# Patient Record
Sex: Female | Born: 1945
Health system: Southern US, Community
[De-identification: ages and names within clinical notes are randomized; demographics above are authoritative.]

## PROBLEM LIST (undated history)

## (undated) DIAGNOSIS — M5030 Other cervical disc degeneration, unspecified cervical region: Secondary | ICD-10-CM

## (undated) DIAGNOSIS — F1010 Alcohol abuse, uncomplicated: Secondary | ICD-10-CM

## (undated) DIAGNOSIS — I700 Atherosclerosis of aorta: Secondary | ICD-10-CM

## (undated) DIAGNOSIS — F411 Generalized anxiety disorder: Secondary | ICD-10-CM

## (undated) DIAGNOSIS — I10 Essential (primary) hypertension: Secondary | ICD-10-CM

## (undated) DIAGNOSIS — N289 Disorder of kidney and ureter, unspecified: Secondary | ICD-10-CM

## (undated) DIAGNOSIS — T7840XA Allergy, unspecified, initial encounter: Secondary | ICD-10-CM

## (undated) DIAGNOSIS — M1990 Unspecified osteoarthritis, unspecified site: Secondary | ICD-10-CM

## (undated) DIAGNOSIS — K289 Gastrojejunal ulcer, unspecified as acute or chronic, without hemorrhage or perforation: Secondary | ICD-10-CM

## (undated) DIAGNOSIS — S37009A Unspecified injury of unspecified kidney, initial encounter: Secondary | ICD-10-CM

## (undated) DIAGNOSIS — F1910 Other psychoactive substance abuse, uncomplicated: Secondary | ICD-10-CM

## (undated) DIAGNOSIS — D509 Iron deficiency anemia, unspecified: Secondary | ICD-10-CM

## (undated) DIAGNOSIS — E039 Hypothyroidism, unspecified: Secondary | ICD-10-CM

## (undated) DIAGNOSIS — K922 Gastrointestinal hemorrhage, unspecified: Secondary | ICD-10-CM

## (undated) DIAGNOSIS — H269 Unspecified cataract: Secondary | ICD-10-CM

## (undated) HISTORY — PX: GASTRIC BYPASS: SHX52

## (undated) HISTORY — DX: Disorder of kidney and ureter, unspecified: N28.9

## (undated) HISTORY — DX: Essential (primary) hypertension: I10

## (undated) HISTORY — DX: Unspecified osteoarthritis, unspecified site: M19.90

## (undated) HISTORY — DX: Alcohol abuse, uncomplicated: F10.10

## (undated) HISTORY — PX: DIAGNOSTIC LAPAROSCOPY: SUR761

## (undated) HISTORY — DX: Generalized anxiety disorder: F41.1

## (undated) HISTORY — DX: Unspecified injury of unspecified kidney, initial encounter: S37.009A

## (undated) HISTORY — DX: Other psychoactive substance abuse, uncomplicated: F19.10

## (undated) HISTORY — DX: Unspecified cataract: H26.9

## (undated) HISTORY — PX: ABDOMINAL HYSTERECTOMY: SHX81

## (undated) HISTORY — PX: CATARACT EXTRACTION: SUR2

## (undated) HISTORY — DX: Allergy, unspecified, initial encounter: T78.40XA

## (undated) HISTORY — PX: SPINE SURGERY: SHX786

## (undated) HISTORY — DX: Gastrojejunal ulcer, unspecified as acute or chronic, without hemorrhage or perforation: K28.9

## (undated) HISTORY — PX: FRACTURE SURGERY: SHX138

## (undated) HISTORY — PX: COSMETIC SURGERY: SHX468

## (undated) HISTORY — PX: CHOLECYSTECTOMY: SHX55

## (undated) HISTORY — DX: Gastrointestinal hemorrhage, unspecified: K92.2

## (undated) HISTORY — DX: Other cervical disc degeneration, unspecified cervical region: M50.30

---

## 2001-03-21 ENCOUNTER — Encounter: Admission: RE | Admit: 2001-03-21 | Discharge: 2001-06-19 | Payer: Self-pay | Admitting: Surgical Oncology

## 2001-04-18 ENCOUNTER — Ambulatory Visit (HOSPITAL_COMMUNITY): Admission: RE | Admit: 2001-04-18 | Discharge: 2001-04-18 | Payer: Self-pay | Admitting: Internal Medicine

## 2001-04-18 ENCOUNTER — Encounter: Admission: RE | Admit: 2001-04-18 | Discharge: 2001-04-18 | Payer: Self-pay | Admitting: Internal Medicine

## 2001-04-18 ENCOUNTER — Encounter: Payer: Self-pay | Admitting: Internal Medicine

## 2001-11-27 HISTORY — PX: GASTRIC BYPASS: SHX52

## 2012-10-27 ENCOUNTER — Ambulatory Visit (INDEPENDENT_AMBULATORY_CARE_PROVIDER_SITE_OTHER): Payer: Commercial Managed Care - PPO | Admitting: Family Medicine

## 2012-10-27 VITALS — BP 136/83 | HR 87 | Temp 97.6°F | Resp 16 | Ht 65.5 in | Wt 216.0 lb

## 2012-10-27 DIAGNOSIS — R040 Epistaxis: Secondary | ICD-10-CM

## 2012-10-27 NOTE — Progress Notes (Signed)
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 Her first nosebleed began 48 hours prior to arrival and was brief and temporary. She's had 4 subsequent nosebleeds with a severe hemorrhage this morning from the right nostril. She feels like she's had some blood on the left side as well.  She's had no nasal trauma, does not smoke, does not take any anticoagulant medicines. Last medicine she took it might have any effect on this was ibuprofen and she stopped that 4 days ago.  Objective: Calm alert adult woman in no acute distress. Exam of the left nostril reveals hyperemia on the septum but no definite source of bleeding.Marland Kitchen He was cauterized with silver nitrate. Exam of the nose on the right nostril reveals a definite source of bleeding on the septum with a 2-3 mm area of intended clot at the area of the arterial. This was cauterized with silver nitrate and the area remained dry.  Patient observed for 30 minutes, re-examined.    Assessment: epistaxis, controlled now with AgNO3  Plan:  RTC prn

## 2013-01-14 ENCOUNTER — Ambulatory Visit (INDEPENDENT_AMBULATORY_CARE_PROVIDER_SITE_OTHER): Payer: Commercial Managed Care - PPO | Admitting: Emergency Medicine

## 2013-01-14 ENCOUNTER — Ambulatory Visit: Payer: Commercial Managed Care - PPO

## 2013-01-14 VITALS — BP 165/91 | HR 77 | Temp 98.0°F | Resp 18

## 2013-01-14 DIAGNOSIS — M25539 Pain in unspecified wrist: Secondary | ICD-10-CM

## 2013-01-14 DIAGNOSIS — S52599A Other fractures of lower end of unspecified radius, initial encounter for closed fracture: Secondary | ICD-10-CM

## 2013-01-14 DIAGNOSIS — S52501A Unspecified fracture of the lower end of right radius, initial encounter for closed fracture: Secondary | ICD-10-CM

## 2013-01-14 IMAGING — CR DG WRIST COMPLETE 3+V*R*
2 series · 2 of 2 positions shown · non-contrast
Comparison: None.

CLINICAL DATA: Fell today with right wrist pain

RIGHT WRIST - COMPLETE 3+ VIEW

[PA]
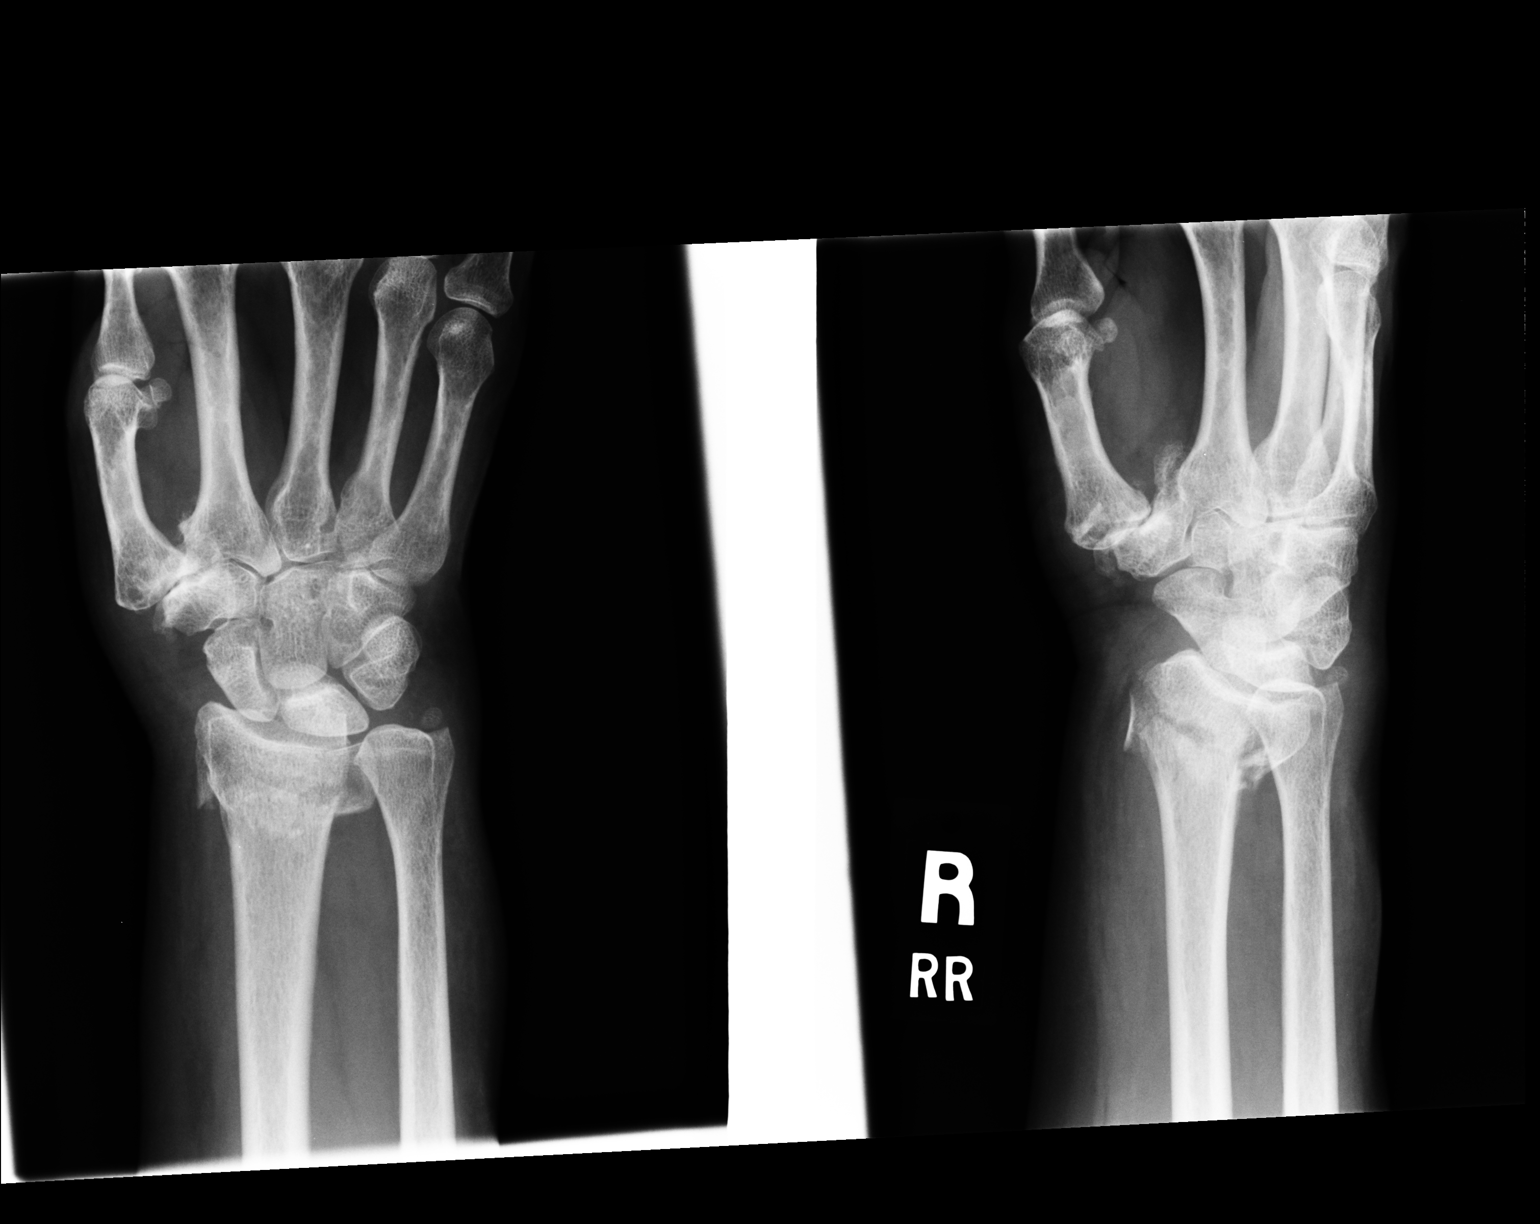

[lateral]
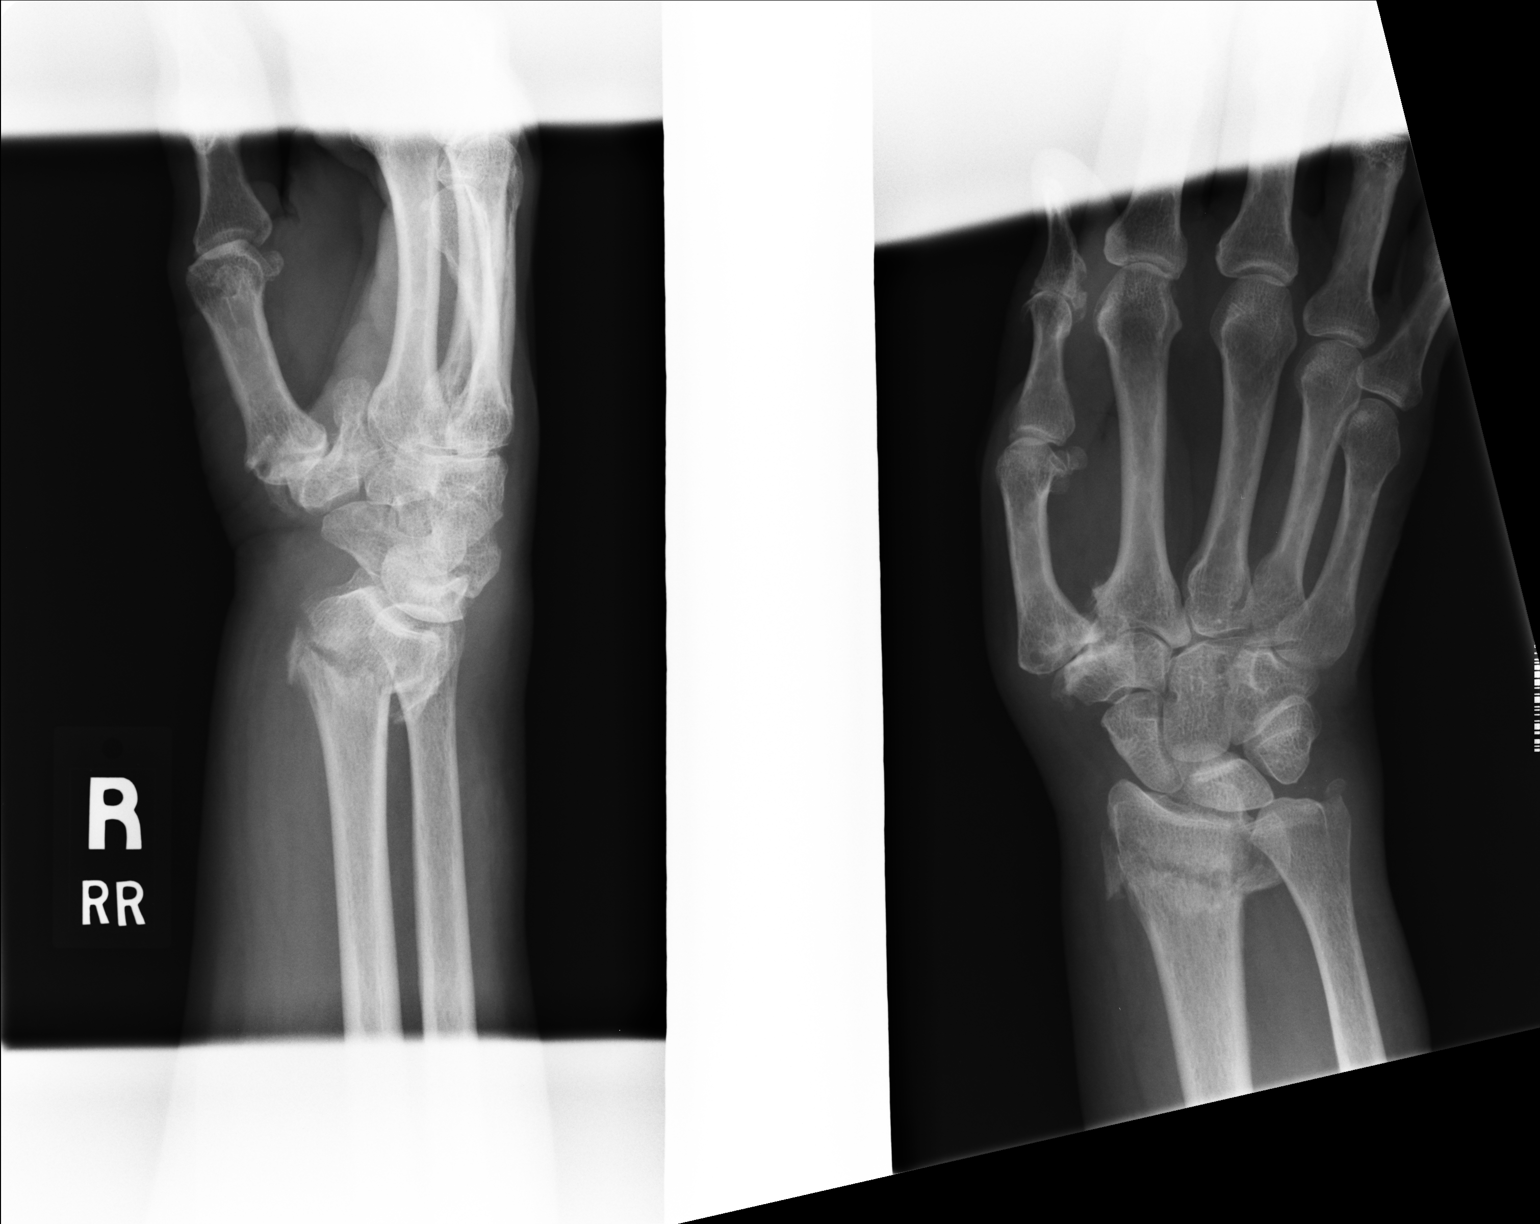

[2 of 2 positions shown; findings below may reference images not displayed]

FINDINGS: There is a comminuted impacted transverse fracture of the
distal right radius with dorsal angulation of the distal fracture
fragment.   This fracture does not appear to extend intra-
articular.  The radiocarpal joint spaces unremarkable.  An old
injury of the ulnar styloid is noted.  The carpal bones are in
normal position.  There is degenerative change at the articulation
of the base of the first metacarpal with the trapezium.
IMPRESSION: Impacted transverse fracture of the distal right radius with dorsal
angulation of the distal fracture fragment.

## 2013-01-14 MED ORDER — HYDROCODONE-ACETAMINOPHEN 5-500 MG PO TABS: 1.0000 | ORAL_TABLET | ORAL | Status: DC | PRN

## 2013-01-14 MED ORDER — NON FORMULARY
7.5000 mg | Freq: Once | Status: AC
  Administered 2013-01-14: 7.5 mg via ORAL

## 2013-01-14 NOTE — Progress Notes (Signed)
[facility] [address] [place] [address] [phone]  Date:  01/14/2013   Name:  ADRIANNAH [name]   DOB:  [birth year]   MRN:  [id number]  PCP:  No primary provider on file.    Chief Complaint: Wrist Injury   History of Present Illness:  [name] is a 67 y.o. very pleasant female patient who presents with the following:  Putting on socks and fell and landed on outstretched right hand.  Has pain, swelling and deformity of wrist.  Not able to use hand  There is no problem list on file for this patient.   Past Medical History  Diagnosis Date  . Hypertension   . Arthritis   . Degenerative disc disease, cervical     Past Surgical History  Procedure Laterality Date  . Gastric bypass      History  Substance Use Topics  . Smoking status: Never Smoker   . Smokeless tobacco: Not on file  . Alcohol Use: 4.2 oz/week    7 Glasses of wine per week    Family History  Problem Relation Age of Onset  . Heart disease Mother   . Stroke Mother   . COPD Father   . Cancer Father     Allergies  Allergen Reactions  . Cocoa Anaphylaxis  . Sulfa Antibiotics Rash    Medication list has been reviewed and updated.  Current Outpatient Prescriptions on File Prior to Visit  Medication Sig Dispense Refill  . benazepril (LOTENSIN) 10 MG tablet Take 10 mg by mouth daily.      . Cholecalciferol (VITAMIN D PO) Take by mouth.      . estrogens, conjugated, (PREMARIN) 1.25 MG tablet Take 1.25 mg by mouth daily.      Marland Kitchen levothyroxine (SYNTHROID, LEVOTHROID) 112 MCG tablet Take 112 mcg by mouth daily.      Marland Kitchen triamterene-hydrochlorothiazide (DYAZIDE) 37.5-25 MG per capsule Take 1 capsule by mouth every morning.      . Pregabalin (LYRICA PO) Take 10 mg by mouth. Pt taking 1  Tablet Tuesday 1 tablet saturday       No current facility-administered medications on file prior to visit.    Review of Systems:  As per HPI, otherwise negative. Marland Kitchen  Physical  Examination: Filed Vitals:   01/14/13 1505  BP: 165/91  Pulse: 77  Temp: 98 F (36.7 C)  Resp: 18   There were no vitals filed for this visit. There is no weight on file to calculate BMI. Ideal Body Weight:     GEN: WDWN, NAD, Non-toxic, Alert & Oriented x 3 HEENT: Atraumatic, Normocephalic.  Ears and Nose: No external deformity. EXTR: No clubbing/cyanosis/edema NEURO: Normal gait.  PSYCH: Normally interactive. Conversant. Not depressed or anxious appearing.  Calm demeanor.  RIGHT WRIST:  Tender guards right wrist.  Some dorsal deformity. NATI  Assessment and Plan: Impacted comminuted fracture distal right radius Sugar tong splint lortab elixir Sling Ortho consult now  Carmelina Dane, MD  [facility] reading (PRIMARY) by  Dr. Dareen Piano.  Comminuted impacted distal radius fracture.

## 2013-01-14 NOTE — Patient Instructions (Addendum)
Dr Ophelia Charter at [facility] ortho will see you now see directions [address]

## 2013-01-15 ENCOUNTER — Encounter (HOSPITAL_COMMUNITY): Payer: Self-pay | Admitting: *Deleted

## 2013-01-15 ENCOUNTER — Ambulatory Visit (HOSPITAL_COMMUNITY)
Admission: AD | Admit: 2013-01-15 | Discharge: 2013-01-15 | Disposition: A | Discharge status: Home / Self Care | Payer: Commercial Managed Care - PPO | Source: Ambulatory Visit | Attending: Orthopaedic Surgery | Admitting: Orthopaedic Surgery

## 2013-01-15 ENCOUNTER — Ambulatory Visit (HOSPITAL_COMMUNITY): Payer: Commercial Managed Care - PPO | Admitting: Anesthesiology

## 2013-01-15 ENCOUNTER — Encounter (HOSPITAL_COMMUNITY): Payer: Self-pay | Admitting: Anesthesiology

## 2013-01-15 ENCOUNTER — Ambulatory Visit (HOSPITAL_COMMUNITY): Payer: Commercial Managed Care - PPO

## 2013-01-15 ENCOUNTER — Encounter (HOSPITAL_COMMUNITY)
Admission: AD | Disposition: A | Discharge status: Home / Self Care | Payer: Self-pay | Source: Ambulatory Visit | Attending: Orthopaedic Surgery

## 2013-01-15 ENCOUNTER — Other Ambulatory Visit (HOSPITAL_COMMUNITY): Payer: Self-pay | Admitting: Orthopaedic Surgery

## 2013-01-15 DIAGNOSIS — I10 Essential (primary) hypertension: Secondary | ICD-10-CM | POA: Insufficient documentation

## 2013-01-15 DIAGNOSIS — W010XXA Fall on same level from slipping, tripping and stumbling without subsequent striking against object, initial encounter: Secondary | ICD-10-CM | POA: Insufficient documentation

## 2013-01-15 DIAGNOSIS — E039 Hypothyroidism, unspecified: Secondary | ICD-10-CM | POA: Insufficient documentation

## 2013-01-15 DIAGNOSIS — Y92009 Unspecified place in unspecified non-institutional (private) residence as the place of occurrence of the external cause: Secondary | ICD-10-CM | POA: Insufficient documentation

## 2013-01-15 DIAGNOSIS — S52599A Other fractures of lower end of unspecified radius, initial encounter for closed fracture: Secondary | ICD-10-CM | POA: Insufficient documentation

## 2013-01-15 DIAGNOSIS — S52501A Unspecified fracture of the lower end of right radius, initial encounter for closed fracture: Secondary | ICD-10-CM

## 2013-01-15 HISTORY — PX: OPEN REDUCTION INTERNAL FIXATION (ORIF) DISTAL RADIAL FRACTURE: SHX5989

## 2013-01-15 HISTORY — DX: Hypothyroidism, unspecified: E03.9

## 2013-01-15 LAB — URINALYSIS, ROUTINE W REFLEX MICROSCOPIC
Bilirubin Urine: NEGATIVE
Glucose, UA: NEGATIVE mg/dL
Ketones, ur: NEGATIVE mg/dL
Leukocytes, UA: NEGATIVE
Nitrite: NEGATIVE
Protein, ur: NEGATIVE mg/dL
Specific Gravity, Urine: 1.023 (ref 1.005–1.030)
Urobilinogen, UA: 0.2 mg/dL (ref 0.0–1.0)
pH: 5 (ref 5.0–8.0)

## 2013-01-15 LAB — CBC
HCT: 40.2 % (ref 36.0–46.0)
Hemoglobin: 13.3 g/dL (ref 12.0–15.0)
MCH: 35.1 pg — ABNORMAL HIGH (ref 26.0–34.0)
MCHC: 33.1 g/dL (ref 30.0–36.0)
MCV: 106.1 fL — ABNORMAL HIGH (ref 78.0–100.0)
Platelets: 269 10*3/uL (ref 150–400)
RBC: 3.79 MIL/uL — ABNORMAL LOW (ref 3.87–5.11)
RDW: 13.1 % (ref 11.5–15.5)
WBC: 9.5 10*3/uL (ref 4.0–10.5)

## 2013-01-15 LAB — COMPREHENSIVE METABOLIC PANEL
ALT: 14 U/L (ref 0–35)
AST: 17 U/L (ref 0–37)
Albumin: 3.2 g/dL — ABNORMAL LOW (ref 3.5–5.2)
Alkaline Phosphatase: 86 U/L (ref 39–117)
BUN: 17 mg/dL (ref 6–23)
CO2: 28 mEq/L (ref 19–32)
Calcium: 8.7 mg/dL (ref 8.4–10.5)
Chloride: 98 mEq/L (ref 96–112)
Creatinine, Ser: 0.94 mg/dL (ref 0.50–1.10)
GFR calc Af Amer: 72 mL/min — ABNORMAL LOW (ref >90)
GFR calc non Af Amer: 62 mL/min — ABNORMAL LOW (ref >90)
Glucose, Bld: 108 mg/dL — ABNORMAL HIGH (ref 70–99)
Potassium: 4.7 mEq/L (ref 3.5–5.1)
Sodium: 135 mEq/L (ref 135–145)
Total Bilirubin: 0.5 mg/dL (ref 0.3–1.2)
Total Protein: 6.5 g/dL (ref 6.0–8.3)

## 2013-01-15 LAB — URINE MICROSCOPIC-ADD ON

## 2013-01-15 LAB — SURGICAL PCR SCREEN
MRSA, PCR: NEGATIVE
Staphylococcus aureus: NEGATIVE

## 2013-01-15 IMAGING — CR DG CHEST 2V
2 series · 2 of 2 positions shown · non-contrast
Comparison: None.

CLINICAL DATA: Preop for ORIF of distal right radial fracture

CHEST - 2 VIEW

[view not recorded (1 of 2)]
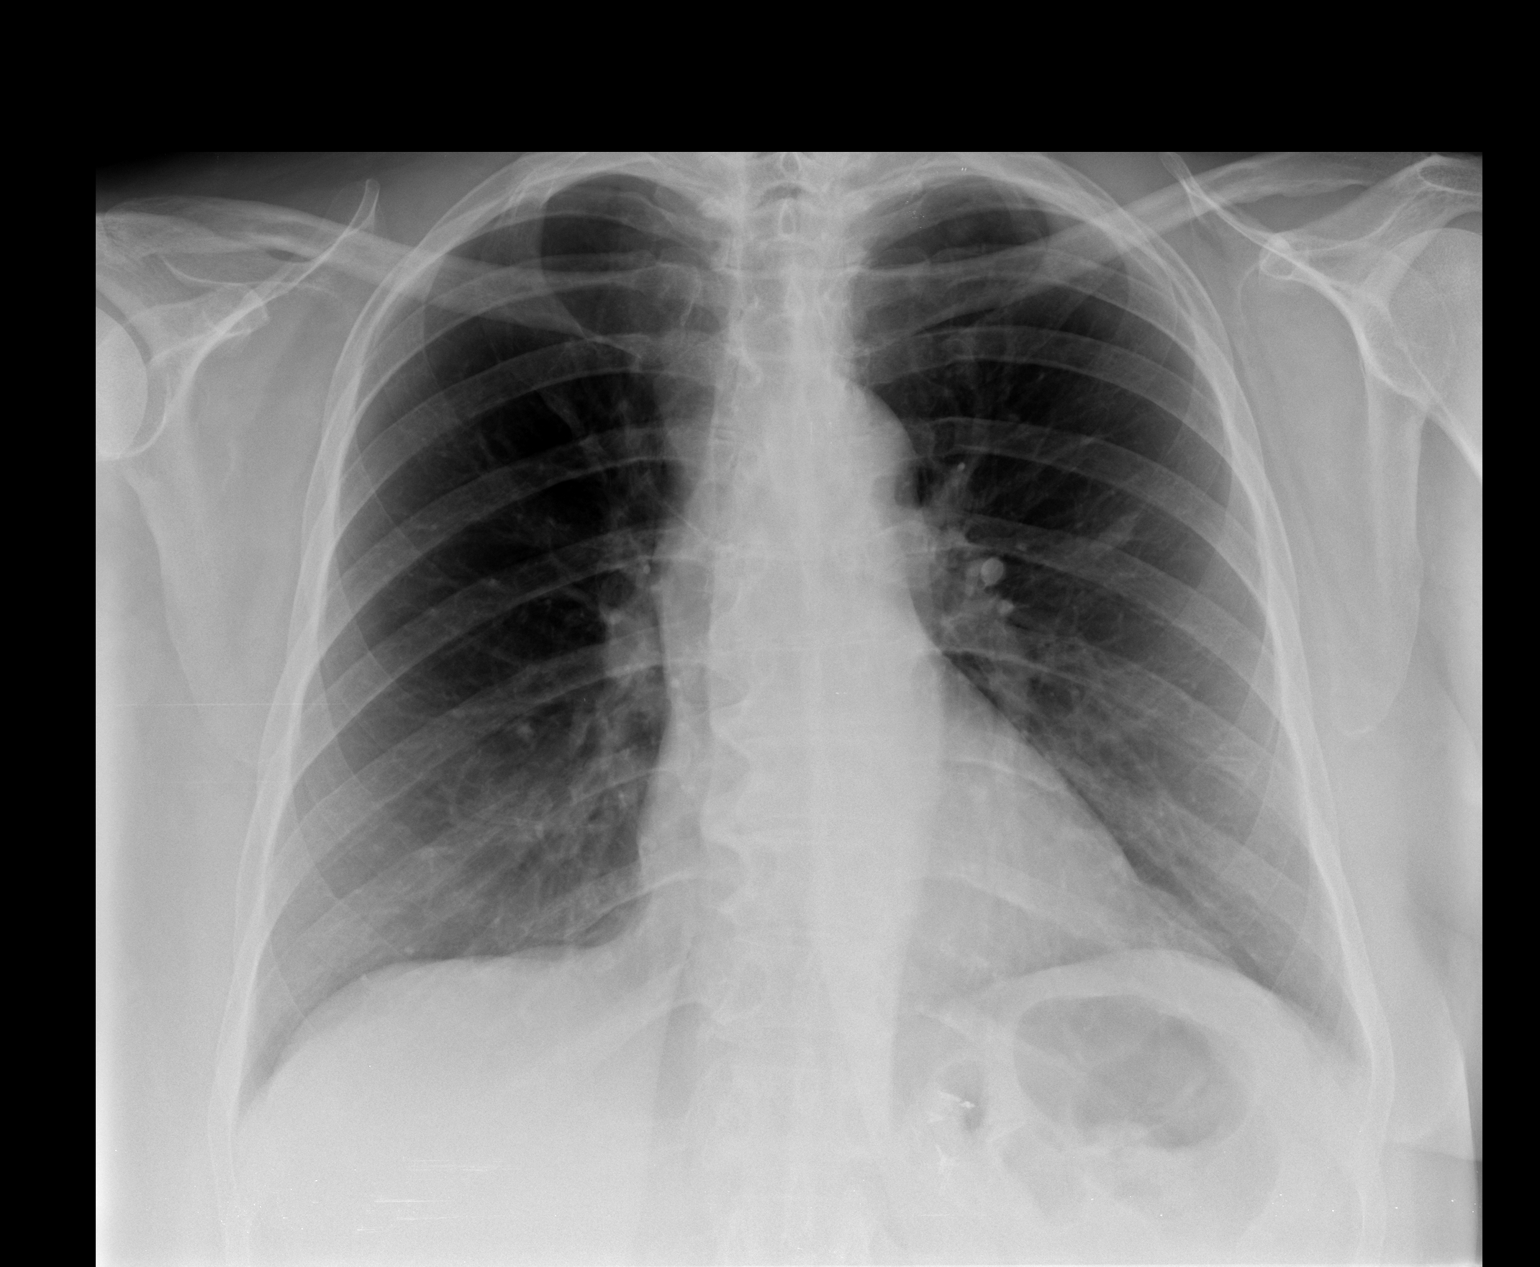

[view not recorded (2 of 2)]
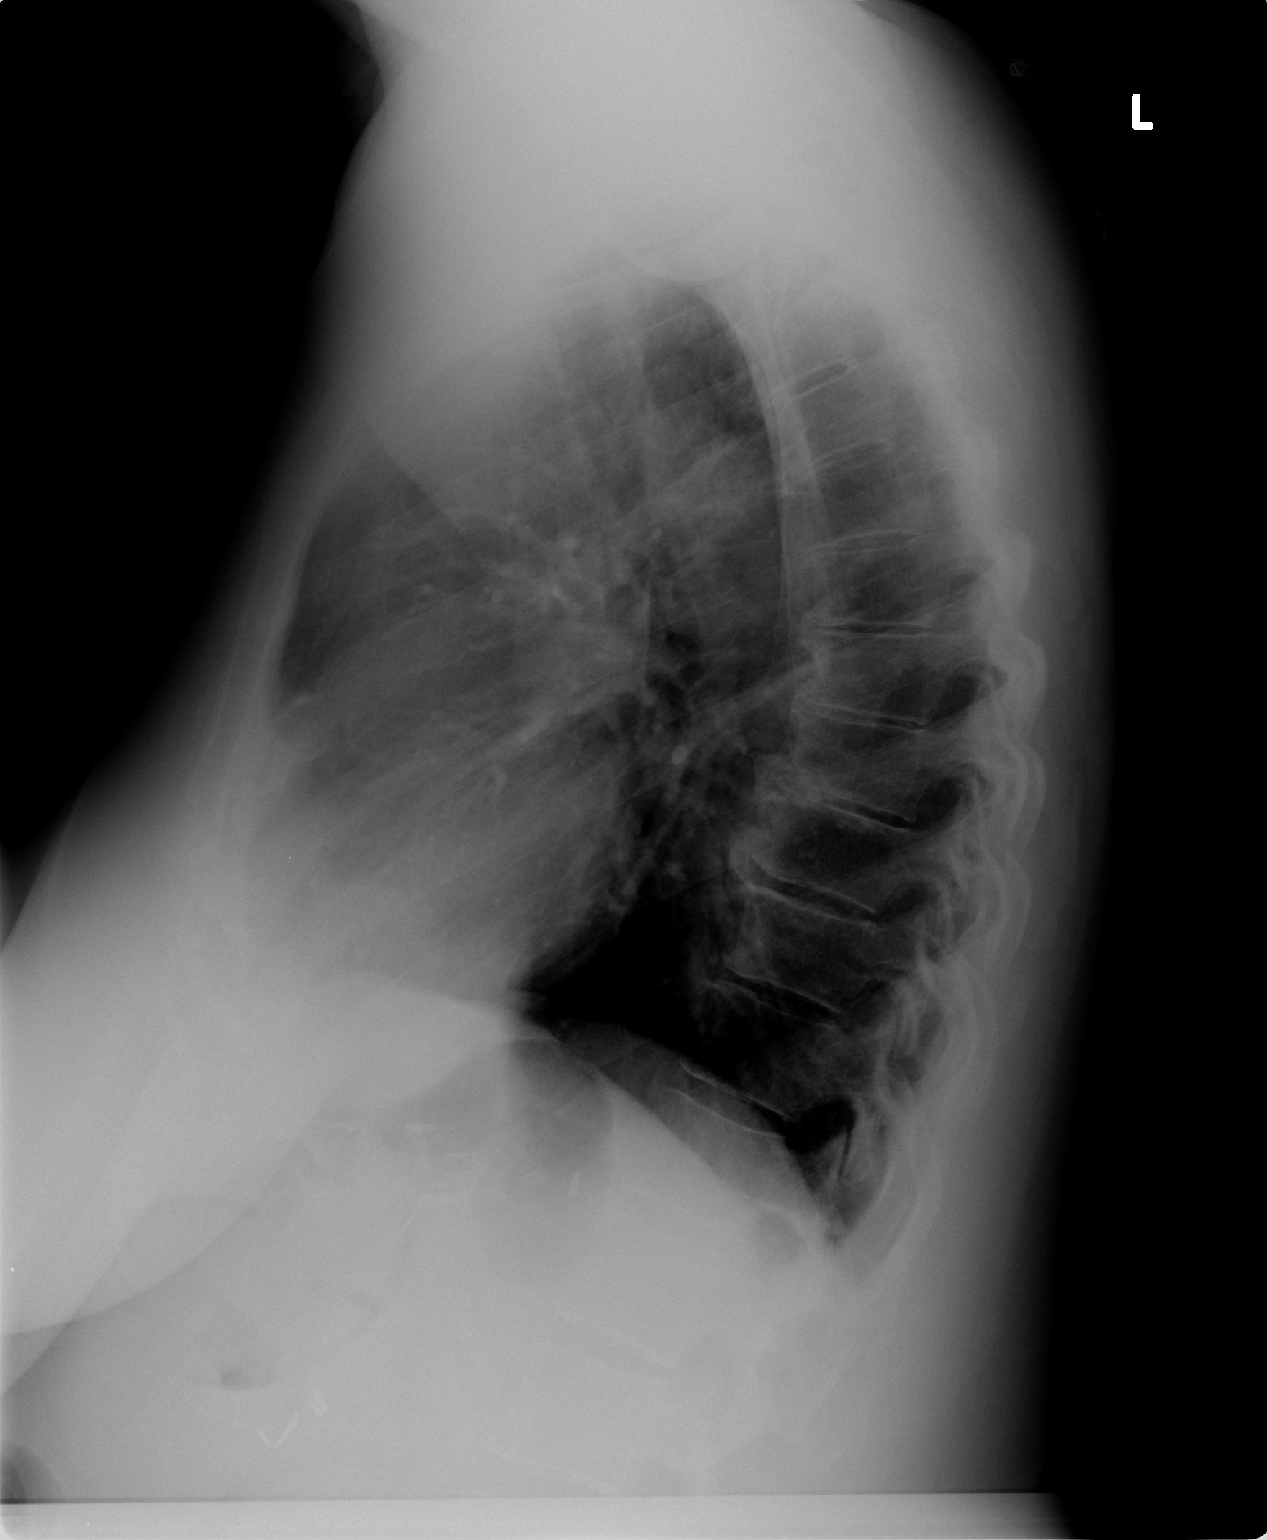

[2 of 2 positions shown; findings below may reference images not displayed]

FINDINGS: No active infiltrate or effusion is seen. There is a
nodular opacity overlying the left mid lung.  This may represent a
bony density overlying the anterior left third rib.  However a lung
lesion cannot be excluded and CT of the chest without IV contrast
media is recommended.  Mediastinal contours appear normal.  The
heart is within upper limits of normal.  There are degenerative
changes throughout the thoracic spine.
IMPRESSION: 1.  No active infiltrate or effusion.
2.  Nodular opacity overlies the left mid lung possibly bony in
origin, but recommend CT chest to assess further.

## 2013-01-15 SURGERY — OPEN REDUCTION INTERNAL FIXATION (ORIF) DISTAL RADIUS FRACTURE
Anesthesia: General | Site: Arm Lower | Laterality: Right | Wound class: Clean

## 2013-01-15 MED ORDER — FENTANYL CITRATE 0.05 MG/ML IJ SOLN
INTRAMUSCULAR | Status: DC | PRN
  Administered 2013-01-15: 100 ug via INTRAVENOUS
  Administered 2013-01-15: 50 ug via INTRAVENOUS

## 2013-01-15 MED ORDER — BUPIVACAINE HCL (PF) 0.25 % IJ SOLN
INTRAMUSCULAR | Status: AC
  Filled 2013-01-15: qty 30

## 2013-01-15 MED ORDER — LIDOCAINE HCL (CARDIAC) 20 MG/ML IV SOLN
INTRAVENOUS | Status: DC | PRN
  Administered 2013-01-15: 100 mg via INTRAVENOUS

## 2013-01-15 MED ORDER — CEFAZOLIN SODIUM-DEXTROSE 2-3 GM-% IV SOLR
INTRAVENOUS | Status: AC
  Administered 2013-01-15: 2 g via INTRAVENOUS
  Filled 2013-01-15: qty 50

## 2013-01-15 MED ORDER — OXYCODONE HCL 5 MG PO TABS
5.0000 mg | ORAL_TABLET | Freq: Once | ORAL | Status: AC | PRN
  Administered 2013-01-15: 5 mg via ORAL

## 2013-01-15 MED ORDER — BUPIVACAINE-EPINEPHRINE PF 0.5-1:200000 % IJ SOLN
INTRAMUSCULAR | Status: DC | PRN
  Administered 2013-01-15: 30 mL

## 2013-01-15 MED ORDER — FENTANYL CITRATE 0.05 MG/ML IJ SOLN
25.0000 ug | INTRAMUSCULAR | Status: DC | PRN
Start: 2013-01-15 — End: 2013-01-16
  Administered 2013-01-15 (×2): 50 ug via INTRAVENOUS

## 2013-01-15 MED ORDER — ONDANSETRON HCL 4 MG/2ML IJ SOLN: 4.0000 mg | Freq: Four times a day (QID) | INTRAMUSCULAR | Status: DC | PRN

## 2013-01-15 MED ORDER — CEFAZOLIN SODIUM-DEXTROSE 2-3 GM-% IV SOLR: 2.0000 g | INTRAVENOUS | Status: DC

## 2013-01-15 MED ORDER — DEXAMETHASONE SODIUM PHOSPHATE 4 MG/ML IJ SOLN
INTRAMUSCULAR | Status: DC | PRN
  Administered 2013-01-15: 4 mg via INTRAVENOUS

## 2013-01-15 MED ORDER — LACTATED RINGERS IV SOLN
INTRAVENOUS | Status: DC | PRN
  Administered 2013-01-15 (×2): via INTRAVENOUS

## 2013-01-15 MED ORDER — BUPIVACAINE HCL (PF) 0.25 % IJ SOLN
INTRAMUSCULAR | Status: DC | PRN
  Administered 2013-01-15: 7 mL

## 2013-01-15 MED ORDER — MUPIROCIN 2 % EX OINT: TOPICAL_OINTMENT | Freq: Two times a day (BID) | CUTANEOUS | Status: DC

## 2013-01-15 MED ORDER — 0.9 % SODIUM CHLORIDE (POUR BTL) OPTIME
TOPICAL | Status: DC | PRN
  Administered 2013-01-15: 1000 mL

## 2013-01-15 MED ORDER — DEXMEDETOMIDINE HCL IN NACL 200 MCG/50ML IV SOLN
0.2000 ug/kg/h | INTRAVENOUS | Status: DC
  Filled 2013-01-15: qty 50

## 2013-01-15 MED ORDER — FENTANYL CITRATE 0.05 MG/ML IJ SOLN
INTRAMUSCULAR | Status: AC
  Filled 2013-01-15: qty 2

## 2013-01-15 MED ORDER — OXYCODONE HCL 5 MG/5ML PO SOLN: 5.0000 mg | Freq: Once | ORAL | Status: AC | PRN

## 2013-01-15 MED ORDER — MIDAZOLAM HCL 2 MG/2ML IJ SOLN
INTRAMUSCULAR | Status: AC
  Administered 2013-01-15: 2 mg
  Filled 2013-01-15: qty 2

## 2013-01-15 MED ORDER — MIDAZOLAM HCL 5 MG/5ML IJ SOLN
INTRAMUSCULAR | Status: DC | PRN
  Administered 2013-01-15: 2 mg via INTRAVENOUS

## 2013-01-15 MED ORDER — ARTIFICIAL TEARS OP OINT
TOPICAL_OINTMENT | OPHTHALMIC | Status: DC | PRN
  Administered 2013-01-15: 1 via OPHTHALMIC

## 2013-01-15 MED ORDER — OXYCODONE HCL 5 MG PO TABS
ORAL_TABLET | ORAL | Status: AC
  Filled 2013-01-15: qty 1

## 2013-01-15 MED ORDER — PROPOFOL 10 MG/ML IV BOLUS
INTRAVENOUS | Status: DC | PRN
  Administered 2013-01-15: 200 mg via INTRAVENOUS

## 2013-01-15 MED ORDER — LACTATED RINGERS IV SOLN
INTRAVENOUS | Status: DC
  Administered 2013-01-15: 17:00:00 via INTRAVENOUS

## 2013-01-15 MED ORDER — ONDANSETRON HCL 4 MG/2ML IJ SOLN
INTRAMUSCULAR | Status: DC | PRN
  Administered 2013-01-15: 4 mg via INTRAVENOUS

## 2013-01-15 MED ORDER — MUPIROCIN 2 % EX OINT
TOPICAL_OINTMENT | CUTANEOUS | Status: AC
  Filled 2013-01-15: qty 22

## 2013-01-15 MED ORDER — FENTANYL CITRATE 0.05 MG/ML IJ SOLN
INTRAMUSCULAR | Status: AC
  Administered 2013-01-15: 100 ug
  Filled 2013-01-15: qty 2

## 2013-01-15 SURGICAL SUPPLY — 59 items
BANDAGE ELASTIC 4 VELCRO ST LF (GAUZE/BANDAGES/DRESSINGS) ×2 IMPLANT
BIT DRILL 2 FAST STEP (BIT) ×2 IMPLANT
BIT DRILL 2.5X4 QC (BIT) ×2 IMPLANT
BNDG ESMARK 4X9 LF (GAUZE/BANDAGES/DRESSINGS) ×2 IMPLANT
CLOTH BEACON ORANGE TIMEOUT ST (SAFETY) IMPLANT
CORDS BIPOLAR (ELECTRODE) ×2 IMPLANT
COVER SURGICAL LIGHT HANDLE (MISCELLANEOUS) ×2 IMPLANT
DRAPE OEC MINIVIEW 54X84 (DRAPES) ×2 IMPLANT
DRSG PAD ABDOMINAL 8X10 ST (GAUZE/BANDAGES/DRESSINGS) IMPLANT
DURAPREP 26ML APPLICATOR (WOUND CARE) ×2 IMPLANT
ELECT REM PT RETURN 9FT ADLT (ELECTROSURGICAL) ×2
ELECTRODE REM PT RTRN 9FT ADLT (ELECTROSURGICAL) ×1 IMPLANT
GAUZE XEROFORM 1X8 LF (GAUZE/BANDAGES/DRESSINGS) IMPLANT
GLOVE BIOGEL PI IND STRL 7.5 (GLOVE) ×1 IMPLANT
GLOVE BIOGEL PI IND STRL 8 (GLOVE) ×1 IMPLANT
GLOVE BIOGEL PI INDICATOR 7.5 (GLOVE) ×1
GLOVE BIOGEL PI INDICATOR 8 (GLOVE) ×1
GLOVE ECLIPSE 7.0 STRL STRAW (GLOVE) ×2 IMPLANT
GLOVE ORTHO TXT STRL SZ7.5 (GLOVE) ×2 IMPLANT
GOWN PREVENTION PLUS LG XLONG (DISPOSABLE) IMPLANT
GOWN PREVENTION PLUS XLARGE (GOWN DISPOSABLE) ×4 IMPLANT
GOWN STRL NON-REIN LRG LVL3 (GOWN DISPOSABLE) IMPLANT
K-WIRE 1.4X100 (WIRE)
K-WIRE 1.6 (WIRE) ×2
K-WIRE FX5X1.6XNS BN SS (WIRE) ×2
KIT BASIN OR (CUSTOM PROCEDURE TRAY) ×2 IMPLANT
KIT ROOM TURNOVER OR (KITS) ×2 IMPLANT
KWIRE 1.4X100 (WIRE) IMPLANT
KWIRE FX5X1.6XNS BN SS (WIRE) ×2 IMPLANT
MANIFOLD NEPTUNE II (INSTRUMENTS) ×2 IMPLANT
NEEDLE 22X1 1/2 (OR ONLY) (NEEDLE) IMPLANT
NS IRRIG 1000ML POUR BTL (IV SOLUTION) ×2 IMPLANT
PACK ORTHO EXTREMITY (CUSTOM PROCEDURE TRAY) ×2 IMPLANT
PAD ARMBOARD 7.5X6 YLW CONV (MISCELLANEOUS) ×4 IMPLANT
PAD CAST 4YDX4 CTTN HI CHSV (CAST SUPPLIES) ×1 IMPLANT
PADDING CAST COTTON 4X4 STRL (CAST SUPPLIES) ×1
PEG SUBCHONDRAL SMOOTH 2.0X18 (Peg) ×2 IMPLANT
PEG SUBCHONDRAL SMOOTH 2.0X20 (Peg) ×8 IMPLANT
PEG SUBCHONDRAL SMOOTH 2.0X22 (Peg) ×2 IMPLANT
PLATE SHORT 24.4X51.3 RT (Plate) ×2 IMPLANT
SCREW PEG LOCK 2.5X12 (Screw) ×2 IMPLANT
SPLINT FIBERGLASS 4X30 (CAST SUPPLIES) ×2 IMPLANT
SPONGE GAUZE 4X4 12PLY (GAUZE/BANDAGES/DRESSINGS) ×4 IMPLANT
SPONGE LAP 4X18 X RAY DECT (DISPOSABLE) IMPLANT
STAPLER VISISTAT 35W (STAPLE) ×2 IMPLANT
STRIP CLOSURE SKIN 1/2X4 (GAUZE/BANDAGES/DRESSINGS) IMPLANT
SUCTION FRAZIER TIP 10 FR DISP (SUCTIONS) ×2 IMPLANT
SUT PROLENE 3 0 PS 1 (SUTURE) IMPLANT
SUT VIC AB 2-0 CT1 27 (SUTURE) ×1
SUT VIC AB 2-0 CT1 TAPERPNT 27 (SUTURE) ×1 IMPLANT
SUT VIC AB 3-0 X1 27 (SUTURE) IMPLANT
SUT VICRYL 4-0 PS2 18IN ABS (SUTURE) IMPLANT
SYR CONTROL 10ML LL (SYRINGE) IMPLANT
TOWEL OR 17X24 6PK STRL BLUE (TOWEL DISPOSABLE) IMPLANT
TOWEL OR 17X26 10 PK STRL BLUE (TOWEL DISPOSABLE) ×4 IMPLANT
TUBE CONNECTING 12X1/4 (SUCTIONS) ×2 IMPLANT
UNDERPAD 30X30 INCONTINENT (UNDERPADS AND DIAPERS) ×2 IMPLANT
WATER STERILE IRR 1000ML POUR (IV SOLUTION) IMPLANT
YANKAUER SUCT BULB TIP NO VENT (SUCTIONS) IMPLANT

## 2013-01-15 NOTE — Anesthesia Procedure Notes (Signed)
Anesthesia Regional Block:  Supraclavicular block  Pre-Anesthetic Checklist: ,, timeout performed, Correct Patient, Correct Site, Correct Laterality, Correct Procedure, Correct Position, site marked, Risks and benefits discussed,  Surgical consent,  Pre-op evaluation,  At surgeon's request and post-op pain management  Laterality: Right  Prep: chloraprep       Needles:  Injection technique: Single-shot  Needle Type: Echogenic Stimulator Needle     Needle Length: 5cm 5 cm Needle Gauge: 22 and 22 G    Additional Needles:  Procedures: ultrasound guided (picture in chart) and nerve stimulator Supraclavicular block  Nerve Stimulator or Paresthesia:  Response: biceps flexion, 0.45 mA,   Additional Responses:   Narrative:  Start time: 01/15/2013 5:19 PM End time: 01/15/2013 5:38 PM Injection made incrementally with aspirations every 5 mL.  Performed by: Personally  Anesthesiologist: Dr Chaney Malling  Additional Notes: Functioning IV was confirmed and monitors were applied.  A 50mm 22ga Arrow echogenic stimulator needle was used. Sterile prep and drape,hand hygiene and sterile gloves were used.  Negative aspiration and negative test dose prior to incremental administration of local anesthetic. The patient tolerated the procedure well.  Ultrasound guidance: relevent anatomy identified, needle position confirmed, local anesthetic spread visualized around nerve(s), vascular puncture avoided.  Image printed for medical record.   Supraclavicular block

## 2013-01-15 NOTE — Transfer of Care (Signed)
Immediate Anesthesia Transfer of Care Note  Patient: [name]  Procedure(s) Performed: Procedure(s): OPEN REDUCTION INTERNAL FIXATION (ORIF) Right DISTAL RADIUS FRACTURE (Right)  Patient Location: PACU  Anesthesia Type:General, Regional and GA combined with regional for post-op pain  Level of Consciousness: awake, alert , oriented and patient cooperative  Airway & Oxygen Therapy: Patient Spontanous Breathing and Patient connected to nasal cannula oxygen  Post-op Assessment: Report given to PACU RN, Post -op Vital signs reviewed and stable and Patient moving all extremities X 4  Post vital signs: Reviewed and stable  Complications: No apparent anesthesia complications

## 2013-01-15 NOTE — Progress Notes (Signed)
Pt feels sensation at finger tips, able to move fingers and thumb, states it occasionally feels tingling but she states she has a cervical neck problem and feels it is from that the right arm feels heavy

## 2013-01-15 NOTE — Interval H&P Note (Signed)
History and Physical Interval Note:  01/15/2013 5:39 PM  [name]  has presented today for surgery, with the diagnosis of Right distal radius fracture  The various methods of treatment have been discussed with the patient and family. After consideration of risks, benefits and other options for treatment, the patient has consented to  Procedure(s): OPEN REDUCTION INTERNAL FIXATION (ORIF) Right DISTAL RADIUS FRACTURE (Right) as a surgical intervention .  The patient's history has been reviewed, patient examined, no change in status, stable for surgery.  I have reviewed the patient's chart and labs.  Questions were answered to the patient's satisfaction.     Gregery Walberg C

## 2013-01-15 NOTE — H&P (Signed)
[name] is an 67 y.o. female.   Chief Complaint: fall with right displaced intra-articular distal radius fracture  HPI:  Putting on socks standing and slipped falling on outstretched right hand with above closed injury  Past Medical History  Diagnosis Date  . Hypertension   . Arthritis   . Degenerative disc disease, cervical   . Hypothyroidism     Past Surgical History  Procedure Laterality Date  . Gastric bypass    . Diagnostic laparoscopy      Family History  Problem Relation Age of Onset  . Heart disease Mother   . Stroke Mother   . COPD Father   . Cancer Father    Social History:  reports that she has never smoked. She does not have any smokeless tobacco history on file. She reports that she drinks about 4.2 ounces of alcohol per week. She reports that she does not use illicit drugs.  Allergies:  Allergies  Allergen Reactions  . Cocoa Anaphylaxis  . Red Dye Hives    Rash and Nausea diarrhea  . Sulfa Antibiotics Rash    Medications Prior to Admission  Medication Sig Dispense Refill  . benazepril (LOTENSIN) 10 MG tablet Take 10 mg by mouth daily.      . Cholecalciferol (VITAMIN D PO) Take by mouth.      . estrogens, conjugated, (PREMARIN) 1.25 MG tablet Take 1.25 mg by mouth daily.      Marland Kitchen HYDROcodone-acetaminophen (VICODIN) 5-500 MG per tablet Take 1-2 tablets by mouth every 4 (four) hours as needed for pain.  30 tablet  0  . levothyroxine (SYNTHROID, LEVOTHROID) 112 MCG tablet Take 112 mcg by mouth daily.      . Pregabalin (LYRICA PO) Take 10 mg by mouth. Pt taking 1  Tablet Tuesday 1 tablet saturday      . triamterene-hydrochlorothiazide (DYAZIDE) 37.5-25 MG per capsule Take 1 capsule by mouth every morning.        Results for orders placed during the hospital encounter of 01/15/13 (from the past 48 hour(s))  CBC     Status: Abnormal   Collection Time    01/15/13  3:16 PM      Result Value Range   WBC 9.5  4.0 - 10.5 K/uL   RBC 3.79 (*) 3.87 - 5.11  MIL/uL   Hemoglobin 13.3  12.0 - 15.0 g/dL   HCT 16.1  09.6 - 04.5 %   MCV 106.1 (*) 78.0 - 100.0 fL   MCH 35.1 (*) 26.0 - 34.0 pg   MCHC 33.1  30.0 - 36.0 g/dL   RDW 40.9  81.1 - 91.4 %   Platelets 269  150 - 400 K/uL   Dg Wrist Complete Right  01/14/2013  *RADIOLOGY REPORT*  Clinical Data: Larey Seat today with right wrist pain  RIGHT WRIST - COMPLETE 3+ VIEW  Comparison: None.  Findings: There is a comminuted impacted transverse fracture of the distal right radius with dorsal angulation of the distal fracture fragment.   This fracture does not appear to extend intra- articular.  The radiocarpal joint spaces unremarkable.  An old injury of the ulnar styloid is noted.  The carpal bones are in normal position.  There is degenerative change at the articulation of the base of the first metacarpal with the trapezium.  IMPRESSION: Impacted transverse fracture of the distal right radius with dorsal angulation of the distal fracture fragment.   Original Report Authenticated By: Dwyane Dee, M.D.     Review of Systems  Constitutional: Negative.   HENT: Negative.   Eyes:       Glasses  Respiratory: Negative.   Cardiovascular:       HTN  Gastrointestinal: Negative.   Neurological: Negative.   Psychiatric/Behavioral: Negative.     Blood pressure 148/71, pulse 80, temperature 97.4 F (36.3 C), temperature source Oral, resp. rate 18, height 5\' 6"  (1.676 m), weight 97.977 kg (216 lb), SpO2 99.00%. Physical Exam  Constitutional: She is oriented to person, place, and time. She appears well-developed and well-nourished.  HENT:  Head: Normocephalic and atraumatic.  Eyes: Pupils are equal, round, and reactive to light.  Neck: Normal range of motion.  Cardiovascular: Normal rate.   Respiratory: Effort normal.  Musculoskeletal:  Right distal arm and wrist swelling , compartments soft in forearm, median nerve function intact.   Neurological: She is alert and oriented to person, place, and time. No cranial  nerve deficit.  Skin: Skin is warm and dry.  Psychiatric: She has a normal mood and affect. Her behavior is normal. Judgment and thought content normal.     Assessment/Plan Right distal radius fracture with intra-articular extension and displacement with shortening and angulation.   Plan ORIF right distal radius, risks discussed, procedure discussed. Husband at bedside, she requests we proceed.  YATES,MARK C 01/15/2013, 3:38 PM

## 2013-01-15 NOTE — Preoperative (Signed)
Beta Blockers   Reason not to administer Beta Blockers:Not Applicable 

## 2013-01-15 NOTE — Anesthesia Preprocedure Evaluation (Signed)
Anesthesia Evaluation  Patient identified by MRN, date of birth, ID band Patient awake    Reviewed: Allergy & Precautions, H&P , NPO status , Patient's Chart, lab work & pertinent test results  Airway Mallampati: II  Neck ROM: full    Dental   Pulmonary          Cardiovascular hypertension,     Neuro/Psych    GI/Hepatic   Endo/Other  Hypothyroidism obese  Renal/GU      Musculoskeletal  (+) Arthritis -,   Abdominal   Peds  Hematology   Anesthesia Other Findings   Reproductive/Obstetrics                           Anesthesia Physical Anesthesia Plan  ASA: II  Anesthesia Plan: General and Regional   Post-op Pain Management: MAC Combined w/ Regional for Post-op pain   Induction: Intravenous  Airway Management Planned: LMA  Additional Equipment:   Intra-op Plan:   Post-operative Plan:   Informed Consent: I have reviewed the patients History and Physical, chart, labs and discussed the procedure including the risks, benefits and alternatives for the proposed anesthesia with the patient or authorized representative who has indicated his/her understanding and acceptance.     Plan Discussed with: CRNA and Surgeon  Anesthesia Plan Comments:         Anesthesia Quick Evaluation

## 2013-01-15 NOTE — Anesthesia Postprocedure Evaluation (Signed)
  Anesthesia Post-op Note  Patient: [name]  Procedure(s) Performed: Procedure(s): OPEN REDUCTION INTERNAL FIXATION (ORIF) Right DISTAL RADIUS FRACTURE (Right)  Patient Location: PACU  Anesthesia Type:General and GA combined with regional for post-op pain  Level of Consciousness: awake, alert  and oriented  Airway and Oxygen Therapy: Patient Spontanous Breathing and Patient connected to nasal cannula oxygen  Post-op Pain: mild  Post-op Assessment: Post-op Vital signs reviewed, Patient's Cardiovascular Status Stable, Respiratory Function Stable, Patent Airway and Pain level controlled  Post-op Vital Signs: stable  Complications: No apparent anesthesia complications

## 2013-01-15 NOTE — Brief Op Note (Signed)
01/15/2013  8:14 PM  PATIENT:  [name]  67 y.o. female  PRE-OPERATIVE DIAGNOSIS:  Right distal radius fracture  POST-OPERATIVE DIAGNOSIS:  Right distal radius fracture  PROCEDURE:  Procedure(s): OPEN REDUCTION INTERNAL FIXATION (ORIF) Right DISTAL RADIUS FRACTURE (Right)  SURGEON:  Surgeon(s) and Role:    * Eldred Manges, MD - Primary  PHYSICIAN ASSISTANT:   ASSISTANTS: none   ANESTHESIA:   local, regional and general  EBL:  Total I/O In: 1100 [I.V.:1100] Out: -   BLOOD ADMINISTERED:none  DRAINS: none   LOCAL MEDICATIONS USED:  MARCAINE     SPECIMEN:  No Specimen  DISPOSITION OF SPECIMEN:  N/A  COUNTS:  YES  TOURNIQUET:   Total Tourniquet Time Documented: Upper Arm (Right) - 159 minutes Total: Upper Arm (Right) - 159 minutes   DICTATION: .Other Dictation: Dictation Number [id number]  PLAN OF CARE: Discharge to home after PACU  PATIENT DISPOSITION:  PACU - hemodynamically stable.   Delay start of Pharmacological VTE agent (>24hrs) due to surgical blood loss or risk of bleeding: not applicable

## 2013-01-16 MED FILL — Mupirocin Oint 2%: CUTANEOUS | Qty: 22 | Status: AC

## 2013-01-16 NOTE — Op Note (Signed)
NAMEJORGE, [name]             ACCOUNT NO.:  [id number]  MEDICAL RECORD NO.:  [id number]  LOCATION:  [facility]                         FACILITY:  [facility]  PHYSICIAN:  Daizha Anand C. Ophelia Charter, M.D.    DATE OF BIRTH:  [birth year]  DATE OF PROCEDURE:  01/15/2013 DATE OF DISCHARGE:  01/15/2013                              OPERATIVE REPORT   PREOPERATIVE DIAGNOSIS:  Displaced intra-articular right distal radius fracture.  POSTOPERATIVE DIAGNOSIS:  Displaced intra-articular right distal radius fracture.  PROCEDURE:  ORIF right distal radius with fixation of 2 distal fragments.  IMPLANT:  Biomet volar DVR plate, standard right.  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  Standard prepping and draping was performed with the proximal arm tourniquet.  DuraPrep was used.  Preoperative Ancef was given prophylactically 2 g. Stockinette extremity sheets and drapes were applied.  Sterile skin marker was made.  Arm was wrapped in Esmarch, tourniquet inflated. Incision was made over the FCR tendon.  Posterior tendon was split and the pronator was pulled from the radial aspect for the ulna exposing the fracture site, evacuated the hematoma.  Reduction was performed.  Ulnar styloid was intact and once reduction was obtained, checked under fluoroscopy.  Diamond pin was placed from distal to proximal through the ulnar styloid across the fracture site, catching the opposite cortex which held the fracture.  Standard plate was inserted, K-wires placed, it was adjusted.  Once it was closed enough, the slotted hole was drilled with the plate slightly too far and distal.  The hole was drilled in the slot more proximal and the plate was gradually slid back until it was adjusted in appropriate position.  Distal K-wire hole was drilled which showed that it did not penetrate the joint.  Combination of 18 and 20 mm smooth pegs were used to the distal row starting on the ulnar aspect and then doing the proximal row styloid was done last. With the wrist, the plate was just barely able to fit on the volar aspect and the threaded styloid screw was a 14.  Two proximal purple cortical screws, 12 mm were placed.  Final AP and lateral x-ray were taken and then 1 tangentially down across the joint from the radial side proved that there was no penetration through the joint.  Position was satisfactory.  The patient tolerated the procedure well and was transferred to the recovery room in stable condition.  Instrument count and needle count was correct.  Standard closure with 2-0 Vicryl in subcutaneous tissue reapproximation, running 3-0 Vicryl suture. Tincture of benzoin, Steri-Strips, Marcaine infiltration, and then postop splint with twiners, 4x4s after Xeroform, Webril and fiberglass splint with Ace wrap.    [name] C. Ophelia Charter, M.D.    [facility]MEDQ  D:  01/15/2013  T:  01/16/2013  Job:  [id number]

## 2013-01-17 ENCOUNTER — Encounter (HOSPITAL_COMMUNITY): Payer: Self-pay | Admitting: Orthopaedic Surgery

## 2013-01-22 NOTE — Progress Notes (Signed)
Reviewed and agree.

## 2013-07-02 ENCOUNTER — Other Ambulatory Visit: Payer: Self-pay

## 2013-10-02 ENCOUNTER — Other Ambulatory Visit: Payer: Self-pay

## 2013-12-23 ENCOUNTER — Ambulatory Visit (INDEPENDENT_AMBULATORY_CARE_PROVIDER_SITE_OTHER): Payer: Commercial Managed Care - PPO | Admitting: Physician Assistant

## 2013-12-23 VITALS — BP 142/82 | HR 82 | Temp 98.1°F | Resp 18 | Ht 66.0 in | Wt 215.6 lb

## 2013-12-23 DIAGNOSIS — R3915 Urgency of urination: Secondary | ICD-10-CM

## 2013-12-23 DIAGNOSIS — N390 Urinary tract infection, site not specified: Secondary | ICD-10-CM

## 2013-12-23 LAB — POCT UA - MICROSCOPIC ONLY
Crystals, Ur, HPF, POC: NEGATIVE
Mucus, UA: POSITIVE
Yeast, UA: NEGATIVE

## 2013-12-23 LAB — POCT URINALYSIS DIPSTICK
Bilirubin, UA: NEGATIVE
Glucose, UA: NEGATIVE
Nitrite, UA: POSITIVE
Spec Grav, UA: 1.025
Urobilinogen, UA: 0.2
pH, UA: 5.5

## 2013-12-23 MED ORDER — CIPROFLOXACIN HCL 250 MG PO TABS: 250.0000 mg | ORAL_TABLET | Freq: Two times a day (BID) | ORAL | Status: DC

## 2013-12-23 NOTE — Patient Instructions (Signed)
Begin taking the antibiotics as directed.  Be sure to finish the full course.  Continue drinking plenty of fluids - water is best!  OTC Azo can help relieve some of the irritative symptoms (beware it does turn your urine a dark orange color!) - may make work more comfortable tonight  Please let us know if any symptoms are worsening or not improving  I will be in touch when your final culture results are back   Urinary Tract Infection Urinary tract infections (UTIs) can develop anywhere along your urinary tract. Your urinary tract is your body's drainage system for removing wastes and extra water. Your urinary tract includes two kidneys, two ureters, a bladder, and a urethra. Your kidneys are a pair of bean-shaped organs. Each kidney is about the size of your fist. They are located below your ribs, one on each side of your spine. CAUSES Infections are caused by microbes, which are microscopic organisms, including fungi, viruses, and bacteria. These organisms are so small that they can only be seen through a microscope. Bacteria are the microbes that most commonly cause UTIs. SYMPTOMS  Symptoms of UTIs may vary by age and gender of the patient and by the location of the infection. Symptoms in young women typically include a frequent and intense urge to urinate and a painful, burning feeling in the bladder or urethra during urination. Older women and men are more likely to be tired, shaky, and weak and have muscle aches and abdominal pain. A fever may mean the infection is in your kidneys. Other symptoms of a kidney infection include pain in your back or sides below the ribs, nausea, and vomiting. DIAGNOSIS To diagnose a UTI, your caregiver will ask you about your symptoms. Your caregiver also will ask to provide a urine sample. The urine sample will be tested for bacteria and white blood cells. White blood cells are made by your body to help fight infection. TREATMENT  Typically, UTIs can be  treated with medication. Because most UTIs are caused by a bacterial infection, they usually can be treated with the use of antibiotics. The choice of antibiotic and length of treatment depend on your symptoms and the type of bacteria causing your infection. HOME CARE INSTRUCTIONS  If you were prescribed antibiotics, take them exactly as your caregiver instructs you. Finish the medication even if you feel better after you have only taken some of the medication.  Drink enough water and fluids to keep your urine clear or pale yellow.  Avoid caffeine, tea, and carbonated beverages. They tend to irritate your bladder.  Empty your bladder often. Avoid holding urine for long periods of time.  Empty your bladder before and after sexual intercourse.  After a bowel movement, women should cleanse from front to back. Use each tissue only once. SEEK MEDICAL CARE IF:   You have back pain.  You develop a fever.  Your symptoms do not begin to resolve within 3 days. SEEK IMMEDIATE MEDICAL CARE IF:   You have severe back pain or lower abdominal pain.  You develop chills.  You have nausea or vomiting.  You have continued burning or discomfort with urination. MAKE SURE YOU:   Understand these instructions.  Will watch your condition.  Will get help right away if you are not doing well or get worse. Document Released: 08/23/2005 Document Revised: 05/14/2012 Document Reviewed: 12/22/2011 [facility] CenterExitCare Patient Information 2014 [place], [place].

## 2013-12-23 NOTE — Progress Notes (Signed)
Subjective:    Patient ID: [name], female    DOB: Aug 16, 1946, 68 y.o.   MRN: [id number]  HPI   [name] is a very pleasant 68 yr old female here with concern for UTI.  Symptoms began last night.  Symptoms include dysuria, frequency, urgency, slight hematuria.  Some suprapubic discomfort.  No flank pain, nausea, vomiting.  Possible fever in the night as pt woke up sweating, but no documented fevers today.  Has had UTIs in the past, but it's been several years  Pt works nights in the ICU, has been out for a week with URI, hoping to return to work tonight   Review of Systems  Constitutional: Negative for fever and chills.  Respiratory: Positive for cough.   Cardiovascular: Negative.   Gastrointestinal: Positive for abdominal pain (suprapubic tenderness). Negative for nausea and vomiting.  Genitourinary: Positive for dysuria, urgency, frequency and hematuria. Negative for flank pain and vaginal discharge.  Musculoskeletal: Negative.   Skin: Negative.        Objective:   Physical Exam  Vitals reviewed. Constitutional: She is oriented to person, place, and time. She appears well-developed and well-nourished. No distress.  HENT:  Head: Normocephalic and atraumatic.  Eyes: Conjunctivae are normal. No scleral icterus.  Cardiovascular: Normal rate, regular rhythm and normal heart sounds.   Pulmonary/Chest: Effort normal and breath sounds normal. She has no wheezes. She has no rales.  Abdominal: Soft. There is tenderness in the suprapubic area. There is no rigidity, no rebound, no guarding and no CVA tenderness.  Neurological: She is alert and oriented to person, place, and time.  Skin: Skin is warm and dry.  Psychiatric: She has a normal mood and affect. Her behavior is normal.   Results for orders placed in visit on 12/23/13  POCT URINALYSIS DIPSTICK      Result Value Range   Color, UA amber     Clarity, UA cloudy     Glucose, UA neg     Bilirubin, UA neg     Ketones, UA trace     Spec Grav, UA 1.025     Blood, UA large     pH, UA 5.5     Protein, UA trace     Urobilinogen, UA 0.2     Nitrite, UA positive     Leukocytes, UA moderate (2+)    POCT UA - MICROSCOPIC ONLY      Result Value Range   WBC, Ur, HPF, POC TNTC     RBC, urine, microscopic TNTC     Bacteria, U Microscopic 1+     Mucus, UA positive     Epithelial cells, urine per micros 5-8     Crystals, Ur, HPF, POC neg     Casts, Ur, LPF, POC hyaline     Yeast, UA neg          Assessment & Plan:  UTI (urinary tract infection) - Plan: Urine culture, ciprofloxacin (CIPRO) 250 MG tablet  Urgency of urination - Plan: POCT urinalysis dipstick, POCT UA - Microscopic Only   [name] is a very pleasant 68 yr old female here with UTI.  Will send cx and start empiric treatment with Cipro.  Push fluids.  OTC azo if needed for symptoms tonight while working.  Will adjust therapy if necessary based on cx results.  Pt to call or RTC if worsening or not improving.  Loleta DickerE. Elizabeth Caiya Bettes MHS, PA-C [facility] 1/27/201512:13  PM

## 2013-12-25 LAB — URINE CULTURE: Colony Count: >=100000

## 2015-02-11 ENCOUNTER — Ambulatory Visit (INDEPENDENT_AMBULATORY_CARE_PROVIDER_SITE_OTHER): Payer: Commercial Managed Care - PPO | Admitting: Family Medicine

## 2015-02-11 VITALS — BP 160/98 | HR 63 | Temp 97.8°F | Resp 16 | Ht 65.5 in | Wt 228.0 lb

## 2015-02-11 DIAGNOSIS — I10 Essential (primary) hypertension: Secondary | ICD-10-CM

## 2015-02-11 DIAGNOSIS — E039 Hypothyroidism, unspecified: Secondary | ICD-10-CM | POA: Diagnosis not present

## 2015-02-11 DIAGNOSIS — R2243 Localized swelling, mass and lump, lower limb, bilateral: Secondary | ICD-10-CM | POA: Diagnosis not present

## 2015-02-11 LAB — POCT URINALYSIS DIPSTICK
Bilirubin, UA: NEGATIVE
Glucose, UA: NEGATIVE
Ketones, UA: NEGATIVE
Leukocytes, UA: NEGATIVE
Nitrite, UA: NEGATIVE
Protein, UA: NEGATIVE
Spec Grav, UA: 1.02
Urobilinogen, UA: 1
pH, UA: 6

## 2015-02-11 LAB — COMPREHENSIVE METABOLIC PANEL
ALT: 37 U/L — ABNORMAL HIGH (ref 0–35)
AST: 48 U/L — ABNORMAL HIGH (ref 0–37)
Albumin: 4 g/dL (ref 3.5–5.2)
Alkaline Phosphatase: 86 U/L (ref 39–117)
BUN: 10 mg/dL (ref 6–23)
CO2: 23 mEq/L (ref 19–32)
Calcium: 9.2 mg/dL (ref 8.4–10.5)
Chloride: 95 mEq/L — ABNORMAL LOW (ref 96–112)
Creat: 0.78 mg/dL (ref 0.50–1.10)
Glucose, Bld: 97 mg/dL (ref 70–99)
Potassium: 4.5 mEq/L (ref 3.5–5.3)
Sodium: 131 mEq/L — ABNORMAL LOW (ref 135–145)
Total Bilirubin: 0.7 mg/dL (ref 0.2–1.2)
Total Protein: 6.8 g/dL (ref 6.0–8.3)

## 2015-02-11 MED ORDER — BENAZEPRIL-HYDROCHLOROTHIAZIDE 20-25 MG PO TABS: 1.0000 | ORAL_TABLET | Freq: Every day | ORAL | Status: DC

## 2015-02-11 MED ORDER — FUROSEMIDE 40 MG PO TABS: 40.0000 mg | ORAL_TABLET | Freq: Every day | ORAL | Status: DC

## 2015-02-11 MED ORDER — POTASSIUM CHLORIDE CRYS ER 20 MEQ PO TBCR: 20.0000 meq | EXTENDED_RELEASE_TABLET | Freq: Every day | ORAL | Status: DC

## 2015-02-11 NOTE — Patient Instructions (Signed)
Lymphedema Lymphedema is a swelling caused by the abnormal collection of lymph under the skin. The lymph is fluid from the tissues in your body that travels in the lymphatic system. This system is part of the immune system that includes lymph nodes and vessels. The lymph vessels collect and carry the excess fluid, fats, proteins, and wastes from the tissues of the body to the bloodstream. This system also works to clean and remove bacteria and waste products from the body.  Lymphedema occurs when the lymphatic system is blocked. When the lymph vessels or lymph nodes are blocked or damaged, lymph does not drain properly. This causes abnormal build up of lymph. This leads to swelling in the arms or legs. Lymphedema cannot be cured by medicines. But the swelling can be reduced by physical methods. CAUSES  There are two types of lymphedema. Primary lymphedema is caused by the absence or abnormality of the lymph vessel at birth. It is also known as inherited lymphedema, which occurs rarely. Secondary or acquired lymphedema occurs when the lymph vessel is damaged or blocked. The causes of lymph vessel blockage are:   Skin infection like cellulites.  Infection by parasites (filariasis).  Injury.  Cancer.  Radiation therapy.  Formation of scar tissue.  Surgery. SYMPTOMS  The symptoms of lymphedema are:  Abnormal swelling of the arm or leg.  Heavy or tight feeling in your arm or leg.  Tight-fitting shoes or rings.  Redness of skin over the affected area.  Limited movement of the affected limb.  Some patients complain about sensitivity to touch and discomfort in the limb(s) affected. You may not have these symptoms immediately following injury. They usually appear within a few days or even years after injury. Inform your caregiver, if you have any of these symptoms. Early treatment can avoid further problems.  DIAGNOSIS  First, your caregiver will inquire about any surgery you have had or  medicines you are taking. He will then examine you. Your caregiver may order special imaging tests, such as:  Lymphoscintigraphy (a test in which a low dose of radioactive substance is injected to trace the flow of lymph through the lymph vessels).  MRI (imaging tests using magnetic fields).  Computed tomography (test using special cross-sectional X-rays).  Duplex ultrasound (test using high-frequency sound waves to show the vessels and the blood flow on a screen).  Lymphangiography (special X-ray taken after injecting a contrast dye into the lymph vessel). It is now rarely done. TREATMENT  Lymphedema can be treated in different ways. Your caregiver will decide the type of treatment depending on the cause. Treatment may include:  Exercise: Special exercises will help fluid move out easily from the affected part. This should be done as per your caregiver's advice.  Manual lymph drainage: Gentle massage of the affected limb makes the fluid to move out more freely.  Compression: Compression stockings or external pump apply pressure over the affected limb. This helps the fluid to move out from the arm or leg. Bandaging can also help to move the fluid out from the affected part. Your caregiver will decide the method that suits you the best.  Medicines: Your caregiver may prescribe antibiotics, if you have infection.  Surgery: Your caregiver may advise surgery for severe lymphedema. It is reserved for special cases when the patient has difficulty moving. Your surgeon may remove excess tissue from the arm or leg. This will help to ease your movement. Physical therapy may have to be continued after surgery. HOME CARE INSTRUCTIONS    The area is very fragile and is predisposed to injury and infection.  Eat a healthy diet.  Exercise regularly as per advice.  Keep the affected area clean and dry.  Use gloves while cooking or gardening.  Protect your skin from cuts.  Use electric razor to  shave the affected area.  Keep affected limb elevated.  Do not wear tight clothes, shoes, or jewelry as it may cause the tissue to be strangled.  Do not use heat pads over the affected area.  Do not sit with cross legs.  Do not walk barefoot.  Do not carry weight on the affected arm.  Avoid having blood pressure checked on the affected limb. SEEK MEDICAL CARE IF:  You continue to have swelling in your limb. SEEK IMMEDIATE MEDICAL CARE IF:   You have high fever.  You have skin rash.  You have chills or sweats.  You have pain or redness.  You have a cut that does not heal. MAKE SURE YOU:   Understand these instructions.  Will watch your condition.  Will get help right away if you are not doing well or get worse. Document Released: 09/10/2007 Document Revised: 10/30/2012 Document Reviewed: 08/16/2009 Exit[employer] Patient Information 2015 [employer]. This information is not intended to replace advice given to you by your health care provider. Make sure you discuss any questions you have with your health care provider.  Venous Stasis or Chronic Venous Insufficiency Chronic venous insufficiency, also called venous stasis, is a condition that affects the veins in the legs. The condition prevents blood from being pumped through these veins effectively. Blood may no longer be pumped effectively from the legs back to the heart. This condition can range from mild to severe. With proper treatment, you should be able to continue with an active life. CAUSES  Chronic venous insufficiency occurs when the vein walls become stretched, weakened, or damaged or when valves within the vein are damaged. Some common causes of this include: High blood pressure inside the veins (venous hypertension). Increased blood pressure in the leg veins from long periods of sitting or standing. A blood clot that blocks blood flow in a vein (deep vein thrombosis). Inflammation of a superficial vein  (phlebitis) that causes a blood clot to form. RISK FACTORS Various things can make you more likely to develop chronic venous insufficiency, including: Family history of this condition. Obesity. Pregnancy. Sedentary lifestyle. Smoking. Jobs requiring long periods of standing or sitting in one place. Being a certain age. Women in their 40s and 50s and men in their 70s are more likely to develop this condition. SIGNS AND SYMPTOMS  Symptoms may include:  Varicose veins. Skin breakdown or ulcers. Reddened or discolored skin on the leg. Brown, smooth, tight, and painful skin just above the ankle, usually on the inside surface (lipodermatosclerosis). Swelling. DIAGNOSIS  To diagnose this condition, your health care provider will take a medical history and do a physical exam. The following tests may be ordered to confirm the diagnosis: Duplex ultrasound--A procedure that produces a picture of a blood vessel and nearby organs and also provides information on blood flow through the blood vessel. Plethysmography--A procedure that tests blood flow. A venogram, or venography--A procedure used to look at the veins using X-ray and dye. TREATMENT The goals of treatment are to help you return to an active life and to minimize pain or disability. Treatment will depend on the severity of the condition. Medical procedures may be needed for severe cases. Treatment options may   include:  Use of compression stockings. These can help with symptoms and lower the chances of the problem getting worse, but they do not cure the problem. Sclerotherapy--A procedure involving an injection of a material that "dissolves" the damaged veins. Other veins in the network of blood vessels take over the function of the damaged veins. Surgery to remove the vein or cut off blood flow through the vein (vein stripping or laser ablation surgery). Surgery to repair a valve. HOME CARE INSTRUCTIONS  Wear compression stockings as  directed by your health care provider. Only take over-the-counter or prescription medicines for pain, discomfort, or fever as directed by your health care provider. Follow up with your health care provider as directed. SEEK MEDICAL CARE IF:  You have redness, swelling, or increasing pain in the affected area. You see a red streak or line that extends up or down from the affected area. You have a breakdown or loss of skin in the affected area, even if the breakdown is small. You have an injury to the affected area. SEEK IMMEDIATE MEDICAL CARE IF:  You have an injury and open wound in the affected area. Your pain is severe and does not improve with medicine. You have sudden numbness or weakness in the foot or ankle below the affected area, or you have trouble moving your foot or ankle. You have a fever or persistent symptoms for more than 2-3 days. You have a fever and your symptoms suddenly get worse. MAKE SURE YOU:  Understand these instructions. Will watch your condition. Will get help right away if you are not doing well or get worse. Document Released: 03/19/2007 Document Revised: 09/03/2013 Document Reviewed: 07/21/2013 Exit[employer] Patient Information 2015 [employer]. This information is not intended to replace advice given to you by your health care provider. Make sure you discuss any questions you have with your health care provider.  

## 2015-02-11 NOTE — Progress Notes (Signed)
MRN: [id number] DOB: 11-13-1946  Subjective:   [name] BarriosLynda H [name] is a 69 y.o. female with a pmh of HTN presenting for chief complaint of Edema and Shortness of Breath  Reports 2 week history of bilateral lower leg swelling, weight gain 216-230lbs, abdominal bloating. Also admits shob with increased activity although she has been able to perform ADL's without shob. She has been propping legs up, low sodium diet, taking triamterene-HCT, benazepril. BP has been 130/80 at home. Patient denies chest pain, shob, cough, wheezing, lower leg pain or erythema, n/v, abdominal pain, dizziness, lightheadedness, headache. Reports that she has not been drinking too much water, 1 cup of tea, coffee. Drinks 3 glasses of wine daily. Never smoker. Has previously had swelling like this episode in her arms following surgery, was resolved using diuretics. Denies any other aggravating or relieving factors, no other questions or concerns.  [name] has a current medication list which includes the following prescription(s): benazepril, cholecalciferol, estrogens (conjugated), ibuprofen, levothyroxine, multivitamin, and triamterene-hydrochlorothiazide. She is allergic to cocoa; red dye; lyrica; and sulfa antibiotics.  [name]  has a past medical history of Hypertension; Arthritis; Degenerative disc disease, cervical; Hypothyroidism; Allergy; and Cataract. Also  has past surgical history that includes Gastric bypass; Diagnostic laparoscopy; Open reduction internal fixation (orif) distal radial fracture (Right, 01/15/2013); Cholecystectomy; Cosmetic surgery; and Fracture surgery.  ROS As in subjective.  Objective:   Vitals: BP 160/98 mmHg  Pulse 63  Temp(Src) 97.8 F (36.6 C) (Oral)  Resp 16  Ht 5' 5.5" (1.664 m)  Wt 228 lb (103.42 kg)  BMI 37.35 kg/m2  SpO2 98%  Physical Exam  Constitutional: She is oriented to person, place, and time and well-developed, well-nourished, and in no distress.  Cardiovascular: Normal  rate, regular rhythm and intact distal pulses.  Exam reveals no gallop and no friction rub.   No murmur heard. Pulmonary/Chest: No respiratory distress. She has no wheezes. She has no rales. She exhibits no tenderness.  Abdominal: Soft. Bowel sounds are normal. She exhibits no mass. There is no tenderness.  Musculoskeletal: Normal range of motion. She exhibits edema (2+ pitting edema up to calf of right leg, 1+ pitting edema up to calf of left leg). She exhibits no tenderness.  Negative Homans sign.  Neurological: She is alert and oriented to person, place, and time.  Sensation intact.  Skin: Skin is warm and dry. No rash noted. No erythema. No pallor.  Multiple superficial varicosities over medial thighs bilaterally.   Results for orders placed or performed in visit on 02/11/15 (from the past 24 hour(s))  POCT urinalysis dipstick     Status: None   Collection Time: 02/11/15  1:26 PM  Result Value Ref Range   Color, UA dark orange    Clarity, UA clear    Glucose, UA neg    Bilirubin, UA neg    Ketones, UA neg    Spec Grav, UA 1.020    Blood, UA moderate    pH, UA 6.0    Protein, UA neg    Urobilinogen, UA 1.0    Nitrite, UA neg    Leukocytes, UA Negative    Assessment and Plan :   1. Essential hypertension 2. Localized swelling of both lower legs - Likely due to venous insufficiency but will attempt to r/o cardiac process. Unable to complete EKG and CXR today due to patient's time constraints and having to leave clinic. She agreed to have labs drawn. Will follow up on Saturday 02/13/2015. 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  3. Hypothyroidism, unspecified hypothyroidism type - Per  the patient, she would like to have her thyroid level checked to make sure it is stable  [name], PA-C [facility] [phone] 02/11/2015 12:40 PM

## 2015-02-12 LAB — BRAIN NATRIURETIC PEPTIDE: Brain Natriuretic Peptide: 96.1 pg/mL (ref 0.0–100.0)

## 2015-02-15 ENCOUNTER — Ambulatory Visit (INDEPENDENT_AMBULATORY_CARE_PROVIDER_SITE_OTHER): Payer: Commercial Managed Care - PPO

## 2015-02-15 ENCOUNTER — Ambulatory Visit (INDEPENDENT_AMBULATORY_CARE_PROVIDER_SITE_OTHER): Payer: Commercial Managed Care - PPO | Admitting: Family Medicine

## 2015-02-15 VITALS — BP 144/78 | HR 75 | Temp 98.0°F | Resp 18 | Ht 65.5 in | Wt 223.6 lb

## 2015-02-15 DIAGNOSIS — E039 Hypothyroidism, unspecified: Secondary | ICD-10-CM

## 2015-02-15 DIAGNOSIS — I890 Lymphedema, not elsewhere classified: Secondary | ICD-10-CM

## 2015-02-15 DIAGNOSIS — I10 Essential (primary) hypertension: Secondary | ICD-10-CM | POA: Diagnosis not present

## 2015-02-15 DIAGNOSIS — E871 Hypo-osmolality and hyponatremia: Secondary | ICD-10-CM

## 2015-02-15 LAB — COMPLETE METABOLIC PANEL WITH GFR
ALT: 30 U/L (ref 0–35)
AST: 45 U/L — ABNORMAL HIGH (ref 0–37)
Albumin: 4 g/dL (ref 3.5–5.2)
Alkaline Phosphatase: 99 U/L (ref 39–117)
BUN: 22 mg/dL (ref 6–23)
CO2: 32 mEq/L (ref 19–32)
Calcium: 9.5 mg/dL (ref 8.4–10.5)
Chloride: 91 mEq/L — ABNORMAL LOW (ref 96–112)
Creat: 1.16 mg/dL — ABNORMAL HIGH (ref 0.50–1.10)
GFR, Est African American: 56 mL/min — ABNORMAL LOW
GFR, Est Non African American: 48 mL/min — ABNORMAL LOW
Glucose, Bld: 110 mg/dL — ABNORMAL HIGH (ref 70–99)
Potassium: 4.2 mEq/L (ref 3.5–5.3)
Sodium: 134 mEq/L — ABNORMAL LOW (ref 135–145)
Total Bilirubin: 0.7 mg/dL (ref 0.2–1.2)
Total Protein: 7.2 g/dL (ref 6.0–8.3)

## 2015-02-15 LAB — TSH: TSH: 7.828 u[IU]/mL — ABNORMAL HIGH (ref 0.350–4.500)

## 2015-02-15 LAB — T4, FREE: Free T4: 0.71 ng/dL — ABNORMAL LOW (ref 0.80–1.80)

## 2015-02-15 LAB — T3, FREE: T3, Free: 2.2 pg/mL — ABNORMAL LOW (ref 2.3–4.2)

## 2015-02-15 IMAGING — CR DG CHEST 2V
2 series · 2 of 2 positions shown · non-contrast
Comparison: [DATE]

CLINICAL DATA: Lymphedema -bilateral leg swelling.

EXAM:
CHEST  2 VIEW

[PA]
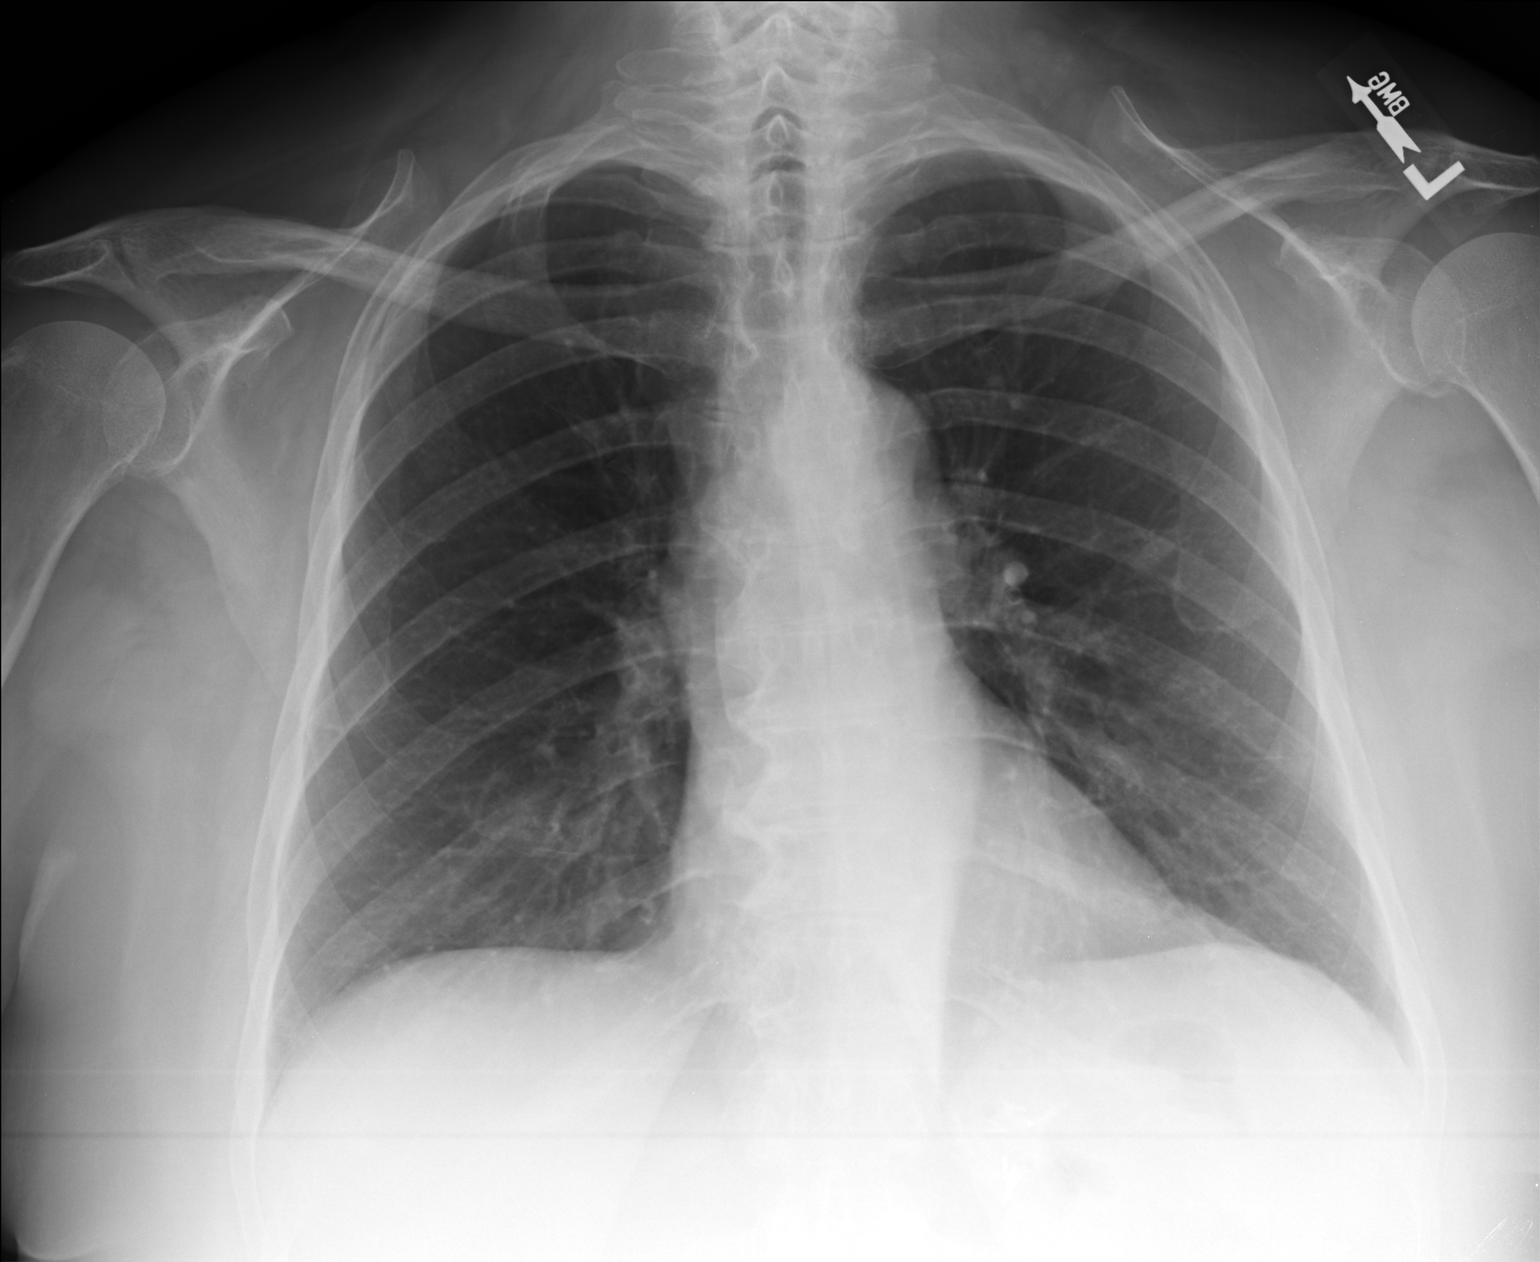

[lateral]
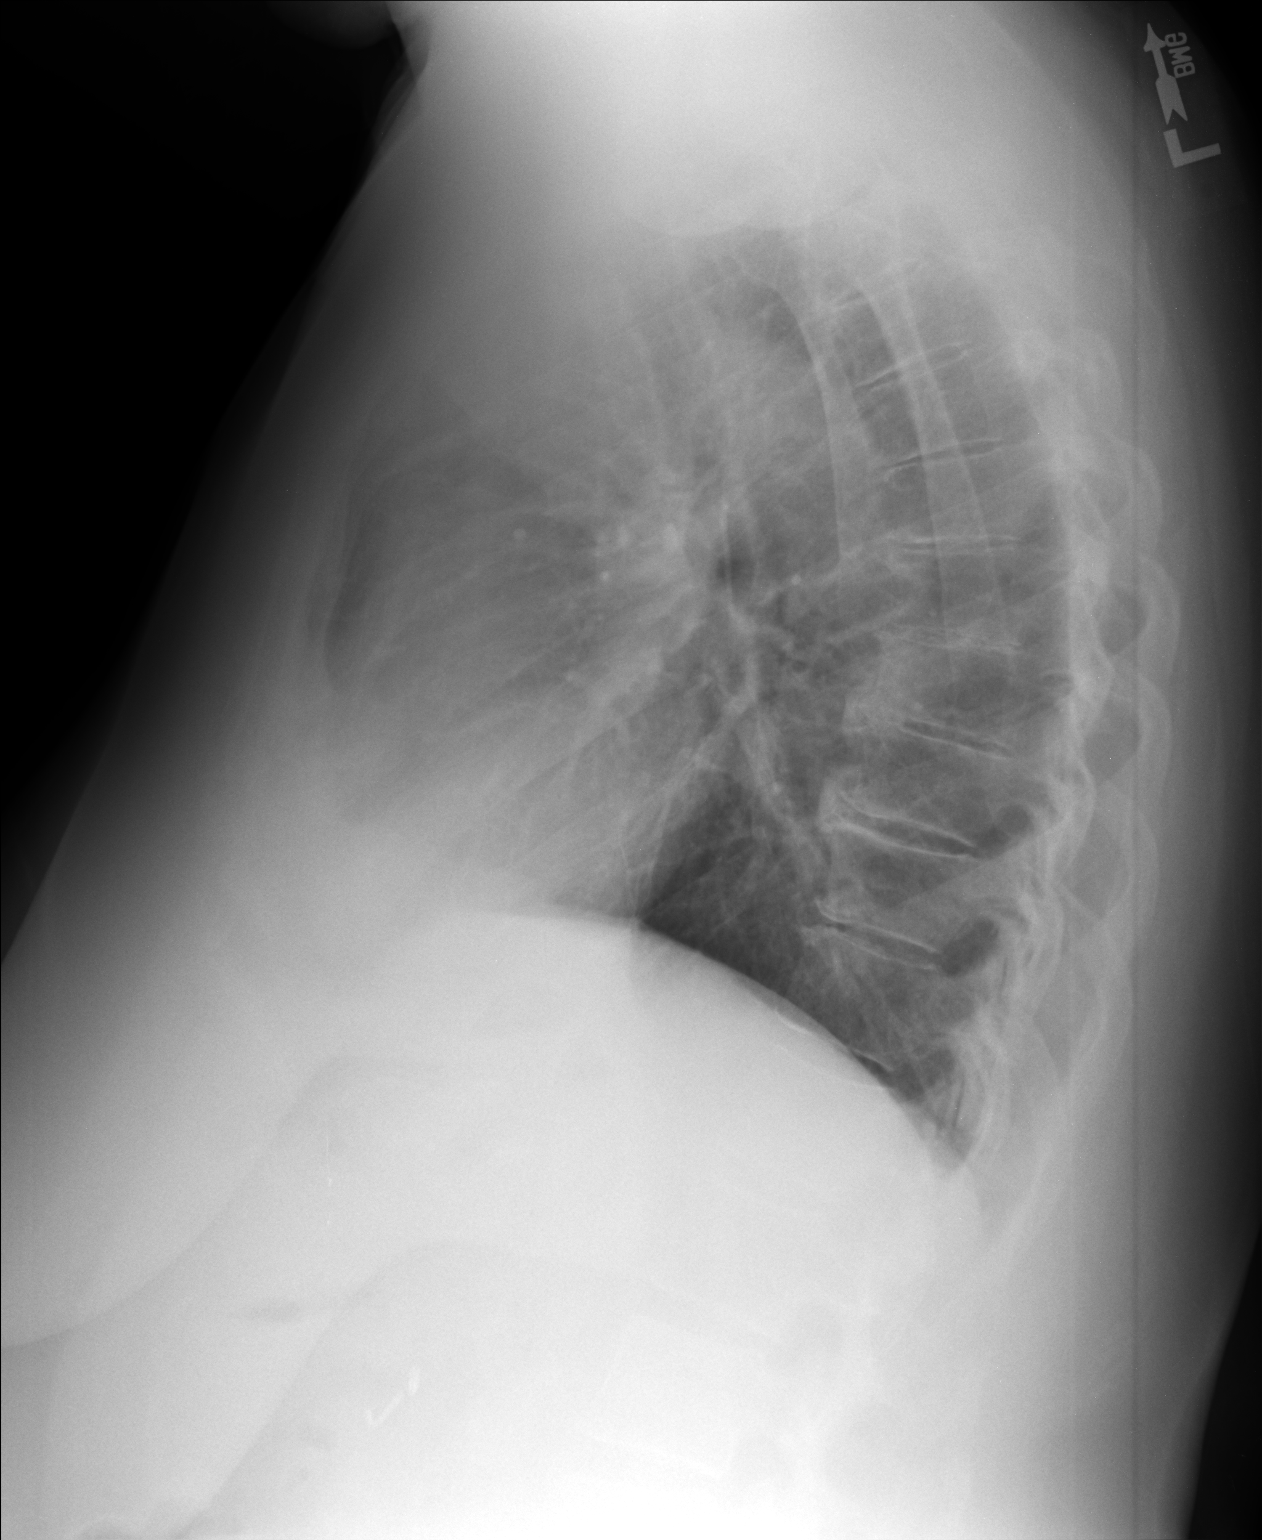

[2 of 2 positions shown; findings below may reference images not displayed]

FINDINGS: No cardiomegaly or failure. There is mild aortic tortuosity which is
stable. No pneumonia, effusion, or pneumothorax.

An ovoid density overlapping the left anterior third rib is
unchanged over 2 years and considered benign -likely a bone island.
Bilateral cervical ribs are noted.
IMPRESSION: 1. No acute cardiopulmonary disease.
2. Stable ovoid density over the left chest, likely bony and benign.

## 2015-02-15 MED ORDER — LEVOTHYROXINE SODIUM 112 MCG PO TABS: 112.0000 ug | ORAL_TABLET | Freq: Every day | ORAL | Status: DC

## 2015-02-15 NOTE — Progress Notes (Signed)
Chief Complaint:  Chief Complaint  Patient presents with  . Follow-up    Edema - lower legs - need EKG; CXR    HPI: [name] is a 69 y.o. female who is here for an EKG, chest x-ray and follow-up on her lymphedema bilaterally. She was seen here several days ago and had to leave before it a full workup was completed by Wallis BambergMario Mani. She was put on 40 mg of Lasix. Hydrochlorothiazide was also added to regimen. She was also given potassium 20 mEq to take but she has not started on that. She has diuresed about 5 pounds. She is worried that her kidneys might be affected since she has only one or maybe 1-1/2 functional kidney. 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 However her urine did not show any protein leakage and her creatinine level was normal. She does have there are callosities in her legs. Her normal weight is about 216. She is a ICU nurse at [employer], works the evening shifts. She is on her feet all the time. She was unable to put compression socks on on her last visit but has been using one of them since then and it helps.   BP Readings from Last 3 Encounters:  02/15/15 144/78  02/11/15 160/98  12/23/13 142/82   Wt Readings from Last 3 Encounters:  02/15/15 223 lb 9.6 oz (101.424 kg)  02/11/15 228 lb (103.42 kg)  12/23/13 215 lb 9.6 oz (97.796 kg)     Prior OV from  [name] BarriosLynda H Rowzee is a [age] y.o. female with a pmh of HTN presenting for chief complaint of Edema and Shortness of Breath  Reports 2 week history of bilateral lower leg swelling, weight gain 216-230lbs, abdominal bloating. Also admits shob with increased activity although she has been able to perform ADL's without shob. She has been propping legs up, low sodium diet, taking triamterene-HCT, benazepril. BP has been 130/80 at home. Patient denies chest pain, shob, cough, wheezing, lower leg pain or erythema, n/v, abdominal pain, dizziness,  lightheadedness, headache. Reports that she has not been drinking too much water, 1 cup of tea, coffee. Drinks 3 glasses of wine daily. Never smoker. Has previously had swelling like this episode in her arms following surgery, was resolved using diuretics. Denies any other aggravating or relieving factors, no other questions or concerns.  [name] BrayLynda has a current medication list which includes the following prescription(s): benazepril, cholecalciferol, estrogens (conjugated), ibuprofen, levothyroxine, multivitamin, and triamterene-hydrochlorothiazide. She is allergic to cocoa; red dye; lyrica; and sulfa antibiotics.  A/P  - Likely due to venous insufficiency but will attempt to r/o cardiac process. Unable to complete EKG and CXR today due to patient's time constraints and having to leave clinic. She agreed to have labs drawn. Will follow up on Saturday 02/13/2015. - Start Lasix 40mg , compression stockings for lower leg swelling - Switch to bena-HCT 20-25mg  - Add 20meq K+ due to two diuretics - Counseled patient on worsening signs and symptoms of cardiac process, patient works as [employer] nurse, plans on working tonight so she agreed to seek appropriate medical attention if worsening symptoms develop.    Past Medical History  Diagnosis Date  . Hypertension   . Arthritis   . Degenerative disc disease, cervical   . Hypothyroidism   . Allergy   . Cataract   . 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 Gastric bypass    . Diagnostic laparoscopy    . Open reduction internal fixation (orif) distal radial fracture Right 01/15/2013    Procedure: OPEN REDUCTION INTERNAL FIXATION (ORIF) Right DISTAL RADIUS FRACTURE;  Surgeon: Eldred Manges, MD;  Location: [facility] OR;  Service: Orthopedics;  Laterality: Right;  . Cholecystectomy    . Cosmetic surgery    . Fracture surgery     History   Social History  . Marital  Status: Married    Spouse Name: N/A  . Number of Children: N/A  . Years of Education: N/A   Social History Main Topics  . Smoking status: Never Smoker   . Smokeless tobacco: Not on file  . Alcohol Use: 4.2 oz/week    7 Glasses of wine per week  . Drug Use: No  . Sexual Activity: Yes    Birth Control/ Protection: Post-menopausal   Other Topics Concern  . None   Social History Narrative   Family History  Problem Relation Age of Onset  . Heart disease Mother   . Stroke Mother   . Cancer Mother   . COPD Father   . Cancer Father   . Diabetes Father   . Hypertension Father   . Stroke Maternal Grandmother   . Mental illness Maternal Grandmother   . Cancer Paternal Grandmother    Allergies  Allergen Reactions  . Cocoa Anaphylaxis  . Red Dye Hives    Rash and Nausea diarrhea  . Lyrica [Pregabalin]   . Sulfa Antibiotics Rash   Prior to Admission medications   Medication Sig Start Date End Date Taking? Authorizing Provider  benazepril-hydrochlorthiazide (LOTENSIN HCT) 20-25 MG per tablet Take 1 tablet by mouth daily. 02/11/15  Yes [name], PA-C  Cholecalciferol (VITAMIN D PO) Take by mouth.   Yes Historical Provider, MD  estrogens, conjugated, (PREMARIN) 1.25 MG tablet Take 1.25 mg by mouth daily.   Yes Historical Provider, MD  furosemide (LASIX) 40 MG tablet Take 1 tablet (40 mg total) by mouth daily. 02/11/15  Yes [name], PA-C  ibuprofen (ADVIL,MOTRIN) 600 MG tablet Take 600 mg by mouth every 6 (six) hours as needed.   Yes Historical Provider, MD  levothyroxine (SYNTHROID, LEVOTHROID) 112 MCG tablet Take 112 mcg by mouth daily.   Yes Historical Provider, MD  Multiple Vitamin (MULTIVITAMIN) tablet Take 1 tablet by mouth daily.   Yes Historical Provider, MD  potassium chloride SA (K-DUR,KLOR-CON) 20 MEQ tablet Take 1 tablet (20 mEq total) by mouth daily. 02/11/15  Yes [name], PA-C     ROS: The patient denies fevers, chills, night sweats, unintentional weight loss,  chest pain, palpitations, wheezing, dyspnea on exertion, nausea, vomiting, abdominal pain, dysuria, hematuria, melena, numbness, weakness, or tingling.   All other systems have been reviewed and were otherwise negative with the exception of those mentioned in the HPI and as above.    PHYSICAL EXAM: Filed Vitals:   02/15/15 0850  BP: 144/78  Pulse: 75  Temp: 98 F (36.7 C)  Resp: 18   Filed Vitals:   02/15/15 0850  Height: 5' 5.5" (1.664 m)  Weight: 223 lb 9.6 oz (101.424 kg)   Body mass index is 36.63 kg/(m^2).  General: Alert, no acute distress HEENT:  Normocephalic, atraumatic, oropharynx patent. EOMI, PERRLA Cardiovascular:  Regular rate and rhythm, no rubs murmurs or gallops.  No Carotid bruits, radial pulse intact. + pedal edema.  Respiratory: Clear to auscultation bilaterally.  No  wheezes, rales, or rhonchi.  No cyanosis, no use of accessory musculature GI: No organomegaly, abdomen is soft and non-tender, positive bowel sounds.  No masses. Skin: No rashes. Neurologic: Facial musculature symmetric. Psychiatric: Patient is appropriate throughout our interaction. Lymphatic: No cervical lymphadenopathy Musculoskeletal: Gait intact.   LABS: Results for orders placed or performed in visit on 02/11/15  Comprehensive metabolic panel  Result Value Ref Range   Sodium 131 (L) 135 - 145 mEq/L   Potassium 4.5 3.5 - 5.3 mEq/L   Chloride 95 (L) 96 - 112 mEq/L   CO2 23 19 - 32 mEq/L   Glucose, Bld 97 70 - 99 mg/dL   BUN 10 6 - 23 mg/dL   Creat 8.11 9.14 - 7.82 mg/dL   Total Bilirubin 0.7 0.2 - 1.2 mg/dL   Alkaline Phosphatase 86 39 - 117 U/L   AST 48 (H) 0 - 37 U/L   ALT 37 (H) 0 - 35 U/L   Total Protein 6.8 6.0 - 8.3 g/dL   Albumin 4.0 3.5 - 5.2 g/dL   Calcium 9.2 8.4 - 95.6 mg/dL  Brain natriuretic peptide  Result Value Ref Range   Brain Natriuretic Peptide 96.1 0.0 - 100.0 pg/mL  POCT urinalysis dipstick  Result Value Ref Range   Color, UA dark orange    Clarity, UA  clear    Glucose, UA neg    Bilirubin, UA neg    Ketones, UA neg    Spec Grav, UA 1.020    Blood, UA moderate    pH, UA 6.0    Protein, UA neg    Urobilinogen, UA 1.0    Nitrite, UA neg    Leukocytes, UA Negative      EKG/XRAY:   Primary read interpreted by Dr. Conley Rolls at [facility] No acute cardiopulmonary process There was a left mid lobe nodule that was seen in prior xray , please comment if still present and/or changed in size EKG was sinus bradycardia, without any specific ST changes  ASSESSMENT/PLAN: Encounter Diagnoses  Name Primary?  . Lymphedema Yes  . Hyponatremia   . Essential hypertension   . Hypothyroidism, unspecified hypothyroidism type    Pleasant 69 year old female with a past medical history of hypertension, hypothyroidism, varicose veins is here for a recheck on her bilateral pedal edema, lymphedema. She has been on Lasix 40 grams daily for the last 5 days, and also hydrochlorothiazide 25 mg to help with diuresis and her swelling. She has lost 5 pounds. We will continue her on this regiment and recheck her labs. I will tell her when to stop it. She is to taking a little bit of salt secondary to hyponatremia. Thyroid labs were initially drawn on her last visit but was never collected, we will retry her thyroid tests. Thyroid medication was refilled. We will schedule a venous and also peripheral arterial study to see if this is a vascular issue. She may need to go to [facility] or one of its affiliates to get this done since she is an employee with them. If there are no vascular issues then we will refer her to nephrology/cardiology. She does not have evidence of heart failure at this time based on a normal BNP and also chest x-ray Labs pending: TSH free T3, free T4. Repeat CMP with GFR  Gross sideeffects, risk and benefits, and alternatives of medications d/w patient. Patient is aware that all medications have potential sideeffects and we are unable to predict  every sideeffect or drug-drug interaction  that may occur.  Telecia Larocque PHUONG, DO 02/15/2015 10:21 AM

## 2015-02-15 NOTE — Patient Instructions (Signed)
Hyponatremia  Hyponatremia is when the amount of salt (sodium) in your blood is too low. When sodium levels are low, your cells will absorb extra water and swell. The swelling happens throughout the body, but it mostly affects the brain. Severe brain swelling (cerebral edema), seizures, or coma can happen.  CAUSES   Heart, kidney, or liver problems.  Thyroid problems.  Adrenal gland problems.  Severe vomiting and diarrhea.  Certain medicines or illegal drugs.  Dehydration.  Drinking too much water.  Low-sodium diet. SYMPTOMS   Nausea and vomiting.  Confusion.  Lethargy.  Agitation.  Headache.  Twitching or shaking (seizures).  Unconsciousness.  Appetite loss.  Muscle weakness and cramping. DIAGNOSIS  Hyponatremia is identified by a simple blood test. Your caregiver will perform a history and physical exam to try to find the cause and type of hyponatremia. Other tests may be needed to measure the amount of sodium in your blood and urine. TREATMENT  Treatment will depend on the cause.   Fluids may be given through the vein (IV).  Medicines may be used to correct the sodium imbalance. If medicines are causing the problem, they will need to be adjusted.  Water or fluid intake may be restricted to restore proper balance. The speed of correcting the sodium problem is very important. If the problem is corrected too fast, nerve damage (sometimes unchangeable) can happen. HOME CARE INSTRUCTIONS   Only take medicines as directed by your caregiver. Many medicines can make hyponatremia worse. Discuss all your medicines with your caregiver.  Carefully follow any recommended diet, including any fluid restrictions.  You may be asked to repeat lab tests. Follow these directions.  Avoid alcohol and recreational drugs. SEEK MEDICAL CARE IF:   You develop worsening nausea, fatigue, headache, confusion, or weakness.  Your original hyponatremia symptoms return.  You have  problems following the recommended diet. SEEK IMMEDIATE MEDICAL CARE IF:   You have a seizure.  You faint.  You have ongoing diarrhea or vomiting. MAKE SURE YOU:   Understand these instructions.  Will watch your condition.  Will get help right away if you are not doing well or get worse. Document Released: 11/03/2002 Document Revised: 02/05/2012 Document Reviewed: 04/30/2011 [facility] Patient Information 2015 [employer] [place]. This information is not intended to replace advice given to you by your health care provider. Make sure you discuss any questions you have with your health care provider. Lymphedema Lymphedema is a swelling caused by the abnormal collection of lymph under the skin. The lymph is fluid from the tissues in your body that travels in the lymphatic system. This system is part of the immune system that includes lymph nodes and vessels. The lymph vessels collect and carry the excess fluid, fats, proteins, and wastes from the tissues of the body to the bloodstream. This system also works to clean and remove bacteria and waste products from the body.  Lymphedema occurs when the lymphatic system is blocked. When the lymph vessels or lymph nodes are blocked or damaged, lymph does not drain properly. This causes abnormal build up of lymph. This leads to swelling in the arms or legs. Lymphedema cannot be cured by medicines. But the swelling can be reduced by physical methods. CAUSES  There are two types of lymphedema. Primary lymphedema is caused by the absence or abnormality of the lymph vessel at birth. It is also known as inherited lymphedema, which occurs rarely. Secondary or acquired lymphedema occurs when the lymph vessel is damaged or blocked. The causes  of lymph vessel blockage are:   Skin infection like cellulites.  Infection by parasites (filariasis).  Injury.  Cancer.  Radiation therapy.  Formation of scar tissue.  Surgery. SYMPTOMS  The symptoms of lymphedema  are:  Abnormal swelling of the arm or leg.  Heavy or tight feeling in your arm or leg.  Tight-fitting shoes or rings.  Redness of skin over the affected area.  Limited movement of the affected limb.  Some patients complain about sensitivity to touch and discomfort in the limb(s) affected. You may not have these symptoms immediately following injury. They usually appear within a few days or even years after injury. Inform your caregiver, if you have any of these symptoms. Early treatment can avoid further problems.  DIAGNOSIS  First, your caregiver will inquire about any surgery you have had or medicines you are taking. He will then examine you. Your caregiver may order special imaging tests, such as:  Lymphoscintigraphy (a test in which a low dose of radioactive substance is injected to trace the flow of lymph through the lymph vessels).  MRI (imaging tests using magnetic fields).  Computed tomography (test using special cross-sectional X-rays).  Duplex ultrasound (test using high-frequency sound waves to show the vessels and the blood flow on a screen).  Lymphangiography (special X-ray taken after injecting a contrast dye into the lymph vessel). It is now rarely done. TREATMENT  Lymphedema can be treated in different ways. Your caregiver will decide the type of treatment depending on the cause. Treatment may include:  Exercise: Special exercises will help fluid move out easily from the affected part. This should be done as per your caregiver's advice.  Manual lymph drainage: Gentle massage of the affected limb makes the fluid to move out more freely.  Compression: Compression stockings or external pump apply pressure over the affected limb. This helps the fluid to move out from the arm or leg. Bandaging can also help to move the fluid out from the affected part. Your caregiver will decide the method that suits you the best.  Medicines: Your caregiver may prescribe antibiotics, if  you have infection.  Surgery: Your caregiver may advise surgery for severe lymphedema. It is reserved for special cases when the patient has difficulty moving. Your surgeon may remove excess tissue from the arm or leg. This will help to ease your movement. Physical therapy may have to be continued after surgery. HOME CARE INSTRUCTIONS  The area is very fragile and is predisposed to injury and infection.  Eat a healthy diet.  Exercise regularly as per advice.  Keep the affected area clean and dry.  Use gloves while cooking or gardening.  Protect your skin from cuts.  Use electric razor to shave the affected area.  Keep affected limb elevated.  Do not wear tight clothes, shoes, or jewelry as it may cause the tissue to be strangled.  Do not use heat pads over the affected area.  Do not sit with cross legs.  Do not walk barefoot.  Do not carry weight on the affected arm.  Avoid having blood pressure checked on the affected limb. SEEK MEDICAL CARE IF:  You continue to have swelling in your limb. SEEK IMMEDIATE MEDICAL CARE IF:   You have high fever.  You have skin rash.  You have chills or sweats.  You have pain or redness.  You have a cut that does not heal. MAKE SURE YOU:   Understand these instructions.  Will watch your condition.  Will get help  right away if you are not doing well or get worse. Document Released: 09/10/2007 Document Revised: 10/30/2012 Document Reviewed: 08/16/2009 [facility] Patient Information 2015 [place], [place]. This information is not intended to replace advice given to you by your health care provider. Make sure you discuss any questions you have with your health care provider.

## 2015-02-16 ENCOUNTER — Telehealth: Payer: Self-pay | Admitting: Family Medicine

## 2015-02-16 DIAGNOSIS — E039 Hypothyroidism, unspecified: Secondary | ICD-10-CM

## 2015-02-16 DIAGNOSIS — R7989 Other specified abnormal findings of blood chemistry: Secondary | ICD-10-CM

## 2015-02-16 DIAGNOSIS — R799 Abnormal finding of blood chemistry, unspecified: Secondary | ICD-10-CM

## 2015-02-16 NOTE — Telephone Encounter (Signed)
LM message about labs, please see note in mychart

## 2015-02-17 ENCOUNTER — Telehealth: Payer: Self-pay

## 2015-02-17 DIAGNOSIS — M7989 Other specified soft tissue disorders: Secondary | ICD-10-CM

## 2015-02-17 NOTE — Telephone Encounter (Signed)
Patient returned call from Dr. Conley RollsLe yesterday. Will call back today around 4pm. Cb# [phone].

## 2015-02-22 NOTE — Telephone Encounter (Signed)
Pt called again today.  Pt is requesting more information on when she is suppose to have her doppler completed and go give an update regarding her treatment.  She is requesting that Dr. Conley RollsLe calls her back and to give her more information.

## 2015-02-22 NOTE — Telephone Encounter (Signed)
Spoke with patient. She will return for blood work.I have changed her venous and [facility], rather than in [place]. She is a [employer] employee , so we will need to cancel the one with [facility]. She iwll return for blood work and see is her cratinine is improved, if all good then consider lasix 20 mg daily. Her weight is down to 220 lbs

## 2015-02-24 ENCOUNTER — Other Ambulatory Visit: Payer: Self-pay | Admitting: Family Medicine

## 2015-02-24 ENCOUNTER — Other Ambulatory Visit (INDEPENDENT_AMBULATORY_CARE_PROVIDER_SITE_OTHER): Payer: Commercial Managed Care - PPO

## 2015-02-24 ENCOUNTER — Telehealth: Payer: Self-pay | Admitting: Family Medicine

## 2015-02-24 DIAGNOSIS — E039 Hypothyroidism, unspecified: Secondary | ICD-10-CM

## 2015-02-24 DIAGNOSIS — R7989 Other specified abnormal findings of blood chemistry: Secondary | ICD-10-CM

## 2015-02-24 DIAGNOSIS — R799 Abnormal finding of blood chemistry, unspecified: Secondary | ICD-10-CM | POA: Diagnosis not present

## 2015-02-24 DIAGNOSIS — E871 Hypo-osmolality and hyponatremia: Secondary | ICD-10-CM

## 2015-02-24 LAB — COMPLETE METABOLIC PANEL WITH GFR
ALT: 30 U/L (ref 0–35)
AST: 45 U/L — ABNORMAL HIGH (ref 0–37)
Albumin: 3.7 g/dL (ref 3.5–5.2)
Alkaline Phosphatase: 89 U/L (ref 39–117)
BUN: 13 mg/dL (ref 6–23)
CO2: 29 mEq/L (ref 19–32)
Calcium: 9.1 mg/dL (ref 8.4–10.5)
Chloride: 90 mEq/L — ABNORMAL LOW (ref 96–112)
Creat: 0.64 mg/dL (ref 0.50–1.10)
GFR, Est African American: >89 mL/min
GFR, Est Non African American: >89 mL/min
Glucose, Bld: 104 mg/dL — ABNORMAL HIGH (ref 70–99)
Potassium: 4.1 mEq/L (ref 3.5–5.3)
Sodium: 131 mEq/L — ABNORMAL LOW (ref 135–145)
Total Bilirubin: 0.5 mg/dL (ref 0.2–1.2)
Total Protein: 6.6 g/dL (ref 6.0–8.3)

## 2015-02-24 LAB — TSH: TSH: 3.207 u[IU]/mL (ref 0.350–4.500)

## 2015-02-25 LAB — T3, FREE: T3, Free: 2.3 pg/mL (ref 2.3–4.2)

## 2015-02-25 LAB — T4, FREE: Free T4: 1.12 ng/dL (ref 0.80–1.80)

## 2015-02-27 NOTE — Telephone Encounter (Signed)
mychart message

## 2015-03-02 NOTE — Progress Notes (Signed)
Reviewed documentation and agree w/ assessment and plan. Zayvon Alicea, MD MPH 

## 2015-03-03 ENCOUNTER — Telehealth: Payer: Self-pay

## 2015-03-03 NOTE — Telephone Encounter (Signed)
Dr Conley RollsLe; Please specify on doppler.

## 2015-03-03 NOTE — Telephone Encounter (Signed)
She is a patient of Dr. Conley RollsLe. She knows she has to get a Doppler study done. She would like this done where she works-[employer]. Please advise and  leave a message on her machine [phone]

## 2015-03-05 NOTE — Telephone Encounter (Signed)
She needs a venous Doppler study's looking for venous insufficiency and then also an arterial Doppler to test for peripheral arterial disease on both legs.

## 2015-03-08 ENCOUNTER — Other Ambulatory Visit: Payer: Self-pay | Admitting: Radiology

## 2015-03-08 DIAGNOSIS — I779 Disorder of arteries and arterioles, unspecified: Secondary | ICD-10-CM

## 2015-03-08 DIAGNOSIS — I872 Venous insufficiency (chronic) (peripheral): Secondary | ICD-10-CM

## 2015-03-08 NOTE — Telephone Encounter (Signed)
Will refer. 

## 2015-04-12 ENCOUNTER — Other Ambulatory Visit: Payer: Self-pay | Admitting: Family Medicine

## 2015-04-12 DIAGNOSIS — I739 Peripheral vascular disease, unspecified: Secondary | ICD-10-CM

## 2015-04-14 ENCOUNTER — Ambulatory Visit (HOSPITAL_COMMUNITY): Payer: Commercial Managed Care - PPO | Attending: Family Medicine

## 2015-04-14 ENCOUNTER — Other Ambulatory Visit: Payer: Self-pay | Admitting: Family Medicine

## 2015-04-14 DIAGNOSIS — R0602 Shortness of breath: Secondary | ICD-10-CM | POA: Diagnosis not present

## 2015-04-14 DIAGNOSIS — M25579 Pain in unspecified ankle and joints of unspecified foot: Secondary | ICD-10-CM

## 2015-04-14 DIAGNOSIS — M79604 Pain in right leg: Secondary | ICD-10-CM | POA: Diagnosis present

## 2015-04-14 DIAGNOSIS — M25473 Effusion, unspecified ankle: Secondary | ICD-10-CM

## 2015-04-14 DIAGNOSIS — M79605 Pain in left leg: Secondary | ICD-10-CM | POA: Insufficient documentation

## 2015-04-14 DIAGNOSIS — M25461 Effusion, right knee: Secondary | ICD-10-CM | POA: Insufficient documentation

## 2015-04-14 DIAGNOSIS — I10 Essential (primary) hypertension: Secondary | ICD-10-CM | POA: Insufficient documentation

## 2015-04-19 ENCOUNTER — Other Ambulatory Visit: Payer: Self-pay | Admitting: Family Medicine

## 2015-04-19 DIAGNOSIS — I739 Peripheral vascular disease, unspecified: Secondary | ICD-10-CM

## 2015-04-21 ENCOUNTER — Encounter (HOSPITAL_COMMUNITY): Payer: Commercial Managed Care - PPO

## 2015-04-23 ENCOUNTER — Encounter (HOSPITAL_COMMUNITY): Payer: Commercial Managed Care - PPO

## 2015-04-25 ENCOUNTER — Ambulatory Visit (INDEPENDENT_AMBULATORY_CARE_PROVIDER_SITE_OTHER): Payer: Commercial Managed Care - PPO | Admitting: Family Medicine

## 2015-04-25 ENCOUNTER — Encounter (HOSPITAL_COMMUNITY)
Admission: EM | Disposition: A | Discharge status: Home / Self Care | Payer: Self-pay | Source: Home / Self Care | Attending: Internal Medicine

## 2015-04-25 ENCOUNTER — Inpatient Hospital Stay (HOSPITAL_COMMUNITY)
Admission: EM | Admit: 2015-04-25 | Discharge: 2015-04-27 | DRG: 378 | Disposition: A | Discharge status: Home / Self Care | Payer: Commercial Managed Care - PPO | Attending: Internal Medicine | Admitting: Internal Medicine

## 2015-04-25 ENCOUNTER — Encounter (HOSPITAL_COMMUNITY): Payer: Self-pay | Admitting: *Deleted

## 2015-04-25 VITALS — BP 132/72 | HR 81 | Temp 98.3°F | Resp 18 | Ht 65.5 in | Wt 228.0 lb

## 2015-04-25 DIAGNOSIS — I10 Essential (primary) hypertension: Secondary | ICD-10-CM | POA: Diagnosis present

## 2015-04-25 DIAGNOSIS — K921 Melena: Secondary | ICD-10-CM

## 2015-04-25 DIAGNOSIS — F1010 Alcohol abuse, uncomplicated: Secondary | ICD-10-CM | POA: Diagnosis present

## 2015-04-25 DIAGNOSIS — D62 Acute posthemorrhagic anemia: Secondary | ICD-10-CM

## 2015-04-25 DIAGNOSIS — K922 Gastrointestinal hemorrhage, unspecified: Secondary | ICD-10-CM

## 2015-04-25 DIAGNOSIS — Z9884 Bariatric surgery status: Secondary | ICD-10-CM | POA: Diagnosis not present

## 2015-04-25 DIAGNOSIS — R110 Nausea: Secondary | ICD-10-CM | POA: Diagnosis not present

## 2015-04-25 DIAGNOSIS — E871 Hypo-osmolality and hyponatremia: Secondary | ICD-10-CM | POA: Diagnosis present

## 2015-04-25 DIAGNOSIS — E039 Hypothyroidism, unspecified: Secondary | ICD-10-CM | POA: Diagnosis present

## 2015-04-25 DIAGNOSIS — Z79899 Other long term (current) drug therapy: Secondary | ICD-10-CM | POA: Diagnosis not present

## 2015-04-25 DIAGNOSIS — K280 Acute gastrojejunal ulcer with hemorrhage: Principal | ICD-10-CM | POA: Diagnosis present

## 2015-04-25 DIAGNOSIS — K920 Hematemesis: Secondary | ICD-10-CM | POA: Diagnosis not present

## 2015-04-25 DIAGNOSIS — F419 Anxiety disorder, unspecified: Secondary | ICD-10-CM | POA: Diagnosis present

## 2015-04-25 DIAGNOSIS — K2971 Gastritis, unspecified, with bleeding: Secondary | ICD-10-CM

## 2015-04-25 DIAGNOSIS — K283 Acute gastrojejunal ulcer without hemorrhage or perforation: Secondary | ICD-10-CM | POA: Insufficient documentation

## 2015-04-25 DIAGNOSIS — K264 Chronic or unspecified duodenal ulcer with hemorrhage: Secondary | ICD-10-CM | POA: Diagnosis not present

## 2015-04-25 DIAGNOSIS — Z791 Long term (current) use of non-steroidal anti-inflammatories (NSAID): Secondary | ICD-10-CM

## 2015-04-25 HISTORY — DX: Alcohol abuse, uncomplicated: F10.10

## 2015-04-25 HISTORY — DX: Gastrointestinal hemorrhage, unspecified: K92.2

## 2015-04-25 HISTORY — DX: Essential (primary) hypertension: I10

## 2015-04-25 HISTORY — PX: ESOPHAGOGASTRODUODENOSCOPY: SHX5428

## 2015-04-25 HISTORY — PX: OTHER SURGICAL HISTORY: SHX169

## 2015-04-25 LAB — POCT URINALYSIS DIPSTICK
Glucose, UA: NEGATIVE
Leukocytes, UA: NEGATIVE
Nitrite, UA: NEGATIVE
Protein, UA: NEGATIVE
Spec Grav, UA: 1.01
Urobilinogen, UA: 0.2
pH, UA: 5.5

## 2015-04-25 LAB — POCT UA - MICROSCOPIC ONLY
Casts, Ur, LPF, POC: NEGATIVE
Crystals, Ur, HPF, POC: NEGATIVE
Mucus, UA: NEGATIVE
Yeast, UA: NEGATIVE

## 2015-04-25 LAB — POCT CBC
Granulocyte percent: 57.5 %G (ref 37–80)
HCT, POC: 32.4 % — AB (ref 37.7–47.9)
Hemoglobin: 10.6 g/dL — AB (ref 12.2–16.2)
Lymph, poc: 2.4 (ref 0.6–3.4)
MCH, POC: 35.4 pg — AB (ref 27–31.2)
MCHC: 32.9 g/dL (ref 31.8–35.4)
MCV: 107.5 fL — AB (ref 80–97)
MID (cbc): 0.6 (ref 0–0.9)
MPV: 6.4 fL (ref 0–99.8)
POC Granulocyte: 4.1 (ref 2–6.9)
POC LYMPH PERCENT: 34.1 %L (ref 10–50)
POC MID %: 8.4 %M (ref 0–12)
Platelet Count, POC: 251 10*3/uL (ref 142–424)
RBC: 3.01 M/uL — AB (ref 4.04–5.48)
RDW, POC: 13.8 %
WBC: 7.1 10*3/uL (ref 4.6–10.2)

## 2015-04-25 LAB — COMPREHENSIVE METABOLIC PANEL
ALT: 24 U/L (ref 0–35)
ALT: 24 U/L (ref 14–54)
AST: 26 U/L (ref 0–37)
AST: 27 U/L (ref 15–41)
Albumin: 3.6 g/dL (ref 3.5–5.2)
Albumin: 3.2 g/dL — ABNORMAL LOW (ref 3.5–5.0)
Alkaline Phosphatase: 74 U/L (ref 39–117)
Alkaline Phosphatase: 73 U/L (ref 38–126)
Anion gap: 10 (ref 5–15)
BUN: 22 mg/dL (ref 6–23)
BUN: 22 mg/dL — ABNORMAL HIGH (ref 6–20)
CO2: 30 mEq/L (ref 19–32)
CO2: 30 mmol/L (ref 22–32)
Calcium: 8.8 mg/dL (ref 8.4–10.5)
Calcium: 8.5 mg/dL — ABNORMAL LOW (ref 8.9–10.3)
Chloride: 91 mEq/L — ABNORMAL LOW (ref 96–112)
Chloride: 89 mmol/L — ABNORMAL LOW (ref 101–111)
Creat: 1.04 mg/dL (ref 0.50–1.10)
Creatinine, Ser: 1.19 mg/dL — ABNORMAL HIGH (ref 0.44–1.00)
GFR calc Af Amer: 53 mL/min — ABNORMAL LOW (ref >60)
GFR calc non Af Amer: 46 mL/min — ABNORMAL LOW (ref >60)
Glucose, Bld: 112 mg/dL — ABNORMAL HIGH (ref 70–99)
Glucose, Bld: 104 mg/dL — ABNORMAL HIGH (ref 65–99)
Potassium: 3.9 mEq/L (ref 3.5–5.3)
Potassium: 3.7 mmol/L (ref 3.5–5.1)
Sodium: 130 mEq/L — ABNORMAL LOW (ref 135–145)
Sodium: 129 mmol/L — ABNORMAL LOW (ref 135–145)
Total Bilirubin: 0.8 mg/dL (ref 0.2–1.2)
Total Bilirubin: 0.8 mg/dL (ref 0.3–1.2)
Total Protein: 5.9 g/dL — ABNORMAL LOW (ref 6.0–8.3)
Total Protein: 5.6 g/dL — ABNORMAL LOW (ref 6.5–8.1)

## 2015-04-25 LAB — HEMOGLOBIN AND HEMATOCRIT, BLOOD
HCT: 26.5 % — ABNORMAL LOW (ref 36.0–46.0)
HCT: 25.9 % — ABNORMAL LOW (ref 36.0–46.0)
Hemoglobin: 9 g/dL — ABNORMAL LOW (ref 12.0–15.0)
Hemoglobin: 8.8 g/dL — ABNORMAL LOW (ref 12.0–15.0)

## 2015-04-25 LAB — CBC
HCT: 28.7 % — ABNORMAL LOW (ref 36.0–46.0)
Hemoglobin: 9.8 g/dL — ABNORMAL LOW (ref 12.0–15.0)
MCH: 35.4 pg — ABNORMAL HIGH (ref 26.0–34.0)
MCHC: 34.1 g/dL (ref 30.0–36.0)
MCV: 103.6 fL — ABNORMAL HIGH (ref 78.0–100.0)
Platelets: 208 10*3/uL (ref 150–400)
RBC: 2.77 MIL/uL — ABNORMAL LOW (ref 3.87–5.11)
RDW: 12.6 % (ref 11.5–15.5)
WBC: 5.7 10*3/uL (ref 4.0–10.5)

## 2015-04-25 LAB — TYPE AND SCREEN
ABO/RH(D): O POS
Antibody Screen: NEGATIVE

## 2015-04-25 LAB — IFOBT (OCCULT BLOOD): IFOBT: POSITIVE

## 2015-04-25 LAB — PROTIME-INR
INR: 1.08 (ref 0.00–1.49)
Prothrombin Time: 14.2 seconds (ref 11.6–15.2)

## 2015-04-25 LAB — ABO/RH: ABO/RH(D): O POS

## 2015-04-25 SURGERY — EGD (ESOPHAGOGASTRODUODENOSCOPY)
Anesthesia: Moderate Sedation

## 2015-04-25 MED ORDER — ONE-DAILY MULTI VITAMINS PO TABS
1.0000 | ORAL_TABLET | Freq: Every day | ORAL | Status: DC
Start: 2015-04-25 — End: 2015-04-27
  Administered 2015-04-25 – 2015-04-27 (×3): 1 via ORAL
  Filled 2015-04-25 (×3): qty 1

## 2015-04-25 MED ORDER — ACETAMINOPHEN 650 MG RE SUPP: 650.0000 mg | Freq: Four times a day (QID) | RECTAL | Status: DC | PRN

## 2015-04-25 MED ORDER — THIAMINE HCL 100 MG/ML IJ SOLN
100.0000 mg | Freq: Every day | INTRAMUSCULAR | Status: DC
  Filled 2015-04-25 (×3): qty 1

## 2015-04-25 MED ORDER — LEVOTHYROXINE SODIUM 112 MCG PO TABS
112.0000 ug | ORAL_TABLET | Freq: Every day | ORAL | Status: DC
  Administered 2015-04-26 – 2015-04-27 (×2): 112 ug via ORAL
  Filled 2015-04-25 (×3): qty 1

## 2015-04-25 MED ORDER — MIDAZOLAM HCL 10 MG/2ML IJ SOLN
INTRAMUSCULAR | Status: DC | PRN
Start: 2015-04-25 — End: 2015-04-25
  Administered 2015-04-25: 1 mg via INTRAVENOUS
  Administered 2015-04-25 (×3): 2 mg via INTRAVENOUS

## 2015-04-25 MED ORDER — FOLIC ACID 1 MG PO TABS
1.0000 mg | ORAL_TABLET | Freq: Every day | ORAL | Status: DC
  Administered 2015-04-25 – 2015-04-27 (×3): 1 mg via ORAL
  Filled 2015-04-25 (×3): qty 1

## 2015-04-25 MED ORDER — ACETAMINOPHEN 325 MG PO TABS: 650.0000 mg | ORAL_TABLET | Freq: Four times a day (QID) | ORAL | Status: DC | PRN

## 2015-04-25 MED ORDER — ONDANSETRON HCL 4 MG PO TABS: 4.0000 mg | ORAL_TABLET | Freq: Four times a day (QID) | ORAL | Status: DC | PRN

## 2015-04-25 MED ORDER — VITAMIN B-1 100 MG PO TABS
100.0000 mg | ORAL_TABLET | Freq: Every day | ORAL | Status: DC
  Administered 2015-04-25 – 2015-04-27 (×3): 100 mg via ORAL
  Filled 2015-04-25 (×3): qty 1

## 2015-04-25 MED ORDER — DIPHENHYDRAMINE HCL 50 MG/ML IJ SOLN
INTRAMUSCULAR | Status: DC | PRN
  Administered 2015-04-25: 25 mg via INTRAVENOUS

## 2015-04-25 MED ORDER — ONDANSETRON HCL 4 MG/2ML IJ SOLN: 4.0000 mg | Freq: Four times a day (QID) | INTRAMUSCULAR | Status: DC | PRN

## 2015-04-25 MED ORDER — SODIUM CHLORIDE 0.9 % IV SOLN: INTRAVENOUS | Status: DC

## 2015-04-25 MED ORDER — SODIUM CHLORIDE 0.9 % IJ SOLN
PREFILLED_SYRINGE | INTRAMUSCULAR | Status: DC | PRN
  Administered 2015-04-25: 2 mL

## 2015-04-25 MED ORDER — FENTANYL CITRATE (PF) 100 MCG/2ML IJ SOLN
INTRAMUSCULAR | Status: DC | PRN
  Administered 2015-04-25 (×3): 25 ug via INTRAVENOUS

## 2015-04-25 MED ORDER — MIDAZOLAM HCL 5 MG/ML IJ SOLN
INTRAMUSCULAR | Status: AC
  Filled 2015-04-25: qty 3

## 2015-04-25 MED ORDER — PANTOPRAZOLE SODIUM 40 MG IV SOLR
8.0000 mg/h | INTRAVENOUS | Status: DC
  Administered 2015-04-25 – 2015-04-27 (×5): 8 mg/h via INTRAVENOUS
  Filled 2015-04-25 (×10): qty 80

## 2015-04-25 MED ORDER — SODIUM CHLORIDE 0.9 % IV SOLN
INTRAVENOUS | Status: DC
  Administered 2015-04-25 – 2015-04-27 (×3): via INTRAVENOUS

## 2015-04-25 MED ORDER — FENTANYL CITRATE (PF) 100 MCG/2ML IJ SOLN
INTRAMUSCULAR | Status: AC
  Filled 2015-04-25: qty 4

## 2015-04-25 MED ORDER — EPINEPHRINE HCL 0.1 MG/ML IJ SOSY
PREFILLED_SYRINGE | INTRAMUSCULAR | Status: AC
  Filled 2015-04-25: qty 10

## 2015-04-25 MED ORDER — LORAZEPAM 1 MG PO TABS
1.0000 mg | ORAL_TABLET | Freq: Four times a day (QID) | ORAL | Status: DC | PRN
  Administered 2015-04-26: 1 mg via ORAL
  Filled 2015-04-25: qty 1

## 2015-04-25 MED ORDER — MELATONIN 5 MG PO CAPS
5.0000 mg | ORAL_CAPSULE | Freq: Every day | ORAL | Status: DC
  Filled 2015-04-25: qty 1

## 2015-04-25 MED ORDER — LORAZEPAM 2 MG/ML IJ SOLN: 1.0000 mg | Freq: Four times a day (QID) | INTRAMUSCULAR | Status: DC | PRN

## 2015-04-25 MED ORDER — SODIUM CHLORIDE 0.9 % IV SOLN
80.0000 mg | Freq: Once | INTRAVENOUS | Status: AC
  Administered 2015-04-25: 80 mg via INTRAVENOUS
  Filled 2015-04-25: qty 80

## 2015-04-25 MED ORDER — DIPHENHYDRAMINE HCL 50 MG/ML IJ SOLN
INTRAMUSCULAR | Status: AC
  Filled 2015-04-25: qty 1

## 2015-04-25 MED ORDER — PANTOPRAZOLE SODIUM 40 MG IV SOLR: 40.0000 mg | Freq: Two times a day (BID) | INTRAVENOUS | Status: DC

## 2015-04-25 MED ORDER — POTASSIUM CHLORIDE CRYS ER 20 MEQ PO TBCR
20.0000 meq | EXTENDED_RELEASE_TABLET | Freq: Every day | ORAL | Status: DC
  Administered 2015-04-25 – 2015-04-27 (×3): 20 meq via ORAL
  Filled 2015-04-25 (×3): qty 1

## 2015-04-25 NOTE — H&P (Signed)
Patient Demographics  [name], is a 69 y.o. female  MRN: [id number]   DOB - 1946/07/26  Admit Date - 04/25/2015  Outpatient Primary MD for the patient is No PCP Per Patient   With History of -  Past Medical History  Diagnosis Date  . Hypertension   . Arthritis   . Degenerative disc disease, cervical   . Hypothyroidism   . Allergy   . Cataract   . 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 Substance abuse     Alcohol abuse 2016      Past Surgical History  Procedure Laterality Date  . Gastric bypass    . Diagnostic laparoscopy    . Open reduction internal fixation (orif) distal radial fracture Right 01/15/2013    Procedure: OPEN REDUCTION INTERNAL FIXATION (ORIF) Right DISTAL RADIUS FRACTURE;  Surgeon: Eldred Manges, MD;  Location: [facility] OR;  Service: Orthopedics;  Laterality: Right;  . Cholecystectomy    . Cosmetic surgery    . Fracture surgery      in for   Chief Complaint  Patient presents with  . GI Bleeding     HPI  [name]  is a 69 y.o. female, with past medical history of alcohol abuse, hypertension, hypothyroidism, morbid obesity in the past status post gastric bypass surgery at [facility] at 2003, patient presents with complaints of coffee-ground emesis and melanoma, patient endorses using Depo-Provera and 600 mg oral 3 times daily, heavy alcohol consumption one bottle of wine on a daily basis, patient reports yesterday p.m. she had 2 episodes of coffee-ground emesis, and yesterday 8 PM she started to have melanoma, 8 episodes overnight, patient reports she has ever drinking habits giving high stress at works as she works as an Insurance underwriter at [employer], she denies any history of gastric ulcers in the past, or GI bleed, patient hemoglobin was 9.8, most recent value we have before 2 years at 13.3, she reports she has been using ranitidine and comes for heartburn as needed, patient was seen by  gastroenterology, who recommended urgent endoscopy for further evaluation,    Review of Systems    In addition to the HPI above,  No Fever-chills, No Headache, No changes with Vision or hearing, No problems swallowing food or Liquids, No Chest pain, Cough or Shortness of Breath, No Abdominal pain, Patient reports coffee-ground emesis, melanoma No dysuria, No new skin rashes or bruises, No new joints pains-aches,  No new weakness, tingling, numbness in any extremity, No recent weight gain or loss, No polyuria, polydypsia or polyphagia, Reports significant Mental Stressors. Mainly work, endorses heavy alcohol use  A full 10 point Review of Systems was done, except as stated above, all other Review of Systems were negative.   Social History History  Substance Use Topics  . Smoking status: Never Smoker   . Smokeless tobacco: Not on file  . Alcohol Use: 16.8 oz/week    28 Glasses of wine per week     Comment: drinks daily and has a bottle of wine per day     Family History Family History  Problem Relation Age of Onset  . Heart disease Mother   . Stroke Mother   . Cancer Mother   . COPD Father   . Cancer Father   . Diabetes Father   . Hypertension Father   . Stroke Maternal Grandmother   . Mental illness Maternal Grandmother   .  Cancer Paternal Grandmother      Prior to Admission medications   Medication Sig Start Date End Date Taking? Authorizing Provider  Cholecalciferol (VITAMIN D PO) Take 1 tablet by mouth daily as needed (supplement).    Yes Historical Provider, MD  dextrose 5 % SOLN 50 mL with magnesium sulfate 50 % SOLN 1 g Inject 1 g into the vein daily as needed (pain).   Yes Historical Provider, MD  diphenhydrAMINE (SOMINEX) 25 MG tablet Take 25 mg by mouth at bedtime as needed for sleep.   Yes Historical Provider, MD  estrogens, conjugated, (PREMARIN) 1.25 MG tablet Take 1.25 mg by mouth daily.   Yes Historical Provider, MD  furosemide (LASIX) 40 MG tablet  Take 1 tablet (40 mg total) by mouth daily. Patient taking differently: Take 40 mg by mouth every other day.  02/11/15  Yes Wallis BambergMario Mani, PA-C  ibuprofen (ADVIL,MOTRIN) 600 MG tablet Take 600 mg by mouth every 6 (six) hours as needed for moderate pain.    Yes Historical Provider, MD  levothyroxine (SYNTHROID, LEVOTHROID) 112 MCG tablet Take 1 tablet (112 mcg total) by mouth daily. 02/15/15  Yes Thao P Le, DO  Melatonin 5 MG CAPS Take 5 mg by mouth at bedtime.   Yes Historical Provider, MD  Multiple Vitamin (MULTIVITAMIN) tablet Take 1 tablet by mouth daily.   Yes Historical Provider, MD  ondansetron (ZOFRAN) 4 MG tablet Take 4 mg by mouth every 8 (eight) hours as needed for nausea or vomiting.   Yes Historical Provider, MD  pantoprazole (PROTONIX) 40 MG tablet Take 40 mg by mouth daily.   Yes Historical Provider, MD  potassium chloride SA (K-DUR,KLOR-CON) 20 MEQ tablet Take 1 tablet (20 mEq total) by mouth daily. 02/11/15  Yes Wallis BambergMario Mani, PA-C  ranitidine (ZANTAC) 150 MG tablet Take 150 mg by mouth 2 (two) times daily.   Yes Historical Provider, MD    Allergies  Allergen Reactions  . Cocoa Anaphylaxis  . Red Dye Hives    Rash and Nausea diarrhea  . Lyrica [Pregabalin] Other (See Comments)    Abnormal bleeding  . Sulfa Antibiotics Rash    Physical Exam  Vitals  Blood pressure 108/52, pulse 73, temperature 97.9 F (36.6 C), temperature source Oral, resp. rate 13, SpO2 99 %.   1. General well-nourished female lying in bed in NAD,    2. Normal affect and insight, Not Suicidal or Homicidal, Awake Alert, Oriented X 3.  3. No F.N deficits, ALL C.Nerves Intact, Strength 5/5 all 4 extremities, Sensation intact all 4 extremities, Plantars down going.  4. Ears and Eyes appear Normal, Conjunctivae clear, PERRLA. Moist Oral Mucosa.  5. Supple Neck, No JVD, No cervical lymphadenopathy appriciated, No Carotid Bruits.  6. Symmetrical Chest wall movement, Good air movement bilaterally, CTAB.  7.  RRR, No Gallops, Rubs or Murmurs, No Parasternal Heave.  8. Positive Bowel Sounds, Abdomen Soft, No tenderness, No organomegaly appriciated,No rebound -guarding or rigidity.  9.  No Cyanosis, Normal Skin Turgor, No Skin Rash or Bruise.  10. Good muscle tone,  joints appear normal , no effusions, Normal ROM. +1 edema bilaterally  11. No Palpable Lymph Nodes in Neck or Axillae    Data Review  CBC  Recent Labs Lab 04/25/15 0937 04/25/15 1258  WBC 7.1 5.7  HGB 10.6* 9.8*  HCT 32.4* 28.7*  PLT  --  208  MCV 107.5* 103.6*  MCH 35.4* 35.4*  MCHC 32.9 34.1  RDW  --  12.6   ------------------------------------------------------------------------------------------------------------------  Chemistries   Recent Labs Lab 04/25/15 0937 04/25/15 1258  NA 130* 129*  K 3.9 3.7  CL 91* 89*  CO2 30 30  GLUCOSE 112* 104*  BUN 22 22*  CREATININE 1.04 1.19*  CALCIUM 8.8 8.5*  AST 26 27  ALT 24 24  ALKPHOS 74 73  BILITOT 0.8 0.8   ------------------------------------------------------------------------------------------------------------------ estimated creatinine clearance is 54.5 mL/min (by C-G formula based on Cr of 1.19). ------------------------------------------------------------------------------------------------------------------ No results for input(s): TSH, T4TOTAL, T3FREE, THYROIDAB in the last 72 hours.  Invalid input(s): FREET3   Coagulation profile  Recent Labs Lab 04/25/15 1258  [name] 1.08   ------------------------------------------------------------------------------------------------------------------- No results for input(s): DDIMER in the last 72 hours. -------------------------------------------------------------------------------------------------------------------  Cardiac Enzymes No results for input(s): [name], TROPONINI, MYOGLOBIN in the last 168 hours.  Invalid input(s):  CK ------------------------------------------------------------------------------------------------------------------ Invalid input(s): POCBNP   ---------------------------------------------------------------------------------------------------------------  Urinalysis    Component Value Date/Time   COLORURINE YELLOW 01/15/2013 1512   APPEARANCEUR CLOUDY* 01/15/2013 1512   LABSPEC 1.023 01/15/2013 1512   PHURINE 5.0 01/15/2013 1512   GLUCOSEU NEGATIVE 01/15/2013 1512   HGBUR MODERATE* 01/15/2013 1512   BILIRUBINUR small 04/25/2015 0937   BILIRUBINUR NEGATIVE 01/15/2013 1512   KETONESUR NEGATIVE 01/15/2013 1512   PROTEINUR neg 04/25/2015 0937   PROTEINUR NEGATIVE 01/15/2013 1512   UROBILINOGEN 0.2 04/25/2015 0937   UROBILINOGEN 0.2 01/15/2013 1512   NITRITE neg 04/25/2015 0937   NITRITE NEGATIVE 01/15/2013 1512   LEUKOCYTESUR Negative 04/25/2015 0937    ----------------------------------------------------------------------------------------------------------------  Imaging results:   No results found.      Assessment & Plan  Active Problems:   GI bleed   HTN (hypertension)   Alcohol abuse   Anemia associated with acute blood loss   Hyponatremia    Anemia of acute blood loss secondary to GI bleed - Presents with hematemesis and melanotic, differentials include varices with known history of alcohol abuse, severe gastritis or anastomosis ulcer with known history of IV NSAIDs and alcohol use. - Keep nothing by mouth, keep active type and screen, started on Protonix drip, GI consult appreciated, plan is for emergent EGD today, monitor H&H every 6 hours.  Hypertension - Hold home medication as blood pressure is on the lower side  Hypothyroidism - Continue with Synthroid  Hyponatremia - Chronic at baseline, continue with IV normal saline  Alcohol abuse - Started on CIWA protocol  DVT Prophylaxis SCDs, no chemical anticoagulation given GI bleed  AM Labs  Ordered, also please review Full Orders  Family Communication: Admission, patients condition and plan of care including tests being ordered have been discussed with the patient who indicate understanding and agree with the plan and Code Status.  Code Status full  Likely DC to  pending further workup  Condition GUARDED    Time spent in minutes : 55 minutes    Biannca Scantlin M.D on 04/25/2015 at 3:11 PM  Between 7am to 7pm - Pager - [phone]  After 7pm go to [email or web address] - password [email or web address]  And look for the night coverage person covering me after hours  [facility] Office  [phone]

## 2015-04-25 NOTE — ED Notes (Signed)
Pt started vomiting blood yesterday afternoon and passing maroon stools x8 last nite and then 3 this am.  PT denies blood thinners.  Pt does take Ibuprofen 600mg  three times a day for back pain. PT states stomach is uncomfortable, but not hurting.  Pt is an ICU nurse.

## 2015-04-25 NOTE — Op Note (Signed)
[facility] [address] [place], [id number]   ENDOSCOPY PROCEDURE REPORT  PATIENT: [name], [name]  MR#: [id number] BIRTHDATE: [birth year] , 68  yrs. old GENDER: female ENDOSCOPIST: Beverley FiedlerJay M Kerrington Sova, MD REFERRED BY:  Dr. Silvana NewnessJames, [place] emergency department PROCEDURE DATE:  04/25/2015 PROCEDURE:  EGD w/ control of bleeding and EGD w/ directed submucosal injection(s), any substance ASA CLASS:     Class III INDICATIONS:  hematemesis, hematochezia, and acute post hemorrhagic anemia. MEDICATIONS: Benadryl 25 mg IV, Fentanyl 75 mcg IV, Versed 7 mg IV, and Epinephrine 2 cc TOPICAL ANESTHETIC: Cetacaine Spray  DESCRIPTION OF PROCEDURE: After the risks benefits and alternatives of the procedure were thoroughly explained, informed consent was obtained.  The Pentax adult upper endoscope was introduced through the mouth and advanced to the second portion of the duodenum , Without limitations.  The instrument was slowly withdrawn as the mucosa was fully examined.   ESOPHAGUS: The mucosa of the esophagus appeared normal.   No esophageal varices.  STOMACH: A Roux -en-Y anastomosis was found.  6 cm gastric pouch, gastric mucosal normal in appearance.  Anastomotic ulceration on the jejunal side of the anastomosis. The ulcer was deep had hematin at its base and on the distal edge a visible vessel was seen. Epinephrine injection was performed around the visible vessel with good effect. 1 hemostatic clip was applied just above the visible vessel. Attempt to place a second hemostatic clip was unsuccessful. Therefore the gold probe was used to cauterize the visible vessel using firm pressure. There was good treatment effect and no further bleeding was seen.  Staples were visible near the anastomosis.  JEJUNUM: The examined efferent jejunal limb revealed normal mucosa. Retroflexion was not performed.  The scope was then withdrawn from the patient and the procedure  completed.  COMPLICATIONS: There were no immediate complications.    ENDOSCOPIC IMPRESSION: 1.   The mucosa of the esophagus appeared normal, no esophageal varices 2.   Roux -en-Y anastomosis was found with deep anastomotic ulcer with visible vessel; treated as above 3.   The exam showed no abnormalities in the examined efferent jejunal limb  RECOMMENDATIONS: 1.  Continue PPI infusion for 72 hours 2.  Closely monitor hemoglobin 3.  No NSAIDs   eSigned:  Beverley FiedlerJay M Shekinah Pitones, MD 04/25/2015 6:55 PM    PATIENT NAME:  [name], [name] MR#: [id number]

## 2015-04-25 NOTE — Progress Notes (Signed)

## 2015-04-25 NOTE — Consult Note (Signed)
Consultation for Dr. Elnoria Howard [facility])  Referring Provider: Dr. Fayrene Fearing [facility]) Primary Care Physician:  none Primary Gastroenterologist: Dr. Elnoria Howard  Reason for Consultation:  Hematemesis and hematochezia  HPI: [name] is a 69 y.o. female with past medical history of morbid obesity status post gastric bypass in approximately 2003 at [facility], alcohol abuse, colon polyps, hypertension and hypothyroidism who presents to the ER after having 2 episodes of bright red bloody emesis followed by 6-7 episodes of maroon stool. This started yesterday. She works as an Insurance underwriter at [employer] and issue started around 2 PM yesterday after she worked the shift Friday night.  She denies abdominal pain currently. She reports she's been under a great deal of stress over the last year. She's been dealing with this by increasing her alcohol intake. She admits to drinking one bottle of wine daily over the past year perhaps more on the weekends. Prior to this she drank 1-2 drinks daily for many years. No history of alcohol withdrawal although she is concerned that this could happen after 2 or 3 days without alcohol. She denies a family history of liver disease, personal history of liver disease. No family history of alcoholism, GI tract malignancy. She does use ibuprofen 3 times daily for many years. She uses occasional ranitidine for heartburn and tries to use TUMS for calcium but she often forgets. She reports after working she has upper abdominal cramping or gnawing type discomfort which she felt was stress because it always goes away when she gets home.  She does report some mild lower extremity edema but this is been an issue for her over time. Denies ascites, jaundice, confusion. No epistaxis or gingival bleeding. Overall weight has been stable.  She recalls having a colonoscopy performed by Dr. Elnoria Howard approximately 3 years ago. She was told have a follow-up colonoscopy in 3 years. She remembers  having a benign but large polyp removed.   Past Medical History  Diagnosis Date  . Hypertension   . Arthritis   . Degenerative disc disease, cervical   . Hypothyroidism   . Allergy   . Cataract   . 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 Substance abuse     Alcohol abuse 2016    Past Surgical History  Procedure Laterality Date  . Gastric bypass    . Diagnostic laparoscopy    . Open reduction internal fixation (orif) distal radial fracture Right 01/15/2013    Procedure: OPEN REDUCTION INTERNAL FIXATION (ORIF) Right DISTAL RADIUS FRACTURE;  Surgeon: Eldred Manges, MD;  Location: [facility] OR;  Service: Orthopedics;  Laterality: Right;  . Cholecystectomy    . Cosmetic surgery    . Fracture surgery      Prior to Admission medications   Medication Sig Start Date End Date Taking? Authorizing Provider  Cholecalciferol (VITAMIN D PO) Take 1 tablet by mouth daily as needed (supplement).    Yes Historical Provider, MD  dextrose 5 % SOLN 50 mL with magnesium sulfate 50 % SOLN 1 g Inject 1 g into the vein daily as needed (pain).   Yes Historical Provider, MD  diphenhydrAMINE (SOMINEX) 25 MG tablet Take 25 mg by mouth at bedtime as needed for sleep.   Yes Historical Provider, MD  estrogens, conjugated, (PREMARIN) 1.25 MG tablet Take 1.25 mg by mouth daily.   Yes Historical Provider, MD  furosemide (LASIX) 40 MG tablet Take 1 tablet (40 mg  total) by mouth daily. Patient taking differently: Take 40 mg by mouth every other day.  02/11/15  Yes [name], PA-C  ibuprofen (ADVIL,MOTRIN) 600 MG tablet Take 600 mg by mouth every 6 (six) hours as needed for moderate pain.    Yes Historical Provider, MD  levothyroxine (SYNTHROID, LEVOTHROID) 112 MCG tablet Take 1 tablet (112 mcg total) by mouth daily. 02/15/15  Yes Thao P Le, DO  Melatonin 5 MG CAPS Take 5 mg by mouth at bedtime.   Yes Historical Provider, MD  Multiple Vitamin (MULTIVITAMIN) tablet Take 1 tablet  by mouth daily.   Yes Historical Provider, MD  ondansetron (ZOFRAN) 4 MG tablet Take 4 mg by mouth every 8 (eight) hours as needed for nausea or vomiting.   Yes Historical Provider, MD  pantoprazole (PROTONIX) 40 MG tablet Take 40 mg by mouth daily.   Yes Historical Provider, MD  potassium chloride SA (K-DUR,KLOR-CON) 20 MEQ tablet Take 1 tablet (20 mEq total) by mouth daily. 02/11/15  Yes [name], PA-C  ranitidine (ZANTAC) 150 MG tablet Take 150 mg by mouth 2 (two) times daily.   Yes Historical Provider, MD  benazepril-hydrochlorthiazide (LOTENSIN HCT) 20-25 MG per tablet Take 1 tablet by mouth daily. Patient not taking: Reported on 04/25/2015 02/11/15   [name], PA-C    Current Facility-Administered Medications  Medication Dose Route Frequency Provider Last Rate Last Dose  . pantoprazole (PROTONIX) 80 mg in sodium chloride 0.9 % 250 mL (0.32 mg/mL) infusion  8 mg/hr Intravenous Continuous Rolland Porter, MD 25 mL/hr at 04/25/15 1331 8 mg/hr at 04/25/15 1331  . [START ON 04/29/2015] pantoprazole (PROTONIX) injection 40 mg  40 mg Intravenous Q12H Rolland Porter, MD       Current Outpatient Prescriptions  Medication Sig Dispense Refill  . Cholecalciferol (VITAMIN D PO) Take 1 tablet by mouth daily as needed (supplement).     Marland Kitchen dextrose 5 % SOLN 50 mL with magnesium sulfate 50 % SOLN 1 g Inject 1 g into the vein daily as needed (pain).    Marland Kitchen diphenhydrAMINE (SOMINEX) 25 MG tablet Take 25 mg by mouth at bedtime as needed for sleep.    Marland Kitchen estrogens, conjugated, (PREMARIN) 1.25 MG tablet Take 1.25 mg by mouth daily.    . furosemide (LASIX) 40 MG tablet Take 1 tablet (40 mg total) by mouth daily. (Patient taking differently: Take 40 mg by mouth every other day. ) 30 tablet 3  . ibuprofen (ADVIL,MOTRIN) 600 MG tablet Take 600 mg by mouth every 6 (six) hours as needed for moderate pain.     Marland Kitchen levothyroxine (SYNTHROID, LEVOTHROID) 112 MCG tablet Take 1 tablet (112 mcg total) by mouth daily. 90 tablet 1  .  Melatonin 5 MG CAPS Take 5 mg by mouth at bedtime.    . Multiple Vitamin (MULTIVITAMIN) tablet Take 1 tablet by mouth daily.    . ondansetron (ZOFRAN) 4 MG tablet Take 4 mg by mouth every 8 (eight) hours as needed for nausea or vomiting.    . pantoprazole (PROTONIX) 40 MG tablet Take 40 mg by mouth daily.    . potassium chloride SA (K-DUR,KLOR-CON) 20 MEQ tablet Take 1 tablet (20 mEq total) by mouth daily. 30 tablet 3  . ranitidine (ZANTAC) 150 MG tablet Take 150 mg by mouth 2 (two) times daily.    . benazepril-hydrochlorthiazide (LOTENSIN HCT) 20-25 MG per tablet Take 1 tablet by mouth daily. (Patient not taking: Reported on 04/25/2015) 90 tablet 3    Allergies as of  04/25/2015 - Review Complete 04/25/2015  Allergen Reaction Noted  . Cocoa Anaphylaxis 10/27/2012  . Red dye Hives 01/15/2013  . Lyrica [pregabalin] Other (See Comments) 02/11/2015  . Sulfa antibiotics Rash 10/27/2012    Family History  Problem Relation Age of Onset  . Heart disease Mother   . Stroke Mother   . Cancer Mother   . COPD Father   . Cancer Father   . Diabetes Father   . Hypertension Father   . Stroke Maternal Grandmother   . Mental illness Maternal Grandmother   . Cancer Paternal Grandmother     History   Social History  . Marital Status: Married    Spouse Name: N/A  . Number of Children: N/A  . Years of Education: N/A   Occupational History  . RN     ICU RN [employer]   Social History Main Topics  . Smoking status: Never Smoker   . Smokeless tobacco: Not on file  . Alcohol Use: 16.8 oz/week    28 Glasses of wine per week     Comment: drinks daily and has a bottle of wine per day  . Drug Use: No  . Sexual Activity: Yes    Birth Control/ Protection: Post-menopausal   Other Topics Concern  . 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      Alcohol: drinks bottle of wine daily in 2016    Review of Systems: As per  history of present illness, otherwise negative  Physical Exam: Vital signs in last 24 hours: Temp:  [97.9 F (36.6 C)] 97.9 F (36.6 C) (05/29 1136) Pulse Rate:  [74-88] 76 (05/29 1233) Resp:  [15-18] 15 (05/29 1233) BP: (111-120)/(54-64) 113/55 mmHg (05/29 1233) SpO2:  [97 %-100 %] 97 % (05/29 1233)   General:   Alert,  Well-developed, well-nourished, pleasant and cooperative in NAD Head:  Normocephalic and atraumatic. Eyes:  Sclera clear, no icterus.   Conjunctiva pink. Ears:  Normal auditory acuity. Nose:  No deformity, discharge,  or lesions. Mouth:  No deformity or lesions.   Neck:  Supple; no masses or thyromegaly. Lungs:  Clear throughout to auscultation.   No wheezes, crackles, or rhonchi.  Heart:  Regular rate and rhythm; no murmurs, clicks, rubs,  or gallops. Abdomen:  Soft,nontender, BS active Rectal:  Deferred  Msk:  Symmetrical without gross deformities. . Pulses:  Normal pulses noted. Extremities:  Without clubbing or edema. Neurologic:  Alert and  oriented x4;  grossly normal neurologically, no asterixis Skin:  Intact, scattered angiomata anterior chest, no palmar erythema Psych:  Alert and cooperative. Normal mood and affect.  Intake/Output from previous day:   Intake/Output this shift:    Lab Results:  Recent Labs  04/25/15 0937 04/25/15 1258  WBC 7.1 5.7  HGB 10.6* 9.8*  HCT 32.4* 28.7*  PLT  --  208   BMET  Recent Labs  04/25/15 0937  NA 130*  K 3.9  CL 91*  CO2 30  GLUCOSE 112*  BUN 22  CREATININE 1.04  CALCIUM 8.8   LFT  Recent Labs  04/25/15 0937  PROT 5.9*  ALBUMIN 3.6  AST 26  ALT 24  ALKPHOS 74  BILITOT 0.8   PT/[name]  Recent Labs  04/25/15 1258  [name] 14.2  [name] 1.08     IMPRESSION:  69 y.o. female with past medical history of morbid obesity status post gastric  bypass in approximately 2003 at [facility], alcohol abuse, colon polyps, hypertension and hypothyroidism who presents to the ER after having 2 episodes of  bright red bloody emesis followed by 6-7 episodes of maroon stool.  PLAN: Upper GI bleeding, differential includes variceal hemorrhage or gastric/anastomotic ulceration in the setting of NSAID and alcohol use. She has acute posthemorrhagic anemia but vital signs have remained stable while here. No further hematemesis or hematochezia since coming to the ER. PPI drip has been started. Platelets are normal and [name] also normal arguing against severe synthetic liver dysfunction and portal hypertension. --Urgent EGD recommended.  The nature of the procedure, as well as the risks, benefits, and alternatives were carefully and thoroughly reviewed with the patient. Ample time for discussion and questions allowed. The patient understood, was satisfied, and agreed to proceed.  --Monitor hemoglobin closely --Alcohol withdrawal protocol     Corneluis Allston M  04/25/2015, 2:31 PM

## 2015-04-25 NOTE — Progress Notes (Signed)
Subjective:    Patient ID: [name], female    DOB: [birth year], 69 y.o.   MRN: [id number]  04/25/2015  Emesis and dark stools   HPI This 69 y.o. female presents for evaluation of hematemesis and black stools.  Working at night as Chief Executive Officer.  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  Onset yesterday at 2:00pm.  Drinks too much; drinking 1 bottle of wine per day.  Onset six months ago.  Had a really bad night at work; ate fried chicken and potatoe salad.  Vomited up.  Has been vomiting up all meals for two months.  Went to bed; slept 2 hours.  Gotup and started projectile vomiting with bright red.  Went back to bed.  No more trouble.  Did not eat anything.  No water.  At 8:00pm had eight dark marron stools with clots.  First stool was very large; passed lots of clots.  Took friend's protonix.  Took two doses.  Was sipping on Gatorade; also took Zantac.  Got nauseated; took Zofran.  Had something to drink yesterday morning.    ICU at [facility].  Does not want to go to [facility] ED.  Just implemented EPIC two months ago; very stressful to manage critical patients with EPIC.  Denies SI or HI.  Denies depressive symptoms; still finds joy and pleasure in things. Husband is very supportive and willing to quit drinking.   Review of Systems  Constitutional: Negative for fever, chills, diaphoresis and fatigue.  Respiratory: Negative for cough and shortness of breath.   Cardiovascular: Positive for leg swelling. Negative for chest pain and palpitations.  Gastrointestinal: Positive for nausea, vomiting, abdominal pain, diarrhea and blood in stool. Negative for constipation, abdominal distention, anal bleeding and rectal pain.  Skin: Negative for rash.  Neurological: Negative for dizziness, facial asymmetry, light-headedness and headaches.  Psychiatric/Behavioral: Negative for dysphoric mood. The patient is not nervous/anxious.     Past Medical History  Diagnosis Date  . Hypertension   . Arthritis   .  Degenerative disc disease, cervical   . Hypothyroidism   . Allergy   . Cataract   . 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 Substance abuse     Alcohol abuse 2016   Past Surgical History  Procedure Laterality Date  . Gastric bypass    . Diagnostic laparoscopy    . Open reduction internal fixation (orif) distal radial fracture Right 01/15/2013    Procedure: OPEN REDUCTION INTERNAL FIXATION (ORIF) Right DISTAL RADIUS FRACTURE;  Surgeon: Eldred Manges, MD;  Location: [facility] OR;  Service: Orthopedics;  Laterality: Right;  . Cholecystectomy    . Cosmetic surgery    . Fracture surgery     Allergies  Allergen Reactions  . Cocoa Anaphylaxis  . Red Dye Hives    Rash and Nausea diarrhea  . Lyrica [Pregabalin]   . Sulfa Antibiotics Rash   Current Outpatient Prescriptions  Medication Sig Dispense Refill  . benazepril-hydrochlorthiazide (LOTENSIN HCT) 20-25 MG per tablet Take 1 tablet by mouth daily. 90 tablet 3  . estrogens, conjugated, (PREMARIN) 1.25 MG tablet Take 1.25 mg by mouth daily.    . furosemide (LASIX) 40 MG tablet Take 1 tablet (40 mg total) by mouth daily. 30 tablet 3  . ibuprofen (ADVIL,MOTRIN) 600 MG tablet Take 600 mg by mouth every 6 (six) hours as needed.    Marland Kitchen levothyroxine (SYNTHROID, LEVOTHROID) 112 MCG tablet Take 1 tablet (112 mcg  total) by mouth daily. 90 tablet 1  . Multiple Vitamin (MULTIVITAMIN) tablet Take 1 tablet by mouth daily.    . ondansetron (ZOFRAN) 4 MG tablet Take 4 mg by mouth every 8 (eight) hours as needed for nausea or vomiting.    . Pantoprazole Sodium (PROTONIX PO) Take by mouth.    . ranitidine (ZANTAC) 150 MG tablet Take 150 mg by mouth 2 (two) times daily.    . Cholecalciferol (VITAMIN D PO) Take by mouth.    . potassium chloride SA (K-DUR,KLOR-CON) 20 MEQ tablet Take 1 tablet (20 mEq total) by mouth daily. (Patient not taking: Reported on 04/25/2015) 30 tablet 3   No current facility-administered  medications for this visit.       Objective:    BP 132/72 mmHg  Pulse 81  Temp(Src) 98.3 F (36.8 C) (Oral)  Resp 18  Ht 5' 5.5" (1.664 m)  Wt 228 lb (103.42 kg)  BMI 37.35 kg/m2  SpO2 98% Physical Exam  Constitutional: She is oriented to person, place, and time. She appears well-developed and well-nourished. No distress.  HENT:  Head: Normocephalic and atraumatic.  Right Ear: External ear normal.  Left Ear: External ear normal.  Nose: Nose normal.  Mouth/Throat: Oropharynx is clear and moist.  Eyes: Conjunctivae and EOM are normal. Pupils are equal, round, and reactive to light.  Neck: Normal range of motion. Neck supple. Carotid bruit is not present. No thyromegaly present.  Cardiovascular: Normal rate, regular rhythm, normal heart sounds and intact distal pulses.  Exam reveals no gallop and no friction rub.   No murmur heard. Pulmonary/Chest: Effort normal and breath sounds normal. She has no wheezes. She has no rales.  Abdominal: Soft. Bowel sounds are normal. She exhibits no distension and no mass. There is tenderness. There is no rebound and no guarding.  Genitourinary: Rectal exam shows external hemorrhoid.  +residual dried blood in perianal region.  Lymphadenopathy:    She has no cervical adenopathy.  Neurological: She is alert and oriented to person, place, and time. No cranial nerve deficit.  Skin: Skin is warm and dry. No rash noted. She is not diaphoretic. No erythema. No pallor.  Psychiatric: She has a normal mood and affect. Her behavior is normal.   Results for orders placed or performed in visit on 04/25/15  POCT CBC  Result Value Ref Range   WBC 7.1 4.6 - 10.2 K/uL   Lymph, poc 2.4 0.6 - 3.4   POC LYMPH PERCENT 34.1 10 - 50 %L   MID (cbc) 0.6 0 - 0.9   POC MID % 8.4 0 - 12 %M   POC Granulocyte 4.1 2 - 6.9   Granulocyte percent 57.5 37 - 80 %G   RBC 3.01 (A) 4.04 - 5.48 M/uL   Hemoglobin 10.6 (A) 12.2 - 16.2 g/dL   HCT, POC 16.1 (A) 09.6 - 47.9 %    MCV 107.5 (A) 80 - 97 fL   MCH, POC 35.4 (A) 27 - 31.2 pg   MCHC 32.9 31.8 - 35.4 g/dL   RDW, POC 04.5 %   Platelet Count, POC 251 142 - 424 K/uL   MPV 6.4 0 - 99.8 fL  IFOBT POC (occult bld, rslt in office)  Result Value Ref Range   IFOBT Positive   POCT urinalysis dipstick  Result Value Ref Range   Color, UA yellow    Clarity, UA hazy    Glucose, UA neg    Bilirubin, UA small    Ketones, UA  trace    Spec Grav, UA 1.010    Blood, UA small    pH, UA 5.5    Protein, UA neg    Urobilinogen, UA 0.2    Nitrite, UA neg    Leukocytes, UA Negative   POCT UA - Microscopic Only  Result Value Ref Range   WBC, Ur, HPF, POC 0-2    RBC, urine, microscopic 3-5    Bacteria, U Microscopic trace    Mucus, UA neg    Epithelial cells, urine per micros 2-3    Crystals, Ur, HPF, POC neg    Casts, Ur, LPF, POC neg    Yeast, UA neg    Orthostatic VS for the past 24 hrs:  BP- Lying Pulse- Lying BP- Sitting Pulse- Sitting BP- Standing at 0 minutes Pulse- Standing at 0 minutes  04/25/15 0926 125/78 mmHg 79 110/71 mmHg 80 106/71 mmHg 81        Assessment & Plan:   1. Hematemesis with nausea   2. Bloody stools   3. Alcohol abuse   4. Acute blood loss anemia    -New. -To ED immediately for evaluation and admission. -Asking for help with alcohol cessation.   -Hemodynamically stable at this time; thus, agreeable to husband transporting to ED.   Meds ordered this encounter  Medications  . ranitidine (ZANTAC) 150 MG tablet    Sig: Take 150 mg by mouth 2 (two) times daily.  . Pantoprazole Sodium (PROTONIX PO)    Sig: Take by mouth.  . ondansetron (ZOFRAN) 4 MG tablet    Sig: Take 4 mg by mouth every 8 (eight) hours as needed for nausea or vomiting.    No Follow-up on file.     Comer Devins Paulita FujitaMartin Akiyah Eppolito, M.D. [facility]  [place] [address] [place], [place]  [id number] [phone] phone [phone] fax

## 2015-04-25 NOTE — ED Provider Notes (Signed)
CSN: [id number]     Arrival date & time 04/25/15  1130 History   First MD Initiated Contact with Patient 04/25/15 1147     Chief Complaint  Patient presents with  . GI Bleeding      HPI  She presents for evaluation of lower GI bleeding. She works as an Insurance underwriter. States "I know what Shawna Orleans looks like". States that she vomited twice last night and it was bloody. Normal stools this morning. She takes Motrin 600 mg 3 times a day for chronic pain. States that her life has been increasingly stressful over the last several months that she has gone from drinking a glass of wine to a bottle of wine per day. Does not typically have withdrawal symptoms. However, freely admits that she has been more than several months that she's had less than a bottle of wine in an evening. No previous history of GI bleeding.  Past Medical History  Diagnosis Date  . Hypertension   . Arthritis   . Degenerative disc disease, cervical   . Hypothyroidism   . Allergy   . Cataract   . 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 Substance abuse     Alcohol abuse 2016   Past Surgical History  Procedure Laterality Date  . Gastric bypass    . Diagnostic laparoscopy    . Open reduction internal fixation (orif) distal radial fracture Right 01/15/2013    Procedure: OPEN REDUCTION INTERNAL FIXATION (ORIF) Right DISTAL RADIUS FRACTURE;  Surgeon: Eldred Manges, MD;  Location: [facility] OR;  Service: Orthopedics;  Laterality: Right;  . Cholecystectomy    . Cosmetic surgery    . Fracture surgery     Family History  Problem Relation Age of Onset  . Heart disease Mother   . Stroke Mother   . Cancer Mother   . COPD Father   . Cancer Father   . Diabetes Father   . Hypertension Father   . Stroke Maternal Grandmother   . Mental illness Maternal Grandmother   . Cancer Paternal Grandmother    History  Substance Use Topics  . Smoking status: Never Smoker   . Smokeless tobacco: Not  on file  . Alcohol Use: 16.8 oz/week    28 Glasses of wine per week     Comment: drinks daily and has a bottle of wine per day   OB History    No data available     Review of Systems  Constitutional: Negative for fever, chills, diaphoresis, appetite change and fatigue.  HENT: Negative for mouth sores, sore throat and trouble swallowing.   Eyes: Negative for visual disturbance.  Respiratory: Negative for cough, chest tightness, shortness of breath and wheezing.   Cardiovascular: Negative for chest pain.  Gastrointestinal: Positive for blood in stool. Negative for nausea, vomiting, abdominal pain, diarrhea and abdominal distention.  Endocrine: Negative for polydipsia, polyphagia and polyuria.  Genitourinary: Negative for dysuria, frequency and hematuria.  Musculoskeletal: Negative for gait problem.  Skin: Negative for color change, pallor and rash.  Neurological: Negative for dizziness, syncope, light-headedness and headaches.  Hematological: Does not bruise/bleed easily.  Psychiatric/Behavioral: Negative for behavioral problems and confusion.      Allergies  Cocoa; Red dye; Lyrica; and Sulfa antibiotics  Home Medications   Prior to Admission medications   Medication Sig Start Date End Date Taking? Authorizing Provider  Cholecalciferol (VITAMIN D PO) Take 1 tablet by mouth daily as needed (supplement).  Yes Historical Provider, MD  dextrose 5 % SOLN 50 mL with magnesium sulfate 50 % SOLN 1 g Inject 1 g into the vein daily as needed (pain).   Yes Historical Provider, MD  diphenhydrAMINE (SOMINEX) 25 MG tablet Take 25 mg by mouth at bedtime as needed for sleep.   Yes Historical Provider, MD  estrogens, conjugated, (PREMARIN) 1.25 MG tablet Take 1.25 mg by mouth daily.   Yes Historical Provider, MD  furosemide (LASIX) 40 MG tablet Take 1 tablet (40 mg total) by mouth daily. Patient taking differently: Take 40 mg by mouth every other day.  02/11/15  Yes Wallis BambergMario Mani, PA-C  ibuprofen  (ADVIL,MOTRIN) 600 MG tablet Take 600 mg by mouth every 6 (six) hours as needed for moderate pain.    Yes Historical Provider, MD  levothyroxine (SYNTHROID, LEVOTHROID) 112 MCG tablet Take 1 tablet (112 mcg total) by mouth daily. 02/15/15  Yes Thao P Le, DO  Melatonin 5 MG CAPS Take 5 mg by mouth at bedtime.   Yes Historical Provider, MD  Multiple Vitamin (MULTIVITAMIN) tablet Take 1 tablet by mouth daily.   Yes Historical Provider, MD  ondansetron (ZOFRAN) 4 MG tablet Take 4 mg by mouth every 8 (eight) hours as needed for nausea or vomiting.   Yes Historical Provider, MD  pantoprazole (PROTONIX) 40 MG tablet Take 40 mg by mouth daily.   Yes Historical Provider, MD  potassium chloride SA (K-DUR,KLOR-CON) 20 MEQ tablet Take 1 tablet (20 mEq total) by mouth daily. 02/11/15  Yes Wallis BambergMario Mani, PA-C  ranitidine (ZANTAC) 150 MG tablet Take 150 mg by mouth 2 (two) times daily.   Yes Historical Provider, MD   BP 113/55 mmHg  Pulse 76  Temp(Src) 97.9 F (36.6 C) (Oral)  Resp 15  SpO2 97% Physical Exam  Constitutional: She is oriented to person, place, and time. She appears well-developed and well-nourished. No distress.  HENT:  Head: Normocephalic.  Eyes: Conjunctivae are normal. Pupils are equal, round, and reactive to light. No scleral icterus.  Conjunctivae do not appear pale.  Neck: Normal range of motion. Neck supple. No thyromegaly present.  Cardiovascular: Normal rate and regular rhythm.  Exam reveals no gallop and no friction rub.   No murmur heard. Pulmonary/Chest: Effort normal and breath sounds normal. No respiratory distress. She has no wheezes. She has no rales.  Abdominal: Soft. Bowel sounds are normal. She exhibits no distension. There is no tenderness. There is no rebound.  Musculoskeletal: Normal range of motion.  Neurological: She is alert and oriented to person, place, and time.  Skin: Skin is warm and dry. No rash noted.  Psychiatric: She has a normal mood and affect. Her behavior  is normal.    ED Course  Procedures (including critical care time) Labs Review Labs Reviewed  CBC - Abnormal; Notable for the following:    RBC 2.77 (*)    Hemoglobin 9.8 (*)    HCT 28.7 (*)    MCV 103.6 (*)    MCH 35.4 (*)    All other components within normal limits  PROTIME-[name]  COMPREHENSIVE METABOLIC PANEL  TYPE AND SCREEN    Imaging Review No results found.   EKG Interpretation None      MDM   Final diagnoses:  UGIB (upper gastrointestinal bleed)    She remained hemodynamically stable.  Hemoglobin 9.8. Care discussed with GI, as well as hospitalist. Type and screen obtained. Patient will be admitted for upper endoscopy.   Rolland PorterMark Alixandra Alfieri, MD 04/28/15 [phone]

## 2015-04-25 NOTE — ED Notes (Signed)
GI at the bedside.

## 2015-04-25 NOTE — ED Notes (Signed)
Procedure finished and pt has a room assignment

## 2015-04-25 NOTE — Progress Notes (Addendum)
Patient trasfered from ED to [phone] via stretcher; alert and oriented x 4; no complaints of pain; IV saline locked in LFA with NS@75cc /hr and Protonix drip@25cc /hr; skin intact, with ecchymosis on BLE and BUE and sun marks on her upper back and right shoulder. Orient patient to room and unit; gave patient care guide; instructed how to use the call bell and  fall risk precautions. Will continue to monitor the patient.

## 2015-04-25 NOTE — ED Notes (Signed)
Endo at bedside preparing room and pt.

## 2015-04-25 NOTE — Patient Instructions (Signed)
1. Please present immediately to the Emergency Department for further evaluation.

## 2015-04-25 NOTE — ED Notes (Signed)
Dr. James at the bedside.  

## 2015-04-25 NOTE — Progress Notes (Signed)
Patient had one more bloody stool. Hemoglobin already ordered. NP Claiborne Billingsallahan notified. Will continue to monitor

## 2015-04-26 LAB — CBC
HCT: 24.9 % — ABNORMAL LOW (ref 36.0–46.0)
Hemoglobin: 8.4 g/dL — ABNORMAL LOW (ref 12.0–15.0)
MCH: 35.7 pg — ABNORMAL HIGH (ref 26.0–34.0)
MCHC: 33.7 g/dL (ref 30.0–36.0)
MCV: 106 fL — ABNORMAL HIGH (ref 78.0–100.0)
Platelets: 172 10*3/uL (ref 150–400)
RBC: 2.35 MIL/uL — ABNORMAL LOW (ref 3.87–5.11)
RDW: 13 % (ref 11.5–15.5)
WBC: 4.4 10*3/uL (ref 4.0–10.5)

## 2015-04-26 LAB — BASIC METABOLIC PANEL
Anion gap: 7 (ref 5–15)
BUN: 17 mg/dL (ref 6–20)
CO2: 27 mmol/L (ref 22–32)
Calcium: 8 mg/dL — ABNORMAL LOW (ref 8.9–10.3)
Chloride: 99 mmol/L — ABNORMAL LOW (ref 101–111)
Creatinine, Ser: 1.06 mg/dL — ABNORMAL HIGH (ref 0.44–1.00)
GFR calc Af Amer: >60 mL/min (ref >60)
GFR calc non Af Amer: 53 mL/min — ABNORMAL LOW (ref >60)
Glucose, Bld: 100 mg/dL — ABNORMAL HIGH (ref 65–99)
Potassium: 3.3 mmol/L — ABNORMAL LOW (ref 3.5–5.1)
Sodium: 133 mmol/L — ABNORMAL LOW (ref 135–145)

## 2015-04-26 LAB — HEMOGLOBIN AND HEMATOCRIT, BLOOD
HCT: 27.5 % — ABNORMAL LOW (ref 36.0–46.0)
Hemoglobin: 9.2 g/dL — ABNORMAL LOW (ref 12.0–15.0)

## 2015-04-26 MED ORDER — LORAZEPAM 1 MG PO TABS
1.0000 mg | ORAL_TABLET | Freq: Two times a day (BID) | ORAL | Status: DC | PRN
  Administered 2015-04-27 (×2): 1 mg via ORAL
  Filled 2015-04-26 (×2): qty 1

## 2015-04-26 NOTE — Progress Notes (Signed)
    Progress Note   Subjective  no abdominal pain. No nausea. Just had a BM some maroon clots. Amount of blood decreasing   Objective   Vital signs in last 24 hours: Temp:  [97.8 F (36.6 C)-98 F (36.7 C)] 97.8 F (36.6 C) (05/30 0543) Pulse Rate:  [70-88] 70 (05/30 0543) Resp:  [11-23] 18 (05/30 0543) BP: (84-128)/(38-71) 119/57 mmHg (05/30 0543) SpO2:  [96 %-100 %] 98 % (05/30 0543) Weight:  [232 lb 5.8 oz (105.4 kg)] 232 lb 5.8 oz (105.4 kg) (05/29 1729) Last BM Date: 04/25/15 General:    Pleasant white female in NAD Abdomen:  Soft, nontender and nondistended. Normal bowel sounds. Extremities:  Without edema. Neurologic:  Alert and oriented,  grossly normal neurologically. Psych:  Cooperative. Normal mood and affect.  Lab Results:  Recent Labs  04/25/15 0937 04/25/15 1258  04/25/15 2105 04/26/15 0331 04/26/15 1032  WBC 7.1 5.7  --   --  4.4  --   HGB 10.6* 9.8*  < > 8.8* 8.4* 9.2*  HCT 32.4* 28.7*  < > 25.9* 24.9* 27.5*  PLT  --  208  --   --  172  --   < > = values in this interval not displayed. BMET  Recent Labs  04/25/15 0937 04/25/15 1258 04/26/15 0331  NA 130* 129* 133*  K 3.9 3.7 3.3*  CL 91* 89* 99*  CO2 30 30 27   GLUCOSE 112* 104* 100*  BUN 22 22* 17  CREATININE 1.04 1.19* 1.06*  CALCIUM 8.8 8.5* 8.0*   LFT  Recent Labs  04/25/15 1258  PROT 5.6*  ALBUMIN 3.2*  AST 27  ALT 24  ALKPHOS 73  BILITOT 0.8   PT/[name]  Recent Labs  04/25/15 1258  [name] 14.2  [name] 1.08      Assessment / Plan:   521. 69 year old female with upper GI bleed. EGD yesterday revealed a deep anastomotic ulcer (gastric bypass) with visible vessel, s/p  epi injection, hemoclip placement and cautery yesterday. She had two bloody BMs last night. This am she had a BM with some maroon clots. Patient believes the amount of blood in stools is decreasing. Her hgb is stable, actually up overnight from 6.4 to 9.2 (without transfusion). Continue clears for now. Continue PPI  drip for total of 72 hours.   2. ETOH abuse      LOS: 1 day   Willette Clusteraula Hue Frick  04/26/2015, 11:25 AM

## 2015-04-26 NOTE — Progress Notes (Addendum)
[facility] PROGRESS NOTE  Assessment/Plan: Anemia associated with acute blood loss GI bleed/Acute anastomotic ulcer of gastrojejunal region: - EGD on 5.30.2016 revealed a deep anastomotic ulcer (gastric bypass) with visible vessel, s/p epi injection, hemoclip placement and cautery. - had some black stools, probably residual. - Hbg has stabilize, cont PPI.  Essential  HTN (hypertension) - at goal.  Alcohol abuse - Started on CIWA protocol no events. - Change ativan to schedule.     Hyponatremia: - cont IV fluids, likely pre-renal.    Code Status: full Family Communication: none  Disposition Plan: home in am   Consultants:  GI  Procedures:  EGD  Antibiotics:  None  HPI/Subjective: relates no further, abdominal pain  Objective: Filed Vitals:   04/25/15 1622 04/25/15 1729 04/26/15 0000 04/26/15 0543  BP: 127/57 107/49 107/47 119/57  Pulse:  72 74 70  Temp:  98 F (36.7 C) 97.8 F (36.6 C) 97.8 F (36.6 C)  TempSrc:  Oral Oral Oral  Resp: Height:   (1.651 m)    Weight:  105.4 kg (232 lb 5.8 oz)    SpO2: 100% 96% 97% 98%    Intake/Output Summary (Last 24 hours) at 04/26/15 1209 Last data filed at 04/26/15 0949  Gross per 24 hour  Intake 1569.58 ml  Output   1600 ml  Net -30.42 ml   Filed Weights   04/25/15 1729  Weight: 105.4 kg (232 lb 5.8 oz)    Exam:  General: Alert, awake, oriented x3, in no acute distress.  HEENT: No bruits, no goiter.  Heart: Regular rate and rhythm. Lungs: Good air movement, clear Abdomen: Soft, epigastric tenderness, nondistended, positive bowel sounds.  Neuro: Grossly intact, nonfocal.   Data Reviewed: Basic Metabolic Panel:  Recent Labs Lab 04/25/15 0937 04/25/15 1258 04/26/15 0331  NA 130* 129* 133*  K 3.9 3.7 3.3*  CL 91* 89* 99*  CO2 GLUCOSE 112* 104* 100*  BUN 22 22* 17  CREATININE 1.04 1.19* 1.06*  CALCIUM 8.8 8.5* 8.0*   Liver Function Tests:  Recent  Labs Lab 04/25/15 0937 04/25/15 1258  AST 26 27  ALT 24 24  ALKPHOS 74 73  BILITOT 0.8 0.8  PROT 5.9* 5.6*  ALBUMIN 3.6 3.2*   No results for input(s): LIPASE, AMYLASE in the last 168 hours. No results for input(s): AMMONIA in the last 168 hours. CBC:  Recent Labs Lab 04/25/15 0937 04/25/15 1258 04/25/15 1801 04/25/15 2105 04/26/15 0331 04/26/15 1032  WBC 7.1 5.7  --   --  4.4  --   HGB 10.6* 9.8* 9.0* 8.8* 8.4* 9.2*  HCT 32.4* 28.7* 26.5* 25.9* 24.9* 27.5*  MCV 107.5* 103.6*  --   --  106.0*  --   PLT  --  208  --   --  172  --    Cardiac Enzymes: No results for input(s): [name], [name], CKMBINDEX, TROPONINI in the last 168 hours. BNP (last 3 results) No results for input(s): BNP in the last 8760 hours.  ProBNP (last 3 results) No results for input(s): PROBNP in the last 8760 hours.  CBG: No results for input(s): GLUCAP in the last 168 hours.  No results found for this or any previous visit (from the past 240 hour(s)).   Studies: No results found.  Scheduled Meds: . folic acid  1 mg Oral Daily  . levothyroxine  112 mcg Oral QAC breakfast  . multivitamin  1 tablet Oral Daily  . [  START ON 04/29/2015] pantoprazole (PROTONIX) IV  40 mg Intravenous Q12H  . potassium chloride SA  20 mEq Oral Daily  . thiamine  100 mg Oral Daily   Or  . thiamine  100 mg Intravenous Daily   Continuous Infusions: . sodium chloride 75 mL/hr at 04/26/15 1113  . sodium chloride    . pantoprozole (PROTONIX) infusion 8 mg/hr (04/26/15 1113)    Time Spent: 25 min   Marinda ElkFELIZ ORTIZ, ABRAHAM  [facility] Pager [phone].  If 7PM-7AM, please contact night-coverage at [email or web address], password [email or web address] 04/26/2015, 12:09 PM  LOS: 1 day

## 2015-04-26 NOTE — Progress Notes (Signed)
PT Cancellation Note  Patient Details Name: Johnnette BarriosLynda H Angerer MRN: [id number] DOB: [birth year]   Cancelled Treatment:    Reason Eval/Treat Not Completed: PT screened, no needs identified, will sign off. Pt mobilizing independently without difficulty.   Geoff Dacanay 04/26/2015, 12:12 PM  [facility] Cowher PT [phone]

## 2015-04-27 ENCOUNTER — Encounter (HOSPITAL_COMMUNITY): Payer: Self-pay | Admitting: Internal Medicine

## 2015-04-27 DIAGNOSIS — K264 Chronic or unspecified duodenal ulcer with hemorrhage: Secondary | ICD-10-CM

## 2015-04-27 LAB — CBC
HCT: 28.7 % — ABNORMAL LOW (ref 36.0–46.0)
Hemoglobin: 9.5 g/dL — ABNORMAL LOW (ref 12.0–15.0)
MCH: 36.4 pg — ABNORMAL HIGH (ref 26.0–34.0)
MCHC: 33.1 g/dL (ref 30.0–36.0)
MCV: 110 fL — ABNORMAL HIGH (ref 78.0–100.0)
Platelets: 219 10*3/uL (ref 150–400)
RBC: 2.61 MIL/uL — ABNORMAL LOW (ref 3.87–5.11)
RDW: 13.4 % (ref 11.5–15.5)
WBC: 5.8 10*3/uL (ref 4.0–10.5)

## 2015-04-27 MED ORDER — PANTOPRAZOLE SODIUM 40 MG PO TBEC: 40.0000 mg | DELAYED_RELEASE_TABLET | Freq: Two times a day (BID) | ORAL | Status: DC

## 2015-04-27 MED ORDER — DIPHENHYDRAMINE HCL 25 MG PO CAPS
25.0000 mg | ORAL_CAPSULE | Freq: Three times a day (TID) | ORAL | Status: DC | PRN
  Administered 2015-04-27: 25 mg via ORAL
  Filled 2015-04-27: qty 1

## 2015-04-27 MED ORDER — PANTOPRAZOLE SODIUM 40 MG PO TBEC
40.0000 mg | DELAYED_RELEASE_TABLET | Freq: Two times a day (BID) | ORAL | Status: DC
  Administered 2015-04-27: 40 mg via ORAL
  Filled 2015-04-27: qty 1

## 2015-04-27 MED ORDER — LORAZEPAM 1 MG PO TABS: 1.0000 mg | ORAL_TABLET | Freq: Two times a day (BID) | ORAL | Status: DC | PRN

## 2015-04-27 NOTE — Discharge Summary (Signed)
Physician Discharge Summary  [name]:[id number] DOB: 1946/10/12 DOA: 04/25/2015  PCP: No PCP Per Patient  Admit date: 04/25/2015 Discharge date: 04/27/2015  Time spent: 25 minutes  Recommendations for Outpatient Follow-up:  1. Follow-up with GI in 2-4 weeks continue Protonix by mouth twice a day.    Discharge Diagnoses:  Active Problems:   GI bleed   HTN (hypertension)   Alcohol abuse   Anemia associated with acute blood loss   Hyponatremia   Acute anastomotic ulcer of gastrojejunal region   Discharge Condition: stable  Diet recommendation: Regular  Filed Weights   04/25/15 1729  Weight: 105.4 kg (232 lb 5.8 oz)    History of present illness:  69 y.o. female, with past medical history of alcohol abuse, hypertension, hypothyroidism, morbid obesity in the past status post gastric bypass surgery at [facility] at 2003, patient presents with complaints of coffee-ground emesis and melanoma, patient endorses using Depo-Provera and 600 mg oral 3 times daily, heavy alcohol consumption one bottle of wine on a daily basis, patient reports yesterday p.m. she had 2 episodes of coffee-ground emesis, and yesterday 8 PM she started to have melanoma, 8 episodes overnight, patient reports she has ever drinking habits giving high stress at works as she works as an Insurance underwriter at [employer], she denies any history of gastric ulcers in the past, or GI bleed, patient hemoglobin was 9.8, most recent value we have before 2 years at 13.3, she reports she has been using ranitidine and comes for heartburn as needed, patient was seen by gastroenterology, who recommended urgent endoscopy for further evaluation,  Hospital Course:  Anemia associated with acute blood loss GI bleed/Acute anastomotic ulcer of gastrojejunal region: - Started on IV protonix, GI was consulted they recommended an EGD. - EGD on 5.30.2016 revealed a deep anastomotic ulcer (gastric bypass) with visible vessel, s/p epi  injection, hemoclip placement and cautery. - Hemoglobin stabilized, she'll continue protonic twice a day and follow-up with GI as an outpatient.  Essential HTN (hypertension) - at goal.  Alcohol abuse - No signs of withdrawals. She was started on thiamine and folate on admission empirically.  Anxiety: She was started on Ativan twice a day as needed.  Hyponatremia: - cont IV fluids, likely pre-renal.  Procedures:  EGD 5.30.2016  Consultations:  GI  Discharge Exam: Filed Vitals:   04/27/15 0622  BP: 100/48  Pulse: 72  Temp: 97.9 F (36.6 C)  Resp: 16    General: A&O x3 Cardiovascular: RRR Respiratory: good air movement CTA B/L  Discharge Instructions   Discharge Instructions    Diet - low sodium heart healthy    Complete by:  As directed      Increase activity slowly    Complete by:  As directed           Current Discharge Medication List    START taking these medications   Details  LORazepam (ATIVAN) 1 MG tablet Take 1 tablet (1 mg total) by mouth every 12 (twelve) hours as needed for anxiety or sleep. Qty: 30 tablet, Refills: 0      CONTINUE these medications which have CHANGED   Details  pantoprazole (PROTONIX) 40 MG tablet Take 1 tablet (40 mg total) by mouth 2 (two) times daily. Qty: 60 tablet, Refills: 0      CONTINUE these medications which have NOT CHANGED   Details  Cholecalciferol (VITAMIN D PO) Take 1 tablet by mouth daily as needed (supplement).     dextrose 5 %  SOLN 50 mL with magnesium sulfate 50 % SOLN 1 g Inject 1 g into the vein daily as needed (pain).    diphenhydrAMINE (SOMINEX) 25 MG tablet Take 25 mg by mouth at bedtime as needed for sleep.    estrogens, conjugated, (PREMARIN) 1.25 MG tablet Take 1.25 mg by mouth daily.    furosemide (LASIX) 40 MG tablet Take 1 tablet (40 mg total) by mouth daily. Qty: 30 tablet, Refills: 3    levothyroxine (SYNTHROID, LEVOTHROID) 112 MCG tablet Take 1 tablet (112 mcg total) by mouth  daily. Qty: 90 tablet, Refills: 1   Associated Diagnoses: Hypothyroidism, unspecified hypothyroidism type    Melatonin 5 MG CAPS Take 5 mg by mouth at bedtime.    Multiple Vitamin (MULTIVITAMIN) tablet Take 1 tablet by mouth daily.    ondansetron (ZOFRAN) 4 MG tablet Take 4 mg by mouth every 8 (eight) hours as needed for nausea or vomiting.    potassium chloride SA (K-DUR,KLOR-CON) 20 MEQ tablet Take 1 tablet (20 mEq total) by mouth daily. Qty: 30 tablet, Refills: 3      STOP taking these medications     ibuprofen (ADVIL,MOTRIN) 600 MG tablet      ranitidine (ZANTAC) 150 MG tablet        Allergies  Allergen Reactions  . Cocoa Anaphylaxis  . Red Dye Hives    Rash and Nausea diarrhea  . Lyrica [Pregabalin] Other (See Comments)    Abnormal bleeding  . Sulfa Antibiotics Rash      The results of significant diagnostics from this hospitalization (including imaging, microbiology, ancillary and laboratory) are listed below for reference.    Significant Diagnostic Studies: No results found.  Microbiology: No results found for this or any previous visit (from the past 240 hour(s)).   Labs: Basic Metabolic Panel:  Recent Labs Lab 04/25/15 0937 04/25/15 1258 04/26/15 0331  NA 130* 129* 133*  K 3.9 3.7 3.3*  CL 91* 89* 99*  CO2 30 30 27   GLUCOSE 112* 104* 100*  BUN 22 22* 17  CREATININE 1.04 1.19* 1.06*  CALCIUM 8.8 8.5* 8.0*   Liver Function Tests:  Recent Labs Lab 04/25/15 0937 04/25/15 1258  AST 26 27  ALT 24 24  ALKPHOS 74 73  BILITOT 0.8 0.8  PROT 5.9* 5.6*  ALBUMIN 3.6 3.2*   No results for input(s): LIPASE, AMYLASE in the last 168 hours. No results for input(s): AMMONIA in the last 168 hours. CBC:  Recent Labs Lab 04/25/15 0937 04/25/15 1258 04/25/15 1801 04/25/15 2105 04/26/15 0331 04/26/15 1032  WBC 7.1 5.7  --   --  4.4  --   HGB 10.6* 9.8* 9.0* 8.8* 8.4* 9.2*  HCT 32.4* 28.7* 26.5* 25.9* 24.9* 27.5*  MCV 107.5* 103.6*  --   --   106.0*  --   PLT  --  208  --   --  172  --    Cardiac Enzymes: No results for input(s): [name], [name], CKMBINDEX, TROPONINI in the last 168 hours. BNP: BNP (last 3 results) No results for input(s): BNP in the last 8760 hours.  ProBNP (last 3 results) No results for input(s): PROBNP in the last 8760 hours.  CBG: No results for input(s): GLUCAP in the last 168 hours.   Signed:  Marinda ElkFELIZ ORTIZ, Stein Windhorst  [facility] 04/27/2015, 8:53 AM

## 2015-04-27 NOTE — Progress Notes (Signed)
Johnnette BarriosLynda H Elbaum to be D/C'd Home per MD order.  Discussed with the patient and all questions fully answered.  VSS, Skin clean, dry and intact without evidence of skin break down, no evidence of skin tears noted. IV catheter discontinued intact. Site without signs and symptoms of complications. Dressing and pressure applied.  An After Visit Summary was printed and given to the patient. Patient received prescription.  D/c education completed with patient/family including follow up instructions, medication list, d/c activities limitations if indicated, with other d/c instructions as indicated by MD - patient able to verbalize understanding, all questions fully answered.   Patient instructed to return to ED, call 911, or call MD for any changes in condition.   Patient escorted via WC, and D/C home via private auto.  Beckey DowningFlores, Doyne Micke F 04/27/2015 6:35 PM

## 2015-04-27 NOTE — Progress Notes (Signed)
Subjective: No complaints.  No further bloody bowel movements for 12 hours.  Objective: Vital signs in last 24 hours: Temp:  [97.9 F (36.6 C)-98.7 F (37.1 C)] 97.9 F (36.6 C) (05/31 0622) Pulse Rate:  [72-84] 72 (05/31 0622) Resp:  [16-18] 16 (05/31 0622) BP: (100-139)/(48-63) 100/48 mmHg (05/31 0622) SpO2:  [97 %-99 %] 98 % (05/31 0622) Last BM Date: 04/26/15  Intake/Output from previous day: 05/30 0701 - 05/31 0700 In: 1800 [P.O.:600; I.V.:1200] Out: 1350 [Urine:1350] Intake/Output this shift:    General appearance: alert and no distress GI: soft, non-tender; bowel sounds normal; no masses,  no organomegaly  Lab Results:  Recent Labs  04/25/15 0937 04/25/15 1258  04/25/15 2105 04/26/15 0331 04/26/15 1032  WBC 7.1 5.7  --   --  4.4  --   HGB 10.6* 9.8*  < > 8.8* 8.4* 9.2*  HCT 32.4* 28.7*  < > 25.9* 24.9* 27.5*  PLT  --  208  --   --  172  --   < > = values in this interval not displayed. BMET  Recent Labs  04/25/15 0937 04/25/15 1258 04/26/15 0331  NA 130* 129* 133*  K 3.9 3.7 3.3*  CL 91* 89* 99*  CO2 30 30 27   GLUCOSE 112* 104* 100*  BUN 22 22* 17  CREATININE 1.04 1.19* 1.06*  CALCIUM 8.8 8.5* 8.0*   LFT  Recent Labs  04/25/15 1258  PROT 5.6*  ALBUMIN 3.2*  AST 27  ALT 24  ALKPHOS 73  BILITOT 0.8   PT/[name]  Recent Labs  04/25/15 1258  [name] 14.2  [name] 1.08   Hepatitis Panel No results for input(s): HEPBSAG, HCVAB, HEPAIGM, HEPBIGM in the last 72 hours. C-Diff No results for input(s): CDIFFTOX in the last 72 hours. Fecal Lactopherrin No results for input(s): FECLLACTOFRN in the last 72 hours.  Studies/Results: No results found.  Medications:  Scheduled: . folic acid  1 mg Oral Daily  . levothyroxine  112 mcg Oral QAC breakfast  . multivitamin  1 tablet Oral Daily  . [START ON 04/29/2015] pantoprazole (PROTONIX) IV  40 mg Intravenous Q12H  . potassium chloride SA  20 mEq Oral Daily  . thiamine  100 mg Oral Daily   Or  .  thiamine  100 mg Intravenous Daily   Continuous: . sodium chloride 75 mL/hr at 04/27/15 0020  . pantoprozole (PROTONIX) infusion 8 mg/hr (04/26/15 2240)    Assessment/Plan: 1) Anastomotic ulcer. 2) Anemia.   She is stable clinically.  No bleeding for 12 hours and the last blood count yesterday was elevated in the 9 range.  Plan: 1) Check CBC this AM. 2) Advance to a heart healthy diet. 3) If she tolerates PO without any issues and her HGB is stable, she can be discharged home. 4) Follow up in two weeks.  I will make the arrangements. 5) She is requesting 1-2 weeks off from work as a result of this hospitalization and stress at work, which I think is fine.   LOS: 2 days   [name] 04/27/2015, 7:52 AM

## 2015-04-28 ENCOUNTER — Encounter (HOSPITAL_COMMUNITY): Payer: Commercial Managed Care - PPO

## 2015-05-06 ENCOUNTER — Ambulatory Visit (HOSPITAL_COMMUNITY): Payer: Commercial Managed Care - PPO | Attending: Family Medicine

## 2015-05-06 DIAGNOSIS — I739 Peripheral vascular disease, unspecified: Secondary | ICD-10-CM | POA: Insufficient documentation

## 2015-05-06 DIAGNOSIS — I10 Essential (primary) hypertension: Secondary | ICD-10-CM | POA: Insufficient documentation

## 2015-05-30 ENCOUNTER — Ambulatory Visit (INDEPENDENT_AMBULATORY_CARE_PROVIDER_SITE_OTHER): Payer: Commercial Managed Care - PPO | Admitting: Emergency Medicine

## 2015-05-30 VITALS — BP 122/74 | HR 86 | Temp 98.6°F | Resp 17 | Ht 65.0 in | Wt 229.0 lb

## 2015-05-30 DIAGNOSIS — R110 Nausea: Secondary | ICD-10-CM

## 2015-05-30 DIAGNOSIS — F1010 Alcohol abuse, uncomplicated: Secondary | ICD-10-CM

## 2015-05-30 DIAGNOSIS — K289 Gastrojejunal ulcer, unspecified as acute or chronic, without hemorrhage or perforation: Secondary | ICD-10-CM

## 2015-05-30 DIAGNOSIS — M7989 Other specified soft tissue disorders: Secondary | ICD-10-CM | POA: Diagnosis not present

## 2015-05-30 DIAGNOSIS — E871 Hypo-osmolality and hyponatremia: Secondary | ICD-10-CM | POA: Diagnosis not present

## 2015-05-30 DIAGNOSIS — K920 Hematemesis: Secondary | ICD-10-CM

## 2015-05-30 LAB — POCT CBC
Granulocyte percent: 53.3 %G (ref 37–80)
HCT, POC: 33 % — AB (ref 37.7–47.9)
Hemoglobin: 10.6 g/dL — AB (ref 12.2–16.2)
Lymph, poc: 2.1 (ref 0.6–3.4)
MCH, POC: 32.8 pg — AB (ref 27–31.2)
MCHC: 32.2 g/dL (ref 31.8–35.4)
MCV: 101.9 fL — AB (ref 80–97)
MID (cbc): 0.6 (ref 0–0.9)
MPV: 7.5 fL (ref 0–99.8)
POC Granulocyte: 3.1 (ref 2–6.9)
POC LYMPH PERCENT: 36.6 %L (ref 10–50)
POC MID %: 10.1 %M (ref 0–12)
Platelet Count, POC: 313 10*3/uL (ref 142–424)
RBC: 3.24 M/uL — AB (ref 4.04–5.48)
RDW, POC: 14.9 %
WBC: 5.8 10*3/uL (ref 4.6–10.2)

## 2015-05-30 LAB — COMPLETE METABOLIC PANEL WITH GFR
ALT: 20 U/L (ref 0–35)
AST: 19 U/L (ref 0–37)
Albumin: 3.7 g/dL (ref 3.5–5.2)
Alkaline Phosphatase: 75 U/L (ref 39–117)
BUN: 14 mg/dL (ref 6–23)
CO2: 29 mEq/L (ref 19–32)
Calcium: 8.8 mg/dL (ref 8.4–10.5)
Chloride: 101 mEq/L (ref 96–112)
Creat: 0.91 mg/dL (ref 0.50–1.10)
GFR, Est African American: 75 mL/min
GFR, Est Non African American: 65 mL/min
Glucose, Bld: 105 mg/dL — ABNORMAL HIGH (ref 70–99)
Potassium: 4.4 mEq/L (ref 3.5–5.3)
Sodium: 141 mEq/L (ref 135–145)
Total Bilirubin: 0.4 mg/dL (ref 0.2–1.2)
Total Protein: 6.2 g/dL (ref 6.0–8.3)

## 2015-05-30 MED ORDER — LORAZEPAM 1 MG PO TABS
1.0000 mg | ORAL_TABLET | Freq: Two times a day (BID) | ORAL | Status: DC | PRN
Start: 2015-05-30 — End: 2015-10-04

## 2015-05-30 MED ORDER — PANTOPRAZOLE SODIUM 40 MG PO TBEC: 40.0000 mg | DELAYED_RELEASE_TABLET | Freq: Two times a day (BID) | ORAL | Status: DC

## 2015-05-30 NOTE — Progress Notes (Addendum)
Subjective:  This chart was scribed for Lesle Chris, MD by Andrew Au, ED Scribe. This patient was seen in room  11 and the patient's care was started at 8:35 AM .  Patient ID: [name], female    DOB: 1945-12-13, 69 y.o.   MRN: [id number]  HPI   Chief Complaint  Patient presents with  . Follow-up    GI bleed    HPI Comments: [name] is a [name] with who presents to the [facility] complaining of  Follow up. Pt presented here 1 month ago for hematemesis and black stools and was admitted to the hospital shortly after being diagnosed with an ulcer which was treated with an epi injection, hemoclip, and cautery. Since then, she states she's had no signs of GI bleeding. She reports swelling in lower extremities. She feels much better but still has bad days. 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 Pt has been drinking to cope with stress. She states she stopped drinking last week but did have a glass a wine with dinner within the last week. She does not want to attend [facility] and will try to stop drinking on her own. She does not have a PCP.   Past Medical History  Diagnosis Date  . Hypertension   . Arthritis   . Degenerative disc disease, cervical   . Hypothyroidism   . Allergy   . Cataract   . 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 Substance abuse     Alcohol abuse 2016   Past Surgical History  Procedure Laterality Date  . Gastric bypass    . Diagnostic laparoscopy    . Open reduction internal fixation (orif) distal radial fracture Right 01/15/2013    Procedure: OPEN REDUCTION INTERNAL FIXATION (ORIF) Right DISTAL RADIUS FRACTURE;  Surgeon: Eldred Manges, MD;  Location: [facility] OR;  Service: Orthopedics;  Laterality: Right;  . Cholecystectomy    . Cosmetic surgery    . Fracture surgery    . Esophagogastroduodenoscopy N/A 04/25/2015    Procedure:  ESOPHAGOGASTRODUODENOSCOPY (EGD);  Surgeon: Beverley Fiedler, MD;  Location: [facility] ENDOSCOPY;  Service: Endoscopy;  Laterality: N/A;   Prior to Admission medications   Medication Sig Start Date End Date Taking? Authorizing Provider  Cholecalciferol (VITAMIN D PO) Take 1 tablet by mouth daily as needed (supplement).    Yes Historical Provider, MD  diphenhydrAMINE (SOMINEX) 25 MG tablet Take 25 mg by mouth at bedtime as needed for sleep.   Yes Historical Provider, MD  estrogens, conjugated, (PREMARIN) 1.25 MG tablet Take 1.25 mg by mouth daily.   Yes Historical Provider, MD  furosemide (LASIX) 40 MG tablet Take 1 tablet (40 mg total) by mouth daily. Patient taking differently: Take 40 mg by mouth every other day.  02/11/15  Yes [name], PA-C  levothyroxine (SYNTHROID, LEVOTHROID) 112 MCG tablet Take 1 tablet (112 mcg total) by mouth daily. 02/15/15  Yes Thao P Le, DO  LORazepam (ATIVAN) 1 MG tablet Take 1 tablet (1 mg total) by mouth every 12 (twelve) hours as needed for anxiety or sleep. 04/27/15  Yes Marinda Elk, MD  Melatonin 5 MG CAPS Take 5 mg by mouth at bedtime.   Yes Historical Provider, MD  Multiple Vitamin (MULTIVITAMIN) tablet Take 1 tablet by mouth daily.   Yes Historical Provider, MD  ondansetron (  ZOFRAN) 4 MG tablet Take 4 mg by mouth every 8 (eight) hours as needed for nausea or vomiting.   Yes Historical Provider, MD  pantoprazole (PROTONIX) 40 MG tablet Take 1 tablet (40 mg total) by mouth 2 (two) times daily. 04/27/15  Yes Marinda ElkAbraham Feliz Ortiz, MD  potassium chloride SA (K-DUR,KLOR-CON) 20 MEQ tablet Take 1 tablet (20 mEq total) by mouth daily. 02/11/15  Yes Wallis BambergMario Mani, PA-C  dextrose 5 % SOLN 50 mL with magnesium sulfate 50 % SOLN 1 g Inject 1 g into the vein daily as needed (pain).    Historical Provider, MD   Review of Systems  Cardiovascular: Positive for leg swelling.   Objective:   Physical Exam  CONSTITUTIONAL: Well developed/well nourished. Alert and cooperative HEAD:  Normocephalic/atraumatic EYES: EOMI/PERRL ENMT: Mucous membranes moist NECK: supple no meningeal signs SPINE/BACK:entire spine nontender CV: S1/S2 noted, no murmurs/rubs/gallops noted LUNGS: Lungs are clear to auscultation bilaterally, no apparent distress ABDOMEN: soft, nontender, no rebound or guarding, bowel sounds noted throughout abdomen.  GU:no cva tenderness NEURO: Pt is awake/alert/appropriate, moves all extremitiesx4.  No facial droop.   EXTREMITIES: pulses normal/equal, full ROM. 2+ swelling of lower third of both legs extending down to feet.  SKIN: warm, color normal.  PSYCH: no abnormalities of mood noted, alert and oriented to situation  Filed Vitals:   05/30/15 0815  BP: 122/74  Pulse: 86  Temp: 98.6 F (37 C)  TempSrc: Oral  Resp: 17  Height: 5\' 5"  (1.651 m)  Weight: 229 lb (103.874 kg)  SpO2: 98%   Results for orders placed or performed in visit on 05/30/15  POCT CBC  Result Value Ref Range   WBC 5.8 4.6 - 10.2 K/uL   Lymph, poc 2.1 0.6 - 3.4   POC LYMPH PERCENT 36.6 10 - 50 %L   MID (cbc) 0.6 0 - 0.9   POC MID % 10.1 0 - 12 %M   POC Granulocyte 3.1 2 - 6.9   Granulocyte percent 53.3 37 - 80 %G   RBC 3.24 (A) 4.04 - 5.48 M/uL   Hemoglobin 10.6 (A) 12.2 - 16.2 g/dL   HCT, POC 16.133.0 (A) 09.637.7 - 47.9 %   MCV 101.9 (A) 80 - 97 fL   MCH, POC 32.8 (A) 27 - 31.2 pg   MCHC 32.2 31.8 - 35.4 g/dL   RDW, POC 04.514.9 %   Platelet Count, POC 313 142 - 424 K/uL   MPV 7.5 0 - 99.8 fL   Assessment & Plan:  1. Anastomotic ulcer S/P gastric bypass She will continue Protonix - POCT CBC - COMPLETE METABOLIC PANEL WITH GFR  2. Hematemesis with nausea Hemoglobin is up 1 g she has been stable without recurrent hematemesis. - POCT CBC - COMPLETE METABOLIC PANEL WITH GFR  3. Alcohol abuse I feel this is a huge issue. She not currently willing to go to AA or get professional help. She feels she can handle this herself. She would not let me refer her to the [facility] or to a  counselor.  4. Swelling of lower extremity I suspect this is secondary to a combination of low albumen and low hemoglobin. She will continue to wear TEDs hose  5. Hyponatremia Repeat Cmet done today   I personally performed the services described in this documentation, which was scribed in my presence. The recorded information has been reviewed and is accurate.  Lesle ChrisSteven Halea Lieb, MD  [facility], [facility]  05/30/2015 10:10 AM

## 2015-10-04 ENCOUNTER — Ambulatory Visit (INDEPENDENT_AMBULATORY_CARE_PROVIDER_SITE_OTHER): Payer: Medicare Other | Admitting: Emergency Medicine

## 2015-10-04 VITALS — BP 118/80 | HR 83 | Temp 98.2°F | Resp 18 | Ht 65.5 in | Wt 231.0 lb

## 2015-10-04 DIAGNOSIS — R309 Painful micturition, unspecified: Secondary | ICD-10-CM | POA: Diagnosis not present

## 2015-10-04 DIAGNOSIS — R300 Dysuria: Secondary | ICD-10-CM | POA: Diagnosis not present

## 2015-10-04 LAB — POCT URINALYSIS DIP (MANUAL ENTRY)
Bilirubin, UA: NEGATIVE
Glucose, UA: NEGATIVE
Ketones, POC UA: NEGATIVE
Leukocytes, UA: NEGATIVE
Nitrite, UA: NEGATIVE
Protein Ur, POC: NEGATIVE
Spec Grav, UA: 1.015
Urobilinogen, UA: 0.2
pH, UA: 6

## 2015-10-04 LAB — POC MICROSCOPIC URINALYSIS (UMFC): Mucus: ABSENT

## 2015-10-04 MED ORDER — LORAZEPAM 1 MG PO TABS: 1.0000 mg | ORAL_TABLET | Freq: Two times a day (BID) | ORAL | Status: DC | PRN

## 2015-10-04 MED ORDER — BENAZEPRIL-HYDROCHLOROTHIAZIDE 20-25 MG PO TABS: 1.0000 | ORAL_TABLET | Freq: Every day | ORAL | Status: DC

## 2015-10-04 NOTE — Patient Instructions (Signed)
Hypertension Hypertension, commonly called high blood pressure, is when the force of blood pumping through your arteries is too strong. Your arteries are the blood vessels that carry blood from your heart throughout your body. A blood pressure reading consists of a higher number over a lower number, such as 110/72. The higher number (systolic) is the pressure inside your arteries when your heart pumps. The lower number (diastolic) is the pressure inside your arteries when your heart relaxes. Ideally you want your blood pressure below 120/80. Hypertension forces your heart to work harder to pump blood. Your arteries may become narrow or stiff. Having untreated or uncontrolled hypertension can cause heart attack, stroke, kidney disease, and other problems. RISK FACTORS Some risk factors for high blood pressure are controllable. Others are not.  Risk factors you cannot control include:   Race. You may be at higher risk if you are African American.  Age. Risk increases with age.  Gender. Men are at higher risk than women before age 45 years. After age 65, women are at higher risk than men. Risk factors you can control include:  Not getting enough exercise or physical activity.  Being overweight.  Getting too much fat, sugar, calories, or salt in your diet.  Drinking too much alcohol. SIGNS AND SYMPTOMS Hypertension does not usually cause signs or symptoms. Extremely high blood pressure (hypertensive crisis) may cause headache, anxiety, shortness of breath, and nosebleed. DIAGNOSIS To check if you have hypertension, your health care provider will measure your blood pressure while you are seated, with your arm held at the level of your heart. It should be measured at least twice using the same arm. Certain conditions can cause a difference in blood pressure between your right and left arms. A blood pressure reading that is higher than normal on one occasion does not mean that you need treatment. If  it is not clear whether you have high blood pressure, you may be asked to return on a different day to have your blood pressure checked again. Or, you may be asked to monitor your blood pressure at home for 1 or more weeks. TREATMENT Treating high blood pressure includes making lifestyle changes and possibly taking medicine. Living a healthy lifestyle can help lower high blood pressure. You may need to change some of your habits. Lifestyle changes may include:  Following the DASH diet. This diet is high in fruits, vegetables, and whole grains. It is low in salt, red meat, and added sugars.  Keep your sodium intake below 2,300 mg per day.  Getting at least 30-45 minutes of aerobic exercise at least 4 times per week.  Losing weight if necessary.  Not smoking.  Limiting alcoholic beverages.  Learning ways to reduce stress. Your health care provider may prescribe medicine if lifestyle changes are not enough to get your blood pressure under control, and if one of the following is true:  You are 18-59 years of age and your systolic blood pressure is above 140.  You are 60 years of age or older, and your systolic blood pressure is above 150.  Your diastolic blood pressure is above 90.  You have diabetes, and your systolic blood pressure is over 140 or your diastolic blood pressure is over 90.  You have kidney disease and your blood pressure is above 140/90.  You have heart disease and your blood pressure is above 140/90. Your personal target blood pressure may vary depending on your medical conditions, your age, and other factors. HOME CARE INSTRUCTIONS    Have your blood pressure rechecked as directed by your health care provider.   Take medicines only as directed by your health care provider. Follow the directions carefully. Blood pressure medicines must be taken as prescribed. The medicine does not work as well when you skip doses. Skipping doses also puts you at risk for  problems.  Do not smoke.   Monitor your blood pressure at home as directed by your health care provider. SEEK MEDICAL CARE IF:   You think you are having a reaction to medicines taken.  You have recurrent headaches or feel dizzy.  You have swelling in your ankles.  You have trouble with your vision. SEEK IMMEDIATE MEDICAL CARE IF:  You develop a severe headache or confusion.  You have unusual weakness, numbness, or feel faint.  You have severe chest or abdominal pain.  You vomit repeatedly.  You have trouble breathing. MAKE SURE YOU:   Understand these instructions.  Will watch your condition.  Will get help right away if you are not doing well or get worse.   This information is not intended to replace advice given to you by your health care provider. Make sure you discuss any questions you have with your health care provider.   Document Released: 11/13/2005 Document Revised: 03/30/2015 Document Reviewed: 09/05/2013 Elsevier Interactive Patient Education 2016 [employer]  

## 2015-10-04 NOTE — Progress Notes (Signed)
Subjective:  Patient ID: [name], female    DOB: 08/11/1946  Age: 69 y.o. MRN: [id number]  CC: Urinary Tract Infection; Dysuria; Medication Refill; and Flu Vaccine   HPI [name] presents  with dysuria. She has no urgency or frequency. She has a history of prior urinary tract infections. She has no back pain. No nausea vomiting. No abdominal pain. She has no vaginal discharge or bleeding. She has no dyspareunia. She's getting ready to go on a trip to [facility] and wants a clean bill of health before she goes on her trip. She denies any improvement with over-the-counter medication.  History [name] has a past medical history of Hypertension; Arthritis; Degenerative disc disease, cervical; Hypothyroidism; Allergy; Cataract; Kidney damage; and Substance abuse.   She has past surgical history that includes Gastric bypass; Diagnostic laparoscopy; Open reduction internal fixation (orif) distal radial fracture (Right, 01/15/2013); Cholecystectomy; Cosmetic surgery; Fracture surgery; and Esophagogastroduodenoscopy (N/A, 04/25/2015).   Her  family history includes COPD in her father; Cancer in her father, mother, and paternal grandmother; Diabetes in her father; Heart disease in her mother; Hypertension in her father; Mental illness in her maternal grandmother; Stroke in her maternal grandmother and mother.  She   reports that she has never smoked. She does not have any smokeless tobacco history on file. She reports that she drinks about 16.8 oz of alcohol per week. She reports that she does not use illicit drugs.  Outpatient Prescriptions Prior to Visit  Medication Sig Dispense Refill  . Cholecalciferol (VITAMIN D PO) Take 1 tablet by mouth daily as needed (supplement).     . diphenhydrAMINE (SOMINEX) 25 MG tablet Take 25 mg by mouth at bedtime as needed for sleep.    Marland Kitchen estrogens, conjugated, (PREMARIN) 1.25 MG tablet Take 1.25 mg by mouth daily.    . furosemide (LASIX) 40 MG  tablet Take 1 tablet (40 mg total) by mouth daily. (Patient taking differently: Take 40 mg by mouth every other day. ) 30 tablet 3  . levothyroxine (SYNTHROID, LEVOTHROID) 112 MCG tablet Take 1 tablet (112 mcg total) by mouth daily. 90 tablet 1  . Melatonin 5 MG CAPS Take 5 mg by mouth at bedtime.    . Multiple Vitamin (MULTIVITAMIN) tablet Take 1 tablet by mouth daily.    . ondansetron (ZOFRAN) 4 MG tablet Take 4 mg by mouth every 8 (eight) hours as needed for nausea or vomiting.    . pantoprazole (PROTONIX) 40 MG tablet Take 1 tablet (40 mg total) by mouth 2 (two) times daily. 60 tablet 5  . LORazepam (ATIVAN) 1 MG tablet Take 1 tablet (1 mg total) by mouth every 12 (twelve) hours as needed for anxiety or sleep. 30 tablet 0  . dextrose 5 % SOLN 50 mL with magnesium sulfate 50 % SOLN 1 g Inject 1 g into the vein daily as needed (pain).    . potassium chloride SA (K-DUR,KLOR-CON) 20 MEQ tablet Take 1 tablet (20 mEq total) by mouth daily. (Patient not taking: Reported on 10/04/2015) 30 tablet 3   No facility-administered medications prior to visit.    Social History   Social History  . Marital Status: Married    Spouse Name: N/A  . Number of Children: N/A  . Years of Education: N/A   Occupational History  . RN     ICU RN [employer]   Social History Main Topics  . Smoking status: Never Smoker   . Smokeless tobacco: None  .  Alcohol Use: 16.8 oz/week    28 Glasses of wine per week     Comment: drinks daily and has a bottle of wine per day  . Drug Use: No  . Sexual Activity: Yes    Birth Control/ Protection: Post-menopausal   Other Topics Concern  . 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      Alcohol: drinks bottle of wine daily in 2016     Review of Systems  Constitutional: Negative for fever, chills and appetite change.  HENT: Negative for congestion, ear pain, postnasal drip, sinus pressure and sore  throat.   Eyes: Negative for pain and redness.  Respiratory: Negative for cough, shortness of breath and wheezing.   Cardiovascular: Negative for leg swelling.  Gastrointestinal: Negative for nausea, vomiting, abdominal pain, diarrhea, constipation and blood in stool.  Endocrine: Negative for polyuria.  Genitourinary: Positive for urgency, frequency and dyspareunia. Negative for dysuria and flank pain.  Musculoskeletal: Negative for gait problem.  Skin: Negative for rash.  Neurological: Negative for weakness and headaches.  Psychiatric/Behavioral: Negative for confusion and decreased concentration. The patient is not nervous/anxious.     Objective:  BP 118/80 mmHg  Pulse 83  Temp(Src) 98.2 F (36.8 C) (Oral)  Resp 18  Ht 5' 5.5" (1.664 m)  Wt 231 lb (104.781 kg)  BMI 37.84 kg/m2  SpO2 98%  Physical Exam  Constitutional: She is oriented to person, place, and time. She appears well-developed and well-nourished.  HENT:  Head: Normocephalic and atraumatic.  Eyes: Conjunctivae are normal. Pupils are equal, round, and reactive to light.  Pulmonary/Chest: Effort normal.  Musculoskeletal: She exhibits no edema.  Neurological: She is alert and oriented to person, place, and time.  Skin: Skin is dry.  Psychiatric: She has a normal mood and affect. Her behavior is normal. Thought content normal.      Assessment & Plan:   [name] was seen today for urinary tract infection, dysuria, medication refill and flu vaccine.  Diagnoses and all orders for this visit:  Pain with urination -     POCT Microscopic Urinalysis ([facility]) -     POCT urinalysis dipstick -     Urine culture  Other orders -     benazepril-hydrochlorthiazide (LOTENSIN HCT) 20-25 MG tablet; Take 1 tablet by mouth daily. -     LORazepam (ATIVAN) 1 MG tablet; Take 1 tablet (1 mg total) by mouth every 12 (twelve) hours as needed for anxiety or sleep.   I am having [name] maintain her estrogens (conjugated),  Cholecalciferol (VITAMIN D PO), multivitamin, furosemide, potassium chloride SA, levothyroxine, ondansetron, dextrose 5 % SOLN 50 mL with magnesium sulfate 50 % SOLN 1 g, diphenhydrAMINE, Melatonin, pantoprazole, magnesium (amino acid chelate), ferrous sulfate, ibuprofen, acetaminophen, benazepril-hydrochlorthiazide, and LORazepam.  Meds ordered this encounter  Medications  . Specialty Vitamins Products (MAGNESIUM, AMINO ACID CHELATE,) 133 MG tablet    Sig: Take 1 tablet by mouth 2 (two) times daily.  . ferrous sulfate 325 (65 FE) MG tablet    Sig: Take 325 mg by mouth daily with breakfast.  . ibuprofen (ADVIL) 200 MG tablet    Sig: Take 200 mg by mouth every 6 (six) hours as needed.  Marland Kitchen acetaminophen (TYLENOL) 325 MG tablet    Sig: Take 650 mg by mouth every 6 (six) hours as needed.  Marland Kitchen DISCONTD: benazepril-hydrochlorthiazide (LOTENSIN HCT) 20-25 MG tablet    Sig: Take  1 tablet by mouth daily.  . benazepril-hydrochlorthiazide (LOTENSIN HCT) 20-25 MG tablet    Sig: Take 1 tablet by mouth daily.    Dispense:  90 tablet    Refill:  3  . LORazepam (ATIVAN) 1 MG tablet    Sig: Take 1 tablet (1 mg total) by mouth every 12 (twelve) hours as needed for anxiety or sleep.    Dispense:  60 tablet    Refill:  0    Appropriate red flag conditions were discussed with the patient as well as actions that should be taken.  Patient expressed his understanding.  Follow-up: Return if symptoms worsen or fail to improve.  Carmelina DaneAnderson, Lore Polka S, MD   Results for orders placed or performed in visit on 10/04/15  POCT Microscopic Urinalysis ([facility])  Result Value Ref Range   WBC,UR,HPF,POC None None WBC/hpf   RBC,UR,HPF,POC Few (A) None RBC/hpf   Bacteria Few (A) None, Too numerous to count   Mucus Absent Absent   Epithelial Cells, UR Per Microscopy Few (A) None, Too numerous to count cells/hpf  POCT urinalysis dipstick  Result Value Ref Range   Color, UA yellow yellow   Clarity, UA clear clear    Glucose, UA negative negative   Bilirubin, UA negative negative   Ketones, POC UA negative negative   Spec Grav, UA 1.015    Blood, UA small (A) negative   pH, UA 6.0    Protein Ur, POC negative negative   Urobilinogen, UA 0.2    Nitrite, UA Negative Negative   Leukocytes, UA Negative Negative

## 2015-10-06 LAB — URINE CULTURE: Colony Count: 10000

## 2015-10-22 ENCOUNTER — Encounter: Payer: Self-pay | Admitting: *Deleted

## 2016-02-04 ENCOUNTER — Other Ambulatory Visit: Payer: Self-pay | Admitting: Urgent Care

## 2016-02-13 ENCOUNTER — Ambulatory Visit (INDEPENDENT_AMBULATORY_CARE_PROVIDER_SITE_OTHER): Payer: Medicare Other | Admitting: Physician Assistant

## 2016-02-13 VITALS — BP 130/70 | HR 83 | Temp 97.8°F | Resp 14 | Ht 66.0 in | Wt 244.0 lb

## 2016-02-13 DIAGNOSIS — E038 Other specified hypothyroidism: Secondary | ICD-10-CM

## 2016-02-13 DIAGNOSIS — E039 Hypothyroidism, unspecified: Secondary | ICD-10-CM | POA: Diagnosis not present

## 2016-02-13 DIAGNOSIS — F439 Reaction to severe stress, unspecified: Secondary | ICD-10-CM

## 2016-02-13 DIAGNOSIS — I10 Essential (primary) hypertension: Secondary | ICD-10-CM

## 2016-02-13 DIAGNOSIS — E034 Atrophy of thyroid (acquired): Secondary | ICD-10-CM | POA: Diagnosis not present

## 2016-02-13 DIAGNOSIS — R600 Localized edema: Secondary | ICD-10-CM | POA: Diagnosis not present

## 2016-02-13 DIAGNOSIS — M62838 Other muscle spasm: Secondary | ICD-10-CM | POA: Diagnosis not present

## 2016-02-13 DIAGNOSIS — D62 Acute posthemorrhagic anemia: Secondary | ICD-10-CM

## 2016-02-13 DIAGNOSIS — K922 Gastrointestinal hemorrhage, unspecified: Secondary | ICD-10-CM | POA: Diagnosis not present

## 2016-02-13 DIAGNOSIS — Z638 Other specified problems related to primary support group: Secondary | ICD-10-CM | POA: Diagnosis not present

## 2016-02-13 LAB — CBC WITH DIFFERENTIAL/PLATELET
Basophils Absolute: 0.1 10*3/uL (ref 0.0–0.1)
Basophils Relative: 2 % — ABNORMAL HIGH (ref 0–1)
Eosinophils Absolute: 0.1 10*3/uL (ref 0.0–0.7)
Eosinophils Relative: 2 % (ref 0–5)
HCT: 43.2 % (ref 36.0–46.0)
Hemoglobin: 14.5 g/dL (ref 12.0–15.0)
Lymphocytes Relative: 32 % (ref 12–46)
Lymphs Abs: 2.3 10*3/uL (ref 0.7–4.0)
MCH: 36.2 pg — ABNORMAL HIGH (ref 26.0–34.0)
MCHC: 33.6 g/dL (ref 30.0–36.0)
MCV: 107.7 fL — ABNORMAL HIGH (ref 78.0–100.0)
MPV: 9.8 fL (ref 8.6–12.4)
Monocytes Absolute: 0.6 10*3/uL (ref 0.1–1.0)
Monocytes Relative: 9 % (ref 3–12)
Neutro Abs: 4 10*3/uL (ref 1.7–7.7)
Neutrophils Relative %: 55 % (ref 43–77)
Platelets: 296 10*3/uL (ref 150–400)
RBC: 4.01 MIL/uL (ref 3.87–5.11)
RDW: 13.9 % (ref 11.5–15.5)
WBC: 7.2 10*3/uL (ref 4.0–10.5)

## 2016-02-13 LAB — COMPLETE METABOLIC PANEL WITH GFR
ALT: 43 U/L — ABNORMAL HIGH (ref 6–29)
AST: 51 U/L — ABNORMAL HIGH (ref 10–35)
Albumin: 4 g/dL (ref 3.6–5.1)
Alkaline Phosphatase: 102 U/L (ref 33–130)
BUN: 17 mg/dL (ref 7–25)
CO2: 28 mmol/L (ref 20–31)
Calcium: 9.3 mg/dL (ref 8.6–10.4)
Chloride: 102 mmol/L (ref 98–110)
Creat: 0.98 mg/dL (ref 0.50–0.99)
GFR, Est African American: 68 mL/min (ref >=60)
GFR, Est Non African American: 59 mL/min — ABNORMAL LOW (ref >=60)
Glucose, Bld: 111 mg/dL — ABNORMAL HIGH (ref 65–99)
Potassium: 4.7 mmol/L (ref 3.5–5.3)
Sodium: 139 mmol/L (ref 135–146)
Total Bilirubin: 0.6 mg/dL (ref 0.2–1.2)
Total Protein: 6.7 g/dL (ref 6.1–8.1)

## 2016-02-13 LAB — TSH: TSH: 4.66 mIU/L — ABNORMAL HIGH

## 2016-02-13 MED ORDER — FUROSEMIDE 40 MG PO TABS: 40.0000 mg | ORAL_TABLET | Freq: Every day | ORAL | Status: DC

## 2016-02-13 MED ORDER — BENAZEPRIL-HYDROCHLOROTHIAZIDE 20-25 MG PO TABS: 1.0000 | ORAL_TABLET | Freq: Every day | ORAL | Status: DC

## 2016-02-13 MED ORDER — LORAZEPAM 1 MG PO TABS: 1.0000 mg | ORAL_TABLET | Freq: Two times a day (BID) | ORAL | Status: DC | PRN

## 2016-02-13 MED ORDER — DICLOFENAC SODIUM 75 MG PO TBEC: 75.0000 mg | DELAYED_RELEASE_TABLET | Freq: Two times a day (BID) | ORAL | Status: DC | PRN

## 2016-02-13 MED ORDER — PANTOPRAZOLE SODIUM 40 MG PO TBEC: 40.0000 mg | DELAYED_RELEASE_TABLET | Freq: Two times a day (BID) | ORAL | Status: DC

## 2016-02-13 MED ORDER — CYCLOBENZAPRINE HCL 5 MG PO TABS: 5.0000 mg | ORAL_TABLET | Freq: Three times a day (TID) | ORAL | Status: DC | PRN

## 2016-02-13 NOTE — Patient Instructions (Signed)
     IF you received an x-ray today, you will receive an invoice from [facility]. Please contact [facility] at [phone] with questions or concerns regarding your invoice.   IF you received labwork today, you will receive an invoice from [facility]. Please contact [facility] at [phone] with questions or concerns regarding your invoice.   Our billing staff will not be able to assist you with questions regarding bills from these companies.  You will be contacted with the lab results as soon as they are available. The fastest way to get your results is to activate your My Chart account. Instructions are located on the last page of this paperwork. If you have not heard from us regarding the results in 2 weeks, please contact this office.      

## 2016-02-13 NOTE — Progress Notes (Signed)
[name]  MRN: [id number] DOB: 1946-01-28  Subjective:  Pt presents to clinic for medication refills.  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 and within the last 6 weeks her mom's health has significantly declined - she does not sleep well at night and will take an ativan to help her - most of it is multiple wakenings in the night to take care of her mom - she feels like mentally she is dealing ok with her situation - she has thought many times of nursing facilities but the cost makes it not possible - she and her husband had a trip planned at the end of the month and she is looking forward to the rest and relaxation she will get to have - her feet are swelling more - she takes lasix bid - she does not exercise and she has gained weight - she has compression socks at home but she does not wear them - she is unable to take the KCl pills because they are so big - she uses coconut water and diet for her potassium supplementation - she has not been to vascular to determine if varicosities are the cause of her swelling  - she has been having more back pain recently since her mom has gotten more sickly - she has been using motrin for her pain and magnesium - she would like a muscle relaxer - she has used Flexeril in the past  Daughter and husband are supportive and local -   Patient Active Problem List   Diagnosis Date Noted  . Hypothyroidism - needs lab work 02/13/2016  . 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 UGIB (upper gastrointestinal bleed) - in 2016 - takes her protonix daily - needs a refill 04/25/2015  . GI bleed 04/25/2015  . HTN (hypertension) - controlled on medications 04/25/2015  . Alcohol abuse 04/25/2015  . Anemia associated with acute blood loss -  04/25/2015  . Hyponatremia 04/25/2015  . Acute anastomotic ulcer of gastrojejunal region   . Distal  radius fracture, right 01/15/2013    Current Outpatient Prescriptions on File Prior to Visit  Medication Sig Dispense Refill  . acetaminophen (TYLENOL) 325 MG tablet Take 650 mg by mouth every 6 (six) hours as needed.    . Cholecalciferol (VITAMIN D PO) Take 1 tablet by mouth daily as needed (supplement).     . diphenhydrAMINE (SOMINEX) 25 MG tablet Take 25 mg by mouth at bedtime as needed for sleep.    Marland Kitchen estrogens, conjugated, (PREMARIN) 1.25 MG tablet Take 1.25 mg by mouth daily.    . ferrous sulfate 325 (65 FE) MG tablet Take 325 mg by mouth daily with breakfast.    . levothyroxine (SYNTHROID, LEVOTHROID) 112 MCG tablet Take 1 tablet (112 mcg total) by mouth daily. 90 tablet 1  . Melatonin 5 MG CAPS Take 5 mg by mouth at bedtime.    . Multiple Vitamin (MULTIVITAMIN) tablet Take 1 tablet by mouth daily.    . ondansetron (ZOFRAN) 4 MG tablet Take 4 mg by mouth every 8 (eight) hours as needed for nausea or vomiting.    . potassium chloride SA (K-DUR,KLOR-CON) 20 MEQ tablet Take 1 tablet (20 mEq total) by mouth daily. 30 tablet 3  . Specialty Vitamins Products (MAGNESIUM, AMINO ACID CHELATE,) 133 MG tablet Take 1 tablet  by mouth 2 (two) times daily.     No current facility-administered medications on file prior to visit.    Allergies  Allergen Reactions  . Cocoa Anaphylaxis  . Red Dye Hives    Rash and Nausea diarrhea  . Lyrica [Pregabalin] Other (See Comments)    Abnormal bleeding  . Sulfa Antibiotics Rash    Review of Systems  Constitutional: Negative for fever and chills.  Cardiovascular: Positive for leg swelling. Negative for chest pain.  Musculoskeletal: Positive for back pain. Negative for gait problem.  Psychiatric/Behavioral: Positive for sleep disturbance. Negative for dysphoric mood.   Objective:  BP 130/70 mmHg  Pulse 83  Temp(Src) 97.8 F (36.6 C) (Oral)  Resp 14  Ht  (1.676 m)  Wt 244 lb (110.678 kg)  BMI 39.40 kg/m2  SpO2 96%  Physical Exam    Constitutional: She is oriented to person, place, and time and well-developed, well-nourished, and in no distress.  HENT:  Head: Normocephalic and atraumatic.  Right Ear: Hearing and external ear normal.  Left Ear: Hearing and external ear normal.  Mouth/Throat: Abnormal dentition. Dental caries present.  Eyes: Conjunctivae are normal.  Neck: Normal range of motion.  Cardiovascular: Normal rate, regular rhythm and normal heart sounds.   No murmur heard. Pulmonary/Chest: Effort normal and breath sounds normal.  Musculoskeletal:       Right lower leg: She exhibits edema (1+ pitting edema to mid shin, dusky discoloration of feet).       Left lower leg: She exhibits edema (1+ pitting edema to mid shin, dusky discoloration of feet).  Neurological: She is alert and oriented to person, place, and time. Gait normal.  Skin: Skin is warm and dry.  Psychiatric: Mood, memory, affect and judgment normal.  Vitals reviewed.   Assessment and Plan :  Essential hypertension - Plan: COMPLETE METABOLIC PANEL WITH GFR, benazepril-hydrochlorthiazide (LOTENSIN HCT) 20-25 MG tablet - good controlled - continue at current medication dose  UGIB (upper gastrointestinal bleed) - Plan: pantoprazole (PROTONIX) 40 MG tablet - continue medications -   Hypothyroidism due to acquired atrophy of thyroid - Plan: TSH - check labs and order correct dose of synthroid on their return  Stress at home - Plan: LORazepam (ATIVAN) 1 MG tablet - pt has a good support system for her in regards to the care of her mother - it recently has gotten more challenging with the decline in her mom's health - she is excited about her upcoming vacation - she will recheck if she feels like she needs something further for her mood -   Muscle spasm - Plan: cyclobenzaprine (FLEXERIL) 5 MG tablet, diclofenac (VOLTAREN) 75 MG EC tablet - gave her muscle relaxers - I would prefer the patient take no motrin - she is unable to take celebrex and mobic -  she was given voltaren EC and she was advised to use extreme cause when using these medications with her h/o GI bleed - hopefully the muscle relaxers will help her pain to where she does not need NSAIDs  Bilateral edema of lower extremity - Plan: furosemide (LASIX) 40 MG tablet - encouraged patient to use compression socks - weight loss will be helpful - I suspect she has PVD and would benefit from vascular referral but she will f/u with this in the future  Anemia associated with acute blood loss - Plan: CBC with Differential/Platelet - check labs to make sure she has returned to normal  Benny Lennert PA-C  [facility] 02/13/2016 10:06 AM

## 2016-02-18 MED ORDER — LEVOTHYROXINE SODIUM 125 MCG PO TABS: 125.0000 ug | ORAL_TABLET | Freq: Every day | ORAL | Status: DC

## 2016-02-18 NOTE — Addendum Note (Signed)
Addended by: Morrell RiddleWEBER, Jyaire Koudelka L on: 02/18/2016 01:05 PM   Modules accepted: Orders

## 2016-06-14 ENCOUNTER — Ambulatory Visit (INDEPENDENT_AMBULATORY_CARE_PROVIDER_SITE_OTHER): Payer: Medicare Other | Admitting: Family Medicine

## 2016-06-14 ENCOUNTER — Encounter: Payer: Self-pay | Admitting: Family Medicine

## 2016-06-14 VITALS — BP 114/72 | HR 85 | Ht 66.0 in | Wt 250.0 lb

## 2016-06-14 DIAGNOSIS — E039 Hypothyroidism, unspecified: Secondary | ICD-10-CM | POA: Diagnosis not present

## 2016-06-14 DIAGNOSIS — M609 Myositis, unspecified: Secondary | ICD-10-CM

## 2016-06-14 DIAGNOSIS — F411 Generalized anxiety disorder: Secondary | ICD-10-CM | POA: Diagnosis not present

## 2016-06-14 DIAGNOSIS — IMO0001 Reserved for inherently not codable concepts without codable children: Secondary | ICD-10-CM

## 2016-06-14 DIAGNOSIS — F1010 Alcohol abuse, uncomplicated: Secondary | ICD-10-CM

## 2016-06-14 DIAGNOSIS — I10 Essential (primary) hypertension: Secondary | ICD-10-CM | POA: Diagnosis not present

## 2016-06-14 DIAGNOSIS — M791 Myalgia: Secondary | ICD-10-CM

## 2016-06-14 DIAGNOSIS — F10982 Alcohol use, unspecified with alcohol-induced sleep disorder: Secondary | ICD-10-CM

## 2016-06-14 DIAGNOSIS — M62830 Muscle spasm of back: Secondary | ICD-10-CM | POA: Insufficient documentation

## 2016-06-14 DIAGNOSIS — D509 Iron deficiency anemia, unspecified: Secondary | ICD-10-CM

## 2016-06-14 DIAGNOSIS — R600 Localized edema: Secondary | ICD-10-CM

## 2016-06-14 DIAGNOSIS — S37001D Unspecified injury of right kidney, subsequent encounter: Secondary | ICD-10-CM

## 2016-06-14 DIAGNOSIS — R609 Edema, unspecified: Secondary | ICD-10-CM

## 2016-06-14 HISTORY — DX: Generalized anxiety disorder: F41.1

## 2016-06-14 MED ORDER — LEVOTHYROXINE SODIUM 125 MCG PO TABS: 125.0000 ug | ORAL_TABLET | Freq: Every day | ORAL | Status: DC

## 2016-06-14 NOTE — Progress Notes (Signed)
[name], D.O. [facility]   Subjective:    Chief Complaint  Patient presents with  . Establish Care   New pt, here to establish care.   HPI: [name] BarriosLynda H [name] is a pleasant 70 y.o. female who presents [facility] today to become established  Never really had a full-time primary care doc always went to the urgent care just as neededBecause her prior primary care physician when into concierge medicine at [employer] [facility]. Andi DevonKimberly Shelton.   Pt is a retired [employer], she retired last fall.    Concerns about lower extremity edema which she has had persistently for 10 years or so. It becomes especially prominent after she eats salty meals. She was put on Lasix 2-3 years ago for lower extremity edema.   Patient drinks one or 2 glasses of water a day and 1-2 or more bottles of wine per day and says that her lower extremity edema has gotten much worse over the past 6-4253mo.  ( since she has retired and is sitting around more, more than likely).   She does mostly sitting around during the day, no exercise. Her hobbies include reading and watching TV. - bilateral lower extremity Dopplers in 2016 - normal.  She has right lower extremity arthritis of her knee which comes and goes. Would not like this addressed today.  Recently her mom died in April 2017 at age [id number].  patient spent a lot of time taking care of her as well.  She lives at home with her husband of 35 years who is very supportive.  He drinks one glass of wine a night.  He worries about pt.       Past Medical History:  Diagnosis Date  . Allergy   . Arthritis   . Cataract   . Degenerative disc disease, cervical   . HTN (hypertension) 04/25/2015  . Hypertension   . Hypothyroidism   . 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 Substance abuse    Alcohol abuse 2016      Past Surgical History:  Procedure Laterality Date  .  CHOLECYSTECTOMY    . COSMETIC SURGERY    . DIAGNOSTIC LAPAROSCOPY    . ESOPHAGOGASTRODUODENOSCOPY N/A 04/25/2015   Procedure: ESOPHAGOGASTRODUODENOSCOPY (EGD);  Surgeon: Beverley FiedlerJay M Pyrtle, MD;  [facility];  Service: Endoscopy;  Laterality: N/A;  . FRACTURE SURGERY    . GASTRIC BYPASS    . OPEN REDUCTION INTERNAL FIXATION (ORIF) DISTAL RADIAL FRACTURE Right 01/15/2013   Procedure: OPEN REDUCTION INTERNAL FIXATION (ORIF) Right DISTAL RADIUS FRACTURE;  Surgeon: Eldred MangesMark C Yates, MD;  Location: [facility] OR;  Service: Orthopedics;  Laterality: Right;      Family History  Problem Relation Age of Onset  . Heart disease Mother   . Stroke Mother   . Cancer Mother     skin and breast  . Hypertension Mother   . COPD Father   . Cancer Father   . Diabetes Father   . Hypertension Father   . Stroke Maternal Grandmother   . Mental illness Maternal Grandmother   . Cancer Paternal Grandmother       History  Drug Use No  ,    History  Alcohol Use  . 16.8 oz/week  . 28 Glasses of wine per week    Comment: drinks daily and has a bottle of wine per day  ,  History  Smoking Status  . Never Smoker  Smokeless Tobacco  . Never Used  ,     History  Sexual Activity  . Sexual activity: Yes  . Birth control/ protection: Post-menopausal      Patient's Medications  New Prescriptions   No medications on file  Previous Medications   ACETAMINOPHEN (TYLENOL) 325 MG TABLET    Take 650 mg by mouth every 6 (six) hours as needed.   BENAZEPRIL-HYDROCHLORTHIAZIDE (LOTENSIN HCT) 20-25 MG TABLET    Take 1 tablet by mouth daily.   CHOLECALCIFEROL (VITAMIN D PO)    Take 1 tablet by mouth daily as needed (supplement).    CYCLOBENZAPRINE (FLEXERIL) 5 MG TABLET    Take 1 tablet (5 mg total) by mouth 3 (three) times daily as needed for muscle spasms.   DICLOFENAC (VOLTAREN) 75 MG EC TABLET    Take 1 tablet (75 mg total) by mouth 2 (two) times daily as needed.   DIPHENHYDRAMINE (SOMINEX) 25 MG TABLET     Take 25 mg by mouth at bedtime as needed for sleep.   FERROUS SULFATE 325 (65 FE) MG TABLET    Take 325 mg by mouth daily with breakfast.   FUROSEMIDE (LASIX) 40 MG TABLET    Take 1 tablet (40 mg total) by mouth daily.   LORAZEPAM (ATIVAN) 1 MG TABLET    Take 1 tablet (1 mg total) by mouth every 12 (twelve) hours as needed for anxiety or sleep.   MAGNESIUM 30 MG TABLET    Take 30 mg by mouth 2 (two) times daily.   MELATONIN 5 MG CAPS    Take 5 mg by mouth at bedtime.   MULTIPLE VITAMIN (MULTIVITAMIN) TABLET    Take 1 tablet by mouth daily.   ONDANSETRON (ZOFRAN) 4 MG TABLET    Take 4 mg by mouth every 8 (eight) hours as needed for nausea or vomiting.   PANTOPRAZOLE (PROTONIX) 40 MG TABLET    Take 1 tablet (40 mg total) by mouth 2 (two) times daily.  Modified Medications   Modified Medication Previous Medication   LEVOTHYROXINE (SYNTHROID, LEVOTHROID) 125 MCG TABLET levothyroxine (SYNTHROID, LEVOTHROID) 125 MCG tablet      Take 1 tablet (125 mcg total) by mouth daily.    Take 1 tablet (125 mcg total) by mouth daily.  Discontinued Medications   ESTROGENS, CONJUGATED, (PREMARIN) 1.25 MG TABLET    Take 1.25 mg by mouth daily.   POTASSIUM CHLORIDE SA (K-DUR,KLOR-CON) 20 MEQ TABLET    Take 1 tablet (20 mEq total) by mouth daily.   SPECIALTY VITAMINS PRODUCTS (MAGNESIUM, AMINO ACID CHELATE,) 133 MG TABLET    Take 1 tablet by mouth 2 (two) times daily.     Cocoa; Red dye; Lyrica [pregabalin]; and Sulfa antibiotics   Fall Risk  02/13/2016 02/13/2016  Falls in the past year? No No     Depression screen [facility] 2/9 02/13/2016 02/13/2016 10/04/2015  Decreased Interest 0 0 0  Down, Depressed, Hopeless 0 0 0  PHQ - 2 Score 0 0 0      Review of Systems:   ( Completed via Adult Medical History Intake form today ) General:   Denies fever, chills, appetite changes, unexplained weight loss.  Optho/Auditory:   Denies visual changes, blurred vision/LOV, ringing in ears/ diff hearing Respiratory:   Denies  SOB, DOE, cough, wheezing.  Cardiovascular:   Denies chest pain, palpitations, new onset peripheral edema  Gastrointestinal:   Denies nausea, vomiting, diarrhea.  Genitourinary:  Denies dysuria, increased frequency, flank pain.  Endocrine:     Denies hot or cold intolerance, polyuria, polydipsia. Musculoskeletal:  Denies unexplained myalgias, joint swelling, arthralgias, gait problems.  Skin:  Denies rash, suspicious lesions or new/ changes in moles Neurological:    Denies dizziness, syncope, unexplained weakness, lightheadedness, numbness  Psychiatric/Behavioral:   Denies mood changes, suicidal or homicidal ideations, hallucinations    Objective:   Blood pressure 114/72, pulse 85, height 5\' 6"  (1.676 m), weight 250 lb (113.399 kg), SpO2 96 %. Body mass index is 40.35 kg/m.  General: Well Developed, well nourished, and in no acute distress.  Neuro: Alert and oriented x3, extra-ocular muscles intact, sensation grossly intact.  HEENT: Normocephalic, atraumatic, pupils equal round reactive to light, neck supple, no gross masses, no carotid bruits, no JVD apprec Skin: no gross suspicious lesions or rashes  Cardiac: distant but ess regular rate and rhythm, no murmurs rubs or gallops.  Respiratory: distant but essentially clear to auscultation bilaterally. Not using accessory muscles, speaking in full sentences.  Abdominal: Soft, not grossly distended Musculoskeletal: Ambulates w/o diff, FROM * 4 ext.  Vasc: 2+ pitting edema on R, 1+ on L, mild venous stasis dermatitis, less 2 sec cap RF, warm and pink  Psych:  No HI/SI, judgement and insight good.    Impression and Recommendations:    1. Essential hypertension Well controlled, continue meds, obtain labs  2. GAD (generalized anxiety disorder) Patient uses Ativan when necessary anxiety. Advised her I would like to changes in the future to a daily medicine and avoid use of benzos in a patient her age.  3. Alcohol abuse Advised  abstinence  4. Peripheral edema- b/l L Ext Lower extremity edema is multifactorial.  Could be from excess alcohol intake with possible hypo-albuminemia and alcoholic hepatitis, due to immobility, and likely right side worse than left due to knee osteoarthritis.  5. Morbid obesity, unspecified obesity type (HCC) We'll address this more specifically in the future as patient states she's not happy with her weight.  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 Hypothyroidism, unspecified hypothyroidism type - levothyroxine (SYNTHROID, LEVOTHROID) 125 MCG tablet; Take 1 tablet (125 mcg total) by mouth daily.  Dispense: 30 tablet; Refill: 0 - We'll obtain lab work  7. h/o Iron deficiency anemia Obtain labs in near future  8. Myalgia and myositis Obtain labs, increase activity levels, decrease alcohol intake and hydrate with water.  -One half your weight in ounces of water per day is recommended.  9. Kidney damage, right, subsequent encounter Check labs in near future  10. Insomnia due to alcohol excess and stress Continue melatonin.  Increase activity levels during the day. Hopefully in the future we can switch patient to an SSRI or the like for her mood disorder which will help with sleep most likely.  -- Told pt If you would like this addressed in the future, the knee, please make a follow-up and we can evaluate that for you.   The patient was counseled, risk factors were discussed, anticipatory guidance given.  Gross side effects, risk and benefits, and alternatives of medications discussed with patient.  Patient is aware that all medications have potential side effects and we are unable to predict every side effect or drug-drug interaction that may occur.  Expresses verbal understanding and consents to current therapy plan and treatment regimen.  Please see AVS handed out to patient at the end of our visit for further patient instructions/ counseling done pertaining  to  today's office visit.  Note: This document was prepared using Dragon voice recognition software and may include unintentional dictation errors.

## 2016-06-14 NOTE — Assessment & Plan Note (Signed)
bp stable, cont meds, will obtain labs

## 2016-06-14 NOTE — Patient Instructions (Addendum)
Lower extremity edema is multifactorial. From excess alcohol intake with possible hepatitis, due to immobility, and likely right side worse than left due to knee osteoarthritis.  If you would like this addressed in the future, the knee, please make a follow-up and we can evaluate that for you.  Try to cut back on alcohol usage as much as possible.  We will obtain blood work in the near future and then have you back to discuss that.     Peripheral Edema You have swelling in your legs (peripheral edema). This swelling is due to excess accumulation of salt and water in your body. Edema may be a sign of heart, kidney or liver disease, or a side effect of a medication. It may also be due to problems in the leg veins. Elevating your legs and using special support stockings may be very helpful, if the cause of the swelling is due to poor venous circulation. Avoid long periods of standing, whatever the cause. Treatment of edema depends on identifying the cause. Chips, pretzels, pickles and other salty foods should be avoided. Restricting salt in your diet is almost always needed. Water pills (diuretics) are often used to remove the excess salt and water from your body via urine. These medicines prevent the kidney from reabsorbing sodium. This increases urine flow. Diuretic treatment may also result in lowering of potassium levels in your body. Potassium supplements may be needed if you have to use diuretics daily. Daily weights can help you keep track of your progress in clearing your edema. You should call your caregiver for follow up care as recommended. SEEK IMMEDIATE MEDICAL CARE IF:   You have increased swelling, pain, redness, or heat in your legs.  You develop shortness of breath, especially when lying down.  You develop chest or abdominal pain, weakness, or fainting.  You have a fever.   This information is not intended to replace advice given to you by your health care provider. Make sure  you discuss any questions you have with your health care provider.   Document Released: 12/21/2004 Document Revised: 02/05/2012 Document Reviewed: 05/26/2015 Elsevier Interactive Patient Education 2016 [employer].       Alcohol Withdrawal Alcohol withdrawal is a group of symptoms that can develop when a person who drinks heavily and regularly stops drinking or drinks less. CAUSES Heavy and regular drinking can cause chemicals that send signals from the brain to the body (neurotransmitters) to deactivate. Alcohol withdrawal develops when deactivated neurotransmitters reactivate because a person stops drinking or drinks less. RISK FACTORS The more a person drinks and the longer he or she drinks, the greater the risk of alcohol withdrawal. Severe withdrawal is more likely to develop in someone who:  Had severe alcohol withdrawal in the past.  Had a seizure during a previous episode of alcohol withdrawal.  Is elderly.  Is pregnant.  Has been abusing drugs.  Has other medical problems, including:  Infection.  Heart, lung, or liver disease.  Seizures.  Mental health problems. SYMPTOMS Symptoms of this condition can be mild to moderate, or they can be severe. Mild to moderate symptoms may include:  Fatigue.  Nightmares.  Trouble sleeping.  Depression.  Anxiety.  Inability to think clearly.  Mood swings.  Irritability.  Loss of appetite.  Nausea or vomiting.  Clammy skin.  Extreme sweating.  Rapid heartbeat.  Shakiness.  Uncontrollable shaking (tremor). Severe symptoms may include:  Fever.  Seizures.  Severeconfusion.  Feeling or seeing things that are not there (hallucinations).  Symptoms usually begin within eight hours after a person stops drinking or drinks less. They can last for weeks. DIAGNOSIS Alcohol withdrawal is diagnosed with a medical history and physical exam. Sometimes, urine and blood tests are also done. TREATMENT Treatment  may involve:  Monitoring blood pressure, pulse, and breathing.  Getting fluids through an IV tube.  Medicine to reduce anxiety.  Medicine to prevent or control seizures.  Multivitamins and B vitamins.  Having a health care provider check on you daily. If symptoms are moderate to severe or if there is a risk of severe withdrawal, treatment may be done at a hospital or treatment center. HOME CARE INSTRUCTIONS  Take medicines and vitamin supplements only as directed by your health care provider.  Do not drink alcohol.  Have someone stay with you or be available if you need help.  Drink enough fluid to keep your urine clear or pale yellow.  Consider joining a 12-step program or another alcohol support group. SEEK MEDICAL CARE IF:  Your symptoms get worse or do not go away.  You cannot keep food or water in your stomach.  You are struggling with not drinking alcohol.  You cannot stop drinking alcohol. SEEK IMMEDIATE MEDICAL CARE IF:   You have an irregular heartbeat.  You have chest pain.  You have trouble breathing.  You have symptoms of severe withdrawal, such as:  A fever.  Seizures.  Severe confusion.  Hallucinations.   This information is not intended to replace advice given to you by your health care provider. Make sure you discuss any questions you have with your health care provider.   Document Released: 08/23/2005 Document Revised: 12/04/2014 Document Reviewed: 09/01/2014 Elsevier Interactive Patient Education 2016 [employer]        Alcohol Abuse and Nutrition Alcohol abuse is any pattern of alcohol consumption that harms your health, relationships, or work. Alcohol abuse can affect how your body breaks down and absorbs nutrients from food by causing your liver to work abnormally. Additionally, many people who abuse alcohol do not eat enough carbohydrates, protein, fat, vitamins, and minerals. This can cause poor nutrition (malnutrition) and a  lack of nutrients (nutrient deficiencies), which can lead to further complications. Nutrients that are commonly lacking (deficient) among people who abuse alcohol include:  Vitamins.  Vitamin A. This is stored in your liver. It is important for your vision, metabolism, and ability to fight off infections (immunity).  B vitamins. These include vitamins such as folate, thiamin, and niacin. These are important in new cell growth and maintenance.  Vitamin C. This plays an important role in iron absorption, wound healing, and immunity.  Vitamin D. This is produced by your liver, but you can also get vitamin D from food. Vitamin D is necessary for your body to absorb and use calcium.  Minerals.  Calcium. This is important for your bones and your heart and blood vessel (cardiovascular) function.  Iron. This is important for blood, muscle, and nervous system functioning.  Magnesium. This plays an important role in muscle and nerve function, and it helps to control blood sugar and blood pressure.  Zinc. This is important for the normal function of your nervous system and digestive system (gastrointestinal tract). Nutrition is an essential component of therapy for alcohol abuse. Your health care provider or dietitian will work with you to design a plan that can help restore nutrients to your body and prevent potential complications.  WHAT IS MY PLAN? Your dietitian may develop a specific diet  plan that is based on your condition and any other complications you may have. A diet plan will commonly include:  A balanced diet.  Grains: 6-8 oz per day.  Vegetables: 2-3 cups per day.  Fruits: 1-2 cups per day.  Meat and other protein: 5-6 oz per day.  Dairy: 2-3 cups per day.  Vitamin and mineral supplements.  WHAT DO I NEED TO KNOW ABOUT ALCOHOL AND NUTRITION?  Consume foods that are high in antioxidants, such as grapes, berries, nuts, green tea, and dark green and orange vegetables. This  can help to counteract some of the stress that is placed on your liver by consuming alcohol.  Avoid food and drinks that are high in fat and sugar. Foods such as sugared soft drinks, salty snack foods, and candy contain empty calories. This means that they lack important nutrients such as protein, fiber, and vitamins.  Eat frequent meals and snacks. Try to eat 5-6 small meals each day.  Eat a variety of fresh fruits and vegetables each day. This will help you get plenty of water, fiber, and vitamins in your diet.  Drink plenty of water and other clear fluids. Try to drink at least 48-64 oz (1.5-2 L) of water per day.  If you are a vegetarian, eat a variety of protein-rich foods. Pair whole grains with plant-based proteins at meals and snacks to obtain the greatest nutrient benefit from your food. For example, eat rice with beans, put peanut butter on whole-grain toast, or eat oatmeal with sunflower seeds.  Soak beans and whole grains overnight before cooking. This can help your body to absorb the nutrients more easily.  Include foods fortified with vitamins and minerals in your diet. Commonly fortified foods include milk, orange juice, cereal, and bread.  If you are malnourished, your dietitian may recommend a high-protein, high-calorie diet. This may include:  2,000-3,000 calories (kilocalories) per day.  70-100 grams of protein per day.  Your health care provider may recommend a complete nutritional supplement beverage. This can help to restore calories, protein, and vitamins to your body. Depending on your condition, you may be advised to consume this instead of or in addition to meals.  Limit your intake of caffeine. Replace drinks like coffee and black tea with decaffeinated coffee and herbal tea.  Eat a variety of foods that are high in omega fatty acids. These include fish, nuts and seeds, and soybeans. These foods may help your liver to recover and may also stabilize your  mood.  Certain medicines may cause changes in your appetite, taste, and weight. Work with your health care provider and dietitian to make any adjustments to your medicines and diet plan.  Include other healthy lifestyle choices in your daily routine.  Be physically active.  Get enough sleep.  Spend time doing activities that you enjoy.  If you are unable to take in enough food and calories by mouth, your health care provider may recommend a feeding tube. This is a tube that passes through your nose and throat, directly into your stomach. Nutritional supplement beverages can be given to you through the feeding tube to help you get the nutrients you need.  Take vitamin or mineral supplements as recommended by your health care provider. WHAT FOODS CAN I EAT? Grains Enriched pasta. Enriched rice. Fortified whole-grain bread. Fortified whole-grain cereal. Barley. Brown rice. Quinoa. Millet. Vegetables All fresh, frozen, and canned vegetables. Spinach. Kale. Artichoke. Carrots. Winter squash and pumpkin. Sweet potatoes. Broccoli. Cabbage. Cucumbers. Tomatoes. Sweet peppers.  Green beans. Peas. Corn. Fruits All fresh and frozen fruits. Berries. Grapes. Mango. Papaya. Guava. Cherries. Apples. Bananas. Peaches. Plums. Pineapple. Watermelon. Cantaloupe. Oranges. Avocado. Meats and Other Protein Sources Beef liver. Lean beef. Pork. Fresh and canned chicken. Fresh fish. Oysters. Sardines. Canned tuna. Shrimp. Eggs with yolks. Nuts and seeds. Peanut butter. Beans and lentils. Soybeans. Tofu. Dairy Whole, low-fat, and nonfat milk. Whole, low-fat, and nonfat yogurt. Cottage cheese. Sour cream. Hard and soft cheeses. Beverages Water. Herbal tea. Decaffeinated coffee. Decaffeinated green tea. 100% fruit juice. 100% vegetable juice. Instant breakfast shakes. Condiments Ketchup. Mayonnaise. Mustard. Salad dressing. Barbecue sauce. Sweets and Desserts Sugar-free ice cream. Sugar-free pudding. Sugar-free  gelatin. Fats and Oils Butter. Vegetable oil, flaxseed oil, olive oil, and walnut oil. Other Complete nutrition shakes. Protein bars. Sugar-free gum. The items listed above may not be a complete list of recommended foods or beverages. Contact your dietitian for more options. WHAT FOODS ARE NOT RECOMMENDED? Grains Sugar-sweetened breakfast cereals. Flavored instant oatmeal. Fried breads. Vegetables Breaded or deep-fried vegetables. Fruits Dried fruit with added sugar. Candied fruit. Canned fruit in syrup. Meats and Other Protein Sources Breaded or deep-fried meats. Dairy Flavored milks. Fried cheese curds or fried cheese sticks. Beverages Alcohol. Sugar-sweetened soft drinks. Sugar-sweetened tea. Caffeinated coffee and tea. Condiments Sugar. Honey. Agave nectar. Molasses. Sweets and Desserts Chocolate. Cake. Cookies. Candy. Other Potato chips. Pretzels. Salted nuts. Candied nuts. The items listed above may not be a complete list of foods and beverages to avoid. Contact your dietitian for more information.   This information is not intended to replace advice given to you by your health care provider. Make sure you discuss any questions you have with your health care provider.   Document Released: 09/07/2005 Document Revised: 12/04/2014 Document Reviewed: 06/16/2014 Elsevier Interactive Patient Education [employer]

## 2016-06-14 NOTE — Assessment & Plan Note (Signed)
Pt toakes flexeril prn- works well,

## 2016-06-18 ENCOUNTER — Encounter: Payer: Self-pay | Admitting: Family Medicine

## 2016-06-18 DIAGNOSIS — R600 Localized edema: Secondary | ICD-10-CM | POA: Insufficient documentation

## 2016-06-18 DIAGNOSIS — T85898A Other specified complication of other internal prosthetic devices, implants and grafts, initial encounter: Secondary | ICD-10-CM

## 2016-06-18 DIAGNOSIS — G4700 Insomnia, unspecified: Secondary | ICD-10-CM | POA: Insufficient documentation

## 2016-06-18 DIAGNOSIS — Z90710 Acquired absence of both cervix and uterus: Secondary | ICD-10-CM | POA: Insufficient documentation

## 2016-06-18 DIAGNOSIS — Z9884 Bariatric surgery status: Secondary | ICD-10-CM | POA: Insufficient documentation

## 2016-06-18 DIAGNOSIS — R609 Edema, unspecified: Secondary | ICD-10-CM | POA: Insufficient documentation

## 2016-06-18 DIAGNOSIS — F10982 Alcohol use, unspecified with alcohol-induced sleep disorder: Secondary | ICD-10-CM | POA: Insufficient documentation

## 2016-06-18 DIAGNOSIS — N289 Disorder of kidney and ureter, unspecified: Secondary | ICD-10-CM | POA: Insufficient documentation

## 2016-06-18 DIAGNOSIS — K289 Gastrojejunal ulcer, unspecified as acute or chronic, without hemorrhage or perforation: Secondary | ICD-10-CM

## 2016-06-18 DIAGNOSIS — S37009A Unspecified injury of unspecified kidney, initial encounter: Secondary | ICD-10-CM | POA: Insufficient documentation

## 2016-06-18 HISTORY — DX: Gastrojejunal ulcer, unspecified as acute or chronic, without hemorrhage or perforation: K28.9

## 2016-06-18 HISTORY — DX: Other specified complication of other internal prosthetic devices, implants and grafts, initial encounter: T85.898A

## 2016-06-18 NOTE — Assessment & Plan Note (Signed)
Patient seen by Dr. Elnoria Howard of GI.   She had a GE junction ulcer in 2016.

## 2016-06-20 ENCOUNTER — Other Ambulatory Visit (INDEPENDENT_AMBULATORY_CARE_PROVIDER_SITE_OTHER): Payer: Medicare Other

## 2016-06-20 ENCOUNTER — Other Ambulatory Visit: Payer: Self-pay

## 2016-06-20 DIAGNOSIS — Z8639 Personal history of other endocrine, nutritional and metabolic disease: Secondary | ICD-10-CM

## 2016-06-20 DIAGNOSIS — IMO0001 Reserved for inherently not codable concepts without codable children: Secondary | ICD-10-CM

## 2016-06-20 DIAGNOSIS — E038 Other specified hypothyroidism: Secondary | ICD-10-CM

## 2016-06-20 DIAGNOSIS — I10 Essential (primary) hypertension: Secondary | ICD-10-CM

## 2016-06-20 LAB — POCT GLYCOSYLATED HEMOGLOBIN (HGB A1C): Hemoglobin A1C: 5.5

## 2016-06-21 LAB — CBC WITH DIFFERENTIAL/PLATELET
Basophils Absolute: 146 cells/uL (ref 0–200)
Basophils Relative: 2 %
Eosinophils Absolute: 219 cells/uL (ref 15–500)
Eosinophils Relative: 3 %
HCT: 41.4 % (ref 35.0–45.0)
Hemoglobin: 14 g/dL (ref 11.7–15.5)
Lymphocytes Relative: 40 %
Lymphs Abs: 2920 cells/uL (ref 850–3900)
MCH: 35.8 pg — ABNORMAL HIGH (ref 27.0–33.0)
MCHC: 33.8 g/dL (ref 32.0–36.0)
MCV: 105.9 fL — ABNORMAL HIGH (ref 80.0–100.0)
MPV: 9.7 fL (ref 7.5–12.5)
Monocytes Absolute: 584 cells/uL (ref 200–950)
Monocytes Relative: 8 %
Neutro Abs: 3431 cells/uL (ref 1500–7800)
Neutrophils Relative %: 47 %
Platelets: 296 10*3/uL (ref 140–400)
RBC: 3.91 MIL/uL (ref 3.80–5.10)
RDW: 13.8 % (ref 11.0–15.0)
WBC: 7.3 10*3/uL (ref 3.8–10.8)

## 2016-06-21 LAB — COMPREHENSIVE METABOLIC PANEL
ALT: 36 U/L — ABNORMAL HIGH (ref 6–29)
AST: 43 U/L — ABNORMAL HIGH (ref 10–35)
Albumin: 3.6 g/dL (ref 3.6–5.1)
Alkaline Phosphatase: 88 U/L (ref 33–130)
BUN: 11 mg/dL (ref 7–25)
CO2: 29 mmol/L (ref 20–31)
Calcium: 9 mg/dL (ref 8.6–10.4)
Chloride: 97 mmol/L — ABNORMAL LOW (ref 98–110)
Creat: 0.89 mg/dL (ref 0.50–0.99)
Glucose, Bld: 114 mg/dL — ABNORMAL HIGH (ref 65–99)
Potassium: 4.7 mmol/L (ref 3.5–5.3)
Sodium: 137 mmol/L (ref 135–146)
Total Bilirubin: 0.7 mg/dL (ref 0.2–1.2)
Total Protein: 6.2 g/dL (ref 6.1–8.1)

## 2016-06-21 LAB — TSH: TSH: 8.36 mIU/L — ABNORMAL HIGH

## 2016-06-21 LAB — FERRITIN: Ferritin: 179 ng/mL (ref 20–288)

## 2016-06-21 LAB — VITAMIN B12: Vitamin B-12: 238 pg/mL (ref 200–1100)

## 2016-06-21 LAB — VITAMIN D 25 HYDROXY (VIT D DEFICIENCY, FRACTURES): Vit D, 25-Hydroxy: 21 ng/mL — ABNORMAL LOW (ref 30–100)

## 2016-06-27 ENCOUNTER — Encounter: Payer: Self-pay | Admitting: Family Medicine

## 2016-06-27 ENCOUNTER — Ambulatory Visit (INDEPENDENT_AMBULATORY_CARE_PROVIDER_SITE_OTHER): Payer: Medicare Other | Admitting: Family Medicine

## 2016-06-27 VITALS — BP 146/85 | HR 77 | Wt 257.0 lb

## 2016-06-27 DIAGNOSIS — Z9889 Other specified postprocedural states: Secondary | ICD-10-CM | POA: Diagnosis not present

## 2016-06-27 DIAGNOSIS — E038 Other specified hypothyroidism: Secondary | ICD-10-CM | POA: Diagnosis not present

## 2016-06-27 DIAGNOSIS — F10982 Alcohol use, unspecified with alcohol-induced sleep disorder: Secondary | ICD-10-CM

## 2016-06-27 DIAGNOSIS — Z9884 Bariatric surgery status: Secondary | ICD-10-CM

## 2016-06-27 DIAGNOSIS — R609 Edema, unspecified: Secondary | ICD-10-CM

## 2016-06-27 DIAGNOSIS — F1010 Alcohol abuse, uncomplicated: Secondary | ICD-10-CM | POA: Diagnosis not present

## 2016-06-27 DIAGNOSIS — I10 Essential (primary) hypertension: Secondary | ICD-10-CM | POA: Diagnosis not present

## 2016-06-27 DIAGNOSIS — D509 Iron deficiency anemia, unspecified: Secondary | ICD-10-CM

## 2016-06-27 NOTE — Progress Notes (Signed)
Assessment and plan:  1. Essential hypertension   2. Other specified hypothyroidism   3. Alcohol abuse   4. H/O gastric bypass   5. Morbid obesity, unspecified obesity type ([place])   6. Peripheral edema- b/l L Ext   7. h/o Iron deficiency anemia   8. Insomnia due to alcohol excess and stress     Blood pressure a little higher than prior but still within normal limits for age and does not warrant medication change at this time.   Patient will monitor at home.   Avoid salt and excess alcohol.  Exercise and weight loss encouraged.   TSH is elevated-we will make a change to her Synthroid - increase dose. Recheck TSH in 6-8 weeks.   For some reason her lipid profile was not obtained and we will get that in 6-8 weeks when we recheck her TSH.   Vitamin D is too low; patient prefers daily doses of 5000 IUs. Recheck in 6 months or so   ALT and AST elevated likely due to her excess alcohol intake.   Morbidly obese with 13 pound weight gain since March.  Advised against Benadryl plus benzodiazepines nightly for sleep. Discussed risks of benzodiazepines plus heavy alcohol intake especially in elderly.   Orders Placed This Encounter  Procedures  . TSH  . Lipid panel     Patient's Medications  New Prescriptions   CHOLECALCIFEROL (VITAMIN D3) 5000 UNITS TABS    5,000 IU OTC vitamin D3 daily.   LEVOTHYROXINE (SYNTHROID, LEVOTHROID) 150 MCG TABLET    Take 1 tablet (150 mcg total) by mouth daily.  Previous Medications   ACETAMINOPHEN (TYLENOL) 325 MG TABLET    Take 650 mg by mouth every 6 (six) hours as needed.   BENAZEPRIL-HYDROCHLORTHIAZIDE (LOTENSIN HCT) 20-25 MG TABLET    Take 1 tablet by mouth daily.   CYCLOBENZAPRINE (FLEXERIL) 5 MG TABLET    Take 1 tablet (5 mg total) by mouth 3 (three) times daily as needed for muscle spasms.   DICLOFENAC (VOLTAREN) 75 MG EC TABLET    Take 1 tablet (75 mg total) by mouth 2 (two)  times daily as needed.   DIPHENHYDRAMINE (SOMINEX) 25 MG TABLET    Take 25 mg by mouth at bedtime as needed for sleep.   FERROUS SULFATE 325 (65 FE) MG TABLET    Take 325 mg by mouth daily with breakfast.   FUROSEMIDE (LASIX) 40 MG TABLET    Take 1 tablet (40 mg total) by mouth daily.   LORAZEPAM (ATIVAN) 1 MG TABLET    Take 1 tablet (1 mg total) by mouth every 12 (twelve) hours as needed for anxiety or sleep.   MAGNESIUM 30 MG TABLET    Take 30 mg by mouth 2 (two) times daily.   MELATONIN 5 MG CAPS    Take 5 mg by mouth at bedtime.   MULTIPLE VITAMIN (MULTIVITAMIN) TABLET    Take 1 tablet by mouth daily.   ONDANSETRON (ZOFRAN) 4 MG TABLET    Take 4 mg by mouth every 8 (eight) hours as needed for nausea or vomiting.   PANTOPRAZOLE (PROTONIX) 40 MG TABLET    Take 1 tablet (40 mg total) by mouth 2 (two) times daily.  Modified Medications   No medications on file  Discontinued Medications   CHOLECALCIFEROL (VITAMIN D PO)    Take 1 tablet by mouth daily as needed (supplement).    LEVOTHYROXINE (SYNTHROID, LEVOTHROID) 125 MCG TABLET  Take 1 tablet (125 mcg total) by mouth daily.    Return in about 7 weeks (around 08/15/2016) for tsh lab .  Anticipatory guidance and routine counseling done re: condition, txmnt options and need for follow up. All questions of patient's were answered.   Gross side effects, risk and benefits, and alternatives of medications discussed with patient.  Patient is aware that all medications have potential side effects and we are unable to predict every sideeffect or drug-drug interaction that may occur.  Expresses verbal understanding and consents to current therapy plan and treatment regiment.  Please see AVS handed out to patient at the end of our visit for additional patient instructions/ counseling done pertaining to today's office visit.  Note: This document was prepared using Dragon voice recognition software and may include unintentional dictation  errors.   ---------------------------------------------------------------------------------------------------------------------- OV #2- Subjective:     CC: [name] is a 70 y.o. female who presents to [place] at [facility] today for for follow-up of her recent labs.   Patient is continuing to drink her wine to excess with very little water intake.  Poor nutrition.  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  Her past time and hobbies include reading and watching TV.     Morbidly obese: Patient has gained 13 pounds since 07-Feb-2023.   Her mother died in March 10, 2023.   Emotionally patient states that she is feeling well with her passing as she was 89 and patient states it was her time to go.   Wt Readings from Last 3 Encounters:  06/27/16 257 lb (116.6 kg)  06/14/16 250 lb (113.4 kg)  02/13/16 244 lb (110.7 kg)   BP Readings from Last 3 Encounters:  06/27/16 (!) 146/85  06/14/16 114/72  02/13/16 130/70   Pulse Readings from Last 3 Encounters:  06/27/16 77  06/14/16 85  02/13/16 83    Full medical history updated and reviewed in the office today  Patient Active Problem List   Diagnosis Date Noted  . HTN (hypertension) 04/25/2015    Priority: High  . Anastomotic ulcer S/P gastric bypass 06/18/2016  . Morbid obesity ([place]) 06/18/2016  . R Congenital Kidney ABN: Ess only has one fxn kidney 06/18/2016  . History of hysterectomy 06/18/2016  . H/O gastric bypass 06/18/2016  . Peripheral edema- b/l L Ext 06/18/2016  . Insomnia due to alcohol excess and stress 06/18/2016  . GAD (generalized anxiety disorder) 06/14/2016  . h/o Iron deficiency anemia 06/14/2016  . Myalgia and myositis 06/14/2016  . Hypothyroidism 02/13/2016  . Stress at home 02/13/2016  . UGIB (upper gastrointestinal bleed) 04/25/2015  . Alcohol abuse  04/25/2015  . Acute ulcer at gastrojejunal region   . Distal radius fracture, right 01/15/2013    Past Medical History:  Diagnosis Date  . Alcohol abuse 04/25/2015  . Allergy   . Anastomotic ulcer S/P gastric bypass 06/18/2016  . Arthritis   . Cataract   . Degenerative disc disease, cervical   . GAD (generalized anxiety disorder) 06/14/2016  . HTN (hypertension) 04/25/2015  . Hypertension   . Hypothyroidism   . 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 Substance abuse    Alcohol abuse 02/07/2015  . UGIB (upper gastrointestinal bleed) 04/25/2015    Past Surgical History:  Procedure Laterality  Date  . CHOLECYSTECTOMY    . COSMETIC SURGERY    . DIAGNOSTIC LAPAROSCOPY    . ESOPHAGOGASTRODUODENOSCOPY N/A 04/25/2015   Procedure: ESOPHAGOGASTRODUODENOSCOPY (EGD);  Surgeon: Jerene Bears, MD;  Location: [facility] ENDOSCOPY;  Service: Endoscopy;  Laterality: N/A;  . FRACTURE SURGERY    . GASTRIC BYPASS    . OPEN REDUCTION INTERNAL FIXATION (ORIF) DISTAL RADIAL FRACTURE Right 01/15/2013   Procedure: OPEN REDUCTION INTERNAL FIXATION (ORIF) Right DISTAL RADIUS FRACTURE;  Surgeon: Marybelle Killings, MD;  Location: [facility];  Service: Orthopedics;  Laterality: Right;    Social History  Substance Use Topics  . Smoking status: Never Smoker  . Smokeless tobacco: Never Used  . Alcohol use 16.8 oz/week    28 Glasses of wine per week     Comment: drinks daily and has a bottle of wine per day    family history includes COPD in her father; Cancer in her father, mother, and paternal grandmother; Diabetes in her father; Heart disease in her mother; Hypertension in her father and mother; Mental illness in her maternal grandmother; Stroke in her maternal grandmother and mother.   Medications: Current Outpatient Prescriptions  Medication Sig Dispense Refill  . acetaminophen (TYLENOL) 325 MG tablet Take 650 mg by mouth every 6 (six) hours as needed.    .  benazepril-hydrochlorthiazide (LOTENSIN HCT) 20-25 MG tablet Take 1 tablet by mouth daily. 90 tablet 3  . cyclobenzaprine (FLEXERIL) 5 MG tablet Take 1 tablet (5 mg total) by mouth 3 (three) times daily as needed for muscle spasms. 60 tablet 0  . diclofenac (VOLTAREN) 75 MG EC tablet Take 1 tablet (75 mg total) by mouth 2 (two) times daily as needed. 30 tablet 0  . diphenhydrAMINE (SOMINEX) 25 MG tablet Take 25 mg by mouth at bedtime as needed for sleep.    . ferrous sulfate 325 (65 FE) MG tablet Take 325 mg by mouth daily with breakfast.    . furosemide (LASIX) 40 MG tablet Take 1 tablet (40 mg total) by mouth daily. 90 tablet 3  . LORazepam (ATIVAN) 1 MG tablet Take 1 tablet (1 mg total) by mouth every 12 (twelve) hours as needed for anxiety or sleep. 60 tablet 0  . magnesium 30 MG tablet Take 30 mg by mouth 2 (two) times daily.    . Melatonin 5 MG CAPS Take 5 mg by mouth at bedtime.    . Multiple Vitamin (MULTIVITAMIN) tablet Take 1 tablet by mouth daily.    . ondansetron (ZOFRAN) 4 MG tablet Take 4 mg by mouth every 8 (eight) hours as needed for nausea or vomiting.    . pantoprazole (PROTONIX) 40 MG tablet Take 1 tablet (40 mg total) by mouth 2 (two) times daily. 180 tablet 3  . Cholecalciferol (VITAMIN D3) 5000 units TABS 5,000 IU OTC vitamin D3 daily. 90 tablet 12  . levothyroxine (SYNTHROID, LEVOTHROID) 150 MCG tablet Take 1 tablet (150 mcg total) by mouth daily. 90 tablet 1   No current facility-administered medications for this visit.     Allergies:  Allergies  Allergen Reactions  . Cocoa Anaphylaxis  . Red Dye Hives    Rash and Nausea diarrhea  . Lyrica [Pregabalin] Other (See Comments)    Abnormal bleeding  . Sulfa Antibiotics Rash     ROS:  Const:    no fevers, chills Eyes:    conjunctiva clear, no vision changes or blurred vision ENT:  no hearing difficulties, no dysphagia, no dysphonia, no nose bleeds CV:  no chest pain, arrhythmias, no orthopnea, no PND Pulm:   no  SOB at rest or exertion, no Wheeze, no DIB, no hemoptysis GI:    no N/V/D/C, no abd pain GU:   no blood in urine or inc freq or urgency Heme/Onc:    no unexplained bleeding, no night sweats, no more fatigue than usual Neuro:   No dizziness, no LOC, No unexplained weakness or numbness Endo:   no unexplained wt loss or gain M-Sk:   no localized myalgias or arthralgias Psych:    No SI/HI, no memory prob or unexplained confusion    Objective:  Blood pressure (!) 146/85, pulse 77, weight 257 lb (116.6 kg). Body mass index is 41.48 kg/m.  Gen: Well NAD, A and O *3 HEENT: /AT, EOMI,  MMM, OP- clr Lungs: Normal work of breathing. CTA B/L, no Wh, rhonchi Heart: RRR, S1, S2 WNL's, no MRG Abd: Soft. No gross distention Exts: warm, pink,  Brisk capillary refill, warm and well perfused, bilateral pitting edema-stable.    Recent Results (from the past 2160 hour(s))  CBC w/Diff     Status: Abnormal   Collection Time: 06/20/16  9:26 AM  Result Value Ref Range   WBC 7.3 3.8 - 10.8 K/uL   RBC 3.91 3.80 - 5.10 MIL/uL   Hemoglobin 14.0 11.7 - 15.5 g/dL   HCT 41.4 35.0 - 45.0 %   MCV 105.9 (H) 80.0 - 100.0 fL   MCH 35.8 (H) 27.0 - 33.0 pg   MCHC 33.8 32.0 - 36.0 g/dL   RDW 13.8 11.0 - 15.0 %   Platelets 296 140 - 400 K/uL   MPV 9.7 7.5 - 12.5 fL   Neutro Abs 3,431 1,500 - 7,800 cells/uL   Lymphs Abs 2,920 850 - 3,900 cells/uL   Monocytes Absolute 584 200 - 950 cells/uL   Eosinophils Absolute 219 15 - 500 cells/uL   Basophils Absolute 146 0 - 200 cells/uL   Neutrophils Relative % 47 %   Lymphocytes Relative 40 %   Monocytes Relative 8 %   Eosinophils Relative 3 %   Basophils Relative 2 %   Smear Review Criteria for review not met     Comment: ** Please note change in unit of measure and reference range(s). **  Comp Met (CMET)     Status: Abnormal   Collection Time: 06/20/16  9:26 AM  Result Value Ref Range   Sodium 137 135 - 146 mmol/L   Potassium 4.7 3.5 - 5.3 mmol/L   Chloride 97  (L) 98 - 110 mmol/L   CO2 29 20 - 31 mmol/L   Glucose, Bld 114 (H) 65 - 99 mg/dL   BUN 11 7 - 25 mg/dL   Creat 0.89 0.50 - 0.99 mg/dL    Comment:   For patients > or = 70 years of age: The upper reference limit for Creatinine is approximately 13% higher for people identified as African-American.      Total Bilirubin 0.7 0.2 - 1.2 mg/dL   Alkaline Phosphatase 88 33 - 130 U/L   AST 43 (H) 10 - 35 U/L   ALT 36 (H) 6 - 29 U/L   Total Protein 6.2 6.1 - 8.1 g/dL   Albumin 3.6 3.6 - 5.1 g/dL   Calcium 9.0 8.6 - 10.4 mg/dL  TSH     Status: Abnormal   Collection Time: 06/20/16  9:26 AM  Result Value Ref Range   TSH 8.36 (H) mIU/L    Comment:  Reference Range   > or = 20 Years  0.40-4.50   Pregnancy Range First trimester  0.26-2.66 Second trimester 0.55-2.73 Third trimester  0.43-2.91     VITAMIN D 25 Hydroxy (Vit-D Deficiency, Fractures)     Status: Abnormal   Collection Time: 06/20/16  9:26 AM  Result Value Ref Range   Vit D, 25-Hydroxy 21 (L) 30 - 100 ng/mL    Comment: Vitamin D Status           25-OH Vitamin D        Deficiency                <20 ng/mL        Insufficiency         20 - 29 ng/mL        Optimal             > or = 30 ng/mL   For 25-OH Vitamin D testing on patients on D2-supplementation and patients for whom quantitation of D2 and D3 fractions is required, the QuestAssureD 25-OH VIT D, (D2,D3), LC/MS/MS is recommended: order code [phone] (patients > 2 yrs).   B12     Status: None   Collection Time: 06/20/16  9:26 AM  Result Value Ref Range   Vitamin B-12 238 200 - 1,100 pg/mL  Ferritin     Status: None   Collection Time: 06/20/16  9:26 AM  Result Value Ref Range   Ferritin 179 20 - 288 ng/mL  POCT glycosylated hemoglobin (Hb A1C)     Status: Normal   Collection Time: 06/20/16  9:59 AM  Result Value Ref Range   Hemoglobin A1C 5.5

## 2016-06-27 NOTE — Patient Instructions (Signed)
    Mediterranean Diet  Why follow it? Research shows. . Those who follow the Mediterranean diet have a reduced risk of heart disease  . The diet is associated with a reduced incidence of Parkinson's and Alzheimer's diseases . People following the diet may have longer life expectancies and lower rates of chronic diseases  . The Dietary Guidelines for Americans recommends the Mediterranean diet as an eating plan to promote health and prevent disease  What Is the Mediterranean Diet?  . Healthy eating plan based on typical foods and recipes of Mediterranean-style cooking . The diet is primarily a plant based diet; these foods should make up a majority of meals   Starches - Plant based foods should make up a majority of meals - They are an important sources of vitamins, minerals, energy, antioxidants, and fiber - Choose whole grains, foods high in fiber and minimally processed items  - Typical grain sources include wheat, oats, barley, corn, brown rice, bulgar, farro, millet, polenta, couscous  - Various types of beans include chickpeas, lentils, fava beans, black beans, white beans   Fruits  Veggies - Large quantities of antioxidant rich fruits & veggies; 6 or more servings  - Vegetables can be eaten raw or lightly drizzled with oil and cooked  - Vegetables common to the traditional Mediterranean Diet include: artichokes, arugula, beets, broccoli, brussel sprouts, cabbage, carrots, celery, collard greens, cucumbers, eggplant, kale, leeks, lemons, lettuce, mushrooms, okra, onions, peas, peppers, potatoes, pumpkin, radishes, rutabaga, shallots, spinach, sweet potatoes, turnips, zucchini - Fruits common to the Mediterranean Diet include: apples, apricots, avocados, cherries, clementines, dates, figs, grapefruits, grapes, melons, nectarines, oranges, peaches, pears, pomegranates, strawberries, tangerines  Fats - Replace butter and margarine with healthy oils, such as olive oil, canola oil, and tahini   - Limit nuts to no more than a handful a day  - Nuts include walnuts, almonds, pecans, pistachios, pine nuts  - Limit or avoid candied, honey roasted or heavily salted nuts - Olives are central to the Mediterranean diet - can be eaten whole or used in a variety of dishes   Meats Protein - Limiting red meat: no more than a few times a month - When eating red meat: choose lean cuts and keep the portion to the size of deck of cards - Eggs: approx. 0 to 4 times a week  - Fish and lean poultry: at least 2 a week  - Healthy protein sources include, chicken, turkey, lean beef, lamb - Increase intake of seafood such as tuna, salmon, trout, mackerel, shrimp, scallops - Avoid or limit high fat processed meats such as sausage and bacon  Dairy - Include moderate amounts of low fat dairy products  - Focus on healthy dairy such as fat free yogurt, skim milk, low or reduced fat cheese - Limit dairy products higher in fat such as whole or 2% milk, cheese, ice cream  Alcohol - Moderate amounts of red wine is ok  - No more than 5 oz daily for women (all ages) and men older than age 65  - No more than 10 oz of wine daily for men younger than 65  Other - Limit sweets and other desserts  - Use herbs and spices instead of salt to flavor foods  - Herbs and spices common to the traditional Mediterranean Diet include: basil, bay leaves, chives, cloves, cumin, fennel, garlic, lavender, marjoram, mint, oregano, parsley, pepper, rosemary, sage, savory, sumac, tarragon, thyme   It's not just a diet, it's a   lifestyle:  . The Mediterranean diet includes lifestyle factors typical of those in the region  . Foods, drinks and meals are best eaten with others and savored . Daily physical activity is important for overall good health . This could be strenuous exercise like running and aerobics . This could also be more leisurely activities such as walking, housework, yard-work, or taking the stairs . Moderation is the key;  a balanced and healthy diet accommodates most foods and drinks . Consider portion sizes and frequency of consumption of certain foods   Meal Ideas & Options:  . Breakfast:  o Whole wheat toast or whole wheat English muffins with peanut butter & hard boiled egg o Steel cut oats topped with apples & cinnamon and skim milk  o Fresh fruit: banana, strawberries, melon, berries, peaches  o Smoothies: strawberries, bananas, greek yogurt, peanut butter o Low fat greek yogurt with blueberries and granola  o Egg white omelet with spinach and mushrooms o Breakfast couscous: whole wheat couscous, apricots, skim milk, cranberries  . Sandwiches:  o Hummus and grilled vegetables (peppers, zucchini, squash) on whole wheat bread   o Grilled chicken on whole wheat pita with lettuce, tomatoes, cucumbers or tzatziki  o Tuna salad on whole wheat bread: tuna salad made with greek yogurt, olives, red peppers, capers, green onions o Garlic rosemary lamb pita: lamb sauted with garlic, rosemary, salt & pepper; add lettuce, cucumber, greek yogurt to pita - flavor with lemon juice and black pepper  . Seafood:  o Mediterranean grilled salmon, seasoned with garlic, basil, parsley, lemon juice and black pepper o Shrimp, lemon, and spinach whole-grain pasta salad made with low fat greek yogurt  o Seared scallops with lemon orzo  o Seared tuna steaks seasoned salt, pepper, coriander topped with tomato mixture of olives, tomatoes, olive oil, minced garlic, parsley, green onions and cappers  . Meats:  o Herbed greek chicken salad with kalamata olives, cucumber, feta  o Red bell peppers stuffed with spinach, bulgur, lean ground beef (or lentils) & topped with feta   o Kebabs: skewers of chicken, tomatoes, onions, zucchini, squash  o Turkey burgers: made with red onions, mint, dill, lemon juice, feta cheese topped with roasted red peppers . Vegetarian o Cucumber salad: cucumbers, artichoke hearts, celery, red onion, feta  cheese, tossed in olive oil & lemon juice  o Hummus and whole grain pita points with a greek salad (lettuce, tomato, feta, olives, cucumbers, red onion) o Lentil soup with celery, carrots made with vegetable broth, garlic, salt and pepper  o Tabouli salad: parsley, bulgur, mint, scallions, cucumbers, tomato, radishes, lemon juice, olive oil, salt and pepper.      Exercising to Lose Weight Exercising can help you to lose weight. In order to lose weight through exercise, you need to do vigorous-intensity exercise. You can tell that you are exercising with vigorous intensity if you are breathing very hard and fast and cannot hold a conversation while exercising. Moderate-intensity exercise helps to maintain your current weight. You can tell that you are exercising at a moderate level if you have a higher heart rate and faster breathing, but you are still able to hold a conversation. HOW OFTEN SHOULD I EXERCISE? Choose an activity that you enjoy and set realistic goals. Your health care provider can help you to make an activity plan that works for you. Exercise regularly as directed by your health care provider. This may include:  Doing resistance training twice each week, such as:  Push-ups.    Sit-ups.  Lifting weights.  Using resistance bands.  Doing a given intensity of exercise for a given amount of time. Choose from these options:  150 minutes of moderate-intensity exercise every week.  75 minutes of vigorous-intensity exercise every week.  A mix of moderate-intensity and vigorous-intensity exercise every week. Children, pregnant women, people who are out of shape, people who are overweight, and older adults may need to consult a health care provider for individual recommendations. If you have any sort of medical condition, be sure to consult your health care provider before starting a new exercise program. WHAT ARE SOME ACTIVITIES THAT CAN HELP ME TO LOSE WEIGHT?   Walking at a  rate of at least 4.5 miles an hour.  Jogging or running at a rate of 5 miles per hour.  Biking at a rate of at least 10 miles per hour.  Lap swimming.  Roller-skating or in-line skating.  Cross-country skiing.  Vigorous competitive sports, such as football, basketball, and soccer.  Jumping rope.  Aerobic dancing. HOW CAN I BE MORE ACTIVE IN MY DAY-TO-DAY ACTIVITIES?  Use the stairs instead of the elevator.  Take a walk during your lunch break.  If you drive, park your car farther away from work or school.  If you take public transportation, get off one stop early and walk the rest of the way.  Make all of your phone calls while standing up and walking around.  Get up, stretch, and walk around every 30 minutes throughout the day. WHAT GUIDELINES SHOULD I FOLLOW WHILE EXERCISING?  Do not exercise so much that you hurt yourself, feel dizzy, or get very short of breath.  Consult your health care provider prior to starting a new exercise program.  Wear comfortable clothes and shoes with good support.  Drink plenty of water while you exercise to prevent dehydration or heat stroke. Body water is lost during exercise and must be replaced.  Work out until you breathe faster and your heart beats faster.   This information is not intended to replace advice given to you by your health care provider. Make sure you discuss any questions you have with your health care provider.   Document Released: 12/16/2010 Document Revised: 12/04/2014 Document Reviewed: 04/16/2014 Elsevier Interactive Patient Education 2016 [employer]  

## 2016-06-28 ENCOUNTER — Telehealth: Payer: Self-pay

## 2016-06-28 MED ORDER — VITAMIN D3 125 MCG (5000 UT) PO TABS: ORAL_TABLET | ORAL | 12 refills | Status: DC

## 2016-06-28 MED ORDER — LEVOTHYROXINE SODIUM 150 MCG PO TABS: 150.0000 ug | ORAL_TABLET | Freq: Every day | ORAL | 1 refills | Status: DC

## 2016-06-28 NOTE — Telephone Encounter (Signed)
Synthroid 125 g is on patient's chart/ currently on her med list.   This is what she has been on for a long time now and recently I bumped up her Synthroid to 150 g.  [name] looked at her chart and order was in there but it was not signed off on. So I just did so a few minutes ago. The medicine order is at the pharmacy now

## 2016-06-28 NOTE — Telephone Encounter (Signed)
[name] called and stated she was suppose to get an increase in her levothyroxine due to abnormal TSH. There is no levothyroxine on her mediation list. She states that Dr Sharee Holster was going to increase levothyroxine from 112 mcg to 150 mcg. She needs this to be sent to [facility].

## 2016-06-28 NOTE — Telephone Encounter (Signed)
Pt informed.  T. Sequoia Mincey, CMA 

## 2016-08-12 DIAGNOSIS — Z0000 Encounter for general adult medical examination without abnormal findings: Secondary | ICD-10-CM | POA: Diagnosis not present

## 2016-08-12 DIAGNOSIS — R300 Dysuria: Secondary | ICD-10-CM | POA: Diagnosis not present

## 2016-08-13 DIAGNOSIS — R300 Dysuria: Secondary | ICD-10-CM | POA: Diagnosis not present

## 2016-08-15 ENCOUNTER — Other Ambulatory Visit: Payer: Self-pay

## 2016-08-15 ENCOUNTER — Other Ambulatory Visit (INDEPENDENT_AMBULATORY_CARE_PROVIDER_SITE_OTHER): Payer: Medicare Other

## 2016-08-15 DIAGNOSIS — Z13220 Encounter for screening for lipoid disorders: Secondary | ICD-10-CM | POA: Diagnosis not present

## 2016-08-15 DIAGNOSIS — F1010 Alcohol abuse, uncomplicated: Secondary | ICD-10-CM

## 2016-08-15 DIAGNOSIS — I10 Essential (primary) hypertension: Secondary | ICD-10-CM

## 2016-08-15 DIAGNOSIS — E038 Other specified hypothyroidism: Secondary | ICD-10-CM | POA: Diagnosis not present

## 2016-08-15 DIAGNOSIS — Z9884 Bariatric surgery status: Secondary | ICD-10-CM

## 2016-08-15 NOTE — Progress Notes (Signed)
tsh

## 2016-08-16 LAB — LIPID PANEL
Cholesterol: 191 mg/dL (ref 125–200)
HDL: 54 mg/dL (ref >=46)
LDL Cholesterol: 95 mg/dL (ref <130)
Total CHOL/HDL Ratio: 3.5 Ratio (ref <=5.0)
Triglycerides: 210 mg/dL — ABNORMAL HIGH (ref <150)
VLDL: 42 mg/dL — ABNORMAL HIGH (ref <30)

## 2016-08-16 LAB — TSH: TSH: 2.92 mIU/L

## 2016-09-06 ENCOUNTER — Telehealth: Payer: Self-pay

## 2016-09-06 NOTE — Telephone Encounter (Signed)
Attempted to reach patient by phone regarding notification received that she had not read her MyChart message sent regarding follow up.  Tiajuana Amass. [name], CMA

## 2016-09-29 ENCOUNTER — Ambulatory Visit (INDEPENDENT_AMBULATORY_CARE_PROVIDER_SITE_OTHER): Payer: Medicare Other | Admitting: Family Medicine

## 2016-09-29 ENCOUNTER — Encounter: Payer: Self-pay | Admitting: Family Medicine

## 2016-09-29 VITALS — BP 151/84 | HR 77 | Ht 66.0 in | Wt 257.5 lb

## 2016-09-29 DIAGNOSIS — I10 Essential (primary) hypertension: Secondary | ICD-10-CM

## 2016-09-29 DIAGNOSIS — F4323 Adjustment disorder with mixed anxiety and depressed mood: Secondary | ICD-10-CM | POA: Insufficient documentation

## 2016-09-29 DIAGNOSIS — F1010 Alcohol abuse, uncomplicated: Secondary | ICD-10-CM

## 2016-09-29 DIAGNOSIS — E781 Pure hyperglyceridemia: Secondary | ICD-10-CM | POA: Diagnosis not present

## 2016-09-29 DIAGNOSIS — E559 Vitamin D deficiency, unspecified: Secondary | ICD-10-CM | POA: Insufficient documentation

## 2016-09-29 DIAGNOSIS — E782 Mixed hyperlipidemia: Secondary | ICD-10-CM | POA: Insufficient documentation

## 2016-09-29 DIAGNOSIS — E785 Hyperlipidemia, unspecified: Secondary | ICD-10-CM | POA: Insufficient documentation

## 2016-09-29 MED ORDER — CARVEDILOL 3.125 MG PO TABS: 3.1250 mg | ORAL_TABLET | Freq: Two times a day (BID) | ORAL | 1 refills | Status: DC

## 2016-09-29 MED ORDER — FLUOXETINE HCL 10 MG PO TABS: ORAL_TABLET | ORAL | 0 refills | Status: DC

## 2016-09-29 NOTE — Progress Notes (Signed)
Assessment and plan:  1. Hypertriglyceridemia   2. Mixed hyperlipidemia   3. Essential hypertension   4. Morbid obesity (HCC)   5. Alcohol abuse   6. Adjustment disorder with mixed anxiety and depressed mood     HLD (hyperlipidemia) 17% 10 yr risk.  I rec Chol meds but pt would like to try 4-29mo diet/ lifestyle changes/ exercise.  If in future, 10-yr risk is not < 7%- agrees to go on meds.   Adjustment disorder with mixed anxiety and depressed mood Prozac started  counseling done and I rec CBT with a professional counselor  Hypertriglyceridemia Must cut back on wine.  Causing inc in TG's.    Counseling done  HTN (hypertension) On lotensin and and still not at goal.    Start coreg.   Risks/ benefits meds d/c pt and pt will read drug information to further educate self about drug prior to starting it.  Contact us prior with any Q's/ concerns.    New Prescriptions   CARVEDILOL (COREG) 3.125 MG TABLET    Take 1 tablet (3.125 mg total) by mouth 2 (two) times daily with a meal.   FLUOXETINE (PROZAC) 10 MG TABLET    Use one half tablet daily for 6days,  then increase to 1 tablet daily    Modified Medications   No medications on file    Discontinued Medications   CYCLOBENZAPRINE (FLEXERIL) 5 MG TABLET    Take 1 tablet (5 mg total) by mouth 3 (three) times daily as needed for muscle spasms.   LORAZEPAM (ATIVAN) 1 MG TABLET    Take 1 tablet (1 mg total) by mouth every 12 (twelve) hours as needed for anxiety or sleep.   ONDANSETRON (ZOFRAN) 4 MG TABLET    Take 4 mg by mouth every 8 (eight) hours as needed for nausea or vomiting.    Return in about 4 weeks (around 10/27/2016) for started coreg and prozac.  Anticipatory guidance and routine counseling done re: condition, txmnt options and need for follow up. All questions of patient's were answered.   Gross side effects, risk and benefits, and alternatives of  medications discussed with patient.  Patient is aware that all medications have potential side effects and we are unable to predict every sideeffect or drug-drug interaction that may occur.  Expresses verbal understanding and consents to current therapy plan and treatment regiment.  Please see AVS handed out to patient at the end of our visit for additional patient instructions/ counseling done pertaining to today's office visit.  Note: This document was prepared using Dragon voice recognition software and may include unintentional dictation errors.   ----------------------------------------------------------------------------------------------------------------------  Subjective:   CC:   [name] is a 70 y.o. female who presents to [facility] today for review and discussion of recent bloodwork that was done.  Recent blood work from 08/15/16 that we ordered was reviewed with patient today.  Patient was counseled on abnormalities and we discussed dietary and lifestyle changes that could help those values (also medications when appropriate).  Extensive health counseling performed and all patient's concerns/ questions were addressed.   Bp- at home- not checking.  Asx, but not at goal.   Unable to walk due to swelling in legs and inc wt recently made things W; also stopped walking around- retired- has all played role in pt's increasing weight, swelling in her legs, increasing blood pressure etc.  Takes 20mg  lasix 3-4 d/ week  for this - been doing that for over a yr now.    Obese: has gained wt if anything. Poor diet and   ETOH abuse and depressed a little - so drinks. No Si/HI.   I asked her to cut back and she is not sure she can but refuses to go into program, even outpt.  We discussed her going on prozac- she would like to try it   Wt Readings from Last 3 Encounters:  09/29/16 257 lb 8 oz (116.8 kg)  06/27/16 257 lb (116.6 kg)  06/14/16 250 lb (113.4  kg)   BP Readings from Last 3 Encounters:  09/29/16 (!) 151/84  06/27/16 (!) 146/85  06/14/16 114/72   Pulse Readings from Last 3 Encounters:  09/29/16 77  06/27/16 77  06/14/16 85   BMI Readings from Last 3 Encounters:  09/29/16 41.56 kg/m  06/27/16 41.48 kg/m  06/14/16 40.35 kg/m     Patient Care Team    Relationship Specialty Notifications Start End  Thomasene Loteborah Francenia Chimenti, DO PCP - General Family Medicine  06/14/16     Full medical history updated and reviewed in the office today  Patient Active Problem List   Diagnosis Date Noted  . Hypertriglyceridemia 09/29/2016    Priority: High  . HLD (hyperlipidemia) 09/29/2016    Priority: High  . Adjustment disorder with mixed anxiety and depressed mood 09/29/2016    Priority: High  . Anastomotic ulcer S/P gastric bypass 06/18/2016    Priority: High  . Morbid obesity (HCC) 06/18/2016    Priority: High  . R Congenital Kidney ABN: Ess only has one fxn kidney 06/18/2016    Priority: High  . H/O gastric bypass 06/18/2016    Priority: High  . HTN (hypertension) 04/25/2015    Priority: High  . Alcohol abuse 04/25/2015    Priority: High  . Peripheral edema- b/l L Ext w chronic skin changes 06/18/2016    Priority: Medium  . GAD (generalized anxiety disorder) 06/14/2016    Priority: Medium  . UGIB (upper gastrointestinal bleed) 04/25/2015    Priority: Medium  . Acute ulcer at gastrojejunal region     Priority: Medium  . Vitamin D deficiency 09/29/2016    Priority: Low  . h/o Iron deficiency anemia 06/14/2016    Priority: Low  . Hypothyroidism 02/13/2016    Priority: Low  . History of hysterectomy 06/18/2016  . Insomnia due to alcohol excess and stress 06/18/2016  . Myalgia and myositis 06/14/2016  . Stress at home 02/13/2016  . Distal radius fracture, right 01/15/2013    Past Medical History:  Diagnosis Date  . Alcohol abuse 04/25/2015  . Allergy   . Anastomotic ulcer S/P gastric bypass 06/18/2016  . Arthritis   .  Cataract   . Degenerative disc disease, cervical   . GAD (generalized anxiety disorder) 06/14/2016  . HTN (hypertension) 04/25/2015  . Hypertension   . Hypothyroidism   . 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 Substance abuse    Alcohol abuse 2016  . UGIB (upper gastrointestinal bleed) 04/25/2015    Past Surgical History:  Procedure Laterality Date  . CHOLECYSTECTOMY    . COSMETIC SURGERY    . DIAGNOSTIC LAPAROSCOPY    . ESOPHAGOGASTRODUODENOSCOPY N/A 04/25/2015   Procedure: ESOPHAGOGASTRODUODENOSCOPY (EGD);  Surgeon: Beverley FiedlerJay M Pyrtle, MD;  [facility];  Service: Endoscopy;  Laterality: N/A;  . FRACTURE SURGERY    . GASTRIC BYPASS    . OPEN REDUCTION  INTERNAL FIXATION (ORIF) DISTAL RADIAL FRACTURE Right 01/15/2013   Procedure: OPEN REDUCTION INTERNAL FIXATION (ORIF) Right DISTAL RADIUS FRACTURE;  Surgeon: Eldred Manges, MD;  Location: [facility] OR;  Service: Orthopedics;  Laterality: Right;    Social History  Substance Use Topics  . Smoking status: Never Smoker  . Smokeless tobacco: Never Used  . Alcohol use 16.8 oz/week    28 Glasses of wine per week     Comment: drinks daily and has a bottle of wine per day    Family Hx: Family History  Problem Relation Age of Onset  . Heart disease Mother   . Stroke Mother   . Cancer Mother     skin and breast  . Hypertension Mother   . COPD Father   . Cancer Father   . Diabetes Father   . Hypertension Father   . Stroke Maternal Grandmother   . Mental illness Maternal Grandmother   . Cancer Paternal Grandmother      Medications: Current Outpatient Prescriptions  Medication Sig Dispense Refill  . acetaminophen (TYLENOL) 325 MG tablet Take 650 mg by mouth every 6 (six) hours as needed.    . benazepril-hydrochlorthiazide (LOTENSIN HCT) 20-25 MG tablet Take 1 tablet by mouth daily. 90 tablet 3  . Cholecalciferol (VITAMIN D3) 5000 units TABS 5,000 IU OTC vitamin D3 daily. 90 tablet 12    . diclofenac (VOLTAREN) 75 MG EC tablet Take 1 tablet (75 mg total) by mouth 2 (two) times daily as needed. 30 tablet 0  . diphenhydrAMINE (SOMINEX) 25 MG tablet Take 25 mg by mouth at bedtime as needed for sleep.    . ferrous sulfate 325 (65 FE) MG tablet Take 325 mg by mouth daily with breakfast.    . furosemide (LASIX) 40 MG tablet Take 1 tablet (40 mg total) by mouth daily. (Patient taking differently: Take 20 mg by mouth as needed. ) 90 tablet 3  . levothyroxine (SYNTHROID, LEVOTHROID) 150 MCG tablet Take 1 tablet (150 mcg total) by mouth daily. 90 tablet 1  . magnesium 30 MG tablet Take 30 mg by mouth 2 (two) times daily.    . Melatonin 5 MG CAPS Take 5 mg by mouth at bedtime.    . Multiple Vitamin (MULTIVITAMIN) tablet Take 1 tablet by mouth daily.    . pantoprazole (PROTONIX) 40 MG tablet Take 1 tablet (40 mg total) by mouth 2 (two) times daily. (Patient taking differently: Take 40 mg by mouth daily. ) 180 tablet 3  . carvedilol (COREG) 3.125 MG tablet Take 1 tablet (3.125 mg total) by mouth 2 (two) times daily with a meal. 60 tablet 1  . FLUoxetine (PROZAC) 10 MG tablet Use one half tablet daily for 6days,  then increase to 1 tablet daily 90 tablet 0   No current facility-administered medications for this visit.     Allergies:  Allergies  Allergen Reactions  . Cocoa Anaphylaxis  . Red Dye Hives    Rash and Nausea diarrhea  . Lyrica [Pregabalin] Other (See Comments)    Abnormal bleeding  . Sulfa Antibiotics Rash     ROS: Review of Systems  Constitutional: Negative.  Negative for chills, diaphoresis, fever, malaise/fatigue and weight loss.  HENT: Negative.  Negative for congestion, sore throat and tinnitus.   Eyes: Negative.  Negative for blurred vision, double vision and photophobia.  Respiratory: Negative.  Negative for cough and wheezing.   Cardiovascular: Positive for leg swelling. Negative for chest pain and palpitations.  Gastrointestinal: Negative.  Negative  for  blood in stool, diarrhea, nausea and vomiting.  Genitourinary: Negative.  Negative for dysuria, frequency and urgency.  Musculoskeletal: Negative.  Negative for joint pain and myalgias.  Skin: Negative.  Negative for itching and rash.  Neurological: Negative.  Negative for dizziness, focal weakness, weakness and headaches.  Endo/Heme/Allergies: Negative.  Negative for environmental allergies and polydipsia. Does not bruise/bleed easily.  Psychiatric/Behavioral: Negative.  Negative for depression and memory loss. The patient is not nervous/anxious and does not have insomnia.    Objective:  Blood pressure (!) 151/84, pulse 77, height 5\' 6"  (1.676 m), weight 257 lb 8 oz (116.8 kg). Body mass index is 41.56 kg/m. Gen:   Well NAD, A and O *3 HEENT:    [place]/AT, EOMI,  MMM, no JVD, no bruit Lungs:   Normal work of breathing. Distant but ECTA B/L, no Wh, rhonchi Heart:   Distant but RRR, S1, S2 WNL's, no MRG Exts:    warm and well perfused. Chronic venous stasis dermatitis b/l lext Psych:    No HI/SI, judgement and insight good, Euthymic mood. Full Affect.   Recent Results (from the past 2160 hour(s))  Lipid Profile     Status: Abnormal   Collection Time: 08/15/16  9:12 AM  Result Value Ref Range   Cholesterol 191 125 - 200 mg/dL   Triglycerides 811 (H) <150 mg/dL   HDL 54 >=91 mg/dL   Total CHOL/HDL Ratio 3.5 <=5.0 Ratio   VLDL 42 (H) <30 mg/dL   LDL Cholesterol 95 <478 mg/dL    Comment:   Total Cholesterol/HDL Ratio:CHD Risk                        Coronary Heart Disease Risk Table                                        Men       Women          1/2 Average Risk              3.4        3.3              Average Risk              5.0        4.4           2X Average Risk              9.6        7.1           3X Average Risk             23.4       11.0 Use the calculated Patient Ratio above and the CHD Risk table  to determine the patient's CHD Risk.   TSH     Status: None   Collection  Time: 08/15/16  9:12 AM  Result Value Ref Range   TSH 2.92 mIU/L    Comment:   Reference Range   > or = 20 Years  0.40-4.50   Pregnancy Range First trimester  0.26-2.66 Second trimester 0.55-2.73 Third trimester  0.43-2.91     COMPLETE METABOLIC PANEL WITH GFR     Status: Abnormal   Collection Time: 09/29/16 10:19 AM  Result Value Ref Range   Sodium 136 135 - 146 mmol/L  Potassium 5.3 3.5 - 5.3 mmol/L   Chloride 100 98 - 110 mmol/L   CO2 28 20 - 31 mmol/L   Glucose, Bld 101 (H) 65 - 99 mg/dL   BUN 10 7 - 25 mg/dL   Creat 0.980.79 1.190.50 - 1.470.99 mg/dL    Comment:   For patients > or = 70 years of age: The upper reference limit for Creatinine is approximately 13% higher for people identified as African-American.      Total Bilirubin 0.6 0.2 - 1.2 mg/dL   Alkaline Phosphatase 99 33 - 130 U/L   AST 62 (H) 10 - 35 U/L   ALT 48 (H) 6 - 29 U/L   Total Protein 6.3 6.1 - 8.1 g/dL   Albumin 3.8 3.6 - 5.1 g/dL   Calcium 9.1 8.6 - 82.910.4 mg/dL   GFR, Est African American 88 >=60 mL/min   GFR, Est Non African American 77 >=60 mL/min

## 2016-09-29 NOTE — Assessment & Plan Note (Signed)
Must cut back on wine.  Causing inc in TG's.    Counseling done

## 2016-09-29 NOTE — Assessment & Plan Note (Addendum)
>>  ASSESSMENT AND PLAN FOR MIXED HYPERLIPIDEMIA WRITTEN ON 09/29/2016  9:57 AM BY Nevelyn Mellott, DO  17% 10 yr risk.  I rec Chol meds but pt would like to try 4-103mo diet/ lifestyle changes/ exercise.  If in future, 10-yr risk is not < 7%- agrees to go on meds.   >>ASSESSMENT AND PLAN FOR HYPERTRIGLYCERIDEMIA WRITTEN ON 09/29/2016  4:46 PM BY Nalaysia Manganiello, DO  Must cut back on wine.  Causing inc in TG's.    Counseling done

## 2016-09-29 NOTE — Patient Instructions (Addendum)
Walk 2 times daily of at least 5 minutes, after 1 week increase to 10 minutes twice daily  You MUST quit ETOH.       Alcohol Use Disorder Alcohol use disorder is a mental disorder. It is not a one-time incident of heavy drinking. Alcohol use disorder is the excessive and uncontrollable use of alcohol over time that leads to problems with functioning in one or more areas of daily living. People with this disorder risk harming themselves and others when they drink to excess. Alcohol use disorder also can cause other mental disorders, such as mood and anxiety disorders, and serious physical problems. People with alcohol use disorder often misuse other drugs.  Alcohol use disorder is common and widespread. Some people with this disorder drink alcohol to cope with or escape from negative life events. Others drink to relieve chronic pain or symptoms of mental illness. People with a family history of alcohol use disorder are at higher risk of losing control and using alcohol to excess.  Drinking too much alcohol can cause injury, accidents, and health problems. One drink can be too much when you are:  Working.  Pregnant or breastfeeding.  Taking medicines. Ask your doctor.  Driving or planning to drive. SYMPTOMS  Signs and symptoms of alcohol use disorder may include the following:   Consumption ofalcohol inlarger amounts or over a longer period of time than intended.  Multiple unsuccessful attempts to cutdown or control alcohol use.   A great deal of time spent obtaining alcohol, using alcohol, or recovering from the effects of alcohol (hangover).  A strong desire or urge to use alcohol (cravings).   Continued use of alcohol despite problems at work, school, or home because of alcohol use.   Continued use of alcohol despite problems in relationships because of alcohol use.  Continued use of alcohol in situations when it is physically hazardous, such as driving a car.  Continued  use of alcohol despite awareness of a physical or psychological problem that is likely related to alcohol use. Physical problems related to alcohol use can involve the brain, heart, liver, stomach, and intestines. Psychological problems related to alcohol use include intoxication, depression, anxiety, psychosis, delirium, and dementia.   The need for increased amounts of alcohol to achieve the same desired effect, or a decreased effect from the consumption of the same amount of alcohol (tolerance).  Withdrawal symptoms upon reducing or stopping alcohol use, or alcohol use to reduce or avoid withdrawal symptoms. Withdrawal symptoms include:  Racing heart.  Hand tremor.  Difficulty sleeping.  Nausea.  Vomiting.  Hallucinations.  Restlessness.  Seizures. DIAGNOSIS Alcohol use disorder is diagnosed through an assessment by your health care provider. Your health care provider may start by asking three or four questions to screen for excessive or problematic alcohol use. To confirm a diagnosis of alcohol use disorder, at least two symptoms must be present within a 36-month period. The severity of alcohol use disorder depends on the number of symptoms:  Mild--two or three.  Moderate--four or five.  Severe--six or more. Your health care provider may perform a physical exam or use results from lab tests to see if you have physical problems resulting from alcohol use. Your health care provider may refer you to a mental health professional for evaluation. TREATMENT  Some people with alcohol use disorder are able to reduce their alcohol use to low-risk levels. Some people with alcohol use disorder need to quit drinking alcohol. When necessary, mental health professionals with specialized  training in substance use treatment can help. Your health care provider can help you decide how severe your alcohol use disorder is and what type of treatment you need. The following forms of treatment are  available:   Detoxification. Detoxification involves the use of prescription medicines to prevent alcohol withdrawal symptoms in the first week after quitting. This is important for people with a history of symptoms of withdrawal and for heavy drinkers who are likely to have withdrawal symptoms. Alcohol withdrawal can be dangerous and, in severe cases, cause death. Detoxification is usually provided in a hospital or in-patient substance use treatment facility.  Counseling or talk therapy. Talk therapy is provided by substance use treatment counselors. It addresses the reasons people use alcohol and ways to keep them from drinking again. The goals of talk therapy are to help people with alcohol use disorder find healthy activities and ways to cope with life stress, to identify and avoid triggers for alcohol use, and to handle cravings, which can cause relapse.  Medicines.Different medicines can help treat alcohol use disorder through the following actions:  Decrease alcohol cravings.  Decrease the positive reward response felt from alcohol use.  Produce an uncomfortable physical reaction when alcohol is used (aversion therapy).  Support groups. Support groups are run by people who have quit drinking. They provide emotional support, advice, and guidance. These forms of treatment are often combined. Some people with alcohol use disorder benefit from intensive combination treatment provided by specialized substance use treatment centers. Both inpatient and outpatient treatment programs are available.   This information is not intended to replace advice given to you by your health care provider. Make sure you discuss any questions you have with your health care provider.   Document Released: 12/21/2004 Document Revised: 12/04/2014 Document Reviewed: 02/20/2013 Elsevier Interactive Patient Education [employer]       For those diagnosed with high triglycerides, it's important to take  action to lower your levels and improve your heart health.  Triglyceride is just a fancy word for fat - the fat in our bodies is stored in the form of triglycerides. Triglycerides are found in foods and manufactured in our bodies.  Normal triglyceride levels are defined as less than 150 mg/dL; 119 to 147 is considered borderline high; 200 to 499 is high; and 500 or higher is officially called very high. To me, anything over 150 is a red flag indicating my patient needs to take immediate steps to get the situation under control.   What is the significance of high triglycerides? High triglyceride levels make blood thicker and stickier, which means that it is more likely to form clots. Studies have shown that triglyceride levels are associated with increased risks of cardiovascular disease and stroke - in both men and women - alone or in combination with other risk factors (high triglycerides combined with high LDL cholesterol can be a particularly deadly combination). For example, in one ground-breaking study, high triglycerides alone increased the risk of cardiovascular disease by 14 percent in men, and by 37 percent in women. But when the test subjects also had low HDL cholesterol (that's the good cholesterol) and other risk factors, high triglycerides increased the risk of disease by 32 percent in men and 76 percent in women.   Fortunately, triglycerides can sometimes be controlled with several diet and lifestyle changes.    What Factors Can Increase Triglycerides? As with cholesterol, eating too much of the wrong kinds of fats will raise your blood triglycerides.  Therefore, it's important to restrict the amounts of saturated fats and trans fats you allow into your diet.  Triglyceride levels can also shoot up after eating foods that are high in carbohydrates or after drinking alcohol.  That's why triglyceride blood tests require an overnight fast.  If you have elevated triglycerides, it's especially  important to avoid sugary and refined carbohydrates, including sugar, honey, and other sweeteners, soda and other sugary drinks, candy, baked goods, and anything made with white (refined or enriched) flour, including white bread, rolls, cereals, buns, pastries, regular pasta, and white rice.  You'll also want to limit dried fruit and fruit juice since they're dense in simple sugar.  All of these low-quality carbs cause a sudden rise in insulin, which may lead to a spike in triglycerides.  Triglycerides can also become elevated as a reaction to having diabetes, hypothyroidism, or kidney disease. As with most other heart-related factors, being overweight and inactive also contribute to abnormal triglycerides. And unfortunately, some people have a genetic predisposition that causes them to manufacture way too much triglycerides on their own, no matter how carefully they eat.   How Can You Lower Your Triglyceride Levels? If you are diagnosed with high triglycerides, it's important to take action. There are several things you can do to help lower your triglyceride levels and improve your heart health:  Lose weight if you are overweight.  There is a clear correlation between obesity and high triglycerides - the heavier people are, the higher their triglyceride levels are likely to be. The good news is that losing weight can significantly lower triglycerides. In a large study of individuals with type 2 diabetes, those assigned to the "lifestyle intervention group" - which involved counseling, a low-calorie meal plan, and customized exercise program - lost 8.6% of their body weight and lowered their triglyceride levels by more than 16%. If you're overweight, find a weight loss plan that works for you and commit to shedding the pounds and getting healthier.  Reduce the amount of saturated fat and trans fat in your diet.  Start by avoiding or dramatically limiting butter, cream cheese, lard, sour cream, doughnuts,  cakes, cookies, candy bars, regular ice cream, fried foods, pizza, cheese sauce, cream-based sauces and salad dressings, high-fat meats (including fatty hamburgers, bologna, pepperoni, sausage, bacon, salami, pastrami, spareribs, and hot dogs), high-fat cuts of beef and pork, and whole-milk dairy products.   Other ways to cut back: Choose lean meats only (including skinless chicken and [place], lean beef, lean pork), fish, and reduced-fat or fat-free dairy products.   Experiment with adding whole soy foods to your diet. Although soy itself may not reduce risk of heart disease, it replaces hazardous animal fats with healthier proteins. Choose high-quality soy foods, such as tofu, tempeh, soy milk, and edamame (whole soybeans).  Always remove skin from poultry.  Prepare foods by baking, roasting, broiling, boiling, poaching, steaming, grilling, or stir-frying in vegetable oil.  Most stick margarines contain trans fats, and trans fats are also found in some packaged baked goods, potato chips, snack foods, fried foods, and fast food that use or create hydrogenated oils.    (All food labels must now list the amount of trans fats, right after the amount of saturated fats - good news for consumers. As a result, many food companies have now reformulated their products to be trans fat free.many, but not all! So it's still just as important to read labels and make sure the packaged foods you buy don't contain trans  fats.)     If you use margarine, purchase soft-tub margarine spreads that contain 0 grams trans fats and don't list any partially hydrogenated oils in the ingredients list. By substituting olive oil or vegetable oil for trans fats in just 2 percent of your daily calories, you can reduce your risk of heart disease by 53 percent.   There is no safe amount of trans fats, so try to keep them as far from your plate as possible.  Avoid foods that are concentrated in sugar (even dried fruit and fruit juice).  Sugary foods can elevate triglyceride levels in the blood, so keep them to a bare minimum.  Swap out refined carbohydrates for whole grains.  Refined carbohydrates - like white rice, regular pasta, and anything made with white or "enriched" flour (including white bread, rolls, cereals, buns, and crackers) - raise blood sugar and insulin levels more than fiber-rich whole grains. Higher insulin levels, in turn, can lead to a higher rise in triglycerides after a meal. So, make the switch to whole wheat bread, whole grain pasta, brown or wild rice, and whole grain versions of cereals, crackers, and other bread products. However, it's important to know that individuals with high triglycerides should moderate even their intake of high-quality starches (since all starches raise blood sugar) - I recommend 1 to 2 servings per meal.  Cut way back on alcohol.  If you have high triglycerides, alcohol should be considered a rare treat - if you indulge at all, since even small amounts of alcohol can dramatically increase triglyceride levels.  Incorporate omega-3 fats.  Heart-healthy fish oils are especially rich in omega-3 fatty acids. In multiple studies over the past two decades, people who ate diets high in omega-3s had 30 to 40 percent reductions in heart disease. Although we don't yet know why fish oil works so well, there are several possibilities. Omega-3s seem to reduce inflammation, reduce high blood pressure, decrease triglycerides, raise HDL cholesterol, and make blood thinner and less sticky so it is less likely to clot. It's as close to a food prescription for heart health as it gets. If you have high triglycerides, I recommend eating at least three servings of one of the omega-3-rich fish every week (fatty fish is the most concentrated food form of omega three fats). If you cannot manage to eat that much fish, speak with your physician about taking fish oil capsules, which offer similar benefits.The best  foods for omega-3 fatty acids include wild salmon (fresh, canned), herring, mackerel (not king), sardines, anchovies, rainbow trout, and Pacific oysters. Non-fish sources of omega-3 fats include omega-3-fortified eggs, ground flaxseed, chia seeds, walnuts, butternuts (white walnuts), seaweed, walnut oil, canola oil, and soybeans.  Quit smoking.  Smoking causes inflammation, not just in your lungs, but throughout your body. Inflammation can contribute to atherosclerosis, blood clots, and risk of heart attack. Smoking makes all heart health indicators worse. If you have high cholesterol, high triglycerides, or high blood pressure, smoking magnifies the danger.  Become more physically active.  Even moderate exercise can help improve cholesterol, triglycerides, and blood pressure. Aerobic exercise seems to be able to stop the sharp rise of triglycerides after eating, perhaps because of a decrease in the amount of triglyceride released by the liver, or because active muscle clears triglycerides out of the blood stream more quickly than inactive muscle. If you haven't exercised regularly (or at all) for years, I recommend starting slowly, by walking at an easy pace for 15 minutes a day.  Then, as you feel more comfortable, increase the amount. Your ultimate goal should be at least 30 minutes of moderate physical activity, at least five days a week.      Guidelines for a Low Cholesterol, Low Saturated Fat Diet   Fats - Limit total intake of fats and oils. - Avoid butter, stick margarine, shortening, lard, palm and coconut oils. - Limit mayonnaise, salad dressings, gravies and sauces, unless they are homemade with low-fat ingredients. - Limit chocolate. - Choose low-fat and nonfat products, such as low-fat mayonnaise, low-fat or non-hydrogenated peanut butter, low-fat or fat-free salad dressings and nonfat gravy. - Use vegetable oil, such as canola or olive oil. - Look for margarine that does not  contain trans fatty acids. - Use nuts in moderate amounts. - Read ingredient labels carefully to determine both amount and type of fat present in foods. Limit saturated and trans fats! - Avoid high-fat processed and convenience foods.  Meats and Meat Alternatives - Choose fish, chicken, [place] and lean meats. - Use dried beans, peas, lentils and tofu. - Limit egg yolks to three to four per week. - If you eat red meat, limit to no more than three servings per week and choose loin or round cuts. - Avoid fatty meats, such as bacon, sausage, franks, luncheon meats and ribs. - Avoid all organ meats, including liver.  Dairy - Choose nonfat or low-fat milk, yogurt and cottage cheese. - Most cheeses are high in fat. Choose cheeses made from non-fat milk, such as mozzarella and ricotta cheese. - Choose light or fat-free cream cheese and sour cream. - Avoid cream and sauces made with cream.  Fruits and Vegetables - Eat a wide variety of fruits and vegetables. - Use lemon juice, vinegar or "mist" olive oil on vegetables. - Avoid adding sauces, fat or oil to vegetables.  Breads, Cereals and Grains - Choose whole-grain breads, cereals, pastas and rice. - Avoid high-fat snack foods, such as granola, cookies, pies, pastries, doughnuts and croissants.  Cooking Tips - Avoid deep fried foods. - Trim visible fat off meats and remove skin from poultry before cooking. - Bake, broil, boil, poach or roast poultry, fish and lean meats. - Drain and discard fat that drains out of meat as you cook it. - Add little or no fat to foods. - Use vegetable oil sprays to grease pans for cooking or baking. - Steam vegetables. - Use herbs or no-oil marinades to flavor foods.

## 2016-09-29 NOTE — Assessment & Plan Note (Signed)
Prozac started  counseling done and I rec CBT with a professional counselor

## 2016-09-29 NOTE — Assessment & Plan Note (Signed)
>>  ASSESSMENT AND PLAN FOR ADJUSTMENT DISORDER WITH MIXED ANXIETY AND DEPRESSED MOOD WRITTEN ON 09/29/2016  4:42 PM BY OPALSKI, DEBORAH, DO  Prozac started  counseling done and I rec CBT with a professional counselor

## 2016-09-30 LAB — COMPLETE METABOLIC PANEL WITH GFR
ALT: 48 U/L — ABNORMAL HIGH (ref 6–29)
AST: 62 U/L — ABNORMAL HIGH (ref 10–35)
Albumin: 3.8 g/dL (ref 3.6–5.1)
Alkaline Phosphatase: 99 U/L (ref 33–130)
BUN: 10 mg/dL (ref 7–25)
CO2: 28 mmol/L (ref 20–31)
Calcium: 9.1 mg/dL (ref 8.6–10.4)
Chloride: 100 mmol/L (ref 98–110)
Creat: 0.79 mg/dL (ref 0.50–0.99)
GFR, Est African American: 88 mL/min (ref >=60)
GFR, Est Non African American: 77 mL/min (ref >=60)
Glucose, Bld: 101 mg/dL — ABNORMAL HIGH (ref 65–99)
Potassium: 5.3 mmol/L (ref 3.5–5.3)
Sodium: 136 mmol/L (ref 135–146)
Total Bilirubin: 0.6 mg/dL (ref 0.2–1.2)
Total Protein: 6.3 g/dL (ref 6.1–8.1)

## 2016-09-30 NOTE — Assessment & Plan Note (Signed)
On lotensin and and still not at goal.    Start coreg.   Risks/ benefits meds d/c pt and pt will read drug information to further educate self about drug prior to starting it.  Contact us prior with any Q's/ concerns.

## 2016-10-25 ENCOUNTER — Other Ambulatory Visit (INDEPENDENT_AMBULATORY_CARE_PROVIDER_SITE_OTHER): Payer: Medicare Other

## 2016-10-25 ENCOUNTER — Other Ambulatory Visit: Payer: Self-pay

## 2016-10-25 DIAGNOSIS — R748 Abnormal levels of other serum enzymes: Secondary | ICD-10-CM

## 2016-10-25 NOTE — Progress Notes (Signed)
lft

## 2016-10-26 LAB — HEPATIC FUNCTION PANEL
ALT: 23 U/L (ref 6–29)
AST: 28 U/L (ref 10–35)
Albumin: 3.5 g/dL — ABNORMAL LOW (ref 3.6–5.1)
Alkaline Phosphatase: 90 U/L (ref 33–130)
Bilirubin, Direct: 0.1 mg/dL (ref <=0.2)
Indirect Bilirubin: 0.4 mg/dL (ref 0.2–1.2)
Total Bilirubin: 0.5 mg/dL (ref 0.2–1.2)
Total Protein: 5.9 g/dL — ABNORMAL LOW (ref 6.1–8.1)

## 2016-11-02 ENCOUNTER — Encounter: Payer: Self-pay | Admitting: Family Medicine

## 2016-11-02 ENCOUNTER — Ambulatory Visit (INDEPENDENT_AMBULATORY_CARE_PROVIDER_SITE_OTHER): Payer: Medicare Other | Admitting: Family Medicine

## 2016-11-02 ENCOUNTER — Ambulatory Visit: Payer: Medicare Other

## 2016-11-02 VITALS — BP 125/77 | HR 81 | Wt 246.5 lb

## 2016-11-02 DIAGNOSIS — M25562 Pain in left knee: Secondary | ICD-10-CM | POA: Diagnosis not present

## 2016-11-02 DIAGNOSIS — M179 Osteoarthritis of knee, unspecified: Secondary | ICD-10-CM | POA: Diagnosis not present

## 2016-11-02 DIAGNOSIS — M1712 Unilateral primary osteoarthritis, left knee: Secondary | ICD-10-CM

## 2016-11-02 DIAGNOSIS — M170 Bilateral primary osteoarthritis of knee: Secondary | ICD-10-CM | POA: Insufficient documentation

## 2016-11-02 IMAGING — DX DG KNEE COMPLETE 4+V*L*
3 series · 3 of 3 positions shown · non-contrast
Comparison: None.

CLINICAL DATA: Left knee pain.  Primary osteoarthritis

EXAM:
LEFT KNEE - COMPLETE 4+ VIEW

[knee standing ap]
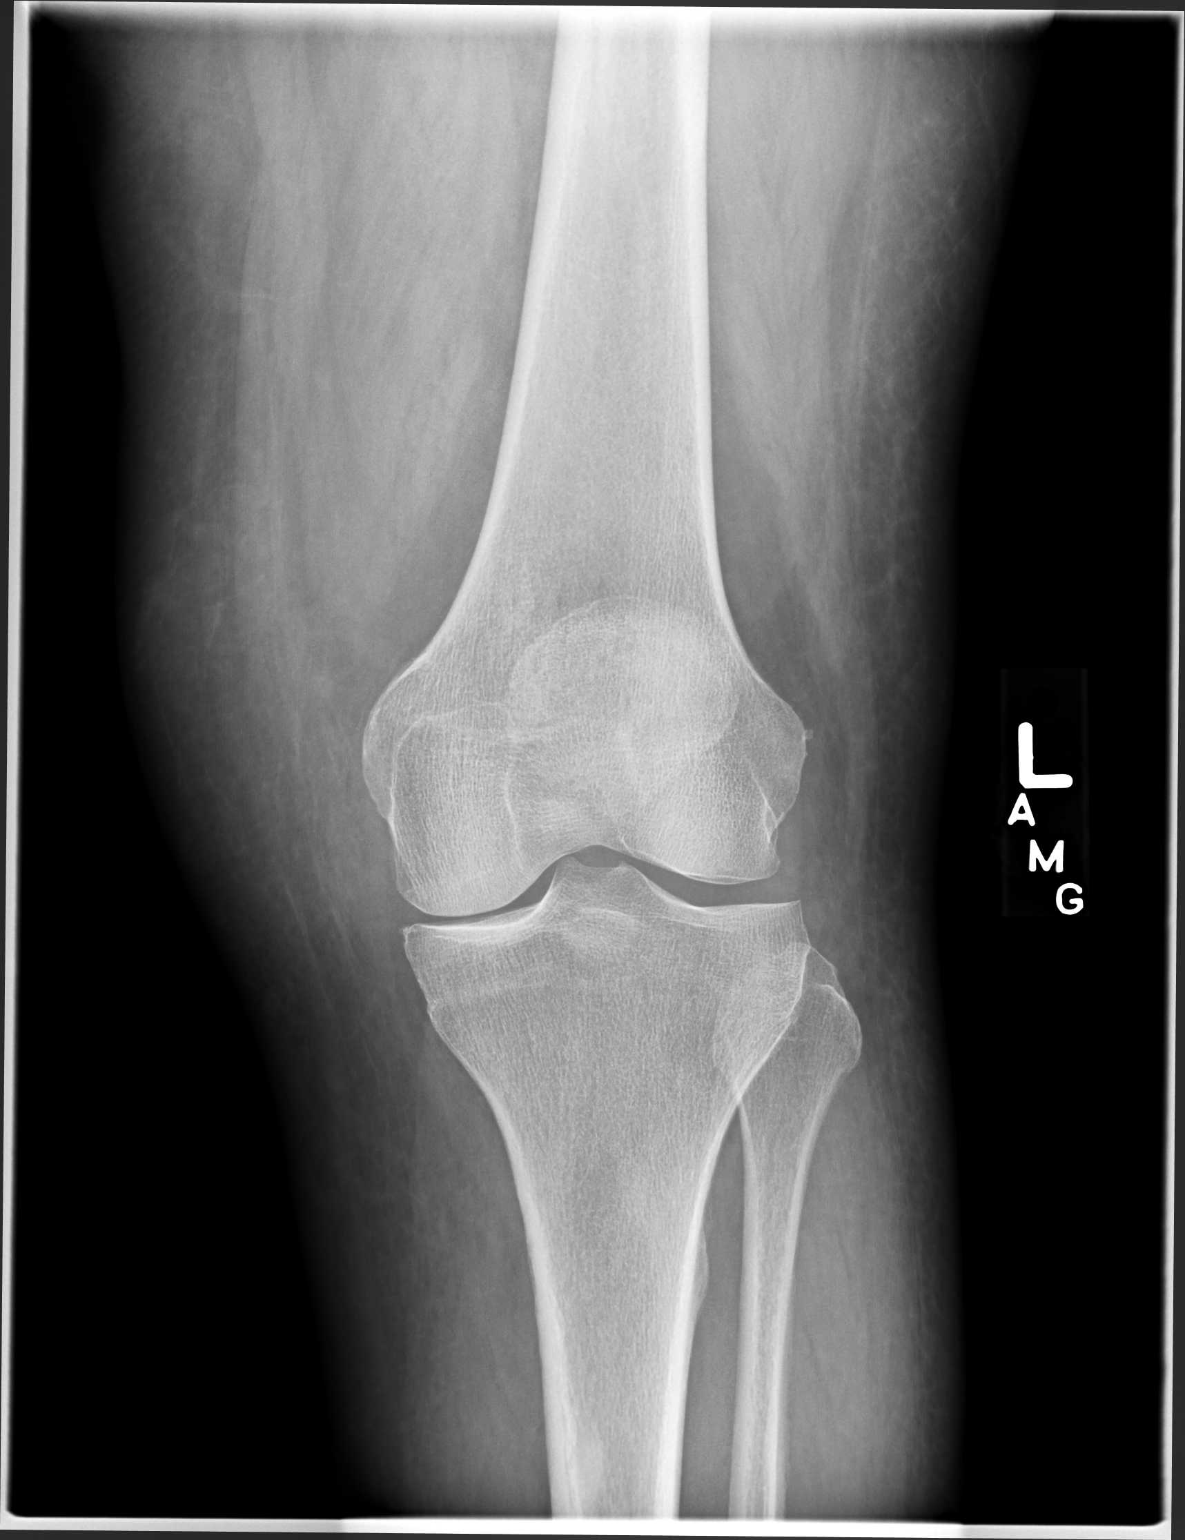

[knee standing lat]
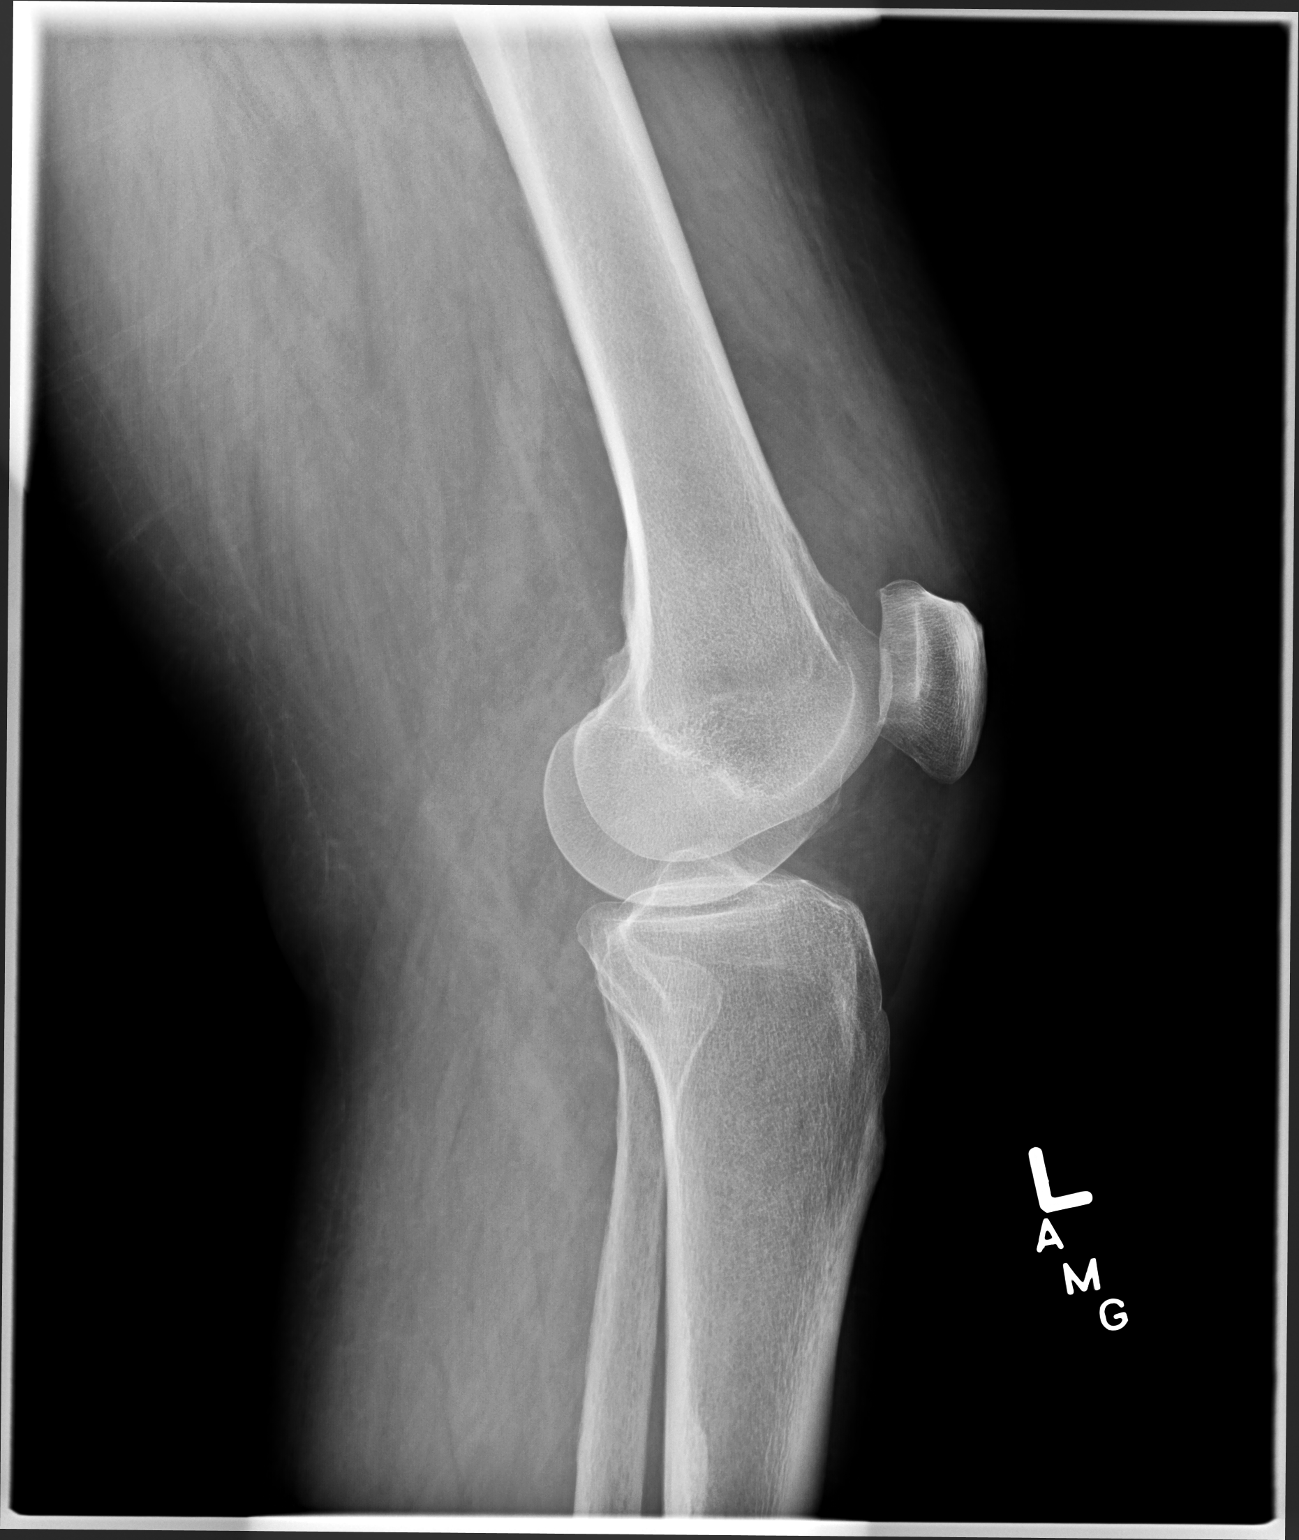

[sunrise]
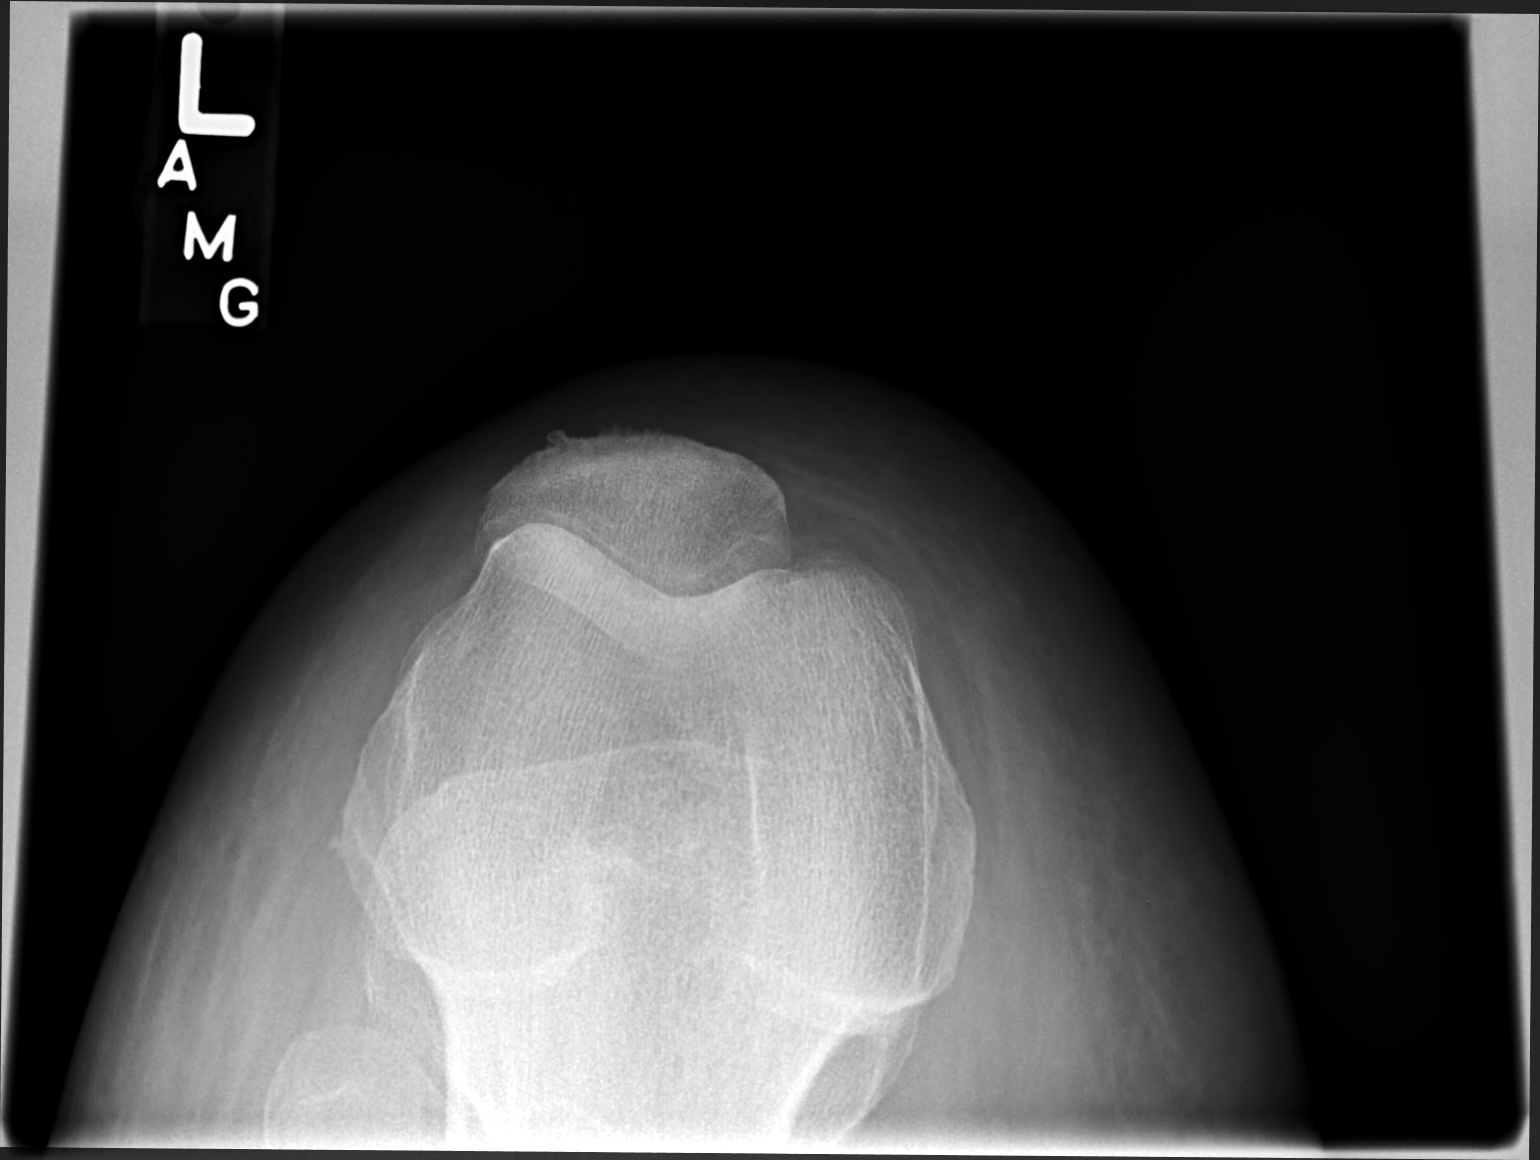

[3 of 3 positions shown; findings below may reference images not displayed]

FINDINGS: Mild joint space narrowing in the medial compartment. Early spurring
in the medial and patellofemoral compartments. No acute bony
abnormality. Specifically, no fracture, subluxation, or dislocation.
Soft tissues are intact. No joint effusion
IMPRESSION: Early osteoarthritis.  No acute bony abnormality.

## 2016-11-02 MED ORDER — TRAMADOL HCL 50 MG PO TABS: 50.0000 mg | ORAL_TABLET | Freq: Four times a day (QID) | ORAL | 0 refills | Status: DC | PRN

## 2016-11-02 NOTE — Patient Instructions (Signed)

## 2016-11-02 NOTE — Progress Notes (Unsigned)
Knee Pain

## 2016-11-02 NOTE — Progress Notes (Signed)
Impression and Recommendations:    The patient was counselled, risk factors were discussed, anticipatory guidance given.   1. Primary osteoarthritis of left knee   2. Left knee pain, unspecified chronicity    KNEE PAIN prior to procedure: 8/10 pain level - xrays: L Knee-  Showed mild medial jt space narrowing and mild osteoarthriticchanges articular surface  - informed consent obtained.   Procedure:   -Injection of L knee -Consent obtained after discussion of risks benefits and alternatives of procedure; patient wished to proceed. -Noted no overlying erythema, warmth, induration, or other signs of local infection. -Skin prepped in a sterile fashion. -Topical analgesic spray: Ethyl chloride/ freez ease After needle inserted less than 1/3inch into numbed skin, pt endorsed pain and was not able to sit still.  I withdrew due to pt's intolerance to procedure.  Pt asked me to try again and endorsed that ortho docs in the past had to inject several times, with mod-to-severe pain.   I declined and set her up to US guided procedure with sprts med.  EBL: Less than 0.1 mL Meds: 3cc 2% lidocaine, 1 mL Depo-Medrol 40 mg per mL--> but never injected Injection site dressed with sterile bandage. - Ice 15-20 minutes 3-4 times per day for the next 48 hours and any time after repetitive activity thereafter - Advised to call if fevers/chills, erythema, induration, drainage, or bleeding.   Follow-up as discussed  - patient declined P.T. referral. Discussed the risks /benefits of this with patient and importance of strengthening muscles around the knee area regardless of pathology. - Pain meds when necessary; risk benefits discussed with patient. - ICE for 15-20 minutes, 3-4 times per day. - pt understands may need MRI if knee does not improve in future  - Pt will make follow-up appointment for complete physical exam with fasting blood work in the near future at [facility].   Orders Placed This Encounter  Procedures  . DG Knee Complete 4 Views Left    Standing Status:   Future    Number of Occurrences:   1    Standing Expiration Date:   12/03/2016    Order Specific Question:   Reason for Exam (SYMPTOM  OR DIAGNOSIS REQUIRED)    Answer:   pain , swelling    Order Specific Question:   Preferred imaging location?    Answer:   [facility]    New Prescriptions   TRAMADOL (ULTRAM) 50 MG TABLET    Take 1-2 tablets (50-100 mg total) by mouth every 6 (six) hours as needed.   Modified Medications   Modified Medication Previous Medication   BENAZEPRIL-HYDROCHLORTHIAZIDE (LOTENSIN HCT) 20-25 MG TABLET benazepril-hydrochlorthiazide (LOTENSIN HCT) 20-25 MG tablet      Take 1 tablet by mouth daily.    Take 1 tablet by mouth daily.    Return if symptoms worsen or fail to improve, for Follow-up of current medical issues.  Please see AVS handed out to patient at the end of our visit for further patient instructions/ counseling done pertaining to today's office visit.  Note: This document was prepared using Dragon voice recognition software and may include unintentional dictation errors.    Subjective:  CC;  Knee pain.       Bilateral knee pain, left greater than right, fall approximately 3 weeks ago, swelling in knee and calf of left leg   [name] is a 70 y.o. female who sustained a bilateral knee injury, L >  er R more recently.  For many year(s) she has had knee pain now.  Had injections in past by Ortho Doc in [place]- ? Name; none for many months. pt endorses they had a hard time getting them in the right spot and it "hurt like hell".   No MOI.   Symptoms have been gradually worsening and chronic/ waxing/ waning many yrs now.    Worse since retiring ( not being as active)  and putting on wt.       Patient Care Team    Relationship Specialty Notifications Start End  Thomasene Loteborah Yenny Kosa, DO PCP - General Family Medicine  06/14/16   Monica Bectonhomas  J Thekkekandam, MD Consulting Physician Family Medicine  11/08/16    Comment: OA- knee injections; PT    Active Clinical Problem List Patient Active Problem List   Diagnosis Date Noted  . Hypertriglyceridemia 09/29/2016    Priority: High  . HLD (hyperlipidemia) 09/29/2016    Priority: High  . Adjustment disorder with mixed anxiety and depressed mood 09/29/2016    Priority: High  . Anastomotic ulcer S/P gastric bypass 06/18/2016    Priority: High  . Morbid obesity (HCC) 06/18/2016    Priority: High  . R Congenital Kidney ABN: Ess only has one fxn kidney 06/18/2016    Priority: High  . H/O gastric bypass 06/18/2016    Priority: High  . HTN (hypertension) 04/25/2015    Priority: High  . Alcohol abuse 04/25/2015    Priority: High  . Peripheral edema- b/l L Ext w chronic skin changes 06/18/2016    Priority: Medium  . GAD (generalized anxiety disorder) 06/14/2016    Priority: Medium  . UGIB (upper gastrointestinal bleed) 04/25/2015    Priority: Medium  . Acute ulcer at gastrojejunal region     Priority: Medium  . Vitamin D deficiency 09/29/2016    Priority: Low  . h/o Iron deficiency anemia 06/14/2016    Priority: Low  . Hypothyroidism 02/13/2016    Priority: Low  . Bilateral lower extremity edema 11/07/2016  . Primary osteoarthritis of both knees 11/02/2016  . History of hysterectomy 06/18/2016  . Insomnia due to alcohol excess and stress 06/18/2016  . Myalgia and myositis 06/14/2016  . Stress at home 02/13/2016  . Distal radius fracture, right 01/15/2013   Past medical history:   Reviewed.  See flowsheet/record as well for more information.  Surgical history:    Reviewed.    See flowsheet/record as well for more information.  Family history:   Reviewed.  See flowsheet/record as well for more information.  Social history:    Reviewed. See flowsheet/record as well for more information.  Allergies, and medications:   Have been entered into the medical record, reviewed,   and changes made as directed by patient.   Review of Systems: No headache, visual changes, nausea, vomiting, diarrhea, constipation, dizziness, abdominal pain, skin rash, fevers, chills, night sweats, weight loss, swollen lymph nodes, body aches, joint swelling, muscle aches, chest pain, shortness of breath, mood changes, visual or auditory hallucinations.   Objective:   Blood pressure 125/77, pulse 81, weight 246 lb 8 oz (111.8 kg). Body mass index is 39.79 kg/m.  General: Well Developed, well nourished, and in no acute distress.  Neuro: Alert and oriented x3, extra-ocular muscles intact, sensation grossly intact.  HEENT: Normocephalic, atraumatic, pupils equal round reactive to light, neck supple Skin: no gross suspicious lesions or rashes  Cardiac: Regular rate and rhythm, no murmurs rubs or gallops.  Respiratory: Essentially clear  to auscultation bilaterally. Not using accessory muscles, speaking in full sentences.  Abdominal: Soft, not grossly distended Musculoskeletal: Ambulates w/o diff, FROM * 4 ext.  L Knee:  peripatellar swelling L>er R o/w essential normal exam; + joint line TTP medially and laterally.  no gross instability to posterior or anterior drawer test; negative varus stress test; minimal discomfort on valgus stress test. FROM with very mild crepitus, mild soft tissue swelling posterior knee, no ecchymosis, normal contralateral knee exam. Neurovascularly intact distally. No pain/abn upon quick palpation of the ipsilateral hip and ankle joints. Vasc: less 2 sec cap RF, warm and pink  Psych: No HI/SI, judgement and insight good, Euthymic mood. Full Affect.    Review of Systems  Musculoskeletal: Positive for joint pain.       Bilateral knee pain, left greater than right, fall approximately 3 weeks ago, swelling in knee and calf of left leg

## 2016-11-03 ENCOUNTER — Telehealth: Payer: Self-pay

## 2016-11-03 NOTE — Telephone Encounter (Signed)
Pt called and left a message stating Tramadol is not working. Called pt no answer and unable to leave a voice message.

## 2016-11-06 ENCOUNTER — Telehealth: Payer: Self-pay

## 2016-11-06 NOTE — Telephone Encounter (Signed)
Pt called back stating that she is taking tramadol with advil and Tylenol between doses of tramadol and is tolerating the pain.  Tiajuana Amass. Nelson, CMA

## 2016-11-06 NOTE — Telephone Encounter (Signed)
LVM for pt to call with update.  [name]. Nelson, CMA

## 2016-11-06 NOTE — Telephone Encounter (Signed)
-----   Message from [name], DO sent at 11/06/2016 12:58 PM EST ----- Regarding: please call pt Can you please call [name] and see how her knee is doing? Thanks for lettign me know.   I see she has appt with [name] tomorrow.

## 2016-11-07 ENCOUNTER — Ambulatory Visit (INDEPENDENT_AMBULATORY_CARE_PROVIDER_SITE_OTHER): Payer: Medicare Other | Admitting: Sports Medicine

## 2016-11-07 ENCOUNTER — Encounter: Payer: Self-pay | Admitting: Sports Medicine

## 2016-11-07 DIAGNOSIS — R600 Localized edema: Secondary | ICD-10-CM | POA: Diagnosis not present

## 2016-11-07 DIAGNOSIS — M170 Bilateral primary osteoarthritis of knee: Secondary | ICD-10-CM | POA: Diagnosis not present

## 2016-11-07 NOTE — Progress Notes (Signed)
Subjective:    I'm seeing this patient as a consultation for:  Dr. [name].  CC: Bilateral knee pain  HPI:  This is a pleasant 70 year old female with known knee osteoarthritis, she's failed ibuprofen and tramadol.  She hasn't had an injection for a long time. Pain is moderate, persistent, localized to the joint lines without radiation, no mechanical symptoms, minimal grind, severe swelling.  She also endorses significant swelling of her legs distal to the knees.  Past medical history:  Negative.  See flowsheet/record as well for more information.  Surgical history: Negative.  See flowsheet/record as well for more information.  Family history: Negative.  See flowsheet/record as well for more information.  Social history: Negative.  See flowsheet/record as well for more information.  Allergies, and medications have been entered into the medical record, reviewed, and no changes needed.   Review of Systems: No headache, visual changes, nausea, vomiting, diarrhea, constipation, dizziness, abdominal pain, skin rash, fevers, chills, night sweats, weight loss, swollen lymph nodes, body aches, joint swelling, muscle aches, chest pain, shortness of breath, mood changes, visual or auditory hallucinations.   Objective:   General: Well Developed, well nourished, and in no acute distress.  Neuro/Psych: Alert and oriented x3, extra-ocular muscles intact, able to move all 4 extremities, sensation grossly intact. Skin: Warm and dry, no rashes noted.  Respiratory: Not using accessory muscles, speaking in full sentences, trachea midline.  Cardiovascular: Pulses palpable, no extremity edema. Abdomen: Does not appear distended. Bilateral knees: Visible swelling, left worse than right with a palpable fluid wave Tender to palpation at the medial joint lines in the patellar facets ROM normal in flexion and extension and lower leg rotation. Ligaments with solid consistent endpoints including ACL,  PCL, LCL, MCL. Negative Mcmurray's and provocative meniscal tests. Non painful patellar compression. Patellar and quadriceps tendons unremarkable. Hamstring and quadriceps strength is normal. 2+ bilateral symmetric lower extremity pitting edema with a negative Homans sign bilaterally.  Procedure: Real-time Ultrasound Guided aspiration/injection of left knee  Device: GE Logiq E  Verbal informed consent obtained.  Time-out conducted.  Noted no overlying erythema, induration, or other signs of local infection.  Skin prepped in a sterile fashion.  Local anesthesia: Topical Ethyl chloride.  With sterile technique and under real time ultrasound guidance:  Using 18-gauge needle aspirated 20 mL straw-colored fluid, syringe switched and 1 mL kenalog 40, 2 mL lidocaine, 2 mL Marcaine injected easily. Completed without difficulty  Pain immediately resolved suggesting accurate placement of the medication.  Advised to call if fevers/chills, erythema, induration, drainage, or persistent bleeding.  Images permanently stored and available for review in the ultrasound unit.  Impression: Technically successful ultrasound guided injection.  Procedure: Real-time Ultrasound Guided aspiration/injection of right knee  Device: GE Logiq E  Verbal informed consent obtained.  Time-out conducted.  Noted no overlying erythema, induration, or other signs of local infection.  Skin prepped in a sterile fashion.  Local anesthesia: Topical Ethyl chloride.  With sterile technique and under real time ultrasound guidance:  Using 18-gauge needle aspirated 24 mL straw-colored fluid, syringe switched and 1 mL kenalog 40, 2 mL lidocaine, 2 mL Marcaine injected easily. Completed without difficulty  Pain immediately resolved suggesting accurate placement of the medication.  Advised to call if fevers/chills, erythema, induration, drainage, or persistent bleeding.  Images permanently stored and available for review in the  ultrasound unit.  Impression: Technically successful ultrasound guided injection.  Impression and Recommendations:   This case required medical decision making of moderate  complexity.  Primary osteoarthritis of both knees Left worse than right. Adding formal physical therapy. She does have renal dysfunction and a history of peptic ulcer disease so I would recommend that we avoid NSAIDs and simply use tramadol and acetaminophen for pain. Bilateral aspiration and injection as above.  Return to see me in one month. Certainly of this fails, viscosupplementation becomes an option.  Bilateral lower extremity edema I think this is most likely secondary to dependent edema from the knee osteoarthritis. Renal function was normal and liver function test was only slightly elevated (she does need a hepatic ultrasound with persistent mild transaminitis, that likely represents hepatic steatosis) It would be reasonable to get an echocardiogram to rule out any element of heart failure. Ultimately I have recommended that she wear her lower extremity compression hose every day and not just when she travels. I can take a look at this again at her follow-up visit.

## 2016-11-07 NOTE — Assessment & Plan Note (Addendum)
I think this is most likely secondary to dependent edema from the knee osteoarthritis. Renal function was normal and liver function test was only slightly elevated (she does need a hepatic ultrasound with persistent mild transaminitis, that likely represents hepatic steatosis) It would be reasonable to get an echocardiogram to rule out any element of heart failure. Ultimately I have recommended that she wear her lower extremity compression hose every day and not just when she travels. I can take a look at this again at her follow-up visit.

## 2016-11-07 NOTE — Assessment & Plan Note (Signed)
Left worse than right. Adding formal physical therapy. She does have renal dysfunction and a history of peptic ulcer disease so I would recommend that we avoid NSAIDs and simply use tramadol and acetaminophen for pain. Bilateral aspiration and injection as above.  Return to see me in one month. Certainly of this fails, viscosupplementation becomes an option.

## 2016-11-08 NOTE — Telephone Encounter (Signed)
Patient advised, per Dr Sharee Holsterpalski, to take with 600 mg of ibuprofen.

## 2016-11-28 ENCOUNTER — Other Ambulatory Visit: Payer: Self-pay | Admitting: Physician Assistant

## 2016-11-28 DIAGNOSIS — I10 Essential (primary) hypertension: Secondary | ICD-10-CM

## 2016-11-29 ENCOUNTER — Other Ambulatory Visit: Payer: Self-pay

## 2016-11-29 DIAGNOSIS — I10 Essential (primary) hypertension: Secondary | ICD-10-CM

## 2016-11-29 NOTE — Telephone Encounter (Signed)
We have not prescribed this medication for the patient.  Please approve if appropriate.  Tiajuana Amass. Deandra Gadson, CMA

## 2016-11-30 MED ORDER — BENAZEPRIL-HYDROCHLOROTHIAZIDE 20-25 MG PO TABS: 1.0000 | ORAL_TABLET | Freq: Every day | ORAL | 0 refills | Status: DC

## 2016-12-04 ENCOUNTER — Ambulatory Visit: Payer: Medicare Other | Attending: Sports Medicine

## 2016-12-04 DIAGNOSIS — M170 Bilateral primary osteoarthritis of knee: Secondary | ICD-10-CM | POA: Insufficient documentation

## 2016-12-04 DIAGNOSIS — M25562 Pain in left knee: Secondary | ICD-10-CM | POA: Insufficient documentation

## 2016-12-04 DIAGNOSIS — M25561 Pain in right knee: Secondary | ICD-10-CM | POA: Diagnosis present

## 2016-12-04 DIAGNOSIS — G8929 Other chronic pain: Secondary | ICD-10-CM | POA: Insufficient documentation

## 2016-12-04 DIAGNOSIS — M6281 Muscle weakness (generalized): Secondary | ICD-10-CM | POA: Insufficient documentation

## 2016-12-04 DIAGNOSIS — R600 Localized edema: Secondary | ICD-10-CM | POA: Diagnosis not present

## 2016-12-04 NOTE — Therapy (Signed)
[place], [place], [address] Phone: [phone]   Fax:  [phone]  Physical Therapy Evaluation  Patient Details  Name: [name] MRN: [id number] Date of Birth: August 12, 1946 Referring Provider: T. Dianah Field, MD  Encounter Date: 12/04/2016      PT End of Session - 12/04/16 1439    Visit Number 1   Number of Visits 6  if needed   Date for PT Re-Evaluation 01/08/17   Authorization Type Medicare   PT Start Time 0130   PT Stop Time 0217   PT Time Calculation (min) 47 min   Activity Tolerance Patient tolerated treatment well   Behavior During Therapy [facility] for tasks assessed/performed      Past Medical History:  Diagnosis Date  . Alcohol abuse 04/25/2015  . Allergy   . Anastomotic ulcer S/P gastric bypass 06/18/2016  . Arthritis   . Cataract   . Degenerative disc disease, cervical   . GAD (generalized anxiety disorder) 06/14/2016  . HTN (hypertension) 04/25/2015  . Hypertension   . Hypothyroidism   . 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 Substance abuse    Alcohol abuse 2016  . UGIB (upper gastrointestinal bleed) 04/25/2015    Past Surgical History:  Procedure Laterality Date  . CHOLECYSTECTOMY    . COSMETIC SURGERY    . DIAGNOSTIC LAPAROSCOPY    . ESOPHAGOGASTRODUODENOSCOPY N/A 04/25/2015   Procedure: ESOPHAGOGASTRODUODENOSCOPY (EGD);  Surgeon: Jerene Bears, MD;  Location: [facility] ENDOSCOPY;  Service: Endoscopy;  Laterality: N/A;  . FRACTURE SURGERY    . GASTRIC BYPASS    . OPEN REDUCTION INTERNAL FIXATION (ORIF) DISTAL RADIAL FRACTURE Right 01/15/2013   Procedure: OPEN REDUCTION INTERNAL FIXATION (ORIF) Right DISTAL RADIUS FRACTURE;  Surgeon: Marybelle Killings, MD;  Location: [place];  Service: Orthopedics;  Laterality: Right;    There were no vitals filed for this visit.       Subjective Assessment - 12/04/16 1337    Subjective She reports  bilateral OA in knees and was referred to PT.   She has not used cane since after aspiration and cortisone injection  each knee.  She does not exercises since stopping walking lst fall or summer. Knee pain for past year pain worse but has had long history of knee pain.    Pertinent History RT forearm fracture   Limitations Walking;House hold activities  Sqatting and stooping.    How long can you sit comfortably? As needed   How long can you stand comfortably? 2 hours   How long can you walk comfortably? 2 blocks    Patient Stated Goals She wants to have knees feel better. Improve strength.    Currently in Pain? No/denies  last pain  yesterday after walking 3 blocks and was sore   Pain Location Knee   Pain Orientation Right;Left  medial and joint line RT /LT   Pain Descriptors / Indicators Sore   Pain Type Chronic pain   Pain Onset More than a month ago   Pain Frequency Intermittent   Aggravating Factors  walking ,squatting , stairs   Pain Relieving Factors Medication 2x/day when pain increased   Multiple Pain Sites No            [facility] PT Assessment - 12/04/16 0001      Assessment   Medical Diagnosis Bilateal Knee pain with edema   Referring Provider T. Dianah Field, MD   Onset Date/Surgical  Date --  years ago   Next MD Visit 12/05/16   Prior Therapy No     Precautions   Precaution Comments squatting     Restrictions   Weight Bearing Restrictions No     Balance Screen   Has the patient fallen in the past 6 months Yes   How many times? 1  LOB when she turned too fast   Has the patient had a decrease in activity level because of a fear of falling?  No   Is the patient reluctant to leave their home because of a fear of falling?  No     Prior Function   Level of Independence Independent  limits carrying     Cognition   Overall Cognitive Status Within Functional Limits for tasks assessed     ROM / Strength   AROM / PROM / Strength AROM;Strength     AROM   AROM  Assessment Site Knee   Right/Left Knee Right;Left   Right Knee Extension -5   Right Knee Flexion 112   Left Knee Extension -5   Left Knee Flexion 112     Strength   Strength Assessment Site Knee   Right/Left Knee Right;Left   Right Knee Flexion 5/5   Right Knee Extension 5/5   Left Knee Flexion 5/5   Left Knee Extension 5/5     Flexibility   Soft Tissue Assessment /Muscle Length yes   Hamstrings 60 degrees bilateal     Palpation   Palpation comment Tender medial knee LT > RT     Ambulation/Gait   Gait velocity 2.5 feet per second     Hip strength 4+/5 to 5/5 bilaterally. Mild edema.  [facility] Adult PT Treatment/Exercise - Dec 10, 2016 0001      Exercises   Exercises Knee/Hip     Knee/Hip Exercises: Supine   Bridges Both;10 reps                PT Education - 2016-12-10 1438    Education provided Yes   Education Details POC  <HEP   Person(s) Educated Patient   Methods Explanation;Verbal cues;Handout   Comprehension Returned demonstration;Verbalized understanding          PT Short Term Goals - 12/10/2016 1450      PT SHORT TERM GOAL #1   Title She will be independent with inital HEP   Time 3   Period Weeks   Status New           PT Long Term Goals - 2016/12/10 1447      PT LONG TERM GOAL #1   Title She will be independent in all HEP issued   Time 6   Status New     PT LONG TERM GOAL #2   Title She will report improved ability to walk steps and to do short squat for home tasks.    Time 6    Period Weeks   Status New     PT LONG TERM GOAL #3   Title SHE WILL REPORT OVERALL IMPROVEMENT IN PAIN 75% WITH ACTIVITY IN AND OUT OF HOME   Time 6   Period Weeks   Status New               Plan - 12-10-2016 1440    Clinical Impression Statement Ms  Fait presents for low complexity eval with chronic bilateral knee pain. She reports no pain today but is tender bilateral knees medially.  She feels she needs HEP to max function.   She was slightly more sore after 3 min on nustep L3  RT knee. She was started on inital HEP and wanted 1x/week  so started with 4 weeks if needed   Rehab Potential Good   PT Frequency 1x / week   PT Duration 6 weeks  if needed   PT Treatment/Interventions Cryotherapy;Patient/family education;Manual techniques;Therapeutic exercise;Iontophoresis 85m/ml Dexamethasone;Taping   PT Next Visit Plan Reveiw and add to HEP, STW medial thigh and quads   PT Home Exercise Plan QS/SLR, SAQ,    Consulted and Agree with Plan of Care Patient      Patient will benefit from skilled therapeutic intervention in order to improve the following deficits and impairments:  Obesity, Pain, Decreased range of motion, Decreased activity tolerance  Visit Diagnosis: Bilateral lower extremity edema  Osteoarthritis of both knees, unspecified osteoarthritis type  Muscle weakness (generalized)  Chronic pain of both knees  Localized edema      G-Codes - 001/14/20181531    Functional Assessment Tool Used clinical judgement   Functional Limitation Mobility: Walking and moving around   Mobility: Walking and Moving Around Current Status (([id number] At least 40 percent but less than 60 percent impaired, limited or restricted   Mobility: Walking and Moving Around Goal Status [phone] At least 1 percent but less than 20 percent impaired, limited or restricted       Problem List Patient Active Problem List   Diagnosis Date Noted  . Bilateral lower extremity edema 11/07/2016  .  Primary osteoarthritis of both knees 11/02/2016  . Hypertriglyceridemia 09/29/2016  . HLD (hyperlipidemia) 09/29/2016  . Vitamin D deficiency 09/29/2016  . Adjustment disorder with mixed anxiety and depressed mood 09/29/2016  .  Anastomotic ulcer S/P gastric bypass 06/18/2016  . Morbid obesity ([place]) 06/18/2016  . R Congenital Kidney ABN: Ess only has one fxn kidney 06/18/2016  . History of hysterectomy 06/18/2016  . H/O gastric bypass 06/18/2016  . Peripheral edema- b/l L Ext w chronic skin changes 06/18/2016  . Insomnia due to alcohol excess and stress 06/18/2016  . GAD (generalized anxiety disorder) 06/14/2016  . h/o Iron deficiency anemia 06/14/2016  . Myalgia and myositis 06/14/2016  . Hypothyroidism 02/13/2016  . Stress at home 02/13/2016  . UGIB (upper gastrointestinal bleed) 04/25/2015  . HTN (hypertension) 04/25/2015  . Alcohol abuse 04/25/2015  . Acute ulcer at gastrojejunal region   . Distal radius fracture, right 01/15/2013    Darrel Hoover 12/04/2016, 3:33 PM  [facility] [address] [place], [place], [address] Phone: [phone]   Fax:  [phone]  Name: [name] MRN: [id number] Date of Birth: 1946-10-15

## 2016-12-04 NOTE — Patient Instructions (Signed)
Short Arc [employer] CompanyQuad    Place a large can or rolled towel under leg. Straighten knee and leg. Hold ____ seconds. Repeat with other leg. Repeat ____ times. Do ____ sessions per day.  http://gt2.exer.us/366   Copyright  VHI. All rights reserved.  Quad Set    With other leg bent, foot flat, slowly tighten muscles on thigh of straight leg while counting out loud to ____. Repeat with other leg. Repeat ____ times. Do ____ sessions per day.  http://gt2.exer.us/276   Copyright  VHI. All rights reserved.  Bridge    Lie back, legs bent. Inhale, pressing hips up. Keeping ribs in, lengthen lower back. Exhale, rolling down along spine from top. Repeat ____ times. Do ____ sessions per day.  http://pm.exer.us/55   Copyright  VHI. All rights reserved.  Side-Lying Leg Lift    Lie on side, both legs straight and in alignment with body. Lift top leg toward ceiling, leading with side of foot. Do NOT rotate leg outward or bring leg forward. Hold ___ seconds. Relax and lower leg. Repeat ___ times, each leg. * Variation: Bend underneath knee for stability.  Copyright  VHI. All rights reserved.ALL EXERY 2X/DAY 10-20 REPS HOLD 1-3 SEC.  STOP IF PAINFUL

## 2016-12-05 ENCOUNTER — Encounter: Payer: Self-pay | Admitting: Sports Medicine

## 2016-12-05 ENCOUNTER — Ambulatory Visit (INDEPENDENT_AMBULATORY_CARE_PROVIDER_SITE_OTHER): Payer: Medicare Other | Admitting: Sports Medicine

## 2016-12-05 DIAGNOSIS — M170 Bilateral primary osteoarthritis of knee: Secondary | ICD-10-CM

## 2016-12-05 NOTE — Patient Instructions (Signed)
Try to minimize the use of ibuprofen and use Tylenol arthritis strength 650 mg 3 times per day.

## 2016-12-05 NOTE — Assessment & Plan Note (Signed)
Doing extremely well after bilateral aspiration and injection. Is also just now starting physical therapy. History of renal dysfunction and peptic ulcer disease so I have again advised her to decrease her NSAID use, I would certainly recommend that she use arthritis strength Tylenol instead. She hasn't needed any tramadol. The leg swelling has resolved and was likely just dependent edema from her knee arthritis. I'm giving her the information on Visco supplementation and she understands to call me to get it approved if she would like to proceed. Return as needed.

## 2016-12-05 NOTE — Progress Notes (Signed)
  Subjective:    CC: Follow-up  HPI: This is a pleasant 71 year old female, we aspirated and injected both knees at the last visit and she did extremely well, only has a bit of pain now but is happy with results. She is also starting physical therapy and agrees to talk to her PCP about weight loss medication. She is post gastric bypass. She also reports that her lower extremity edema has resolved.  Past medical history:  Negative.  See flowsheet/record as well for more information.  Surgical history: Negative.  See flowsheet/record as well for more information.  Family history: Negative.  See flowsheet/record as well for more information.  Social history: Negative.  See flowsheet/record as well for more information.  Allergies, and medications have been entered into the medical record, reviewed, and no changes needed.   Review of Systems: No fevers, chills, night sweats, weight loss, chest pain, or shortness of breath.   Objective:    General: Well Developed, well nourished, and in no acute distress.  Neuro: Alert and oriented x3, extra-ocular muscles intact, sensation grossly intact.  HEENT: Normocephalic, atraumatic, pupils equal round reactive to light, neck supple, no masses, no lymphadenopathy, thyroid nonpalpable.  Skin: Warm and dry, no rashes. Cardiac: Regular rate and rhythm, no murmurs rubs or gallops, no lower extremity edema.  Respiratory: Clear to auscultation bilaterally. Not using accessory muscles, speaking in full sentences.  Impression and Recommendations:    Primary osteoarthritis of both knees Doing extremely well after bilateral aspiration and injection. Is also just now starting physical therapy. History of renal dysfunction and peptic ulcer disease so I have again advised her to decrease her NSAID use, I would certainly recommend that she use arthritis strength Tylenol instead. She hasn't needed any tramadol. The leg swelling has resolved and was likely just  dependent edema from her knee arthritis. I'm giving her the information on Visco supplementation and she understands to call me to get it approved if she would like to proceed. Return as needed.

## 2016-12-11 ENCOUNTER — Ambulatory Visit: Payer: Medicare Other

## 2016-12-12 ENCOUNTER — Ambulatory Visit: Payer: Medicare Other

## 2016-12-12 DIAGNOSIS — R600 Localized edema: Secondary | ICD-10-CM | POA: Diagnosis not present

## 2016-12-12 DIAGNOSIS — M6281 Muscle weakness (generalized): Secondary | ICD-10-CM

## 2016-12-12 DIAGNOSIS — M170 Bilateral primary osteoarthritis of knee: Secondary | ICD-10-CM | POA: Diagnosis not present

## 2016-12-12 DIAGNOSIS — M25562 Pain in left knee: Secondary | ICD-10-CM

## 2016-12-12 DIAGNOSIS — M25561 Pain in right knee: Secondary | ICD-10-CM | POA: Diagnosis not present

## 2016-12-12 DIAGNOSIS — G8929 Other chronic pain: Secondary | ICD-10-CM | POA: Diagnosis not present

## 2016-12-12 NOTE — Therapy (Signed)
[facility] [address] [place], [place], [address] Phone: [phone]   Fax:  [phone]  Physical Therapy Treatment  Patient Details  Name: [name] MRN: [id number] Date of Birth: 01-16-1946 Referring Provider: T. Benjamin Stain, MD  Encounter Date: 12/12/2016      PT End of Session - 12/12/16 1344    Visit Number 2   Number of Visits 6   Date for PT Re-Evaluation 01/08/17   Authorization Type Medicare   PT Start Time 0138  late 12 min   PT Stop Time 0218   PT Time Calculation (min) 40 min   Activity Tolerance Patient tolerated treatment well   Behavior During Therapy [facility] for tasks assessed/performed      Past Medical History:  Diagnosis Date  . Alcohol abuse 04/25/2015  . Allergy   . Anastomotic ulcer S/P gastric bypass 06/18/2016  . Arthritis   . Cataract   . Degenerative disc disease, cervical   . GAD (generalized anxiety disorder) 06/14/2016  . HTN (hypertension) 04/25/2015  . Hypertension   . Hypothyroidism   . 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 Substance abuse    Alcohol abuse 2016  . UGIB (upper gastrointestinal bleed) 04/25/2015    Past Surgical History:  Procedure Laterality Date  . CHOLECYSTECTOMY    . COSMETIC SURGERY    . DIAGNOSTIC LAPAROSCOPY    . ESOPHAGOGASTRODUODENOSCOPY N/A 04/25/2015   Procedure: ESOPHAGOGASTRODUODENOSCOPY (EGD);  Surgeon: Beverley Fiedler, MD;  Location: [facility] ENDOSCOPY;  Service: Endoscopy;  Laterality: N/A;  . FRACTURE SURGERY    . GASTRIC BYPASS    . OPEN REDUCTION INTERNAL FIXATION (ORIF) DISTAL RADIAL FRACTURE Right 01/15/2013   Procedure: OPEN REDUCTION INTERNAL FIXATION (ORIF) Right DISTAL RADIUS FRACTURE;  Surgeon: Eldred Manges, MD;  Location: [facility] OR;  Service: Orthopedics;  Laterality: Right;    There were no vitals filed for this visit.      Subjective Assessment - 12/12/16 1341    Subjective RT knee  buckled  4 days ago but no problems since up to 20 reps now.  Walked 1/3 mile  and knees sore after that.    Currently in Pain? Yes   Pain Score 4    Pain Location Knee   Pain Orientation Right;Left   Pain Descriptors / Indicators Sore   Pain Type Chronic pain   Pain Onset More than a month ago   Pain Frequency Intermittent   Aggravating Factors  walk / stairs/ exercises /   Pain Relieving Factors Meds                         [facility] Adult PT Treatment/Exercise - 12/12/16 0001      Self-Care   Self-Care Other Self-Care Comments   Other Self-Care Comments  Instructed pt in need to decrease load to knees to decr load and stress to knees and that use of canr may help to decrease pain and provide safety with knee instability     Knee/Hip Exercises: Seated   Other Seated Knee/Hip Exercises seated hamstring stretch RT and LT 3 x 30 sec     Knee/Hip Exercises: Supine   Quad Sets Right;Left;10 reps  10 reps   Short Arc Quad Sets Right;Left;15 reps   Short Arc [employer] Limitations added 2 pounds   [employer] Limitations used large bolster to decr hamstring tension  Knee/Hip Exercises: Sidelying   Hip ABduction Right;Left;15 reps                PT Education - 12/12/16 1441    Education provided Yes   Education Details sidelye clam and hip abduction and DF streetch   Person(s) Educated Patient   Methods Explanation;Tactile cues;Verbal cues;Handout   Comprehension Returned demonstration;Verbalized understanding          PT Short Term Goals - 12/12/16 1349      PT SHORT TERM GOAL #1   Title She will be independent with inital HEP   Status Achieved           PT Long Term Goals - 12/04/16 1447      PT LONG TERM GOAL #1   Title She will be independent in all HEP issued   Time 6   Status New     PT LONG TERM GOAL #2   Title She will report improved ability to walk steps and to do short squat for home tasks.    Time 6    Period Weeks   Status New     PT LONG TERM GOAL #3   Title SHE WILL REPORT OVERALL IMPROVEMENT IN PAIN 75% WITH ACTIVITY IN AND OUT OF HOME   Time 6   Period Weeks   Status New               Plan - 12/12/16 1346    Clinical Impression Statement Knee sore today but still better after injection and aspiration. She was quite active [place]. no increased pian post session.    PT Treatment/Interventions Cryotherapy;Patient/family education;Manual techniques;Therapeutic exercise;Iontophoresis 4mg /ml Dexamethasone;Taping   PT Next Visit Plan Reveiw and add to HEP, STW medial thigh and quads   PT Home Exercise Plan QS/SLR, SAQ, , side lye clam and abduciton hip , DF stretch   Consulted and Agree with Plan of Care Patient      Patient will benefit from skilled therapeutic intervention in order to improve the following deficits and impairments:  Obesity, Pain, Decreased range of motion, Decreased activity tolerance  Visit Diagnosis: Bilateral lower extremity edema  Osteoarthritis of both knees, unspecified osteoarthritis type  Muscle weakness (generalized)  Chronic pain of both knees  Localized edema     Problem List Patient Active Problem List   Diagnosis Date Noted  . Bilateral lower extremity edema 11/07/2016  . Primary osteoarthritis of both knees 11/02/2016  . Hypertriglyceridemia 09/29/2016  . HLD (hyperlipidemia) 09/29/2016  . Vitamin D deficiency 09/29/2016  . Adjustment disorder with mixed anxiety and depressed mood 09/29/2016  . Anastomotic ulcer S/P gastric bypass 06/18/2016  . Morbid obesity (HCC) 06/18/2016  . R Congenital Kidney ABN: Ess only has one fxn kidney 06/18/2016  . History of hysterectomy 06/18/2016  . H/O gastric bypass 06/18/2016  . Peripheral edema- b/l L Ext w chronic skin changes 06/18/2016  . Insomnia due to alcohol excess and stress 06/18/2016  . GAD (generalized anxiety disorder) 06/14/2016  . h/o  Iron deficiency anemia 06/14/2016  . Myalgia and myositis 06/14/2016  . Hypothyroidism 02/13/2016  . Stress at home 02/13/2016  . UGIB (upper gastrointestinal bleed) 04/25/2015  . HTN (hypertension) 04/25/2015  . Alcohol abuse 04/25/2015  . Acute ulcer at gastrojejunal region   . Distal radius fracture, right 01/15/2013    Caprice RedChasse, Laguana Desautel M  PT 12/12/2016, 2:50 PM  [facility] [address] [place], [place], [id number]  Phone: [phone]   Fax:  [phone]  Name: THARON KITCH MRN: [id number] Date of Birth: 06-14-1946

## 2016-12-12 NOTE — Patient Instructions (Signed)
From cabinet issued DF stretching 3x/day 30 sec x 2-3 reps and side lye hip abduction and clam 2-3 x /day RT and LT 10-20 reps

## 2016-12-18 ENCOUNTER — Ambulatory Visit: Payer: Medicare Other

## 2016-12-20 ENCOUNTER — Other Ambulatory Visit: Payer: Self-pay | Admitting: Family Medicine

## 2016-12-25 ENCOUNTER — Ambulatory Visit: Payer: Medicare Other

## 2016-12-25 DIAGNOSIS — M6281 Muscle weakness (generalized): Secondary | ICD-10-CM | POA: Diagnosis not present

## 2016-12-25 DIAGNOSIS — M25562 Pain in left knee: Secondary | ICD-10-CM

## 2016-12-25 DIAGNOSIS — G8929 Other chronic pain: Secondary | ICD-10-CM | POA: Diagnosis not present

## 2016-12-25 DIAGNOSIS — M25561 Pain in right knee: Secondary | ICD-10-CM | POA: Diagnosis not present

## 2016-12-25 DIAGNOSIS — M170 Bilateral primary osteoarthritis of knee: Secondary | ICD-10-CM | POA: Diagnosis not present

## 2016-12-25 DIAGNOSIS — R600 Localized edema: Secondary | ICD-10-CM

## 2016-12-25 NOTE — Therapy (Addendum)
[name], [place], [address] Phone: [phone]   Fax:  [phone]  Physical Therapy Treatment/ Discharge  Patient Details  Name: [name] MRN: [id number] Date of Birth: 1946-03-11 Referring Provider: T. Dianah Field, MD  Encounter Date: 12/25/2016      PT End of Session - 12/25/16 1339    Visit Number 3   Number of Visits 6   Date for PT Re-Evaluation 01/08/17   Authorization Type Medicare   PT Start Time 0135   PT Stop Time 0215   PT Time Calculation (min) 40 min   Activity Tolerance Patient tolerated treatment well   Behavior During Therapy [facility] for tasks assessed/performed      Past Medical History:  Diagnosis Date  . Alcohol abuse 04/25/2015  . Allergy   . Anastomotic ulcer S/P gastric bypass 06/18/2016  . Arthritis   . Cataract   . Degenerative disc disease, cervical   . GAD (generalized anxiety disorder) 06/14/2016  . HTN (hypertension) 04/25/2015  . Hypertension   . Hypothyroidism   . 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 Substance abuse    Alcohol abuse 2016  . UGIB (upper gastrointestinal bleed) 04/25/2015    Past Surgical History:  Procedure Laterality Date  . CHOLECYSTECTOMY    . COSMETIC SURGERY    . DIAGNOSTIC LAPAROSCOPY    . ESOPHAGOGASTRODUODENOSCOPY N/A 04/25/2015   Procedure: ESOPHAGOGASTRODUODENOSCOPY (EGD);  Surgeon: Jerene Bears, MD;  Location: [facility] ENDOSCOPY;  Service: Endoscopy;  Laterality: N/A;  . FRACTURE SURGERY    . GASTRIC BYPASS    . OPEN REDUCTION INTERNAL FIXATION (ORIF) DISTAL RADIAL FRACTURE Right 01/15/2013   Procedure: OPEN REDUCTION INTERNAL FIXATION (ORIF) Right DISTAL RADIUS FRACTURE;  Surgeon: Marybelle Killings, MD;  Location: [place];  Service: Orthopedics;  Laterality: Right;    There were no vitals filed for this visit.      Subjective Assessment - 12/25/16 1335    Subjective Doing well .  Knees less sore  and only notice if going up stairs  one at  time. Doesn't push it. Need to maximize what I have.   Currently in Pain? Yes   Pain Score --  mild - moderate   Pain Location Knee   Pain Orientation Right   Pain Descriptors / Indicators Sore   Pain Type Chronic pain   Pain Onset More than a month ago   Pain Frequency Intermittent   Aggravating Factors  walking staris   Pain Relieving Factors meds.                          [place] Adult PT Treatment/Exercise - 12/25/16 0001      Knee/Hip Exercises: Aerobic   Recumbent Bike 5 min      Knee/Hip Exercises: Supine   Short Arc Quad Sets Right;Left;15 reps   Short Arc [employer] Limitations 3 pounds   [employer] Limitations used large bolster to decr hamstring tension   Other Supine Knee/Hip Exercises clams x 20 red band around knees,   RT LT 3 pounds x10 the no weight 2x5 reps  bent knee 90 /90     Manual Therapy   Manual Therapy Soft tissue mobilization   Soft tissue mobilization With tool Rt and Lt thighs medial to lateral.  with knee flexion stretch. Instructed in use of roller she stated she has at home  PT Short Term Goals - 12/12/16 1349      PT SHORT TERM GOAL #1   Title She will be independent with inital HEP   Status Achieved           PT Long Term Goals - 12/04/16 1447      PT LONG TERM GOAL #1   Title She will be independent in all HEP issued   Time 6   Status New     PT LONG TERM GOAL #2   Title She will report improved ability to walk steps and to do short squat for home tasks.    Time 6   Period Weeks   Status New     PT LONG TERM GOAL #3   Title SHE WILL REPORT OVERALL IMPROVEMENT IN PAIN 75% WITH ACTIVITY IN AND OUT OF HOME   Time 6   Period Weeks   Status New               Plan - 12/25/16 1340    Clinical Impression Statement No worse after session today and pt reported she may just continue at home and would decide in  next week if she will discharge or  follow up 1 more time   PT Treatment/Interventions Cryotherapy;Patient/family education;Manual techniques;Therapeutic exercise;Iontophoresis '4mg'$ /ml Dexamethasone;Taping   PT Next Visit Plan Reveiw and add to HEP, STW medial thigh and quads   PT Home Exercise Plan QS/SLR, SAQ, , side lye clam and abduciton hip , DF stretch   Consulted and Agree with Plan of Care Patient      Patient will benefit from skilled therapeutic intervention in order to improve the following deficits and impairments:  Obesity, Pain, Decreased range of motion, Decreased activity tolerance  Visit Diagnosis: Bilateral lower extremity edema  Osteoarthritis of both knees, unspecified osteoarthritis type  Muscle weakness (generalized)  Chronic pain of both knees  Localized edema     Problem List Patient Active Problem List   Diagnosis Date Noted  . Bilateral lower extremity edema 11/07/2016  . Primary osteoarthritis of both knees 11/02/2016  . Hypertriglyceridemia 09/29/2016  . HLD (hyperlipidemia) 09/29/2016  . Vitamin D deficiency 09/29/2016  . Adjustment disorder with mixed anxiety and depressed mood 09/29/2016  . Anastomotic ulcer S/P gastric bypass 06/18/2016  . Morbid obesity ([place]) 06/18/2016  . R Congenital Kidney ABN: Ess only has one fxn kidney 06/18/2016  . History of hysterectomy 06/18/2016  . H/O gastric bypass 06/18/2016  . Peripheral edema- b/l L Ext w chronic skin changes 06/18/2016  . Insomnia due to alcohol excess and stress 06/18/2016  . GAD (generalized anxiety disorder) 06/14/2016  . h/o Iron deficiency anemia 06/14/2016  . Myalgia and myositis 06/14/2016  . Hypothyroidism 02/13/2016  . Stress at home 02/13/2016  . UGIB (upper gastrointestinal bleed) 04/25/2015  . HTN (hypertension) 04/25/2015  . Alcohol abuse 04/25/2015  . Acute ulcer at gastrojejunal region   . Distal radius fracture, right 01/15/2013    Darrel Hoover  PT 12/25/2016,  2:58 PM  [facility] [address] [place], [place], [address] Phone: [phone]   Fax:  [phone]  Name: [name] MRN: [id number] Date of Birth: [birth year]  PHYSICAL THERAPY DISCHARGE SUMMARY  Visits from Start of Care: 3  Current functional level related to goals / functional outcomes: See above. She canceled after this visit stating she did not need to continue   Remaining deficits: See above   Education / Equipment: HEP Plan: Patient agrees  to discharge.  Patient goals were partially met. Patient is being discharged due to being pleased with the current functional level.  ?????    Lillette Boxer Juliane Guest  PT   02/06/17    12:13 PM

## 2017-01-01 ENCOUNTER — Ambulatory Visit: Payer: Medicare Other

## 2017-01-03 ENCOUNTER — Other Ambulatory Visit: Payer: Self-pay | Admitting: Family Medicine

## 2017-01-04 ENCOUNTER — Telehealth: Payer: Self-pay | Admitting: Family Medicine

## 2017-01-04 NOTE — Telephone Encounter (Signed)
Called pt left message that a f/u appt. Needed for medication refill per nurse/ Marchelle Folksonya Nelson. --glh

## 2017-01-08 ENCOUNTER — Ambulatory Visit: Payer: Medicare Other

## 2017-01-10 ENCOUNTER — Ambulatory Visit (INDEPENDENT_AMBULATORY_CARE_PROVIDER_SITE_OTHER): Payer: Medicare Other | Admitting: Adult Health

## 2017-01-10 ENCOUNTER — Other Ambulatory Visit: Payer: Self-pay | Admitting: Family Medicine

## 2017-01-10 ENCOUNTER — Encounter: Payer: Self-pay | Admitting: Adult Health

## 2017-01-10 VITALS — BP 112/71 | HR 72 | Ht 66.0 in | Wt 255.9 lb

## 2017-01-10 DIAGNOSIS — Z23 Encounter for immunization: Secondary | ICD-10-CM

## 2017-01-10 DIAGNOSIS — F4323 Adjustment disorder with mixed anxiety and depressed mood: Secondary | ICD-10-CM

## 2017-01-10 DIAGNOSIS — I10 Essential (primary) hypertension: Secondary | ICD-10-CM

## 2017-01-10 DIAGNOSIS — F1010 Alcohol abuse, uncomplicated: Secondary | ICD-10-CM

## 2017-01-10 DIAGNOSIS — F411 Generalized anxiety disorder: Secondary | ICD-10-CM | POA: Diagnosis not present

## 2017-01-10 NOTE — Progress Notes (Signed)
Subjective:    Patient ID: [name], female    DOB: May 22, 1946, 71 y.o.   MRN: [id number]  HPI:  [name] presents for follow-up on HTN and depression.  She has been taking Carvedilol 3.125 daily and Prozac 10mg  daily, she reports higher dose decreased her libido.  She denies CP/Dyspnea/dizziness/HA/N/V/hypotension. She continues to drink 1 bottle of Chardonnay daily.  She denies thoughts of harming herself/others and says "I have hope, I am happy.  My husband and I really very happy together".  She reports reduction of daily walking due to knee pain, weight gain, and cold weather.   Patient Care Team    Relationship Specialty Notifications Start End  Thomasene Lot, DO PCP - General Family Medicine  06/14/16   Monica Becton, MD Consulting Physician Family Medicine  11/08/16    Comment: OA- knee injections; PT    Patient Active Problem List   Diagnosis Date Noted  . Bilateral lower extremity edema 11/07/2016  . Primary osteoarthritis of both knees 11/02/2016  . Hypertriglyceridemia 09/29/2016  . HLD (hyperlipidemia) 09/29/2016  . Vitamin D deficiency 09/29/2016  . Adjustment disorder with mixed anxiety and depressed mood 09/29/2016  . Anastomotic ulcer S/P gastric bypass 06/18/2016  . Morbid obesity (HCC) 06/18/2016  . R Congenital Kidney ABN: Ess only has one fxn kidney 06/18/2016  . History of hysterectomy 06/18/2016  . H/O gastric bypass 06/18/2016  . Peripheral edema- b/l L Ext w chronic skin changes 06/18/2016  . Insomnia due to alcohol excess and stress 06/18/2016  . GAD (generalized anxiety disorder) 06/14/2016  . h/o Iron deficiency anemia 06/14/2016  . Myalgia and myositis 06/14/2016  . Hypothyroidism 02/13/2016  . Stress at home 02/13/2016  . UGIB (upper gastrointestinal bleed) 04/25/2015  . HTN (hypertension) 04/25/2015  . Alcohol abuse 04/25/2015  . Acute ulcer at gastrojejunal region   . Distal radius fracture, right 01/15/2013     Past  Medical History:  Diagnosis Date  . Alcohol abuse 04/25/2015  . Allergy   . Anastomotic ulcer S/P gastric bypass 06/18/2016  . Arthritis   . Cataract   . Degenerative disc disease, cervical   . GAD (generalized anxiety disorder) 06/14/2016  . HTN (hypertension) 04/25/2015  . Hypertension   . Hypothyroidism   . 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 Substance abuse    Alcohol abuse 2016  . UGIB (upper gastrointestinal bleed) 04/25/2015     Past Surgical History:  Procedure Laterality Date  . CHOLECYSTECTOMY    . COSMETIC SURGERY    . DIAGNOSTIC LAPAROSCOPY    . ESOPHAGOGASTRODUODENOSCOPY N/A 04/25/2015   Procedure: ESOPHAGOGASTRODUODENOSCOPY (EGD);  Surgeon: Beverley Fiedler, MD;  Location: [facility] ENDOSCOPY;  Service: Endoscopy;  Laterality: N/A;  . FRACTURE SURGERY    . GASTRIC BYPASS    . OPEN REDUCTION INTERNAL FIXATION (ORIF) DISTAL RADIAL FRACTURE Right 01/15/2013   Procedure: OPEN REDUCTION INTERNAL FIXATION (ORIF) Right DISTAL RADIUS FRACTURE;  Surgeon: Eldred Manges, MD;  Location: [facility] OR;  Service: Orthopedics;  Laterality: Right;     Family History  Problem Relation Age of Onset  . Heart disease Mother   . Stroke Mother   . Cancer Mother     skin and breast  . Hypertension Mother   . COPD Father   . Cancer Father   . Diabetes Father   . Hypertension Father   . Stroke Maternal Grandmother   . Mental illness Maternal Grandmother   .  Cancer Paternal Grandmother      History  Drug Use No     History  Alcohol Use  . 16.8 oz/week  . 28 Glasses of wine per week    Comment: drinks daily and has a bottle of wine per day     History  Smoking Status  . Never Smoker  Smokeless Tobacco  . Never Used     Outpatient Encounter Prescriptions as of 01/10/2017  Medication Sig  . acetaminophen (TYLENOL) 325 MG tablet Take 650 mg by mouth every 6 (six) hours as needed.  . benazepril-hydrochlorthiazide (LOTENSIN HCT) 20-25 MG  tablet Take 1 tablet by mouth daily.  . carvedilol (COREG) 3.125 MG tablet TAKE 1 TABLET (3.125 MG TOTAL) BY MOUTH 2 TIMES DAILY WITH A MEAL.  Marland Kitchen. Cholecalciferol (VITAMIN D3) 5000 units TABS 5,000 IU OTC vitamin D3 daily.  . diphenhydrAMINE (SOMINEX) 25 MG tablet Take 25 mg by mouth at bedtime as needed for sleep.  . ferrous sulfate 325 (65 FE) MG tablet Take 325 mg by mouth daily with breakfast.  . FLUoxetine (PROZAC) 10 MG tablet Take 10 mg by mouth daily.  . furosemide (LASIX) 40 MG tablet Take 20 mg by mouth as needed.  Marland Kitchen. ibuprofen (ADVIL,MOTRIN) 600 MG tablet Take 600 mg by mouth 2 (two) times daily.  Marland Kitchen. levothyroxine (SYNTHROID, LEVOTHROID) 150 MCG tablet Take 1 tablet (150 mcg total) by mouth daily.  . magnesium 30 MG tablet Take 30 mg by mouth 2 (two) times a week.   . Melatonin 5 MG CAPS Take 10 mg by mouth at bedtime.   . Multiple Vitamin (MULTIVITAMIN) tablet Take 1 tablet by mouth daily.  . pantoprazole (PROTONIX) 40 MG tablet Take 1 tablet (40 mg total) by mouth 2 (two) times daily. (Patient taking differently: Take 40 mg by mouth daily. )  . [DISCONTINUED] FLUoxetine (PROZAC) 10 MG tablet Take 1 tablet (10 mg total) by mouth daily. Patient must have office visit prior to any further refills  . [DISCONTINUED] furosemide (LASIX) 40 MG tablet Take 1 tablet (40 mg total) by mouth daily. (Patient taking differently: Take 20 mg by mouth as needed. )  . [DISCONTINUED] traMADol (ULTRAM) 50 MG tablet Take 1-2 tablets (50-100 mg total) by mouth every 6 (six) hours as needed.   No facility-administered encounter medications on file as of 01/10/2017.     Allergies: Cocoa; Red dye; Lyrica [pregabalin]; and Sulfa antibiotics  Body mass index is 41.3 kg/m.  Blood pressure 112/71, pulse 72, height 5\' 6"  (1.676 m), weight 255 lb 14.4 oz (116.1 kg).     Review of Systems  Constitutional: Positive for activity change. Negative for appetite change, chills, diaphoresis, fatigue, fever and  unexpected weight change.  HENT: Negative for congestion.   Eyes: Negative for visual disturbance.  Respiratory: Negative for cough and shortness of breath.   Cardiovascular: Positive for leg swelling. Negative for chest pain and palpitations.       Bil ankle/foot edema-chronic nature.  Gastrointestinal: Negative for abdominal distention, abdominal pain, blood in stool, constipation, diarrhea, nausea and vomiting.  Endocrine: Negative for cold intolerance, heat intolerance, polydipsia, polyphagia and polyuria.  Genitourinary: Negative for difficulty urinating and flank pain.  Musculoskeletal: Positive for arthralgias, gait problem, joint swelling and myalgias. Negative for back pain.  Skin: Negative for color change, pallor, rash and wound.  Neurological: Negative for dizziness, tremors, weakness and numbness.  Psychiatric/Behavioral: Positive for sleep disturbance. Negative for agitation, behavioral problems, confusion, self-injury and suicidal ideas. The patient is  not nervous/anxious.        Objective:   Physical Exam  Constitutional: She is oriented to person, place, and time. She appears well-developed and well-nourished. No distress.  HENT:  Head: Normocephalic and atraumatic.  Mouth/Throat: Abnormal dentition. Dental caries present.  Eyes: Conjunctivae and EOM are normal. Pupils are equal, round, and reactive to light.  Cardiovascular: Normal rate, regular rhythm, normal heart sounds and intact distal pulses.   Pulmonary/Chest: Effort normal and breath sounds normal. No respiratory distress. She has no wheezes. She has no rales. She exhibits no tenderness.  Musculoskeletal: She exhibits edema and tenderness.       Right knee: She exhibits swelling and bony tenderness.       Left knee: She exhibits swelling and bony tenderness.       Right ankle: She exhibits swelling and ecchymosis. She exhibits normal pulse. Achilles tendon exhibits no pain.       Left ankle: She exhibits  swelling and ecchymosis. She exhibits normal pulse. Achilles tendon exhibits no pain.  Neurological: She is alert and oriented to person, place, and time. She has normal reflexes.  Skin: Skin is warm and dry. No rash noted. She is not diaphoretic. There is erythema. No pallor.  Psychiatric: She has a normal mood and affect. Her behavior is normal. Judgment and thought content normal.  Laughed and smiled throughout encounter.  Nursing note and vitals reviewed.         Assessment & Plan:   1. Essential hypertension   2. Alcohol abuse   3. GAD (generalized anxiety disorder)   4. Adjustment disorder with mixed anxiety and depressed mood     HTN (hypertension) Continue medications as directed. Reduce alcohol and sodium intake. Increase regular exercise.  Alcohol abuse Discussed ETOH consumption > 20 mins. Reports drinking 1 bottle of chardonnay daily. Retired last year after 40 years - [employer]. Denies drinking causes problems in marriage or other personal relationships. She denies engaging in risky behavior while drinking, I.e. Driving. She declined offer for outpatient treatment program. Provided information on HARM Reduction program to decrease ETOH use. Discussed CDC safe drinking guidelines.  GAD (generalized anxiety disorder) Continue Prozac 10mg  daily, she reports higher dose decreased her libido.  Adjustment disorder with mixed anxiety and depressed mood Continue Prozac 10mg  daily, she reports higher dose decreased her libido.    FOLLOW-UP:  Return in about 3 months (around 04/09/2017) for Regular Follow Up, HTN, Obesity, Depression.

## 2017-01-10 NOTE — Assessment & Plan Note (Signed)
Continue medications as directed. Reduce alcohol and sodium intake. Increase regular exercise.

## 2017-01-10 NOTE — Patient Instructions (Signed)
Alcohol Use Disorder Alcohol use disorder is when your drinking disrupts your daily life. When you have this condition, you drink too much alcohol and you cannot control your drinking. Alcohol use disorder can cause serious problems with your physical health. It can affect your brain, heart, liver, pancreas, immune system, stomach, and intestines. Alcohol use disorder can increase your risk for certain cancers and cause problems with your mental health, such as depression, anxiety, psychosis, delirium, and dementia. People with this disorder risk hurting themselves and others. What are the causes? This condition is caused by drinking too much alcohol over time. It is not caused by drinking too much alcohol only one or two times. Some people with this condition drink alcohol to cope with or escape from negative life events. Others drink to relieve pain or symptoms of mental illness. What increases the risk? You are more likely to develop this condition if:  You have a family history of alcohol use disorder.  Your culture encourages drinking to the point of intoxication, or makes alcohol easy to get.  You had a mood or conduct disorder in childhood.  You have been a victim of abuse.  You are an adolescent and:  You have poor grades or difficulties in school.  Your caregivers do not talk to you about saying no to alcohol, or supervise your activities.  You are impulsive or you have trouble with self-control. What are the signs or symptoms? Symptoms of this condition include:  Drinkingmore than you want to.  Drinking for longer than you want to.  Trying several times to drink less or to control your drinking.  Spending a lot of time getting alcohol, drinking, or recovering from drinking.  Craving alcohol.  Having problems at work, at school, or at home due to drinking.  Having problems in relationships due to drinking.  Drinking when it is dangerous to drink, such as before  driving a car.  Continuing to drink even though you know you might have a physical or mental problem related to drinking.  Needing more and more alcohol to get the same effect you want from the alcohol (building up tolerance).  Having symptoms of withdrawal when you stop drinking. Symptoms of withdrawal include:  Fatigue.  Nightmares.  Trouble sleeping.  Depression.  Anxiety.  Fever.  Seizures.  Severe confusion.  Feeling or seeing things that are not there (hallucinations).  Tremors.  Rapid heart rate.  Rapid breathing.  High blood pressure.  Drinking to avoid symptoms of withdrawal. How is this diagnosed? This condition is diagnosed with an assessment. Your health care provider may start the assessment by asking three or four questions about your drinking. Your health care provider may perform a physical exam or do lab tests to see if you have physical problems resulting from alcohol use. She or he may refer you to a mental health professional for evaluation. How is this treated? Some people with alcohol use disorder are able to reduce their alcohol use to low-risk levels. Others need to completely quit drinking alcohol. When necessary, mental health professionals with specialized training in substance use treatment can help. Your health care provider can help you decide how severe your alcohol use disorder is and what type of treatment you need. The following forms of treatment are available:  Detoxification. Detoxification involves quitting drinking and using prescription medicines within the first week to help lessen withdrawal symptoms. This treatment is important for people who have had withdrawal symptoms before and for heavy drinkers  who are likely to have withdrawal symptoms. Alcohol withdrawal can be dangerous, and in severe cases, it can cause death. Detoxification may be provided in a home, community, or primary care setting, or in a hospital or substance use  treatment facility.  Counseling. This treatment is also called talk therapy. It is provided by substance use treatment counselors. A counselor can address the reasons you use alcohol and suggest ways to keep you from drinking again or to prevent problem drinking. The goals of talk therapy are to:  Find healthy activities and ways for you to cope with stress.  Identify and avoid the things that trigger your alcohol use.  Help you learn how to handle cravings.  Medicines.Medicines can help treat alcohol use disorder by:  Decreasing alcohol cravings.  Decreasing the positive feeling you have when you drink alcohol.  Causing an uncomfortable physical reaction when you drink alcohol (aversion therapy).  Support groups. Support groups are led by people who have quit drinking. They provide emotional support, advice, and guidance. These forms of treatment are often combined. Some people with this condition benefit from a combination of treatments provided by specialized substance use treatment centers. Follow these instructions at home:  Take over-the-counter and prescription medicines only as told by your health care provider.  Check with your health care provider before starting any new medicines.  Ask friends and family members not to offer you alcohol.  Avoid situations where alcohol is served, including gatherings where others are drinking alcohol.  Create a plan for what to do when you are tempted to use alcohol.  Find hobbies or activities that you enjoy that do not include alcohol.  Keep all follow-up visits as told by your health care provider. This is important. How is this prevented?  If you drink, limit alcohol intake to no more than 1 drink a day for nonpregnant women and 2 drinks a day for men. One drink equals 12 oz of beer, 5 oz of wine, or 1 oz of hard liquor.  If you have a mental health condition, get treatment and support.  Do not give alcohol to  adolescents.  If you are an adolescent:  Do not drink alcohol.  Do not be afraid to say no if someone offers you alcohol. Speak up about why you do not want to drink. You can be a positive role model for your friends and set a good example for those around you by not drinking alcohol.  If your friends drink, spend time with others who do not drink alcohol. Make new friends who do not use alcohol.  Find healthy ways to manage stress and emotions, such as meditation or deep breathing, exercise, spending time in nature, listening to music, or talking with a trusted friend or family member. Contact a health care provider if:  You are not able to take your medicines as told.  Your symptoms get worse.  You return to drinking alcohol (relapse) and your symptoms get worse. Get help right away if:  You have thoughts about hurting yourself or others. If you ever feel like you may hurt yourself or others, or have thoughts about taking your own life, get help right away. You can go to your nearest emergency department or call:  Your local emergency services (911 in the [place]  A suicide crisis helpline, such as the [facility] at 2405613510. This is open 24 hours a day. Summary  Alcohol use disorder is when your drinking disrupts your daily  life. When you have this condition, you drink too much alcohol and you cannot control your drinking.  Treatment may include detoxification, counseling, medicine, and support groups.  Ask friends and family members not to offer you alcohol. Avoid situations where alcohol is served.  Get help right away if you have thoughts about hurting yourself or others. This information is not intended to replace advice given to you by your health care provider. Make sure you discuss any questions you have with your health care provider. Document Released: 12/21/2004 Document Revised: 08/10/2016 Document Reviewed: 08/10/2016 Elsevier  Interactive Patient Education  2017 [employer]   Heart-Healthy Eating Plan Introduction Heart-healthy meal planning includes:  Limiting unhealthy fats.  Increasing healthy fats.  Making other small dietary changes. You may need to talk with your doctor or a diet specialist (dietitian) to create an eating plan that is right for you. What types of fat should I choose?  Choose healthy fats. These include olive oil and canola oil, flaxseeds, walnuts, almonds, and seeds.  Eat more omega-3 fats. These include salmon, mackerel, sardines, tuna, flaxseed oil, and ground flaxseeds. Try to eat fish at least twice each week.  Limit saturated fats.  Saturated fats are often found in animal products, such as meats, butter, and cream.  Plant sources of saturated fats include palm oil, palm kernel oil, and coconut oil.  Avoid foods with partially hydrogenated oils in them. These include stick margarine, some tub margarines, cookies, crackers, and other baked goods. These contain trans fats. What general guidelines do I need to follow?  Check food labels carefully. Identify foods with trans fats or high amounts of saturated fat.  Fill one half of your plate with vegetables and green salads. Eat 4-5 servings of vegetables per day. A serving of vegetables is:  1 cup of raw leafy vegetables.   cup of raw or cooked cut-up vegetables.   cup of vegetable juice.  Fill one fourth of your plate with whole grains. Look for the word "whole" as the first word in the ingredient list.  Fill one fourth of your plate with lean protein foods.  Eat 4-5 servings of fruit per day. A serving of fruit is:  One medium whole fruit.   cup of dried fruit.   cup of fresh, frozen, or canned fruit.   cup of 100% fruit juice.  Eat more foods that contain soluble fiber. These include apples, broccoli, carrots, beans, peas, and barley. Try to get 20-30 g of fiber per day.  Eat more home-cooked food.  Eat less restaurant, buffet, and fast food.  Limit or avoid alcohol.  Limit foods high in starch and sugar.  Avoid fried foods.  Avoid frying your food. Try baking, boiling, grilling, or broiling it instead. You can also reduce fat by:  Removing the skin from poultry.  Removing all visible fats from meats.  Skimming the fat off of stews, soups, and gravies before serving them.  Steaming vegetables in water or broth.  Lose weight if you are overweight.  Eat 4-5 servings of nuts, legumes, and seeds per week:  One serving of dried beans or legumes equals  cup after being cooked.  One serving of nuts equals 1 ounces.  One serving of seeds equals  ounce or one tablespoon.  You may need to keep track of how much salt or sodium you eat. This is especially true if you have high blood pressure. Talk with your doctor or dietitian to get more information. What foods  can I eat? Grains  Breads, including [place], white, pita, wheat, raisin, rye, oatmeal, and [place]. Tortillas that are neither fried nor made with lard or trans fat. Low-fat rolls, including hotdog and hamburger buns and English muffins. Biscuits. Muffins. Waffles. Pancakes. Light popcorn. Whole-grain cereals. Flatbread. Melba toast. Pretzels. Breadsticks. Rusks. Low-fat snacks. Low-fat crackers, including oyster, saltine, matzo, graham, animal, and rye. Rice and pasta, including brown rice and pastas that are made with whole wheat. Vegetables  All vegetables. Fruits  All fruits, but limit coconut. Meats and Other Protein Sources  Lean, well-trimmed beef, veal, pork, and lamb. Chicken and [place] without skin. All fish and shellfish. Wild duck, rabbit, pheasant, and venison. Egg whites or low-cholesterol egg substitutes. Dried beans, peas, lentils, and tofu. Seeds and most nuts. Dairy  Low-fat or nonfat cheeses, including ricotta, string, and mozzarella. Skim or 1% milk that is liquid, powdered, or evaporated. Buttermilk  that is made with low-fat milk. Nonfat or low-fat yogurt. Beverages  Mineral water. Diet carbonated beverages. Sweets and Desserts  Sherbets and fruit ices. Honey, jam, marmalade, jelly, and syrups. Meringues and gelatins. Pure sugar candy, such as hard candy, jelly beans, gumdrops, mints, marshmallows, and small amounts of dark chocolate. [employer]. Eat all sweets and desserts in moderation. Fats and Oils  Nonhydrogenated (trans-free) margarines. Vegetable oils, including soybean, sesame, sunflower, olive, peanut, safflower, corn, canola, and cottonseed. Salad dressings or mayonnaise made with a vegetable oil. Limit added fats and oils that you use for cooking, baking, salads, and as spreads. Other  Cocoa powder. Coffee and tea. All seasonings and condiments. The items listed above may not be a complete list of recommended foods or beverages. Contact your dietitian for more options.  What foods are not recommended? Grains  Breads that are made with saturated or trans fats, oils, or whole milk. Croissants. Butter rolls. Cheese breads. Sweet rolls. Donuts. Buttered popcorn. Chow mein noodles. High-fat crackers, such as cheese or butter crackers. Meats and Other Protein Sources  Fatty meats, such as hotdogs, short ribs, sausage, spareribs, bacon, rib eye roast or steak, and mutton. High-fat deli meats, such as salami and bologna. Caviar. Domestic duck and goose. Organ meats, such as kidney, liver, sweetbreads, and heart. Dairy  Cream, sour cream, cream cheese, and creamed cottage cheese. Whole-milk cheeses, including blue (bleu), [address], [place], [place], [address], [place], [address], cheddar, [place], and [place]. Whole or 2% milk that is liquid, evaporated, or condensed. Whole buttermilk. Cream sauce or high-fat cheese sauce. Yogurt that is made from whole milk. Beverages  Regular sodas and juice drinks with added sugar. Sweets and Desserts  Frosting. Pudding. Cookies. Cakes other than  angel food cake. Candy that has milk chocolate or white chocolate, hydrogenated fat, butter, coconut, or unknown ingredients. Buttered syrups. Full-fat ice cream or ice cream drinks. Fats and Oils  Gravy that has suet, meat fat, or shortening. Cocoa butter, hydrogenated oils, palm oil, coconut oil, palm kernel oil. These can often be found in baked products, candy, fried foods, nondairy creamers, and whipped toppings. Solid fats and shortenings, including bacon fat, salt pork, lard, and butter. Nondairy cream substitutes, such as coffee creamers and sour cream substitutes. Salad dressings that are made of unknown oils, cheese, or sour cream. The items listed above may not be a complete list of foods and beverages to avoid. Contact your dietitian for more information.  This information is not intended to replace advice given to you by your health care provider. Make sure you discuss any questions  you have with your health care provider. Document Released: 05/14/2012 Document Revised: 04/20/2016 Document Reviewed: 05/07/2014  2017 Elsevier  Discussed HARM REDUCTION for assistance with reducing alcohol consumption. Continue medications as directed. Please follow-up in 3 months or sooner if needed.

## 2017-01-10 NOTE — Addendum Note (Signed)
Addended by: Stan HeadNELSON, TONYA S on: 01/10/2017 01:15 PM   Modules accepted: Orders

## 2017-01-10 NOTE — Assessment & Plan Note (Signed)
Discussed ETOH consumption > 20 mins. Reports drinking 1 bottle of chardonnay daily. Retired last year after 40 years - [employer]. Denies drinking causes problems in marriage or other personal relationships. She denies engaging in risky behavior while drinking, I.e. Driving. She declined offer for outpatient treatment program. Provided information on HARM Reduction program to decrease ETOH use. Discussed CDC safe drinking guidelines.

## 2017-01-10 NOTE — Assessment & Plan Note (Signed)
Continue Prozac 10mg  daily, she reports higher dose decreased her libido.

## 2017-01-10 NOTE — Assessment & Plan Note (Addendum)
>>  ASSESSMENT AND PLAN FOR GAD (GENERALIZED ANXIETY DISORDER) WRITTEN ON 01/10/2017  1:04 PM BY BESS, Ladd Cen D, NP  Continue Prozac 10mg  daily, she reports higher dose decreased her libido.  >>ASSESSMENT AND PLAN FOR ADJUSTMENT DISORDER WITH MIXED ANXIETY AND DEPRESSED MOOD WRITTEN ON 01/10/2017  1:05 PM BY BESS, Mouhamad Teed D, NP  Continue Prozac 10mg  daily, she reports higher dose decreased her libido.

## 2017-02-01 ENCOUNTER — Other Ambulatory Visit: Payer: Self-pay | Admitting: Physician Assistant

## 2017-02-01 DIAGNOSIS — R600 Localized edema: Secondary | ICD-10-CM

## 2017-02-02 ENCOUNTER — Other Ambulatory Visit: Payer: Self-pay | Admitting: Adult Health

## 2017-02-02 DIAGNOSIS — R609 Edema, unspecified: Secondary | ICD-10-CM

## 2017-02-02 MED ORDER — FUROSEMIDE 40 MG PO TABS: 20.0000 mg | ORAL_TABLET | ORAL | 3 refills | Status: DC | PRN

## 2017-02-02 NOTE — Telephone Encounter (Signed)
Morning Tonya, I sent in Rx  30 day fill with 3 refills. Thanks! Orpha BurKaty

## 2017-02-02 NOTE — Telephone Encounter (Signed)
Laurance FlattenKaty Bess approved 02/02/17.  Tiajuana Amass. Nelson, CMA

## 2017-02-06 ENCOUNTER — Other Ambulatory Visit: Payer: Self-pay | Admitting: Family Medicine

## 2017-02-07 NOTE — Telephone Encounter (Signed)
We have not prescribed this medication for the patient in the past.  Please approval refill if appropriate.  Tiajuana Amass. Elazar Argabright, CMA

## 2017-03-09 ENCOUNTER — Other Ambulatory Visit: Payer: Self-pay | Admitting: Adult Health

## 2017-03-12 NOTE — Progress Notes (Unsigned)
Received refill request for fluoxetine.  Refill request denied.  Pt was informed 01/2017 that she must have OV prior to any further refills.  Pt has not been seen and does not have an appointment scheduled.  Will forward this note to Genella Rife to call pt to call pt to have her schedule an OV.  [name], CMA

## 2017-03-14 ENCOUNTER — Other Ambulatory Visit: Payer: Self-pay

## 2017-03-14 MED ORDER — FLUOXETINE HCL 10 MG PO TABS: 10.0000 mg | ORAL_TABLET | Freq: Every day | ORAL | 0 refills | Status: DC

## 2017-04-02 ENCOUNTER — Other Ambulatory Visit: Payer: Self-pay | Admitting: Family Medicine

## 2017-04-02 NOTE — Telephone Encounter (Signed)
Pt has appt 04/17/17

## 2017-04-09 ENCOUNTER — Other Ambulatory Visit: Payer: Self-pay | Admitting: Adult Health

## 2017-04-09 NOTE — Telephone Encounter (Signed)
Pt has appt 04/17/17.  [name]. Nelson, CMA

## 2017-04-17 ENCOUNTER — Ambulatory Visit (INDEPENDENT_AMBULATORY_CARE_PROVIDER_SITE_OTHER): Payer: Medicare Other | Admitting: Family Medicine

## 2017-04-17 ENCOUNTER — Encounter: Payer: Self-pay | Admitting: Family Medicine

## 2017-04-17 VITALS — BP 118/78 | HR 70 | Temp 98.3°F | Ht 66.0 in | Wt 253.0 lb

## 2017-04-17 DIAGNOSIS — I10 Essential (primary) hypertension: Secondary | ICD-10-CM | POA: Diagnosis not present

## 2017-04-17 DIAGNOSIS — F1010 Alcohol abuse, uncomplicated: Secondary | ICD-10-CM

## 2017-04-17 DIAGNOSIS — F411 Generalized anxiety disorder: Secondary | ICD-10-CM

## 2017-04-17 DIAGNOSIS — F4323 Adjustment disorder with mixed anxiety and depressed mood: Secondary | ICD-10-CM | POA: Diagnosis not present

## 2017-04-17 MED ORDER — BENAZEPRIL-HYDROCHLOROTHIAZIDE 20-25 MG PO TABS: 1.0000 | ORAL_TABLET | Freq: Every day | ORAL | 1 refills | Status: DC

## 2017-04-17 MED ORDER — FLUOXETINE HCL 10 MG PO CAPS: ORAL_CAPSULE | ORAL | 1 refills | Status: DC

## 2017-04-17 MED ORDER — CARVEDILOL 3.125 MG PO TABS: ORAL_TABLET | ORAL | 1 refills | Status: DC

## 2017-04-17 NOTE — Assessment & Plan Note (Signed)
Continue Prozac at current dose  RFs prn  F/up 4-6 mo for this

## 2017-04-17 NOTE — Assessment & Plan Note (Signed)
>>  ASSESSMENT AND PLAN FOR ADJUSTMENT DISORDER WITH MIXED ANXIETY AND DEPRESSED MOOD WRITTEN ON 04/17/2017 12:31 PM BY OPALSKI, DEBORAH, DO  Continue Prozac at current dose  RFs prn  F/up 4-6 mo for this

## 2017-04-17 NOTE — Assessment & Plan Note (Signed)
Asked to cut back by 50% -  Discussed with patient detrimental effects of excess alcohol to her health.

## 2017-04-17 NOTE — Patient Instructions (Addendum)
Please make a follow-up in the future for a complete physical exam so we can make sure all your health maintenance screenings/tests are up to date.  This is in addition to your chronic care that we see you for.  If you could track down when your last mammogram/ PAP and colonoscopy was in get [place] those medical records that would be great.  Also, information for when the last time you had a pneumonia vaccine       Nine ways to increase your "good" HDL cholesterol  High-density lipoprotein (HDL) is often referred to as the "good" cholesterol. Having high HDL levels helps carry cholesterol from your arteries to your liver, where it can be used or excreted.  Having high levels of HDL also has antioxidant and anti-inflammatory effects, and is linked to a reduced risk of heart disease (1, 2).  Most health experts recommend minimum blood levels of 40 mg/dl in men and 50 mg/dl in women.  While genetics definitely play a role, there are several other factors that affect HDL levels.  Here are nine healthy ways to raise your "good" HDL cholesterol.  1. Consume olive oil  two pieces of salmon on a plate olive oil being poured into a small dish Extra virgin olive oil may be more healthful than processed olive oils. Olive oil is one of the healthiest fats around.  A large analysis of 42 studies with more than 800,000 participants found that olive oil was the only source of monounsaturated fat that seemed to reduce heart disease risk (3).  Research has shown that one of olive oil's heart-healthy effects is an increase in HDL cholesterol. This effect is thought to be caused by antioxidants it contains called polyphenols (4, 5, 6, 7).  Extra virgin olive oil has more polyphenols than more processed olive oils, although the amount can still vary among different types and brands.  One study gave 200 healthy young men about 2 tablespoons (25 ml) of different olive oils per day for three weeks.  The  researchers found that participants' HDL levels increased significantly more after they consumed the olive oil with the highest polyphenol content (6).  In another study, when 71 older adults consumed about 4 tablespoons (50 ml) of high-polyphenol extra virgin olive oil every day for six weeks, their HDL cholesterol increased by 6.5 mg/dl, on average (7).  In addition to raising HDL levels, olive oil has been found to boost HDL's anti-inflammatory and antioxidant function in studies of older people and individuals with high cholesterol levels ( 7, 8, 9).  Whenever possible, select high-quality, certified extra virgin olive oils, which tend to be highest in polyphenols.  Bottom line: Extra virgin olive oil with a high polyphenol content has been shown to increase HDL levels in healthy people, the elderly and individuals with high cholesterol.  2. Follow a low-carb or ketogenic diet  Low-carb and ketogenic diets provide a number of health benefits, including weight loss and reduced blood sugar levels.  They have also been shown to increase HDL cholesterol in people who tend to have lower levels.  This includes those who are obese, insulin resistant or diabetic (10, 11, 12, 13, 14, 15, 16, 17).  In one study, people with type 2 diabetes were split into two groups.  One followed a diet consuming less than 50 grams of carbs per day. The other followed a high-carb diet.  Although both groups lost weight, the low-carb group's HDL cholesterol increased almost twice as much as  the high-carb group's did (14).  In another study, obese people who followed a low-carb diet experienced an increase in HDL cholesterol of 5 mg/dl overall.  Meanwhile, in the same study, the participants who ate a low-fat, high-carb diet showed a decrease in HDL cholesterol (15).  This response may partially be due to the higher levels of fat people typically consume on low-carb diets.  One study in overweight women found  that diets high in meat and cheese increased HDL levels by 5-8%, compared to a higher-carb diet (18).  What's more, in addition to raising HDL cholesterol, very-low-carb diets have been shown to decrease triglycerides and improve several other risk factors for heart disease (13, 14, 16, 17).  Bottom line: Low-carb and ketogenic diets typically increase HDL cholesterol levels in people with diabetes, metabolic syndrome and obesity.  3. Exercise regularly  Being physically active is important for heart health.  Studies have shown that many different types of exercise are effective at raising HDL cholesterol, including strength training, high-intensity exercise and aerobic exercise (19, 20, 21, 22, 23, 24).  However, the biggest increases in HDL are typically seen with high-intensity exercise.  One small study followed women who were living with polycystic ovary syndrome (PCOS), which is linked to a higher risk of insulin resistance. The study required them to perform high-intensity exercise three times a week.  The exercise led to an increase in HDL cholesterol of 8 mg/dL after 10 weeks. The women also showed improvements in other health markers, including decreased insulin resistance and improved arterial function (23).  In a 12-week study, overweight men who performed high-intensity exercise experienced a 10% increase in HDL cholesterol.  In contrast, the low-intensity exercise group showed only a 2% increase and the endurance training group experienced no change (24).  However, even lower-intensity exercise seems to increase HDL's anti-inflammatory and antioxidant capacities, whether or not HDL levels change (20, 21, 25).  Overall, high-intensity exercise such as high-intensity interval training (HIIT) and high-intensity circuit training (HICT) may boost HDL cholesterol levels the most.  Bottom line: Exercising several times per week can help raise HDL cholesterol and enhance its  anti-inflammatory and antioxidant effects. High-intensity forms of exercise may be especially effective.  4. Add coconut oil to your diet  Studies have shown that coconut oil may reduce appetite, increase metabolic rate and help protect brain health, among other benefits.  Some people may be concerned about coconut oil's effects on heart health due to its high saturated fat content.  However, it appears that coconut oil is actually quite heart healthy.  Coconut oil tends to raise HDL cholesterol more than many other types of fat.  In addition, it may improve the ratio of low-density-lipoprotein (LDL) cholesterol, the "bad" cholesterol, to HDL cholesterol. Improving this ratio reduces heart disease risk (26, 27, 28, 29).  One study examined the health effects of coconut oil on 40 women with excess belly fat. The researchers found that participants who took coconut oil daily experienced increased HDL cholesterol and a lower LDL-to-HDL ratio.  In contrast, the group who took soybean oil daily had a decrease in HDL cholesterol and an increase in the LDL-to-HDL ratio (29).  Most studies have found these health benefits occur at a dosage of about 2 tablespoons (30 ml) of coconut oil per day. It's best to incorporate this into cooking rather than eating spoonfuls of coconut oil on their own.  Bottom line: Consuming 2 tablespoons (30 ml) of coconut oil per day may help  increase HDL cholesterol levels.  5. Stop smoking  cigarette butt Quitting smoking can reduce the risk of heart disease and lung cancer. Smoking increases the risk of many health problems, including heart disease and lung cancer (30).  One of its negative effects is a suppression of HDL cholesterol.  Some studies have found that quitting smoking can increase HDL levels. Indeed, one study found no significant differences in HDL levels between former smokers and people who had never smoked (31, 32, 33, 34, 35).  In a one-year  study of more than 1,500 people, those who quit smoking had twice the increase in HDL as those who resumed smoking within the year. The number of large HDL particles also increased, which further reduced heart disease risk (32).  One study followed smokers who switched from traditional cigarettes to electronic cigarettes for one year. They found that the switch was associated with an increase in HDL cholesterol of 5 mg/dl, on average (33).  When it comes to the effect of nicotine replacement patches on HDL levels, research results have been mixed.  One study found that nicotine replacement therapy led to higher HDL cholesterol. However, other research suggests that people who use nicotine patches likely won't see increases in HDL levels until after replacement therapy is completed (34, 36).  Even in studies where HDL cholesterol levels didn't increase after people quit smoking, HDL function improved, resulting in less inflammation and other beneficial effects on heart health (37).  Bottom line: Quitting smoking can increase HDL levels, improve HDL function and help protect heart health.  6. Lose weight  When overweight and obese people lose weight, their HDL cholesterol levels usually increase.  What's more, this benefit seems to occur whether weight loss is achieved by calorie counting, carb restriction, intermittent fasting, weight loss surgery or a combination of diet and exercise (16, 38, 39, 40, 41, 42).  One study examined HDL levels in more than 3,000 overweight and obese [place] adults who followed a lifestyle modification program for one year.  The researchers found that losing at least 6.6 lbs (3 kg) led to an increase in HDL cholesterol of 4 mg/dl, on average (41).  In another study, when obese people with type 2 diabetes consumed calorie-restricted diets that provided 20-30% of calories from protein, they experienced significant increases in HDL cholesterol levels (42).  The key  to achieving and maintaining healthy HDL cholesterol levels is choosing the type of diet that makes it easiest for you to lose weight and keep it off.  Bottom Line: Several methods of weight loss have been shown to increase HDL cholesterol levels in people who are overweight or obese.  7. Choose purple produce  Consuming purple-colored fruits and vegetables is a delicious way to potentially increase HDL cholesterol.  Purple produce contains antioxidants known as anthocyanins.  Studies using anthocyanin extracts have shown that they help fight inflammation, protect your cells from damaging free radicals and may also raise HDL cholesterol levels (43, 44, 45, 46).  In a 24-week study of 58 people with diabetes, those who took an anthocyanin supplement twice a day experienced a 19% increase in HDL cholesterol, on average, along with other improvements in heart health markers (45).  In another study, when people with cholesterol issues took anthocyanin extract for 12 weeks, their HDL cholesterol levels increased by 13.7% (46).  Although these studies used extracts instead of foods, there are several fruits and vegetables that are very high in anthocyanins. These include eggplant, purple corn, red  cabbage, blueberries, blackberries and black raspberries.  Bottom line: Consuming fruits and vegetables rich in anthocyanins may help increase HDL cholesterol levels.  8. Eat fatty fish often  The omega-3 fats in fatty fish provide major benefits to heart health, including a reduction in inflammation and better functioning of the cells that line your arteries (47, 48).  There's some research showing that eating fatty fish or taking fish oil may also help raise low levels of HDL cholesterol (49, 50, 51, 52, 53).  In a study of 33 heart disease patients, participants that consumed fatty fish four times per week experienced an increase in HDL cholesterol levels. The particle size of their HDL also  increased (52).  In another study, overweight men who consumed herring five days a week for six weeks had a 5% increase in HDL cholesterol, compared with their levels after eating lean pork and chicken five days a week (53).  However, there are a few studies that found no increase in HDL cholesterol in response to increased fish or omega-3 supplement intake (54, 55).  In addition to herring, other types of fatty fish that may help raise HDL cholesterol include salmon, sardines, mackerel and anchovies.  Bottom line: Eating fatty fish several times per week may help increase HDL cholesterol levels and provide other benefits to heart health.  9. Avoid artificial trans fats  Artificial trans fats have many negative health effects due to their inflammatory properties (56, 57).  There are two types of trans fats. One kind occurs naturally in animal products, including full-fat dairy.  In contrast, the artificial trans fats found in margarines and processed foods are created by adding hydrogen to unsaturated vegetable and seed oils. These fats are also known as industrial trans fats or partially hydrogenated fats.  Research has shown that, in addition to increasing inflammation and contributing to several health problems, these artificial trans fats may lower HDL cholesterol levels.  In one study, researchers compared how people's HDL levels responded when they consumed different margarines.  The study found that participants' HDL cholesterol levels were 10% lower after consuming margarine containing partially hydrogenated soybean oil, compared to their levels after consuming palm oil (58).  Another controlled study followed 40 adults who had diets high in different types of trans fats.  They found that HDL cholesterol levels in women were significantly lower after they consumed the diet high in industrial trans fats, compared to the diet containing naturally occurring trans fats (59).  To  protect heart health and keep HDL cholesterol in the healthy range, it's best to avoid artificial trans fats altogether.  Bottom line: Artificial trans fats have been shown to lower HDL levels and increase inflammation, compared to other fats.  Take home message  Although your HDL cholesterol levels are partly determined by your genetics, there are many things you can do to naturally increase your own levels.  Fortunately, the practices that raise HDL cholesterol often provide other health benefits as well.

## 2017-04-17 NOTE — Progress Notes (Signed)
Impression and Recommendations:    1. Essential hypertension   2. GAD (generalized anxiety disorder)   3. Adjustment disorder with mixed anxiety and depressed mood   4. Alcohol abuse   5. Morbid obesity (HCC)     HTN (hypertension) Blood pressure improved since starting Coreg in addition to her other medications.  Reminded patient of lifestyle modifications are important in addition to medications.  Weight loss, salt restriction, daily exercise  Elevate feet whenever you are not walking around on them.  Recommend she use TED hose which she declines.  Alcohol abuse Asked to cut back by 50% -  Discussed with patient detrimental effects of excess alcohol to her health.  Adjustment disorder with mixed anxiety and depressed mood Continue Prozac at current dose  RFs prn  F/up 4-6 mo for this    The patient was counseled, risk factors were discussed, anticipatory guidance given.  Meds ordered this encounter  Medications  . benazepril-hydrochlorthiazide (LOTENSIN HCT) 20-25 MG tablet    Sig: Take 1 tablet by mouth daily.    Dispense:  90 tablet    Refill:  1  . carvedilol (COREG) 3.125 MG tablet    Sig: TAKE 1 TABLET BY MOUTH 2 TIMES DAILY WITH A MEAL.    Dispense:  180 tablet    Refill:  1  . FLUoxetine (PROZAC) 10 MG capsule    Sig: TAKE 1 CAPSULE (10 MG TOTAL) BY MOUTH DAILY.    Dispense:  90 capsule    Refill:  1    Gross side effects, risk and benefits, and alternatives of medications and treatment plan in general discussed with patient.  Patient is aware that all medications have potential side effects and we are unable to predict every side effect or drug-drug interaction that may occur.   Patient will call with any questions prior to using medication if they have concerns.  Expresses verbal understanding and consents to current therapy and treatment regimen.  No barriers to understanding were identified.  Red flag symptoms and signs discussed in detail.   Patient expressed understanding regarding what to do in case of emergency\urgent symptoms  Please see AVS handed out to patient at the end of our visit for further patient instructions/ counseling done pertaining to today's office visit.   Return in about 3 months (around 07/18/2017) for compete physical, come fasting-.     Note: This document was prepared using Dragon voice recognition software and may include unintentional dictation errors.  Thomasene Lot 12:32 PM --------------------------------------------------------------------------------------------------------------------------------------------------------------------------------------------------------------------------------------------    Subjective:    CC: No chief complaint on file.   HPI: [name] is a 71 y.o. female who presents to [facility] today for issues as discussed below.   Hypertension:     Patient tolerating the Coreg well.  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  She has no symptoms at all and has been feeling well.    Swelling in her bilateral lower extremities have improved especially since fluid was taken off her bilateral knees under ultrasound guidance by Dr. Karie Schwalbe  Alcohol overuse:    She has not been working on diet or exercise much and is still drinking the same amount she was prior.  Mood:   Prozac is working working extremely well for her.  She feels much more positive about everything and has just improved outlook on life.  She is much more positive, pleasant and  friendly in the office today as well.    She is doing well on 10 mg, and when she went up to 20 mg this wiped out her sex drive and had no improvement in her mood.  She would like to stay on 10 mg.   Wt Readings from Last 3 Encounters:  04/17/17 253 lb (114.8 kg)  01/10/17 255 lb 14.4 oz (116.1 kg)  12/05/16 242 lb (109.8 kg)   BP Readings from Last 3 Encounters:    04/17/17 118/78  01/10/17 112/71  12/05/16 129/73   Pulse Readings from Last 3 Encounters:  04/17/17 70  01/10/17 72  12/05/16 83   BMI Readings from Last 3 Encounters:  04/17/17 40.84 kg/m  01/10/17 41.30 kg/m  12/05/16 39.06 kg/m     Patient Care Team    Relationship Specialty Notifications Start End  Thomasene Lot, DO PCP - General Family Medicine  06/14/16   Monica Becton, MD Consulting Physician Family Medicine  11/08/16    Comment: OA- knee injections; PT     Patient Active Problem List   Diagnosis Date Noted  . Hypertriglyceridemia 09/29/2016    Priority: High  . HLD (hyperlipidemia) 09/29/2016    Priority: High  . Adjustment disorder with mixed anxiety and depressed mood 09/29/2016    Priority: High  . Morbid obesity (HCC) 06/18/2016    Priority: High  . R Congenital Kidney ABN: Ess only has one fxn kidney 06/18/2016    Priority: High  . HTN (hypertension) 04/25/2015    Priority: High  . Alcohol abuse 04/25/2015    Priority: High  . H/O gastric bypass 06/18/2016    Priority: Medium  . Peripheral edema- b/l L Ext w chronic skin changes 06/18/2016    Priority: Medium  . GAD (generalized anxiety disorder) 06/14/2016    Priority: Medium  . UGIB (upper gastrointestinal bleed) 04/25/2015    Priority: Medium  . Acute ulcer at gastrojejunal region- post gastric bypass     Priority: Medium  . Vitamin D deficiency 09/29/2016    Priority: Low  . h/o Iron deficiency anemia 06/14/2016    Priority: Low  . Hypothyroidism 02/13/2016    Priority: Low  . Bilateral lower extremity edema 11/07/2016  . Primary osteoarthritis of both knees 11/02/2016  . History of hysterectomy 06/18/2016  . Insomnia due to alcohol excess and stress 06/18/2016  . Myalgia and myositis 06/14/2016  . Stress at home 02/13/2016  . Distal radius fracture, right 01/15/2013    Past Medical history, Surgical history, Family history, Social history, Allergies and Medications  have been entered into the medical record, reviewed and changed as needed.    Current Meds  Medication Sig  . acetaminophen (TYLENOL) 325 MG tablet Take 650 mg by mouth every 6 (six) hours as needed.  . benazepril-hydrochlorthiazide (LOTENSIN HCT) 20-25 MG tablet Take 1 tablet by mouth daily.  . carvedilol (COREG) 3.125 MG tablet TAKE 1 TABLET BY MOUTH 2 TIMES DAILY WITH A MEAL.  Marland Kitchen Cholecalciferol (VITAMIN D3) 5000 units TABS 5,000 IU OTC vitamin D3 daily.  . diphenhydrAMINE (SOMINEX) 25 MG tablet Take 25 mg by mouth at bedtime as needed for sleep.  . ferrous sulfate 325 (65 FE) MG tablet Take 325 mg by mouth daily with breakfast.  . FLUoxetine (PROZAC) 10 MG capsule TAKE 1 CAPSULE (10 MG TOTAL) BY MOUTH DAILY.  . furosemide (LASIX) 40 MG tablet Take 0.5 tablets (20 mg total) by mouth as needed.  Marland Kitchen ibuprofen (ADVIL,MOTRIN) 600 MG tablet Take  600 mg by mouth 2 (two) times daily.  Marland Kitchen. levothyroxine (SYNTHROID, LEVOTHROID) 150 MCG tablet TAKE 1 TABLET BY MOUTH DAILY.  . magnesium 30 MG tablet Take 30 mg by mouth 2 (two) times a week.   . Melatonin 5 MG CAPS Take 10 mg by mouth at bedtime.   . Multiple Vitamin (MULTIVITAMIN) tablet Take 1 tablet by mouth daily.  . pantoprazole (PROTONIX) 40 MG tablet Take 1 tablet (40 mg total) by mouth 2 (two) times daily. (Patient taking differently: Take 40 mg by mouth daily. )  . [DISCONTINUED] benazepril-hydrochlorthiazide (LOTENSIN HCT) 20-25 MG tablet Take 1 tablet by mouth daily.  . [DISCONTINUED] carvedilol (COREG) 3.125 MG tablet TAKE 1 TABLET BY MOUTH 2 TIMES DAILY WITH A MEAL.  . [DISCONTINUED] FLUoxetine (PROZAC) 10 MG capsule TAKE 1 CAPSULE (10 MG TOTAL) BY MOUTH DAILY.  . [DISCONTINUED] FLUoxetine (PROZAC) 10 MG tablet Take 10 mg by mouth daily.    Allergies:  Allergies  Allergen Reactions  . Cocoa Anaphylaxis  . Red Dye Hives    Rash and Nausea diarrhea  . Lyrica [Pregabalin] Other (See Comments)    Abnormal bleeding  . Sulfa Antibiotics  Rash     Review of Systems: General:   Denies fever, chills, unexplained weight loss.  Optho/Auditory:   Denies visual changes, blurred vision/LOV Respiratory:   Denies wheeze, DOE more than baseline levels.  Cardiovascular:   Denies chest pain, palpitations, new onset peripheral edema  Gastrointestinal:   Denies nausea, vomiting, diarrhea, abd pain.  Genitourinary: Denies dysuria, freq/ urgency, flank pain or discharge from genitals.  Endocrine:     Denies hot or cold intolerance, polyuria, polydipsia. Musculoskeletal:   Denies unexplained myalgias, joint swelling, unexplained arthralgias, gait problems.  Skin:  Denies new onset rash, suspicious lesions Neurological:     Denies dizziness, unexplained weakness, numbness  Psychiatric/Behavioral:   Denies mood changes, suicidal or homicidal ideations, hallucinations    Objective:   Blood pressure 118/78, pulse 70, temperature 98.3 F (36.8 C), height 5\' 6"  (1.676 m), weight 253 lb (114.8 kg), SpO2 96 %. Body mass index is 40.84 kg/m. General:  Well Developed, well nourished, appropriate for stated age.  Neuro:  Alert and oriented,  extra-ocular muscles intact  HEENT:  Normocephalic, atraumatic, neck supple, no carotid bruits appreciated  Skin:  no gross rash, warm, pink. Cardiac:  RRR, S1 S2 Respiratory:  ECTA B/L and A/P, Not using accessory muscles, speaking in full sentences- unlabored. Vascular:  Ext warm, no cyanosis apprec.; cap RF less 2 sec. Psych:  No HI/SI, judgement and insight good, Euthymic mood. Full Affect.

## 2017-04-17 NOTE — Assessment & Plan Note (Signed)
Blood pressure improved since starting Coreg in addition to her other medications.  Reminded patient of lifestyle modifications are important in addition to medications.  Weight loss, salt restriction, daily exercise  Elevate feet whenever you are not walking around on them.  Recommend she use TED hose which she declines.

## 2017-08-24 ENCOUNTER — Other Ambulatory Visit: Payer: Self-pay | Admitting: Adult Health

## 2017-09-25 ENCOUNTER — Ambulatory Visit (INDEPENDENT_AMBULATORY_CARE_PROVIDER_SITE_OTHER): Payer: Medicare Other | Admitting: Family Medicine

## 2017-09-25 ENCOUNTER — Encounter: Payer: Self-pay | Admitting: Family Medicine

## 2017-09-25 ENCOUNTER — Other Ambulatory Visit: Payer: Self-pay

## 2017-09-25 ENCOUNTER — Other Ambulatory Visit: Payer: Medicare Other

## 2017-09-25 VITALS — BP 140/82 | HR 76 | Ht 66.0 in | Wt 256.0 lb

## 2017-09-25 DIAGNOSIS — E2839 Other primary ovarian failure: Secondary | ICD-10-CM

## 2017-09-25 DIAGNOSIS — R609 Edema, unspecified: Secondary | ICD-10-CM

## 2017-09-25 DIAGNOSIS — I10 Essential (primary) hypertension: Secondary | ICD-10-CM

## 2017-09-25 DIAGNOSIS — E039 Hypothyroidism, unspecified: Secondary | ICD-10-CM

## 2017-09-25 DIAGNOSIS — Z9884 Bariatric surgery status: Secondary | ICD-10-CM

## 2017-09-25 DIAGNOSIS — Z1239 Encounter for other screening for malignant neoplasm of breast: Secondary | ICD-10-CM

## 2017-09-25 DIAGNOSIS — R0600 Dyspnea, unspecified: Secondary | ICD-10-CM

## 2017-09-25 DIAGNOSIS — D508 Other iron deficiency anemias: Secondary | ICD-10-CM

## 2017-09-25 DIAGNOSIS — E782 Mixed hyperlipidemia: Secondary | ICD-10-CM

## 2017-09-25 DIAGNOSIS — Z23 Encounter for immunization: Secondary | ICD-10-CM

## 2017-09-25 DIAGNOSIS — E781 Pure hyperglyceridemia: Secondary | ICD-10-CM

## 2017-09-25 DIAGNOSIS — F4323 Adjustment disorder with mixed anxiety and depressed mood: Secondary | ICD-10-CM

## 2017-09-25 DIAGNOSIS — F1010 Alcohol abuse, uncomplicated: Secondary | ICD-10-CM

## 2017-09-25 DIAGNOSIS — R0609 Other forms of dyspnea: Secondary | ICD-10-CM

## 2017-09-25 MED ORDER — CARVEDILOL 3.125 MG PO TABS: ORAL_TABLET | ORAL | 0 refills | Status: DC

## 2017-09-25 MED ORDER — LEVOTHYROXINE SODIUM 150 MCG PO TABS: 150.0000 ug | ORAL_TABLET | Freq: Every day | ORAL | 0 refills | Status: DC

## 2017-09-25 NOTE — Patient Instructions (Signed)

## 2017-09-25 NOTE — Progress Notes (Signed)
Impression and Recommendations:    1. Dyspnea on exertion   2. Essential hypertension   3. Mixed hyperlipidemia   4. Hypertriglyceridemia   5. Morbid obesity (HCC)   6. H/O gastric bypass   7. Adjustment disorder with mixed anxiety and depressed mood   8. Hypothyroidism, unspecified type   9. Alcohol abuse   10. Peripheral edema- b/l L Ext w chronic skin changes   11. Flu vaccine need   12. Screening for breast cancer   13. Estrogen deficiency     Peripheral edema- b/l L Ext w chronic skin changes We discussed and patient wishes to hold off on ABIs or any evaluation of the vascular flow to her lower extremities at this point now.  She states she will discuss with cardiology as well.   The patient was counseled, risk factors were discussed, anticipatory guidance given.  Meds ordered this encounter  Medications  . DISCONTD: carvedilol (COREG) 3.125 MG tablet    Sig: TAKE 1 TABLET BY MOUTH 2 TIMES DAILY WITH A MEAL.    Dispense:  180 tablet    Refill:  0  . DISCONTD: levothyroxine (SYNTHROID, LEVOTHROID) 150 MCG tablet    Sig: Take 1 tablet (150 mcg total) by mouth daily.    Dispense:  90 tablet    Refill:  0    Patient needs appointment for refills  . DISCONTD: levothyroxine (SYNTHROID, LEVOTHROID) 150 MCG tablet    Sig: Take 1 tablet (150 mcg total) by mouth daily.    Dispense:  90 tablet    Refill:  0    Patient needs appointment for refills  . DISCONTD: carvedilol (COREG) 3.125 MG tablet    Sig: TAKE 1 TABLET BY MOUTH 2 TIMES DAILY WITH A MEAL.    Dispense:  180 tablet    Refill:  0    Gross side effects, risk and benefits, and alternatives of medications and treatment plan in general discussed with patient.  Patient is aware that all medications have potential side effects and we are unable to predict every side effect or drug-drug interaction that may occur.   Patient will call with any questions prior to using medication if they have concerns.  Expresses  verbal understanding and consents to current therapy and treatment regimen.  No barriers to understanding were identified.  Red flag symptoms and signs discussed in detail.  Patient expressed understanding regarding what to do in case of emergency\urgent symptoms  Please see AVS handed out to patient at the end of our visit for further patient instructions/ counseling done pertaining to today's office visit.   Return for 8wks or so- reck Exertional Dypsnea, also reck lower ext edema.     Note: This document was prepared using Dragon voice recognition software and may include unintentional dictation errors.  Pt was in the office today for 40+ minutes, with over 50% time spent in face to face counseling of various medical concerns and in coordination of care  Subjective:    CC:  Chief Complaint  Patient presents with  . Follow-up    HPI: [name] is a 71 y.o. female [facility] for issues as discussed below.  Pt had fasting bldwrk today earlier.  Last seen 5\22\18 and was told to follow-up in 3 months for complete physical but patient was lost to follow-up.   -- She has multiple concerns today including filling out forms for clearance for oral  surgery, renewal for disability placard and renewal of all of her medications.  Needs coreg and thyroid meds Refilled.   Acute Concerns:  Pt has complaints of more SOB with exertion.  Husband has noticed it getting worse lately.   Been going on for a year now but past 2 mo much worse than prior and progressively worse.  No CP/ palpitations or Heartburn etc, no exertional sweating, N/V etc, no worsening of her b/l lower ext with her chronic venous insuff.  Patient tells me this as she has concerns since she knows she is going for oral surgery and will need are okay to have it. - never had any cardiac work-up in past  - Pt has been doing less activity and less activity- now using cane at all  times outside of house. She feels unsteady.  No regular exercise. But walks 7-79min 1/wk down her driveway/ cul-de-sac. Using cane with that. No muscle weakness / denies feeling weaker overall.      Hypertension:     Patient tolerating the Coreg well.  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  She has no symptoms at all and has been feeling well.    Swelling in her bilateral lower extremities have improved especially since fluid was taken off her bilateral knees under ultrasound guidance by Dr.    Alcohol overuse:    She has been working on diet a little - some less salt but otherwise no exercise much and is still drinking but less than prior-->  Was 2-3 bottles wine daily maybe now less than 2 bottles per day.      Legs- less swollen than usual and no claudication sx   Mood:  Pt ran out of prozac, doesn't feel she needs it now-  been off it for about 6 mo now or so.   She feels much more positive about everything and has just improved outlook on life.   Sex drive still healthy and feels improved since off the medicine.       Wt Readings from Last 3 Encounters:  12/31/17 266 lb (120.7 kg)  12/27/17 265 lb (120.2 kg)  12/18/17 265 lb (120.2 kg)   BP Readings from Last 3 Encounters:  12/31/17 (!) 150/80  12/27/17 136/79  12/18/17 108/65   Pulse Readings from Last 3 Encounters:  12/31/17 70  12/27/17 85  12/18/17 76   BMI Readings from Last 3 Encounters:  12/31/17 44.26 kg/m  12/27/17 44.10 kg/m  12/18/17 44.10 kg/m     Patient Care Team    Relationship Specialty Notifications Start End  Thomasene Lot, DO PCP - General Family Medicine  06/14/16   Monica Becton, MD Consulting Physician Sports Medicine  11/08/16    Comment: OA- knee injections; PT  Jerolyn Shin, MD Consulting Physician Neurology  09/25/17    Comment: neuropsych- seen him for pinched nerves in neck in past  Marykay Lex, MD Consulting Physician Cardiology   12/03/17      Patient Active Problem List   Diagnosis Date Noted  . Acute pain of right knee 12/18/2017  . Cellulitis of right leg 12/18/2017  . Peripheral neuropathy due to edema b/l feet 12/03/2017  . Decreased pedal pulses 10/27/2017  . Diastolic dysfunction with chronic heart failure (HCC) 10/25/2017  . Aortic systolic murmur on examination 10/02/2017  . Preoperative cardiovascular examination 10/02/2017  . Exertional dyspnea 09/25/2017  . Bilateral lower extremity edema 11/07/2016  . Primary osteoarthritis of  both knees 11/02/2016  . Hypertriglyceridemia 09/29/2016  . HLD (hyperlipidemia) 09/29/2016  . Vitamin D deficiency 09/29/2016  . Adjustment disorder with mixed anxiety and depressed mood 09/29/2016  . Morbid obesity (HCC) 06/18/2016  . R Congenital Kidney ABN: Ess only has one fxn kidney 06/18/2016  . History of hysterectomy 06/18/2016  . H/O gastric bypass 06/18/2016  . Peripheral edema- b/l L Ext w chronic skin changes 06/18/2016  . Insomnia due to alcohol excess and stress 06/18/2016  . GAD (generalized anxiety disorder) 06/14/2016  . h/o Iron deficiency anemia 06/14/2016  . Myalgia and myositis 06/14/2016  . Hypothyroidism 02/13/2016  . Stress at home 02/13/2016  . UGIB (upper gastrointestinal bleed) 04/25/2015  . Hypertension 04/25/2015  . Alcohol abuse 04/25/2015  . Acute ulcer at gastrojejunal region- post gastric bypass   . Distal radius fracture, right 01/15/2013    Past Medical history, Surgical history, Family history, Social history, Allergies and Medications have been entered into the medical record, reviewed and changed as needed.    Current Meds  Medication Sig  . acetaminophen (TYLENOL) 325 MG tablet Take 650 mg by mouth every 6 (six) hours as needed.  . benazepril-hydrochlorthiazide (LOTENSIN HCT) 20-25 MG tablet Take 1 tablet by mouth daily.  . Cholecalciferol (VITAMIN D3) 5000 units TABS 5,000 IU OTC vitamin D3 daily.  . diphenhydrAMINE  (BENADRYL) 25 MG tablet Take 25 mg by mouth daily. Patient takes 1-2 nightly  . ferrous sulfate 325 (65 FE) MG tablet Take 325 mg by mouth daily with breakfast.  . magnesium 30 MG tablet Take 30 mg by mouth 2 (two) times a week. Patient takes prn and takes 1 gram  . Melatonin 5 MG CAPS Take 10 mg by mouth at bedtime.   . Multiple Vitamin (MULTIVITAMIN) tablet Take 1 tablet by mouth daily.  . [DISCONTINUED] carvedilol (COREG) 3.125 MG tablet TAKE 1 TABLET BY MOUTH 2 TIMES DAILY WITH A MEAL.  . [DISCONTINUED] carvedilol (COREG) 3.125 MG tablet TAKE 1 TABLET BY MOUTH 2 TIMES DAILY WITH A MEAL.  . [DISCONTINUED] carvedilol (COREG) 3.125 MG tablet TAKE 1 TABLET BY MOUTH 2 TIMES DAILY WITH A MEAL.  . [DISCONTINUED] diphenhydrAMINE (SOMINEX) 25 MG tablet Take 25 mg by mouth at bedtime as needed for sleep.  . [DISCONTINUED] FLUoxetine (PROZAC) 10 MG capsule TAKE 1 CAPSULE (10 MG TOTAL) BY MOUTH DAILY.  . [DISCONTINUED] furosemide (LASIX) 40 MG tablet Take 0.5 tablets (20 mg total) by mouth as needed. (Patient taking differently: Take 20 mg by mouth as needed. Patient takes 4 times a week)  . [DISCONTINUED] ibuprofen (ADVIL,MOTRIN) 600 MG tablet Take 600 mg by mouth 2 (two) times daily.  . [DISCONTINUED] levothyroxine (SYNTHROID, LEVOTHROID) 150 MCG tablet TAKE 1 TABLET BY MOUTH DAILY.  . [DISCONTINUED] levothyroxine (SYNTHROID, LEVOTHROID) 150 MCG tablet Take 1 tablet (150 mcg total) by mouth daily.  . [DISCONTINUED] levothyroxine (SYNTHROID, LEVOTHROID) 150 MCG tablet Take 1 tablet (150 mcg total) by mouth daily.  . [DISCONTINUED] pantoprazole (PROTONIX) 40 MG tablet Take 1 tablet (40 mg total) by mouth 2 (two) times daily. (Patient taking differently: Take 40 mg by mouth daily. )    Allergies:  Allergies  Allergen Reactions  . Cocoa Anaphylaxis  . Red Dye Hives    Rash and Nausea diarrhea  . Lyrica [Pregabalin] Other (See Comments)    Abnormal bleeding  . Sulfa Antibiotics Rash     Review of  Systems: General:   Denies fever, chills, unexplained weight loss.  Optho/Auditory:   Denies  visual changes, blurred vision/LOV Respiratory:   Denies wheeze, DOE more than baseline levels.  Cardiovascular:   Denies chest pain, palpitations, new onset peripheral edema  Gastrointestinal:   Denies nausea, vomiting, diarrhea, abd pain.  Genitourinary: Denies dysuria, freq/ urgency, flank pain or discharge from genitals.  Endocrine:     Denies hot or cold intolerance, polyuria, polydipsia. Musculoskeletal:   Denies unexplained myalgias, joint swelling, unexplained arthralgias, gait problems.  Skin:  Denies new onset rash, suspicious lesions Neurological:     Denies dizziness, unexplained weakness, numbness  Psychiatric/Behavioral:   Denies mood changes, suicidal or homicidal ideations, hallucinations    Objective:   Blood pressure 140/82, pulse 76, height 5\' 6"  (1.676 m), weight 256 lb (116.1 kg). Body mass index is 41.32 kg/m. General:  Well Developed, well nourished, appropriate for stated age.  Neuro:  Alert and oriented,  extra-ocular muscles intact  HEENT:  Normocephalic, atraumatic, neck supple, no carotid bruits appreciated  Skin:  no gross rash, warm, pink. Cardiac:  RRR, S1 S2 Respiratory:  ECTA B/L and A/P, Not using accessory muscles, speaking in full sentences- unlabored. Vascular:  Ext warm, no cyanosis apprec.; cap RF less 2 sec. Psych:  No HI/SI, judgement and insight good, Euthymic mood. Full Affect.

## 2017-09-25 NOTE — Assessment & Plan Note (Signed)
We discussed and patient wishes to hold off on ABIs or any evaluation of the vascular flow to her lower extremities at this point now.  She states she will discuss with cardiology as well.

## 2017-09-26 ENCOUNTER — Ambulatory Visit (INDEPENDENT_AMBULATORY_CARE_PROVIDER_SITE_OTHER): Payer: Medicare Other | Admitting: Cardiology

## 2017-09-26 ENCOUNTER — Encounter: Payer: Self-pay | Admitting: Cardiology

## 2017-09-26 ENCOUNTER — Other Ambulatory Visit: Payer: Self-pay | Admitting: Family Medicine

## 2017-09-26 VITALS — BP 154/86 | HR 79 | Ht 66.0 in | Wt 253.2 lb

## 2017-09-26 DIAGNOSIS — I358 Other nonrheumatic aortic valve disorders: Secondary | ICD-10-CM | POA: Diagnosis not present

## 2017-09-26 DIAGNOSIS — E782 Mixed hyperlipidemia: Secondary | ICD-10-CM

## 2017-09-26 DIAGNOSIS — Z1231 Encounter for screening mammogram for malignant neoplasm of breast: Secondary | ICD-10-CM

## 2017-09-26 DIAGNOSIS — Z01810 Encounter for preprocedural cardiovascular examination: Secondary | ICD-10-CM | POA: Diagnosis not present

## 2017-09-26 DIAGNOSIS — I10 Essential (primary) hypertension: Secondary | ICD-10-CM

## 2017-09-26 DIAGNOSIS — R0600 Dyspnea, unspecified: Secondary | ICD-10-CM

## 2017-09-26 DIAGNOSIS — R0609 Other forms of dyspnea: Secondary | ICD-10-CM | POA: Diagnosis not present

## 2017-09-26 DIAGNOSIS — R600 Localized edema: Secondary | ICD-10-CM

## 2017-09-26 LAB — COMPREHENSIVE METABOLIC PANEL
ALT: 70 IU/L — ABNORMAL HIGH (ref 0–32)
AST: 72 IU/L — ABNORMAL HIGH (ref 0–40)
Albumin/Globulin Ratio: 1.5 (ref 1.2–2.2)
Albumin: 3.7 g/dL (ref 3.5–4.8)
Alkaline Phosphatase: 118 IU/L — ABNORMAL HIGH (ref 39–117)
BUN/Creatinine Ratio: 15 (ref 12–28)
BUN: 13 mg/dL (ref 8–27)
Bilirubin Total: 0.6 mg/dL (ref 0.0–1.2)
CO2: 26 mmol/L (ref 20–29)
Calcium: 9.4 mg/dL (ref 8.7–10.3)
Chloride: 98 mmol/L (ref 96–106)
Creatinine, Ser: 0.87 mg/dL (ref 0.57–1.00)
GFR calc Af Amer: 78 mL/min/{1.73_m2} (ref >59)
GFR calc non Af Amer: 68 mL/min/{1.73_m2} (ref >59)
Globulin, Total: 2.5 g/dL (ref 1.5–4.5)
Glucose: 113 mg/dL — ABNORMAL HIGH (ref 65–99)
Potassium: 4.7 mmol/L (ref 3.5–5.2)
Sodium: 137 mmol/L (ref 134–144)
Total Protein: 6.2 g/dL (ref 6.0–8.5)

## 2017-09-26 LAB — LIPID PANEL
Chol/HDL Ratio: 3.3 ratio (ref 0.0–4.4)
Cholesterol, Total: 183 mg/dL (ref 100–199)
HDL: 56 mg/dL (ref >39)
LDL Calculated: 101 mg/dL — ABNORMAL HIGH (ref 0–99)
Triglycerides: 131 mg/dL (ref 0–149)
VLDL Cholesterol Cal: 26 mg/dL (ref 5–40)

## 2017-09-26 LAB — CBC WITH DIFFERENTIAL/PLATELET
Basophils Absolute: 0.1 10*3/uL (ref 0.0–0.2)
Basos: 2 %
EOS (ABSOLUTE): 0.2 10*3/uL (ref 0.0–0.4)
Eos: 4 %
Hematocrit: 37.2 % (ref 34.0–46.6)
Hemoglobin: 12.3 g/dL (ref 11.1–15.9)
Immature Grans (Abs): 0 10*3/uL (ref 0.0–0.1)
Immature Granulocytes: 0 %
Lymphocytes Absolute: 1.9 10*3/uL (ref 0.7–3.1)
Lymphs: 31 %
MCH: 35.7 pg — ABNORMAL HIGH (ref 26.6–33.0)
MCHC: 33.1 g/dL (ref 31.5–35.7)
MCV: 108 fL — ABNORMAL HIGH (ref 79–97)
Monocytes Absolute: 0.7 10*3/uL (ref 0.1–0.9)
Monocytes: 11 %
Neutrophils Absolute: 3.3 10*3/uL (ref 1.4–7.0)
Neutrophils: 52 %
Platelets: 306 10*3/uL (ref 150–379)
RBC: 3.45 x10E6/uL — ABNORMAL LOW (ref 3.77–5.28)
RDW: 14 % (ref 12.3–15.4)
WBC: 6.3 10*3/uL (ref 3.4–10.8)

## 2017-09-26 LAB — HEMOGLOBIN A1C
Est. average glucose Bld gHb Est-mCnc: 114 mg/dL
Hgb A1c MFr Bld: 5.6 % (ref 4.8–5.6)

## 2017-09-26 LAB — TSH: TSH: 1.07 u[IU]/mL (ref 0.450–4.500)

## 2017-09-26 LAB — VITAMIN B12: Vitamin B-12: 244 pg/mL (ref 232–1245)

## 2017-09-26 LAB — VITAMIN D 25 HYDROXY (VIT D DEFICIENCY, FRACTURES): Vit D, 25-Hydroxy: 24.3 ng/mL — ABNORMAL LOW (ref 30.0–100.0)

## 2017-09-26 NOTE — Patient Instructions (Signed)
SCHEDULE AT [address] Your physician has requested that you have an echocardiogram. Echocardiography is a painless test that uses sound waves to create images of your heart. It provides your doctor with information about the size and shape of your heart and how well your heart's chambers and valves are working. This procedure takes approximately one hour. There are no restrictions for this procedure.   SCHEDULE AT [address] Your physician has requested that you have a lexiscan myoview. For further information please visit [email or web address]. Please follow instruction sheet, as given.   Your physician recommends that you schedule a follow-up appointment in 3-4 WEEKS WITH DR HARDING AFTER TEST -PRE OP CLEARANCE.

## 2017-09-26 NOTE — Progress Notes (Signed)
PCP: Thomasene Lot, DO  Clinic Note: Chief Complaint  Patient presents with  . New Patient (Initial Visit)    Shortness of breath with exertion; preop    HPI:  [name] is a 71 y.o. female who is being seen today for the evaluation of progressively worsening exertional dyspnea, and edema with upcoming surgery at the request of Thomasene Lot, DO. She has hypertension and hypothyroidism.  She has lower extremity edema for which she takes Lasix.  Family history is notable for significant hypertension with her father and nephrolithiasis in her children.  Her mother however lived into her 81s and had a stroke in her 41s.  SHYLO ZAMOR was just seen by her PCP to discuss concerns of clearance for her upcoming oral surgery.  She was noticing exertional dyspnea and apparently has been getting worse lately.  Recent Hospitalizations: None  Studies Personally Reviewed - (if available, images/films reviewed: From Epic Chart or Care Everywhere)  Bilateral ABIs June 2016: R ABI 1.1, L ABI 1.0; R TBI 0.52, L TBI 0.45 (both abnormal)  Lower extremity venous Dopplers May 2016: No evidence of deep or superficial venous thrombosis or incompetence.  Possible Baker's cyst bilaterally  Interval History: [name] presents here today noting that she has had progressively worsening shortness of breath now it is happening with simple things such as carrying her groceries into her house.  She gets short of breath walking up stairs or doing any type of exertional activity.  She denies any resting dyspnea, PND or orthopnea.  She does have lower extremity edema.  If she were to exert herself beyond the level of when she starts getting short of breath, she does note that it is hard to breathe and she does not exclude there being a possibility of having some chest discomfort.  She says that the symptoms have been getting worse over the last couple months, but have been progressive for the last 6  months.  Remainder of cardiac review of symptoms:  No palpitations, lightheadedness, dizziness, weakness or syncope/near syncope. No TIA/amaurosis fugax symptoms. No claudication.  She is dealing with significant tooth pain and supposed to be having some dental surgery planned.  She thinks she could delay if necessary.  ROS: A comprehensive was performed. Review of Systems  Constitutional: Negative for malaise/fatigue.  HENT: Negative for congestion and nosebleeds.        Mouth/dental pain.  Has pending oral surgery --> she needs to have teeth pulled.  Respiratory: Positive for shortness of breath. Negative for cough and wheezing.   Cardiovascular:       Per HPI  Gastrointestinal: Negative for blood in stool, constipation, heartburn and melena.  Genitourinary: Negative for dysuria, hematuria and urgency.  Musculoskeletal: Positive for joint pain (Normal arthritis pain).  Neurological: Negative for dizziness and weakness.  Psychiatric/Behavioral: Negative for memory loss. The patient is not nervous/anxious and does not have insomnia.   All other systems reviewed and are negative.  PAD Screen 09/26/2017  Previous PAD dx? No  Previous surgical procedure? No  Pain with walking? No  Feet/toe relief with dangling? No  Painful, non-healing ulcers? No  Extremities discolored? No    I have reviewed and (if needed) personally updated the patient's problem list, medications, allergies, past medical and surgical history, social and family history.   Past Medical History:  Diagnosis Date  . Alcohol abuse 04/25/2015  . Allergy   . Anastomotic ulcer S/P gastric bypass 06/18/2016  . Arthritis   .  Cataract   . Degenerative disc disease, cervical   . GAD (generalized anxiety disorder) 06/14/2016  . HTN (hypertension) 04/25/2015  . Hypertension   . Hypothyroidism   . 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 Substance abuse (HCC)    Alcohol  abuse 2016  . UGIB (upper gastrointestinal bleed) 04/25/2015    Past Surgical History:  Procedure Laterality Date  . CHOLECYSTECTOMY    . COSMETIC SURGERY    . DIAGNOSTIC LAPAROSCOPY    . FRACTURE SURGERY    . GASTRIC BYPASS      Current Meds  Medication Sig  . acetaminophen (TYLENOL) 325 MG tablet Take 650 mg by mouth every 6 (six) hours as needed.  . benazepril-hydrochlorthiazide (LOTENSIN HCT) 20-25 MG tablet Take 1 tablet by mouth daily.  . carvedilol (COREG) 3.125 MG tablet TAKE 1 TABLET BY MOUTH 2 TIMES DAILY WITH A MEAL.  Marland Kitchen Cholecalciferol (VITAMIN D3) 5000 units TABS 5,000 IU OTC vitamin D3 daily.  . cyanocobalamin (,VITAMIN B-12,) 1000 MCG/ML injection Inject 1,000 mcg into the muscle every 30 (thirty) days. Patient injects at home  . diphenhydrAMINE (BENADRYL) 25 MG tablet Take 25 mg by mouth daily. Patient takes 1-2 nightly  . ferrous sulfate 325 (65 FE) MG tablet Take 325 mg by mouth daily with breakfast.  . furosemide (LASIX) 40 MG tablet Take 0.5 tablets (20 mg total) by mouth as needed. (Patient taking differently: Take 20 mg by mouth as needed. Patient takes 4 times a week)  . ibuprofen (ADVIL,MOTRIN) 600 MG tablet Take 600 mg by mouth 2 (two) times daily.  Marland Kitchen levothyroxine (SYNTHROID, LEVOTHROID) 150 MCG tablet Take 1 tablet (150 mcg total) by mouth daily.  . magnesium 30 MG tablet Take 30 mg by mouth 2 (two) times a week. Patient takes prn and takes 1 gram  . Melatonin 5 MG CAPS Take 10 mg by mouth at bedtime.   . Multiple Vitamin (MULTIVITAMIN) tablet Take 1 tablet by mouth daily.  . pantoprazole (PROTONIX) 40 MG tablet Take 1 tablet (40 mg total) by mouth 2 (two) times daily. (Patient taking differently: Take 40 mg by mouth daily. )  . [DISCONTINUED] diphenhydrAMINE (SOMINEX) 25 MG tablet Take 25 mg by mouth at bedtime as needed for sleep.    Allergies  Allergen Reactions  . Cocoa Anaphylaxis  . Red Dye Hives    Rash and Nausea diarrhea  . Lyrica [Pregabalin]  Other (See Comments)    Abnormal bleeding  . Sulfa Antibiotics Rash    Social History   Socioeconomic History  . Marital status: Married    Spouse name: None  . Number of children: None  . Years of education: None  . Highest education level: None  Social Needs  . Financial resource strain: None  . Food insecurity - worry: None  . Food insecurity - inability: None  . Transportation needs - medical: None  . Transportation needs - non-medical: None  Occupational History  . Occupation: Charity fundraiser    Comment: ICU Sales executive  Tobacco Use  . Smoking status: Never Smoker  . Smokeless tobacco: Never Used  Substance and Sexual Activity  . Alcohol use: Yes    Alcohol/week: 16.8 oz    Types: 28 Glasses of wine per week    Comment: drinks daily and has a bottle of wine per day  . Drug use: No  . Sexual activity: Yes    Birth control/protection: Post-menopausal  Other Topics Concern  .  None  Social History Narrative   Marital status: married      Employment: ICU Sales executive - retired      Alcohol: drinks bottle of wine daily in 2016   Takes care of her 64 y/o mom at home    family history includes COPD in her father; Cancer in her father, mother, and paternal grandmother; Diabetes in her father; Heart disease in her mother; Hypertension in her father and mother; Mental illness in her maternal grandmother; Stroke in her maternal grandmother; Stroke (age of onset: 22) in her mother.  Wt Readings from Last 3 Encounters:  09/26/17 253 lb 3.2 oz (114.9 kg)  09/25/17 256 lb (116.1 kg)  04/17/17 253 lb (114.8 kg)    PHYSICAL EXAM BP (!) 154/86   Pulse 79   Ht 5\' 6"  (1.676 m)   Wt 253 lb 3.2 oz (114.9 kg)   SpO2 98%   BMI 40.87 kg/m  Physical Exam  Constitutional: She is oriented to person, place, and time. She appears well-developed and well-nourished. No distress.  Healthy-appearing.  Well-groomed  HENT:  Head: Normocephalic and atraumatic.  Mouth/Throat: No oropharyngeal  exudate.  Poor dentition  Eyes: Conjunctivae and EOM are normal. Pupils are equal, round, and reactive to light. No scleral icterus.  Neck: Normal range of motion. Neck supple. No hepatojugular reflux and no JVD present. Carotid bruit is not present.  Cardiovascular: Normal rate, regular rhythm, intact distal pulses and normal pulses.  No extrasystoles are present. PMI is not displaced. Exam reveals no gallop.  Murmur heard.  Medium-pitched harsh crescendo-decrescendo early systolic murmur is present with a grade of 1/6 at the upper right sternal border. Pulmonary/Chest: Effort normal and breath sounds normal. No respiratory distress. She has no wheezes. She has no rales.  Abdominal: Soft. Bowel sounds are normal. She exhibits no distension. There is no tenderness. There is no rebound.  Musculoskeletal: Normal range of motion. She exhibits edema (1-2+ bilateral LE). She exhibits no deformity.  Lymphadenopathy:    She has no cervical adenopathy.  Neurological: She is alert and oriented to person, place, and time. No cranial nerve deficit.  Skin: Skin is warm and dry. No rash noted. No erythema.  Mild bilateral lower extremity venous stasis varicose veins/spider veins.  Psychiatric: She has a normal mood and affect. Her behavior is normal. Judgment and thought content normal.  Nursing note and vitals reviewed.    Adult ECG Report  Rate: 79 ;  Rhythm: normal sinus rhythm and Normal axis, intervals and durations;   Narrative Interpretation: Normal EKG   Other studies Reviewed: Additional studies/ records that were reviewed today include:  Recent Labs:   Lab Results  Component Value Date   CREATININE 0.87 09/25/2017   BUN 13 09/25/2017   NA 137 09/25/2017   K 4.7 09/25/2017   CL 98 09/25/2017   CO2 26 09/25/2017   Lab Results  Component Value Date   CHOL 183 09/25/2017   HDL 56 09/25/2017   LDLCALC 101 (H) 09/25/2017   TRIG 131 09/25/2017   CHOLHDL 3.3 09/25/2017    ASSESSMENT  / PLAN: Problem List Items Addressed This Visit    Aortic systolic murmur on examination    The murmur does not sound all that significant, however with her upcoming surgery that appears to be a dental surgery, we need to exclude presence of any significant valvular lesions.  Also with her dyspnea it would be nice to know her systolic and diastolic function. Plan: 2D  echocardiogram.      Relevant Orders   EKG 12-Lead (Completed)   ECHOCARDIOGRAM COMPLETE   MYOCARDIAL PERFUSION IMAGING   Bilateral lower extremity edema    She still has dependent lower extremity edema for which she takes Lasix.  The fact that she has venous stasis findings such as the small varicose veins in spider veins makes me suspicious that she may have microvascular venous stasis.  Dopplers 2 years ago did not show significant venous insufficiency, however her physical exam findings were suggested otherwise. Continue to recommend foot elevation and support stockings.  As needed Lasix seems to be helping, but would prefer to avoid Lasix plus HCTZ.  Checking 2D echocardiogram      Relevant Orders   EKG 12-Lead (Completed)   MYOCARDIAL PERFUSION IMAGING   Exertional dyspnea - Primary    Progressively worsening exertional dyspnea associated with some chest tightness.  She also has significant long-standing edema.  The fact that her symptoms have gotten worse over the last few months is quite concerning for potential cardiac etiology. She does have a soft aortic murmur. Plan: Check 2D echocardiogram and Myoview stress test.      Relevant Orders   EKG 12-Lead (Completed)   ECHOCARDIOGRAM COMPLETE   MYOCARDIAL PERFUSION IMAGING   HLD (hyperlipidemia) (Chronic)    Currently for her risk factors she is pretty much at goal.  Depending what the results of her stress tests are, I think we may need to be more aggressive.. She is not currently on any treatment.      HTN (hypertension) (Chronic)    Notably hypertensive  today.  She is little bit anxious.  Blood pressure is a little lower than that yesterday.  She is on ACE inhibitor-HCTZ along with carvedilol 3.25 mg twice daily. -We will see what the results of her stress test looked like to see how aggressive we need to be and if he potentially would increase the dose of carvedilol.      Relevant Orders   EKG 12-Lead (Completed)   Preoperative cardiovascular examination    With her actively having exertional dyspnea we are evaluating with an echocardiogram and stress test.  Pending these results, we can determine safety for surgery.  It is difficult to tell whether her exertional dyspnea is equivalent, and therefore would need to be excluded prior to giving an accurate preoperative risk assessment.      Relevant Orders   EKG 12-Lead (Completed)   ECHOCARDIOGRAM COMPLETE   MYOCARDIAL PERFUSION IMAGING      Current medicines are reviewed at length with the patient today. (+/- concerns) n/a The following changes have been made: n/a  Patient Instructions  SCHEDULE AT [address] Your physician has requested that you have an echocardiogram. Echocardiography is a painless test that uses sound waves to create images of your heart. It provides your doctor with information about the size and shape of your heart and how well your heart's chambers and valves are working. This procedure takes approximately one hour. There are no restrictions for this procedure.   SCHEDULE AT [address] Your physician has requested that you have a lexiscan myoview. For further information please visit [email or web address] Please follow instruction sheet, as given.   Your physician recommends that you schedule a follow-up appointment in 3-4 WEEKS WITH DR HARDING AFTER TEST -PRE OP CLEARANCE.   Studies Ordered:   Orders Placed This Encounter  Procedures  . MYOCARDIAL PERFUSION IMAGING  . EKG  12-Lead  . ECHOCARDIOGRAM COMPLETE       Bryan Lemmaavid Harding, M.D., M.S. Interventional Cardiologist   Pager # [phone] Phone # [phone] [address] [place], [place] [id number]

## 2017-10-01 ENCOUNTER — Telehealth: Payer: Self-pay | Admitting: Family Medicine

## 2017-10-01 ENCOUNTER — Ambulatory Visit
Admission: RE | Admit: 2017-10-01 | Discharge: 2017-10-01 | Disposition: A | Discharge status: Home / Self Care | Payer: Medicare Other | Source: Ambulatory Visit | Attending: Family Medicine | Admitting: Family Medicine

## 2017-10-01 DIAGNOSIS — Z780 Asymptomatic menopausal state: Secondary | ICD-10-CM | POA: Diagnosis not present

## 2017-10-01 DIAGNOSIS — Z13820 Encounter for screening for osteoporosis: Secondary | ICD-10-CM | POA: Diagnosis not present

## 2017-10-01 DIAGNOSIS — E2839 Other primary ovarian failure: Secondary | ICD-10-CM

## 2017-10-01 NOTE — Telephone Encounter (Signed)
Patient states Rx should have been sent to [facility] instead  (NOT)- [facility]--pls forward Rx to correct pharmacy. --glh

## 2017-10-02 ENCOUNTER — Telehealth (HOSPITAL_COMMUNITY): Payer: Self-pay | Admitting: *Deleted

## 2017-10-02 ENCOUNTER — Encounter: Payer: Self-pay | Admitting: Cardiology

## 2017-10-02 DIAGNOSIS — Z01810 Encounter for preprocedural cardiovascular examination: Secondary | ICD-10-CM | POA: Insufficient documentation

## 2017-10-02 DIAGNOSIS — I358 Other nonrheumatic aortic valve disorders: Secondary | ICD-10-CM | POA: Insufficient documentation

## 2017-10-02 DIAGNOSIS — R011 Cardiac murmur, unspecified: Secondary | ICD-10-CM | POA: Insufficient documentation

## 2017-10-02 NOTE — Telephone Encounter (Signed)
Called patient, per the chart medications refilled at last office visit on 09/25/2017 were sent to [facility].  Left message for patient to call the office.  MPulliam, CMA/RT(R)

## 2017-10-02 NOTE — Assessment & Plan Note (Signed)
Currently for her risk factors she is pretty much at goal.  Depending what the results of her stress tests are, I think we may need to be more aggressive.. She is not currently on any treatment.

## 2017-10-02 NOTE — Telephone Encounter (Signed)
Left message on voicemail per DPR in reference to upcoming appointment scheduled on 10/08/17 with detailed instructions given per Myocardial Perfusion Study Information Sheet for the test. LM to arrive 15 minutes early, and that it is imperative to arrive on time for appointment to keep from having the test rescheduled. If you need to cancel or reschedule your appointment, please call the office within 24 hours of your appointment. Failure to do so may result in a cancellation of your appointment, and a $50 no show fee. Phone number given for call back for any questions. Tegan Burnside Jacqueline    

## 2017-10-02 NOTE — Assessment & Plan Note (Signed)
With her actively having exertional dyspnea we are evaluating with an echocardiogram and stress test.  Pending these results, we can determine safety for surgery.  It is difficult to tell whether her exertional dyspnea is equivalent, and therefore would need to be excluded prior to giving an accurate preoperative risk assessment.

## 2017-10-02 NOTE — Assessment & Plan Note (Signed)
She still has dependent lower extremity edema for which she takes Lasix.  The fact that she has venous stasis findings such as the small varicose veins in spider veins makes me suspicious that she may have microvascular venous stasis.  Dopplers 2 years ago did not show significant venous insufficiency, however her physical exam findings were suggested otherwise. Continue to recommend foot elevation and support stockings.  As needed Lasix seems to be helping, but would prefer to avoid Lasix plus HCTZ.  Checking 2D echocardiogram

## 2017-10-02 NOTE — Assessment & Plan Note (Signed)
The murmur does not sound all that significant, however with her upcoming surgery that appears to be a dental surgery, we need to exclude presence of any significant valvular lesions.  Also with her dyspnea it would be nice to know her systolic and diastolic function. Plan: 2D echocardiogram.

## 2017-10-02 NOTE — Assessment & Plan Note (Signed)
Progressively worsening exertional dyspnea associated with some chest tightness.  She also has significant long-standing edema.  The fact that her symptoms have gotten worse over the last few months is quite concerning for potential cardiac etiology. She does have a soft aortic murmur. Plan: Check 2D echocardiogram and Myoview stress test.

## 2017-10-02 NOTE — Assessment & Plan Note (Signed)
Notably hypertensive today.  She is little bit anxious.  Blood pressure is a little lower than that yesterday.  She is on ACE inhibitor-HCTZ along with carvedilol 3.25 mg twice daily. -We will see what the results of her stress test looked like to see how aggressive we need to be and if he potentially would increase the dose of carvedilol.

## 2017-10-03 ENCOUNTER — Telehealth: Payer: Self-pay | Admitting: Family Medicine

## 2017-10-03 NOTE — Telephone Encounter (Signed)
Spoke to the patient and notified her that medication refills were sent to [facility], she verified that the medication was at [facility]. MPulliam, CMA/RT(R)

## 2017-10-03 NOTE — Telephone Encounter (Signed)
Patient left VM stating she missed your call and that you can call her today at your earliest convenience

## 2017-10-03 NOTE — Telephone Encounter (Signed)
Spoke to the patient and notified her that medication refills were sent to [facility], she verified that the medication was at [facility]. MPulliam, CMA/RT(R)  

## 2017-10-04 ENCOUNTER — Ambulatory Visit (HOSPITAL_COMMUNITY): Payer: Medicare Other | Attending: Cardiology

## 2017-10-04 ENCOUNTER — Other Ambulatory Visit: Payer: Self-pay

## 2017-10-04 DIAGNOSIS — Z01810 Encounter for preprocedural cardiovascular examination: Secondary | ICD-10-CM | POA: Diagnosis not present

## 2017-10-04 DIAGNOSIS — R0609 Other forms of dyspnea: Secondary | ICD-10-CM

## 2017-10-04 DIAGNOSIS — E785 Hyperlipidemia, unspecified: Secondary | ICD-10-CM | POA: Insufficient documentation

## 2017-10-04 DIAGNOSIS — R011 Cardiac murmur, unspecified: Secondary | ICD-10-CM | POA: Diagnosis not present

## 2017-10-04 DIAGNOSIS — I358 Other nonrheumatic aortic valve disorders: Secondary | ICD-10-CM | POA: Insufficient documentation

## 2017-10-04 DIAGNOSIS — I519 Heart disease, unspecified: Secondary | ICD-10-CM | POA: Insufficient documentation

## 2017-10-04 DIAGNOSIS — I10 Essential (primary) hypertension: Secondary | ICD-10-CM | POA: Insufficient documentation

## 2017-10-04 DIAGNOSIS — I272 Pulmonary hypertension, unspecified: Secondary | ICD-10-CM | POA: Diagnosis not present

## 2017-10-04 DIAGNOSIS — I517 Cardiomegaly: Secondary | ICD-10-CM | POA: Insufficient documentation

## 2017-10-04 DIAGNOSIS — R0600 Dyspnea, unspecified: Secondary | ICD-10-CM

## 2017-10-04 HISTORY — PX: TRANSTHORACIC ECHOCARDIOGRAM: SHX275

## 2017-10-08 ENCOUNTER — Ambulatory Visit (HOSPITAL_COMMUNITY): Payer: Medicare Other

## 2017-10-09 ENCOUNTER — Ambulatory Visit (HOSPITAL_COMMUNITY): Payer: Medicare Other | Attending: Cardiology

## 2017-10-09 DIAGNOSIS — R0609 Other forms of dyspnea: Secondary | ICD-10-CM | POA: Diagnosis not present

## 2017-10-09 DIAGNOSIS — I358 Other nonrheumatic aortic valve disorders: Secondary | ICD-10-CM | POA: Diagnosis not present

## 2017-10-09 DIAGNOSIS — R600 Localized edema: Secondary | ICD-10-CM | POA: Insufficient documentation

## 2017-10-09 DIAGNOSIS — Z01810 Encounter for preprocedural cardiovascular examination: Secondary | ICD-10-CM | POA: Diagnosis not present

## 2017-10-09 DIAGNOSIS — R0600 Dyspnea, unspecified: Secondary | ICD-10-CM

## 2017-10-09 IMAGING — NM NM MISC PROCEDURE
9 series · 54 of 54 positions shown · non-contrast
Comparison: none

[Series 1: wbr_s-proj_st stress · 6.51mm/px · 6 of 512 frames shown (1 of 2)]
[frame 43/512]
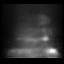
[frame 128/512]
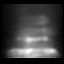
[frame 214/512]
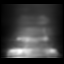
[frame 299/512]
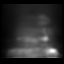
[frame 384/512]
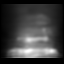
[frame 470/512]
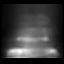

[Series 1: stress · 6.51mm/px · 6 of 64 frames shown (1 of 2)]
[frame 6/64]
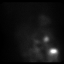
[frame 16/64]
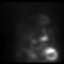
[frame 27/64]
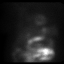
[frame 38/64]
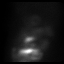
[frame 48/64]
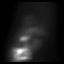
[frame 59/64]
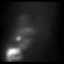

[Series 1: stress · 6.51mm/px · 6 of 512 frames shown (2 of 2)]
[frame 43/512]
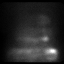
[frame 128/512]
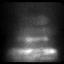
[frame 214/512]
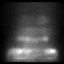
[frame 299/512]
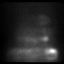
[frame 384/512]
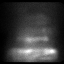
[frame 470/512]
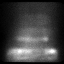

[Series 1: wbr_s-proj_st stress_(id)_sa · 6.5mm · 6.51mm/px · 6 of 64 frames shown (1 of 2)]
[frame 6/64]
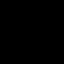
[frame 16/64]
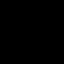
[frame 27/64]
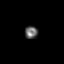
[frame 38/64]
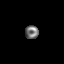
[frame 48/64]
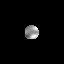
[frame 59/64]
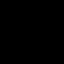

[Series 1: wbr_s-proj_st stress · 6.51mm/px · 6 of 64 frames shown (2 of 2)]
[frame 6/64]
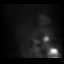
[frame 16/64]
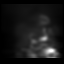
[frame 27/64]
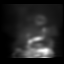
[frame 38/64]
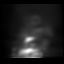
[frame 48/64]
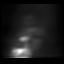
[frame 59/64]
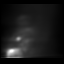

[Series 1: wbr_r-proj_st rest_(id)_sa · 6.5mm · 6.51mm/px · 6 of 64 frames shown]
[frame 6/64]
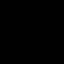
[frame 16/64]
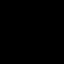
[frame 27/64]
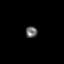
[frame 38/64]
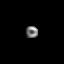
[frame 48/64]
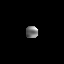
[frame 59/64]
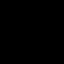

[Series 1: wbr_s-proj_st stress_(id)_sa · 6.5mm · 6.51mm/px · 6 of 512 frames shown (2 of 2)]
[frame 43/512]
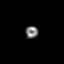
[frame 128/512]
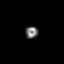
[frame 214/512]
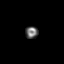
[frame 299/512]
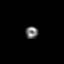
[frame 384/512]
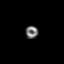
[frame 470/512]
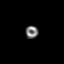

[Series 2: rest · 6.51mm/px · 6 of 64 frames shown]
[frame 6/64]
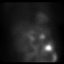
[frame 16/64]
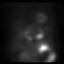
[frame 27/64]
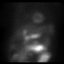
[frame 38/64]
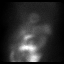
[frame 48/64]
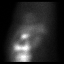
[frame 59/64]
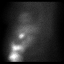

[Series 2: wbr_r-proj_st rest · 6.51mm/px · 6 of 64 frames shown]
[frame 6/64]
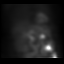
[frame 16/64]
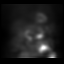
[frame 27/64]
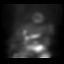
[frame 38/64]
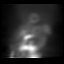
[frame 48/64]
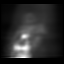
[frame 59/64]
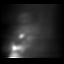

[54 of 54 positions shown; findings below may reference images not displayed]

Canned report from images found in remote index.

Refer to host system for actual result text.

## 2017-10-09 MED ORDER — TECHNETIUM TC 99M TETROFOSMIN IV KIT
1240.0000 | PACK | Freq: Once | INTRAVENOUS | Status: AC | PRN
Start: 2017-10-09 — End: 2017-10-09
  Administered 2017-10-09: 1240 via INTRAVENOUS
  Filled 2017-10-09: qty 1240

## 2017-10-09 MED ORDER — REGADENOSON 0.4 MG/5ML IV SOLN
0.4000 mg | Freq: Once | INTRAVENOUS | Status: AC
  Administered 2017-10-09: 0.4 mg via INTRAVENOUS

## 2017-10-10 ENCOUNTER — Ambulatory Visit (HOSPITAL_COMMUNITY): Payer: Medicare Other | Attending: Cardiology

## 2017-10-10 HISTORY — PX: NM MYOVIEW LTD: HXRAD82

## 2017-10-10 LAB — MYOCARDIAL PERFUSION IMAGING
LV dias vol: 86 mL (ref 46–106)
LV sys vol: 24 mL
Peak HR: 82 {beats}/min
RATE: 0.33
Rest HR: 70 {beats}/min
SDS: 5
SRS: 6
SSS: 11
TID: 0.86

## 2017-10-10 MED ORDER — TECHNETIUM TC 99M TETROFOSMIN IV KIT
32.3000 | PACK | Freq: Once | INTRAVENOUS | Status: AC | PRN
  Administered 2017-10-10: 32.3 via INTRAVENOUS
  Filled 2017-10-10: qty 33

## 2017-10-14 NOTE — Progress Notes (Signed)
Stress Test looked good!! No sign of significant Heart Artery Disease.  Pump function is normal. --As is often the case in women, there is evidence of an artifact that is usually consistent with "breast shadow".  Good news!!.  Marykay LexHARDING,Delesia Martinek W, MD

## 2017-10-24 ENCOUNTER — Ambulatory Visit
Admission: RE | Admit: 2017-10-24 | Discharge: 2017-10-24 | Disposition: A | Discharge status: Home / Self Care | Payer: Medicare Other | Source: Ambulatory Visit | Attending: Family Medicine | Admitting: Family Medicine

## 2017-10-24 DIAGNOSIS — Z1231 Encounter for screening mammogram for malignant neoplasm of breast: Secondary | ICD-10-CM

## 2017-10-25 ENCOUNTER — Encounter: Payer: Self-pay | Admitting: Cardiology

## 2017-10-25 ENCOUNTER — Ambulatory Visit (INDEPENDENT_AMBULATORY_CARE_PROVIDER_SITE_OTHER): Payer: Medicare Other | Admitting: Cardiology

## 2017-10-25 VITALS — BP 126/72 | HR 80 | Ht 65.5 in | Wt 254.6 lb

## 2017-10-25 DIAGNOSIS — R0989 Other specified symptoms and signs involving the circulatory and respiratory systems: Secondary | ICD-10-CM

## 2017-10-25 DIAGNOSIS — Z01810 Encounter for preprocedural cardiovascular examination: Secondary | ICD-10-CM

## 2017-10-25 DIAGNOSIS — R0600 Dyspnea, unspecified: Secondary | ICD-10-CM

## 2017-10-25 DIAGNOSIS — I358 Other nonrheumatic aortic valve disorders: Secondary | ICD-10-CM

## 2017-10-25 DIAGNOSIS — I5032 Chronic diastolic (congestive) heart failure: Secondary | ICD-10-CM | POA: Insufficient documentation

## 2017-10-25 DIAGNOSIS — R600 Localized edema: Secondary | ICD-10-CM

## 2017-10-25 DIAGNOSIS — E782 Mixed hyperlipidemia: Secondary | ICD-10-CM

## 2017-10-25 DIAGNOSIS — I10 Essential (primary) hypertension: Secondary | ICD-10-CM | POA: Diagnosis not present

## 2017-10-25 DIAGNOSIS — R0609 Other forms of dyspnea: Secondary | ICD-10-CM | POA: Diagnosis not present

## 2017-10-25 HISTORY — DX: Chronic diastolic (congestive) heart failure: I50.32

## 2017-10-25 MED ORDER — FUROSEMIDE 40 MG PO TABS: 20.0000 mg | ORAL_TABLET | Freq: Every day | ORAL | 3 refills | Status: DC

## 2017-10-25 MED ORDER — CARVEDILOL 6.25 MG PO TABS: 6.2500 mg | ORAL_TABLET | Freq: Two times a day (BID) | ORAL | 3 refills | Status: DC

## 2017-10-25 NOTE — Progress Notes (Signed)
PCP: Thomasene Lotpalski, Deborah, DO  Clinic Note: Chief Complaint  Patient presents with  . Follow-up    Exertional dyspnea -Myoview and echo    HPI:  [name] is a 71 y.o. female who is being seen today for follow-up evaluation of progressively worsening exertional dyspnea, and edema with upcoming surgery at the request of Thomasene LotOpalski, Deborah, DO. She has hypertension and hypothyroidism.  She has lower extremity edema for which she takes Lasix.  Family history is notable for significant hypertension with her father and nephrolithiasis in her children.  Her mother however lived into her 45100s and had a stroke in her 3790s. She was referred by her PCP to discuss concerns of clearance for her upcoming oral surgery.  She was noticing exertional dyspnea and apparently has been getting worse lately.  [name] BarriosLynda H [name] was initially seen on October 31 for preop evaluation for progressively worsening dyspnea.  2D echocardiogram and Myoview ordered  Recent Hospitalizations: None  Studies Personally Reviewed - (if available, images/films reviewed: From Epic Chart or Care Everywhere)  2D echo October 04, 2017: Mild LVH.  EF 60-65%.  Grade 2/moderate diastolic dysfunction.  Mild pulmonary hypertension.  No valve lesions  Myoview stress test October 10, 2017: Normal LV size and function EF 72%.  Medium sized mild severity defect in the apical septal apical lateral and apical wall suggestive of breast attenuation.  LOW RISK.  No ischemia or infarction.    Interval History: [name] presents here today noting that she still has exertional dyspnea and easy.  She is not having any chest tightness or pressure.  She denies any real PND orthopnea, does have mild edema and has her chronic almost purple discoloration on her feet with aching. She does note though that having started on Lasix, her dyspnea has notably improved.  Still not where she would like to be, but also to be some of this to deconditioning and  obesity. No chest tightness pressure at rest or exertion. No rapid irregular heartbeats or palpitations.  No syncope/near syncope or TIA she is on Rosa fugax.  She does have some leg aching with walking, but does not make it consistent with claudication.  She is still dealing with significant tooth pain and supposed to be having some dental surgery planned.  She thinks she could delay if necessary.  ROS: A comprehensive was performed. Review of Systems  Constitutional: Negative for malaise/fatigue.  HENT: Negative for congestion and nosebleeds.        Mouth/dental pain.  Has pending oral surgery --> she needs to have teeth pulled.  Respiratory: Positive for shortness of breath. Negative for wheezing.   Cardiovascular:       Per HPI  Gastrointestinal: Negative for blood in stool, constipation, heartburn and melena.  Genitourinary: Negative for dysuria, hematuria and urgency.  Musculoskeletal: Positive for joint pain (Normal arthritis pain).  Neurological: Negative for dizziness and weakness.  All other systems reviewed and are negative.  I have reviewed and (if needed) personally updated the patient's problem list, medications, allergies, past medical and surgical history, social and family history.   Past Medical History:  Diagnosis Date  . Alcohol abuse 04/25/2015  . Allergy   . Anastomotic ulcer S/P gastric bypass 06/18/2016  . Arthritis   . Cataract   . Degenerative disc disease, cervical   . GAD (generalized anxiety disorder) 06/14/2016  . HTN (hypertension) 04/25/2015  . Hypertension   . Hypothyroidism   . 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 Substance abuse (HCC)    Alcohol abuse 2016  . UGIB (upper gastrointestinal bleed) 04/25/2015    Past Surgical History:  Procedure Laterality Date  . CHOLECYSTECTOMY    . COSMETIC SURGERY    . DIAGNOSTIC LAPAROSCOPY    . ESOPHAGOGASTRODUODENOSCOPY N/A 04/25/2015   Procedure:  ESOPHAGOGASTRODUODENOSCOPY (EGD);  Surgeon: Beverley FiedlerJay M Pyrtle, MD;  [facility];  Service: Endoscopy;  Laterality: N/A;  . FRACTURE SURGERY    . GASTRIC BYPASS    . NM MYOVIEW LTD  10/10/2017   Normal LV size and function EF 72%.  Medium sized mild severity defect in the apical septal apical lateral and apical wall suggestive of breast attenuation.  LOW RISK.  No ischemia or infarction.    . OPEN REDUCTION INTERNAL FIXATION (ORIF) DISTAL RADIAL FRACTURE Right 01/15/2013   Procedure: OPEN REDUCTION INTERNAL FIXATION (ORIF) Right DISTAL RADIUS FRACTURE;  Surgeon: Eldred MangesMark C Yates, MD;  Location: [facility] OR;  Service: Orthopedics;  Laterality: Right;  . TRANSTHORACIC ECHOCARDIOGRAM  10/04/2017   Mild LVH.  EF 60-65%.  Grade 2/moderate diastolic dysfunction.  Mild pulmonary hypertension.  No valve lesions    Bilateral ABIs June 2016: R ABI 1.1, L ABI 1.0; R TBI 0.52, L TBI 0.45 (both abnormal)  Lower extremity venous Dopplers May 2016: No evidence of deep or superficial venous thrombosis or incompetence.  Possible Baker's cyst bilaterally  Current Meds  Medication Sig  . acetaminophen (TYLENOL) 325 MG tablet Take 650 mg by mouth every 6 (six) hours as needed.  . benazepril-hydrochlorthiazide (LOTENSIN HCT) 20-25 MG tablet Take 1 tablet by mouth daily.  . Cholecalciferol (VITAMIN D3) 5000 units TABS 5,000 IU OTC vitamin D3 daily.  . cyanocobalamin (,VITAMIN B-12,) 1000 MCG/ML injection Inject 1,000 mcg into the muscle every 30 (thirty) days. Patient injects at home  . diphenhydrAMINE (BENADRYL) 25 MG tablet Take 25 mg by mouth daily. Patient takes 1-2 nightly  . ferrous sulfate 325 (65 FE) MG tablet Take 325 mg by mouth daily with breakfast.  . furosemide (LASIX) 40 MG tablet Take 0.5 tablets (20 mg total) by mouth daily.  Marland Kitchen. ibuprofen (ADVIL,MOTRIN) 200 MG tablet Take 800 mg by mouth 2 (two) times daily as needed.  Marland Kitchen. levothyroxine (SYNTHROID, LEVOTHROID) 150 MCG tablet Take 1 tablet (150 mcg total)  by mouth daily.  . magnesium 30 MG tablet Take 30 mg by mouth 2 (two) times a week. Patient takes prn and takes 1 gram  . Melatonin 5 MG CAPS Take 10 mg by mouth at bedtime.   . Multiple Vitamin (MULTIVITAMIN) tablet Take 1 tablet by mouth daily.  . pantoprazole (PROTONIX) 40 MG tablet Take 40 mg by mouth daily.  . [DISCONTINUED] carvedilol (COREG) 3.125 MG tablet TAKE 1 TABLET BY MOUTH 2 TIMES DAILY WITH A MEAL.  . [DISCONTINUED] furosemide (LASIX) 40 MG tablet Take 0.5 tablets (20 mg total) by mouth as needed. (Patient taking differently: Take 20 mg by mouth as needed. Patient takes 4 times a week)    Allergies  Allergen Reactions  . Cocoa Anaphylaxis  . Red Dye Hives    Rash and Nausea diarrhea  . Lyrica [Pregabalin] Other (See Comments)    Abnormal bleeding  . Sulfa Antibiotics Rash    Social History   Socioeconomic History  . Marital status: Married    Spouse name: None  . Number of children: None  . Years of education: None  . Highest education level: None  Social Needs  .  Financial resource strain: None  . Food insecurity - worry: None  . Food insecurity - inability: None  . Transportation needs - medical: None  . Transportation needs - non-medical: None  Occupational History  . Occupation: Charity fundraiser    Comment: ICU Sales executive  Tobacco Use  . Smoking status: Never Smoker  . Smokeless tobacco: Never Used  Substance and Sexual Activity  . Alcohol use: Yes    Alcohol/week: 16.8 oz    Types: 28 Glasses of wine per week    Comment: drinks daily and has a bottle of wine per day  . Drug use: No  . Sexual activity: Yes    Birth control/protection: Post-menopausal  Other Topics Concern  . None  Social History Narrative   Marital status: married      Employment: ICU Sales executive - retired      Alcohol: drinks bottle of wine daily in 2016   Takes care of her 40 y/o mom at home    family history includes Breast cancer in her sister; Breast cancer (age of onset: 32) in  her mother; COPD in her father; Cancer in her father and mother; Cancer (age of onset: 71) in her cousin; Diabetes in her father; Heart disease in her mother; Hypertension in her father and mother; Mental illness in her maternal grandmother; Stroke in her maternal grandmother; Stroke (age of onset: 13) in her mother.  Wt Readings from Last 3 Encounters:  10/25/17 254 lb 9.6 oz (115.5 kg)  09/26/17 253 lb 3.2 oz (114.9 kg)  09/25/17 256 lb (116.1 kg)    PHYSICAL EXAM BP 126/72   Pulse 80   Ht 5' 5.5" (1.664 m)   Wt 254 lb 9.6 oz (115.5 kg)   BMI 41.72 kg/m  Physical Exam  Constitutional: She is oriented to person, place, and time. She appears well-developed and well-nourished. No distress.  Healthy-appearing.  Well-groomed  HENT:  Head: Normocephalic and atraumatic.  Mouth/Throat: No oropharyngeal exudate.  Poor dentition  Eyes: Conjunctivae and EOM are normal. Pupils are equal, round, and reactive to light.  Neck: No hepatojugular reflux present. Carotid bruit is not present.  Cardiovascular: Normal rate, regular rhythm, intact distal pulses and normal pulses.  No extrasystoles are present. PMI is not displaced. Exam reveals no gallop.  Murmur heard.  Medium-pitched harsh crescendo-decrescendo early systolic murmur is present with a grade of 1/6 at the upper right sternal border. Pulmonary/Chest: Effort normal and breath sounds normal. No respiratory distress. She has no wheezes. She has no rales.  Abdominal: Soft. Bowel sounds are normal. She exhibits no distension. There is no tenderness. There is no rebound.  Musculoskeletal: Normal range of motion. She exhibits edema (1-2+ bilateral LE). She exhibits no deformity.  Neurological: She is alert and oriented to person, place, and time. No cranial nerve deficit.  Skin: Skin is warm and dry. No rash noted. No erythema.  Mild bilateral lower extremity venous stasis varicose veins/spider veins.   Both feet are "purple" with blanching.    Psychiatric: She has a normal mood and affect. Her behavior is normal. Judgment and thought content normal.  Nursing note and vitals reviewed.    Adult ECG Report Not checked.  Other studies Reviewed: Additional studies/ records that were reviewed today include:  Recent Labs:   Lab Results  Component Value Date   CREATININE 0.87 09/25/2017   BUN 13 09/25/2017   NA 137 09/25/2017   K 4.7 09/25/2017   CL 98 09/25/2017  CO2 26 09/25/2017   Lab Results  Component Value Date   CHOL 183 09/25/2017   HDL 56 09/25/2017   LDLCALC 101 (H) 09/25/2017   TRIG 131 09/25/2017   CHOLHDL 3.3 09/25/2017    ASSESSMENT / PLAN: Problem List Items Addressed This Visit    Aortic systolic murmur on examination    No obvious reason for murmur besides potentially hyperdynamic myocardium and potentially aortic sclerosis.  No obvious valve lesions.      Bilateral lower extremity edema    Cannot tell if this is related to diastolic dysfunction or simply related to venous stasis. Plan: Check lower extremity venous duplex for insufficiency.  Also with a purple discoloration, we will also check ABIs.  It has been 2 years since they were checked.      Relevant Orders   VAS [place] ABI WITH/WO TBI   VAS [place] LOWER EXTREMITY ARTERIAL DUPLEX   VAS [place] LOWER EXTREMITY VENOUS REFLUX   Decreased pedal pulses    Weak pedal pulses on exam.  Physical exam stigmata suggest PID versus PVD. Plan: Lower extremity arterial Dopplers with ABIs.      Relevant Orders   VAS [place] ABI WITH/WO TBI   VAS [place] LOWER EXTREMITY ARTERIAL DUPLEX   VAS [place] LOWER EXTREMITY VENOUS REFLUX   Diastolic dysfunction with chronic heart failure (HCC)    For now with the negative Myoview, the most likely etiology for her dyspnea is due to diastolic dysfunction and deconditioning.  Seems to have improved with adding a diuretic.  For now we will continue daily diuretic dose along with carvedilol which I will increase to 6.25 twice daily.       Relevant Medications   carvedilol (COREG) 6.25 MG tablet   furosemide (LASIX) 40 MG tablet   Exertional dyspnea   HLD (hyperlipidemia) (Chronic)    For now she seems relatively well controlled at goal with nonischemic Myoview.  However low threshold to consider statin.      Relevant Medications   carvedilol (COREG) 6.25 MG tablet   furosemide (LASIX) 40 MG tablet   HTN (hypertension) (Chronic)    Less hypertensive today.  Better controlled but I think we can tolerate increasing carvedilol in addition to existing dose of ACE inhibitor-HCTZ.      Relevant Medications   carvedilol (COREG) 6.25 MG tablet   furosemide (LASIX) 40 MG tablet   Preoperative cardiovascular examination - Primary    Dental surgery is relatively low risk surgery and she has had a negative Myoview stress test.  She should be fine for her surgery.  Suspect that she should be relatively low risk especially with beta-blocker on board. Would avoid excess fluid volume         Current medicines are reviewed at length with the patient today. (+/- concerns) n/a The following changes have been made: n/a  Patient Instructions   MEDICATION INSTRUCTIONS   --- INCREASE CARVEDILOL TO 6.25 MG  TWICE A DAY   ----  FOR NEXT 2 DAYS TAKE  40 MG FUROSEMIDE , THEN TAKE 20 MG  ON A DAILY BASIS     CARDIAC CLEARANCE FOR DENTAL  EXTRACTIONS     TEST TO BE SCHEDULE AT [address] Your physician has requested that you have an ankle brachial index (ABI). During this test an ultrasound and blood pressure cuff are used to evaluate the arteries that supply the arms and legs with blood. Allow thirty minutes for this exam. There are no  restrictions or special instructions.  Your physician has requested that you have a lower  extremity arterial duplex. This test is an ultrasound of the arteries in the legs or arms. It looks at arterial blood flow in the legs. Allow one hour for Lower Arterial scans. There  are no restrictions or special instructions  Your physician has requested that you have a lower extremity venous duplex -- VENOUS INSUFF.. This test is an ultrasound of the veins in the legs. It looks at venous blood flow that carries blood from the heart to the legs. Allow one hour for a Lower Venous exam.  There are no restrictions or special instructions.    Your physician recommends that you schedule a follow-up appointment in FEB 2019 WITH DR Lester Crickenberger.  Studies Ordered:   No orders of the defined types were placed in this encounter.     Bryan Lemma, M.D., M.S. Interventional Cardiologist   Pager # [phone] Phone # [phone] [address] [place], [place] [address]

## 2017-10-25 NOTE — Patient Instructions (Signed)
MEDICATION INSTRUCTIONS   --- INCREASE CARVEDILOL TO 6.25 MG  TWICE A DAY   ----  FOR NEXT 2 DAYS TAKE  40 MG FUROSEMIDE , THEN TAKE 20 MG  ON A DAILY BASIS     CARDIAC CLEARANCE FOR DENTAL  EXTRACTIONS     TEST TO BE SCHEDULE AT [address] Your physician has requested that you have an ankle brachial index (ABI). During this test an ultrasound and blood pressure cuff are used to evaluate the arteries that supply the arms and legs with blood. Allow thirty minutes for this exam. There are no restrictions or special instructions.  Your physician has requested that you have a lower  extremity arterial duplex. This test is an ultrasound of the arteries in the legs or arms. It looks at arterial blood flow in the legs. Allow one hour for Lower Arterial scans. There are no restrictions or special instructions  Your physician has requested that you have a lower extremity venous duplex -- VENOUS INSUFF.. This test is an ultrasound of the veins in the legs. It looks at venous blood flow that carries blood from the heart to the legs. Allow one hour for a Lower Venous exam.  There are no restrictions or special instructions.    Your physician recommends that you schedule a follow-up appointment in FEB 2019 WITH DR HARDING.

## 2017-10-26 ENCOUNTER — Other Ambulatory Visit: Payer: Self-pay | Admitting: Cardiology

## 2017-10-26 DIAGNOSIS — R609 Edema, unspecified: Secondary | ICD-10-CM

## 2017-10-27 ENCOUNTER — Encounter: Payer: Self-pay | Admitting: Cardiology

## 2017-10-27 DIAGNOSIS — R0989 Other specified symptoms and signs involving the circulatory and respiratory systems: Secondary | ICD-10-CM | POA: Insufficient documentation

## 2017-10-27 NOTE — Assessment & Plan Note (Signed)
No obvious reason for murmur besides potentially hyperdynamic myocardium and potentially aortic sclerosis.  No obvious valve lesions.

## 2017-10-27 NOTE — Assessment & Plan Note (Signed)
For now she seems relatively well controlled at goal with nonischemic Myoview.  However low threshold to consider statin.

## 2017-10-27 NOTE — Assessment & Plan Note (Signed)
Weak pedal pulses on exam.  Physical exam stigmata suggest PID versus PVD. Plan: Lower extremity arterial Dopplers with ABIs.

## 2017-10-27 NOTE — Assessment & Plan Note (Addendum)
Cannot tell if this is related to diastolic dysfunction or simply related to venous stasis. Plan: Check lower extremity venous duplex for insufficiency.  Also with a purple discoloration, we will also check ABIs.  It has been 2 years since they were checked.

## 2017-10-27 NOTE — Assessment & Plan Note (Signed)
For now with the negative Myoview, the most likely etiology for her dyspnea is due to diastolic dysfunction and deconditioning.  Seems to have improved with adding a diuretic.  For now we will continue daily diuretic dose along with carvedilol which I will increase to 6.25 twice daily.

## 2017-10-27 NOTE — Assessment & Plan Note (Signed)
Dental surgery is relatively low risk surgery and she has had a negative Myoview stress test.  She should be fine for her surgery.  Suspect that she should be relatively low risk especially with beta-blocker on board. Would avoid excess fluid volume

## 2017-10-27 NOTE — Assessment & Plan Note (Signed)
Less hypertensive today.  Better controlled but I think we can tolerate increasing carvedilol in addition to existing dose of ACE inhibitor-HCTZ.

## 2017-11-08 ENCOUNTER — Other Ambulatory Visit: Payer: Self-pay | Admitting: Cardiology

## 2017-11-08 DIAGNOSIS — R0989 Other specified symptoms and signs involving the circulatory and respiratory systems: Secondary | ICD-10-CM

## 2017-11-08 DIAGNOSIS — R600 Localized edema: Secondary | ICD-10-CM

## 2017-11-09 ENCOUNTER — Ambulatory Visit (HOSPITAL_COMMUNITY)
Admission: RE | Admit: 2017-11-09 | Discharge: 2017-11-09 | Disposition: A | Discharge status: Home / Self Care | Payer: Medicare Other | Source: Ambulatory Visit | Attending: Cardiovascular Disease | Admitting: Cardiovascular Disease

## 2017-11-09 DIAGNOSIS — R609 Edema, unspecified: Secondary | ICD-10-CM | POA: Diagnosis not present

## 2017-11-15 ENCOUNTER — Ambulatory Visit (HOSPITAL_COMMUNITY)
Admission: RE | Admit: 2017-11-15 | Discharge: 2017-11-15 | Disposition: A | Discharge status: Home / Self Care | Payer: Medicare Other | Source: Ambulatory Visit | Attending: Cardiology | Admitting: Cardiology

## 2017-11-15 ENCOUNTER — Telehealth: Payer: Self-pay | Admitting: *Deleted

## 2017-11-15 DIAGNOSIS — R600 Localized edema: Secondary | ICD-10-CM | POA: Diagnosis not present

## 2017-11-15 DIAGNOSIS — R0989 Other specified symptoms and signs involving the circulatory and respiratory systems: Secondary | ICD-10-CM | POA: Diagnosis not present

## 2017-11-15 NOTE — Telephone Encounter (Signed)
Spoke to patient. Result given . Verbalized understanding  

## 2017-11-15 NOTE — Telephone Encounter (Signed)
-----   Message from Marykay Lexavid W Harding, MD sent at 11/10/2017 10:57 PM EST ----- Lower extremity vein no evidence of deep vein thrombosis/clot or deep vein insufficiency noted.  -This did not evaluate superficial veins however.  Bryan Lemmaavid Harding, MD

## 2017-11-15 NOTE — Telephone Encounter (Signed)
LEFT MESSAGE  TO CALL BACK FOR LAB  RESULTS

## 2017-11-19 ENCOUNTER — Encounter: Payer: Self-pay | Admitting: Family Medicine

## 2017-11-19 ENCOUNTER — Other Ambulatory Visit: Payer: Self-pay

## 2017-11-19 ENCOUNTER — Ambulatory Visit (INDEPENDENT_AMBULATORY_CARE_PROVIDER_SITE_OTHER): Payer: Medicare Other | Admitting: Family Medicine

## 2017-11-19 VITALS — BP 146/82 | HR 75 | Temp 98.5°F | Resp 16 | Ht 65.0 in | Wt 260.0 lb

## 2017-11-19 DIAGNOSIS — J0100 Acute maxillary sinusitis, unspecified: Secondary | ICD-10-CM

## 2017-11-19 DIAGNOSIS — R220 Localized swelling, mass and lump, head: Secondary | ICD-10-CM | POA: Diagnosis not present

## 2017-11-19 DIAGNOSIS — K089 Disorder of teeth and supporting structures, unspecified: Secondary | ICD-10-CM

## 2017-11-19 MED ORDER — AMOXICILLIN-POT CLAVULANATE 875-125 MG PO TABS: 1.0000 | ORAL_TABLET | Freq: Two times a day (BID) | ORAL | 0 refills | Status: DC

## 2017-11-19 NOTE — Patient Instructions (Addendum)
Drink plenty of fluids to stay well-hydrated.  The better hydrated you are the thinner your secretions and the last things block up.  Take Augmentin (amoxicillin/clavulanate) 875 mg 1 twice daily for the infection  Use fluticasone nose spray 2 sprays each nostril twice daily for 4 days, then once daily to try and keep the sinuses opened up.  Take Tylenol or ibuprofen as needed for pain and/or fever.  Return if worse or see your primary care or dentist if problems get worse.      IF you received an x-ray today, you will receive an invoice from [facility]. Please contact [facility] at [phone] with questions or concerns regarding your invoice.   IF you received labwork today, you will receive an invoice from [facility]. Please contact [facility] at [phone] with questions or concerns regarding your invoice.   Our billing staff will not be able to assist you with questions regarding bills from these companies.  You will be contacted with the lab results as soon as they are available. The fastest way to get your results is to activate your My Chart account. Instructions are located on the last page of this paperwork. If you have not heard from us regarding the results in 2 weeks, please contact this office.
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
## 2017-11-21 ENCOUNTER — Ambulatory Visit: Payer: Medicare Other | Admitting: Family Medicine

## 2017-11-22 ENCOUNTER — Telehealth: Payer: Self-pay | Admitting: *Deleted

## 2017-11-22 NOTE — Telephone Encounter (Signed)
-----   Message from Marykay Lexavid W Harding, MD sent at 11/22/2017  2:57 PM EST ----- Leg Doppler results: Good news - no evidence of significant leg artery blockages Right ABIs appear essentially unchanged compared to prior study on 05/06/2015.  Left ABIs appear essentially unchanged compared to prior study on 05/06/2015.  Final Interpretation: Right: Resting right ankle-brachial index is within normal range. No evidence of significant right lower extremity arterial disease. The right toe-brachial index is normal.  Left: Resting left ankle-brachial index is within normal range. No evidence of significant left lower extremity arterial disease. The left toe-brachial index is normal.

## 2017-11-22 NOTE — Telephone Encounter (Signed)
MESSAGE LEFT ON ANSWER MACHINE TO CALL BACK  PATIENT HAS NOT REVIEWED MYCHART

## 2017-11-26 NOTE — Telephone Encounter (Signed)
Message left 11/23/17 with results, per DPR , by another  Clinical staff .

## 2017-11-28 ENCOUNTER — Telehealth: Payer: Self-pay | Admitting: Cardiology

## 2017-11-28 NOTE — Telephone Encounter (Signed)
Patient has been made aware of results and verbalized her understanding:  Notes recorded by Marykay LexHarding, David W, MD on 11/10/2017 at 10:57 PM EST Lower extremity vein no evidence of deep vein thrombosis/clot or deep vein insufficiency noted.  -This did not evaluate superficial veins however.   ABI:  Final Interpretation: Right: Resting right ankle-brachial index is within normal range. No evidence of significant right lower extremity arterial disease. The right toe-brachial index is normal.  Left: Resting left ankle-brachial index is within normal range. No evidence of significant left lower extremity arterial disease. The left toe-brachial index is normal.

## 2017-11-28 NOTE — Telephone Encounter (Signed)
F/u Message  Pt returning call about results. Please call back to discuss

## 2017-12-03 ENCOUNTER — Ambulatory Visit (INDEPENDENT_AMBULATORY_CARE_PROVIDER_SITE_OTHER): Payer: Medicare Other | Admitting: Family Medicine

## 2017-12-03 ENCOUNTER — Encounter: Payer: Self-pay | Admitting: Family Medicine

## 2017-12-03 VITALS — BP 127/81 | HR 66 | Ht 65.0 in | Wt 257.5 lb

## 2017-12-03 DIAGNOSIS — F1010 Alcohol abuse, uncomplicated: Secondary | ICD-10-CM | POA: Diagnosis not present

## 2017-12-03 DIAGNOSIS — E039 Hypothyroidism, unspecified: Secondary | ICD-10-CM | POA: Diagnosis not present

## 2017-12-03 DIAGNOSIS — R600 Localized edema: Secondary | ICD-10-CM

## 2017-12-03 DIAGNOSIS — E782 Mixed hyperlipidemia: Secondary | ICD-10-CM

## 2017-12-03 DIAGNOSIS — F411 Generalized anxiety disorder: Secondary | ICD-10-CM | POA: Diagnosis not present

## 2017-12-03 DIAGNOSIS — G629 Polyneuropathy, unspecified: Secondary | ICD-10-CM

## 2017-12-03 DIAGNOSIS — R609 Edema, unspecified: Secondary | ICD-10-CM

## 2017-12-03 DIAGNOSIS — Z9884 Bariatric surgery status: Secondary | ICD-10-CM

## 2017-12-03 DIAGNOSIS — I10 Essential (primary) hypertension: Secondary | ICD-10-CM | POA: Diagnosis not present

## 2017-12-03 DIAGNOSIS — M792 Neuralgia and neuritis, unspecified: Secondary | ICD-10-CM | POA: Diagnosis not present

## 2017-12-03 MED ORDER — CYANOCOBALAMIN 1000 MCG/ML IJ SOLN: 1000.0000 ug | INTRAMUSCULAR | 1 refills | Status: DC

## 2017-12-03 MED ORDER — GABAPENTIN 300 MG PO CAPS: ORAL_CAPSULE | ORAL | 0 refills | Status: DC

## 2017-12-03 NOTE — Patient Instructions (Addendum)
Consider Muscle floss bands instd of TED compression stockings which are too diff to get on and off.    For the Neurontin, this is a medicine that can potentially cause side effects and we will need to go up on the dose slowly over time.  - Start off with taking one Neurontin cap every evening for 3 days, if well tolerated then-  - take 1 cap twice daily for 3 days, if still well tolerated then-   - take 1 cap 3 times daily for 3 days, if tolerated then-   - take 1 in the morning, 1 in the afternoon and 2 caps at night for 3 days, if well tolerated-  (such as:  0,0,1 * 3 d 0,1,1 * 3 days,  1,1,1, * 3 days 1,1,2 * 3 days 1,2,2 * 3 days)   - take 1 in the morning,  2 in the afternoon, and 2 at night for 3 days and then slowly work your way up to 2 caps 3 times a day, then   - take 2, 2, 3 caps * 3 days etc.    We will go up on the dose and give you a new prescription once you are running low ( down to about 1 week of pills).    Go up to 3 capsules 3 times a day as tolerated before next OV       Guidelines for a Low Cholesterol, Low Saturated Fat Diet   Fats - Limit total intake of fats and oils. - Avoid butter, stick margarine, shortening, lard, palm and coconut oils. - Limit mayonnaise, salad dressings, gravies and sauces, unless they are homemade with low-fat ingredients. - Limit chocolate. - Choose low-fat and nonfat products, such as low-fat mayonnaise, low-fat or non-hydrogenated peanut butter, low-fat or fat-free salad dressings and nonfat gravy. - Use vegetable oil, such as canola or olive oil. - Look for margarine that does not contain trans fatty acids. - Use nuts in moderate amounts. - Read ingredient labels carefully to determine both amount and type of fat present in foods. Limit saturated and trans fats! - Avoid high-fat processed and convenience foods.  Meats and Meat Alternatives - Choose fish, chicken, [place] and lean meats. - Use dried beans, peas,  lentils and tofu. - Limit egg yolks to three to four per week. - If you eat red meat, limit to no more than three servings per week and choose loin or round cuts. - Avoid fatty meats, such as bacon, sausage, franks, luncheon meats and ribs. - Avoid all organ meats, including liver.  Dairy - Choose nonfat or low-fat milk, yogurt and cottage cheese. - Most cheeses are high in fat. Choose cheeses made from non-fat milk, such as mozzarella and ricotta cheese. - Choose light or fat-free cream cheese and sour cream. - Avoid cream and sauces made with cream.  Fruits and Vegetables - Eat a wide variety of fruits and vegetables. - Use lemon juice, vinegar or "mist" olive oil on vegetables. - Avoid adding sauces, fat or oil to vegetables.  Breads, Cereals and Grains - Choose whole-grain breads, cereals, pastas and rice. - Avoid high-fat snack foods, such as granola, cookies, pies, pastries, doughnuts and croissants.  Cooking Tips - Avoid deep fried foods. - Trim visible fat off meats and remove skin from poultry before cooking. - Bake, broil, boil, poach or roast poultry, fish and lean meats. - Drain and discard fat that drains out of meat as you cook it. - Add  little or no fat to foods. - Use vegetable oil sprays to grease pans for cooking or baking. - Steam vegetables. - Use herbs or no-oil marinades to flavor foods.

## 2017-12-03 NOTE — Progress Notes (Signed)
Impression and Recommendations:    1. Peripheral neuropathy due to edema b/l feet   2. Essential hypertension   3. Mixed hyperlipidemia   4. Morbid obesity (HCC)   5. H/O gastric bypass   6. Alcohol abuse   7. GAD (generalized anxiety disorder)   8. Peripheral edema- b/l L Ext w chronic skin changes   9. Hypothyroidism, unspecified type     HTN  - As an alternative to TED compression stockings, muscle floss bands were recommended to the patient, as she does not enjoy wearing her compression hose.  HLD - LDL was still slightly elevated last check in October. Patient would like to lose five pounds and see if she can treat her cholesterol with diet changes. Otherwise, we may consider adding a statin cholesterol medication in the future, as per recommendation.  Exercise and Weight Management - Advised the patient to begin exercise regimen, as well as consume half of their body weight in ounces of water.  - Recommended that the patient try to walk 5 minutes, twice a day, with the end goal of 150 minutes of cardio per week according to the AHA.   Mood - Development and pursuit of a hobby recommended.  EtOH Abuse - Down 1.5 bottles wine, white or red, per day down from 2 bottles daily prior. - Further reduction of alcohol consumption recommended.  Dyspnea on Exertion & Lower Extremity Swelling - Patient was last seen in October for dyspnea on exertion and lower exremity swelling. Sent to cardiology for further evaluation. She had lexi scan, stress test, echocardiogram, vascular studies to both of her lower extremities, arterial and venous, and everything was normal. No evidence of DVT in lower extremity.  - Walking recommended to help alleviate LE swelling.  - Continues on Lotensin, and added Carvedilol as prescribed by Dr. Herbie BaltimoreHarding. - She will return to see Cardiologist Dr. Herbie BaltimoreHarding in February.  Peripheral Neuropathy due to Edema (bilateral feet) - Walking recommended to  help alleviate the pins & needles feeling. Decrease swelling by using flossing bands. 300 caps Neurontin prescribed to help assist.  Vitamin B12 Patient prefers individual B12 injections as opposed to multi-dose, which she injects at home. She gives herself the 1000 1 cc every 30 days. - Refill for 6 months  - She will return in a week for follow-up, to evaluate progress     Meds ordered this encounter  Medications  . cyanocobalamin (,VITAMIN B-12,) 1000 MCG/ML injection    Sig: Inject 1 mL (1,000 mcg total) into the muscle every 30 (thirty) days. Please dispense 1 mL vials    Dispense:  6 mL    Refill:  1  . gabapentin (NEURONTIN) 300 MG capsule    Sig: One tab PO qHS for 3d, then BID for 3d, then TID.    Dispense:  270 capsule    Refill:  0    Gross side effects, risk and benefits, and alternatives of medications and treatment plan in general discussed with patient.  Patient is aware that all medications have potential side effects and we are unable to predict every side effect or drug-drug interaction that may occur.   Patient will call with any questions prior to using medication if they have concerns.  Expresses verbal understanding and consents to current therapy and treatment regimen.  No barriers to understanding were identified.  Red flag symptoms and signs discussed in detail.  Patient expressed understanding regarding what to do in case of emergency\urgent symptoms  Please see AVS handed out to patient at the end of our visit for further patient instructions/ counseling done pertaining to today's office visit.   Return in about 4 weeks (around 12/31/2017) for Follow-up new med Neurontin.    Note: This note was prepared with assistance of Dragon voice recognition software. Occasional wrong-word or sound-a-like substitutions may have occurred due to the inherent limitations of voice recognition software.   This document serves as a record of services personally performed by  Thomasene Lot, DO. It was created on her behalf by Peggye Fothergill, a trained medical scribe. The creation of this record is based on the scribe's personal observations and the provider's statements to them.   I have reviewed the above medical documentation for accuracy and completeness and I concur.  Thomasene Lot 12/03/17 11:58 AM   --------------------------------------------------------------------------------------------------------------------------------------------------------------------------------------------------------------------------------------------    Subjective:     HPI: [name] is a 72 y.o. female who presents to [facility] today for issues as discussed below.  She has been in a fabulous mood lately thanks to the ability to spend more time with her husband.  Dyspnea on Exertion & Lower Extremity Swelling She was sent for evaluation in October, and everything came back ok. She is still taking Lotensin, and added Carvedilol as prescribed by Dr. Herbie Baltimore. She feels she is no longer experiencing as much shortness of breath, and can, for example, go up stairs more easily.   She was recommended compression hose but has not been wearing them.  HTN She has not been checking her blood pressure at home. She has no complaints about her heart rate decreasing, and denies lightheadedness.  HLD Patient would like to lose five pounds and see if she can treat her cholesterol with diet changes.  Exercise and Weight Management For exercise, she remarks that she has a very large house. She has been walking down the lane to her mailbox and gone on some vacations recently where she has done "a lot of walking."  Mood Overall, she reports that her mood has improved, especially over the holidays. She has fun cooking, and plans on beginning quilting/sewing as a hobby.  EtOH Abuse On average, she says she is down to 1.5 bottles wine, white  or red, per day down from 2 bottles daily prior.  Peripheral Neuropathy due to Edema (bilateral feet) She notes that she as a constant pins and needles feeling in her feet bilaterally, onset about a year and a half ago. She notes no functional problems, and that when she is seated, she makes sure her legs are elevated.  Patient notes that she needs to reschedule her dental exam. She will be having most of her teeth pulled and getting plates, partials, or dentures. Cardiologist has given her the clear to have procedures done.  She continues to see Dr. Karie Schwalbe for her ortho problems.   Wt Readings from Last 3 Encounters:  12/03/17 257 lb 8 oz (116.8 kg)  11/19/17 260 lb (117.9 kg)  10/25/17 254 lb 9.6 oz (115.5 kg)   BP Readings from Last 3 Encounters:  12/03/17 127/81  11/19/17 (!) 146/82  10/25/17 126/72   Pulse Readings from Last 3 Encounters:  12/03/17 66  11/19/17 75  10/25/17 80   BMI Readings from Last 3 Encounters:  12/03/17 42.85 kg/m  11/19/17 43.27 kg/m  10/25/17 41.72 kg/m     Patient Care Team    Relationship Specialty Notifications Start End  Thomasene Lot, DO PCP -  General Family Medicine  06/14/16   Monica Becton, MD Consulting Physician Sports Medicine  11/08/16    Comment: OA- knee injections; PT  Jerolyn Shin, MD Consulting Physician Neurology  09/25/17    Comment: neuropsych- seen him for pinched nerves in neck in past  Marykay Lex, MD Consulting Physician Cardiology  12/03/17      Patient Active Problem List   Diagnosis Date Noted  . Hypertriglyceridemia 09/29/2016    Priority: High  . HLD (hyperlipidemia) 09/29/2016    Priority: High  . Adjustment disorder with mixed anxiety and depressed mood 09/29/2016    Priority: High  . Morbid obesity (HCC) 06/18/2016    Priority: High  . R Congenital Kidney ABN: Ess only has one fxn kidney 06/18/2016    Priority: High  . HTN (hypertension) 04/25/2015    Priority: High  . Alcohol abuse  04/25/2015    Priority: High  . H/O gastric bypass 06/18/2016    Priority: Medium  . Peripheral edema- b/l L Ext w chronic skin changes 06/18/2016    Priority: Medium  . GAD (generalized anxiety disorder) 06/14/2016    Priority: Medium  . UGIB (upper gastrointestinal bleed) 04/25/2015    Priority: Medium  . Acute ulcer at gastrojejunal region- post gastric bypass     Priority: Medium  . Vitamin D deficiency 09/29/2016    Priority: Low  . h/o Iron deficiency anemia 06/14/2016    Priority: Low  . Hypothyroidism 02/13/2016    Priority: Low  . Peripheral neuropathy due to edema b/l feet 12/03/2017  . Decreased pedal pulses 10/27/2017  . Diastolic dysfunction with chronic heart failure (HCC) 10/25/2017  . Aortic systolic murmur on examination 10/02/2017  . Preoperative cardiovascular examination 10/02/2017  . Exertional dyspnea 09/25/2017  . Bilateral lower extremity edema 11/07/2016  . Primary osteoarthritis of both knees 11/02/2016  . History of hysterectomy 06/18/2016  . Insomnia due to alcohol excess and stress 06/18/2016  . Myalgia and myositis 06/14/2016  . Stress at home 02/13/2016  . Distal radius fracture, right 01/15/2013    Past Medical history, Surgical history, Family history, Social history, Allergies and Medications have been entered into the medical record, reviewed and changed as needed.    Current Meds  Medication Sig  . acetaminophen (TYLENOL) 325 MG tablet Take 650 mg by mouth every 6 (six) hours as needed.  . benazepril-hydrochlorthiazide (LOTENSIN HCT) 20-25 MG tablet Take 1 tablet by mouth daily.  . carvedilol (COREG) 6.25 MG tablet Take 1 tablet (6.25 mg total) by mouth 2 (two) times daily with a meal.  . Cholecalciferol (VITAMIN D3) 5000 units TABS 5,000 IU OTC vitamin D3 daily.  . diphenhydrAMINE (BENADRYL) 25 MG tablet Take 25 mg by mouth daily. Patient takes 1-2 nightly  . ferrous sulfate 325 (65 FE) MG tablet Take 325 mg by mouth daily with  breakfast.  . furosemide (LASIX) 40 MG tablet Take 0.5 tablets (20 mg total) by mouth daily.  Marland Kitchen ibuprofen (ADVIL,MOTRIN) 200 MG tablet Take 800 mg by mouth 2 (two) times daily as needed.  Marland Kitchen levothyroxine (SYNTHROID, LEVOTHROID) 150 MCG tablet Take 1 tablet (150 mcg total) by mouth daily.  . magnesium 30 MG tablet Take 30 mg by mouth 2 (two) times a week. Patient takes prn and takes 1 gram  . Melatonin 5 MG CAPS Take 10 mg by mouth at bedtime.   . Multiple Vitamin (MULTIVITAMIN) tablet Take 1 tablet by mouth daily.  . pantoprazole (PROTONIX) 40 MG tablet Take 40 mg  by mouth daily.  . [DISCONTINUED] amoxicillin-clavulanate (AUGMENTIN) 875-125 MG tablet Take 1 tablet by mouth 2 (two) times daily.    Allergies:  Allergies  Allergen Reactions  . Cocoa Anaphylaxis  . Red Dye Hives    Rash and Nausea diarrhea  . Lyrica [Pregabalin] Other (See Comments)    Abnormal bleeding  . Sulfa Antibiotics Rash    Review of Systems:  A fourteen system review of systems was performed and found to be positive as per HPI.   Objective:   Blood pressure 127/81, pulse 66, height 5\' 5"  (1.651 m), weight 257 lb 8 oz (116.8 kg), SpO2 96 %. Body mass index is 42.85 kg/m. General:  Well Developed, well nourished, appropriate for stated age.  Neuro:  Alert and oriented,  extra-ocular muscles intact  HEENT:  Normocephalic, atraumatic, neck supple, no carotid bruits appreciated  Skin:  no gross rash, warm, pink. Cardiac:  1/6 upper left sternal border ejection murmur.  RRR, S1 S2 Respiratory:  ECTA B/L and A/P, Not using accessory muscles, speaking in full sentences- unlabored. Vascular:  Ext warm, no cyanosis apprec.; cap RF less 2 sec. + pitting edema b/l lower ext Psych:  No HI/SI, judgement and insight good, Euthymic mood. Full Affect.

## 2017-12-18 ENCOUNTER — Ambulatory Visit: Payer: Medicare Other

## 2017-12-18 ENCOUNTER — Encounter: Payer: Self-pay | Admitting: Adult Health

## 2017-12-18 ENCOUNTER — Ambulatory Visit (INDEPENDENT_AMBULATORY_CARE_PROVIDER_SITE_OTHER): Payer: Medicare Other | Admitting: Adult Health

## 2017-12-18 VITALS — BP 108/65 | HR 76 | Ht 65.0 in | Wt 265.0 lb

## 2017-12-18 DIAGNOSIS — M25561 Pain in right knee: Secondary | ICD-10-CM | POA: Insufficient documentation

## 2017-12-18 DIAGNOSIS — I10 Essential (primary) hypertension: Secondary | ICD-10-CM

## 2017-12-18 DIAGNOSIS — L03115 Cellulitis of right lower limb: Secondary | ICD-10-CM | POA: Insufficient documentation

## 2017-12-18 IMAGING — DX DG KNEE COMPLETE 4+V*R*
4 series · 4 of 4 positions shown · non-contrast
Comparison: None.

CLINICAL DATA: Fall.  Acute right knee pain.  Initial encounter.

EXAM:
RIGHT KNEE - COMPLETE 4+ VIEW

[knee standing ap (1 of 3)]
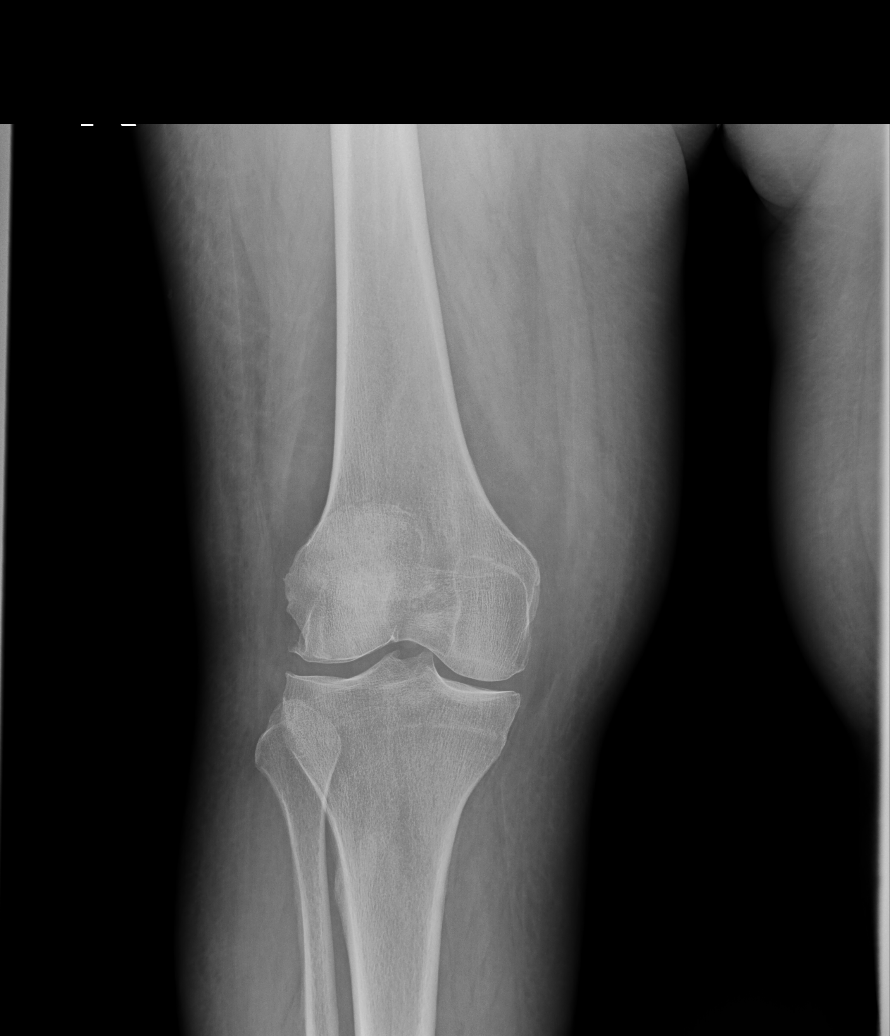

[knee standing lat]
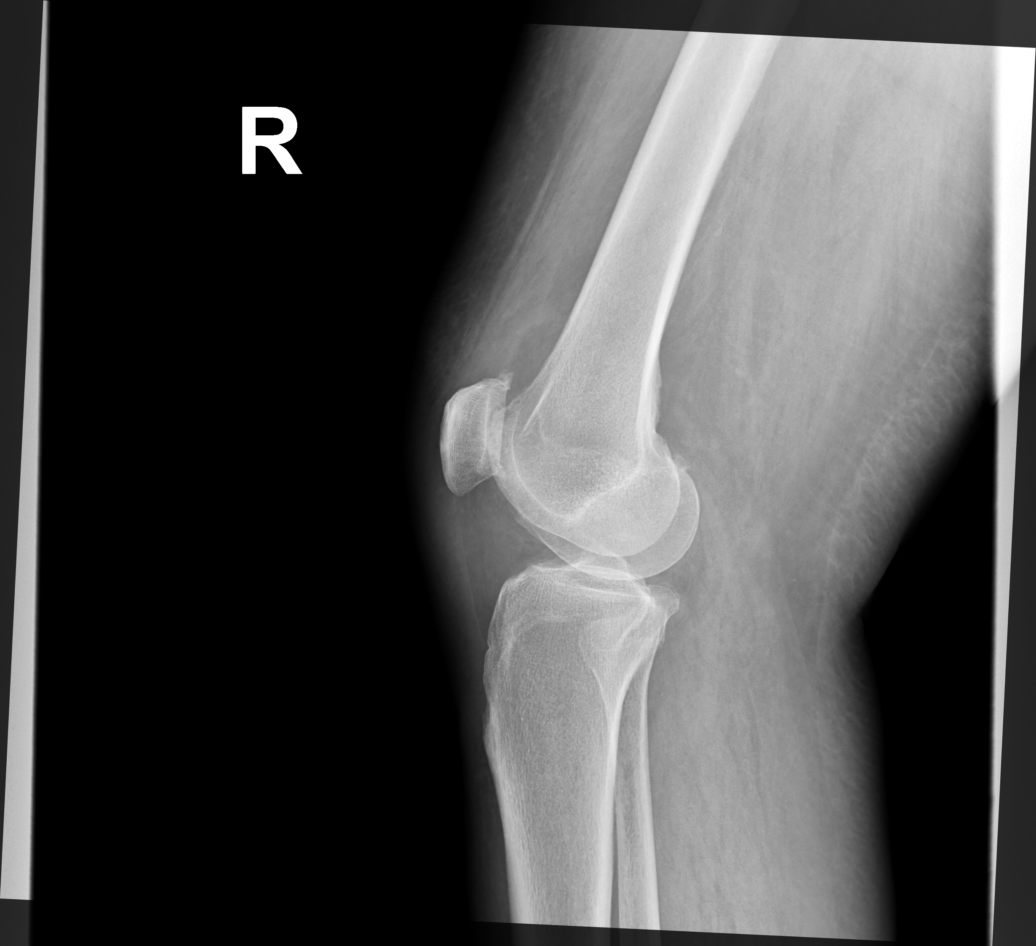

[knee standing ap (2 of 3)]
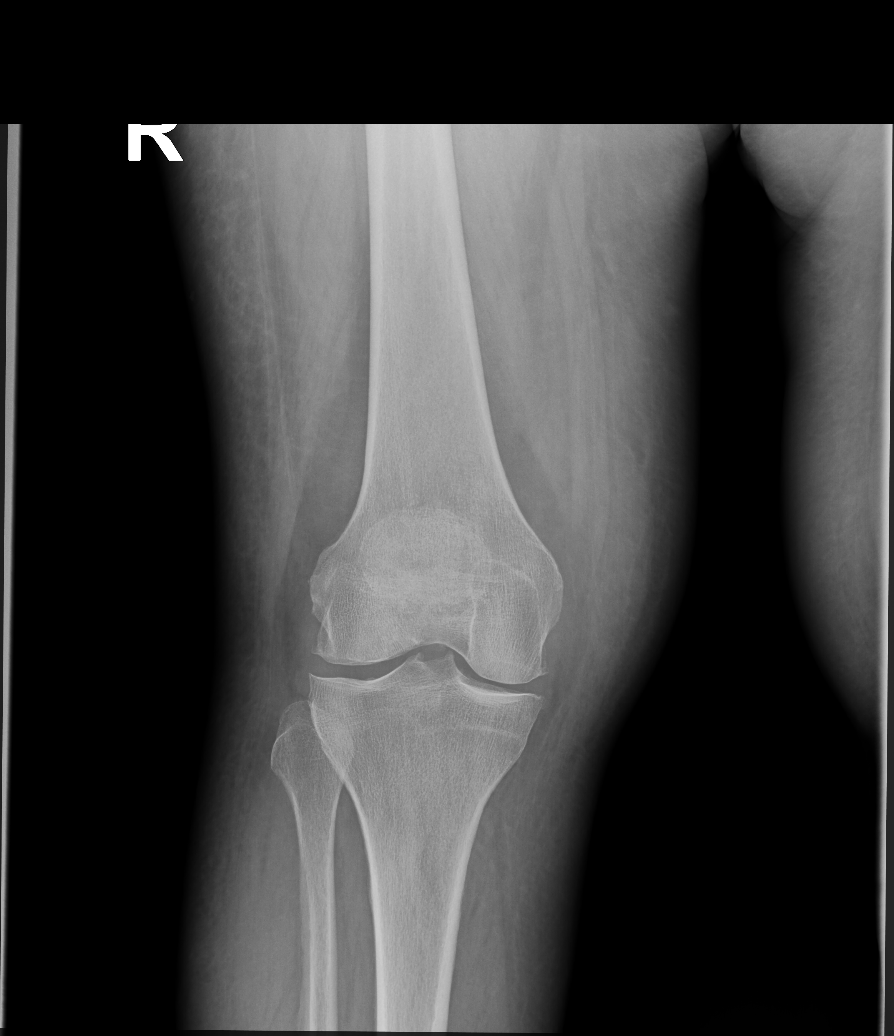

[knee standing ap (3 of 3)]
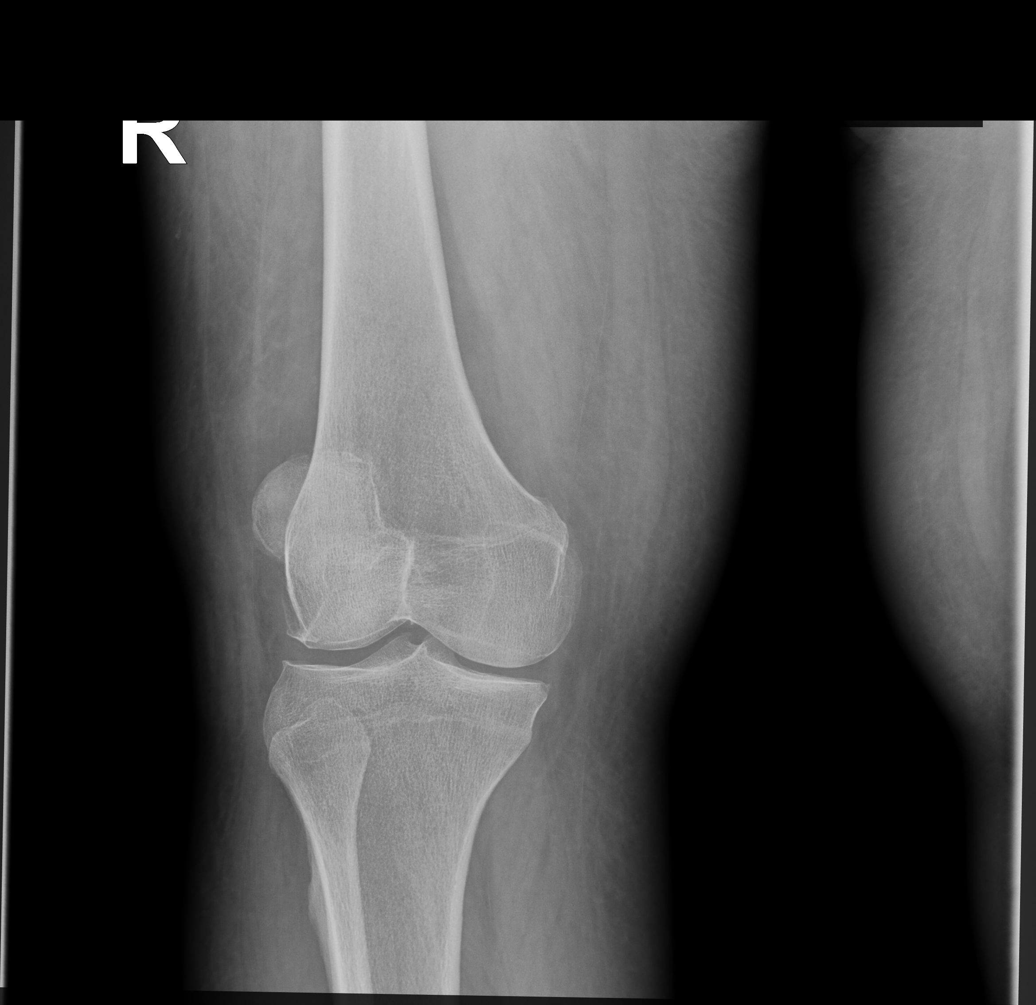

[4 of 4 positions shown; findings below may reference images not displayed]

FINDINGS: No evidence of fracture, dislocation, or joint effusion. Mild
tricompartmental degenerative spurring is seen without joint space
narrowing. Soft tissues are unremarkable.
IMPRESSION: No acute findings.

Mild tricompartmental osteoarthritis.

## 2017-12-18 MED ORDER — CYCLOBENZAPRINE HCL 10 MG PO TABS: 10.0000 mg | ORAL_TABLET | Freq: Three times a day (TID) | ORAL | 0 refills | Status: DC | PRN

## 2017-12-18 MED ORDER — DOXYCYCLINE HYCLATE 100 MG PO TABS
100.0000 mg | ORAL_TABLET | Freq: Two times a day (BID) | ORAL | 0 refills | Status: DC
Start: 2017-12-18 — End: 2018-03-04

## 2017-12-18 MED ORDER — MELOXICAM 7.5 MG PO TABS: 7.5000 mg | ORAL_TABLET | Freq: Two times a day (BID) | ORAL | 0 refills | Status: DC | PRN

## 2017-12-18 NOTE — Patient Instructions (Addendum)
Contusion A contusion is a deep bruise. Contusions are the result of a blunt injury to tissues and muscle fibers under the skin. The injury causes bleeding under the skin. The skin overlying the contusion may turn blue, purple, or yellow. Minor injuries will give you a painless contusion, but more severe contusions may stay painful and swollen for a few weeks. What are the causes? This condition is usually caused by a blow, trauma, or direct force to an area of the body. What are the signs or symptoms? Symptoms of this condition include:  Swelling of the injured area.  Pain and tenderness in the injured area.  Discoloration. The area may have redness and then turn blue, purple, or yellow.  How is this diagnosed? This condition is diagnosed based on a physical exam and medical history. An X-ray, CT scan, or MRI may be needed to determine if there are any associated injuries, such as broken bones (fractures). How is this treated? Specific treatment for this condition depends on what area of the body was injured. In general, the best treatment for a contusion is resting, icing, applying pressure to (compression), and elevating the injured area. This is often called the RICE strategy. Over-the-counter anti-inflammatory medicines may also be recommended for pain control. Follow these instructions at home:  Rest the injured area.  If directed, apply ice to the injured area: ? Put ice in a plastic bag. ? Place a towel between your skin and the bag. ? Leave the ice on for 20 minutes, 2-3 times per day.  If directed, apply light compression to the injured area using an elastic bandage. Make sure the bandage is not wrapped too tightly. Remove and reapply the bandage as directed by your health care provider.  If possible, raise (elevate) the injured area above the level of your heart while you are sitting or lying down.  Take over-the-counter and prescription medicines only as told by your health  care provider. Contact a health care provider if:  Your symptoms do not improve after several days of treatment.  Your symptoms get worse.  You have difficulty moving the injured area. Get help right away if:  You have severe pain.  You have numbness in a hand or foot.  Your hand or foot turns pale or cold. This information is not intended to replace advice given to you by your health care provider. Make sure you discuss any questions you have with your health care provider. Document Released: 08/23/2005 Document Revised: 03/23/2016 Document Reviewed: 03/31/2015 Elsevier Interactive Patient Education  2018 [employer]   Cellulitis, Adult Cellulitis is a skin infection. The infected area is usually red and tender. This condition occurs most often in the arms and lower legs. The infection can travel to the muscles, blood, and underlying tissue and become serious. It is very important to get treated for this condition. What are the causes? Cellulitis is caused by bacteria. The bacteria enter through a break in the skin, such as a cut, burn, insect bite, open sore, or crack. What increases the risk? This condition is more likely to occur in people who:  Have a weak defense system (immune system).  Have open wounds on the skin such as cuts, burns, bites, and scrapes. Bacteria can enter the body through these open wounds.  Are older.  Have diabetes.  Have a type of long-lasting (chronic) liver disease (cirrhosis) or kidney disease.  Use IV drugs.  What are the signs or symptoms? Symptoms of this condition  include:  Redness, streaking, or spotting on the skin.  Swollen area of the skin.  Tenderness or pain when an area of the skin is touched.  Warm skin.  Fever.  Chills.  Blisters.  How is this diagnosed? This condition is diagnosed based on a medical history and physical exam. You may also have tests, including:  Blood tests.  Lab tests.  Imaging  tests.  How is this treated? Treatment for this condition may include:  Medicines, such as antibiotic medicines or antihistamines.  Supportive care, such as rest and application of cold or warm cloths (cold or warm compresses) to the skin.  Hospital care, if the condition is severe.  The infection usually gets better within 1-2 days of treatment. Follow these instructions at home:  Take over-the-counter and prescription medicines only as told by your health care provider.  If you were prescribed an antibiotic medicine, take it as told by your health care provider. Do not stop taking the antibiotic even if you start to feel better.  Drink enough fluid to keep your urine clear or pale yellow.  Do not touch or rub the infected area.  Raise (elevate) the infected area above the level of your heart while you are sitting or lying down.  Apply warm or cold compresses to the affected area as told by your health care provider.  Keep all follow-up visits as told by your health care provider. This is important. These visits let your health care provider make sure a more serious infection is not developing. Contact a health care provider if:  You have a fever.  Your symptoms do not improve within 1-2 days of starting treatment.  Your bone or joint underneath the infected area becomes painful after the skin has healed.  Your infection returns in the same area or another area.  You notice a swollen bump in the infected area.  You develop new symptoms.  You have a general ill feeling (malaise) with muscle aches and pains. Get help right away if:  Your symptoms get worse.  You feel very sleepy.  You develop vomiting or diarrhea that persists.  You notice red streaks coming from the infected area.  Your red area gets larger or turns dark in color. This information is not intended to replace advice given to you by your health care provider. Make sure you discuss any questions you  have with your health care provider. Document Released: 08/23/2005 Document Revised: 03/23/2016 Document Reviewed: 09/22/2015 Elsevier Interactive Patient Education  [employer].  Please take medications as directed. Reduce wine consumption and avoid Acetaminophen use. Rest, Ice, Elevate right lower extremity. Follow-up in 2 weeks. FEEL BETTER!

## 2017-12-18 NOTE — Assessment & Plan Note (Signed)
Doxcycline 100mg  BID x 10 days She reports taking Doxy in the past without SE/reaction

## 2017-12-18 NOTE — Addendum Note (Signed)
Addended by: Leda MinPULLIAM, MELISSA D on: 12/18/2017 03:53 PM   Modules accepted: Orders

## 2017-12-18 NOTE — Assessment & Plan Note (Addendum)
Xray: FINDINGS: No evidence of fracture, dislocation, or joint effusion. Mild tricompartmental degenerative spurring is seen without joint space narrowing. Soft tissues are unremarkable.  IMPRESSION: No acute findings.  Mild tricompartmental osteoarthritis.  Meloxicam 7.5mg  BID PRN pain INSTRUCTED TO STOP ALL ACETAMINOPHEN USE! Rest, ice, elevate F/u in 2 weeks, if not better will send for MRI

## 2017-12-18 NOTE — Progress Notes (Addendum)
Subjective:    Patient ID: [name], female    DOB: 12/21/1945, 72 y.o.   MRN: [id number]  HPI: Ms Branscum suffered fall last Wednesday 12/12/17.  She toppled over trying to pick up a rock. She hit back of R head, R shoulder, R knee and "twisted her back". She denies striking her head or LOC.  She was unable to stand and had to crawl back to the house, which took almost 2 hrs. Current complaints: Thoracic back spasms/pain that are constant, rated 4/10 and described as "shotting pain". R knee pain that is constant, rated 6/10, and described as "throbbing/aching pain. Both areas of discomfort worsened by movement/prolonged walking/standing. She has been taking 1gm Acetaminophen and 800mg  Ibuprofen Q6H with good pain control. She continues to consume heavy amount of wine- 1 and half bottle wine/day. She is not on blood thinners.  Patient Care Team    Relationship Specialty Notifications Start End  Thomasene Lot, DO PCP - General Family Medicine  06/14/16   Monica Becton, MD Consulting Physician Sports Medicine  11/08/16    Comment: OA- knee injections; PT  Jerolyn Shin, MD Consulting Physician Neurology  09/25/17    Comment: neuropsych- seen him for pinched nerves in neck in past  Marykay Lex, MD Consulting Physician Cardiology  12/03/17     Patient Active Problem List   Diagnosis Date Noted  . Acute pain of right knee 12/18/2017  . Cellulitis of right leg 12/18/2017  . Peripheral neuropathy due to edema b/l feet 12/03/2017  . Decreased pedal pulses 10/27/2017  . Diastolic dysfunction with chronic heart failure (HCC) 10/25/2017  . Aortic systolic murmur on examination 10/02/2017  . Preoperative cardiovascular examination 10/02/2017  . Exertional dyspnea 09/25/2017  . Bilateral lower extremity edema 11/07/2016  . Primary osteoarthritis of both knees 11/02/2016  . Hypertriglyceridemia 09/29/2016  . HLD (hyperlipidemia) 09/29/2016  . Vitamin D deficiency  09/29/2016  . Adjustment disorder with mixed anxiety and depressed mood 09/29/2016  . Morbid obesity (HCC) 06/18/2016  . R Congenital Kidney ABN: Ess only has one fxn kidney 06/18/2016  . History of hysterectomy 06/18/2016  . H/O gastric bypass 06/18/2016  . Peripheral edema- b/l L Ext w chronic skin changes 06/18/2016  . Insomnia due to alcohol excess and stress 06/18/2016  . GAD (generalized anxiety disorder) 06/14/2016  . h/o Iron deficiency anemia 06/14/2016  . Myalgia and myositis 06/14/2016  . Hypothyroidism 02/13/2016  . Stress at home 02/13/2016  . UGIB (upper gastrointestinal bleed) 04/25/2015  . Hypertension 04/25/2015  . Alcohol abuse 04/25/2015  . Acute ulcer at gastrojejunal region- post gastric bypass   . Distal radius fracture, right 01/15/2013     Past Medical History:  Diagnosis Date  . Alcohol abuse 04/25/2015  . Allergy   . Anastomotic ulcer S/P gastric bypass 06/18/2016  . Arthritis   . Cataract   . Degenerative disc disease, cervical   . GAD (generalized anxiety disorder) 06/14/2016  . HTN (hypertension) 04/25/2015  . Hypertension   . Hypothyroidism   . 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 Substance abuse (HCC)    Alcohol abuse 2016  . UGIB (upper gastrointestinal bleed) 04/25/2015     Past Surgical History:  Procedure Laterality Date  . CHOLECYSTECTOMY    . COSMETIC SURGERY    . DIAGNOSTIC LAPAROSCOPY    . ESOPHAGOGASTRODUODENOSCOPY N/A 04/25/2015   Procedure: ESOPHAGOGASTRODUODENOSCOPY (EGD);  Surgeon: Beverley Fiedler, MD;  Location:  [facility];  Service: Endoscopy;  Laterality: N/A;  . FRACTURE SURGERY    . GASTRIC BYPASS    . NM MYOVIEW LTD  10/10/2017   Normal LV size and function EF 72%.  Medium sized mild severity defect in the apical septal apical lateral and apical wall suggestive of breast attenuation.  LOW RISK.  No ischemia or infarction.    . OPEN REDUCTION INTERNAL FIXATION (ORIF) DISTAL  RADIAL FRACTURE Right 01/15/2013   Procedure: OPEN REDUCTION INTERNAL FIXATION (ORIF) Right DISTAL RADIUS FRACTURE;  Surgeon: Eldred MangesMark C Yates, MD;  Location: [facility];  Service: Orthopedics;  Laterality: Right;  . TRANSTHORACIC ECHOCARDIOGRAM  10/04/2017   Mild LVH.  EF 60-65%.  Grade 2/moderate diastolic dysfunction.  Mild pulmonary hypertension.  No valve lesions     Family History  Problem Relation Age of Onset  . Heart disease Mother   . Stroke Mother 1690  . Cancer Mother        skin and breast  . Hypertension Mother   . Breast cancer Mother 5580  . COPD Father   . Cancer Father   . Diabetes Father   . Hypertension Father   . Stroke Maternal Grandmother   . Mental illness Maternal Grandmother   . Breast cancer Sister   . Cancer Cousin 5040     Social History   Substance and Sexual Activity  Drug Use No     Social History   Substance and Sexual Activity  Alcohol Use Yes  . Alcohol/week: 16.8 oz  . Types: 28 Glasses of wine per week   Comment: drinks daily and has a bottle of wine per day     Social History   Tobacco Use  Smoking Status Never Smoker  Smokeless Tobacco Never Used     Outpatient Encounter Medications as of 12/18/2017  Medication Sig  . acetaminophen (TYLENOL) 325 MG tablet Take 650 mg by mouth every 6 (six) hours as needed.  . benazepril-hydrochlorthiazide (LOTENSIN HCT) 20-25 MG tablet Take 1 tablet by mouth daily.  . carvedilol (COREG) 6.25 MG tablet Take 1 tablet (6.25 mg total) by mouth 2 (two) times daily with a meal.  . Cholecalciferol (VITAMIN D3) 5000 units TABS 5,000 IU OTC vitamin D3 daily.  . cyanocobalamin (,VITAMIN B-12,) 1000 MCG/ML injection Inject 1 mL (1,000 mcg total) into the muscle every 30 (thirty) days. Please dispense 1 mL vials  . diphenhydrAMINE (BENADRYL) 25 MG tablet Take 25 mg by mouth daily. Patient takes 1-2 nightly  . ferrous sulfate 325 (65 FE) MG tablet Take 325 mg by mouth daily with breakfast.  . furosemide (LASIX) 40  MG tablet Take 0.5 tablets (20 mg total) by mouth daily.  Marland Kitchen. gabapentin (NEURONTIN) 300 MG capsule One tab PO qHS for 3d, then BID for 3d, then TID.  Marland Kitchen. ibuprofen (ADVIL,MOTRIN) 200 MG tablet Take 800 mg by mouth 2 (two) times daily as needed.  Marland Kitchen. levothyroxine (SYNTHROID, LEVOTHROID) 150 MCG tablet Take 1 tablet (150 mcg total) by mouth daily.  . magnesium 30 MG tablet Take 30 mg by mouth 2 (two) times a week. Patient takes prn and takes 1 gram  . Melatonin 5 MG CAPS Take 10 mg by mouth at bedtime.   . Multiple Vitamin (MULTIVITAMIN) tablet Take 1 tablet by mouth daily.  . pantoprazole (PROTONIX) 40 MG tablet Take 40 mg by mouth daily.  . cyclobenzaprine (FLEXERIL) 10 MG tablet Take 1 tablet (10 mg total) by mouth 3 (three) times daily as needed for muscle  spasms.  Marland Kitchen doxycycline (VIBRA-TABS) 100 MG tablet Take 1 tablet (100 mg total) by mouth 2 (two) times daily.  . meloxicam (MOBIC) 7.5 MG tablet Take 1 tablet (7.5 mg total) by mouth 2 (two) times daily as needed for pain.   No facility-administered encounter medications on file as of 12/18/2017.     Allergies: Cocoa; Red dye; Lyrica [pregabalin]; and Sulfa antibiotics  Body mass index is 44.1 kg/m.  Blood pressure 108/65, pulse 76, height 5\' 5"  (1.651 m), weight 265 lb (120.2 kg), SpO2 98 %.    Review of Systems  Constitutional: Positive for activity change and fatigue. Negative for appetite change, chills, diaphoresis, fever and unexpected weight change.  Respiratory: Negative for cough, chest tightness, shortness of breath, wheezing and stridor.   Cardiovascular: Negative for chest pain, palpitations and leg swelling.  Gastrointestinal: Negative for abdominal pain.  Musculoskeletal: Positive for arthralgias, back pain, gait problem, joint swelling and myalgias. Negative for neck pain and neck stiffness.  Skin: Positive for color change. Negative for pallor, rash and wound.  Neurological: Negative for dizziness and headaches.        Objective:   Physical Exam  Constitutional: She is oriented to person, place, and time. She appears well-developed and well-nourished. No distress.  HENT:  Head: Normocephalic and atraumatic.  Right Ear: External ear normal.  Left Ear: External ear normal.  One, small contusion noted on R occipital area  Cardiovascular: Normal rate, regular rhythm and intact distal pulses.  Murmur heard. Pulmonary/Chest: Effort normal and breath sounds normal.  Musculoskeletal: She exhibits edema and tenderness.       Right shoulder: Normal.       Left shoulder: Normal.       Right elbow: Normal.      Right wrist: Normal.       Right knee: She exhibits decreased range of motion, swelling and ecchymosis. Tenderness found. Medial joint line and lateral joint line tenderness noted.       Left knee: Normal.       Right ankle: She exhibits swelling and ecchymosis.       Left ankle: Normal.       Thoracic back: She exhibits tenderness and spasm. She exhibits normal range of motion.  Ecchymosis on R FA She can full extend R knee, however limited flexion 2+ edema of R Knee RLE- reddened/warm/tight skin with some weaping noted just above ankle Pedal pulses palpable but weak  Neurological: She is alert and oriented to person, place, and time.  Skin: Skin is warm and dry. No rash noted. She is not diaphoretic. There is erythema. No pallor.  Psychiatric: She has a normal mood and affect. Her behavior is normal. Judgment and thought content normal.          Assessment & Plan:   1. Acute pain of right knee   2. Essential hypertension   3. Cellulitis of right leg     Acute pain of right knee Xray: FINDINGS: No evidence of fracture, dislocation, or joint effusion. Mild tricompartmental degenerative spurring is seen without joint space narrowing. Soft tissues are unremarkable.  IMPRESSION: No acute findings.  Mild tricompartmental osteoarthritis.  Meloxicam 7.5mg  BID PRN pain INSTRUCTED TO  STOP ALL ACETAMINOPHEN USE! Rest, ice, elevate F/u in 2 weeks, if not better will send for MRI  Hypertension BP at goal 108/65, HR 76  Cellulitis of right leg Doxcycline 100mg  BID x 10 days She reports taking Doxy in the past without SE/reaction  Pt was in  the office today for 25+ minutes, with over 50% time spent in face to face counseling of patient's various medical conditions and in coordination of care  FOLLOW-UP:  Return in about 2 weeks (around 01/01/2018).

## 2017-12-18 NOTE — Assessment & Plan Note (Signed)
BP at goal 108/65, HR 76

## 2017-12-20 ENCOUNTER — Telehealth: Payer: Self-pay | Admitting: Family Medicine

## 2017-12-20 NOTE — Telephone Encounter (Signed)
Patient called states medical assistant left message for her to call office--glh

## 2017-12-20 NOTE — Telephone Encounter (Signed)
See result notes. 

## 2017-12-21 ENCOUNTER — Telehealth: Payer: Self-pay | Admitting: Family Medicine

## 2017-12-21 ENCOUNTER — Other Ambulatory Visit: Payer: Self-pay

## 2017-12-21 DIAGNOSIS — M25561 Pain in right knee: Secondary | ICD-10-CM

## 2017-12-21 DIAGNOSIS — M170 Bilateral primary osteoarthritis of knee: Secondary | ICD-10-CM

## 2017-12-21 MED ORDER — TRAMADOL HCL 50 MG PO TABS: 50.0000 mg | ORAL_TABLET | Freq: Three times a day (TID) | ORAL | 0 refills | Status: DC | PRN

## 2017-12-21 NOTE — Telephone Encounter (Signed)
Per Dr. Sharee Holsterpalski Tramadol written for the patient - hard copy sign and patient notified. MPulliam, CMA/RT(R)

## 2017-12-21 NOTE — Telephone Encounter (Signed)
Patient saw Orpha BurKaty earlier in the week (is a Dr. Val Eagle patient). Katy prescribed her Mobic but she called in this morning stating the Mobic isn't doing much to help and wants to speak with someone clinical about what else she could take

## 2017-12-27 ENCOUNTER — Encounter: Payer: Self-pay | Admitting: Family Medicine

## 2017-12-27 ENCOUNTER — Telehealth: Payer: Self-pay | Admitting: Family Medicine

## 2017-12-27 ENCOUNTER — Ambulatory Visit (HOSPITAL_COMMUNITY)
Admission: RE | Admit: 2017-12-27 | Discharge: 2017-12-27 | Disposition: A | Discharge status: Home / Self Care | Payer: Medicare Other | Source: Ambulatory Visit | Attending: Vascular Surgery | Admitting: Vascular Surgery

## 2017-12-27 ENCOUNTER — Other Ambulatory Visit: Payer: Self-pay

## 2017-12-27 ENCOUNTER — Ambulatory Visit (INDEPENDENT_AMBULATORY_CARE_PROVIDER_SITE_OTHER): Payer: Medicare Other | Admitting: Family Medicine

## 2017-12-27 VITALS — BP 136/79 | HR 85 | Ht 65.0 in | Wt 265.0 lb

## 2017-12-27 DIAGNOSIS — L03119 Cellulitis of unspecified part of limb: Secondary | ICD-10-CM

## 2017-12-27 DIAGNOSIS — S8991XD Unspecified injury of right lower leg, subsequent encounter: Secondary | ICD-10-CM

## 2017-12-27 DIAGNOSIS — X58XXXD Exposure to other specified factors, subsequent encounter: Secondary | ICD-10-CM | POA: Diagnosis not present

## 2017-12-27 DIAGNOSIS — L02419 Cutaneous abscess of limb, unspecified: Secondary | ICD-10-CM | POA: Diagnosis not present

## 2017-12-27 DIAGNOSIS — S79921D Unspecified injury of right thigh, subsequent encounter: Secondary | ICD-10-CM

## 2017-12-27 MED ORDER — CLINDAMYCIN HCL 300 MG PO CAPS: 300.0000 mg | ORAL_CAPSULE | Freq: Every day | ORAL | 0 refills | Status: DC

## 2017-12-27 NOTE — Patient Instructions (Signed)
Keep your leg elevated above the level of your heart is much as possible.  No alcohol at all.  Use warm compresses to the area.  Please finish your doxycycline tonight and tomorrow dose and start clindamycin tonight.  Instead of 450 mg of Clinda 4 times daily we will do 300 mg 5 times daily.  You will need a higher dose since you had a nonresponse to the prior antibiotics.  Despite this more aggressive treatment if you develop fever chills, nausea vomiting and feel ill, you will need to go to the emergency room for IV antibiotics as well as an MRI or other imaging to rule out osteomyelitis and worsening infection.  Please come follow-up and see me on Monday-3 days from now

## 2017-12-27 NOTE — Telephone Encounter (Signed)
Patient called worried that bruise & fever @ Rt groin site(where she strain muscle due to fall last week) might turn into a DVT request provider/ nurse call her back w/ advice on weather to continue Ice & heat therapy. Please call at [phone].

## 2017-12-27 NOTE — Progress Notes (Signed)
Pt here for an acute care OV today   Impression and Recommendations:    1. Cellulitis and abscess of leg     1. Cellulitis and abscess of leg  -Finish doxycycline tonight and tomorrow and start clindamycin tonight. 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   -Apply a warm compress to the area.  -Elevate your leg above your heart. Keep off your feet as much as possible. -Push fluid and get plenty of rest.  -Do not drink alcohol as this can hinder your immune system and reduce the effectiveness of your abx.  -If you develop systemic fever and chills with temp >100, go to the ER immediately for IV abx and MRI to r/o osteomyelitis   -Follow up in 3 days.  Meds ordered this encounter  Medications  . clindamycin (CLEOCIN) 300 MG capsule    Sig: Take 1 capsule (300 mg total) by mouth 5 (five) times daily.    Dispense:  50 capsule    Refill:  0      Education and routine counseling performed. Handouts provided  Gross side effects, risk and benefits, and alternatives of medications and treatment plan in general discussed with patient.  Patient is aware that all medications have potential side effects and we are unable to predict every side effect or drug-drug interaction that may occur.   Patient will call with any questions prior to using medication if they have concerns.  Expresses verbal understanding and consents to current therapy and treatment regimen.  No barriers to understanding were identified.  Red flag symptoms and signs discussed in detail.  Patient expressed understanding regarding what to do in case of emergency\urgent symptoms   Please see AVS handed out to patient at the end of our visit for further patient instructions/ counseling done pertaining to today's office visit.   Return for For follow-up this Monday.     Note: This document was prepared occasionally using Dragon voice recognition software and may include unintentional  dictation errors in addition to a scribe.  This document serves as a record of services personally performed by Thomasene Lot, DO. It was created on her behalf by Thelma Barge, a trained medical scribe. The creation of this record is based on the scribe's personal observations and the provider's statements to them.   I have reviewed the above medical documentation for accuracy and completeness and I concur.  Thomasene Lot 12/31/17 4:09 PM  --------------------------------------------------------------------------------------------------------------------------------------------------------------------------------------------------------------------------------------------    Subjective:    CC:  Chief Complaint  Patient presents with  . Follow-up    right leg - redness and swelling upper thigh - patient had recent fall     HPI: [name] is a 72 y.o. female who presents to [facility] today for issues as discussed below.  Cellulitis She was seen in the office by William Hamburger, NP on 12-18-17 after a fall on her right knee. She was diagnosed with cellulitis to her right leg and was prescribed doxycycline for 10 days. Afterwards, her swelling and bruising has improved, but since yesterday, it has been more swollen, more hot, and more tender, as well as spreading up her leg. She states her leg was initially "squishy", but now has been more tight. She has not been alcohol-free after being on abx. She went for an [place] this morning that ruled out DVT.    Wt Readings from Last 3 Encounters:  12/31/17 266 lb (  120.7 kg)  12/27/17 265 lb (120.2 kg)  12/18/17 265 lb (120.2 kg)   BP Readings from Last 3 Encounters:  12/31/17 (!) 150/80  12/27/17 136/79  12/18/17 108/65   BMI Readings from Last 3 Encounters:  12/31/17 44.26 kg/m  12/27/17 44.10 kg/m  12/18/17 44.10 kg/m     Patient Care Team    Relationship Specialty Notifications Start End    Thomasene Lot, DO PCP - General Family Medicine  06/14/16   Monica Becton, MD Consulting Physician Sports Medicine  11/08/16    Comment: OA- knee injections; PT  Jerolyn Shin, MD Consulting Physician Neurology  09/25/17    Comment: neuropsych- seen him for pinched nerves in neck in past  Marykay Lex, MD Consulting Physician Cardiology  12/03/17      Patient Active Problem List   Diagnosis Date Noted  . Hypertriglyceridemia 09/29/2016    Priority: High  . HLD (hyperlipidemia) 09/29/2016    Priority: High  . Adjustment disorder with mixed anxiety and depressed mood 09/29/2016    Priority: High  . Morbid obesity (HCC) 06/18/2016    Priority: High  . R Congenital Kidney ABN: Ess only has one fxn kidney 06/18/2016    Priority: High  . Hypertension 04/25/2015    Priority: High  . Alcohol abuse 04/25/2015    Priority: High  . H/O gastric bypass 06/18/2016    Priority: Medium  . Peripheral edema- b/l L Ext w chronic skin changes 06/18/2016    Priority: Medium  . GAD (generalized anxiety disorder) 06/14/2016    Priority: Medium  . UGIB (upper gastrointestinal bleed) 04/25/2015    Priority: Medium  . Acute ulcer at gastrojejunal region- post gastric bypass     Priority: Medium  . Vitamin D deficiency 09/29/2016    Priority: Low  . h/o Iron deficiency anemia 06/14/2016    Priority: Low  . Hypothyroidism 02/13/2016    Priority: Low  . Acute pain of right knee 12/18/2017  . Cellulitis of right leg 12/18/2017  . Peripheral neuropathy due to edema b/l feet 12/03/2017  . Decreased pedal pulses 10/27/2017  . Diastolic dysfunction with chronic heart failure (HCC) 10/25/2017  . Aortic systolic murmur on examination 10/02/2017  . Preoperative cardiovascular examination 10/02/2017  . Exertional dyspnea 09/25/2017  . Bilateral lower extremity edema 11/07/2016  . Primary osteoarthritis of both knees 11/02/2016  . History of hysterectomy 06/18/2016  . Insomnia due to  alcohol excess and stress 06/18/2016  . Myalgia and myositis 06/14/2016  . Stress at home 02/13/2016  . Distal radius fracture, right 01/15/2013    Past Medical history, Surgical history, Family history, Social history, Allergies and Medications have been entered into the medical record, reviewed and changed as needed.    Current Meds  Medication Sig  . acetaminophen (TYLENOL) 325 MG tablet Take 650 mg by mouth every 6 (six) hours as needed.  . benazepril-hydrochlorthiazide (LOTENSIN HCT) 20-25 MG tablet Take 1 tablet by mouth daily.  . carvedilol (COREG) 6.25 MG tablet Take 1 tablet (6.25 mg total) by mouth 2 (two) times daily with a meal.  . Cholecalciferol (VITAMIN D3) 5000 units TABS 5,000 IU OTC vitamin D3 daily.  . cyanocobalamin (,VITAMIN B-12,) 1000 MCG/ML injection Inject 1 mL (1,000 mcg total) into the muscle every 30 (thirty) days. Please dispense 1 mL vials  . cyclobenzaprine (FLEXERIL) 10 MG tablet Take 1 tablet (10 mg total) by mouth 3 (three) times daily as needed for muscle spasms.  . diphenhydrAMINE (BENADRYL) 25 MG  tablet Take 25 mg by mouth daily. Patient takes 1-2 nightly  . doxycycline (VIBRA-TABS) 100 MG tablet Take 1 tablet (100 mg total) by mouth 2 (two) times daily. (Patient not taking: Reported on 12/31/2017)  . ferrous sulfate 325 (65 FE) MG tablet Take 325 mg by mouth daily with breakfast.  . furosemide (LASIX) 40 MG tablet Take 0.5 tablets (20 mg total) by mouth daily.  Marland Kitchen. gabapentin (NEURONTIN) 300 MG capsule One tab PO qHS for 3d, then BID for 3d, then TID.  Marland Kitchen. ibuprofen (ADVIL,MOTRIN) 200 MG tablet Take 800 mg by mouth 2 (two) times daily as needed.  Marland Kitchen. levothyroxine (SYNTHROID, LEVOTHROID) 150 MCG tablet Take 1 tablet (150 mcg total) by mouth daily.  . magnesium 30 MG tablet Take 30 mg by mouth 2 (two) times a week. Patient takes prn and takes 1 gram  . Melatonin 5 MG CAPS Take 10 mg by mouth at bedtime.   . Multiple Vitamin (MULTIVITAMIN) tablet Take 1 tablet by  mouth daily.  . pantoprazole (PROTONIX) 40 MG tablet Take 40 mg by mouth daily.  . traMADol (ULTRAM) 50 MG tablet Take 1 tablet (50 mg total) by mouth every 8 (eight) hours as needed.    Allergies:  Allergies  Allergen Reactions  . Cocoa Anaphylaxis  . Red Dye Hives    Rash and Nausea diarrhea  . Lyrica [Pregabalin] Other (See Comments)    Abnormal bleeding  . Sulfa Antibiotics Rash     Review of Systems: General:   Denies fever, chills, unexplained weight loss.  Optho/Auditory:   Denies visual changes, blurred vision/LOV Respiratory:   Denies wheeze, DOE more than baseline levels.  Cardiovascular:   Denies chest pain, palpitations, new onset peripheral edema  Gastrointestinal:   Denies nausea, vomiting, diarrhea, abd pain.  Genitourinary: Denies dysuria, freq/ urgency, flank pain or discharge from genitals.  Endocrine:     Denies hot or cold intolerance, polyuria, polydipsia. Musculoskeletal:   Denies unexplained myalgias, joint swelling, unexplained arthralgias, gait problems.  Skin:  Denies new onset rash, suspicious lesions Neurological:     Denies dizziness, unexplained weakness, numbness  Psychiatric/Behavioral:   Denies mood changes, suicidal or homicidal ideations, hallucinations    Objective:   Blood pressure 136/79, pulse 85, height 5\' 5"  (1.651 m), weight 265 lb (120.2 kg), SpO2 98 %. Body mass index is 44.1 kg/m. General:  Well Developed, well nourished, appropriate for stated age.  Neuro:  Alert and oriented,  extra-ocular muscles intact  HEENT:  Normocephalic, atraumatic, neck supple Skin:  erythema and skin taut from proximal aspect of R inner thigh to distal R leg. Increased erythema, TTP. No pus or fluctuance appreciated. Increased warmth and erythema.  Cardiac:  RRR, S1 S2 Respiratory:  ECTA B/L and A/P, Not using accessory muscles, speaking in full sentences- unlabored. Vascular:  Ext warm, no cyanosis apprec.; cap RF less 2 sec. Psych:  No HI/SI,  judgement and insight good, Euthymic mood. Full Affect.

## 2017-12-27 NOTE — Telephone Encounter (Signed)
Spoke with Dr. Sharee Holsterpalski who stated to have the pt get an ultrasound to r/o DVT and then she is to be seen in the office.  Pt scheduled with [facility] for 2pm today and then OV with Dr. Sharee Holsterpalski at 3:30pm.  Pt informed.  Pt expressed understanding and is agreeable.  [name]. Patsey Pitstick, CMA

## 2017-12-27 NOTE — Progress Notes (Signed)
ult

## 2017-12-31 ENCOUNTER — Encounter: Payer: Self-pay | Admitting: Family Medicine

## 2017-12-31 ENCOUNTER — Ambulatory Visit (INDEPENDENT_AMBULATORY_CARE_PROVIDER_SITE_OTHER): Payer: Medicare Other | Admitting: Family Medicine

## 2017-12-31 VITALS — BP 150/80 | HR 70 | Ht 65.0 in | Wt 266.0 lb

## 2017-12-31 DIAGNOSIS — M25561 Pain in right knee: Secondary | ICD-10-CM | POA: Diagnosis not present

## 2017-12-31 DIAGNOSIS — L03119 Cellulitis of unspecified part of limb: Secondary | ICD-10-CM | POA: Diagnosis not present

## 2017-12-31 DIAGNOSIS — F1010 Alcohol abuse, uncomplicated: Secondary | ICD-10-CM | POA: Diagnosis not present

## 2017-12-31 DIAGNOSIS — L02419 Cutaneous abscess of limb, unspecified: Secondary | ICD-10-CM | POA: Diagnosis not present

## 2017-12-31 NOTE — Progress Notes (Signed)
Impression and Recommendations:    1. Cellulitis and abscess of leg   2. Acute pain of right knee   3. h/o Alcohol abuse     1. Cellulitis of Right Lower Extremity - Patient measured her own temperature in office today and found it to be 98.7.  - Reminded the patient that if she's running fevers, she needs to go in immediately to get IV antibiotics and an MRI.  - Given her common bladder leaking, the patient was advised to begin wearing Depends.  - Patient may continue on gabapentin at one tablet, twice daily - once in the morning, and once at night (after dinner).  She may also continue taking tramadol - only if she needs it.  - Patient was advised and reminded that her appetite loss is due to her current high dose of clindamycin.  - Congratulated the patient on her sobriety and encouraged her to continue avoiding alcohol.  - Reminded the patient that she should continue to baby herself and take special care of herself until her redness and leg cellulitis get back to normal.  - Patient encouraged to strive toward more physical activity.  She may begin with 5-10 minutes of activity per day, eventually aiming for 150 minutes of cardiovascular activity per week according to guidelines established by the AHA.  - If her leg starts to swell more or exacerbate after activity, she will lie down and elevate it, place heat on it to get the antibiotics to move down her leg.  - She may ice her ankle for joint pain if it hurts.  Reminded the patient that all joint pain may be iced as needed.  2. Follow-Up - After her cellulitis is resolved, we may need to pursue further treatment with her chronic knee pain.  - Follow up in 2 months.  She can always call if she needs to come in sooner.   No orders of the defined types were placed in this encounter.   No orders of the defined types were placed in this encounter.   Gross side effects, risk and benefits, and alternatives of  medications and treatment plan in general discussed with patient.  Patient is aware that all medications have potential side effects and we are unable to predict every side effect or drug-drug interaction that may occur.   Patient will call with any questions prior to using medication if they have concerns.  Expresses verbal understanding and consents to current therapy and treatment regimen.  No barriers to understanding were identified.  Red flag symptoms and signs discussed in detail.  Patient expressed understanding regarding what to do in case of emergency\urgent symptoms  Please see AVS handed out to patient at the end of our visit for further patient instructions/ counseling done pertaining to today's office visit.   Return for 47mo f/up chronic issues.    Note: This note was prepared with assistance of Dragon voice recognition software. Occasional wrong-word or sound-a-like substitutions may have occurred due to the inherent limitations of voice recognition software.   This document serves as a record of services personally performed by Thomasene Lot, DO. It was created on her behalf by Peggye Fothergill, a trained medical scribe. The creation of this record is based on the scribe's personal observations and the provider's statements to them.   I have reviewed the above medical documentation for accuracy and completeness and I concur.  Thomasene Lot 01/30/18 2:47 PM   ------------------------------------------------------------------------------------------------------------------------------------------------------------------------------------    Subjective:  HPI: [name] is a 72 y.o. female who presents to [facility] at [facility] today for issues as discussed below.  Cellulitis of Right Lower Extremity  She's been feeling very, very tired, especially after being out and about (walking around to the bathroom, to the kitchen, etc.).  Her  cellulitis feels a lot better.  She notes that it has been "a lot less hot and a lot less red."  She note that it's sore, and if she's on her feet for a while it can hurt.  But she has been "the poster child for following directions."  She is tolerating her medicines alright, but notes that nothing she eats lately tends to taste good these days.  She is still taking the high dose of clindamycin.  She has not been taking the Lasix - stopped last Thursday but plans to restart it this week.  She stopped because she has been trying to limit how many times she needs to get up.  She leaks urine when she gets up, and is afraid of peeing herself before she gets to the bathroom.  She was running 99.2 last night, but felt like it "didn't feel systemic."  Patient measured her temperature in office today and found it to be 98.7.  Patient has stopped drinking alcohol.  Her first thought in not drinking was "maybe I'll lose weight" - because she calculated her bottle of wine at about 550 calories per day.  She really wants to stay sober, and her husband is putting his away too.  He has not been drinking around her.  Her husband said he wants to do this together.  No fever or chills, no nausea or vomiting, no new symptoms.  She has cut back to almost nothing on the gabapentin because her mouth was getting so dry she could barely talk.  Reports that she can live with the nerve tingling, but the pain is what she needs to manage.  She currently has plans to take one tablet in the morning, and one at night.  She knows to continue to update [place] about her medications.  She was taking the tramadol every eight hours, but today she hasn't.  She is not checking her blood pressure at home.   Wt Readings from Last 3 Encounters:  12/31/17 266 lb (120.7 kg)  12/27/17 265 lb (120.2 kg)  12/18/17 265 lb (120.2 kg)   BP Readings from Last 3 Encounters:  12/31/17 (!) 150/80  12/27/17 136/79  12/18/17 108/65   Pulse  Readings from Last 3 Encounters:  12/31/17 70  12/27/17 85  12/18/17 76   BMI Readings from Last 3 Encounters:  12/31/17 44.26 kg/m  12/27/17 44.10 kg/m  12/18/17 44.10 kg/m     Patient Care Team    Relationship Specialty Notifications Start End  Thomasene Lot, DO PCP - General Family Medicine  06/14/16   Monica Becton, MD Consulting Physician Sports Medicine  11/08/16    Comment: OA- knee injections; PT  Jerolyn Shin, MD Consulting Physician Neurology  09/25/17    Comment: neuropsych- seen him for pinched nerves in neck in past  Marykay Lex, MD Consulting Physician Cardiology  12/03/17      Patient Active Problem List   Diagnosis Date Noted  . Hypertriglyceridemia 09/29/2016    Priority: High  . HLD (hyperlipidemia) 09/29/2016    Priority: High  . Adjustment disorder with mixed anxiety and depressed mood 09/29/2016    Priority: High  .  Morbid obesity (HCC) 06/18/2016    Priority: High  . R Congenital Kidney ABN: Ess only has one fxn kidney 06/18/2016    Priority: High  . Hypertension 04/25/2015    Priority: High  . Alcohol abuse 04/25/2015    Priority: High  . H/O gastric bypass 06/18/2016    Priority: Medium  . Peripheral edema- b/l L Ext w chronic skin changes 06/18/2016    Priority: Medium  . GAD (generalized anxiety disorder) 06/14/2016    Priority: Medium  . UGIB (upper gastrointestinal bleed) 04/25/2015    Priority: Medium  . Acute ulcer at gastrojejunal region- post gastric bypass     Priority: Medium  . Vitamin D deficiency 09/29/2016    Priority: Low  . h/o Iron deficiency anemia 06/14/2016    Priority: Low  . Hypothyroidism 02/13/2016    Priority: Low  . Acute pain of right knee 12/18/2017  . Cellulitis of right leg 12/18/2017  . Peripheral neuropathy due to edema b/l feet 12/03/2017  . Decreased pedal pulses 10/27/2017  . Diastolic dysfunction with chronic heart failure (HCC) 10/25/2017  . Aortic systolic murmur on  examination 10/02/2017  . Preoperative cardiovascular examination 10/02/2017  . Exertional dyspnea 09/25/2017  . Bilateral lower extremity edema 11/07/2016  . Primary osteoarthritis of both knees 11/02/2016  . History of hysterectomy 06/18/2016  . Insomnia due to alcohol excess and stress 06/18/2016  . Myalgia and myositis 06/14/2016  . Stress at home 02/13/2016  . Distal radius fracture, right 01/15/2013    Past Medical history, Surgical history, Family history, Social history, Allergies and Medications have been entered into the medical record, reviewed and changed as needed.    Current Meds  Medication Sig  . acetaminophen (TYLENOL) 325 MG tablet Take 650 mg by mouth every 6 (six) hours as needed.  . benazepril-hydrochlorthiazide (LOTENSIN HCT) 20-25 MG tablet Take 1 tablet by mouth daily.  . carvedilol (COREG) 6.25 MG tablet Take 1 tablet (6.25 mg total) by mouth 2 (two) times daily with a meal.  . Cholecalciferol (VITAMIN D3) 5000 units TABS 5,000 IU OTC vitamin D3 daily.  . clindamycin (CLEOCIN) 300 MG capsule Take 1 capsule (300 mg total) by mouth 5 (five) times daily.  . cyanocobalamin (,VITAMIN B-12,) 1000 MCG/ML injection Inject 1 mL (1,000 mcg total) into the muscle every 30 (thirty) days. Please dispense 1 mL vials  . cyclobenzaprine (FLEXERIL) 10 MG tablet Take 1 tablet (10 mg total) by mouth 3 (three) times daily as needed for muscle spasms.  . diphenhydrAMINE (BENADRYL) 25 MG tablet Take 25 mg by mouth daily. Patient takes 1-2 nightly  . ferrous sulfate 325 (65 FE) MG tablet Take 325 mg by mouth daily with breakfast.  . gabapentin (NEURONTIN) 300 MG capsule One tab PO qHS for 3d, then BID for 3d, then TID.  Marland Kitchen ibuprofen (ADVIL,MOTRIN) 200 MG tablet Take 800 mg by mouth 2 (two) times daily as needed.  . magnesium 30 MG tablet Take 30 mg by mouth 2 (two) times a week. Patient takes prn and takes 1 gram  . Melatonin 5 MG CAPS Take 10 mg by mouth at bedtime.   . Multiple  Vitamin (MULTIVITAMIN) tablet Take 1 tablet by mouth daily.  . pantoprazole (PROTONIX) 40 MG tablet Take 40 mg by mouth daily.  . traMADol (ULTRAM) 50 MG tablet Take 1 tablet (50 mg total) by mouth every 8 (eight) hours as needed.  . [DISCONTINUED] levothyroxine (SYNTHROID, LEVOTHROID) 150 MCG tablet Take 1 tablet (150 mcg total) by  mouth daily.    Allergies:  Allergies  Allergen Reactions  . Cocoa Anaphylaxis  . Red Dye Hives    Rash and Nausea diarrhea  . Lyrica [Pregabalin] Other (See Comments)    Abnormal bleeding  . Sulfa Antibiotics Rash     Review of Systems:  A fourteen system review of systems was performed and found to be positive as per HPI.   Objective:   Blood pressure (!) 150/80, pulse 70, height 5\' 5"  (1.651 m), weight 266 lb (120.7 kg). Body mass index is 44.26 kg/m. General:  Well Developed, well nourished, appropriate for stated age.  Neuro:  Alert and oriented,  extra-ocular muscles intact  HEENT:  Normocephalic, atraumatic, neck supple, no carotid bruits appreciated  Skin:  no gross rash, warm, pink. Cardiac:  RRR, S1 S2 Respiratory:  ECTA B/L and A/P, Not using accessory muscles, speaking in full sentences- unlabored. Vascular:  Ext warm, no cyanosis apprec.; cap RF less 2 sec. Psych:  No HI/SI, judgement and insight good, Euthymic mood. Full Affect. Cellulitis of Right LE: Improved erythema and swelling of the right leg.

## 2018-01-02 ENCOUNTER — Ambulatory Visit: Payer: Medicare Other | Admitting: Family Medicine

## 2018-01-03 ENCOUNTER — Other Ambulatory Visit: Payer: Self-pay | Admitting: Family Medicine

## 2018-01-03 DIAGNOSIS — E039 Hypothyroidism, unspecified: Secondary | ICD-10-CM

## 2018-01-16 ENCOUNTER — Other Ambulatory Visit: Payer: Self-pay | Admitting: Physician Assistant

## 2018-01-16 DIAGNOSIS — I10 Essential (primary) hypertension: Secondary | ICD-10-CM

## 2018-01-24 ENCOUNTER — Ambulatory Visit: Payer: Medicare Other | Admitting: Cardiology

## 2018-02-13 ENCOUNTER — Other Ambulatory Visit: Payer: Self-pay | Admitting: Family Medicine

## 2018-02-13 DIAGNOSIS — G629 Polyneuropathy, unspecified: Secondary | ICD-10-CM

## 2018-02-13 DIAGNOSIS — M792 Neuralgia and neuritis, unspecified: Principal | ICD-10-CM

## 2018-03-04 ENCOUNTER — Encounter: Payer: Self-pay | Admitting: Family Medicine

## 2018-03-04 ENCOUNTER — Ambulatory Visit (INDEPENDENT_AMBULATORY_CARE_PROVIDER_SITE_OTHER): Payer: Medicare Other | Admitting: Family Medicine

## 2018-03-04 DIAGNOSIS — I10 Essential (primary) hypertension: Secondary | ICD-10-CM | POA: Diagnosis not present

## 2018-03-04 DIAGNOSIS — E538 Deficiency of other specified B group vitamins: Secondary | ICD-10-CM | POA: Insufficient documentation

## 2018-03-04 DIAGNOSIS — E781 Pure hyperglyceridemia: Secondary | ICD-10-CM

## 2018-03-04 DIAGNOSIS — M1710 Unilateral primary osteoarthritis, unspecified knee: Secondary | ICD-10-CM

## 2018-03-04 DIAGNOSIS — Z9884 Bariatric surgery status: Secondary | ICD-10-CM

## 2018-03-04 DIAGNOSIS — F1010 Alcohol abuse, uncomplicated: Secondary | ICD-10-CM

## 2018-03-04 DIAGNOSIS — M179 Osteoarthritis of knee, unspecified: Secondary | ICD-10-CM

## 2018-03-04 DIAGNOSIS — E782 Mixed hyperlipidemia: Secondary | ICD-10-CM

## 2018-03-04 DIAGNOSIS — R600 Localized edema: Secondary | ICD-10-CM

## 2018-03-04 DIAGNOSIS — E559 Vitamin D deficiency, unspecified: Secondary | ICD-10-CM

## 2018-03-04 MED ORDER — MELOXICAM 7.5 MG PO TABS: ORAL_TABLET | ORAL | 0 refills | Status: DC

## 2018-03-04 NOTE — Progress Notes (Signed)
Impression and Recommendations:    1. Morbid obesity (HCC)   2. Alcohol abuse-with elevated ALT and AST   3. Essential hypertension   4. Hypertriglyceridemia   5. Mixed hyperlipidemia   6. H/O gastric bypass   7. B12 deficiency (status post gastric bypass)    8. Vitamin D deficiency   9. Bilateral lower extremity edema   10. Osteoarthritis of knee, unspecified laterality, unspecified osteoarthritis type     1. Morbid obesity- -She has lost 11 lbs since last OV 2 months ago. -continue losing weight.  -continue diet and exercise.  2. Alcohol abuse-with elevated ALT/AST -recheck in 6 months. -reduce alcoholic consumption to 2 glasses per day.   3. Essential HTN- -BP well controlled in office today. Pt asymptomatic and tolerating meds well without complication. Continue meds as listed below. -Continue checking BP at home and keep a log. Bring this into next visit.   4. Hypertriglyceridemia -Reduce intake of saturated, trans fats. -reduce alcohol intake.  -Recheck FLP in near future.  5. Mixed Hyperlipidemia-  -ASCVD 10 year risk is 12.1%. -AHA dietary and exercise guidelines discussed. -recheck fasting lipid profile.  6. H/o gastric bypass/Vit B12 deficiency -pt takes B12 injections q monthly.  -Recheck b12 labs in near future.   7. Vitamin D deficiency -continue supplements.  -recheck in near future.    8. Bilateral lower extremity edema -Keep your skin clean and dry. Use bandages and wrap the area.  -elevate your knee above your heart when sitting.  9. OA Knee- -Start Mobic. -Ice and elevate your knee.  -Discuss with sports medicine specialist who can evaluate further. She has seen Dr. Benjamin Stain before.   -Drink adequate amounts of water daily, equal to half of your body weight in oz per day.   Orders Placed This Encounter  Procedures  . Comprehensive metabolic panel  . CBC with Differential/Platelet  . Lipid panel  . T4, free  . TSH  .  VITAMIN D 25 Hydroxy (Vit-D Deficiency, Fractures)  . Vitamin B12    Meds ordered this encounter  Medications  . meloxicam (MOBIC) 7.5 MG tablet    Sig: 1-2 tablet orally daily as needed arthritis pain    Dispense:  60 tablet    Refill:  0    Gross side effects, risk and benefits, and alternatives of medications and treatment plan in general discussed with patient.  Patient is aware that all medications have potential side effects and we are unable to predict every side effect or drug-drug interaction that may occur.   Patient will call with any questions prior to using medication if they have concerns.  Expresses verbal understanding and consents to current therapy and treatment regimen.  No barriers to understanding were identified.  Red flag symptoms and signs discussed in detail.  Patient expressed understanding regarding what to do in case of emergency\urgent symptoms  Please see AVS handed out to patient at the end of our visit for further patient instructions/ counseling done pertaining to today's office visit.   Return for 3-4 mo f/up chronic conditions.    Note: This note was prepared with assistance of Dragon voice recognition software. Occasional wrong-word or sound-a-like substitutions may have occurred due to the inherent limitations of voice recognition software.  This document serves as a record of services personally performed by Thomasene Lot, DO. It was created on her behalf by Thelma Barge, a trained medical scribe. The creation of this record is based on the scribe's personal  observations and the provider's statements to them.   I have reviewed the above medical documentation for accuracy and completeness and I concur.  Thomasene Lot 03/05/18 6:20  PM   --------------------------------------------------------------------------------------------------------------------------------------------------------------------------------------------------------------------------------------------    Subjective:     HPI: [name] is a 72 y.o. female who presents to [facility] at [facility] today for issues as discussed below.  She has a h/o hepatitis and gastric bypass.  Skin Pt has been seen for several acute visits for abscesses and contact dermatitis. She states last week her L leg has started swelling again. She denies warmth. She states she had a blister, it is just serous and not purulent.  She is walking daily around the neighborhood and in department stores a little bit. She typically walks 20-25 minutes.   Diet/exercise She has not changed her drinking habits at all, she "has good days and bad days". She states she had a binge drink before her recent dentist appointment. She has been eating more seafood about 3x/week. She has lost 11 lbs since last OV, 2 months ago.   Vitamin B12 She has been taking her vitamin b12 injections q monthly.   Knee She also has joint pains and aches in her knees. She has seen Dr. Karie Schwalbe at sports medicine before.     Wt Readings from Last 3 Encounters:  03/04/18 254 lb 14.4 oz (115.6 kg)  12/31/17 266 lb (120.7 kg)  12/27/17 265 lb (120.2 kg)   BP Readings from Last 3 Encounters:  03/04/18 120/71  12/31/17 (!) 150/80  12/27/17 136/79   Pulse Readings from Last 3 Encounters:  03/04/18 79  12/31/17 70  12/27/17 85   BMI Readings from Last 3 Encounters:  03/04/18 42.42 kg/m  12/31/17 44.26 kg/m  12/27/17 44.10 kg/m     Patient Care Team    Relationship Specialty Notifications Start End  Thomasene Lot, DO PCP - General Family Medicine  06/14/16   Monica Becton, MD Consulting Physician Sports Medicine  11/08/16    Comment: OA- knee injections; PT   Jerolyn Shin, MD Consulting Physician Neurology  09/25/17    Comment: neuropsych- seen him for pinched nerves in neck in past  Marykay Lex, MD Consulting Physician Cardiology  12/03/17      Patient Active Problem List   Diagnosis Date Noted  . Hypertriglyceridemia 09/29/2016    Priority: High  . HLD (hyperlipidemia) 09/29/2016    Priority: High  . Adjustment disorder with mixed anxiety and depressed mood 09/29/2016    Priority: High  . Morbid obesity (HCC) 06/18/2016    Priority: High  . R Congenital Kidney ABN: Ess only has one fxn kidney 06/18/2016    Priority: High  . Hypertension 04/25/2015    Priority: High  . Alcohol abuse-with elevated ALT and AST 04/25/2015    Priority: High  . H/O gastric bypass 06/18/2016    Priority: Medium  . Peripheral edema- b/l L Ext w chronic skin changes 06/18/2016    Priority: Medium  . GAD (generalized anxiety disorder) 06/14/2016    Priority: Medium  . UGIB (upper gastrointestinal bleed) 04/25/2015    Priority: Medium  . Acute ulcer at gastrojejunal region- post gastric bypass     Priority: Medium  . Vitamin D deficiency 09/29/2016    Priority: Low  . h/o Iron deficiency anemia 06/14/2016    Priority: Low  . Hypothyroidism 02/13/2016    Priority: Low  . B12 deficiency 03/04/2018  . Acute pain of right  knee 12/18/2017  . Cellulitis of right leg 12/18/2017  . Peripheral neuropathy due to edema b/l feet 12/03/2017  . Decreased pedal pulses 10/27/2017  . Diastolic dysfunction with chronic heart failure (HCC) 10/25/2017  . Aortic systolic murmur on examination 10/02/2017  . Preoperative cardiovascular examination 10/02/2017  . Exertional dyspnea 09/25/2017  . Bilateral lower extremity edema 11/07/2016  . Primary osteoarthritis of both knees 11/02/2016  . History of hysterectomy 06/18/2016  . Insomnia due to alcohol excess and stress 06/18/2016  . Myalgia and myositis 06/14/2016  . Stress at home 02/13/2016  . Distal radius  fracture, right 01/15/2013    Past Medical history, Surgical history, Family history, Social history, Allergies and Medications have been entered into the medical record, reviewed and changed as needed.    Current Meds  Medication Sig  . acetaminophen (TYLENOL) 325 MG tablet Take 650 mg by mouth every 6 (six) hours as needed.  . benazepril-hydrochlorthiazide (LOTENSIN HCT) 20-25 MG tablet Take 1 tablet by mouth daily.  . carvedilol (COREG) 6.25 MG tablet Take 1 tablet (6.25 mg total) by mouth 2 (two) times daily with a meal.  . Cholecalciferol (VITAMIN D3) 5000 units TABS 5,000 IU OTC vitamin D3 daily.  . cyanocobalamin (,VITAMIN B-12,) 1000 MCG/ML injection Inject 1 mL (1,000 mcg total) into the muscle every 30 (thirty) days. Please dispense 1 mL vials  . diphenhydrAMINE (BENADRYL) 25 MG tablet Take 25 mg by mouth daily. Patient takes 1-2 nightly  . ferrous sulfate 325 (65 FE) MG tablet Take 325 mg by mouth daily with breakfast.  . furosemide (LASIX) 40 MG tablet Take 0.5 tablets (20 mg total) by mouth daily.  Marland Kitchen. gabapentin (NEURONTIN) 300 MG capsule TAKE 1 CAPSULE BY MOUTH AT BEDTIME FOR 3 DAYS, 1 CAPSULE 2 TIMES A DAY FOR 3 DAYS, THEN 1 CAPSULE 3 TIMES A DAY.  Marland Kitchen. ibuprofen (ADVIL,MOTRIN) 200 MG tablet Take 800 mg by mouth 2 (two) times daily as needed.  Marland Kitchen. levothyroxine (SYNTHROID, LEVOTHROID) 150 MCG tablet TAKE 1 TABLET BY MOUTH DAILY.  . magnesium 30 MG tablet Take 30 mg by mouth 2 (two) times a week. Patient takes prn and takes 1 gram  . Melatonin 5 MG CAPS Take 10 mg by mouth at bedtime.   . Multiple Vitamin (MULTIVITAMIN) tablet Take 1 tablet by mouth daily.  . pantoprazole (PROTONIX) 40 MG tablet Take 40 mg by mouth daily.    Allergies:  Allergies  Allergen Reactions  . Cocoa Anaphylaxis  . Red Dye Hives    Rash and Nausea diarrhea  . Lyrica [Pregabalin] Other (See Comments)    Abnormal bleeding  . Sulfa Antibiotics Rash     Review of Systems:  A fourteen system review of  systems was performed and found to be positive as per HPI.   Objective:   Blood pressure 120/71, pulse 79, height 5\' 5"  (1.651 m), weight 254 lb 14.4 oz (115.6 kg), SpO2 96 %. Body mass index is 42.42 kg/m. General:  Well Developed, well nourished, appropriate for stated age.  Neuro:  Alert and oriented,  extra-ocular muscles intact  HEENT:  Normocephalic, atraumatic, neck supple, no carotid bruits appreciated  Skin:  no gross rash, warm, pink. Chronic venous statis changes bilaterally, slightly improved from prior. Cardiac:  RRR, S1 S2 Respiratory:  ECTA B/L and A/P, Not using accessory muscles, speaking in full sentences- unlabored. Vascular:  Ext warm, no cyanosis apprec.; cap RF less 2 sec. Psych:  No HI/SI, judgement and insight good, Euthymic mood. Full  Affect.

## 2018-03-04 NOTE — Patient Instructions (Addendum)
Please come in in the near future for fasting blood work to recheck your cholesterol, liver enzymes, B12, vitamin D etc. -Is important you continue to try to cut back on the alcohol intake to no more than 2 glasses of wine daily. -If your cholesterol is still elevated, again I will recommend going on a statin if you meet the criteria to do so, and if your liver enzymes are improved from prior. -Please continue to work on diet and lifestyle changes as its resulting in an 11 pound weight loss since early February when I last saw you.  Good job and keep up the good work. -Continue to drink adequate amounts of water daily as well as ambulate to a goal of 20-30 minutes every day.  This will again help with the swelling in her lower extremities.

## 2018-03-06 ENCOUNTER — Other Ambulatory Visit: Payer: Medicare Other

## 2018-03-11 ENCOUNTER — Encounter: Payer: Self-pay | Admitting: Family Medicine

## 2018-03-11 ENCOUNTER — Ambulatory Visit (INDEPENDENT_AMBULATORY_CARE_PROVIDER_SITE_OTHER): Payer: Medicare Other | Admitting: Family Medicine

## 2018-03-11 ENCOUNTER — Encounter: Payer: Self-pay | Admitting: Sports Medicine

## 2018-03-11 ENCOUNTER — Ambulatory Visit (INDEPENDENT_AMBULATORY_CARE_PROVIDER_SITE_OTHER): Payer: Medicare Other | Admitting: Sports Medicine

## 2018-03-11 ENCOUNTER — Other Ambulatory Visit: Payer: Medicare Other

## 2018-03-11 DIAGNOSIS — R600 Localized edema: Secondary | ICD-10-CM

## 2018-03-11 DIAGNOSIS — Z9884 Bariatric surgery status: Secondary | ICD-10-CM

## 2018-03-11 DIAGNOSIS — E782 Mixed hyperlipidemia: Secondary | ICD-10-CM

## 2018-03-11 DIAGNOSIS — F1010 Alcohol abuse, uncomplicated: Secondary | ICD-10-CM | POA: Diagnosis not present

## 2018-03-11 DIAGNOSIS — E781 Pure hyperglyceridemia: Secondary | ICD-10-CM

## 2018-03-11 DIAGNOSIS — E538 Deficiency of other specified B group vitamins: Secondary | ICD-10-CM

## 2018-03-11 DIAGNOSIS — E559 Vitamin D deficiency, unspecified: Secondary | ICD-10-CM

## 2018-03-11 DIAGNOSIS — M170 Bilateral primary osteoarthritis of knee: Secondary | ICD-10-CM | POA: Diagnosis not present

## 2018-03-11 DIAGNOSIS — I872 Venous insufficiency (chronic) (peripheral): Secondary | ICD-10-CM | POA: Insufficient documentation

## 2018-03-11 DIAGNOSIS — I10 Essential (primary) hypertension: Secondary | ICD-10-CM

## 2018-03-11 MED ORDER — CLINDAMYCIN HCL 300 MG PO CAPS: 300.0000 mg | ORAL_CAPSULE | Freq: Four times a day (QID) | ORAL | 0 refills | Status: DC

## 2018-03-11 MED ORDER — MUPIROCIN 2 % EX OINT: TOPICAL_OINTMENT | CUTANEOUS | 0 refills | Status: DC

## 2018-03-11 MED ORDER — TRAMADOL HCL 50 MG PO TABS: 50.0000 mg | ORAL_TABLET | Freq: Three times a day (TID) | ORAL | 0 refills | Status: DC | PRN

## 2018-03-11 NOTE — Assessment & Plan Note (Signed)
Previous injection procedure was 1.5 years ago, recurrence of pain, aspiration and injection of the left knee, injection of the right knee. She does have some venous stasis ulcers that she is going to discuss with her PCP. [place] tramadol for breakthrough pain on her trip.

## 2018-03-11 NOTE — Progress Notes (Signed)
Pt here for an acute care OV today   Impression and Recommendations:    1. Morbid obesity (HCC)   2. Alcohol abuse-with elevated ALT and AST   3. Bilateral lower extremity edema   4. Chronic venous stasis dermatitis of both lower extremities     Left Lower Extremity - Advised patient that she will have lessened wound healing, but her left leg does not currently appear red, pustulent, or infected.  - Advised patient to perform care using wet to dry dressing to absorb the seeping liquid.    - Keep wound covered with antimicrobial pads while travelling in new environments and while in [place].  Keep it elevated as much as possible.  - Antibiotic ointment prescribed today - see med list.  - Reviewed the use of antibiotics in depth with the patient today.  Advised patient not to use antibiotics prophylactically because if she does, this could dampen her response to antibiotics in the future and potentially inhibit her from treatment of worse infection.  - Try to drink more water and less alcohol.  Patient knows that when she wasn't drinking as much, her healing improved.  - Advised patient that when she drinks alcohol, this lessens her immune system, and adds more solutes to her body which causes her legs to swell more.  Advised that by drinking so much alcohol, she is setting herself up for worsening fluids in her legs, swelling, and priming herself for infection.  Advised patient that she needs to stop drinking.  - If she goes to the [facility], patient will have those results CC'ed to [place].   Meds ordered this encounter  Medications  . mupirocin ointment (BACTROBAN) 2 %    Sig: Apply to affected wounds twice daily.    Dispense:  60 g    Refill:  0  . clindamycin (CLEOCIN) 300 MG capsule    Sig: Take 1 capsule (300 mg total) by mouth 4 (four) times daily.    Dispense:  40 capsule    Refill:  0     Education and routine counseling performed. Handouts provided  Gross  side effects, risk and benefits, and alternatives of medications and treatment plan in general discussed with patient.  Patient is aware that all medications have potential side effects and we are unable to predict every side effect or drug-drug interaction that may occur.   Patient will call with any questions prior to using medication if they have concerns.  Expresses verbal understanding and consents to current therapy and treatment regimen.  No barriers to understanding were identified.  Red flag symptoms and signs discussed in detail.  Patient expressed understanding regarding what to do in case of emergency\urgent symptoms   Please see AVS handed out to patient at the end of our visit for further patient instructions/ counseling done pertaining to today's office visit.   Return if symptoms worsen or fail to improve.     Note: This document was prepared occasionally using Dragon voice recognition software and may include unintentional dictation errors in addition to a scribe.  This document serves as a record of services personally performed by Thomasene Lot, DO. It was created on her behalf by Peggye Fothergill, a trained medical scribe. The creation of this record is based on the scribe's personal observations and the provider's statements to them.   I have reviewed the above medical documentation for accuracy and completeness and I concur.  Thomasene Lot 03/12/18 10:17 PM   ----------------------------------------------------------------------------------------------------------------------------------------------------------  Subjective:    CC:  Chief Complaint  Patient presents with  . Follow-up    HPI: [name] is a 72 y.o. female who [facility] today for issues as discussed below.  The [place] [facility] [place] and is worried about infection.  Plans to wear her high quality compression hose while  out of town.  Left Lower Extremity Notes that left leg got more swollen Thursday night "and the skin has split on the back."  Notes that a small weeping wound on the front of her left leg has expanded and is healing slowly.  Has been leaving her wound areas open to air, putting antibiotic ointment on them, and pattng them dry.  Notes that she tries to elevate her legs, but that she can "feel the texture of the pillows" through the pressure in her legs.  Overall feels it's getting better.  Is having her knee aspirated this afternoon  She has begun drinking again.  Denies fever, chills, nausea.    Patient has plans to visit the Wound Center in the future.   No problems updated.   Wt Readings from Last 3 Encounters:  03/11/18 256 lb (116.1 kg)  03/11/18 255 lb (115.7 kg)  03/04/18 254 lb 14.4 oz (115.6 kg)   BP Readings from Last 3 Encounters:  03/11/18 (!) 146/71  03/11/18 (!) 146/80  03/04/18 120/71   BMI Readings from Last 3 Encounters:  03/11/18 42.60 kg/m  03/11/18 42.43 kg/m  03/04/18 42.42 kg/m     Patient Care Team    Relationship Specialty Notifications Start End  Thomasene Lotpalski, Ryun Velez, DO PCP - General Family Medicine  06/14/16   Monica Bectonhekkekandam, Thomas J, MD Consulting Physician Sports Medicine  11/08/16    Comment: OA- knee injections; PT  Jerolyn ShinMieden, Gregory, MD Consulting Physician Neurology  09/25/17    Comment: neuropsych- seen him for pinched nerves in neck in past  Marykay LexHarding, David W, MD Consulting Physician Cardiology  12/03/17      Patient Active Problem List   Diagnosis Date Noted  . Hypertriglyceridemia 09/29/2016    Priority: High  . HLD (hyperlipidemia) 09/29/2016    Priority: High  . Adjustment disorder with mixed anxiety and depressed mood 09/29/2016    Priority: High  . Morbid obesity (HCC) 06/18/2016    Priority: High  . R Congenital Kidney ABN: Ess only has one fxn kidney 06/18/2016    Priority: High  . Hypertension 04/25/2015    Priority:  High  . Alcohol abuse-with elevated ALT and AST 04/25/2015    Priority: High  . H/O gastric bypass 06/18/2016    Priority: Medium  . Peripheral edema- b/l L Ext w chronic skin changes 06/18/2016    Priority: Medium  . GAD (generalized anxiety disorder) 06/14/2016    Priority: Medium  . UGIB (upper gastrointestinal bleed) 04/25/2015    Priority: Medium  . Acute ulcer at gastrojejunal region- post gastric bypass     Priority: Medium  . Vitamin D deficiency 09/29/2016    Priority: Low  . h/o Iron deficiency anemia 06/14/2016    Priority: Low  . Hypothyroidism 02/13/2016    Priority: Low  . Chronic venous stasis dermatitis of both lower extremities 03/11/2018  . B12 deficiency 03/04/2018  . Acute pain of right knee 12/18/2017  . Cellulitis of right leg 12/18/2017  . Peripheral neuropathy due to edema b/l feet 12/03/2017  . Decreased pedal pulses 10/27/2017  . Diastolic dysfunction  with chronic heart failure (HCC) 10/25/2017  . Aortic systolic murmur on examination 10/02/2017  . Preoperative cardiovascular examination 10/02/2017  . Exertional dyspnea 09/25/2017  . Bilateral lower extremity edema 11/07/2016  . Primary osteoarthritis of both knees 11/02/2016  . History of hysterectomy 06/18/2016  . Insomnia due to alcohol excess and stress 06/18/2016  . Myalgia and myositis 06/14/2016  . Stress at home 02/13/2016  . Distal radius fracture, right 01/15/2013    Past Medical history, Surgical history, Family history, Social history, Allergies and Medications have been entered into the medical record, reviewed and changed as needed.    Current Meds  Medication Sig  . acetaminophen (TYLENOL) 325 MG tablet Take 650 mg by mouth every 6 (six) hours as needed.  . benazepril-hydrochlorthiazide (LOTENSIN HCT) 20-25 MG tablet Take 1 tablet by mouth daily.  . carvedilol (COREG) 6.25 MG tablet Take 1 tablet (6.25 mg total) by mouth 2 (two) times daily with a meal.  . Cholecalciferol (VITAMIN  D3) 5000 units TABS 5,000 IU OTC vitamin D3 daily.  . cyanocobalamin (,VITAMIN B-12,) 1000 MCG/ML injection Inject 1 mL (1,000 mcg total) into the muscle every 30 (thirty) days. Please dispense 1 mL vials  . diphenhydrAMINE (BENADRYL) 25 MG tablet Take 25 mg by mouth daily. Patient takes 1-2 nightly  . ferrous sulfate 325 (65 FE) MG tablet Take 325 mg by mouth daily with breakfast.  . furosemide (LASIX) 40 MG tablet Take 0.5 tablets (20 mg total) by mouth daily.  Marland Kitchen gabapentin (NEURONTIN) 300 MG capsule TAKE 1 CAPSULE BY MOUTH AT BEDTIME FOR 3 DAYS, 1 CAPSULE 2 TIMES A DAY FOR 3 DAYS, THEN 1 CAPSULE 3 TIMES A DAY.  Marland Kitchen ibuprofen (ADVIL,MOTRIN) 200 MG tablet Take 800 mg by mouth 2 (two) times daily as needed.  Marland Kitchen levothyroxine (SYNTHROID, LEVOTHROID) 150 MCG tablet TAKE 1 TABLET BY MOUTH DAILY.  . magnesium 30 MG tablet Take 30 mg by mouth 2 (two) times a week. Patient takes prn and takes 1 gram  . Melatonin 5 MG CAPS Take 10 mg by mouth at bedtime.   . meloxicam (MOBIC) 7.5 MG tablet 1-2 tablet orally daily as needed arthritis pain  . Multiple Vitamin (MULTIVITAMIN) tablet Take 1 tablet by mouth daily.  . pantoprazole (PROTONIX) 40 MG tablet Take 40 mg by mouth daily.    Allergies:  Allergies  Allergen Reactions  . Cocoa Anaphylaxis  . Red Dye Hives    Rash and Nausea diarrhea  . Lyrica [Pregabalin] Other (See Comments)    Abnormal bleeding  . Sulfa Antibiotics Rash     Review of Systems: General:   Denies fever, chills, unexplained weight loss.  Optho/Auditory:   Denies visual changes, blurred vision/LOV Respiratory:   Denies wheeze, DOE more than baseline levels.  Cardiovascular:   Denies chest pain, palpitations, new onset peripheral edema  Gastrointestinal:   Denies nausea, vomiting, diarrhea, abd pain.  Genitourinary: Denies dysuria, freq/ urgency, flank pain or discharge from genitals.  Endocrine:     Denies hot or cold intolerance, polyuria, polydipsia. Musculoskeletal:    Denies unexplained myalgias, joint swelling, unexplained arthralgias, gait problems.  Skin:  Denies new onset rash, suspicious lesions Neurological:     Denies dizziness, unexplained weakness, numbness  Psychiatric/Behavioral:   Denies mood changes, suicidal or homicidal ideations, hallucinations    Objective:   Blood pressure (!) 146/80, pulse 76, height 5\' 5"  (1.651 m), weight 255 lb (115.7 kg), SpO2 98 %. Body mass index is 42.43 kg/m. General:  Well Developed,  well nourished, appropriate for stated age.  Neuro:  Alert and oriented,  extra-ocular muscles intact  HEENT:  Normocephalic, atraumatic, neck supple Skin:  no gross rash, warm, pink. Cardiac:  RRR, S1 S2 Respiratory:  ECTA B/L and A/P, Not using accessory muscles, speaking in full sentences- unlabored. Vascular:  Ext warm, no cyanosis apprec.; cap RF less 2 sec. Psych:  No HI/SI, judgement and insight good, Euthymic mood. Full Affect. Left Lower Extremity:  Bulla present, weeping skin and chronic venous stasis changes.  Weeping is clear without pus.  No erythema or evidence acutely of infection.

## 2018-03-11 NOTE — Progress Notes (Signed)
Subjective:    CC: Bilateral knee pain  HPI: This is a very pleasant 72 year old female, she used to be a Engineer, civil (consulting), we treated her for knee osteoarthritis about a year and a half ago with an aspiration and injection of both knees, she did extremely well, over the past few months she has had a gradual worsening of pain, medial joint line, swelling, moderate, persistent without radiation, no trauma, no mechanical symptoms.  She does have a trip coming up to [facility] on Wednesday.  I reviewed the past medical history, family history, social history, surgical history, and allergies today and no changes were needed.  Please see the problem list section below in epic for further details.  Past Medical History: Past Medical History:  Diagnosis Date  . Alcohol abuse 04/25/2015  . Allergy   . Anastomotic ulcer S/P gastric bypass 06/18/2016  . Arthritis   . Cataract   . Degenerative disc disease, cervical   . GAD (generalized anxiety disorder) 06/14/2016  . HTN (hypertension) 04/25/2015  . Hypertension   . Hypothyroidism   . 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 Substance abuse (HCC)    Alcohol abuse 2016  . UGIB (upper gastrointestinal bleed) 04/25/2015   Past Surgical History: Past Surgical History:  Procedure Laterality Date  . CHOLECYSTECTOMY    . COSMETIC SURGERY    . DIAGNOSTIC LAPAROSCOPY    . ESOPHAGOGASTRODUODENOSCOPY N/A 04/25/2015   Procedure: ESOPHAGOGASTRODUODENOSCOPY (EGD);  Surgeon: Beverley Fiedler, MD;  Location: [facility] END[facility]COPY;  Service: Endoscopy;  Laterality: N/A;  . FRACTURE SURGERY    . GASTRIC BYPASS    . NM MYOVIEW LTD  10/10/2017   Normal LV size and function EF 72%.  Medium sized mild severity defect in the apical septal apical lateral and apical wall suggestive of breast attenuation.  LOW RISK.  No ischemia or infarction.    . OPEN REDUCTION INTERNAL FIXATION (ORIF) DISTAL RADIAL FRACTURE Right 01/15/2013   Procedure: OPEN  REDUCTION INTERNAL FIXATION (ORIF) Right DISTAL RADIUS FRACTURE;  Surgeon: Eldred Manges, MD;  Location: [facility] OR;  Service: Orthopedics;  Laterality: Right;  . TRANSTHORACIC ECHOCARDIOGRAM  10/04/2017   Mild LVH.  EF 60-65%.  Grade 2/moderate diastolic dysfunction.  Mild pulmonary hypertension.  No valve lesions   Social History: Social History   Socioeconomic History  . Marital status: Married    Spouse name: Not on file  . Number of children: Not on file  . Years of education: Not on file  . Highest education level: Not on file  Occupational History  . Occupation: Charity fundraiser    Comment: ICU Sales executive  Social Needs  . Financial resource strain: Not on file  . Food insecurity:    Worry: Not on file    Inability: Not on file  . Transportation needs:    Medical: Not on file    Non-medical: Not on file  Tobacco Use  . Smoking status: Never Smoker  . Smokeless tobacco: Never Used  Substance and Sexual Activity  . Alcohol use: Yes    Alcohol/week: 16.8 oz    Types: 28 Glasses of wine per week    Comment: drinks daily and has a bottle of wine per day  . Drug use: No  . Sexual activity: Yes    Birth control/protection: Post-menopausal  Lifestyle  . Physical activity:    Days per week: Not on file    Minutes per session: Not on file  .  Stress: Not on file  Relationships  . Social connections:    Talks on phone: Not on file    Gets together: Not on file    Attends religious service: Not on file    Active member of club or organization: Not on file    Attends meetings of clubs or organizations: Not on file    Relationship status: Not on file  Other Topics Concern  . 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 Heart disease Mother   . Stroke Mother [id number]  .  Cancer Mother        skin and breast  . Hypertension Mother   . Breast cancer Mother [id number]  . COPD Father   . Cancer Father   . Diabetes Father   . Hypertension Father   . Stroke Maternal Grandmother   . Mental illness Maternal Grandmother   . Breast cancer Sister   . Cancer Cousin 40   Allergies: Allergies  Allergen Reactions  . Cocoa Anaphylaxis  . Red Dye Hives    Rash and Nausea diarrhea  . Lyrica [Pregabalin] Other (See Comments)    Abnormal bleeding  . Sulfa Antibiotics Rash   Medications: See med rec.  Review of Systems: No fevers, chills, night sweats, weight loss, chest pain, or shortness of breath.   Objective:    General: Well Developed, well nourished, and in no acute distress.  Neuro: Alert and oriented x3, extra-ocular muscles intact, sensation grossly intact.  HEENT: Normocephalic, atraumatic, pupils equal round reactive to light, neck supple, no masses, no lymphadenopathy, thyroid nonpalpable.  Skin: Warm and dry, no rashes. Cardiac: Regular rate and rhythm, no murmurs rubs or gallops, no lower extremity edema.  Respiratory: Clear to auscultation bilaterally. Not using accessory muscles, speaking in full sentences. Bilateral knees: Moderate swelling of both knees, left worse than right with medial joint line pain bilaterally ROM normal in flexion and extension and lower leg rotation. Ligaments with solid consistent endpoints including ACL, PCL, LCL, MCL. Negative Mcmurray's and provocative meniscal tests. Non painful patellar compression. Patellar and quadriceps tendons unremarkable. Hamstring and quadriceps strength is normal.  Procedure: Real-time Ultrasound Guided aspiration/injection of left knee Device: GE Logiq E  Verbal informed consent obtained.  Time-out conducted.  Noted no overlying erythema, induration, or other signs of local infection.  Skin prepped in a sterile fashion.  Local anesthesia: Topical Ethyl chloride.  With sterile technique  and under real time ultrasound guidance: Using an 18-gauge needle I aspirated 30 mL of clear, straw-colored fluid, syringe switched and 1 cc: 40, 2 cc lidocaine, 2 cc bupivacaine injected easily Completed without difficulty  Pain immediately resolved suggesting accurate placement of the medication.  Advised to call if fevers/chills, erythema, induration, drainage, or persistent bleeding.  Images permanently stored and available for review in the ultrasound unit.  Impression: Technically successful ultrasound guided injection.  Procedure: Real-time Ultrasound Guided Injection of right knee Device: GE Logiq E  Verbal informed consent obtained.  Time-out conducted.  Noted no overlying erythema, induration, or other signs of local infection.  Skin prepped in a sterile fashion.  Local anesthesia: Topical Ethyl chloride.  With sterile technique and under real time ultrasound guidance:  1 cc Kenalog 40, 2 cc lidocaine, 2 cc bupivacaine injected easily Completed without difficulty  Pain immediately resolved suggesting accurate placement of the medication.  Advised to call if fevers/chills, erythema, induration, drainage, or persistent bleeding.  Images permanently stored and available for review in the ultrasound unit.  Impression: Technically successful ultrasound guided injection.  Impression and Recommendations:    Primary osteoarthritis of both knees Previous injection procedure was 1.5 years ago, recurrence of pain, aspiration and injection of the left knee, injection of the right knee. She does have some venous stasis ulcers that she is going to discuss with her PCP. She is going to [facility], adding tramadol for breakthrough pain on her trip. ___________________________________________ Ihor Austin. Benjamin Stain, M.D., ABFM., CAQSM. Primary Care and Sports Medicine [facility]  Adjunct Instructor of Family Medicine  [employer]

## 2018-03-12 LAB — COMPREHENSIVE METABOLIC PANEL
ALT: 15 IU/L (ref 0–32)
AST: 23 IU/L (ref 0–40)
Albumin/Globulin Ratio: 1.6 (ref 1.2–2.2)
Albumin: 4.3 g/dL (ref 3.5–4.8)
Alkaline Phosphatase: 110 IU/L (ref 39–117)
BUN/Creatinine Ratio: 19 (ref 12–28)
BUN: 17 mg/dL (ref 8–27)
Bilirubin Total: 0.6 mg/dL (ref 0.0–1.2)
CO2: 27 mmol/L (ref 20–29)
Calcium: 9.3 mg/dL (ref 8.7–10.3)
Chloride: 89 mmol/L — ABNORMAL LOW (ref 96–106)
Creatinine, Ser: 0.89 mg/dL (ref 0.57–1.00)
GFR calc Af Amer: 75 mL/min/{1.73_m2} (ref >59)
GFR calc non Af Amer: 65 mL/min/{1.73_m2} (ref >59)
Globulin, Total: 2.7 g/dL (ref 1.5–4.5)
Glucose: 104 mg/dL — ABNORMAL HIGH (ref 65–99)
Potassium: 5.2 mmol/L (ref 3.5–5.2)
Sodium: 132 mmol/L — ABNORMAL LOW (ref 134–144)
Total Protein: 7 g/dL (ref 6.0–8.5)

## 2018-03-12 LAB — CBC WITH DIFFERENTIAL/PLATELET
Basophils Absolute: 0.1 10*3/uL (ref 0.0–0.2)
Basos: 2 %
EOS (ABSOLUTE): 0.3 10*3/uL (ref 0.0–0.4)
Eos: 4 %
Hematocrit: 39.4 % (ref 34.0–46.6)
Hemoglobin: 13.1 g/dL (ref 11.1–15.9)
Immature Grans (Abs): 0 10*3/uL (ref 0.0–0.1)
Immature Granulocytes: 0 %
Lymphocytes Absolute: 2.7 10*3/uL (ref 0.7–3.1)
Lymphs: 39 %
MCH: 35.4 pg — ABNORMAL HIGH (ref 26.6–33.0)
MCHC: 33.2 g/dL (ref 31.5–35.7)
MCV: 107 fL — ABNORMAL HIGH (ref 79–97)
Monocytes Absolute: 0.7 10*3/uL (ref 0.1–0.9)
Monocytes: 11 %
Neutrophils Absolute: 3 10*3/uL (ref 1.4–7.0)
Neutrophils: 44 %
Platelets: 356 10*3/uL (ref 150–379)
RBC: 3.7 x10E6/uL — ABNORMAL LOW (ref 3.77–5.28)
RDW: 13.5 % (ref 12.3–15.4)
WBC: 6.9 10*3/uL (ref 3.4–10.8)

## 2018-03-12 LAB — VITAMIN B12: Vitamin B-12: 193 pg/mL — ABNORMAL LOW (ref 232–1245)

## 2018-03-12 LAB — VITAMIN D 25 HYDROXY (VIT D DEFICIENCY, FRACTURES): Vit D, 25-Hydroxy: 21 ng/mL — ABNORMAL LOW (ref 30.0–100.0)

## 2018-03-12 LAB — LIPID PANEL
Chol/HDL Ratio: 3.3 ratio (ref 0.0–4.4)
Cholesterol, Total: 209 mg/dL — ABNORMAL HIGH (ref 100–199)
HDL: 64 mg/dL (ref >39)
LDL Calculated: 117 mg/dL — ABNORMAL HIGH (ref 0–99)
Triglycerides: 140 mg/dL (ref 0–149)
VLDL Cholesterol Cal: 28 mg/dL (ref 5–40)

## 2018-03-12 LAB — TSH: TSH: 3.74 u[IU]/mL (ref 0.450–4.500)

## 2018-03-12 LAB — T4, FREE: Free T4: 1.27 ng/dL (ref 0.82–1.77)

## 2018-03-13 ENCOUNTER — Other Ambulatory Visit: Payer: Self-pay

## 2018-03-13 MED ORDER — ATORVASTATIN CALCIUM 80 MG PO TABS: 80.0000 mg | ORAL_TABLET | Freq: Every day | ORAL | 0 refills | Status: DC

## 2018-04-01 ENCOUNTER — Telehealth: Payer: Self-pay

## 2018-04-01 NOTE — Telephone Encounter (Signed)
Please let patient know that her B12 levels were much better back in October we will recheck them when she was just on oral supplementation.  This was due to diet and due to the fact she was not drinking as much.  Please let her know that it is imperative that she stop the alcohol intake and eat foods rich in B12.     Beyond that, how many B12 injections has patient gotten since March and April and what was the schedule?  Meaning: Was it every other day for couple weeks, then every 2 weeks etc.   - Please let me know as I can't find those details in pt's chart anywhere.  I will need to know if we did a schedule of loading dose or if she has just been taking it once monthly.    ---> How did she take it in the past to achieve satisfactory levels?   -Also continue vitamin D supplement as taking

## 2018-04-01 NOTE — Telephone Encounter (Signed)
Received Epic notification that pt has not read MyChart message regarding results.     Recent lab results   From [name], CMA To [name] Sent 03/13/2018 8:27 AM  Dear [name],   Dr. Sharee [name] asked that I inform you that your labs showed that your cholesterol panel has worsened. Dr. Sharee [name] recommends that you start a statin due to an elevated risk greater than 17% for an acute sudden cardiovascular event such as a stroke or heart attack. We have sent a prescription to your pharmacy for Lipitor  one tablet at night. We will need to check your liver function test 6 weeks after starting this medication. Please call our office to schedule this lab appointment.   Also, your vitamin D and B12 levels are both too low. Are you taking supplements as written on your medication list? Please let us know if you are taking these supplements and how much you are taking so that we may document how much and how often you take them so Dr. Sharee [name] can determine the next best steps in your treatment plan.   Otherwise, all of your other labs are normal.   Please follow up as previously discussed with Dr. Sharee [name].   Wishing you well,  [name], CMA   [address]   MyChart User Last Read On  [name] Not Read      Pt informed of results.  Pt expressed understanding. Pt states that she has taken the B12 injections in March and April and is currently taking 3 of the 5000IU tablets daily of Vitamin D.  Please advise if you would like to make any changes.  [name], CMA

## 2018-04-02 NOTE — Telephone Encounter (Signed)
LVM for pt to call to discuss.  T. Nelson, CMA  

## 2018-04-03 ENCOUNTER — Telehealth: Payer: Self-pay | Admitting: Family Medicine

## 2018-04-03 NOTE — Telephone Encounter (Signed)
Patient called states rcvd a message from [place] to call office.  --forwarding message to medical assistant. -glh

## 2018-04-04 NOTE — Telephone Encounter (Signed)
Ok thnx.  Please let her know that her B12 is still too low.  She will need to do a B12 injection every 2 weeks for the next couple of months then before her fifth injection we will need to draw a B12 level on her.    Please schedule this so that the B12 lab is in there and can be done.  Please stress importance to patient and that if she does not come back to have the levels checked, we will not continue to prescribe her the B12.    Please do not refill her medication until we do check the level in exactly 2 and half months

## 2018-04-04 NOTE — Telephone Encounter (Signed)
Pt informed.  Pt expressed understanding and is agreeable.  Pt states that she will call back to schedule lab appt for B12 level to be done before 05/30/18.  Tiajuana Amass, CMA

## 2018-04-04 NOTE — Telephone Encounter (Signed)
Pt states that she is unable to take po B12 due to bypass of her duodenum and part of her jejunum.  She only took one injection of B12 in March and one in April.

## 2018-04-04 NOTE — Telephone Encounter (Signed)
See additional documentation on phone message.  [name], CMA

## 2018-04-08 ENCOUNTER — Other Ambulatory Visit: Payer: Self-pay | Admitting: Family Medicine

## 2018-04-08 DIAGNOSIS — E039 Hypothyroidism, unspecified: Secondary | ICD-10-CM

## 2018-04-26 ENCOUNTER — Other Ambulatory Visit: Payer: Self-pay | Admitting: Family Medicine

## 2018-04-26 DIAGNOSIS — I10 Essential (primary) hypertension: Secondary | ICD-10-CM

## 2018-04-29 ENCOUNTER — Other Ambulatory Visit: Payer: Self-pay | Admitting: Family Medicine

## 2018-04-29 DIAGNOSIS — M792 Neuralgia and neuritis, unspecified: Principal | ICD-10-CM

## 2018-04-29 DIAGNOSIS — G629 Polyneuropathy, unspecified: Secondary | ICD-10-CM

## 2018-04-29 MED ORDER — PANTOPRAZOLE SODIUM 40 MG PO TBEC: 40.0000 mg | DELAYED_RELEASE_TABLET | Freq: Every day | ORAL | 1 refills | Status: DC

## 2018-04-29 NOTE — Telephone Encounter (Signed)
I assume you are talking about the Neurontin?  I apologize but I already approved as the refill request came in a different message which I already approved so you will have to change it.  I recommend if patient does not have good control of her pain on her current sig of the medicine then she should take 3 capsules 3 times daily to keep pain under good control and not allow it to get out of hand-so she can stay ahead of it and does not have to sometimes take 5 and sometimes take 10 etc.  Please advise her that and send a new prescription for 300 mg Neurontin, take 3 caps p.o. 3 times daily.  Dispense 270 with 1 refill.  Please have patient follow-up with me and let me know how this new dose is going for her

## 2018-04-29 NOTE — Telephone Encounter (Addendum)
Patient is requesting 5-10 caps per day.  She states that she usually takes 5 times/day but sometimes she takes more due to pain.  Patient is also requesting a refill on Protonix that was previous prescribed in 2017 by [facility] after patient's ulcers hemorrhaged.  Please advise. MPulliam, CMA/RT(R)

## 2018-04-30 ENCOUNTER — Other Ambulatory Visit: Payer: Self-pay

## 2018-04-30 DIAGNOSIS — G629 Polyneuropathy, unspecified: Secondary | ICD-10-CM

## 2018-04-30 DIAGNOSIS — M792 Neuralgia and neuritis, unspecified: Principal | ICD-10-CM

## 2018-04-30 MED ORDER — GABAPENTIN 300 MG PO CAPS: 900.0000 mg | ORAL_CAPSULE | Freq: Three times a day (TID) | ORAL | 1 refills | Status: DC

## 2018-05-08 ENCOUNTER — Other Ambulatory Visit (INDEPENDENT_AMBULATORY_CARE_PROVIDER_SITE_OTHER): Payer: Medicare Other

## 2018-05-08 DIAGNOSIS — Z79899 Other long term (current) drug therapy: Secondary | ICD-10-CM

## 2018-05-09 LAB — SPECIMEN STATUS

## 2018-05-10 LAB — ALT: ALT: 18 IU/L (ref 0–32)

## 2018-05-21 ENCOUNTER — Ambulatory Visit (INDEPENDENT_AMBULATORY_CARE_PROVIDER_SITE_OTHER): Payer: Medicare Other | Admitting: Sports Medicine

## 2018-05-21 ENCOUNTER — Encounter: Payer: Self-pay | Admitting: Sports Medicine

## 2018-05-21 DIAGNOSIS — M170 Bilateral primary osteoarthritis of knee: Secondary | ICD-10-CM

## 2018-05-21 MED ORDER — TRAMADOL HCL 50 MG PO TABS: 50.0000 mg | ORAL_TABLET | Freq: Three times a day (TID) | ORAL | 0 refills | Status: DC | PRN

## 2018-05-21 NOTE — Progress Notes (Signed)
Subjective:    CC: Bilateral knee pain  HPI: This is a pleasant 72 year old female, we did an aspiration and injection about 2 months ago.  She had an excellent initial but short-lived 43-month response, now having a recurrence of pain and swelling in both knees, left worse than right, moderate, persistent.  No radiation or mechanical symptoms, no constitutional symptoms.  I reviewed the past medical history, family history, social history, surgical history, and allergies today and no changes were needed.  Please see the problem list section below in epic for further details.  Past Medical History: Past Medical History:  Diagnosis Date  . Alcohol abuse 04/25/2015  . Allergy   . Anastomotic ulcer S/P gastric bypass 06/18/2016  . Arthritis   . Cataract   . Degenerative disc disease, cervical   . GAD (generalized anxiety disorder) 06/14/2016  . HTN (hypertension) 04/25/2015  . Hypertension   . Hypothyroidism   . 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 Substance abuse (HCC)    Alcohol abuse 2016  . UGIB (upper gastrointestinal bleed) 04/25/2015   Past Surgical History: Past Surgical History:  Procedure Laterality Date  . CHOLECYSTECTOMY    . COSMETIC SURGERY    . DIAGNOSTIC LAPAROSCOPY    . ESOPHAGOGASTRODUODENOSCOPY N/A 04/25/2015   Procedure: ESOPHAGOGASTRODUODENOSCOPY (EGD);  Surgeon: Beverley Fiedler, MD;  Location: [facility] ENDOSCOPY;  Service: Endoscopy;  Laterality: N/A;  . FRACTURE SURGERY    . GASTRIC BYPASS    . NM MYOVIEW LTD  10/10/2017   Normal LV size and function EF 72%.  Medium sized mild severity defect in the apical septal apical lateral and apical wall suggestive of breast attenuation.  LOW RISK.  No ischemia or infarction.    . OPEN REDUCTION INTERNAL FIXATION (ORIF) DISTAL RADIAL FRACTURE Right 01/15/2013   Procedure: OPEN REDUCTION INTERNAL FIXATION (ORIF) Right DISTAL RADIUS FRACTURE;  Surgeon: Eldred Manges, MD;  Location: [facility]  OR;  Service: Orthopedics;  Laterality: Right;  . TRANSTHORACIC ECHOCARDIOGRAM  10/04/2017   Mild LVH.  EF 60-65%.  Grade 2/moderate diastolic dysfunction.  Mild pulmonary hypertension.  No valve lesions   Social History: Social History   Socioeconomic History  . Marital status: Married    Spouse name: Not on file  . Number of children: Not on file  . Years of education: Not on file  . Highest education level: Not on file  Occupational History  . Occupation: Charity fundraiser    Comment: ICU Sales executive  Social Needs  . Financial resource strain: Not on file  . Food insecurity:    Worry: Not on file    Inability: Not on file  . Transportation needs:    Medical: Not on file    Non-medical: Not on file  Tobacco Use  . Smoking status: Never Smoker  . Smokeless tobacco: Never Used  Substance and Sexual Activity  . Alcohol use: Yes    Alcohol/week: 16.8 oz    Types: 28 Glasses of wine per week    Comment: drinks daily and has a bottle of wine per day  . Drug use: No  . Sexual activity: Yes    Birth control/protection: Post-menopausal  Lifestyle  . Physical activity:    Days per week: Not on file    Minutes per session: Not on file  . Stress: Not on file  Relationships  . Social connections:    Talks on phone: Not on file    Gets  together: Not on file    Attends religious service: Not on file    Active member of club or organization: Not on file    Attends meetings of clubs or organizations: Not on file    Relationship status: Not on file  Other Topics Concern  . 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 Heart disease Mother   . Stroke Mother 34  . Cancer Mother        skin and breast  . Hypertension Mother   . Breast cancer Mother 29  . COPD Father     . Cancer Father   . Diabetes Father   . Hypertension Father   . Stroke Maternal Grandmother   . Mental illness Maternal Grandmother   . Breast cancer Sister   . Cancer Cousin 40   Allergies: Allergies  Allergen Reactions  . Cocoa Anaphylaxis  . Red Dye Hives    Rash and Nausea diarrhea  . Lyrica [Pregabalin] Other (See Comments)    Abnormal bleeding  . Sulfa Antibiotics Rash   Medications: See med rec.  Review of Systems: No fevers, chills, night sweats, weight loss, chest pain, or shortness of breath.   Objective:    General: Well Developed, well nourished, and in no acute distress.  Neuro: Alert and oriented x3, extra-ocular muscles intact, sensation grossly intact.  HEENT: Normocephalic, atraumatic, pupils equal round reactive to light, neck supple, no masses, no lymphadenopathy, thyroid nonpalpable.  Skin: Warm and dry, no rashes. Cardiac: Regular rate and rhythm, no murmurs rubs or gallops, no lower extremity edema.  Respiratory: Clear to auscultation bilaterally. Not using accessory muscles, speaking in full sentences. Bilateral knees: Tender to palpation at the medial joint line bilaterally with a palpable fluid wave left worse than right. ROM normal in flexion and extension and lower leg rotation. Ligaments with solid consistent endpoints including ACL, PCL, LCL, MCL. Negative Mcmurray's and provocative meniscal tests. Non painful patellar compression. Patellar and quadriceps tendons unremarkable. Hamstring and quadriceps strength is normal.  Procedure: Real-time Ultrasound Guided aspiration/injection of right knee Device: GE Logiq E  Verbal informed consent obtained.  Time-out conducted.  Noted no overlying erythema, induration, or other signs of local infection.  Skin prepped in a sterile fashion.  Local anesthesia: Topical Ethyl chloride.  With sterile technique and under real time ultrasound guidance: Using an 18-gauge needle I aspirated approximately 10 mL  of clear, straw-colored fluid, syringe switched and 30 mg/2 mL of OrthoVisc (sodium hyaluronate) in a prefilled syringe was injected easily into the knee. Completed without difficulty  Pain immediately resolved suggesting accurate placement of the medication.  Advised to call if fevers/chills, erythema, induration, drainage, or persistent bleeding.  Images permanently stored and available for review in the ultrasound unit.  Impression: Technically successful ultrasound guided injection.  Procedure: Real-time Ultrasound Guided aspiration/injection of left knee Device: GE Logiq E  Verbal informed consent obtained.  Time-out conducted.  Noted no overlying erythema, induration, or other signs of local infection.  Skin prepped in a sterile fashion.  Local anesthesia: Topical Ethyl chloride.  With sterile technique and under real time ultrasound guidance: Using an 18-gauge needle I aspirated approximately 30 mL of clear, straw-colored  fluid, syringe switched and 30 mg/2 mL of OrthoVisc (sodium hyaluronate) in a prefilled syringe was injected easily into the knee. Completed without difficulty  Pain immediately resolved suggesting accurate placement of the medication.  Advised to call if fevers/chills, erythema, induration, drainage, or persistent bleeding.  Images permanently stored and available for review in the ultrasound unit.  Impression: Technically successful ultrasound guided injection.  Impression and Recommendations:    Primary osteoarthritis of both knees Excellent initial but short-lived response to steroid injections at the last visit in April. Proceeding to start Visco supplementation, Orthovisc No. 1 of 4 into both knees with an aspiration. Return to see me for Orthovisc No. 2 of 4 into both knees in 1 week. ___________________________________________ Ihor Austinhomas J. Benjamin Stainhekkekandam, M.D., ABFM., CAQSM. Primary Care and Sports Medicine [facility]  Adjunct  Instructor of Family Medicine  [employer]

## 2018-05-21 NOTE — Assessment & Plan Note (Signed)
Excellent initial but short-lived response to steroid injections at the last visit in April. Proceeding to start Visco supplementation, Orthovisc No. 1 of 4 into both knees with an aspiration. Return to see me for Orthovisc No. 2 of 4 into both knees in 1 week.

## 2018-05-27 ENCOUNTER — Other Ambulatory Visit (INDEPENDENT_AMBULATORY_CARE_PROVIDER_SITE_OTHER): Payer: Medicare Other

## 2018-05-27 ENCOUNTER — Other Ambulatory Visit: Payer: Self-pay

## 2018-05-27 DIAGNOSIS — Z79899 Other long term (current) drug therapy: Principal | ICD-10-CM

## 2018-05-27 DIAGNOSIS — E538 Deficiency of other specified B group vitamins: Secondary | ICD-10-CM | POA: Diagnosis not present

## 2018-05-27 DIAGNOSIS — Z5181 Encounter for therapeutic drug level monitoring: Secondary | ICD-10-CM | POA: Diagnosis not present

## 2018-05-28 ENCOUNTER — Ambulatory Visit (INDEPENDENT_AMBULATORY_CARE_PROVIDER_SITE_OTHER): Payer: Medicare Other | Admitting: Sports Medicine

## 2018-05-28 DIAGNOSIS — M170 Bilateral primary osteoarthritis of knee: Secondary | ICD-10-CM

## 2018-05-28 LAB — ALT: ALT: 26 IU/L (ref 0–32)

## 2018-05-28 NOTE — Assessment & Plan Note (Signed)
Orthovisc injection #2 of 4 to both knees, return in 1 week for #3 of 4.

## 2018-05-28 NOTE — Progress Notes (Signed)
   Procedure: Real-time Ultrasound Guided Injection of left knee Device: GE Logiq E  Verbal informed consent obtained.  Time-out conducted.  Noted no overlying erythema, induration, or other signs of local infection.  Skin prepped in a sterile fashion.  Local anesthesia: Topical Ethyl chloride.  With sterile technique and under real time ultrasound guidance: 22-gauge needle advanced into the suprapatellar recess, aspirated 45 cc of straw-colored fluid, syringe switched and 30 mg/2 mL of OrthoVisc (sodium hyaluronate) in a prefilled syringe was injected easily into the knee through a 22-gauge needle. Completed without difficulty  Pain immediately resolved suggesting accurate placement of the medication.  Advised to call if fevers/chills, erythema, induration, drainage, or persistent bleeding.  Images permanently stored and available for review in the ultrasound unit.  Impression: Technically successful ultrasound guided injection.  Procedure: Real-time Ultrasound Guided Injection of right knee Device: GE Logiq E  Verbal informed consent obtained.  Time-out conducted.  Noted no overlying erythema, induration, or other signs of local infection.  Skin prepped in a sterile fashion.  Local anesthesia: Topical Ethyl chloride.  With sterile technique and under real time ultrasound guidance: Using an 18-gauge needle advanced into the suprapatellar recess, aspirated about 10 cc of straw-colored fluid, syringe switched and 30 mg/2 mL of OrthoVisc (sodium hyaluronate) in a prefilled syringe was injected easily into the knee. Completed without difficulty  Pain immediately resolved suggesting accurate placement of the medication.  Advised to call if fevers/chills, erythema, induration, drainage, or persistent bleeding.  Images permanently stored and available for review in the ultrasound unit.  Impression: Technically successful ultrasound guided injection.

## 2018-05-29 ENCOUNTER — Other Ambulatory Visit: Payer: Self-pay

## 2018-05-29 DIAGNOSIS — E538 Deficiency of other specified B group vitamins: Secondary | ICD-10-CM

## 2018-05-29 NOTE — Progress Notes (Signed)
Opened in error. T. Nelson, CMA 

## 2018-05-30 LAB — SPECIMEN STATUS REPORT

## 2018-05-30 LAB — VITAMIN B12: Vitamin B-12: 726 pg/mL (ref 232–1245)

## 2018-06-03 ENCOUNTER — Other Ambulatory Visit: Payer: Self-pay | Admitting: Family Medicine

## 2018-06-03 DIAGNOSIS — M1710 Unilateral primary osteoarthritis, unspecified knee: Secondary | ICD-10-CM

## 2018-06-03 DIAGNOSIS — M179 Osteoarthritis of knee, unspecified: Secondary | ICD-10-CM

## 2018-06-04 ENCOUNTER — Ambulatory Visit (INDEPENDENT_AMBULATORY_CARE_PROVIDER_SITE_OTHER): Payer: Medicare Other | Admitting: Sports Medicine

## 2018-06-04 ENCOUNTER — Encounter: Payer: Self-pay | Admitting: Sports Medicine

## 2018-06-04 DIAGNOSIS — M170 Bilateral primary osteoarthritis of knee: Secondary | ICD-10-CM | POA: Diagnosis not present

## 2018-06-04 NOTE — Assessment & Plan Note (Signed)
Orthovisc injection #3 of 4 into both knees, return in 1 week for #4 of 4. 

## 2018-06-04 NOTE — Progress Notes (Signed)

## 2018-06-11 ENCOUNTER — Ambulatory Visit (INDEPENDENT_AMBULATORY_CARE_PROVIDER_SITE_OTHER): Payer: Medicare Other | Admitting: Sports Medicine

## 2018-06-11 DIAGNOSIS — M170 Bilateral primary osteoarthritis of knee: Secondary | ICD-10-CM | POA: Diagnosis not present

## 2018-06-11 MED ORDER — TRAMADOL HCL 50 MG PO TABS: 50.0000 mg | ORAL_TABLET | Freq: Two times a day (BID) | ORAL | 3 refills | Status: DC

## 2018-06-11 NOTE — Progress Notes (Signed)

## 2018-06-11 NOTE — Assessment & Plan Note (Signed)
Orthovisc No. 4 of 4 into both knees, return as needed. 

## 2018-07-04 ENCOUNTER — Ambulatory Visit (INDEPENDENT_AMBULATORY_CARE_PROVIDER_SITE_OTHER): Payer: Medicare Other | Admitting: Family Medicine

## 2018-07-04 ENCOUNTER — Encounter: Payer: Self-pay | Admitting: Family Medicine

## 2018-07-04 VITALS — BP 130/84 | HR 65 | Ht 65.0 in | Wt 256.0 lb

## 2018-07-04 DIAGNOSIS — I10 Essential (primary) hypertension: Secondary | ICD-10-CM

## 2018-07-04 DIAGNOSIS — I872 Venous insufficiency (chronic) (peripheral): Secondary | ICD-10-CM

## 2018-07-04 DIAGNOSIS — E538 Deficiency of other specified B group vitamins: Secondary | ICD-10-CM | POA: Diagnosis not present

## 2018-07-04 DIAGNOSIS — E782 Mixed hyperlipidemia: Secondary | ICD-10-CM

## 2018-07-04 DIAGNOSIS — M179 Osteoarthritis of knee, unspecified: Secondary | ICD-10-CM

## 2018-07-04 DIAGNOSIS — M1710 Unilateral primary osteoarthritis, unspecified knee: Secondary | ICD-10-CM

## 2018-07-04 DIAGNOSIS — F1010 Alcohol abuse, uncomplicated: Secondary | ICD-10-CM

## 2018-07-04 MED ORDER — MELOXICAM 7.5 MG PO TABS: 15.0000 mg | ORAL_TABLET | Freq: Every day | ORAL | 1 refills | Status: DC

## 2018-07-04 MED ORDER — ATORVASTATIN CALCIUM 80 MG PO TABS: 80.0000 mg | ORAL_TABLET | Freq: Every day | ORAL | 3 refills | Status: DC

## 2018-07-04 NOTE — Progress Notes (Signed)
Impression and Recommendations:    1. Mixed hyperlipidemia   2. Essential hypertension   3. B12 deficiency   4. Morbid obesity ([place])   5. Chronic venous stasis dermatitis of both lower extremities   6. Alcohol abuse-with elevated ALT and AST   7. Osteoarthritis of knee, unspecified laterality, unspecified osteoarthritis type     1. H/o Alcohol abuse; with elevated ALT/AST - Liver enzymes normalized, WNL last check (as of June/July 2019). - Will continue to monitor. - Encouraged patient to continue to reduce alcoholic consumption, ideally down to 2 glasses per day.  2. Essential HTN - BP well controlled in office today.  - Pt asymptomatic and tolerating meds well without complication. Continue meds as listed below. - Encouraged patient to continue checking BP at home and keep a log.  Bring this into next visit.   3. Hypertriglyceridemia - Last Lipid Panel 03/11/2018. - Continue to reduce alcohol intake.  - Recheck FLP in near future.  4. Mixed Hyperlipidemia - Continue to take medications as prescribed nightly before bed.  See med list below.  Refill for Lipitor provided today.  - Prudent lifestyle habits and dietary changes such as low saturated & trans fat and low carb/ ketogenic diets discussed with patient.  Encouraged regular exercise and weight loss when appropriate.   The 10-year ASCVD risk score Mikey Bussing DC Brooke Bonito., et al., 2013) is: 14.2%   Values used to calculate the score:     Age: 72 years     Sex: Female     Is Non-Hispanic African American: No     Diabetic: No     Tobacco smoker: No     Systolic Blood Pressure: 599 mmHg     Is BP treated: Yes     HDL Cholesterol: 64 mg/dL     Total Cholesterol: 209 mg/dL  5. H/o gastric bypass/Vit B12 deficiency - Much improved one month ago, 05/27/2018, 726. - Continue B12 injections monthly.  - Will re-check B12 levels prior to third injection in future.  6. Vitamin D deficiency - Continue supplementation as  prescribed.  - Re-check in near future.  7. Bilateral lower extremity edema - Continue to keep your skin clean and dry. Use bandages and wrap the area.  - Elevate your knee above your heart when sitting.  - Encouraged patient to hydrate adequately and educated patient at length about the importance of adequate hydration when managing most of her medical conditions.  8. LE Neuropathy - Patient tolerating meds well.  See med list today. - Encouraged patient to make sure to take her dose of Neurontin at night, especially if she continues to experience symptoms of restless legs.  9. OA Knees - Continue Mobic as prescribed by Dr. Dianah Field; refill provided today. - Continue to follow up with Dr. Dianah Field. - Ice and elevate your knees whenever possible.  - Reviewed potential physical ramifications and risks of chronic NSAID use with patient today.  Educated and counseled her on importance of using NSAID's sparingly as possible.  Patient understands risks and desires to continue using NSAID's in treatment plan.  10. Sleep Hygiene - Advised patient to avoid using benadryl as a sleep aid.  - Patient agrees to cut her benadryl use down to one tablet.   - Advised patient that she can up her melatonin dosage to 10 if needed.  - Patient has tried elavil in the past; per pt, her body adjusts to this and it loses effect.  Educated patient about  several other options for sleep aid.  11. BMI Counseling - Morbid obesity - Encouraged patient to continue losing weight.  - Encouraged patient to continue diet and exercise.  Explained to patient what BMI refers to, and what it means medically.    Told patient to think about it as a "medical risk stratification measurement" and how increasing BMI is associated with increasing risk/ or worsening state of various diseases such as hypertension, hyperlipidemia, diabetes, premature OA, depression etc.  American Heart Association guidelines for healthy  diet, basically Mediterranean diet, and exercise guidelines of 30 minutes 5 days per week or more discussed in detail.  Health counseling performed.  All questions answered.  12. Lifestyle & Preventative Health Maintenance - Advised patient to continue working toward exercising to improve overall mental, physical, and emotional health.    - Encouraged patient to engage in daily physical activity, especially a formal exercise routine.  Recommended that the patient eventually strive for at least 150 minutes of moderate cardiovascular activity per week according to guidelines established by the [facility].   - Healthy dietary habits encouraged, including low-carb, and high amounts of lean protein in diet.   - Patient should also consume adequate amounts of water - half of body weight in oz of water per day.   Education and routine counseling performed. Handouts provided.  54. Follow-Up - Return for regularly scheduled chronic follow-up. - Patient knows she may also return for acute concerns PRN.    Meds ordered this encounter  Medications  . meloxicam (MOBIC) 7.5 MG tablet    Sig: Take 2 tablets (15 mg total) by mouth daily. AS NEEDED FOR ARTHRITIS PAIN    Dispense:  90 tablet    Refill:  1  . atorvastatin (LIPITOR) 80 MG tablet    Sig: Take 1 tablet (80 mg total) by mouth at bedtime.    Dispense:  90 tablet    Refill:  3    Medications Discontinued During This Encounter  Medication Reason  . clindamycin (CLEOCIN) 300 MG capsule Completed Course  . meloxicam (MOBIC) 7.5 MG tablet Reorder  . atorvastatin (LIPITOR) 80 MG tablet Reorder     The patient was counseled, risk factors were discussed, anticipatory guidance given.  Gross side effects, risk and benefits, and alternatives of medications discussed with patient.  Patient is aware that all medications have potential side effects and we are unable to predict every side effect or drug-drug interaction that may occur.  Expresses  verbal understanding and consents to current therapy plan and treatment regimen.  Return for 24mo BP, HLD, etc.  Please see AVS handed out to patient at the end of our visit for further patient instructions/ counseling done pertaining to today's office visit.    Note:  This document was prepared using Dragon voice recognition software and may include unintentional dictation errors.    This document serves as a record of services personally performed by DMellody Dance DO. It was created on her behalf by KToni Amend a trained medical scribe. The creation of this record is based on the scribe's personal observations and the provider's statements to them.   I have reviewed the above medical documentation for accuracy and completeness and I concur.  DMellody Dance09/22/19 5:07 PM    Subjective:    HPI: [name] RIESENis a 72y.o. female [facility]    Notes if she's having a bad day  for walking, she makes sure to keep her cane with her.  H/o Gastric Bypass Notes that she is keeping up with her supplements.  She is trying to keep up with her diet, but notes that it's hard for her to lose weight if she isn't "being hard on herself."  B12 Patient continues on B12 injections monthly, every 30 days.  Primary Osteoarthritis of the Knees She has finished injections of synthetic synovial fluid in her knees with Dr. Dianah Field.  Notes that it's really helped; "I can get in and out of the car more easily."  She is on a regime of taking 15 meloxicam every day for anti-inflammatory.  She was told she can take tylenol and tramadol daily; most days she takes only one tramadol.  Patient feels that her knees are doing very well, and that she's able to walk around more and get around better.  Alcohol Use Close to the same, but making small changes. Notes she's not having wine with dinner, unless it's "an extra  good dinner."  Then, after dinner, instead of an after dinner drink, they've started having a cookie and a glass of milk.  Mood Notes that her mood is doing very good.  States she has a schedule where she watches too much television, and confirms her husband is also worried about her television intake.  They are getting ready for their granddaughter's wedding.  She is making the corsages and is under a little pressure to get things done.  However, she is largely enjoying being involved with the wedding planning process, including plans to help with catering and wedding gowns, and describes this at length during the appointment.  Feels hopeful, has been busy, looking forward to the future.  Notes that she's feeling fairly energetic.  Sleep She takes melatonin and benadryl to help her sleep. Patient agrees to cut her benadryl down to one.  Notes that her legs start to hurt if she lays down too long.  She gets up and walks around, and this causes the pain to alleviate.  In the past, she tried elavil; notes that it works at first, but then her body "completely adjusts to it."  Patient typically dozes from 2:30 AM to 6:00 AM, she dozes, but then sleeps until about 10:00 AM.  Patient largely feels rested during the day; denies daytime sleepiness or somnolence.  Neuropathy Patient denies issues, S-E, or drowsiness on her Neurontin dose.  HTN:  -  Her blood pressure has been controlled at home.  Notes she's not checking it regularly.  She has a friend check it every so often; runs about the same as it runs here at the clinic.  - Patient reports good compliance with blood pressure medications  - Denies medication S-E   - Smoking Status noted   - She denies new onset of: chest pain, exercise intolerance, shortness of breath, dizziness, visual changes, headache, lower extremity swelling or claudication.    Last 3 blood pressure readings in our office are as follows: BP Readings from Last  3 Encounters:  07/04/18 130/84  06/11/18 136/76  06/04/18 129/74    Pulse Readings from Last 3 Encounters:  07/04/18 65  06/04/18 74  05/21/18 76    Filed Weights   07/04/18 1356  Weight: 256 lb (116.1 kg)    1. 72 y.o. female here for cholesterol follow-up.   - Patient reports good compliance with medications or treatment plan  - Denies medication S-E   - Smoking Status noted   -  She denies new onset of: chest pain, exercise intolerance, shortness of breath, dizziness, visual changes, headache, lower extremity swelling or claudication.   Denies myalgias.  The cholesterol last visit was:  Lab Results  Component Value Date   CHOL 209 (H) 03/11/2018   HDL 64 03/11/2018   LDLCALC 117 (H) 03/11/2018   TRIG 140 03/11/2018   CHOLHDL 3.3 03/11/2018    Hepatic Function Latest Ref Rng & Units 05/27/2018 05/08/2018 03/11/2018  Total Protein 6.0 - 8.5 g/dL - - 7.0  Albumin 3.5 - 4.8 g/dL - - 4.3  AST 0 - 40 IU/L - - 23  ALT 0 - 32 IU/L '26 18 15  '$ Alk Phosphatase 39 - 117 IU/L - - 110  Total Bilirubin 0.0 - 1.2 mg/dL - - 0.6  Bilirubin, Direct <=0.2 mg/dL - - -     Patient Care Team    Relationship Specialty Notifications Start End  Mellody Dance, DO PCP - General Family Medicine  06/14/16   Silverio Decamp, MD Consulting Physician Sports Medicine  11/08/16    Comment: OA- knee injections; PT  Lauraine Rinne, MD Consulting Physician Neurology  09/25/17    Comment: neuropsych- seen him for pinched nerves in neck in past  Leonie Man, MD Consulting Physician Cardiology  12/03/17      Lab Results  Component Value Date   CREATININE 0.89 03/11/2018   BUN 17 03/11/2018   NA 132 (L) 03/11/2018   K 5.2 03/11/2018   CL 89 (L) 03/11/2018   CO2 27 03/11/2018    Lab Results  Component Value Date   CHOL 209 (H) 03/11/2018   CHOL 183 09/25/2017   CHOL 191 08/15/2016    Lab Results  Component Value Date   HDL 64 03/11/2018   HDL 56 09/25/2017   HDL 54  08/15/2016    Lab Results  Component Value Date   LDLCALC 117 (H) 03/11/2018   LDLCALC 101 (H) 09/25/2017   [place] 95 08/15/2016    Lab Results  Component Value Date   TRIG 140 03/11/2018   TRIG 131 09/25/2017   TRIG 210 (H) 08/15/2016    Lab Results  Component Value Date   CHOLHDL 3.3 03/11/2018   CHOLHDL 3.3 09/25/2017   CHOLHDL 3.5 08/15/2016    No results found for: LDLDIRECT ===================================================================   Patient Active Problem List   Diagnosis Date Noted  . Hypertriglyceridemia 09/29/2016    Priority: High  . HLD (hyperlipidemia) 09/29/2016    Priority: High  . Adjustment disorder with mixed anxiety and depressed mood 09/29/2016    Priority: High  . Morbid obesity ([place]) 06/18/2016    Priority: High  . R Congenital Kidney ABN: Ess only has one fxn kidney 06/18/2016    Priority: High  . Hypertension 04/25/2015    Priority: High  . Alcohol abuse-with elevated ALT and AST 04/25/2015    Priority: High  . H/O gastric bypass 06/18/2016    Priority: Medium  . Peripheral edema- b/l L Ext w chronic skin changes 06/18/2016    Priority: Medium  . GAD (generalized anxiety disorder) 06/14/2016    Priority: Medium  . UGIB (upper gastrointestinal bleed) 04/25/2015    Priority: Medium  . Acute ulcer at gastrojejunal region- post gastric bypass     Priority: Medium  . Vitamin D deficiency 09/29/2016    Priority: Low  . h/o Iron deficiency anemia 06/14/2016    Priority: Low  . Hypothyroidism 02/13/2016    Priority: Low  . Osteoarthritis  of knee 07/04/2018  . Chronic venous stasis dermatitis of both lower extremities 03/11/2018  . B12 deficiency 03/04/2018  . Cellulitis of right leg 12/18/2017  . Peripheral neuropathy due to edema b/l feet 12/03/2017  . Decreased pedal pulses 10/27/2017  . Diastolic dysfunction with chronic heart failure ([place]) 10/25/2017  . Aortic systolic murmur on examination 10/02/2017  . Preoperative  cardiovascular examination 10/02/2017  . Exertional dyspnea 09/25/2017  . Bilateral lower extremity edema 11/07/2016  . Primary osteoarthritis of both knees 11/02/2016  . History of hysterectomy 06/18/2016  . Insomnia due to alcohol excess and stress 06/18/2016  . Myalgia and myositis 06/14/2016  . Stress at home 02/13/2016  . Distal radius fracture, right 01/15/2013     Past Medical History:  Diagnosis Date  . Alcohol abuse 04/25/2015  . Allergy   . Anastomotic ulcer S/P gastric bypass 06/18/2016  . Arthritis   . Cataract   . Degenerative disc disease, cervical   . GAD (generalized anxiety disorder) 06/14/2016  . HTN (hypertension) 04/25/2015  . Hypertension   . Hypothyroidism   . 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 Substance abuse ([place])    Alcohol abuse 2016  . UGIB (upper gastrointestinal bleed) 04/25/2015     Past Surgical History:  Procedure Laterality Date  . CHOLECYSTECTOMY    . COSMETIC SURGERY    . DIAGNOSTIC LAPAROSCOPY    . ESOPHAGOGASTRODUODENOSCOPY N/A 04/25/2015   Procedure: ESOPHAGOGASTRODUODENOSCOPY (EGD);  Surgeon: Jerene Bears, MD;  Location[facility] ENDOSCOPY;  Service: Endoscopy;  Laterality: N/A;  . FRACTURE SURGERY    . GASTRIC BYPASS    . NM MYOVIEW LTD  10/10/2017   Normal LV size and function EF 72%.  Medium sized mild severity defect in the apical septal apical lateral and apical wall suggestive of breast attenuation.  LOW RISK.  No ischemia or infarction.    . OPEN REDUCTION INTERNAL FIXATION (ORIF) DISTAL RADIAL FRACTURE Right 01/15/2013   Procedure: OPEN REDUCTION INTERNAL FIXATION (ORIF) Right DISTAL RADIUS FRACTURE;  Surgeon: Marybelle Killings, MD;  Location: [place];  Service: Orthopedics;  Laterality: Right;  . TRANSTHORACIC ECHOCARDIOGRAM  10/04/2017   Mild LVH.  EF 60-65%.  Grade 2/moderate diastolic dysfunction.  Mild pulmonary hypertension.  No valve lesions     Family History  Problem Relation Age  of Onset  . Heart disease Mother   . Stroke Mother 29  . Cancer Mother        skin and breast  . Hypertension Mother   . Breast cancer Mother 63  . COPD Father   . Cancer Father   . Diabetes Father   . Hypertension Father   . Stroke Maternal Grandmother   . Mental illness Maternal Grandmother   . Breast cancer Sister   . Cancer Cousin 50     Social History   Substance and Sexual Activity  Drug Use No  ,  Social History   Substance and Sexual Activity  Alcohol Use Yes  . Alcohol/week: 28.0 standard drinks  . Types: 28 Glasses of wine per week   Comment: drinks daily and has a bottle of wine per day  ,  Social History   Tobacco Use  Smoking Status Never Smoker  Smokeless Tobacco Never Used  ,    Current Outpatient Medications on File Prior to Visit  Medication Sig Dispense Refill  . acetaminophen (TYLENOL) 325 MG tablet Take 650 mg by mouth every 6 (six) hours  as needed.    . carvedilol (COREG) 6.25 MG tablet Take 1 tablet (6.25 mg total) by mouth 2 (two) times daily with a meal. 180 tablet 3  . Cholecalciferol (VITAMIN D3) 5000 units TABS 5,000 IU OTC vitamin D3 daily. (Patient taking differently: Take 15,000 Units by mouth daily. 5,000 IU OTC vitamin D3 daily.) 90 tablet 12  . cyanocobalamin (,VITAMIN B-12,) 1000 MCG/ML injection Inject 1 mL (1,000 mcg total) into the muscle every 30 (thirty) days. Please dispense 1 mL vials 6 mL 1  . diphenhydrAMINE (BENADRYL) 25 MG tablet Take 25 mg by mouth daily. Patient takes 1-2 nightly    . ferrous sulfate 325 (65 FE) MG tablet Take 325 mg by mouth daily with breakfast.    . furosemide (LASIX) 40 MG tablet Take 0.5 tablets (20 mg total) by mouth daily. 90 tablet 3  . ibuprofen (ADVIL,MOTRIN) 200 MG tablet Take 800 mg by mouth 2 (two) times daily as needed.    . magnesium 30 MG tablet Take 30 mg by mouth 2 (two) times a week. Patient takes prn and takes 1 gram    . Melatonin 5 MG CAPS Take 10 mg by mouth at bedtime.     .  Multiple Vitamin (MULTIVITAMIN) tablet Take 1 tablet by mouth daily.    . mupirocin ointment (BACTROBAN) 2 % Apply to affected wounds twice daily. 60 g 0  . pantoprazole (PROTONIX) 40 MG tablet Take 1 tablet (40 mg total) by mouth daily. 90 tablet 1  . traMADol (ULTRAM) 50 MG tablet Take 1 tablet (50 mg total) by mouth 2 (two) times daily. Maximum 6 tabs per day. 60 tablet 3   No current facility-administered medications on file prior to visit.      Allergies  Allergen Reactions  . Cocoa Anaphylaxis  . Red Dye Hives    Rash and Nausea diarrhea  . Lyrica [Pregabalin] Other (See Comments)    Abnormal bleeding  . Sulfa Antibiotics Rash     Review of Systems:   General:  Denies fever, chills Optho/Auditory:   Denies visual changes, blurred vision Respiratory:   Denies SOB, cough, wheeze, DIB  Cardiovascular:   Denies chest pain, palpitations, painful respirations Gastrointestinal:   Denies nausea, vomiting, diarrhea.  Endocrine:     Denies new hot or cold intolerance Musculoskeletal:  Denies joint swelling, gait issues, or new unexplained myalgias/ arthralgias Skin:  Denies rash, suspicious lesions  Neurological:    Denies dizziness, unexplained weakness, numbness  Psychiatric/Behavioral:   Denies mood changes  Objective:    Blood pressure 130/84, pulse 65, height '5\' 5"'$  (1.651 m), weight 256 lb (116.1 kg), SpO2 98 %.  Body mass index is 42.6 kg/m.  General: Well Developed, well nourished, and in no acute distress.  HEENT: Normocephalic, atraumatic, pupils equal round reactive to light, neck supple, No carotid bruits, no JVD Skin: Warm and dry, cap RF less 2 sec Cardiac: Regular rate and rhythm, S1, S2 WNL's, no murmurs rubs or gallops Respiratory: ECTA B/L, Not using accessory muscles, speaking in full sentences. NeuroM-Sk: Ambulates w/o assistance, moves ext * 4 w/o difficulty, sensation grossly intact.  Ext:  Chronic venostasis changes, unchanged from prior.  Edema b/l  lower ext, unchanged from prior. Psych: No HI/SI, judgement and insight good, Euthymic mood. Full Affect.

## 2018-07-04 NOTE — Patient Instructions (Signed)

## 2018-07-09 ENCOUNTER — Encounter (HOSPITAL_BASED_OUTPATIENT_CLINIC_OR_DEPARTMENT_OTHER): Payer: Medicare Other | Attending: Internal Medicine

## 2018-07-09 DIAGNOSIS — Z9884 Bariatric surgery status: Secondary | ICD-10-CM | POA: Diagnosis not present

## 2018-07-09 DIAGNOSIS — I872 Venous insufficiency (chronic) (peripheral): Secondary | ICD-10-CM | POA: Diagnosis not present

## 2018-07-09 DIAGNOSIS — Z6841 Body Mass Index (BMI) 40.0 and over, adult: Secondary | ICD-10-CM | POA: Insufficient documentation

## 2018-07-09 DIAGNOSIS — R609 Edema, unspecified: Secondary | ICD-10-CM | POA: Diagnosis not present

## 2018-07-09 DIAGNOSIS — I509 Heart failure, unspecified: Secondary | ICD-10-CM | POA: Diagnosis not present

## 2018-07-09 DIAGNOSIS — L97812 Non-pressure chronic ulcer of other part of right lower leg with fat layer exposed: Secondary | ICD-10-CM | POA: Insufficient documentation

## 2018-07-09 DIAGNOSIS — L97822 Non-pressure chronic ulcer of other part of left lower leg with fat layer exposed: Secondary | ICD-10-CM | POA: Diagnosis not present

## 2018-07-09 DIAGNOSIS — I110 Hypertensive heart disease with heart failure: Secondary | ICD-10-CM | POA: Insufficient documentation

## 2018-07-11 ENCOUNTER — Other Ambulatory Visit: Payer: Self-pay | Admitting: Family Medicine

## 2018-07-11 DIAGNOSIS — E039 Hypothyroidism, unspecified: Secondary | ICD-10-CM

## 2018-07-12 DIAGNOSIS — L97822 Non-pressure chronic ulcer of other part of left lower leg with fat layer exposed: Secondary | ICD-10-CM | POA: Diagnosis not present

## 2018-07-12 DIAGNOSIS — Z6841 Body Mass Index (BMI) 40.0 and over, adult: Secondary | ICD-10-CM | POA: Diagnosis not present

## 2018-07-12 DIAGNOSIS — R609 Edema, unspecified: Secondary | ICD-10-CM | POA: Diagnosis not present

## 2018-07-12 DIAGNOSIS — I872 Venous insufficiency (chronic) (peripheral): Secondary | ICD-10-CM | POA: Diagnosis not present

## 2018-07-12 DIAGNOSIS — L97812 Non-pressure chronic ulcer of other part of right lower leg with fat layer exposed: Secondary | ICD-10-CM | POA: Diagnosis not present

## 2018-07-17 DIAGNOSIS — I872 Venous insufficiency (chronic) (peripheral): Secondary | ICD-10-CM | POA: Diagnosis not present

## 2018-07-17 DIAGNOSIS — Z6841 Body Mass Index (BMI) 40.0 and over, adult: Secondary | ICD-10-CM | POA: Diagnosis not present

## 2018-07-17 DIAGNOSIS — L97819 Non-pressure chronic ulcer of other part of right lower leg with unspecified severity: Secondary | ICD-10-CM | POA: Diagnosis not present

## 2018-07-17 DIAGNOSIS — L97812 Non-pressure chronic ulcer of other part of right lower leg with fat layer exposed: Secondary | ICD-10-CM | POA: Diagnosis not present

## 2018-07-17 DIAGNOSIS — L97829 Non-pressure chronic ulcer of other part of left lower leg with unspecified severity: Secondary | ICD-10-CM | POA: Diagnosis not present

## 2018-07-17 DIAGNOSIS — R609 Edema, unspecified: Secondary | ICD-10-CM | POA: Diagnosis not present

## 2018-07-17 DIAGNOSIS — L97822 Non-pressure chronic ulcer of other part of left lower leg with fat layer exposed: Secondary | ICD-10-CM | POA: Diagnosis not present

## 2018-07-24 DIAGNOSIS — L97812 Non-pressure chronic ulcer of other part of right lower leg with fat layer exposed: Secondary | ICD-10-CM | POA: Diagnosis not present

## 2018-07-24 DIAGNOSIS — I872 Venous insufficiency (chronic) (peripheral): Secondary | ICD-10-CM | POA: Diagnosis not present

## 2018-07-24 DIAGNOSIS — L97822 Non-pressure chronic ulcer of other part of left lower leg with fat layer exposed: Secondary | ICD-10-CM | POA: Diagnosis not present

## 2018-07-24 DIAGNOSIS — R609 Edema, unspecified: Secondary | ICD-10-CM | POA: Diagnosis not present

## 2018-07-24 DIAGNOSIS — Z6841 Body Mass Index (BMI) 40.0 and over, adult: Secondary | ICD-10-CM | POA: Diagnosis not present

## 2018-07-24 DIAGNOSIS — L97829 Non-pressure chronic ulcer of other part of left lower leg with unspecified severity: Secondary | ICD-10-CM | POA: Diagnosis not present

## 2018-07-30 ENCOUNTER — Other Ambulatory Visit: Payer: Self-pay

## 2018-07-30 ENCOUNTER — Other Ambulatory Visit: Payer: Medicare Other

## 2018-07-30 DIAGNOSIS — E538 Deficiency of other specified B group vitamins: Secondary | ICD-10-CM

## 2018-07-30 NOTE — Progress Notes (Signed)
b12

## 2018-07-31 LAB — VITAMIN B12: Vitamin B-12: 396 pg/mL (ref 232–1245)

## 2018-08-05 ENCOUNTER — Other Ambulatory Visit: Payer: Self-pay | Admitting: Family Medicine

## 2018-08-05 DIAGNOSIS — G629 Polyneuropathy, unspecified: Secondary | ICD-10-CM

## 2018-08-05 DIAGNOSIS — M792 Neuralgia and neuritis, unspecified: Principal | ICD-10-CM

## 2018-08-05 DIAGNOSIS — I10 Essential (primary) hypertension: Secondary | ICD-10-CM

## 2018-09-09 ENCOUNTER — Encounter: Payer: Self-pay | Admitting: Family Medicine

## 2018-09-09 ENCOUNTER — Ambulatory Visit (INDEPENDENT_AMBULATORY_CARE_PROVIDER_SITE_OTHER): Payer: Medicare Other | Admitting: Family Medicine

## 2018-09-09 VITALS — BP 106/73 | HR 63 | Ht 65.0 in | Wt 249.0 lb

## 2018-09-09 DIAGNOSIS — I10 Essential (primary) hypertension: Secondary | ICD-10-CM | POA: Diagnosis not present

## 2018-09-09 DIAGNOSIS — I5032 Chronic diastolic (congestive) heart failure: Secondary | ICD-10-CM

## 2018-09-09 DIAGNOSIS — F1010 Alcohol abuse, uncomplicated: Secondary | ICD-10-CM | POA: Diagnosis not present

## 2018-09-09 DIAGNOSIS — E538 Deficiency of other specified B group vitamins: Secondary | ICD-10-CM

## 2018-09-09 DIAGNOSIS — E559 Vitamin D deficiency, unspecified: Secondary | ICD-10-CM

## 2018-09-09 DIAGNOSIS — E039 Hypothyroidism, unspecified: Secondary | ICD-10-CM

## 2018-09-09 DIAGNOSIS — R531 Weakness: Secondary | ICD-10-CM | POA: Diagnosis not present

## 2018-09-09 DIAGNOSIS — S37001D Unspecified injury of right kidney, subsequent encounter: Secondary | ICD-10-CM

## 2018-09-09 DIAGNOSIS — F10982 Alcohol use, unspecified with alcohol-induced sleep disorder: Secondary | ICD-10-CM

## 2018-09-09 NOTE — Progress Notes (Signed)
Pt here for an acute care OV today   Impression and Recommendations:    1. Weakness generalized   2. Alcohol abuse-with elevated ALT and AST   3. Essential hypertension   4. Morbid obesity (HCC)   5. Injury of right kidney, subsequent encounter   6. Hypothyroidism, unspecified type   7. Vitamin D deficiency   8. B12 deficiency   9. Diastolic dysfunction with chronic heart failure (HCC)   10. Insomnia due to alcohol excess and stress     Acute -Recommended at home help to begin strengthening exercises and increasing activity; pt refused -Explained to pt that difficulty moving can be due to electrolyte imbalance, anemia, alcohol withdrawal, sedentary lifestyle etc -Also we discussed how now that she does not drink as much wine, she is probably not drinking anywhere near as much as usual and actual liquids. -Recommended pt to increase their water intake around 75 ounces of water per day; working her way up to one half of her weight in ounces of water per day -Encouraged pt to begin moving regularly and making an effort to become more active -Explained that with advanced age, sedentary lifestyle can quickly cause deficits in mobility/and deconditioning as we have mentioned many times in the past -Explained the need for laboratory testing to see if changes have occurred in the blood or electrolyte levels -Explained the possibility of a mini-stroke - but less likely due to recent d/c of ETOH, and then onset of sx after -Explained how sedentary lifestyle may not be the cause but is definitely playing a part in her lack of strength  HTN -Instructed pt to monitor her bp at home and contact us if bp readings are low -Explained how decreasing alcohol consumption can cause bp to drop -Discussed how excessive alcohol use can causes bp to increase    Education and routine counseling performed. Handouts provided   Expresses verbal understanding and consents to current therapy and  treatment regimen.  No barriers to understanding were identified.  Red flag symptoms and signs discussed in detail.  Patient expressed understanding regarding what to do in case of emergency\urgent symptoms   Orders Placed This Encounter  Procedures  . Magnesium  . Phosphorus  . T4, free  . TSH  . VITAMIN D 25 Hydroxy (Vit-D Deficiency, Fractures)  . Comprehensive metabolic panel  . Hemoglobin A1c  . Lipase  . Amylase  . CBC with Differential/Platelet  . Vitamin B12  . Folate  . Iron and TIBC  . Ferritin  . Vitamin B1  . Specimen status report    Please see AVS handed out to patient at the end of our visit for further patient instructions/ counseling done pertaining to today's office visit.   Return if symptoms worsen or fail to improve.     Note:  This document was prepared occasionally using Dragon voice recognition software and may include unintentional dictation errors in addition to a scribe.  This document serves as a record of services personally performed by Thomasene Lot, MD. It was created on her behalf by Alphonse Guild, a trained medical scribe. The creation of this record is based on the scribe's personal observations and the provider's statements to them.   I have reviewed the above medical documentation for accuracy and completeness and I concur.  Thomasene Lot 09/10/18 4:32 PM      --------------------------------------------------------------------------------------------------------------------------------------------------------------------------------------------------------------------------------------------    Subjective:    CC:  Chief Complaint  Patient presents with  . Fatigue  HPI: [name] is a 72 y.o. female who presents to [facility] today for issues as discussed below. -Pt is an Charity fundraiser  Weakness Onset: -States 10 days ago she tried to stand up and "nothing worked"  Sx -States she waited  for Jillyn Hidden to get home to help her get up  -States he also noticed she was having a lot of difficulty moving around -Says she has had problems standing and with overall weakness since but it hasn't been as bad -States that day she even had issues holding her head up and Jillyn Hidden said she was slurring her speech -Says she was noticing muscle jerks as well in her hands  -Has also been experiencing pain along the insides and outsides of her legs  -states she has noticed a little swelling -Says "the tops of her legs are much better but the sides are very tender" -Said she experienced the symptoms of weakness in both her upper and lower extremities on both sides -States she has been sleeping a lot more than usual as well especially in the evening or afternoon  -Denies one sided weakness, facial drooping  -Feels her strength and dexterity have improved over the past few days -Last night she finally went up the stairs for the first time since the event -Says her husband noticed she wasn't drinking enough water so she began drinking more water again a few days ago -States she has been getting around very slowly with a cane or furniture -Pt required assistance to stand from the chair and from the examination table  -Pt states she originally though it was a neurological issue but because it was bilateral and improved over time, she believed it wasn't a stroke  Alcohol abuse -states since that happened she has only been having 2 drinks per day because she wants to make sure the alcohol isn't causing the issues -States she didn't have anything to drink at her daughters wedding either -Says prior to the event she had been drinking her normal amounts but not when she was planning her daughters wedding -Says she cut back significantly since the day she experienced the weakness  Medical History -Pt is taking neurontin 3 tablets, TID; having difficulty swallowing them so she's been chewing them up -Pt states  she is still taking benadryl daily against Dr. Sharee Holster recommendation -says she has decided to only take tramadol intermittently and has decreased the B12  BP -States she has not been taking bp at home -States her old cuff has stopped working  -Pt states it is usually around 136/82-92  No problems updated.   Wt Readings from Last 3 Encounters:  09/09/18 249 lb (112.9 kg)  07/04/18 256 lb (116.1 kg)  06/04/18 258 lb (117 kg)   BP Readings from Last 3 Encounters:  09/09/18 106/73  07/04/18 130/84  06/11/18 136/76   BMI Readings from Last 3 Encounters:  09/09/18 41.44 kg/m  07/04/18 42.60 kg/m  06/04/18 42.93 kg/m     Patient Care Team    Relationship Specialty Notifications Start End  Thomasene Lot, DO PCP - General Family Medicine  06/14/16   Monica Becton, MD Consulting Physician Sports Medicine  11/08/16    Comment: OA- knee injections; PT  Jerolyn Shin, MD Consulting Physician Neurology  09/25/17    Comment: neuropsych- seen him for pinched nerves in neck in past  Marykay Lex, MD Consulting Physician Cardiology  12/03/17      Patient Active Problem  List   Diagnosis Date Noted  . Hypertriglyceridemia 09/29/2016    Priority: High  . HLD (hyperlipidemia) 09/29/2016    Priority: High  . Adjustment disorder with mixed anxiety and depressed mood 09/29/2016    Priority: High  . Morbid obesity (HCC) 06/18/2016    Priority: High  . R Congenital Kidney ABN: Ess only has one fxn kidney 06/18/2016    Priority: High  . Hypertension 04/25/2015    Priority: High  . Alcohol abuse-with elevated ALT and AST 04/25/2015    Priority: High  . H/O gastric bypass 06/18/2016    Priority: Medium  . Peripheral edema- b/l L Ext w chronic skin changes 06/18/2016    Priority: Medium  . GAD (generalized anxiety disorder) 06/14/2016    Priority: Medium  . UGIB (upper gastrointestinal bleed) 04/25/2015    Priority: Medium  . Acute ulcer at gastrojejunal region-  post gastric bypass     Priority: Medium  . Vitamin D deficiency 09/29/2016    Priority: Low  . h/o Iron deficiency anemia 06/14/2016    Priority: Low  . Hypothyroidism 02/13/2016    Priority: Low  . Weakness generalized 09/09/2018  . Osteoarthritis of knee 07/04/2018  . Chronic venous stasis dermatitis of both lower extremities 03/11/2018  . B12 deficiency 03/04/2018  . Cellulitis of right leg 12/18/2017  . Peripheral neuropathy due to edema b/l feet 12/03/2017  . Decreased pedal pulses 10/27/2017  . Diastolic dysfunction with chronic heart failure (HCC) 10/25/2017  . Aortic systolic murmur on examination 10/02/2017  . Preoperative cardiovascular examination 10/02/2017  . Exertional dyspnea 09/25/2017  . Bilateral lower extremity edema 11/07/2016  . Primary osteoarthritis of both knees 11/02/2016  . History of hysterectomy 06/18/2016  . Insomnia due to alcohol excess and stress 06/18/2016  . Myalgia and myositis 06/14/2016  . Stress at home 02/13/2016  . Distal radius fracture, right 01/15/2013      Past Medical History:  Diagnosis Date  . Alcohol abuse 04/25/2015  . Allergy   . Anastomotic ulcer S/P gastric bypass 06/18/2016  . Arthritis   . Cataract   . Degenerative disc disease, cervical   . GAD (generalized anxiety disorder) 06/14/2016  . HTN (hypertension) 04/25/2015  . Hypertension   . Hypothyroidism   . 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 Substance abuse (HCC)    Alcohol abuse 2016  . UGIB (upper gastrointestinal bleed) 04/25/2015     Past Surgical History:  Procedure Laterality Date  . CHOLECYSTECTOMY    . COSMETIC SURGERY    . DIAGNOSTIC LAPAROSCOPY    . ESOPHAGOGASTRODUODENOSCOPY N/A 04/25/2015   Procedure: ESOPHAGOGASTRODUODENOSCOPY (EGD);  Surgeon: Beverley Fiedler, MD;  Location: [facility] ENDOSCOPY;  Service: Endoscopy;  Laterality: N/A;  . FRACTURE SURGERY    . GASTRIC BYPASS    . NM MYOVIEW LTD  10/10/2017    Normal LV size and function EF 72%.  Medium sized mild severity defect in the apical septal apical lateral and apical wall suggestive of breast attenuation.  LOW RISK.  No ischemia or infarction.    . OPEN REDUCTION INTERNAL FIXATION (ORIF) DISTAL RADIAL FRACTURE Right 01/15/2013   Procedure: OPEN REDUCTION INTERNAL FIXATION (ORIF) Right DISTAL RADIUS FRACTURE;  Surgeon: Eldred Manges, MD;  Location: [facility] OR;  Service: Orthopedics;  Laterality: Right;  . TRANSTHORACIC ECHOCARDIOGRAM  10/04/2017   Mild LVH.  EF 60-65%.  Grade 2/moderate diastolic dysfunction.  Mild pulmonary hypertension.  No valve lesions  Family History  Problem Relation Age of Onset  . Heart disease Mother   . Stroke Mother 25  . Cancer Mother        skin and breast  . Hypertension Mother   . Breast cancer Mother 9  . COPD Father   . Cancer Father   . Diabetes Father   . Hypertension Father   . Stroke Maternal Grandmother   . Mental illness Maternal Grandmother   . Breast cancer Sister   . Cancer Cousin 68     Social History   Socioeconomic History  . Marital status: Married    Spouse name: Not on file  . Number of children: Not on file  . Years of education: Not on file  . Highest education level: Not on file  Occupational History  . Occupation: Charity fundraiser    Comment: ICU Sales executive  Social Needs  . Financial resource strain: Not on file  . Food insecurity:    Worry: Not on file    Inability: Not on file  . Transportation needs:    Medical: Not on file    Non-medical: Not on file  Tobacco Use  . Smoking status: Never Smoker  . Smokeless tobacco: Never Used  Substance and Sexual Activity  . Alcohol use: Yes    Alcohol/week: 28.0 standard drinks    Types: 28 Glasses of wine per week    Comment: drinks daily and has a bottle of wine per day  . Drug use: No  . Sexual activity: Yes    Birth control/protection: Post-menopausal  Lifestyle  . Physical activity:    Days per week: Not on file    Minutes  per session: Not on file  . Stress: Not on file  Relationships  . Social connections:    Talks on phone: Not on file    Gets together: Not on file    Attends religious service: Not on file    Active member of club or organization: Not on file    Attends meetings of clubs or organizations: Not on file    Relationship status: Not on file  . Intimate partner violence:    Fear of current or ex partner: Not on file    Emotionally abused: Not on file    Physically abused: Not on file    Forced sexual activity: Not on file  Other Topics Concern  . 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 acetaminophen (TYLENOL) 500 MG tablet Take 1,000 mg by mouth daily.   Marland Kitchen atorvastatin (LIPITOR) 80 MG tablet Take 1 tablet (80 mg total) by mouth at bedtime.  . benazepril-hydrochlorthiazide (LOTENSIN HCT) 20-25 MG tablet TAKE 1 TABLET BY MOUTH DAILY.  . carvedilol (COREG) 6.25 MG tablet Take 1 tablet (6.25 mg total) by mouth 2 (two) times daily with a meal.  . Cholecalciferol (VITAMIN D3) 5000 units TABS 5,000 IU OTC vitamin D3 daily. (Patient taking differently: Take 15,000 Units by mouth daily. 5,000 IU OTC vitamin D3 daily.)  . cyanocobalamin (,VITAMIN B-12,) 1000 MCG/ML injection Inject 1 mL (1,000 mcg total) into the muscle every 30 (thirty) days. Please dispense 1 mL vials  . diphenhydrAMINE (BENADRYL) 25 MG tablet Take 25  mg by mouth at bedtime as needed.   . ferrous sulfate 325 (65 FE) MG tablet Take 325 mg by mouth daily with breakfast.  . furosemide (LASIX) 40 MG tablet Take 0.5 tablets (20 mg total) by mouth daily.  Marland Kitchen gabapentin (NEURONTIN) 300 MG capsule Take 3 capsules (900 mg total) by mouth 3 (three) times daily.  Marland Kitchen levothyroxine (SYNTHROID, LEVOTHROID) 150 MCG tablet TAKE 1 TABLET BY MOUTH DAILY.    . magnesium 30 MG tablet Take 30 mg by mouth 2 (two) times a week. Patient takes prn and takes 1 gram  . Melatonin 5 MG CAPS Take 10 mg by mouth at bedtime.   . meloxicam (MOBIC) 15 MG tablet Take 15 mg by mouth daily.  . Multiple Vitamin (MULTIVITAMIN) tablet Take 1 tablet by mouth daily.  . mupirocin ointment (BACTROBAN) 2 % Apply to affected wounds twice daily.  . pantoprazole (PROTONIX) 40 MG tablet Take 1 tablet (40 mg total) by mouth daily.  . traMADol (ULTRAM) 50 MG tablet Take 1 tablet (50 mg total) by mouth 2 (two) times daily. Maximum 6 tabs per day.    Allergies:  Allergies  Allergen Reactions  . Cocoa Anaphylaxis  . Red Dye Hives    Rash and Nausea diarrhea  . Lyrica [Pregabalin] Other (See Comments)    Abnormal bleeding  . Sulfa Antibiotics Rash     Review of Systems: General:   Denies fever, chills, unexplained weight loss.  Optho/Auditory:   Denies visual changes, blurred vision/LOV Respiratory:   Denies wheeze, DOE more than baseline levels.   Cardiovascular:   Denies chest pain, palpitations, new onset peripheral edema  Gastrointestinal:   Denies nausea, vomiting, diarrhea, abd pain.  Genitourinary: Denies dysuria, freq/ urgency, flank pain or discharge from genitals.  Endocrine:     Denies hot or cold intolerance, polyuria, polydipsia. Musculoskeletal:   Denies unexplained myalgias, joint swelling, unexplained arthralgias, gait problems.  Skin:  Denies new onset rash, suspicious lesions Neurological:     Denies dizziness, unexplained weakness, numbness  Psychiatric/Behavioral:   Denies mood changes, suicidal or homicidal ideations, hallucinations    Objective:   Blood pressure 106/73, pulse 63, height 5\' 5"  (1.651 m), weight 249 lb (112.9 kg), SpO2 100 %. Body mass index is 41.44 kg/m. General:  Well Developed, well nourished, appropriate for stated age.  Neuro:  Alert and oriented,  extra-ocular muscles intact  HEENT:  Normocephalic, atraumatic, neck  supple Skin:  no gross rash, warm, pink. Cardiac:  RRR, S1 S2 Respiratory:  ECTA B/L and A/P, Not using accessory muscles, speaking in full sentences- unlabored. Vascular:  Ext warm, no cyanosis apprec.; cap RF less 2 sec. Psych:  No HI/SI, judgement and insight good, Euthymic mood. Full Affect.

## 2018-09-09 NOTE — Patient Instructions (Addendum)
Please do orthostatics and let me know results.  -Ms. Linda please increase your water intake per day.  This has drastically gone down since your wine intake has drastically gone down.   -Please let me know if you would like home health aide to come in and work with you on strengthening / mobility etc.  -It is extremely important as we have talked about many times in the past that you just do not sit around during the day and you are up exercising and moving.  For right now, it is important you use a walker and/or cane for stability at all times.  -Please let us know what your blood pressures are running at home and send [place] a message in a couple of weeks.  Checked daily at different times a day.   If they are running low, on a consistent basis, please call us and let us know as we can advise you what medication changes to make.  -Also if you have any changes in your symptoms, or they worsen, please let us know as a neurology evaluation might be prudent at that time

## 2018-09-10 LAB — LIPASE: Lipase: 24 U/L (ref 14–85)

## 2018-09-10 LAB — FERRITIN: Ferritin: 671 ng/mL — ABNORMAL HIGH (ref 15–150)

## 2018-09-10 LAB — IRON AND TIBC
Iron Saturation: 39 % (ref 15–55)
Iron: 93 ug/dL (ref 27–139)
Total Iron Binding Capacity: 240 ug/dL — ABNORMAL LOW (ref 250–450)
UIBC: 147 ug/dL (ref 118–369)

## 2018-09-10 LAB — VITAMIN D 25 HYDROXY (VIT D DEFICIENCY, FRACTURES): Vit D, 25-Hydroxy: 35.8 ng/mL (ref 30.0–100.0)

## 2018-09-10 LAB — COMPREHENSIVE METABOLIC PANEL
ALT: 73 IU/L — ABNORMAL HIGH (ref 0–32)
AST: 33 IU/L (ref 0–40)
Albumin/Globulin Ratio: 1.8 (ref 1.2–2.2)
Albumin: 3.9 g/dL (ref 3.5–4.8)
Alkaline Phosphatase: 121 IU/L — ABNORMAL HIGH (ref 39–117)
BUN/Creatinine Ratio: 30 — ABNORMAL HIGH (ref 12–28)
BUN: 60 mg/dL — ABNORMAL HIGH (ref 8–27)
Bilirubin Total: 0.5 mg/dL (ref 0.0–1.2)
CO2: 25 mmol/L (ref 20–29)
Calcium: 9.2 mg/dL (ref 8.7–10.3)
Chloride: 97 mmol/L (ref 96–106)
Creatinine, Ser: 2.02 mg/dL — ABNORMAL HIGH (ref 0.57–1.00)
GFR calc Af Amer: 28 mL/min/{1.73_m2} — ABNORMAL LOW (ref >59)
GFR calc non Af Amer: 24 mL/min/{1.73_m2} — ABNORMAL LOW (ref >59)
Globulin, Total: 2.2 g/dL (ref 1.5–4.5)
Glucose: 120 mg/dL — ABNORMAL HIGH (ref 65–99)
Potassium: 5.1 mmol/L (ref 3.5–5.2)
Sodium: 138 mmol/L (ref 134–144)
Total Protein: 6.1 g/dL (ref 6.0–8.5)

## 2018-09-10 LAB — CBC WITH DIFFERENTIAL/PLATELET
Basophils Absolute: 0.1 10*3/uL (ref 0.0–0.2)
Basos: 2 %
EOS (ABSOLUTE): 0.3 10*3/uL (ref 0.0–0.4)
Eos: 4 %
Hematocrit: 35.8 % (ref 34.0–46.6)
Hemoglobin: 12.3 g/dL (ref 11.1–15.9)
Immature Grans (Abs): 0 10*3/uL (ref 0.0–0.1)
Immature Granulocytes: 1 %
Lymphocytes Absolute: 2.4 10*3/uL (ref 0.7–3.1)
Lymphs: 37 %
MCH: 36.5 pg — ABNORMAL HIGH (ref 26.6–33.0)
MCHC: 34.4 g/dL (ref 31.5–35.7)
MCV: 106 fL — ABNORMAL HIGH (ref 79–97)
Monocytes Absolute: 0.7 10*3/uL (ref 0.1–0.9)
Monocytes: 11 %
Neutrophils Absolute: 3.1 10*3/uL (ref 1.4–7.0)
Neutrophils: 45 %
Platelets: 229 10*3/uL (ref 150–450)
RBC: 3.37 x10E6/uL — ABNORMAL LOW (ref 3.77–5.28)
RDW: 12.2 % — ABNORMAL LOW (ref 12.3–15.4)
WBC: 6.6 10*3/uL (ref 3.4–10.8)

## 2018-09-10 LAB — HEMOGLOBIN A1C
Est. average glucose Bld gHb Est-mCnc: 111 mg/dL
Hgb A1c MFr Bld: 5.5 % (ref 4.8–5.6)

## 2018-09-10 LAB — AMYLASE: Amylase: 65 U/L (ref 31–124)

## 2018-09-10 LAB — T4, FREE: Free T4: 1.04 ng/dL (ref 0.82–1.77)

## 2018-09-10 LAB — MAGNESIUM: Magnesium: 3.4 mg/dL — ABNORMAL HIGH (ref 1.6–2.3)

## 2018-09-10 LAB — FOLATE: Folate: 8.2 ng/mL (ref >3.0)

## 2018-09-10 LAB — TSH: TSH: 1.33 u[IU]/mL (ref 0.450–4.500)

## 2018-09-10 LAB — PHOSPHORUS: Phosphorus: 4.1 mg/dL (ref 2.5–4.5)

## 2018-09-10 LAB — SPECIMEN STATUS REPORT

## 2018-09-10 LAB — VITAMIN B12: Vitamin B-12: >2000 pg/mL — ABNORMAL HIGH (ref 232–1245)

## 2018-09-10 LAB — VITAMIN B1

## 2018-09-16 ENCOUNTER — Other Ambulatory Visit: Payer: Self-pay

## 2018-09-16 DIAGNOSIS — G8929 Other chronic pain: Secondary | ICD-10-CM

## 2018-09-16 DIAGNOSIS — M549 Dorsalgia, unspecified: Secondary | ICD-10-CM

## 2018-09-16 DIAGNOSIS — R748 Abnormal levels of other serum enzymes: Secondary | ICD-10-CM

## 2018-09-16 DIAGNOSIS — N179 Acute kidney failure, unspecified: Secondary | ICD-10-CM

## 2018-09-16 DIAGNOSIS — F1010 Alcohol abuse, uncomplicated: Secondary | ICD-10-CM

## 2018-09-16 DIAGNOSIS — E538 Deficiency of other specified B group vitamins: Secondary | ICD-10-CM

## 2018-09-16 MED ORDER — CYCLOBENZAPRINE HCL 10 MG PO TABS: 10.0000 mg | ORAL_TABLET | Freq: Three times a day (TID) | ORAL | 0 refills | Status: DC | PRN

## 2018-09-16 NOTE — Progress Notes (Signed)
Orders placed for ultrasound, future labs, and flexeril RX per. Dr. Sharee Holster. MPulliam, CMA/RT(R)

## 2018-09-17 ENCOUNTER — Telehealth: Payer: Self-pay | Admitting: Family Medicine

## 2018-09-17 NOTE — Telephone Encounter (Signed)
Arena @ [facility] [facility] [phone] request  Provider/ nurse call to clarify [facility] order.  --forwarding message to medical assistant.  --Fausto Skillern

## 2018-09-18 NOTE — Telephone Encounter (Signed)
Called left message to call back. MPulliam, CMA/RT(R)  

## 2018-09-20 ENCOUNTER — Ambulatory Visit
Admission: RE | Admit: 2018-09-20 | Discharge: 2018-09-20 | Disposition: A | Discharge status: Home / Self Care | Payer: Medicare Other | Source: Ambulatory Visit | Attending: Family Medicine | Admitting: Family Medicine

## 2018-09-20 ENCOUNTER — Other Ambulatory Visit: Payer: Medicare Other

## 2018-09-20 DIAGNOSIS — E538 Deficiency of other specified B group vitamins: Secondary | ICD-10-CM

## 2018-09-20 DIAGNOSIS — N179 Acute kidney failure, unspecified: Secondary | ICD-10-CM

## 2018-09-20 DIAGNOSIS — F1010 Alcohol abuse, uncomplicated: Secondary | ICD-10-CM

## 2018-09-20 DIAGNOSIS — R748 Abnormal levels of other serum enzymes: Secondary | ICD-10-CM

## 2018-09-20 NOTE — Telephone Encounter (Signed)
Called and left message to call the office. MPulliam, CMA/RT(R)  

## 2018-09-23 DIAGNOSIS — H2513 Age-related nuclear cataract, bilateral: Secondary | ICD-10-CM | POA: Diagnosis not present

## 2018-09-23 DIAGNOSIS — H40033 Anatomical narrow angle, bilateral: Secondary | ICD-10-CM | POA: Diagnosis not present

## 2018-09-25 ENCOUNTER — Ambulatory Visit (INDEPENDENT_AMBULATORY_CARE_PROVIDER_SITE_OTHER): Payer: Medicare Other | Admitting: Sports Medicine

## 2018-09-25 ENCOUNTER — Encounter: Payer: Self-pay | Admitting: Sports Medicine

## 2018-09-25 ENCOUNTER — Other Ambulatory Visit: Payer: Medicare Other

## 2018-09-25 DIAGNOSIS — M170 Bilateral primary osteoarthritis of knee: Secondary | ICD-10-CM

## 2018-09-25 DIAGNOSIS — F1010 Alcohol abuse, uncomplicated: Secondary | ICD-10-CM

## 2018-09-25 DIAGNOSIS — R748 Abnormal levels of other serum enzymes: Secondary | ICD-10-CM

## 2018-09-25 DIAGNOSIS — E538 Deficiency of other specified B group vitamins: Secondary | ICD-10-CM

## 2018-09-25 DIAGNOSIS — N179 Acute kidney failure, unspecified: Secondary | ICD-10-CM

## 2018-09-25 MED ORDER — LIRAGLUTIDE -WEIGHT MANAGEMENT 18 MG/3ML ~~LOC~~ SOPN: 3.0000 mg | PEN_INJECTOR | Freq: Every day | SUBCUTANEOUS | 0 refills | Status: DC

## 2018-09-25 NOTE — Assessment & Plan Note (Signed)
We finished a full series of Orthovisc back in July of this year, she is completely pain-free. She has however noting increasing swelling and effusion. Arthrocentesis with crystal analysis today, we will also start her on Saxenda.

## 2018-09-25 NOTE — Progress Notes (Signed)
Subjective:    CC: Right knee swelling  HPI: This is a pleasant 72 year old female, known bilateral knee osteoarthritis, we finished a series of Orthovisc back in July of this year, she has done extremely well, and is completely pain-free with regards to her knees.  Unfortunately she has noted a recurrence of swelling in her right knee making it difficult to bend.  Swelling is moderate, persistent, localized without radiation, no trauma, no mechanical symptoms.  Obesity: Has had bariatric surgery, would like to try Saxenda, she does have hyperglycemia as well.  I reviewed the past medical history, family history, social history, surgical history, and allergies today and no changes were needed.  Please see the problem list section below in epic for further details.  Past Medical History: Past Medical History:  Diagnosis Date  . Alcohol abuse 04/25/2015  . Allergy   . Anastomotic ulcer S/P gastric bypass 06/18/2016  . Arthritis   . Cataract   . Degenerative disc disease, cervical   . GAD (generalized anxiety disorder) 06/14/2016  . HTN (hypertension) 04/25/2015  . Hypertension   . Hypothyroidism   . 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 Substance abuse (HCC)    Alcohol abuse 2016  . UGIB (upper gastrointestinal bleed) 04/25/2015   Past Surgical History: Past Surgical History:  Procedure Laterality Date  . CHOLECYSTECTOMY    . COSMETIC SURGERY    . DIAGNOSTIC LAPAROSCOPY    . ESOPHAGOGASTRODUODENOSCOPY N/A 04/25/2015   Procedure: ESOPHAGOGASTRODUODENOSCOPY (EGD);  Surgeon: Beverley Fiedler, MD;  Location: [facility] ENDOSCOPY;  Service: Endoscopy;  Laterality: N/A;  . FRACTURE SURGERY    . GASTRIC BYPASS    . NM MYOVIEW LTD  10/10/2017   Normal LV size and function EF 72%.  Medium sized mild severity defect in the apical septal apical lateral and apical wall suggestive of breast attenuation.  LOW RISK.  No ischemia or infarction.    . OPEN  REDUCTION INTERNAL FIXATION (ORIF) DISTAL RADIAL FRACTURE Right 01/15/2013   Procedure: OPEN REDUCTION INTERNAL FIXATION (ORIF) Right DISTAL RADIUS FRACTURE;  Surgeon: Eldred Manges, MD;  Location: [facility] OR;  Service: Orthopedics;  Laterality: Right;  . TRANSTHORACIC ECHOCARDIOGRAM  10/04/2017   Mild LVH.  EF 60-65%.  Grade 2/moderate diastolic dysfunction.  Mild pulmonary hypertension.  No valve lesions   Social History: Social History   Socioeconomic History  . Marital status: Married    Spouse name: Not on file  . Number of children: Not on file  . Years of education: Not on file  . Highest education level: Not on file  Occupational History  . Occupation: Charity fundraiser    Comment: ICU Sales executive  Social Needs  . Financial resource strain: Not on file  . Food insecurity:    Worry: Not on file    Inability: Not on file  . Transportation needs:    Medical: Not on file    Non-medical: Not on file  Tobacco Use  . Smoking status: Never Smoker  . Smokeless tobacco: Never Used  Substance and Sexual Activity  . Alcohol use: Yes    Alcohol/week: 28.0 standard drinks    Types: 28 Glasses of wine per week    Comment: drinks daily and has a bottle of wine per day  . Drug use: No  . Sexual activity: Yes    Birth control/protection: Post-menopausal  Lifestyle  . Physical activity:    Days per week: Not on file  Minutes per session: Not on file  . Stress: Not on file  Relationships  . Social connections:    Talks on phone: Not on file    Gets together: Not on file    Attends religious service: Not on file    Active member of club or organization: Not on file    Attends meetings of clubs or organizations: Not on file    Relationship status: Not on file  Other Topics Concern  . 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 Heart disease Mother   . Stroke Mother 51  . Cancer Mother        skin and breast  . Hypertension Mother   . Breast cancer Mother 76  . COPD Father   . Cancer Father   . Diabetes Father   . Hypertension Father   . Stroke Maternal Grandmother   . Mental illness Maternal Grandmother   . Breast cancer Sister   . Cancer Cousin 40   Allergies: Allergies  Allergen Reactions  . Cocoa Anaphylaxis  . Red Dye Hives    Rash and Nausea diarrhea  . Lyrica [Pregabalin] Other (See Comments)    Abnormal bleeding  . Sulfa Antibiotics Rash   Medications: See med rec.  Review of Systems: No fevers, chills, night sweats, weight loss, chest pain, or shortness of breath.   Objective:    General: Well Developed, well nourished, and in no acute distress.  Neuro: Alert and oriented x3, extra-ocular muscles intact, sensation grossly intact.  HEENT: Normocephalic, atraumatic, pupils equal round reactive to light, neck supple, no masses, no lymphadenopathy, thyroid nonpalpable.  Skin: Warm and dry, no rashes. Cardiac: Regular rate and rhythm, no murmurs rubs or gallops, no lower extremity edema.  Respiratory: Clear to auscultation bilaterally. Not using accessory muscles, speaking in full sentences. Right knee: Visibly swollen, palpable fluid wave and effusion.   ROM normal in flexion and extension and lower leg rotation. Ligaments with solid consistent endpoints including ACL, PCL, LCL, MCL. Negative Mcmurray's and provocative meniscal tests. Non painful patellar compression. Patellar and quadriceps tendons unremarkable. Hamstring and quadriceps strength is normal.  Procedure: Real-time Ultrasound Guided aspiration of right knee Device: GE Logiq E  Verbal informed consent obtained.  Time-out conducted.  Noted no overlying erythema, induration, or other signs of local infection.  Skin prepped in a sterile fashion.  Local anesthesia:  Topical Ethyl chloride.  With sterile technique and under real time ultrasound guidance: Using an 18-gauge needle I aspirated 15 cc of slightly cloudy, straw-colored fluid. Completed without difficulty  Pain immediately resolved suggesting accurate placement of the medication.  Advised to call if fevers/chills, erythema, induration, drainage, or persistent bleeding.  Images permanently stored and available for review in the ultrasound unit.  Impression: Technically successful ultrasound guided injection.  Impression and Recommendations:    Primary osteoarthritis of both knees We finished a full series of Orthovisc back in July of this year, she is completely pain-free. She has however noting increasing swelling and effusion. Arthrocentesis with crystal analysis today, we will also start her on Saxenda.  Morbid obesity (HCC) Has tried multiple modalities including formal diet programs, exercise, calorie counting. Starting Saxenda. If  this does not get approved by her insurance company we can try Trulicity or Bydureon. She does have significant hyperglycemia.  ___________________________________________ Ihor Austin. Benjamin Stain, M.D., ABFM., CAQSM. Primary Care and Sports Medicine [facility]  Adjunct Professor of Family Medicine  [employer]

## 2018-09-25 NOTE — Assessment & Plan Note (Signed)
Has tried multiple modalities including formal diet programs, exercise, calorie counting. Starting Saxenda. If this does not get approved by her insurance company we can try Trulicity or Bydureon. She does have significant hyperglycemia.

## 2018-09-26 LAB — COMPREHENSIVE METABOLIC PANEL
ALT: 23 IU/L (ref 0–32)
AST: 30 IU/L (ref 0–40)
Albumin/Globulin Ratio: 1.7 (ref 1.2–2.2)
Albumin: 3.5 g/dL (ref 3.5–4.8)
Alkaline Phosphatase: 90 IU/L (ref 39–117)
BUN/Creatinine Ratio: 13 (ref 12–28)
BUN: 11 mg/dL (ref 8–27)
Bilirubin Total: 0.6 mg/dL (ref 0.0–1.2)
CO2: 27 mmol/L (ref 20–29)
Calcium: 8.9 mg/dL (ref 8.7–10.3)
Chloride: 100 mmol/L (ref 96–106)
Creatinine, Ser: 0.86 mg/dL (ref 0.57–1.00)
GFR calc Af Amer: 79 mL/min/{1.73_m2} (ref >59)
GFR calc non Af Amer: 68 mL/min/{1.73_m2} (ref >59)
Globulin, Total: 2.1 g/dL (ref 1.5–4.5)
Glucose: 100 mg/dL — ABNORMAL HIGH (ref 65–99)
Potassium: 4.1 mmol/L (ref 3.5–5.2)
Sodium: 139 mmol/L (ref 134–144)
Total Protein: 5.6 g/dL — ABNORMAL LOW (ref 6.0–8.5)

## 2018-09-26 LAB — ANEMIA PANEL
Ferritin: 295 ng/mL — ABNORMAL HIGH (ref 15–150)
Folate, Hemolysate: 349.3 ng/mL
Folate, RBC: 1012 ng/mL (ref >498)
Hematocrit: 34.5 % (ref 34.0–46.6)
Iron Saturation: 31 % (ref 15–55)
Iron: 71 ug/dL (ref 27–139)
Retic Ct Pct: 1.7 % (ref 0.6–2.6)
Total Iron Binding Capacity: 230 ug/dL — ABNORMAL LOW (ref 250–450)
UIBC: 159 ug/dL (ref 118–369)
Vitamin B-12: 530 pg/mL (ref 232–1245)

## 2018-09-26 LAB — SYNOVIAL FLUID, CRYSTAL

## 2018-09-26 LAB — MAGNESIUM: Magnesium: 2 mg/dL (ref 1.6–2.3)

## 2018-10-03 ENCOUNTER — Other Ambulatory Visit: Payer: Self-pay | Admitting: Family Medicine

## 2018-10-03 DIAGNOSIS — M1710 Unilateral primary osteoarthritis, unspecified knee: Secondary | ICD-10-CM

## 2018-10-03 DIAGNOSIS — M179 Osteoarthritis of knee, unspecified: Secondary | ICD-10-CM

## 2018-10-08 ENCOUNTER — Other Ambulatory Visit: Payer: Self-pay | Admitting: Family Medicine

## 2018-10-08 ENCOUNTER — Telehealth: Payer: Self-pay | Admitting: Family Medicine

## 2018-10-08 DIAGNOSIS — E039 Hypothyroidism, unspecified: Secondary | ICD-10-CM

## 2018-10-08 NOTE — Telephone Encounter (Signed)
Patient was returning call to [facility] who was unavailable. Patient is requesting a call back from her when available, please call at home number.

## 2018-10-10 NOTE — Telephone Encounter (Signed)
Called and left message for patient to call the office. MPulliam, CMA/RT(R)  

## 2018-10-22 ENCOUNTER — Other Ambulatory Visit: Payer: Self-pay | Admitting: *Deleted

## 2018-10-22 MED ORDER — MELOXICAM 15 MG PO TABS: 15.0000 mg | ORAL_TABLET | Freq: Every day | ORAL | 1 refills | Status: DC

## 2018-10-28 ENCOUNTER — Other Ambulatory Visit: Payer: Self-pay | Admitting: Family Medicine

## 2018-10-28 DIAGNOSIS — M549 Dorsalgia, unspecified: Principal | ICD-10-CM

## 2018-10-28 DIAGNOSIS — G8929 Other chronic pain: Secondary | ICD-10-CM

## 2018-10-30 ENCOUNTER — Other Ambulatory Visit: Payer: Self-pay | Admitting: Family Medicine

## 2018-10-31 ENCOUNTER — Other Ambulatory Visit: Payer: Self-pay | Admitting: Cardiology

## 2018-11-04 ENCOUNTER — Encounter: Payer: Self-pay | Admitting: Family Medicine

## 2018-11-04 ENCOUNTER — Ambulatory Visit (INDEPENDENT_AMBULATORY_CARE_PROVIDER_SITE_OTHER): Payer: Medicare Other | Admitting: Family Medicine

## 2018-11-04 VITALS — BP 142/82 | HR 72 | Temp 98.2°F | Ht 65.0 in | Wt 250.0 lb

## 2018-11-04 DIAGNOSIS — F1010 Alcohol abuse, uncomplicated: Secondary | ICD-10-CM

## 2018-11-04 DIAGNOSIS — I872 Venous insufficiency (chronic) (peripheral): Secondary | ICD-10-CM | POA: Diagnosis not present

## 2018-11-04 DIAGNOSIS — G629 Polyneuropathy, unspecified: Secondary | ICD-10-CM

## 2018-11-04 DIAGNOSIS — M1710 Unilateral primary osteoarthritis, unspecified knee: Secondary | ICD-10-CM

## 2018-11-04 DIAGNOSIS — I10 Essential (primary) hypertension: Secondary | ICD-10-CM

## 2018-11-04 DIAGNOSIS — I5032 Chronic diastolic (congestive) heart failure: Secondary | ICD-10-CM | POA: Diagnosis not present

## 2018-11-04 DIAGNOSIS — M792 Neuralgia and neuritis, unspecified: Secondary | ICD-10-CM

## 2018-11-04 DIAGNOSIS — M179 Osteoarthritis of knee, unspecified: Secondary | ICD-10-CM

## 2018-11-04 DIAGNOSIS — M62830 Muscle spasm of back: Secondary | ICD-10-CM

## 2018-11-04 NOTE — Patient Instructions (Signed)
How to Increase Your Level of Physical Activity Getting regular physical activity is important for your overall health and well-being. Most people do not get enough exercise. There are easy ways to increase your level of physical activity, even if you have not been very active in the past or you are just starting out. Why is physical activity important? Physical activity has many short-term and long-term health benefits. Regular exercise can:  Help you lose weight or maintain a healthy weight.  Strengthen your muscles and bones.  Boost your mood and improve self-esteem.  Reduce your risk of certain long-term (chronic) diseases, like heart disease, cancer, and diabetes.  Help you stay capable of walking and moving around (mobile) as you age.  Prevent accidents, such as falls, as you age.  Increase life expectancy.  What are the benefits of being physically active on a regular basis? In addition to improving your physical health, being physically active on most days of the week can help you in ways that you may not expect. Benefits of regular physical activity may include:  Feeling good about your body.  Being able to move around more easily and for longer periods of time without getting tired (increased stamina).  Finding new sources of fun and enjoyment.  Meeting new people who share a common interest.  Being able to fight off illness better (enhanced immunity).  Being able to sleep better.  What can happen if I am not physically active on a regular basis? Not getting enough physical activity can lead to an unhealthy lifestyle and future health problems. This can increase your chances of:  Becoming overweight or obese.  Becoming sick.  Developing chronic illnesses, like heart disease or diabetes.  Having mental health problems, like depression or anxiety.  Having sleep problems.  Having trouble walking or getting yourself around (reduced mobility).  Injuring yourself  in a fall as you get older.  What steps can I take to be more physically active?  Check with your health care provider about how to get started. Ask your health care provider what activities are safe for you.  Start out slowly. Walking or doing some simple chair exercises is a good place to start, especially if you have not been active before or for a long time.  Try to find activities that you enjoy. You are more likely to commit to an exercise routine if it does not feel like a chore.  If you have bone or joint problems, choose low-impact exercises, like walking or swimming.  Include physical activity in your everyday routine.  Invite friends or family members to exercise with you. This also will help you commit to your workout plan.  Set goals that you can work toward.  Aim for at least 150 minutes of moderate-intensity exercise each week. Examples of moderate-intensity exercise include walking or riding a bike. Where to find more information:  Centers for Disease Control and Prevention: www.cdc.gov/physicalactivity/index.html  President's Council on Fitness, Sports & Nutrition www.fitness.gov/resource-center  ChooseMyPlate: www.choosemyplate.gov/physical-activity Contact a health care provider if:  You have headaches, muscle aches, or joint pain.  You feel dizzy or light-headed while exercising.  You faint.  You have chest pain while exercising. Summary  Exercise benefits your mind and body at any age, even if you are just starting out.  If you have a chronic illness or have not been active for a while, check with your health care provider before increasing your physical activity.  Choose activities that are safe and enjoyable   for you.Ask your health care provider what activities are safe for you.  Start slowly. Tell your health care provider if you have problems as you start to increase your activity level. This information is not intended to replace advice given to  you by your health care provider. Make sure you discuss any questions you have with your health care provider. Document Released: 11/02/2016 Document Revised: 11/02/2016 Document Reviewed: 11/02/2016 Elsevier Interactive Patient Education  2018 [employer]  

## 2018-11-04 NOTE — Progress Notes (Signed)
Impression and Recommendations:    1. Diastolic dysfunction with chronic heart failure (HCC)   2. Chronic venous stasis dermatitis of both lower extremities   3. Morbid obesity (HCC)   4. Essential hypertension   5. Alcohol abuse-with elevated ALT and AST   6. Muscle spasm of back   7. Peripheral neuropathy due to edema b/l feet   8. Osteoarthritis of knee, unspecified laterality, unspecified osteoarthritis type     - Need for Shingles vaccine. - Need for CPE, Medicare Wellness visit.  - Reviewed critical need for up-to-date health screenings.  1. Alcohol Abuse/elevated ALT/AST Liver Enzymes - Liver enzymes improved since last check. - AST 30 on 09/25/2018, down from 33 on 09/09/2018. - ALT 23 on 09/25/2018, down from 73 on 09/09/2018.  - Strongly encouraged patient to minimize her alcohol intake.  - Patient understands the importance of decreasing her alcohol intake, including the benefits it will have on her overall health.  2. Hypertension - Blood pressure elevated on intake today. - Continue treatment plan as prescribed.  See med list below. - Patient tolerating meds well without complication.  Denies S-E  - Lifestyle changes such as dash diet and engaging in a regular exercise program discussed with patient.  Educational handouts provided.  - Ambulatory BP monitoring STRONGLY encouraged. Keep log and bring in next OV.  3. Osteoarthritis of the Knee - Advised patient to continue to follow up with specialist PRN.  - Otherwise, will continue to monitor.  4. Diastolic dysfunction with chronic heart failure (HCC)  - Reviewed red flag symptoms of CHF in depth with patient today.  - Discussed that alcohol and salt will contribute to fluid retention. - Encouraged patient several times, at length, to reduce salt and alcohol consumption.  5. Chronic venous stasis dermatitis of both lower extremities  - Advised patient to continue to utilize muscle floss bands/wraps  on the lower extremities to minimize LE edema.  - Encouraged adequate hydration and increased physical activity to reduce LE edema.  - Will continue to monitor.  6. Peripheral neuropathy due to edema b/l feet - Pt continues her Neurontin as prescribed. - Will continue to monitor.  7. Back Spasm - Discussed importance of patient moving around more and strengthening her body.  - Reviewed that "less medicine is better," for the organs and the heart.  - STRONGLY encouraged patient to begin an exercise and physical activity routine.  - Otherwise, patient may continue to use her flexeril Rx sparingly PRN.  8. BMI Counseling - 41.6, Morbidly Obese Explained to patient what BMI refers to, and what it means medically.    Told patient to think about it as a "medical risk stratification measurement" and how increasing BMI is associated with increasing risk/ or worsening state of various diseases such as hypertension, hyperlipidemia, diabetes, premature OA, depression etc.  American Heart Association guidelines for healthy diet, basically Mediterranean diet, and exercise guidelines of 30 minutes 5 days per week or more discussed in detail.  Health counseling performed.  All questions answered.  9. Lifestyle & Preventative Health Maintenance - Advised patient to continue working toward exercising to improve overall mental, physical, and emotional health.    - Reviewed the "spokes of the wheel" of mood and health management.  Stressed the importance of ongoing prudent habits, including regular exercise, appropriate sleep hygiene, healthful dietary habits, and prayer/meditation to relax.  - Encouraged patient to engage in daily physical activity, especially a formal exercise routine.  Recommended  that the patient eventually strive for at least 150 minutes of moderate cardiovascular activity per week according to guidelines established by the [facility].   - Healthy dietary habits encouraged, including  low-carb, and high amounts of lean protein in diet.   - Patient should also consume adequate amounts of water.  Expresses verbal understanding and consents to current therapy and treatment regimen.  No barriers to understanding were identified.  Red flag symptoms and signs discussed in detail.  Patient expressed understanding regarding what to do in case of emergency\urgent symptoms  Please see AVS handed out to patient at the end of our visit for further patient instructions/ counseling done pertaining to today's office visit.   Return for Medicare wellness visit -  in the new year.     Note:  This note was prepared with assistance of Dragon voice recognition software. Occasional wrong-word or sound-a-like substitutions may have occurred due to the inherent limitations of voice recognition software.   This document serves as a record of services personally performed by Thomasene Loteborah Marquett Bertoli, DO. It was created on her behalf by Peggye FothergillKatherine Galloway, a trained medical scribe. The creation of this record is based on the scribe's personal observations and the provider's statements to them.   I have reviewed the above medical documentation for accuracy and completeness and I concur.  Thomasene Loteborah Mekaila Tarnow, DO   ------------------------------------------------------------------------------------------------------    Subjective:     HPI: [name] is a 72 y.o. female who [facility] today for issues as discussed below.  Patient was at the dentist for 4 hours this morning.  Says she's been drinking 2-3 quarts of water per day.  "I was feeling like I was getting one thing after another, but I feel like I've gotten on top of it again."  Recent Cold & Fluid Retention Overall, she's been feeling better.  Notes she put 11 lbs on in a week due to fluid retention.  "I felt a little short of breath with all of that."  She diuresed after this weight gain, and  then came down with a cold virus.  Joint concerns - Osteoarthritis Notes her joints are "very creaky."  She was seeing Dr. Karie Schwalbe for a while and notes now she only sees him PRN.  "They get better if I walk."  Backache/Back Spasm Patient uses flexeril when she can feel her back "starting to twist around."  Daily Activities She's trying to make sure she goes out every day.  Has been trying to move around more.  [place] "but there's still a lot of walking around."  Notes she has a lot of Christmas shopping to do, and is going on a week to week-and-a-half long trip to [place].  She and her [place] are going to [place] in [place].  Weight States that her weight had been up, but she's trying to get it back down.  Lower Extremity Swelling Patient notes she uses wraps on her legs to assist with the swelling.  "[The legs are] better in the morning, and I wrap them up right away."  1. HTN HPI:  -  Her blood pressure may or may not be controlled at home.  Pt has not been checking it at home.  Notes her cuff "corroded over time."  Patient notes she's surprised her BP isn't up a lot higher, because she's hurting from her dentist appointment this morning.  She went in at 9:00 AM and was there  until about 1:30 PM.  Notes her pain is currently a 4 or 5.  Takes tylenol at home to alleviate pain, and takes one ultram/tramadol per day "if that."  Was doing better with drinking, "and then we went to [place]."  And her husband won big in [place] and in [place].  Says she plans to cut her drinking back down to "one in the evening with him."  - Patient reports good compliance with blood pressure medications  - Denies medication S-E   - Smoking Status noted   - She denies new onset of: chest pain, exercise intolerance, shortness of breath, dizziness, visual changes, headache, lower extremity swelling or claudication.   Overall feeling better with chest pain, SOB, etc.  Says that she sometimes  had to stop and catch her breath.  Last 3 blood pressure readings in our office are as follows: BP Readings from Last 3 Encounters:  11/04/18 (!) 142/82  09/25/18 130/75  09/09/18 106/73    Filed Weights   11/04/18 1523  Weight: 250 lb (113.4 kg)      Wt Readings from Last 3 Encounters:  11/04/18 250 lb (113.4 kg)  09/09/18 249 lb (112.9 kg)  07/04/18 256 lb (116.1 kg)   BP Readings from Last 3 Encounters:  11/04/18 (!) 142/82  09/25/18 130/75  09/09/18 106/73   Pulse Readings from Last 3 Encounters:  11/04/18 72  09/25/18 76  09/09/18 63   BMI Readings from Last 3 Encounters:  11/04/18 41.60 kg/m  09/09/18 41.44 kg/m  07/04/18 42.60 kg/m     Patient Care Team    Relationship Specialty Notifications Start End  Thomasene Lot, DO PCP - General Family Medicine  06/14/16   Monica Becton, MD Consulting Physician Sports Medicine  11/08/16    Comment: OA- knee injections; PT  Jerolyn Shin, MD Consulting Physician Neurology  09/25/17    Comment: neuropsych- seen him for pinched nerves in neck in past  Marykay Lex, MD Consulting Physician Cardiology  12/03/17      Patient Active Problem List   Diagnosis Date Noted  . Hypertriglyceridemia 09/29/2016    Priority: High  . HLD (hyperlipidemia) 09/29/2016    Priority: High  . Adjustment disorder with mixed anxiety and depressed mood 09/29/2016    Priority: High  . Morbid obesity (HCC) 06/18/2016    Priority: High  . R Congenital Kidney ABN: Ess only has one fxn kidney 06/18/2016    Priority: High  . Hypertension 04/25/2015    Priority: High  . Alcohol abuse-with elevated ALT and AST 04/25/2015    Priority: High  . H/O gastric bypass 06/18/2016    Priority: Medium  . Peripheral edema- b/l L Ext w chronic skin changes 06/18/2016    Priority: Medium  . GAD (generalized anxiety disorder) 06/14/2016    Priority: Medium  . UGIB (upper gastrointestinal bleed) 04/25/2015    Priority: Medium  .  Acute ulcer at gastrojejunal region- post gastric bypass     Priority: Medium  . Vitamin D deficiency 09/29/2016    Priority: Low  . h/o Iron deficiency anemia 06/14/2016    Priority: Low  . Hypothyroidism 02/13/2016    Priority: Low  . Weakness generalized 09/09/2018  . Osteoarthritis of knee 07/04/2018  . Chronic venous stasis dermatitis of both lower extremities 03/11/2018  . B12 deficiency 03/04/2018  . Cellulitis of right leg 12/18/2017  . Peripheral neuropathy due to edema b/l feet 12/03/2017  . Decreased pedal pulses 10/27/2017  . Diastolic dysfunction  with chronic heart failure (HCC) 10/25/2017  . Aortic systolic murmur on examination 10/02/2017  . Preoperative cardiovascular examination 10/02/2017  . Exertional dyspnea 09/25/2017  . Bilateral lower extremity edema 11/07/2016  . Primary osteoarthritis of both knees 11/02/2016  . History of hysterectomy 06/18/2016  . Insomnia due to alcohol excess and stress 06/18/2016  . Muscle spasm of back 06/14/2016  . Stress at home 02/13/2016  . Distal radius fracture, right 01/15/2013    Past Medical history, Surgical history, Family history, Social history, Allergies and Medications have been entered into the medical record, reviewed and changed as needed.    Current Meds  Medication Sig  . acetaminophen (TYLENOL) 500 MG tablet Take 1,000 mg by mouth daily.   Marland Kitchen atorvastatin (LIPITOR) 80 MG tablet Take 1 tablet (80 mg total) by mouth at bedtime.  . carvedilol (COREG) 6.25 MG tablet TAKE 1 TABLET BY MOUTH 2 TIMES A DAY WITH A MEAL  . Cholecalciferol (VITAMIN D3) 5000 units TABS 5,000 IU OTC vitamin D3 daily. (Patient taking differently: Take 15,000 Units by mouth daily. 5,000 IU OTC vitamin D3 daily.)  . cyanocobalamin (,VITAMIN B-12,) 1000 MCG/ML injection INJECT 1 ML INTO THE MUSCLE EVERY 30 DAYS.  Marland Kitchen cyclobenzaprine (FLEXERIL) 10 MG tablet TAKE 1 TABLET BY MOUTH 3 TIMES DAILY AS NEEDED FOR MUSCLE SPASMS.  Marland Kitchen diphenhydrAMINE  (BENADRYL) 25 MG tablet Take 25 mg by mouth at bedtime as needed.   . ferrous sulfate 325 (65 FE) MG tablet Take 325 mg by mouth daily with breakfast.  . furosemide (LASIX) 40 MG tablet Take 0.5 tablets (20 mg total) by mouth daily.  Marland Kitchen gabapentin (NEURONTIN) 300 MG capsule Take 3 capsules (900 mg total) by mouth 3 (three) times daily.  Marland Kitchen levothyroxine (SYNTHROID, LEVOTHROID) 150 MCG tablet TAKE 1 TABLET BY MOUTH DAILY.  . magnesium 30 MG tablet Take 30 mg by mouth 2 (two) times a week. Patient takes prn and takes 1 gram  . Melatonin 5 MG CAPS Take 10 mg by mouth at bedtime.   . meloxicam (MOBIC) 15 MG tablet Take 1 tablet (15 mg total) by mouth daily.  . Multiple Vitamin (MULTIVITAMIN) tablet Take 1 tablet by mouth daily.  . mupirocin ointment (BACTROBAN) 2 % Apply to affected wounds twice daily.  . pantoprazole (PROTONIX) 40 MG tablet TAKE 1 TABLET BY MOUTH DAILY.  . traMADol (ULTRAM) 50 MG tablet Take 1 tablet (50 mg total) by mouth 2 (two) times daily. Maximum 6 tabs per day.  . [DISCONTINUED] benazepril-hydrochlorthiazide (LOTENSIN HCT) 20-25 MG tablet TAKE 1 TABLET BY MOUTH DAILY.    Allergies:  Allergies  Allergen Reactions  . Cocoa Anaphylaxis  . Red Dye Hives    Rash and Nausea diarrhea  . Lyrica [Pregabalin] Other (See Comments)    Abnormal bleeding  . Sulfa Antibiotics Rash     Review of Systems:  A fourteen system review of systems was performed and found to be positive as per HPI.   Objective:   Blood pressure (!) 142/82, pulse 72, temperature 98.2 F (36.8 C), height 5\' 5"  (1.651 m), weight 250 lb (113.4 kg), SpO2 98 %. Body mass index is 41.6 kg/m. General:  Well Developed, well nourished, appropriate for stated age.  Neuro:  Alert and oriented,  extra-ocular muscles intact  HEENT:  Normocephalic, atraumatic, neck supple, no carotid bruits appreciated  Skin:  no gross rash, warm, pink. Cardiac:  RRR, S1 S2 Respiratory:  ECTA B/L and A/P, Not using accessory  muscles, speaking in full  sentences- unlabored. Vascular:  Ext warm, no cyanosis apprec.; cap RF less 2 sec. Psych:  No HI/SI, judgement and insight good, Euthymic mood. Full Affect.

## 2018-11-05 ENCOUNTER — Other Ambulatory Visit: Payer: Self-pay | Admitting: Family Medicine

## 2018-11-05 DIAGNOSIS — I10 Essential (primary) hypertension: Secondary | ICD-10-CM

## 2018-11-11 ENCOUNTER — Other Ambulatory Visit: Payer: Self-pay | Admitting: Family Medicine

## 2018-11-11 DIAGNOSIS — M792 Neuralgia and neuritis, unspecified: Principal | ICD-10-CM

## 2018-11-11 DIAGNOSIS — G629 Polyneuropathy, unspecified: Secondary | ICD-10-CM

## 2018-12-04 ENCOUNTER — Emergency Department (HOSPITAL_COMMUNITY)
Admission: EM | Admit: 2018-12-04 | Discharge: 2018-12-05 | Disposition: A | Discharge status: Home / Self Care | Payer: Medicare PPO | Attending: Emergency Medicine | Admitting: Emergency Medicine

## 2018-12-04 ENCOUNTER — Encounter (HOSPITAL_COMMUNITY): Payer: Self-pay

## 2018-12-04 ENCOUNTER — Emergency Department (HOSPITAL_COMMUNITY): Payer: Medicare PPO

## 2018-12-04 DIAGNOSIS — Y939 Activity, unspecified: Secondary | ICD-10-CM | POA: Insufficient documentation

## 2018-12-04 DIAGNOSIS — W19XXXA Unspecified fall, initial encounter: Secondary | ICD-10-CM | POA: Insufficient documentation

## 2018-12-04 DIAGNOSIS — Y929 Unspecified place or not applicable: Secondary | ICD-10-CM | POA: Insufficient documentation

## 2018-12-04 DIAGNOSIS — S022XXB Fracture of nasal bones, initial encounter for open fracture: Secondary | ICD-10-CM

## 2018-12-04 DIAGNOSIS — S0081XA Abrasion of other part of head, initial encounter: Secondary | ICD-10-CM

## 2018-12-04 DIAGNOSIS — I10 Essential (primary) hypertension: Secondary | ICD-10-CM | POA: Insufficient documentation

## 2018-12-04 DIAGNOSIS — S0990XA Unspecified injury of head, initial encounter: Secondary | ICD-10-CM | POA: Diagnosis present

## 2018-12-04 DIAGNOSIS — Z79899 Other long term (current) drug therapy: Secondary | ICD-10-CM | POA: Insufficient documentation

## 2018-12-04 DIAGNOSIS — Y999 Unspecified external cause status: Secondary | ICD-10-CM | POA: Insufficient documentation

## 2018-12-04 DIAGNOSIS — E039 Hypothyroidism, unspecified: Secondary | ICD-10-CM | POA: Insufficient documentation

## 2018-12-04 DIAGNOSIS — S022XXA Fracture of nasal bones, initial encounter for closed fracture: Secondary | ICD-10-CM | POA: Diagnosis not present

## 2018-12-04 IMAGING — CT CT CERVICAL SPINE W/O CM
2 of 11 series · 7 of 33 positions shown, 8 images · non-contrast
Comparison: None.

CLINICAL DATA: Mechanical fall earlier today hitting face on the
sidewalk. Abrasion to the nose.

EXAM:
CT HEAD WITHOUT CONTRAST
CT MAXILLOFACIAL WITHOUT CONTRAST
CT CERVICAL SPINE WITHOUT CONTRAST
TECHNIQUE: Multidetector CT imaging of the head, cervical spine, and
maxillofacial structures were performed using the standard protocol
without intravenous contrast. Multiplanar CT image reconstructions
of the cervical spine and maxillofacial structures were also
generated.

[Series 8: facialbone 2.0 st · axial · 0.37mm/px · z∈[-248,-86]mm · 3 of 82 slices shown, 4 images]
[im 1/82  soft-tissue]
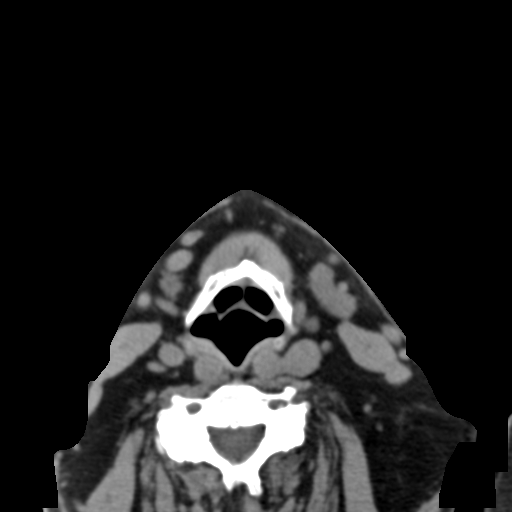
[im 1/82  bone]
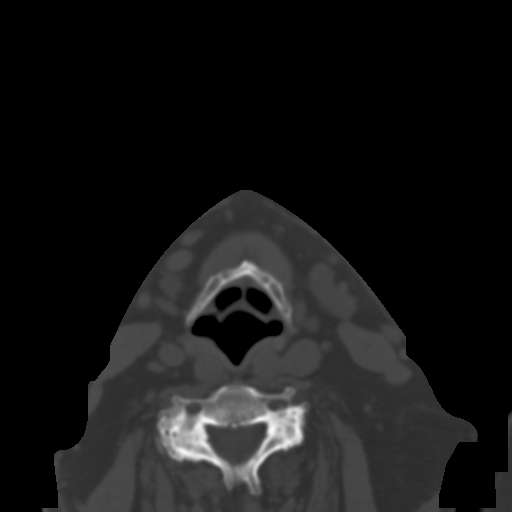
[im 41/82  bone]
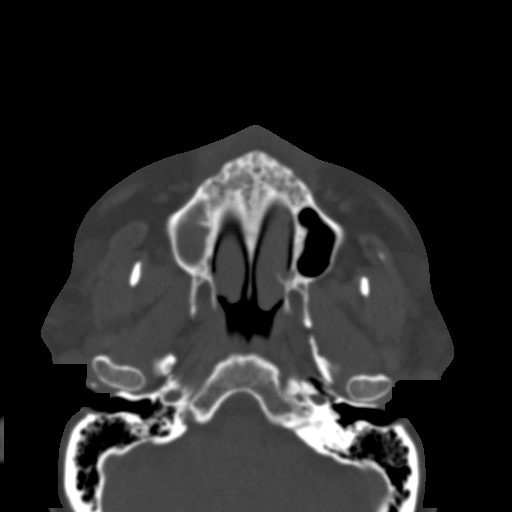
[im 82/82  bone]
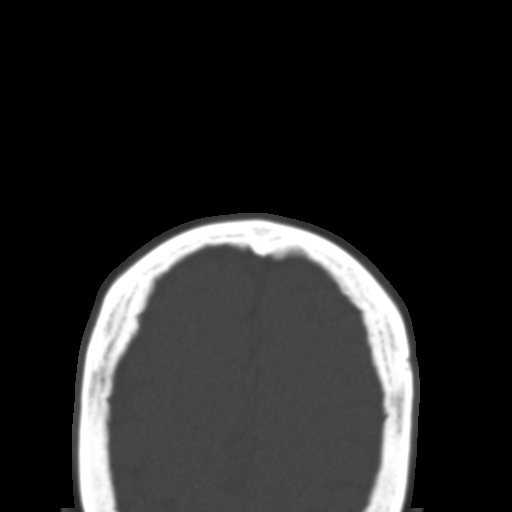

[Series 13: facialbone 2.0 sag st · sagittal · 0.32mm/px · 4 of 98 slices shown]
[im 20/98  bone]
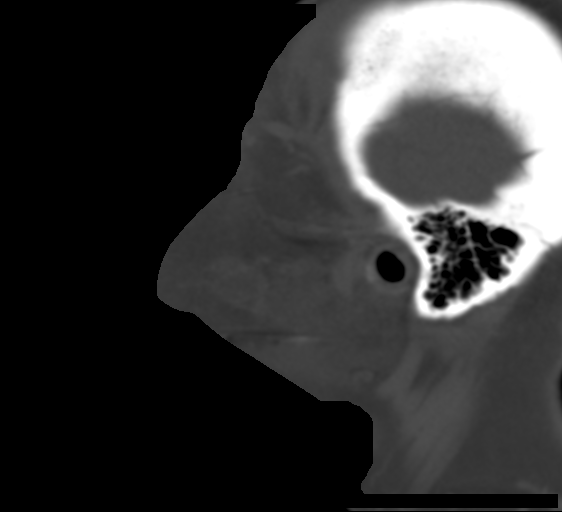
[im 39/98  bone]
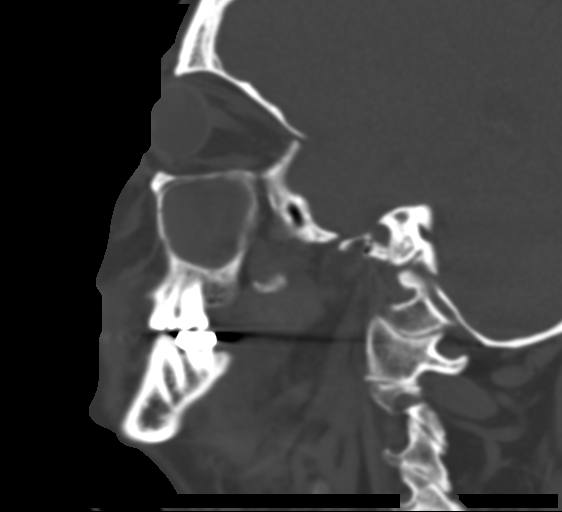
[im 59/98  bone]
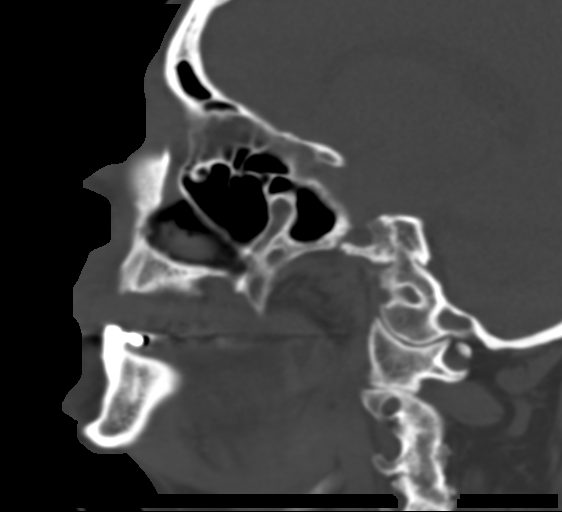
[im 78/98  bone]
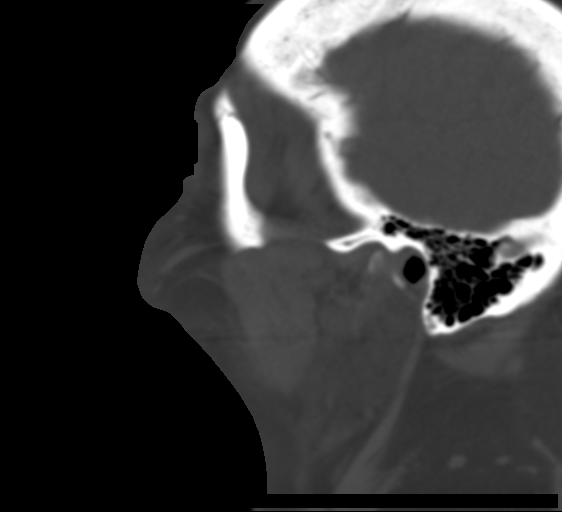

[7 of 33 positions shown; findings below may reference images not displayed]

FINDINGS: CT HEAD FINDINGS

Brain: No acute intracranial hemorrhage, edema or midline shift. No
large vascular territory infarct. No intra-axial mass nor
extra-axial fluid. Minimal small vessel ischemic disease of
periventricular white matter is identified. Midline fourth ventricle
and basal cisterns without effacement.

Vascular: Mild atherosclerosis of the carotid siphons. No hyperdense
vessel sign.

Skull: Intact bony calvarium.

Other: Mild perinasal soft tissue swelling.

CT MAXILLOFACIAL FINDINGS

Osseous: Acute, slightly angulated and minimally displaced right
nasal bone fracture with anterior nasal septal. Associated soft
tissue swelling is seen. The remainder of the maxillofacial
structures are intact.

Orbits: Intact

Sinuses: Complete opacification of the right maxillary sinus with
bulging of the sinus walls suggesting a mucocele. No air-fluid
levels are identified.

Soft tissues: Mild posttraumatic perinasal soft tissue swelling.

CT CERVICAL SPINE FINDINGS

Alignment: Maintained cervical lordosis.

Skull base and vertebrae: Intact

Soft tissues and spinal canal: No prevertebral fluid or swelling. No
visible canal hematoma.

Disc levels: Moderate to marked disc flattening C5 through C7.
Uncovertebral joint osteoarthritis with uncinate spurring, the left
at C4-5 and bilaterally at C5-6 and C6-7. Slight left-sided C4-5
foraminal encroachment from facet arthropathy and uncinate spurring
and likewise on the right at C5-6 and C6-7. Multilevel degenerative
facet arthropathy.

Upper chest: No acute abnormality

Other: None
IMPRESSION: 1. No acute intracranial abnormality. Likely chronic minimal small
vessel ischemic disease periventricular white matter.
2. Acute, slightly angulated and minimally displaced right nasal
bone fracture with associated soft tissue swelling. Acute anterior
nasal septal fracture without significant displacement.
3. Complete opacification of the right maxillary sinus with bulging
the sinus walls compatible with a mucocele of the right maxillary
sinus.
4. Cervical spondylosis without acute cervical spine fracture.

## 2018-12-04 NOTE — ED Triage Notes (Signed)
Pt reports mechanical fall earlier today, she hit her face on the sidewalk, abrasion noted to nose, and small abrasion to toe on right foot. Pt denies LOC or blood thinners. Pt having neck pain.

## 2018-12-05 MED ORDER — TETANUS-DIPHTH-ACELL PERTUSSIS 5-2.5-18.5 LF-MCG/0.5 IM SUSP
0.5000 mL | Freq: Once | INTRAMUSCULAR | Status: AC
  Administered 2018-12-05: 0.5 mL via INTRAMUSCULAR
  Filled 2018-12-05: qty 0.5

## 2018-12-05 MED ORDER — HYDROCODONE-ACETAMINOPHEN 5-325 MG PO TABS: 1.0000 | ORAL_TABLET | Freq: Four times a day (QID) | ORAL | 0 refills | Status: DC | PRN

## 2018-12-05 MED ORDER — AMOXICILLIN-POT CLAVULANATE 875-125 MG PO TABS: 1.0000 | ORAL_TABLET | Freq: Two times a day (BID) | ORAL | 0 refills | Status: DC

## 2018-12-05 NOTE — Discharge Instructions (Signed)
Contact a health care provider if: Your pain increases or becomes severe. You continue to have nosebleeds. The shape of your nose does not return to normal within 5 days. You have pus draining out of your nose. Get help right away if: You have bleeding from your nose that does not stop after you pinch your nostrils closed for 20 minutes and keep ice on your nose. You have clear fluid draining out of your nose. You notice swelling near the septum inside the nose. This swelling is a septal hematoma that must be drained to help prevent infection. You have difficulty moving your eyes. You have repeated vomiting. 

## 2018-12-05 NOTE — ED Provider Notes (Signed)
[facility] EMERGENCY DEPARTMENT Provider Note   CSN: [id number] Arrival date & time: 12/04/18  1941     History   Chief Complaint Chief Complaint  Patient presents with  . Fall    HPI [name] is a 73 y.o. female who presents the emergency department chief complaint of mechanical fall.  [place].  She has been trying to increase her exercise.  She dropped something and states that she thought she had appropriate center of gravity but when she leaned over to grab the object she lost her balance and fell forward directly onto her face.  She suffered multiple abrasions.  She did not knock herself out.  She denies any neck pain or upper extremity paresthesia or weakness.  Complains of pain in her nasal region she had epistaxis for about 2 hours prior to arrival.  She is not on any blood thinning medications.  HPI  Past Medical History:  Diagnosis Date  . Alcohol abuse 04/25/2015  . Allergy   . Anastomotic ulcer S/P gastric bypass 06/18/2016  . Arthritis   . Cataract   . Degenerative disc disease, cervical   . GAD (generalized anxiety disorder) 06/14/2016  . HTN (hypertension) 04/25/2015  . Hypertension   . Hypothyroidism   . 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 Substance abuse (HCC)    Alcohol abuse 2016  . UGIB (upper gastrointestinal bleed) 04/25/2015    Patient Active Problem List   Diagnosis Date Noted  . Weakness generalized 09/09/2018  . Osteoarthritis of knee 07/04/2018  . Chronic venous stasis dermatitis of both lower extremities 03/11/2018  . B12 deficiency 03/04/2018  . Cellulitis of right leg 12/18/2017  . Peripheral neuropathy due to edema b/l feet 12/03/2017  . Decreased pedal pulses 10/27/2017  . Diastolic dysfunction with chronic heart failure (HCC) 10/25/2017  . Aortic systolic murmur on examination 10/02/2017  .  Preoperative cardiovascular examination 10/02/2017  . Exertional dyspnea 09/25/2017  . Bilateral lower extremity edema 11/07/2016  . Primary osteoarthritis of both knees 11/02/2016  . Hypertriglyceridemia 09/29/2016  . HLD (hyperlipidemia) 09/29/2016  . Vitamin D deficiency 09/29/2016  . Adjustment disorder with mixed anxiety and depressed mood 09/29/2016  . Morbid obesity (HCC) 06/18/2016  . R Congenital Kidney ABN: Ess only has one fxn kidney 06/18/2016  . History of hysterectomy 06/18/2016  . H/O gastric bypass 06/18/2016  . Peripheral edema- b/l L Ext w chronic skin changes 06/18/2016  . Insomnia due to alcohol excess and stress 06/18/2016  . GAD (generalized anxiety disorder) 06/14/2016  . h/o Iron deficiency anemia 06/14/2016  . Muscle spasm of back 06/14/2016  . Hypothyroidism 02/13/2016  . Stress at home 02/13/2016  . UGIB (upper gastrointestinal bleed) 04/25/2015  . Hypertension 04/25/2015  . Alcohol abuse-with elevated ALT and AST 04/25/2015  . Acute ulcer at gastrojejunal region- post gastric bypass   . Distal radius fracture, right 01/15/2013    Past Surgical History:  Procedure Laterality Date  . CHOLECYSTECTOMY    . COSMETIC SURGERY    . DIAGNOSTIC LAPAROSCOPY    . ESOPHAGOGASTRODUODENOSCOPY N/A 04/25/2015   Procedure: ESOPHAGOGASTRODUODENOSCOPY (EGD);  Surgeon: Beverley Fiedler, MD;  Location: [facility] ENDOSCOPY;  Service: Endoscopy;  Laterality: N/A;  . FRACTURE SURGERY    . GASTRIC BYPASS    . NM MYOVIEW LTD  10/10/2017   Normal LV size and function EF  72%.  Medium sized mild severity defect in the apical septal apical lateral and apical wall suggestive of breast attenuation.  LOW RISK.  No ischemia or infarction.    . OPEN REDUCTION INTERNAL FIXATION (ORIF) DISTAL RADIAL FRACTURE Right 01/15/2013   Procedure: OPEN REDUCTION INTERNAL FIXATION (ORIF) Right DISTAL RADIUS FRACTURE;  Surgeon: Eldred Manges, MD;  Location: [facility] OR;  Service: Orthopedics;  Laterality: Right;  .  TRANSTHORACIC ECHOCARDIOGRAM  10/04/2017   Mild LVH.  EF 60-65%.  Grade 2/moderate diastolic dysfunction.  Mild pulmonary hypertension.  No valve lesions     OB History   No obstetric history on file.      Home Medications    Prior to Admission medications   Medication Sig Start Date End Date Taking? Authorizing Provider  acetaminophen (TYLENOL) 500 MG tablet Take 1,000 mg by mouth daily.     [provider]  atorvastatin (LIPITOR) 80 MG tablet Take 1 tablet (80 mg total) by mouth at bedtime. 07/04/18   Opalski, Gavin Pound, DO  benazepril-hydrochlorthiazide (LOTENSIN HCT) 20-25 MG tablet TAKE 1 TABLET BY MOUTH DAILY. 11/06/18   Opalski, Deborah, DO  carvedilol (COREG) 6.25 MG tablet TAKE 1 TABLET BY MOUTH 2 TIMES A DAY WITH A MEAL 10/31/18   Lewayne Bunting, MD  Cholecalciferol (VITAMIN D3) 5000 units TABS 5,000 IU OTC vitamin D3 daily. Patient taking differently: Take 15,000 Units by mouth daily. 5,000 IU OTC vitamin D3 daily. 06/28/16   Opalski, Deborah, DO  cyanocobalamin (,VITAMIN B-12,) 1000 MCG/ML injection INJECT 1 ML INTO THE MUSCLE EVERY 30 DAYS. 10/04/18   Opalski, Deborah, DO  cyclobenzaprine (FLEXERIL) 10 MG tablet TAKE 1 TABLET BY MOUTH 3 TIMES DAILY AS NEEDED FOR MUSCLE SPASMS. 10/29/18   Opalski, Gavin Pound, DO  diphenhydrAMINE (BENADRYL) 25 MG tablet Take 25 mg by mouth at bedtime as needed.     [provider]  ferrous sulfate 325 (65 FE) MG tablet Take 325 mg by mouth daily with breakfast.    [provider]  furosemide (LASIX) 40 MG tablet Take 0.5 tablets (20 mg total) by mouth daily. 10/25/17   Marykay Lex, MD  gabapentin (NEURONTIN) 300 MG capsule TAKE 3 CAPSULES (900 MG TOTAL) BY MOUTH 3 (THREE) TIMES DAILY. 11/12/18   Opalski, Deborah, DO  levothyroxine (SYNTHROID, LEVOTHROID) 150 MCG tablet TAKE 1 TABLET BY MOUTH DAILY. 10/08/18   Opalski, Gavin Pound, DO  Liraglutide -Weight Management (SAXENDA) 18 MG/3ML SOPN Inject 3 mg into the skin daily. 0.6  mg inj subcut daily for 1 week, then incr by 0.6 mg weekly until reaching 3 mg injected subcut daily Patient not taking: Reported on 11/04/2018 09/25/18   Monica Becton, MD  magnesium 30 MG tablet Take 30 mg by mouth 2 (two) times a week. Patient takes prn and takes 1 gram    [provider]  Melatonin 5 MG CAPS Take 10 mg by mouth at bedtime.     [provider]  meloxicam (MOBIC) 15 MG tablet Take 1 tablet (15 mg total) by mouth daily. 10/22/18   Monica Becton, MD  Multiple Vitamin (MULTIVITAMIN) tablet Take 1 tablet by mouth daily.    [provider]  mupirocin ointment (BACTROBAN) 2 % Apply to affected wounds twice daily. 03/11/18   Opalski, Deborah, DO  pantoprazole (PROTONIX) 40 MG tablet TAKE 1 TABLET BY MOUTH DAILY. 10/30/18   Opalski, Gavin Pound, DO  traMADol (ULTRAM) 50 MG tablet Take 1 tablet (50 mg total) by mouth 2 (two) times daily.  Maximum 6 tabs per day. 06/11/18   Monica Becton, MD    Family History Family History  Problem Relation Age of Onset  . Heart disease Mother   . Stroke Mother 70  . Cancer Mother        skin and breast  . Hypertension Mother   . Breast cancer Mother 51  . COPD Father   . Cancer Father   . Diabetes Father   . Hypertension Father   . Stroke Maternal Grandmother   . Mental illness Maternal Grandmother   . Breast cancer Sister   . Cancer Cousin 56    Social History Social History   Tobacco Use  . Smoking status: Never Smoker  . Smokeless tobacco: Never Used  Substance Use Topics  . Alcohol use: Yes    Alcohol/week: 28.0 standard drinks    Types: 28 Glasses of wine per week    Comment: drinks daily and has a bottle of wine per day  . Drug use: No     Allergies   Cocoa; Red dye; Lyrica [pregabalin]; and Sulfa antibiotics   Review of Systems Review of Systems   Physical Exam Updated Vital Signs BP (!) 177/91 (BP Location: Right Arm)   Pulse 78   Temp (!) 97.4 F (36.3 C) (Oral)    Resp 16   SpO2 100%   Physical Exam Vitals signs and nursing note reviewed.  Constitutional:      General: She is not in acute distress.    Appearance: She is well-developed. She is not diaphoretic.  HENT:     Head: Normocephalic. Raccoon eyes, abrasion, right periorbital erythema and left periorbital erythema present. No Battle's sign.     Jaw: There is normal jaw occlusion. No trismus, pain on movement or malocclusion.     Comments: Patient with bilateral black eyes, abrasions over the region between the eyebrows, nasal bridge and down the length of stay of the nose. Dried blood present in the right naris.  Left naris is clear without blood. Evidence of septal hematoma     Right Ear: Hearing and tympanic membrane normal. No hemotympanum.     Left Ear: Hearing and tympanic membrane normal. No hemotympanum.     Nose: Nasal deformity and signs of injury present.     Right Nostril: No epistaxis or septal hematoma.     Left Nostril: No epistaxis or septal hematoma.     Mouth/Throat:     Mouth: Mucous membranes are moist.     Pharynx: Oropharynx is clear. Uvula midline.  Eyes:     General: No scleral icterus.    Extraocular Movements: Extraocular movements intact.     Conjunctiva/sclera: Conjunctivae normal.     Comments: Extraocular movements intact, no pain with eye movement  Neck:     Musculoskeletal: Normal range of motion.  Cardiovascular:     Rate and Rhythm: Normal rate and regular rhythm.     Heart sounds: Normal heart sounds. No murmur. No friction rub. No gallop.   Pulmonary:     Effort: Pulmonary effort is normal. No respiratory distress.     Breath sounds: Normal breath sounds.  Abdominal:     General: Bowel sounds are normal. There is no distension.     Palpations: Abdomen is soft. There is no mass.     Tenderness: There is no abdominal tenderness. There is no guarding.  Skin:    General: Skin is warm and dry.  Neurological:     Mental Status: She  is alert and  oriented to person, place, and time.  Psychiatric:        Behavior: Behavior normal.      ED Treatments / Results  Labs (all labs ordered are listed, but only abnormal results are displayed) Labs Reviewed - No data to display  EKG None  Radiology Ct Head Wo Contrast  Result Date: 12/04/2018 CLINICAL DATA:  Mechanical fall earlier today hitting face on the sidewalk. Abrasion to the nose. EXAM: CT HEAD WITHOUT CONTRAST CT MAXILLOFACIAL WITHOUT CONTRAST CT CERVICAL SPINE WITHOUT CONTRAST TECHNIQUE: Multidetector CT imaging of the head, cervical spine, and maxillofacial structures were performed using the standard protocol without intravenous contrast. Multiplanar CT image reconstructions of the cervical spine and maxillofacial structures were also generated. COMPARISON:  None. FINDINGS: CT HEAD FINDINGS Brain: No acute intracranial hemorrhage, edema or midline shift. No large vascular territory infarct. No intra-axial mass nor extra-axial fluid. Minimal small vessel ischemic disease of periventricular white matter is identified. Midline fourth ventricle and basal cisterns without effacement. Vascular: Mild atherosclerosis of the carotid siphons. No hyperdense vessel sign. Skull: Intact bony calvarium. Other: Mild perinasal soft tissue swelling. CT MAXILLOFACIAL FINDINGS Osseous: Acute, slightly angulated and minimally displaced right nasal bone fracture with anterior nasal septal. Associated soft tissue swelling is seen. The remainder of the maxillofacial structures are intact. Orbits: Intact Sinuses: Complete opacification of the right maxillary sinus with bulging of the sinus walls suggesting a mucocele. No air-fluid levels are identified. Soft tissues: Mild posttraumatic perinasal soft tissue swelling. CT CERVICAL SPINE FINDINGS Alignment: Maintained cervical lordosis. Skull base and vertebrae: Intact Soft tissues and spinal canal: No prevertebral fluid or swelling. No visible canal hematoma. Disc  levels: Moderate to marked disc flattening C5 through C7. Uncovertebral joint osteoarthritis with uncinate spurring, the left at C4-5 and bilaterally at C5-6 and C6-7. Slight left-sided C4-5 foraminal encroachment from facet arthropathy and uncinate spurring and likewise on the right at C5-6 and C6-7. Multilevel degenerative facet arthropathy. Upper chest: No acute abnormality Other: None IMPRESSION: 1. No acute intracranial abnormality. Likely chronic minimal small vessel ischemic disease periventricular white matter. 2. Acute, slightly angulated and minimally displaced right nasal bone fracture with associated soft tissue swelling. Acute anterior nasal septal fracture without significant displacement. 3. Complete opacification of the right maxillary sinus with bulging the sinus walls compatible with a mucocele of the right maxillary sinus. 4. Cervical spondylosis without acute cervical spine fracture. Electronically Signed   By: Tollie Eth M.D.   On: 12/04/2018 21:21   Ct Cervical Spine Wo Contrast  Result Date: 12/04/2018 CLINICAL DATA:  Mechanical fall earlier today hitting face on the sidewalk. Abrasion to the nose. EXAM: CT HEAD WITHOUT CONTRAST CT MAXILLOFACIAL WITHOUT CONTRAST CT CERVICAL SPINE WITHOUT CONTRAST TECHNIQUE: Multidetector CT imaging of the head, cervical spine, and maxillofacial structures were performed using the standard protocol without intravenous contrast. Multiplanar CT image reconstructions of the cervical spine and maxillofacial structures were also generated. COMPARISON:  None. FINDINGS: CT HEAD FINDINGS Brain: No acute intracranial hemorrhage, edema or midline shift. No large vascular territory infarct. No intra-axial mass nor extra-axial fluid. Minimal small vessel ischemic disease of periventricular white matter is identified. Midline fourth ventricle and basal cisterns without effacement. Vascular: Mild atherosclerosis of the carotid siphons. No hyperdense vessel sign. Skull:  Intact bony calvarium. Other: Mild perinasal soft tissue swelling. CT MAXILLOFACIAL FINDINGS Osseous: Acute, slightly angulated and minimally displaced right nasal bone fracture with anterior nasal septal. Associated soft tissue swelling is seen. The remainder of  the maxillofacial structures are intact. Orbits: Intact Sinuses: Complete opacification of the right maxillary sinus with bulging of the sinus walls suggesting a mucocele. No air-fluid levels are identified. Soft tissues: Mild posttraumatic perinasal soft tissue swelling. CT CERVICAL SPINE FINDINGS Alignment: Maintained cervical lordosis. Skull base and vertebrae: Intact Soft tissues and spinal canal: No prevertebral fluid or swelling. No visible canal hematoma. Disc levels: Moderate to marked disc flattening C5 through C7. Uncovertebral joint osteoarthritis with uncinate spurring, the left at C4-5 and bilaterally at C5-6 and C6-7. Slight left-sided C4-5 foraminal encroachment from facet arthropathy and uncinate spurring and likewise on the right at C5-6 and C6-7. Multilevel degenerative facet arthropathy. Upper chest: No acute abnormality Other: None IMPRESSION: 1. No acute intracranial abnormality. Likely chronic minimal small vessel ischemic disease periventricular white matter. 2. Acute, slightly angulated and minimally displaced right nasal bone fracture with associated soft tissue swelling. Acute anterior nasal septal fracture without significant displacement. 3. Complete opacification of the right maxillary sinus with bulging the sinus walls compatible with a mucocele of the right maxillary sinus. 4. Cervical spondylosis without acute cervical spine fracture. Electronically Signed   By: Tollie Ethavid  Kwon M.D.   On: 12/04/2018 21:21   Ct Maxillofacial Wo Contrast  Result Date: 12/04/2018 CLINICAL DATA:  Mechanical fall earlier today hitting face on the sidewalk. Abrasion to the nose. EXAM: CT HEAD WITHOUT CONTRAST CT MAXILLOFACIAL WITHOUT CONTRAST CT  CERVICAL SPINE WITHOUT CONTRAST TECHNIQUE: Multidetector CT imaging of the head, cervical spine, and maxillofacial structures were performed using the standard protocol without intravenous contrast. Multiplanar CT image reconstructions of the cervical spine and maxillofacial structures were also generated. COMPARISON:  None. FINDINGS: CT HEAD FINDINGS Brain: No acute intracranial hemorrhage, edema or midline shift. No large vascular territory infarct. No intra-axial mass nor extra-axial fluid. Minimal small vessel ischemic disease of periventricular white matter is identified. Midline fourth ventricle and basal cisterns without effacement. Vascular: Mild atherosclerosis of the carotid siphons. No hyperdense vessel sign. Skull: Intact bony calvarium. Other: Mild perinasal soft tissue swelling. CT MAXILLOFACIAL FINDINGS Osseous: Acute, slightly angulated and minimally displaced right nasal bone fracture with anterior nasal septal. Associated soft tissue swelling is seen. The remainder of the maxillofacial structures are intact. Orbits: Intact Sinuses: Complete opacification of the right maxillary sinus with bulging of the sinus walls suggesting a mucocele. No air-fluid levels are identified. Soft tissues: Mild posttraumatic perinasal soft tissue swelling. CT CERVICAL SPINE FINDINGS Alignment: Maintained cervical lordosis. Skull base and vertebrae: Intact Soft tissues and spinal canal: No prevertebral fluid or swelling. No visible canal hematoma. Disc levels: Moderate to marked disc flattening C5 through C7. Uncovertebral joint osteoarthritis with uncinate spurring, the left at C4-5 and bilaterally at C5-6 and C6-7. Slight left-sided C4-5 foraminal encroachment from facet arthropathy and uncinate spurring and likewise on the right at C5-6 and C6-7. Multilevel degenerative facet arthropathy. Upper chest: No acute abnormality Other: None IMPRESSION: 1. No acute intracranial abnormality. Likely chronic minimal small  vessel ischemic disease periventricular white matter. 2. Acute, slightly angulated and minimally displaced right nasal bone fracture with associated soft tissue swelling. Acute anterior nasal septal fracture without significant displacement. 3. Complete opacification of the right maxillary sinus with bulging the sinus walls compatible with a mucocele of the right maxillary sinus. 4. Cervical spondylosis without acute cervical spine fracture. Electronically Signed   By: Tollie Ethavid  Kwon M.D.   On: 12/04/2018 21:21    Procedures Procedures (including critical care time)  Medications Ordered in ED Medications  Tdap (BOOSTRIX) injection  0.5 mL (has no administration in time range)     Initial Impression / Assessment and Plan / ED Course  I have reviewed the triage vital signs and the nursing notes.  Pertinent labs & imaging results that were available during my care of the patient were reviewed by me and considered in my medical decision making (see chart for details).     73 year old female with mechanical fall, I was only reviewed the patient's head CT, C-spine and maxillofacial CT and agree with the radiologic interpretation.  Maxillofacial CT does show depressed nasal fracture and septal fracture.  There is no evidence of septal hematoma on physical examination.  Though the patient does have bilateral raccoon's eyes she does not show any signs of entrapment.  There is also no evidence of basilar skull fracture including battle signs or hemotympanum.  Patient has an appropriate neurologic examination is and is alert and oriented.  Her wounds have been thoroughly cleaned prior to arrival and her tetanus vaccination updated.  Patient was given pain medication after review of her PDMP. Discussed outpatient follow-up with Dr. Lazarus Salines who is on-call for ENT.  Discussed home supportive care and pain management.  She appears appropriate for discharge at this time.  Seen and shared visit with Dr.  Preston Fleeting.  PDMP reviewed during this encounter.   Final Clinical Impressions(s) / ED Diagnoses   Final diagnoses:  Closed fracture of nasal bone, initial encounter  Open fracture of nasal septum, initial encounter  Facial abrasion, initial encounter    ED Discharge Orders    None       Arthor Captain, PA-C 12/05/18 0219    Dione Booze, MD 12/05/18 [phone]

## 2018-12-09 IMAGING — US US ABDOMEN LIMITED
1 series · 14 of 25 positions shown · non-contrast
Comparison: None.

CLINICAL DATA: Elevated LFTs

EXAM:
ULTRASOUND ABDOMEN LIMITED RIGHT UPPER QUADRANT

[Series 1: us abdomen limited · 0.26mm/px · 14 of 50 slices shown]
[im 1/50]
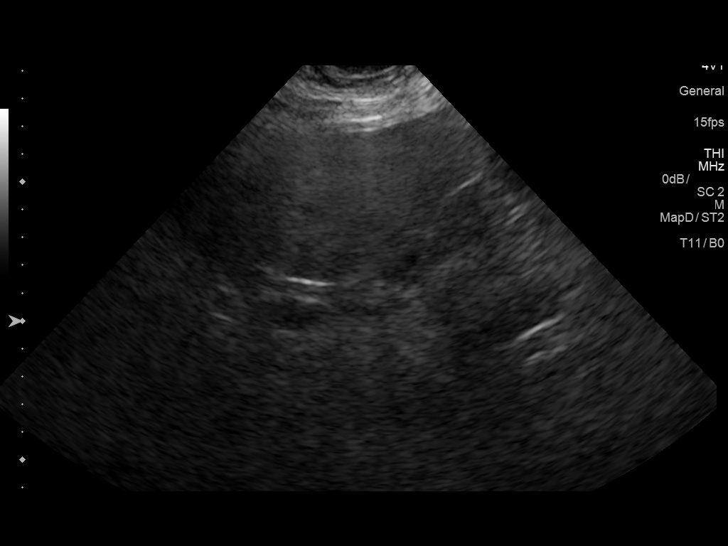
[im 5/50]
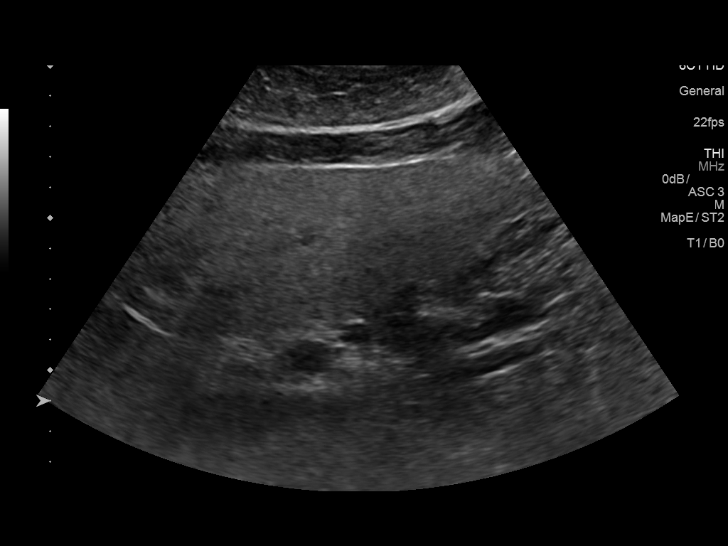
[im 9/50]
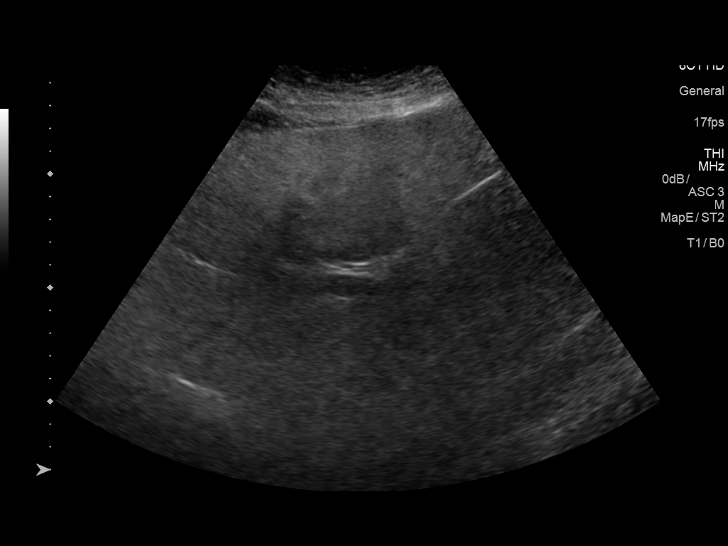
[im 13/50]
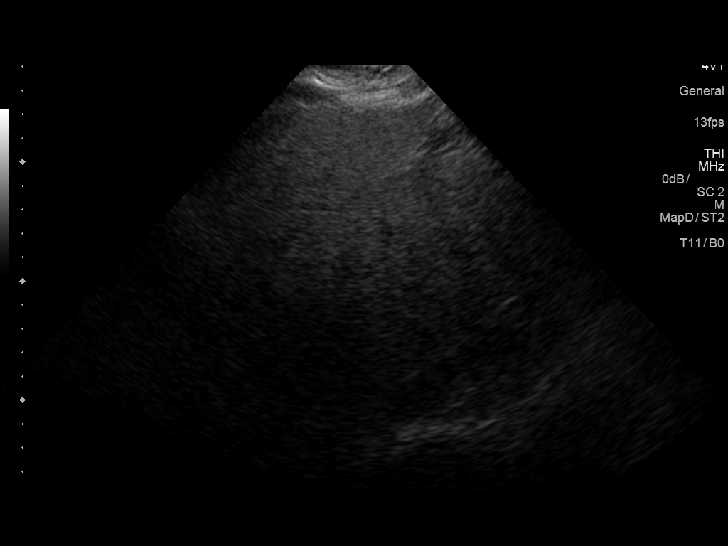
[im 17/50]
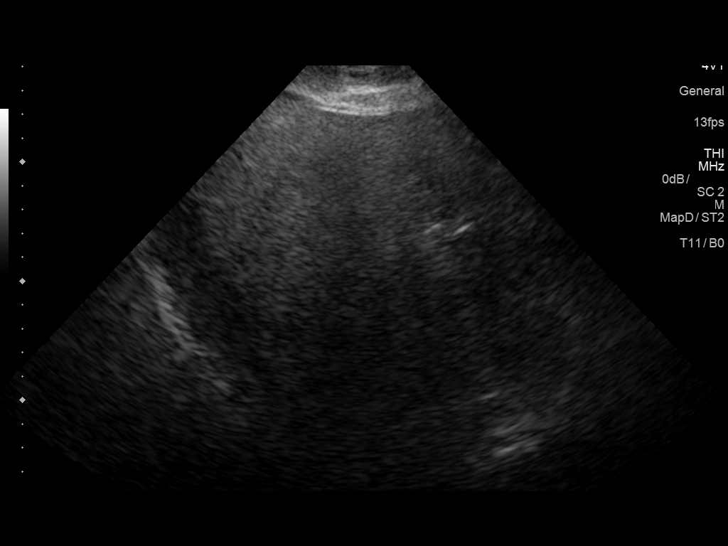
[im 19/50]
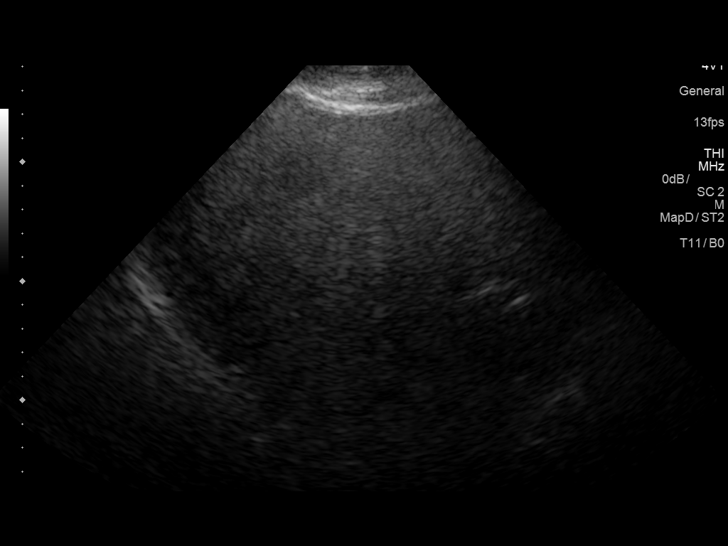
[im 23/50]
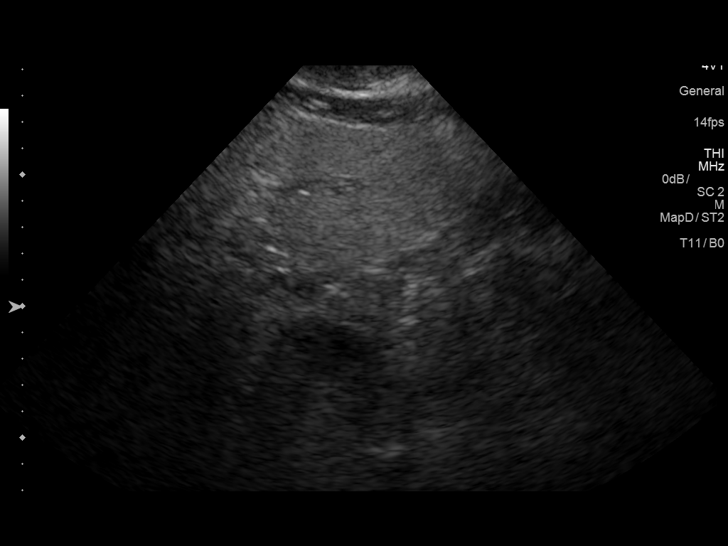
[im 27/50]
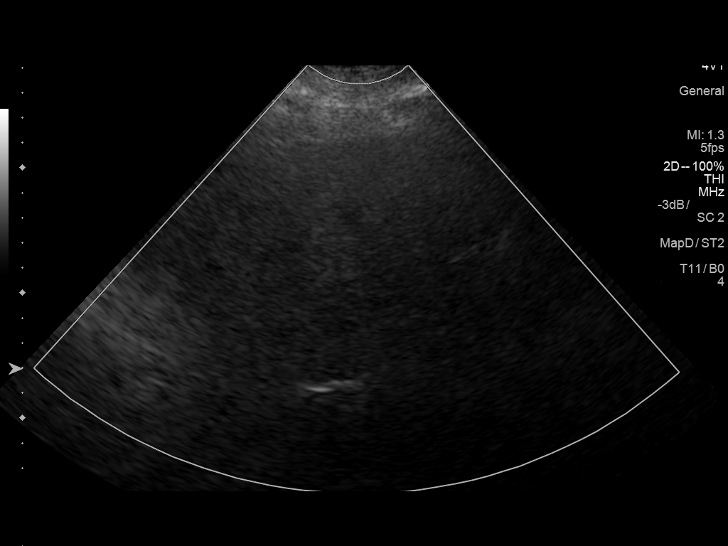
[im 31/50]
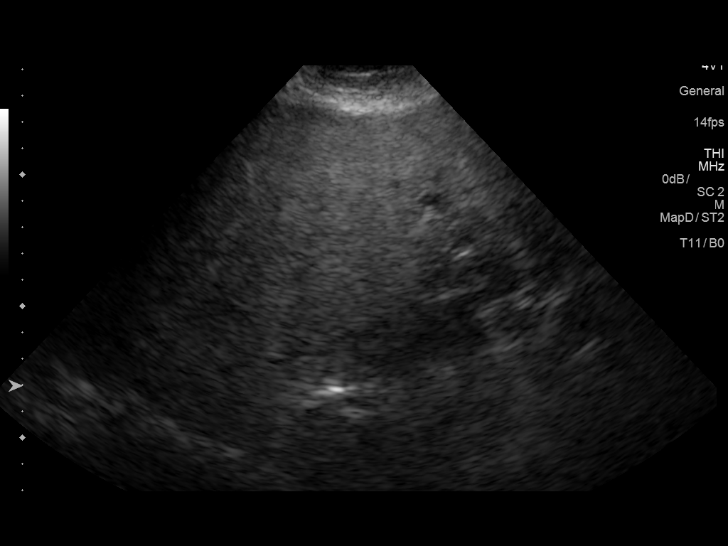
[im 33/50]
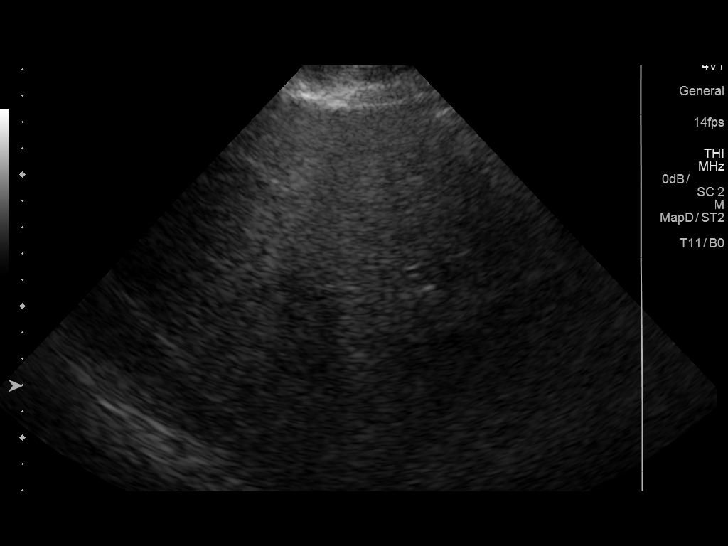
[im 37/50]
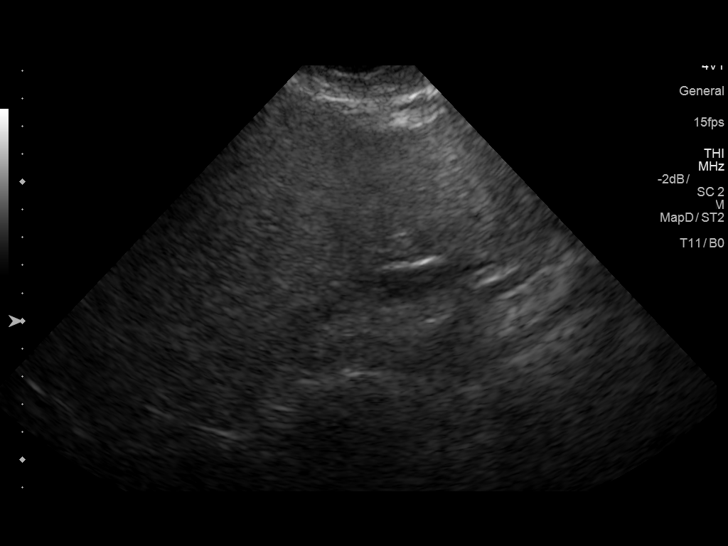
[im 41/50]
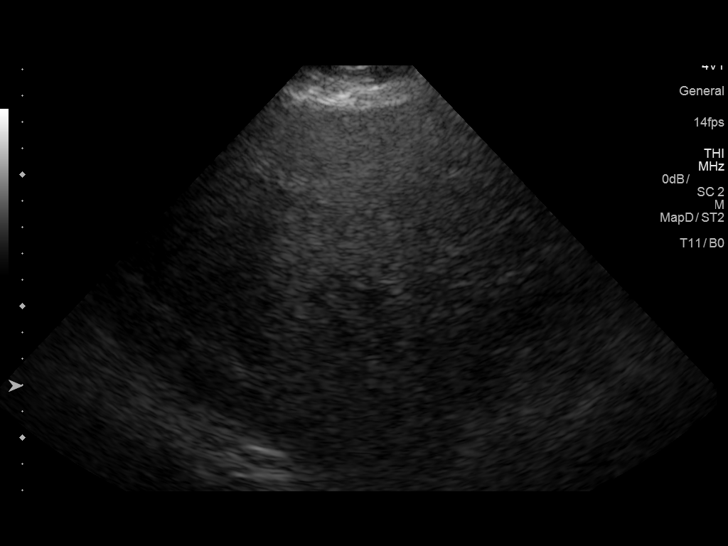
[im 45/50]
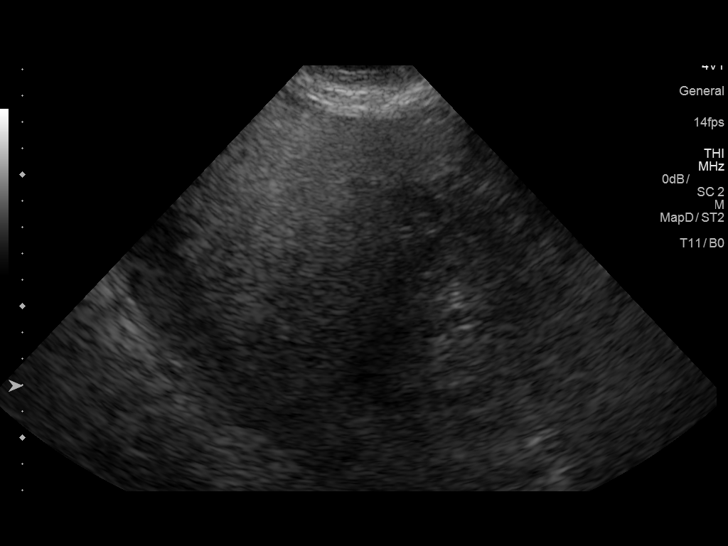
[im 50/50]
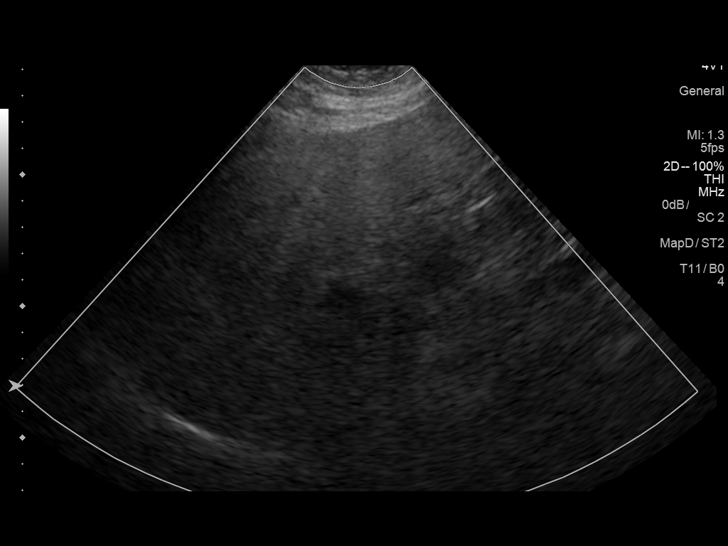

[14 of 25 positions shown; findings below may reference images not displayed]

FINDINGS: Gallbladder:

Surgically absent.

Common bile duct:

Diameter: 6.7 mm.

Liver:

Heterogeneous echogenicity. No focal mass. Portal vein is patent on
color Doppler imaging with normal direction of blood flow towards
the liver.
IMPRESSION: Limited study due to patient body habitus. Diffuse heterogeneous
echogenicity in the liver is nonspecific. No focal mass noted.
Previous cholecystectomy.

## 2018-12-13 ENCOUNTER — Ambulatory Visit: Payer: Medicare Other | Admitting: Cardiology

## 2018-12-20 ENCOUNTER — Other Ambulatory Visit: Payer: Self-pay | Admitting: Cardiology

## 2018-12-25 ENCOUNTER — Other Ambulatory Visit: Payer: Self-pay | Admitting: Family Medicine

## 2018-12-25 DIAGNOSIS — Z1231 Encounter for screening mammogram for malignant neoplasm of breast: Secondary | ICD-10-CM

## 2018-12-30 ENCOUNTER — Other Ambulatory Visit: Payer: Self-pay | Admitting: Sports Medicine

## 2018-12-30 DIAGNOSIS — M170 Bilateral primary osteoarthritis of knee: Secondary | ICD-10-CM

## 2019-01-10 ENCOUNTER — Ambulatory Visit: Payer: Medicare PPO | Admitting: Cardiology

## 2019-01-10 ENCOUNTER — Encounter: Payer: Self-pay | Admitting: Cardiology

## 2019-01-10 VITALS — BP 150/77 | HR 77 | Ht 66.0 in | Wt 250.8 lb

## 2019-01-10 DIAGNOSIS — I358 Other nonrheumatic aortic valve disorders: Secondary | ICD-10-CM | POA: Diagnosis not present

## 2019-01-10 DIAGNOSIS — I878 Other specified disorders of veins: Secondary | ICD-10-CM

## 2019-01-10 DIAGNOSIS — I5032 Chronic diastolic (congestive) heart failure: Secondary | ICD-10-CM | POA: Diagnosis not present

## 2019-01-10 DIAGNOSIS — Z01810 Encounter for preprocedural cardiovascular examination: Secondary | ICD-10-CM | POA: Diagnosis not present

## 2019-01-10 DIAGNOSIS — I10 Essential (primary) hypertension: Secondary | ICD-10-CM | POA: Diagnosis not present

## 2019-01-10 DIAGNOSIS — R0600 Dyspnea, unspecified: Secondary | ICD-10-CM

## 2019-01-10 DIAGNOSIS — R0609 Other forms of dyspnea: Secondary | ICD-10-CM

## 2019-01-10 HISTORY — DX: Other specified disorders of veins: I87.8

## 2019-01-10 MED ORDER — CARVEDILOL 6.25 MG PO TABS: 6.2500 mg | ORAL_TABLET | Freq: Two times a day (BID) | ORAL | 3 refills | Status: DC

## 2019-01-10 NOTE — Patient Instructions (Signed)
Medication Instructions:  MEDICATION REFILLED- CARVEDIOLOL  If you need a refill on your cardiac medications before your next appointment, please call your pharmacy.   Lab work: NOT NEEDED If you have labs (blood work) drawn today and your tests are completely normal, you will receive your results only by: Marland Kitchen MyChart Message (if you have MyChart) OR . A paper copy in the mail If you have any lab test that is abnormal or we need to change your treatment, we will call you to review the results.  Testing/Procedures: NOT NEEDED  Follow-Up: At [facility], you and your health needs are our priority.  As part of our continuing mission to provide you with exceptional heart care, we have created designated Provider Care Teams.  These Care Teams include your primary Cardiologist (physician) and Advanced Practice Providers (APPs -  Physician Assistants and Nurse Practitioners) who all work together to provide you with the care you need, when you need it. You will need a follow up appointment in 12 months FEB 2021.  Please call our office 2 months in advance to schedule this appointment.  You may see Bryan Lemma, MD or one of the following Advanced Practice Providers on your designated Care Team:   Theodore Demark, PA-C . Joni Reining, DNP, ANP  Any Other Special Instructions Will Be Listed Below (If Applicable).  YOU ARE CLEARED FROM A CARDIAC STANDPOINT TO HAVE NASAL SURGERY FOR THE NEXT 6 MONTHS LOK RISK  , BUT AVOID EXCESSIVE VOLUME INTAKE.

## 2019-01-10 NOTE — Progress Notes (Signed)
PCP: Thomasene Lot, DO  Clinic Note: Chief Complaint  Patient presents with  . Follow-up    No major/new complaints  . Pre-op Exam    Facial surgery  . Shortness of Breath    On exertion only.  No change  . Hypertension    Did not take home medications yet today    HPI: [name] is a 73 y.o. female with a PMH below who presents today for delayed annual follow-up and preop cardiovascular evaluation --> she fell and hit her face and has facial/nasal fractures that need repair (she plans to delay this surgery until she returns from an upcoming trip).  She was initially evaluated for exertional dyspnea and edema.  She has hypertension and hypothyroidism with chronic lower extremity edema for which she takes Lasix. Family history notable for hypertension in her father and nephrolithiasis in her children.  [name] was last seen on 10/25/2018 for pre-op CV Evaluation for oral surgery.  She still noted exertional dyspnea but no chest pain or pressure.  No PND orthopnea despite having mild edema. -She was able to have her dental surgery. She had a relatively normal echocardiogram with grade 2 diastolic dysfunction as well as a normal nonischemic Myoview.  (See PSH)  Recent Hospitalizations: n/a  Studies Personally Reviewed - (if available, images/films reviewed: From Epic Chart or Care Everywhere)  No recent studies.  Has had lower extremity Dopplers ordered -bilateral ABIs were normal.  Venous Dopplers (January 2019) showed large fluid-filled structure extending from the mid thigh to the proximal calf on the right.  No evidence of DVT.  Left side had no occlusion.  Interval History: [name] returns here today doing relatively well overall from a cardiac standpoint.  She really does not do much in the way of any walking.  She is been quite sedentary of late.  Most of this is related to poor balance and unsteady gait.  She had a recent fall where she fell flat on her face  broke some of her facial and nasal bones.  She has been having a lot of issues with vertigo as well.  She hopes to build to go on a trip to [place] before having her surgery though.  She does not walk much, and is quite deconditioned.  She does have exertional dyspnea but this is not new, and is not associated with any exertional chest tightness or pressure. No resting dyspnea.  No PND, orthopnea or edema.  She denies any rapid irregular heartbeats palpitations.  No syncope/near syncope or amaurosis fugax symptoms.  She does not walk enough to notice claudication..  She rarely takes an additional dose of her furosemide usually just taking 1/2 tablet.  Part of it is because she does not like to take a full tablet and have to rush to the bathroom.  Probably part of her balance issues and vertigo may be related to the fact that she self acknowledges to be a quite heavy alcohol drinker.  ROS: A comprehensive was performed. Review of Systems  Constitutional: Negative for malaise/fatigue (Not really malaise or fatigue, just sedentary) and weight loss.  HENT: Negative for congestion and nosebleeds.   Respiratory: Positive for shortness of breath (See HPI). Negative for cough and wheezing.   Gastrointestinal: Negative for blood in stool, heartburn and melena.  Genitourinary: Negative for hematuria.  Musculoskeletal: Positive for falls (See HPI) and joint pain (Mild arthritis pains).       Unsteady gait  Neurological: Positive for dizziness  and weakness (Legs seem weak.  Partially because of deconditioning.). Negative for focal weakness.       Poor balance  Psychiatric/Behavioral: Negative for depression and memory loss. The patient is not nervous/anxious and does not have insomnia.   All other systems reviewed and are negative.    I have reviewed and (if needed) personally updated the patient's problem list, medications, allergies, past medical and surgical history, social and family history.    Past Medical History:  Diagnosis Date  . Alcohol abuse 04/25/2015  . Allergy   . Anastomotic ulcer S/P gastric bypass 06/18/2016  . Arthritis   . Cataract   . Degenerative disc disease, cervical   . GAD (generalized anxiety disorder) 06/14/2016  . HTN (hypertension) 04/25/2015  . Hypertension   . Hypothyroidism   . 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 Substance abuse (HCC)    Alcohol abuse 2016  . UGIB (upper gastrointestinal bleed) 04/25/2015    Past Surgical History:  Procedure Laterality Date  . CHOLECYSTECTOMY    . COSMETIC SURGERY    . DIAGNOSTIC LAPAROSCOPY    . ESOPHAGOGASTRODUODENOSCOPY N/A 04/25/2015   Procedure: ESOPHAGOGASTRODUODENOSCOPY (EGD);  Surgeon: Beverley Fiedler, MD;  Location: [facility] ENDOSCOPY;  Service: Endoscopy;  Laterality: N/A;  . FRACTURE SURGERY    . GASTRIC BYPASS    . NM MYOVIEW LTD  10/10/2017   Normal LV size and function EF 72%.  Medium sized mild severity defect in the apical septal apical lateral and apical wall suggestive of breast attenuation.  LOW RISK.  No ischemia or infarction.    . OPEN REDUCTION INTERNAL FIXATION (ORIF) DISTAL RADIAL FRACTURE Right 01/15/2013   Procedure: OPEN REDUCTION INTERNAL FIXATION (ORIF) Right DISTAL RADIUS FRACTURE;  Surgeon: Eldred Manges, MD;  Location: [facility] OR;  Service: Orthopedics;  Laterality: Right;  . TRANSTHORACIC ECHOCARDIOGRAM  10/04/2017   Mild LVH.  EF 60-65%.  Grade 2/moderate diastolic dysfunction.  Mild pulmonary hypertension.  No valve lesions    2D echo October 04, 2017: Mild LVH.  EF 60-65%.  Grade 2/moderate diastolic dysfunction.  Mild pulmonary hypertension.  No valve lesions  Myoview stress test October 10, 2017: Normal LV size and function EF 72%.  Medium sized mild severity defect in the apical septal apical lateral and apical wall suggestive of breast attenuation.  LOW RISK.  No ischemia or infarction.   Current Meds  Medication Sig  .  acetaminophen (TYLENOL) 500 MG tablet Take 1,000 mg by mouth daily.   Marland Kitchen atorvastatin (LIPITOR) 80 MG tablet Take 1 tablet (80 mg total) by mouth at bedtime.  . benazepril-hydrochlorthiazide (LOTENSIN HCT) 20-25 MG tablet TAKE 1 TABLET BY MOUTH DAILY.  . carvedilol (COREG) 6.25 MG tablet Take 1 tablet (6.25 mg total) by mouth 2 (two) times daily with a meal.  . Cholecalciferol (VITAMIN D3) 5000 units TABS 5,000 IU OTC vitamin D3 daily. (Patient taking differently: Take 15,000 Units by mouth daily. 5,000 IU OTC vitamin D3 daily.)  . cyanocobalamin (,VITAMIN B-12,) 1000 MCG/ML injection INJECT 1 ML INTO THE MUSCLE EVERY 30 DAYS.  Marland Kitchen cyclobenzaprine (FLEXERIL) 10 MG tablet TAKE 1 TABLET BY MOUTH 3 TIMES DAILY AS NEEDED FOR MUSCLE SPASMS.  Marland Kitchen diphenhydrAMINE (BENADRYL) 25 MG tablet Take 25 mg by mouth at bedtime as needed.   . ferrous sulfate 325 (65 FE) MG tablet Take 325 mg by mouth daily with breakfast.  . furosemide (LASIX) 40 MG tablet Take 0.5 tablets (20 mg  total) by mouth daily.  Marland Kitchen gabapentin (NEURONTIN) 300 MG capsule TAKE 3 CAPSULES (900 MG TOTAL) BY MOUTH 3 (THREE) TIMES DAILY.  Marland Kitchen levothyroxine (SYNTHROID, LEVOTHROID) 150 MCG tablet TAKE 1 TABLET BY MOUTH DAILY.  Marland Kitchen Liraglutide -Weight Management (SAXENDA) 18 MG/3ML SOPN Inject 3 mg into the skin daily. 0.6 mg inj subcut daily for 1 week, then incr by 0.6 mg weekly until reaching 3 mg injected subcut daily  . magnesium 30 MG tablet Take 30 mg by mouth 2 (two) times a week. Patient takes prn and takes 1 gram  . Melatonin 5 MG CAPS Take 10 mg by mouth at bedtime.   . meloxicam (MOBIC) 15 MG tablet Take 1 tablet (15 mg total) by mouth daily.  . Multiple Vitamin (MULTIVITAMIN) tablet Take 1 tablet by mouth daily.  . mupirocin ointment (BACTROBAN) 2 % Apply to affected wounds twice daily.  . pantoprazole (PROTONIX) 40 MG tablet TAKE 1 TABLET BY MOUTH DAILY.  . traMADol (ULTRAM) 50 MG tablet TAKE 1 TABLET BY MOUTH 2 TIMES DAILY. MAXIMUM 6 TABS PER  DAY.  . [DISCONTINUED] carvedilol (COREG) 6.25 MG tablet Take 1 tablet (6.25 mg total) by mouth 2 (two) times daily with a meal. KEEP OV.    Allergies  Allergen Reactions  . Cocoa Anaphylaxis  . Red Dye Hives    Rash and Nausea diarrhea  . Lyrica [Pregabalin] Other (See Comments)    Abnormal bleeding  . Sulfa Antibiotics Rash    Social History   Tobacco Use  . Smoking status: Never Smoker  . Smokeless tobacco: Never Used  Substance Use Topics  . Alcohol use: Yes    Alcohol/week: 28.0 standard drinks    Types: 28 Glasses of wine per week    Comment: drinks daily and has a bottle of wine per day  . Drug use: No   Social History   Social History Narrative   Marital status: married      Employment: ICU Sales executive - retired      Alcohol: drinks bottle of wine daily in 2016   Takes care of her 35 y/o mom at home    family history includes Breast cancer in her sister; Breast cancer (age of onset: 56) in her mother; COPD in her father; Cancer in her father and mother; Cancer (age of onset: 57) in her cousin; Diabetes in her father; Heart disease in her mother; Hypertension in her father and mother; Mental illness in her maternal grandmother; Stroke in her maternal grandmother; Stroke (age of onset: 74) in her mother.  Wt Readings from Last 3 Encounters:  01/10/19 250 lb 12.8 oz (113.8 kg)  11/04/18 250 lb (113.4 kg)  09/09/18 249 lb (112.9 kg)    PHYSICAL EXAM BP (!) 150/77   Pulse 77   Ht 5\' 6"  (1.676 m)   Wt 250 lb 12.8 oz (113.8 kg)   BMI 40.48 kg/m   --Did not take her blood pressure medication this morning nor did she take her Lasix because she was going out. Physical Exam  Constitutional: She is oriented to person, place, and time. She appears well-developed. No distress.  Morbidly obese.  Somewhat chronically ill-appearing.  Nontoxic  HENT:  Head: Normocephalic and atraumatic.  Neck: Normal range of motion. Neck supple. No hepatojugular reflux and no JVD  present. Carotid bruit is not present.  Cardiovascular: Normal rate, regular rhythm, S1 normal and S2 normal.  Occasional extrasystoles are present. PMI is not displaced (Unable to palpate due to  body habitus). Exam reveals distant heart sounds and decreased pulses (Mildly decreased pedal pulses, probably because of mild edema.). Exam reveals no gallop and no friction rub.  Murmur (Soft 1/6 SEM at RUSB.  Crescendo decrescendo) heard. Pulmonary/Chest: Effort normal and breath sounds normal. No respiratory distress. She has no wheezes. She has no rales.  Abdominal: Soft. Bowel sounds are normal. She exhibits no distension. There is no abdominal tenderness.  Obesity.  No HSM  Musculoskeletal:        General: Edema (Bilateral LE 1-2+ edema) present.  Lymphadenopathy:    She has no cervical adenopathy.  Neurological: She is alert and oriented to person, place, and time.  Skin: Skin is warm and dry.  Deep purple venous stasis discoloration of bilateral lower extremities.  Blanching noted.  Not currently wearing her compression stockings.  Psychiatric: She has a normal mood and affect. Her behavior is normal. Judgment and thought content normal.  Vitals reviewed.    Adult ECG Report  Rate: 77 ;  Rhythm: normal sinus rhythm and Borderline interventricular conduction delay.  T wave inversion only in lead III.  Otherwise normal ST-T wave segments.  Normal intervals durations.  Normal axis;   Narrative Interpretation: Stable EKG   Other studies Reviewed: Additional studies/ records that were reviewed today include:  Recent Labs:   Lab Results  Component Value Date   CHOL 209 (H) 03/11/2018   HDL 64 03/11/2018   LDLCALC 117 (H) 03/11/2018   TRIG 140 03/11/2018   CHOLHDL 3.3 03/11/2018   Lab Results  Component Value Date   CREATININE 0.86 09/25/2018   BUN 11 09/25/2018   NA 139 09/25/2018   K 4.1 09/25/2018   CL 100 09/25/2018   CO2 27 09/25/2018   ASSESSMENT / PLAN: Problem List Items  Addressed This Visit    Aortic systolic murmur on examination    Aortic sclerosis with probably some mild subvalvular turbulence from hypertrophy.  Not aortic stenosis.  Should not require further evaluation preoperatively.      Diastolic dysfunction with chronic heart failure (HCC) - Primary (Chronic)    Likely hypertensive heart disease.  Relatively euvolemic on exam.  The edema is chronic and venous stasis related.  She usually wears support stockings. Has not use additional Lasix.  Recommendations perioperatively will be simply to avoid excess volume.  Continue carvedilol at current dose (has had blood pressure as low as 109/76 at PCP office).  Along with combination ACE inhibitor-HCTZ.      Relevant Medications   carvedilol (COREG) 6.25 MG tablet   Other Relevant Orders   EKG 12-Lead   Exertional dyspnea    At baseline.  Had similar symptoms a year ago evaluated with a Myoview that was nonischemic.  Echocardiogram showed only grade 2 diastolic dysfunction  Probably a lot of this is related to obesity and deconditioning.      Relevant Orders   EKG 12-Lead   Hypertension (Chronic)    Blood pressure seems, labile and very responsive to her medications.  Usually is much better than it is today.  With her having diastolic dysfunction (grade 2), blood pressure not low reduction is very important.  She is on carvedilol which will refill along with benazepril.  The HCTZ helps with diuresis along with her Lasix.       Relevant Medications   carvedilol (COREG) 6.25 MG tablet   Morbid obesity (HCC) (Chronic)    Working on diet modification.  Unfortunately she is so sedentary that she is  not able to lose weight that way.  I think she would potentially benefit from water aerobics      Preoperative cardiovascular examination    Facial surgery, although it seems to be high risk overall is relatively low risk from cardiac standpoint. No change in symptoms.  No active anginal symptoms.   Stable diastolic dysfunction related mild heart failure symptoms.  No requirement for additional cardiovascular evaluation at this time.  She is already on stable regimen with beta-blocker which reduces cardiac risk. Would avoid volume excess.  She will be considered LOW RISK PATIENT (FROM A CARDIOVASCULAR STANDPOINT) FOR LOW RISK SURGERY.  No further evaluation needed.      Relevant Orders   EKG 12-Lead   Venous stasis of both lower extremities    Recommend continued support stockings and PRN Lasix.  Foot elevation.      Relevant Medications   carvedilol (COREG) 6.25 MG tablet      I spent a total of 25 minutes with the patient and chart review. >  50% of the time was spent in direct patient consultation.   Current medicines are reviewed at length with the patient today.  (+/- concerns) n/a The following changes have been made:  n/a  Patient Instructions  Medication Instructions:  MEDICATION REFILLED- CARVEDIOLOL  If you need a refill on your cardiac medications before your next appointment, please call your pharmacy.   Lab work: NOT NEEDED If you have labs (blood work) drawn today and your tests are completely normal, you will receive your results only by: Marland Kitchen. MyChart Message (if you have MyChart) OR . A paper copy in the mail If you have any lab test that is abnormal or we need to change your treatment, we will call you to review the results.  Testing/Procedures: NOT NEEDED  Follow-Up: At [facility], you and your health needs are our priority.  As part of our continuing mission to provide you with exceptional heart care, we have created designated Provider Care Teams.  These Care Teams include your primary Cardiologist (physician) and Advanced Practice Providers (APPs -  Physician Assistants and Nurse Practitioners) who all work together to provide you with the care you need, when you need it. You will need a follow up appointment in 12 months FEB 2021.  Please call  our office 2 months in advance to schedule this appointment.  You may see Bryan Lemmaavid Harding, MD or one of the following Advanced Practice Providers on your designated Care Team:   Theodore DemarkRhonda Barrett, PA-C . Joni ReiningKathryn Lawrence, DNP, ANP  Any Other Special Instructions Will Be Listed Below (If Applicable).  YOU ARE CLEARED FROM A CARDIAC STANDPOINT TO HAVE NASAL SURGERY FOR THE NEXT 6 MONTHS LOK RISK  , BUT AVOID EXCESSIVE VOLUME INTAKE.    Studies Ordered:   Orders Placed This Encounter  Procedures  . EKG 12-Lead      Bryan Lemmaavid Harding, M.D., M.S. Interventional Cardiologist   Pager # [phone] Phone # [phone] [address] [place], [place] [address]   Thank you for choosing [facility] at [facility]!!

## 2019-01-12 ENCOUNTER — Encounter: Payer: Self-pay | Admitting: Cardiology

## 2019-01-12 NOTE — Assessment & Plan Note (Signed)
Likely hypertensive heart disease.  Relatively euvolemic on exam.  The edema is chronic and venous stasis related.  She usually wears support stockings. Has not use additional Lasix.  Recommendations perioperatively will be simply to avoid excess volume.  Continue carvedilol at current dose (has had blood pressure as low as 109/76 at PCP office).  Along with combination ACE inhibitor-HCTZ.

## 2019-01-12 NOTE — Assessment & Plan Note (Signed)
Blood pressure seems, labile and very responsive to her medications.  Usually is much better than it is today.  With her having diastolic dysfunction (grade 2), blood pressure not low reduction is very important.  She is on carvedilol which will refill along with benazepril.  The HCTZ helps with diuresis along with her Lasix.

## 2019-01-12 NOTE — Assessment & Plan Note (Signed)
At baseline.  Had similar symptoms a year ago evaluated with a Myoview that was nonischemic.  Echocardiogram showed only grade 2 diastolic dysfunction  Probably a lot of this is related to obesity and deconditioning.

## 2019-01-12 NOTE — Assessment & Plan Note (Signed)
Aortic sclerosis with probably some mild subvalvular turbulence from hypertrophy.  Not aortic stenosis.  Should not require further evaluation preoperatively.

## 2019-01-12 NOTE — Assessment & Plan Note (Signed)
Working on diet modification.  Unfortunately she is so sedentary that she is not able to lose weight that way.  I think she would potentially benefit from water aerobics

## 2019-01-12 NOTE — Assessment & Plan Note (Signed)
>>  ASSESSMENT AND PLAN FOR VENOUS STASIS OF BOTH LOWER EXTREMITIES WRITTEN ON 01/12/2019 11:41 PM BY HARDING, DAVID W, MD  Recommend continued support stockings and PRN Lasix.  Foot elevation.

## 2019-01-12 NOTE — Assessment & Plan Note (Signed)
Facial surgery, although it seems to be high risk overall is relatively low risk from cardiac standpoint. No change in symptoms.  No active anginal symptoms.  Stable diastolic dysfunction related mild heart failure symptoms.  No requirement for additional cardiovascular evaluation at this time.  She is already on stable regimen with beta-blocker which reduces cardiac risk. Would avoid volume excess.  She will be considered LOW RISK PATIENT (FROM A CARDIOVASCULAR STANDPOINT) FOR LOW RISK SURGERY.  No further evaluation needed.

## 2019-01-12 NOTE — Assessment & Plan Note (Signed)
Recommend continued support stockings and PRN Lasix.  Foot elevation.

## 2019-01-13 ENCOUNTER — Encounter: Payer: Self-pay | Admitting: Family Medicine

## 2019-01-13 ENCOUNTER — Ambulatory Visit (INDEPENDENT_AMBULATORY_CARE_PROVIDER_SITE_OTHER): Payer: Medicare PPO | Admitting: Family Medicine

## 2019-01-13 VITALS — BP 138/80 | HR 66 | Ht 65.0 in | Wt 240.5 lb

## 2019-01-13 DIAGNOSIS — Z0000 Encounter for general adult medical examination without abnormal findings: Secondary | ICD-10-CM | POA: Diagnosis not present

## 2019-01-13 DIAGNOSIS — Z23 Encounter for immunization: Secondary | ICD-10-CM

## 2019-01-13 DIAGNOSIS — Z1211 Encounter for screening for malignant neoplasm of colon: Secondary | ICD-10-CM

## 2019-01-13 MED ORDER — PNEUMOCOCCAL 13-VAL CONJ VACC IM SUSP: 0.5000 mL | INTRAMUSCULAR | 0 refills | Status: AC

## 2019-01-13 MED ORDER — ZOSTER VAC RECOMB ADJUVANTED 50 MCG/0.5ML IM SUSR: 0.5000 mL | Freq: Once | INTRAMUSCULAR | 0 refills | Status: AC

## 2019-01-13 NOTE — Patient Instructions (Addendum)
[name]:  Needs colonoscopy done- pt rpefers Dr Hilarie Fredrickson who did her EGD in 2016. - needs shingrix and PNA vaccinces.        Your goal blood pressure should be 130/80 or less on a regular basis, or medications should be started/ modified.    Normal blood pressure is less than 120/80.    Hypertension Hypertension, commonly called high blood pressure, is when the force of blood pumping through the arteries is too strong. The arteries are the blood vessels that carry blood from the heart throughout the body. Hypertension forces the heart to work harder to pump blood and may cause arteries to become narrow or stiff. Having untreated or uncontrolled hypertension can cause heart attacks, strokes, kidney disease, and other problems. A blood pressure reading consists of a higher number over a lower number. Ideally, your blood pressure should be below 120/80. The first ("top") number is called the systolic pressure. It is a measure of the pressure in your arteries as your heart beats. The second ("bottom") number is called the diastolic pressure. It is a measure of the pressure in your arteries as the heart relaxes. What are the causes? The cause of this condition is not known. What increases the risk? Some risk factors for high blood pressure are under your control. Others are not. Factors you can change  Smoking.  Having type 2 diabetes mellitus, high cholesterol, or both.  Not getting enough exercise or physical activity.  Being overweight.  Having too much fat, sugar, calories, or salt (sodium) in your diet.  Drinking too much alcohol. Factors that are difficult or impossible to change  Having chronic kidney disease.  Having a family history of high blood pressure.  Age. Risk increases with age.  Race. You may be at higher risk if you are African-American.  Gender. Men are at higher risk than women before age 68. After age 44, women are at higher risk than men.  Having  obstructive sleep apnea.  Stress. What are the signs or symptoms? Extremely high blood pressure (hypertensive crisis) may cause:  Headache.  Anxiety.  Shortness of breath.  Nosebleed.  Nausea and vomiting.  Severe chest pain.  Jerky movements you cannot control (seizures).  How is this diagnosed? This condition is diagnosed by measuring your blood pressure while you are seated, with your arm resting on a surface. The cuff of the blood pressure monitor will be placed directly against the skin of your upper arm at the level of your heart. It should be measured at least twice using the same arm. Certain conditions can cause a difference in blood pressure between your right and left arms. Certain factors can cause blood pressure readings to be lower or higher than normal (elevated) for a short period of time:  When your blood pressure is higher when you are in a health care provider's office than when you are at home, this is called white coat hypertension. Most people with this condition do not need medicines.  When your blood pressure is higher at home than when you are in a health care provider's office, this is called masked hypertension. Most people with this condition may need medicines to control blood pressure.  If you have a high blood pressure reading during one visit or you have normal blood pressure with other risk factors:  You may be asked to return on a different day to have your blood pressure checked again.  You may be asked to monitor your blood pressure at  home for 1 week or longer.  If you are diagnosed with hypertension, you may have other blood or imaging tests to help your health care provider understand your overall risk for other conditions. How is this treated? This condition is treated by making healthy lifestyle changes, such as eating healthy foods, exercising more, and reducing your alcohol intake. Your health care provider may prescribe medicine if  lifestyle changes are not enough to get your blood pressure under control, and if:  Your systolic blood pressure is above 130.  Your diastolic blood pressure is above 80.  Your personal target blood pressure may vary depending on your medical conditions, your age, and other factors. Follow these instructions at home: Eating and drinking  Eat a diet that is high in fiber and potassium, and low in sodium, added sugar, and fat. An example eating plan is called the DASH (Dietary Approaches to Stop Hypertension) diet. To eat this way: ? Eat plenty of fresh fruits and vegetables. Try to fill half of your plate at each meal with fruits and vegetables. ? Eat whole grains, such as whole wheat pasta, brown rice, or whole grain bread. Fill about one quarter of your plate with whole grains. ? Eat or drink low-fat dairy products, such as skim milk or low-fat yogurt. ? Avoid fatty cuts of meat, processed or cured meats, and poultry with skin. Fill about one quarter of your plate with lean proteins, such as fish, chicken without skin, beans, eggs, and tofu. ? Avoid premade and processed foods. These tend to be higher in sodium, added sugar, and fat.  Reduce your daily sodium intake. Most people with hypertension should eat less than 1,500 mg of sodium a day.  Limit alcohol intake to no more than 1 drink a day for nonpregnant women and 2 drinks a day for men. One drink equals 12 oz of beer, 5 oz of wine, or 1 oz of hard liquor. Lifestyle  Work with your health care provider to maintain a healthy body weight or to lose weight. Ask what an ideal weight is for you.  Get at least 30 minutes of exercise that causes your heart to beat faster (aerobic exercise) most days of the week. Activities may include walking, swimming, or biking.  Include exercise to strengthen your muscles (resistance exercise), such as pilates or lifting weights, as part of your weekly exercise routine. Try to do these types of exercises  for 30 minutes at least 3 days a week.  Do not use any products that contain nicotine or tobacco, such as cigarettes and e-cigarettes. If you need help quitting, ask your health care provider.  Monitor your blood pressure at home as told by your health care provider.  Keep all follow-up visits as told by your health care provider. This is important. Medicines  Take over-the-counter and prescription medicines only as told by your health care provider. Follow directions carefully. Blood pressure medicines must be taken as prescribed.  Do not skip doses of blood pressure medicine. Doing this puts you at risk for problems and can make the medicine less effective.  Ask your health care provider about side effects or reactions to medicines that you should watch for. Contact a health care provider if:  You think you are having a reaction to a medicine you are taking.  You have headaches that keep coming back (recurring).  You feel dizzy.  You have swelling in your ankles.  You have trouble with your vision. Get help right away  if:  You develop a severe headache or confusion.  You have unusual weakness or numbness.  You feel faint.  You have severe pain in your chest or abdomen.  You vomit repeatedly.  You have trouble breathing. Summary  Hypertension is when the force of blood pumping through your arteries is too strong. If this condition is not controlled, it may put you at risk for serious complications.  Your personal target blood pressure may vary depending on your medical conditions, your age, and other factors. For most people, a normal blood pressure is less than 120/80.  Hypertension is treated with lifestyle changes, medicines, or a combination of both. Lifestyle changes include weight loss, eating a healthy, low-sodium diet, exercising more, and limiting alcohol. This information is not intended to replace advice given to you by your health care provider. Make sure you  discuss any questions you have with your health care provider. Document Released: 11/13/2005 Document Revised: 10/11/2016 Document Reviewed: 10/11/2016 Elsevier Interactive Patient Education  2018 [employer].    How to Take Your Blood Pressure   Blood pressure is a measurement of how strongly your blood is pressing against the walls of your arteries. Arteries are blood vessels that carry blood from your heart throughout your body. Your health care provider takes your blood pressure at each office visit. You can also take your own blood pressure at home with a blood pressure machine. You may need to take your own blood pressure:  To confirm a diagnosis of high blood pressure (hypertension).  To monitor your blood pressure over time.  To make sure your blood pressure medicine is working.  Supplies needed: To take your blood pressure, you will need a blood pressure machine. You can buy a blood pressure machine, or blood pressure monitor, at most drugstores or online. There are several types of home blood pressure monitors. When choosing one, consider the following:  Choose a monitor that has an arm cuff.  Choose a monitor that wraps snugly around your upper arm. You should be able to fit only one finger between your arm and the cuff.  Do not choose a monitor that measures your blood pressure from your wrist or finger.  Your health care provider can suggest a reliable monitor that will meet your needs. How to prepare To get the most accurate reading, avoid the following for 30 minutes before you check your blood pressure:  Drinking caffeine.  Drinking alcohol.  Eating.  Smoking.  Exercising.  Five minutes before you check your blood pressure:  Empty your bladder.  Sit quietly without talking in a dining chair, rather than in a soft couch or armchair.  How to take your blood pressure To check your blood pressure, follow the instructions in the manual that came with your  blood pressure monitor. If you have a digital blood pressure monitor, the instructions may be as follows: 1. Sit up straight. 2. Place your feet on the floor. Do not cross your ankles or legs. 3. Rest your left arm at the level of your heart on a table or desk or on the arm of a chair. 4. Pull up your shirt sleeve. 5. Wrap the blood pressure cuff around the upper part of your left arm, 1 inch (2.5 cm) above your elbow. It is best to wrap the cuff around bare skin. 6. Fit the cuff snugly around your arm. You should be able to place only one finger between the cuff and your arm. 7. Position the cord  inside the groove of your elbow. 8. Press the power button. 9. Sit quietly while the cuff inflates and deflates. 10. Read the digital reading on the monitor screen and write it down (record it). 11. Wait 2-3 minutes, then repeat the steps, starting at step 1.  What does my blood pressure reading mean? A blood pressure reading consists of a higher number over a lower number. Ideally, your blood pressure should be below 120/80. The first ("top") number is called the systolic pressure. It is a measure of the pressure in your arteries as your heart beats. The second ("bottom") number is called the diastolic pressure. It is a measure of the pressure in your arteries as the heart relaxes. Blood pressure is classified into four stages. The following are the stages for adults who do not have a short-term serious illness or a chronic condition. Systolic pressure and diastolic pressure are measured in a unit called mm Hg. Normal  Systolic pressure: below 409.  Diastolic pressure: below 80. Elevated  Systolic pressure: 811-914.  Diastolic pressure: below 80. Hypertension stage 1  Systolic pressure: 782-956.  Diastolic pressure: 21-30. Hypertension stage 2  Systolic pressure: 865 or above.  Diastolic pressure: 90 or above. You can have prehypertension or hypertension even if only the systolic or  only the diastolic number in your reading is higher than normal. Follow these instructions at home:  Check your blood pressure as often as recommended by your health care provider.  Take your monitor to the next appointment with your health care provider to make sure: ? That you are using it correctly. ? That it provides accurate readings.  Be sure you understand what your goal blood pressure numbers are.  Tell your health care provider if you are having any side effects from blood pressure medicine. Contact a health care provider if:  Your blood pressure is consistently high. Get help right away if:  Your systolic blood pressure is higher than 180.  Your diastolic blood pressure is higher than 110. This information is not intended to replace advice given to you by your health care provider. Make sure you discuss any questions you have with your health care provider. Document Released: 04/21/2016 Document Revised: 07/04/2016 Document Reviewed: 04/21/2016 Elsevier Interactive Patient Education  2018 [place] for Adults, Female  A healthy lifestyle and preventive care can promote health and wellness. Preventive health guidelines for women include the following key practices.   A routine yearly physical is a good way to check with your health care provider about your health and preventive screening. It is a chance to share any concerns and updates on your health and to receive a thorough exam.   Visit your dentist for a routine exam and preventive care every 6 months. Brush your teeth twice a day and floss once a day. Good oral hygiene prevents tooth decay and gum disease.   The frequency of eye exams is based on your age, health, family medical history, use of contact lenses, and other factors. Follow your health care provider's recommendations for frequency of eye exams.   Eat a healthy diet. Foods like vegetables, fruits, whole grains,  low-fat dairy products, and lean protein foods contain the nutrients you need without too many calories. Decrease your intake of foods high in solid fats, added sugars, and salt. Eat the right amount of calories for you.Get information about a proper diet from your health care provider,  if necessary.   Regular physical exercise is one of the most important things you can do for your health. Most adults should get at least 150 minutes of moderate-intensity exercise (any activity that increases your heart rate and causes you to sweat) each week. In addition, most adults need muscle-strengthening exercises on 2 or more days a week.   Maintain a healthy weight. The body mass index (BMI) is a screening tool to identify possible weight problems. It provides an estimate of body fat based on height and weight. Your health care provider can find your BMI, and can help you achieve or maintain a healthy weight.For adults 20 years and older:   - A BMI below 18.5 is considered underweight.   - A BMI of 18.5 to 24.9 is normal.   - A BMI of 25 to 29.9 is considered overweight.   - A BMI of 30 and above is considered obese.   Maintain normal blood lipids and cholesterol levels by exercising and minimizing your intake of trans and saturated fats.  Eat a balanced diet with plenty of fruit and vegetables. Blood tests for lipids and cholesterol should begin at age 64 and be repeated every 5 years minimum.  If your lipid or cholesterol levels are high, you are over 40, or you are at high risk for heart disease, you may need your cholesterol levels checked more frequently.Ongoing high lipid and cholesterol levels should be treated with medicines if diet and exercise are not working.   If you smoke, find out from your health care provider how to quit. If you do not use tobacco, do not start.   Lung cancer screening is recommended for adults aged 68-80 years who are at high risk for developing lung cancer because of a  history of smoking. A yearly low-dose CT scan of the lungs is recommended for people who have at least a 30-pack-year history of smoking and are a current smoker or have quit within the past 15 years. A pack year of smoking is smoking an average of 1 pack of cigarettes a day for 1 year (for example: 1 pack a day for 30 years or 2 packs a day for 15 years). Yearly screening should continue until the smoker has stopped smoking for at least 15 years. Yearly screening should be stopped for people who develop a health problem that would prevent them from having lung cancer treatment.   If you are pregnant, do not drink alcohol. If you are breastfeeding, be very cautious about drinking alcohol. If you are not pregnant and choose to drink alcohol, do not have more than 1 drink per day. One drink is considered to be 12 ounces (355 mL) of beer, 5 ounces (148 mL) of wine, or 1.5 ounces (44 mL) of liquor.   Avoid use of street drugs. Do not share needles with anyone. Ask for help if you need support or instructions about stopping the use of drugs.   High blood pressure causes heart disease and increases the risk of stroke. Your blood pressure should be checked at least yearly.  Ongoing high blood pressure should be treated with medicines if weight loss and exercise do not work.   If you are 80-67 years old, ask your health care provider if you should take aspirin to prevent strokes.   Diabetes screening involves taking a blood sample to check your fasting blood sugar level. This should be done once every 3 years, after age 71, if you are within normal  weight and without risk factors for diabetes. Testing should be considered at a younger age or be carried out more frequently if you are overweight and have at least 1 risk factor for diabetes.   Breast cancer screening is essential preventive care for women. You should practice "breast self-awareness."  This means understanding the normal appearance and feel of  your breasts and may include breast self-examination.  Any changes detected, no matter how small, should be reported to a health care provider.  Women in their 51s and 30s should have a clinical breast exam (CBE) by a health care provider as part of a regular health exam every 1 to 3 years.  After age 72, women should have a CBE every year.  Starting at age 39, women should consider having a mammogram (breast X-ray test) every year.  Women who have a family history of breast cancer should talk to their health care provider about genetic screening.  Women at a high risk of breast cancer should talk to their health care providers about having an MRI and a mammogram every year.   -Breast cancer gene (BRCA)-related cancer risk assessment is recommended for women who have family members with BRCA-related cancers. BRCA-related cancers include breast, ovarian, tubal, and peritoneal cancers. Having family members with these cancers may be associated with an increased risk for harmful changes (mutations) in the breast cancer genes BRCA1 and BRCA2. Results of the assessment will determine the need for genetic counseling and BRCA1 and BRCA2 testing.   The Pap test is a screening test for cervical cancer. A Pap test can show cell changes on the cervix that might become cervical cancer if left untreated. A Pap test is a procedure in which cells are obtained and examined from the lower end of the uterus (cervix).   - Women should have a Pap test starting at age 28.   - Between ages 26 and 9, Pap tests should be repeated every 2 years.   - Beginning at age 24, you should have a Pap test every 3 years as long as the past 3 Pap tests have been normal.   - Some women have medical problems that increase the chance of getting cervical cancer. Talk to your health care provider about these problems. It is especially important to talk to your health care provider if a new problem develops soon after your last Pap test. In  these cases, your health care provider may recommend more frequent screening and Pap tests.   - The above recommendations are the same for women who have or have not gotten the vaccine for human papillomavirus (HPV).   - If you had a hysterectomy for a problem that was not cancer or a condition that could lead to cancer, then you no longer need Pap tests. Even if you no longer need a Pap test, a regular exam is a good idea to make sure no other problems are starting.   - If you are between ages 67 and 67 years, and you have had normal Pap tests going back 10 years, you no longer need Pap tests. Even if you no longer need a Pap test, a regular exam is a good idea to make sure no other problems are starting.   - If you have had past treatment for cervical cancer or a condition that could lead to cancer, you need Pap tests and screening for cancer for at least 20 years after your treatment.   - If Pap tests have  been discontinued, risk factors (such as a new sexual partner) need to be reassessed to determine if screening should be resumed.   - The HPV test is an additional test that may be used for cervical cancer screening. The HPV test looks for the virus that can cause the cell changes on the cervix. The cells collected during the Pap test can be tested for HPV. The HPV test could be used to screen women aged 56 years and older, and should be used in women of any age who have unclear Pap test results. After the age of 72, women should have HPV testing at the same frequency as a Pap test.   Colorectal cancer can be detected and often prevented. Most routine colorectal cancer screening begins at the age of 27 years and continues through age 78 years. However, your health care provider may recommend screening at an earlier age if you have risk factors for colon cancer. On a yearly basis, your health care provider may provide home test kits to check for hidden blood in the stool.  Use of a small camera at  the end of a tube, to directly examine the colon (sigmoidoscopy or colonoscopy), can detect the earliest forms of colorectal cancer. Talk to your health care provider about this at age 64, when routine screening begins. Direct exam of the colon should be repeated every 5 -10 years through age 51 years, unless early forms of pre-cancerous polyps or small growths are found.   People who are at an increased risk for hepatitis B should be screened for this virus. You are considered at high risk for hepatitis B if:  -You were born in a country where hepatitis B occurs often. Talk with your health care provider about which countries are considered high risk.  - Your parents were born in a high-risk country and you have not received a shot to protect against hepatitis B (hepatitis B vaccine).  - You have HIV or AIDS.  - You use needles to inject street drugs.  - You live with, or have sex with, someone who has Hepatitis B.  - You get hemodialysis treatment.  - You take certain medicines for conditions like cancer, organ transplantation, and autoimmune conditions.   Hepatitis C blood testing is recommended for all people born from [birth year] through 1965 and any individual with known risks for hepatitis C.   Practice safe sex. Use condoms and avoid high-risk sexual practices to reduce the spread of sexually transmitted infections (STIs). STIs include gonorrhea, chlamydia, syphilis, trichomonas, herpes, HPV, and human immunodeficiency virus (HIV). Herpes, HIV, and HPV are viral illnesses that have no cure. They can result in disability, cancer, and death. Sexually active women aged 79 years and younger should be checked for chlamydia. Older women with new or multiple partners should also be tested for chlamydia. Testing for other STIs is recommended if you are sexually active and at increased risk.   Osteoporosis is a disease in which the bones lose minerals and strength with aging. This can result in serious  bone fractures or breaks. The risk of osteoporosis can be identified using a bone density scan. Women ages 35 years and over and women at risk for fractures or osteoporosis should discuss screening with their health care providers. Ask your health care provider whether you should take a calcium supplement or vitamin D to There are also several preventive steps women can take to avoid osteoporosis and resulting fractures or to keep osteoporosis from worsening. -->Recommendations  include:  Eat a balanced diet high in fruits, vegetables, calcium, and vitamins.  Get enough calcium. The recommended total intake of is 1,200 mg daily; for best absorption, if taking supplements, divide doses into 250-500 mg doses throughout the day. Of the two types of calcium, calcium carbonate is best absorbed when taken with food but calcium citrate can be taken on an empty stomach.  Get enough vitamin D. [facility] and the [place] recommend at least 1,000 IU per day for women age 59 and over who are at risk of vitamin D deficiency. Vitamin D deficiency can be caused by inadequate sun exposure (for example, those who live in [place]).  Avoid alcohol and smoking. Heavy alcohol intake (more than 7 drinks per week) increases the risk of falls and hip fracture and women smokers tend to lose bone more rapidly and have lower bone mass than nonsmokers. Stopping smoking is one of the most important changes women can make to improve their health and decrease risk for disease.  Be physically active every day. Weight-bearing exercise (for example, fast walking, hiking, jogging, and weight training) may strengthen bones or slow the rate of bone loss that comes with aging. Balancing and muscle-strengthening exercises can reduce the risk of falling and fracture.  Consider therapeutic medications. Currently, several types of effective drugs are available. Healthcare providers can recommend the type most  appropriate for each woman.  Eliminate environmental factors that may contribute to accidents. Falls cause nearly 90% of all osteoporotic fractures, so reducing this risk is an important bone-health strategy. Measures include ample lighting, removing obstructions to walking, using nonskid rugs on floors, and placing mats and/or grab bars in showers.  Be aware of medication side effects. Some common medicines make bones weaker. These include a type of steroid drug called glucocorticoids used for arthritis and asthma, some antiseizure drugs, certain sleeping pills, treatments for endometriosis, and some cancer drugs. An overactive thyroid gland or using too much thyroid hormone for an underactive thyroid can also be a problem. If you are taking these medicines, talk to your doctor about what you can do to help protect your bones.reduce the rate of osteoporosis.    Menopause can be associated with physical symptoms and risks. Hormone replacement therapy is available to decrease symptoms and risks. You should talk to your health care provider about whether hormone replacement therapy is right for you.   Use sunscreen. Apply sunscreen liberally and repeatedly throughout the day. You should seek shade when your shadow is shorter than you. Protect yourself by wearing long sleeves, pants, a wide-brimmed hat, and sunglasses year round, whenever you are outdoors.   Once a month, do a whole body skin exam, using a mirror to look at the skin on your back. Tell your health care provider of new moles, moles that have irregular borders, moles that are larger than a pencil eraser, or moles that have changed in shape or color.   -Stay current with required vaccines (immunizations).   Influenza vaccine. All adults should be immunized every year.  Tetanus, diphtheria, and acellular pertussis (Td, Tdap) vaccine. Pregnant women should receive 1 dose of Tdap vaccine during each pregnancy. The dose should be obtained  regardless of the length of time since the last dose. Immunization is preferred during the 27th 36th week of gestation. An adult who has not previously received Tdap or who does not know her vaccine status should receive 1 dose of Tdap. This initial dose should be followed by tetanus  and diphtheria toxoids (Td) booster doses every 10 years. Adults with an unknown or incomplete history of completing a 3-dose immunization series with Td-containing vaccines should begin or complete a primary immunization series including a Tdap dose. Adults should receive a Td booster every 10 years.  Varicella vaccine. An adult without evidence of immunity to varicella should receive 2 doses or a second dose if she has previously received 1 dose. Pregnant females who do not have evidence of immunity should receive the first dose after pregnancy. This first dose should be obtained before leaving the health care facility. The second dose should be obtained 4 8 weeks after the first dose.  Human papillomavirus (HPV) vaccine. Females aged 20 26 years who have not received the vaccine previously should obtain the 3-dose series. The vaccine is not recommended for use in pregnant females. However, pregnancy testing is not needed before receiving a dose. If a female is found to be pregnant after receiving a dose, no treatment is needed. In that case, the remaining doses should be delayed until after the pregnancy. Immunization is recommended for any person with an immunocompromised condition through the age of 2 years if she did not get any or all doses earlier. During the 3-dose series, the second dose should be obtained 4 8 weeks after the first dose. The third dose should be obtained 24 weeks after the first dose and 16 weeks after the second dose.  Zoster vaccine. One dose is recommended for adults aged 88 years or older unless certain conditions are present.  Measles, mumps, and rubella (MMR) vaccine. Adults born before [birth year]  generally are considered immune to measles and mumps. Adults born in [birth year] or later should have 1 or more doses of MMR vaccine unless there is a contraindication to the vaccine or there is laboratory evidence of immunity to each of the three diseases. A routine second dose of MMR vaccine should be obtained at least 28 days after the first dose for students attending postsecondary schools, health care workers, or international travelers. People who received inactivated measles vaccine or an unknown type of measles vaccine during 1963 1967 should receive 2 doses of MMR vaccine. People who received inactivated mumps vaccine or an unknown type of mumps vaccine before 1979 and are at high risk for mumps infection should consider immunization with 2 doses of MMR vaccine. For females of childbearing age, rubella immunity should be determined. If there is no evidence of immunity, females who are not pregnant should be vaccinated. If there is no evidence of immunity, females who are pregnant should delay immunization until after pregnancy. Unvaccinated health care workers born before [birth year] who lack laboratory evidence of measles, mumps, or rubella immunity or laboratory confirmation of disease should consider measles and mumps immunization with 2 doses of MMR vaccine or rubella immunization with 1 dose of MMR vaccine.  Pneumococcal 13-valent conjugate (PCV13) vaccine. When indicated, a person who is uncertain of her immunization history and has no record of immunization should receive the PCV13 vaccine. An adult aged 44 years or older who has certain medical conditions and has not been previously immunized should receive 1 dose of PCV13 vaccine. This PCV13 should be followed with a dose of pneumococcal polysaccharide (PPSV23) vaccine. The PPSV23 vaccine dose should be obtained at least 8 weeks after the dose of PCV13 vaccine. An adult aged 64 years or older who has certain medical conditions and previously received 1 or more  doses of PPSV23 vaccine should receive 1 dose  of PCV13. The PCV13 vaccine dose should be obtained 1 or more years after the last PPSV23 vaccine dose.  Pneumococcal polysaccharide (PPSV23) vaccine. When PCV13 is also indicated, PCV13 should be obtained first. All adults aged 33 years and older should be immunized. An adult younger than age 65 years who has certain medical conditions should be immunized. Any person who resides in a nursing home or long-term care facility should be immunized. An adult smoker should be immunized. People with an immunocompromised condition and certain other conditions should receive both PCV13 and PPSV23 vaccines. People with human immunodeficiency virus (HIV) infection should be immunized as soon as possible after diagnosis. Immunization during chemotherapy or radiation therapy should be avoided. Routine use of PPSV23 vaccine is not recommended for American Indians, [place]atives, or people younger than 65 years unless there are medical conditions that require PPSV23 vaccine. When indicated, people who have unknown immunization and have no record of immunization should receive PPSV23 vaccine. One-time revaccination 5 years after the first dose of PPSV23 is recommended for people aged 57 64 years who have chronic kidney failure, nephrotic syndrome, asplenia, or immunocompromised conditions. People who received 1 2 doses of PPSV23 before age 17 years should receive another dose of PPSV23 vaccine at age 7 years or later if at least 5 years have passed since the previous dose. Doses of PPSV23 are not needed for people immunized with PPSV23 at or after age 31 years.  Meningococcal vaccine. Adults with asplenia or persistent complement component deficiencies should receive 2 doses of quadrivalent meningococcal conjugate (MenACWY-D) vaccine. The doses should be obtained at least 2 months apart. Microbiologists working with certain meningococcal bacteria, [employer] recruits, people at risk  during an outbreak, and people who travel to or live in countries with a high rate of meningitis should be immunized. A first-year college student up through age 89 years who is living in a residence hall should receive a dose if she did not receive a dose on or after her 16th birthday. Adults who have certain high-risk conditions should receive one or more doses of vaccine.  Hepatitis A vaccine. Adults who wish to be protected from this disease, have certain high-risk conditions, work with hepatitis A-infected animals, work in hepatitis A research labs, or travel to or work in countries with a high rate of hepatitis A should be immunized. Adults who were previously unvaccinated and who anticipate close contact with an international adoptee during the first 60 days after arrival in the [place] from a country with a high rate of hepatitis A should be immunized.  Hepatitis B vaccine.  Adults who wish to be protected from this disease, have certain high-risk conditions, may be exposed to blood or other infectious body fluids, are household contacts or sex partners of hepatitis B positive people, are clients or workers in certain care facilities, or travel to or work in countries with a high rate of hepatitis B should be immunized.  Haemophilus influenzae type b (Hib) vaccine. A previously unvaccinated person with asplenia or sickle cell disease or having a scheduled splenectomy should receive 1 dose of Hib vaccine. Regardless of previous immunization, a recipient of a hematopoietic stem cell transplant should receive a 3-dose series 6 12 months after her successful transplant. Hib vaccine is not recommended for adults with HIV infection.  Preventive Services / Frequency Ages 74 to 39years  Blood pressure check.** / Every 1 to 2 years.  Lipid and cholesterol check.** / Every 5 years beginning at age  20.  Clinical breast exam.** / Every 3 years for women in their 30s and 30s.  BRCA-related cancer  risk assessment.** / For women who have family members with a BRCA-related cancer (breast, ovarian, tubal, or peritoneal cancers).  Pap test.** / Every 2 years from ages 44 through 36. Every 3 years starting at age 8 through age 82 or 48 with a history of 3 consecutive normal Pap tests.  HPV screening.** / Every 3 years from ages 57 through ages 53 to 41 with a history of 3 consecutive normal Pap tests.  Hepatitis C blood test.** / For any individual with known risks for hepatitis C.  Skin self-exam. / Monthly.  Influenza vaccine. / Every year.  Tetanus, diphtheria, and acellular pertussis (Tdap, Td) vaccine.** / Consult your health care provider. Pregnant women should receive 1 dose of Tdap vaccine during each pregnancy. 1 dose of Td every 10 years.  Varicella vaccine.** / Consult your health care provider. Pregnant females who do not have evidence of immunity should receive the first dose after pregnancy.  HPV vaccine. / 3 doses over 6 months, if 20 and younger. The vaccine is not recommended for use in pregnant females. However, pregnancy testing is not needed before receiving a dose.  Measles, mumps, rubella (MMR) vaccine.** / You need at least 1 dose of MMR if you were born in 1957 or later. You may also need a 2nd dose. For females of childbearing age, rubella immunity should be determined. If there is no evidence of immunity, females who are not pregnant should be vaccinated. If there is no evidence of immunity, females who are pregnant should delay immunization until after pregnancy.  Pneumococcal 13-valent conjugate (PCV13) vaccine.** / Consult your health care provider.  Pneumococcal polysaccharide (PPSV23) vaccine.** / 1 to 2 doses if you smoke cigarettes or if you have certain conditions.  Meningococcal vaccine.** / 1 dose if you are age 4 to 83 years and a [employer] researcher living in a residence hall, or have one of several medical conditions, you need to get  vaccinated against meningococcal disease. You may also need additional booster doses.  Hepatitis A vaccine.** / Consult your health care provider.  Hepatitis B vaccine.** / Consult your health care provider.  Haemophilus influenzae type b (Hib) vaccine.** / Consult your health care provider.  Ages 36 to 64years  Blood pressure check.** / Every 1 to 2 years.  Lipid and cholesterol check.** / Every 5 years beginning at age 39 years.  Lung cancer screening. / Every year if you are aged 21 80 years and have a 30-pack-year history of smoking and currently smoke or have quit within the past 15 years. Yearly screening is stopped once you have quit smoking for at least 15 years or develop a health problem that would prevent you from having lung cancer treatment.  Clinical breast exam.** / Every year after age 41 years.  BRCA-related cancer risk assessment.** / For women who have family members with a BRCA-related cancer (breast, ovarian, tubal, or peritoneal cancers).  Mammogram.** / Every year beginning at age 31 years and continuing for as long as you are in good health. Consult with your health care provider.  Pap test.** / Every 3 years starting at age 21 years through age 5 or 24 years with a history of 3 consecutive normal Pap tests.  HPV screening.** / Every 3 years from ages 84 years through ages 97 to 63 years with a history of 3 consecutive normal Pap tests.  Fecal occult blood test (FOBT) of stool. / Every year beginning at age 33 years and continuing until age 58 years. You may not need to do this test if you get a colonoscopy every 10 years.  Flexible sigmoidoscopy or colonoscopy.** / Every 5 years for a flexible sigmoidoscopy or every 10 years for a colonoscopy beginning at age 74 years and continuing until age 36 years.  Hepatitis C blood test.** / For all people born from [birth year] through 1965 and any individual with known risks for hepatitis C.  Skin self-exam. /  Monthly.  Influenza vaccine. / Every year.  Tetanus, diphtheria, and acellular pertussis (Tdap/Td) vaccine.** / Consult your health care provider. Pregnant women should receive 1 dose of Tdap vaccine during each pregnancy. 1 dose of Td every 10 years.  Varicella vaccine.** / Consult your health care provider. Pregnant females who do not have evidence of immunity should receive the first dose after pregnancy.  Zoster vaccine.** / 1 dose for adults aged 70 years or older.  Measles, mumps, rubella (MMR) vaccine.** / You need at least 1 dose of MMR if you were born in 1957 or later. You may also need a 2nd dose. For females of childbearing age, rubella immunity should be determined. If there is no evidence of immunity, females who are not pregnant should be vaccinated. If there is no evidence of immunity, females who are pregnant should delay immunization until after pregnancy.  Pneumococcal 13-valent conjugate (PCV13) vaccine.** / Consult your health care provider.  Pneumococcal polysaccharide (PPSV23) vaccine.** / 1 to 2 doses if you smoke cigarettes or if you have certain conditions.  Meningococcal vaccine.** / Consult your health care provider.  Hepatitis A vaccine.** / Consult your health care provider.  Hepatitis B vaccine.** / Consult your health care provider.  Haemophilus influenzae type b (Hib) vaccine.** / Consult your health care provider.  Ages 70 years and over  Blood pressure check.** / Every 1 to 2 years.  Lipid and cholesterol check.** / Every 5 years beginning at age 73 years.  Lung cancer screening. / Every year if you are aged 71 80 years and have a 30-pack-year history of smoking and currently smoke or have quit within the past 15 years. Yearly screening is stopped once you have quit smoking for at least 15 years or develop a health problem that would prevent you from having lung cancer treatment.  Clinical breast exam.** / Every year after age 23  years.  BRCA-related cancer risk assessment.** / For women who have family members with a BRCA-related cancer (breast, ovarian, tubal, or peritoneal cancers).  Mammogram.** / Every year beginning at age 23 years and continuing for as long as you are in good health. Consult with your health care provider.  Pap test.** / Every 3 years starting at age 70 years through age 59 or 54 years with 3 consecutive normal Pap tests. Testing can be stopped between 65 and 70 years with 3 consecutive normal Pap tests and no abnormal Pap or HPV tests in the past 10 years.  HPV screening.** / Every 3 years from ages 54 years through ages 78 or 45 years with a history of 3 consecutive normal Pap tests. Testing can be stopped between 65 and 70 years with 3 consecutive normal Pap tests and no abnormal Pap or HPV tests in the past 10 years.  Fecal occult blood test (FOBT) of stool. / Every year beginning at age 30 years and continuing until age 22 years. You may not  need to do this test if you get a colonoscopy every 10 years.  Flexible sigmoidoscopy or colonoscopy.** / Every 5 years for a flexible sigmoidoscopy or every 10 years for a colonoscopy beginning at age 71 years and continuing until age 54 years.  Hepatitis C blood test.** / For all people born from [birth year] through 1965 and any individual with known risks for hepatitis C.  Osteoporosis screening.** / A one-time screening for women ages 67 years and over and women at risk for fractures or osteoporosis.  Skin self-exam. / Monthly.  Influenza vaccine. / Every year.  Tetanus, diphtheria, and acellular pertussis (Tdap/Td) vaccine.** / 1 dose of Td every 10 years.  Varicella vaccine.** / Consult your health care provider.  Zoster vaccine.** / 1 dose for adults aged 22 years or older.  Pneumococcal 13-valent conjugate (PCV13) vaccine.** / Consult your health care provider.  Pneumococcal polysaccharide (PPSV23) vaccine.** / 1 dose for all adults aged 66  years and older.  Meningococcal vaccine.** / Consult your health care provider.  Hepatitis A vaccine.** / Consult your health care provider.  Hepatitis B vaccine.** / Consult your health care provider.  Haemophilus influenzae type b (Hib) vaccine.** / Consult your health care provider. ** Family history and personal history of risk and conditions may change your health care provider's recommendations. Document Released: 01/09/2002 Document Revised: 09/03/2013  [facility] Patient Information 2014 [place], [place].   EXERCISE AND DIET:  We recommended that you start or continue a regular exercise program for good health. Regular exercise means any activity that makes your heart beat faster and makes you sweat.  We recommend exercising at least 30 minutes per day at least 3 days a week, preferably 5.  We also recommend a diet low in fat and sugar / carbohydrates.  Inactivity, poor dietary choices and obesity can cause diabetes, heart attack, stroke, and kidney damage, among others.     ALCOHOL AND SMOKING:  Women should limit their alcohol intake to no more than 7 drinks/beers/glasses of wine (combined, not each!) per week. Moderation of alcohol intake to this level decreases your risk of breast cancer and liver damage.  ( And of course, no recreational drugs are part of a healthy lifestyle.)  Also, you should not be smoking at all or even being exposed to second hand smoke. Most people know smoking can cause cancer, and various heart and lung diseases, but did you know it also contributes to weakening of your bones?  Aging of your skin?  Yellowing of your teeth and nails?   CALCIUM AND VITAMIN D:  Adequate intake of calcium and Vitamin D are recommended.  The recommendations for exact amounts of these supplements seem to change often, but generally speaking 600 mg of calcium (either carbonate or citrate) and 800 units of Vitamin D per day seems prudent. Certain women may benefit from higher intake  of Vitamin D.  If you are among these women, your doctor will have told you during your visit.     PAP SMEARS:  Pap smears, to check for cervical cancer or precancers,  have traditionally been done yearly, although recent scientific advances have shown that most women can have pap smears less often.  However, every woman still should have a physical exam from her gynecologist or primary care physician every year. It will include a breast check, inspection of the vulva and vagina to check for abnormal growths or skin changes, a visual exam of the cervix, and then an exam to evaluate the  size and shape of the uterus and ovaries.  And after 73 years of age, a rectal exam is indicated to check for rectal cancers. We will also provide age appropriate advice regarding health maintenance, like when you should have certain vaccines, screening for sexually transmitted diseases, bone density testing, colonoscopy, mammograms, etc.    MAMMOGRAMS:  All women over 33 years old should have a yearly mammogram. Many facilities now offer a "3D" mammogram, which may cost around $50 extra out of pocket. If possible,  we recommend you accept the option to have the 3D mammogram performed.  It both reduces the number of women who will be called back for extra views which then turn out to be normal, and it is better than the routine mammogram at detecting truly abnormal areas.     COLONOSCOPY:  Colonoscopy to screen for colon cancer is recommended for all women at age 75.  We know, you hate the idea of the prep.  We agree, BUT, having colon cancer and not knowing it is worse!!  Colon cancer so often starts as a polyp that can be seen and removed at colonscopy, which can quite literally save your life!  And if your first colonoscopy is normal and you have no family history of colon cancer, most women don't have to have it again for 10 years.  Once every ten years, you can do something that may end up saving your life, right?  We  will be happy to help you get it scheduled when you are ready.  Be sure to check your insurance coverage so you understand how much it will cost.  It may be covered as a preventative service at no cost, but you should check your particular policy.

## 2019-01-13 NOTE — Progress Notes (Signed)
Subjective:   [name] is a 73 y.o. female who presents for Medicare Annual (Subsequent) preventive examination.   Health Maintenance Summary Reviewed and updated, unless pt declines services. Health Maintenance  Topic Date Due  . INFLUENZA VACCINE  10/31/2019 (Originally 06/27/2018)  . Hepatitis C Screening  01/14/2020 (Originally 07-23-1946)  . PNA vac Low Risk Adult (1 of 2 - PCV13) 01/14/2020 (Originally 10/20/2011)  . MAMMOGRAM  10/25/2019  . COLONOSCOPY  04/11/2025  . TETANUS/TDAP  12/05/2028  . DEXA SCAN  Completed    Colonoscopy:  Patient needs colonoscopy. Tobacco History Reviewed:  Y; never smoker.  Alcohol:  History of alcohol abuse. Exercise Habits:  Not meeting AHA guidelines. STD concerns:  None. Drug Use:  None. Birth control method:  Post-menopausal. Menses regular:  Post-menopausal. Lumps or breast concerns:  Last mammogram obtained in 2018. Bone/ DEXA scan:  Last DEXA obtained 10/01/2017  [place], [place] and [place], and then going to [place].  Notes she's still not on a daytime schedule, commenting that she's not awake in the daytime and doesn't sleep at night.  Recent Fall Had a fall recently while bending over to get the newspaper.  Notes she uses a cane and not a walker.  Patient has two grabby sticks.  Says "I think I just lost my balance."  Denies dizziness.  Notes she's very careful at home since she's alone.  Mood PHQ = 3.5 today.  Patient's cat recently died and notes she's feeling alone in the daytime.    Hearing Health Patient does not have hearing aids.  States she has "horrendous tinnitus."  She has not been following up with ENT for this.  Feels she will ask ENT about the ears when she goes to have her nose/septum fixed from her recent fall.  Knees Notes she is not in pain.  States "I need to get up off my butt and move more, and I know that."  Denies chest pain or SOB.   Review of  Systems: General:   Denies fever, chills, unexplained weight loss.  Optho/Auditory:   Denies visual changes, blurred vision/LOV Respiratory:   Denies SOB, DOE more than baseline levels.  Cardiovascular:   Denies chest pain, palpitations, new onset peripheral edema  Gastrointestinal:   Denies nausea, vomiting, diarrhea.  Genitourinary: Denies dysuria, freq/ urgency, flank pain or discharge from genitals.  Endocrine:     Denies hot or cold intolerance, polyuria, polydipsia. Musculoskeletal:   Denies unexplained myalgias, joint swelling, unexplained arthralgias, gait problems.  Skin:  Denies rash, suspicious lesions Neurological:     Denies dizziness, unexplained weakness, numbness  Psychiatric/Behavioral:   Denies mood changes, suicidal or homicidal ideations, hallucinations     Objective:     Vitals: BP (!) 147/82   Pulse 66   Ht 5\' 5"  (1.651 m)   Wt 240 lb 8 oz (109.1 kg)   SpO2 98%   BMI 40.02 kg/m   Body mass index is 40.02 kg/m.  Advanced Directives 12/04/2016 06/14/2016 04/25/2015 01/15/2013  Does Patient Have a Medical Advance Directive? No No No Patient does not have advance directive  Would patient like information on creating a medical advance directive? (No Data) No - patient declined information No - patient declined information -  Pre-existing out of facility DNR order (yellow form or pink MOST form) - - - No    Tobacco Social History   Tobacco Use  Smoking Status Never Smoker  Smokeless Tobacco Never Used  Counseling given: Not Answered   Past Medical History:  Diagnosis Date  . Alcohol abuse 04/25/2015  . Allergy   . Anastomotic ulcer S/P gastric bypass 06/18/2016  . Arthritis   . Cataract   . Degenerative disc disease, cervical   . GAD (generalized anxiety disorder) 06/14/2016  . HTN (hypertension) 04/25/2015  . Hypertension   . Hypothyroidism   . 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 Substance abuse  (HCC)    Alcohol abuse 2016  . UGIB (upper gastrointestinal bleed) 04/25/2015   Past Surgical History:  Procedure Laterality Date  . CHOLECYSTECTOMY    . COSMETIC SURGERY    . DIAGNOSTIC LAPAROSCOPY    . ESOPHAGOGASTRODUODENOSCOPY N/A 04/25/2015   Procedure: ESOPHAGOGASTRODUODENOSCOPY (EGD);  Surgeon: Beverley Fiedler, MD;  Location: [facility] ENDOSCOPY;  Service: Endoscopy;  Laterality: N/A;  . FRACTURE SURGERY    . GASTRIC BYPASS    . NM MYOVIEW LTD  10/10/2017   Normal LV size and function EF 72%.  Medium sized mild severity defect in the apical septal apical lateral and apical wall suggestive of breast attenuation.  LOW RISK.  No ischemia or infarction.    . OPEN REDUCTION INTERNAL FIXATION (ORIF) DISTAL RADIAL FRACTURE Right 01/15/2013   Procedure: OPEN REDUCTION INTERNAL FIXATION (ORIF) Right DISTAL RADIUS FRACTURE;  Surgeon: Eldred Manges, MD;  Location: [facility] OR;  Service: Orthopedics;  Laterality: Right;  . TRANSTHORACIC ECHOCARDIOGRAM  10/04/2017   Mild LVH.  EF 60-65%.  Grade 2/moderate diastolic dysfunction.  Mild pulmonary hypertension.  No valve lesions   Family History  Problem Relation Age of Onset  . Heart disease Mother   . Stroke Mother 5  . Cancer Mother        skin and breast  . Hypertension Mother   . Breast cancer Mother 71  . COPD Father   . Cancer Father   . Diabetes Father   . Hypertension Father   . Stroke Maternal Grandmother   . Mental illness Maternal Grandmother   . Breast cancer Sister   . Cancer Cousin 46   Social History   Socioeconomic History  . Marital status: Married    Spouse name: Not on file  . Number of children: Not on file  . Years of education: Not on file  . Highest education level: Not on file  Occupational History  . Occupation: Charity fundraiser    Comment: ICU Sales executive  Social Needs  . Financial resource strain: Not on file  . Food insecurity:    Worry: Not on file    Inability: Not on file  . Transportation needs:    Medical: Not on file     Non-medical: Not on file  Tobacco Use  . Smoking status: Never Smoker  . Smokeless tobacco: Never Used  Substance and Sexual Activity  . Alcohol use: Yes    Alcohol/week: 28.0 standard drinks    Types: 28 Glasses of wine per week    Comment: drinks daily and has a bottle of wine per day  . Drug use: No  . Sexual activity: Yes    Birth control/protection: Post-menopausal  Lifestyle  . Physical activity:    Days per week: Not on file    Minutes per session: Not on file  . Stress: Not on file  Relationships  . Social connections:    Talks on phone: Not on file    Gets together: Not on file  Attends religious service: Not on file    Active member of club or organization: Not on file    Attends meetings of clubs or organizations: Not on file    Relationship status: Not on file  Other Topics Concern  . 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 acetaminophen (TYLENOL) 500 MG tablet Take 1,000 mg by mouth daily.   Marland Kitchen atorvastatin (LIPITOR) 80 MG tablet Take 1 tablet (80 mg total) by mouth at bedtime.  . benazepril-hydrochlorthiazide (LOTENSIN HCT) 20-25 MG tablet TAKE 1 TABLET BY MOUTH DAILY.  . carvedilol (COREG) 6.25 MG tablet Take 1 tablet (6.25 mg total) by mouth 2 (two) times daily with a meal.  . Cholecalciferol (VITAMIN D3) 5000 units TABS 5,000 IU OTC vitamin D3 daily. (Patient taking differently: Take 15,000 Units by mouth daily. 5,000 IU OTC vitamin D3 daily.)  . cyanocobalamin (,VITAMIN B-12,) 1000 MCG/ML injection INJECT 1 ML INTO THE MUSCLE EVERY 30 DAYS.  Marland Kitchen cyclobenzaprine (FLEXERIL) 10 MG tablet TAKE 1 TABLET BY MOUTH 3 TIMES DAILY AS NEEDED FOR MUSCLE SPASMS.  Marland Kitchen diphenhydrAMINE (BENADRYL) 25 MG tablet Take 25 mg by mouth at bedtime as  needed.   . ferrous sulfate 325 (65 FE) MG tablet Take 325 mg by mouth daily with breakfast.  . furosemide (LASIX) 40 MG tablet Take 0.5 tablets (20 mg total) by mouth daily.  Marland Kitchen gabapentin (NEURONTIN) 300 MG capsule TAKE 3 CAPSULES (900 MG TOTAL) BY MOUTH 3 (THREE) TIMES DAILY.  Marland Kitchen levothyroxine (SYNTHROID, LEVOTHROID) 150 MCG tablet TAKE 1 TABLET BY MOUTH DAILY.  . magnesium 30 MG tablet Take 30 mg by mouth 2 (two) times a week. Patient takes prn and takes 1 gram  . Melatonin 5 MG CAPS Take 10 mg by mouth at bedtime.   . meloxicam (MOBIC) 15 MG tablet Take 1 tablet (15 mg total) by mouth daily.  . Multiple Vitamin (MULTIVITAMIN) tablet Take 1 tablet by mouth daily.  . mupirocin ointment (BACTROBAN) 2 % Apply to affected wounds twice daily.  . pantoprazole (PROTONIX) 40 MG tablet TAKE 1 TABLET BY MOUTH DAILY.  . traMADol (ULTRAM) 50 MG tablet TAKE 1 TABLET BY MOUTH 2 TIMES DAILY. MAXIMUM 6 TABS PER DAY.  . [DISCONTINUED] Liraglutide -Weight Management (SAXENDA) 18 MG/3ML SOPN Inject 3 mg into the skin daily. 0.6 mg inj subcut daily for 1 week, then incr by 0.6 mg weekly until reaching 3 mg injected subcut daily   No facility-administered encounter medications on file as of 01/13/2019.     Activities of Daily Living In your present state of health, do you have difficulty performing the following activities?  1- Driving - no 2- Managing money - no 3- Feeding yourself - no 4- Getting from the bed to the chair - no 5- Climbing a flight of stairs - yes 6- Preparing food and eating - no 7- Bathing or showering - no 8- Getting dressed - no 9- Getting to the toilet - no 10- Using the toilet - no 11- Moving around from place to place - no  Patient states that she feels safe at hom   Patient Care Team: Thomasene Lot, DO as PCP - General (Family Medicine) Herbie Baltimore,  Piedad Climesavid W, MD as PCP - Cardiology (Cardiology) Monica Bectonhekkekandam, Thomas J, MD as Consulting Physician (Sports Medicine) Jerolyn ShinMieden,  Gregory, MD as Consulting Physician (Neurology) Marykay LexHarding, David W, MD as Consulting Physician (Cardiology)    Assessment:   This is a routine wellness examination for Lizet.  Exercise Activities and Dietary recommendations      Exercise limited by: orthopedic condition(s)     Goals   None     Fall Risk Fall Risk  07/04/2018 03/04/2018 11/19/2017 11/19/2017 04/17/2017  Falls in the past year? No No No No Yes  Number falls in past yr: - - - - 2 or more  Injury with Fall? - - - - No  Follow up - - - - Education provided   Is the patient's home free of loose throw rugs in walkways, pet beds, electrical cords, etc?   yes      Grab bars in the bathroom? yes      Handrails on the stairs?   yes      Adequate lighting?   yes  Timed Get Up and Go performed: 21 seconds  Depression Screen PHQ 2/9 Scores 01/13/2019 11/04/2018 09/09/2018 07/04/2018  PHQ - 2 Score 1 0 1 0  PHQ- 9 Score 4 5 11 2     [facility] 01/13/2019 11/04/2018 09/09/2018 07/04/2018 03/04/2018  Decreased Interest 0 0 0 0 0  Down, Depressed, Hopeless 1 0 1 0 0  PHQ - 2 Score 1 0 1 0 0  Altered sleeping 2 3 3 2 2   Tired, decreased energy 1 1 3  0 1  Change in appetite 0 1 1 0 1  Feeling bad or failure about yourself  0 0 0 0 0  Trouble concentrating 0 0 1 0 0  Moving slowly or fidgety/restless 0 0 2 0 0  Suicidal thoughts 0 0 0 0 0  PHQ-9 Score 4 5 11 2 4   Difficult doing work/chores Not difficult at all Not difficult at all Somewhat difficult - Not difficult at all  Some recent data might be hidden    Cognitive Function    6CIT Screen 01/13/2019  What Year? 0 points  What month? 0 points  What time? 0 points  Count back from 20 0 points  Months in reverse 0 points  Repeat phrase 0 points  Total Score 0    Immunization History  Administered Date(s) Administered  . Influenza, High Dose Seasonal PF 09/25/2017  . Influenza,inj,Quad PF,6+ Mos 01/10/2017  . Td 11/27/2013  . Tdap 12/05/2018     Qualifies for Shingles Vaccine?yes - vaccine was sent to the pharmacy  Screening Tests Health Maintenance  Topic Date Due  . INFLUENZA VACCINE  10/31/2019 (Originally 06/27/2018)  . Hepatitis C Screening  01/14/2020 (Originally 08/10/1946)  . PNA vac Low Risk Adult (1 of 2 - PCV13) 01/14/2020 (Originally 10/20/2011)  . MAMMOGRAM  10/25/2019  . COLONOSCOPY  04/11/2025  . TETANUS/TDAP  12/05/2028  . DEXA SCAN  Completed    Cancer Screenings: Lung: Low Dose CT Chest recommended if Age 44-80 years, 30 pack-year currently smoking OR have quit w/in 15years. Patient does not qualify. Breast:  Up to date on Mammogram? No  Patient has appointment in 1 week Up to date of Bone Density/Dexa? Yes Colorectal: [id number] Dr. Elnoria HowardHung - requesting a copy of report for chart  Additional Screenings: : Hepatitis C Screening: declined     Plan:      Impression and Recommendations:    1. Encounter for  Medicare annual wellness exam   2. Need for pneumococcal vaccination   3. Need for shingles vaccine   4. Colon cancer screening     1) Anticipatory Guidance: Discussed importance of wearing a seatbelt while driving, not texting while driving; sunscreen when outside along with yearly skin surveillance; eating a well balanced and modest diet; physical activity at least 25 minutes per day or 150 min/ week of moderate to intense activity.  Recent Fall - Advised patient to take precautions, to use her four-point walker for more stability instead of using her cane.  Instead of bending over, advised patient to use her grabber stick.  - Discussed that if patient falls again, we will need to obtain further assessment.  Hearing Health - Advised patient to obtain hearing evaluation in near future. - Patient declines hearing evaluation at this time. - STRONGLY advised patient to bring up her hearing concerns to ENT. - Patient will let us know if she decides to follow-up regarding her hearing.  Orthopedic  Health - Knees - Advised patient to ask Dr. Benjamin Stain if she needs any kind of PT or rehabilitation.  2) Immunizations / Screenings / Labs: All immunizations and screenings that patient agrees to, are up-to-date per recommendations or will be updated today.  Patient understands the needs for q 61mo dental and yearly vision screens which pt will schedule independently. Obtain CBC, CMP, HgA1c, Lipid panel, TSH and vit D when fasting if not already done recently.   - Need for mammogram in near future.  Last mammogram obtained in 2018.  - Need for colonoscopy in near future.  Patient would like to see Dr. Rhea Belton.  - Need for shingles vaccine.  - Need for pneumonia vaccine.  3) Weight:   Discussed goal of losing even 5-10% of current body weight which would improve overall feelings of well being and improve objective health data significantly.  Improve nutrient density of diet through increasing intake of fruits and vegetables and decreasing saturated/trans fats, white flour products and refined sugar products.   4) Mood: - Patient's PHQ is 3.5 today. - Per patient, her cat recently died and notes she's feeling alone and sad during the daytime due to this.  5) BMI Counseling - BMI of 40.02 Explained to patient what BMI refers to, and what it means medically.  Told patient to think about it as a "medical risk stratification measurement" and how increasing BMI is associated with increasing risk/ or worsening state of various diseases such as hypertension, hyperlipidemia, diabetes, premature OA, depression etc.  American Heart Association guidelines for healthy diet, basically Mediterranean diet, and exercise guidelines of 30 minutes 5 days per week or more discussed in detail.  Health counseling performed.  All questions answered.  6) Lifestyle & Preventative Health Maintenance - Advised patient to continue working toward exercising at least three days per week to improve overall mental,  physical, and emotional health.  Advised patient to join a gym or gym class weekly.  - Encouraged patient to engage in daily physical activity, especially a formal exercise routine.  Recommended that the patient eventually strive for at least 150 minutes of moderate cardiovascular activity per week according to guidelines established by the [facility].   - Healthy dietary habits encouraged, including low-carb, and high amounts of lean protein in diet.   - Patient should also consume adequate amounts of water.   Meds ordered this encounter  Medications  . Zoster Vaccine Adjuvanted [facility]) injection    Sig: Inject 0.5 mLs  into the muscle once for 1 dose.    Dispense:  0.5 mL    Refill:  0  . pneumococcal 13-valent conjugate vaccine (PREVNAR 13) SUSP injection    Sig: Inject 0.5 mLs into the muscle tomorrow at 10 am for 1 dose.    Dispense:  0.5 mL    Refill:  0    Orders Placed This Encounter  Procedures  . Ambulatory referral to Gastroenterology    Gross side effects, risk and benefits, and alternatives of medications discussed with patient.  Patient is aware that all medications have potential side effects and we are unable to predict every side effect or drug-drug interaction that may occur.  Expresses verbal understanding and consents to current therapy plan and treatment regimen.  F-up preventative CPE in 1 year. F/up sooner for chronic care management as discussed and/or prn.  Please see orders placed and AVS handed out to patient at the end of our visit for further patient instructions/ counseling done pertaining to today's office visit.  This document serves as a record of services personally performed by Thomasene Lot, DO. It was created on her behalf by Peggye Fothergill, a trained medical scribe. The creation of this record is based on the scribe's personal observations and the provider's statements to them.   I have reviewed the above medical documentation for accuracy and  completeness and I concur.  Thomasene Lot, DO 01/14/2019 12:09 PM    I have personally reviewed and noted the following in the patient's chart:   . Medical and social history . Use of alcohol, tobacco or illicit drugs  . Current medications and supplements . Functional ability and status . Nutritional status . Physical activity . Advanced directives . List of other physicians . Hospitalizations, surgeries, and ER visits in previous 12 months . Vitals . Screenings to include cognitive, depression, and falls . Referrals and appointments  In addition, I have reviewed and discussed with patient certain preventive protocols, quality metrics, and best practice recommendations. A written personalized care plan for preventive services as well as general preventive health recommendations were provided to patient.

## 2019-01-22 ENCOUNTER — Ambulatory Visit
Admission: RE | Admit: 2019-01-22 | Discharge: 2019-01-22 | Disposition: A | Discharge status: Home / Self Care | Payer: Medicare PPO | Source: Ambulatory Visit | Attending: Family Medicine | Admitting: Family Medicine

## 2019-01-22 DIAGNOSIS — Z1231 Encounter for screening mammogram for malignant neoplasm of breast: Secondary | ICD-10-CM

## 2019-01-22 IMAGING — MG DIGITAL SCREENING BILATERAL MAMMOGRAM WITH TOMO AND CAD
8 series · 8 of 24 positions shown · non-contrast
Comparison: Previous exam(s).

ACR Breast Density Category a: The breast tissue is almost entirely
fatty.

CLINICAL DATA: Screening.

EXAM:
DIGITAL SCREENING BILATERAL MAMMOGRAM WITH TOMO AND CAD

[L CC synth-2D]
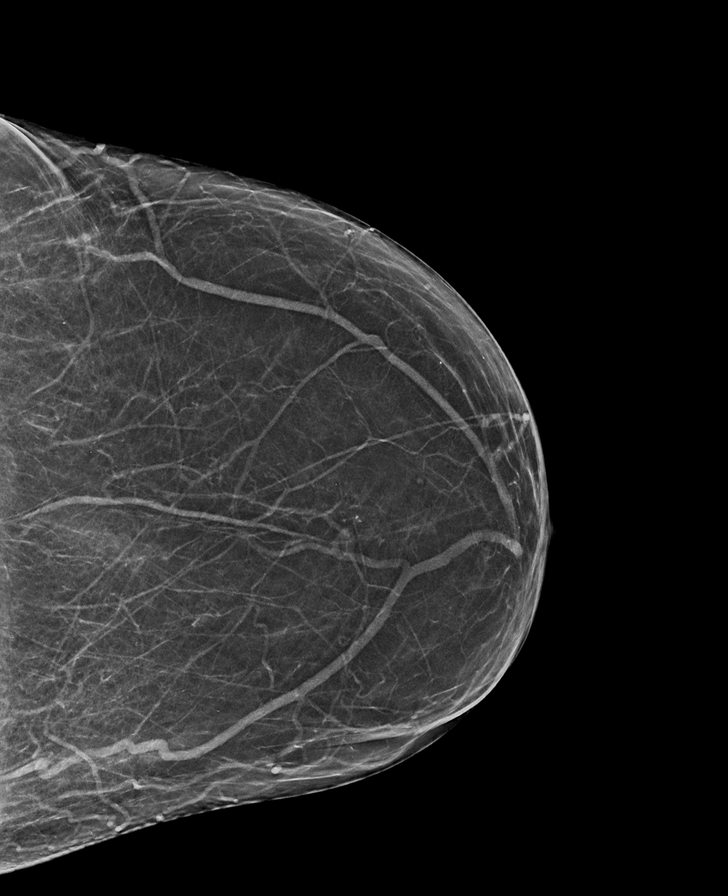

[R MLO synth-2D]
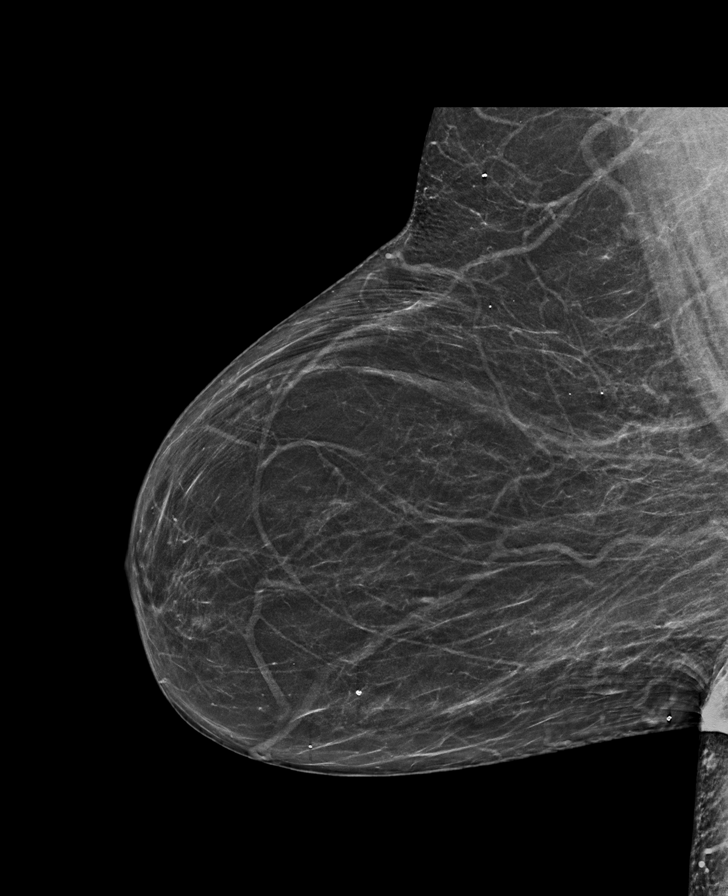

[L MLO synth-2D]
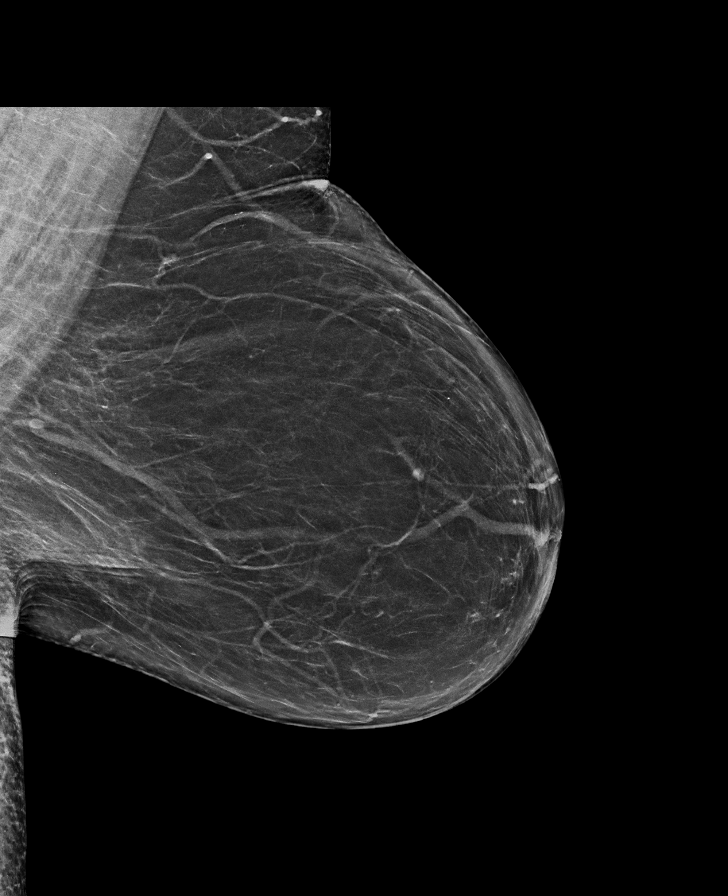

[R CC synth-2D]
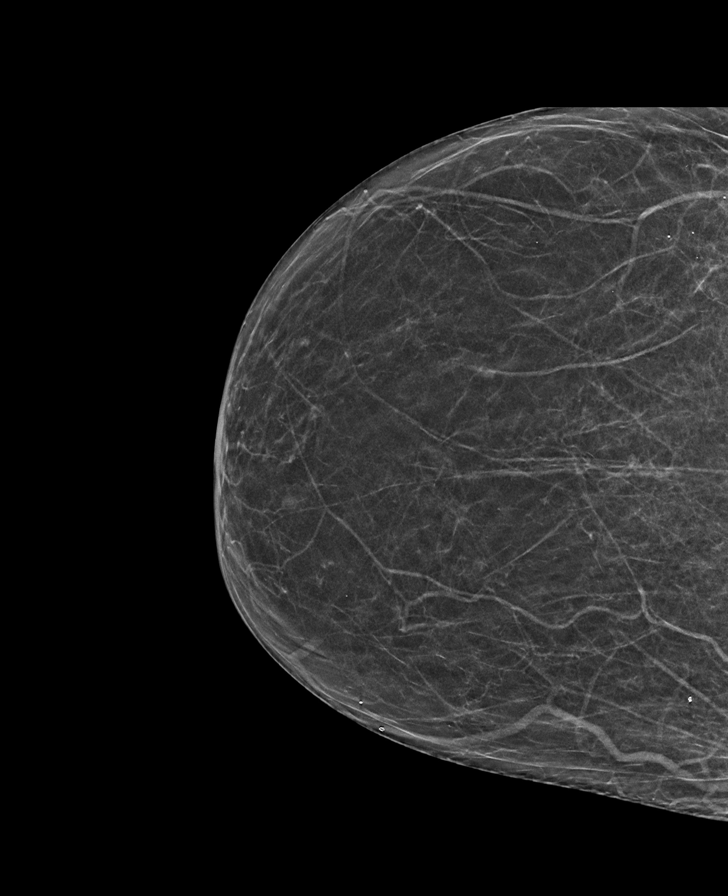

[L MLO tomo · tomo slice 36/71.0]
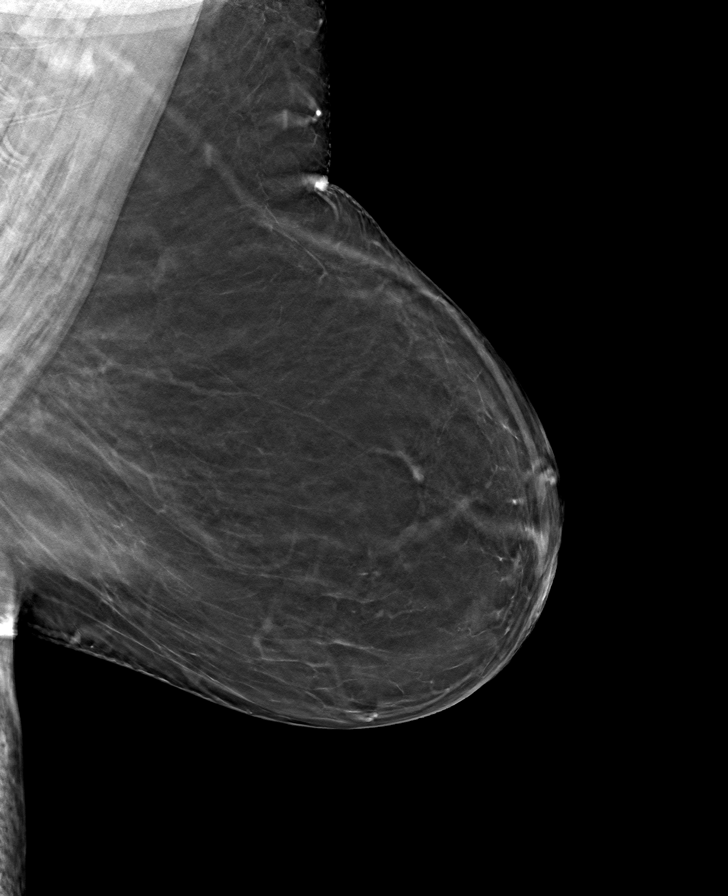

[L CC tomo · tomo slice 29/58.0]
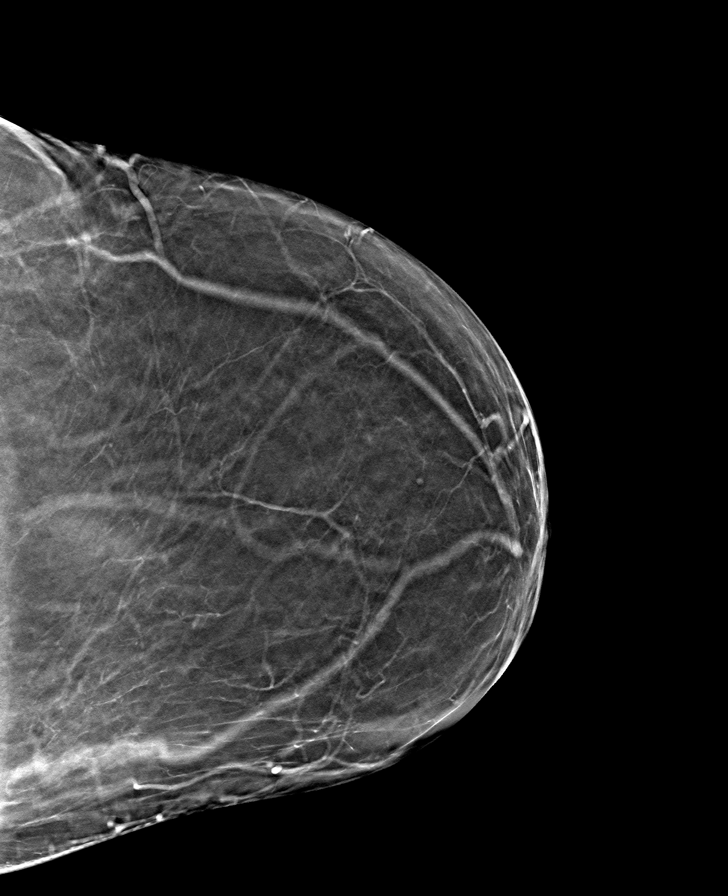

[R CC tomo · tomo slice 27/52.0]
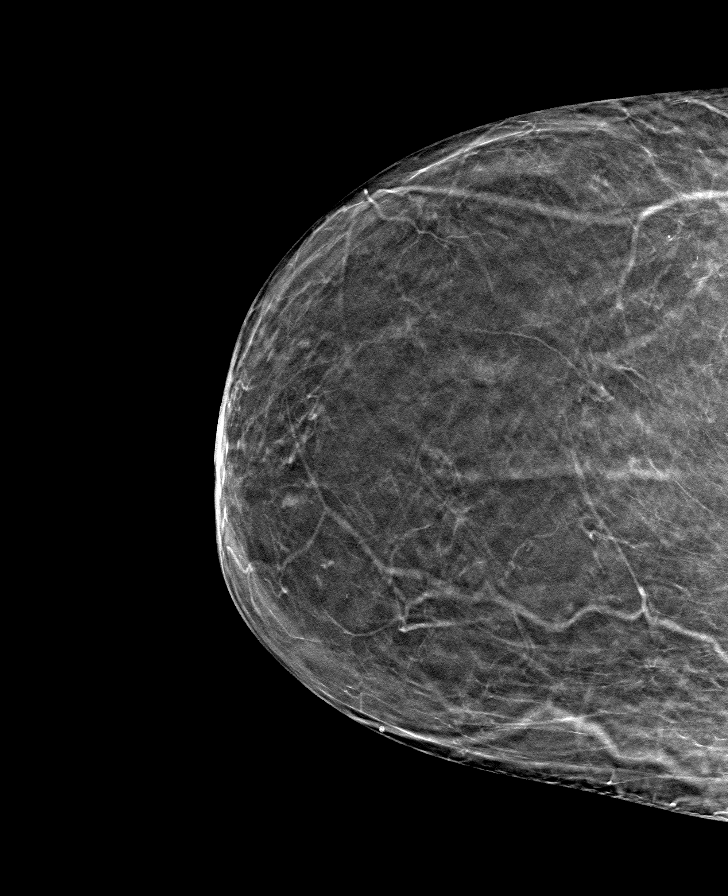

[R MLO tomo · tomo slice 35/68.0]
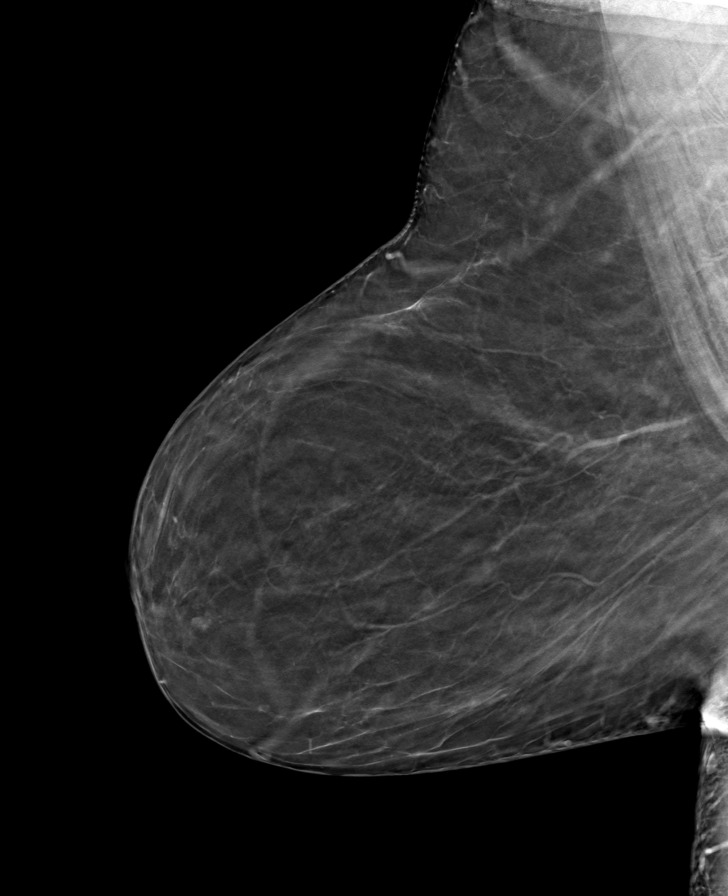

[8 of 24 positions shown; findings below may reference images not displayed]

FINDINGS: There are no findings suspicious for malignancy. Images were
processed with CAD.
IMPRESSION: No mammographic evidence of malignancy. A result letter of this
screening mammogram will be mailed directly to the patient.

RECOMMENDATION:
Screening mammogram in one year. (Code:[TA])

BI-RADS CATEGORY  1: Negative.

## 2019-02-04 ENCOUNTER — Other Ambulatory Visit: Payer: Self-pay | Admitting: Family Medicine

## 2019-02-04 DIAGNOSIS — I10 Essential (primary) hypertension: Secondary | ICD-10-CM

## 2019-02-12 ENCOUNTER — Other Ambulatory Visit: Payer: Self-pay | Admitting: Family Medicine

## 2019-02-12 DIAGNOSIS — G629 Polyneuropathy, unspecified: Secondary | ICD-10-CM

## 2019-02-12 DIAGNOSIS — M792 Neuralgia and neuritis, unspecified: Principal | ICD-10-CM

## 2019-02-26 ENCOUNTER — Encounter: Payer: Self-pay | Admitting: Family Medicine

## 2019-03-03 ENCOUNTER — Other Ambulatory Visit: Payer: Self-pay | Admitting: Family Medicine

## 2019-03-03 DIAGNOSIS — M549 Dorsalgia, unspecified: Principal | ICD-10-CM

## 2019-03-03 DIAGNOSIS — G8929 Other chronic pain: Secondary | ICD-10-CM

## 2019-04-17 ENCOUNTER — Other Ambulatory Visit: Payer: Self-pay | Admitting: Family Medicine

## 2019-04-17 DIAGNOSIS — E039 Hypothyroidism, unspecified: Secondary | ICD-10-CM

## 2019-04-23 ENCOUNTER — Other Ambulatory Visit: Payer: Self-pay | Admitting: Sports Medicine

## 2019-04-24 ENCOUNTER — Other Ambulatory Visit: Payer: Self-pay | Admitting: Sports Medicine

## 2019-04-25 ENCOUNTER — Encounter: Payer: Self-pay | Admitting: Sports Medicine

## 2019-04-25 ENCOUNTER — Ambulatory Visit (INDEPENDENT_AMBULATORY_CARE_PROVIDER_SITE_OTHER): Payer: Medicare PPO | Admitting: Sports Medicine

## 2019-04-25 DIAGNOSIS — M170 Bilateral primary osteoarthritis of knee: Secondary | ICD-10-CM

## 2019-04-25 MED ORDER — MELOXICAM 15 MG PO TABS: 15.0000 mg | ORAL_TABLET | Freq: Every day | ORAL | 1 refills | Status: DC

## 2019-04-25 NOTE — Assessment & Plan Note (Signed)
Doing well with meloxicam, we finished a full series of Orthovisc back in July 2019, she continues to be pain-free. She did have an effusion back in October 2019, we performed an arthrocentesis that was negative for crystals. Refilling meloxicam today, I would like to get renal function at least twice a year if we are going to use meloxicam regularly.

## 2019-04-25 NOTE — Progress Notes (Signed)
Subjective:    CC: Knee osteoarthritis  HPI: [name] is a very pleasant 73 year old female, I been treating her for osteoarthritis, she is done extremely well with meloxicam, needs refill.  I reviewed the past medical history, family history, social history, surgical history, and allergies today and no changes were needed.  Please see the problem list section below in epic for further details.  Past Medical History: Past Medical History:  Diagnosis Date  . Alcohol abuse 04/25/2015  . Allergy   . Anastomotic ulcer S/P gastric bypass 06/18/2016  . Arthritis   . Cataract   . Degenerative disc disease, cervical   . GAD (generalized anxiety disorder) 06/14/2016  . HTN (hypertension) 04/25/2015  . Hypertension   . Hypothyroidism   . 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 Substance abuse (HCC)    Alcohol abuse 2016  . UGIB (upper gastrointestinal bleed) 04/25/2015   Past Surgical History: Past Surgical History:  Procedure Laterality Date  . CHOLECYSTECTOMY    . COSMETIC SURGERY    . DIAGNOSTIC LAPAROSCOPY    . ESOPHAGOGASTRODUODENOSCOPY N/A 04/25/2015   Procedure: ESOPHAGOGASTRODUODENOSCOPY (EGD);  Surgeon: Beverley Fiedler, MD;  Location: [facility] ENDOSCOPY;  Service: Endoscopy;  Laterality: N/A;  . FRACTURE SURGERY    . GASTRIC BYPASS    . NM MYOVIEW LTD  10/10/2017   Normal LV size and function EF 72%.  Medium sized mild severity defect in the apical septal apical lateral and apical wall suggestive of breast attenuation.  LOW RISK.  No ischemia or infarction.    . OPEN REDUCTION INTERNAL FIXATION (ORIF) DISTAL RADIAL FRACTURE Right 01/15/2013   Procedure: OPEN REDUCTION INTERNAL FIXATION (ORIF) Right DISTAL RADIUS FRACTURE;  Surgeon: Eldred Manges, MD;  Location: [facility] OR;  Service: Orthopedics;  Laterality: Right;  . TRANSTHORACIC ECHOCARDIOGRAM  10/04/2017   Mild LVH.  EF 60-65%.  Grade 2/moderate diastolic dysfunction.  Mild pulmonary hypertension.   No valve lesions   Social History: Social History   Socioeconomic History  . Marital status: Married    Spouse name: Not on file  . Number of children: Not on file  . Years of education: Not on file  . Highest education level: Not on file  Occupational History  . Occupation: Charity fundraiser    Comment: ICU Sales executive  Social Needs  . Financial resource strain: Not on file  . Food insecurity:    Worry: Not on file    Inability: Not on file  . Transportation needs:    Medical: Not on file    Non-medical: Not on file  Tobacco Use  . Smoking status: Never Smoker  . Smokeless tobacco: Never Used  Substance and Sexual Activity  . Alcohol use: Yes    Alcohol/week: 28.0 standard drinks    Types: 28 Glasses of wine per week    Comment: drinks daily and has a bottle of wine per day  . Drug use: No  . Sexual activity: Yes    Birth control/protection: Post-menopausal  Lifestyle  . Physical activity:    Days per week: Not on file    Minutes per session: Not on file  . Stress: Not on file  Relationships  . Social connections:    Talks on phone: Not on file    Gets together: Not on file    Attends religious service: Not on file    Active member of club or organization: Not on file    Attends meetings  of clubs or organizations: Not on file    Relationship status: Not on file  Other Topics Concern  . 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 Heart disease Mother   . Stroke Mother [id number]  . Cancer Mother        skin and breast  . Hypertension Mother   . Breast cancer Mother [id number]  . COPD Father   . Cancer Father   . Diabetes Father   . Hypertension Father   . Stroke Maternal Grandmother   . Mental illness Maternal Grandmother   . Breast cancer Sister   . Cancer  Cousin 40   Allergies: Allergies  Allergen Reactions  . Cocoa Anaphylaxis  . Red Dye Hives    Rash and Nausea diarrhea  . Lyrica [Pregabalin] Other (See Comments)    Abnormal bleeding  . Sulfa Antibiotics Rash   Medications: See med rec.  Review of Systems: No fevers, chills, night sweats, weight loss, chest pain, or shortness of breath.   Objective:    General: Well Developed, well nourished, and in no acute distress.  Neuro: Alert and oriented x3, extra-ocular muscles intact, sensation grossly intact.  HEENT: Normocephalic, atraumatic, pupils equal round reactive to light, neck supple, no masses, no lymphadenopathy, thyroid nonpalpable.  Skin: Warm and dry, no rashes. Cardiac: Regular rate and rhythm, no murmurs rubs or gallops, no lower extremity edema.  Respiratory: Clear to auscultation bilaterally. Not using accessory muscles, speaking in full sentences.  Impression and Recommendations:    Primary osteoarthritis of both knees Doing well with meloxicam, we finished a full series of Orthovisc back in July 2019, she continues to be pain-free. She did have an effusion back in October 2019, we performed an arthrocentesis that was negative for crystals. Refilling meloxicam today, I would like to get renal function at least twice a year if we are going to use meloxicam regularly.   ___________________________________________ Ihor Austinhomas J. Benjamin Stainhekkekandam, M.D., ABFM., CAQSM. Primary Care and Sports Medicine [facility]  Adjunct Professor of Family Medicine  [employer]

## 2019-04-29 ENCOUNTER — Telehealth: Payer: Self-pay | Admitting: Family Medicine

## 2019-04-29 NOTE — Telephone Encounter (Signed)
Called patient to set up Provider required OV for any addt'l Rx refill --- left message to cb to establish appt.  --glh

## 2019-04-29 NOTE — Telephone Encounter (Signed)
-----   Message from [place] Crane, CMA sent at 04/17/2019 11:35 AM EDT ----- Patient is due for follow up and fasting labs, please call the patient to make an appointment. 30 days of medication sent to the pharmacy Thanks. MPulliam, CMA/RT(R)

## 2019-05-01 ENCOUNTER — Other Ambulatory Visit (INDEPENDENT_AMBULATORY_CARE_PROVIDER_SITE_OTHER): Payer: Medicare PPO

## 2019-05-01 ENCOUNTER — Other Ambulatory Visit: Payer: Self-pay

## 2019-05-01 DIAGNOSIS — E538 Deficiency of other specified B group vitamins: Secondary | ICD-10-CM

## 2019-05-01 DIAGNOSIS — E781 Pure hyperglyceridemia: Secondary | ICD-10-CM

## 2019-05-01 DIAGNOSIS — E039 Hypothyroidism, unspecified: Secondary | ICD-10-CM

## 2019-05-01 DIAGNOSIS — E559 Vitamin D deficiency, unspecified: Secondary | ICD-10-CM

## 2019-05-01 DIAGNOSIS — I10 Essential (primary) hypertension: Secondary | ICD-10-CM

## 2019-05-01 DIAGNOSIS — D508 Other iron deficiency anemias: Secondary | ICD-10-CM

## 2019-05-02 LAB — CBC WITH DIFFERENTIAL/PLATELET
Basophils Absolute: 0.1 10*3/uL (ref 0.0–0.2)
Basos: 2 %
EOS (ABSOLUTE): 0.3 10*3/uL (ref 0.0–0.4)
Eos: 5 %
Hematocrit: 34.5 % (ref 34.0–46.6)
Hemoglobin: 11.8 g/dL (ref 11.1–15.9)
Immature Grans (Abs): 0 10*3/uL (ref 0.0–0.1)
Immature Granulocytes: 0 %
Lymphocytes Absolute: 2.6 10*3/uL (ref 0.7–3.1)
Lymphs: 40 %
MCH: 36.4 pg — ABNORMAL HIGH (ref 26.6–33.0)
MCHC: 34.2 g/dL (ref 31.5–35.7)
MCV: 107 fL — ABNORMAL HIGH (ref 79–97)
Monocytes Absolute: 0.6 10*3/uL (ref 0.1–0.9)
Monocytes: 9 %
Neutrophils Absolute: 2.9 10*3/uL (ref 1.4–7.0)
Neutrophils: 44 %
Platelets: 242 10*3/uL (ref 150–450)
RBC: 3.24 x10E6/uL — ABNORMAL LOW (ref 3.77–5.28)
RDW: 14 % (ref 11.7–15.4)
WBC: 6.4 10*3/uL (ref 3.4–10.8)

## 2019-05-02 LAB — COMPREHENSIVE METABOLIC PANEL
ALT: 22 IU/L (ref 0–32)
AST: 31 IU/L (ref 0–40)
Albumin/Globulin Ratio: 2 (ref 1.2–2.2)
Albumin: 4 g/dL (ref 3.7–4.7)
Alkaline Phosphatase: 108 IU/L (ref 39–117)
BUN/Creatinine Ratio: 12 (ref 12–28)
BUN: 9 mg/dL (ref 8–27)
Bilirubin Total: 0.6 mg/dL (ref 0.0–1.2)
CO2: 24 mmol/L (ref 20–29)
Calcium: 8.9 mg/dL (ref 8.7–10.3)
Chloride: 94 mmol/L — ABNORMAL LOW (ref 96–106)
Creatinine, Ser: 0.75 mg/dL (ref 0.57–1.00)
GFR calc Af Amer: 92 mL/min/{1.73_m2} (ref >59)
GFR calc non Af Amer: 80 mL/min/{1.73_m2} (ref >59)
Globulin, Total: 2 g/dL (ref 1.5–4.5)
Glucose: 83 mg/dL (ref 65–99)
Potassium: 4.7 mmol/L (ref 3.5–5.2)
Sodium: 131 mmol/L — ABNORMAL LOW (ref 134–144)
Total Protein: 6 g/dL (ref 6.0–8.5)

## 2019-05-02 LAB — LIPID PANEL
Chol/HDL Ratio: 1.8 ratio (ref 0.0–4.4)
Cholesterol, Total: 119 mg/dL (ref 100–199)
HDL: 66 mg/dL (ref >39)
LDL Calculated: 29 mg/dL (ref 0–99)
Triglycerides: 120 mg/dL (ref 0–149)
VLDL Cholesterol Cal: 24 mg/dL (ref 5–40)

## 2019-05-02 LAB — HEMOGLOBIN A1C
Est. average glucose Bld gHb Est-mCnc: 105 mg/dL
Hgb A1c MFr Bld: 5.3 % (ref 4.8–5.6)

## 2019-05-02 LAB — VITAMIN B12: Vitamin B-12: 277 pg/mL (ref 232–1245)

## 2019-05-02 LAB — VITAMIN D 25 HYDROXY (VIT D DEFICIENCY, FRACTURES): Vit D, 25-Hydroxy: 33.1 ng/mL (ref 30.0–100.0)

## 2019-05-02 LAB — T4, FREE: Free T4: 1.2 ng/dL (ref 0.82–1.77)

## 2019-05-02 LAB — TSH: TSH: 0.952 u[IU]/mL (ref 0.450–4.500)

## 2019-05-05 ENCOUNTER — Other Ambulatory Visit: Payer: Self-pay | Admitting: Family Medicine

## 2019-05-08 ENCOUNTER — Other Ambulatory Visit: Payer: Self-pay

## 2019-05-08 ENCOUNTER — Encounter: Payer: Self-pay | Admitting: Family Medicine

## 2019-05-08 ENCOUNTER — Ambulatory Visit (INDEPENDENT_AMBULATORY_CARE_PROVIDER_SITE_OTHER): Payer: Medicare PPO | Admitting: Family Medicine

## 2019-05-08 VITALS — Ht 65.0 in | Wt 255.0 lb

## 2019-05-08 DIAGNOSIS — I10 Essential (primary) hypertension: Secondary | ICD-10-CM | POA: Diagnosis not present

## 2019-05-08 DIAGNOSIS — Z9884 Bariatric surgery status: Secondary | ICD-10-CM

## 2019-05-08 DIAGNOSIS — E538 Deficiency of other specified B group vitamins: Secondary | ICD-10-CM

## 2019-05-08 DIAGNOSIS — M792 Neuralgia and neuritis, unspecified: Secondary | ICD-10-CM | POA: Diagnosis not present

## 2019-05-08 DIAGNOSIS — E039 Hypothyroidism, unspecified: Secondary | ICD-10-CM

## 2019-05-08 DIAGNOSIS — I878 Other specified disorders of veins: Secondary | ICD-10-CM

## 2019-05-08 DIAGNOSIS — E559 Vitamin D deficiency, unspecified: Secondary | ICD-10-CM

## 2019-05-08 DIAGNOSIS — M549 Dorsalgia, unspecified: Secondary | ICD-10-CM

## 2019-05-08 DIAGNOSIS — G629 Polyneuropathy, unspecified: Secondary | ICD-10-CM

## 2019-05-08 DIAGNOSIS — G8929 Other chronic pain: Secondary | ICD-10-CM

## 2019-05-08 MED ORDER — CYCLOBENZAPRINE HCL 10 MG PO TABS: ORAL_TABLET | ORAL | 0 refills | Status: DC

## 2019-05-08 MED ORDER — BENAZEPRIL-HYDROCHLOROTHIAZIDE 20-25 MG PO TABS: 1.0000 | ORAL_TABLET | Freq: Every day | ORAL | 1 refills | Status: DC

## 2019-05-08 MED ORDER — CYANOCOBALAMIN 1000 MCG/ML IJ SOLN: 1000.0000 ug | INTRAMUSCULAR | 1 refills | Status: DC

## 2019-05-08 MED ORDER — GABAPENTIN 300 MG PO CAPS: ORAL_CAPSULE | ORAL | 1 refills | Status: DC

## 2019-05-08 MED ORDER — LEVOTHYROXINE SODIUM 150 MCG PO TABS: 150.0000 ug | ORAL_TABLET | Freq: Every day | ORAL | 1 refills | Status: DC

## 2019-05-08 NOTE — Progress Notes (Signed)
Telehealth office visit note for [name], D.O- at Primary Care at [facility]   I connected with current patient today and verified that I am speaking with the correct person using two identifiers.   . Location of the patient: Home . Location of the provider: Office Only the patient (+/- their family members at pt's discretion) and myself were participating in the encounter    - This visit type was conducted due to national recommendations for restrictions regarding the COVID-19 Pandemic (e.g. social distancing) in an effort to limit this patient's exposure and mitigate transmission in our community.  This format is felt to be most appropriate for this patient at this time.   - The patient did not have access to video technology or had technical difficulties with video requiring transitioning to audio format only. - No physical exam could be performed with this format, beyond that communicated to [place] by the patient/ family members as noted.   - Additionally my office staff/ schedulers discussed with the patient that there may be a monetary charge related to this service, depending on their medical insurance.   The patient expressed understanding, and agreed to proceed.       History of Present Illness:  -  Labs we will be reviewing today.  - Still drinking same amnt of wine- about 1.5 bottle per day.   -not taking her Vit D supp as she should  HTN;   Not checking bp at home, no sx, taking meds regularly  Recent Results (from the past 2160 hour(s))  CBC with Differential/Platelet     Status: Abnormal   Collection Time: 05/01/19  8:43 AM  Result Value Ref Range   WBC 6.4 3.4 - 10.8 x10E3/uL   RBC 3.24 (L) 3.77 - 5.28 x10E6/uL   Hemoglobin 11.8 11.1 - 15.9 g/dL   Hematocrit 34.5 34.0 - 46.6 %   MCV 107 (H) 79 - 97 fL   MCH 36.4 (H) 26.6 - 33.0 pg   MCHC 34.2 31.5 - 35.7 g/dL   RDW 14.0 11.7 - 15.4 %   Platelets 242 150 - 450 x10E3/uL   Neutrophils 44 Not Estab. %    Lymphs 40 Not Estab. %   Monocytes 9 Not Estab. %   Eos 5 Not Estab. %   Basos 2 Not Estab. %   Neutrophils Absolute 2.9 1.4 - 7.0 x10E3/uL   Lymphocytes Absolute 2.6 0.7 - 3.1 x10E3/uL   Monocytes Absolute 0.6 0.1 - 0.9 x10E3/uL   EOS (ABSOLUTE) 0.3 0.0 - 0.4 x10E3/uL   Basophils Absolute 0.1 0.0 - 0.2 x10E3/uL   Immature Granulocytes 0 Not Estab. %   Immature Grans (Abs) 0.0 0.0 - 0.1 x10E3/uL  Comprehensive metabolic panel     Status: Abnormal   Collection Time: 05/01/19  8:43 AM  Result Value Ref Range   Glucose 83 65 - 99 mg/dL   BUN 9 8 - 27 mg/dL   Creatinine, Ser 0.75 0.57 - 1.00 mg/dL   GFR calc non Af Amer 80 >59 mL/min/1.73   GFR calc Af Amer 92 >59 mL/min/1.73   BUN/Creatinine Ratio 12 12 - 28   Sodium 131 (L) 134 - 144 mmol/L   Potassium 4.7 3.5 - 5.2 mmol/L   Chloride 94 (L) 96 - 106 mmol/L   CO2 24 20 - 29 mmol/L   Calcium 8.9 8.7 - 10.3 mg/dL   Total Protein 6.0 6.0 - 8.5 g/dL   Albumin 4.0 3.7 -  4.7 g/dL   Globulin, Total 2.0 1.5 - 4.5 g/dL   Albumin/Globulin Ratio 2.0 1.2 - 2.2   Bilirubin Total 0.6 0.0 - 1.2 mg/dL   Alkaline Phosphatase 108 39 - 117 IU/L   AST 31 0 - 40 IU/L   ALT 22 0 - 32 IU/L  Hemoglobin A1c     Status: None   Collection Time: 05/01/19  8:43 AM  Result Value Ref Range   Hgb A1c MFr Bld 5.3 4.8 - 5.6 %    Comment:          Prediabetes: 5.7 - 6.4          Diabetes: >6.4          Glycemic control for adults with diabetes: <7.0    Est. average glucose Bld gHb Est-mCnc 105 mg/dL  Lipid panel     Status: None   Collection Time: 05/01/19  8:43 AM  Result Value Ref Range   Cholesterol, Total 119 100 - 199 mg/dL   Triglycerides 960120 0 - 149 mg/dL   HDL 66 >45>39 mg/dL   VLDL Cholesterol Cal 24 5 - 40 mg/dL   LDL Calculated 29 0 - 99 mg/dL   Chol/HDL Ratio 1.8 0.0 - 4.4 ratio    Comment:                                   T. Chol/HDL Ratio                                             Men  Women                               1/2 Avg.Risk   3.4    3.3                                   Avg.Risk  5.0    4.4                                2X Avg.Risk  9.6    7.1                                3X Avg.Risk 23.4   11.0   VITAMIN D 25 Hydroxy (Vit-D Deficiency, Fractures)     Status: None   Collection Time: 05/01/19  8:43 AM  Result Value Ref Range   Vit D, 25-Hydroxy 33.1 30.0 - 100.0 ng/mL    Comment: Vitamin D deficiency has been defined by the Institute of Medicine and an Endocrine Society practice guideline as a level of serum 25-OH vitamin D less than 20 ng/mL (1,2). The Endocrine Society went on to further define vitamin D insufficiency as a level between 21 and 29 ng/mL (2). 1. IOM (Institute of Medicine). 2010. Dietary reference    intakes for calcium and D. [place]: The    [employer]. 2. Holick MF, Binkley [place], Bischoff-Ferrari HA, et al.    Evaluation, treatment, and prevention of vitamin D    deficiency: an Endocrine  Society clinical practice    guideline. JCEM. 2011 Jul; 96(7):1911-30.   TSH     Status: None   Collection Time: 05/01/19  8:43 AM  Result Value Ref Range   TSH 0.952 0.450 - 4.500 uIU/mL  T4, free     Status: None   Collection Time: 05/01/19  8:43 AM  Result Value Ref Range   Free T4 1.20 0.82 - 1.77 ng/dL  Vitamin [id number]     Status: None   Collection Time: 05/01/19  8:43 AM  Result Value Ref Range   Vitamin B-12 277 232 - 1,245 pg/mL     B/l feet have been more painful and her neuropathy has flared the past couple months.  btm of feet are worse- "knives and needles sensation",  top in numb  - Pain improves with movement she has a 3800 sq house   Impression and Recommendations:    1. Essential hypertension   2. Peripheral neuropathy due to edema b/l feet   3. Chronic back pain, unspecified back location, unspecified back pain laterality   4. Hypothyroidism, unspecified type   5. H/O gastric bypass   6. Vitamin D deficiency   7. B12 deficiency   8. Chronic venous stasis  of both lower extremities     -Needs to check BP every couple days at home.  They ordered a monitor and it is coming.  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  -Patient goal: Dec ETOH intake to ONE BOTTLE per day  -We will increase Neurontin dose from 900 3 times daily to 1200 every morning, 900 q. afternoon and 1200 every afternoon.  Patient understands the importance of cutting back on alcohol, exercising etc. to improve her symptoms as well.  -Be more consistent with your vitamin D supplements.  Levels are still in the low normal range.  Sun exposure 10 to 15 minutes daily with help greatly as well.  -Hypothyroidism: Continue current meds at current dose.  Feels well and labs within normal limits.  Continue to monitor  - get up and walk more- less walking   -B12 level still a little on the low side.  Patient states she has been very consistently taking it every 30 days.  We will go to every 3 weeks.  Patient history of gastric bypass and deficiency.   --> Along with her excessive alcohol intake, I want to make sure her B12 is normalized so she does not end up with cerebral issues   - As part of my medical decision making, I reviewed the following data within the electronic MEDICAL RECORD NUMBER History obtained from pt /family, CMA notes reviewed and incorporated if applicable, Labs reviewed, Radiograph/ tests reviewed if applicable and OV notes from prior OV's with me, as well as other specialists she/he has seen since seeing me last, were all reviewed and used in my medical decision making process today.   - Additionally, discussion had with patient regarding txmnt plan, and their biases/concerns about that plan were used in my medical decision making today.   - The patient agreed with the plan and demonstrated an understanding of the instructions.   No barriers to understanding were identified.   - Red flag symptoms and signs discussed in detail.  Patient expressed  understanding regarding what to do in case of emergency\ urgent symptoms.  The patient was advised to call back or seek an in-person evaluation if the symptoms worsen or if the condition fails to improve as anticipated.  Return for 3 mo- f/up lab- only for b12 level prior to her injection; inc neurontin - check bp's at home..    No orders of the defined types were placed in this encounter.   Meds ordered this encounter  Medications  . cyclobenzaprine (FLEXERIL) 10 MG tablet    Sig: TAKE 1 TABLET BY MOUTH 3 TIMES DAILY AS NEEDED FOR MUSCLE SPASMS.    Dispense:  30 tablet    Refill:  0  . benazepril-hydrochlorthiazide (LOTENSIN HCT) 20-25 MG tablet    Sig: Take 1 tablet by mouth daily.    Dispense:  90 tablet    Refill:  1  . levothyroxine (SYNTHROID) 150 MCG tablet    Sig: Take 1 tablet (150 mcg total) by mouth daily.    Dispense:  90 tablet    Refill:  1  . gabapentin (NEURONTIN) 300 MG capsule    Sig: Patient will take 4 tabs q am, 3 tabs q. afternoon, 4 tabs nightly    Dispense:  330 capsule    Refill:  1  . cyanocobalamin (,VITAMIN B-12,) 1000 MCG/ML injection    Sig: Inject 1 mL (1,000 mcg total) into the muscle every 21 ( twenty-one) days.    Dispense:  7 mL    Refill:  1    Medications Discontinued During This Encounter  Medication Reason  . cyanocobalamin (,VITAMIN B-12,) 1000 MCG/ML injection   . benazepril-hydrochlorthiazide (LOTENSIN HCT) 20-25 MG tablet Reorder  . gabapentin (NEURONTIN) 300 MG capsule   . cyclobenzaprine (FLEXERIL) 10 MG tablet Reorder  . levothyroxine (SYNTHROID) 150 MCG tablet Reorder      I provided 21+ minutes of non-face-to-face time during this encounter,with over 50% of the time in direct counseling on patients medical conditions/ medical concerns.  Additional time was spent with charting and coordination of care after the actual visit commenced.   Note:  This note was prepared with assistance of Dragon voice recognition software.  Occasional wrong-word or sound-a-like substitutions may have occurred due to the inherent limitations of voice recognition software.  Thomasene Lot, DO     Patient Care Team    Relationship Specialty Notifications Start End  Thomasene Lot, DO PCP - General Family Medicine  06/14/16   Marykay Lex, MD PCP - Cardiology Cardiology Admissions 12/03/18   Monica Becton, MD Consulting Physician Sports Medicine  11/08/16    Comment: OA- knee injections; PT  Jerolyn Shin, MD Consulting Physician Neurology  09/25/17    Comment: neuropsych- seen him for pinched nerves in neck in past  Marykay Lex, MD Consulting Physician Cardiology  12/03/17   Jeani Hawking, MD Consulting Physician Gastroenterology  02/26/19      -Vitals obtained; medications/ allergies reconciled;  personal medical, social, Sx etc.histories were updated by CMA, reviewed by me and are reflected in chart   Patient Active Problem List   Diagnosis Date Noted  . Hypertriglyceridemia 09/29/2016    Priority: High  . HLD (hyperlipidemia) 09/29/2016    Priority: High  . Adjustment disorder with mixed anxiety and depressed mood 09/29/2016    Priority: High  . Morbid obesity (HCC) 06/18/2016    Priority: High  . R Congenital Kidney ABN: Ess only has one fxn kidney 06/18/2016    Priority: High  . Hypertension 04/25/2015    Priority: High  . Alcohol abuse-with elevated ALT and AST 04/25/2015    Priority: High  . H/O gastric bypass 06/18/2016    Priority: Medium  . Peripheral edema- b/l L  Ext w chronic skin changes 06/18/2016    Priority: Medium  . GAD (generalized anxiety disorder) 06/14/2016    Priority: Medium  . UGIB (upper gastrointestinal bleed) 04/25/2015    Priority: Medium  . Acute ulcer at gastrojejunal region- post gastric bypass     Priority: Medium  . Vitamin D deficiency 09/29/2016    Priority: Low  . h/o Iron deficiency anemia 06/14/2016    Priority: Low  . Hypothyroidism 02/13/2016     Priority: Low  . Venous stasis of both lower extremities 01/10/2019  . Weakness generalized 09/09/2018  . Osteoarthritis of knee 07/04/2018  . Chronic venous stasis dermatitis of both lower extremities 03/11/2018  . B12 deficiency 03/04/2018  . Cellulitis of right leg 12/18/2017  . Peripheral neuropathy due to edema b/l feet 12/03/2017  . Decreased pedal pulses 10/27/2017  . Diastolic dysfunction with chronic heart failure (HCC) 10/25/2017  . Aortic systolic murmur on examination 10/02/2017  . Preoperative cardiovascular examination 10/02/2017  . Exertional dyspnea 09/25/2017  . Bilateral lower extremity edema 11/07/2016  . Primary osteoarthritis of both knees 11/02/2016  . History of hysterectomy 06/18/2016  . Insomnia due to alcohol excess and stress 06/18/2016  . Muscle spasm of back 06/14/2016  . Stress at home 02/13/2016  . Distal radius fracture, right 01/15/2013     Current Meds  Medication Sig  . acetaminophen (TYLENOL) 500 MG tablet Take 1,000 mg by mouth daily.   Marland Kitchen. atorvastatin (LIPITOR) 80 MG tablet Take 1 tablet (80 mg total) by mouth at bedtime.  . benazepril-hydrochlorthiazide (LOTENSIN HCT) 20-25 MG tablet Take 1 tablet by mouth daily.  . carvedilol (COREG) 6.25 MG tablet Take 1 tablet (6.25 mg total) by mouth 2 (two) times daily with a meal.  . Cholecalciferol (VITAMIN D3) 5000 units TABS 5,000 IU OTC vitamin D3 daily. (Patient taking differently: Take 15,000 Units by mouth daily. 5,000 IU OTC vitamin D3 daily.)  . cyanocobalamin (,VITAMIN B-12,) 1000 MCG/ML injection Inject 1 mL (1,000 mcg total) into the muscle every 21 ( twenty-one) days.  . cyclobenzaprine (FLEXERIL) 10 MG tablet TAKE 1 TABLET BY MOUTH 3 TIMES DAILY AS NEEDED FOR MUSCLE SPASMS.  Marland Kitchen. diphenhydrAMINE (BENADRYL) 25 MG tablet Take 25 mg by mouth at bedtime as needed.   . ferrous sulfate 325 (65 FE) MG tablet Take 325 mg by mouth daily with breakfast.  . furosemide (LASIX) 40 MG tablet Take 0.5 tablets  (20 mg total) by mouth daily.  Marland Kitchen. gabapentin (NEURONTIN) 300 MG capsule Patient will take 4 tabs q am, 3 tabs q. afternoon, 4 tabs nightly  . levothyroxine (SYNTHROID) 150 MCG tablet Take 1 tablet (150 mcg total) by mouth daily.  . magnesium 30 MG tablet Take 30 mg by mouth 2 (two) times a week. Patient takes prn and takes 1 gram  . Melatonin 5 MG CAPS Take 10 mg by mouth at bedtime.   . meloxicam (MOBIC) 15 MG tablet Take 1 tablet (15 mg total) by mouth daily.  . Multiple Vitamin (MULTIVITAMIN) tablet Take 1 tablet by mouth daily.  . mupirocin ointment (BACTROBAN) 2 % Apply to affected wounds twice daily.  . pantoprazole (PROTONIX) 40 MG tablet TAKE 1 TABLET BY MOUTH DAILY.  . traMADol (ULTRAM) 50 MG tablet TAKE 1 TABLET BY MOUTH 2 TIMES DAILY. MAXIMUM 6 TABS PER DAY.  . [DISCONTINUED] benazepril-hydrochlorthiazide (LOTENSIN HCT) 20-25 MG tablet TAKE 1 TABLET BY MOUTH DAILY.  . [DISCONTINUED] cyanocobalamin (,VITAMIN B-12,) 1000 MCG/ML injection INJECT 1 ML INTO THE MUSCLE EVERY  30 DAYS.  . [DISCONTINUED] cyclobenzaprine (FLEXERIL) 10 MG tablet TAKE 1 TABLET BY MOUTH 3 TIMES DAILY AS NEEDED FOR MUSCLE SPASMS.  . [DISCONTINUED] gabapentin (NEURONTIN) 300 MG capsule TAKE 3 CAPSULES BY MOUTH 3 TIMES DAILY.  . [DISCONTINUED] levothyroxine (SYNTHROID) 150 MCG tablet Take 1 tablet (150 mcg total) by mouth daily. Patient needs office visit for refills     Allergies:  Allergies  Allergen Reactions  . Cocoa Anaphylaxis  . Red Dye Hives    Rash and Nausea diarrhea  . Lyrica [Pregabalin] Other (See Comments)    Abnormal bleeding  . Sulfa Antibiotics Rash     ROS:  See above HPI for pertinent positives and negatives   Objective:   Height  (1.651 m), weight 255 lb (115.7 kg).  (if some vitals are omitted, this means that patient was UNABLE to obtain them even though they were asked to get them prior to OV today.  They were asked to call us at their earliest convenience with these once  obtained. )  General: A & O * 3; sounds in no acute distress; in usual state of health.  Skin: Pt confirms warm and dry extremities and pink fingertips HEENT: Pt confirms lips non-cyanotic Chest: Patient confirms normal chest excursion and movement Respiratory: speaking in full sentences, no conversational dyspnea; patient confirms no use of accessory muscles Psych: insight appears good, mood- appears full

## 2019-05-14 ENCOUNTER — Other Ambulatory Visit: Payer: Self-pay | Admitting: Family Medicine

## 2019-05-14 DIAGNOSIS — G629 Polyneuropathy, unspecified: Secondary | ICD-10-CM

## 2019-05-14 DIAGNOSIS — M792 Neuralgia and neuritis, unspecified: Secondary | ICD-10-CM

## 2019-07-23 ENCOUNTER — Other Ambulatory Visit: Payer: Self-pay | Admitting: Cardiology

## 2019-07-30 ENCOUNTER — Other Ambulatory Visit: Payer: Self-pay | Admitting: Family Medicine

## 2019-07-30 DIAGNOSIS — E782 Mixed hyperlipidemia: Secondary | ICD-10-CM

## 2019-08-05 ENCOUNTER — Telehealth: Payer: Self-pay

## 2019-08-05 ENCOUNTER — Other Ambulatory Visit: Payer: Self-pay | Admitting: Family Medicine

## 2019-08-05 DIAGNOSIS — G629 Polyneuropathy, unspecified: Secondary | ICD-10-CM

## 2019-08-05 NOTE — Telephone Encounter (Signed)
Please call pt to schedule f/u.  No further refills until pt is seen.  T. Lamara Brecht, CMA 

## 2019-08-19 ENCOUNTER — Other Ambulatory Visit: Payer: Self-pay | Admitting: Sports Medicine

## 2019-08-19 DIAGNOSIS — M170 Bilateral primary osteoarthritis of knee: Secondary | ICD-10-CM

## 2019-08-20 ENCOUNTER — Telehealth: Payer: Self-pay | Admitting: Family Medicine

## 2019-08-20 NOTE — Telephone Encounter (Signed)
Called pt to set up provider required [place] for Rx refills--- Left message for her to call office.  --- FYI to medical asst  --glh

## 2019-09-02 ENCOUNTER — Ambulatory Visit (INDEPENDENT_AMBULATORY_CARE_PROVIDER_SITE_OTHER): Payer: Medicare PPO | Admitting: Family Medicine

## 2019-09-02 ENCOUNTER — Other Ambulatory Visit: Payer: Self-pay

## 2019-09-02 ENCOUNTER — Encounter: Payer: Self-pay | Admitting: Family Medicine

## 2019-09-02 DIAGNOSIS — E538 Deficiency of other specified B group vitamins: Secondary | ICD-10-CM | POA: Diagnosis not present

## 2019-09-02 DIAGNOSIS — M792 Neuralgia and neuritis, unspecified: Secondary | ICD-10-CM

## 2019-09-02 DIAGNOSIS — F1010 Alcohol abuse, uncomplicated: Secondary | ICD-10-CM

## 2019-09-02 DIAGNOSIS — R5383 Other fatigue: Secondary | ICD-10-CM | POA: Diagnosis not present

## 2019-09-02 DIAGNOSIS — I10 Essential (primary) hypertension: Secondary | ICD-10-CM | POA: Diagnosis not present

## 2019-09-02 DIAGNOSIS — S37001D Unspecified injury of right kidney, subsequent encounter: Secondary | ICD-10-CM

## 2019-09-02 DIAGNOSIS — G629 Polyneuropathy, unspecified: Secondary | ICD-10-CM

## 2019-09-02 DIAGNOSIS — E559 Vitamin D deficiency, unspecified: Secondary | ICD-10-CM | POA: Diagnosis not present

## 2019-09-02 DIAGNOSIS — Z9884 Bariatric surgery status: Secondary | ICD-10-CM | POA: Diagnosis not present

## 2019-09-02 DIAGNOSIS — R748 Abnormal levels of other serum enzymes: Secondary | ICD-10-CM

## 2019-09-02 DIAGNOSIS — F4323 Adjustment disorder with mixed anxiety and depressed mood: Secondary | ICD-10-CM

## 2019-09-02 DIAGNOSIS — E039 Hypothyroidism, unspecified: Secondary | ICD-10-CM

## 2019-09-02 DIAGNOSIS — L659 Nonscarring hair loss, unspecified: Secondary | ICD-10-CM | POA: Diagnosis not present

## 2019-09-02 NOTE — Progress Notes (Signed)
Telehealth office visit note for [name], D.O- at [facility]   I connected with current patient today and verified that I am speaking with the correct person using two identifiers.   . Location of the patient: Home . Location of the provider: Office Only the patient (+/- their family members at pt's discretion) and myself were participating in the encounter - This visit type was conducted due to national recommendations for restrictions regarding the COVID-19 Pandemic (e.g. social distancing) in an effort to limit this patient's exposure and mitigate transmission in our community.  This format is felt to be most appropriate for this patient at this time.   - The patient did not have access to video technology or had technical difficulties with video requiring transitioning to audio format only. - No physical exam could be performed with this format, beyond that communicated to us by the patient/ family members as noted.   - Additionally my office staff/ schedulers discussed with the patient that there may be a monetary charge related to this service, depending on their medical insurance.   The patient expressed understanding, and agreed to proceed.       History of Present Illness:   States "I hardly get out at all; I'm limiting my exposures to everything.  - Lifestyle Denies new stress in life; states "it's very un-stressful, everything is actually very smooth."  Her husband retired in July.  States he got a really good early retirement package and "everything's been really nice."  Notes he stays very busy and "has taken over almost all the housework and wanting to learn to do more in the kitchen, but that's my turf."  Notes her husband has been going out and walking, but she has not.  - Fatigue, Hair Loss States she has been feeling sleepy, unfocused, fatigued; has been having these symptoms for weeks and also notes her hair is falling out.  Feels the  hair loss is much more than usual.  Coming in for labs tomorrow morning and wonders if her thyroid panel needs to be done again.  Notes "they're trying to increase my protein intake."  - Alcohol Use Says "not drinking more, drinking slightly less."  Confirms drinking "about a bottle" per night.  Says she usually only opens a second bottle with a "special meal."  Says "cooking is one of my big hobbies," and thinks she opens a second bottle three times per week.  - Peripheral Neuropathy due to Edema of B/L Feet Had to reduce her dose of Neurontin.  Notes "it gives me loose stools and it was just diarrhea I couldn't control or deal with.  States the neuropathy isn't great but "I can't deal with diarrhea."  Notes "I couldn't even leave the house."  She has tried Lyrica in the past.  Notes she had nosebleeds as a side-effect that were sending her to the emergency room.  - Statin Management & Concerns about Balance Says "a little bit concerned about Lipitor, because Lipitor contributes to imbalance, and my balance isn't all that hot."  Notes "the times I've forgotten to take it, I've seemed to be [balanced] a little better."  States that her balance is directly related to her tinnitus, and "when the tinnitus is really bad, the balance is really difficult."  - B12 Injections Notes "tomorrow would be the day I would give myself a shot."  Confirms continues injections every three weeks instead of once monthly.  -  Blood Pressure at home BP today was 144/98.  Notes didn't check her BP after this.  Notes "that was the first time I took it" in the past four months.  States "I got a really cheap cuff" and doesn't trust it.    GAD 7 : Generalized Anxiety Score 05/08/2019 04/17/2017  Nervous, Anxious, on Edge 0 1  Control/stop worrying 0 0  Worry too much - different things 0 1  Trouble relaxing 0 0  Restless 0 0  Easily annoyed or irritable 0 1  Afraid - awful might happen 0 0  Total GAD 7  Score 0 3  Anxiety Difficulty Not difficult at all Not difficult at all    Depression screen [facility] 2/9 05/08/2019 01/13/2019 11/04/2018 09/09/2018 07/04/2018  Decreased Interest 0 0 0 0 0  Down, Depressed, Hopeless 0 1 0 1 0  PHQ - 2 Score 0 1 0 1 0  Altered sleeping Tired, decreased energy 0 0  Change in appetite 0 0 1 1 0  Feeling bad or failure about yourself  0 0 0 0 0  Trouble concentrating 0 0 0 1 0  Moving slowly or fidgety/restless 0 0 0 2 0  Suicidal thoughts 0 0 0 0 0  PHQ-9 Score Difficult doing work/chores Not difficult at all Not difficult at all Not difficult at all Somewhat difficult -  Some recent data might be hidden      Impression and Recommendations:    1. Fatigue, unspecified type   2. Hair loss   3. Adjustment disorder with mixed anxiety and depressed mood   4. Morbid obesity (HCC)   5. Injury of right kidney, subsequent encounter   6. Essential hypertension   7. Alcohol abuse-with elevated ALT and AST   8. H/O gastric bypass   9. B12 deficiency   10. Vitamin D deficiency   11. Peripheral neuropathy due to edema b/l feet   12. Elevated liver enzymes   13. Hypothyroidism, unspecified type       Hair Loss, Fatigue - Need for Lab Work for Multiple Medical Conditions - Last labs drawn 05/01/2019. - At that time, patient was borderline anemic. - Need for lab re-check of several labs tomorrow.  See orders.  - Patient knows to continue B12 injections as scheduled.  - Will continue to monitor labs and review as recommended. - If all labs are WNL, advised that patient symptoms could be due to increased sedentary lifestyle.  - Will continue to monitor.   Alcohol Abuse w/ Elevated ALT & AST - Per pt continues drinking at least one bottle of wine per night. - STRONGLY encouraged patient to reduce alcohol consumption. - Educated patient that feelings of fatigue can be caused by alcohol overuse. - Labs drawn tomorrow.  See orders.  - Will continue to monitor.   Peripheral Neuropathy due to Edema of B/L Feet - Patient decreased dose of Neurontin due to S-E of diarrhea. - Per pt, cannot tolerate Lyrica due to nosebleeds.  - Continue management on Neurontin as established.  - Will continue to monitor.   Essential Hypertension - Has not been checking her BP at home. - Patient states "I wonder if I could get my blood pressure checked in office tomorrow."  - Continue management as prescribed.  - Strongly advised patient to obtain a blood pressure cuff for home blood pressure monitoring. - Recommended the OMRON series 3 or  comparable instrument.  - Ambulatory blood pressure STRONGLY monitoring encouraged at least 3 times weekly.  Keep log and bring in every office visit.  Reminded patient that if she ever feels poorly in any way, to check blood pressure and pulse.  - Counseled patient on pathophysiology of disease and discussed various treatment options, which always includes dietary and lifestyle modification as first line.   - Lifestyle changes such as dash and heart healthy diets and engaging in a regular exercise program discussed extensively with patient.   - We will continue to monitor.   Hyperlipidemia - Managed on Statin - Advised patient about S-E of statin usage. - Extensive education provided and all questions answered.  - Per pt, concerned that use of Lipitor may be affecting her balance. - Advised patient to assess possible triggers to her sx/concerns. - Advised patient to identify whether it is her tinnitus, neuropathy, or truly the statin causing imbalance.  - STRONGLY advised increased physical activity.  - Otherwise, continue statin as prescribed and call in to clinic with further concerns.  - Return in 3 months for follow-up.   BMI Counseling - Morbid Obesity Explained to patient what BMI refers to, and what it means medically.    Told patient to think about it as a "medical risk  stratification measurement" and how increasing BMI is associated with increasing risk/ or worsening state of various diseases such as hypertension, hyperlipidemia, diabetes, premature OA, depression etc.  American Heart Association guidelines for healthy diet, basically Mediterranean diet, and exercise guidelines of 30 minutes 5 days per week or more discussed in detail.  Health counseling performed.  All questions answered.  Lifestyle & Preventative Health Maintenance - Advised patient to continue working toward exercising to improve overall mental, physical, and emotional health.    - Reviewed the "spokes of the wheel" of mood and health management.  Stressed the importance of ongoing prudent habits, including regular exercise, appropriate sleep hygiene, healthful dietary habits, and prayer/meditation to relax.  - Encouraged patient to engage in daily physical activity as tolerated, especially a formal exercise routine.  Recommended that the patient eventually strive for at least 150 minutes of moderate cardiovascular activity per week according to guidelines established by the [facility].   - Healthy dietary habits encouraged, including low-carb, and high amounts of lean protein in diet.   - Patient should also consume adequate amounts of water.    - As part of my medical decision making, I reviewed the following data within the [place] History obtained from pt /family, CMA notes reviewed and incorporated if applicable, Labs reviewed, Radiograph/ tests reviewed if applicable and OV notes from prior OV's with me, as well as other specialists she/he has seen since seeing me last, were all reviewed and used in my medical decision making process today.    - Additionally, discussion had with patient regarding our treatment plan, and their biases/concerns about that plan were used in my medical decision making today.    - The patient agreed with the plan and demonstrated an  understanding of the instructions.   No barriers to understanding were identified.    - Red flag symptoms and signs discussed in detail.  Patient expressed understanding regarding what to do in case of emergency\ urgent symptoms.   - The patient was advised to call back or seek an in-person evaluation if the symptoms worsen or if the condition fails to improve as anticipated.   Return for 3-4 months or sooner if  sx worsen or continue.    Orders Placed This Encounter  Procedures  . Vitamin B1  . Vitamin B12  . Folate  . Iron and TIBC  . Ferritin  . Comprehensive metabolic panel  . TSH  . T4, free  . T3  . Magnesium  . Phosphorus  . VITAMIN D 25 Hydroxy (Vit-D Deficiency, Fractures)  . Vitamin B6    Medications Discontinued During This Encounter  Medication Reason  . magnesium 30 MG tablet Completed Course  . Multiple Vitamin (MULTIVITAMIN) tablet Completed Course     I provided 23 minutes of non face-to-face time during this encounter.  Additional time was spent with charting and coordination of care after the actual visit commenced.   Note:  This note was prepared with assistance of Dragon voice recognition software. Occasional wrong-word or sound-a-like substitutions may have occurred due to the inherent limitations of voice recognition software.   This document serves as a record of services personally performed by [name] Lot, DO. It was created on her behalf by Peggye Fothergill, a trained medical scribe. The creation of this record is based on the scribe's personal observations and the provider's statements to them.   I have reviewed the above medical documentation for accuracy and completeness and I concur.  [name] Lot, DO 09/02/2019 4:25 PM       Patient Care Team    Relationship Specialty Notifications Start End  [name] Lot, DO PCP - General Family Medicine  06/14/16   Marykay Lex, MD PCP - Cardiology Cardiology Admissions 12/03/18    Monica Becton, MD Consulting Physician Sports Medicine  11/08/16    Comment: OA- knee injections; PT  Jerolyn Shin, MD Consulting Physician Neurology  09/25/17    Comment: neuropsych- seen him for pinched nerves in neck in past  Marykay Lex, MD Consulting Physician Cardiology  12/03/17   Jeani Hawking, MD Consulting Physician Gastroenterology  02/26/19      -Vitals obtained; medications/ allergies reconciled;  personal medical, social, Sx etc.histories were updated by CMA, reviewed by me and are reflected in chart   Patient Active Problem List   Diagnosis Date Noted  . Hypertriglyceridemia 09/29/2016    Priority: High  . HLD (hyperlipidemia) 09/29/2016    Priority: High  . Adjustment disorder with mixed anxiety and depressed mood 09/29/2016    Priority: High  . Morbid obesity (HCC) 06/18/2016    Priority: High  . R Congenital Kidney ABN: Ess only has one fxn kidney 06/18/2016    Priority: High  . Hypertension 04/25/2015    Priority: High  . Alcohol abuse-with elevated ALT and AST 04/25/2015    Priority: High  . H/O gastric bypass 06/18/2016    Priority: Medium  . Peripheral edema- b/l L Ext w chronic skin changes 06/18/2016    Priority: Medium  . GAD (generalized anxiety disorder) 06/14/2016    Priority: Medium  . UGIB (upper gastrointestinal bleed) 04/25/2015    Priority: Medium  . Acute ulcer at gastrojejunal region- post gastric bypass     Priority: Medium  . Vitamin D deficiency 09/29/2016    Priority: Low  . h/o Iron deficiency anemia 06/14/2016    Priority: Low  . Hypothyroidism 02/13/2016    Priority: Low  . Venous stasis of both lower extremities 01/10/2019  . Weakness generalized 09/09/2018  . Osteoarthritis of knee 07/04/2018  . Chronic venous stasis dermatitis of both lower extremities 03/11/2018  . B12 deficiency 03/04/2018  . Cellulitis of right leg 12/18/2017  .  Peripheral neuropathy due to edema b/l feet 12/03/2017  . Decreased pedal  pulses 10/27/2017  . Diastolic dysfunction with chronic heart failure (HCC) 10/25/2017  . Aortic systolic murmur on examination 10/02/2017  . Preoperative cardiovascular examination 10/02/2017  . Exertional dyspnea 09/25/2017  . Bilateral lower extremity edema 11/07/2016  . Primary osteoarthritis of both knees 11/02/2016  . History of hysterectomy 06/18/2016  . Insomnia due to alcohol excess and stress 06/18/2016  . Muscle spasm of back 06/14/2016  . Stress at home 02/13/2016  . Distal radius fracture, right 01/15/2013     Current Meds  Medication Sig  . acetaminophen (TYLENOL) 500 MG tablet Take 1,000 mg by mouth daily.   Marland Kitchen atorvastatin (LIPITOR) 80 MG tablet TAKE 1 TABLET BY MOUTH AT BEDTIME.  . benazepril-hydrochlorthiazide (LOTENSIN HCT) 20-25 MG tablet Take 1 tablet by mouth daily.  . carvedilol (COREG) 6.25 MG tablet TAKE 1 TABLET BY MOUTH 2 TIMES DAILY WITH A MEAL.  Marland Kitchen Cholecalciferol (VITAMIN D3) 5000 units TABS 5,000 IU OTC vitamin D3 daily. (Patient taking differently: Take 15,000 Units by mouth daily. 5,000 IU OTC vitamin D3 daily.)  . cyanocobalamin (,VITAMIN B-12,) 1000 MCG/ML injection Inject 1 mL (1,000 mcg total) into the muscle every 21 ( twenty-one) days.  . cyclobenzaprine (FLEXERIL) 10 MG tablet TAKE 1 TABLET BY MOUTH 3 TIMES DAILY AS NEEDED FOR MUSCLE SPASMS.  Marland Kitchen diphenhydrAMINE (BENADRYL) 25 MG tablet Take 25 mg by mouth at bedtime as needed.   . ferrous sulfate 325 (65 FE) MG tablet Take 325 mg by mouth daily with breakfast.  . furosemide (LASIX) 40 MG tablet Take 0.5 tablets (20 mg total) by mouth daily.  Marland Kitchen gabapentin (NEURONTIN) 300 MG capsule TAKE 4 CAPSULES BY MOUTH IN THE MORNING, 3 CAPSULES IN THE AFTERNOON, AND 4 CAPUSLES AT NIGHT.  PATIENT NEEDS OFFICE VISIT PRIOR TO ANY FURTHER REFILLS  . levothyroxine (SYNTHROID) 150 MCG tablet Take 1 tablet (150 mcg total) by mouth daily.  . Melatonin 5 MG CAPS Take 10 mg by mouth at bedtime.   . meloxicam (MOBIC) 15 MG  tablet Take 1 tablet (15 mg total) by mouth daily.  . mupirocin ointment (BACTROBAN) 2 % Apply to affected wounds twice daily.  . pantoprazole (PROTONIX) 40 MG tablet TAKE 1 TABLET BY MOUTH DAILY.  . traMADol (ULTRAM) 50 MG tablet TAKE 1 TABLET BY MOUTH 2 TIMES DAILY. MAXIMUM 6 TABS PER DAY.  . [DISCONTINUED] magnesium 30 MG tablet Take 30 mg by mouth 2 (two) times a week. Patient takes prn and takes 1 gram     Allergies:  Allergies  Allergen Reactions  . Cocoa Anaphylaxis  . Red Dye Hives    Rash and Nausea diarrhea  . Lyrica [Pregabalin] Other (See Comments)    Abnormal bleeding  . Sulfa Antibiotics Rash     ROS:  See above HPI for pertinent positives and negatives   Objective:   There were no vitals taken for this visit.  (if some vitals are omitted, this means that patient was UNABLE to obtain them even though they were asked to get them prior to OV today.  They were asked to call us at their earliest convenience with these once obtained. )  General: A & O * 3; sounds in no acute distress; in usual state of health.  Skin: Pt confirms warm and dry extremities and pink fingertips HEENT: Pt confirms lips non-cyanotic Chest: Patient confirms normal chest excursion and movement Respiratory: speaking in full sentences, no conversational dyspnea; patient  confirms no use of accessory muscles Psych: insight appears good, mood- appears full

## 2019-09-03 ENCOUNTER — Other Ambulatory Visit (INDEPENDENT_AMBULATORY_CARE_PROVIDER_SITE_OTHER): Payer: Medicare PPO

## 2019-09-03 ENCOUNTER — Ambulatory Visit: Payer: Medicare PPO

## 2019-09-03 VITALS — BP 114/70 | HR 69 | Ht 65.0 in | Wt 236.0 lb

## 2019-09-03 DIAGNOSIS — M792 Neuralgia and neuritis, unspecified: Secondary | ICD-10-CM | POA: Diagnosis not present

## 2019-09-03 DIAGNOSIS — G629 Polyneuropathy, unspecified: Secondary | ICD-10-CM

## 2019-09-03 DIAGNOSIS — S37001D Unspecified injury of right kidney, subsequent encounter: Secondary | ICD-10-CM

## 2019-09-03 DIAGNOSIS — Z9884 Bariatric surgery status: Secondary | ICD-10-CM

## 2019-09-03 DIAGNOSIS — R5383 Other fatigue: Secondary | ICD-10-CM

## 2019-09-03 DIAGNOSIS — L659 Nonscarring hair loss, unspecified: Secondary | ICD-10-CM | POA: Diagnosis not present

## 2019-09-03 DIAGNOSIS — F1010 Alcohol abuse, uncomplicated: Secondary | ICD-10-CM | POA: Diagnosis not present

## 2019-09-03 DIAGNOSIS — E538 Deficiency of other specified B group vitamins: Secondary | ICD-10-CM | POA: Diagnosis not present

## 2019-09-03 DIAGNOSIS — Z23 Encounter for immunization: Secondary | ICD-10-CM | POA: Diagnosis not present

## 2019-09-03 DIAGNOSIS — I10 Essential (primary) hypertension: Secondary | ICD-10-CM

## 2019-09-03 DIAGNOSIS — E039 Hypothyroidism, unspecified: Secondary | ICD-10-CM

## 2019-09-03 DIAGNOSIS — R748 Abnormal levels of other serum enzymes: Secondary | ICD-10-CM

## 2019-09-03 DIAGNOSIS — E559 Vitamin D deficiency, unspecified: Secondary | ICD-10-CM

## 2019-09-08 ENCOUNTER — Other Ambulatory Visit: Payer: Self-pay | Admitting: Family Medicine

## 2019-09-08 DIAGNOSIS — G629 Polyneuropathy, unspecified: Secondary | ICD-10-CM

## 2019-09-08 LAB — COMPREHENSIVE METABOLIC PANEL
ALT: 48 IU/L — ABNORMAL HIGH (ref 0–32)
AST: 44 IU/L — ABNORMAL HIGH (ref 0–40)
Albumin/Globulin Ratio: 1.4 (ref 1.2–2.2)
Albumin: 3.9 g/dL (ref 3.7–4.7)
Alkaline Phosphatase: 157 IU/L — ABNORMAL HIGH (ref 39–117)
BUN/Creatinine Ratio: 14 (ref 12–28)
BUN: 20 mg/dL (ref 8–27)
Bilirubin Total: 0.7 mg/dL (ref 0.0–1.2)
CO2: 25 mmol/L (ref 20–29)
Calcium: 8.6 mg/dL — ABNORMAL LOW (ref 8.7–10.3)
Chloride: 91 mmol/L — ABNORMAL LOW (ref 96–106)
Creatinine, Ser: 1.43 mg/dL — ABNORMAL HIGH (ref 0.57–1.00)
GFR calc Af Amer: 42 mL/min/{1.73_m2} — ABNORMAL LOW (ref >59)
GFR calc non Af Amer: 37 mL/min/{1.73_m2} — ABNORMAL LOW (ref >59)
Globulin, Total: 2.8 g/dL (ref 1.5–4.5)
Glucose: 87 mg/dL (ref 65–99)
Potassium: 4.3 mmol/L (ref 3.5–5.2)
Sodium: 131 mmol/L — ABNORMAL LOW (ref 134–144)
Total Protein: 6.7 g/dL (ref 6.0–8.5)

## 2019-09-08 LAB — VITAMIN B6: Vitamin B6: 35.3 ug/L — ABNORMAL HIGH (ref 2.0–32.8)

## 2019-09-08 LAB — TSH: TSH: 0.334 u[IU]/mL — ABNORMAL LOW (ref 0.450–4.500)

## 2019-09-08 LAB — MAGNESIUM: Magnesium: 2.3 mg/dL (ref 1.6–2.3)

## 2019-09-08 LAB — FERRITIN: Ferritin: 1079 ng/mL — ABNORMAL HIGH (ref 15–150)

## 2019-09-08 LAB — VITAMIN B1: Thiamine: 112 nmol/L (ref 66.5–200.0)

## 2019-09-08 LAB — VITAMIN B12: Vitamin B-12: 621 pg/mL (ref 232–1245)

## 2019-09-08 LAB — IRON AND TIBC
Iron Saturation: 70 % — ABNORMAL HIGH (ref 15–55)
Iron: 144 ug/dL — ABNORMAL HIGH (ref 27–139)
Total Iron Binding Capacity: 205 ug/dL — ABNORMAL LOW (ref 250–450)
UIBC: 61 ug/dL — ABNORMAL LOW (ref 118–369)

## 2019-09-08 LAB — PHOSPHORUS: Phosphorus: 4.3 mg/dL (ref 3.0–4.3)

## 2019-09-08 LAB — FOLATE: Folate: 3.7 ng/mL (ref >3.0)

## 2019-09-08 LAB — VITAMIN D 25 HYDROXY (VIT D DEFICIENCY, FRACTURES): Vit D, 25-Hydroxy: 33.6 ng/mL (ref 30.0–100.0)

## 2019-09-08 LAB — T4, FREE: Free T4: 1.22 ng/dL (ref 0.82–1.77)

## 2019-09-08 LAB — T3: T3, Total: 68 ng/dL — ABNORMAL LOW (ref 71–180)

## 2019-09-09 ENCOUNTER — Telehealth: Payer: Self-pay | Admitting: Family Medicine

## 2019-09-09 NOTE — Telephone Encounter (Signed)
Pending response from Dr Jenetta Downer about adding lipid before placing orders.

## 2019-09-09 NOTE — Telephone Encounter (Signed)
Call transferred from [place] stating patient needs lab appt for CBC/ Lipids? & also OV  (2& 1/2 months later)-- appt made for 11/03/2019 but there are no Lab orders in pt's charts.   Notes from Lab review show:   Tell patient she needs to come in in 2 months to have blood work rechecked  ---> PLEASE Place future labs now for patient to come in in 2 months to have them repeated of: CBC, transferritin, ferritin, TIBC, UIBC, iron as well as iron saturation level. CMP, TSH, free T4 and T3 total  -----glh

## 2019-09-10 ENCOUNTER — Other Ambulatory Visit: Payer: Medicare PPO

## 2019-09-10 ENCOUNTER — Other Ambulatory Visit: Payer: Self-pay

## 2019-09-10 ENCOUNTER — Encounter: Payer: Self-pay | Admitting: Sports Medicine

## 2019-09-10 ENCOUNTER — Telehealth: Payer: Self-pay

## 2019-09-10 ENCOUNTER — Ambulatory Visit (INDEPENDENT_AMBULATORY_CARE_PROVIDER_SITE_OTHER): Payer: Medicare PPO | Admitting: Sports Medicine

## 2019-09-10 DIAGNOSIS — R5383 Other fatigue: Secondary | ICD-10-CM

## 2019-09-10 DIAGNOSIS — M170 Bilateral primary osteoarthritis of knee: Secondary | ICD-10-CM

## 2019-09-10 DIAGNOSIS — R7989 Other specified abnormal findings of blood chemistry: Secondary | ICD-10-CM

## 2019-09-10 DIAGNOSIS — I10 Essential (primary) hypertension: Secondary | ICD-10-CM

## 2019-09-10 DIAGNOSIS — R531 Weakness: Secondary | ICD-10-CM

## 2019-09-10 DIAGNOSIS — F1010 Alcohol abuse, uncomplicated: Secondary | ICD-10-CM

## 2019-09-10 DIAGNOSIS — E782 Mixed hyperlipidemia: Secondary | ICD-10-CM

## 2019-09-10 NOTE — Telephone Encounter (Addendum)
Per note: CBC, transferritin, ferritin, TIBC, UIBC, iron as well as iron saturation level. CMP, TSH, free T4 and T3 total need to be ordered the patient is requested a Lipid panel as well please advise?  She also wants to cut back /stop lipitor and is calling insurance to discuss crestor coverage

## 2019-09-10 NOTE — Assessment & Plan Note (Signed)
Aspiration injection, crystal analysis. We did a series of Orthovisc back in July 2019 that worked really well. I am going to get her approved again but we will hold off on starting the series until we know the total out-of-pocket.

## 2019-09-10 NOTE — Addendum Note (Signed)
Addended by: Drucilla Schmidt on: 09/10/2019 04:09 PM   Modules accepted: Orders

## 2019-09-10 NOTE — Progress Notes (Signed)
Subjective:    CC: Right knee pain  HPI: Earlier this week this pleasant 73 year old female developed severe and rapid onset swelling of her right knee, severe, persistent, localized without radiation.  I reviewed the past medical history, family history, social history, surgical history, and allergies today and no changes were needed.  Please see the problem list section below in epic for further details.  Past Medical History: Past Medical History:  Diagnosis Date  . Alcohol abuse 04/25/2015  . Allergy   . Anastomotic ulcer S/P gastric bypass 06/18/2016  . Arthritis   . Cataract   . Degenerative disc disease, cervical   . GAD (generalized anxiety disorder) 06/14/2016  . HTN (hypertension) 04/25/2015  . Hypertension   . Hypothyroidism   . 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 Substance abuse (HCC)    Alcohol abuse 2016  . UGIB (upper gastrointestinal bleed) 04/25/2015   Past Surgical History: Past Surgical History:  Procedure Laterality Date  . CHOLECYSTECTOMY    . COSMETIC SURGERY    . DIAGNOSTIC LAPAROSCOPY    . ESOPHAGOGASTRODUODENOSCOPY N/A 04/25/2015   Procedure: ESOPHAGOGASTRODUODENOSCOPY (EGD);  Surgeon: Beverley Fiedler, MD;  Location: [facility] ENDOSCOPY;  Service: Endoscopy;  Laterality: N/A;  . FRACTURE SURGERY    . GASTRIC BYPASS    . NM MYOVIEW LTD  10/10/2017   Normal LV size and function EF 72%.  Medium sized mild severity defect in the apical septal apical lateral and apical wall suggestive of breast attenuation.  LOW RISK.  No ischemia or infarction.    . OPEN REDUCTION INTERNAL FIXATION (ORIF) DISTAL RADIAL FRACTURE Right 01/15/2013   Procedure: OPEN REDUCTION INTERNAL FIXATION (ORIF) Right DISTAL RADIUS FRACTURE;  Surgeon: Eldred Manges, MD;  Location: [facility] OR;  Service: Orthopedics;  Laterality: Right;  . TRANSTHORACIC ECHOCARDIOGRAM  10/04/2017   Mild LVH.  EF 60-65%.  Grade 2/moderate diastolic dysfunction.  Mild  pulmonary hypertension.  No valve lesions   Social History: Social History   Socioeconomic History  . Marital status: Married    Spouse name: Not on file  . Number of children: Not on file  . Years of education: Not on file  . Highest education level: Not on file  Occupational History  . Occupation: Charity fundraiser    Comment: ICU Sales executive  Social Needs  . Financial resource strain: Not on file  . Food insecurity    Worry: Not on file    Inability: Not on file  . Transportation needs    Medical: Not on file    Non-medical: Not on file  Tobacco Use  . Smoking status: Never Smoker  . Smokeless tobacco: Never Used  Substance and Sexual Activity  . Alcohol use: Yes    Alcohol/week: 28.0 standard drinks    Types: 28 Glasses of wine per week    Comment: drinks daily and has a bottle of wine per day  . Drug use: No  . Sexual activity: Yes    Birth control/protection: Post-menopausal  Lifestyle  . Physical activity    Days per week: Not on file    Minutes per session: Not on file  . Stress: Not on file  Relationships  . Social Musician on phone: Not on file    Gets together: Not on file    Attends religious service: Not on file    Active member of club or organization: Not on file    Attends  meetings of clubs or organizations: Not on file    Relationship status: Not on file  Other Topics Concern  . 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 Heart disease Mother   . Stroke Mother 2  . Cancer Mother        skin and breast  . Hypertension Mother   . Breast cancer Mother 72  . COPD Father   . Cancer Father   . Diabetes Father   . Hypertension Father   . Stroke Maternal Grandmother   . Mental illness Maternal Grandmother   . Breast  cancer Sister   . Cancer Cousin 40   Allergies: Allergies  Allergen Reactions  . Cocoa Anaphylaxis  . Red Dye Hives    Rash and Nausea diarrhea  . Lyrica [Pregabalin] Other (See Comments)    Abnormal bleeding  . Sulfa Antibiotics Rash   Medications: See med rec.  Review of Systems: No fevers, chills, night sweats, weight loss, chest pain, or shortness of breath.   Objective:    General: Well Developed, well nourished, and in no acute distress.  Neuro: Alert and oriented x3, extra-ocular muscles intact, sensation grossly intact.  HEENT: Normocephalic, atraumatic, pupils equal round reactive to light, neck supple, no masses, no lymphadenopathy, thyroid nonpalpable.  Skin: Warm and dry, no rashes. Cardiac: Regular rate and rhythm, no murmurs rubs or gallops, no lower extremity edema.  Respiratory: Clear to auscultation bilaterally. Not using accessory muscles, speaking in full sentences. Right knee: Severely swollen, palpable fluid wave ROM normal in flexion and extension and lower leg rotation. Ligaments with solid consistent endpoints including ACL, PCL, LCL, MCL. Negative Mcmurray's and provocative meniscal tests. Non painful patellar compression. Patellar and quadriceps tendons unremarkable. Hamstring and quadriceps strength is normal.  Procedure: Real-time Ultrasound Guided  aspiration/injection of right knee Device: GE Logiq E  Verbal informed consent obtained.  Time-out conducted.  Noted no overlying erythema, induration, or other signs of local infection.  Skin prepped in a sterile fashion.  Local anesthesia: Topical Ethyl chloride.  With sterile technique and under real time ultrasound guidance:  Aspirated about 22 cc of slightly cloudy serosanguineous fluid, syringe switched and 1 cc Kenalog, 2 cc lidocaine, 2 cc bupivacaine injected easily Completed without difficulty  Pain immediately resolved suggesting accurate placement of the medication.  Advised to call if  fevers/chills, erythema, induration, drainage, or persistent bleeding.  Images permanently stored and available for review in the ultrasound unit.  Impression: Technically successful ultrasound guided injection.  Impression and Recommendations:    Primary osteoarthritis of both knees Aspiration injection, crystal analysis. We did a series of Orthovisc back in July 2019 that worked really well. I am going to get her approved again but we will hold off on starting the series until we know the total out-of-pocket.    ___________________________________________ Gwen Her. Dianah Field, M.D., ABFM., CAQSM. Primary Care and Sports Medicine [facility]  Adjunct Professor of [place] [employer] [facility] of [employer]

## 2019-09-11 LAB — SYNOVIAL FLUID, CRYSTAL

## 2019-10-09 ENCOUNTER — Ambulatory Visit (INDEPENDENT_AMBULATORY_CARE_PROVIDER_SITE_OTHER): Payer: Medicare PPO | Admitting: Sports Medicine

## 2019-10-09 ENCOUNTER — Encounter: Payer: Self-pay | Admitting: Sports Medicine

## 2019-10-09 ENCOUNTER — Other Ambulatory Visit: Payer: Self-pay

## 2019-10-09 DIAGNOSIS — M170 Bilateral primary osteoarthritis of knee: Secondary | ICD-10-CM | POA: Diagnosis not present

## 2019-10-09 NOTE — Progress Notes (Signed)
Subjective:    CC: Follow-up  HPI: Knee osteoarthritis: Left knee is doing well, right knee is still hurting, we did an aspiration and injection, crystal analysis was negative.  I reviewed the past medical history, family history, social history, surgical history, and allergies today and no changes were needed.  Please see the problem list section below in epic for further details.  Past Medical History: Past Medical History:  Diagnosis Date  . Alcohol abuse 04/25/2015  . Allergy   . Anastomotic ulcer S/P gastric bypass 06/18/2016  . Arthritis   . Cataract   . Degenerative disc disease, cervical   . GAD (generalized anxiety disorder) 06/14/2016  . HTN (hypertension) 04/25/2015  . Hypertension   . Hypothyroidism   . 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 Substance abuse (HCC)    Alcohol abuse 2016  . UGIB (upper gastrointestinal bleed) 04/25/2015   Past Surgical History: Past Surgical History:  Procedure Laterality Date  . CHOLECYSTECTOMY    . COSMETIC SURGERY    . DIAGNOSTIC LAPAROSCOPY    . ESOPHAGOGASTRODUODENOSCOPY N/A 04/25/2015   Procedure: ESOPHAGOGASTRODUODENOSCOPY (EGD);  Surgeon: Beverley Fiedler, MD;  Location: [facility] ENDOSCOPY;  Service: Endoscopy;  Laterality: N/A;  . FRACTURE SURGERY    . GASTRIC BYPASS    . NM MYOVIEW LTD  10/10/2017   Normal LV size and function EF 72%.  Medium sized mild severity defect in the apical septal apical lateral and apical wall suggestive of breast attenuation.  LOW RISK.  No ischemia or infarction.    . OPEN REDUCTION INTERNAL FIXATION (ORIF) DISTAL RADIAL FRACTURE Right 01/15/2013   Procedure: OPEN REDUCTION INTERNAL FIXATION (ORIF) Right DISTAL RADIUS FRACTURE;  Surgeon: Eldred Manges, MD;  Location: [facility] OR;  Service: Orthopedics;  Laterality: Right;  . TRANSTHORACIC ECHOCARDIOGRAM  10/04/2017   Mild LVH.  EF 60-65%.  Grade 2/moderate diastolic dysfunction.  Mild pulmonary hypertension.  No valve  lesions   Social History: Social History   Socioeconomic History  . Marital status: Married    Spouse name: Not on file  . Number of children: Not on file  . Years of education: Not on file  . Highest education level: Not on file  Occupational History  . Occupation: Charity fundraiser    Comment: ICU Sales executive  Social Needs  . Financial resource strain: Not on file  . Food insecurity    Worry: Not on file    Inability: Not on file  . Transportation needs    Medical: Not on file    Non-medical: Not on file  Tobacco Use  . Smoking status: Never Smoker  . Smokeless tobacco: Never Used  Substance and Sexual Activity  . Alcohol use: Yes    Alcohol/week: 28.0 standard drinks    Types: 28 Glasses of wine per week    Comment: drinks daily and has a bottle of wine per day  . Drug use: No  . Sexual activity: Yes    Birth control/protection: Post-menopausal  Lifestyle  . Physical activity    Days per week: Not on file    Minutes per session: Not on file  . Stress: Not on file  Relationships  . Social Musician on phone: Not on file    Gets together: Not on file    Attends religious service: Not on file    Active member of club or organization: Not on file    Attends meetings of  clubs or organizations: Not on file    Relationship status: Not on file  Other Topics Concern  . 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 Heart disease Mother   . Stroke Mother 51  . Cancer Mother        skin and breast  . Hypertension Mother   . Breast cancer Mother 49  . COPD Father   . Cancer Father   . Diabetes Father   . Hypertension Father   . Stroke Maternal Grandmother   . Mental illness Maternal Grandmother   . Breast cancer Sister   . Cancer Cousin 40    Allergies: Allergies  Allergen Reactions  . Cocoa Anaphylaxis  . Red Dye Hives    Rash and Nausea diarrhea  . Lyrica [Pregabalin] Other (See Comments)    Abnormal bleeding  . Sulfa Antibiotics Rash   Medications: See med rec.  Review of Systems: No fevers, chills, night sweats, weight loss, chest pain, or shortness of breath.   Objective:    General: Well Developed, well nourished, and in no acute distress.  Neuro: Alert and oriented x3, extra-ocular muscles intact, sensation grossly intact.  HEENT: Normocephalic, atraumatic, pupils equal round reactive to light, neck supple, no masses, no lymphadenopathy, thyroid nonpalpable.  Skin: Warm and dry, no rashes. Cardiac: Regular rate and rhythm, no murmurs rubs or gallops, no lower extremity edema.  Respiratory: Clear to auscultation bilaterally. Not using accessory muscles, speaking in full sentences. Right knee: Mildly swollen, tender at the joint line. ROM normal in flexion and extension and lower leg rotation. Ligaments with solid consistent endpoints including ACL, PCL, LCL, MCL. Negative Mcmurray's and provocative meniscal tests. Non painful patellar compression. Patellar and quadriceps tendons unremarkable. Hamstring and quadriceps strength is normal.  Procedure: Real-time Ultrasound Guided injection of the right knee Device: GE Logiq E  Verbal informed consent obtained.  Time-out conducted.  Noted no overlying erythema, induration, or other signs of local infection.  Skin prepped in a sterile fashion.  Local anesthesia: Topical Ethyl chloride.  With sterile technique and under real time ultrasound guidance: 1 cc Kenalog 40, 2 cc lidocaine, 2 cc bupivacaine injected easily Completed without difficulty  Pain immediately resolved suggesting accurate placement of the medication.  Advised to call if fevers/chills, erythema, induration, drainage, or persistent bleeding.  Images permanently stored and available for review in  the ultrasound unit.  Impression: Technically successful ultrasound guided injection.  Impression and Recommendations:    Primary osteoarthritis of both knees Crystal analysis was negative, injecting the right knee today, there seems to be confusion, patient thinks we injected the left knee, but not can argue with her, injecting the right knee. She is a candidate for total knee arthroplasty but does have a cruise coming up in April 2021 so now would not be the time. Orthovisc would be too expensive.   ___________________________________________ Gwen Her. Dianah Field, M.D., ABFM., CAQSM. Primary Care and Sports Medicine [facility]  Adjunct Professor of [employer] [place] [employer] [facility] of Medicine

## 2019-10-09 NOTE — Assessment & Plan Note (Signed)
Crystal analysis was negative, injecting the right knee today, there seems to be confusion, patient thinks we injected the left knee, but not can argue with her, injecting the right knee. She is a candidate for total knee arthroplasty but does have a cruise coming up in April 2021 so now would not be the time. Orthovisc would be too expensive.

## 2019-10-27 ENCOUNTER — Other Ambulatory Visit: Payer: Self-pay | Admitting: Family Medicine

## 2019-10-27 DIAGNOSIS — G8929 Other chronic pain: Secondary | ICD-10-CM

## 2019-10-27 DIAGNOSIS — M549 Dorsalgia, unspecified: Secondary | ICD-10-CM

## 2019-11-03 ENCOUNTER — Other Ambulatory Visit: Payer: Medicare PPO

## 2019-11-03 ENCOUNTER — Other Ambulatory Visit: Payer: Self-pay

## 2019-11-03 DIAGNOSIS — R531 Weakness: Secondary | ICD-10-CM

## 2019-11-03 DIAGNOSIS — R7989 Other specified abnormal findings of blood chemistry: Secondary | ICD-10-CM

## 2019-11-03 DIAGNOSIS — E782 Mixed hyperlipidemia: Secondary | ICD-10-CM | POA: Diagnosis not present

## 2019-11-03 DIAGNOSIS — F1010 Alcohol abuse, uncomplicated: Secondary | ICD-10-CM | POA: Diagnosis not present

## 2019-11-03 DIAGNOSIS — I10 Essential (primary) hypertension: Secondary | ICD-10-CM

## 2019-11-03 DIAGNOSIS — R5383 Other fatigue: Secondary | ICD-10-CM | POA: Diagnosis not present

## 2019-11-03 NOTE — Progress Notes (Signed)
Pt came in for FBW and stated that she had eaten a piece of toast.  Therefore, lipid panel changed to direct LDL.

## 2019-11-03 NOTE — Addendum Note (Signed)
Addended by: Mickel Crow on: 11/03/2019 10:43 AM   Modules accepted: Orders

## 2019-11-04 ENCOUNTER — Telehealth: Payer: Self-pay | Admitting: Family Medicine

## 2019-11-04 ENCOUNTER — Other Ambulatory Visit: Payer: Self-pay | Admitting: Family Medicine

## 2019-11-04 LAB — COMPREHENSIVE METABOLIC PANEL
ALT: 25 IU/L (ref 0–32)
AST: 23 IU/L (ref 0–40)
Albumin/Globulin Ratio: 1.8 (ref 1.2–2.2)
Albumin: 3.9 g/dL (ref 3.7–4.7)
Alkaline Phosphatase: 104 IU/L (ref 39–117)
BUN/Creatinine Ratio: 25 (ref 12–28)
BUN: 22 mg/dL (ref 8–27)
Bilirubin Total: 0.5 mg/dL (ref 0.0–1.2)
CO2: 25 mmol/L (ref 20–29)
Calcium: 9.2 mg/dL (ref 8.7–10.3)
Chloride: 99 mmol/L (ref 96–106)
Creatinine, Ser: 0.89 mg/dL (ref 0.57–1.00)
GFR calc Af Amer: 74 mL/min/{1.73_m2} (ref >59)
GFR calc non Af Amer: 65 mL/min/{1.73_m2} (ref >59)
Globulin, Total: 2.2 g/dL (ref 1.5–4.5)
Glucose: 78 mg/dL (ref 65–99)
Potassium: 4.7 mmol/L (ref 3.5–5.2)
Sodium: 138 mmol/L (ref 134–144)
Total Protein: 6.1 g/dL (ref 6.0–8.5)

## 2019-11-04 LAB — CBC WITH DIFFERENTIAL/PLATELET
Basophils Absolute: 0.1 10*3/uL (ref 0.0–0.2)
Basos: 2 %
EOS (ABSOLUTE): 0.2 10*3/uL (ref 0.0–0.4)
Eos: 3 %
Hematocrit: 38.2 % (ref 34.0–46.6)
Hemoglobin: 12.9 g/dL (ref 11.1–15.9)
Immature Grans (Abs): 0 10*3/uL (ref 0.0–0.1)
Immature Granulocytes: 0 %
Lymphocytes Absolute: 2 10*3/uL (ref 0.7–3.1)
Lymphs: 36 %
MCH: 36.2 pg — ABNORMAL HIGH (ref 26.6–33.0)
MCHC: 33.8 g/dL (ref 31.5–35.7)
MCV: 107 fL — ABNORMAL HIGH (ref 79–97)
Monocytes Absolute: 0.5 10*3/uL (ref 0.1–0.9)
Monocytes: 9 %
Neutrophils Absolute: 2.8 10*3/uL (ref 1.4–7.0)
Neutrophils: 50 %
Platelets: 264 10*3/uL (ref 150–450)
RBC: 3.56 x10E6/uL — ABNORMAL LOW (ref 3.77–5.28)
RDW: 12.7 % (ref 11.7–15.4)
WBC: 5.5 10*3/uL (ref 3.4–10.8)

## 2019-11-04 LAB — T3: T3, Total: 76 ng/dL (ref 71–180)

## 2019-11-04 LAB — TRANSFERRIN: Transferrin: 206 mg/dL (ref 192–364)

## 2019-11-04 LAB — T4, FREE: Free T4: 1.81 ng/dL — ABNORMAL HIGH (ref 0.82–1.77)

## 2019-11-04 LAB — IRON AND TIBC
Iron Saturation: 54 % (ref 15–55)
Iron: 131 ug/dL (ref 27–139)
Total Iron Binding Capacity: 241 ug/dL — ABNORMAL LOW (ref 250–450)
UIBC: 110 ug/dL — ABNORMAL LOW (ref 118–369)

## 2019-11-04 LAB — LDL CHOLESTEROL, DIRECT: LDL Direct: 39 mg/dL (ref 0–99)

## 2019-11-04 LAB — TSH: TSH: 0.169 u[IU]/mL — ABNORMAL LOW (ref 0.450–4.500)

## 2019-11-04 LAB — FERRITIN: Ferritin: 661 ng/mL — ABNORMAL HIGH (ref 15–150)

## 2019-11-04 NOTE — Telephone Encounter (Signed)
-----   Message from Mickel Crow, [place] sent at 11/04/2019 10:36 AM EST ----- Can you please move patient apt for lab follow up to sometime this week? Can be doxy or telehealth ----- Message ----- From: Mellody Dance, DO Sent: 11/04/2019  10:22 AM EST To: [place], CMA  Please inform patient that there are abnormal lab values that we need discuss in person and devise a game plan together of how to improve them.  This should be done in the very near future.    -I see she has an appointment in 1 month but, that is too far in advance and I am not sure why labs were scheduled so far ahead of her appointment.  Thus, she will need to be seen in the next week or so via doxy visit  Please let patient know that if they do not choose to follow-up with me in the near future, this may cause significant detriment to their health and wellbeing.  Thanks, Dr Jenetta Downer

## 2019-11-04 NOTE — Telephone Encounter (Signed)
Called patient and left VM asking for her to call our office to move up her 12/04/19 appt to something sooner to discuss labs obtained. I advised pt that this will be a doxy visit and that she would not need to come into the office to discuss these labs. Waiting for patient to call back.

## 2019-11-06 ENCOUNTER — Other Ambulatory Visit: Payer: Self-pay | Admitting: Family Medicine

## 2019-11-06 ENCOUNTER — Ambulatory Visit (INDEPENDENT_AMBULATORY_CARE_PROVIDER_SITE_OTHER): Payer: Medicare PPO | Admitting: Family Medicine

## 2019-11-06 ENCOUNTER — Other Ambulatory Visit: Payer: Self-pay

## 2019-11-06 ENCOUNTER — Encounter: Payer: Self-pay | Admitting: Family Medicine

## 2019-11-06 VITALS — BP 145/80 | HR 72 | Resp 8 | Ht 65.0 in | Wt 232.0 lb

## 2019-11-06 DIAGNOSIS — N179 Acute kidney failure, unspecified: Secondary | ICD-10-CM | POA: Diagnosis not present

## 2019-11-06 DIAGNOSIS — F1010 Alcohol abuse, uncomplicated: Secondary | ICD-10-CM | POA: Diagnosis not present

## 2019-11-06 DIAGNOSIS — T887XXA Unspecified adverse effect of drug or medicament, initial encounter: Secondary | ICD-10-CM | POA: Insufficient documentation

## 2019-11-06 DIAGNOSIS — S37001D Unspecified injury of right kidney, subsequent encounter: Secondary | ICD-10-CM

## 2019-11-06 DIAGNOSIS — D508 Other iron deficiency anemias: Secondary | ICD-10-CM

## 2019-11-06 DIAGNOSIS — R2689 Other abnormalities of gait and mobility: Secondary | ICD-10-CM | POA: Diagnosis not present

## 2019-11-06 DIAGNOSIS — K429 Umbilical hernia without obstruction or gangrene: Secondary | ICD-10-CM | POA: Insufficient documentation

## 2019-11-06 DIAGNOSIS — E039 Hypothyroidism, unspecified: Secondary | ICD-10-CM | POA: Diagnosis not present

## 2019-11-06 DIAGNOSIS — Z9884 Bariatric surgery status: Secondary | ICD-10-CM

## 2019-11-06 DIAGNOSIS — E559 Vitamin D deficiency, unspecified: Secondary | ICD-10-CM

## 2019-11-06 DIAGNOSIS — E782 Mixed hyperlipidemia: Secondary | ICD-10-CM

## 2019-11-06 DIAGNOSIS — E538 Deficiency of other specified B group vitamins: Secondary | ICD-10-CM | POA: Diagnosis not present

## 2019-11-06 DIAGNOSIS — Z7189 Other specified counseling: Secondary | ICD-10-CM

## 2019-11-06 MED ORDER — LEVOTHYROXINE SODIUM 125 MCG PO TABS: ORAL_TABLET | ORAL | 0 refills | Status: DC

## 2019-11-06 MED ORDER — SHINGRIX 50 MCG/0.5ML IM SUSR: 0.5000 mL | Freq: Once | INTRAMUSCULAR | 0 refills | Status: AC

## 2019-11-06 MED ORDER — LEVOTHYROXINE SODIUM 150 MCG PO TABS: ORAL_TABLET | ORAL | 1 refills | Status: DC

## 2019-11-06 MED ORDER — ATORVASTATIN CALCIUM 80 MG PO TABS: ORAL_TABLET | ORAL | 0 refills | Status: DC

## 2019-11-06 NOTE — Telephone Encounter (Signed)
Patient is scheduled for apt today. AS< CMA

## 2019-11-06 NOTE — Progress Notes (Signed)
Telehealth office visit note for Mellody Dance, D.O- at Primary Care at [facility]   I connected with current patient today and verified that I am speaking with the correct person using two identifiers.   . Location of the patient: Home . Location of the provider: Office Only the patient (+/- their family members at pt's discretion) and myself were participating in the encounter - This visit type was conducted due to national recommendations for restrictions regarding the COVID-19 Pandemic (e.g. social distancing) in an effort to limit this patient's exposure and mitigate transmission in our community.  This format is felt to be most appropriate for this patient at this time.   - The patient did not have access to video technology or had technical difficulties with video requiring transitioning to audio format only. - No physical exam could be performed with this format, beyond that communicated to [place] by the patient/ family members as noted.   - Additionally my office staff/ schedulers discussed with the patient that there may be a monetary charge related to this service, depending on their medical insurance.   The patient expressed understanding, and agreed to proceed.       History of Present Illness: I, Toni Amend, am serving as scribe for Dr. Mellody Dance.    Recently had a birthday.  Says they went to [facility] and had a complimentary 100 dollar dinner.  Notes her husband receives one of these once per year; "normally we go to [place] or [place], but not this year," due to covid.  She's been "pretty good" overall.    - Peripheral Neuropathy Notes her feet continue to bother her a lot, but when she increased her dose of Neurontin, her diarrhea increased too much and she couldn't leave the house.  She now "deals with" the foot pain.   Says "a lot of times it's better if I'm on my feet than off them."   Confirms the symptoms are tolerable, improve with activity.    -  Balance Issues Notes she isn't sure if her balance issues started or worsened since starting the Lipitor.  Says she just knows that "since I started taking [Lipitor] every other day, I haven't had [balance] problems."  HLD:  Notes taking Lipitor every other day ( not as written), and her balance seems to be better.  Pt believes this was related to the statin, not b/l peripheral neuropathy or her excessive ETOH consumption.   States the last time she fell was in January, when she "just tipped over and broke all those bones in my face."  Notes she doesn't go out of the house by herself.  She only goes down the driveway to the trash cans, and always takes her phone.  She feels her balance has improved with this change in Lipitor dosage, noting "little things, such as standing up and turning around."  Notes she is very careful and doesn't do "anything callously" while moving.   - Alcohol Intake Regarding her alcohol usage, says "I have good days and bad days, but in general, I have good days."  Says "I'm sure I'm a little excessive, but I'm doing better and drinking less overall."  Thinks she has cut her alcohol use by 25-30%.   - Iron Deficiency Anemia Patient believes she takes 65 mg of iron per day, "one pill."   -Hernia Patient notes she has an umbilical hernia, and says she thinks "it's getting to the point where I think  it needs to be repaired."  Denies abdominal pain or GI symptoms, so it is not an emergency, but states the area has been getting bigger and protruding more, becoming more bothersome to her.  No signs of incarceration.  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   - Knee Concerns Per patient, Dr. Dianah Field told her she needs to have a knee replacement, but patient states she "plans to skip that for now."  Notes she receives injections in her knee once a year, sometimes twice, and is "not going to  rush into anything" in terms of surgery.   - Dental Concerns Patient plans on obtaining dental implants sometime in the near future, unsure when. - Pt knows she will need her Cards doc to give her medical clearance for these sx   - Generalized Arthritis, Muscle Spasm of Back Takes Flexeril infrequently, "just when my back is about to kill me."  States "it's been a long time [since she refilled her Flexeril], a good 6 months or more before I last had it."  Says "I take Flexeril once a week and I take Meloxicam daily and Tramadol once daily."   Says she tries to limit her use of Tylenol to one per day, but sometimes takes another at night.   HPI:  Hyperlipidemia: 73 y.o. female here for cholesterol follow-up.   - Patient reports good compliance with treatment plan of: medication and/ or lifestyle management.    - Patient denies any acute concerns or problems with management plan.  - She denies new onset of: myalgias, arthralgias, increased fatigue more than normal, chest pains, exercise intolerance, shortness of breath, dizziness, visual changes, headache, lower extremity swelling or claudication.   Most recent cholesterol panel was:  Lab Results  Component Value Date   CHOL 119 05/01/2019   HDL 66 05/01/2019   LDLCALC 29 05/01/2019   LDLDIRECT 39 11/03/2019   TRIG 120 05/01/2019   CHOLHDL 1.8 05/01/2019   Hepatic Function Latest Ref Rng & Units 11/03/2019 09/03/2019 05/01/2019  Total Protein 6.0 - 8.5 g/dL 6.1 6.7 6.0  Albumin 3.7 - 4.7 g/dL 3.9 3.9 4.0  AST 0 - 40 IU/L 23 44(H) 31  ALT 0 - 32 IU/L 25 48(H) 22  Alk Phosphatase 39 - 117 IU/L 104 157(H) 108  Total Bilirubin 0.0 - 1.2 mg/dL 0.5 0.7 0.6  Bilirubin, Direct <=0.2 mg/dL - - -      GAD 7 : Generalized Anxiety Score 05/08/2019 04/17/2017  Nervous, Anxious, on Edge 0 1  Control/stop worrying 0 0  Worry too much - different things 0 1  Trouble relaxing 0 0  Restless 0 0  Easily annoyed or irritable 0 1  Afraid - awful  might happen 0 0  Total GAD 7 Score 0 3  Anxiety Difficulty Not difficult at all Not difficult at all    Depression screen [facility] 2/9 11/06/2019 05/08/2019 01/13/2019 11/04/2018 09/09/2018  Decreased Interest 0 0 0 0 0  Down, Depressed, Hopeless 0 0 1 0 1  PHQ - 2 Score 0 0 1 0 1  Altered sleeping _0 Tired, decreased energy 0 0 _1 Change in appetite 0 0 0 1 1  Feeling bad or failure about yourself  0 0 0 0 0  Trouble concentrating 0 0 0 0 1  Moving slowly or fidgety/restless 0 0 0 0 2  Suicidal thoughts 0 0  0 0 0  PHQ-9 Score _0 Difficult doing work/chores Not difficult at all Not difficult at all Not difficult at all Not difficult at all Somewhat difficult  Some recent data might be hidden      Impression and Recommendations:    1. h/o recent Acute renal insufficiency   2. Injury of right kidney, subsequent encounter   3. Morbid obesity ([place])   4. Alcohol abuse-with elevated ALT and AST   5. Balance problems- due to excess ETOH and peripheral neuropathy   6. Mixed hyperlipidemia   7. Side effect of medication- "balance issues" with statin per pt   8. Hypothyroidism, unspecified type   9. H/O gastric bypass   10. Other iron deficiency anemia   11. B12 deficiency   12. Vitamin D deficiency   13. Umbilical hernia without obstruction and without gangrene   14. Counseling on health promotion and disease prevention       - Extensively Reviewed recent lab work (11/03/2019) in depth with patient today- multiple ABN addressed, discussion had and pt educated on all.   All lab work within normal limits unless otherwise noted.  Extensive education provided and all questions answered.    New complaints of Umbilical Hernia w/out Obstruction, w/out Gangrene- but W - Ambulatory referral to general surgery placed today. - Per patient, reports worsening umbilical hernia.  Denies abdominal pain or GI symptoms or other signs of emergency.  Denies signs of incarceration.   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   - Reviewed red-flags today. - Will continue to monitor.   Neuropathy - Stable at this time, sx tolerable on current gabapentin dose. - Continue management as established. - Will continue to monitor.   Alcohol Abuse/ Overuse->  w/ Elevated ALT & AST;  likely contributing to unsteady gait - Liver enzymes WNL last check. - AST = 23, improved from 44 prior. - ALT = 25, improved from 48 prior.  - Discussed critical importance of cutting down on the alcohol usage, by at least 30-50% or more!  - Continue to avoid hepatotoxic substances- tylenol etc discussed.  - Will continue to monitor.   H/o recent Acute Renal Insuff - Serum creatine improved to 0.89 from 1.43 prior. - Reviewed that patient's acute renal failure appears to have resolved now. - Sodium improved to 138 from 131 prior--> likely due to cutting back on etoh.  - Patient knows to continue to hydrate adequately and avoid nephrotoxic substances/habits- ie. Meloxicam, NSAIDS etc  - Will continue to monitor.   Iron Deficiency Anemia - Discussed labs with pt today;  H/o gastric bypass w poor dietary habits now and excess ETOH intake - Reviewed that patient's ferritin is still high, but almost half of what it was two months ago. - Discussed that labs are trending in a more reassuring direction now. - Cont supp Fe  - Reviewed that many of patient's last labs appear to have been skewed by overuse of alcohol. - Encouraged patient to continue to reduce alcohol consumption, ideally avoiding it.  - Will continue to monitor.   Hypothyroidism  -  not at goal  - TSH = 0.169, low, down from 0.334 last check. - T4 = 1.81, elevated, up from 1.22 prior.  - New prescription sent in for synthroid today.  See med list. - Patient will take 150 mcg every day aside from Tuesday and Friday. - Take 125 mcg on Tuesday  and Friday.  -  Re-check TSH, Free T3, and Free T4 in 6 weeks. - Will continue to monitor.   Vitamin B12 Deficiency- likely due to gastric bypass and ETOH overuse; d/c pt can contribute to neuropathy- so important to keep levels up  - not at goal - Per patient, has not been taking B12 consistently. - Told patient to take B12 every 21 days, as written. - Patient agrees to treatment plan.  - Will continue to monitor and re-check after two to three cycles of consistent treatment.   Balance Problems - Balance Issues on Statin - Discussed that balance issues per se are not a typical S-E while managed on statins, but entirely possible. - d/c pt how it appears her balance is W with excess ETOH useage  - Advised patient that there are many potential causes for her balance issues. - Will continue to monitor.   Hyperlipidemia - LDL = 39 last check, at goal. - Need for full lipid panel re-check in future.  - Patient agrees to continue to evaluate incidence of balance concerns while managed on statin. - Patient will continue taking Lipitor every other day.   - New script given to pt to reflect how she is taking it.  - Dietary changes such as low saturated & trans fat diets for hyperlipidemia and low carb diets for hypertriglyceridemia discussed with patient.    - Encouraged patient to follow AHA guidelines for regular exercise and also engage in weight loss if BMI above 25.   - We will continue to monitor and re-check as recommended.   Recommendations - Re-check FLP, TSH, Free T3, and Free T4 in 6 weeks.  - Told patient to follow up with cardiology as advised and ask them for clearance regarding desire for dental surgery, knee surgery, and hernial repair etc.  Patient understands that she will need clearance from cardiology prn future surgeries.  - Novel Covid -19 counseling done; all questions were answered.   - Current CDC / federal and [place] guidelines reviewed with patient  - Reminded pt of  extreme importance of social distancing; wearing a mask when out in public; insensate handwashing and cleaning of surfaces, avoiding unnecessary trips for shopping and avoiding ALL but emergency appts etc. - Told patient to be prepared, not scared; and be smart for the sake of others - Patient will call with any additional concerns   - As part of my medical decision making, I reviewed the following data within the [place] History obtained from pt /family, CMA notes reviewed and incorporated if applicable, Labs reviewed, Radiograph/ tests reviewed if applicable and OV notes from prior OV's with me, as well as other specialists she/he has seen since seeing me last, were all reviewed and used in my medical decision making process today.    - Additionally, discussion had with patient regarding our treatment plan, and their biases/concerns about that plan were used in my medical decision making today.    - The patient agreed with the plan and demonstrated an understanding of the instructions.   No barriers to understanding were identified.      Return (pt will need repeat B12 in 73month or so), for FBW-lab only (FLP, TSH, T3, T4) 6 weeks, OV doxy f/up in [facility] near future.    Orders Placed This Encounter  Procedures  . Ambulatory referral to General Surgery    Meds ordered this encounter  Medications  . Zoster Vaccine Adjuvanted ([facility]  Sig: Inject 0.5 mLs into the muscle once for 1 dose.    Dispense:  0.5 mL    Refill:  0  . levothyroxine (SYNTHROID) 150 MCG tablet    Sig: everyday except Tues and Friday- take 133mg those days    Dispense:  90 tablet    Refill:  1  . levothyroxine (SYNTHROID) 125 MCG tablet    Sig: Only take 1 tab Tues and Fridays    Dispense:  90 tablet    Refill:  0  . atorvastatin (LIPITOR) 80 MG tablet    Sig: Every other night    Dispense:  90 tablet    Refill:  0    Medications Discontinued During  This Encounter  Medication Reason  . levothyroxine (SYNTHROID) 150 MCG tablet   . atorvastatin (LIPITOR) 80 MG tablet Changed script to reflect how pt is actually taking it     I provided 33+ minutes of non face-to-face time during this encounter.  Additional time was spent with charting and coordination of care after the actual visit commenced.   Note:  This note was prepared with assistance of Dragon voice recognition software. Occasional wrong-word or sound-a-like substitutions may have occurred due to the inherent limitations of voice recognition software.  This document serves as a record of services personally performed by DMellody Dance DO. It was created on her behalf by KToni Amend a trained medical scribe. The creation of this record is based on the scribe's personal observations and the provider's statements to them.   This case required medical decision making of at least moderate complexity. The above documentation has been reviewed to be accurate and was completed by DMarjory Sneddon D.O.     Patient Care Team    Relationship Specialty Notifications Start End  OMellody Dance DO PCP - General Family Medicine  06/14/16   HLeonie Man MD PCP - Cardiology Cardiology Admissions 12/03/18   TSilverio Decamp MD Consulting Physician Sports Medicine  11/08/16    Comment: OA- knee injections; PT  MLauraine Rinne MD Consulting Physician Neurology  09/25/17    Comment: neuropsych- seen him for pinched nerves in neck in past  HLeonie Man MD Consulting Physician Cardiology  12/03/17   HCarol Ada MD Consulting Physician Gastroenterology  02/26/19   LMarin Comment [place] Phys[place] Optometry  10/12/19   RRicard Dillon MD Consulting Physician Internal Medicine  10/12/19    Comment: geriatric medicine     -Vitals obtained; medications/ allergies reconciled;  personal medical, social, Sx etc.histories were updated by CMA, reviewed by me and are  reflected in chart   Patient Active Problem List   Diagnosis Date Noted  . Diastolic dysfunction with chronic heart failure ([place] 10/25/2017  . Hypertriglyceridemia 09/29/2016  . HLD (hyperlipidemia) 09/29/2016  . Adjustment disorder with mixed anxiety and depressed mood 09/29/2016  . Morbid obesity ([place] 06/18/2016  . R Congenital Kidney ABN: Ess only has one fxn kidney 06/18/2016  . Hypertension 04/25/2015  . Alcohol abuse-with elevated ALT and AST 04/25/2015  . Balance problems 11/06/2019  . Side effect of medication- balance issues with statin 11/06/2019  . Chronic venous stasis dermatitis of both lower extremities 03/11/2018  . B12 deficiency 03/04/2018  . Peripheral neuropathy due to edema b/l feet 12/03/2017  . H/O gastric bypass 06/18/2016  . Peripheral edema- b/l L Ext w chronic skin changes 06/18/2016  . GAD (generalized anxiety disorder) 06/14/2016  . UGIB (upper gastrointestinal bleed) 04/25/2015  . Acute ulcer  at gastrojejunal region- post gastric bypass   . Vitamin D deficiency 09/29/2016  . h/o Iron deficiency anemia 06/14/2016  . Hypothyroidism 02/13/2016  . Counseling on health promotion and disease prevention 11/09/2019  . Acute renal failure ([place]) 11/06/2019  . Umbilical hernia without obstruction and without gangrene 11/06/2019  . Venous stasis of both lower extremities 01/10/2019  . Weakness generalized 09/09/2018  . Cellulitis of right leg 12/18/2017  . Decreased pedal pulses 10/27/2017  . Aortic systolic murmur on examination 10/02/2017  . Preoperative cardiovascular examination 10/02/2017  . Exertional dyspnea 09/25/2017  . Bilateral lower extremity edema 11/07/2016  . Primary osteoarthritis of both knees 11/02/2016  . History of hysterectomy 06/18/2016  . Insomnia due to alcohol excess and stress 06/18/2016  . Muscle spasm of back 06/14/2016  . Stress at home 02/13/2016  . Distal radius fracture, right 01/15/2013     Current Meds  Medication Sig   . acetaminophen (TYLENOL) 500 MG tablet Take 1,000 mg by mouth daily.   Marland Kitchen atorvastatin (LIPITOR) 80 MG tablet Every other night  . benazepril-hydrochlorthiazide (LOTENSIN HCT) 20-25 MG tablet Take 1 tablet by mouth daily.  . carvedilol (COREG) 6.25 MG tablet TAKE 1 TABLET BY MOUTH 2 TIMES DAILY WITH A MEAL.  Marland Kitchen Cholecalciferol (VITAMIN D3) 5000 units TABS 5,000 IU OTC vitamin D3 daily. (Patient taking differently: Take 15,000 Units by mouth daily. 5,000 IU OTC vitamin D3 daily.)  . cyanocobalamin (,VITAMIN B-12,) 1000 MCG/ML injection Inject 1 mL (1,000 mcg total) into the muscle every 21 ( twenty-one) days.  . diphenhydrAMINE (BENADRYL) 25 MG tablet Take 25 mg by mouth at bedtime as needed.   . ferrous sulfate 325 (65 FE) MG tablet Take 325 mg by mouth daily with breakfast.  . furosemide (LASIX) 40 MG tablet Take 0.5 tablets (20 mg total) by mouth daily. (Patient taking differently: Take 20 mg by mouth daily. PRN)  . gabapentin (NEURONTIN) 300 MG capsule TAKE 4 CAPSULES BY MOUTH IN THE MORNING, 3 CAPSULES IN THE AFTERNOON, AND 4 CAPUSLES AT NIGHT. (Patient taking differently: TAKE 3 CAPSULES BY MOUTH IN THE MORNING, 3 CAPSULES IN THE AFTERNOON, AND 3 CAPUSLES AT NIGHT.)  . levothyroxine (SYNTHROID) 150 MCG tablet everyday except Tues and Friday- take 189mg those days  . Melatonin 5 MG CAPS Take 10 mg by mouth at bedtime.   . meloxicam (MOBIC) 15 MG tablet Take 1 tablet (15 mg total) by mouth daily.  . mupirocin ointment (BACTROBAN) 2 % Apply to affected wounds twice daily. (Patient taking differently: Apply to affected wounds twice daily. PRN)  . pantoprazole (PROTONIX) 40 MG tablet TAKE 1 TABLET BY MOUTH DAILY.  . traMADol (ULTRAM) 50 MG tablet TAKE 1 TABLET BY MOUTH 2 TIMES DAILY. MAXIMUM 6 TABS PER DAY.  . [DISCONTINUED] atorvastatin (LIPITOR) 80 MG tablet TAKE 1 TABLET BY MOUTH AT BEDTIME.  . [DISCONTINUED] levothyroxine (SYNTHROID) 150 MCG tablet Take 1 tablet (150 mcg total) by mouth daily.      Allergies:  Allergies  Allergen Reactions  . Cocoa Anaphylaxis  . Red Dye Hives    Rash and Nausea diarrhea  . Lyrica [Pregabalin] Other (See Comments)    Abnormal bleeding  . Sulfa Antibiotics Rash     ROS:  See above HPI for pertinent positives and negatives   Objective:   Blood pressure (!) 145/80, pulse 72, resp. rate (!) 8, height _0  (1.651 m), weight 232 lb (105.2 kg).  (if some vitals are omitted, this means that patient was UNABLE  to obtain them even though they were asked to get them prior to OV today.  They were asked to call us at their earliest convenience with these once obtained. )  General: A & O * 3; sounds in no acute distress; in usual state of health.  Skin: Pt confirms warm and dry extremities and pink fingertips HEENT: Pt confirms lips non-cyanotic Chest: Patient confirms normal chest excursion and movement Respiratory: speaking in full sentences, no conversational dyspnea; patient confirms no use of accessory muscles Psych: insight appears good, mood- appears full

## 2019-11-07 ENCOUNTER — Other Ambulatory Visit: Payer: Self-pay | Admitting: Cardiology

## 2019-11-09 DIAGNOSIS — Z7189 Other specified counseling: Secondary | ICD-10-CM | POA: Insufficient documentation

## 2019-12-04 ENCOUNTER — Ambulatory Visit: Payer: Medicare PPO | Admitting: Family Medicine

## 2019-12-15 ENCOUNTER — Other Ambulatory Visit: Payer: Self-pay | Admitting: Sports Medicine

## 2019-12-22 ENCOUNTER — Other Ambulatory Visit: Payer: Self-pay | Admitting: Family Medicine

## 2019-12-22 DIAGNOSIS — I10 Essential (primary) hypertension: Secondary | ICD-10-CM

## 2020-01-09 ENCOUNTER — Other Ambulatory Visit: Payer: Self-pay | Admitting: Family Medicine

## 2020-01-09 DIAGNOSIS — G629 Polyneuropathy, unspecified: Secondary | ICD-10-CM

## 2020-01-13 ENCOUNTER — Other Ambulatory Visit: Payer: Self-pay

## 2020-01-13 ENCOUNTER — Encounter: Payer: Self-pay | Admitting: Cardiology

## 2020-01-13 ENCOUNTER — Ambulatory Visit: Payer: Medicare PPO | Admitting: Cardiology

## 2020-01-13 VITALS — BP 128/77 | HR 89 | Ht 66.0 in | Wt 238.0 lb

## 2020-01-13 DIAGNOSIS — R0600 Dyspnea, unspecified: Secondary | ICD-10-CM

## 2020-01-13 DIAGNOSIS — I5033 Acute on chronic diastolic (congestive) heart failure: Secondary | ICD-10-CM

## 2020-01-13 DIAGNOSIS — R0609 Other forms of dyspnea: Secondary | ICD-10-CM

## 2020-01-13 DIAGNOSIS — I878 Other specified disorders of veins: Secondary | ICD-10-CM | POA: Diagnosis not present

## 2020-01-13 DIAGNOSIS — I5032 Chronic diastolic (congestive) heart failure: Secondary | ICD-10-CM | POA: Diagnosis not present

## 2020-01-13 DIAGNOSIS — I10 Essential (primary) hypertension: Secondary | ICD-10-CM

## 2020-01-13 NOTE — Progress Notes (Signed)
Primary Care Provider: Thomasene Lot, DO Cardiologist: Bryan Lemma, MD Electrophysiologist: None  Clinic Note: Chief Complaint  Patient presents with  . Follow-up    Annual  . Shortness of Breath    Has picked up over the last couple weeks, notably with exertion.  . Edema    Definitely worse from last visit; has not been taking Lasix routinely, is not wearing her support wraps    HPI:    [name] is a 74 y.o. female with a PMH below who presents today for annual follow-up.  [name] was last seen in February 2020 for delayed annual follow-up and preop evaluation for facial surgery.  Overall she is doing fairly well.  Pretty sedentary.  Poor balance and unsteady gait.  She had fallen and broken her nose.  Had issues with vertigo.  Very deconditioned.  Some exertional dyspnea but no change from baseline.  Edema controlled.  Was reducing her Lasix about a half a tablet and not taking frequently. --> Essentially cleared for surgery and no further evaluation recommended.  Recent Hospitalizations: None  Reviewed  CV studies:    The following studies were reviewed today: (if available, images/films reviewed: From Epic Chart or Care Everywhere) . None:   Interval History:   [name] returns here today noting that she is not feeling as well as she had been.  She has noted over the last couple weeks that she is had significantly creased shortness of breath with exertion and worsening swelling.  She feels worn out with doing less and less activity.  Her legs are swollen and painful and feet are almost bursting out of her shoes.  She is sleeping now on couple extra pillows and wakes up short of breath she does not.  She continues to be very sedentary and not very active as far as any particular exercise goes.  She denies any rapid regular heartbeats palpitations.  No chest pain or pressure.  Most of her symptoms are related to dyspnea and  swelling. She really has been more & more sedentary - does not go out walking alone since fall.    CV Review of Symptoms (Summary) Cardiovascular ROS: positive for - dyspnea on exertion, edema, shortness of breath and Dizziness more related to poor balance. negative for - chest pain, edema, palpitations, rapid heart rate or Syncope/near syncope or TIA/amaurosis fugax.  The patient does not have symptoms concerning for COVID-19 infection (fever, chills, cough, or new shortness of breath).  The patient is practicing social distancing & Masking. Husband does shopping.   She has had her first COVID-19 vaccine shot   REVIEWED OF SYSTEMS   A comprehensive ROS was performed. Review of Systems  Constitutional: Positive for malaise/fatigue and weight loss (Had lost about 30 pounds, has put some back on what seems to be fluid weight.).  HENT: Negative for congestion and nosebleeds.   Respiratory: Positive for cough (Increasing cough over the last week or so) and shortness of breath (Increasing over the last week or so).   Cardiovascular: Positive for leg swelling (Has picked up in the last few weeks.).  Gastrointestinal: Positive for heartburn. Negative for abdominal pain, blood in stool and melena.  Genitourinary: Negative for hematuria.  Musculoskeletal: Positive for joint pain. Negative for falls.  Neurological: Positive for dizziness (Vertigo, unsteady gait), tingling and headaches. Negative for focal weakness.       Very poor balance.  Psychiatric/Behavioral: Negative for memory loss. The patient is nervous/anxious. The  patient does not have insomnia (Does not sleep well but not related to insomnia.).    I have reviewed and (if needed) personally updated the patient's problem list, medications, allergies, past medical and surgical history, social and family history.   PAST MEDICAL HISTORY   Past Medical History:  Diagnosis Date  . Alcohol abuse 04/25/2015  . Allergy   . Anastomotic  ulcer S/P gastric bypass 06/18/2016  . Arthritis   . Cataract   . Degenerative disc disease, cervical   . GAD (generalized anxiety disorder) 06/14/2016  . HTN (hypertension) 04/25/2015  . Hypertension   . Hypothyroidism   . 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 Substance abuse (HCC)    Alcohol abuse 2016  . UGIB (upper gastrointestinal bleed) 04/25/2015    PAST SURGICAL HISTORY   Past Surgical History:  Procedure Laterality Date  . CHOLECYSTECTOMY    . COSMETIC SURGERY    . DIAGNOSTIC LAPAROSCOPY    . ESOPHAGOGASTRODUODENOSCOPY N/A 04/25/2015   Procedure: ESOPHAGOGASTRODUODENOSCOPY (EGD);  Surgeon: Beverley Fiedler, MD;  Location: [facility];  Service: Endoscopy;  Laterality: N/A;  . FRACTURE SURGERY    . GASTRIC BYPASS    . NM MYOVIEW LTD  10/10/2017   Normal LV size and function EF 72%.  Medium sized mild severity defect in the apical septal apical lateral and apical wall suggestive of breast attenuation.  LOW RISK.  No ischemia or infarction.    . OPEN REDUCTION INTERNAL FIXATION (ORIF) DISTAL RADIAL FRACTURE Right 01/15/2013   Procedure: OPEN REDUCTION INTERNAL FIXATION (ORIF) Right DISTAL RADIUS FRACTURE;  Surgeon: Eldred Manges, MD;  Location: [facility] OR;  Service: Orthopedics;  Laterality: Right;  . TRANSTHORACIC ECHOCARDIOGRAM  10/04/2017   Mild LVH.  EF 60-65%.  Grade 2/moderate diastolic dysfunction.  Mild pulmonary hypertension.  No valve lesions    MEDICATIONS/ALLERGIES   Current Meds  Medication Sig  . acetaminophen (TYLENOL) 500 MG tablet Take 1,000 mg by mouth daily.   Marland Kitchen atorvastatin (LIPITOR) 80 MG tablet Every other night  . benazepril-hydrochlorthiazide (LOTENSIN HCT) 20-25 MG tablet TAKE 1 TABLET BY MOUTH DAILY.  . carvedilol (COREG) 6.25 MG tablet TAKE 1 TABLET BY MOUTH 2 TIMES DAILY WITH A MEAL.  Marland Kitchen Cholecalciferol (VITAMIN D3) 5000 units TABS 5,000 IU OTC vitamin D3 daily. (Patient taking differently: Take 15,000 Units  by mouth daily. 5,000 IU OTC vitamin D3 daily.)  . cyanocobalamin (,VITAMIN B-12,) 1000 MCG/ML injection Inject 1 mL (1,000 mcg total) into the muscle every 21 ( twenty-one) days.  . cyclobenzaprine (FLEXERIL) 10 MG tablet TAKE 1 TABLET BY MOUTH 3 TIMES DAILY AS NEEDED FOR MUSCLE SPASMS.  Marland Kitchen diphenhydrAMINE (BENADRYL) 25 MG tablet Take 25 mg by mouth at bedtime as needed.   . ferrous sulfate 325 (65 FE) MG tablet Take 325 mg by mouth daily with breakfast.  . furosemide (LASIX) 40 MG tablet Take 0.5 tablets (20 mg total) by mouth daily. (Patient taking differently: Take 20 mg by mouth daily. PRN)  . gabapentin (NEURONTIN) 300 MG capsule TAKE 4 CAPSULES BY MOUTH IN THE MORNING, 3 CAPSULES IN THE AFTERNOON, AND 4 CAPUSLES AT NIGHT.  Marland Kitchen levothyroxine (SYNTHROID) 125 MCG tablet Only take 1 tab Tues and Fridays  . levothyroxine (SYNTHROID) 150 MCG tablet everyday except Tues and Friday- take those days  . Melatonin 5 MG CAPS Take 10 mg by mouth at bedtime.   . meloxicam (MOBIC) 15 MG tablet TAKE 1 TABLET BY  MOUTH DAILY.  . mupirocin ointment (BACTROBAN) 2 % Apply to affected wounds twice daily. (Patient taking differently: Apply to affected wounds twice daily. PRN)  . pantoprazole (PROTONIX) 40 MG tablet TAKE 1 TABLET BY MOUTH DAILY.  . traMADol (ULTRAM) 50 MG tablet TAKE 1 TABLET BY MOUTH 2 TIMES DAILY. MAXIMUM 6 TABS PER DAY.    Allergies  Allergen Reactions  . Cocoa Anaphylaxis  . Red Dye Hives    Rash and Nausea diarrhea  . Lyrica [Pregabalin] Other (See Comments)    Abnormal bleeding  . Sulfa Antibiotics Rash    SOCIAL HISTORY/FAMILY HISTORY   Social History   Tobacco Use  . Smoking status: Never Smoker  . Smokeless tobacco: Never Used  Substance Use Topics  . Alcohol use: Yes    Alcohol/week: 28.0 standard drinks    Types: 28 Glasses of wine per week    Comment: drinks daily and has a bottle of wine per day  . Drug use: No   Social History   Social History Narrative    Marital status: married      Employment: ICU Tree surgeon - retired      Alcohol: drinks bottle of wine daily in 2016   Takes care of her 63 y/o mom at home    Family History family history includes Breast cancer in her sister; Breast cancer (age of onset: 28) in her mother; COPD in her father; Cancer in her father and mother; Cancer (age of onset: 22) in her cousin; Diabetes in her father; Heart disease in her mother; Hypertension in her father and mother; Mental illness in her maternal grandmother; Stroke in her maternal grandmother; Stroke (age of onset: 71) in her mother.  OBJCTIVE -PE, EKG, labs   Wt Readings from Last 3 Encounters:  01/13/20 238 lb (108 kg)  11/06/19 232 lb (105.2 kg)  10/09/19 240 lb (108.9 kg)  Feb 2020 - 250 lb -> notable weight loss, but has now increased weight from what her dry weight has been.    Physical Exam: BP 128/77   Pulse 89   Ht 5\' 6"  (1.676 m)   Wt 238 lb (108 kg)   SpO2 95%   BMI 38.41 kg/m  Physical Exam  Constitutional: She is oriented to person, place, and time. She appears well-nourished. No distress (seems a little short of breath, nontoxic).  Borderline morbidly obese woman.  Well-groomed  HENT:  Head: Normocephalic and atraumatic.  Neck: Hepatojugular reflux (Increased from baseline JVD) and JVD (8-9 cmH2O.) present. Carotid bruit is not present.  Cardiovascular: Normal rate, regular rhythm, S1 normal and S2 normal.  No extrasystoles are present. PMI is not displaced (Unable to assess). Exam reveals distant heart sounds and decreased pulses (Difficult to palpate pedal pulses because of edema). Exam reveals no gallop and no friction rub.  Murmur heard. High-pitched harsh early systolic murmur is present with a grade of 1/6 at the upper right sternal border radiating to the neck. Pulmonary/Chest: Effort normal. She has rales (Mild bibasilar rales).  Abdominal: Soft. Bowel sounds are normal. She exhibits no distension. There is no  abdominal tenderness. There is no rebound.  Obese abdomen.  Difficult to assess HSM.  Does appear to have some increased firmness from fluid.  Unable to assess ascites.  Musculoskeletal:        General: Edema (2-3+ pitting edema bilateral) present. Normal range of motion.     Cervical back: Normal range of motion and neck supple.  Comments: Not currently wearing her support stockings.  Neurological: She is alert and oriented to person, place, and time.  Skin:  She is wearing sandals, feet appear to have a purplish, reddish hue consistent with venous stasis changes.  Psychiatric: She has a normal mood and affect. Her behavior is normal. Judgment and thought content normal.  Vitals reviewed.     Adult ECG Report  Rate: 89 ;  Rhythm: normal sinus rhythm and Left atrial enlargement.  Otherwise normal axis, intervals and durations.;   Narrative Interpretation: Stable EKG.  Recent Labs:    Lab Results  Component Value Date   CHOL 119 05/01/2019   HDL 66 05/01/2019   LDLCALC 29 05/01/2019   LDLDIRECT 39 11/03/2019   TRIG 120 05/01/2019   CHOLHDL 1.8 05/01/2019   Lab Results  Component Value Date   CREATININE 1.04 (H) 01/13/2020   BUN 13 01/13/2020   NA 142 01/13/2020   K 4.7 01/13/2020   CL 103 01/13/2020   CO2 24 01/13/2020   Lab Results  Component Value Date   TSH 0.169 (L) 11/03/2019    ASSESSMENT/PLAN    Problem List Items Addressed This Visit    Hypertension (Chronic)    Blood pressure is little higher today than usual.  Has been quite labile.  She is on benazepril and carvedilol.  For now we will continue current dose until were able to diurese her off.  At that point, we may need to potentially back off the HCTZ and use standing dose of Lasix to avoid recurrence of edema.      Morbid obesity (HCC) (Chronic)    Unfortunately she is so sedentary and limited by balance issues from peripheral neuropathy and weakness.  She is not able to really exercise, this leaves  dietary adjustment as the only option for return lose weight.  It would be great if she could do things like water aerobics, but of course COVID-19 restrictions have made this very difficult.      Venous stasis of both lower extremities (Chronic)    She clearly has evidence of venous stasis dermatitis with edema. We are addressing her diuretic for volume overload, but also recommend that she goes back to using support stockings or wraps with.      Relevant Orders   Brain natriuretic peptide (Completed)   Basic metabolic panel   Comprehensive metabolic panel (Completed)   Acute on chronic diastolic heart failure (HCC) - Primary    Hypertensive heart disease with diastolic dysfunction.  She clearly is volume overloaded now.  Not sure what her previous dry weight had been because she has been losing weight so it is hard to tell how much weight she is up-have it appears to be at least 6 to 8 pounds..  Plan will be to have her ramp back up her Lasix that she has not been taking and to restart using her Ace wraps for compression wraps.  She is on a stable dose of carvedilol and HCTZ-ACE inhibitor. (If we are going to go back to her using standing dose Lasix and we will remove the HCTZ portion of the antihypertensive agent).  Plan: Needs to do daily weights.   Take 40 mg Lasix starting today, then BID for tomorrow the next day, then reduce to 40 mg a day for 3 days or until her weight is back to what her previous dry weight had been.    Once her swelling is back to baseline, this will be her dry  weight.  She will go back to taking 20 mg of Lasix with additional 20 mg as needed for weight gain greater than 3 pounds  We will check BNP and CMP today as well as next Monday or Tuesday.       Exertional dyspnea    Certainly exacerbated by obesity and deconditioning, but now worse.  I think she does have a issue with volume overload with diastolic heart failure.  We can reassess once we have  diuresed her to determine if this is simply just acute on chronic diastolic heart failure.      Relevant Orders   EKG 12-Lead (Completed)   Brain natriuretic peptide (Completed)   Basic metabolic panel   Comprehensive metabolic panel (Completed)      COVID-19 Education: The signs and symptoms of COVID-19 were discussed with the patient and how to seek care for testing (follow up with PCP or arrange E-visit).   The importance of social distancing was discussed today.  I spent a total of with the patient. >  50% of the time was spent in direct patient consultation.  Additional time spent with chart review  / charting (studies, outside notes, etc): 6 Total Time: 28 min   Current medicines are reviewed at length with the patient today.  (+/- concerns) was not sure how often to take Lasix.   Patient Instructions / Medication Changes & Studies & Tests Ordered   Patient Instructions  Medication Instructions:  WEIGHT YOUR SELF DAILY  IN THE MORNING WHEN YOU WAKE UP    TODAY  - TAKE 40 MG  OF LASIX ( FUROSEMIDE)  STARTING TOMORROW 2/17 TAKE 40 MG TWICE A DAY FOR 2 DAYS THEN 40 MG  ONCE A DAY FOR 3 DAYS - YOUR WEIGHT  THIS DAY -WE WILL CONSIDER YOUR DRY WEIGHT.   THIS THE WEIGHT YOU SHOULD BASIS IF YOU HAVE GAIN WEIGHT OVER NIGHT BY 3 LBS   *If you need a refill on your cardiac medications before your next appointment, please call your pharmacy*  Lab Work: TODAY -- BNP ,CMP   NEXT Monday OR Tuesday  01/19/20 OR 01/20/20  PLEASE COME TO THE OFFICE FOR LABS -- BMP   If you have labs (blood work) drawn today and your tests are completely normal, you will receive your results only by: Marland Kitchen MyChart Message (if you have MyChart) OR . A paper copy in the mail If you have any lab test that is abnormal or we need to change your treatment, we will call you to review the results.  Testing/Procedures: NOT NEEDED  Follow-Up: At [facility], you and your health needs are our  priority.  As part of our continuing mission to provide you with exceptional heart care, we have created designated Provider Care Teams.  These Care Teams include your primary Cardiologist (physician) and Advanced Practice Providers (APPs -  Physician Assistants and Nurse Practitioners) who all work together to provide you with the care you need, when you need it.  Your next appointment:   2 week(s)  The format for your next appointment:   Virtual Visit   Provider:   Bryan Lemma, MD  Other Instructions   RESTART USING YOUR COMPRESSION WRAPS ON YOUR LES DURING THE DAY THEN REMOVE AT BEDTIME     Studies Ordered:   Orders Placed This Encounter  Procedures  . Brain natriuretic peptide  . Basic metabolic panel  . Comprehensive metabolic panel  . EKG 12-Lead  Glenetta Hew, M.D., M.S. Interventional Cardiologist   Pager # [phone] Phone # [phone] [address] [place], [place] [address]   Thank you for choosing Heartcare at [facility]!!

## 2020-01-13 NOTE — Patient Instructions (Signed)
Medication Instructions:  WEIGHT YOUR SELF DAILY  IN THE MORNING WHEN YOU WAKE UP    TODAY  - TAKE 40 MG  OF LASIX ( FUROSEMIDE)  STARTING TOMORROW 2/17 TAKE 40 MG TWICE A DAY FOR 2 DAYS THEN 40 MG  ONCE A DAY FOR 3 DAYS - YOUR WEIGHT  THIS DAY -WE WILL CONSIDER YOUR DRY WEIGHT.   THIS THE WEIGHT YOU SHOULD BASIS IF YOU HAVE GAIN WEIGHT OVER NIGHT BY 3 LBS   *If you need a refill on your cardiac medications before your next appointment, please call your pharmacy*  Lab Work: TODAY -- BNP ,CMP   NEXT Monday OR Tuesday  01/19/20 OR 01/20/20  PLEASE COME TO THE OFFICE FOR LABS -- BMP   If you have labs (blood work) drawn today and your tests are completely normal, you will receive your results only by: Marland Kitchen MyChart Message (if you have MyChart) OR . A paper copy in the mail If you have any lab test that is abnormal or we need to change your treatment, we will call you to review the results.  Testing/Procedures: NOT NEEDED  Follow-Up: At [facility], you and your health needs are our priority.  As part of our continuing mission to provide you with exceptional heart care, we have created designated Provider Care Teams.  These Care Teams include your primary Cardiologist (physician) and Advanced Practice Providers (APPs -  Physician Assistants and Nurse Practitioners) who all work together to provide you with the care you need, when you need it.  Your next appointment:   2 week(s)  The format for your next appointment:   Virtual Visit   Provider:   Bryan Lemma, MD  Other Instructions   RESTART USING YOUR COMPRESSION WRAPS ON YOUR LES DURING THE DAY THEN REMOVE AT BEDTIME

## 2020-01-14 LAB — COMPREHENSIVE METABOLIC PANEL
ALT: 36 IU/L — ABNORMAL HIGH (ref 0–32)
AST: 26 IU/L (ref 0–40)
Albumin/Globulin Ratio: 1.6 (ref 1.2–2.2)
Albumin: 3.5 g/dL — ABNORMAL LOW (ref 3.7–4.7)
Alkaline Phosphatase: 120 IU/L — ABNORMAL HIGH (ref 39–117)
BUN/Creatinine Ratio: 13 (ref 12–28)
BUN: 13 mg/dL (ref 8–27)
Bilirubin Total: 0.3 mg/dL (ref 0.0–1.2)
CO2: 24 mmol/L (ref 20–29)
Calcium: 8.6 mg/dL — ABNORMAL LOW (ref 8.7–10.3)
Chloride: 103 mmol/L (ref 96–106)
Creatinine, Ser: 1.04 mg/dL — ABNORMAL HIGH (ref 0.57–1.00)
GFR calc Af Amer: 62 mL/min/{1.73_m2} (ref >59)
GFR calc non Af Amer: 53 mL/min/{1.73_m2} — ABNORMAL LOW (ref >59)
Globulin, Total: 2.2 g/dL (ref 1.5–4.5)
Glucose: 93 mg/dL (ref 65–99)
Potassium: 4.7 mmol/L (ref 3.5–5.2)
Sodium: 142 mmol/L (ref 134–144)
Total Protein: 5.7 g/dL — ABNORMAL LOW (ref 6.0–8.5)

## 2020-01-14 LAB — BRAIN NATRIURETIC PEPTIDE: BNP: 91.1 pg/mL (ref 0.0–100.0)

## 2020-01-15 ENCOUNTER — Encounter: Payer: Self-pay | Admitting: Cardiology

## 2020-01-15 NOTE — Assessment & Plan Note (Signed)
She clearly has evidence of venous stasis dermatitis with edema. We are addressing her diuretic for volume overload, but also recommend that she goes back to using support stockings or wraps with.

## 2020-01-15 NOTE — Assessment & Plan Note (Signed)
Hypertensive heart disease with diastolic dysfunction.  She clearly is volume overloaded now.  Not sure what her previous dry weight had been because she has been losing weight so it is hard to tell how much weight she is up-have it appears to be at least 6 to 8 pounds..  Plan will be to have her ramp back up her Lasix that she has not been taking and to restart using her Ace wraps for compression wraps.  She is on a stable dose of carvedilol and HCTZ-ACE inhibitor. (If we are going to go back to her using standing dose Lasix and we will remove the HCTZ portion of the antihypertensive agent).  Plan: Needs to do daily weights.   Take 40 mg Lasix starting today, then BID for tomorrow the next day, then reduce to 40 mg a day for 3 days or until her weight is back to what her previous dry weight had been.    Once her swelling is back to baseline, this will be her dry weight.  She will go back to taking 20 mg of Lasix with additional 20 mg as needed for weight gain greater than 3 pounds  We will check BNP and CMP today as well as next Monday or Tuesday.

## 2020-01-15 NOTE — Assessment & Plan Note (Signed)
Blood pressure is little higher today than usual.  Has been quite labile.  She is on benazepril and carvedilol.  For now we will continue current dose until were able to diurese her off.  At that point, we may need to potentially back off the HCTZ and use standing dose of Lasix to avoid recurrence of edema.

## 2020-01-15 NOTE — Assessment & Plan Note (Signed)
>>  ASSESSMENT AND PLAN FOR VENOUS STASIS OF BOTH LOWER EXTREMITIES WRITTEN ON 01/15/2020  4:51 PM BY HARDING, DAVID W, MD  She clearly has evidence of venous stasis dermatitis with edema. We are addressing her diuretic for volume overload, but also recommend that she goes back to using support stockings or wraps with.

## 2020-01-15 NOTE — Assessment & Plan Note (Signed)
Certainly exacerbated by obesity and deconditioning, but now worse.  I think she does have a issue with volume overload with diastolic heart failure.  We can reassess once we have diuresed her to determine if this is simply just acute on chronic diastolic heart failure.

## 2020-01-15 NOTE — Assessment & Plan Note (Addendum)
Unfortunately she is so sedentary and limited by balance issues from peripheral neuropathy and weakness.  She is not able to really exercise, this leaves dietary adjustment as the only option for return lose weight.  It would be great if she could do things like water aerobics, but of course COVID-19 restrictions have made this very difficult.

## 2020-01-20 DIAGNOSIS — I878 Other specified disorders of veins: Secondary | ICD-10-CM | POA: Diagnosis not present

## 2020-01-20 DIAGNOSIS — I5032 Chronic diastolic (congestive) heart failure: Secondary | ICD-10-CM | POA: Diagnosis not present

## 2020-01-20 DIAGNOSIS — R0600 Dyspnea, unspecified: Secondary | ICD-10-CM | POA: Diagnosis not present

## 2020-01-21 LAB — BASIC METABOLIC PANEL
BUN/Creatinine Ratio: 17 (ref 12–28)
BUN: 20 mg/dL (ref 8–27)
CO2: 25 mmol/L (ref 20–29)
Calcium: 8.9 mg/dL (ref 8.7–10.3)
Chloride: 97 mmol/L (ref 96–106)
Creatinine, Ser: 1.15 mg/dL — ABNORMAL HIGH (ref 0.57–1.00)
GFR calc Af Amer: 55 mL/min/{1.73_m2} — ABNORMAL LOW (ref >59)
GFR calc non Af Amer: 47 mL/min/{1.73_m2} — ABNORMAL LOW (ref >59)
Glucose: 94 mg/dL (ref 65–99)
Potassium: 4.5 mmol/L (ref 3.5–5.2)
Sodium: 139 mmol/L (ref 134–144)

## 2020-01-29 ENCOUNTER — Encounter: Payer: Self-pay | Admitting: Cardiology

## 2020-01-29 ENCOUNTER — Telehealth (INDEPENDENT_AMBULATORY_CARE_PROVIDER_SITE_OTHER): Payer: Medicare PPO | Admitting: Cardiology

## 2020-01-29 ENCOUNTER — Telehealth: Payer: Self-pay | Admitting: *Deleted

## 2020-01-29 VITALS — BP 122/75 | HR 62 | Ht 65.0 in | Wt 236.0 lb

## 2020-01-29 DIAGNOSIS — R0600 Dyspnea, unspecified: Secondary | ICD-10-CM | POA: Diagnosis not present

## 2020-01-29 DIAGNOSIS — R600 Localized edema: Secondary | ICD-10-CM | POA: Diagnosis not present

## 2020-01-29 DIAGNOSIS — Z7189 Other specified counseling: Secondary | ICD-10-CM

## 2020-01-29 DIAGNOSIS — I509 Heart failure, unspecified: Secondary | ICD-10-CM | POA: Diagnosis not present

## 2020-01-29 DIAGNOSIS — R0609 Other forms of dyspnea: Secondary | ICD-10-CM

## 2020-01-29 DIAGNOSIS — I5033 Acute on chronic diastolic (congestive) heart failure: Secondary | ICD-10-CM

## 2020-01-29 DIAGNOSIS — R0602 Shortness of breath: Secondary | ICD-10-CM

## 2020-01-29 MED ORDER — FUROSEMIDE 40 MG PO TABS: ORAL_TABLET | ORAL | 3 refills | Status: DC

## 2020-01-29 NOTE — Progress Notes (Signed)
Virtual Visit via Telephone Note   This visit type was conducted due to national recommendations for restrictions regarding the COVID-19 Pandemic (e.g. social distancing) in an effort to limit this patient's exposure and mitigate transmission in our community.  Due to her co-morbid illnesses, this patient is at least at moderate risk for complications without adequate follow up.  This format is felt to be most appropriate for this patient at this time.  The patient did not have access to video technology/had technical difficulties with video requiring transitioning to audio format only (telephone).  All issues noted in this document were discussed and addressed.  No physical exam could be performed with this format.  Please refer to the patient's chart for her  consent to telehealth for [facility].   Patient has given verbal permission to conduct this visit via virtual appointment and to bill insurance 02/01/2020 11:16 PM     Evaluation Performed:  Follow-up visit  Date:  02/01/2020   ID:  [name], [name] 29-Sep-1946, MRN [id number]  Patient Location: Home Provider Location: Home  PCP:  Thomasene Lot, DO  Cardiologist:  Bryan Lemma, MD  Electrophysiologist:  None   Chief Complaint:   Chief Complaint  Patient presents with  . 2 Week Follow-up    no complaints , had first covid vaccine  ,2 second dose march 10 ,2021    History of Present Illness:    [name] is a morbidly obese 74 y.o. female with PMH notable for With possible HYPERTENSIVE HEART DISEASE/DIASTOLIC DYSFUNCTION as well as VENOUS STASIS who presents via audio/video conferencing for a telehealth visit today for 3-week follow-up of recent acute on chronic diastolic heart failure episode.  [name] was last seen January 13, 2020 she noted being clinically volume overloaded and more dyspneic than usual. -> Plan was for her to take 40 mg on that day then twice daily the next day with 40  mg daily for 3 days until back to baseline weight.  Once at baseline weight, she will reduce to 20 mg daily.  This would be in lieu of HCTZ.  HCTZ was discontinued.  Recommended Ace wraps for compression.  Hospitalizations:  . none   Recent - Interim CV studies:   The following studies were reviewed today: . N/A    Inerval History   [name] is being seen via telemedicine for follow-up of her recent edema/CHF symptoms.  She says that she is feeling a lot better.  Breathing is notably improved.  Her leg swelling is also improved and she is not having any redness or warmth to suggest cellulitis.  She still sleeps on 2 pillows more because of GERD and dyspnea.  But no PND symptoms.  Overall her energy level has improved but she still limited by arthritis pains.  She is happy that her weight is pretty much back to about 234-236 pounds.   Cardiovascular ROS: positive for - She still has some mild swelling, and exertional dyspnea but notably improved.  Swelling is at baseline if not better. negative for - chest pain, irregular heartbeat, palpitations, paroxysmal nocturnal dyspnea, rapid heart rate, shortness of breath or Syncope/near syncope, TIA/amaurosis fugax   ROS:  Please see the history of present illness.    The patient does not have symptoms concerning for COVID-19 infection (fever, chills, cough, or new shortness of breath).  Review of Systems  Constitutional: Negative for malaise/fatigue (Energy level notably improved.  More active, but this still limited  by OA pain.).  Respiratory: Positive for cough and shortness of breath.        Had some URI symptoms and made her more short of breath.  Seems to be resolved now.  Cardiovascular: Negative for claudication.  Gastrointestinal: Negative for blood in stool and melena.  Genitourinary: Negative for hematuria.  Musculoskeletal: Negative for falls and joint pain.  Neurological: Positive for dizziness (Rare.  More related  vertigo.  Happens more when she is more tired). Negative for weakness and headaches.  Psychiatric/Behavioral: The patient has insomnia (She notes that she does not sleep well.  Usually she goes to bed late and sleeps late.).     The patient is not practicing social distancing. She is due for second Covid shot next week.  Has had the first 2 weeks ago  Past Medical History:  Diagnosis Date  . Alcohol abuse 04/25/2015  . Allergy   . Anastomotic ulcer S/P gastric bypass 06/18/2016  . Arthritis   . Cataract   . Degenerative disc disease, cervical   . GAD (generalized anxiety disorder) 06/14/2016  . HTN (hypertension) 04/25/2015  . Hypertension   . Hypothyroidism   . 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 Substance abuse (HCC)    Alcohol abuse 2016  . UGIB (upper gastrointestinal bleed) 04/25/2015    Past Surgical History:  Procedure Laterality Date  . CHOLECYSTECTOMY    . COSMETIC SURGERY    . DIAGNOSTIC LAPAROSCOPY    . ESOPHAGOGASTRODUODENOSCOPY N/A 04/25/2015   Procedure: ESOPHAGOGASTRODUODENOSCOPY (EGD);  Surgeon: Beverley Fiedler, MD;  Location: [facility] ENDOSCOPY;  Service: Endoscopy;  Laterality: N/A;  . FRACTURE SURGERY    . GASTRIC BYPASS    . NM MYOVIEW LTD  10/10/2017   Normal LV size and function EF 72%.  Medium sized mild severity defect in the apical septal apical lateral and apical wall suggestive of breast attenuation.  LOW RISK.  No ischemia or infarction.    . OPEN REDUCTION INTERNAL FIXATION (ORIF) DISTAL RADIAL FRACTURE Right 01/15/2013   Procedure: OPEN REDUCTION INTERNAL FIXATION (ORIF) Right DISTAL RADIUS FRACTURE;  Surgeon: Eldred Manges, MD;  Location: [facility] OR;  Service: Orthopedics;  Laterality: Right;  . TRANSTHORACIC ECHOCARDIOGRAM  10/04/2017   Mild LVH.  EF 60-65%.  Grade 2/moderate diastolic dysfunction.  Mild pulmonary hypertension.  No valve lesions     Current Meds  Medication Sig  . acetaminophen (TYLENOL) 500 MG  tablet Take 1,000 mg by mouth. Take 1 to 2 tablets daily  . atorvastatin (LIPITOR) 80 MG tablet Every other night  . benazepril-hydrochlorthiazide (LOTENSIN HCT) 20-25 MG tablet TAKE 1 TABLET BY MOUTH DAILY.  . carvedilol (COREG) 6.25 MG tablet TAKE 1 TABLET BY MOUTH 2 TIMES DAILY WITH A MEAL.  Marland Kitchen Cholecalciferol (VITAMIN D3) 125 MCG (5000 UT) TABS Take by mouth. Take 3 to 4 tablets daily  . cyanocobalamin (,VITAMIN B-12,) 1000 MCG/ML injection Inject 1 mL (1,000 mcg total) into the muscle every 21 ( twenty-one) days.  . cyclobenzaprine (FLEXERIL) 10 MG tablet TAKE 1 TABLET BY MOUTH 3 TIMES DAILY AS NEEDED FOR MUSCLE SPASMS.  Marland Kitchen diphenhydrAMINE (BENADRYL) 25 MG tablet Take 25 mg by mouth at bedtime as needed.   . ferrous sulfate 325 (65 FE) MG tablet Take 325 mg by mouth daily with breakfast.  . furosemide (LASIX) 40 MG tablet Take 20 mg (0.5 tablet of 40 mg ) daily except 2 days a week take 40 mg and as  directed  . gabapentin (NEURONTIN) 300 MG capsule Take 300 mg by mouth. Take 5 capsules in the morning and 4 capsules at bedtime.  Marland Kitchen levothyroxine (SYNTHROID) 125 MCG tablet Only take 1 tab Tues and Fridays  . levothyroxine (SYNTHROID) 150 MCG tablet everyday except Tues and Friday- take those days  . Melatonin 5 MG CAPS Take 10 mg by mouth at bedtime.   . meloxicam (MOBIC) 15 MG tablet TAKE 1 TABLET BY MOUTH DAILY.  . pantoprazole (PROTONIX) 40 MG tablet TAKE 1 TABLET BY MOUTH DAILY.  . traMADol (ULTRAM) 50 MG tablet TAKE 1 TABLET BY MOUTH 2 TIMES DAILY. MAXIMUM 6 TABS PER DAY.  . [DISCONTINUED] Cholecalciferol (VITAMIN D3) 5000 units TABS 5,000 IU OTC vitamin D3 daily. (Patient taking differently: Take 5,000 Units by mouth. Takes 3 to 4 tablets a day. Over the counter  Medication)  . [DISCONTINUED] furosemide (LASIX) 40 MG tablet Take 0.5 tablets (20 mg total) by mouth daily. (Patient taking differently: Take 20 mg by mouth daily. PRN)     Allergies:   Cocoa, Red dye, Lyrica [pregabalin],  and Sulfa antibiotics   Social History   Tobacco Use  . Smoking status: Never Smoker  . Smokeless tobacco: Never Used  Substance Use Topics  . Alcohol use: Yes    Alcohol/week: 28.0 standard drinks    Types: 28 Glasses of wine per week    Comment: drinks daily and has a bottle of wine per day  . Drug use: No     Family Hx: The patient's family history includes Breast cancer in her sister; Breast cancer (age of onset: 56) in her mother; COPD in her father; Cancer in her father and mother; Cancer (age of onset: 51) in her cousin; Diabetes in her father; Heart disease in her mother; Hypertension in her father and mother; Mental illness in her maternal grandmother; Stroke in her maternal grandmother; Stroke (age of onset: 42) in her mother.   Labs/Other Tests and Data Reviewed:    EKG:  No ECG reviewed.  Recent Labs: 09/03/2019: Magnesium 2.3 11/03/2019: Hemoglobin 12.9; Platelets 264; TSH 0.169 01/13/2020: ALT 36; BNP 91.1 01/20/2020: BUN 20; Creatinine, Ser 1.15; Potassium 4.5; Sodium 139 ; BNP 91  Recent Lipid Panel Lab Results  Component Value Date/Time   CHOL 119 05/01/2019 08:43 AM   TRIG 120 05/01/2019 08:43 AM   HDL 66 05/01/2019 08:43 AM   CHOLHDL 1.8 05/01/2019 08:43 AM   CHOLHDL 3.5 08/15/2016 09:12 AM   LDLCALC 29 05/01/2019 08:43 AM   LDLDIRECT 39 11/03/2019 10:49 AM    Wt Readings from Last 3 Encounters:  01/29/20 236 lb (107 kg)  01/13/20 238 lb (108 kg)  11/06/19 232 lb (105.2 kg)     Objective:    Vital Signs:  BP 122/75   Pulse 62   Ht 5\' 5"  (1.651 m)   Wt 236 lb (107 kg)   BMI 39.27 kg/m   VITAL SIGNS:  reviewed GEN:  no acute distress A&O x 3.  normal Mood & Affect Non-labored respirations   ASSESSMENT & PLAN:    Problem List Items Addressed This Visit    Acute on chronic diastolic heart failure (HCC)    Was clearly volume overloaded on last visit.  Now she is doing much better after titrating up her Lasix dose.  I think we need to still  treat for slightly lower dry weight.  She remains on enalapril and HCTZ along with carvedilol. Also continuing furosemide as follows:  Keep taking 1/2 tab (20 mg) Lasix (furosemide) daily, but pick 2 days per week to take the full tab (can do 1/2 in Am & 1/2 in afternoon - or full tab in AM).  If swelling or Shortness of Breath increases - take 40 mg x 2 per day for ~2 days, then 40 mg daily until feeling back to normal before dropping back to baseline 20 m.      Relevant Medications   furosemide (LASIX) 40 MG tablet   Bilateral lower extremity edema    Edema seems to be stable now.  Probably has combination of some diastolic function but also venous stasis.  Discussed importance of leg elevation and potentially consider using leg wraps -Ace wraps.  He does not do well with stockings.      Exertional dyspnea    Definitely deconditioned, but symptoms seem to have improved since we increased her diuretic.  Clearly there is a diastolic dysfunction component, but also related to deconditioning and obesity.         COVID-19 Education: The signs and symptoms of COVID-19 were discussed with the patient and how to seek care for testing (follow up with PCP or arrange E-visit).   The importance of social distancing was discussed today.  Time:   Today, I have spent 21 minutes with the patient with telehealth technology discussing the above problems.  Additional 5 minutes in charting.   Medication Adjustments/Labs and Tests Ordered: Current medicines are reviewed at length with the patient today.  Concerns regarding medicines are outlined above.   Patient Instructions  Medication Instructions:   Keep taking 1/2 tab (20 mg) Lasix (furosemide) daily, but pick 2 days per week to take the full tab (can do 1/2 in Am & 1/2 in afternoon - or full tab in AM).  If swelling or Shortness of Breath increases - take 40 mg x 2 per day for ~2 days, then 40 mg daily until feeling back to normal before  dropping back to baseline 20 m.  *If you need a refill on your cardiac medications before your next appointment, please call your pharmacy*   Lab Work: Not needed   Testing/Procedures: Not needed   Follow-Up: At [facility], you and your health needs are our priority.  As part of our continuing mission to provide you with exceptional heart care, we have created designated Provider Care Teams.  These Care Teams include your primary Cardiologist (physician) and Advanced Practice Providers (APPs -  Physician Assistants and Nurse Practitioners) who all work together to provide you with the care you need, when you need it.  We recommend signing up for the patient portal called "MyChart".  Sign up information is provided on this After Visit Summary.  MyChart is used to connect with patients for Virtual Visits (Telemedicine).  Patients are able to view lab/test results, encounter notes, upcoming appointments, etc.  Non-urgent messages can be sent to your provider as well.   To learn more about what you can do with MyChart, go to [email or web address].    Your next appointment:   6 month(s)  The format for your next appointment:   In Person  Provider:   Glenetta Hew, MD   Other Instructions n/a     Signed, Glenetta Hew, MD  02/01/2020 11:16 PM    [place]

## 2020-01-29 NOTE — Patient Instructions (Addendum)
Medication Instructions:   Keep taking 1/2 tab (20 mg) Lasix (furosemide) daily, but pick 2 days per week to take the full tab (can do 1/2 in Am & 1/2 in afternoon - or full tab in AM).  If swelling or Shortness of Breath increases - take 40 mg x 2 per day for ~2 days, then 40 mg daily until feeling back to normal before dropping back to baseline 20 m.  *If you need a refill on your cardiac medications before your next appointment, please call your pharmacy*   Lab Work: Not needed   Testing/Procedures: Not needed   Follow-Up: At [facility], you and your health needs are our priority.  As part of our continuing mission to provide you with exceptional heart care, we have created designated Provider Care Teams.  These Care Teams include your primary Cardiologist (physician) and Advanced Practice Providers (APPs -  Physician Assistants and Nurse Practitioners) who all work together to provide you with the care you need, when you need it.  We recommend signing up for the patient portal called "MyChart".  Sign up information is provided on this After Visit Summary.  MyChart is used to connect with patients for Virtual Visits (Telemedicine).  Patients are able to view lab/test results, encounter notes, upcoming appointments, etc.  Non-urgent messages can be sent to your provider as well.   To learn more about what you can do with MyChart, go to [email or web address].    Your next appointment:   6 month(s)  The format for your next appointment:   In Person  Provider:   Bryan Lemma, MD   Other Instructions n/a

## 2020-01-29 NOTE — Telephone Encounter (Signed)
RN spoke to patient. Instruction were given  from today's virtual visit 01/29/20 .  AVS SUMMARY has been mailed.   Patient verbalized understanding

## 2020-02-01 ENCOUNTER — Encounter: Payer: Self-pay | Admitting: Cardiology

## 2020-02-01 NOTE — Assessment & Plan Note (Signed)
Was clearly volume overloaded on last visit.  Now she is doing much better after titrating up her Lasix dose.  I think we need to still treat for slightly lower dry weight.  She remains on enalapril and HCTZ along with carvedilol. Also continuing furosemide as follows:  Keep taking 1/2 tab (20 mg) Lasix (furosemide) daily, but pick 2 days per week to take the full tab (can do 1/2 in Am & 1/2 in afternoon - or full tab in AM).  If swelling or Shortness of Breath increases - take 40 mg x 2 per day for ~2 days, then 40 mg daily until feeling back to normal before dropping back to baseline 20 m.

## 2020-02-01 NOTE — Assessment & Plan Note (Signed)
Edema seems to be stable now.  Probably has combination of some diastolic function but also venous stasis.  Discussed importance of leg elevation and potentially consider using leg wraps -Ace wraps.  He does not do well with stockings.

## 2020-02-01 NOTE — Assessment & Plan Note (Signed)
Definitely deconditioned, but symptoms seem to have improved since we increased her diuretic.  Clearly there is a diastolic dysfunction component, but also related to deconditioning and obesity.

## 2020-02-16 ENCOUNTER — Other Ambulatory Visit: Payer: Medicare PPO

## 2020-02-16 ENCOUNTER — Other Ambulatory Visit: Payer: Self-pay

## 2020-02-16 DIAGNOSIS — E039 Hypothyroidism, unspecified: Secondary | ICD-10-CM

## 2020-02-16 DIAGNOSIS — E782 Mixed hyperlipidemia: Secondary | ICD-10-CM | POA: Diagnosis not present

## 2020-02-16 DIAGNOSIS — I10 Essential (primary) hypertension: Secondary | ICD-10-CM | POA: Diagnosis not present

## 2020-02-16 DIAGNOSIS — E781 Pure hyperglyceridemia: Secondary | ICD-10-CM | POA: Diagnosis not present

## 2020-02-16 DIAGNOSIS — F1010 Alcohol abuse, uncomplicated: Secondary | ICD-10-CM | POA: Diagnosis not present

## 2020-02-16 DIAGNOSIS — E538 Deficiency of other specified B group vitamins: Secondary | ICD-10-CM

## 2020-02-16 NOTE — Progress Notes (Signed)
Re-check FLP, TSH, Free T3, and Free T4 in 6 weeks.

## 2020-02-16 NOTE — Addendum Note (Signed)
Addended by: Sylvester Harder on: 02/16/2020 09:13 AM   Modules accepted: Orders

## 2020-02-17 LAB — T3: T3, Total: 72 ng/dL (ref 71–180)

## 2020-02-17 LAB — T4, FREE: Free T4: 1.11 ng/dL (ref 0.82–1.77)

## 2020-02-17 LAB — LIPID PANEL
Chol/HDL Ratio: 2 ratio (ref 0.0–4.4)
Cholesterol, Total: 131 mg/dL (ref 100–199)
HDL: 67 mg/dL (ref >39)
LDL Chol Calc (NIH): 36 mg/dL (ref 0–99)
Triglycerides: 174 mg/dL — ABNORMAL HIGH (ref 0–149)
VLDL Cholesterol Cal: 28 mg/dL (ref 5–40)

## 2020-02-17 LAB — TSH: TSH: 1.38 u[IU]/mL (ref 0.450–4.500)

## 2020-02-17 LAB — VITAMIN B12: Vitamin B-12: 535 pg/mL (ref 232–1245)

## 2020-02-24 NOTE — Progress Notes (Signed)
Patient is aware of lab results. AS, CMA 

## 2020-03-03 ENCOUNTER — Other Ambulatory Visit: Payer: Self-pay | Admitting: Family Medicine

## 2020-03-03 DIAGNOSIS — M549 Dorsalgia, unspecified: Secondary | ICD-10-CM

## 2020-03-03 DIAGNOSIS — G8929 Other chronic pain: Secondary | ICD-10-CM

## 2020-03-16 ENCOUNTER — Telehealth: Payer: Self-pay | Admitting: Family Medicine

## 2020-03-16 NOTE — Telephone Encounter (Signed)
Patient asking to switch from Lipitor to Crestor. Patient states you have discussed this change in med with her before.   Patient was last seen 11/06/2019   See below note from this date:   Hyperlipidemia - LDL = 39 last check, at goal. - Need for full lipid panel re-check in future.  - Patient agrees to continue to evaluate incidence of balance concerns while managed on statin. - Patient will continue taking Lipitor every other day.   - New script given to pt to reflect how she is taking it.  - Dietary changes such as low saturated & trans fat diets for hyperlipidemia and low carb diets for hypertriglyceridemia discussed with patient.    - Encouraged patient to follow AHA guidelines for regular exercise and also engage in weight loss if BMI above 25.   - We will continue to monitor and re-check as recommended.    [place] [facility]

## 2020-03-16 NOTE — Telephone Encounter (Signed)
Can you please contact patient to schedule an appointment as advised per Dr. Sharee Holster below. AS, CMA

## 2020-03-16 NOTE — Telephone Encounter (Signed)
Per Joselyn Glassman Dr. Sharee Holster has no openings before she leaves. [facility] next available is May 18/2021.  Can we do lab work and then switch meds for patient or does she need to wait for next available. AS, CMA

## 2020-03-16 NOTE — Telephone Encounter (Signed)
It didn't look like I forwarded message to u..so just making sure. See prior note

## 2020-03-16 NOTE — Telephone Encounter (Signed)
From last OV note in early Dec 2020, pt was told she needed to f/up in 33mo after med changes that were made and also needed reck labs w/in 6 wks from last OV.   ( see details below)    Thus, I rec she f/up as instructed.  At this time, she will need to obtain labs and make a f/up OV and then she can discuss changing meds at future appt once we have current lipid panel done.   TELL HER TO MAKE A F/UP OV FIRST, then MAKE LAB-ONLY VISIT 3 Days prior to the OV so results will be back at OV.   Last ov on DEC 10th, 2020- pt told to make appt for:  FBW-lab only (FLP, TSH, T3, T4) 6 weeks, OV doxy f/up in 71mo, PLUS-Medicare Wellness near future (additionally pt will need repeat B12 level in 4 months)

## 2020-03-17 NOTE — Telephone Encounter (Signed)
Because there were more issues than just her cholesterol, she needed to follow up in three months from last time. She needs to be followed for her chronic disease management beyond just her cholesterol

## 2020-03-17 NOTE — Telephone Encounter (Signed)
Patient is aware of the below and call was transferred to front desk for scheduling. AS, CMA

## 2020-03-29 ENCOUNTER — Other Ambulatory Visit: Payer: Self-pay | Admitting: Sports Medicine

## 2020-03-29 ENCOUNTER — Other Ambulatory Visit: Payer: Self-pay | Admitting: Family Medicine

## 2020-03-29 ENCOUNTER — Telehealth: Payer: Self-pay

## 2020-03-29 DIAGNOSIS — Z1231 Encounter for screening mammogram for malignant neoplasm of breast: Secondary | ICD-10-CM

## 2020-03-29 DIAGNOSIS — M170 Bilateral primary osteoarthritis of knee: Secondary | ICD-10-CM

## 2020-03-29 MED ORDER — TRAMADOL HCL 50 MG PO TABS: ORAL_TABLET | ORAL | 0 refills | Status: DC

## 2020-03-29 NOTE — Telephone Encounter (Signed)
[facility] called and wanted to confirm the Tramadol dosing. They questioned/or confused about the total daily max, the directions were 1 tablet twice daily. Per Dr Benjamin Stain, Don't know why that is confusing for them but yes start twice daily but may use up to 6.  Pharmacy advised.

## 2020-04-12 ENCOUNTER — Ambulatory Visit
Admission: RE | Admit: 2020-04-12 | Discharge: 2020-04-12 | Disposition: A | Discharge status: Home / Self Care | Payer: Medicare PPO | Source: Ambulatory Visit | Attending: Family Medicine | Admitting: Family Medicine

## 2020-04-12 ENCOUNTER — Other Ambulatory Visit: Payer: Self-pay

## 2020-04-12 DIAGNOSIS — Z1231 Encounter for screening mammogram for malignant neoplasm of breast: Secondary | ICD-10-CM | POA: Diagnosis not present

## 2020-04-12 IMAGING — MG DIGITAL SCREENING BILAT W/ TOMO W/ CAD
6 of 10 series · 6 of 30 positions shown · non-contrast
Comparison: Previous exam(s).

CLINICAL DATA: Screening.

EXAM:
DIGITAL SCREENING BILATERAL MAMMOGRAM WITH TOMO AND CAD

[R CC synth-2D]
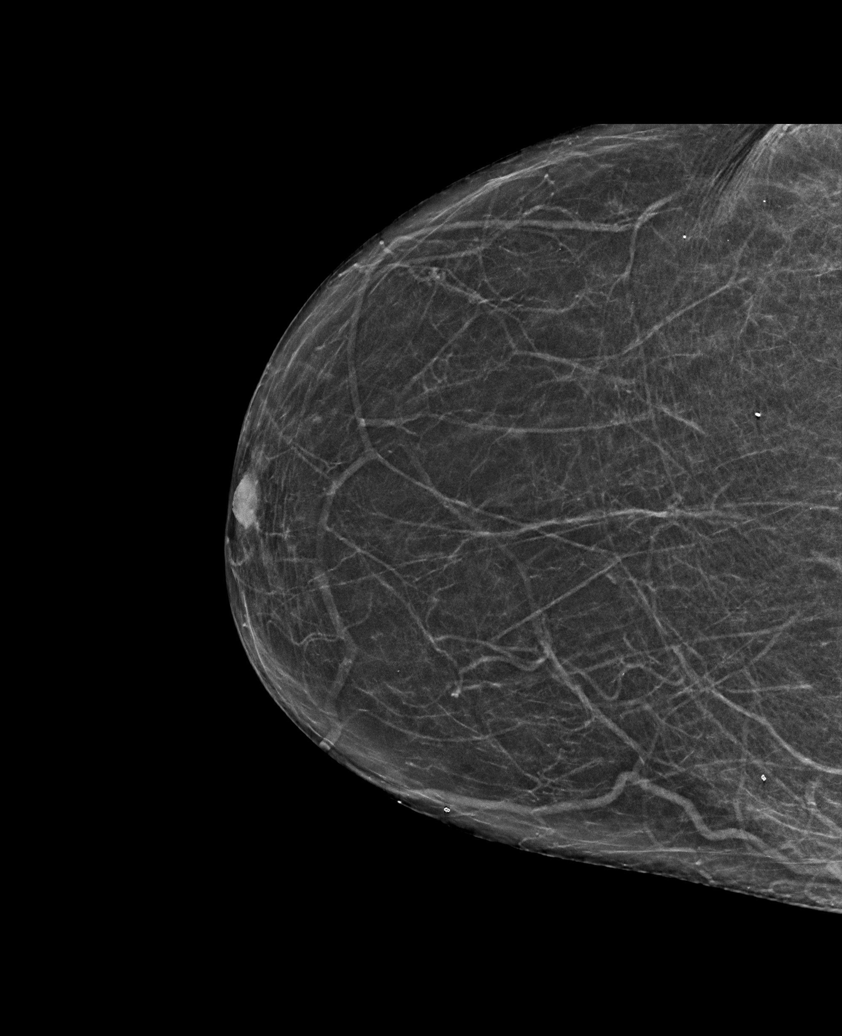

[L MLO synth-2D (1 of 2)]
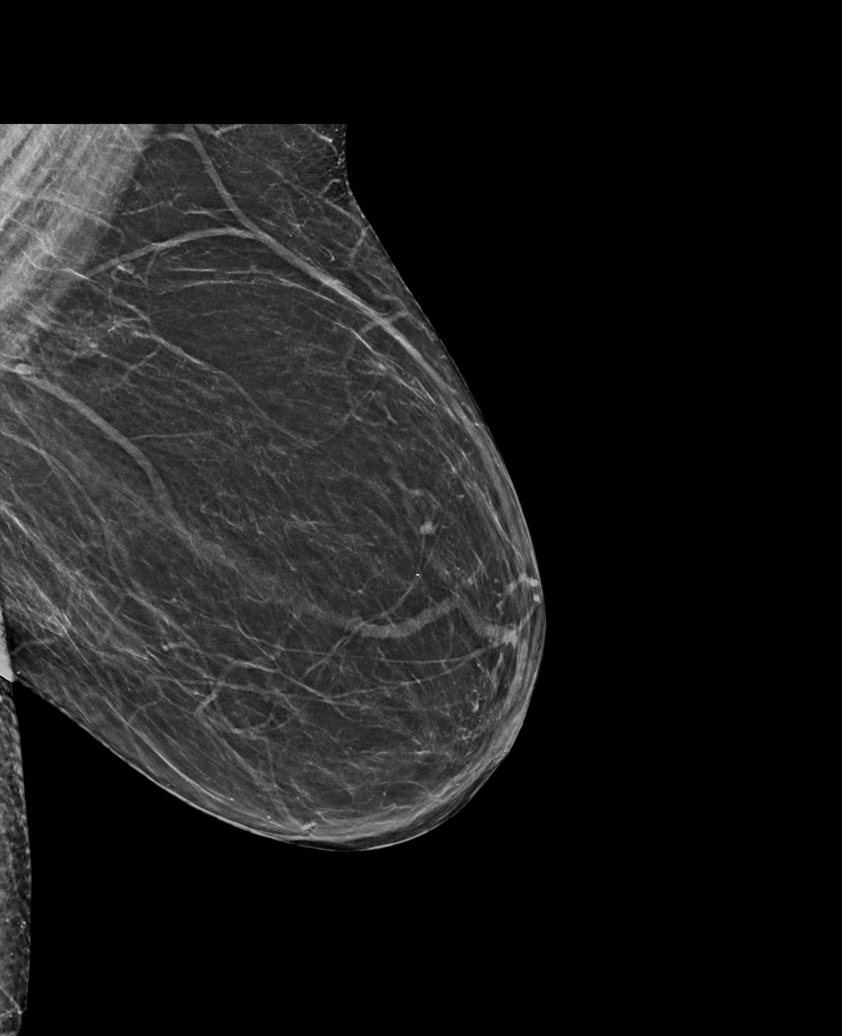

[R MLO synth-2D]
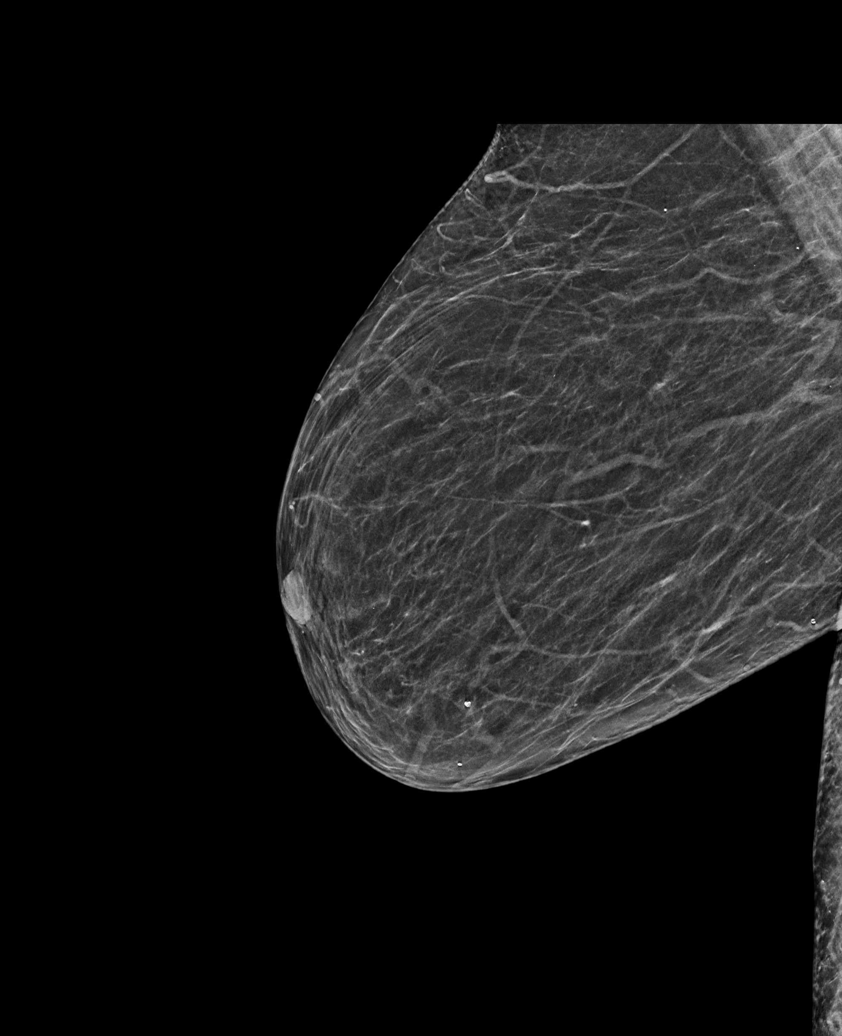

[L CC synth-2D]
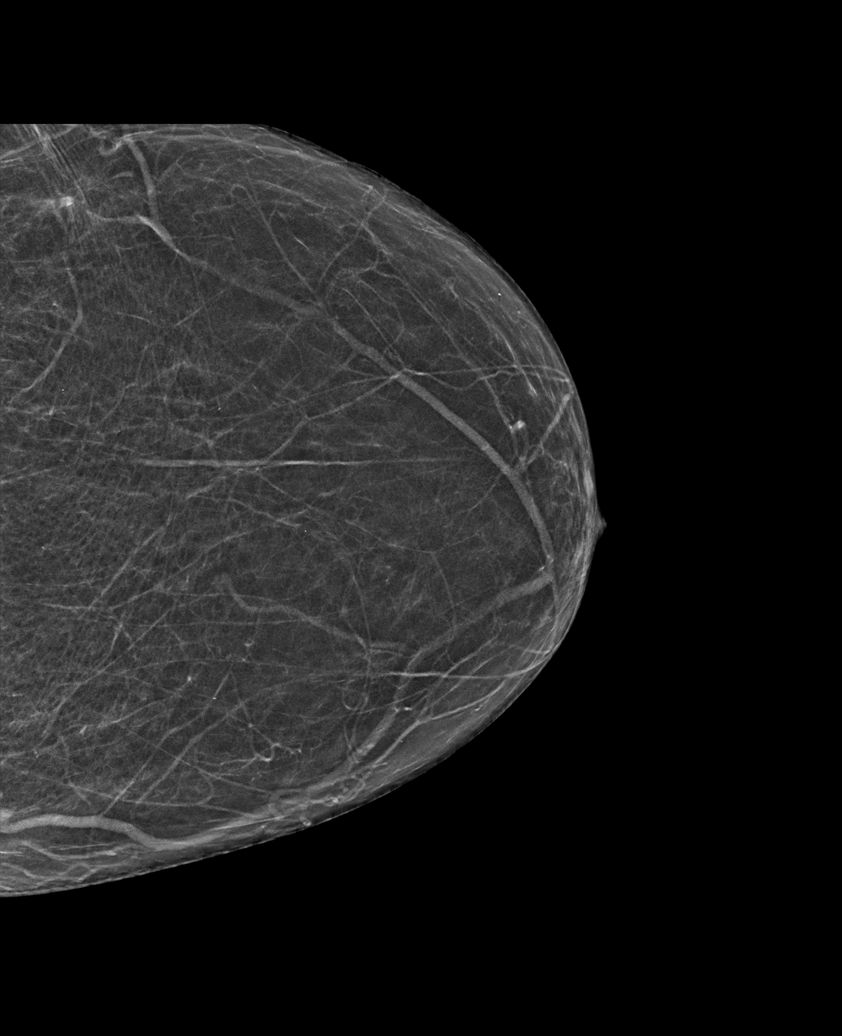

[L MLO synth-2D (2 of 2)]
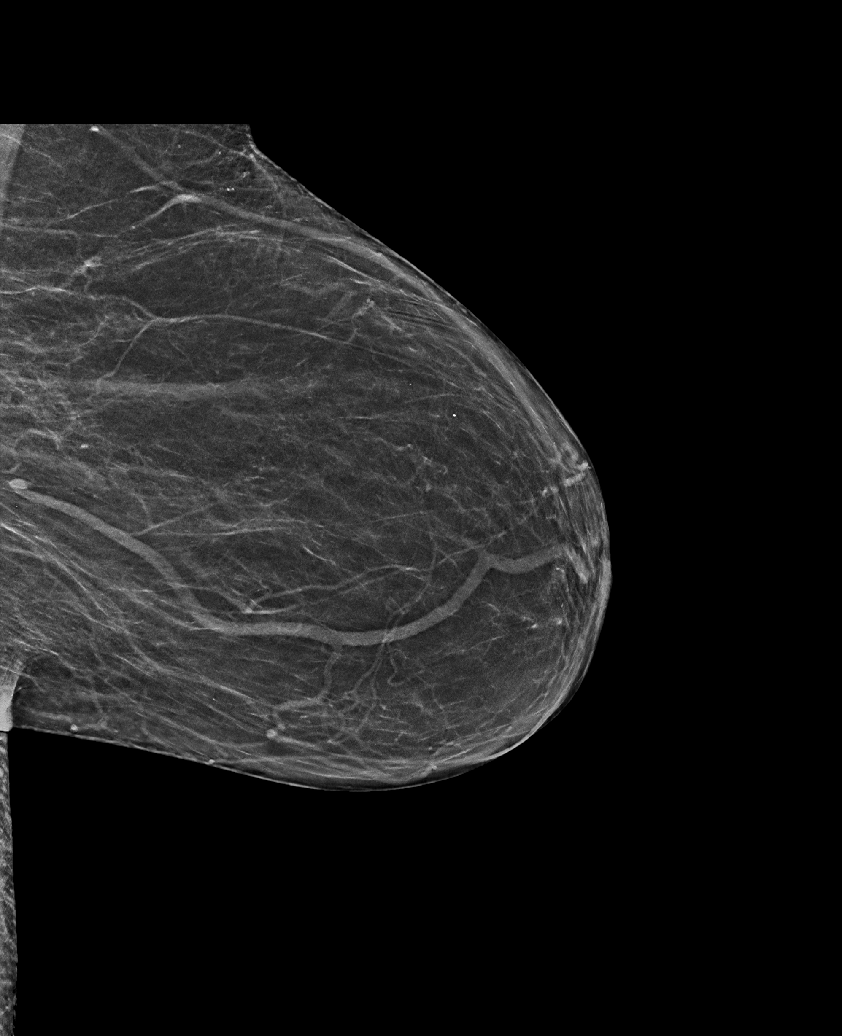

[R MLO tomo · tomo slice 29/58.0]
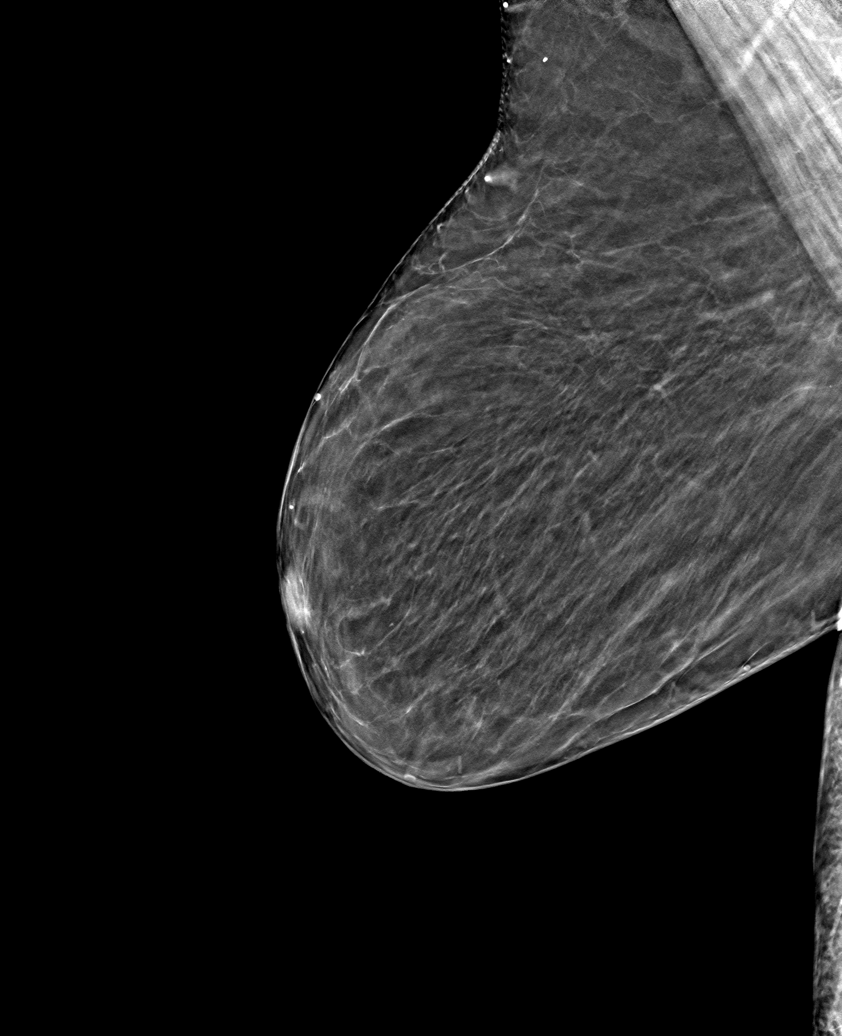

[6 of 30 positions shown; findings below may reference images not displayed]

ACR Breast Density Category b: There are scattered areas of
fibroglandular density.
FINDINGS: There are no findings suspicious for malignancy. Images were
processed with CAD.
IMPRESSION: No mammographic evidence of malignancy. A result letter of this
screening mammogram will be mailed directly to the patient.

RECOMMENDATION:
Screening mammogram in one year. (Code:[TQ])

BI-RADS CATEGORY  1: Negative.

## 2020-04-13 ENCOUNTER — Ambulatory Visit (INDEPENDENT_AMBULATORY_CARE_PROVIDER_SITE_OTHER): Payer: Medicare PPO | Admitting: Physician Assistant

## 2020-04-13 ENCOUNTER — Encounter: Payer: Self-pay | Admitting: Physician Assistant

## 2020-04-13 ENCOUNTER — Other Ambulatory Visit: Payer: Self-pay

## 2020-04-13 VITALS — BP 134/78 | HR 68 | Temp 98.0°F | Ht 66.0 in | Wt 228.4 lb

## 2020-04-13 DIAGNOSIS — E782 Mixed hyperlipidemia: Secondary | ICD-10-CM | POA: Diagnosis not present

## 2020-04-13 DIAGNOSIS — F1010 Alcohol abuse, uncomplicated: Secondary | ICD-10-CM

## 2020-04-13 DIAGNOSIS — G629 Polyneuropathy, unspecified: Secondary | ICD-10-CM | POA: Diagnosis not present

## 2020-04-13 DIAGNOSIS — I10 Essential (primary) hypertension: Secondary | ICD-10-CM

## 2020-04-13 DIAGNOSIS — R600 Localized edema: Secondary | ICD-10-CM

## 2020-04-13 DIAGNOSIS — E039 Hypothyroidism, unspecified: Secondary | ICD-10-CM

## 2020-04-13 DIAGNOSIS — M792 Neuralgia and neuritis, unspecified: Secondary | ICD-10-CM

## 2020-04-13 MED ORDER — ROSUVASTATIN CALCIUM 5 MG PO TABS: 5.0000 mg | ORAL_TABLET | Freq: Every day | ORAL | 1 refills | Status: DC

## 2020-04-13 NOTE — Patient Instructions (Signed)

## 2020-04-13 NOTE — Progress Notes (Signed)
Established Patient Office Visit  Subjective:  Patient ID: [name], female    DOB: May 17, 1946  Age: 74 y.o. MRN: [id number]  CC:  Chief Complaint  Patient presents with  . Hyperlipidemia  . Hypothyroidism  . Elevated Hepatic Enzymes  . Alcohol Intoxication  . Peripheral Neuropathy    HPI [name] presents for discussion on changing cholesterol medication due to side effects. Reports she was taking Lipitor 80 mg but discontinued it due to frequent falls she was experiencing. Reports she and Dr. Sharee Holster briefly discussed changing to Crestor at last OV if she continued to have recurrent falls. She follows a heart healthy diet and goes for walks with her husband.   HTN: Pt denies new onset chest pain, palpitations, dizziness or increased lower extremity swelling from baseline. Taking medication as directed without side effects. Checks BP at home occasionally and readings range in 128/130/70s. Pt tries to follow a low salt diet.  Hypothyroid: Asymptomatic. Denies weight changes, heat/cold intolerance, increased fatigue. Taking Synthroid 150 mg and 125 mg.   Neuropathy: Under control with Neurotin.   Alcohol abuse: Reports she is still working on decreasing her alcohol consumption. States a 5 L wine box lasts 1 week.      Past Medical History:  Diagnosis Date  . Alcohol abuse 04/25/2015  . Allergy   . Anastomotic ulcer S/P gastric bypass 06/18/2016  . Arthritis   . Cataract   . Degenerative disc disease, cervical   . GAD (generalized anxiety disorder) 06/14/2016  . HTN (hypertension) 04/25/2015  . Hypertension   . Hypothyroidism   . 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 Substance abuse (HCC)    Alcohol abuse 2016  . UGIB (upper gastrointestinal bleed) 04/25/2015    Past Surgical History:  Procedure Laterality Date  . CHOLECYSTECTOMY    . COSMETIC SURGERY    . DIAGNOSTIC LAPAROSCOPY    .  ESOPHAGOGASTRODUODENOSCOPY N/A 04/25/2015   Procedure: ESOPHAGOGASTRODUODENOSCOPY (EGD);  Surgeon: Beverley Fiedler, MD;  Location: [facility] ENDOSCOPY;  Service: Endoscopy;  Laterality: N/A;  . FRACTURE SURGERY    . GASTRIC BYPASS    . NM MYOVIEW LTD  10/10/2017   Normal LV size and function EF 72%.  Medium sized mild severity defect in the apical septal apical lateral and apical wall suggestive of breast attenuation.  LOW RISK.  No ischemia or infarction.    . OPEN REDUCTION INTERNAL FIXATION (ORIF) DISTAL RADIAL FRACTURE Right 01/15/2013   Procedure: OPEN REDUCTION INTERNAL FIXATION (ORIF) Right DISTAL RADIUS FRACTURE;  Surgeon: Eldred Manges, MD;  Location: [facility] OR;  Service: Orthopedics;  Laterality: Right;  . TRANSTHORACIC ECHOCARDIOGRAM  10/04/2017   Mild LVH.  EF 60-65%.  Grade 2/moderate diastolic dysfunction.  Mild pulmonary hypertension.  No valve lesions    Family History  Problem Relation Age of Onset  . Heart disease Mother   . Stroke Mother 66  . Cancer Mother        skin and breast  . Hypertension Mother   . Breast cancer Mother 1  . COPD Father   . Cancer Father   . Diabetes Father   . Hypertension Father   . Stroke Maternal Grandmother   . Mental illness Maternal Grandmother   . Breast cancer Sister   . Cancer Cousin 13    Social History   Socioeconomic History  . Marital status: Married    Spouse name: Not on file  .  Number of children: Not on file  . Years of education: Not on file  . Highest education level: Not on file  Occupational History  . Occupation: Therapist, sports    Comment: ICU Tree surgeon  Tobacco Use  . Smoking status: Never Smoker  . Smokeless tobacco: Never Used  Substance and Sexual Activity  . Alcohol use: Yes    Alcohol/week: 28.0 standard drinks    Types: 28 Glasses of wine per week    Comment: drinks daily and has a bottle of wine per day  . Drug use: No  . Sexual activity: Yes    Birth control/protection: Post-menopausal  Other Topics Concern  . 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 Difficulty of Paying Living Expenses:   Food Insecurity:   . Worried About Charity fundraiser in the Last Year:   . Arboriculturist in the Last Year:   Transportation Needs:   . Film/video editor (Medical):   Marland Kitchen Lack of Transportation (Non-Medical):   Physical Activity:   . Days of Exercise per Week:   . Minutes of Exercise per Session:   Stress:   . Feeling of Stress :   Social Connections:   . Frequency of Communication with Friends and Family:   . Frequency of Social Gatherings with Friends and Family:   . Attends Religious Services:   . Active Member of Clubs or Organizations:   . Attends Archivist Meetings:   Marland Kitchen Marital Status:   Intimate Partner Violence:   . Fear of Current or Ex-Partner:   . Emotionally Abused:   Marland Kitchen Physically Abused:   . Sexually Abused:     Outpatient Medications Prior to Visit  Medication Sig Dispense Refill  . acetaminophen (TYLENOL) 500 MG tablet Take 1,000 mg by mouth. Take 1 to 2 tablets daily    . benazepril-hydrochlorthiazide (LOTENSIN HCT) 20-25 MG tablet TAKE 1 TABLET BY MOUTH DAILY. 90 tablet 1  . carvedilol (COREG) 6.25 MG tablet TAKE 1 TABLET BY MOUTH 2 TIMES DAILY WITH A MEAL. 180 tablet 3  . Cholecalciferol (VITAMIN D3) 125 MCG (5000 UT) TABS Take by mouth. Take 3 to 4 tablets daily    . cyanocobalamin (,VITAMIN B-12,) 1000 MCG/ML injection Inject 1 mL (1,000 mcg total) into the muscle every 21 ( twenty-one) days. 7 mL 1  . cyclobenzaprine (FLEXERIL) 10 MG tablet TAKE 1 TABLET BY MOUTH 3 TIMES DAILY AS NEEDED FOR MUSCLE SPASMS. 30 tablet 0  . diphenhydrAMINE (BENADRYL) 25 MG tablet Take 25 mg by mouth at bedtime as needed.     . ferrous sulfate 325 (65 FE) MG  tablet Take 325 mg by mouth daily with breakfast.    . furosemide (LASIX) 40 MG tablet Take 20 mg (0.5 tablet of 40 mg ) daily except 2 days a week take 40 mg and as directed 90 tablet 3  . gabapentin (NEURONTIN) 300 MG capsule Take 300 mg by mouth. Take 5 capsules in the morning and 4 capsules at bedtime.    Marland Kitchen levothyroxine (SYNTHROID) 125 MCG tablet Only take 1 tab Tues and Fridays 90 tablet 0  . levothyroxine (SYNTHROID) 150 MCG tablet  everyday except Tues and Friday- take those days 90 tablet 1  . Melatonin 5 MG CAPS Take 10 mg by mouth at bedtime.     . meloxicam (MOBIC) 15 MG tablet TAKE 1 TABLET BY MOUTH DAILY. 90 tablet 1  . pantoprazole (PROTONIX) 40 MG tablet TAKE 1 TABLET BY MOUTH DAILY. 90 tablet 0  . traMADol (ULTRAM) 50 MG tablet TAKE 1 TABLET BY MOUTH 2 TIMES DAILY. MAXIMUM 6 TABS PER DAY. 60 tablet 0  . atorvastatin (LIPITOR) 80 MG tablet Every other night 90 tablet 0   No facility-administered medications prior to visit.    Allergies  Allergen Reactions  . Cocoa Anaphylaxis  . Red Dye Hives    Rash and Nausea diarrhea  . Lyrica [Pregabalin] Other (See Comments)    Abnormal bleeding  . Sulfa Antibiotics Rash    ROS Review of Systems  A fourteen system review of systems was performed and found to be positive as per HPI.   Objective:    Physical Exam  General:  Well Developed, well nourished, appropriate for stated age.  Neuro:  Alert and oriented,  extra-ocular muscles intact  HEENT:  Normocephalic, atraumatic, neck supple, no carotid bruits appreciated  Skin:  no gross rash, warm, pink. Cardiac:  RRR, S1 S2, murmur present Respiratory:  Not using accessory muscles, speaking in full sentences- unlabored. Mild rales in lower lobes noted. No wheezing or crackles. Vascular:  Spider and varicose veins noted on B/L LE.; peripheral edema noted.  Psych:  No HI/SI, judgement and insight good, Euthymic mood. Full Affect.   BP 134/78   Pulse 68   Temp 98 F  (36.7 C) (Oral)   Ht 5\' 6"  (1.676 m)   Wt 228 lb 6.4 oz (103.6 kg)   SpO2 98% Comment: on RA  BMI 36.86 kg/m  Wt Readings from Last 3 Encounters:  04/13/20 228 lb 6.4 oz (103.6 kg)  01/29/20 236 lb (107 kg)  01/13/20 238 lb (108 kg)     Health Maintenance Due  Topic Date Due  . Hepatitis C Screening  Never done  . PNA vac Low Risk Adult (1 of 2 - PCV13) Never done  . COVID-19 Vaccine (2 - Pfizer 2-dose series) 01/31/2020    There are no preventive care reminders to display for this patient.  Lab Results  Component Value Date   TSH 1.380 02/16/2020   Lab Results  Component Value Date   WBC 5.5 11/03/2019   HGB 12.9 11/03/2019   HCT 38.2 11/03/2019   MCV 107 (H) 11/03/2019   PLT 264 11/03/2019   Lab Results  Component Value Date   NA 139 01/20/2020   K 4.5 01/20/2020   CO2 25 01/20/2020   GLUCOSE 94 01/20/2020   BUN 20 01/20/2020   CREATININE 1.15 (H) 01/20/2020   BILITOT 0.3 01/13/2020   ALKPHOS 120 (H) 01/13/2020   AST 26 01/13/2020   ALT 36 (H) 01/13/2020   PROT 5.7 (L) 01/13/2020   ALBUMIN 3.5 (L) 01/13/2020   CALCIUM 8.9 01/20/2020   ANIONGAP 7 04/26/2015   Lab Results  Component Value Date   CHOL 131 02/16/2020   Lab Results  Component Value Date   HDL 67 02/16/2020   Lab Results  Component Value Date   LDLCALC 36 02/16/2020   Lab Results  Component Value Date   TRIG 174 (H) 02/16/2020   Lab Results  Component Value Date   CHOLHDL 2.0 02/16/2020   Lab Results  Component Value  Date   HGBA1C 5.3 05/01/2019      Assessment & Plan:   Problem List Items Addressed This Visit      Cardiovascular and Mediastinum   Hypertension (Chronic)   Relevant Medications   rosuvastatin (CRESTOR) 5 MG tablet     Endocrine   Hypothyroidism (Chronic)     Nervous and Auditory   Peripheral neuropathy due to edema b/l feet (Chronic)     Other   Alcohol abuse-with elevated ALT and AST (Chronic)   HLD (hyperlipidemia) - Primary (Chronic)    Relevant Medications   rosuvastatin (CRESTOR) 5 MG tablet      Meds ordered this encounter  Medications  . rosuvastatin (CRESTOR) 5 MG tablet    Sig: Take 1 tablet (5 mg total) by mouth daily.    Dispense:  90 tablet    Refill:  1    Order Specific Question:   Supervising Provider    Answer:   Nani Gasser D [[id number]]   HLD: - Last lipid panel wnl's with exception of elevated Triglycerides. - Discontinue Lipitor 80 mg due to recurrent falls. - Discussed with patient risks vs benefits of Crestor given she has an allergy to red dye. Pt reports she can eat certain foods like tomatoes in moderation without any issues and really wants to give Crestor a try. Discussed anaphylactic symptoms to monitor for and to seek immediate medical care. Advised to discontinue medication if starts experiencing any allergic reaction such as a rash (hives) or angioedema. Pt verbalized understanding and all questions were answered. - Encourage to continue heart healthy diet and stay as active as possible. - Will recheck lipid panel and hepatic function at next OV.  HTN: - BP today is 134/78, at goal. - Continue current medication regimen. - Encourage to continue ambulatory BP and pulse monitoring. - Encourage to follow low salt diet. - Will recheck CMP at next OV to monitor medication therapy.   Peripheral Neuropathy: - Stable, continue Neurontin  Alcohol abuse: - Encourage to continue working on decreasing alcohol use and will recheck liver enzymes at next OV. - Last AST and ALT (3 mons ago) have improved. - Will continue to monitor.   B/L Lower extremity edema: - Most likely related to chronic venous insufficiency and advised to continue using compression socks and elevation.     Follow-up: Return for HLD, HTN, Hypothyroid, Neuropathy and FBW (lipids, CMP) in 4 mons.    Mayer Masker, PA-C

## 2020-05-04 ENCOUNTER — Other Ambulatory Visit: Payer: Self-pay | Admitting: Family Medicine

## 2020-05-05 ENCOUNTER — Other Ambulatory Visit: Payer: Self-pay | Admitting: Family Medicine

## 2020-05-06 ENCOUNTER — Other Ambulatory Visit: Payer: Self-pay | Admitting: Physician Assistant

## 2020-05-06 MED ORDER — PANTOPRAZOLE SODIUM 40 MG PO TBEC: 40.0000 mg | DELAYED_RELEASE_TABLET | Freq: Every day | ORAL | 0 refills | Status: DC

## 2020-05-06 NOTE — Telephone Encounter (Signed)
Patient is requesting a refill of her gabapentin and pantoprazole. If approved please send to [facility]

## 2020-05-06 NOTE — Telephone Encounter (Signed)
Patient has been prescribed Gabapentin by [place] in the past. Last rx sent: gabapentin (NEURONTIN) 300 MG capsule [[id number]] DISCONTINUED  Order Details Dose, Route, Frequency: As Directed  Dispense Quantity: 990 capsule Refills: 0       Sig: TAKE 4 CAPSULES BY MOUTH IN THE MORNING, 3 CAPSULES IN THE AFTERNOON, AND 4 CAPUSLES AT NIGHT.  Patient taking differently: TAKE 5 CAPSULES BY MOUTH IN THE MORNING, AND 4 CAPUSLES AT NIGHT.      Start Date: 01/09/20 End Date: 01/29/20  Discontinued by: Tobin Chad, RN on 01/29/2020 10:22  Reason: Change in therapy      Written Date: 01/09/20 Expiration Date: 01/08/21    Diagnosis Association: Peripheral neuropathy due to edema b/l feet  Original Order:  gabapentin (NEURONTIN) 300 MG capsule [[id number]]  Providers    RX was canceled by Cardio nurse on 01/29/20 and she entered: gabapentin (NEURONTIN) 300 MG capsule [[id number]]   Order Details Dose: 300 mg Route: Oral Frequency: --  Dispense Quantity: -- Refills: --       Sig: Take 300 mg by mouth. Take 5 capsules in the morning and 4 capsules at bedtime.      Start Date: -- End Date: --  Written Date: -- Expiration Date: --  Ordering Date: 01/29/20    Providers  Authorizing Provider: [provider] NPI: [id number]  DEA #: [id number]  Ordering User:  Tobin Chad, RN      Pharmacy  [facility] - [place], [place] - [address]  [address] [place] [address]  Phone:  [phone] Fax:  [phone]     Spoke with patient who states she is taking 5 capsules in the morning and 4 capsules at bedtime. She cut back because it causes diarrhea if she takes more.   Please approve refill if deemed appropriate. AS, CMA

## 2020-05-06 NOTE — Addendum Note (Signed)
Addended by: Sylvester Harder on: 05/06/2020 09:48 AM   Modules accepted: Orders

## 2020-05-07 ENCOUNTER — Other Ambulatory Visit: Payer: Self-pay | Admitting: Physician Assistant

## 2020-05-07 ENCOUNTER — Other Ambulatory Visit: Payer: Self-pay

## 2020-05-07 DIAGNOSIS — E039 Hypothyroidism, unspecified: Secondary | ICD-10-CM

## 2020-05-07 MED ORDER — GABAPENTIN 300 MG PO CAPS
ORAL_CAPSULE | ORAL | 0 refills | Status: DC
Start: 2020-05-07 — End: 2020-06-10

## 2020-05-07 MED ORDER — LEVOTHYROXINE SODIUM 150 MCG PO TABS: ORAL_TABLET | ORAL | 0 refills | Status: DC

## 2020-05-07 MED ORDER — LEVOTHYROXINE SODIUM 125 MCG PO TABS: ORAL_TABLET | ORAL | 0 refills | Status: DC

## 2020-05-07 NOTE — Telephone Encounter (Signed)
Patient came by office for status on Gabapentin refill  & also states she  Has just a couple of weeks of Levothyroxine left to and doesn't want to run out of it.  --Forwarding refill request for :   levothyroxine (SYNTHROID) 125 MCG tablet [[id number]]   Order Details Dose, Route, Frequency: As Directed  Dispense Quantity: 90 tablet Refills: 0       Sig: Only take 1 tab Tues and Fridays   &   levothyroxine (SYNTHROID) 150 MCG tablet [[id number]]   Order Details Dose, Route, Frequency: As Directed  Dispense Quantity: 90 tablet Refills: 1       Sig: everyday except Tues and Friday- take those days       --Pt uses :   [facility] - [place], [place] - [address] Phone:  [phone]  Fax:  [phone]     --Fausto Skillern

## 2020-05-19 ENCOUNTER — Other Ambulatory Visit: Payer: Self-pay | Admitting: Cardiology

## 2020-05-19 MED ORDER — FUROSEMIDE 40 MG PO TABS: 40.0000 mg | ORAL_TABLET | Freq: Every day | ORAL | 1 refills | Status: DC

## 2020-05-19 NOTE — Telephone Encounter (Signed)
*  STAT* If patient is at the pharmacy, call can be transferred to refill team.   1. Which medications need to be refilled? (please list name of each medication and dose if known)  Furosemide  2. Which pharmacy/location (including street and city if local pharmacy) is medication to be sent to? [facility]  3. Do they need a 30 day or 90 day supply? 90 days and refills

## 2020-05-24 ENCOUNTER — Emergency Department (HOSPITAL_COMMUNITY): Payer: Medicare PPO

## 2020-05-24 ENCOUNTER — Inpatient Hospital Stay (HOSPITAL_COMMUNITY)
Admission: EM | Admit: 2020-05-24 | Discharge: 2020-05-29 | DRG: 472 | Disposition: A | Discharge status: Rehabilitation Facility | Payer: Medicare PPO | Attending: Internal Medicine | Admitting: Internal Medicine

## 2020-05-24 ENCOUNTER — Other Ambulatory Visit: Payer: Self-pay

## 2020-05-24 ENCOUNTER — Other Ambulatory Visit: Payer: Self-pay | Admitting: Sports Medicine

## 2020-05-24 ENCOUNTER — Encounter (HOSPITAL_COMMUNITY): Payer: Self-pay | Admitting: Emergency Medicine

## 2020-05-24 DIAGNOSIS — E46 Unspecified protein-calorie malnutrition: Secondary | ICD-10-CM | POA: Diagnosis not present

## 2020-05-24 DIAGNOSIS — E782 Mixed hyperlipidemia: Secondary | ICD-10-CM | POA: Diagnosis present

## 2020-05-24 DIAGNOSIS — M4803 Spinal stenosis, cervicothoracic region: Secondary | ICD-10-CM | POA: Diagnosis not present

## 2020-05-24 DIAGNOSIS — M4713 Other spondylosis with myelopathy, cervicothoracic region: Secondary | ICD-10-CM | POA: Diagnosis present

## 2020-05-24 DIAGNOSIS — E8809 Other disorders of plasma-protein metabolism, not elsewhere classified: Secondary | ICD-10-CM | POA: Diagnosis not present

## 2020-05-24 DIAGNOSIS — I110 Hypertensive heart disease with heart failure: Secondary | ICD-10-CM | POA: Diagnosis present

## 2020-05-24 DIAGNOSIS — Z6836 Body mass index (BMI) 36.0-36.9, adult: Secondary | ICD-10-CM

## 2020-05-24 DIAGNOSIS — R531 Weakness: Secondary | ICD-10-CM | POA: Diagnosis not present

## 2020-05-24 DIAGNOSIS — G8314 Monoplegia of lower limb affecting left nondominant side: Secondary | ICD-10-CM | POA: Diagnosis present

## 2020-05-24 DIAGNOSIS — Z79890 Hormone replacement therapy (postmenopausal): Secondary | ICD-10-CM | POA: Diagnosis not present

## 2020-05-24 DIAGNOSIS — Z789 Other specified health status: Secondary | ICD-10-CM

## 2020-05-24 DIAGNOSIS — K5903 Drug induced constipation: Secondary | ICD-10-CM | POA: Diagnosis not present

## 2020-05-24 DIAGNOSIS — E039 Hypothyroidism, unspecified: Secondary | ICD-10-CM | POA: Diagnosis present

## 2020-05-24 DIAGNOSIS — Z79899 Other long term (current) drug therapy: Secondary | ICD-10-CM | POA: Diagnosis not present

## 2020-05-24 DIAGNOSIS — I10 Essential (primary) hypertension: Secondary | ICD-10-CM | POA: Diagnosis present

## 2020-05-24 DIAGNOSIS — L89892 Pressure ulcer of other site, stage 2: Secondary | ICD-10-CM | POA: Diagnosis present

## 2020-05-24 DIAGNOSIS — Z9884 Bariatric surgery status: Secondary | ICD-10-CM

## 2020-05-24 DIAGNOSIS — G8929 Other chronic pain: Secondary | ICD-10-CM | POA: Diagnosis present

## 2020-05-24 DIAGNOSIS — M5003 Cervical disc disorder with myelopathy, cervicothoracic region: Principal | ICD-10-CM | POA: Diagnosis present

## 2020-05-24 DIAGNOSIS — R29898 Other symptoms and signs involving the musculoskeletal system: Secondary | ICD-10-CM | POA: Diagnosis present

## 2020-05-24 DIAGNOSIS — F1010 Alcohol abuse, uncomplicated: Secondary | ICD-10-CM | POA: Diagnosis present

## 2020-05-24 DIAGNOSIS — I82453 Acute embolism and thrombosis of peroneal vein, bilateral: Secondary | ICD-10-CM | POA: Diagnosis not present

## 2020-05-24 DIAGNOSIS — M5410 Radiculopathy, site unspecified: Secondary | ICD-10-CM | POA: Diagnosis present

## 2020-05-24 DIAGNOSIS — G8918 Other acute postprocedural pain: Secondary | ICD-10-CM | POA: Diagnosis not present

## 2020-05-24 DIAGNOSIS — R2689 Other abnormalities of gait and mobility: Secondary | ICD-10-CM | POA: Diagnosis not present

## 2020-05-24 DIAGNOSIS — G629 Polyneuropathy, unspecified: Secondary | ICD-10-CM | POA: Diagnosis not present

## 2020-05-24 DIAGNOSIS — D7589 Other specified diseases of blood and blood-forming organs: Secondary | ICD-10-CM | POA: Diagnosis present

## 2020-05-24 DIAGNOSIS — E669 Obesity, unspecified: Secondary | ICD-10-CM | POA: Diagnosis present

## 2020-05-24 DIAGNOSIS — Z7289 Other problems related to lifestyle: Secondary | ICD-10-CM | POA: Diagnosis not present

## 2020-05-24 DIAGNOSIS — M5126 Other intervertebral disc displacement, lumbar region: Secondary | ICD-10-CM | POA: Diagnosis not present

## 2020-05-24 DIAGNOSIS — M4722 Other spondylosis with radiculopathy, cervical region: Secondary | ICD-10-CM | POA: Diagnosis not present

## 2020-05-24 DIAGNOSIS — G959 Disease of spinal cord, unspecified: Secondary | ICD-10-CM | POA: Diagnosis not present

## 2020-05-24 DIAGNOSIS — G9520 Unspecified cord compression: Secondary | ICD-10-CM | POA: Diagnosis not present

## 2020-05-24 DIAGNOSIS — L8990 Pressure ulcer of unspecified site, unspecified stage: Secondary | ICD-10-CM | POA: Insufficient documentation

## 2020-05-24 DIAGNOSIS — K219 Gastro-esophageal reflux disease without esophagitis: Secondary | ICD-10-CM | POA: Diagnosis present

## 2020-05-24 DIAGNOSIS — F411 Generalized anxiety disorder: Secondary | ICD-10-CM | POA: Diagnosis present

## 2020-05-24 DIAGNOSIS — M4802 Spinal stenosis, cervical region: Secondary | ICD-10-CM | POA: Diagnosis present

## 2020-05-24 DIAGNOSIS — M5000 Cervical disc disorder with myelopathy, unspecified cervical region: Secondary | ICD-10-CM | POA: Diagnosis not present

## 2020-05-24 DIAGNOSIS — M5020 Other cervical disc displacement, unspecified cervical region: Secondary | ICD-10-CM | POA: Diagnosis not present

## 2020-05-24 DIAGNOSIS — Z20822 Contact with and (suspected) exposure to covid-19: Secondary | ICD-10-CM | POA: Diagnosis present

## 2020-05-24 DIAGNOSIS — M5023 Other cervical disc displacement, cervicothoracic region: Secondary | ICD-10-CM | POA: Diagnosis not present

## 2020-05-24 DIAGNOSIS — Z981 Arthrodesis status: Secondary | ICD-10-CM | POA: Diagnosis not present

## 2020-05-24 DIAGNOSIS — I272 Pulmonary hypertension, unspecified: Secondary | ICD-10-CM | POA: Diagnosis present

## 2020-05-24 DIAGNOSIS — M48061 Spinal stenosis, lumbar region without neurogenic claudication: Secondary | ICD-10-CM | POA: Diagnosis not present

## 2020-05-24 DIAGNOSIS — F1090 Alcohol use, unspecified, uncomplicated: Secondary | ICD-10-CM

## 2020-05-24 DIAGNOSIS — E785 Hyperlipidemia, unspecified: Secondary | ICD-10-CM | POA: Diagnosis present

## 2020-05-24 DIAGNOSIS — M6281 Muscle weakness (generalized): Secondary | ICD-10-CM | POA: Diagnosis not present

## 2020-05-24 DIAGNOSIS — Z419 Encounter for procedure for purposes other than remedying health state, unspecified: Secondary | ICD-10-CM

## 2020-05-24 DIAGNOSIS — I5032 Chronic diastolic (congestive) heart failure: Secondary | ICD-10-CM | POA: Diagnosis present

## 2020-05-24 DIAGNOSIS — M5013 Cervical disc disorder with radiculopathy, cervicothoracic region: Secondary | ICD-10-CM | POA: Diagnosis present

## 2020-05-24 DIAGNOSIS — M170 Bilateral primary osteoarthritis of knee: Secondary | ICD-10-CM

## 2020-05-24 DIAGNOSIS — F419 Anxiety disorder, unspecified: Secondary | ICD-10-CM | POA: Diagnosis not present

## 2020-05-24 DIAGNOSIS — R400 Somnolence: Secondary | ICD-10-CM | POA: Diagnosis present

## 2020-05-24 DIAGNOSIS — M47816 Spondylosis without myelopathy or radiculopathy, lumbar region: Secondary | ICD-10-CM | POA: Diagnosis not present

## 2020-05-24 LAB — DIFFERENTIAL
Abs Immature Granulocytes: 0.01 10*3/uL (ref 0.00–0.07)
Basophils Absolute: 0.1 10*3/uL (ref 0.0–0.1)
Basophils Relative: 2 %
Eosinophils Absolute: 0.2 10*3/uL (ref 0.0–0.5)
Eosinophils Relative: 4 %
Immature Granulocytes: 0 %
Lymphocytes Relative: 36 %
Lymphs Abs: 1.9 10*3/uL (ref 0.7–4.0)
Monocytes Absolute: 0.6 10*3/uL (ref 0.1–1.0)
Monocytes Relative: 11 %
Neutro Abs: 2.5 10*3/uL (ref 1.7–7.7)
Neutrophils Relative %: 47 %

## 2020-05-24 LAB — CBC
HCT: 40.9 % (ref 36.0–46.0)
Hemoglobin: 13.3 g/dL (ref 12.0–15.0)
MCH: 35.5 pg — ABNORMAL HIGH (ref 26.0–34.0)
MCHC: 32.5 g/dL (ref 30.0–36.0)
MCV: 109.1 fL — ABNORMAL HIGH (ref 80.0–100.0)
Platelets: 226 10*3/uL (ref 150–400)
RBC: 3.75 MIL/uL — ABNORMAL LOW (ref 3.87–5.11)
RDW: 12.8 % (ref 11.5–15.5)
WBC: 5.2 10*3/uL (ref 4.0–10.5)
nRBC: 0 % (ref 0.0–0.2)

## 2020-05-24 LAB — COMPREHENSIVE METABOLIC PANEL
ALT: 19 U/L (ref 0–44)
AST: 26 U/L (ref 15–41)
Albumin: 3.4 g/dL — ABNORMAL LOW (ref 3.5–5.0)
Alkaline Phosphatase: 80 U/L (ref 38–126)
Anion gap: 9 (ref 5–15)
BUN: 14 mg/dL (ref 8–23)
CO2: 26 mmol/L (ref 22–32)
Calcium: 9 mg/dL (ref 8.9–10.3)
Chloride: 98 mmol/L (ref 98–111)
Creatinine, Ser: 0.94 mg/dL (ref 0.44–1.00)
GFR calc Af Amer: >60 mL/min (ref >60)
GFR calc non Af Amer: >60 mL/min (ref >60)
Glucose, Bld: 103 mg/dL — ABNORMAL HIGH (ref 70–99)
Potassium: 4.2 mmol/L (ref 3.5–5.1)
Sodium: 133 mmol/L — ABNORMAL LOW (ref 135–145)
Total Bilirubin: 0.7 mg/dL (ref 0.3–1.2)
Total Protein: 6.3 g/dL — ABNORMAL LOW (ref 6.5–8.1)

## 2020-05-24 LAB — PROTIME-INR
INR: 1.1 (ref 0.8–1.2)
Prothrombin Time: 13.4 seconds (ref 11.4–15.2)

## 2020-05-24 LAB — APTT: aPTT: 31 seconds (ref 24–36)

## 2020-05-24 IMAGING — CT CT HEAD W/O CM
4 series · 15 of 47 positions shown, 17 images · non-contrast
Comparison: [DATE]

CLINICAL DATA: LEFT leg weakness since [REDACTED]

EXAM:
CT HEAD WITHOUT CONTRAST
TECHNIQUE: Contiguous axial images were obtained from the base of the skull
through the vertex without intravenous contrast.

[Series 3: head without · axial · non-contrast · 0.47mm/px · z∈[-43,+87]mm · 7 of 36 slices shown, 9 images]
[im 5/36  brain]
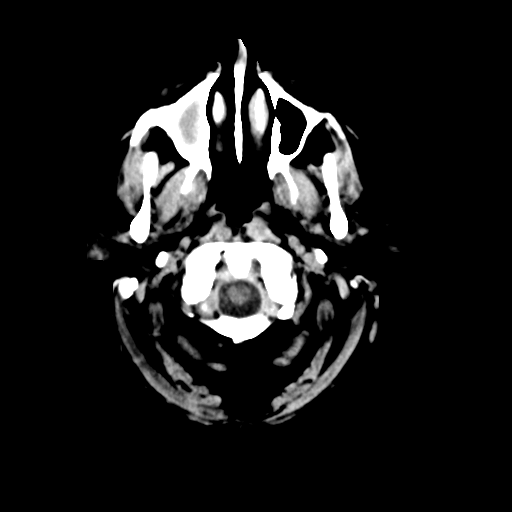
[im 5/36  bone]
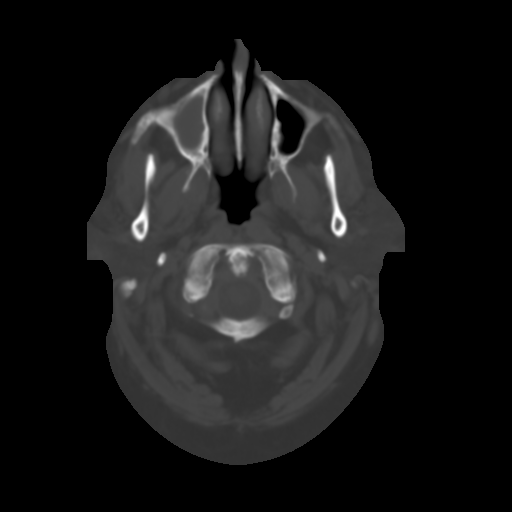
[im 9/36  brain]
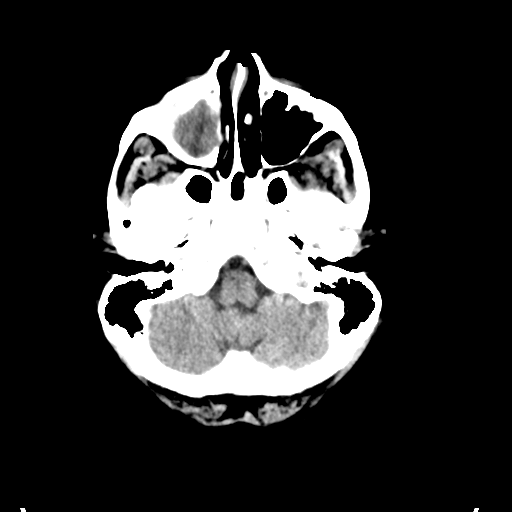
[im 14/36  brain]
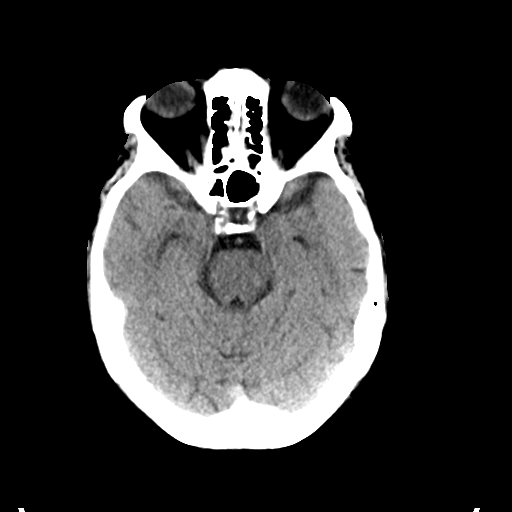
[im 18/36  brain]
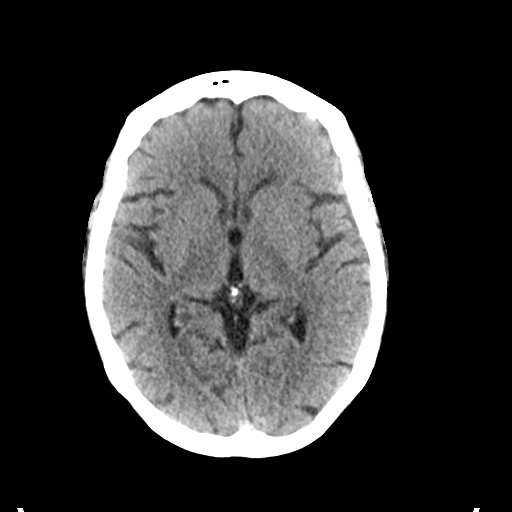
[im 22/36  brain]
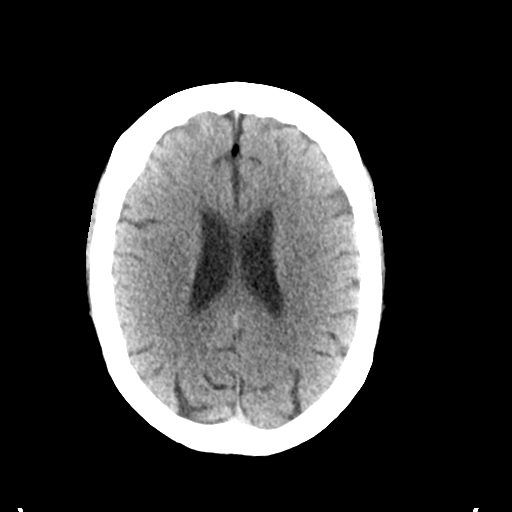
[im 22/36  bone]
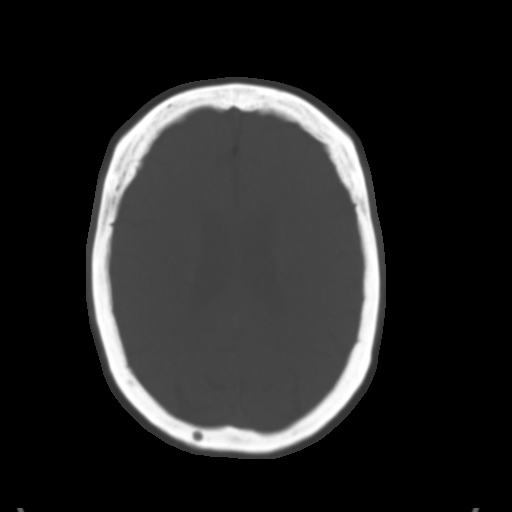
[im 27/36  brain]
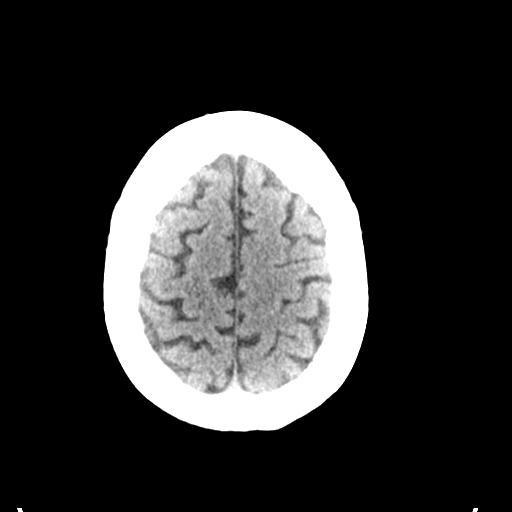
[im 31/36  brain]
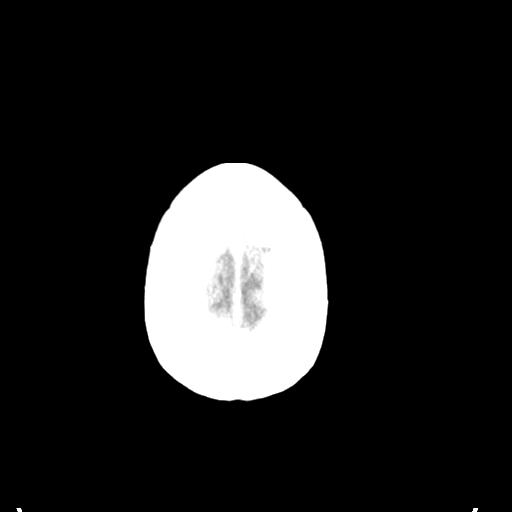

[Series 4: head bone · axial · 0.47mm/px · z∈[-47,-29]mm · 2 of 89 slices shown]
[im 9/89  bone]
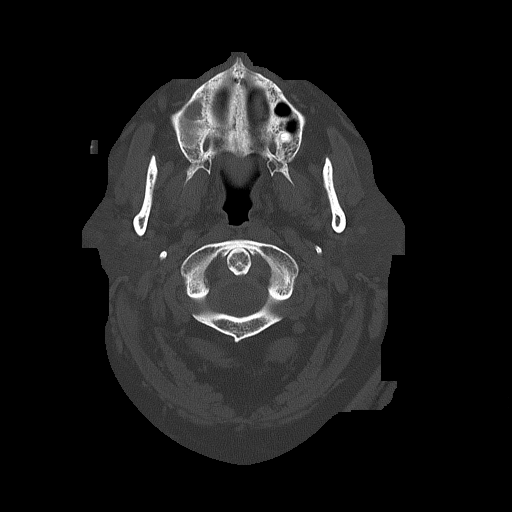
[im 18/89  bone]
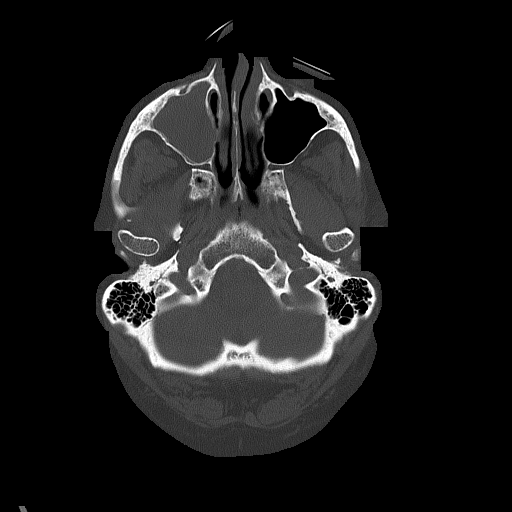

[Series 5: head without cor · coronal · non-contrast · 0.35mm/px · 3 of 78 slices shown]
[im 26/78  brain]
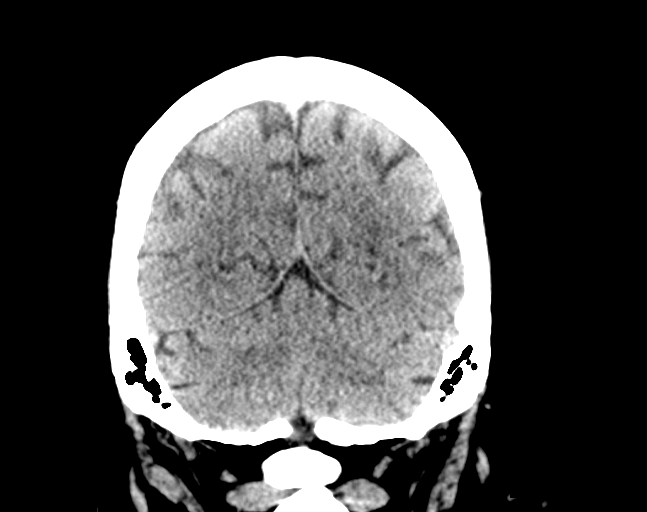
[im 35/78  brain]
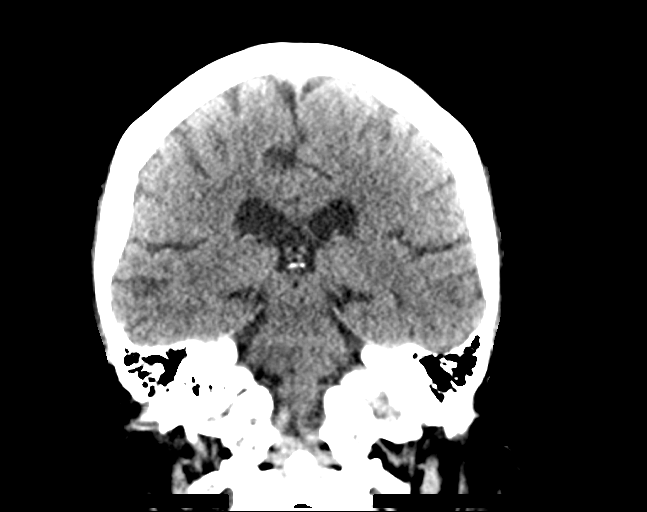
[im 43/78  brain]
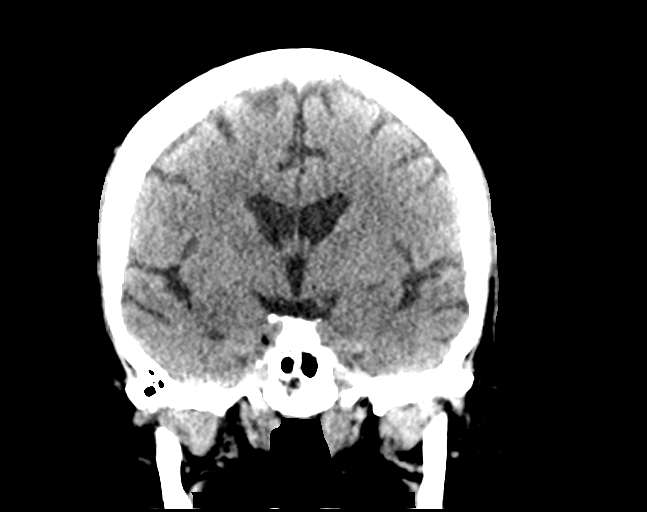

[Series 6: head without sag · sagittal · non-contrast · 0.35mm/px · 3 of 67 slices shown]
[im 23/67  brain]
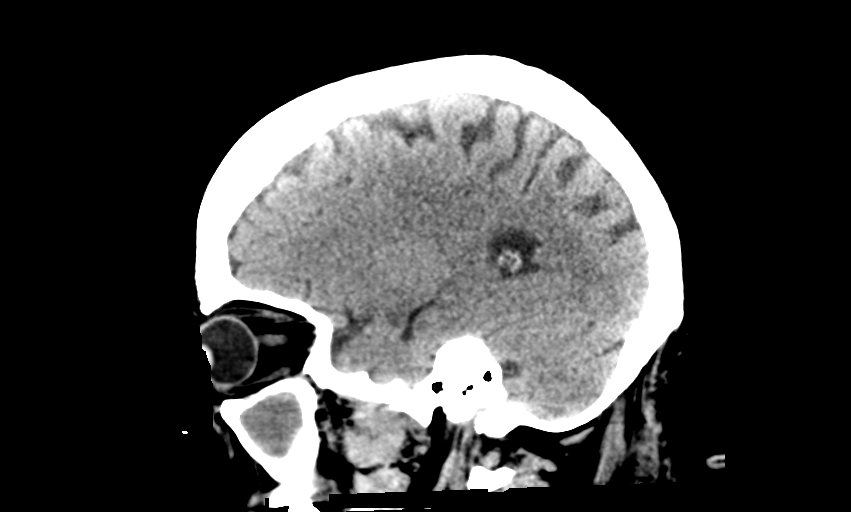
[im 34/67  brain]
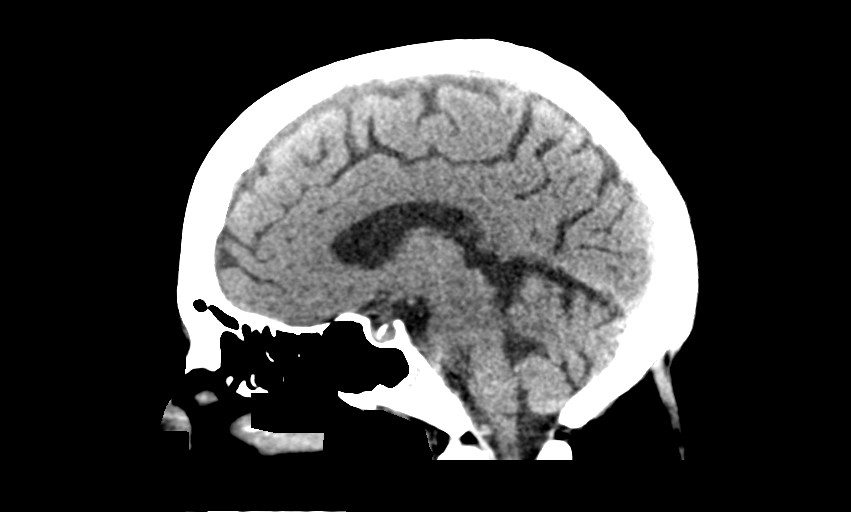
[im 45/67  brain]
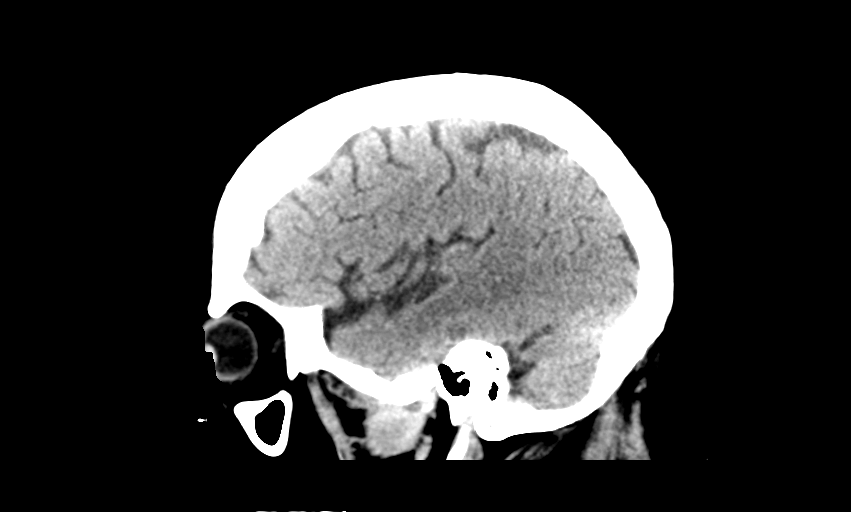

[15 of 47 positions shown; findings below may reference images not displayed]

FINDINGS: Brain: No evidence of acute infarction, hemorrhage, hydrocephalus,
extra-axial collection or mass lesion/mass effect.

Vascular: No hyperdense vessel or unexpected calcification.

Skull: Normal. Negative for fracture or focal lesion.

Sinuses/Orbits: Chronic opacification of the RIGHT maxillary sinus
with similar appearance to the previous imaging study. Orbits are
unremarkable.

Other: None.
IMPRESSION: 1. No acute intracranial abnormality.
2. Chronic RIGHT maxillary sinus disease.
3. Signs of trauma to the RIGHT nasal bone with similar appearance
in terms of deformity.

## 2020-05-24 MED ORDER — FOLIC ACID 1 MG PO TABS
1.0000 mg | ORAL_TABLET | Freq: Every day | ORAL | Status: DC
  Administered 2020-05-27: 1 mg via ORAL
  Filled 2020-05-24 (×3): qty 1

## 2020-05-24 MED ORDER — GABAPENTIN 400 MG PO CAPS
1200.0000 mg | ORAL_CAPSULE | Freq: Every day | ORAL | Status: DC
  Administered 2020-05-25 – 2020-05-28 (×4): 1200 mg via ORAL
  Filled 2020-05-24: qty 4
  Filled 2020-05-24 (×3): qty 3

## 2020-05-24 MED ORDER — GABAPENTIN 400 MG PO CAPS
1500.0000 mg | ORAL_CAPSULE | Freq: Every day | ORAL | Status: DC
  Administered 2020-05-25 – 2020-05-29 (×5): 1500 mg via ORAL
  Filled 2020-05-24: qty 5
  Filled 2020-05-24 (×3): qty 3
  Filled 2020-05-24: qty 1

## 2020-05-24 MED ORDER — ASPIRIN 81 MG PO CHEW
324.0000 mg | CHEWABLE_TABLET | Freq: Once | ORAL | Status: DC
  Filled 2020-05-24: qty 4

## 2020-05-24 MED ORDER — PANTOPRAZOLE SODIUM 40 MG PO TBEC
40.0000 mg | DELAYED_RELEASE_TABLET | Freq: Every day | ORAL | Status: DC
  Administered 2020-05-25 – 2020-05-26 (×2): 40 mg via ORAL
  Filled 2020-05-24 (×2): qty 1

## 2020-05-24 MED ORDER — ENOXAPARIN SODIUM 40 MG/0.4ML ~~LOC~~ SOLN
40.0000 mg | SUBCUTANEOUS | Status: DC
  Administered 2020-05-25 (×2): 40 mg via SUBCUTANEOUS
  Filled 2020-05-24 (×2): qty 0.4

## 2020-05-24 MED ORDER — THIAMINE HCL 100 MG/ML IJ SOLN: 100.0000 mg | Freq: Every day | INTRAMUSCULAR | Status: DC

## 2020-05-24 MED ORDER — LEVOTHYROXINE SODIUM 25 MCG PO TABS
125.0000 ug | ORAL_TABLET | ORAL | Status: DC
  Administered 2020-05-25 – 2020-05-28 (×2): 125 ug via ORAL
  Filled 2020-05-24 (×2): qty 1

## 2020-05-24 MED ORDER — LORAZEPAM 1 MG PO TABS
1.0000 mg | ORAL_TABLET | ORAL | Status: AC | PRN
  Administered 2020-05-26: 1 mg via ORAL
  Filled 2020-05-24: qty 1

## 2020-05-24 MED ORDER — FERROUS SULFATE 325 (65 FE) MG PO TABS
325.0000 mg | ORAL_TABLET | Freq: Every day | ORAL | Status: DC
  Administered 2020-05-25 – 2020-05-29 (×5): 325 mg via ORAL
  Filled 2020-05-24 (×5): qty 1

## 2020-05-24 MED ORDER — LORAZEPAM 2 MG/ML IJ SOLN: 1.0000 mg | INTRAMUSCULAR | Status: AC | PRN

## 2020-05-24 MED ORDER — LEVOTHYROXINE SODIUM 75 MCG PO TABS
150.0000 ug | ORAL_TABLET | ORAL | Status: DC
  Administered 2020-05-26 – 2020-05-29 (×3): 150 ug via ORAL
  Filled 2020-05-24 (×3): qty 2

## 2020-05-24 MED ORDER — SODIUM CHLORIDE 0.9% FLUSH
3.0000 mL | Freq: Once | INTRAVENOUS | Status: AC
Start: 2020-05-24 — End: 2020-05-25
  Administered 2020-05-25: 3 mL via INTRAVENOUS

## 2020-05-24 MED ORDER — ACETAMINOPHEN 325 MG PO TABS
650.0000 mg | ORAL_TABLET | Freq: Four times a day (QID) | ORAL | Status: DC | PRN
  Administered 2020-05-25 – 2020-05-26 (×3): 650 mg via ORAL
  Filled 2020-05-24 (×5): qty 2

## 2020-05-24 MED ORDER — ROSUVASTATIN CALCIUM 5 MG PO TABS
5.0000 mg | ORAL_TABLET | Freq: Every day | ORAL | Status: DC
  Administered 2020-05-25 – 2020-05-29 (×5): 5 mg via ORAL
  Filled 2020-05-24 (×6): qty 1

## 2020-05-24 MED ORDER — ACETAMINOPHEN 650 MG RE SUPP: 650.0000 mg | Freq: Four times a day (QID) | RECTAL | Status: DC | PRN

## 2020-05-24 MED ORDER — THIAMINE HCL 100 MG PO TABS
100.0000 mg | ORAL_TABLET | Freq: Every day | ORAL | Status: DC
  Administered 2020-05-26 – 2020-05-27 (×2): 100 mg via ORAL
  Filled 2020-05-24 (×5): qty 1

## 2020-05-24 NOTE — ED Provider Notes (Signed)
[facility] EMERGENCY DEPARTMENT Provider Note   CSN: [id number] Arrival date & time: 05/24/20  1542     History Chief Complaint  Patient presents with  . Weakness    [name] is a 74 y.o. female.  HPI   73yF with LLE weakness. Noticed when she woke up Friday morning. It has been persistent since then. Normally uses a cane for stability when she it out of her home. Since Friday she feels like her leg has been dragging and got out her mother's old walker to use even around the house. No change in speech. No confusion per husband. Denies any back pain. Chronic neck pain which has been stable.   Past Medical History:  Diagnosis Date  . Alcohol abuse 04/25/2015  . Allergy   . Anastomotic ulcer S/P gastric bypass 06/18/2016  . Arthritis   . Cataract   . Degenerative disc disease, cervical   . GAD (generalized anxiety disorder) 06/14/2016  . HTN (hypertension) 04/25/2015  . Hypertension   . Hypothyroidism   . 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 Substance abuse (HCC)    Alcohol abuse 2016  . UGIB (upper gastrointestinal bleed) 04/25/2015    Patient Active Problem List   Diagnosis Date Noted  . Counseling on health promotion and disease prevention 11/09/2019  . Balance problems 11/06/2019  . Side effect of medication- balance issues with statin 11/06/2019  . Acute renal failure (HCC) 11/06/2019  . Umbilical hernia without obstruction and without gangrene 11/06/2019  . Venous stasis of both lower extremities 01/10/2019  . Weakness generalized 09/09/2018  . Chronic venous stasis dermatitis of both lower extremities 03/11/2018  . B12 deficiency 03/04/2018  . Cellulitis of right leg 12/18/2017  . Peripheral neuropathy due to edema b/l feet 12/03/2017  . Decreased pedal pulses 10/27/2017  . Acute on chronic diastolic heart failure (HCC) 10/25/2017  . Aortic systolic murmur on examination 10/02/2017    . Preoperative cardiovascular examination 10/02/2017  . Exertional dyspnea 09/25/2017  . Bilateral lower extremity edema 11/07/2016  . Primary osteoarthritis of both knees 11/02/2016  . Hypertriglyceridemia 09/29/2016  . HLD (hyperlipidemia) 09/29/2016  . Vitamin D deficiency 09/29/2016  . Adjustment disorder with mixed anxiety and depressed mood 09/29/2016  . Morbid obesity (HCC) 06/18/2016  . R Congenital Kidney ABN: Ess only has one fxn kidney 06/18/2016  . History of hysterectomy 06/18/2016  . H/O gastric bypass 06/18/2016  . Peripheral edema- b/l L Ext w chronic skin changes 06/18/2016  . Insomnia due to alcohol excess and stress 06/18/2016  . GAD (generalized anxiety disorder) 06/14/2016  . h/o Iron deficiency anemia 06/14/2016  . Muscle spasm of back 06/14/2016  . Hypothyroidism 02/13/2016  . Stress at home 02/13/2016  . UGIB (upper gastrointestinal bleed) 04/25/2015  . Hypertension 04/25/2015  . Alcohol abuse-with elevated ALT and AST 04/25/2015  . Acute ulcer at gastrojejunal region- post gastric bypass   . Distal radius fracture, right 01/15/2013    Past Surgical History:  Procedure Laterality Date  . CHOLECYSTECTOMY    . COSMETIC SURGERY    . DIAGNOSTIC LAPAROSCOPY    . ESOPHAGOGASTRODUODENOSCOPY N/A 04/25/2015   Procedure: ESOPHAGOGASTRODUODENOSCOPY (EGD);  Surgeon: Beverley Fiedler, MD;  Location: [facility] ENDOSCOPY;  Service: Endoscopy;  Laterality: N/A;  . FRACTURE SURGERY    . GASTRIC BYPASS    . NM MYOVIEW LTD  10/10/2017   Normal LV size and function EF 72%.  Medium sized  mild severity defect in the apical septal apical lateral and apical wall suggestive of breast attenuation.  LOW RISK.  No ischemia or infarction.    . OPEN REDUCTION INTERNAL FIXATION (ORIF) DISTAL RADIAL FRACTURE Right 01/15/2013   Procedure: OPEN REDUCTION INTERNAL FIXATION (ORIF) Right DISTAL RADIUS FRACTURE;  Surgeon: Eldred Manges, MD;  Location: [facility] OR;  Service: Orthopedics;  Laterality: Right;  .  TRANSTHORACIC ECHOCARDIOGRAM  10/04/2017   Mild LVH.  EF 60-65%.  Grade 2/moderate diastolic dysfunction.  Mild pulmonary hypertension.  No valve lesions     OB History   No obstetric history on file.     Family History  Problem Relation Age of Onset  . Heart disease Mother   . Stroke Mother 83  . Cancer Mother        skin and breast  . Hypertension Mother   . Breast cancer Mother 40  . COPD Father   . Cancer Father   . Diabetes Father   . Hypertension Father   . Stroke Maternal Grandmother   . Mental illness Maternal Grandmother   . Breast cancer Sister   . Cancer Cousin 110    Social History   Tobacco Use  . Smoking status: Never Smoker  . Smokeless tobacco: Never Used  Substance Use Topics  . Alcohol use: Yes    Alcohol/week: 28.0 standard drinks    Types: 28 Glasses of wine per week    Comment: drinks daily and has a bottle of wine per day  . Drug use: No    Home Medications Prior to Admission medications   Medication Sig Start Date End Date Taking? Authorizing Provider  acetaminophen (TYLENOL) 500 MG tablet Take 500 mg by mouth every 6 (six) hours as needed for moderate pain. Take 1 to 2 tablets daily   Yes [provider]  benazepril-hydrochlorthiazide (LOTENSIN HCT) 20-25 MG tablet TAKE 1 TABLET BY MOUTH DAILY. 12/22/19  Yes Opalski, Deborah, DO  carvedilol (COREG) 6.25 MG tablet TAKE 1 TABLET BY MOUTH 2 TIMES DAILY WITH A MEAL. Patient taking differently: Take 6.25 mg by mouth 2 (two) times daily with a meal.  11/11/19  Yes Marykay Lex, MD  Cholecalciferol (VITAMIN D3) 125 MCG (5000 UT) TABS Take 15,000 Units by mouth See admin instructions. Takes 93716 units 5 days a week and 20000 units on 2 days.   Yes [provider]  Cyanocobalamin (VITAMIN B 12 PO) Take 2,500 mg by mouth once a week.   Yes [provider]  cyclobenzaprine (FLEXERIL) 10 MG tablet TAKE 1 TABLET BY MOUTH 3 TIMES DAILY AS NEEDED FOR MUSCLE SPASMS. Patient  taking differently: Take 10 mg by mouth daily as needed for muscle spasms. . 03/03/20  Yes Opalski, Deborah, DO  diphenhydrAMINE (BENADRYL) 25 MG tablet Take 25 mg by mouth at bedtime as needed for sleep.    Yes [provider]  ferrous sulfate 325 (65 FE) MG tablet Take 325 mg by mouth daily with breakfast.   Yes [provider]  cyanocobalamin (,VITAMIN B-12,) 1000 MCG/ML injection Inject 1 mL (1,000 mcg total) into the muscle every 21 ( twenty-one) days. Patient not taking: Reported on 05/24/2020 05/08/19   Thomasene Lot, DO  furosemide (LASIX) 40 MG tablet Take 1 tablet (40 mg total) by mouth daily. 05/19/20   Marykay Lex, MD  gabapentin (NEURONTIN) 300 MG capsule Take 300 mg by mouth. Take 5 capsules in the morning and 4 capsules at bedtime.    [provider]  gabapentin (NEURONTIN) 300 MG capsule Take 4 capsules by mouth every morning, 3 capsules in the afternoon, and 4 capsules at night 05/07/20   [name], [name], PA-C  levothyroxine (SYNTHROID) 125 MCG tablet Only take 1 tab Tues and Fridays 05/07/20   Mayer Masker, PA-C  levothyroxine (SYNTHROID) 150 MCG tablet everyday except Tues and Friday- take those days 05/07/20   Mayer Masker, PA-C  Melatonin 5 MG CAPS Take 10 mg by mouth at bedtime.     [provider]  meloxicam (MOBIC) 15 MG tablet TAKE 1 TABLET BY MOUTH DAILY. 12/15/19   Monica Becton, MD  pantoprazole (PROTONIX) 40 MG tablet Take 1 tablet (40 mg total) by mouth daily. 05/06/20   Mayer Masker, PA-C  rosuvastatin (CRESTOR) 5 MG tablet Take 1 tablet (5 mg total) by mouth daily. 04/13/20   [name], Kandis Cocking, PA-C  traMADol (ULTRAM) 50 MG tablet TAKE 1 TABLET BY MOUTH 2 TIMES DAILY. MAXIMUM 6 TABLETS PER DAY AS DIRECTED BY PRESCRIBER 05/24/20   Monica Becton, MD    Allergies    Cocoa, Red dye, Lyrica [pregabalin], Other, and Sulfa antibiotics  Review of Systems   Review of Systems All systems reviewed and negative,  other than as noted in HPI.  Physical Exam Updated Vital Signs BP (!) 163/90   Pulse 78   Temp 98.2 F (36.8 C) (Oral)   Resp (!) 24   Ht 5' 5.5" (1.664 m)   Wt 102.1 kg   SpO2 96%   BMI 36.87 kg/m   Physical Exam Vitals and nursing note reviewed.  Constitutional:      General: She is not in acute distress.    Appearance: She is well-developed.  HENT:     Head: Normocephalic and atraumatic.  Eyes:     General:        Right eye: No discharge.        Left eye: No discharge.     Conjunctiva/sclera: Conjunctivae normal.  Cardiovascular:     Rate and Rhythm: Normal rate and regular rhythm.     Heart sounds: Normal heart sounds. No murmur heard.  No friction rub. No gallop.   Pulmonary:     Effort: Pulmonary effort is normal. No respiratory distress.     Breath sounds: Normal breath sounds.  Abdominal:     General: There is no distension.     Palpations: Abdomen is soft.     Tenderness: There is no abdominal tenderness.  Musculoskeletal:        General: No tenderness.     Cervical back: Neck supple.     Right lower leg: Edema present.     Left lower leg: Edema present.  Skin:    General: Skin is warm and dry.  Neurological:     Mental Status: She is alert and oriented to person, place, and time.     Cranial Nerves: No cranial nerve deficit.     Comments: Strength 2/5 LLE. Seems weaker distally than proximally. Normal/symmetric patellar reflexes. Strength 5/5 UE.   Psychiatric:        Behavior: Behavior normal.        Thought Content: Thought content normal.     ED Results / Procedures / Treatments   Labs (all labs ordered are listed, but only abnormal results are displayed) Labs Reviewed  CBC - Abnormal; Notable for the following components:      Result Value   RBC 3.75 (*)    MCV 109.1 (*)  MCH 35.5 (*)    All other components within normal limits  COMPREHENSIVE METABOLIC PANEL - Abnormal; Notable for the following components:   Sodium 133 (*)     Glucose, Bld 103 (*)    Total Protein 6.3 (*)    Albumin 3.4 (*)    All other components within normal limits  URINALYSIS, ROUTINE W REFLEX MICROSCOPIC - Abnormal; Notable for the following components:   Hgb urine dipstick SMALL (*)    Bacteria, UA RARE (*)    All other components within normal limits  VITAMIN B12 - Abnormal; Notable for the following components:   Vitamin B-12 1,312 (*)    All other components within normal limits  SARS CORONAVIRUS 2 BY RT PCR (HOSPITAL ORDER, [facility])  PROTIME-[name]  [name]  DIFFERENTIAL  MAGNESIUM  PHOSPHORUS  FOLATE  CBG MONITORING, ED    EKG EKG Interpretation  Date/Time:  Monday May 24 2020 15:48:17 EDT Ventricular Rate:  72 PR Interval:  190 QRS Duration: 84 QT Interval:  406 QTC Calculation: 444 R Axis:   69 Text Interpretation: Normal sinus rhythm Cannot rule out Anterior infarct , age undetermined Abnormal ECG Confirmed by Virgel Manifold [phone]) on 05/24/2020 9:49:37 PM   Radiology CT HEAD WO CONTRAST  Result Date: 05/24/2020 CLINICAL DATA:  LEFT leg weakness since Friday EXAM: CT HEAD WITHOUT CONTRAST TECHNIQUE: Contiguous axial images were obtained from the base of the skull through the vertex without intravenous contrast. COMPARISON:  December 04, 2018 FINDINGS: Brain: No evidence of acute infarction, hemorrhage, hydrocephalus, extra-axial collection or mass lesion/mass effect. Vascular: No hyperdense vessel or unexpected calcification. Skull: Normal. Negative for fracture or focal lesion. Sinuses/Orbits: Chronic opacification of the RIGHT maxillary sinus with similar appearance to the previous imaging study. Orbits are unremarkable. Other: None. IMPRESSION: 1. No acute intracranial abnormality. 2. Chronic RIGHT maxillary sinus disease. 3. Signs of trauma to the RIGHT nasal bone with similar appearance in terms of deformity. Electronically Signed   By: Zetta Bills M.D.   On: 05/24/2020 17:45     Procedures Procedures (including critical care time)  Medications Ordered in ED Medications  sodium chloride flush (NS) 0.9 % injection 3 mL (has no administration in time range)    ED Course  I have reviewed the triage vital signs and the nursing notes.  Pertinent labs & imaging results that were available during my care of the patient were reviewed by me and considered in my medical decision making (see chart for details).    MDM Rules/Calculators/A&P                          73yF with LLE weakness. Woke up with symptoms Friday morning. Persistent since. No back pain. Consider CVA versus peripheral problem. Denies back pain. Discussed with neurology briefly. Recommending MR brain first prior to pursuing possible peripheral cause.   Final Clinical Impression(s) / ED Diagnoses Final diagnoses:  Left leg weakness    Rx / DC Orders ED Discharge Orders    None       Virgel Manifold, MD 05/25/20 1640

## 2020-05-24 NOTE — ED Triage Notes (Signed)
Patient arrives to ED with complaints of left leg weakness since Friday. Patient states that she is now not able to move her leg up and down and that the weakness has gotten worse everyday since. No vision or speech abnormalities. Patient states the sensory in the leg may be a little off but the leg is not numb. Foot warm to touch with palpable pulses.

## 2020-05-24 NOTE — H&P (Signed)
History and Physical    [name] HEN:[id number] DOB: 1946/09/02 DOA: 05/24/2020  PCP: Lorrene Reid, PA-C Patient coming from: Home  Chief Complaint: Left leg weakness  HPI: [name] is a 74 y.o. female with medical history significant of hypertension, hyperlipidemia, hypothyroidism, chronic diastolic CHF, venous stasis, alcohol use disorder presenting to the ED with complaints of left leg weakness.  Patient states she normally uses a walker when outside the house but does not require it while walking at home.  Since 6/25 morning when she woke up she noticed that her left leg was weak.  She is able to flex her thigh but the leg is weak and slightly numb from the knee down.  The weakness has gotten progressively worse and today she had to call EMS as she was unable to walk to her car.  Reports history of herniated cervical disks but denies upper extremity weakness or numbness.  Denies fevers or lower back pain.  Denies saddle anesthesia or loss of bowel/bladder control.  Reports having some chronic stress incontinence but no recent change from her baseline.  Denies history of prior stroke.  Denies experiencing any change in her vision, facial droop, or difficulty with speech.  She has been vaccinated for Covid.  Denies cough or shortness of breath.  ED Course: Blood pressure slightly elevated, remainder of vital signs stable.  Labs showing no leukocytosis.  Head CT negative for acute intracranial abnormality.  Review of Systems:  All systems reviewed and apart from history of presenting illness, are negative.  Past Medical History:  Diagnosis Date  . Alcohol abuse 04/25/2015  . Allergy   . Anastomotic ulcer S/P gastric bypass 06/18/2016  . Arthritis   . Cataract   . Degenerative disc disease, cervical   . GAD (generalized anxiety disorder) 06/14/2016  . HTN (hypertension) 04/25/2015  . Hypertension   . Hypothyroidism   . 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 Substance abuse ([place])    Alcohol abuse 2016  . UGIB (upper gastrointestinal bleed) 04/25/2015    Past Surgical History:  Procedure Laterality Date  . CHOLECYSTECTOMY    . COSMETIC SURGERY    . DIAGNOSTIC LAPAROSCOPY    . ESOPHAGOGASTRODUODENOSCOPY N/A 04/25/2015   Procedure: ESOPHAGOGASTRODUODENOSCOPY (EGD);  Surgeon: Jerene Bears, MD;  Location: [facility] ENDOSCOPY;  Service: Endoscopy;  Laterality: N/A;  . FRACTURE SURGERY    . GASTRIC BYPASS    . NM MYOVIEW LTD  10/10/2017   Normal LV size and function EF 72%.  Medium sized mild severity defect in the apical septal apical lateral and apical wall suggestive of breast attenuation.  LOW RISK.  No ischemia or infarction.    . OPEN REDUCTION INTERNAL FIXATION (ORIF) DISTAL RADIAL FRACTURE Right 01/15/2013   Procedure: OPEN REDUCTION INTERNAL FIXATION (ORIF) Right DISTAL RADIUS FRACTURE;  Surgeon: Marybelle Killings, MD;  Location: [place];  Service: Orthopedics;  Laterality: Right;  . TRANSTHORACIC ECHOCARDIOGRAM  10/04/2017   Mild LVH.  EF 60-65%.  Grade 2/moderate diastolic dysfunction.  Mild pulmonary hypertension.  No valve lesions     reports that she has never smoked. She has never used smokeless tobacco. She reports current alcohol use of about 28.0 standard drinks of alcohol per week. She reports that she does not use drugs.  Allergies  Allergen Reactions  . Cocoa Anaphylaxis  . Red Dye Hives    Rash and Nausea diarrhea  . Lyrica [Pregabalin] Other (See  Comments)    Abnormal bleeding  . Other Other (See Comments)    Berries : Rash  . Sulfa Antibiotics Rash    Family History  Problem Relation Age of Onset  . Heart disease Mother   . Stroke Mother 63  . Cancer Mother        skin and breast  . Hypertension Mother   . Breast cancer Mother 53  . COPD Father   . Cancer Father   . Diabetes Father   . Hypertension Father   . Stroke Maternal Grandmother   . Mental illness Maternal Grandmother    . Breast cancer Sister   . Cancer Cousin 40    Prior to Admission medications   Medication Sig Start Date End Date Taking? Authorizing Provider  acetaminophen (TYLENOL) 500 MG tablet Take 500 mg by mouth every 6 (six) hours as needed for moderate pain. Take 1 to 2 tablets daily   Yes [provider]  benazepril-hydrochlorthiazide (LOTENSIN HCT) 20-25 MG tablet TAKE 1 TABLET BY MOUTH DAILY. 12/22/19  Yes Opalski, Deborah, DO  carvedilol (COREG) 6.25 MG tablet TAKE 1 TABLET BY MOUTH 2 TIMES DAILY WITH A MEAL. Patient taking differently: Take 6.25 mg by mouth 2 (two) times daily with a meal.  11/11/19  Yes Marykay Lex, MD  Cholecalciferol (VITAMIN D3) 125 MCG (5000 UT) TABS Take 15,000 Units by mouth See admin instructions. Takes 26712 units 5 days a week and 20000 units on 2 days.   Yes [provider]  Cyanocobalamin (VITAMIN B 12 PO) Take 2,500 mg by mouth once a week.   Yes [provider]  cyclobenzaprine (FLEXERIL) 10 MG tablet TAKE 1 TABLET BY MOUTH 3 TIMES DAILY AS NEEDED FOR MUSCLE SPASMS. Patient taking differently: Take 10 mg by mouth daily as needed for muscle spasms. . 03/03/20  Yes Opalski, Deborah, DO  diphenhydrAMINE (BENADRYL) 25 MG tablet Take 25 mg by mouth at bedtime as needed for sleep.    Yes [provider]  ferrous sulfate 325 (65 FE) MG tablet Take 325 mg by mouth daily with breakfast.   Yes [provider]  furosemide (LASIX) 40 MG tablet Take 1 tablet (40 mg total) by mouth daily. Patient taking differently: Take 20-40 mg by mouth See admin instructions. Takes 40mg  twice a week and 20 mg on other days , depends on weight and water intake. 05/19/20  Yes 05/21/20, MD  gabapentin (NEURONTIN) 300 MG capsule Take 300 mg by mouth See admin instructions. Take 5 capsules in the morning and 4 capsules at bedtime.    Yes [provider]  HYDROcodone-acetaminophen (NORCO) 10-325 MG tablet Take 0.5 tablets by mouth every 6  (six) hours as needed for moderate pain.  03/08/20  Yes [provider]  levothyroxine (SYNTHROID) 125 MCG tablet Only take 1 tab Tues and Fridays Patient taking differently: Take 125 mcg by mouth See admin instructions. Take 1 tablet by mouth on Tuesday and Fridays 05/07/20  Yes [name], [name], PA-C  levothyroxine (SYNTHROID) 150 MCG tablet everyday except Tues and Friday- take Thursday those days Patient taking differently: Take 150 mcg by mouth See admin instructions. Takes everyday except Tues and Friday 05/07/20  Yes 07/07/20, PA-C  Melatonin 5 MG CAPS Take 10 mg by mouth at bedtime.    Yes [provider]  meloxicam (MOBIC) 15 MG tablet TAKE 1 TABLET BY MOUTH DAILY. Patient taking differently: Take 15 mg by mouth daily.  12/15/19  Yes 12/17/19  J, MD  pantoprazole (PROTONIX) 40 MG tablet Take 1 tablet (40 mg total) by mouth daily. 05/06/20  Yes [name], [name], PA-C  rosuvastatin (CRESTOR) 5 MG tablet Take 1 tablet (5 mg total) by mouth daily. 04/13/20  Yes [name], [name], PA-C  traMADol (ULTRAM) 50 MG tablet TAKE 1 TABLET BY MOUTH 2 TIMES DAILY. MAXIMUM 6 TABLETS PER DAY AS DIRECTED BY PRESCRIBER Patient taking differently: Take 50 mg by mouth daily as needed for moderate pain.  05/24/20  Yes Monica Bectonhekkekandam, Thomas J, MD  cyanocobalamin (,VITAMIN B-12,) 1000 MCG/ML injection Inject 1 mL (1,000 mcg total) into the muscle every 21 ( twenty-one) days. Patient not taking: Reported on 05/24/2020 05/08/19   Thomasene Lotpalski, Deborah, DO  gabapentin (NEURONTIN) 300 MG capsule Take 4 capsules by mouth every morning, 3 capsules in the afternoon, and 4 capsules at night Patient not taking: Reported on 05/24/2020 05/07/20   [name], [name], PA-C    Physical Exam: Vitals:   05/24/20 1544 05/24/20 1921 05/24/20 2201  BP: (!) 145/71 (!) 157/85 (!) 163/90  Pulse: 72 78   Resp: 18 18 (!) 24  Temp: 98.2 F (36.8 C)    TempSrc: Oral    SpO2: 97% 96%   Weight: 102.1 kg    Height: 5'  5.5" (1.664 m)      Physical Exam Constitutional:      General: She is not in acute distress. HENT:     Head: Normocephalic.     Mouth/Throat:     Mouth: Mucous membranes are moist.  Eyes:     Extraocular Movements: Extraocular movements intact.     Pupils: Pupils are equal, round, and reactive to light.  Cardiovascular:     Rate and Rhythm: Normal rate and regular rhythm.     Pulses: Normal pulses.  Pulmonary:     Effort: Pulmonary effort is normal. No respiratory distress.     Breath sounds: Normal breath sounds. No wheezing or rales.  Abdominal:     General: Abdomen is flat. Bowel sounds are normal. There is no distension.     Palpations: Abdomen is soft.     Tenderness: There is no abdominal tenderness. There is no guarding.  Musculoskeletal:     Cervical back: Normal range of motion and neck supple.     Right lower leg: Edema present.     Left lower leg: Edema present.     Comments: +1 pitting edema bilateral lower extremities  Skin:    General: Skin is warm and dry.     Comments: Chronic venous stasis dermatitis of bilateral lower extremities  Neurological:     Mental Status: She is alert and oriented to person, place, and time.     Cranial Nerves: No cranial nerve deficit.     Motor: Weakness present.     Comments: Left lower extremity: Patient is able to flex her thigh but unable to lift her left lower extremity against gravity.  Strength 1 out of 5 upon dorsiflexion and plantarflexion of the foot.  Sensation to light touch diminished from the knee down to the foot. Right lower extremity strength and sensation to light touch intact. Strength and sensation to light touch intact in bilateral upper extremities.     Labs on Admission: I have personally reviewed following labs and imaging studies  CBC: Recent Labs  Lab 05/24/20 1608  WBC 5.2  NEUTROABS 2.5  HGB 13.3  HCT 40.9  MCV 109.1*  PLT 226   Basic Metabolic Panel: Recent Labs  Lab  05/24/20 1608  NA  133*  K 4.2  CL 98  CO2 26  GLUCOSE 103*  BUN 14  CREATININE 0.94  CALCIUM 9.0   GFR: Estimated Creatinine Clearance: 63.8 mL/min (by C-G formula based on SCr of 0.94 mg/dL). Liver Function Tests: Recent Labs  Lab 05/24/20 1608  AST 26  ALT 19  ALKPHOS 80  BILITOT 0.7  PROT 6.3*  ALBUMIN 3.4*   No results for input(s): LIPASE, AMYLASE in the last 168 hours. No results for input(s): AMMONIA in the last 168 hours. Coagulation Profile: Recent Labs  Lab 05/24/20 1608  [name] 1.1   Cardiac Enzymes: No results for input(s): [name], [name], CKMBINDEX, TROPONINI in the last 168 hours. BNP (last 3 results) No results for input(s): PROBNP in the last 8760 hours. HbA1C: No results for input(s): HGBA1C in the last 72 hours. CBG: No results for input(s): GLUCAP in the last 168 hours. Lipid Profile: No results for input(s): CHOL, HDL, LDLCALC, TRIG, CHOLHDL, LDLDIRECT in the last 72 hours. Thyroid Function Tests: No results for input(s): TSH, T4TOTAL, FREET4, T3FREE, THYROIDAB in the last 72 hours. Anemia Panel: No results for input(s): VITAMINB12, FOLATE, FERRITIN, TIBC, IRON, RETICCTPCT in the last 72 hours. Urine analysis:    Component Value Date/Time   COLORURINE YELLOW 01/15/2013 1512   APPEARANCEUR CLOUDY (A) 01/15/2013 1512   LABSPEC 1.023 01/15/2013 1512   PHURINE 5.0 01/15/2013 1512   GLUCOSEU NEGATIVE 01/15/2013 1512   HGBUR MODERATE (A) 01/15/2013 1512   BILIRUBINUR negative 10/04/2015 1237   BILIRUBINUR small 04/25/2015 0937   KETONESUR negative 10/04/2015 1237   KETONESUR NEGATIVE 01/15/2013 1512   PROTEINUR negative 10/04/2015 1237   PROTEINUR neg 04/25/2015 0937   PROTEINUR NEGATIVE 01/15/2013 1512   UROBILINOGEN 0.2 10/04/2015 1237   UROBILINOGEN 0.2 01/15/2013 1512   NITRITE Negative 10/04/2015 1237   NITRITE neg 04/25/2015 0937   NITRITE NEGATIVE 01/15/2013 1512   LEUKOCYTESUR Negative 10/04/2015 1237    Radiological Exams on Admission: CT HEAD WO  CONTRAST  Result Date: 05/24/2020 CLINICAL DATA:  LEFT leg weakness since Friday EXAM: CT HEAD WITHOUT CONTRAST TECHNIQUE: Contiguous axial images were obtained from the base of the skull through the vertex without intravenous contrast. COMPARISON:  December 04, 2018 FINDINGS: Brain: No evidence of acute infarction, hemorrhage, hydrocephalus, extra-axial collection or mass lesion/mass effect. Vascular: No hyperdense vessel or unexpected calcification. Skull: Normal. Negative for fracture or focal lesion. Sinuses/Orbits: Chronic opacification of the RIGHT maxillary sinus with similar appearance to the previous imaging study. Orbits are unremarkable. Other: None. IMPRESSION: 1. No acute intracranial abnormality. 2. Chronic RIGHT maxillary sinus disease. 3. Signs of trauma to the RIGHT nasal bone with similar appearance in terms of deformity. Electronically Signed   By: Donzetta Kohut M.D.   On: 05/24/2020 17:45    EKG: Independently reviewed.  Sinus rhythm, no acute changes.  Assessment/Plan Principal Problem:   Left leg weakness Active Problems:   Hypertension   Hypothyroidism   HLD (hyperlipidemia)   Alcohol use   Left lower extremity weakness/numbness: Weakness and numbness appear to be from the knee down to the foot.  No fever or complaints of low back pain.  No saddle anesthesia or loss of bowel/bladder function.  Head CT negative for acute stroke.  Discussed with neurology-recommended obtaining brain MRI to rule out stroke given risk factors.  If MRI negative, patient will need EMG nerve conduction studies in 4 to 6 weeks. -Brain MRI without contrast.  PT and OT evaluation.  Alcohol  use disorder: Patient reports drinking a bottle of wine daily.  Currently not displaying any signs of withdrawal but is at high risk for withdrawal during this hospitalization. -CIWA protocol; Ativan as needed.  Thiamine and folate.  Hypertension: Systolic currently in the 150s to 160s. -Allow permissive  hypertension until brain MRI rules out acute stroke.  Macrocytosis without anemia: Hemoglobin 13.3, MCV 109.  Likely related to chronic alcohol abuse. -Check B12 and folate levels  Hyperlipidemia -Continue Crestor  Hypothyroidism -Continue Synthroid  GERD -Continue PPI  Chronic diastolic CHF: Lungs clear on exam and not hypoxic.  Has mild peripheral edema which is likely due to venous stasis. -Hold diuretic at this time/allow permissive hypertension until brain MRI rules out acute stroke.  DVT prophylaxis: Lovenox Code Status: Full code-discussed with the patient Family Communication: Husband at bedside. Disposition Plan: It is my clinical opinion that referral for OBSERVATION is reasonable and necessary in this patient based on the above information provided. The aforementioned taken together are felt to place the patient at high risk for further clinical deterioration. However it is anticipated that the patient may be medically stable for discharge from the hospital within 24 to 48 hours.    Consults called: Neurology-Dr. Wilford Corner  The medical decision making on this patient was of high complexity and the patient is at high risk for clinical deterioration, therefore this is a level 3 visit.  John Giovanni MD [facility]  If 7PM-7AM, please contact night-coverage [email or web address]  05/25/2020, 12:02 AM

## 2020-05-25 ENCOUNTER — Observation Stay (HOSPITAL_COMMUNITY): Payer: Medicare PPO

## 2020-05-25 DIAGNOSIS — M5410 Radiculopathy, site unspecified: Secondary | ICD-10-CM | POA: Diagnosis present

## 2020-05-25 DIAGNOSIS — R29898 Other symptoms and signs involving the musculoskeletal system: Secondary | ICD-10-CM | POA: Diagnosis not present

## 2020-05-25 DIAGNOSIS — L8990 Pressure ulcer of unspecified site, unspecified stage: Secondary | ICD-10-CM | POA: Insufficient documentation

## 2020-05-25 DIAGNOSIS — Z7289 Other problems related to lifestyle: Secondary | ICD-10-CM

## 2020-05-25 DIAGNOSIS — Z789 Other specified health status: Secondary | ICD-10-CM

## 2020-05-25 DIAGNOSIS — R531 Weakness: Secondary | ICD-10-CM | POA: Diagnosis not present

## 2020-05-25 LAB — URINALYSIS, ROUTINE W REFLEX MICROSCOPIC
Bilirubin Urine: NEGATIVE
Glucose, UA: NEGATIVE mg/dL
Ketones, ur: NEGATIVE mg/dL
Leukocytes,Ua: NEGATIVE
Nitrite: NEGATIVE
Protein, ur: NEGATIVE mg/dL
Specific Gravity, Urine: 1.005 (ref 1.005–1.030)
pH: 6 (ref 5.0–8.0)

## 2020-05-25 LAB — FOLATE: Folate: 22.6 ng/mL (ref >5.9)

## 2020-05-25 LAB — SARS CORONAVIRUS 2 BY RT PCR (HOSPITAL ORDER, PERFORMED IN ~~LOC~~ HOSPITAL LAB): SARS Coronavirus 2: NEGATIVE

## 2020-05-25 LAB — MAGNESIUM: Magnesium: 2.4 mg/dL (ref 1.7–2.4)

## 2020-05-25 LAB — PHOSPHORUS: Phosphorus: 3.8 mg/dL (ref 2.5–4.6)

## 2020-05-25 LAB — VITAMIN B12: Vitamin B-12: 1312 pg/mL — ABNORMAL HIGH (ref 180–914)

## 2020-05-25 IMAGING — MR MR HEAD W/O CM
10 of 11 series · 42 of 48 positions shown · non-contrast
Comparison: Prior head CT from [DATE].

CLINICAL DATA: Initial evaluation for acute left lower extremity
weakness.

EXAM:
MRI HEAD WITHOUT CONTRAST
TECHNIQUE: Multiplanar, multiecho pulse sequences of the brain and surrounding
structures were obtained without intravenous contrast.

[Series 5: DWI · axial · 3.0mm · 0.88mm/px · z∈[-82,+56]mm · 8 of 96 slices shown (1 of 4)]
[im 1/96]
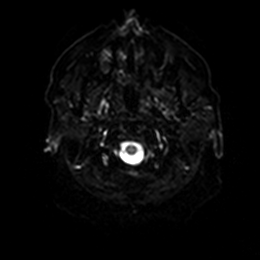
[im 14/96]
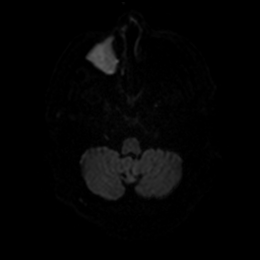
[im 28/96]
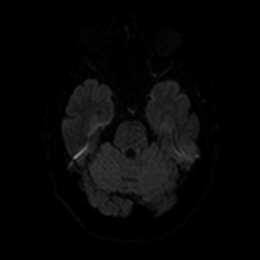
[im 41/96]
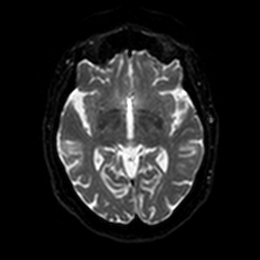
[im 55/96]
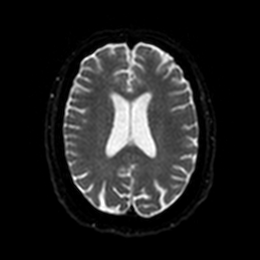
[im 68/96]
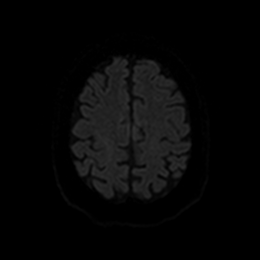
[im 82/96]
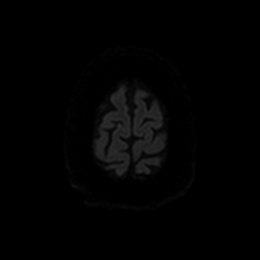
[im 96/96]
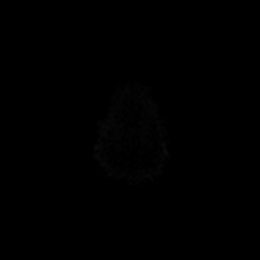

[Series 6: DWI · axial · 3.0mm · 0.88mm/px · z∈[-82,+56]mm · 4 of 48 slices shown (2 of 4)]
[im 1/48]
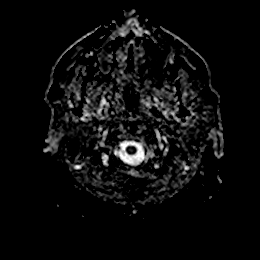
[im 16/48]
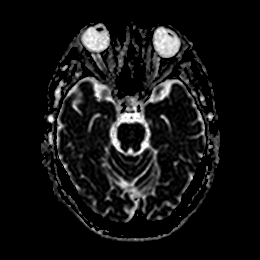
[im 32/48]
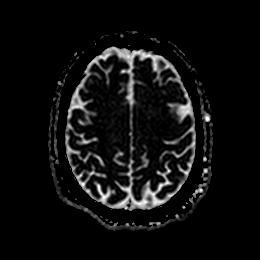
[im 48/48]
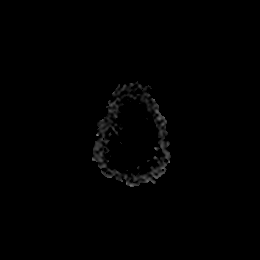

[Series 7: DWI · coronal · 4.0mm · 0.88mm/px · 6 of 64 slices shown (3 of 4)]
[im 1/64]
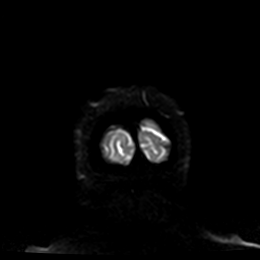
[im 13/64]
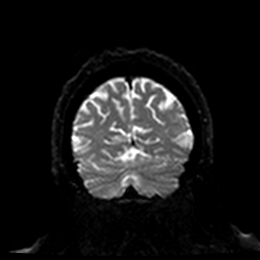
[im 26/64]
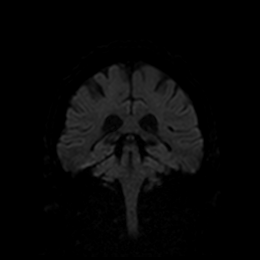
[im 38/64]
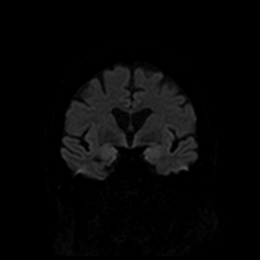
[im 51/64]
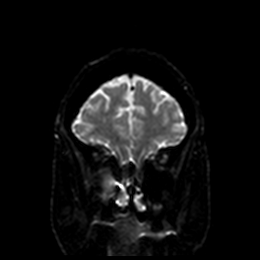
[im 64/64]
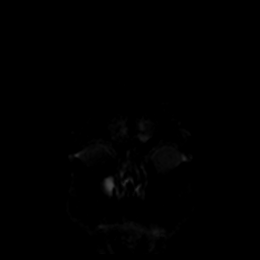

[Series 8: DWI · coronal · 4.0mm · 0.88mm/px · 3 of 32 slices shown (4 of 4)]
[im 1/32]
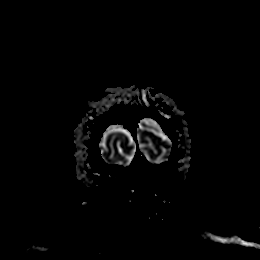
[im 16/32]
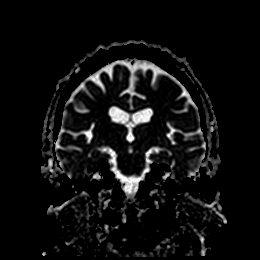
[im 32/32]
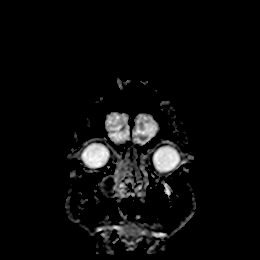

[Series 9: T1 · sagittal · 5.0mm · 0.75mm/px · 2 of 23 slices shown]
[im 1/23]
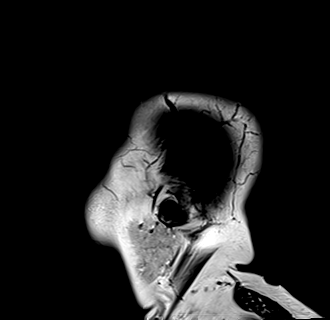
[im 23/23]
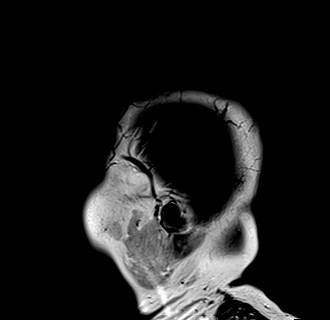

[Series 10: T2 · axial · 5.0mm · 0.72mm/px · z∈[-83,+58]mm · 2 of 25 slices shown (1 of 2)]
[im 1/25]
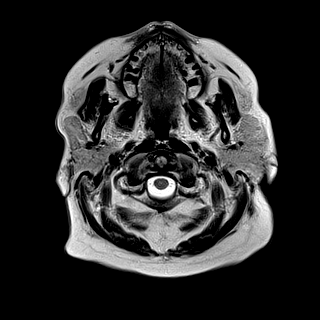
[im 25/25]
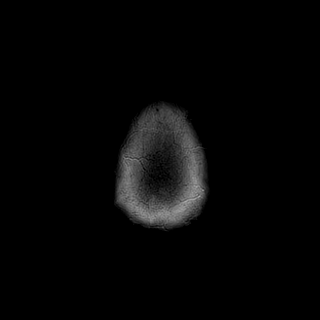

[Series 11: FLAIR · axial · 5.0mm · 0.45mm/px · z∈[-84,+57]mm · 2 of 25 slices shown]
[im 1/25]
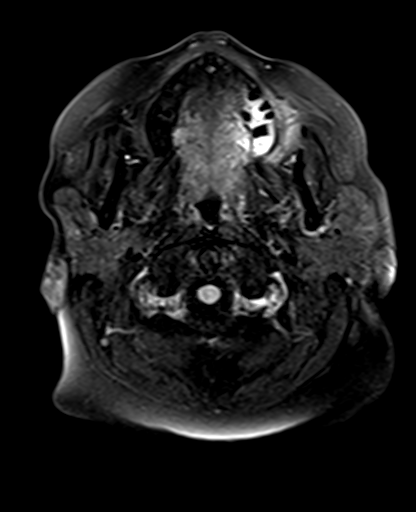
[im 25/25]
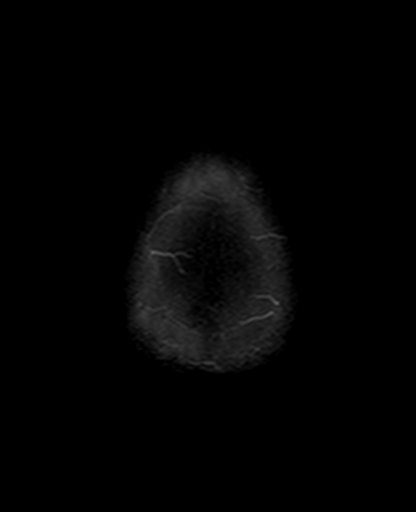

[Series 13: pha_images · axial · 3.0mm · 0.90mm/px · z∈[-100,+73]mm · 6 of 60 slices shown]
[im 1/60]
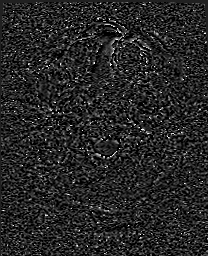
[im 12/60]
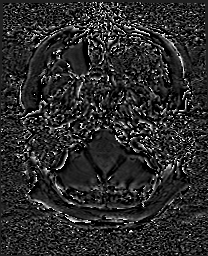
[im 24/60]
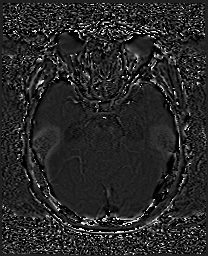
[im 36/60]
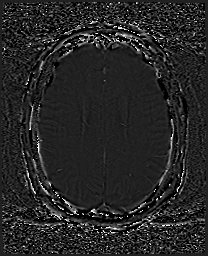
[im 48/60]
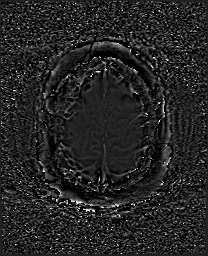
[im 60/60]
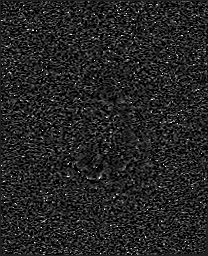

[Series 14: swi_images · axial · 3.0mm · 0.90mm/px · z∈[-100,+73]mm · 6 of 60 slices shown]
[im 1/60]
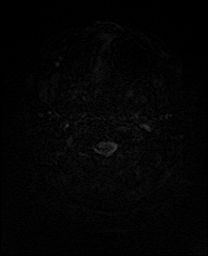
[im 12/60]
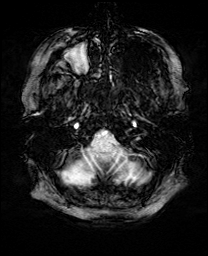
[im 24/60]
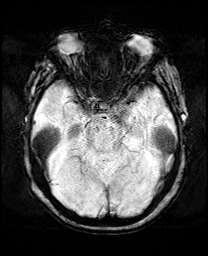
[im 36/60]
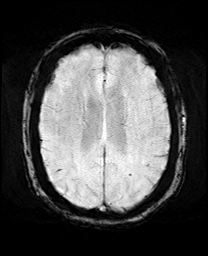
[im 48/60]
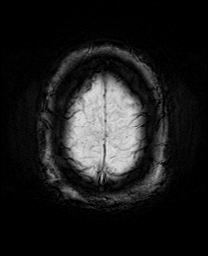
[im 60/60]
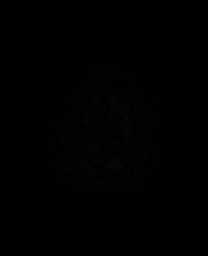

[Series 17: T2 · coronal · 5.0mm · 0.34mm/px · 3 of 29 slices shown (2 of 2)]
[im 1/29]
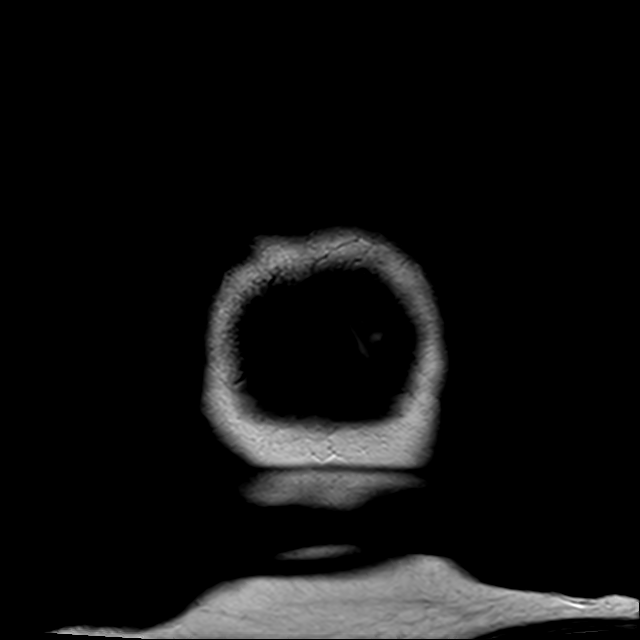
[im 15/29]
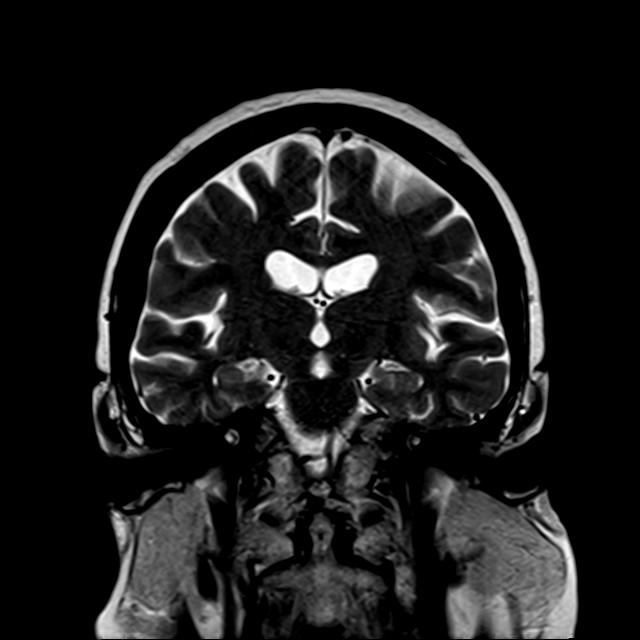
[im 29/29]
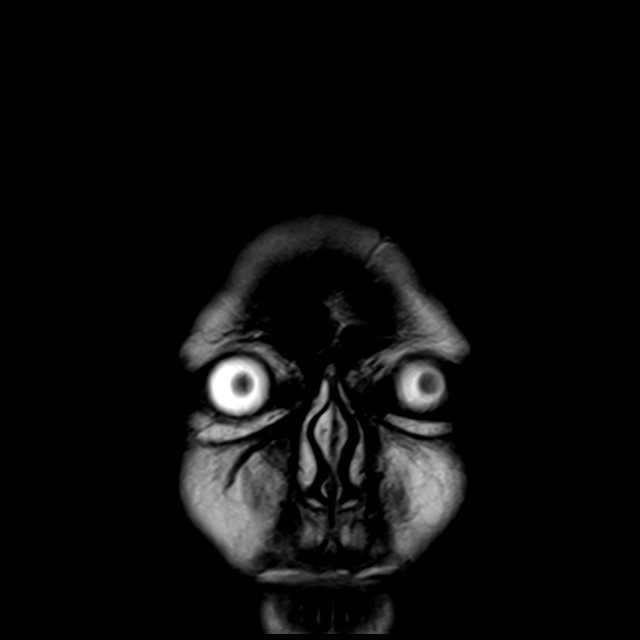

[42 of 48 positions shown; findings below may reference images not displayed]

FINDINGS: Brain: Generalized age-related cerebral atrophy. Minimal chronic
microvascular ischemic changes for age. No abnormal foci of
restricted diffusion to suggest acute or subacute ischemia.
Gray-white matter differentiation maintained. No encephalomalacia to
suggest chronic cortical infarction. No evidence for acute
intracranial hemorrhage. Single punctate focus of susceptibility
artifact noted at the left parietal region, consistent with a small
chronic microhemorrhage, of doubtful significance in isolation.

No mass lesion, midline shift or mass effect. No hydrocephalus or
extra-axial fluid collection. Pituitary gland suprasellar region
normal. Midline structures intact and normal.

Vascular: Major intracranial vascular flow voids are maintained.

Skull and upper cervical spine: Craniocervical junction within
normal limits. Bone marrow signal intensity normal.
Well-circumscribed subcentimeter T1 hyperintense lesion noted at the
right parietal calvarium, nonspecific, but most likely benign and of
doubtful significance. No scalp soft tissue abnormality.

Sinuses/Orbits: Globes and orbital soft tissues within normal
limits. Chronic right maxillary sinusitis noted. Paranasal sinuses
are otherwise largely clear. No mastoid effusion. Inner ear
structures grossly normal.

Other: None.
IMPRESSION: 1. Negative brain MRI for age, with no acute intracranial
abnormality identified.
2. Chronic right maxillary sinusitis.

## 2020-05-25 MED ORDER — TRAMADOL HCL 50 MG PO TABS
50.0000 mg | ORAL_TABLET | Freq: Two times a day (BID) | ORAL | Status: AC | PRN
  Administered 2020-05-25: 50 mg via ORAL
  Filled 2020-05-25: qty 1

## 2020-05-25 NOTE — Evaluation (Signed)
Occupational Therapy Evaluation Patient Details Name: [name] MRN: [id number] DOB: Jul 29, 1946 Today's Date: 05/25/2020    History of Present Illness 74 yo female admitted with L leg weakness and numb from knee down.  MRI negative PMH HTN HLD CHF venous statis alcohol use disorder   Clinical Impression   PT admitted with BIL LE weakness noted this session. Pt currently with functional limitiations due to the deficits listed below (see OT problem list). Pt with sudden on set of L shoulder pain that requires a "shoulder flexion/ abduction - hand on occipital area of skull isometric hold" to try to relieve the pain. Pt reports when I push against my hand and my hand against my head it use to give me more relief but its starting not to work. Pt with this pain only starting after static standing. Pt with L LE buckling require total blocking during static standing. Pt bracing against bed for support. Pt very limited standing and this patient was walking without device in the house just a day ago.  Pt will benefit from skilled OT to increase their independence and safety with adls and balance to allow discharge CIR.      Follow Up Recommendations  CIR    Equipment Recommendations  3 in 1 bedside commode;Wheelchair (measurements OT);Wheelchair cushion (measurements OT)    Recommendations for Other Services Rehab consult     Precautions / Restrictions Precautions Precautions: Fall Precaution Comments: reports 1 fall in last 6 months getting out of car at dentist office and reports "i was just spinning even though i wasn't"  pt reports more concern due to increased instability with basic transfers recently      Mobility Bed Mobility Overal bed mobility: Needs Assistance Bed Mobility: Supine to Sit;Sit to Supine     Supine to sit: +2 for physical assistance;Max assist Sit to supine: +2 for physical assistance;Max assist   General bed mobility comments: pt requires (A) to  elevate trunk from bed surface. pt able to progress R LE off bed but requries (A) from L LE> pt requires increased (A) to return to supine with max cues  Transfers Overall transfer level: Needs assistance Equipment used: 2 person hand held assist Transfers: Sit to/from Stand Sit to Stand: +2 physical assistance;Max assist         General transfer comment: pt requires blocking L LE and reports feeling R LE starting to give way during session. pt bracing against bed surface. pt reports pain in L shoulder only after standing. pt standing with pressure down through elbows onto therapist causing scapula depression to sustain static posture.     Balance Overall balance assessment: Needs assistance Sitting-balance support: Bilateral upper extremity supported;Feet supported Sitting balance-Leahy Scale: Fair     Standing balance support: Bilateral upper extremity supported;During functional activity Standing balance-Leahy Scale: Zero Standing balance comment: requires L LE blocking                           ADL either performed or assessed with clinical judgement   ADL Overall ADL's : Needs assistance/impaired Eating/Feeding: Modified independent;Bed level   Grooming: Wash/dry face;Bed level;Modified independent   Upper Body Bathing: Set up;Bed level   Lower Body Bathing: Maximal assistance;Sit to/from stand;+2 for physical assistance           Toilet Transfer: +2 for physical assistance;Maximal assistance Toilet Transfer Details (indicate cue type and reason): sit<> stand only simulated from EOB  General ADL Comments: pt with onset of pain in L ue      Vision Baseline Vision/History: Wears glasses Wears Glasses: At all times       Perception     Praxis      Pertinent Vitals/Pain Pain Assessment: Faces Faces Pain Scale: Hurts whole lot Pain Location: L shoulder area Pain Descriptors / Indicators: Discomfort;Grimacing;Guarding;Sharp Pain  Intervention(s): Monitored during session;Repositioned     Hand Dominance Right   Extremity/Trunk Assessment Upper Extremity Assessment Upper Extremity Assessment: LUE deficits/detail LUE Deficits / Details: pt reports a sudden sharp pain that she can only get relief from by shoulder flexion, abdution with hand behind head pushing intoward on her head and head on her hand. pt reports "this is starting to not give me the relief now" pt reports sometimes on R but always on L. This occurred only after static standing LUE Coordination: WNL   Lower Extremity Assessment Lower Extremity Assessment: Defer to PT evaluation;RLE deficits/detail;LLE deficits/detail RLE Deficits / Details: noted to have redness below knee and edema present. pt reports that redness is not new but the swelling is. pt reports sensation change is new LLE Deficits / Details: redness to skin below knee with edema present. pt reports decr sensation. pt reports that L feels different than R. pt states "your glove feels smooth on my right leg but it feels rough to touch on my left left"  LLE Sensation: decreased light touch   Cervical / Trunk Assessment Cervical / Trunk Assessment: Kyphotic;Other exceptions Cervical / Trunk Exceptions: pt with neck head forward flexion with rotated shoulders. when pt fully extended to stand with sudden L arm pain starting   Communication Communication Communication: No difficulties   Cognition Arousal/Alertness: Awake/alert Behavior During Therapy: WFL for tasks assessed/performed Overall Cognitive Status: Within Functional Limits for tasks assessed                                     General Comments       Exercises     Shoulder Instructions      Home Living Family/patient expects to be discharged to:: Private residence Living Arrangements: Spouse/significant other Available Help at Discharge: Family Type of Home: House Home Access: Stairs to enter Water quality scientist of Steps: 2 Entrance Stairs-Rails: Right Home Layout: Two level;1/2 bath on main level;Bed/bath upstairs (3800 sq ft home)     Bathroom Shower/Tub: Producer, television/film/video: Standard     Home Equipment: Environmental consultant - 2 wheels;Cane - single point   Additional Comments: reports that she has been sleeping on the main level due to increased difficulty with the stairs. the couch can turn into a bed       Prior Functioning/Environment Level of Independence: Needs assistance  Gait / Transfers Assistance Needed: walks with cane outsid the home ADL's / Homemaking Assistance Needed: reports spouse (A) with stairs to go upstairs for bathing. "i do not bath unless he is there to help me"             OT Problem List: Decreased strength;Decreased activity tolerance;Impaired balance (sitting and/or standing);Decreased coordination;Decreased safety awareness;Decreased knowledge of use of DME or AE;Decreased knowledge of precautions;Impaired sensation;Obesity;Impaired UE functional use;Pain;Other (comment)      OT Treatment/Interventions: Self-care/ADL training;Therapeutic exercise;Neuromuscular education;DME and/or AE instruction;Manual therapy;Modalities;Therapeutic activities;Patient/family education;Balance training    OT Goals(Current goals can be found in the care plan section) Acute Rehab OT  Goals Patient Stated Goal: to be able to sit up for meals OT Goal Formulation: With patient Time For Goal Achievement: 06/08/20 Potential to Achieve Goals: Good  OT Frequency: Min 2X/week   Barriers to D/C:            Co-evaluation PT/OT/SLP Co-Evaluation/Treatment: Yes Reason for Co-Treatment: For patient/therapist safety;To address functional/ADL transfers   OT goals addressed during session: ADL's and self-care;Proper use of Adaptive equipment and DME;Strengthening/ROM      AM-PAC OT "6 Clicks" Daily Activity     Outcome Measure Help from another person eating meals?:  None Help from another person taking care of personal grooming?: A Little Help from another person toileting, which includes using toliet, bedpan, or urinal?: A Lot Help from another person bathing (including washing, rinsing, drying)?: A Lot Help from another person to put on and taking off regular upper body clothing?: A Lot Help from another person to put on and taking off regular lower body clothing?: A Lot 6 Click Score: 15   End of Session Equipment Utilized During Treatment: Gait belt Nurse Communication: Mobility status;Precautions  Activity Tolerance: Patient limited by pain Patient left: in bed;Other (comment) (hall way patient)  OT Visit Diagnosis: Unsteadiness on feet (R26.81);Muscle weakness (generalized) (M62.81);Repeated falls (R29.6);Pain Pain - Right/Left: Left Pain - part of body: Shoulder                Time: 2831-5176 OT Time Calculation (min): 16 min Charges:  OT General Charges $OT Visit: 1 Visit OT Evaluation $OT Eval Moderate Complexity: 1 Mod   Brynn, OTR/L  Acute Rehabilitation Services Pager: [phone] Office: [phone] .   Mateo Flow 05/25/2020, 2:34 PM

## 2020-05-25 NOTE — Progress Notes (Signed)
05/25/20 1652  PT Visit Information  Last PT Received On 05/25/20  Assistance Needed +2  PT/OT/SLP Co-Evaluation/Treatment Yes  Reason for Co-Treatment For patient/therapist safety;To address functional/ADL transfers  PT goals addressed during session Mobility/safety with mobility  History of Present Illness 74 yo female admitted with L leg weakness and numb from knee down.  MRI of head negative. PMH HTN HLD CHF venous statis alcohol use disorder  Precautions  Precautions Fall  Precaution Comments reports 1 fall in last 6 months getting out of car at dentist office and reports "i was just spinning even though i wasn't"  pt reports more concern due to increased instability with basic transfers recently  Restrictions  Weight Bearing Restrictions No  Home Living  Family/patient expects to be discharged to: Private residence  Living Arrangements Spouse/significant other  Available Help at Discharge Family  Type of Home House  Home Access Stairs to enter  Entrance Stairs-Number of Steps 2  Entrance Stairs-Rails Right  Home Layout Two level;1/2 bath on main level;Bed/bath upstairs (3800 sq ft home)  Health and safety inspector - 2 wheels;Cane - single point  Additional Comments reports that she has been sleeping on the main level due to increased difficulty with the stairs. the couch can turn into a bed   Prior Function  Level of Independence Needs assistance  Gait / Transfers Assistance Needed walks with cane outside the home  ADL's / Homemaking Assistance Needed reports spouse (A) with stairs to go upstairs for bathing. "i do not bathe unless he is there to help me"   Communication  Communication No difficulties  Pain Assessment  Pain Assessment Faces  Faces Pain Scale 8  Pain Location L shoulder area  Pain Descriptors / Indicators Discomfort;Grimacing;Guarding;Sharp  Pain Intervention(s) Limited activity within patient's  tolerance;Monitored during session;Repositioned  Cognition  Arousal/Alertness Awake/alert  Behavior During Therapy WFL for tasks assessed/performed  Overall Cognitive Status Within Functional Limits for tasks assessed  Upper Extremity Assessment  Upper Extremity Assessment Defer to OT evaluation  LUE Deficits / Details pt reports a sudden sharp pain that she can only get relief from by shoulder flexion, abdution with hand behind head pushing intoward on her head and head on her hand. pt reports "this is starting to not give me the relief now" pt reports sometimes on R but always on L. This occurred only after static standing  LUE Coordination WNL  Lower Extremity Assessment  Lower Extremity Assessment LLE deficits/detail;RLE deficits/detail  RLE Deficits / Details noted to have redness below knee and edema present. pt reports that redness is not new but the swelling is. pt reports sensation change is new  LLE Deficits / Details redness to skin below knee with edema present. pt reports decr sensation. pt reports that L feels different than R. pt states "your glove feels smooth on my right leg but it feels rough to touch on my left left" Noted functional weakness as increased L knee buckling when standing.   LLE Sensation decreased light touch  Cervical / Trunk Assessment  Cervical / Trunk Assessment Kyphotic;Other exceptions  Cervical / Trunk Exceptions pt with neck head forward flexion with rotated shoulders. when pt fully extended to stand with sudden L arm pain starting  Bed Mobility  Overal bed mobility Needs Assistance  Bed Mobility Supine to Sit;Sit to Supine  Supine to sit +2 for physical assistance;Max assist  Sit to supine +2 for physical assistance;Max assist  General  bed mobility comments pt requires (A) to elevate trunk from bed surface. pt able to progress R LE off bed but requries (A) from L LE> pt requires increased (A) to return to supine with max cues  Transfers  Overall  transfer level Needs assistance  Equipment used 2 person hand held assist  Transfers Sit to/from Stand  Sit to Stand +2 physical assistance;Max assist  General transfer comment pt requires blocking L LE and reports feeling R LE starting to give way during session. pt bracing against bed surface. pt reports pain in L shoulder only after standing.  Balance  Overall balance assessment Needs assistance  Sitting-balance support Bilateral upper extremity supported;Feet supported  Sitting balance-Leahy Scale Fair  Standing balance support Bilateral upper extremity supported;During functional activity  Standing balance-Leahy Scale Zero  Standing balance comment requires L LE blocking  PT - End of Session  Equipment Utilized During Treatment Gait belt  Activity Tolerance Patient tolerated treatment well  Patient left in bed;with call bell/phone within reach (on stretcher in ED. )  Nurse Communication Mobility status  PT Assessment  PT Recommendation/Assessment Patient needs continued PT services  PT Visit Diagnosis Unsteadiness on feet (R26.81);Muscle weakness (generalized) (M62.81);Difficulty in walking, not elsewhere classified (R26.2)  PT Problem List Decreased strength;Decreased balance;Decreased mobility;Decreased activity tolerance;Decreased range of motion;Impaired sensation;Decreased coordination  PT Plan  PT Frequency (ACUTE ONLY) Min 3X/week  PT Treatment/Interventions (ACUTE ONLY) Gait training;DME instruction;Functional mobility training;Therapeutic activities;Therapeutic exercise;Balance training;Patient/family education;Neuromuscular re-education  AM-PAC PT "6 Clicks" Mobility Outcome Measure (Version 2)  Help needed turning from your back to your side while in a flat bed without using bedrails? 1  Help needed moving from lying on your back to sitting on the side of a flat bed without using bedrails? 1  Help needed moving to and from a bed to a chair (including a wheelchair)? 1   Help needed standing up from a chair using your arms (e.g., wheelchair or bedside chair)? 1  Help needed to walk in hospital room? 1  Help needed climbing 3-5 steps with a railing?  1  6 Click Score 6  Consider Recommendation of Discharge To: CIR/SNF/LTACH  PT Recommendation  Follow Up Recommendations CIR  PT equipment Wheelchair cushion (measurements PT);Wheelchair (measurements PT)  Individuals Consulted  Consulted and Agree with Results and Recommendations Patient  Acute Rehab PT Goals  Patient Stated Goal to be able to sit up for meals  PT Goal Formulation With patient  Time For Goal Achievement 06/08/20  Potential to Achieve Goals Good  PT Time Calculation  PT Start Time (ACUTE ONLY) 1241  PT Stop Time (ACUTE ONLY) 1257  PT Time Calculation (min) (ACUTE ONLY) 16 min  PT General Charges  $$ ACUTE PT VISIT 1 Visit  PT Evaluation  $PT Eval Moderate Complexity 1 Mod  Written Expression  Dominant Hand Right   Pt admitted secondary to problem above with deficits above. Pt seen with OT in ED as need for 2 skilled hands given deficits. Pt with BLE weakness (LLE>RLE) and decreased sensation in LLE. Pt requiring max A +2 for bed mobility and to stand at edge of stretcher. Pt with L knee buckling and requiring manual blocking. With cervical extension and upright posture, pt yelling out and complaining of sharp pain in L arm and pressing on back of head and neck. Feel pt would benefit from CIR level therapies as she was previously mod I with use of cane. Will continue to follow acutely to maximize functional mobility independence  and safety.   Farley Ly, PT, DPT  Acute Rehabilitation Services  Pager: [phone] Office: [phone]

## 2020-05-25 NOTE — Progress Notes (Signed)
PROGRESS NOTE    [name]  GNF:[id number] DOB: 12/12/1945 DOA: 05/24/2020 PCP: [name], [name], PA-C  Outpatient Specialists:   Brief Narrative:  Patient is a 74 year old Caucasian female, retired Engineer, civil (consulting)nurse, with past medical history significant for hypertension, hyperlipidemia, hypothyroidism, chronic diastolic CHF, venostasis, alcohol use disorder and reported GI bleed for which EGD was said to have revealed bleeding ulcer that was "clipped ".  Patient is worried if the clip is MRI compatible.  Patient's history is quite suggestible.  Patient presented with left lower extremity pain that sounded close to radiculopathy.  Patient also reported what appeared to be radiculopathic pain from the neck area, radiating to the left upper extremity.  MRI of the brain is nonrevealing.  We will have a low threshold to proceed with several: Lumbosacral MRI if no contraindications.  Input from physical therapy and Occupational Therapy is appreciated.  CIR has been recommended.  Assessment & Plan:   Principal Problem:   Left leg weakness Active Problems:   Hypertension   Hypothyroidism   HLD (hyperlipidemia)   Alcohol use   Pressure injury of skin  Left lower extremity weakness/numbness/neck pain radiating to the left upper extremity: -As per patient's history, weakness and numbness appear to be from the knee down to the foot.   -No fever or complaints of low back pain.  No saddle anesthesia or loss of bowel/bladder function.   -Head CT negative for acute stroke.   -MRI brain is nonrevealing.   -Neurology recommendation is appreciated: If MRI head is negative for stroke, patient will need EMG nerve conduction studies in 4 to 6 weeks. -Patient's history is suggestive below. -Patient also reports neck pain radiating to the left upper extremity. -Ideally, patient will benefit from MRI of the cervical, lumbosacral region.  Patient is worried that she had an EGD for bleeding gastric ulcer but 6 to  7 years ago and may have metallic clip!!  Alcohol use disorder:  -Patient reports drinking a bottle of wine daily.  Currently not displaying any signs of withdrawal but is at high risk for withdrawal during this hospitalization. -CIWA protocol; Ativan as needed.  Thiamine and folate.  Hypertension:  - controlled.    Macrocytosis without anemia:  Hemoglobin 13.3, MCV 109.  Likely related to chronic alcohol abuse. -B12 is 1312. Folate is 22.6.    Hyperlipidemia -Continue Crestor  Hypothyroidism -Continue Synthroid  GERD -Continue PPI  Chronic diastolic CHF:  -Compensated.    DVT prophylaxis: Subcutaneous Lovenox Code Status: Full code Family Communication:  Disposition Plan: CIR   Consultants:   Neurology  Procedures:   None  Antimicrobials:   None   Subjective: No new complaints  Objective: Vitals:   05/25/20 0745 05/25/20 0800 05/25/20 1500 05/25/20 1700  BP:  (!) 160/118 125/71 127/73  Pulse: 79  88 83  Resp:   16 16  Temp:   98.2 F (36.8 C) 98.2 F (36.8 C)  TempSrc:   Oral Oral  SpO2: 100%  97% 97%  Weight:      Height:       No intake or output data in the 24 hours ending 05/25/20 1945 Filed Weights   05/24/20 1544  Weight: 102.1 kg    Examination:  General exam: Appears calm and comfortable  Respiratory system: Clear to auscultation.  Cardiovascular system: S1 & S2 heard Gastrointestinal system: Abdomen is nondistended, soft and nontender. No organomegaly or masses felt. Normal bowel sounds heard. Central nervous system: Alert and oriented.  Patient moves all  extremities  Extremities: No leg edema  Data Reviewed: I have personally reviewed following labs and imaging studies  CBC: Recent Labs  Lab 05/24/20 1608  WBC 5.2  NEUTROABS 2.5  HGB 13.3  HCT 40.9  MCV 109.1*  PLT 226   Basic Metabolic Panel: Recent Labs  Lab 05/24/20 1608 05/25/20 0500  NA 133*  --   K 4.2  --   CL 98  --   CO2 26  --   GLUCOSE 103*   --   BUN 14  --   CREATININE 0.94  --   CALCIUM 9.0  --   MG  --  2.4  PHOS  --  3.8   GFR: Estimated Creatinine Clearance: 63.8 mL/min (by C-G formula based on SCr of 0.94 mg/dL). Liver Function Tests: Recent Labs  Lab 05/24/20 1608  AST 26  ALT 19  ALKPHOS 80  BILITOT 0.7  PROT 6.3*  ALBUMIN 3.4*   No results for input(s): LIPASE, AMYLASE in the last 168 hours. No results for input(s): AMMONIA in the last 168 hours. Coagulation Profile: Recent Labs  Lab 05/24/20 1608  [name] 1.1   Cardiac Enzymes: No results for input(s): [name], [name], CKMBINDEX, TROPONINI in the last 168 hours. BNP (last 3 results) No results for input(s): PROBNP in the last 8760 hours. HbA1C: No results for input(s): HGBA1C in the last 72 hours. CBG: No results for input(s): GLUCAP in the last 168 hours. Lipid Profile: No results for input(s): CHOL, HDL, LDLCALC, TRIG, CHOLHDL, LDLDIRECT in the last 72 hours. Thyroid Function Tests: No results for input(s): TSH, T4TOTAL, FREET4, T3FREE, THYROIDAB in the last 72 hours. Anemia Panel: Recent Labs    05/25/20 0500  VITAMINB12 1,312*  FOLATE 22.6   Urine analysis:    Component Value Date/Time   COLORURINE YELLOW 05/25/2020 0106   APPEARANCEUR CLEAR 05/25/2020 0106   LABSPEC 1.005 05/25/2020 0106   PHURINE 6.0 05/25/2020 0106   GLUCOSEU NEGATIVE 05/25/2020 0106   HGBUR SMALL (A) 05/25/2020 0106   BILIRUBINUR NEGATIVE 05/25/2020 0106   BILIRUBINUR negative 10/04/2015 1237   BILIRUBINUR small 04/25/2015 0937   KETONESUR NEGATIVE 05/25/2020 0106   PROTEINUR NEGATIVE 05/25/2020 0106   UROBILINOGEN 0.2 10/04/2015 1237   UROBILINOGEN 0.2 01/15/2013 1512   NITRITE NEGATIVE 05/25/2020 0106   LEUKOCYTESUR NEGATIVE 05/25/2020 0106   Sepsis Labs: @LABRCNTIP (procalcitonin:4,lacticidven:4)  ) Recent Results (from the past 240 hour(s))  SARS Coronavirus 2 by RT PCR (hospital order, performed in [facility] hospital lab) Nasopharyngeal     Status:  None   Collection Time: 05/25/20  1:00 AM   Specimen: Nasopharyngeal  Result Value Ref Range Status   SARS Coronavirus 2 NEGATIVE NEGATIVE Final    Comment: (NOTE) SARS-CoV-2 target nucleic acids are NOT DETECTED.  The SARS-CoV-2 RNA is generally detectable in upper and lower respiratory specimens during the acute phase of infection. The lowest concentration of SARS-CoV-2 viral copies this assay can detect is 250 copies / mL. A negative result does not preclude SARS-CoV-2 infection and should not be used as the sole basis for treatment or other patient management decisions.  A negative result may occur with improper specimen collection / handling, submission of specimen other than nasopharyngeal swab, presence of viral mutation(s) within the areas targeted by this assay, and inadequate number of viral copies (<250 copies / mL). A negative result must be combined with clinical observations, patient history, and epidemiological information.  Fact Sheet for Patients:   05/27/20  Fact Sheet for Healthcare  Providers: https://pope.com/  This test is not yet approved or  cleared by the [place] and has been authorized for detection and/or diagnosis of SARS-CoV-2 by FDA under an Emergency Use Authorization (EUA).  This EUA will remain in effect (meaning this test can be used) for the duration of the COVID-19 declaration under Section 564(b)(1) of the Act, 21 U.S.C. section 360bbb-3(b)(1), unless the authorization is terminated or revoked sooner.  Performed at [facility], [address] [place], [place] [address]          Radiology Studies: CT HEAD WO CONTRAST  Result Date: 05/24/2020 CLINICAL DATA:  LEFT leg weakness since Friday EXAM: CT HEAD WITHOUT CONTRAST TECHNIQUE: Contiguous axial images were obtained from the base of the skull through the vertex without intravenous contrast. COMPARISON:  December 04, 2018 FINDINGS: Brain: No evidence of acute infarction, hemorrhage, hydrocephalus, extra-axial collection or mass lesion/mass effect. Vascular: No hyperdense vessel or unexpected calcification. Skull: Normal. Negative for fracture or focal lesion. Sinuses/Orbits: Chronic opacification of the RIGHT maxillary sinus with similar appearance to the previous imaging study. Orbits are unremarkable. Other: None. IMPRESSION: 1. No acute intracranial abnormality. 2. Chronic RIGHT maxillary sinus disease. 3. Signs of trauma to the RIGHT nasal bone with similar appearance in terms of deformity. Electronically Signed   By: Donzetta Kohut M.D.   On: 05/24/2020 17:45   MR BRAIN WO CONTRAST  Result Date: 05/25/2020 CLINICAL DATA:  Initial evaluation for acute left lower extremity weakness. EXAM: MRI HEAD WITHOUT CONTRAST TECHNIQUE: Multiplanar, multiecho pulse sequences of the brain and surrounding structures were obtained without intravenous contrast. COMPARISON:  Prior head CT from 05/24/2020. FINDINGS: Brain: Generalized age-related cerebral atrophy. Minimal chronic microvascular ischemic changes for age. No abnormal foci of restricted diffusion to suggest acute or subacute ischemia. Gray-white matter differentiation maintained. No encephalomalacia to suggest chronic cortical infarction. No evidence for acute intracranial hemorrhage. Single punctate focus of susceptibility artifact noted at the left parietal region, consistent with a small chronic microhemorrhage, of doubtful significance in isolation. No mass lesion, midline shift or mass effect. No hydrocephalus or extra-axial fluid collection. Pituitary gland suprasellar region normal. Midline structures intact and normal. Vascular: Major intracranial vascular flow voids are maintained. Skull and upper cervical spine: Craniocervical junction within normal limits. Bone marrow signal intensity normal. Well-circumscribed subcentimeter T1 hyperintense lesion noted at the  right parietal calvarium, nonspecific, but most likely benign and of doubtful significance. No scalp soft tissue abnormality. Sinuses/Orbits: Globes and orbital soft tissues within normal limits. Chronic right maxillary sinusitis noted. Paranasal sinuses are otherwise largely clear. No mastoid effusion. Inner ear structures grossly normal. Other: None. IMPRESSION: 1. Negative brain MRI for age, with no acute intracranial abnormality identified. 2. Chronic right maxillary sinusitis. Electronically Signed   By: Rise Mu M.D.   On: 05/25/2020 03:29        Scheduled Meds: . aspirin  324 mg Oral Once  . enoxaparin (LOVENOX) injection  40 mg Subcutaneous Q24H  . ferrous sulfate  325 mg Oral Q breakfast  . folic acid  1 mg Oral Daily  . gabapentin  1,200 mg Oral QHS  . gabapentin  1,500 mg Oral Daily  . levothyroxine  125 mcg Oral Once per day on Tue Fri  . [START ON 05/26/2020] levothyroxine  150 mcg Oral Once per day on Sun Mon Wed Thu Sat  . pantoprazole  40 mg Oral Daily  . rosuvastatin  5 mg Oral Daily  . thiamine  100 mg  Oral Daily   Or  . thiamine  100 mg Intravenous Daily   Continuous Infusions:   LOS: 0 days    Time spent: [id number]    Berton Mount, MD  [facility] Pager #: [phone] 7PM-7AM contact night coverage as above

## 2020-05-26 ENCOUNTER — Observation Stay (HOSPITAL_COMMUNITY): Payer: Medicare PPO | Admitting: Anesthesiology

## 2020-05-26 ENCOUNTER — Encounter (HOSPITAL_COMMUNITY)
Admission: EM | Disposition: A | Discharge status: Rehabilitation Facility | Payer: Self-pay | Source: Home / Self Care | Attending: Internal Medicine

## 2020-05-26 ENCOUNTER — Observation Stay (HOSPITAL_COMMUNITY): Payer: Medicare PPO

## 2020-05-26 DIAGNOSIS — M4802 Spinal stenosis, cervical region: Secondary | ICD-10-CM | POA: Diagnosis not present

## 2020-05-26 DIAGNOSIS — M4722 Other spondylosis with radiculopathy, cervical region: Secondary | ICD-10-CM | POA: Diagnosis not present

## 2020-05-26 DIAGNOSIS — R29898 Other symptoms and signs involving the musculoskeletal system: Secondary | ICD-10-CM | POA: Diagnosis not present

## 2020-05-26 DIAGNOSIS — M5126 Other intervertebral disc displacement, lumbar region: Secondary | ICD-10-CM | POA: Diagnosis not present

## 2020-05-26 DIAGNOSIS — Z981 Arthrodesis status: Secondary | ICD-10-CM | POA: Diagnosis not present

## 2020-05-26 DIAGNOSIS — E782 Mixed hyperlipidemia: Secondary | ICD-10-CM

## 2020-05-26 DIAGNOSIS — M47816 Spondylosis without myelopathy or radiculopathy, lumbar region: Secondary | ICD-10-CM | POA: Diagnosis not present

## 2020-05-26 DIAGNOSIS — M48061 Spinal stenosis, lumbar region without neurogenic claudication: Secondary | ICD-10-CM | POA: Diagnosis not present

## 2020-05-26 HISTORY — PX: ANTERIOR CERVICAL DECOMP/DISCECTOMY FUSION: SHX1161

## 2020-05-26 LAB — CBC
HCT: 38.5 % (ref 36.0–46.0)
Hemoglobin: 12.8 g/dL (ref 12.0–15.0)
MCH: 35.9 pg — ABNORMAL HIGH (ref 26.0–34.0)
MCHC: 33.2 g/dL (ref 30.0–36.0)
MCV: 107.8 fL — ABNORMAL HIGH (ref 80.0–100.0)
Platelets: 215 10*3/uL (ref 150–400)
RBC: 3.57 MIL/uL — ABNORMAL LOW (ref 3.87–5.11)
RDW: 13.4 % (ref 11.5–15.5)
WBC: 5.6 10*3/uL (ref 4.0–10.5)
nRBC: 0 % (ref 0.0–0.2)

## 2020-05-26 LAB — PROTIME-INR
INR: 1 (ref 0.8–1.2)
Prothrombin Time: 13.2 seconds (ref 11.4–15.2)

## 2020-05-26 LAB — BASIC METABOLIC PANEL
Anion gap: 8 (ref 5–15)
BUN: 16 mg/dL (ref 8–23)
CO2: 25 mmol/L (ref 22–32)
Calcium: 8.8 mg/dL — ABNORMAL LOW (ref 8.9–10.3)
Chloride: 103 mmol/L (ref 98–111)
Creatinine, Ser: 0.82 mg/dL (ref 0.44–1.00)
GFR calc Af Amer: >60 mL/min (ref >60)
GFR calc non Af Amer: >60 mL/min (ref >60)
Glucose, Bld: 119 mg/dL — ABNORMAL HIGH (ref 70–99)
Potassium: 4 mmol/L (ref 3.5–5.1)
Sodium: 136 mmol/L (ref 135–145)

## 2020-05-26 IMAGING — MR MR CERVICAL SPINE W/O CM
5 series · 35 of 48 positions shown · non-contrast
Comparison: CT cervical spine [DATE]
COMPARISON: CT cervical spine [DATE]

Addendum:
CLINICAL DATA: Cervical radiculopathy no red flags

EXAM:
MRI CERVICAL SPINE WITHOUT CONTRAST
TECHNIQUE: Multiplanar, multisequence MR imaging of the cervical spine was
performed. No intravenous contrast was administered.

[Series 5: T2 · sagittal · 3.0mm · 0.69mm/px · 6 of 15 slices shown (1 of 2)]
[im 1/15]
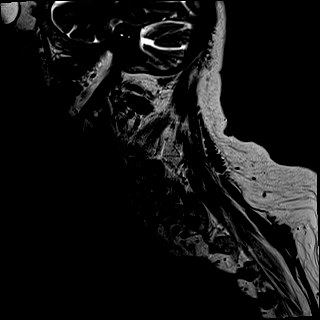
[im 3/15]
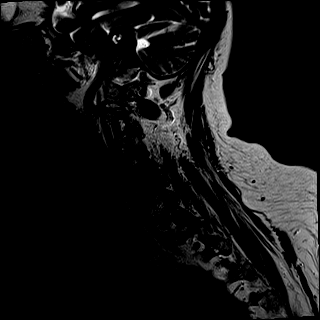
[im 6/15]
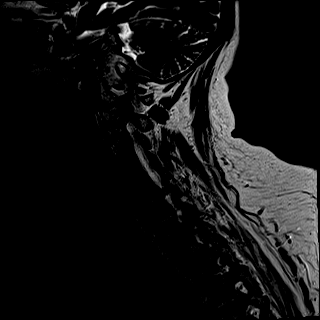
[im 9/15]
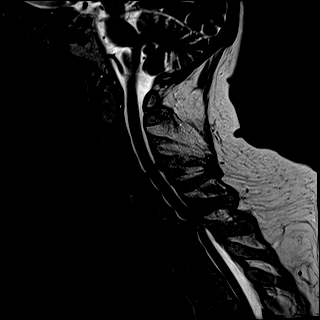
[im 12/15]
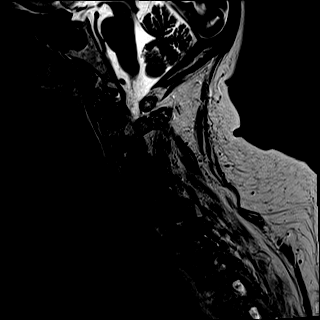
[im 15/15]
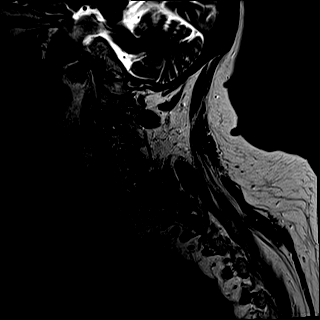

[Series 6: T1 · sagittal · 3.0mm · 0.69mm/px · 6 of 15 slices shown]
[im 1/15]
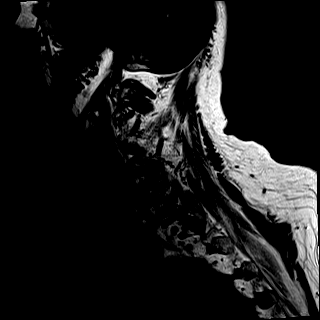
[im 3/15]
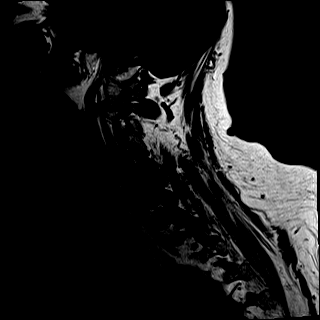
[im 6/15]
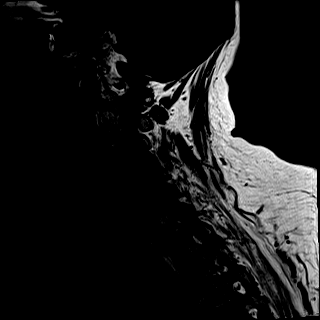
[im 9/15]
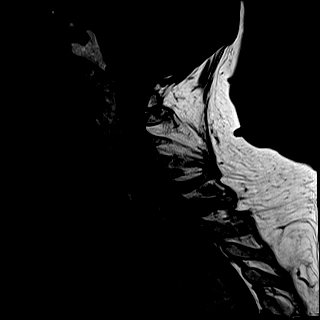
[im 12/15]
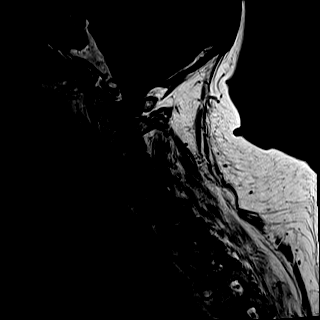
[im 15/15]
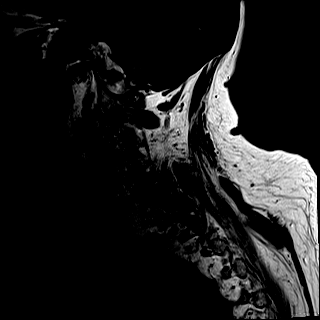

[Series 7: STIR · sagittal · 3.0mm · 0.86mm/px · 6 of 15 slices shown]
[im 1/15]
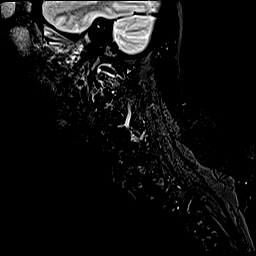
[im 3/15]
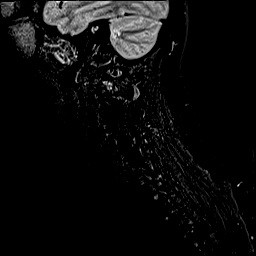
[im 6/15]
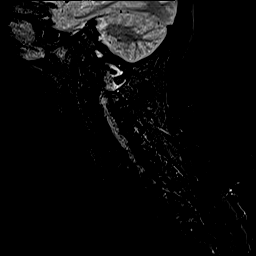
[im 9/15]
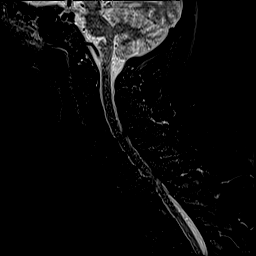
[im 12/15]
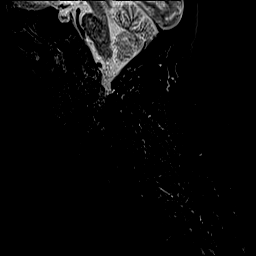
[im 15/15]
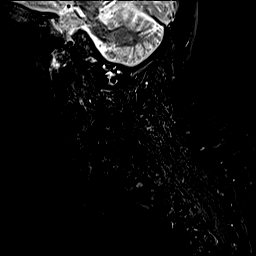

[Series 8: T2 · axial · 3.0mm · 0.78mm/px · z∈[-152,-42]mm · 9 of 40 slices shown (2 of 2)]
[im 1/40]
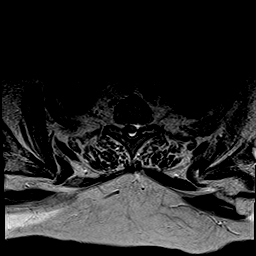
[im 6/40]
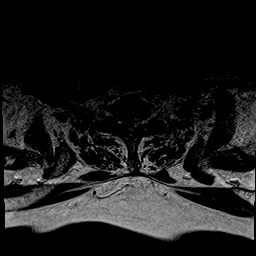
[im 12/40]
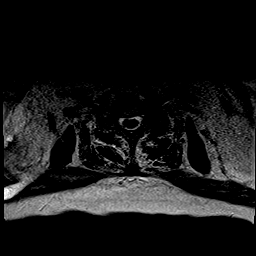
[im 17/40]
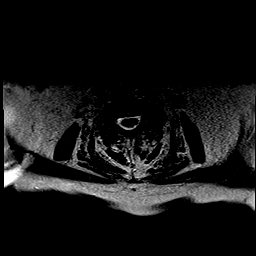
[im 20/40]
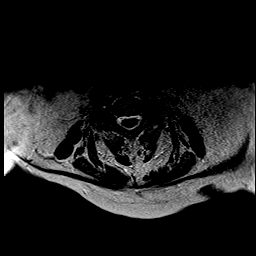
[im 23/40]
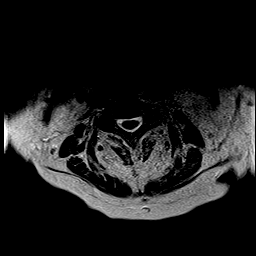
[im 28/40]
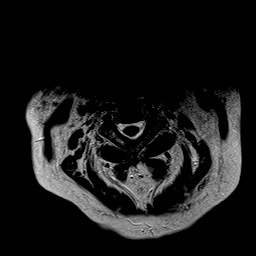
[im 34/40]
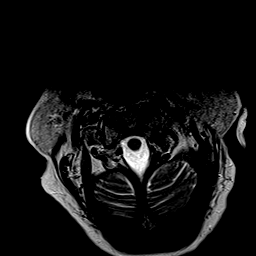
[im 40/40]
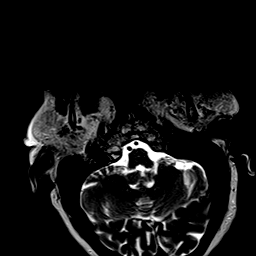

[Series 9: GRE · axial · 3.0mm · 0.39mm/px · z∈[-152,-42]mm · 8 of 40 slices shown]
[im 1/40]
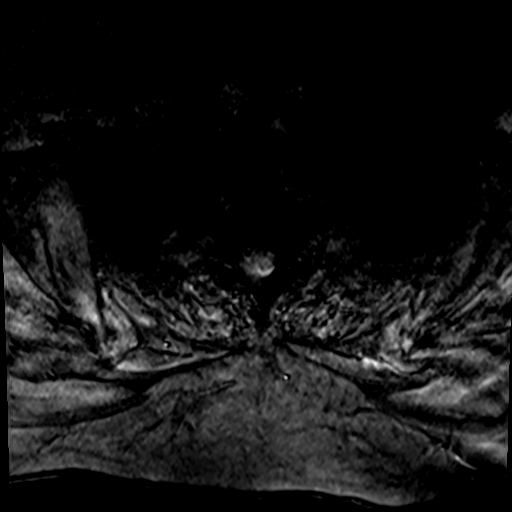
[im 6/40]
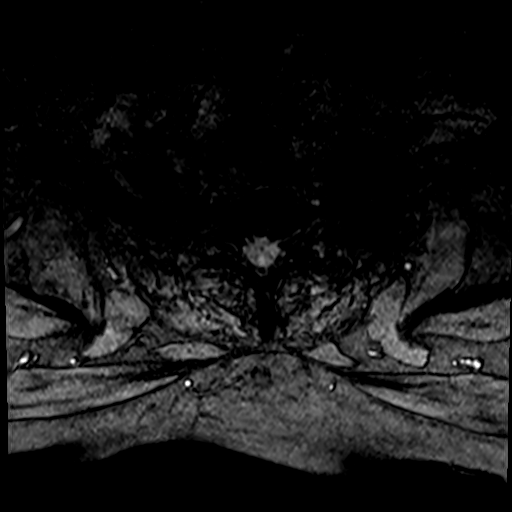
[im 12/40]
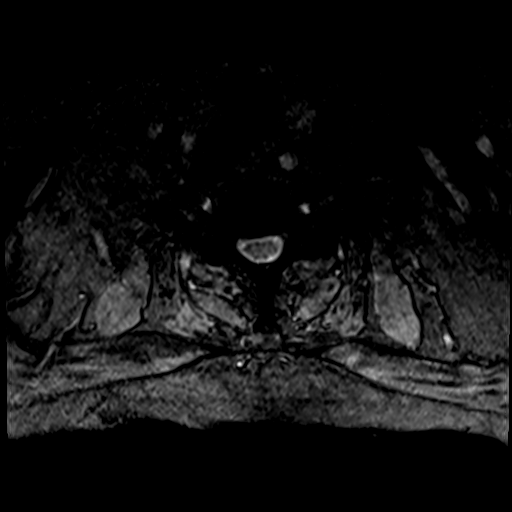
[im 17/40]
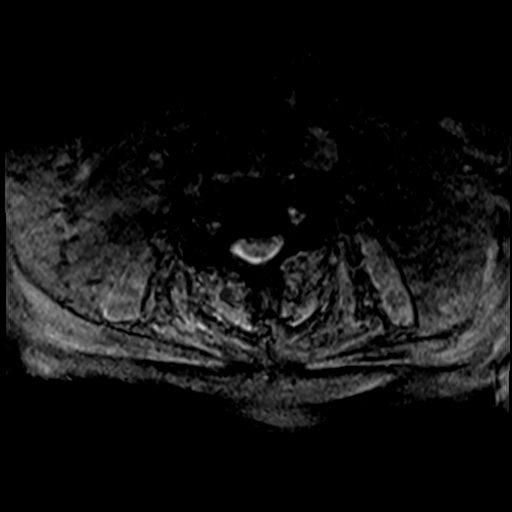
[im 23/40]
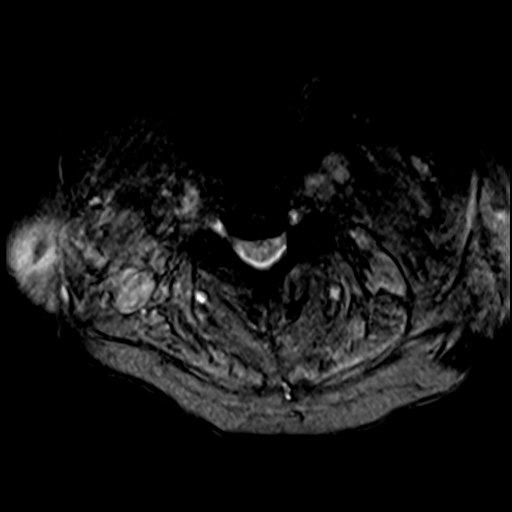
[im 28/40]
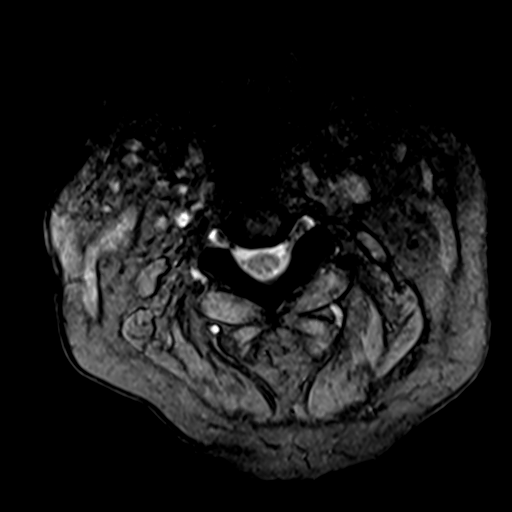
[im 34/40]
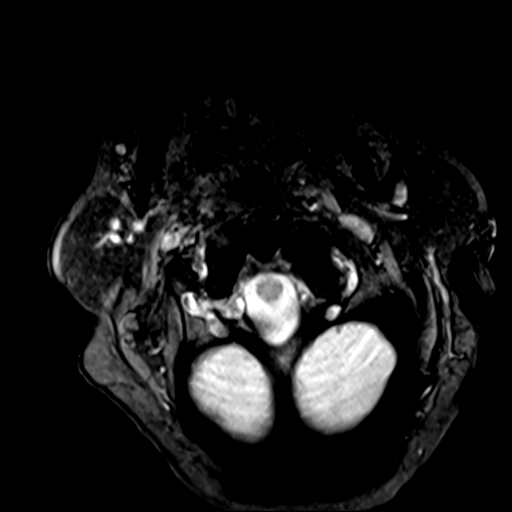
[im 40/40]
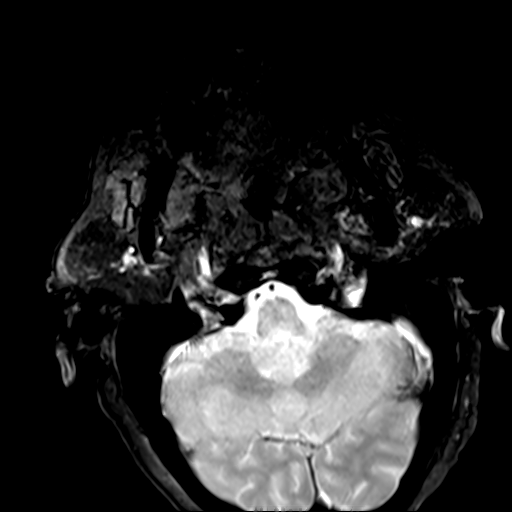

[35 of 48 positions shown; findings below may reference images not displayed]

FINDINGS: Alignment: Mild anterolisthesis C4-5.  2 mm anterolisthesis C7-T1.

Vertebrae: Negative for fracture or mass.

Cord: Cord compression at C7-T1 with cord hyperintensity which is
ill-defined. This may be acute. Remainder of the cord signal is
normal.

Posterior Fossa, vertebral arteries, paraspinal tissues: Negative

Disc levels:

C2-3: Negative

C3-4: Bilateral facet degeneration and mild disc degeneration. Mild
foraminal stenosis bilaterally due to spurring.

C4-5: Moderate facet degeneration bilaterally. Disc degeneration and
spurring. Moderate foraminal encroachment bilaterally due to
spurring.

C5-6: Bilateral facet degeneration. Disc degeneration and diffuse
uncinate spurring. Severe right foraminal encroachment and moderate
to severe left foraminal encroachment due to spurring. Mild spinal
stenosis.

C6-7: Disc degeneration with diffuse uncinate spurring. Bilateral
facet hypertrophy. Mild spinal stenosis and moderate foraminal
stenosis bilaterally.

C7-T1: Large extruded disc fragment to the left of midline. There is
cord compression and cord hyperintensity. There is bilateral facet
hypertrophy and mild foraminal stenosis bilaterally.
IMPRESSION: Multilevel degenerative change. Disc and facet degeneration and
spurring causing foraminal encroachment at multiple levels as above.

Large extruded disc fragment to the left of midline at C7-T1. There
is cord compression and ill-defined cord hyperintensity which may be
acute.

ADDENDUM:
These results were called by telephone at the time of interpretation
on [DATE] at [DATE] to provider JOANNY , who verbally
acknowledged these results.

*** End of Addendum ***
FINDINGS: Alignment: Mild anterolisthesis C4-5.  2 mm anterolisthesis C7-T1.

Vertebrae: Negative for fracture or mass.

Cord: Cord compression at C7-T1 with cord hyperintensity which is
ill-defined. This may be acute. Remainder of the cord signal is
normal.

Posterior Fossa, vertebral arteries, paraspinal tissues: Negative

Disc levels:

C2-3: Negative

C3-4: Bilateral facet degeneration and mild disc degeneration. Mild
foraminal stenosis bilaterally due to spurring.

C4-5: Moderate facet degeneration bilaterally. Disc degeneration and
spurring. Moderate foraminal encroachment bilaterally due to
spurring.

C5-6: Bilateral facet degeneration. Disc degeneration and diffuse
uncinate spurring. Severe right foraminal encroachment and moderate
to severe left foraminal encroachment due to spurring. Mild spinal
stenosis.

C6-7: Disc degeneration with diffuse uncinate spurring. Bilateral
facet hypertrophy. Mild spinal stenosis and moderate foraminal
stenosis bilaterally.

C7-T1: Large extruded disc fragment to the left of midline. There is
cord compression and cord hyperintensity. There is bilateral facet
hypertrophy and mild foraminal stenosis bilaterally.
IMPRESSION: Multilevel degenerative change. Disc and facet degeneration and
spurring causing foraminal encroachment at multiple levels as above.

Large extruded disc fragment to the left of midline at C7-T1. There
is cord compression and ill-defined cord hyperintensity which may be
acute.

## 2020-05-26 IMAGING — CR DG CERVICAL SPINE 1V
1 series · 1 of 1 positions shown · non-contrast
Comparison: MRI earlier today.

CLINICAL DATA: Surgery.

EXAM:
DG CERVICAL SPINE - 1 VIEW

[AP]
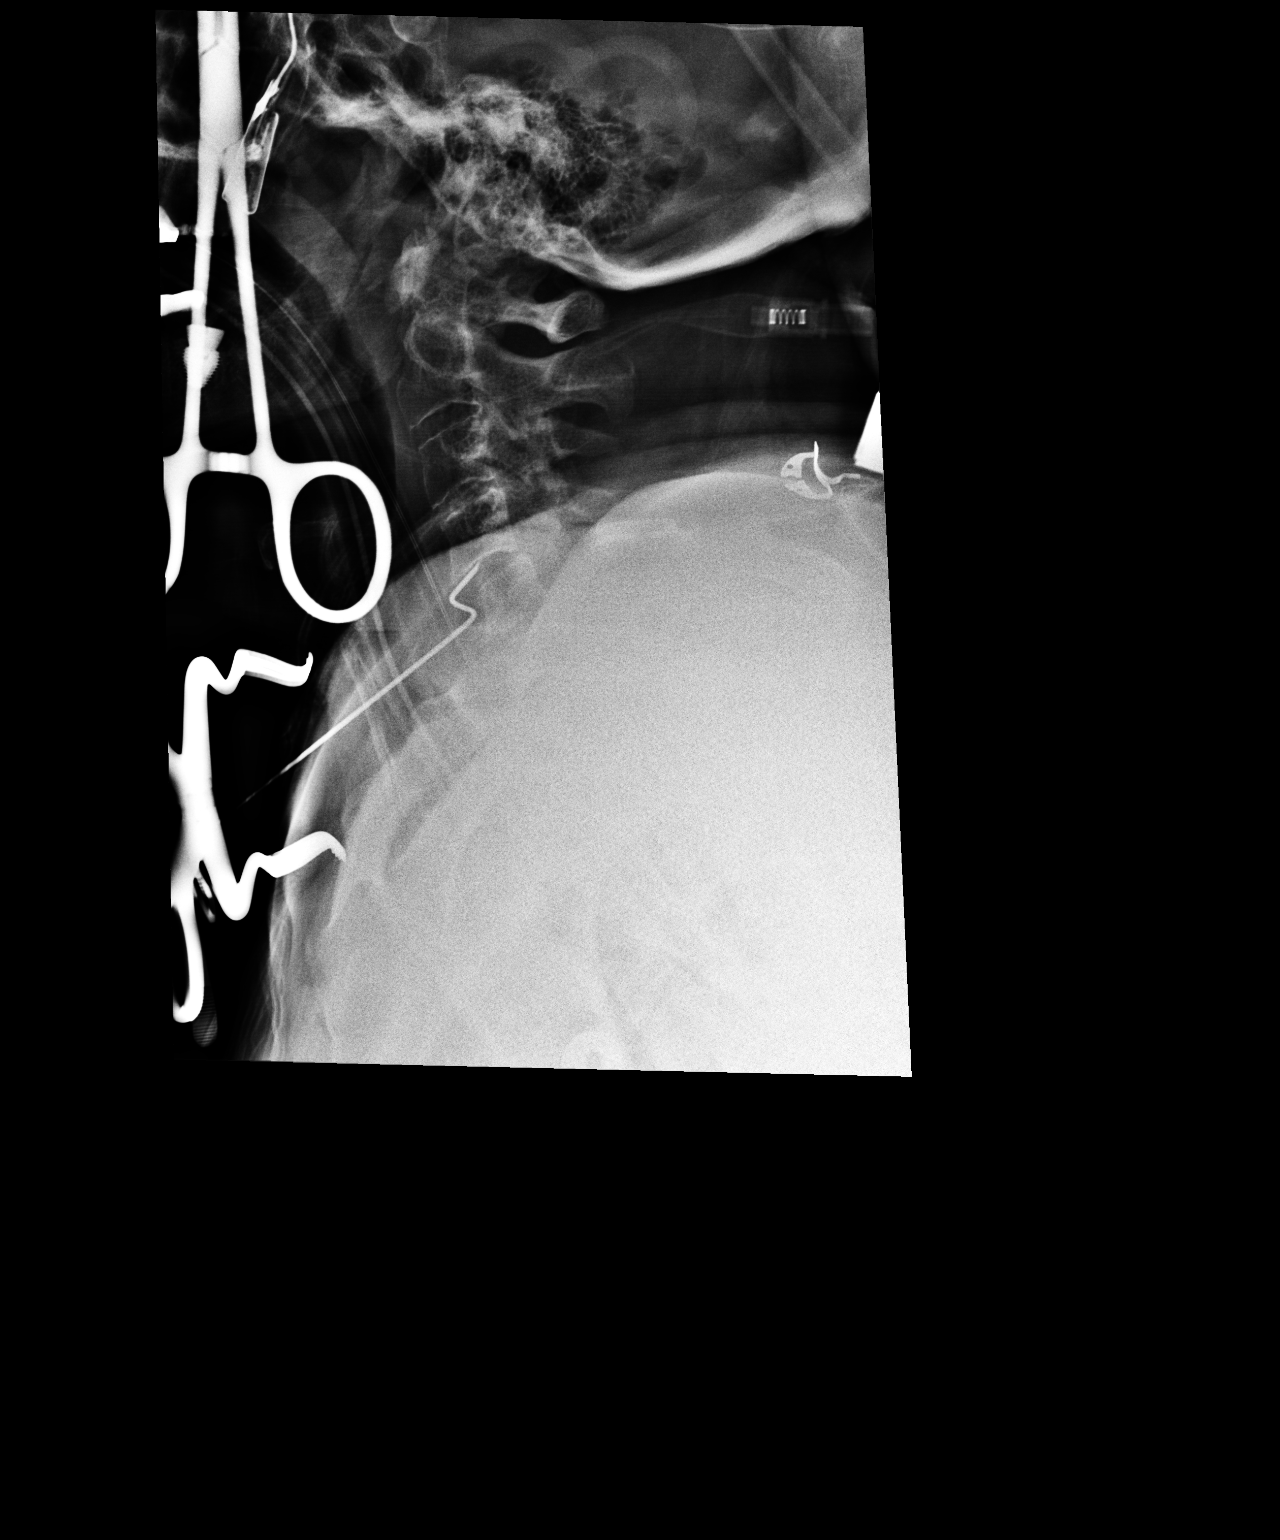

[1 of 1 positions shown; findings below may reference images not displayed]

FINDINGS: Single lateral view of the cervical spine demonstrates surgical
instrument localizing anteriorly at the level of C4-C5.
IMPRESSION: Surgical instrument localizing anteriorly at the level of C4-C5.

## 2020-05-26 IMAGING — MR MR LUMBAR SPINE W/O CM
5 series · 31 of 48 positions shown · non-contrast
Comparison: None.

CLINICAL DATA: Low back pain.  Progressive neurologic deficit.

EXAM:
MRI LUMBAR SPINE WITHOUT CONTRAST
TECHNIQUE: Multiplanar, multisequence MR imaging of the lumbar spine was
performed. No intravenous contrast was administered.

[Series 5: T2 · sagittal · 4.0mm · 0.73mm/px · 7 of 16 slices shown (1 of 2)]
[im 1/16]
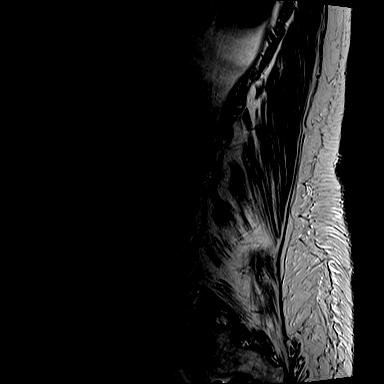
[im 3/16]
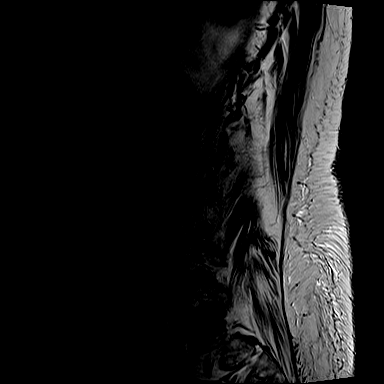
[im 6/16]
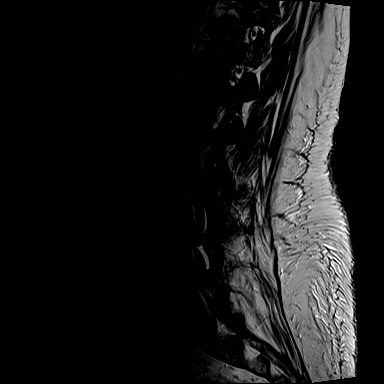
[im 8/16]
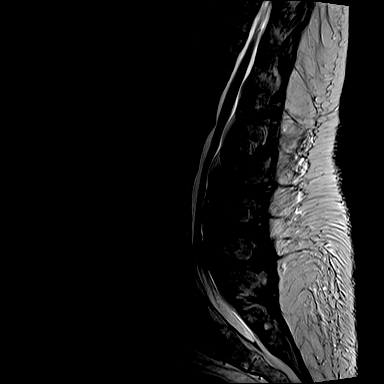
[im 11/16]
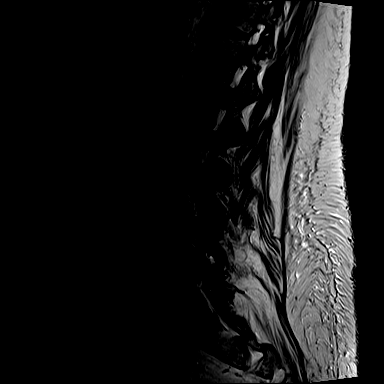
[im 13/16]
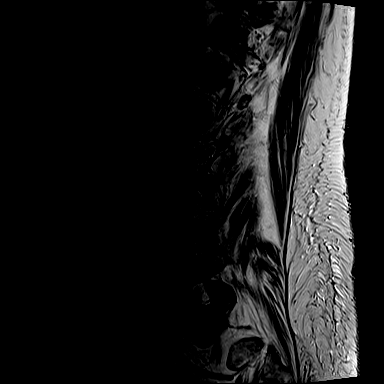
[im 16/16]
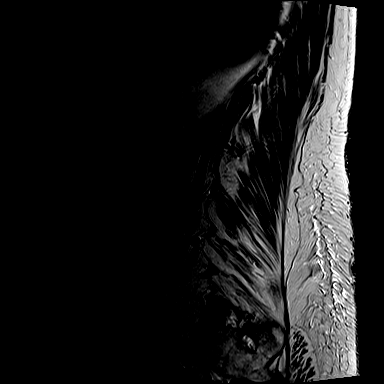

[Series 6: STIR · sagittal · 4.0mm · 0.55mm/px · 2 of 16 slices shown]
[im 1/16]
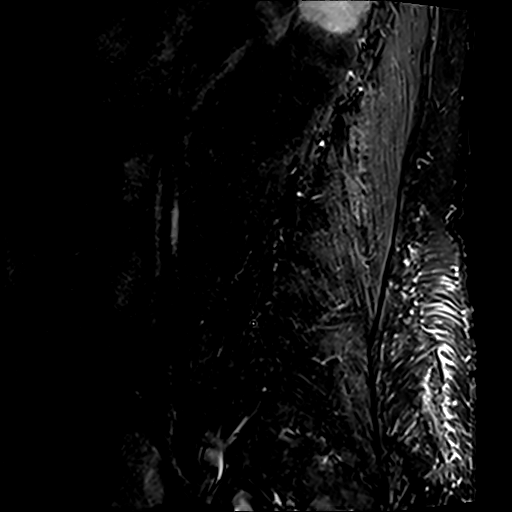
[im 3/16]
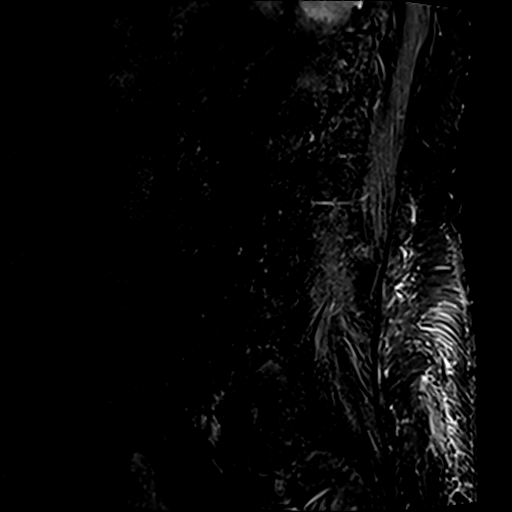

[Series 7: T1 · sagittal · 4.0mm · 0.88mm/px · 6 of 16 slices shown (1 of 2)]
[im 1/16]
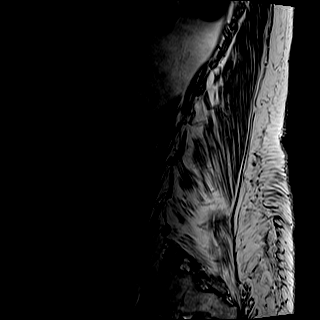
[im 4/16]
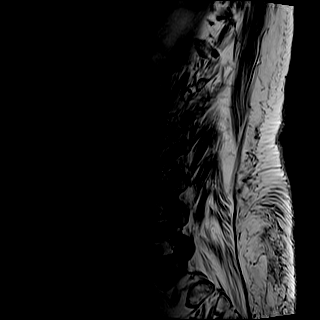
[im 7/16]
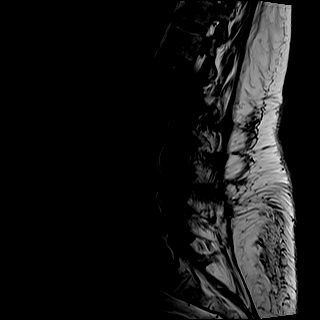
[im 10/16]
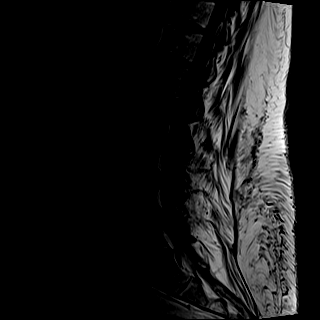
[im 13/16]
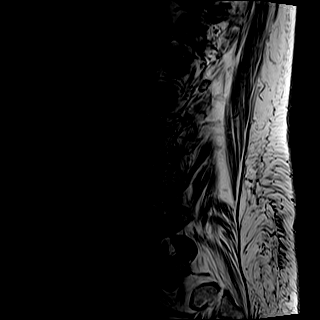
[im 16/16]
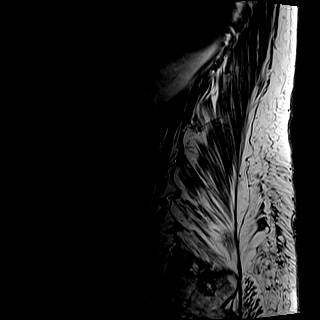

[Series 8: T2 · axial · 4.0mm · 0.86mm/px · z∈[-533,-323]mm · 8 of 36 slices shown (2 of 2)]
[im 1/36]
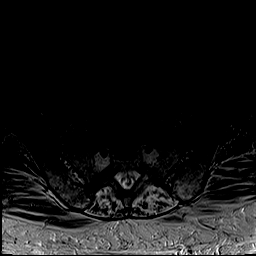
[im 6/36]
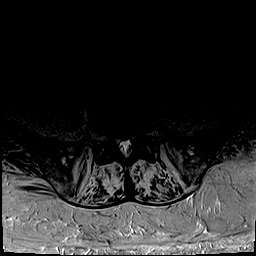
[im 11/36]
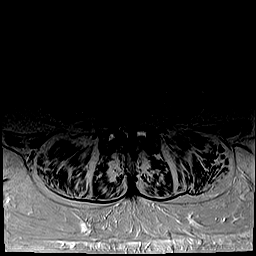
[im 17/36]
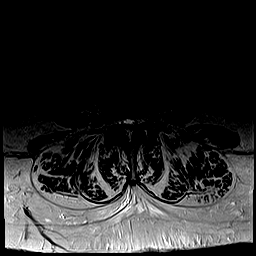
[im 19/36]
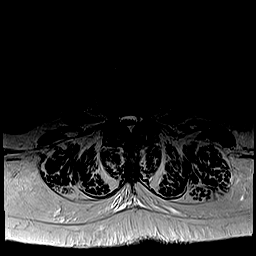
[im 25/36]
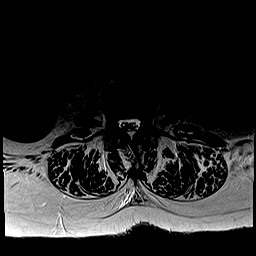
[im 30/36]
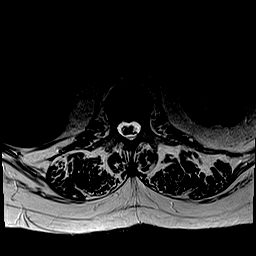
[im 36/36]
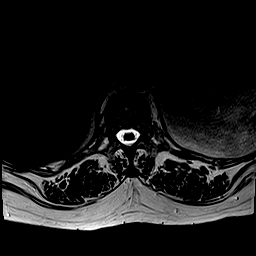

[Series 9: T1 · axial · 4.0mm · 0.43mm/px · z∈[-533,-323]mm · 8 of 36 slices shown (2 of 2)]
[im 1/36]
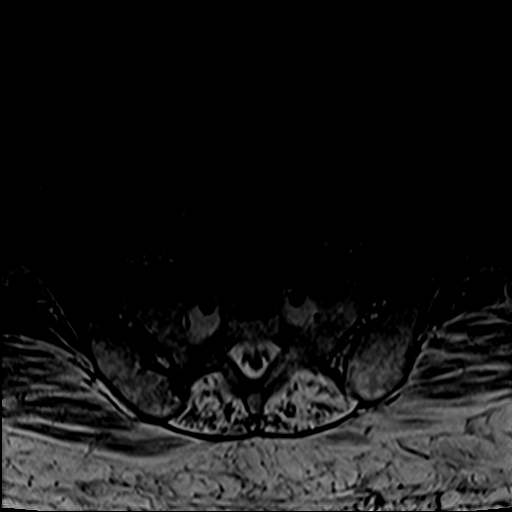
[im 6/36]
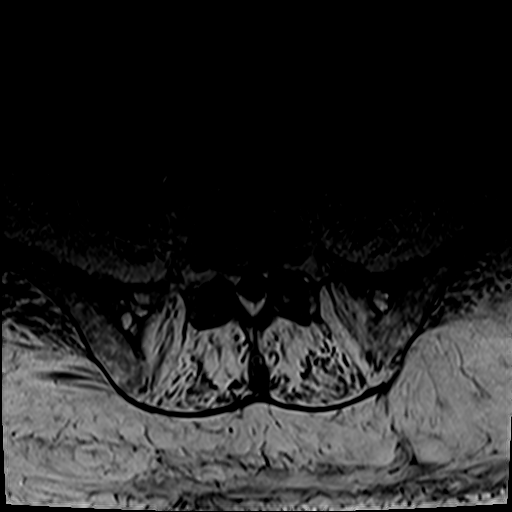
[im 11/36]
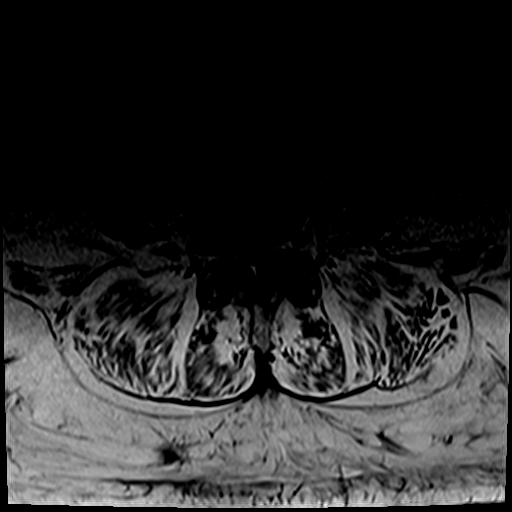
[im 17/36]
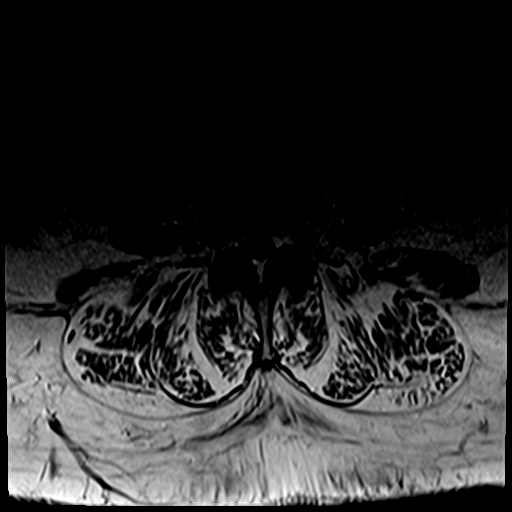
[im 19/36]
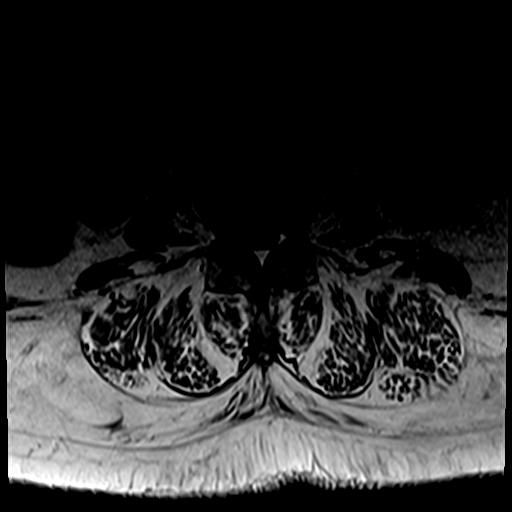
[im 25/36]
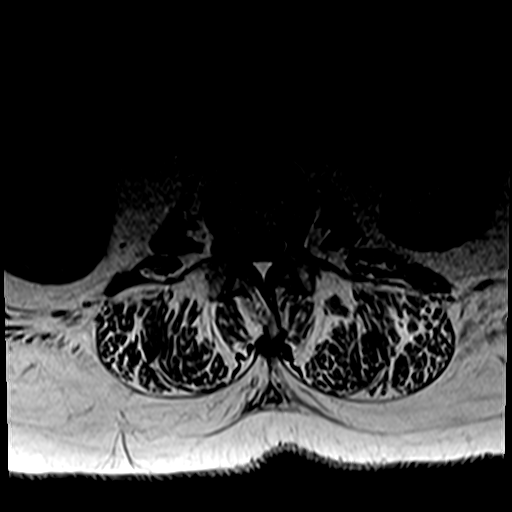
[im 30/36]
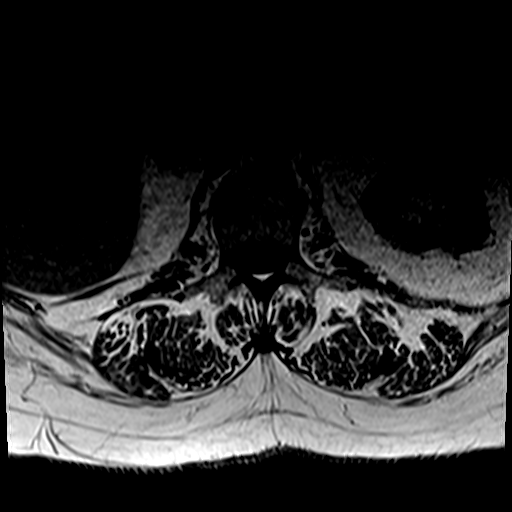
[im 36/36]
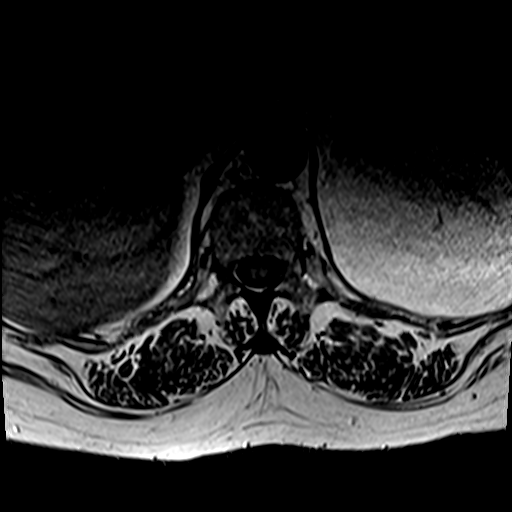

[31 of 48 positions shown; findings below may reference images not displayed]

FINDINGS: Segmentation:  Normal

Alignment:  Mild anterolisthesis L3-4 L4-5

Vertebrae:  Normal bone marrow.  Negative for fracture or mass.

Conus medullaris and cauda equina: Conus extends to the L2 level.
Conus and cauda equina appear normal.

Paraspinal and other soft tissues: Negative for paraspinous mass or
adenopathy. No soft tissue edema or fluid collection. Diffuse
muscular atrophy.

Disc levels:

L1-2: Broad-based central disc protrusion and mild facet
degeneration. Negative for stenosis

L2-3: Mild disc bulging and moderate facet hypertrophy bilaterally.
Mild subarticular stenosis bilaterally

L3-4: Disc degeneration with diffuse bulging of the disc. Moderate
to advanced facet hypertrophy bilaterally. Mild spinal stenosis and
mild subarticular stenosis bilaterally.

L4-5: Small right foraminal disc protrusion displacing the right L4
nerve root. Severe facet degeneration. Moderate spinal stenosis and
moderate subarticular stenosis bilaterally.

L5-S1: Small central disc protrusion and mild facet degeneration.
Negative for spinal or foraminal stenosis.
IMPRESSION: Multilevel degenerative change throughout the lumbar spine. Negative
for lumbar fracture

Mild spinal stenosis L3-4 with mild subarticular stenosis
bilaterally.

Moderate spinal stenosis at L4-5 with moderate subarticular stenosis
bilaterally.

## 2020-05-26 SURGERY — ANTERIOR CERVICAL DECOMPRESSION/DISCECTOMY FUSION 2 LEVEL/HARDWARE REMOVAL
Anesthesia: General | Site: Neck | Laterality: Left

## 2020-05-26 MED ORDER — BACITRACIN ZINC 500 UNIT/GM EX OINT
TOPICAL_OINTMENT | CUTANEOUS | Status: AC
  Filled 2020-05-26: qty 28.35

## 2020-05-26 MED ORDER — THROMBIN 5000 UNITS EX SOLR
OROMUCOSAL | Status: DC | PRN
  Administered 2020-05-26: 5 mL via TOPICAL

## 2020-05-26 MED ORDER — BUPIVACAINE-EPINEPHRINE 0.5% -1:200000 IJ SOLN
INTRAMUSCULAR | Status: DC | PRN
  Administered 2020-05-26: 10 mL

## 2020-05-26 MED ORDER — LACTATED RINGERS IV SOLN: INTRAVENOUS | Status: DC | PRN

## 2020-05-26 MED ORDER — FENTANYL CITRATE (PF) 250 MCG/5ML IJ SOLN
INTRAMUSCULAR | Status: DC | PRN
  Administered 2020-05-26: 100 ug via INTRAVENOUS
  Administered 2020-05-26 (×2): 50 ug via INTRAVENOUS

## 2020-05-26 MED ORDER — CEFAZOLIN SODIUM-DEXTROSE 2-4 GM/100ML-% IV SOLN
INTRAVENOUS | Status: AC
  Administered 2020-05-27: 2000 mg
  Filled 2020-05-26: qty 100

## 2020-05-26 MED ORDER — SODIUM CHLORIDE 0.9 % IV SOLN
INTRAVENOUS | Status: DC | PRN
  Administered 2020-05-26: 500 mL

## 2020-05-26 MED ORDER — THROMBIN 20000 UNITS EX SOLR
CUTANEOUS | Status: DC | PRN
  Administered 2020-05-26: 20 mL via TOPICAL

## 2020-05-26 MED ORDER — SUCCINYLCHOLINE CHLORIDE 200 MG/10ML IV SOSY
PREFILLED_SYRINGE | INTRAVENOUS | Status: DC | PRN
  Administered 2020-05-26: 120 mg via INTRAVENOUS

## 2020-05-26 MED ORDER — FENTANYL CITRATE (PF) 250 MCG/5ML IJ SOLN
INTRAMUSCULAR | Status: AC
  Filled 2020-05-26: qty 5

## 2020-05-26 MED ORDER — TRAMADOL HCL 50 MG PO TABS
50.0000 mg | ORAL_TABLET | Freq: Every day | ORAL | Status: DC | PRN
  Administered 2020-05-26 – 2020-05-29 (×3): 50 mg via ORAL
  Filled 2020-05-26 (×3): qty 1

## 2020-05-26 MED ORDER — BUPIVACAINE-EPINEPHRINE 0.5% -1:200000 IJ SOLN
INTRAMUSCULAR | Status: AC
  Filled 2020-05-26: qty 1

## 2020-05-26 MED ORDER — THROMBIN 5000 UNITS EX SOLR
CUTANEOUS | Status: AC
  Filled 2020-05-26: qty 5000

## 2020-05-26 MED ORDER — ALPRAZOLAM 0.5 MG PO TABS
0.5000 mg | ORAL_TABLET | ORAL | Status: DC | PRN
  Administered 2020-05-26: 0.5 mg via ORAL
  Filled 2020-05-26: qty 1

## 2020-05-26 MED ORDER — DEXAMETHASONE SODIUM PHOSPHATE 10 MG/ML IJ SOLN
10.0000 mg | Freq: Once | INTRAMUSCULAR | Status: AC
  Administered 2020-05-26: 10 mg via INTRAVENOUS
  Filled 2020-05-26: qty 1

## 2020-05-26 MED ORDER — LABETALOL HCL 5 MG/ML IV SOLN
10.0000 mg | Freq: Once | INTRAVENOUS | Status: AC
  Administered 2020-05-26: 10 mg via INTRAVENOUS
  Filled 2020-05-26: qty 4

## 2020-05-26 MED ORDER — CYCLOBENZAPRINE HCL 10 MG PO TABS
10.0000 mg | ORAL_TABLET | Freq: Every day | ORAL | Status: DC | PRN
  Administered 2020-05-26: 10 mg via ORAL
  Filled 2020-05-26: qty 1

## 2020-05-26 MED ORDER — ROCURONIUM BROMIDE 10 MG/ML (PF) SYRINGE
PREFILLED_SYRINGE | INTRAVENOUS | Status: DC | PRN
  Administered 2020-05-26: 20 mg via INTRAVENOUS
  Administered 2020-05-26: 30 mg via INTRAVENOUS
  Administered 2020-05-26: 20 mg via INTRAVENOUS
  Administered 2020-05-26: 10 mg via INTRAVENOUS

## 2020-05-26 MED ORDER — LIDOCAINE 2% (20 MG/ML) 5 ML SYRINGE
INTRAMUSCULAR | Status: DC | PRN
  Administered 2020-05-26: 60 mg via INTRAVENOUS

## 2020-05-26 MED ORDER — THROMBIN 20000 UNITS EX SOLR
CUTANEOUS | Status: AC
  Filled 2020-05-26: qty 20000

## 2020-05-26 MED ORDER — DEXAMETHASONE SODIUM PHOSPHATE 4 MG/ML IJ SOLN: 4.0000 mg | Freq: Four times a day (QID) | INTRAMUSCULAR | Status: DC

## 2020-05-26 MED ORDER — CEFAZOLIN SODIUM-DEXTROSE 2-3 GM-%(50ML) IV SOLR
INTRAVENOUS | Status: DC | PRN
  Administered 2020-05-26: 2 g via INTRAVENOUS

## 2020-05-26 MED ORDER — 0.9 % SODIUM CHLORIDE (POUR BTL) OPTIME
TOPICAL | Status: DC | PRN
  Administered 2020-05-26: 1000 mL

## 2020-05-26 MED ORDER — PROPOFOL 10 MG/ML IV BOLUS
INTRAVENOUS | Status: DC | PRN
  Administered 2020-05-26: 150 mg via INTRAVENOUS

## 2020-05-26 MED ORDER — PROPOFOL 10 MG/ML IV BOLUS
INTRAVENOUS | Status: AC
  Filled 2020-05-26: qty 20

## 2020-05-26 SURGICAL SUPPLY — 65 items
BAG DECANTER FOR FLEXI CONT (MISCELLANEOUS) ×3 IMPLANT
BAND RUBBER #18 3X1/16 STRL (MISCELLANEOUS) ×6 IMPLANT
BENZOIN TINCTURE PRP APPL 2/3 (GAUZE/BANDAGES/DRESSINGS) ×3 IMPLANT
BIT DRILL NEURO 2X3.1 SFT TUCH (MISCELLANEOUS) ×1 IMPLANT
BLADE SURG 15 STRL LF DISP TIS (BLADE) ×1 IMPLANT
BLADE SURG 15 STRL SS (BLADE) ×3
BLADE ULTRA TIP 2M (BLADE) ×3 IMPLANT
BUR BARREL STRAIGHT FLUTE 4.0 (BURR) ×3 IMPLANT
BUR MATCHSTICK NEURO 3.0 LAGG (BURR) ×3 IMPLANT
CANISTER SUCT 3000ML PPV (MISCELLANEOUS) ×3 IMPLANT
CARTRIDGE OIL MAESTRO DRILL (MISCELLANEOUS) ×1 IMPLANT
CLIP VESOCCLUDE MED 6/CT (CLIP) ×3 IMPLANT
CLOSURE STERI-STRIP 1/2X4 (GAUZE/BANDAGES/DRESSINGS) ×1
CLOSURE WOUND 1/2 X4 (GAUZE/BANDAGES/DRESSINGS) ×1
CLSR STERI-STRIP ANTIMIC 1/2X4 (GAUZE/BANDAGES/DRESSINGS) ×2 IMPLANT
COVER MAYO STAND STRL (DRAPES) ×3 IMPLANT
COVER WAND RF STERILE (DRAPES) ×3 IMPLANT
DECANTER SPIKE VIAL GLASS SM (MISCELLANEOUS) ×3 IMPLANT
DIFFUSER DRILL AIR PNEUMATIC (MISCELLANEOUS) ×3 IMPLANT
DRAPE LAPAROTOMY 100X72 PEDS (DRAPES) ×3 IMPLANT
DRAPE MICROSCOPE LEICA (MISCELLANEOUS) ×3 IMPLANT
DRAPE SURG 17X23 STRL (DRAPES) ×6 IMPLANT
DRILL NEURO 2X3.1 SOFT TOUCH (MISCELLANEOUS) ×3
ELECT BLADE 4.0 EZ CLEAN MEGAD (MISCELLANEOUS) ×3
ELECT REM PT RETURN 9FT ADLT (ELECTROSURGICAL) ×3
ELECTRODE BLDE 4.0 EZ CLN MEGD (MISCELLANEOUS) ×1 IMPLANT
ELECTRODE REM PT RTRN 9FT ADLT (ELECTROSURGICAL) ×1 IMPLANT
GAUZE 4X4 16PLY RFD (DISPOSABLE) IMPLANT
GAUZE SPONGE 4X4 12PLY STRL (GAUZE/BANDAGES/DRESSINGS) ×3 IMPLANT
GLOVE BIO SURGEON STRL SZ7 (GLOVE) ×9 IMPLANT
GLOVE BIO SURGEON STRL SZ8 (GLOVE) ×3 IMPLANT
GLOVE BIO SURGEON STRL SZ8.5 (GLOVE) ×3 IMPLANT
GLOVE BIOGEL PI IND STRL 7.5 (GLOVE) ×2 IMPLANT
GLOVE BIOGEL PI INDICATOR 7.5 (GLOVE) ×4
GLOVE EXAM NITRILE XL STR (GLOVE) IMPLANT
GOWN STRL REUS W/ TWL LRG LVL3 (GOWN DISPOSABLE) IMPLANT
GOWN STRL REUS W/ TWL XL LVL3 (GOWN DISPOSABLE) IMPLANT
GOWN STRL REUS W/TWL LRG LVL3 (GOWN DISPOSABLE)
GOWN STRL REUS W/TWL XL LVL3 (GOWN DISPOSABLE)
HEMOSTAT POWDER KIT SURGIFOAM (HEMOSTASIS) ×3 IMPLANT
KIT BASIN OR (CUSTOM PROCEDURE TRAY) ×3 IMPLANT
KIT TURNOVER KIT B (KITS) ×3 IMPLANT
MARKER SKIN DUAL TIP RULER LAB (MISCELLANEOUS) ×3 IMPLANT
NEEDLE HYPO 22GX1.5 SAFETY (NEEDLE) ×3 IMPLANT
NEEDLE HYPO 25X1 1.5 SAFETY (NEEDLE) ×3 IMPLANT
NEEDLE SPNL 18GX3.5 QUINCKE PK (NEEDLE) ×3 IMPLANT
NS IRRIG 1000ML POUR BTL (IV SOLUTION) ×3 IMPLANT
OIL CARTRIDGE MAESTRO DRILL (MISCELLANEOUS) ×3
PACK LAMINECTOMY NEURO (CUSTOM PROCEDURE TRAY) ×3 IMPLANT
PIN DISTRACTION 14MM (PIN) ×6 IMPLANT
PLATE ANT CERV XTEND 1 LV 18 (Plate) ×3 IMPLANT
PUTTY DBM 2CC CALC GRAN (Putty) ×3 IMPLANT
SCREW XTD VAR 4.2 SELF TAP 12 (Screw) ×12 IMPLANT
SEALANT ADHERUS EXTEND TIP (MISCELLANEOUS) ×3 IMPLANT
SPACER ACDF TPS LG PARALLEL 7 (Spacer) ×3 IMPLANT
SPONGE INTESTINAL PEANUT (DISPOSABLE) ×9 IMPLANT
SPONGE SURGIFOAM ABS GEL SZ50 (HEMOSTASIS) IMPLANT
STRIP CLOSURE SKIN 1/2X4 (GAUZE/BANDAGES/DRESSINGS) ×2 IMPLANT
SUT VIC AB 0 CT1 27 (SUTURE) ×3
SUT VIC AB 0 CT1 27XBRD ANTBC (SUTURE) ×1 IMPLANT
SUT VIC AB 3-0 SH 8-18 (SUTURE) ×3 IMPLANT
TAPE CLOTH SURG 4X10 WHT LF (GAUZE/BANDAGES/DRESSINGS) ×3 IMPLANT
TOWEL GREEN STERILE (TOWEL DISPOSABLE) ×3 IMPLANT
TOWEL GREEN STERILE FF (TOWEL DISPOSABLE) ×3 IMPLANT
WATER STERILE IRR 1000ML POUR (IV SOLUTION) ×3 IMPLANT

## 2020-05-26 NOTE — TOC CAGE-AID Note (Signed)
Transition of Care [facility]) - CAGE-AID Screening   Patient Details  Name: [name] MRN: [id number] Date of Birth: February 14, 1946  [facility] CM/SW Contact:    Emeterio Reeve, [place] Phone Number: 05/26/2020, 2:43 PM   Clinical Narrative:  CSW met with pt at bedside. CSW introduced self and explained her role at the hospital. Pt reports she drinks 1-2 bottles of wine a night after her husband goes to bed. Pt reports her husband is aware of her alcohol use. Pt reports that she has talked to her primary provider about treatment and that she is receiving medication to help her stop drinking. Pt reports she has not yet decreased her alcohol intake. Pt denied substance use. Pt was receptive to resources and educational resources.   CAGE-AID Screening:    Have You Ever Felt You Ought to Cut Down on Your Drinking or Drug Use?: Yes Have People Annoyed You By Critizing Your Drinking Or Drug Use?: Yes Have You Felt Bad Or Guilty About Your Drinking Or Drug Use?: Yes Have You Ever Had a Drink or Used Drugs First Thing In The Morning to STeady Your Nerves or to Get Rid of a Hangover?: No CAGE-AID Score: 3  Substance Abuse Education Offered: Yes  Substance abuse interventions: Patient Counseling, Educational Materials  Emeterio Reeve, Latanya Presser, [place] Social Worker [phone]

## 2020-05-26 NOTE — Care Management Obs Status (Signed)
MEDICARE OBSERVATION STATUS NOTIFICATION   Patient Details  Name: [name] MRN: [id number] Date of Birth: June 15, 1946   Medicare Observation Status Notification Given:  Yes    Kermit Balo, RN 05/26/2020, 4:52 PM

## 2020-05-26 NOTE — Care Management CC44 (Signed)
Condition Code 44 Documentation Completed  Patient Details  Name: Johann Gascoigne MRN: [id number] Date of Birth: 1946/07/26   Condition Code 44 given:  Yes Patient signature on Condition Code 44 notice:  Yes Documentation of 2 MD's agreement:  Yes Code 44 added to claim:  Yes    Kermit Balo, RN 05/26/2020, 4:52 PM

## 2020-05-26 NOTE — Anesthesia Procedure Notes (Signed)
Procedure Name: Intubation Date/Time: 05/26/2020 10:05 PM Performed by: Molli Hazard, CRNA Pre-anesthesia Checklist: Patient identified, Emergency Drugs available, Suction available and Patient being monitored Patient Re-evaluated:Patient Re-evaluated prior to induction Oxygen Delivery Method: Circle system utilized Preoxygenation: Pre-oxygenation with 100% oxygen Induction Type: IV induction and Rapid sequence Laryngoscope Size: Glidescope Grade View: Grade I Tube type: Oral Tube size: 7.5 mm Number of attempts: 1 Airway Equipment and Method: Stylet Placement Confirmation: ETT inserted through vocal cords under direct vision,  positive ETCO2 and breath sounds checked- equal and bilateral Secured at: 22 cm Tube secured with: Tape Dental Injury: Teeth and Oropharynx as per pre-operative assessment

## 2020-05-26 NOTE — Progress Notes (Signed)
PROGRESS NOTE    [name]  OHY:[id number] DOB: 22-May-1946 DOA: 05/24/2020 PCP: Mayer Masker, PA-C    Brief Narrative:  75 year old Caucasian female, retired Engineer, civil (consulting), with past medical history significant for hypertension, hyperlipidemia, hypothyroidism, chronic diastolic CHF, venostasis, alcohol use disorder and reported GI bleed for which EGD was said to have revealed bleeding ulcer that was "clipped ".  Patient is worried if the clip is MRI compatible.  Patient's history is quite suggestible.  Patient presented with left lower extremity pain that sounded close to radiculopathy.  Patient also reported what appeared to be radiculopathic pain from the neck area, radiating to the left upper extremity.  MRI of the brain is nonrevealing.  We will have a low threshold to proceed with several: Lumbosacral MRI if no contraindications.  Input from physical therapy and Occupational Therapy is appreciated.  CIR has been recommended.  Assessment & Plan:   Principal Problem:   Left leg weakness Active Problems:   Hypertension   Hypothyroidism   HLD (hyperlipidemia)   Alcohol use   Pressure injury of skin   Radiculopathy  Left lower extremity weakness/numbness/neck pain radiating to the left upper extremity: -As per patient's history, weakness and numbness appear to be from the knee down to the foot.  -No fever or complaints of low back pain. No saddle anesthesia or loss of bowel/bladder function.  -Head CT negative for acute stroke.  -MRI brain is nonrevealing.   -Neurology recommendation is appreciated: If MRI head is negative for stroke, patient will need EMG nerve conduction studies in 4 to 6 weeks. -Patient's history is suggestive below. -Patient also reports neck pain radiating to the left upper extremity. -Tolerated brain MRI without issues. Still having weakness of LLE and numbness. Also still complaining of neck pain with radiation down L arm. Cervical and lumbar MRI  ordered, pending  Alcohol use disorder:  -Patient reports drinking a bottle of wine daily. Currently not displaying any signs of withdrawal but is at high risk for withdrawal during this hospitalization. -Continue with CIWA protocol; Ativan as needed. Thiamine and folate.  Hypertension:  - controlled at this time.    Macrocytosis without anemia: Hemoglobin 13.3, MCV 109. Likely related to chronic alcohol abuse. -B12 is 1312. Folate is 22.6.    Hyperlipidemia -Continue Crestor  Hypothyroidism -Continue Synthroid  GERD -Continue PPI  Chronic diastolic CHF:  -Currently compensated.     DVT prophylaxis: Lovenox subq Code Status: Full Family Communication: Pt in room, family at bedside  Status is: Observation  The patient remains OBS appropriate and will d/c before 2 midnights.  Dispo: The patient is from: Home              Anticipated d/c is to: Home              Anticipated d/c date is: 1 day              Patient currently is not medically stable to d/c.       Consultants:     Procedures:     Antimicrobials: Anti-infectives (From admission, onward)   None       Subjective: Still complaining of LLE weakness, neck pain with radiation down L arm  Objective: Vitals:   05/26/20 0423 05/26/20 0809 05/26/20 1241 05/26/20 1549  BP: (!) 141/65 (!) 153/76 (!) 154/79 139/64  Pulse:  86 88 92  Resp:  18 20 20   Temp:  98.1 F (36.7 C) 98.2 F (36.8 C) 98.4 F (36.9 C)  TempSrc:  Oral Oral Oral  SpO2:  97% 98% 96%  Weight:      Height:        Intake/Output Summary (Last 24 hours) at 05/26/2020 1810 Last data filed at 05/26/2020 1100 Gross per 24 hour  Intake 560 ml  Output 400 ml  Net 160 ml   Filed Weights   05/24/20 1544  Weight: 102.1 kg    Examination:  General exam: Appears calm and comfortable  Respiratory system: Clear to auscultation. Respiratory effort normal. Cardiovascular system: S1 & S2 heard,  Regular Gastrointestinal system: Abdomen is nondistended, soft and nontender. No organomegaly or masses felt. Normal bowel sounds heard. Central nervous system: Alert and oriented. No focal neurological deficits. Extremities: Symmetric 5 x 5 power. Skin: No rashes, lesions Psychiatry: Judgement and insight appear normal. Mood & affect appropriate.   Data Reviewed: I have personally reviewed following labs and imaging studies  CBC: Recent Labs  Lab 05/24/20 1608  WBC 5.2  NEUTROABS 2.5  HGB 13.3  HCT 40.9  MCV 109.1*  PLT 226   Basic Metabolic Panel: Recent Labs  Lab 05/24/20 1608 05/25/20 0500  NA 133*  --   K 4.2  --   CL 98  --   CO2 26  --   GLUCOSE 103*  --   BUN 14  --   CREATININE 0.94  --   CALCIUM 9.0  --   MG  --  2.4  PHOS  --  3.8   GFR: Estimated Creatinine Clearance: 63.8 mL/min (by C-G formula based on SCr of 0.94 mg/dL). Liver Function Tests: Recent Labs  Lab 05/24/20 1608  AST 26  ALT 19  ALKPHOS 80  BILITOT 0.7  PROT 6.3*  ALBUMIN 3.4*   No results for input(s): LIPASE, AMYLASE in the last 168 hours. No results for input(s): AMMONIA in the last 168 hours. Coagulation Profile: Recent Labs  Lab 05/24/20 1608  [name] 1.1   Cardiac Enzymes: No results for input(s): [name], [name], CKMBINDEX, TROPONINI in the last 168 hours. BNP (last 3 results) No results for input(s): PROBNP in the last 8760 hours. HbA1C: No results for input(s): HGBA1C in the last 72 hours. CBG: No results for input(s): GLUCAP in the last 168 hours. Lipid Profile: No results for input(s): CHOL, HDL, LDLCALC, TRIG, CHOLHDL, LDLDIRECT in the last 72 hours. Thyroid Function Tests: No results for input(s): TSH, T4TOTAL, FREET4, T3FREE, THYROIDAB in the last 72 hours. Anemia Panel: Recent Labs    05/25/20 0500  VITAMINB12 1,312*  FOLATE 22.6   Sepsis Labs: No results for input(s): PROCALCITON, LATICACIDVEN in the last 168 hours.  Recent Results (from the past 240  hour(s))  SARS Coronavirus 2 by RT PCR (hospital order, performed in [facility] hospital lab) Nasopharyngeal     Status: None   Collection Time: 05/25/20  1:00 AM   Specimen: Nasopharyngeal  Result Value Ref Range Status   SARS Coronavirus 2 NEGATIVE NEGATIVE Final    Comment: (NOTE) SARS-CoV-2 target nucleic acids are NOT DETECTED.  The SARS-CoV-2 RNA is generally detectable in upper and lower respiratory specimens during the acute phase of infection. The lowest concentration of SARS-CoV-2 viral copies this assay can detect is 250 copies / mL. A negative result does not preclude SARS-CoV-2 infection and should not be used as the sole basis for treatment or other patient management decisions.  A negative result may occur with improper specimen collection / handling, submission of specimen other than nasopharyngeal swab, presence of viral  mutation(s) within the areas targeted by this assay, and inadequate number of viral copies (<250 copies / mL). A negative result must be combined with clinical observations, patient history, and epidemiological information.  Fact Sheet for Patients:   BoilerBrush.com.cy  Fact Sheet for Healthcare Providers: https://pope.com/  This test is not yet approved or  cleared by the [place] FDA and has been authorized for detection and/or diagnosis of SARS-CoV-2 by FDA under an Emergency Use Authorization (EUA).  This EUA will remain in effect (meaning this test can be used) for the duration of the COVID-19 declaration under Section 564(b)(1) of the Act, 21 U.S.C. section 360bbb-3(b)(1), unless the authorization is terminated or revoked sooner.  Performed at [facility], [address], [place], [place] [address]      Radiology Studies: MR BRAIN WO CONTRAST  Result Date: 05/25/2020 CLINICAL DATA:  Initial evaluation for acute left lower extremity weakness. EXAM: MRI HEAD WITHOUT CONTRAST  TECHNIQUE: Multiplanar, multiecho pulse sequences of the brain and surrounding structures were obtained without intravenous contrast. COMPARISON:  Prior head CT from 05/24/2020. FINDINGS: Brain: Generalized age-related cerebral atrophy. Minimal chronic microvascular ischemic changes for age. No abnormal foci of restricted diffusion to suggest acute or subacute ischemia. Gray-white matter differentiation maintained. No encephalomalacia to suggest chronic cortical infarction. No evidence for acute intracranial hemorrhage. Single punctate focus of susceptibility artifact noted at the left parietal region, consistent with a small chronic microhemorrhage, of doubtful significance in isolation. No mass lesion, midline shift or mass effect. No hydrocephalus or extra-axial fluid collection. Pituitary gland suprasellar region normal. Midline structures intact and normal. Vascular: Major intracranial vascular flow voids are maintained. Skull and upper cervical spine: Craniocervical junction within normal limits. Bone marrow signal intensity normal. Well-circumscribed subcentimeter T1 hyperintense lesion noted at the right parietal calvarium, nonspecific, but most likely benign and of doubtful significance. No scalp soft tissue abnormality. Sinuses/Orbits: Globes and orbital soft tissues within normal limits. Chronic right maxillary sinusitis noted. Paranasal sinuses are otherwise largely clear. No mastoid effusion. Inner ear structures grossly normal. Other: None. IMPRESSION: 1. Negative brain MRI for age, with no acute intracranial abnormality identified. 2. Chronic right maxillary sinusitis. Electronically Signed   By: Rise Mu M.D.   On: 05/25/2020 03:29    Scheduled Meds: . aspirin  324 mg Oral Once  . enoxaparin (LOVENOX) injection  40 mg Subcutaneous Q24H  . ferrous sulfate  325 mg Oral Q breakfast  . folic acid  1 mg Oral Daily  . gabapentin  1,200 mg Oral QHS  . gabapentin  1,500 mg Oral Daily  .  levothyroxine  125 mcg Oral Once per day on Tue Fri  . levothyroxine  150 mcg Oral Once per day on Sun Mon Wed Thu Sat  . pantoprazole  40 mg Oral Daily  . rosuvastatin  5 mg Oral Daily  . thiamine  100 mg Oral Daily   Or  . thiamine  100 mg Intravenous Daily   Continuous Infusions:   LOS: 1 day   Rickey Barbara, MD [facility] Pager On Amion  If 7PM-7AM, please contact night-coverage 05/26/2020, 6:10 PM

## 2020-05-26 NOTE — Consult Note (Signed)
Reason for Consult: Cervicalgia, cervical radiculopathy, monoplegia Referring Physician: Dr. Marikay Alar [name] is an 74 y.o. female.  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  She tells me that a week ago she began having increasing neck and bilateral arm pain.  She says that 6 days ago she began having an unsteady gait and difficulty walking.  5 days ago her left leg became very weak.  The patient was admitted to [facility] on 05/24/2020.  She was worked up with a brain MRI, lumbar MRI and cervical MRI.  This demonstrated a large herniated disc with spinal cord compression at C7-T1.  A neurosurgical consultation was requested.  I immediately saw the patient.  Presently the patient is alert and pleasant.  She tells me that her left leg has been weak for the last 5 days as above.  She tells me that she has not had Lovenox since yesterday which is confirmed by her nurse.  She has been refusing her prescribed chewable aspirin because she has a history of peptic ulcer disease.  She last ate approximately 1300 today.  She tells me she walks with a cane chronically.  Past Medical History:  Diagnosis Date  . Alcohol abuse 04/25/2015  . Allergy   . Anastomotic ulcer S/P gastric bypass 06/18/2016  . Arthritis   . Cataract   . Degenerative disc disease, cervical   . GAD (generalized anxiety disorder) 06/14/2016  . HTN (hypertension) 04/25/2015  . Hypertension   . Hypothyroidism   . 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 Substance abuse (HCC)    Alcohol abuse 2016  . UGIB (upper gastrointestinal bleed) 04/25/2015    Past Surgical History:  Procedure Laterality Date  . CHOLECYSTECTOMY    . COSMETIC SURGERY    . DIAGNOSTIC LAPAROSCOPY    . ESOPHAGOGASTRODUODENOSCOPY N/A 04/25/2015   Procedure: ESOPHAGOGASTRODUODENOSCOPY (EGD);  Surgeon: Beverley Fiedler, MD;  Location: [facility] ENDOSCOPY;  Service:  Endoscopy;  Laterality: N/A;  . FRACTURE SURGERY    . GASTRIC BYPASS    . NM MYOVIEW LTD  10/10/2017   Normal LV size and function EF 72%.  Medium sized mild severity defect in the apical septal apical lateral and apical wall suggestive of breast attenuation.  LOW RISK.  No ischemia or infarction.    . OPEN REDUCTION INTERNAL FIXATION (ORIF) DISTAL RADIAL FRACTURE Right 01/15/2013   Procedure: OPEN REDUCTION INTERNAL FIXATION (ORIF) Right DISTAL RADIUS FRACTURE;  Surgeon: Eldred Manges, MD;  Location: [facility] OR;  Service: Orthopedics;  Laterality: Right;  . TRANSTHORACIC ECHOCARDIOGRAM  10/04/2017   Mild LVH.  EF 60-65%.  Grade 2/moderate diastolic dysfunction.  Mild pulmonary hypertension.  No valve lesions    Family History  Problem Relation Age of Onset  . Heart disease Mother   . Stroke Mother 56  . Cancer Mother        skin and breast  . Hypertension Mother   . Breast cancer Mother 29  . COPD Father   . Cancer Father   . Diabetes Father   . Hypertension Father   . Stroke Maternal Grandmother   . Mental illness Maternal Grandmother   . Breast cancer Sister   . Cancer Cousin 67    Social History:  reports that she has never smoked. She has never used smokeless tobacco. She reports current alcohol use of about 28.0 standard drinks of alcohol per week.  She reports that she does not use drugs.  Allergies:  Allergies  Allergen Reactions  . Cocoa Anaphylaxis  . Red Dye Hives    Rash and Nausea diarrhea  . Lyrica [Pregabalin] Other (See Comments)    Abnormal bleeding  . Other Other (See Comments)    Berries : Rash  . Sulfa Antibiotics Rash    Medications:  I have reviewed the patient's current medications. Prior to Admission:  Medications Prior to Admission  Medication Sig Dispense Refill Last Dose  . acetaminophen (TYLENOL) 500 MG tablet Take 500 mg by mouth every 6 (six) hours as needed for moderate pain. Take 1 to 2 tablets daily   05/24/2020 at Unknown time  .  benazepril-hydrochlorthiazide (LOTENSIN HCT) 20-25 MG tablet TAKE 1 TABLET BY MOUTH DAILY. 90 tablet 1 05/22/2020  . carvedilol (COREG) 6.25 MG tablet TAKE 1 TABLET BY MOUTH 2 TIMES DAILY WITH A MEAL. (Patient taking differently: Take 6.25 mg by mouth 2 (two) times daily with a meal. ) 180 tablet 3 05/24/2020 at Unknown time  . Cholecalciferol (VITAMIN D3) 125 MCG (5000 UT) TABS Take 15,000 Units by mouth See admin instructions. Takes 16109 units 5 days a week and 20000 units on 2 days.   05/22/2020  . Cyanocobalamin (VITAMIN B 12 PO) Take 2,500 mg by mouth once a week.   05/21/2020  . cyclobenzaprine (FLEXERIL) 10 MG tablet TAKE 1 TABLET BY MOUTH 3 TIMES DAILY AS NEEDED FOR MUSCLE SPASMS. (Patient taking differently: Take 10 mg by mouth daily as needed for muscle spasms. Marland Kitchen) 30 tablet 0 05/24/2020 at Unknown time  . diphenhydrAMINE (BENADRYL) 25 MG tablet Take 25 mg by mouth at bedtime as needed for sleep.    05/24/2020 at Unknown time  . ferrous sulfate 325 (65 FE) MG tablet Take 325 mg by mouth daily with breakfast.   05/21/2020  . furosemide (LASIX) 40 MG tablet Take 1 tablet (40 mg total) by mouth daily. (Patient taking differently: Take 20-40 mg by mouth See admin instructions. Takes  twice a week and 20 mg on other days , depends on weight and water intake.) 90 tablet 1 05/21/2020  . gabapentin (NEURONTIN) 300 MG capsule Take 300 mg by mouth See admin instructions. Take 5 capsules in the morning and 4 capsules at bedtime.    05/24/2020 at Unknown time  . HYDROcodone-acetaminophen (NORCO) 10-325 MG tablet Take 0.5 tablets by mouth every 6 (six) hours as needed for moderate pain.    05/24/2020 at Unknown time  . levothyroxine (SYNTHROID) 125 MCG tablet Only take 1 tab Tues and Fridays (Patient taking differently: Take 125 mcg by mouth See admin instructions. Take 1 tablet by mouth on Tuesday and Fridays) 25 tablet 0 05/21/2020  . levothyroxine (SYNTHROID) 150 MCG tablet everyday except Tues and Friday- take  those days (Patient taking differently: Take 150 mcg by mouth See admin instructions. Takes everyday except Tues and Friday) 60 tablet 0 05/24/2020 at Unknown time  . Melatonin 5 MG CAPS Take 10 mg by mouth at bedtime.    05/23/2020 at Unknown time  . meloxicam (MOBIC) 15 MG tablet TAKE 1 TABLET BY MOUTH DAILY. (Patient taking differently: Take 15 mg by mouth daily. ) 90 tablet 1 05/24/2020 at Unknown time  . pantoprazole (PROTONIX) 40 MG tablet Take 1 tablet (40 mg total) by mouth daily. 90 tablet 0 05/24/2020 at Unknown time  . rosuvastatin (CRESTOR) 5 MG tablet Take 1 tablet (5 mg total) by mouth daily. 90 tablet 1  05/23/2020 at Unknown time  . traMADol (ULTRAM) 50 MG tablet TAKE 1 TABLET BY MOUTH 2 TIMES DAILY. MAXIMUM 6 TABLETS PER DAY AS DIRECTED BY PRESCRIBER (Patient taking differently: Take 50 mg by mouth daily as needed for moderate pain. ) 60 tablet 0 05/24/2020 at Unknown time  . cyanocobalamin (,VITAMIN B-12,) 1000 MCG/ML injection Inject 1 mL (1,000 mcg total) into the muscle every 21 ( twenty-one) days. (Patient not taking: Reported on 05/24/2020) 7 mL 1 Not Taking at Unknown time  . gabapentin (NEURONTIN) 300 MG capsule Take 4 capsules by mouth every morning, 3 capsules in the afternoon, and 4 capsules at night (Patient not taking: Reported on 05/24/2020) 990 capsule 0 Not Taking at Unknown time   Scheduled: . [START ON 05/27/2020] dexamethasone (DECADRON) injection  4 mg Intravenous Q6H  . ferrous sulfate  325 mg Oral Q breakfast  . folic acid  1 mg Oral Daily  . gabapentin  1,200 mg Oral QHS  . gabapentin  1,500 mg Oral Daily  . levothyroxine  125 mcg Oral Once per day on Tue Fri  . levothyroxine  150 mcg Oral Once per day on Sun Mon Wed Thu Sat  . pantoprazole  40 mg Oral Daily  . rosuvastatin  5 mg Oral Daily  . thiamine  100 mg Oral Daily   Or  . thiamine  100 mg Intravenous Daily   Continuous:  ZOX:[id number]:acetaminophen **OR** acetaminophen, ALPRAZolam, cyclobenzaprine, LORazepam  **OR** LORazepam, traMADol Anti-infectives (From admission, onward)   None       Results for orders placed or performed during the hospital encounter of 05/24/20 (from the past 48 hour(s))  SARS Coronavirus 2 by RT PCR (hospital order, performed in [facility] lab) Nasopharyngeal     Status: None   Collection Time: 05/25/20  1:00 AM   Specimen: Nasopharyngeal  Result Value Ref Range   SARS Coronavirus 2 NEGATIVE NEGATIVE    Comment: (NOTE) SARS-CoV-2 target nucleic acids are NOT DETECTED.  The SARS-CoV-2 RNA is generally detectable in upper and lower respiratory specimens during the acute phase of infection. The lowest concentration of SARS-CoV-2 viral copies this assay can detect is 250 copies / mL. A negative result does not preclude SARS-CoV-2 infection and should not be used as the sole basis for treatment or other patient management decisions.  A negative result may occur with improper specimen collection / handling, submission of specimen other than nasopharyngeal swab, presence of viral mutation(s) within the areas targeted by this assay, and inadequate number of viral copies (<250 copies / mL). A negative result must be combined with clinical observations, patient history, and epidemiological information.  Fact Sheet for Patients:   BoilerBrush.com.cyhttps://www.fda.gov/media/136312/download  Fact Sheet for Healthcare Providers: https://pope.com/https://www.fda.gov/media/136313/download  This test is not yet approved or  cleared by the [place] FDA and has been authorized for detection and/or diagnosis of SARS-CoV-2 by FDA under an Emergency Use Authorization (EUA).  This EUA will remain in effect (meaning this test can be used) for the duration of the COVID-19 declaration under Section 564(b)(1) of the Act, 21 U.S.C. section 360bbb-3(b)(1), unless the authorization is terminated or revoked sooner.  Performed at [facility], [address] [place], [place] [id number]    Urinalysis, Routine w reflex microscopic     Status: Abnormal   Collection Time: 05/25/20  1:06 AM  Result Value Ref Range   Color, Urine YELLOW YELLOW   APPearance CLEAR CLEAR   Specific Gravity, Urine 1.005 1.005 - 1.030  pH 6.0 5.0 - 8.0   Glucose, UA NEGATIVE NEGATIVE mg/dL   Hgb urine dipstick SMALL (A) NEGATIVE   Bilirubin Urine NEGATIVE NEGATIVE   Ketones, ur NEGATIVE NEGATIVE mg/dL   Protein, ur NEGATIVE NEGATIVE mg/dL   Nitrite NEGATIVE NEGATIVE   Leukocytes,Ua NEGATIVE NEGATIVE   RBC / HPF 0-5 0 - 5 RBC/hpf   Bacteria, UA RARE (A) NONE SEEN   Squamous Epithelial / LPF 0-5 0 - 5    Comment: Performed at [facility], [address] [place], [place] [address]  Magnesium     Status: None   Collection Time: 05/25/20  5:00 AM  Result Value Ref Range   Magnesium 2.4 1.7 - 2.4 mg/dL    Comment: Performed at [facility], [address] [place], [place] [address]  Phosphorus     Status: None   Collection Time: 05/25/20  5:00 AM  Result Value Ref Range   Phosphorus 3.8 2.5 - 4.6 mg/dL    Comment: Performed at [facility], [address] [place], [place] [address]  Vitamin B12     Status: Abnormal   Collection Time: 05/25/20  5:00 AM  Result Value Ref Range   Vitamin B-12 1,312 (H) 180 - 914 pg/mL    Comment: (NOTE) This assay is not validated for testing neonatal or myeloproliferative syndrome specimens for Vitamin B12 levels. Performed at [facility], [address] [place], [place] [address]   Folate, serum, performed at [facility] lab     Status: None   Collection Time: 05/25/20  5:00 AM  Result Value Ref Range   Folate 22.6 >5.9 ng/mL    Comment: Performed at [facility], [address] [place], [place] [address]    MR BRAIN WO CONTRAST  Result Date: 05/25/2020 CLINICAL DATA:  Initial evaluation for acute left lower extremity weakness. EXAM: MRI HEAD WITHOUT CONTRAST TECHNIQUE: Multiplanar, multiecho pulse sequences of the  brain and surrounding structures were obtained without intravenous contrast. COMPARISON:  Prior head CT from 05/24/2020. FINDINGS: Brain: Generalized age-related cerebral atrophy. Minimal chronic microvascular ischemic changes for age. No abnormal foci of restricted diffusion to suggest acute or subacute ischemia. Gray-white matter differentiation maintained. No encephalomalacia to suggest chronic cortical infarction. No evidence for acute intracranial hemorrhage. Single punctate focus of susceptibility artifact noted at the left parietal region, consistent with a small chronic microhemorrhage, of doubtful significance in isolation. No mass lesion, midline shift or mass effect. No hydrocephalus or extra-axial fluid collection. Pituitary gland suprasellar region normal. Midline structures intact and normal. Vascular: Major intracranial vascular flow voids are maintained. Skull and upper cervical spine: Craniocervical junction within normal limits. Bone marrow signal intensity normal. Well-circumscribed subcentimeter T1 hyperintense lesion noted at the right parietal calvarium, nonspecific, but most likely benign and of doubtful significance. No scalp soft tissue abnormality. Sinuses/Orbits: Globes and orbital soft tissues within normal limits. Chronic right maxillary sinusitis noted. Paranasal sinuses are otherwise largely clear. No mastoid effusion. Inner ear structures grossly normal. Other: None. IMPRESSION: 1. Negative brain MRI for age, with no acute intracranial abnormality identified. 2. Chronic right maxillary sinusitis. Electronically Signed   By: Rise Mu M.D.   On: 05/25/2020 03:29   MR CERVICAL SPINE WO CONTRAST  Addendum Date: 05/26/2020   ADDENDUM REPORT: 05/26/2020 19:25 ADDENDUM: These results were called by telephone at the time of interpretation on 05/26/2020 at 7:24 pm to provider [name] , who verbally acknowledged these results. Electronically Signed  By: Marlan Palau M.D.   On:  05/26/2020 19:25   Result Date: 05/26/2020 CLINICAL DATA:  Cervical radiculopathy no red flags EXAM: MRI CERVICAL SPINE WITHOUT CONTRAST TECHNIQUE: Multiplanar, multisequence MR imaging of the cervical spine was performed. No intravenous contrast was administered. COMPARISON:  CT cervical spine 12/04/2018 FINDINGS: Alignment: Mild anterolisthesis C4-5.  2 mm anterolisthesis C7-T1. Vertebrae: Negative for fracture or mass. Cord: Cord compression at C7-T1 with cord hyperintensity which is ill-defined. This may be acute. Remainder of the cord signal is normal. Posterior Fossa, vertebral arteries, paraspinal tissues: Negative Disc levels: C2-3: Negative C3-4: Bilateral facet degeneration and mild disc degeneration. Mild foraminal stenosis bilaterally due to spurring. C4-5: Moderate facet degeneration bilaterally. Disc degeneration and spurring. Moderate foraminal encroachment bilaterally due to spurring. C5-6: Bilateral facet degeneration. Disc degeneration and diffuse uncinate spurring. Severe right foraminal encroachment and moderate to severe left foraminal encroachment due to spurring. Mild spinal stenosis. C6-7: Disc degeneration with diffuse uncinate spurring. Bilateral facet hypertrophy. Mild spinal stenosis and moderate foraminal stenosis bilaterally. C7-T1: Large extruded disc fragment to the left of midline. There is cord compression and cord hyperintensity. There is bilateral facet hypertrophy and mild foraminal stenosis bilaterally. IMPRESSION: Multilevel degenerative change. Disc and facet degeneration and spurring causing foraminal encroachment at multiple levels as above. Large extruded disc fragment to the left of midline at C7-T1. There is cord compression and ill-defined cord hyperintensity which may be acute. Electronically Signed: By: Marlan Palau M.D. On: 05/26/2020 19:15   MR LUMBAR SPINE WO CONTRAST  Result Date: 05/26/2020 CLINICAL DATA:  Low back pain.  Progressive neurologic deficit.  EXAM: MRI LUMBAR SPINE WITHOUT CONTRAST TECHNIQUE: Multiplanar, multisequence MR imaging of the lumbar spine was performed. No intravenous contrast was administered. COMPARISON:  None. FINDINGS: Segmentation:  Normal Alignment:  Mild anterolisthesis L3-4 L4-5 Vertebrae:  Normal bone marrow.  Negative for fracture or mass. Conus medullaris and cauda equina: Conus extends to the L2 level. Conus and cauda equina appear normal. Paraspinal and other soft tissues: Negative for paraspinous mass or adenopathy. No soft tissue edema or fluid collection. Diffuse muscular atrophy. Disc levels: L1-2: Broad-based central disc protrusion and mild facet degeneration. Negative for stenosis L2-3: Mild disc bulging and moderate facet hypertrophy bilaterally. Mild subarticular stenosis bilaterally L3-4: Disc degeneration with diffuse bulging of the disc. Moderate to advanced facet hypertrophy bilaterally. Mild spinal stenosis and mild subarticular stenosis bilaterally. L4-5: Small right foraminal disc protrusion displacing the right L4 nerve root. Severe facet degeneration. Moderate spinal stenosis and moderate subarticular stenosis bilaterally. L5-S1: Small central disc protrusion and mild facet degeneration. Negative for spinal or foraminal stenosis. IMPRESSION: Multilevel degenerative change throughout the lumbar spine. Negative for lumbar fracture Mild spinal stenosis L3-4 with mild subarticular stenosis bilaterally. Moderate spinal stenosis at L4-5 with moderate subarticular stenosis bilaterally. Electronically Signed   By: Marlan Palau M.D.   On: 05/26/2020 18:58    ROS Blood pressure (!) 165/81, pulse 90, temperature (!) 97.5 F (36.4 C), temperature source Oral, resp. rate 20, height 5' 5.5" (1.664 m), weight 102.1 kg, SpO2 96 %. Estimated body mass index is 36.87 kg/m as calculated from the following:   Height as of this encounter: 5' 5.5" (1.664 m).   Weight as of this encounter: 102.1 kg.  Physical  Exam  General: An obese and pleasant 74 year old white female in no apparent distress.  HEENT: Normocephalic, atraumatic, extraocular muscles are intact  Neck: Obese with a limited cervical range of motion.  Spurling's testing is positive on  the left.  Thorax: Symmetric  Abdomen: Obese  Extremities: Unremarkable  Neurologic exam: The patient is alert and oriented x3.  Cranial nerves II through XII were examined bilaterally and grossly normal except for bilateral mild presbycusis.  The patient's motor strength is 5/5 in her bilateral bicep, tricep, right iliopsoas, quadricep, gastrocnemius and dorsiflexors.  She has weakness in her bilateral handgrip at approximately 4/5.  Her left iliopsoas, quadricep and dorsiflexor strength is approximately 1-2 over 5.  Her left gastrocnemius strength is 2/5.  Sensation is intact to light touch sensation all tested dermatomes bilaterally.  Cerebellar function is intact to rapid alternating movements of the upper extremities bilaterally.  She has nonsustained ankle clonus in her left ankle.  I have reviewed the patient's lumbar MRI performed at [facility] today.  It demonstrates some mild diffuse degenerative changes without significant neural compression.  I have also reviewed the patient's cervical MRI performed at [facility] today.  She has diffuse degenerative changes.  There is a large left-sided herniated disc at C7-T1 with compression of the spinal cord and spinal cord signal change.    Assessment/Plan: C7-T1 herniated disc, cervical radiculopathy, cervicalgia, left lower extremity monoplegia/paresis: I have discussed the situation with the patient.  We have discussed the various treatment options including doing nothing, continue medical management, and surgery.  I have recommended the surgical option of a C7-T1 anterior cervical discectomy, fusion and plating to decompress her spinal cord.  I have described the surgery to her.  We  have discussed the risks of surgery including risks of anesthesia, hemorrhage, infection, injury to the various neck structures, failure to relieve her symptoms, worsening symptoms, fusion failure, medical risk, etc.  I have discussed the expected postoperative course and told her I think she would likely need to go to rehab.  I have answered all her questions.  She has decided to proceed with surgery.  We will get this done as soon as possible.  I have already posted the case.  We are awaiting an OR room.  Cristi Loron 05/26/2020, 8:25 PM

## 2020-05-26 NOTE — Anesthesia Preprocedure Evaluation (Addendum)
Anesthesia Evaluation  Patient identified by MRN, date of birth, ID band Patient awake    Reviewed: Allergy & Precautions, NPO status , Patient's Chart, lab work & pertinent test results  History of Anesthesia Complications Negative for: history of anesthetic complications  Airway Mallampati: II  TM Distance: >3 FB Neck ROM: Limited    Dental  (+) Lower Dentures, Upper Dentures, Dental Advisory Given   Pulmonary neg pulmonary ROS,    Pulmonary exam normal        Cardiovascular hypertension, Pt. on home beta blockers and Pt. on medications Normal cardiovascular exam   '18 Myoperfusion - Nuclear stress EF: 72%. The left ventricular ejection fraction is hyperdynamic (>65%). Defect 1: There is a medium defect of mild severity present in the apical septal, apical lateral and apex location. This defect is most consistent with artifact/chest wall attenuation This is a low risk study. There is no evidence of ischemia or infarction. The study is normal.  '18 TTE - mild LVH. EF 60% to 65%. Grade 2 diastolic dysfunction. Trivial AI and MR. LA was mildly dilated. PA peak pressure: 40 mm Hg     Neuro/Psych PSYCHIATRIC DISORDERS Anxiety  Cervical disc herniation with cord compression resulting in mild bilateral upper extremity weakness and significant lower extremity weakness per neurosurgical note     GI/Hepatic PUD, (+)     substance abuse  alcohol use,  S/p gastric bypass    Endo/Other  Hypothyroidism  Obesity   Renal/GU  Congenital kidney defect, unspecified, with essentially one functioning kidney      Musculoskeletal  (+) Arthritis , narcotic dependent  Abdominal   Peds  Hematology negative hematology ROS (+)   Anesthesia Other Findings Covid test negative   Reproductive/Obstetrics                            Anesthesia Physical Anesthesia Plan  ASA: III and emergent  Anesthesia  Plan: General   Post-op Pain Management:    Induction: Intravenous  PONV Risk Score and Plan: 4 or greater and Treatment may vary due to age or medical condition, Ondansetron and Dexamethasone  Airway Management Planned: Oral ETT and Video Laryngoscope Planned  Additional Equipment: None  Intra-op Plan:   Post-operative Plan: Extubation in OR  Informed Consent: I have reviewed the patients History and Physical, chart, labs and discussed the procedure including the risks, benefits and alternatives for the proposed anesthesia with the patient or authorized representative who has indicated his/her understanding and acceptance.     Dental advisory given  Plan Discussed with: CRNA and Anesthesiologist  Anesthesia Plan Comments:        Anesthesia Quick Evaluation

## 2020-05-26 NOTE — Progress Notes (Signed)
Physical Therapy Treatment Note  Patient seen for mobility progression. Pt able to stand with min A +2 and SPT to recliner with RW and mod A +2. Pt lacks active L dorsiflexion and unable to lift L LE in standing. Pt continues to c/o pain at medial aspect of L scapula and L calf with tactile input. Pt noted to have edematous L LE. PT will continue to follow acutely and progress as tolerated.    05/26/20 1712  PT Visit Information  Last PT Received On 05/26/20  Assistance Needed +2  PT/OT/SLP Co-Evaluation/Treatment Yes  Reason for Co-Treatment Complexity of the patient's impairments (multi-system involvement);For patient/therapist safety;To address functional/ADL transfers  PT goals addressed during session Mobility/safety with mobility  History of Present Illness 74 yo female admitted with L leg weakness and numb from knee down.  MRI of head negative. PMH HTN HLD CHF venous statis alcohol use disorder  Precautions  Precautions Fall  Precaution Comments reports 1 fall in last 6 months getting out of car at dentist office and reports "i was just spinning even though i wasn't"  pt reports more concern due to increased instability with basic transfers recently  Restrictions  Weight Bearing Restrictions No  Pain Assessment  Pain Assessment Faces  Faces Pain Scale 6  Pain Location medial aspect of L scapula and L calf with tactile input  Pain Descriptors / Indicators Discomfort;Grimacing;Guarding;Sharp  Pain Intervention(s) Limited activity within patient's tolerance;Monitored during session;Repositioned  Cognition  Arousal/Alertness Awake/alert  Behavior During Therapy WFL for tasks assessed/performed  Overall Cognitive Status Within Functional Limits for tasks assessed  Bed Mobility  Overal bed mobility Needs Assistance  Bed Mobility Rolling;Sidelying to Sit  Sidelying to sit Min assist;+2 for physical assistance  General bed mobility comments assist to elevate trunk into sitting; use of  rail rolling toward R side; increased time and effort to bring L LE to EOB and to scoot hips forward due to pain  Transfers  Overall transfer level Needs assistance  Equipment used Rolling walker (2 wheeled)  Transfers Sit to/from [employer] Transfers  Sit to Stand +2 physical assistance;Min assist;+2 safety/equipment  Stand pivot transfers Mod assist;+2 physical assistance;+2 safety/equipment  General transfer comment assist to power up and to steady while transitioning from sitting to standing and then for balance/weight shifting and managing RW to pivot bed to recliner; increased time and effort for trunk extension and cues for sequencing and technique to pivot as pt lacks active L LE flexion  Balance  Overall balance assessment Needs assistance  Sitting-balance support Bilateral upper extremity supported;Feet supported;Single extremity supported  Sitting balance-Leahy Scale Fair  Standing balance support Bilateral upper extremity supported;During functional activity  Standing balance-Leahy Scale Poor  PT - End of Session  Equipment Utilized During Treatment Gait belt  Activity Tolerance Patient tolerated treatment well  Patient left with call bell/phone within reach;in chair;with family/visitor present;with chair alarm set  Nurse Communication Mobility status   PT - Assessment/Plan  PT Plan Current plan remains appropriate  PT Visit Diagnosis Unsteadiness on feet (R26.81);Muscle weakness (generalized) (M62.81);Difficulty in walking, not elsewhere classified (R26.2)  PT Frequency (ACUTE ONLY) Min 3X/week  Follow Up Recommendations CIR  PT equipment Wheelchair cushion (measurements PT);Wheelchair (measurements PT)  AM-PAC PT "6 Clicks" Mobility Outcome Measure (Version 2)  Help needed turning from your back to your side while in a flat bed without using bedrails? 2  Help needed moving from lying on your back to sitting on the side of a flat bed without  using bedrails? 2  Help  needed moving to and from a bed to a chair (including a wheelchair)? 2  Help needed standing up from a chair using your arms (e.g., wheelchair or bedside chair)? 2  Help needed to walk in hospital room? 2  Help needed climbing 3-5 steps with a railing?  1  6 Click Score 11  Consider Recommendation of Discharge To: CIR/SNF/LTACH  PT Goal Progression  Progress towards PT goals Progressing toward goals  PT Time Calculation  PT Start Time (ACUTE ONLY) 1400  PT Stop Time (ACUTE ONLY) 1439  PT Time Calculation (min) (ACUTE ONLY) 39 min  PT General Charges  $$ ACUTE PT VISIT 1 Visit  PT Treatments  $Gait Training 8-22 mins  $Therapeutic Activity 8-22 mins   Erline Levine, PTA Acute Rehabilitation Services Pager: [phone] Office: [phone]

## 2020-05-26 NOTE — Progress Notes (Signed)
Inpatient Rehabilitation Admissions Coordinator  Inpatient rehab consult received. I met with patient at bedside for rehab assessment. Patient currently does not have a definitve diagnosis amenable to a possible CIR admit. Patient also states preference for direct d/c home with [facility] and husband's support. I will follow as workup ongoing.  Danne Baxter, RN, MSN Rehab Admissions Coordinator [phone] 05/26/2020 11:23 AM

## 2020-05-26 NOTE — Progress Notes (Signed)
Floor coverage  MRI C-spine showing large extruded disc fragment to the left of midline at C7-T1 with associated cord compression.  -Decadron has been ordered -I spoke to Dr. Lovell Sheehan, neurosurgery will consult.  Appreciate recommendations. -Patient has been made n.p.o. -She is on aspirin which is being held -On Lovenox for DVT prophylaxis which is being held

## 2020-05-26 NOTE — Progress Notes (Signed)
Occupational Therapy Treatment Patient Details Name: [name] MRN: [id number] DOB: Mar 20, 1946 Today's Date: 05/26/2020    History of present illness 74 yo female admitted with L leg weakness and numb from knee down.  MRI of head negative. PMH HTN HLD CHF venous statis alcohol use disorder   OT comments  Pt seen in conjunction with PT to maximize participation and activity tolerance. Pt able to progress OOB to recliner this session with MOD A +2 however pt unable to fully lift LLE needing cues to complete heel toe pattern to pivot to recliner. Pt continues to report pain in LLE needing to elevate LLE to decrease pain. Continue to recommend CIR for DC. IF decides against CIR pt will need DME listed below. Will follow for OT needs.   Follow Up Recommendations  CIR    Equipment Recommendations  3 in 1 bedside commode;Wheelchair (measurements OT);Wheelchair cushion (measurements OT)    Recommendations for Other Services Rehab consult    Precautions / Restrictions Precautions Precautions: Fall Precaution Comments: reports 1 fall in last 6 months getting out of car at dentist office and reports "i was just spinning even though i wasn't"  pt reports more concern due to increased instability with basic transfers recently Restrictions Weight Bearing Restrictions: No       Mobility Bed Mobility Overal bed mobility: Needs Assistance Bed Mobility: Rolling;Sidelying to Sit   Sidelying to sit: Min assist;+2 for physical assistance Supine to sit: Min assist Sit to supine: (P) +2 for physical assistance;Max assist   General bed mobility comments: pt able to roll to pts R side with bed rail, increased time and effort to maneuver BLES to EOB. light MIN A +2 to elevate trunk  Transfers Overall transfer level: Needs assistance Equipment used: Rolling walker (2 wheeled) Transfers: Sit to/from [employer] Sit to Stand: Min assist;+2 physical assistance;+2  safety/equipment Stand pivot transfers: Mod assist;+2 physical assistance;+2 safety/equipment       General transfer comment: pt required MIN A +2 to power up into standing with increased time and effort. pt required increased time to elevate trunk with a labored effort. MOD A +2 to pivot to recliner with pt unable to lift LLE during transfer    Balance Overall balance assessment: Needs assistance Sitting-balance support: Bilateral upper extremity supported;Feet supported Sitting balance-Leahy Scale: Fair     Standing balance support: Bilateral upper extremity supported;During functional activity Standing balance-Leahy Scale: Poor Standing balance comment: reliant on BUE support                           ADL either performed or assessed with clinical judgement   ADL Overall ADL's : Needs assistance/impaired       Grooming Details (indicate cue type and reason): reports no issues with grooming takss                 Toilet Transfer: Moderate assistance;Stand-pivot;RW Toilet Transfer Details (indicate cue type and reason): pt required MOD A +2 to pivot to recliner unable to lift LLE during transfer         Functional mobility during ADLs: Moderate assistance;+2 for physical assistance (stand pivot only) General ADL Comments: LUE pain appears to be in rhomboid area     Vision       Perception     Praxis      Cognition Arousal/Alertness: Awake/alert Behavior During Therapy: WFL for tasks assessed/performed Overall Cognitive Status: Within Functional Limits for tasks assessed  Exercises Other Exercises Other Exercises: pt reports elevating LUE assists with pain mgmt   Shoulder Instructions       General Comments pts husband present during session    Pertinent Vitals/ Pain       Pain Assessment: Faces Faces Pain Scale: Hurts little more Pain Location: rhomboids; L calf Pain Descriptors  / Indicators: Discomfort;Grimacing;Guarding;Sharp Pain Intervention(s): Limited activity within patient's tolerance;Monitored during session;Repositioned  Home Living                                          Prior Functioning/Environment              Frequency  Min 2X/week        Progress Toward Goals  OT Goals(current goals can now be found in the care plan section)  Progress towards OT goals: Progressing toward goals  Acute Rehab OT Goals Patient Stated Goal: to be able to sit up for meals OT Goal Formulation: With patient Time For Goal Achievement: 06/08/20 Potential to Achieve Goals: Good  Plan Discharge plan remains appropriate;Frequency remains appropriate    Co-evaluation    PT/OT/SLP Co-Evaluation/Treatment: Yes Reason for Co-Treatment: For patient/therapist safety;To address functional/ADL transfers;Complexity of the patient's impairments (multi-system involvement) PT goals addressed during session: (P) Mobility/safety with mobility OT goals addressed during session: ADL's and self-care      AM-PAC OT "6 Clicks" Daily Activity     Outcome Measure   Help from another person eating meals?: None Help from another person taking care of personal grooming?: A Little Help from another person toileting, which includes using toliet, bedpan, or urinal?: A Lot Help from another person bathing (including washing, rinsing, drying)?: A Lot Help from another person to put on and taking off regular upper body clothing?: A Lot Help from another person to put on and taking off regular lower body clothing?: A Lot 6 Click Score: 15    End of Session Equipment Utilized During Treatment: Gait belt;Rolling walker  OT Visit Diagnosis: Unsteadiness on feet (R26.81);Muscle weakness (generalized) (M62.81);Repeated falls (R29.6);Pain Pain - Right/Left: Left Pain - part of body: Shoulder   Activity Tolerance Patient tolerated treatment well   Patient Left in  chair;with call bell/phone within reach;with chair alarm set   Nurse Communication Mobility status        Time: 1600-1640 OT Time Calculation (min): 40 min  Charges: OT General Charges $OT Visit: 1 Visit OT Treatments $Therapeutic Activity: 8-22 mins  Audery Amel., COTA/L Acute Rehabilitation Services [phone]    Angelina Pih 05/26/2020, 5:36 PM

## 2020-05-27 ENCOUNTER — Observation Stay (HOSPITAL_COMMUNITY): Payer: Medicare PPO

## 2020-05-27 DIAGNOSIS — M5003 Cervical disc disorder with myelopathy, cervicothoracic region: Secondary | ICD-10-CM | POA: Diagnosis present

## 2020-05-27 DIAGNOSIS — E46 Unspecified protein-calorie malnutrition: Secondary | ICD-10-CM | POA: Diagnosis not present

## 2020-05-27 DIAGNOSIS — K219 Gastro-esophageal reflux disease without esophagitis: Secondary | ICD-10-CM | POA: Diagnosis present

## 2020-05-27 DIAGNOSIS — Z7289 Other problems related to lifestyle: Secondary | ICD-10-CM | POA: Diagnosis not present

## 2020-05-27 DIAGNOSIS — F411 Generalized anxiety disorder: Secondary | ICD-10-CM | POA: Diagnosis present

## 2020-05-27 DIAGNOSIS — Z9884 Bariatric surgery status: Secondary | ICD-10-CM | POA: Diagnosis not present

## 2020-05-27 DIAGNOSIS — I5032 Chronic diastolic (congestive) heart failure: Secondary | ICD-10-CM | POA: Diagnosis present

## 2020-05-27 DIAGNOSIS — G8929 Other chronic pain: Secondary | ICD-10-CM | POA: Diagnosis present

## 2020-05-27 DIAGNOSIS — R400 Somnolence: Secondary | ICD-10-CM | POA: Diagnosis present

## 2020-05-27 DIAGNOSIS — G8918 Other acute postprocedural pain: Secondary | ICD-10-CM | POA: Diagnosis not present

## 2020-05-27 DIAGNOSIS — K5903 Drug induced constipation: Secondary | ICD-10-CM | POA: Diagnosis not present

## 2020-05-27 DIAGNOSIS — M5000 Cervical disc disorder with myelopathy, unspecified cervical region: Secondary | ICD-10-CM

## 2020-05-27 DIAGNOSIS — L89892 Pressure ulcer of other site, stage 2: Secondary | ICD-10-CM | POA: Diagnosis present

## 2020-05-27 DIAGNOSIS — Z6836 Body mass index (BMI) 36.0-36.9, adult: Secondary | ICD-10-CM | POA: Diagnosis not present

## 2020-05-27 DIAGNOSIS — G8314 Monoplegia of lower limb affecting left nondominant side: Secondary | ICD-10-CM | POA: Diagnosis present

## 2020-05-27 DIAGNOSIS — E782 Mixed hyperlipidemia: Secondary | ICD-10-CM | POA: Diagnosis not present

## 2020-05-27 DIAGNOSIS — M5013 Cervical disc disorder with radiculopathy, cervicothoracic region: Secondary | ICD-10-CM | POA: Diagnosis present

## 2020-05-27 DIAGNOSIS — E039 Hypothyroidism, unspecified: Secondary | ICD-10-CM | POA: Diagnosis present

## 2020-05-27 DIAGNOSIS — I10 Essential (primary) hypertension: Secondary | ICD-10-CM | POA: Diagnosis not present

## 2020-05-27 DIAGNOSIS — M4802 Spinal stenosis, cervical region: Secondary | ICD-10-CM | POA: Diagnosis present

## 2020-05-27 DIAGNOSIS — I110 Hypertensive heart disease with heart failure: Secondary | ICD-10-CM | POA: Diagnosis present

## 2020-05-27 DIAGNOSIS — I272 Pulmonary hypertension, unspecified: Secondary | ICD-10-CM | POA: Diagnosis present

## 2020-05-27 DIAGNOSIS — M4713 Other spondylosis with myelopathy, cervicothoracic region: Secondary | ICD-10-CM | POA: Diagnosis present

## 2020-05-27 DIAGNOSIS — F1010 Alcohol abuse, uncomplicated: Secondary | ICD-10-CM | POA: Diagnosis present

## 2020-05-27 DIAGNOSIS — Z79899 Other long term (current) drug therapy: Secondary | ICD-10-CM | POA: Diagnosis not present

## 2020-05-27 DIAGNOSIS — I82403 Acute embolism and thrombosis of unspecified deep veins of lower extremity, bilateral: Secondary | ICD-10-CM

## 2020-05-27 DIAGNOSIS — D7589 Other specified diseases of blood and blood-forming organs: Secondary | ICD-10-CM | POA: Diagnosis present

## 2020-05-27 DIAGNOSIS — E669 Obesity, unspecified: Secondary | ICD-10-CM | POA: Diagnosis present

## 2020-05-27 DIAGNOSIS — Z20822 Contact with and (suspected) exposure to covid-19: Secondary | ICD-10-CM | POA: Diagnosis present

## 2020-05-27 DIAGNOSIS — R2689 Other abnormalities of gait and mobility: Secondary | ICD-10-CM | POA: Diagnosis not present

## 2020-05-27 DIAGNOSIS — Z79890 Hormone replacement therapy (postmenopausal): Secondary | ICD-10-CM | POA: Diagnosis not present

## 2020-05-27 DIAGNOSIS — M5020 Other cervical disc displacement, unspecified cervical region: Secondary | ICD-10-CM

## 2020-05-27 DIAGNOSIS — E8809 Other disorders of plasma-protein metabolism, not elsewhere classified: Secondary | ICD-10-CM | POA: Diagnosis not present

## 2020-05-27 DIAGNOSIS — G9520 Unspecified cord compression: Secondary | ICD-10-CM | POA: Diagnosis not present

## 2020-05-27 DIAGNOSIS — Z981 Arthrodesis status: Secondary | ICD-10-CM | POA: Diagnosis not present

## 2020-05-27 DIAGNOSIS — E785 Hyperlipidemia, unspecified: Secondary | ICD-10-CM | POA: Diagnosis present

## 2020-05-27 DIAGNOSIS — R29898 Other symptoms and signs involving the musculoskeletal system: Secondary | ICD-10-CM | POA: Diagnosis not present

## 2020-05-27 HISTORY — DX: Cervical disc disorder with myelopathy, unspecified cervical region: M50.00

## 2020-05-27 HISTORY — DX: Acute embolism and thrombosis of unspecified deep veins of lower extremity, bilateral: I82.403

## 2020-05-27 IMAGING — CR DG CERVICAL SPINE 1V
1 series · 1 of 1 positions shown · non-contrast
Comparison: [DATE]

CLINICAL DATA: Cervical spine surgery

EXAM:
DG CERVICAL SPINE - 1 VIEW

[AP]
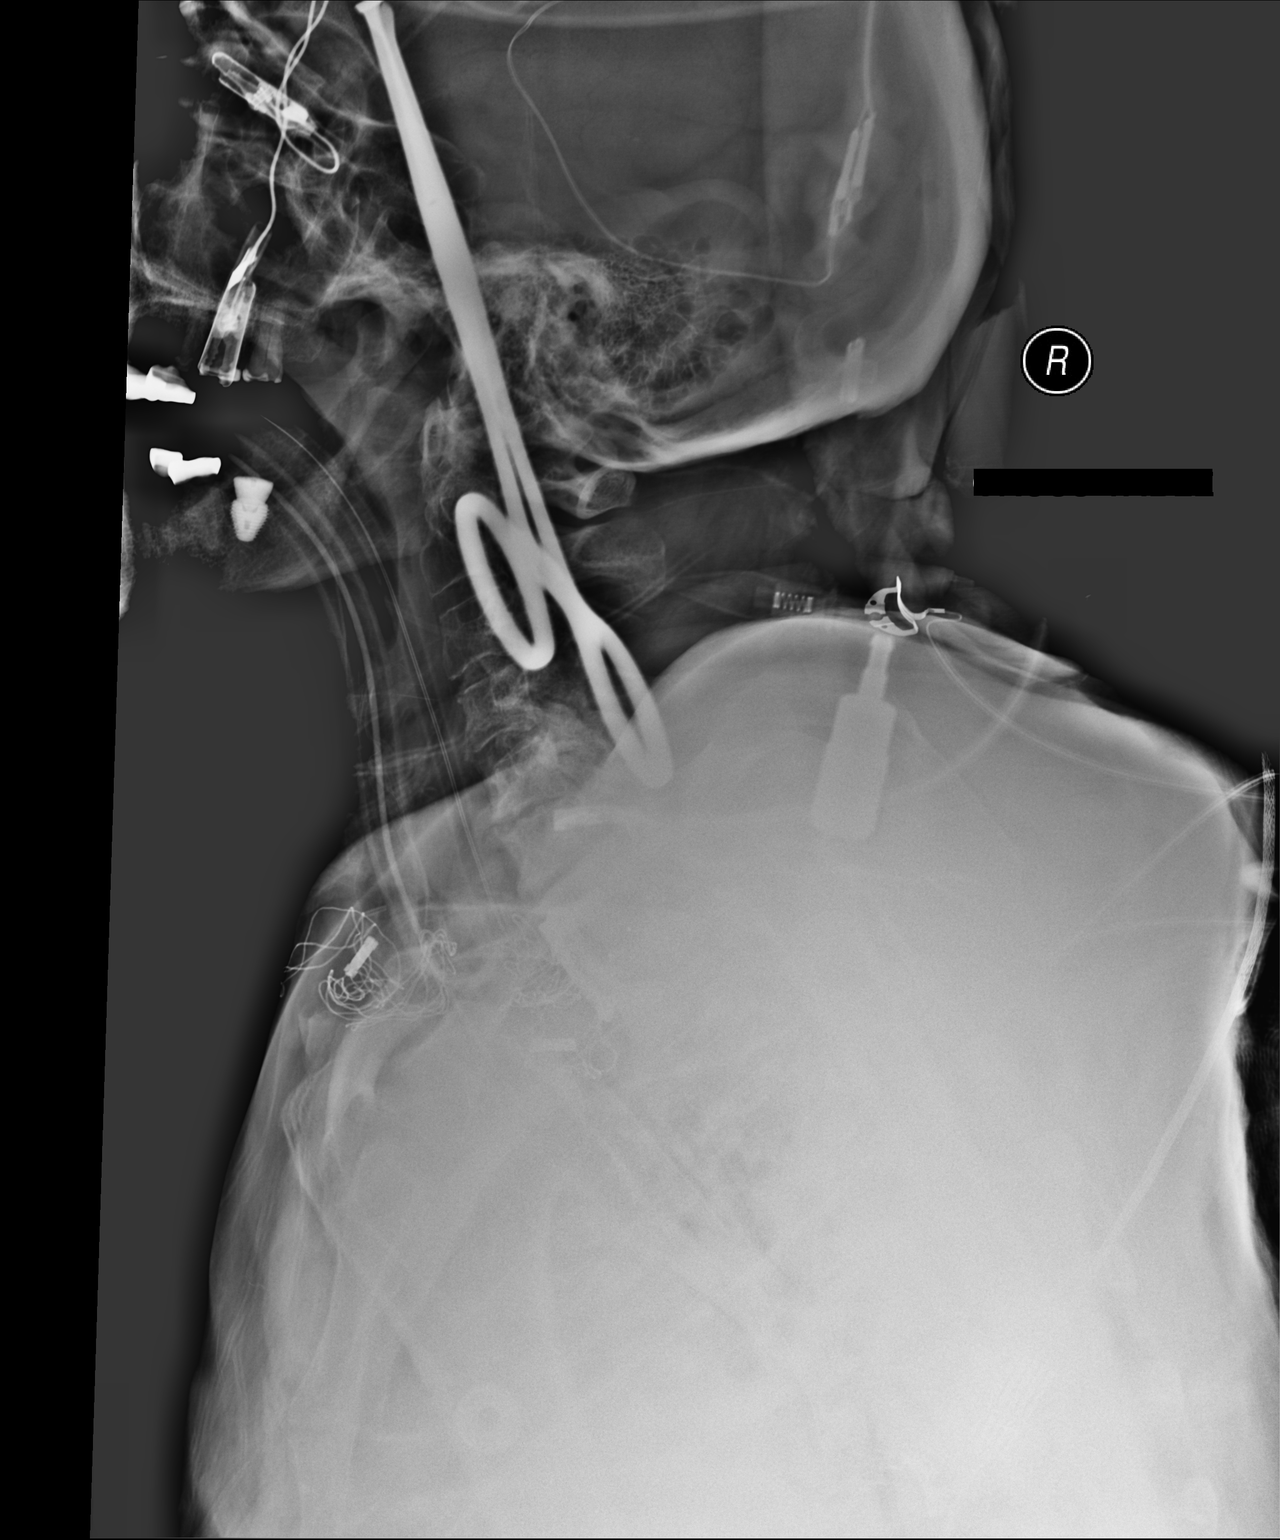

[1 of 1 positions shown; findings below may reference images not displayed]

FINDINGS: Patient is intubated. There are multiple metallic opacities that
project over the neck including what are suspected to be surgical
sponges anterior to the C7 level.
IMPRESSION: Multiple metallic opacities that project over the neck including
what are suspected to be surgical sponges anterior to the C7 level.

## 2020-05-27 MED ORDER — PHENOL 1.4 % MT LIQD: 1.0000 | OROMUCOSAL | Status: DC | PRN

## 2020-05-27 MED ORDER — PHENYLEPHRINE HCL-NACL 10-0.9 MG/250ML-% IV SOLN
INTRAVENOUS | Status: DC | PRN
Start: 2020-05-27 — End: 2020-05-27
  Administered 2020-05-27: 25 ug/min via INTRAVENOUS

## 2020-05-27 MED ORDER — DOCUSATE SODIUM 50 MG PO CAPS
100.0000 mg | ORAL_CAPSULE | Freq: Two times a day (BID) | ORAL | Status: DC
  Administered 2020-05-27 – 2020-05-29 (×5): 100 mg via ORAL
  Filled 2020-05-27 (×6): qty 2

## 2020-05-27 MED ORDER — BISACODYL 10 MG RE SUPP: 10.0000 mg | Freq: Every day | RECTAL | Status: DC | PRN

## 2020-05-27 MED ORDER — OXYCODONE HCL 5 MG PO TABS: 5.0000 mg | ORAL_TABLET | Freq: Once | ORAL | Status: DC | PRN

## 2020-05-27 MED ORDER — BENAZEPRIL HCL 20 MG PO TABS
20.0000 mg | ORAL_TABLET | Freq: Every day | ORAL | Status: DC
  Administered 2020-05-27 – 2020-05-29 (×3): 20 mg via ORAL
  Filled 2020-05-27 (×3): qty 1

## 2020-05-27 MED ORDER — FENTANYL CITRATE (PF) 100 MCG/2ML IJ SOLN
25.0000 ug | INTRAMUSCULAR | Status: DC | PRN
  Administered 2020-05-27: 25 ug via INTRAVENOUS

## 2020-05-27 MED ORDER — LACTATED RINGERS IV SOLN: INTRAVENOUS | Status: DC

## 2020-05-27 MED ORDER — PANTOPRAZOLE SODIUM 40 MG IV SOLR
40.0000 mg | Freq: Every day | INTRAVENOUS | Status: DC
  Administered 2020-05-27: 40 mg via INTRAVENOUS
  Filled 2020-05-27: qty 40

## 2020-05-27 MED ORDER — HYDROCODONE-ACETAMINOPHEN 10-325 MG PO TABS: 1.0000 | ORAL_TABLET | ORAL | Status: DC | PRN

## 2020-05-27 MED ORDER — ONDANSETRON HCL 4 MG/2ML IJ SOLN: 4.0000 mg | Freq: Once | INTRAMUSCULAR | Status: DC | PRN

## 2020-05-27 MED ORDER — OXYCODONE HCL 5 MG PO TABS
5.0000 mg | ORAL_TABLET | ORAL | Status: DC | PRN
  Administered 2020-05-27 – 2020-05-29 (×3): 5 mg via ORAL
  Filled 2020-05-27 (×3): qty 1

## 2020-05-27 MED ORDER — ACETAMINOPHEN 650 MG RE SUPP: 650.0000 mg | RECTAL | Status: DC | PRN

## 2020-05-27 MED ORDER — ACETAMINOPHEN 325 MG PO TABS
650.0000 mg | ORAL_TABLET | ORAL | Status: DC | PRN
  Administered 2020-05-28 – 2020-05-29 (×4): 650 mg via ORAL
  Filled 2020-05-27 (×4): qty 2

## 2020-05-27 MED ORDER — ALUM & MAG HYDROXIDE-SIMETH 200-200-20 MG/5ML PO SUSP: 30.0000 mL | Freq: Four times a day (QID) | ORAL | Status: DC | PRN

## 2020-05-27 MED ORDER — MORPHINE SULFATE (PF) 4 MG/ML IV SOLN
4.0000 mg | INTRAVENOUS | Status: DC | PRN
  Administered 2020-05-27 (×2): 4 mg via INTRAVENOUS
  Filled 2020-05-27 (×2): qty 1

## 2020-05-27 MED ORDER — CYCLOBENZAPRINE HCL 10 MG PO TABS
10.0000 mg | ORAL_TABLET | Freq: Three times a day (TID) | ORAL | Status: DC | PRN
  Administered 2020-05-27 – 2020-05-29 (×3): 10 mg via ORAL
  Filled 2020-05-27 (×3): qty 1

## 2020-05-27 MED ORDER — PANTOPRAZOLE SODIUM 40 MG IV SOLR
40.0000 mg | Freq: Every day | INTRAVENOUS | Status: DC
  Administered 2020-05-28: 40 mg via INTRAVENOUS
  Filled 2020-05-27: qty 40

## 2020-05-27 MED ORDER — ACETAMINOPHEN 500 MG PO TABS
1000.0000 mg | ORAL_TABLET | Freq: Four times a day (QID) | ORAL | Status: AC
  Administered 2020-05-27 – 2020-05-28 (×3): 1000 mg via ORAL
  Filled 2020-05-27 (×3): qty 2

## 2020-05-27 MED ORDER — BENAZEPRIL-HYDROCHLOROTHIAZIDE 20-25 MG PO TABS: 1.0000 | ORAL_TABLET | Freq: Every day | ORAL | Status: DC

## 2020-05-27 MED ORDER — OXYCODONE HCL 5 MG PO TABS
10.0000 mg | ORAL_TABLET | ORAL | Status: DC | PRN
  Administered 2020-05-27 – 2020-05-29 (×4): 10 mg via ORAL
  Filled 2020-05-27 (×4): qty 2

## 2020-05-27 MED ORDER — SUGAMMADEX SODIUM 200 MG/2ML IV SOLN
INTRAVENOUS | Status: DC | PRN
  Administered 2020-05-27: 250 mg via INTRAVENOUS

## 2020-05-27 MED ORDER — CEFAZOLIN SODIUM-DEXTROSE 2-4 GM/100ML-% IV SOLN
2.0000 g | Freq: Three times a day (TID) | INTRAVENOUS | Status: AC
  Administered 2020-05-27 (×2): 2 g via INTRAVENOUS
  Filled 2020-05-27 (×2): qty 100

## 2020-05-27 MED ORDER — ONDANSETRON HCL 4 MG/2ML IJ SOLN: 4.0000 mg | Freq: Four times a day (QID) | INTRAMUSCULAR | Status: DC | PRN

## 2020-05-27 MED ORDER — ZOLPIDEM TARTRATE 5 MG PO TABS: 5.0000 mg | ORAL_TABLET | Freq: Every evening | ORAL | Status: DC | PRN

## 2020-05-27 MED ORDER — ONDANSETRON HCL 4 MG PO TABS: 4.0000 mg | ORAL_TABLET | Freq: Four times a day (QID) | ORAL | Status: DC | PRN

## 2020-05-27 MED ORDER — HYDROCHLOROTHIAZIDE 25 MG PO TABS
25.0000 mg | ORAL_TABLET | Freq: Every day | ORAL | Status: DC
  Administered 2020-05-27 – 2020-05-29 (×3): 25 mg via ORAL
  Filled 2020-05-27 (×3): qty 1

## 2020-05-27 MED ORDER — ONDANSETRON HCL 4 MG/2ML IJ SOLN
INTRAMUSCULAR | Status: DC | PRN
  Administered 2020-05-27: 4 mg via INTRAVENOUS

## 2020-05-27 MED ORDER — LABETALOL HCL 5 MG/ML IV SOLN
INTRAVENOUS | Status: AC
  Filled 2020-05-27: qty 4

## 2020-05-27 MED ORDER — LABETALOL HCL 5 MG/ML IV SOLN
5.0000 mg | Freq: Once | INTRAVENOUS | Status: AC
  Administered 2020-05-27: 5 mg via INTRAVENOUS

## 2020-05-27 MED ORDER — SODIUM CHLORIDE 0.9 % IV SOLN
INTRAVENOUS | Status: DC | PRN
  Administered 2020-05-27 (×2): 250 mL via INTRAVENOUS

## 2020-05-27 MED ORDER — DEXAMETHASONE 4 MG PO TABS
4.0000 mg | ORAL_TABLET | Freq: Four times a day (QID) | ORAL | Status: AC
  Administered 2020-05-27: 4 mg via ORAL
  Filled 2020-05-27: qty 1

## 2020-05-27 MED ORDER — BACITRACIN ZINC 500 UNIT/GM EX OINT
TOPICAL_OINTMENT | CUTANEOUS | Status: DC | PRN
  Administered 2020-05-27: 1 via TOPICAL

## 2020-05-27 MED ORDER — MENTHOL 3 MG MT LOZG: 1.0000 | LOZENGE | OROMUCOSAL | Status: DC | PRN

## 2020-05-27 MED ORDER — OXYCODONE HCL 5 MG/5ML PO SOLN: 5.0000 mg | Freq: Once | ORAL | Status: DC | PRN

## 2020-05-27 MED ORDER — FENTANYL CITRATE (PF) 100 MCG/2ML IJ SOLN
INTRAMUSCULAR | Status: AC
  Filled 2020-05-27: qty 2

## 2020-05-27 MED ORDER — DEXAMETHASONE SODIUM PHOSPHATE 4 MG/ML IJ SOLN
4.0000 mg | Freq: Four times a day (QID) | INTRAMUSCULAR | Status: AC
  Administered 2020-05-27: 4 mg via INTRAVENOUS
  Filled 2020-05-27: qty 1

## 2020-05-27 MED ORDER — CARVEDILOL 6.25 MG PO TABS
6.2500 mg | ORAL_TABLET | Freq: Two times a day (BID) | ORAL | Status: DC
  Administered 2020-05-27 – 2020-05-29 (×5): 6.25 mg via ORAL
  Filled 2020-05-27 (×5): qty 1

## 2020-05-27 NOTE — Progress Notes (Signed)
Inpatient Rehabilitation Admissions Coordinator  Notified by Dr. Rhona Leavens of patient's herniated disc and surgery. I will follow up with her postoperative therapy and pursue insurance approval for possible CIR pending her willingness and insurance approval.  Ottie Glazier, RN, MSN Rehab Admissions Coordinator [phone] 05/27/2020 7:59 AM

## 2020-05-27 NOTE — Progress Notes (Signed)
Occupational Therapy Re-Evaluation Patient Details Name: [name] MRN: [id number] DOB: October 27, 1946 Today's Date: 05/27/2020    History of Present Illness 74 yo female admitted with L leg weakness and numb from knee down.  MRI of head negative. PMH HTN HLD CHF venous statis alcohol use disorder. Underwent C7-T1 anterior cervical discectomy/decompression 05/26/2020.    Clinical Impression   Pt underwent cervical fusion 6/30 and states she feels her L leg is stronger and no longer has the intense pain that she was feeling previously. PTA, pt modified independent with mobility and ADL. Required +2 Mod A with sit - stand and min A +2 with stand pivot transfers @ RW level due to L leg weakness. Requires Max A with LB ADL. Communicated with Hildred Priest NP who states it is OK for pt to remove brace when eating. Began education regarding cervical precautions. Given history of falls, LLE weakness, balance deficits and decreased level of independence with mobility and ADL, feel pt will benefit from rehab at CIR to maximize functional level of independence and facilitate safe DC home with spouse. Will continue to follow acutely.     Follow Up Recommendations  CIR;Supervision/Assistance - 24 hour    Equipment Recommendations  Other (comment) (TBA)    Recommendations for Other Services Rehab consult     Precautions / Restrictions Precautions Precautions: Fall;Cervical Precaution Booklet Issued: No      Mobility Bed Mobility               General bed mobility comments: sitting EOB with PT on arrival  Transfers Overall transfer level: Needs assistance   Transfers: Sit to/from Stand;Stand Pivot Transfers Sit to Stand: Mod assist;+2 physical assistance;From elevated surface Stand pivot transfers: Min assist;+2 physical assistance       General transfer comment: VC for correct hand placement and positioning in RW due to LLE weakness/hx of buckling    Balance     Sitting  balance-Leahy Scale: Fair       Standing balance-Leahy Scale: Poor                             ADL either performed or assessed with clinical judgement   ADL Overall ADL's : Needs assistance/impaired Eating/Feeding: Modified independent;Bed level   Grooming: Sitting;Minimal assistance Grooming Details (indicate cue type and reason): to reach hair Upper Body Bathing: Set up;Sitting   Lower Body Bathing: Moderate assistance;Sit to/from stand   Upper Body Dressing : Minimal assistance;Sitting Upper Body Dressing Details (indicate cue type and reason): A to donn/doff cervical collar Lower Body Dressing: Maximal assistance;Sit to/from stand   Toilet Transfer: Moderate assistance;+2 for physical assistance;Stand-pivot;BSC;RW Toilet Transfer Details (indicate cue type and reason): ablet o take several pivotal steps to [facility] Toileting- Clothing Manipulation and Hygiene: Total assistance;Sit to/from stand Toileting - Clothing Manipulation Details (indicate cue type and reason): unable to release RW for pericare     Functional mobility during ADLs: Rolling walker;Cueing for sequencing;Cueing for safety;+2 for physical assistance;Moderate assistance General ADL Comments: Pt normally bends forward tfor LB ADL; will need to use AE; unablet o complete figure four positioning     Vision Baseline Vision/History: Wears glasses       Perception     Praxis      Pertinent Vitals/Pain Pain Assessment: 0-10 Pain Score: 3  Pain Location: neck Pain Descriptors / Indicators: Discomfort;Sore Pain Intervention(s): Limited activity within patient's tolerance     Hand Dominance Right  Extremity/Trunk Assessment Upper Extremity Assessment Upper Extremity Assessment: Generalized weakness (but overall functional; no sensory issues/resolved per pt)   Lower Extremity Assessment Lower Extremity Assessment: Defer to PT evaluation   Cervical / Trunk Assessment Cervical / Trunk  Assessment: Kyphotic;Other exceptions (forward head)   Communication Communication Communication: No difficulties   Cognition Arousal/Alertness: Awake/alert Behavior During Therapy: WFL for tasks assessed/performed Overall Cognitive Status: Within Functional Limits for tasks assessed                                     General Comments  Began education on cervical precautions for ADL; pt enjoys cooking    Exercises Exercises: Other exercises Other Exercises Other Exercises: encouraged "alphabit exercises " with L ankle   Shoulder Instructions      Home Living Family/patient expects to be discharged to:: Private residence Living Arrangements: Spouse/significant other Available Help at Discharge: Family Type of Home: House Home Access: Stairs to enter Secretary/administrator of Steps: 2 Entrance Stairs-Rails: Right Home Layout: Two level;1/2 bath on main level;Bed/bath upstairs     Bathroom Shower/Tub: Producer, television/film/video: Standard Bathroom Accessibility: Yes How Accessible: Accessible via walker Home Equipment: Walker - 2 wheels;Cane - single point;Bedside commode          Prior Functioning/Environment Level of Independence: Needs assistance  Gait / Transfers Assistance Needed: walks with cane outside the home ADL's / Homemaking Assistance Needed: reports spouse (A) with stairs to go upstairs for bathing. "i do not bathe unless he is there to help me"    Comments: pt was driving and independent with self care        OT Problem List: Decreased strength;Decreased range of motion;Decreased activity tolerance;Impaired balance (sitting and/or standing);Decreased safety awareness;Decreased knowledge of use of DME or AE;Decreased knowledge of precautions;Impaired sensation;Obesity;Pain      OT Treatment/Interventions: Self-care/ADL training;Therapeutic exercise;Neuromuscular education;DME and/or AE instruction;Therapeutic  activities;Patient/family education;Balance training    OT Goals(Current goals can be found in the care plan section) Acute Rehab OT Goals Patient Stated Goal: to be independent again OT Goal Formulation: With patient Time For Goal Achievement: 06/10/20 Potential to Achieve Goals: Good  OT Frequency: Min 2X/week   Barriers to D/C:            Co-evaluation PT/OT/SLP Co-Evaluation/Treatment: Yes Reason for Co-Treatment: For patient/therapist safety;To address functional/ADL transfers   OT goals addressed during session: ADL's and self-care      AM-PAC OT "6 Clicks" Daily Activity     Outcome Measure Help from another person eating meals?: None Help from another person taking care of personal grooming?: A Little Help from another person toileting, which includes using toliet, bedpan, or urinal?: Total Help from another person bathing (including washing, rinsing, drying)?: A Lot Help from another person to put on and taking off regular upper body clothing?: A Little Help from another person to put on and taking off regular lower body clothing?: A Lot 6 Click Score: 15   End of Session Equipment Utilized During Treatment: Gait belt;Rolling walker Nurse Communication: Mobility status;Precautions  Activity Tolerance: Patient tolerated treatment well Patient left: in chair;with call bell/phone within reach  OT Visit Diagnosis: Unsteadiness on feet (R26.81);Other abnormalities of gait and mobility (R26.89);Muscle weakness (generalized) (M62.81);History of falling (Z91.81);Pain Pain - part of body:  (neck)                Time: 6546-5035 OT Time Calculation (  min): 30 min Charges:  OT General Charges $OT Visit: 1 Visit OT Evaluation $OT Re-eval: 1 Re-eval  Luisa Dago, OT/L   Acute OT Clinical Specialist Acute Rehabilitation Services Pager [phone] Office [phone]   [facility] 05/27/2020, 3:01 PM

## 2020-05-27 NOTE — Progress Notes (Signed)
Inpatient Rehabilitation Admissions Coordinator  I will begin insurance authorization for possible Cir admit once I have PT eval postoperatively. I will follow up tomorrow.  Ottie Glazier, RN, MSN Rehab Admissions Coordinator [phone] 05/27/2020 5:00 PM

## 2020-05-27 NOTE — Progress Notes (Signed)
Subjective: The patient is alert and pleasant.  She looks well.  She feels her leg is much stronger.  Her upper extremity radicular symptoms have resolved.  Objective: Vital signs in last 24 hours: Temp:  [97.5 F (36.4 C)-98.7 F (37.1 C)] 98.3 F (36.8 C) (07/01 0832) Pulse Rate:  [81-98] 94 (07/01 0832) Resp:  [14-20] 18 (07/01 0832) BP: (139-172)/(64-87) 170/85 (07/01 0832) SpO2:  [92 %-98 %] 96 % (07/01 0832) Estimated body mass index is 36.87 kg/m as calculated from the following:   Height as of this encounter: 5' 5.5" (1.664 m).   Weight as of this encounter: 102.1 kg.   Intake/Output from previous day: 06/30 0701 - 07/01 0700 In: 2197.5 [P.O.:560; I.V.:1637.5] Out: 1700 [Urine:1600; Blood:100] Intake/Output this shift: Total I/O In: -  Out: 900 [Urine:900]  Physical exam the patient is alert and oriented.  Her dressing is clean and dry.  There is no hematoma or shift.  Her left lower extremity strength has improved with approximately 3-4 over 5 gastrocnemius strength.  Lab Results: Recent Labs    05/24/20 1608 05/26/20 2103  WBC 5.2 5.6  HGB 13.3 12.8  HCT 40.9 38.5  PLT 226 215   BMET Recent Labs    05/24/20 1608 05/26/20 2103  NA 133* 136  K 4.2 4.0  CL 98 103  CO2 26 25  GLUCOSE 103* 119*  BUN 14 16  CREATININE 0.94 0.82  CALCIUM 9.0 8.8*    Studies/Results: DG Cervical Spine 1 View  Result Date: 05/27/2020 CLINICAL DATA:  Cervical spine surgery EXAM: DG CERVICAL SPINE - 1 VIEW COMPARISON:  05/26/2020 FINDINGS: Patient is intubated. There are multiple metallic opacities that project over the neck including what are suspected to be surgical sponges anterior to the C7 level. IMPRESSION: Multiple metallic opacities that project over the neck including what are suspected to be surgical sponges anterior to the C7 level. Electronically Signed   By: Deatra Robinson M.D.   On: 05/27/2020 01:15   DG Cervical Spine 1 View  Result Date: 05/26/2020 CLINICAL  DATA:  Surgery. EXAM: DG CERVICAL SPINE - 1 VIEW COMPARISON:  MRI earlier today. FINDINGS: Single lateral view of the cervical spine demonstrates surgical instrument localizing anteriorly at the level of C4-C5. IMPRESSION: Surgical instrument localizing anteriorly at the level of C4-C5. Electronically Signed   By: Narda Rutherford M.D.   On: 05/26/2020 23:06   MR CERVICAL SPINE WO CONTRAST  Addendum Date: 05/26/2020   ADDENDUM REPORT: 05/26/2020 19:25 ADDENDUM: These results were called by telephone at the time of interpretation on 05/26/2020 at 7:24 pm to provider Rathore , who verbally acknowledged these results. Electronically Signed   By: Marlan Palau M.D.   On: 05/26/2020 19:25   Result Date: 05/26/2020 CLINICAL DATA:  Cervical radiculopathy no red flags EXAM: MRI CERVICAL SPINE WITHOUT CONTRAST TECHNIQUE: Multiplanar, multisequence MR imaging of the cervical spine was performed. No intravenous contrast was administered. COMPARISON:  CT cervical spine 12/04/2018 FINDINGS: Alignment: Mild anterolisthesis C4-5.  2 mm anterolisthesis C7-T1. Vertebrae: Negative for fracture or mass. Cord: Cord compression at C7-T1 with cord hyperintensity which is ill-defined. This may be acute. Remainder of the cord signal is normal. Posterior Fossa, vertebral arteries, paraspinal tissues: Negative Disc levels: C2-3: Negative C3-4: Bilateral facet degeneration and mild disc degeneration. Mild foraminal stenosis bilaterally due to spurring. C4-5: Moderate facet degeneration bilaterally. Disc degeneration and spurring. Moderate foraminal encroachment bilaterally due to spurring. C5-6: Bilateral facet degeneration. Disc degeneration and diffuse uncinate spurring. Severe  right foraminal encroachment and moderate to severe left foraminal encroachment due to spurring. Mild spinal stenosis. C6-7: Disc degeneration with diffuse uncinate spurring. Bilateral facet hypertrophy. Mild spinal stenosis and moderate foraminal stenosis  bilaterally. C7-T1: Large extruded disc fragment to the left of midline. There is cord compression and cord hyperintensity. There is bilateral facet hypertrophy and mild foraminal stenosis bilaterally. IMPRESSION: Multilevel degenerative change. Disc and facet degeneration and spurring causing foraminal encroachment at multiple levels as above. Large extruded disc fragment to the left of midline at C7-T1. There is cord compression and ill-defined cord hyperintensity which may be acute. Electronically Signed: By: Marlan Palau M.D. On: 05/26/2020 19:15   MR LUMBAR SPINE WO CONTRAST  Result Date: 05/26/2020 CLINICAL DATA:  Low back pain.  Progressive neurologic deficit. EXAM: MRI LUMBAR SPINE WITHOUT CONTRAST TECHNIQUE: Multiplanar, multisequence MR imaging of the lumbar spine was performed. No intravenous contrast was administered. COMPARISON:  None. FINDINGS: Segmentation:  Normal Alignment:  Mild anterolisthesis L3-4 L4-5 Vertebrae:  Normal bone marrow.  Negative for fracture or mass. Conus medullaris and cauda equina: Conus extends to the L2 level. Conus and cauda equina appear normal. Paraspinal and other soft tissues: Negative for paraspinous mass or adenopathy. No soft tissue edema or fluid collection. Diffuse muscular atrophy. Disc levels: L1-2: Broad-based central disc protrusion and mild facet degeneration. Negative for stenosis L2-3: Mild disc bulging and moderate facet hypertrophy bilaterally. Mild subarticular stenosis bilaterally L3-4: Disc degeneration with diffuse bulging of the disc. Moderate to advanced facet hypertrophy bilaterally. Mild spinal stenosis and mild subarticular stenosis bilaterally. L4-5: Small right foraminal disc protrusion displacing the right L4 nerve root. Severe facet degeneration. Moderate spinal stenosis and moderate subarticular stenosis bilaterally. L5-S1: Small central disc protrusion and mild facet degeneration. Negative for spinal or foraminal stenosis. IMPRESSION:  Multilevel degenerative change throughout the lumbar spine. Negative for lumbar fracture Mild spinal stenosis L3-4 with mild subarticular stenosis bilaterally. Moderate spinal stenosis at L4-5 with moderate subarticular stenosis bilaterally. Electronically Signed   By: Marlan Palau M.D.   On: 05/26/2020 18:58    Assessment/Plan: Postop day #1: The patient is doing much better.  It looks like she will need rehab.  I have answered all her questions.  LOS: 1 day     Cristi Loron 05/27/2020, 9:13 AM

## 2020-05-27 NOTE — Progress Notes (Signed)
Subjective: The patient is somnolent but arousable.  She is in no apparent distress.  Objective: Vital signs in last 24 hours: Temp:  [97.5 F (36.4 C)-98.4 F (36.9 C)] 97.5 F (36.4 C) (06/30 2020) Pulse Rate:  [86-92] 90 (06/30 2020) Resp:  [18-20] 20 (06/30 1549) BP: (139-176)/(64-84) 165/81 (06/30 2020) SpO2:  [96 %-98 %] 96 % (06/30 2020) Estimated body mass index is 36.87 kg/m as calculated from the following:   Height as of this encounter: 5' 5.5" (1.664 m).   Weight as of this encounter: 102.1 kg.   Intake/Output from previous day: 06/30 0701 - 07/01 0700 In: 2160 [P.O.:560; I.V.:1600] Out: 900 [Urine:800; Blood:100] Intake/Output this shift: Total I/O In: 1600 [I.V.:1600] Out: 100 [Blood:100]  Physical exam the patient is somnolent but arousable.  Her dressing is clean and dry.  There is no hematoma or shift.  She is moving her bilateral upper extremities and right lower extremity well.  She remains weak in her left lower extremity. Lab Results: Recent Labs    05/24/20 1608 05/26/20 2103  WBC 5.2 5.6  HGB 13.3 12.8  HCT 40.9 38.5  PLT 226 215   BMET Recent Labs    05/24/20 1608 05/26/20 2103  NA 133* 136  K 4.2 4.0  CL 98 103  CO2 26 25  GLUCOSE 103* 119*  BUN 14 16  CREATININE 0.94 0.82  CALCIUM 9.0 8.8*    Studies/Results: DG Cervical Spine 1 View  Result Date: 05/26/2020 CLINICAL DATA:  Surgery. EXAM: DG CERVICAL SPINE - 1 VIEW COMPARISON:  MRI earlier today. FINDINGS: Single lateral view of the cervical spine demonstrates surgical instrument localizing anteriorly at the level of C4-C5. IMPRESSION: Surgical instrument localizing anteriorly at the level of C4-C5. Electronically Signed   By: Narda Rutherford M.D.   On: 05/26/2020 23:06   MR BRAIN WO CONTRAST  Result Date: 05/25/2020 CLINICAL DATA:  Initial evaluation for acute left lower extremity weakness. EXAM: MRI HEAD WITHOUT CONTRAST TECHNIQUE: Multiplanar, multiecho pulse sequences of the  brain and surrounding structures were obtained without intravenous contrast. COMPARISON:  Prior head CT from 05/24/2020. FINDINGS: Brain: Generalized age-related cerebral atrophy. Minimal chronic microvascular ischemic changes for age. No abnormal foci of restricted diffusion to suggest acute or subacute ischemia. Gray-white matter differentiation maintained. No encephalomalacia to suggest chronic cortical infarction. No evidence for acute intracranial hemorrhage. Single punctate focus of susceptibility artifact noted at the left parietal region, consistent with a small chronic microhemorrhage, of doubtful significance in isolation. No mass lesion, midline shift or mass effect. No hydrocephalus or extra-axial fluid collection. Pituitary gland suprasellar region normal. Midline structures intact and normal. Vascular: Major intracranial vascular flow voids are maintained. Skull and upper cervical spine: Craniocervical junction within normal limits. Bone marrow signal intensity normal. Well-circumscribed subcentimeter T1 hyperintense lesion noted at the right parietal calvarium, nonspecific, but most likely benign and of doubtful significance. No scalp soft tissue abnormality. Sinuses/Orbits: Globes and orbital soft tissues within normal limits. Chronic right maxillary sinusitis noted. Paranasal sinuses are otherwise largely clear. No mastoid effusion. Inner ear structures grossly normal. Other: None. IMPRESSION: 1. Negative brain MRI for age, with no acute intracranial abnormality identified. 2. Chronic right maxillary sinusitis. Electronically Signed   By: Rise Mu M.D.   On: 05/25/2020 03:29   MR CERVICAL SPINE WO CONTRAST  Addendum Date: 05/26/2020   ADDENDUM REPORT: 05/26/2020 19:25 ADDENDUM: These results were called by telephone at the time of interpretation on 05/26/2020 at 7:24 pm to provider Rathore ,  who verbally acknowledged these results. Electronically Signed   By: Marlan Palau M.D.   On:  05/26/2020 19:25   Result Date: 05/26/2020 CLINICAL DATA:  Cervical radiculopathy no red flags EXAM: MRI CERVICAL SPINE WITHOUT CONTRAST TECHNIQUE: Multiplanar, multisequence MR imaging of the cervical spine was performed. No intravenous contrast was administered. COMPARISON:  CT cervical spine 12/04/2018 FINDINGS: Alignment: Mild anterolisthesis C4-5.  2 mm anterolisthesis C7-T1. Vertebrae: Negative for fracture or mass. Cord: Cord compression at C7-T1 with cord hyperintensity which is ill-defined. This may be acute. Remainder of the cord signal is normal. Posterior Fossa, vertebral arteries, paraspinal tissues: Negative Disc levels: C2-3: Negative C3-4: Bilateral facet degeneration and mild disc degeneration. Mild foraminal stenosis bilaterally due to spurring. C4-5: Moderate facet degeneration bilaterally. Disc degeneration and spurring. Moderate foraminal encroachment bilaterally due to spurring. C5-6: Bilateral facet degeneration. Disc degeneration and diffuse uncinate spurring. Severe right foraminal encroachment and moderate to severe left foraminal encroachment due to spurring. Mild spinal stenosis. C6-7: Disc degeneration with diffuse uncinate spurring. Bilateral facet hypertrophy. Mild spinal stenosis and moderate foraminal stenosis bilaterally. C7-T1: Large extruded disc fragment to the left of midline. There is cord compression and cord hyperintensity. There is bilateral facet hypertrophy and mild foraminal stenosis bilaterally. IMPRESSION: Multilevel degenerative change. Disc and facet degeneration and spurring causing foraminal encroachment at multiple levels as above. Large extruded disc fragment to the left of midline at C7-T1. There is cord compression and ill-defined cord hyperintensity which may be acute. Electronically Signed: By: Marlan Palau M.D. On: 05/26/2020 19:15   MR LUMBAR SPINE WO CONTRAST  Result Date: 05/26/2020 CLINICAL DATA:  Low back pain.  Progressive neurologic deficit.  EXAM: MRI LUMBAR SPINE WITHOUT CONTRAST TECHNIQUE: Multiplanar, multisequence MR imaging of the lumbar spine was performed. No intravenous contrast was administered. COMPARISON:  None. FINDINGS: Segmentation:  Normal Alignment:  Mild anterolisthesis L3-4 L4-5 Vertebrae:  Normal bone marrow.  Negative for fracture or mass. Conus medullaris and cauda equina: Conus extends to the L2 level. Conus and cauda equina appear normal. Paraspinal and other soft tissues: Negative for paraspinous mass or adenopathy. No soft tissue edema or fluid collection. Diffuse muscular atrophy. Disc levels: L1-2: Broad-based central disc protrusion and mild facet degeneration. Negative for stenosis L2-3: Mild disc bulging and moderate facet hypertrophy bilaterally. Mild subarticular stenosis bilaterally L3-4: Disc degeneration with diffuse bulging of the disc. Moderate to advanced facet hypertrophy bilaterally. Mild spinal stenosis and mild subarticular stenosis bilaterally. L4-5: Small right foraminal disc protrusion displacing the right L4 nerve root. Severe facet degeneration. Moderate spinal stenosis and moderate subarticular stenosis bilaterally. L5-S1: Small central disc protrusion and mild facet degeneration. Negative for spinal or foraminal stenosis. IMPRESSION: Multilevel degenerative change throughout the lumbar spine. Negative for lumbar fracture Mild spinal stenosis L3-4 with mild subarticular stenosis bilaterally. Moderate spinal stenosis at L4-5 with moderate subarticular stenosis bilaterally. Electronically Signed   By: Marlan Palau M.D.   On: 05/26/2020 18:58    Assessment/Plan: The patient is doing well.  I attempted to call her husband but got the voicemail.  I left a message.  LOS: 1 day     Cristi Loron 05/27/2020, 12:59 AM

## 2020-05-27 NOTE — Op Note (Signed)
Brief history: The patient is a 74 year old white female who presented with a left lower extremity mono paresis.  She was worked up with a cervical MRI which demonstrated a large herniated disc at C7-T1.  I discussed the various treatment options with her.  She has weighed the risks, benefits and alternatives of surgery and decided proceed with a C7-T1 anterior cervical discectomy, fusion and plating.  Preoperative diagnosis: C7-T1 herniated disc, cervical myelopathy, cervical spinal stenosis, left lower extremity monoparesis  Postoperative diagnosis: The same  Procedure: C7-T1 anterior cervical discectomy/decompression; C7-T1 interbody arthrodesis with local morcellized autograft bone and Zimmer DBM; insertion of interbody prosthesis at C7-T1 (Zimmer peek plasma coated interbody prosthesis); anterior cervical plating from C7-T1 with globus titanium plate  Surgeon: Dr. Delma Officer  Asst.: Hildred Priest, NP  Anesthesia: Gen. endotracheal  Estimated blood loss: 100 cc  Drains: None  Complications: None  Description of procedure: The patient was brought to the operating room by the anesthesia team. General endotracheal anesthesia was induced. A roll was placed under the patient's shoulders to keep the neck in the neutral position. The patient's anterior cervical region was then prepared with Betadine scrub and Betadine solution. Sterile drapes were applied.  The area to be incised was then injected with Marcaine with epinephrine solution. I then used a scalpel to make a transverse incision in the patient's left anterior neck. I used the Metzenbaum scissors to divide the platysmal muscle and then to dissect medial to the sternocleidomastoid muscle, jugular vein, and carotid artery. I carefully dissected down towards the anterior cervical spine identifying the esophagus and retracting it medially. Then using Kitner swabs to clear soft tissue from the anterior cervical spine. We then inserted a bent  spinal needle into the upper exposed intervertebral disc space. We then obtained intraoperative radiographs confirm our location.  We counted down to the C7-T1 interspace  I then used electrocautery to detach the medial border of the longus colli muscle bilaterally from the C7-T1 intervertebral disc spaces. I then inserted the Caspar self-retaining retractor underneath the longus colli muscle bilaterally to provide exposure.  We then incised the intervertebral disc at C7 and T1. We then performed a partial intervertebral discectomy with a pituitary forceps and the Karlin curettes. I then inserted distraction screws into the vertebral bodies at C7-T1. We then distracted the interspace.  We brought the operative microscope into the field and under its magnification and illumination we completed the decompression.  We then used the high-speed drill to decorticate the vertebral endplates at C7-T1, to drill away the remainder of the intervertebral disc, to drill away some posterior spondylosis, and to thin out the posterior longitudinal ligament. I then incised ligament with the arachnoid knife. We then removed the ligament with a Kerrison punches undercutting the vertebral endplates and decompressing the thecal sac.  As expected we encountered a large herniated disc centrally into the left.  In fact the disc herniation was intradural.  We will remove the disc herniation we encountered a hole in the dura and some protruding arachnoid.  We then performed foraminotomies about the bilateral C8 nerve roots. This completed the decompression at this level.  We placed FloSeal and some dural sealant over the durotomy.  We now turned our to attention to the interbody fusion. We used the trial spacers to determine the appropriate size for the interbody prosthesis. We then pre-filled prosthesis with a combination of local morcellized autograft bone that we obtained during decompression as well as Zimmer DBM. We then inserted  the prosthesis into the distracted interspace at C7-T1. We then removed the distraction screws. There was a good snug fit of the prosthesis in the interspace.  Having completed the fusion we now turned attention to the anterior spinal instrumentation. We used the high-speed drill to drill away some anterior spondylosis at the disc spaces so that the plate lay down flat. We selected the appropriate length titanium anterior cervical plate. We laid it along the anterior aspect of the vertebral bodies from C7-T1. We then drilled 12 mm holes at C7 and T1. We then secured the plate to the vertebral bodies by placing two 12 mm self-tapping screws at C7 and T1. We then obtained intraoperative radiograph. The demonstrating limited visualization of the instrumentation.  The construct looked good in vivo.  We therefore secured the screws the plate the locking each cam. This completed the instrumentation.  We then obtained hemostasis using bipolar electrocautery. We irrigated the wound out with bacitracin solution. We then removed the retractor. We inspected the esophagus for any damage. There was none apparent. We then reapproximated patient's platysmal muscle with interrupted 3-0 Vicryl suture. We then reapproximated the subcutaneous tissue with interrupted 3-0 Vicryl suture. The skin was reapproximated with Steri-Strips and benzoin. The wound was then covered with bacitracin ointment. A sterile dressing was applied. The drapes were removed. Patient was subsequently extubated by the anesthesia team and transported to the post anesthesia care unit in stable condition. All sponge instrument and needle counts were reportedly correct at the end of this case.

## 2020-05-27 NOTE — Progress Notes (Signed)
PROGRESS NOTE    [name]  QVZ:[id number] DOB: 1946-02-08 DOA: 05/24/2020 PCP: Mayer Masker, PA-C    Brief Narrative:  74 year old Caucasian female, retired Engineer, civil (consulting), with past medical history significant for hypertension, hyperlipidemia, hypothyroidism, chronic diastolic CHF, venostasis, alcohol use disorder and reported GI bleed for which EGD was said to have revealed bleeding ulcer that was "clipped ".  Patient is worried if the clip is MRI compatible.  Patient's history is quite suggestible.  Patient presented with left lower extremity pain that sounded close to radiculopathy.  Patient also reported what appeared to be radiculopathic pain from the neck area, radiating to the left upper extremity.  MRI of the brain is nonrevealing.  We will have a low threshold to proceed with several: Lumbosacral MRI if no contraindications.  Input from physical therapy and Occupational Therapy is appreciated.  CIR has been recommended.  Assessment & Plan:   Principal Problem:   Left leg weakness Active Problems:   Hypertension   Hypothyroidism   HLD (hyperlipidemia)   Alcohol use   Pressure injury of skin   Radiculopathy   Cervical herniated disc  Left lower extremity weakness/numbness/neck pain radiating to the left upper extremity secondary to cord compression from C7-T1 disc fragment:  -Head CT negative for acute stroke.  -MRI brain is nonrevealing.   -Pt continued to complain of L sided weakness and neck pain, prompting cervical and lumbar MRI. Findings reviewed and concerning for acute cord compression from C7-T1 disc fracgment -Pt now s/p emergent cervical discectomy/decompression. Appreciate assistance by Neurosurgery -This AM, pt reports feeling stronger after decompression surgery. Therapy recommends CIR and pt agrees. Will follow  Alcohol use disorder:  -Patient reports drinking a bottle of wine daily. Currently not displaying any signs of withdrawal but is at high risk  for withdrawal during this hospitalization. -Continue with CIWA protocol; Ativan as needed. Thiamine and folate. -No evidence of acute withdrawals thus far  Hypertension:  - controlled at this time, stable  Macrocytosis without anemia: Hemoglobin 13.3, MCV 109. Likely related to chronic alcohol abuse. -B12 is 1312. Folate is 22.6.    Hyperlipidemia -Continue Crestor as tolerated  Hypothyroidism -Continue Synthroid  GERD -Continue PPI  Chronic diastolic CHF:  -Presently compensated.    DVT prophylaxis: Lovenox subq Code Status: Full Family Communication: Pt in room, family not at bedside  Status is: Inpatient  The patient will require care spanning > 2 midnights and should be moved to inpatient because: Inpatient level of care appropriate due to severity of illness  Dispo: The patient is from: Home              Anticipated d/c is to: CIR              Anticipated d/c date is: 1 day              Patient currently is not medically stable to d/c.  Consultants:   Neurosurgery  Procedures:   Cervical discectomy/decompression at C7-T1  Antimicrobials: Anti-infectives (From admission, onward)   Start     Dose/Rate Route Frequency Ordered Stop   05/27/20 0600  ceFAZolin (ANCEF) IVPB 2g/100 mL premix        2 g 200 mL/hr over 30 Minutes Intravenous Every 8 hours 05/27/20 0231 05/27/20 1401   05/26/20 2306  bacitracin 50,000 Units in sodium chloride 0.9 % 500 mL irrigation  Status:  Discontinued          As needed 05/26/20 2307 05/27/20 0057   05/26/20 2133  ceFAZolin (ANCEF) 2-4 GM/100ML-% IVPB       Note to Pharmacy: Isidore Moos   : cabinet override      05/26/20 2133 05/27/20 0557      Subjective: Post-operatively, patient reports much improved strength in LUE and LLE. Neck pain also improved  Objective: Vitals:   05/27/20 0257 05/27/20 0530 05/27/20 0832 05/27/20 1303  BP: (!) 172/81 (!) 167/84 (!) 170/85 96/60  Pulse: 81 90 94 92  Resp: 18 18 18  18   Temp: 98 F (36.7 C) 97.7 F (36.5 C) 98.3 F (36.8 C) 98.3 F (36.8 C)  TempSrc: Oral Oral Oral Oral  SpO2: 95% 96% 96% 94%  Weight:      Height:        Intake/Output Summary (Last 24 hours) at 05/27/2020 1519 Last data filed at 05/27/2020 07/28/2020 Gross per 24 hour  Intake 1637.5 ml  Output 2200 ml  Net -562.5 ml   Filed Weights   05/24/20 1544  Weight: 102.1 kg    Examination: General exam: Awake, laying in bed, in nad Respiratory system: Normal respiratory effort, no wheezing Cardiovascular system: regular rate, s1, s2 Gastrointestinal system: Soft, nondistended, positive BS Central nervous system: CN2-12 grossly intact, strength intact Extremities: Perfused, no clubbing Skin: Normal skin turgor, no notable skin lesions seen Psychiatry: Mood normal // no visual hallucinations   Data Reviewed: I have personally reviewed following labs and imaging studies  CBC: Recent Labs  Lab 05/24/20 1608 05/26/20 2103  WBC 5.2 5.6  NEUTROABS 2.5  --   HGB 13.3 12.8  HCT 40.9 38.5  MCV 109.1* 107.8*  PLT 226 215   Basic Metabolic Panel: Recent Labs  Lab 05/24/20 1608 05/25/20 0500 05/26/20 2103  NA 133*  --  136  K 4.2  --  4.0  CL 98  --  103  CO2 26  --  25  GLUCOSE 103*  --  119*  BUN 14  --  16  CREATININE 0.94  --  0.82  CALCIUM 9.0  --  8.8*  MG  --  2.4  --   PHOS  --  3.8  --    GFR: Estimated Creatinine Clearance: 73.1 mL/min (by C-G formula based on SCr of 0.82 mg/dL). Liver Function Tests: Recent Labs  Lab 05/24/20 1608  AST 26  ALT 19  ALKPHOS 80  BILITOT 0.7  PROT 6.3*  ALBUMIN 3.4*   No results for input(s): LIPASE, AMYLASE in the last 168 hours. No results for input(s): AMMONIA in the last 168 hours. Coagulation Profile: Recent Labs  Lab 05/24/20 1608 05/26/20 2103  [name] 1.1 1.0   Cardiac Enzymes: No results for input(s): [name], [name], CKMBINDEX, TROPONINI in the last 168 hours. BNP (last 3 results) No results for input(s):  PROBNP in the last 8760 hours. HbA1C: No results for input(s): HGBA1C in the last 72 hours. CBG: No results for input(s): GLUCAP in the last 168 hours. Lipid Profile: No results for input(s): CHOL, HDL, LDLCALC, TRIG, CHOLHDL, LDLDIRECT in the last 72 hours. Thyroid Function Tests: No results for input(s): TSH, T4TOTAL, FREET4, T3FREE, THYROIDAB in the last 72 hours. Anemia Panel: Recent Labs    05/25/20 0500  VITAMINB12 1,312*  FOLATE 22.6   Sepsis Labs: No results for input(s): PROCALCITON, LATICACIDVEN in the last 168 hours.  Recent Results (from the past 240 hour(s))  SARS Coronavirus 2 by RT PCR (hospital order, performed in [facility] hospital lab) Nasopharyngeal     Status: None  Collection Time: 05/25/20  1:00 AM   Specimen: Nasopharyngeal  Result Value Ref Range Status   SARS Coronavirus 2 NEGATIVE NEGATIVE Final    Comment: (NOTE) SARS-CoV-2 target nucleic acids are NOT DETECTED.  The SARS-CoV-2 RNA is generally detectable in upper and lower respiratory specimens during the acute phase of infection. The lowest concentration of SARS-CoV-2 viral copies this assay can detect is 250 copies / mL. A negative result does not preclude SARS-CoV-2 infection and should not be used as the sole basis for treatment or other patient management decisions.  A negative result may occur with improper specimen collection / handling, submission of specimen other than nasopharyngeal swab, presence of viral mutation(s) within the areas targeted by this assay, and inadequate number of viral copies (<250 copies / mL). A negative result must be combined with clinical observations, patient history, and epidemiological information.  Fact Sheet for Patients:   BoilerBrush.com.cyhttps://www.fda.gov/media/136312/download  Fact Sheet for Healthcare Providers: https://pope.com/https://www.fda.gov/media/136313/download  This test is not yet approved or  cleared by the [place] FDA and has been authorized for detection  and/or diagnosis of SARS-CoV-2 by FDA under an Emergency Use Authorization (EUA).  This EUA will remain in effect (meaning this test can be used) for the duration of the COVID-19 declaration under Section 564(b)(1) of the Act, 21 U.S.C. section 360bbb-3(b)(1), unless the authorization is terminated or revoked sooner.  Performed at [facility], [address] [place], [place] [address]      Radiology Studies: DG Cervical Spine 1 View  Result Date: 05/27/2020 CLINICAL DATA:  Cervical spine surgery EXAM: DG CERVICAL SPINE - 1 VIEW COMPARISON:  05/26/2020 FINDINGS: Patient is intubated. There are multiple metallic opacities that project over the neck including what are suspected to be surgical sponges anterior to the C7 level. IMPRESSION: Multiple metallic opacities that project over the neck including what are suspected to be surgical sponges anterior to the C7 level. Electronically Signed   By: Deatra RobinsonKevin  Herman M.D.   On: 05/27/2020 01:15   DG Cervical Spine 1 View  Result Date: 05/26/2020 CLINICAL DATA:  Surgery. EXAM: DG CERVICAL SPINE - 1 VIEW COMPARISON:  MRI earlier today. FINDINGS: Single lateral view of the cervical spine demonstrates surgical instrument localizing anteriorly at the level of C4-C5. IMPRESSION: Surgical instrument localizing anteriorly at the level of C4-C5. Electronically Signed   By: Narda RutherfordMelanie  Sanford M.D.   On: 05/26/2020 23:06   MR CERVICAL SPINE WO CONTRAST  Addendum Date: 05/26/2020   ADDENDUM REPORT: 05/26/2020 19:25 ADDENDUM: These results were called by telephone at the time of interpretation on 05/26/2020 at 7:24 pm to provider Rathore , who verbally acknowledged these results. Electronically Signed   By: Marlan Palauharles  Clark M.D.   On: 05/26/2020 19:25   Result Date: 05/26/2020 CLINICAL DATA:  Cervical radiculopathy no red flags EXAM: MRI CERVICAL SPINE WITHOUT CONTRAST TECHNIQUE: Multiplanar, multisequence MR imaging of the cervical spine was performed. No  intravenous contrast was administered. COMPARISON:  CT cervical spine 12/04/2018 FINDINGS: Alignment: Mild anterolisthesis C4-5.  2 mm anterolisthesis C7-T1. Vertebrae: Negative for fracture or mass. Cord: Cord compression at C7-T1 with cord hyperintensity which is ill-defined. This may be acute. Remainder of the cord signal is normal. Posterior Fossa, vertebral arteries, paraspinal tissues: Negative Disc levels: C2-3: Negative C3-4: Bilateral facet degeneration and mild disc degeneration. Mild foraminal stenosis bilaterally due to spurring. C4-5: Moderate facet degeneration bilaterally. Disc degeneration and spurring. Moderate foraminal encroachment bilaterally due to spurring. C5-6: Bilateral facet degeneration. Disc degeneration and  diffuse uncinate spurring. Severe right foraminal encroachment and moderate to severe left foraminal encroachment due to spurring. Mild spinal stenosis. C6-7: Disc degeneration with diffuse uncinate spurring. Bilateral facet hypertrophy. Mild spinal stenosis and moderate foraminal stenosis bilaterally. C7-T1: Large extruded disc fragment to the left of midline. There is cord compression and cord hyperintensity. There is bilateral facet hypertrophy and mild foraminal stenosis bilaterally. IMPRESSION: Multilevel degenerative change. Disc and facet degeneration and spurring causing foraminal encroachment at multiple levels as above. Large extruded disc fragment to the left of midline at C7-T1. There is cord compression and ill-defined cord hyperintensity which may be acute. Electronically Signed: By: Marlan Palau M.D. On: 05/26/2020 19:15   MR LUMBAR SPINE WO CONTRAST  Result Date: 05/26/2020 CLINICAL DATA:  Low back pain.  Progressive neurologic deficit. EXAM: MRI LUMBAR SPINE WITHOUT CONTRAST TECHNIQUE: Multiplanar, multisequence MR imaging of the lumbar spine was performed. No intravenous contrast was administered. COMPARISON:  None. FINDINGS: Segmentation:  Normal Alignment:   Mild anterolisthesis L3-4 L4-5 Vertebrae:  Normal bone marrow.  Negative for fracture or mass. Conus medullaris and cauda equina: Conus extends to the L2 level. Conus and cauda equina appear normal. Paraspinal and other soft tissues: Negative for paraspinous mass or adenopathy. No soft tissue edema or fluid collection. Diffuse muscular atrophy. Disc levels: L1-2: Broad-based central disc protrusion and mild facet degeneration. Negative for stenosis L2-3: Mild disc bulging and moderate facet hypertrophy bilaterally. Mild subarticular stenosis bilaterally L3-4: Disc degeneration with diffuse bulging of the disc. Moderate to advanced facet hypertrophy bilaterally. Mild spinal stenosis and mild subarticular stenosis bilaterally. L4-5: Small right foraminal disc protrusion displacing the right L4 nerve root. Severe facet degeneration. Moderate spinal stenosis and moderate subarticular stenosis bilaterally. L5-S1: Small central disc protrusion and mild facet degeneration. Negative for spinal or foraminal stenosis. IMPRESSION: Multilevel degenerative change throughout the lumbar spine. Negative for lumbar fracture Mild spinal stenosis L3-4 with mild subarticular stenosis bilaterally. Moderate spinal stenosis at L4-5 with moderate subarticular stenosis bilaterally. Electronically Signed   By: Marlan Palau M.D.   On: 05/26/2020 18:58    Scheduled Meds: . acetaminophen  1,000 mg Oral Q6H  . benazepril  20 mg Oral Daily   And  . hydrochlorothiazide  25 mg Oral Daily  . carvedilol  6.25 mg Oral BID WC  . docusate sodium  100 mg Oral BID  . ferrous sulfate  325 mg Oral Q breakfast  . folic acid  1 mg Oral Daily  . gabapentin  1,200 mg Oral QHS  . gabapentin  1,500 mg Oral Daily  . levothyroxine  125 mcg Oral Once per day on Tue Fri  . levothyroxine  150 mcg Oral Once per day on Sun Mon Wed Thu Sat  . pantoprazole (PROTONIX) IV  40 mg Intravenous QHS  . rosuvastatin  5 mg Oral Daily  . thiamine  100 mg Oral  Daily   Or  . thiamine  100 mg Intravenous Daily   Continuous Infusions: . sodium chloride 250 mL (05/27/20 0621)  . lactated ringers 75 mL/hr at 05/27/20 0615     LOS: 1 day   Rickey Barbara, MD [facility] Pager On Amion  If 7PM-7AM, please contact night-coverage 05/27/2020, 3:19 PM

## 2020-05-27 NOTE — Progress Notes (Signed)
05/27/20 1709  PT Visit Information  Last PT Received On 05/27/20  Assistance Needed +2 (for safe mobility)  PT/OT/SLP Co-Evaluation/Treatment Yes  Reason for Co-Treatment For patient/therapist safety;To address functional/ADL transfers;Complexity of the patient's impairments (multi-system involvement)  PT goals addressed during session Mobility/safety with mobility;Balance;Proper use of DME  History of Present Illness 74 yo female admitted with L leg weakness and numb from knee down.  MRI of head negative. PMH HTN HLD CHF venous statis alcohol use disorder. Underwent C7-T1 anterior cervical discectomy/decompression 05/26/2020.   Subjective Data  Patient Stated Goal to be independent again  Precautions  Precautions Fall;Cervical  Precaution Booklet Issued No  Precaution Comments Verbally reviewed cervical precautions.   Restrictions  Weight Bearing Restrictions No  Pain Assessment  Pain Assessment 0-10  Pain Score 3  Pain Location neck  Pain Descriptors / Indicators Discomfort;Sore  Pain Intervention(s) Monitored during session;Limited activity within patient's tolerance;Repositioned  Cognition  Arousal/Alertness Awake/alert  Behavior During Therapy WFL for tasks assessed/performed  Overall Cognitive Status Within Functional Limits for tasks assessed  Bed Mobility  Overal bed mobility Needs Assistance  Bed Mobility Rolling;Sidelying to Sit  Rolling Min assist  Sidelying to sit Mod assist  General bed mobility comments Min A for assist with rolling and mod A for trunk elevation.   Transfers  Overall transfer level Needs assistance  Equipment used Rolling walker (2 wheeled)  Transfers Sit to/from [employer] Transfers  Sit to Stand Mod assist;+2 physical assistance;From elevated surface  Stand pivot transfers Min assist;+2 physical assistance  General transfer comment VC for correct hand placement and positioning in RW due to LLE weakness/hx of buckling  Ambulation/Gait   Ambulation/Gait assistance Mod assist;Min assist;+2 physical assistance  Gait Distance (Feet) 2 Feet  Assistive device Rolling walker (2 wheeled)  Gait Pattern/deviations Step-to pattern;Decreased step length - right;Decreased step length - left;Decreased weight shift to left  General Gait Details Took steps from [facility] to chair using RW. Cues for appropriate use of RW. Noted instability of L knee. Required min to mod A for steadying.   Gait velocity Decreased  Balance  Overall balance assessment Needs assistance  Sitting-balance support No upper extremity supported  Sitting balance-Leahy Scale Fair  Standing balance support Bilateral upper extremity supported  Standing balance-Leahy Scale Poor  Standing balance comment reliant on BUE support and external support  PT - End of Session  Equipment Utilized During Treatment Gait belt  Activity Tolerance Patient tolerated treatment well  Patient left with call bell/phone within reach;in chair;Other (comment) (wit OT present )  Nurse Communication Mobility status   PT - Assessment/Plan  PT Plan Current plan remains appropriate  PT Visit Diagnosis Unsteadiness on feet (R26.81);Muscle weakness (generalized) (M62.81);Difficulty in walking, not elsewhere classified (R26.2)  PT Frequency (ACUTE ONLY) Min 3X/week  Follow Up Recommendations CIR  PT equipment Wheelchair cushion (measurements PT);Wheelchair (measurements PT)  AM-PAC PT "6 Clicks" Mobility Outcome Measure (Version 2)  Help needed turning from your back to your side while in a flat bed without using bedrails? 3  Help needed moving from lying on your back to sitting on the side of a flat bed without using bedrails? 2  Help needed moving to and from a bed to a chair (including a wheelchair)? 2  Help needed standing up from a chair using your arms (e.g., wheelchair or bedside chair)? 2  Help needed to walk in hospital room? 2  Help needed climbing 3-5 steps with a railing?  1  6 Click  Score 12  Consider Recommendation of Discharge To: CIR/SNF/LTACH  PT Goal Progression  Progress towards PT goals Progressing toward goals  Acute Rehab PT Goals  PT Goal Formulation With patient  Time For Goal Achievement 06/08/20  Potential to Achieve Goals Good  PT Time Calculation  PT Start Time (ACUTE ONLY) 1145  PT Stop Time (ACUTE ONLY) 1219  PT Time Calculation (min) (ACUTE ONLY) 34 min  PT General Charges  $$ ACUTE PT VISIT 1 Visit  PT Treatments  $Therapeutic Activity 8-22 mins   Pt progressing towards goals. Improved strength noted in LLE, however, remains at a 2+/3-/5 strength throughout. Noted instability in L knee throughout session. Pt requiring min to mod A +2 for mobility tasks using RW. Educated about cervical precautions. Feel current recommendations appropriate as pt very motivated to regain independence. Will continue to follow acutely.   Farley Ly, PT, DPT  Acute Rehabilitation Services  Pager: [phone] Office: [phone]

## 2020-05-27 NOTE — Progress Notes (Signed)
Upon arrival to PACU patient unable to move left leg/foot although she has full sensation.  Dr. Lovell Sheehan made aware, no new orders.  Dr. Lovell Sheehan request that [employer] remain at 30 to 45 degrees.  Will continue to monitor.

## 2020-05-27 NOTE — Anesthesia Postprocedure Evaluation (Signed)
Anesthesia Post Note  Patient: [name]  Procedure(s) Performed: Cervical seven - Thoracic one ANTERIOR CERVICAL DISCECTOMY AND PLATING (Left Neck)     Patient location during evaluation: PACU Anesthesia Type: General Level of consciousness: awake and alert Pain management: pain level controlled Vital Signs Assessment: post-procedure vital signs reviewed and stable Respiratory status: spontaneous breathing, nonlabored ventilation, respiratory function stable and patient connected to nasal cannula oxygen Cardiovascular status: blood pressure returned to baseline and stable Postop Assessment: no apparent nausea or vomiting Anesthetic complications: no   No complications documented.  Last Vitals:  Vitals:   05/27/20 0215 05/27/20 0257  BP: (!) 163/75 (!) 172/81  Pulse: 82 81  Resp: 14 18  Temp:  36.7 C  SpO2: 92% 95%                  Beryle Lathe

## 2020-05-27 NOTE — Transfer of Care (Signed)
Immediate Anesthesia Transfer of Care Note  Patient: [name]  Procedure(s) Performed: Cervical seven - Thoracic one ANTERIOR CERVICAL DISCECTOMY AND PLATING (Left Neck)  Patient Location: PACU  Anesthesia Type:General  Level of Consciousness: awake, oriented and sedated  Airway & Oxygen Therapy: Patient Spontanous Breathing  Post-op Assessment: Report given to RN and Post -op Vital signs reviewed and stable  Post vital signs: Reviewed and stable  Last Vitals:  Vitals Value Taken Time  BP 151/81 05/27/20 0104  Temp    Pulse 100 05/27/20 0108  Resp 17 05/27/20 0108  SpO2 93 % 05/27/20 0108  Vitals shown include unvalidated device data.  Last Pain:  Vitals:   05/26/20 2020  TempSrc: Oral  PainSc:       Patients Stated Pain Goal: 3 (05/25/20 2027)  Complications: No complications documented.

## 2020-05-28 ENCOUNTER — Encounter (HOSPITAL_COMMUNITY): Payer: Self-pay | Admitting: Neurosurgery

## 2020-05-28 MED ORDER — PANTOPRAZOLE SODIUM 40 MG PO TBEC
40.0000 mg | DELAYED_RELEASE_TABLET | Freq: Every day | ORAL | Status: DC
  Administered 2020-05-29: 40 mg via ORAL
  Filled 2020-05-28 (×2): qty 1

## 2020-05-28 MED ORDER — FUROSEMIDE 20 MG PO TABS
20.0000 mg | ORAL_TABLET | Freq: Every day | ORAL | Status: DC
  Administered 2020-05-28 – 2020-05-29 (×2): 20 mg via ORAL
  Filled 2020-05-28 (×2): qty 1

## 2020-05-28 NOTE — Progress Notes (Signed)
Physical Therapy Treatment Patient Details Name: [name] MRN: [id number] DOB: [birth year] Today's Date: 05/28/2020    History of Present Illness 74 yo female admitted with L leg weakness and numb from knee down.  MRI of head negative. PMH HTN HLD CHF venous statis alcohol use disorder. Underwent C7-T1 anterior cervical discectomy/decompression 05/26/2020.     PT Comments    Emphasis on education, transfer safety and progressing gait stability/stamina and gait quality.    Follow Up Recommendations  CIR     Equipment Recommendations  Other (comment) (TBD next venue)    Recommendations for Other Services       Precautions / Restrictions Precautions Precautions: Fall;Cervical Precaution Comments: Verbally reviewed cervical precautions.     Mobility  Bed Mobility Overal bed mobility: Needs Assistance             General bed mobility comments: up in the chair on arrival  Transfers Overall transfer level: Needs assistance Equipment used: Rolling walker (2 wheeled) Transfers: Sit to/from Stand Sit to Stand: Mod assist;+2 physical assistance;From elevated surface         General transfer comment: cues for hand placement, assist for coming forward and boost.  Ambulation/Gait Ambulation/Gait assistance: Mod assist;+2 safety/equipment Gait Distance (Feet): 10 Feet (22 feet 2nd trial) Assistive device: Rolling walker (2 wheeled) Gait Pattern/deviations: Step-through pattern Gait velocity: Decreased Gait velocity interpretation: <1.31 ft/sec, indicative of household ambulator General Gait Details: weak L LE with effortful advancement with weak heel/toe pattern   Stairs             Wheelchair Mobility    Modified Rankin (Stroke Patients Only)       Balance Overall balance assessment: Needs assistance   Sitting balance-Leahy Scale: Fair       Standing balance-Leahy Scale: Poor Standing balance comment: reliant on BUE support and external  support                            Cognition Arousal/Alertness: Awake/alert Behavior During Therapy: WFL for tasks assessed/performed Overall Cognitive Status: Within Functional Limits for tasks assessed                                        Exercises      General Comments General comments (skin integrity, edema, etc.): husband present      Pertinent Vitals/Pain Pain Assessment: Faces Faces Pain Scale: Hurts little more Pain Location: neck Pain Descriptors / Indicators: Discomfort;Sore Pain Intervention(s): Monitored during session    Home Living                      Prior Function            PT Goals (current goals can now be found in the care plan section) Acute Rehab PT Goals Patient Stated Goal: to be independent again PT Goal Formulation: With patient Time For Goal Achievement: 06/08/20 Potential to Achieve Goals: Good Progress towards PT goals: Progressing toward goals    Frequency    Min 3X/week      PT Plan Current plan remains appropriate    Co-evaluation              AM-PAC PT "6 Clicks" Mobility   Outcome Measure  Help needed turning from your back to your side while in a flat bed without using bedrails?: A Little  Help needed moving from lying on your back to sitting on the side of a flat bed without using bedrails?: A Lot Help needed moving to and from a bed to a chair (including a wheelchair)?: A Lot Help needed standing up from a chair using your arms (e.g., wheelchair or bedside chair)?: A Lot Help needed to walk in hospital room?: A Lot Help needed climbing 3-5 steps with a railing? : Total 6 Click Score: 12    End of Session   Activity Tolerance: Patient tolerated treatment well Patient left: with call bell/phone within reach;in chair;Other (comment) Nurse Communication: Mobility status PT Visit Diagnosis: Unsteadiness on feet (R26.81);Muscle weakness (generalized) (M62.81);Difficulty in  walking, not elsewhere classified (R26.2)     Time: 4196-2229 PT Time Calculation (min) (ACUTE ONLY): 21 min  Charges:  $Gait Training: 8-22 mins                     05/28/2020  Jacinto Halim., PT Acute Rehabilitation Services [phone]  (pager) [phone]  (office)   Eliseo Gum Aundra Espin 05/28/2020, 4:43 PM

## 2020-05-28 NOTE — Progress Notes (Signed)
Providing Compassionate, Quality Care - Together   Subjective: Patient reports her mobility is continuing to improve. Her pain is well-controlled.  Objective: Vital signs in last 24 hours: Temp:  [97.6 F (36.4 C)-98.6 F (37 C)] 97.6 F (36.4 C) (07/02 0846) Pulse Rate:  [80-101] 88 (07/02 0846) Resp:  [16-18] 16 (07/02 0846) BP: (96-142)/(58-78) 129/58 (07/02 0846) SpO2:  [94 %-100 %] 97 % (07/02 0846)  Intake/Output from previous day: 07/01 0701 - 07/02 0700 In: 1921.7 [P.O.:240; I.V.:1681.7] Out: 1850 [Urine:1850] Intake/Output this shift: No intake/output data recorded.  Alert and oriented x 4 PERRLA CN II-XII grossly intact MAE, RLE 5/5, LLE hip flexor 4-/5, dorsiflexion 4-/5, plantar flexion 4-/5 Patient has chronic neuropathy in bilateral feet Incision is covered with sterile gauze and Steri Strips; Dressing is dry and intact with some dried sanguinous drainage No hematoma or shift noted at surgical site   Lab Results: Recent Labs    05/26/20 2103  WBC 5.6  HGB 12.8  HCT 38.5  PLT 215   BMET Recent Labs    05/26/20 2103  NA 136  K 4.0  CL 103  CO2 25  GLUCOSE 119*  BUN 16  CREATININE 0.82  CALCIUM 8.8*    Studies/Results: DG Cervical Spine 1 View  Result Date: 05/27/2020 CLINICAL DATA:  Cervical spine surgery EXAM: DG CERVICAL SPINE - 1 VIEW COMPARISON:  05/26/2020 FINDINGS: Patient is intubated. There are multiple metallic opacities that project over the neck including what are suspected to be surgical sponges anterior to the C7 level. IMPRESSION: Multiple metallic opacities that project over the neck including what are suspected to be surgical sponges anterior to the C7 level. Electronically Signed   By: Deatra Robinson M.D.   On: 05/27/2020 01:15   DG Cervical Spine 1 View  Result Date: 05/26/2020 CLINICAL DATA:  Surgery. EXAM: DG CERVICAL SPINE - 1 VIEW COMPARISON:  MRI earlier today. FINDINGS: Single lateral view of the cervical spine  demonstrates surgical instrument localizing anteriorly at the level of C4-C5. IMPRESSION: Surgical instrument localizing anteriorly at the level of C4-C5. Electronically Signed   By: Narda Rutherford M.D.   On: 05/26/2020 23:06   MR CERVICAL SPINE WO CONTRAST  Addendum Date: 05/26/2020   ADDENDUM REPORT: 05/26/2020 19:25 ADDENDUM: These results were called by telephone at the time of interpretation on 05/26/2020 at 7:24 pm to provider Rathore , who verbally acknowledged these results. Electronically Signed   By: Marlan Palau M.D.   On: 05/26/2020 19:25   Result Date: 05/26/2020 CLINICAL DATA:  Cervical radiculopathy no red flags EXAM: MRI CERVICAL SPINE WITHOUT CONTRAST TECHNIQUE: Multiplanar, multisequence MR imaging of the cervical spine was performed. No intravenous contrast was administered. COMPARISON:  CT cervical spine 12/04/2018 FINDINGS: Alignment: Mild anterolisthesis C4-5.  2 mm anterolisthesis C7-T1. Vertebrae: Negative for fracture or mass. Cord: Cord compression at C7-T1 with cord hyperintensity which is ill-defined. This may be acute. Remainder of the cord signal is normal. Posterior Fossa, vertebral arteries, paraspinal tissues: Negative Disc levels: C2-3: Negative C3-4: Bilateral facet degeneration and mild disc degeneration. Mild foraminal stenosis bilaterally due to spurring. C4-5: Moderate facet degeneration bilaterally. Disc degeneration and spurring. Moderate foraminal encroachment bilaterally due to spurring. C5-6: Bilateral facet degeneration. Disc degeneration and diffuse uncinate spurring. Severe right foraminal encroachment and moderate to severe left foraminal encroachment due to spurring. Mild spinal stenosis. C6-7: Disc degeneration with diffuse uncinate spurring. Bilateral facet hypertrophy. Mild spinal stenosis and moderate foraminal stenosis bilaterally. C7-T1: Large extruded disc fragment  to the left of midline. There is cord compression and cord hyperintensity. There is  bilateral facet hypertrophy and mild foraminal stenosis bilaterally. IMPRESSION: Multilevel degenerative change. Disc and facet degeneration and spurring causing foraminal encroachment at multiple levels as above. Large extruded disc fragment to the left of midline at C7-T1. There is cord compression and ill-defined cord hyperintensity which may be acute. Electronically Signed: By: Marlan Palau M.D. On: 05/26/2020 19:15   MR LUMBAR SPINE WO CONTRAST  Result Date: 05/26/2020 CLINICAL DATA:  Low back pain.  Progressive neurologic deficit. EXAM: MRI LUMBAR SPINE WITHOUT CONTRAST TECHNIQUE: Multiplanar, multisequence MR imaging of the lumbar spine was performed. No intravenous contrast was administered. COMPARISON:  None. FINDINGS: Segmentation:  Normal Alignment:  Mild anterolisthesis L3-4 L4-5 Vertebrae:  Normal bone marrow.  Negative for fracture or mass. Conus medullaris and cauda equina: Conus extends to the L2 level. Conus and cauda equina appear normal. Paraspinal and other soft tissues: Negative for paraspinous mass or adenopathy. No soft tissue edema or fluid collection. Diffuse muscular atrophy. Disc levels: L1-2: Broad-based central disc protrusion and mild facet degeneration. Negative for stenosis L2-3: Mild disc bulging and moderate facet hypertrophy bilaterally. Mild subarticular stenosis bilaterally L3-4: Disc degeneration with diffuse bulging of the disc. Moderate to advanced facet hypertrophy bilaterally. Mild spinal stenosis and mild subarticular stenosis bilaterally. L4-5: Small right foraminal disc protrusion displacing the right L4 nerve root. Severe facet degeneration. Moderate spinal stenosis and moderate subarticular stenosis bilaterally. L5-S1: Small central disc protrusion and mild facet degeneration. Negative for spinal or foraminal stenosis. IMPRESSION: Multilevel degenerative change throughout the lumbar spine. Negative for lumbar fracture Mild spinal stenosis L3-4 with mild  subarticular stenosis bilaterally. Moderate spinal stenosis at L4-5 with moderate subarticular stenosis bilaterally. Electronically Signed   By: Marlan Palau M.D.   On: 05/26/2020 18:58    Assessment/Plan: Patient is over one day status post C7-T1 ACDF. Her strength and pain are much improved since surgery.   LOS: 2 days    -Discharge to CIR when medically ready -Wear cervical collar with activity; May remove for eating -Gauze dressing discontinued as tape was irritating patient's skin   Val Eagle, DNP, AGNP-C Nurse Practitioner  [facility] [address], [place], [place] [address] P: [phone]    F: [phone]  05/28/2020, 11:52 AM

## 2020-05-28 NOTE — Progress Notes (Signed)
PROGRESS NOTE    [name]  JQB:[id number] DOB: 04-Jun-1946 DOA: 05/24/2020 PCP: Mayer Masker, PA-C    Brief Narrative:  74 year old Caucasian female, retired Engineer, civil (consulting), with past medical history significant for hypertension, hyperlipidemia, hypothyroidism, chronic diastolic CHF, venostasis, alcohol use disorder and reported GI bleed for which EGD was said to have revealed bleeding ulcer that was "clipped ".  Patient is worried if the clip is MRI compatible.  Patient's history is quite suggestible.  Patient presented with left lower extremity pain that sounded close to radiculopathy.  Patient also reported what appeared to be radiculopathic pain from the neck area, radiating to the left upper extremity.  MRI of the brain is nonrevealing.  We will have a low threshold to proceed with several: Lumbosacral MRI if no contraindications.  Input from physical therapy and Occupational Therapy is appreciated.  CIR has been recommended.  Assessment & Plan:   Principal Problem:   Left leg weakness Active Problems:   Hypertension   Hypothyroidism   HLD (hyperlipidemia)   Alcohol use   Pressure injury of skin   Radiculopathy   Cervical herniated disc  Left lower extremity weakness/numbness/neck pain radiating to the left upper extremity secondary to cord compression from C7-T1 disc fragment:  -Head CT negative for acute stroke.  -MRI brain is nonrevealing.   -Pt continued to complain of L sided weakness and neck pain, prompting cervical and lumbar MRI. Findings reviewed and concerning for acute cord compression from C7-T1 disc fracgment -Pt now s/p emergent cervical discectomy/decompression. Appreciate assistance by Neurosurgery -This AM, pt reports feeling stronger after decompression surgery. Therapy recommends CIR and pt agrees. Will follow  Alcohol use disorder:  -Patient reports drinking a bottle of wine daily. Currently not displaying any signs of withdrawal but is at high risk  for withdrawal during this hospitalization. -Continue with CIWA protocol; Ativan as needed. Thiamine and folate. -No evidence of acute withdrawals at this time  Hypertension: - controlled at this time, stable  Macrocytosis without anemia: Hemoglobin 13.3, MCV 109. Likely related to chronic alcohol abuse. -B12 is 1312. Folate is 22.6.    Hyperlipidemia -Continue Crestor as pt tolerates  Hypothyroidism -Continue Synthroid  GERD -Continue PPI  Chronic diastolic CHF:  -Presently compensated.    DVT prophylaxis: Lovenox subq Code Status: Full Family Communication: Pt in room, family not at bedside  Status is: Inpatient  The patient will require care spanning > 2 midnights and should be moved to inpatient because: Unsafe d/c plan  Dispo: The patient is from: Home              Anticipated d/c is to: CIR              Anticipated d/c date is: 1 day              Patient currently is medically stable to d/c. Just waiting on CIR bed  Consultants:   Neurosurgery  Procedures:   Cervical discectomy/decompression at C7-T1  Antimicrobials: Anti-infectives (From admission, onward)   Start     Dose/Rate Route Frequency Ordered Stop   05/27/20 0600  ceFAZolin (ANCEF) IVPB 2g/100 mL premix        2 g 200 mL/hr over 30 Minutes Intravenous Every 8 hours 05/27/20 0231 05/27/20 1401   05/26/20 2306  bacitracin 50,000 Units in sodium chloride 0.9 % 500 mL irrigation  Status:  Discontinued          As needed 05/26/20 2307 05/27/20 0057   05/26/20 2133  ceFAZolin (  ANCEF) 2-4 GM/100ML-% IVPB       Note to Pharmacy: Isidore Moos   : cabinet override      05/26/20 2133 05/27/20 0557      Subjective: Eager to start therapy  Objective: Vitals:   05/28/20 0344 05/28/20 0846 05/28/20 1215 05/28/20 1617  BP: 134/73 (!) 129/58 (!) 116/55 (!) 120/58  Pulse: 80 88 83 83  Resp: 18 16 16 18   Temp: 98 F (36.7 C) 97.6 F (36.4 C) 97.7 F (36.5 C) 97.6 F (36.4 C)  TempSrc:  Oral Oral Oral Oral  SpO2: 97% 97% 95% 95%  Weight:      Height:        Intake/Output Summary (Last 24 hours) at 05/28/2020 1748 Last data filed at 05/28/2020 1608 Gross per 24 hour  Intake 1921.69 ml  Output 950 ml  Net 971.69 ml   Filed Weights   05/24/20 1544  Weight: 102.1 kg    Examination: General exam: Conversant, in no acute distress Respiratory system: normal chest rise, clear, no audible wheezing Cardiovascular system: regular rhythm, s1-s2 Gastrointestinal system: Nondistended, nontender, pos BS Central nervous system: No seizures, no tremors Extremities: No cyanosis, no joint deformities Skin: No rashes, no pallor Psychiatry: Affect normal // no auditory hallucinations   Data Reviewed: I have personally reviewed following labs and imaging studies  CBC: Recent Labs  Lab 05/24/20 1608 05/26/20 2103  WBC 5.2 5.6  NEUTROABS 2.5  --   HGB 13.3 12.8  HCT 40.9 38.5  MCV 109.1* 107.8*  PLT 226 215   Basic Metabolic Panel: Recent Labs  Lab 05/24/20 1608 05/25/20 0500 05/26/20 2103  NA 133*  --  136  K 4.2  --  4.0  CL 98  --  103  CO2 26  --  25  GLUCOSE 103*  --  119*  BUN 14  --  16  CREATININE 0.94  --  0.82  CALCIUM 9.0  --  8.8*  MG  --  2.4  --   PHOS  --  3.8  --    GFR: Estimated Creatinine Clearance: 73.1 mL/min (by C-G formula based on SCr of 0.82 mg/dL). Liver Function Tests: Recent Labs  Lab 05/24/20 1608  AST 26  ALT 19  ALKPHOS 80  BILITOT 0.7  PROT 6.3*  ALBUMIN 3.4*   No results for input(s): LIPASE, AMYLASE in the last 168 hours. No results for input(s): AMMONIA in the last 168 hours. Coagulation Profile: Recent Labs  Lab 05/24/20 1608 05/26/20 2103  [name] 1.1 1.0   Cardiac Enzymes: No results for input(s): [name], [name], CKMBINDEX, TROPONINI in the last 168 hours. BNP (last 3 results) No results for input(s): PROBNP in the last 8760 hours. HbA1C: No results for input(s): HGBA1C in the last 72 hours. CBG: No results  for input(s): GLUCAP in the last 168 hours. Lipid Profile: No results for input(s): CHOL, HDL, LDLCALC, TRIG, CHOLHDL, LDLDIRECT in the last 72 hours. Thyroid Function Tests: No results for input(s): TSH, T4TOTAL, FREET4, T3FREE, THYROIDAB in the last 72 hours. Anemia Panel: No results for input(s): VITAMINB12, FOLATE, FERRITIN, TIBC, IRON, RETICCTPCT in the last 72 hours. Sepsis Labs: No results for input(s): PROCALCITON, LATICACIDVEN in the last 168 hours.  Recent Results (from the past 240 hour(s))  SARS Coronavirus 2 by RT PCR (hospital order, performed in [facility] hospital lab) Nasopharyngeal     Status: None   Collection Time: 05/25/20  1:00 AM   Specimen: Nasopharyngeal  Result Value Ref  Range Status   SARS Coronavirus 2 NEGATIVE NEGATIVE Final    Comment: (NOTE) SARS-CoV-2 target nucleic acids are NOT DETECTED.  The SARS-CoV-2 RNA is generally detectable in upper and lower respiratory specimens during the acute phase of infection. The lowest concentration of SARS-CoV-2 viral copies this assay can detect is 250 copies / mL. A negative result does not preclude SARS-CoV-2 infection and should not be used as the sole basis for treatment or other patient management decisions.  A negative result may occur with improper specimen collection / handling, submission of specimen other than nasopharyngeal swab, presence of viral mutation(s) within the areas targeted by this assay, and inadequate number of viral copies (<250 copies / mL). A negative result must be combined with clinical observations, patient history, and epidemiological information.  Fact Sheet for Patients:   BoilerBrush.com.cy  Fact Sheet for Healthcare Providers: https://pope.com/  This test is not yet approved or  cleared by the [place] FDA and has been authorized for detection and/or diagnosis of SARS-CoV-2 by FDA under an Emergency Use Authorization (EUA).   This EUA will remain in effect (meaning this test can be used) for the duration of the COVID-19 declaration under Section 564(b)(1) of the Act, 21 U.S.C. section 360bbb-3(b)(1), unless the authorization is terminated or revoked sooner.  Performed at [facility], [address], [place], [place] [address]      Radiology Studies: DG Cervical Spine 1 View  Result Date: 05/27/2020 CLINICAL DATA:  Cervical spine surgery EXAM: DG CERVICAL SPINE - 1 VIEW COMPARISON:  05/26/2020 FINDINGS: Patient is intubated. There are multiple metallic opacities that project over the neck including what are suspected to be surgical sponges anterior to the C7 level. IMPRESSION: Multiple metallic opacities that project over the neck including what are suspected to be surgical sponges anterior to the C7 level. Electronically Signed   By: Deatra Robinson M.D.   On: 05/27/2020 01:15   DG Cervical Spine 1 View  Result Date: 05/26/2020 CLINICAL DATA:  Surgery. EXAM: DG CERVICAL SPINE - 1 VIEW COMPARISON:  MRI earlier today. FINDINGS: Single lateral view of the cervical spine demonstrates surgical instrument localizing anteriorly at the level of C4-C5. IMPRESSION: Surgical instrument localizing anteriorly at the level of C4-C5. Electronically Signed   By: Narda Rutherford M.D.   On: 05/26/2020 23:06   MR CERVICAL SPINE WO CONTRAST  Addendum Date: 05/26/2020   ADDENDUM REPORT: 05/26/2020 19:25 ADDENDUM: These results were called by telephone at the time of interpretation on 05/26/2020 at 7:24 pm to provider Rathore , who verbally acknowledged these results. Electronically Signed   By: Marlan Palau M.D.   On: 05/26/2020 19:25   Result Date: 05/26/2020 CLINICAL DATA:  Cervical radiculopathy no red flags EXAM: MRI CERVICAL SPINE WITHOUT CONTRAST TECHNIQUE: Multiplanar, multisequence MR imaging of the cervical spine was performed. No intravenous contrast was administered. COMPARISON:  CT cervical spine 12/04/2018  FINDINGS: Alignment: Mild anterolisthesis C4-5.  2 mm anterolisthesis C7-T1. Vertebrae: Negative for fracture or mass. Cord: Cord compression at C7-T1 with cord hyperintensity which is ill-defined. This may be acute. Remainder of the cord signal is normal. Posterior Fossa, vertebral arteries, paraspinal tissues: Negative Disc levels: C2-3: Negative C3-4: Bilateral facet degeneration and mild disc degeneration. Mild foraminal stenosis bilaterally due to spurring. C4-5: Moderate facet degeneration bilaterally. Disc degeneration and spurring. Moderate foraminal encroachment bilaterally due to spurring. C5-6: Bilateral facet degeneration. Disc degeneration and diffuse uncinate spurring. Severe right foraminal encroachment and moderate to severe left foraminal encroachment  due to spurring. Mild spinal stenosis. C6-7: Disc degeneration with diffuse uncinate spurring. Bilateral facet hypertrophy. Mild spinal stenosis and moderate foraminal stenosis bilaterally. C7-T1: Large extruded disc fragment to the left of midline. There is cord compression and cord hyperintensity. There is bilateral facet hypertrophy and mild foraminal stenosis bilaterally. IMPRESSION: Multilevel degenerative change. Disc and facet degeneration and spurring causing foraminal encroachment at multiple levels as above. Large extruded disc fragment to the left of midline at C7-T1. There is cord compression and ill-defined cord hyperintensity which may be acute. Electronically Signed: By: Marlan Palauharles  Clark M.D. On: 05/26/2020 19:15   MR LUMBAR SPINE WO CONTRAST  Result Date: 05/26/2020 CLINICAL DATA:  Low back pain.  Progressive neurologic deficit. EXAM: MRI LUMBAR SPINE WITHOUT CONTRAST TECHNIQUE: Multiplanar, multisequence MR imaging of the lumbar spine was performed. No intravenous contrast was administered. COMPARISON:  None. FINDINGS: Segmentation:  Normal Alignment:  Mild anterolisthesis L3-4 L4-5 Vertebrae:  Normal bone marrow.  Negative for  fracture or mass. Conus medullaris and cauda equina: Conus extends to the L2 level. Conus and cauda equina appear normal. Paraspinal and other soft tissues: Negative for paraspinous mass or adenopathy. No soft tissue edema or fluid collection. Diffuse muscular atrophy. Disc levels: L1-2: Broad-based central disc protrusion and mild facet degeneration. Negative for stenosis L2-3: Mild disc bulging and moderate facet hypertrophy bilaterally. Mild subarticular stenosis bilaterally L3-4: Disc degeneration with diffuse bulging of the disc. Moderate to advanced facet hypertrophy bilaterally. Mild spinal stenosis and mild subarticular stenosis bilaterally. L4-5: Small right foraminal disc protrusion displacing the right L4 nerve root. Severe facet degeneration. Moderate spinal stenosis and moderate subarticular stenosis bilaterally. L5-S1: Small central disc protrusion and mild facet degeneration. Negative for spinal or foraminal stenosis. IMPRESSION: Multilevel degenerative change throughout the lumbar spine. Negative for lumbar fracture Mild spinal stenosis L3-4 with mild subarticular stenosis bilaterally. Moderate spinal stenosis at L4-5 with moderate subarticular stenosis bilaterally. Electronically Signed   By: Marlan Palauharles  Clark M.D.   On: 05/26/2020 18:58    Scheduled Meds: . benazepril  20 mg Oral Daily   And  . hydrochlorothiazide  25 mg Oral Daily  . carvedilol  6.25 mg Oral BID WC  . docusate sodium  100 mg Oral BID  . ferrous sulfate  325 mg Oral Q breakfast  . folic acid  1 mg Oral Daily  . furosemide  20 mg Oral Daily  . gabapentin  1,200 mg Oral QHS  . gabapentin  1,500 mg Oral Daily  . levothyroxine  125 mcg Oral Once per day on Tue Fri  . levothyroxine  150 mcg Oral Once per day on Sun Mon Wed Thu Sat  . [START ON 05/29/2020] pantoprazole  40 mg Oral Daily  . rosuvastatin  5 mg Oral Daily  . thiamine  100 mg Oral Daily   Continuous Infusions: . sodium chloride 250 mL (05/27/20 0621)  .  lactated ringers Stopped (05/28/20 0704)     LOS: 2 days   Rickey BarbaraStephen Janasia Coverdale, MD [facility] Pager On Amion  If 7PM-7AM, please contact night-coverage 05/28/2020, 5:48 PM

## 2020-05-28 NOTE — PMR Pre-admission (Signed)
PMR Admission Coordinator Pre-Admission Assessment  Patient: [name] is an 74 y.o., female MRN: [id number] DOB: 1945/12/11 Height: 5' 5.5" (166.4 cm) Weight: 102.1 kg  Insurance Information HMO:     PPO: yes     PCP:      IPA:      80/20:      OTHER:  PRIMARY: [employer]      Policy#: [id number]      Subscriber: pt CM Name: Manning Charity      Phone#: [phone]     Fax#: [phone] Pre-Cert#: [id number] approved for 7 days with f/u with [facility] phone ext [phone] fax [phone]      Employer:  Benefits:  Phone #: [phone]     Name: 7/2 Eff. Date: 12/28/2018     Deduct: $500      Out of Pocket Max: [phone]      Life Max: none CIR: $325 co pay per day days 1 until 6      SNF: no copay days 1 until 20; $184 co pay per day days 21 until 100 Outpatient: $15 to $40 per visit     Co-Pay: visits limited per medical neccesity Home Health: 1005      Co-Pay: visits per medical neccesity DME: 80%     Co-Pay: 205 Providers: in network  SECONDARY: none      Policy#:      Phone#:   Development worker, community:       Phone#:   [employer], [employer]" for patients in Inpatient Rehabilitation Facilities with attached "Privacy Act [place] Records" was provided and verbally reviewed with: Patient  Emergency Contact Information Contact Information    Name Relation Home Work House W Spouse [phone]  [phone]      Current Medical History  Patient Admitting Diagnosis: cord compression form C7-T1 disc fragment  History of Present Illness:  [name] is a 74 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  Presented 05/24/2020 left leg weakness.  Denies any recent fall.  No bowel or bladder disturbances.  Admission chemistries unremarkable except sodium 133, urinalysis  negative nitrite, hemoglobin 12.8.  CT/MRI of the brain negative for acute changes.  MRI of the spine showed multilevel degenerative changes throughout the lumbar spine negative for fracture.  Mild spinal stenosis L3-4 with mild subarticular stenosis bilaterally.  Moderate spinal stenosis L4-5.  Cervical MRI demonstrated a large herniated disc at C7-T1.  Patient underwent C7-T1 anterior cervical discectomy with decompression, interbody arthrodesis with insertion of interbody prosthesis 05/27/2020 per Dr. Arnoldo Morale.  Decadron protocol as indicated and course has been completed.  She was placed in a cervical collar.    Patient's medical record from [facility] has been reviewed by the rehabilitation admission coordinator and physician.  Past Medical History  Past Medical History:  Diagnosis Date  . Alcohol abuse 04/25/2015  . Allergy   . Anastomotic ulcer S/P gastric bypass 06/18/2016  . Arthritis   . Cataract   . Degenerative disc disease, cervical   . GAD (generalized anxiety disorder) 06/14/2016  . HTN (hypertension) 04/25/2015  . Hypertension   . Hypothyroidism   . 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 Substance abuse ([place])    Alcohol abuse 2016  . UGIB (upper gastrointestinal bleed) 04/25/2015    Family History  family history includes Breast cancer in her sister; Breast cancer (age of onset: 91) in her mother; COPD in her father; Cancer in her father and mother; Cancer (age of onset: 69) in her cousin; Diabetes in her father; Heart disease in her mother; Hypertension in her father and mother; Mental illness in her maternal grandmother; Stroke in her maternal grandmother; Stroke (age of onset: 63) in her mother.  Prior Rehab/Hospitalizations Has the patient had prior rehab or hospitalizations prior to admission? Yes  Has the patient had major surgery during 100 days prior to admission? Yes   Current Medications  Current  Facility-Administered Medications:  .  0.9 %  sodium chloride infusion, , Intravenous, PRN, Shela Leff, MD, Last Rate: 10 mL/hr at 05/27/20 0621, 250 mL at 05/27/20 0621 .  acetaminophen (TYLENOL) tablet 650 mg, 650 mg, Oral, Q4H PRN, 650 mg at 05/28/20 1328 **OR** acetaminophen (TYLENOL) suppository 650 mg, 650 mg, Rectal, Q4H PRN, Newman Pies, MD .  ALPRAZolam Duanne Moron) tablet 0.5 mg, 0.5 mg, Oral, PRN, Donne Hazel, MD, 0.5 mg at 05/26/20 1721 .  alum & mag hydroxide-simeth (MAALOX/MYLANTA) 200-200-20 MG/5ML suspension 30 mL, 30 mL, Oral, Q6H PRN, Newman Pies, MD .  benazepril (LOTENSIN) tablet 20 mg, 20 mg, Oral, Daily, 20 mg at 05/28/20 1019 **AND** hydrochlorothiazide (HYDRODIURIL) tablet 25 mg, 25 mg, Oral, Daily, Shela Leff, MD, 25 mg at 05/28/20 1019 .  bisacodyl (DULCOLAX) suppository 10 mg, 10 mg, Rectal, Daily PRN, Newman Pies, MD .  carvedilol (COREG) tablet 6.25 mg, 6.25 mg, Oral, BID WC, Newman Pies, MD, 6.25 mg at 05/28/20 0850 .  cyclobenzaprine (FLEXERIL) tablet 10 mg, 10 mg, Oral, TID PRN, Newman Pies, MD, 10 mg at 05/28/20 0228 .  docusate sodium (COLACE) capsule 100 mg, 100 mg, Oral, BID, Newman Pies, MD, 100 mg at 05/28/20 1103 .  ferrous sulfate tablet 325 mg, 325 mg, Oral, Q breakfast, Shela Leff, MD, 325 mg at 05/28/20 0850 .  folic acid (FOLVITE) tablet 1 mg, 1 mg, Oral, Daily, Shela Leff, MD, 1 mg at 05/27/20 0859 .  furosemide (LASIX) tablet 20 mg, 20 mg, Oral, Daily, Donne Hazel, MD, 20 mg at 05/28/20 1328 .  gabapentin (NEURONTIN) capsule 1,200 mg, 1,200 mg, Oral, QHS, Shela Leff, MD, 1,200 mg at 05/27/20 2120 .  gabapentin (NEURONTIN) capsule 1,500 mg, 1,500 mg, Oral, Daily, Shela Leff, MD, 1,500 mg at 05/28/20 1019 .  lactated ringers infusion, , Intravenous, Continuous, Newman Pies, MD, Stopped at 05/28/20 [phone] .  levothyroxine (SYNTHROID) tablet 125 mcg, 125 mcg, Oral, Once per  day on Tue Fri, Rathore, Wandra Feinstein, MD, 125 mcg at 05/28/20 0650 .  levothyroxine (SYNTHROID) tablet 150 mcg, 150 mcg, Oral, Once per day on Sun Mon Wed Thu Sat, Marlowe Sax, Wandra Feinstein, MD, 150 mcg at 05/27/20 1884 .  menthol-cetylpyridinium (CEPACOL) lozenge 3 mg, 1 lozenge, Oral, PRN **OR** [DISCONTINUED] phenol (CHLORASEPTIC) mouth spray 1 spray, 1 spray, Mouth/Throat, PRN, Newman Pies, MD .  morphine 4 MG/ML injection 4 mg, 4 mg, Intravenous, Q2H PRN, Newman Pies, MD, 4 mg at 05/27/20 0531 .  ondansetron (ZOFRAN) tablet 4 mg, 4 mg, Oral, Q6H PRN **OR** ondansetron (ZOFRAN) injection 4 mg, 4 mg, Intravenous, Q6H PRN, Newman Pies, MD .  oxyCODONE (Oxy IR/ROXICODONE) immediate release tablet 10 mg, 10 mg, Oral, Q3H PRN, Newman Pies, MD, 10 mg at 05/28/20 1327 .  oxyCODONE (Oxy IR/ROXICODONE) immediate release tablet 5 mg, 5 mg, Oral, Q3H PRN, Newman Pies, MD, 5 mg at 05/27/20 1829 .  [  START ON 05/29/2020] pantoprazole (PROTONIX) EC tablet 40 mg, 40 mg, Oral, Daily, Joselyn Glassman A, RPH .  rosuvastatin (CRESTOR) tablet 5 mg, 5 mg, Oral, Daily, Shela Leff, MD, 5 mg at 05/28/20 1019 .  thiamine tablet 100 mg, 100 mg, Oral, Daily, 100 mg at 05/27/20 0857 **OR** [DISCONTINUED] thiamine (B-1) injection 100 mg, 100 mg, Intravenous, Daily, Shela Leff, MD .  traMADol Veatrice Bourbon) tablet 50 mg, 50 mg, Oral, Daily PRN, Donne Hazel, MD, 50 mg at 05/28/20 0902 .  zolpidem (AMBIEN) tablet 5 mg, 5 mg, Oral, QHS PRN, Newman Pies, MD  Patients Current Diet:  Diet Order            Diet Heart Room service appropriate? Yes; Fluid consistency: Thin  Diet effective now                 Precautions / Restrictions Precautions Precautions: Fall, Cervical Precaution Booklet Issued: No Precaution Comments: Verbally reviewed cervical precautions.  Restrictions Weight Bearing Restrictions: No   Has the patient had 2 or more falls or a fall with injury in the past year?  Yes  Prior Activity Level Limited Community (1-2x/wk): Mod I with cane in the community. No AD in home  Prior Functional Level Self Care: Did the patient need help bathing, dressing, using the toilet or eating? Independent  Indoor Mobility: Did the patient need assistance with walking from room to room (with or without device)? Independent  Stairs: Did the patient need assistance with internal or external stairs (with or without device)? Needed some help  Functional Cognition: Did the patient need help planning regular tasks such as shopping or remembering to take medications? [place] Devices/Equipment: Cane (specify quad or straight) Home Equipment: Walker - 2 wheels, Cane - single point, Bedside commode  Prior Device Use: Indicate devices/aids used by the patient prior to current illness, exacerbation or injury? cane  Current Functional Level Cognition  Overall Cognitive Status: Within Functional Limits for tasks assessed Orientation Level: Oriented X4    Extremity Assessment (includes Sensation/Coordination)  Upper Extremity Assessment: Generalized weakness (but overall functional; no sensory issues/resolved per pt) LUE Deficits / Details: pt reports a sudden sharp pain that she can only get relief from by shoulder flexion, abdution with hand behind head pushing intoward on her head and head on her hand. pt reports "this is starting to not give me the relief now" pt reports sometimes on R but always on L. This occurred only after static standing LUE Coordination: WNL  Lower Extremity Assessment: Defer to PT evaluation RLE Deficits / Details: noted to have redness below knee and edema present. pt reports that redness is not new but the swelling is. pt reports sensation change is new LLE Deficits / Details: redness to skin below knee with edema present. pt reports decr sensation. pt reports that L feels different than R. pt  states "your glove feels smooth on my right leg but it feels rough to touch on my left left" Noted functional weakness as increased L knee buckling when standing.  LLE Sensation: decreased light touch    ADLs  Overall ADL's : Needs assistance/impaired Eating/Feeding: Modified independent, Bed level Grooming: Sitting, Minimal assistance Grooming Details (indicate cue type and reason): to reach hair Upper Body Bathing: Set up, Sitting Lower Body Bathing: Moderate assistance, Sit to/from stand Upper Body Dressing : Minimal assistance, Sitting Upper Body Dressing Details (indicate cue type and reason): A to donn/doff cervical collar Lower  Body Dressing: Maximal assistance, Sit to/from stand Toilet Transfer: Moderate assistance, +2 for physical assistance, Stand-pivot, BSC, RW Toilet Transfer Details (indicate cue type and reason): ablet o take several pivotal steps to [facility] Toileting- Clothing Manipulation and Hygiene: Total assistance, Sit to/from stand Toileting - Clothing Manipulation Details (indicate cue type and reason): unable to release RW for pericare Functional mobility during ADLs: Rolling walker, Cueing for sequencing, Cueing for safety, +2 for physical assistance, Moderate assistance General ADL Comments: Pt normally bends forward tfor LB ADL; will need to use AE; unablet o complete figure four positioning    Mobility  Overal bed mobility: Needs Assistance Bed Mobility: Rolling, Sidelying to Sit Rolling: Min assist Sidelying to sit: Mod assist Supine to sit: Min assist Sit to supine: +2 for physical assistance, Max assist General bed mobility comments: Min A for assist with rolling and mod A for trunk elevation.     Transfers  Overall transfer level: Needs assistance Equipment used: Rolling walker (2 wheeled) Transfers: Sit to/from Stand, [employer] Transfers Sit to Stand: Mod assist, +2 physical assistance, From elevated surface Stand pivot transfers: Min assist, +2  physical assistance General transfer comment: VC for correct hand placement and positioning in RW due to LLE weakness/hx of buckling    Ambulation / Gait / Stairs / Wheelchair Mobility  Ambulation/Gait Ambulation/Gait assistance: Mod assist, Min assist, +2 physical assistance Gait Distance (Feet): 2 Feet Assistive device: Rolling walker (2 wheeled) Gait Pattern/deviations: Step-to pattern, Decreased step length - right, Decreased step length - left, Decreased weight shift to left General Gait Details: Took steps from [facility] to chair using RW. Cues for appropriate use of RW. Noted instability of L knee. Required min to mod A for steadying.  Gait velocity: Decreased    Posture / Balance Balance Overall balance assessment: Needs assistance Sitting-balance support: No upper extremity supported Sitting balance-Leahy Scale: Fair Standing balance support: Bilateral upper extremity supported Standing balance-Leahy Scale: Poor Standing balance comment: reliant on BUE support and external support    Special needs/care consideration Designate visitors are spouse, Dominica Severin and daughter, Barbera Setters   Previous Home Environment  Living Arrangements: Spouse/significant other  Lives With: Spouse Available Help at Discharge: Family, Available 24 hours/day Type of Home: House Home Layout: Two level, 1/2 bath on main level, Bed/bath upstairs Home Access: Stairs to enter Entrance Stairs-Rails: Right Entrance Stairs-Number of Steps: 2 Bathroom Shower/Tub: Multimedia programmer: Standard Bathroom Accessibility: Yes How Accessible: Accessible via walker [place]: No Additional Comments: reports that she has been sleeping on the main level due to increased difficulty with the stairs. the couch can turn into a bed   Discharge Living Setting Plans for Discharge Living Setting: Patient's home, Lives with (comment) (spouse) Type of Home at Discharge: House Discharge Home Layout: Two level, 1/2  bath on main level, Bed/bath upstairs (has been sleeping on couch on main level) Discharge Home Access: Stairs to enter Entrance Stairs-Rails: Right Entrance Stairs-Number of Steps: 2 Discharge Bathroom Shower/Tub: Walk-in shower Discharge Bathroom Toilet: Standard Discharge Bathroom Accessibility: Yes How Accessible: Accessible via walker Does the patient have any problems obtaining your medications?: No  Social/Family/Support Systems Patient Roles: Spouse Contact Information: spouse, Dominica Severin Anticipated Caregiver: spouse Anticipated Ambulance person Information: see above Ability/Limitations of Caregiver: none Caregiver Availability: 24/7 Discharge Plan Discussed with Primary Caregiver: Yes Is Caregiver In Agreement with Plan?: Yes Does Caregiver/Family have Issues with Lodging/Transportation while Pt is in Rehab?: No  Goals Patient/Family Goal for Rehab: Mod I to supervision with  PT and OT Expected length of stay: ELOS 7 to 10 days Pt/Family Agrees to Admission and willing to participate: Yes Program Orientation Provided & Reviewed with Pt/Caregiver Including Roles  & Responsibilities: Yes  Decrease burden of Care through IP rehab admission: n/a  Possible need for SNF placement upon discharge: not anticipated  Patient Condition: I have reviewed medical records from [facility] , spoken with CM, and patient. I met with patient at the bedside for inpatient rehabilitation assessment.  Patient will benefit from ongoing PT and OT, can actively participate in 3 hours of therapy a day 5 days of the week, and can make measurable gains during the admission.  Patient will also benefit from the coordinated team approach during an Inpatient Acute Rehabilitation admission.  The patient will receive intensive therapy as well as Rehabilitation physician, nursing, social worker, and care management interventions.  Due to bladder management, bowel management, safety, skin/wound care, disease  management, medication administration, pain management and patient education the patient requires 24 hour a day rehabilitation nursing.  The patient is currently mod assist overall with mobility and basic ADLs.  Discharge setting and therapy post discharge at home with home health is anticipated.  Patient has agreed to participate in the Acute Inpatient Rehabilitation Program and will admit tomorrow 05/29/2020 when bed is available.  Preadmission Screen Completed By:  Cleatrice Burke, 05/28/2020 3:26 PM ______________________________________________________________________   Discussed status with Dr. Letta Pate on 05/28/2020 at 1343 and received approval for admission 05/29/2020 when bed is available.  Admission Coordinator:  Cleatrice Burke, RN, time  8295 Date 05/28/2020   Assessment/Plan: Diagnosis:Cervical myelopathy 1. Does the need for close, 24 hr/day Medical supervision in concert with the patient's rehab needs make it unreasonable for this patient to be served in a less intensive setting? Yes 2. Co-Morbidities requiring supervision/potential complications: Bilateral hip osteoarthritis, d CHF, HTN, Morbid obesity  3. Due to bladder management, bowel management, safety, skin/wound care, disease management, medication administration, pain management and patient education, does the patient require 24 hr/day rehab nursing? Yes 4. Does the patient require coordinated care of a physician, rehab nurse, PT, OT, and SLP to address physical and functional deficits in the context of the above medical diagnosis(es)? Yes Addressing deficits in the following areas: balance, endurance, locomotion, strength, transferring, bowel/bladder control, bathing, dressing, feeding, grooming, toileting and psychosocial support 5. Can the patient actively participate in an intensive therapy program of at least 3 hrs of therapy 5 days a week? Yes 6. The potential for patient to make measurable gains while on  inpatient rehab is good 7. Anticipated functional outcomes upon discharge from inpatient rehab: supervision and min assist PT, supervision and min assist OT, n/a SLP 8. Estimated rehab length of stay to reach the above functional goals is: 7-10dd 9. Anticipated discharge destination: Home 10. Overall Rehab/Functional Prognosis: good   MD Signature: Charlett Blake M.D. [facility] FAAPM&R (Neuromuscular Med) Diplomate Am Board of Electrodiagnostic Med Fellow Am Board of Interventional Pain

## 2020-05-28 NOTE — Progress Notes (Signed)
Inpatient Rehabilitation Admissions Coordinator  I have insurance approval to admit to Cir and bed is available Saturday. I met with patient and her spouse at bedside and they are in agreement.  Saturday at 12 noon 3 west RN can call charge nurse at [phone] to give report and arrange admit. Dr. Kirsteins will give final rehab MD approval in the morning. I will make the arrangements to  admit Saturday when bed is available.  Barbara Boyette, RN, MSN Rehab Admissions Coordinator [phone] 05/28/2020 1:06 PM  

## 2020-05-29 ENCOUNTER — Encounter (HOSPITAL_COMMUNITY): Payer: Self-pay | Admitting: Physical Medicine & Rehabilitation

## 2020-05-29 ENCOUNTER — Inpatient Hospital Stay (HOSPITAL_COMMUNITY)
Admission: RE | Admit: 2020-05-29 | Discharge: 2020-06-10 | DRG: 092 | Disposition: A | Discharge status: Home Health | Payer: Medicare PPO | Source: Intra-hospital | Attending: Physical Medicine and Rehabilitation | Admitting: Physical Medicine and Rehabilitation

## 2020-05-29 ENCOUNTER — Other Ambulatory Visit: Payer: Self-pay

## 2020-05-29 DIAGNOSIS — E46 Unspecified protein-calorie malnutrition: Secondary | ICD-10-CM | POA: Diagnosis not present

## 2020-05-29 DIAGNOSIS — I10 Essential (primary) hypertension: Secondary | ICD-10-CM

## 2020-05-29 DIAGNOSIS — G8918 Other acute postprocedural pain: Secondary | ICD-10-CM | POA: Diagnosis not present

## 2020-05-29 DIAGNOSIS — Z5329 Procedure and treatment not carried out because of patient's decision for other reasons: Secondary | ICD-10-CM | POA: Diagnosis not present

## 2020-05-29 DIAGNOSIS — Z91018 Allergy to other foods: Secondary | ICD-10-CM

## 2020-05-29 DIAGNOSIS — R2689 Other abnormalities of gait and mobility: Principal | ICD-10-CM | POA: Diagnosis present

## 2020-05-29 DIAGNOSIS — Z7141 Alcohol abuse counseling and surveillance of alcoholic: Secondary | ICD-10-CM

## 2020-05-29 DIAGNOSIS — Z7289 Other problems related to lifestyle: Secondary | ICD-10-CM | POA: Diagnosis not present

## 2020-05-29 DIAGNOSIS — G9520 Unspecified cord compression: Secondary | ICD-10-CM | POA: Diagnosis not present

## 2020-05-29 DIAGNOSIS — D7589 Other specified diseases of blood and blood-forming organs: Secondary | ICD-10-CM

## 2020-05-29 DIAGNOSIS — Z882 Allergy status to sulfonamides status: Secondary | ICD-10-CM | POA: Diagnosis not present

## 2020-05-29 DIAGNOSIS — Z79899 Other long term (current) drug therapy: Secondary | ICD-10-CM | POA: Diagnosis not present

## 2020-05-29 DIAGNOSIS — M5000 Cervical disc disorder with myelopathy, unspecified cervical region: Secondary | ICD-10-CM

## 2020-05-29 DIAGNOSIS — I110 Hypertensive heart disease with heart failure: Secondary | ICD-10-CM | POA: Diagnosis present

## 2020-05-29 DIAGNOSIS — N319 Neuromuscular dysfunction of bladder, unspecified: Secondary | ICD-10-CM | POA: Diagnosis present

## 2020-05-29 DIAGNOSIS — E039 Hypothyroidism, unspecified: Secondary | ICD-10-CM | POA: Diagnosis present

## 2020-05-29 DIAGNOSIS — Z8249 Family history of ischemic heart disease and other diseases of the circulatory system: Secondary | ICD-10-CM

## 2020-05-29 DIAGNOSIS — Z79890 Hormone replacement therapy (postmenopausal): Secondary | ICD-10-CM

## 2020-05-29 DIAGNOSIS — M7989 Other specified soft tissue disorders: Secondary | ICD-10-CM | POA: Diagnosis not present

## 2020-05-29 DIAGNOSIS — Z9102 Food additives allergy status: Secondary | ICD-10-CM

## 2020-05-29 DIAGNOSIS — Z9884 Bariatric surgery status: Secondary | ICD-10-CM

## 2020-05-29 DIAGNOSIS — Z888 Allergy status to other drugs, medicaments and biological substances status: Secondary | ICD-10-CM | POA: Diagnosis not present

## 2020-05-29 DIAGNOSIS — Z981 Arthrodesis status: Secondary | ICD-10-CM

## 2020-05-29 DIAGNOSIS — F411 Generalized anxiety disorder: Secondary | ICD-10-CM | POA: Diagnosis present

## 2020-05-29 DIAGNOSIS — E669 Obesity, unspecified: Secondary | ICD-10-CM | POA: Diagnosis present

## 2020-05-29 DIAGNOSIS — I5032 Chronic diastolic (congestive) heart failure: Secondary | ICD-10-CM | POA: Diagnosis not present

## 2020-05-29 DIAGNOSIS — E782 Mixed hyperlipidemia: Secondary | ICD-10-CM

## 2020-05-29 DIAGNOSIS — F419 Anxiety disorder, unspecified: Secondary | ICD-10-CM | POA: Diagnosis not present

## 2020-05-29 DIAGNOSIS — Z79891 Long term (current) use of opiate analgesic: Secondary | ICD-10-CM

## 2020-05-29 DIAGNOSIS — G629 Polyneuropathy, unspecified: Secondary | ICD-10-CM | POA: Diagnosis not present

## 2020-05-29 DIAGNOSIS — E8809 Other disorders of plasma-protein metabolism, not elsewhere classified: Secondary | ICD-10-CM | POA: Diagnosis not present

## 2020-05-29 DIAGNOSIS — I82453 Acute embolism and thrombosis of peroneal vein, bilateral: Secondary | ICD-10-CM | POA: Diagnosis not present

## 2020-05-29 DIAGNOSIS — Z6836 Body mass index (BMI) 36.0-36.9, adult: Secondary | ICD-10-CM

## 2020-05-29 DIAGNOSIS — Z9049 Acquired absence of other specified parts of digestive tract: Secondary | ICD-10-CM

## 2020-05-29 DIAGNOSIS — I82409 Acute embolism and thrombosis of unspecified deep veins of unspecified lower extremity: Secondary | ICD-10-CM | POA: Diagnosis not present

## 2020-05-29 DIAGNOSIS — E785 Hyperlipidemia, unspecified: Secondary | ICD-10-CM | POA: Diagnosis present

## 2020-05-29 DIAGNOSIS — K5903 Drug induced constipation: Secondary | ICD-10-CM | POA: Diagnosis not present

## 2020-05-29 DIAGNOSIS — G8929 Other chronic pain: Secondary | ICD-10-CM

## 2020-05-29 DIAGNOSIS — M549 Dorsalgia, unspecified: Secondary | ICD-10-CM

## 2020-05-29 MED ORDER — ACETAMINOPHEN 650 MG RE SUPP: 650.0000 mg | RECTAL | Status: DC | PRN

## 2020-05-29 MED ORDER — THIAMINE HCL 100 MG PO TABS
100.0000 mg | ORAL_TABLET | Freq: Every day | ORAL | Status: DC
  Filled 2020-05-29 (×9): qty 1

## 2020-05-29 MED ORDER — FOLIC ACID 1 MG PO TABS
1.0000 mg | ORAL_TABLET | Freq: Every day | ORAL | Status: DC
  Filled 2020-05-29 (×9): qty 1

## 2020-05-29 MED ORDER — DSS 100 MG PO CAPS: 100.0000 mg | ORAL_CAPSULE | Freq: Two times a day (BID) | ORAL | 0 refills | Status: DC

## 2020-05-29 MED ORDER — BENAZEPRIL HCL 20 MG PO TABS
20.0000 mg | ORAL_TABLET | Freq: Every day | ORAL | Status: DC
  Administered 2020-05-30 – 2020-06-01 (×3): 20 mg via ORAL
  Filled 2020-05-29 (×3): qty 1

## 2020-05-29 MED ORDER — ONDANSETRON HCL 4 MG/2ML IJ SOLN: 4.0000 mg | Freq: Four times a day (QID) | INTRAMUSCULAR | Status: DC | PRN

## 2020-05-29 MED ORDER — LEVOTHYROXINE SODIUM 75 MCG PO TABS
150.0000 ug | ORAL_TABLET | ORAL | Status: DC
  Administered 2020-05-30 – 2020-06-10 (×9): 150 ug via ORAL
  Filled 2020-05-29 (×11): qty 2

## 2020-05-29 MED ORDER — HYDROCHLOROTHIAZIDE 25 MG PO TABS
25.0000 mg | ORAL_TABLET | Freq: Every day | ORAL | Status: DC
  Administered 2020-05-30 – 2020-06-01 (×3): 25 mg via ORAL
  Filled 2020-05-29 (×3): qty 1

## 2020-05-29 MED ORDER — CARVEDILOL 6.25 MG PO TABS
6.2500 mg | ORAL_TABLET | Freq: Two times a day (BID) | ORAL | Status: DC
  Administered 2020-05-29: 6.25 mg via ORAL
  Filled 2020-05-29: qty 1

## 2020-05-29 MED ORDER — GABAPENTIN 400 MG PO CAPS
1500.0000 mg | ORAL_CAPSULE | Freq: Every day | ORAL | Status: DC
  Administered 2020-05-30 – 2020-06-10 (×12): 1500 mg via ORAL
  Filled 2020-05-29 (×12): qty 3

## 2020-05-29 MED ORDER — OXYCODONE HCL 5 MG PO TABS: 5.0000 mg | ORAL_TABLET | ORAL | 0 refills | Status: DC | PRN

## 2020-05-29 MED ORDER — GABAPENTIN 400 MG PO CAPS
1200.0000 mg | ORAL_CAPSULE | Freq: Every day | ORAL | Status: DC
  Administered 2020-05-29 – 2020-06-09 (×12): 1200 mg via ORAL
  Filled 2020-05-29 (×4): qty 3
  Filled 2020-05-29: qty 12
  Filled 2020-05-29 (×5): qty 3
  Filled 2020-05-29: qty 12
  Filled 2020-05-29: qty 3

## 2020-05-29 MED ORDER — ONDANSETRON HCL 4 MG PO TABS
4.0000 mg | ORAL_TABLET | Freq: Four times a day (QID) | ORAL | Status: DC | PRN
  Administered 2020-06-03: 4 mg via ORAL
  Filled 2020-05-29: qty 1

## 2020-05-29 MED ORDER — PANTOPRAZOLE SODIUM 40 MG PO TBEC
40.0000 mg | DELAYED_RELEASE_TABLET | Freq: Every day | ORAL | Status: DC
  Administered 2020-05-30 – 2020-06-10 (×12): 40 mg via ORAL
  Filled 2020-05-29 (×12): qty 1

## 2020-05-29 MED ORDER — BISACODYL 10 MG RE SUPP: 10.0000 mg | Freq: Every day | RECTAL | Status: DC | PRN

## 2020-05-29 MED ORDER — FERROUS SULFATE 325 (65 FE) MG PO TABS
325.0000 mg | ORAL_TABLET | Freq: Every day | ORAL | Status: DC
  Administered 2020-05-30 – 2020-06-10 (×12): 325 mg via ORAL
  Filled 2020-05-29 (×12): qty 1

## 2020-05-29 MED ORDER — OXYCODONE HCL 10 MG PO TABS: 10.0000 mg | ORAL_TABLET | ORAL | 0 refills | Status: DC | PRN

## 2020-05-29 MED ORDER — ACETAMINOPHEN 325 MG PO TABS
650.0000 mg | ORAL_TABLET | ORAL | Status: DC | PRN
  Administered 2020-05-30 – 2020-06-09 (×14): 650 mg via ORAL
  Filled 2020-05-29 (×13): qty 2

## 2020-05-29 MED ORDER — TRAMADOL HCL 50 MG PO TABS
50.0000 mg | ORAL_TABLET | Freq: Every day | ORAL | Status: DC | PRN
  Administered 2020-05-30 – 2020-06-10 (×11): 50 mg via ORAL
  Filled 2020-05-29 (×11): qty 1

## 2020-05-29 MED ORDER — ALPRAZOLAM 0.5 MG PO TABS
0.5000 mg | ORAL_TABLET | ORAL | Status: DC | PRN
  Administered 2020-05-29 – 2020-06-08 (×5): 0.5 mg via ORAL
  Filled 2020-05-29 (×5): qty 1

## 2020-05-29 MED ORDER — LEVOTHYROXINE SODIUM 25 MCG PO TABS
125.0000 ug | ORAL_TABLET | ORAL | Status: DC
  Administered 2020-06-01 – 2020-06-08 (×3): 125 ug via ORAL
  Filled 2020-05-29 (×2): qty 1

## 2020-05-29 MED ORDER — CYCLOBENZAPRINE HCL 10 MG PO TABS
10.0000 mg | ORAL_TABLET | Freq: Three times a day (TID) | ORAL | Status: DC | PRN
  Administered 2020-05-29 – 2020-06-09 (×17): 10 mg via ORAL
  Filled 2020-05-29 (×17): qty 1

## 2020-05-29 MED ORDER — SORBITOL 70 % SOLN: 30.0000 mL | Freq: Every day | Status: DC | PRN

## 2020-05-29 MED ORDER — OXYCODONE HCL 5 MG PO TABS
10.0000 mg | ORAL_TABLET | ORAL | Status: DC | PRN
  Administered 2020-05-29 – 2020-06-09 (×26): 10 mg via ORAL
  Filled 2020-05-29 (×26): qty 2

## 2020-05-29 MED ORDER — DOCUSATE SODIUM 100 MG PO CAPS
100.0000 mg | ORAL_CAPSULE | Freq: Two times a day (BID) | ORAL | Status: DC
  Administered 2020-05-29 – 2020-06-04 (×9): 100 mg via ORAL
  Filled 2020-05-29 (×16): qty 1

## 2020-05-29 MED ORDER — OXYCODONE HCL 5 MG PO TABS
5.0000 mg | ORAL_TABLET | ORAL | Status: DC | PRN
  Administered 2020-06-08 – 2020-06-10 (×2): 5 mg via ORAL
  Filled 2020-05-29 (×2): qty 1

## 2020-05-29 MED ORDER — ZOLPIDEM TARTRATE 5 MG PO TABS: 5.0000 mg | ORAL_TABLET | Freq: Every evening | ORAL | 0 refills | Status: DC | PRN

## 2020-05-29 MED ORDER — ROSUVASTATIN CALCIUM 5 MG PO TABS
5.0000 mg | ORAL_TABLET | Freq: Every day | ORAL | Status: DC
  Administered 2020-05-30 – 2020-06-10 (×12): 5 mg via ORAL
  Filled 2020-05-29 (×12): qty 1

## 2020-05-29 NOTE — Progress Notes (Signed)
Subjective: Patient reports NAEs  Objective: Vital signs in last 24 hours: Temp:  [97.5 F (36.4 C)-98.3 F (36.8 C)] 98.3 F (36.8 C) (07/03 0814) Pulse Rate:  [76-83] 79 (07/03 0814) Resp:  [16-18] 16 (07/03 0814) BP: (99-125)/(48-64) 125/61 (07/03 0814) SpO2:  [94 %-96 %] 96 % (07/03 0814)  Intake/Output from previous day: 07/02 0701 - 07/03 0700 In: 0  Out: 425 [Urine:425] Intake/Output this shift: No intake/output data recorded.  A+Ox3, NAD Left leg 3/5 strength improved from postop Incision c/d/i  Lab Results: Recent Labs    05/26/20 2103  WBC 5.6  HGB 12.8  HCT 38.5  PLT 215   BMET Recent Labs    05/26/20 2103  NA 136  K 4.0  CL 103  CO2 25  GLUCOSE 119*  BUN 16  CREATININE 0.82  CALCIUM 8.8*    Studies/Results: No results found.  Assessment/Plan: S/p C7-T1 ACDF - likely d/c to rehab today   [name] 05/29/2020, 10:07 AM

## 2020-05-29 NOTE — H&P (Signed)
Physical Medicine and Rehabilitation Admission H&P        Chief Complaint  Patient presents with  . 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  Per chart review patient lives with spouse.  Two-level home half bath on main level bed and bath upstairs and 2 steps to entry.  She walks with a cane outside the home.  Husband does assist with some basic ADLs.  Presented 05/24/2020 left leg weakness.  Denies any recent fall.  No bowel or bladder disturbances.  Admission chemistries unremarkable except sodium 133, urinalysis negative nitrite, hemoglobin 12.8.  CT/MRI of the brain negative for acute changes.  MRI of the spine showed multilevel degenerative changes throughout the lumbar spine negative for fracture.  Mild spinal stenosis L3-4 with mild subarticular stenosis bilaterally.  Moderate spinal stenosis L4-5.  Cervical MRI demonstrated a large herniated disc at C7-T1.  Patient underwent C7-T1 anterior cervical discectomy with decompression, interbody arthrodesis with insertion of interbody prosthesis 05/27/2020 per Dr. Lovell Sheehan.  Decadron protocol as indicated and course has been completed.  She was placed in a cervical collar.  Therapy evaluations completed and patient was admitted for a comprehensive rehab program.   Review of Systems  Constitutional: Negative for chills and fever.  HENT: Negative for hearing loss.   Eyes: Negative for blurred vision and double vision.  Respiratory: Negative for cough and shortness of breath.   Cardiovascular: Negative for chest pain, palpitations and leg swelling.  Gastrointestinal: Positive for constipation. Negative for heartburn, nausea and vomiting.  Genitourinary: Negative for dysuria, flank pain and hematuria.  Musculoskeletal: Positive for joint pain and  myalgias.  Skin: Negative for rash.  Neurological: Positive for weakness.  Psychiatric/Behavioral:       Anxiety  All other systems reviewed and are negative.       Past Medical History:  Diagnosis Date  . Alcohol abuse 04/25/2015  . Allergy    . Anastomotic ulcer S/P gastric bypass 06/18/2016  . Arthritis    . Cataract    . Degenerative disc disease, cervical    . GAD (generalized anxiety disorder) 06/14/2016  . HTN (hypertension) 04/25/2015  . Hypertension    . Hypothyroidism    . 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 Substance abuse (HCC)      Alcohol abuse 2016  . UGIB (upper gastrointestinal bleed) 04/25/2015         Past Surgical History:  Procedure Laterality Date  . CHOLECYSTECTOMY      . COSMETIC SURGERY      . DIAGNOSTIC LAPAROSCOPY      . ESOPHAGOGASTRODUODENOSCOPY N/A 04/25/2015    Procedure: ESOPHAGOGASTRODUODENOSCOPY (EGD);  Surgeon: Beverley Fiedler, MD;  Location: [facility]COPY;  Service: Endoscopy;  Laterality: N/A;  . FRACTURE SURGERY      . GASTRIC BYPASS      . NM MYOVIEW LTD   10/10/2017    Normal LV size and function EF 72%.  Medium sized mild severity defect in the apical septal apical lateral and apical wall suggestive of breast attenuation.  LOW RISK.  No ischemia or infarction.    . OPEN REDUCTION INTERNAL FIXATION (ORIF) DISTAL RADIAL FRACTURE Right 01/15/2013    Procedure: OPEN REDUCTION INTERNAL FIXATION (ORIF) Right DISTAL RADIUS FRACTURE;  Surgeon: Eldred Manges,  MD;  Location: [facility] OR;  Service: Orthopedics;  Laterality: Right;  . TRANSTHORACIC ECHOCARDIOGRAM   10/04/2017    Mild LVH.  EF 60-65%.  Grade 2/moderate diastolic dysfunction.  Mild pulmonary hypertension.  No valve lesions         Family History  Problem Relation Age of Onset  . Heart disease Mother    . Stroke Mother 49  . Cancer Mother          skin and breast  . Hypertension Mother    . Breast cancer Mother 60  . COPD Father    .  Cancer Father    . Diabetes Father    . Hypertension Father    . Stroke Maternal Grandmother    . Mental illness Maternal Grandmother    . Breast cancer Sister    . Cancer Cousin 77    Social History:  reports that she has never smoked. She has never used smokeless tobacco. She reports current alcohol use of about 28.0 standard drinks of alcohol per week. She reports that she does not use drugs. Allergies:       Allergies  Allergen Reactions  . Cocoa Anaphylaxis  . Red Dye Hives      Rash and Nausea diarrhea  . Lyrica [Pregabalin] Other (See Comments)      Abnormal bleeding  . Other Other (See Comments)      Berries : Rash  . Sulfa Antibiotics Rash          Medications Prior to Admission  Medication Sig Dispense Refill  . acetaminophen (TYLENOL) 500 MG tablet Take 500 mg by mouth every 6 (six) hours as needed for moderate pain. Take 1 to 2 tablets daily      . benazepril-hydrochlorthiazide (LOTENSIN HCT) 20-25 MG tablet TAKE 1 TABLET BY MOUTH DAILY. 90 tablet 1  . carvedilol (COREG) 6.25 MG tablet TAKE 1 TABLET BY MOUTH 2 TIMES DAILY WITH A MEAL. (Patient taking differently: Take 6.25 mg by mouth 2 (two) times daily with a meal. ) 180 tablet 3  . Cholecalciferol (VITAMIN D3) 125 MCG (5000 UT) TABS Take 15,000 Units by mouth See admin instructions. Takes 16109 units 5 days a week and 20000 units on 2 days.      . Cyanocobalamin (VITAMIN B 12 PO) Take 2,500 mg by mouth once a week.      . cyclobenzaprine (FLEXERIL) 10 MG tablet TAKE 1 TABLET BY MOUTH 3 TIMES DAILY AS NEEDED FOR MUSCLE SPASMS. (Patient taking differently: Take 10 mg by mouth daily as needed for muscle spasms. Marland Kitchen) 30 tablet 0  . diphenhydrAMINE (BENADRYL) 25 MG tablet Take 25 mg by mouth at bedtime as needed for sleep.       . ferrous sulfate 325 (65 FE) MG tablet Take 325 mg by mouth daily with breakfast.      . furosemide (LASIX) 40 MG tablet Take 1 tablet (40 mg total) by mouth daily. (Patient taking differently: Take  20-40 mg by mouth See admin instructions. Takes  twice a week and 20 mg on other days , depends on weight and water intake.) 90 tablet 1  . gabapentin (NEURONTIN) 300 MG capsule Take 300 mg by mouth See admin instructions. Take 5 capsules in the morning and 4 capsules at bedtime.       Marland Kitchen HYDROcodone-acetaminophen (NORCO) 10-325 MG tablet Take 0.5 tablets by mouth every 6 (six) hours as needed for moderate pain.       Marland Kitchen levothyroxine (SYNTHROID) 125 MCG  tablet Only take 1 tab Tues and Fridays (Patient taking differently: Take 125 mcg by mouth See admin instructions. Take 1 tablet by mouth on Tuesday and Fridays) 25 tablet 0  . levothyroxine (SYNTHROID) 150 MCG tablet everyday except Tues and Friday- take those days (Patient taking differently: Take 150 mcg by mouth See admin instructions. Takes everyday except Tues and Friday) 60 tablet 0  . Melatonin 5 MG CAPS Take 10 mg by mouth at bedtime.       . meloxicam (MOBIC) 15 MG tablet TAKE 1 TABLET BY MOUTH DAILY. (Patient taking differently: Take 15 mg by mouth daily. ) 90 tablet 1  . pantoprazole (PROTONIX) 40 MG tablet Take 1 tablet (40 mg total) by mouth daily. 90 tablet 0  . rosuvastatin (CRESTOR) 5 MG tablet Take 1 tablet (5 mg total) by mouth daily. 90 tablet 1  . traMADol (ULTRAM) 50 MG tablet TAKE 1 TABLET BY MOUTH 2 TIMES DAILY. MAXIMUM 6 TABLETS PER DAY AS DIRECTED BY PRESCRIBER (Patient taking differently: Take 50 mg by mouth daily as needed for moderate pain. ) 60 tablet 0  . cyanocobalamin (,VITAMIN B-12,) 1000 MCG/ML injection Inject 1 mL (1,000 mcg total) into the muscle every 21 ( twenty-one) days. (Patient not taking: Reported on 05/24/2020) 7 mL 1  . gabapentin (NEURONTIN) 300 MG capsule Take 4 capsules by mouth every morning, 3 capsules in the afternoon, and 4 capsules at night (Patient not taking: Reported on 05/24/2020) 990 capsule 0      Drug Regimen Review Drug regimen was reviewed and remains appropriate with no  significant issues identified   Home: Home Living Family/patient expects to be discharged to:: Private residence Living Arrangements: Spouse/significant other Available Help at Discharge: Family Type of Home: House Home Access: Stairs to enter Secretary/administrator of Steps: 2 Entrance Stairs-Rails: Right Home Layout: Two level, 1/2 bath on main level, Bed/bath upstairs Bathroom Shower/Tub: Health visitor: Standard Bathroom Accessibility: Yes Home Equipment: Environmental consultant - 2 wheels, Cane - single point, Bedside commode Additional Comments: reports that she has been sleeping on the main level due to increased difficulty with the stairs. the couch can turn into a bed    Functional History: Prior Function Level of Independence: Needs assistance Gait / Transfers Assistance Needed: walks with cane outside the home ADL's / Homemaking Assistance Needed: reports spouse (A) with stairs to go upstairs for bathing. "i do not bathe unless he is there to help me"  Comments: pt was driving and independent with self care   Functional Status:  Mobility: Bed Mobility Overal bed mobility: Needs Assistance Bed Mobility: Rolling, Sidelying to Sit Rolling: Min assist Sidelying to sit: Mod assist Supine to sit: Min assist Sit to supine: +2 for physical assistance, Max assist General bed mobility comments: Min A for assist with rolling and mod A for trunk elevation.  Transfers Overall transfer level: Needs assistance Equipment used: Rolling walker (2 wheeled) Transfers: Sit to/from Stand, [employer] Transfers Sit to Stand: Mod assist, +2 physical assistance, From elevated surface Stand pivot transfers: Min assist, +2 physical assistance General transfer comment: VC for correct hand placement and positioning in RW due to LLE weakness/hx of buckling Ambulation/Gait Ambulation/Gait assistance: Mod assist, Min assist, +2 physical assistance Gait Distance (Feet): 2 Feet Assistive device:  Rolling walker (2 wheeled) Gait Pattern/deviations: Step-to pattern, Decreased step length - right, Decreased step length - left, Decreased weight shift to left General Gait Details: Took steps from [facility] to chair using RW. Cues  for appropriate use of RW. Noted instability of L knee. Required min to mod A for steadying.  Gait velocity: Decreased   ADL: ADL Overall ADL's : Needs assistance/impaired Eating/Feeding: Modified independent, Bed level Grooming: Sitting, Minimal assistance Grooming Details (indicate cue type and reason): to reach hair Upper Body Bathing: Set up, Sitting Lower Body Bathing: Moderate assistance, Sit to/from stand Upper Body Dressing : Minimal assistance, Sitting Upper Body Dressing Details (indicate cue type and reason): A to donn/doff cervical collar Lower Body Dressing: Maximal assistance, Sit to/from stand Toilet Transfer: Moderate assistance, +2 for physical assistance, Stand-pivot, BSC, RW Toilet Transfer Details (indicate cue type and reason): ablet o take several pivotal steps to [facility] Toileting- Clothing Manipulation and Hygiene: Total assistance, Sit to/from stand Toileting - Clothing Manipulation Details (indicate cue type and reason): unable to release RW for pericare Functional mobility during ADLs: Rolling walker, Cueing for sequencing, Cueing for safety, +2 for physical assistance, Moderate assistance General ADL Comments: Pt normally bends forward tfor LB ADL; will need to use AE; unablet o complete figure four positioning   Cognition: Cognition Overall Cognitive Status: Within Functional Limits for tasks assessed Orientation Level: Oriented X4 Cognition Arousal/Alertness: Awake/alert Behavior During Therapy: WFL for tasks assessed/performed Overall Cognitive Status: Within Functional Limits for tasks assessed   Physical Exam: Blood pressure 134/73, pulse 80, temperature 98 F (36.7 C), temperature source Oral, resp. rate 18, height 5' 5.5" (1.664  m), weight 102.1 kg, SpO2 97 %. Physical Exam Neurological:     Comments: Patient is alert in no acute distress.  Oriented x3 and follows commands.     General: No acute distress Mood and affect are appropriate Heart: Regular rate and rhythm no rubs murmurs or extra sounds Lungs: Clear to auscultation, breathing unlabored, no rales or wheezes Abdomen: Positive bowel sounds, soft nontender to palpation, nondistended Extremities: No clubbing, cyanosis, or edema Skin: No evidence of breakdown, no evidence of rash Neurologic: Cranial nerves II through XII intact, motor strength is 5/5 in bilateral deltoid, bicep, tricep, grip, 4 -/5 right and 3 -/5 left hip flexor, knee extensors, ankle dorsiflexor and plantar flexor Sensory exam normal sensation to light touch and proprioception in bilateral upper and lower extremities  Musculoskeletal: Reduced active range of motion left greater than right hip    Lab Results Last 48 Hours        Results for orders placed or performed during the hospital encounter of 05/24/20 (from the past 48 hour(s))  CBC     Status: Abnormal    Collection Time: 05/26/20  9:03 PM  Result Value Ref Range    WBC 5.6 4.0 - 10.5 K/uL    RBC 3.57 (L) 3.87 - 5.11 MIL/uL    Hemoglobin 12.8 12.0 - 15.0 g/dL    HCT 16.1 36 - 46 %    MCV 107.8 (H) 80.0 - 100.0 fL    MCH 35.9 (H) 26.0 - 34.0 pg    MCHC 33.2 30.0 - 36.0 g/dL    RDW 09.6 04.5 - 40.9 %    Platelets 215 150 - 400 K/uL    nRBC 0.0 0.0 - 0.2 %      Comment: Performed at [facility], [address], [place], [place] [address]  Protime-[name]     Status: None    Collection Time: 05/26/20  9:03 PM  Result Value Ref Range    Prothrombin Time 13.2 11.4 - 15.2 seconds    [name] 1.0 0.8 - 1.2  Comment: (NOTE) [name] goal varies based on device and disease states. Performed at [facility], [address] [place], [place] [address]    Basic metabolic panel     Status: Abnormal    Collection Time:  05/26/20  9:03 PM  Result Value Ref Range    Sodium 136 135 - 145 mmol/L    Potassium 4.0 3.5 - 5.1 mmol/L    Chloride 103 98 - 111 mmol/L    CO2 25 22 - 32 mmol/L    Glucose, Bld 119 (H) 70 - 99 mg/dL      Comment: Glucose reference range applies only to samples taken after fasting for at least 8 hours.    BUN 16 8 - 23 mg/dL    Creatinine, Ser 6.040.82 0.44 - 1.00 mg/dL    Calcium 8.8 (L) 8.9 - 10.3 mg/dL    GFR calc non Af Amer >60 >60 mL/min    GFR calc Af Amer >60 >60 mL/min    Anion gap 8 5 - 15      Comment: Performed at [facility], [address] [place], [place] [address]       Imaging Results (Last 48 hours)  DG Cervical Spine 1 View   Result Date: 05/27/2020 CLINICAL DATA:  Cervical spine surgery EXAM: DG CERVICAL SPINE - 1 VIEW COMPARISON:  05/26/2020 FINDINGS: Patient is intubated. There are multiple metallic opacities that project over the neck including what are suspected to be surgical sponges anterior to the C7 level. IMPRESSION: Multiple metallic opacities that project over the neck including what are suspected to be surgical sponges anterior to the C7 level. Electronically Signed   By: Deatra RobinsonKevin  Herman M.D.   On: 05/27/2020 01:15    DG Cervical Spine 1 View   Result Date: 05/26/2020 CLINICAL DATA:  Surgery. EXAM: DG CERVICAL SPINE - 1 VIEW COMPARISON:  MRI earlier today. FINDINGS: Single lateral view of the cervical spine demonstrates surgical instrument localizing anteriorly at the level of C4-C5. IMPRESSION: Surgical instrument localizing anteriorly at the level of C4-C5. Electronically Signed   By: Narda RutherfordMelanie  Sanford M.D.   On: 05/26/2020 23:06    MR CERVICAL SPINE WO CONTRAST   Addendum Date: 05/26/2020   ADDENDUM REPORT: 05/26/2020 19:25 ADDENDUM: These results were called by telephone at the time of interpretation on 05/26/2020 at 7:24 pm to provider Rathore , who verbally acknowledged these results. Electronically Signed   By: Marlan Palauharles  Clark M.D.   On: 05/26/2020  19:25    Result Date: 05/26/2020 CLINICAL DATA:  Cervical radiculopathy no red flags EXAM: MRI CERVICAL SPINE WITHOUT CONTRAST TECHNIQUE: Multiplanar, multisequence MR imaging of the cervical spine was performed. No intravenous contrast was administered. COMPARISON:  CT cervical spine 12/04/2018 FINDINGS: Alignment: Mild anterolisthesis C4-5.  2 mm anterolisthesis C7-T1. Vertebrae: Negative for fracture or mass. Cord: Cord compression at C7-T1 with cord hyperintensity which is ill-defined. This may be acute. Remainder of the cord signal is normal. Posterior Fossa, vertebral arteries, paraspinal tissues: Negative Disc levels: C2-3: Negative C3-4: Bilateral facet degeneration and mild disc degeneration. Mild foraminal stenosis bilaterally due to spurring. C4-5: Moderate facet degeneration bilaterally. Disc degeneration and spurring. Moderate foraminal encroachment bilaterally due to spurring. C5-6: Bilateral facet degeneration. Disc degeneration and diffuse uncinate spurring. Severe right foraminal encroachment and moderate to severe left foraminal encroachment due to spurring. Mild spinal stenosis. C6-7: Disc degeneration with diffuse uncinate spurring. Bilateral facet hypertrophy. Mild spinal stenosis and moderate foraminal stenosis bilaterally. C7-T1: Large extruded disc  fragment to the left of midline. There is cord compression and cord hyperintensity. There is bilateral facet hypertrophy and mild foraminal stenosis bilaterally. IMPRESSION: Multilevel degenerative change. Disc and facet degeneration and spurring causing foraminal encroachment at multiple levels as above. Large extruded disc fragment to the left of midline at C7-T1. There is cord compression and ill-defined cord hyperintensity which may be acute. Electronically Signed: By: Marlan Palau M.D. On: 05/26/2020 19:15    MR LUMBAR SPINE WO CONTRAST   Result Date: 05/26/2020 CLINICAL DATA:  Low back pain.  Progressive neurologic deficit. EXAM: MRI  LUMBAR SPINE WITHOUT CONTRAST TECHNIQUE: Multiplanar, multisequence MR imaging of the lumbar spine was performed. No intravenous contrast was administered. COMPARISON:  None. FINDINGS: Segmentation:  Normal Alignment:  Mild anterolisthesis L3-4 L4-5 Vertebrae:  Normal bone marrow.  Negative for fracture or mass. Conus medullaris and cauda equina: Conus extends to the L2 level. Conus and cauda equina appear normal. Paraspinal and other soft tissues: Negative for paraspinous mass or adenopathy. No soft tissue edema or fluid collection. Diffuse muscular atrophy. Disc levels: L1-2: Broad-based central disc protrusion and mild facet degeneration. Negative for stenosis L2-3: Mild disc bulging and moderate facet hypertrophy bilaterally. Mild subarticular stenosis bilaterally L3-4: Disc degeneration with diffuse bulging of the disc. Moderate to advanced facet hypertrophy bilaterally. Mild spinal stenosis and mild subarticular stenosis bilaterally. L4-5: Small right foraminal disc protrusion displacing the right L4 nerve root. Severe facet degeneration. Moderate spinal stenosis and moderate subarticular stenosis bilaterally. L5-S1: Small central disc protrusion and mild facet degeneration. Negative for spinal or foraminal stenosis. IMPRESSION: Multilevel degenerative change throughout the lumbar spine. Negative for lumbar fracture Mild spinal stenosis L3-4 with mild subarticular stenosis bilaterally. Moderate spinal stenosis at L4-5 with moderate subarticular stenosis bilaterally. Electronically Signed   By: Marlan Palau M.D.   On: 05/26/2020 18:58             Medical Problem List and Plan: 1.  Left leg weakness secondary to large herniated disc at C7-T1/cord compression.  Status post anterior cervical discectomy with decompression 05/27/2020.  Cervical collar as directed             -patient may not shower             -ELOS/Goals: 7 to 10 days, supervision to min assist goals 2.   Antithrombotics: -DVT/anticoagulation: SCDs.  Check vascular study             -antiplatelet therapy: N/A 3. Pain Management: Neurontin 1200 mg nightly and 1500 mg daily, Flexeril 10 mg 3 times daily as needed, oxycodone and tramadol as needed 4. Mood: Xanax as needed             -antipsychotic agents: N/A 5. Neuropsych: This patient is capable of making decisions on her own behalf. 6. Skin/Wound Care: Routine skin checks 7. Fluids/Electrolytes/Nutrition: Routine in and outs with follow-up chemistries 8.  Hypertension.  Lotensin 20 mg daily, HCTZ 25 mg daily, Coreg 6.25 mg twice daily.  Monitor with increased mobility 9.  Hypothyroidism.  Synthroid 10.  Hyperlipidemia.  Crestor 11.  Macrocytosis without anemia.  Follow-up CBC 12.  History of alcohol use.  Provide counseling 13.  Constipation.  Laxative assistance    Charlton Amor, PA-C 05/28/2020        "I have personally performed a face to face diagnostic evaluation of this patient.  Additionally, I have reviewed and concur with the physician assistant's documentation above." Erick Colace M.D. [facility] FAAPM&R (Neuromuscular  Med) Diplomate Am Board of Electrodiagnostic Med Fellow Am Board of Interventional Pain

## 2020-05-29 NOTE — Progress Notes (Signed)
Report received about 1250 from [place], [place]. Patient is A&O x4. Surgical incision to neck with surgery at C7-T1, redness under right breast, psoriasis to arms and legs, heart healthy diet, continent of B&B, 1 assist with walker. Patient arrived to unit just after 1500 via wheelchair by staff. Patient alert and able to transfer with 1 assist to wheelchair then to bathroom with writers assistance. Patient voided X1. Patient had neck brace on when up OOB. Patient lungs are CTA, active bowel sounds in all 4 quadrants, psoriasis noted to bilateral arms and legs, patient noted to have what appears to be a blackhead to the middle of her back, bilateral feet are cool, dry and flakey with pedal pulses felt. Patient stated her feet always look that way. Patient is very excited to be here and ready for therapy.

## 2020-05-29 NOTE — Discharge Summary (Signed)
Physician Discharge Summary  [name] ZOX:[id number] DOB: Feb 14, 1946 DOA: 05/24/2020  PCP: [name] MaskerAbonza, [name], PA-C  Admit date: 05/24/2020 Discharge date: 05/29/2020  Admitted From: Home Disposition:  CIR  Recommendations for Outpatient Follow-up:  1. Follow up with PCP in 1-2 weeks when discharged 2. Follow up with Neurosurgery as scheduled  Discharge Condition:Improved CODE STATUS:Full Diet recommendation: Heart healthy   Brief/Interim Summary: 74 year old Caucasian female, retired Engineer, civil (consulting)nurse, with past medical history significant for hypertension, hyperlipidemia, hypothyroidism, chronic diastolic CHF, venostasis, alcohol use disorder and reported GI bleed for which EGD was said to have revealedbleeding ulcer that was"clipped ".Patient is worried if the clip is MRI compatible. Patient's history is quite suggestible. Patient presented with left lower extremity pain that sounded close to radiculopathy. Patient also reported what appeared to be radiculopathic pain from the neck area, radiating to the left upper extremity. MRI of the brain is nonrevealing. We will have a low threshold to proceed with several: Lumbosacral MRI if no contraindications. Input from physical therapy and Occupational Therapy is appreciated. CIR has been recommended.  Discharge Diagnoses:  Principal Problem:   Left leg weakness Active Problems:   Hypertension   Hypothyroidism   HLD (hyperlipidemia)   Alcohol use   Pressure injury of skin   Radiculopathy   HNP (herniated nucleus pulposus) with myelopathy, cervical  Left lower extremity weakness/numbness/neck pain radiating to the left upper extremity secondary to cord compression from C7-T1 disc fragment: -Head CT negative for acute stroke. -MRI brain is nonrevealing. -Pt continued to complain of L sided weakness and neck pain, prompting cervical and lumbar MRI. Findings reviewed and concerning for acute cord compression from C7-T1 disc  fracgment -Pt now s/p emergent cervical discectomy/decompression. Appreciate assistance by Neurosurgery -Pt reports feeling stronger after decompression surgery. Therapy recommends CIR and pt agrees. Plan admit to CIR today  Alcohol use disorder: -Patient reports drinking a bottle of wine daily. Currently not displaying any signs of withdrawal but is at high risk for withdrawal during this hospitalization. -Was continued with CIWA protocol; Ativan as needed. No withdrawals noted this visit. Thiamine and folate.  Hypertension: -controlled at this time, stable  Macrocytosis without anemia: Hemoglobin 13.3, MCV 109. Likely related to chronic alcohol abuse. -B12 is 1312. Folate is 22.6.  Hyperlipidemia -Continue Crestor as pt tolerates  Hypothyroidism -Continue Synthroid  GERD -Continue PPI  Chronic diastolic CHF: -Presently compensated.  Discharge Instructions   Allergies as of 05/29/2020      Reactions   Cocoa Anaphylaxis   Red Dye Hives   Rash and Nausea diarrhea   Lyrica [pregabalin] Other (See Comments)   Abnormal bleeding   Other Other (See Comments)   Berries : Rash   Sulfa Antibiotics Rash      Medication List    STOP taking these medications   HYDROcodone-acetaminophen 10-325 MG tablet Commonly known as: NORCO     TAKE these medications   acetaminophen 500 MG tablet Commonly known as: TYLENOL Take 500 mg by mouth every 6 (six) hours as needed for moderate pain. Take 1 to 2 tablets daily   benazepril-hydrochlorthiazide 20-25 MG tablet Commonly known as: LOTENSIN HCT TAKE 1 TABLET BY MOUTH DAILY.   carvedilol 6.25 MG tablet Commonly known as: COREG TAKE 1 TABLET BY MOUTH 2 TIMES DAILY WITH A MEAL. What changed: See the new instructions.   cyclobenzaprine 10 MG tablet Commonly known as: FLEXERIL TAKE 1 TABLET BY MOUTH 3 TIMES DAILY AS NEEDED FOR MUSCLE SPASMS. What changed: See the new instructions.  diphenhydrAMINE 25 MG  tablet Commonly known as: BENADRYL Take 25 mg by mouth at bedtime as needed for sleep.   DSS 100 MG Caps Take 100 mg by mouth 2 (two) times daily.   ferrous sulfate 325 (65 FE) MG tablet Take 325 mg by mouth daily with breakfast.   furosemide 40 MG tablet Commonly known as: LASIX Take 1 tablet (40 mg total) by mouth daily. What changed:   how much to take  when to take this  additional instructions   gabapentin 300 MG capsule Commonly known as: NEURONTIN Take 300 mg by mouth See admin instructions. Take 5 capsules in the morning and 4 capsules at bedtime.   gabapentin 300 MG capsule Commonly known as: NEURONTIN Take 4 capsules by mouth every morning, 3 capsules in the afternoon, and 4 capsules at night   levothyroxine 125 MCG tablet Commonly known as: SYNTHROID Only take 1 tab Tues and Fridays What changed:   how much to take  how to take this  when to take this  additional instructions   levothyroxine 150 MCG tablet Commonly known as: SYNTHROID everyday except Tues and Friday- take those days What changed:   how much to take  how to take this  when to take this  additional instructions   Melatonin 5 MG Caps Take 10 mg by mouth at bedtime.   meloxicam 15 MG tablet Commonly known as: MOBIC TAKE 1 TABLET BY MOUTH DAILY.   Oxycodone HCl 10 MG Tabs Take 1 tablet (10 mg total) by mouth every 3 (three) hours as needed for severe pain ((score 7 to 10)).   oxyCODONE 5 MG immediate release tablet Commonly known as: Oxy IR/ROXICODONE Take 1 tablet (5 mg total) by mouth every 3 (three) hours as needed for moderate pain ((score 4 to 6)).   pantoprazole 40 MG tablet Commonly known as: PROTONIX Take 1 tablet (40 mg total) by mouth daily.   rosuvastatin 5 MG tablet Commonly known as: Crestor Take 1 tablet (5 mg total) by mouth daily.   traMADol 50 MG tablet Commonly known as: ULTRAM TAKE 1 TABLET BY MOUTH 2 TIMES DAILY. MAXIMUM 6 TABLETS PER DAY  AS DIRECTED BY PRESCRIBER What changed:   how much to take  how to take this  when to take this  reasons to take this  additional instructions   VITAMIN B 12 PO Take 2,500 mg by mouth once a week. What changed: Another medication with the same name was removed. Continue taking this medication, and follow the directions you see here.   Vitamin D3 125 MCG (5000 UT) Tabs Take 15,000 Units by mouth See admin instructions. Takes 39767 units 5 days a week and 20000 units on 2 days.   zolpidem 5 MG tablet Commonly known as: AMBIEN Take 1 tablet (5 mg total) by mouth at bedtime as needed for sleep.       Follow-up Information    Tressie Stalker, MD. Schedule an appointment as soon as possible for a visit in 2 week(s).   Specialty: Neurosurgery Contact information: [address] [place] [address] [phone]        [name] Masker, PA-C. Schedule an appointment as soon as possible for a visit in 2 week(s).   Specialty: Physician Assistant Contact information: [address] [place] [address] [phone]        Marykay Lex, MD .   Specialty: Cardiology Contact information: [address] [place]  [place] [address] [phone]              Allergies  Allergen Reactions  . Cocoa Anaphylaxis  . Red Dye Hives    Rash and Nausea diarrhea  . Lyrica [Pregabalin] Other (See Comments)    Abnormal bleeding  . Other Other (See Comments)    Berries : Rash  . Sulfa Antibiotics Rash    Consultations:  CIR  Neurosurgery  Procedures/Studies: DG Cervical Spine 1 View  Result Date: 05/27/2020 CLINICAL DATA:  Cervical spine surgery EXAM: DG CERVICAL SPINE - 1 VIEW COMPARISON:  05/26/2020 FINDINGS: Patient is intubated. There are multiple metallic opacities that project over the neck including what are suspected to be surgical sponges anterior to the C7 level. IMPRESSION: Multiple metallic opacities that project  over the neck including what are suspected to be surgical sponges anterior to the C7 level. Electronically Signed   By: Deatra Robinson M.D.   On: 05/27/2020 01:15   DG Cervical Spine 1 View  Result Date: 05/26/2020 CLINICAL DATA:  Surgery. EXAM: DG CERVICAL SPINE - 1 VIEW COMPARISON:  MRI earlier today. FINDINGS: Single lateral view of the cervical spine demonstrates surgical instrument localizing anteriorly at the level of C4-C5. IMPRESSION: Surgical instrument localizing anteriorly at the level of C4-C5. Electronically Signed   By: Narda Rutherford M.D.   On: 05/26/2020 23:06   CT HEAD WO CONTRAST  Result Date: 05/24/2020 CLINICAL DATA:  LEFT leg weakness since Friday EXAM: CT HEAD WITHOUT CONTRAST TECHNIQUE: Contiguous axial images were obtained from the base of the skull through the vertex without intravenous contrast. COMPARISON:  December 04, 2018 FINDINGS: Brain: No evidence of acute infarction, hemorrhage, hydrocephalus, extra-axial collection or mass lesion/mass effect. Vascular: No hyperdense vessel or unexpected calcification. Skull: Normal. Negative for fracture or focal lesion. Sinuses/Orbits: Chronic opacification of the RIGHT maxillary sinus with similar appearance to the previous imaging study. Orbits are unremarkable. Other: None. IMPRESSION: 1. No acute intracranial abnormality. 2. Chronic RIGHT maxillary sinus disease. 3. Signs of trauma to the RIGHT nasal bone with similar appearance in terms of deformity. Electronically Signed   By: Donzetta Kohut M.D.   On: 05/24/2020 17:45   MR BRAIN WO CONTRAST  Result Date: 05/25/2020 CLINICAL DATA:  Initial evaluation for acute left lower extremity weakness. EXAM: MRI HEAD WITHOUT CONTRAST TECHNIQUE: Multiplanar, multiecho pulse sequences of the brain and surrounding structures were obtained without intravenous contrast. COMPARISON:  Prior head CT from 05/24/2020. FINDINGS: Brain: Generalized age-related cerebral atrophy. Minimal chronic  microvascular ischemic changes for age. No abnormal foci of restricted diffusion to suggest acute or subacute ischemia. Gray-white matter differentiation maintained. No encephalomalacia to suggest chronic cortical infarction. No evidence for acute intracranial hemorrhage. Single punctate focus of susceptibility artifact noted at the left parietal region, consistent with a small chronic microhemorrhage, of doubtful significance in isolation. No mass lesion, midline shift or mass effect. No hydrocephalus or extra-axial fluid collection. Pituitary gland suprasellar region normal. Midline structures intact and normal. Vascular: Major intracranial vascular flow voids are maintained. Skull and upper cervical spine: Craniocervical junction within normal limits. Bone marrow signal intensity normal. Well-circumscribed subcentimeter T1 hyperintense lesion noted at the right parietal calvarium, nonspecific, but most likely benign and of doubtful significance. No scalp soft tissue abnormality. Sinuses/Orbits: Globes and orbital soft tissues within normal limits. Chronic right maxillary sinusitis noted. Paranasal sinuses are otherwise largely clear. No mastoid effusion. Inner ear structures grossly normal. Other: None. IMPRESSION: 1. Negative brain MRI for age, with no acute intracranial  abnormality identified. 2. Chronic right maxillary sinusitis. Electronically Signed   By: Rise Mu M.D.   On: 05/25/2020 03:29   MR CERVICAL SPINE WO CONTRAST  Addendum Date: 05/26/2020   ADDENDUM REPORT: 05/26/2020 19:25 ADDENDUM: These results were called by telephone at the time of interpretation on 05/26/2020 at 7:24 pm to provider R[place] , who verbally acknowledged these results. Electronically Signed   By: Marlan Palau M.D.   On: 05/26/2020 19:25   Result Date: 05/26/2020 CLINICAL DATA:  Cervical radiculopathy no red flags EXAM: MRI CERVICAL SPINE WITHOUT CONTRAST TECHNIQUE: Multiplanar, multisequence MR imaging of the  cervical spine was performed. No intravenous contrast was administered. COMPARISON:  CT cervical spine 12/04/2018 FINDINGS: Alignment: Mild anterolisthesis C4-5.  2 mm anterolisthesis C7-T1. Vertebrae: Negative for fracture or mass. Cord: Cord compression at C7-T1 with cord hyperintensity which is ill-defined. This may be acute. Remainder of the cord signal is normal. Posterior Fossa, vertebral arteries, paraspinal tissues: Negative Disc levels: C2-3: Negative C3-4: Bilateral facet degeneration and mild disc degeneration. Mild foraminal stenosis bilaterally due to spurring. C4-5: Moderate facet degeneration bilaterally. Disc degeneration and spurring. Moderate foraminal encroachment bilaterally due to spurring. C5-6: Bilateral facet degeneration. Disc degeneration and diffuse uncinate spurring. Severe right foraminal encroachment and moderate to severe left foraminal encroachment due to spurring. Mild spinal stenosis. C6-7: Disc degeneration with diffuse uncinate spurring. Bilateral facet hypertrophy. Mild spinal stenosis and moderate foraminal stenosis bilaterally. C7-T1: Large extruded disc fragment to the left of midline. There is cord compression and cord hyperintensity. There is bilateral facet hypertrophy and mild foraminal stenosis bilaterally. IMPRESSION: Multilevel degenerative change. Disc and facet degeneration and spurring causing foraminal encroachment at multiple levels as above. Large extruded disc fragment to the left of midline at C7-T1. There is cord compression and ill-defined cord hyperintensity which may be acute. Electronically Signed: By: Marlan Palau M.D. On: 05/26/2020 19:15   MR LUMBAR SPINE WO CONTRAST  Result Date: 05/26/2020 CLINICAL DATA:  Low back pain.  Progressive neurologic deficit. EXAM: MRI LUMBAR SPINE WITHOUT CONTRAST TECHNIQUE: Multiplanar, multisequence MR imaging of the lumbar spine was performed. No intravenous contrast was administered. COMPARISON:  None. FINDINGS:  Segmentation:  Normal Alignment:  Mild anterolisthesis L3-4 L4-5 Vertebrae:  Normal bone marrow.  Negative for fracture or mass. Conus medullaris and cauda equina: Conus extends to the L2 level. Conus and cauda equina appear normal. Paraspinal and other soft tissues: Negative for paraspinous mass or adenopathy. No soft tissue edema or fluid collection. Diffuse muscular atrophy. Disc levels: L1-2: Broad-based central disc protrusion and mild facet degeneration. Negative for stenosis L2-3: Mild disc bulging and moderate facet hypertrophy bilaterally. Mild subarticular stenosis bilaterally L3-4: Disc degeneration with diffuse bulging of the disc. Moderate to advanced facet hypertrophy bilaterally. Mild spinal stenosis and mild subarticular stenosis bilaterally. L4-5: Small right foraminal disc protrusion displacing the right L4 nerve root. Severe facet degeneration. Moderate spinal stenosis and moderate subarticular stenosis bilaterally. L5-S1: Small central disc protrusion and mild facet degeneration. Negative for spinal or foraminal stenosis. IMPRESSION: Multilevel degenerative change throughout the lumbar spine. Negative for lumbar fracture Mild spinal stenosis L3-4 with mild subarticular stenosis bilaterally. Moderate spinal stenosis at L4-5 with moderate subarticular stenosis bilaterally. Electronically Signed   By: Marlan Palau M.D.   On: 05/26/2020 18:58     Subjective: Eager to start therapy  Discharge Exam: Vitals:   05/29/20 0814 05/29/20 1204  BP: 125/61 135/77  Pulse: 79 84  Resp: 16 18  Temp: 98.3 F (36.8 C) 98.2  F (36.8 C)  SpO2: 96% 95%   Vitals:   05/28/20 2319 05/29/20 0416 05/29/20 0814 05/29/20 1204  BP: (!) 99/48 107/64 125/61 135/77  Pulse: 76 76 79 84  Resp: Temp: (!) 97.5 F (36.4 C) 98.1 F (36.7 C) 98.3 F (36.8 C) 98.2 F (36.8 C)  TempSrc: Oral Oral Oral Oral  SpO2: 95% 94% 96% 95%  Weight:      Height:        General: Pt is alert, awake, not  in acute distress Cardiovascular: RRR, S1/S2 +, no rubs, no gallops Respiratory: CTA bilaterally, no wheezing, no rhonchi Abdominal: Soft, NT, ND, bowel sounds + Extremities: no edema, no cyanosis   The results of significant diagnostics from this hospitalization (including imaging, microbiology, ancillary and laboratory) are listed below for reference.     Microbiology: Recent Results (from the past 240 hour(s))  SARS Coronavirus 2 by RT PCR (hospital order, performed in [facility] hospital lab) Nasopharyngeal     Status: None   Collection Time: 05/25/20  1:00 AM   Specimen: Nasopharyngeal  Result Value Ref Range Status   SARS Coronavirus 2 NEGATIVE NEGATIVE Final    Comment: (NOTE) SARS-CoV-2 target nucleic acids are NOT DETECTED.  The SARS-CoV-2 RNA is generally detectable in upper and lower respiratory specimens during the acute phase of infection. The lowest concentration of SARS-CoV-2 viral copies this assay can detect is 250 copies / mL. A negative result does not preclude SARS-CoV-2 infection and should not be used as the sole basis for treatment or other patient management decisions.  A negative result may occur with improper specimen collection / handling, submission of specimen other than nasopharyngeal swab, presence of viral mutation(s) within the areas targeted by this assay, and inadequate number of viral copies (<250 copies / mL). A negative result must be combined with clinical observations, patient history, and epidemiological information.  Fact Sheet for Patients:   BoilerBrush.com.cy  Fact Sheet for Healthcare Providers: https://pope.com/  This test is not yet approved or  cleared by the [place] FDA and has been authorized for detection and/or diagnosis of SARS-CoV-2 by FDA under an Emergency Use Authorization (EUA).  This EUA will remain in effect (meaning this test can be used) for the duration of  the COVID-19 declaration under Section 564(b)(1) of the Act, 21 U.S.C. section 360bbb-3(b)(1), unless the authorization is terminated or revoked sooner.  Performed at [facility], [address] [place], [place] [address]      Labs: BNP (last 3 results) Recent Labs    01/13/20 1517  BNP 91.1   Basic Metabolic Panel: Recent Labs  Lab 05/24/20 1608 05/25/20 0500 05/26/20 2103  NA 133*  --  136  K 4.2  --  4.0  CL 98  --  103  CO2 26  --  25  GLUCOSE 103*  --  119*  BUN 14  --  16  CREATININE 0.94  --  0.82  CALCIUM 9.0  --  8.8*  MG  --  2.4  --   PHOS  --  3.8  --    Liver Function Tests: Recent Labs  Lab 05/24/20 1608  AST 26  ALT 19  ALKPHOS 80  BILITOT 0.7  PROT 6.3*  ALBUMIN 3.4*   No results for input(s): LIPASE, AMYLASE in the last 168 hours. No results for input(s): AMMONIA in the last 168 hours. CBC: Recent Labs  Lab 05/24/20 1608 05/26/20 2103  WBC 5.2 5.6  NEUTROABS 2.5  --   HGB 13.3 12.8  HCT 40.9 38.5  MCV 109.1* 107.8*  PLT 226 215   Cardiac Enzymes: No results for input(s): [name], [name], CKMBINDEX, TROPONINI in the last 168 hours. BNP: Invalid input(s): POCBNP CBG: No results for input(s): GLUCAP in the last 168 hours. D-Dimer No results for input(s): DDIMER in the last 72 hours. Hgb A1c No results for input(s): HGBA1C in the last 72 hours. Lipid Profile No results for input(s): CHOL, HDL, LDLCALC, TRIG, CHOLHDL, LDLDIRECT in the last 72 hours. Thyroid function studies No results for input(s): TSH, T4TOTAL, T3FREE, THYROIDAB in the last 72 hours.  Invalid input(s): FREET3 Anemia work up No results for input(s): VITAMINB12, FOLATE, FERRITIN, TIBC, IRON, RETICCTPCT in the last 72 hours. Urinalysis    Component Value Date/Time   COLORURINE YELLOW 05/25/2020 0106   APPEARANCEUR CLEAR 05/25/2020 0106   LABSPEC 1.005 05/25/2020 0106   PHURINE 6.0 05/25/2020 0106   GLUCOSEU NEGATIVE 05/25/2020 0106   HGBUR SMALL (A)  05/25/2020 0106   BILIRUBINUR NEGATIVE 05/25/2020 0106   BILIRUBINUR negative 10/04/2015 1237   BILIRUBINUR small 04/25/2015 0937   KETONESUR NEGATIVE 05/25/2020 0106   PROTEINUR NEGATIVE 05/25/2020 0106   UROBILINOGEN 0.2 10/04/2015 1237   UROBILINOGEN 0.2 01/15/2013 1512   NITRITE NEGATIVE 05/25/2020 0106   LEUKOCYTESUR NEGATIVE 05/25/2020 0106   Sepsis Labs Invalid input(s): PROCALCITONIN,  WBC,  LACTICIDVEN Microbiology Recent Results (from the past 240 hour(s))  SARS Coronavirus 2 by RT PCR (hospital order, performed in [facility] hospital lab) Nasopharyngeal     Status: None   Collection Time: 05/25/20  1:00 AM   Specimen: Nasopharyngeal  Result Value Ref Range Status   SARS Coronavirus 2 NEGATIVE NEGATIVE Final    Comment: (NOTE) SARS-CoV-2 target nucleic acids are NOT DETECTED.  The SARS-CoV-2 RNA is generally detectable in upper and lower respiratory specimens during the acute phase of infection. The lowest concentration of SARS-CoV-2 viral copies this assay can detect is 250 copies / mL. A negative result does not preclude SARS-CoV-2 infection and should not be used as the sole basis for treatment or other patient management decisions.  A negative result may occur with improper specimen collection / handling, submission of specimen other than nasopharyngeal swab, presence of viral mutation(s) within the areas targeted by this assay, and inadequate number of viral copies (<250 copies / mL). A negative result must be combined with clinical observations, patient history, and epidemiological information.  Fact Sheet for Patients:   BoilerBrush.com.cy  Fact Sheet for Healthcare Providers: https://pope.com/  This test is not yet approved or  cleared by the [place] FDA and has been authorized for detection and/or diagnosis of SARS-CoV-2 by FDA under an Emergency Use Authorization (EUA).  This EUA will remain in  effect (meaning this test can be used) for the duration of the COVID-19 declaration under Section 564(b)(1) of the Act, 21 U.S.C. section 360bbb-3(b)(1), unless the authorization is terminated or revoked sooner.  Performed at [facility], [address] [place], [place] [address]    Time spent: 30 min  SIGNED:   Rickey Barbara, MD  [facility] 05/29/2020, 2:33 PM  If 7PM-7AM, please contact night-coverage

## 2020-05-30 ENCOUNTER — Inpatient Hospital Stay (HOSPITAL_COMMUNITY): Payer: Medicare PPO

## 2020-05-30 ENCOUNTER — Inpatient Hospital Stay (HOSPITAL_COMMUNITY): Payer: Medicare PPO | Admitting: Occupational Therapy

## 2020-05-30 DIAGNOSIS — M7989 Other specified soft tissue disorders: Secondary | ICD-10-CM

## 2020-05-30 MED ORDER — CARVEDILOL 3.125 MG PO TABS
3.1250 mg | ORAL_TABLET | Freq: Two times a day (BID) | ORAL | Status: DC
  Administered 2020-05-30 – 2020-06-05 (×13): 3.125 mg via ORAL
  Filled 2020-05-30 (×14): qty 1

## 2020-05-30 NOTE — Plan of Care (Signed)
LTGs established   Problem: RH Balance Goal: LTG: Patient will maintain dynamic sitting balance (OT) Description: LTG:  Patient will maintain dynamic sitting balance with assistance during activities of daily living (OT) Flowsheets (Taken 05/30/2020 1548) LTG: Pt will maintain dynamic sitting balance during ADLs with: Independent Goal: LTG Patient will maintain dynamic standing with ADLs (OT) Description: LTG:  Patient will maintain dynamic standing balance with assist during activities of daily living (OT)  Flowsheets (Taken 05/30/2020 1548) LTG: Pt will maintain dynamic standing balance during ADLs with: Supervision/Verbal cueing   Problem: Sit to Stand Goal: LTG:  Patient will perform sit to stand in prep for activites of daily living with assistance level (OT) Description: LTG:  Patient will perform sit to stand in prep for activites of daily living with assistance level (OT) Flowsheets (Taken 05/30/2020 1548) LTG: PT will perform sit to stand in prep for activites of daily living with assistance level: Independent with assistive device   Problem: RH Grooming Goal: LTG Patient will perform grooming w/assist,cues/equip (OT) Description: LTG: Patient will perform grooming with assist, with/without cues using equipment (OT) Flowsheets (Taken 05/30/2020 1548) LTG: Pt will perform grooming with assistance level of: Independent with assistive device    Problem: RH Bathing Goal: LTG Patient will bathe all body parts with assist levels (OT) Description: LTG: Patient will bathe all body parts with assist levels (OT) Flowsheets (Taken 05/30/2020 1548) LTG: Pt will perform bathing with assistance level/cueing: Supervision/Verbal cueing   Problem: RH Dressing Goal: LTG Patient will perform upper body dressing (OT) Description: LTG Patient will perform upper body dressing with assist, with/without cues (OT). Flowsheets (Taken 05/30/2020 1548) LTG: Pt will perform upper body dressing with assistance  level of: Set up assist Goal: LTG Patient will perform lower body dressing w/assist (OT) Description: LTG: Patient will perform lower body dressing with assist, with/without cues in positioning using equipment (OT) Flowsheets (Taken 05/30/2020 1548) LTG: Pt will perform lower body dressing with assistance level of: Independent with assistive device   Problem: RH Toileting Goal: LTG Patient will perform toileting task (3/3 steps) with assistance level (OT) Description: LTG: Patient will perform toileting task (3/3 steps) with assistance level (OT)  Flowsheets (Taken 05/30/2020 1548) LTG: Pt will perform toileting task (3/3 steps) with assistance level: Independent with assistive device   Problem: RH Toilet Transfers Goal: LTG Patient will perform toilet transfers w/assist (OT) Description: LTG: Patient will perform toilet transfers with assist, with/without cues using equipment (OT) Flowsheets (Taken 05/30/2020 1548) LTG: Pt will perform toilet transfers with assistance level of: Supervision/Verbal cueing   Problem: RH Tub/Shower Transfers Goal: LTG Patient will perform tub/shower transfers w/assist (OT) Description: LTG: Patient will perform tub/shower transfers with assist, with/without cues using equipment (OT) Flowsheets (Taken 05/30/2020 1548) LTG: Pt will perform tub/shower stall transfers with assistance level of: Supervision/Verbal cueing

## 2020-05-30 NOTE — Progress Notes (Signed)
[facility] PHYSICAL MEDICINE & REHABILITATION PROGRESS NOTE   Subjective/Complaints: Some tingling pain going down back of arm  Discussed BP, no dizziness when it was down to 90 systolic, was sitting at that time   ROS- neg CP , SOB, N/V/D   Objective:   No results found. No results for input(s): WBC, HGB, HCT, PLT in the last 72 hours. No results for input(s): NA, K, CL, CO2, GLUCOSE, BUN, CREATININE, CALCIUM in the last 72 hours. No intake or output data in the 24 hours ending 05/30/20 0730   Physical Exam: Vital Signs Blood pressure (!) 116/54, pulse 77, temperature 98.4 F (36.9 C), temperature source Oral, resp. rate 20, height 5\' 5"  (1.651 m), weight 105.4 kg, SpO2 94 %.  General: No acute distress Mood and affect are appropriate Heart: Regular rate and rhythm no rubs murmurs or extra sounds Lungs: Clear to auscultation, breathing unlabored, no rales or wheezes Abdomen: Positive bowel sounds, soft nontender to palpation, nondistended Extremities: No clubbing, cyanosis, or edema Skin: No evidence of breakdown, no evidence of rash Neurologic: Cranial nerves II through XII intact, motor strength is 4/5 in bilateral deltoid, bicep, tricep, grip, HI hip flexor, knee extensors, ankle dorsiflexor and plantar flexor  Musculoskeletal: Full range of motion in all 4 extremities. No joint swelling     Assessment/Plan: 1. Functional deficits secondary to C7-T1 cord compression with myelopathy , s/p decompression  which require 3+ hours per day of interdisciplinary therapy in a comprehensive inpatient rehab setting.  Physiatrist is providing close team supervision and 24 hour management of active medical problems listed below.  Physiatrist and rehab team continue to assess barriers to discharge/monitor patient progress toward functional and medical goals  Care Tool:  Bathing    Body parts bathed by patient: Right arm, Left arm, Chest, Abdomen, Front perineal area, Face   Body  parts bathed by helper: Buttocks, Right upper leg, Left upper leg, Right lower leg, Left lower leg     Bathing assist Assist Level: Moderate Assistance - Patient 50 - 74%     Upper Body Dressing/Undressing Upper body dressing   What is the patient wearing?: Pull over shirt    Upper body assist Assist Level: Minimal Assistance - Patient > 75%    Lower Body Dressing/Undressing Lower body dressing      What is the patient wearing?: Incontinence brief     Lower body assist Assist for lower body dressing: Moderate Assistance - Patient 50 - 74%     Toileting Toileting    Toileting assist Assist for toileting: Moderate Assistance - Patient 50 - 74%     Transfers Chair/bed transfer  Transfers assist  Chair/bed transfer activity did not occur: N/A        Locomotion Ambulation   Ambulation assist   Ambulation activity did not occur: N/A    Assistive device: Walker-rolling     Walk 10 feet activity   Assist  Walk 10 feet activity did not occur: N/A        Walk 50 feet activity   Assist Walk 50 feet with 2 turns activity did not occur: N/A         Walk 150 feet activity   Assist Walk 150 feet activity did not occur: N/A         Walk 10 feet on uneven surface  activity   Assist Walk 10 feet on uneven surfaces activity did not occur: N/A         Wheelchair  Assist Will patient use wheelchair at discharge?: Yes Type of Wheelchair: Manual Wheelchair activity did not occur: Safety/medical concerns  Wheelchair assist level: Moderate Assistance - Patient 50 - 74%      Wheelchair 50 feet with 2 turns activity    Assist            Wheelchair 150 feet activity     Assist          Blood pressure (!) 116/54, pulse 77, temperature 98.4 F (36.9 C), temperature source Oral, resp. rate 20, height 5\' 5"  (1.651 m), weight 105.4 kg, SpO2 94 %.  Medical Problem List and Plan: 1.Left leg weaknesssecondary to large  herniated disc at C7-T1/cord compression. Status post anterior cervical discectomy with decompression 05/27/2020. Cervical collar as directed -patient may not shower -ELOS/Goals: 7 to 10 days, supervision to min assist goals 2. Antithrombotics: -DVT/anticoagulation:SCDs. Check vascular study -antiplatelet therapy: N/A 3. Pain Management:Neurontin 1200 mg nightly and 1500 mg daily, Flexeril 10 mg 3 times daily as needed, oxycodone and tramadol as needed Some C8 radicular pain as expected  4. Mood:Xanax as needed -antipsychotic agents: N/A 5. Neuropsych: This patientiscapable of making decisions on herown behalf. 6. Skin/Wound Care:Routine skin checks 7. Fluids/Electrolytes/Nutrition:Routine in and outs with follow-up chemistries 8. Hypertension. Lotensin 20 mg daily, HCTZ 25 mg daily, Coreg 6.25 mg twice daily. Monitor with increased mobility Vitals:   05/29/20 1958 05/30/20 0500  BP: (!) 90/53 (!) 116/54  Pulse: 87 77  Resp: 18 20  Temp: 98.2 F (36.8 C) 98.4 F (36.9 C)  SpO2: 94% 94%  Currently just on Coreg with soft BP will reduce to 3.125mg  BID 9. Hypothyroidism. Synthroid 10. Hyperlipidemia. Crestor 11. Macrocytosis without anemia. Follow-up CBC 12. History of alcohol use. Provide counseling 13. Constipation. Laxative assistance    LOS: 1 days A FACE TO FACE EVALUATION WAS PERFORMED  07/31/20 05/30/2020, 7:30 AM

## 2020-05-30 NOTE — Progress Notes (Signed)
Inpatient Rehabilitation Medication Review by a Pharmacist  A complete drug regimen review was completed for this patient to identify any potential clinically significant medication issues.  Clinically significant medication issues were identified:  no  Pharmacist comments: N/A  Time spent performing this drug regimen review (minutes):  10 minutes  Gerrit Halls, PharmD  05/30/2020 12:59 PM

## 2020-05-30 NOTE — Evaluation (Signed)
Physical Therapy Assessment and Plan  Patient Details  Name: [name] MRN: [id number] Date of Birth: 08/29/1946  PT Diagnosis: Difficulty walking, Edema, Impaired sensation, Muscle weakness, Osteoarthritis and Pain in joint Rehab Potential: Good ELOS: 10-12 days   Today's Date: 05/30/2020 PT Individual Time: 0900-1000 PT Individual Time Calculation (min): 60 min    Hospital Problem: Active Problems:   HNP (herniated nucleus pulposus) with myelopathy, cervical   Spinal cord compression [facility])   Past Medical History:  Past Medical History:  Diagnosis Date  . Alcohol abuse 04/25/2015  . Allergy   . Anastomotic ulcer S/P gastric bypass 06/18/2016  . Arthritis   . Cataract   . Degenerative disc disease, cervical   . GAD (generalized anxiety disorder) 06/14/2016  . HTN (hypertension) 04/25/2015  . Hypertension   . Hypothyroidism   . 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 Substance abuse ([place])    Alcohol abuse 2016  . UGIB (upper gastrointestinal bleed) 04/25/2015   Past Surgical History:  Past Surgical History:  Procedure Laterality Date  . ANTERIOR CERVICAL DECOMP/DISCECTOMY FUSION Left 05/26/2020   Procedure: Cervical seven - Thoracic one ANTERIOR CERVICAL DISCECTOMY AND PLATING;  Surgeon: Newman Pies, MD;  Location: [place];  Service: Neurosurgery;  Laterality: Left;  . CHOLECYSTECTOMY    . COSMETIC SURGERY    . DIAGNOSTIC LAPAROSCOPY    . ESOPHAGOGASTRODUODENOSCOPY N/A 04/25/2015   Procedure: ESOPHAGOGASTRODUODENOSCOPY (EGD);  Surgeon: Jerene Bears, MD;  Location[facility] ENDOSCOPY;  Service: Endoscopy;  Laterality: N/A;  . FRACTURE SURGERY    . GASTRIC BYPASS    . NM MYOVIEW LTD  10/10/2017   Normal LV size and function EF 72%.  Medium sized mild severity defect in the apical septal apical lateral and apical wall suggestive of breast attenuation.  LOW RISK.  No ischemia or infarction.    . OPEN REDUCTION INTERNAL  FIXATION (ORIF) DISTAL RADIAL FRACTURE Right 01/15/2013   Procedure: OPEN REDUCTION INTERNAL FIXATION (ORIF) Right DISTAL RADIUS FRACTURE;  Surgeon: Marybelle Killings, MD;  Location: [facility];  Service: Orthopedics;  Laterality: Right;  . TRANSTHORACIC ECHOCARDIOGRAM  10/04/2017   Mild LVH.  EF 60-65%.  Grade 2/moderate diastolic dysfunction.  Mild pulmonary hypertension.  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 Presented 05/24/2020 left leg weakness. Denies any recent fall. No bowel or bladder disturbances. Admission chemistries unremarkable except sodium 133, urinalysis negative nitrite, hemoglobin 12.8. CT/MRI of the brain negative for acute changes. MRI of the spine showed multilevel degenerative changes throughout the lumbar spine negative for fracture. Mild spinal stenosis L3-4 with mild subarticular stenosis bilaterally. Moderate spinal stenosis L4-5. Cervical MRI demonstrated a large herniated disc at C7-T1. Patient underwent C7-T1 anterior cervical discectomy with decompression, interbody arthrodesis with insertion of interbody prosthesis 05/27/2020 per Dr. Arnoldo Morale. Decadron protocol as indicated and course has been completed. She was placed in a cervical collar Patient transferred to CIR on 05/29/2020 .   Patient currently requires mod with mobility secondary to muscle weakness and muscle joint tightness, decreased cardiorespiratoy endurance, unbalanced muscle activation and decreased coordination and decreased sitting balance, decreased standing balance, decreased postural control and decreased balance strategies.  Prior to hospitalization, patient was modified independent  with mobility and lived with Spouse in  a House home.  Home access is 2Stairs to  enter.  Patient will benefit from skilled PT intervention to maximize safe functional mobility, minimize fall risk and decrease caregiver burden for planned discharge home with 24 hour supervision.  Anticipate patient will benefit from follow up [place] at discharge.  PT - End of Session Activity Tolerance: Decreased this session Endurance Deficit: Yes PT Assessment Rehab Potential (ACUTE/IP ONLY): Good PT Patient demonstrates impairments in the following area(s): Balance;Edema;Endurance;Motor;Pain;Sensory PT Transfers Functional Problem(s): Bed Mobility;Bed to Chair;Car;Furniture PT Locomotion Functional Problem(s): Ambulation;Wheelchair Mobility;Stairs PT Plan PT Intensity: Minimum of 1-2 x/day ,45 to 90 minutes PT Frequency: 5 out of 7 days PT Duration Estimated Length of Stay: 10-12 days PT Treatment/Interventions: Ambulation/gait training;Balance/vestibular training;Community reintegration;Discharge planning;Disease management/prevention;DME/adaptive equipment instruction;Functional mobility training;Neuromuscular re-education;Pain management;Patient/family education;Psychosocial support;Skin care/wound management;Splinting/orthotics;Stair training;Therapeutic Activities;Therapeutic Exercise;UE/LE Strength taining/ROM;UE/LE Coordination activities;Wheelchair propulsion/positioning PT Transfers Anticipated Outcome(s): mod I basic transfers; supervision car PT Locomotion Anticipated Outcome(s): supervision gait; min assist stairs PT Recommendation Recommendations for Other Services: Therapeutic Recreation consult Therapeutic Recreation Interventions: Stress management;Outing/community reintergration;Kitchen group Follow Up Recommendations: Home health PT;24 hour supervision/assistance Patient destination: Home Equipment Recommended: To be determined (pt likely has all equipment needed) Equipment Details: has SPC, rollator, walker, transport chair, and BSC  Skilled Therapeutic  Intervention Evaluation completed (see details above and below) with education on PT POC and goals and individual treatment initiated with focus on functional transfers with use of RW for support, short distance gait with RW, simulated car transfer, and education on energy conservation. Pt requires min to mod facilitation for anterior weightshift during sit <> stands and cues for positioning of feet especially L due to decreased sensation and strength. With RW, pt able to gait with min to mod assist x 15' with facilitation for weightshift for clearance of LLE due to decreased hip flexion. Pt able to perform dynamic standing balance for clothing management and hygiene (incontinent of urine due to time to get to bathroom) with 1 UE support and min assist or mod assist without UE support. Assist needed to thread LLE due to decreased strength but pt able to pull up over hips with min for standing balance. Mod assist transfers without RW for stand pivot with decreased clearance noted with LLE. Pt requires mod to max assist for car transfer for sit > stand as well as needs assist to bring BLE into the car. Pt does report overall fatigue at end of session but able to perform mod assist stand pivot transfer to recliner with BLE elevated for edema control and comfort.    PT Evaluation Precautions/Restrictions Precautions Precautions: Fall;Cervical Precaution Comments: Verbally reviewed cervical precautions.  Restrictions Weight Bearing Restrictions: No Pain Reports unrated pain in LLE and generalized from arthritis. Premedicated per report.  Home Living/Prior Functioning Home Living Available Help at Discharge: Family;Available 24 hours/day Type of Home: House Home Access: Stairs to enter [employer] of Steps: 2 Entrance Stairs-Rails: Right Home Layout: Two level;1/2 bath on main level;Bed/bath upstairs Bathroom Shower/Tub: Multimedia programmer: Standard Additional Comments:  reports that she has been sleeping on the main level due to increased difficulty with the stairs. the couch can turn into a bed. 1/2 bath on main.  Lives With: Spouse Prior Function Level of Independence: Independent with gait;Independent with transfers;Requires assistive device for independence;Needs assistance with ADLs  Able to Take Stairs?: Yes Vocation: Retired Biomedical scientist: retired Therapist, sports Comments: pt was driving and independent with self care except ocassional assist with LB Vision/Perception  Perception Perception: Within  Functional Limits Praxis Praxis: Intact  Cognition Overall Cognitive Status: Within Functional Limits for tasks assessed Arousal/Alertness: Awake/alert Memory: Appears intact Immediate Memory Recall: Sock;Blue;Bed Memory Recall Sock: Without Cue Memory Recall Blue: Without Cue Memory Recall Bed: Without Cue Awareness: Appears intact Problem Solving: Appears intact Safety/Judgment: Appears intact Sensation Sensation Light Touch: Impaired Detail Peripheral sensation comments: h/o neuropathy but reports sensation in LLE is worse since sx Light Touch Impaired Details: Impaired LLE Proprioception: Impaired Detail Proprioception Impaired Details: Impaired LLE Coordination Gross Motor Movements are Fluid and Coordinated: No Fine Motor Movements are Fluid and Coordinated: Yes Motor  Motor Motor: Abnormal postural alignment and control Motor - Skilled Clinical Observations: LLE weakness > RLE   Mobility Bed Mobility Bed Mobility: Rolling Left;Supine to Sit Rolling Left: Moderate Assistance - Patient 50-74% Supine to Sit: Maximal Assistance - Patient - Patient 25-49% Transfers Transfers: Sit to Stand;Stand Pivot Transfers;Stand to Sit Sit to Stand: Moderate Assistance - Patient 50-74% Stand to Sit: Moderate Assistance - Patient 50-74% Stand Pivot Transfers: Moderate Assistance - Patient 50 - 74% Locomotion  Gait Gait Distance (Feet): 15  Feet Assistive device: Rolling walker Gait Gait Pattern: Impaired (poor clearance of LLE; faciliation of weightshift) Stairs / Additional Locomotion Stairs: No Wheelchair Mobility Wheelchair Mobility: No  Trunk/Postural Assessment  Cervical Assessment Cervical Assessment: Exceptions to [facility] (cervical collar limiting) Thoracic Assessment Thoracic Assessment: Exceptions to [facility] (rounded shoulders) Lumbar Assessment Lumbar Assessment: Exceptions to [facility] (post pelvic tilt in sitting) Postural Control Postural Control: Within Functional Limits  Balance Balance Balance Assessed: Yes Static Sitting Balance Static Sitting - Level of Assistance: 5: Stand by assistance Dynamic Sitting Balance Dynamic Sitting - Level of Assistance: 5: Stand by assistance Static Standing Balance Static Standing - Level of Assistance: 4: Min assist Dynamic Standing Balance Dynamic Standing - Level of Assistance: 3: Mod assist Extremity Assessment  RUE Assessment RUE Assessment: Within Functional Limits General Strength Comments: grossly 4 to 4+/5 LUE Assessment LUE Assessment: Within Functional Limits General Strength Comments: grossly 4 to 4+/5 but pt reports some nerve pain at times RLE Assessment RLE Assessment: Exceptions to [facility] General Strength Comments: grossly 4/5 LLE Assessment LLE Assessment: Exceptions to [facility] General Strength Comments: 2-/5 at hip flexion; 3-/5 knee extension and ankle DF/PF    Refer to Care Plan for Long Term Goals  Recommendations for other services: Therapeutic Recreation  Kitchen group, Stress management and Outing/community reintegration  Discharge Criteria: Patient will be discharged from PT if patient refuses treatment 3 consecutive times without medical reason, if treatment goals not met, if there is a change in medical status, if patient makes no progress towards goals or if patient is discharged from hospital.  The above assessment, treatment plan, treatment  alternatives and goals were discussed and mutually agreed upon: by patient    Session #2: 1414-1423 (9 min) Individual therapy; No reports of pain. Followed up with patient in regards to a scheduling request made this morning. Husband present at bedside and educated on her current functional level and overall POC for PT as well as goals for mobility. Discussed home set-up further and set up of stairs in the garage where he is planning to have a ramp built. Currently R rail access per report. Pt reporting edema in LLE has decreased since admission but she does wear compression stockings at home PTA. Recommended that husband bring these in (agreeable) for edema control and pt now has shoes also. Recommending shoes to be worn due to impaired sensation in BLE (LLE >  RLE) and both in agreement. Declines OOB at this time stating she sat up enough this morning. All needs in reach.   Canary Brim Ivory Broad, PT, DPT, CBIS  05/30/2020, 2:37 PM

## 2020-05-30 NOTE — Evaluation (Signed)
Occupational Therapy Assessment and Plan  Patient Details  Name: [name] MRN: [id number] Date of Birth: 1946/07/05  OT Diagnosis: abnormal posture, acute pain and muscle weakness (generalized) Rehab Potential:   ELOS: 10-12 days   Today's Date: 05/30/2020 OT Individual Time: 1057-1200 OT Individual Time Calculation (min): 63 min     Hospital Problem: Active Problems:   HNP (herniated nucleus pulposus) with myelopathy, cervical   Spinal cord compression [facility])   Past Medical History:  Past Medical History:  Diagnosis Date  . Alcohol abuse 04/25/2015  . Allergy   . Anastomotic ulcer S/P gastric bypass 06/18/2016  . Arthritis   . Cataract   . Degenerative disc disease, cervical   . GAD (generalized anxiety disorder) 06/14/2016  . HTN (hypertension) 04/25/2015  . Hypertension   . Hypothyroidism   . 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 Substance abuse ([place])    Alcohol abuse 2016  . UGIB (upper gastrointestinal bleed) 04/25/2015   Past Surgical History:  Past Surgical History:  Procedure Laterality Date  . ANTERIOR CERVICAL DECOMP/DISCECTOMY FUSION Left 05/26/2020   Procedure: Cervical seven - Thoracic one ANTERIOR CERVICAL DISCECTOMY AND PLATING;  Surgeon: Newman Pies, MD;  Location: [place];  Service: Neurosurgery;  Laterality: Left;  . CHOLECYSTECTOMY    . COSMETIC SURGERY    . DIAGNOSTIC LAPAROSCOPY    . ESOPHAGOGASTRODUODENOSCOPY N/A 04/25/2015   Procedure: ESOPHAGOGASTRODUODENOSCOPY (EGD);  Surgeon: Jerene Bears, MD;  Location: [facility] ENDOSCOPY;  Service: Endoscopy;  Laterality: N/A;  . FRACTURE SURGERY    . GASTRIC BYPASS    . NM MYOVIEW LTD  10/10/2017   Normal LV size and function EF 72%.  Medium sized mild severity defect in the apical septal apical lateral and apical wall suggestive of breast attenuation.  LOW RISK.  No ischemia or infarction.    . OPEN REDUCTION INTERNAL FIXATION (ORIF) DISTAL RADIAL  FRACTURE Right 01/15/2013   Procedure: OPEN REDUCTION INTERNAL FIXATION (ORIF) Right DISTAL RADIUS FRACTURE;  Surgeon: Marybelle Killings, MD;  Location: [place];  Service: Orthopedics;  Laterality: Right;  . TRANSTHORACIC ECHOCARDIOGRAM  10/04/2017   Mild LVH.  EF 60-65%.  Grade 2/moderate diastolic dysfunction.  Mild pulmonary hypertension.  No valve lesions    Assessment & Plan Clinical Impression: LyndaH. 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 Per chart review patient lives with spouse. Two-level home half bath on main level bed and bath upstairs and 2 steps to entry. She walks with a cane outside the home. Husband does assist with some basic ADLs. Presented 05/24/2020 left leg weakness. Denies any recent fall. No bowel or bladder disturbances. Admission chemistries unremarkable except sodium 133, urinalysis negative nitrite, hemoglobin 12.8. CT/MRI of the brain negative for acute changes. MRI of the spine showed multilevel degenerative changes throughout the lumbar spine negative for fracture. Mild spinal stenosis L3-4 with mild subarticular stenosis bilaterally. Moderate spinal stenosis L4-5. Cervical MRI demonstrated a large herniated disc at C7-T1. Patient underwent C7-T1 anterior cervical discectomy with decompression, interbody arthrodesis with insertion of interbody prosthesis 05/27/2020 per Dr. Arnoldo Morale. Decadron protocol as indicated and course has been completed. She was placed in a cervical collar. Therapy evaluations completed and patient was admitted for a comprehensive rehab program.   Patient currently requires mod with basic self-care skills secondary to muscle weakness, decreased cardiorespiratoy  endurance, decreased coordination and decreased standing balance, decreased postural control and  decreased balance strategies.  Prior to hospitalization, patient could complete most BADLs (except donning socks/shoes) with modified independent .  Patient will benefit from skilled intervention to decrease level of assist with basic self-care skills and increase independence with basic self-care skills prior to discharge home with care partner.  Anticipate patient will require intermittent supervision and follow up home health.  OT - End of Session Activity Tolerance: Tolerates 10 - 20 min activity with multiple rests Endurance Deficit: Yes OT Assessment OT Barriers to Discharge: Medical stability;Home environment access/layout OT Patient demonstrates impairments in the following area(s): Balance;Endurance;Motor;Pain;Safety OT Basic ADL's Functional Problem(s): Bathing;Dressing;Toileting;Grooming OT Transfers Functional Problem(s): Toilet;Tub/Shower OT Plan OT Intensity: Minimum of 1-2 x/day, 45 to 90 minutes OT Frequency: 5 out of 7 days OT Duration/Estimated Length of Stay: 10-12 days OT Treatment/Interventions: Balance/vestibular training;Discharge planning;Pain management;Self Care/advanced ADL retraining;Therapeutic Activities;UE/LE Coordination activities;Disease mangement/prevention;Functional mobility training;Patient/family education;Therapeutic Exercise;UE/LE Strength taining/ROM;Psychosocial support;Neuromuscular re-education;DME/adaptive equipment instruction;Community reintegration OT Self Feeding Anticipated Outcome(s): Independent OT Basic Self-Care Anticipated Outcome(s): Mod I - Supervision OT Toileting Anticipated Outcome(s): Mod I OT Bathroom Transfers Anticipated Outcome(s): Mod I - Supervision OT Recommendation Patient destination: Home Follow Up Recommendations: Home health OT (pending transportation) Equipment Recommended: To be determined Equipment Details: pt already has BSC, RW, cane, transport chair   Skilled Therapeutic Intervention Pt greeted at time of  session sitting up in recliner agreeable to OT eval. Discussed with pt the purpose of OT to increase indep with ADLs, pt in agreement with POC. Ambulated short distance to sink with Min A with RW and extended time to coordinate LLE and once at sink level, performed oral care (partial dentures as well), grooming, hygiene in sitting with set up/supervision. Pt was tired and brought via wheelchair to bathroom, performed stand pivot with Min A and extended time to manage LLE, Mod for clothing management but able to perform periarea hygiene. Sit to stands from various surface Min A throughout session. While seated on toilet performed UB/LB bathing with supervision for UB bathing and Mod for LB bathing d/t cervical collar and restrictions. Pt dried off in the same manner and brought via wheelchair back to room and ambulated short distance to bed with Min A to manage RW during turning to sit on bed, sit to supine Min/Mod A to manage LLE. Reviewed cervical precautions and practical application for ADL tasks that will need AE or assistance to complete. Pt verbally agreed. Pt left in bed with all needs met, call bell in reach, alarm on.   OT Evaluation Precautions/Restrictions  Precautions Precautions: Fall;Cervical Precaution Comments: Verbally reviewed cervical precautions.  Restrictions Weight Bearing Restrictions: No Pain Pain Assessment Pain Scale: 0-10 Pain Score: 4  Pain Location: Leg Pain Orientation: Left Pain Descriptors / Indicators: Aching;[place] expects to be discharged to:: Private residence Living Arrangements: Spouse/significant other Available Help at Discharge: Family, Available 24 hours/day Type of Home: House Home Access: Stairs to enter Technical brewer of Steps: 2 Entrance Stairs-Rails: Right Home Layout: Two level, 1/2 bath on main level, Bed/bath upstairs Bathroom Shower/Tub: Multimedia programmer:  Standard Additional Comments: reports that she has been sleeping on the main level due to increased difficulty with the stairs. the couch can turn into a bed. 1/2 bath on main.  Lives With: Spouse IADL History Occupation: Retired Type of Occupation: Curator ICU Prior Function Level of Independence: Independent with gait, Independent with transfers, Requires assistive  device for independence, Needs assistance with ADLs  Able to Take Stairs?: Yes Vocation: Retired Biomedical scientist: retired Therapist, sports Comments: pt was driving and independent with self care except ocassional assist with LB ADL ADL Grooming: Setup Where Assessed-Grooming: Sitting at sink Upper Body Bathing: Supervision/safety Where Assessed-Upper Body Bathing: Other (Comment) (toilet level) Lower Body Bathing: Moderate assistance Where Assessed-Lower Body Bathing: Other (Comment) (toilet level) Upper Body Dressing: Minimal assistance Lower Body Dressing: Moderate assistance Toileting: Moderate assistance Toilet Transfer: Minimal assistance Toilet Transfer Method: Stand pivot Vision Baseline Vision/History: Wears glasses Wears Glasses: At all times Patient Visual Report: No change from baseline Perception  Perception: Within Functional Limits Praxis Praxis: Intact Cognition Overall Cognitive Status: Within Functional Limits for tasks assessed Arousal/Alertness: Awake/alert Orientation Level: Person;Place;Situation Person: Oriented Place: Oriented Situation: Oriented Year: 2021 Month: July Day of Week: Correct Memory: Appears intact Immediate Memory Recall: Sock;Blue;Bed Memory Recall Sock: Without Cue Memory Recall Blue: Without Cue Memory Recall Bed: Without Cue Awareness: Appears intact Problem Solving: Appears intact Safety/Judgment: Appears intact Sensation Sensation Light Touch: Impaired Detail Peripheral sensation comments: h/o neuropathy but reports sensation in LLE is worse since sx Light Touch  Impaired Details: Impaired LLE Proprioception: Impaired Detail Proprioception Impaired Details: Impaired LLE Coordination Gross Motor Movements are Fluid and Coordinated: No Fine Motor Movements are Fluid and Coordinated: Yes Motor  Motor Motor: Abnormal postural alignment and control Motor - Skilled Clinical Observations: (P) LLE weakness > RLE  Mobility  Bed Mobility Bed Mobility: (P) Rolling Left;Supine to Sit Rolling Left: (P) Moderate Assistance - Patient 50-74% Supine to Sit: (P) Maximal Assistance - Patient - Patient 25-49% Transfers Sit to Stand: (P) Moderate Assistance - Patient 50-74% Stand to Sit: (P) Moderate Assistance - Patient 50-74%  Trunk/Postural Assessment  Cervical Assessment Cervical Assessment: Exceptions to [facility] (cervical collar limiting) Thoracic Assessment Thoracic Assessment: Exceptions to [facility] (rounded shoulders) Lumbar Assessment Lumbar Assessment: Exceptions to [facility] (post pelvic tilt in sitting) Postural Control Postural Control: Within Functional Limits  Balance Balance Balance Assessed: Yes Static Sitting Balance Static Sitting - Level of Assistance: (P) 5: Stand by assistance Dynamic Sitting Balance Dynamic Sitting - Level of Assistance: (P) 5: Stand by assistance Static Standing Balance Static Standing - Level of Assistance: (P) 4: Min assist Dynamic Standing Balance Dynamic Standing - Level of Assistance: (P) 3: Mod assist Extremity/Trunk Assessment RUE Assessment RUE Assessment: Within Functional Limits General Strength Comments: grossly 4 to 4+/5 LUE Assessment LUE Assessment: Within Functional Limits General Strength Comments: grossly 4 to 4+/5 but pt reports some nerve pain at times     Refer to Care Plan for Long Term Goals  Recommendations for other services: None    Discharge Criteria: Patient will be discharged from OT if patient refuses treatment 3 consecutive times without medical reason, if treatment goals not met, if  there is a change in medical status, if patient makes no progress towards goals or if patient is discharged from hospital.  The above assessment, treatment plan, treatment alternatives and goals were discussed and mutually agreed upon: by patient  Viona Gilmore 05/30/2020, 12:55 PM

## 2020-05-30 NOTE — Progress Notes (Signed)
Bilateral lower extremity venous duplex has been completed. Preliminary results can be found in CV Proc through chart review.  Results were given to the patient's nurse, Lowella Bandy.  05/30/20 2:34 PM Olen Cordial RVT

## 2020-05-30 NOTE — Progress Notes (Signed)
Pt with screening dopplers positive for age indeterminate bilateral peroneal DVT. Pt is asymptomatic and has hx of venous stasis in LEs. Pt POD #3 post AcDF C7-T1.  Would recommend repeat LE Doppler in 5-7 days

## 2020-05-31 ENCOUNTER — Inpatient Hospital Stay (HOSPITAL_COMMUNITY): Payer: Medicare PPO | Admitting: Physical Therapy

## 2020-05-31 ENCOUNTER — Inpatient Hospital Stay (HOSPITAL_COMMUNITY): Payer: Medicare PPO

## 2020-05-31 ENCOUNTER — Inpatient Hospital Stay (HOSPITAL_COMMUNITY): Payer: Medicare PPO | Admitting: Occupational Therapy

## 2020-05-31 LAB — CBC WITH DIFFERENTIAL/PLATELET
Abs Immature Granulocytes: 0.1 10*3/uL — ABNORMAL HIGH (ref 0.00–0.07)
Basophils Absolute: 0 10*3/uL (ref 0.0–0.1)
Basophils Relative: 0 %
Eosinophils Absolute: 0.1 10*3/uL (ref 0.0–0.5)
Eosinophils Relative: 2 %
HCT: 38.8 % (ref 36.0–46.0)
Hemoglobin: 12.3 g/dL (ref 12.0–15.0)
Lymphocytes Relative: 21 %
Lymphs Abs: 1.4 10*3/uL (ref 0.7–4.0)
MCH: 35 pg — ABNORMAL HIGH (ref 26.0–34.0)
MCHC: 31.7 g/dL (ref 30.0–36.0)
MCV: 110.5 fL — ABNORMAL HIGH (ref 80.0–100.0)
Monocytes Absolute: 0.6 10*3/uL (ref 0.1–1.0)
Monocytes Relative: 9 %
Myelocytes: 1 %
Neutro Abs: 4.4 10*3/uL (ref 1.7–7.7)
Neutrophils Relative %: 67 %
Platelets: 219 10*3/uL (ref 150–400)
RBC: 3.51 MIL/uL — ABNORMAL LOW (ref 3.87–5.11)
RDW: 13.4 % (ref 11.5–15.5)
WBC: 6.5 10*3/uL (ref 4.0–10.5)
nRBC: 0 % (ref 0.0–0.2)
nRBC: 0 /100 WBC

## 2020-05-31 LAB — COMPREHENSIVE METABOLIC PANEL
ALT: 43 U/L (ref 0–44)
AST: 54 U/L — ABNORMAL HIGH (ref 15–41)
Albumin: 3 g/dL — ABNORMAL LOW (ref 3.5–5.0)
Alkaline Phosphatase: 86 U/L (ref 38–126)
Anion gap: 9 (ref 5–15)
BUN: 25 mg/dL — ABNORMAL HIGH (ref 8–23)
CO2: 29 mmol/L (ref 22–32)
Calcium: 9 mg/dL (ref 8.9–10.3)
Chloride: 97 mmol/L — ABNORMAL LOW (ref 98–111)
Creatinine, Ser: 0.91 mg/dL (ref 0.44–1.00)
GFR calc Af Amer: >60 mL/min (ref >60)
GFR calc non Af Amer: >60 mL/min (ref >60)
Glucose, Bld: 155 mg/dL — ABNORMAL HIGH (ref 70–99)
Potassium: 4.3 mmol/L (ref 3.5–5.1)
Sodium: 135 mmol/L (ref 135–145)
Total Bilirubin: 0.9 mg/dL (ref 0.3–1.2)
Total Protein: 6.1 g/dL — ABNORMAL LOW (ref 6.5–8.1)

## 2020-05-31 LAB — GLUCOSE, CAPILLARY: Glucose-Capillary: 123 mg/dL — ABNORMAL HIGH (ref 70–99)

## 2020-05-31 NOTE — Progress Notes (Signed)
Occupational Therapy Session Note  Patient Details  Name: [name] MRN: [id number] Date of Birth: [birth year]  Today's Date: 05/31/2020 OT Individual Time: 0350-0938 OT Individual Time Calculation (min): 70 min    Short Term Goals: Week 1:  OT Short Term Goal 1 (Week 1): Pt will perform 3/3 toileting tasks with CGA OT Short Term Goal 2 (Week 1): Pt will perform LB dressing with AE as needed Min A OT Short Term Goal 3 (Week 1): Pt will perform various ADL transfers with CGA  Skilled Therapeutic Interventions/Progress Updates:    Pt resting in bed upon arrival and ready for therapy.  Pt agreeable to bathing/dressing with sit<>stand from w/c at sink.  Supine>sit EOB with HOB elevated with supervision.  Sit<>stand and stand pivot transfer with RW at min A. Toileting with CGA using grab bars. Toilet transfer with min A using grab bars. Pt required assistance with B feet and buttocks. Dressing with min A. Continued with discharge planning and DME requirements. Pt currently sleeps on furniture designed and built by husband that is between 18" and 21" high. Pt would like to get back to standing at kitchen counters for cooking. Pt remained in w/c with seat alarm activated and all needs within reach.   Therapy Documentation Precautions:  Precautions Precautions: Fall, Cervical Precaution Comments: Verbally reviewed cervical precautions.  Restrictions Weight Bearing Restrictions: No  Pain:  Pt c/o 2/10 upper back and neck pain; repositioned and emotional support   Therapy/Group: Individual Therapy  Rich Brave 05/31/2020, 12:10 PM

## 2020-05-31 NOTE — Progress Notes (Signed)
Physical Therapy Session Note  Patient Details  Name: [name] MRN: [id number] Date of Birth: [birth year]  Today's Date: 05/31/2020 PT Individual Time: 0900-1000 PT Individual Time Calculation (min): 60 min   Short Term Goals: Week 1:  PT Short Term Goal 1 (Week 1): Pt will be able to perform bed mobility with min assist PT Short Term Goal 2 (Week 1): Pt will be able to perform transfers with CGA PT Short Term Goal 3 (Week 1): Pt will be able to gait x 50' with min assist PT Short Term Goal 4 (Week 1): Pt will be able to perform 2 steps with mod assist  Skilled Therapeutic Interventions/Progress Updates:    Pt received seated in bed, agreeable to PT session. No complaints of pain. Bed mobility with min A. Assisted pt with donning a 2nd hospital gown and hard collar while seated EOB. Sit to stand with min A to RW. Stand pivot transfer bed to w/c with RW and CGA. Dependent transport via w/c to/from therapy gym for time conservation. Sit to stand with min A to RW throughout session. Ambulation 2 x 20 ft with RW and CGA, decreased L hip flexion noted with circumduction and hip hiking to advance limb during gait. Ascend/descend 3 x 3" stairs with 2 handrails and min A to ascend/mod A to descend. Pt relies heavily on BUE support during gait and stair navigation. Standing alt L/R 1" step-taps with RW and min A for balance with focus on L hip flexion strengthening. Pt unable to clear LLE onto step, decreased task to alt L/R flat disc taps in standing. Pt fatigues quickly with standing activity. Pt also asking about how to stretch her L gastroc in bed independently. Provided pt with gait belt with loop on the end and she is able to perform stretch independently at bed level. Pt requests to return to bed at end of session. Stand pivot transfer w/c to bed with RW and CGA. Sit to supine min A for LLE management. Pt left semi-reclined in bed with needs in reach at end of session.  Therapy  Documentation Precautions:  Precautions Precautions: Fall, Cervical Precaution Comments: Verbally reviewed cervical precautions.  Restrictions Weight Bearing Restrictions: No    Therapy/Group: Individual Therapy   Peter Congo, PT, DPT  05/31/2020, 12:11 PM

## 2020-05-31 NOTE — Progress Notes (Signed)
Occupational Therapy Session Note  Patient Details  Name: [name] MRN: [id number] Date of Birth: April 30, 1946  Today's Date: 05/31/2020 OT Individual Time: 6301-6010 OT Individual Time Calculation (min): 58 min   Short Term Goals: Week 1:  OT Short Term Goal 1 (Week 1): Pt will perform 3/3 toileting tasks with CGA OT Short Term Goal 2 (Week 1): Pt will perform LB dressing with AE as needed Min A OT Short Term Goal 3 (Week 1): Pt will perform various ADL transfers with CGA  Skilled Therapeutic Interventions/Progress Updates:    Pt greeted in bed with no c/o pain, agreeable to session and wanting to start by using the restroom. Pt completed supine<sit unassisted with HOB elevated and use of bed features. OT donned her c-collar EOB while spouse observed. CGA for ambulatory transfer to the toilet using RW. CGA for toileting tasks with pt having +bladder void. After handwashing at the sink, pt returned to EOB. She wanted a wider RW to increase comfort with mobility. OT retrieved a wider RW and had her practice mobility in the room. Pt reported like this walker much better and after she practiced toilet transfers with husband using this device. Hands on family training with husband completed in regards to donning c-collar, using gait belt while guarding pt on the Lt side, and RW safety/positioning. Spouse Jillyn Hidden able to return carryover of education and was cleared on safety plan to assist pt with toilet transfers. RN made aware. Pt transferred to bed at end of session and was left with all needs within reach.   Therapy Documentation Precautions:  Precautions Precautions: Fall, Cervical Precaution Comments: Verbally reviewed cervical precautions.  Restrictions Weight Bearing Restrictions: No Vital Signs: Therapy Vitals Temp: 98.7 F (37.1 C) Pulse Rate: 90 Resp: 18 BP: (!) 126/57 Patient Position (if appropriate): Lying Oxygen Therapy SpO2: 97 % O2 Device: Room Air Pain: Pain  Assessment Pain Scale: 0-10 Pain Score: 7  Pain Type: Chronic pain Pain Location: Back Pain Orientation: Lower ADL: ADL Grooming: Setup Where Assessed-Grooming: Sitting at sink Upper Body Bathing: Supervision/safety Where Assessed-Upper Body Bathing: Other (Comment) (toilet level) Lower Body Bathing: Moderate assistance Where Assessed-Lower Body Bathing: Other (Comment) (toilet level) Upper Body Dressing: Minimal assistance Lower Body Dressing: Moderate assistance Toileting: Moderate assistance Toilet Transfer: Minimal assistance Toilet Transfer Method: Stand pivot     Therapy/Group: Individual Therapy  Chaz Ronning A Cutter Passey 05/31/2020, 4:10 PM

## 2020-05-31 NOTE — Care Management (Addendum)
Inpatient Rehabilitation Center Individual Statement of Services  Patient Name:  [name]  Date:  05/31/2020  Welcome to the Inpatient Rehabilitation Center.  Our goal is to provide you with an individualized program based on your diagnosis and situation, designed to meet your specific needs.  With this comprehensive rehabilitation program, you will be expected to participate in at least 3 hours of rehabilitation therapies Monday-Friday, with modified therapy programming on the weekends.  Your rehabilitation program will include the following services:  Physical Therapy (PT), Occupational Therapy (OT), 24 hour per day rehabilitation nursing, Therapeutic Recreaction (TR), Psychology, Neuropsychology, Care Coordinator, Rehabilitation Medicine, Nutrition Services, Pharmacy Services and Other  Weekly team conferences will be held on Tuesday to discuss your progress.  Your Inpatient Rehabilitation Care Coordinator will talk with you frequently to get your input and to update you on team discussions.  Team conferences with you and your family in attendance may also be held.  Expected length of stay: 10-14 days   Overall anticipated outcome: Independent with an Assistive Device  Depending on your progress and recovery, your program may change. Your Inpatient Rehabilitation Care Coordinator will coordinate services and will keep you informed of any changes. Your Inpatient Rehabilitation Care Coordinator's name and contact numbers are listed  below.  The following services may also be recommended but are not provided by the Inpatient Rehabilitation Center:   Driving Evaluations  Home Health Rehabiltiation Services  Outpatient Rehabilitation Services  Vocational Rehabilitation   Arrangements will be made to provide these services after discharge if needed.  Arrangements include referral to agencies that provide these services.  Your insurance has been verified to be:  [employer]  Your primary doctor is:  Mayer Masker  Pertinent information will be shared with your doctor and your insurance company.  Inpatient Rehabilitation Care Coordinator:  Susie Cassette [phone] or [phone]  Information discussed with and copy given to patient by: Gretchen Short, 05/31/2020, 9:57 AM

## 2020-06-01 ENCOUNTER — Inpatient Hospital Stay (HOSPITAL_COMMUNITY): Payer: Medicare PPO | Admitting: Occupational Therapy

## 2020-06-01 ENCOUNTER — Inpatient Hospital Stay (HOSPITAL_COMMUNITY): Payer: Medicare PPO | Admitting: Physical Therapy

## 2020-06-01 ENCOUNTER — Inpatient Hospital Stay (HOSPITAL_COMMUNITY): Payer: Medicare PPO

## 2020-06-01 MED ORDER — BENAZEPRIL HCL 20 MG PO TABS
20.0000 mg | ORAL_TABLET | Freq: Every day | ORAL | Status: DC
  Administered 2020-06-02 (×2): 20 mg via ORAL
  Filled 2020-06-01: qty 1

## 2020-06-01 MED ORDER — HYDROCHLOROTHIAZIDE 12.5 MG PO CAPS
12.5000 mg | ORAL_CAPSULE | Freq: Every day | ORAL | Status: DC
  Administered 2020-06-02: 12.5 mg via ORAL
  Filled 2020-06-01: qty 1

## 2020-06-01 NOTE — IPOC Note (Addendum)
Overall Plan of Care [facility]) Patient Details Name: [name] MRN: [id number] DOB: [birth year]  Admitting Diagnosis: HNP (herniated nucleus pulposus) with myelopathy, cervical  Hospital Problems: Principal Problem:   HNP (herniated nucleus pulposus) with myelopathy, cervical Active Problems:   Spinal cord compression [facility])     Functional Problem List: Nursing Bladder, Bowel, Edema, Endurance, Medication Management, Motor, Pain, Perception, Safety, Sensory, Skin Integrity  PT Balance, Edema, Endurance, Motor, Pain, Sensory  OT Balance, Endurance, Motor, Pain, Safety  SLP    TR         Basic ADL's: OT Bathing, Dressing, Toileting, Grooming     Advanced  ADL's: OT       Transfers: PT Bed Mobility, Bed to Chair, Car, Occupational psychologist, Research scientist (life sciences): PT Ambulation, Psychologist, prison and probation services, Stairs     Additional Impairments: OT    SLP        TR      Anticipated Outcomes Item Anticipated Outcome  Self Feeding Independent  Swallowing      Basic self-care  Mod I - Supervision  Toileting  Mod I   Bathroom Transfers Mod I - Supervision  Bowel/Bladder  Patient will be min I with B&B  Transfers  mod I basic transfers; supervision car  Locomotion  supervision gait; min assist stairs  Communication     Cognition     Pain  pain goal of 3 on pain scale of 0-10  Safety/Judgment  Patient will be Min I assist with safety/judgement   Therapy Plan: PT Intensity: Minimum of 1-2 x/day ,45 to 90 minutes PT Frequency: 5 out of 7 days PT Duration Estimated Length of Stay: 10-12 days OT Intensity: Minimum of 1-2 x/day, 45 to 90 minutes OT Frequency: 5 out of 7 days OT Duration/Estimated Length of Stay: 10-12 days     Due to the current state of emergency, patients may not be receiving their 3-hours of Medicare-mandated therapy.   Team Interventions: Nursing Interventions Patient/Family Education, Pain Management, Bladder Management, Medication  Management, Discharge Planning, Bowel Management, Disease Management/Prevention  PT interventions Ambulation/gait training, Warden/ranger, Community reintegration, Discharge planning, Disease management/prevention, DME/adaptive equipment instruction, Functional mobility training, Neuromuscular re-education, Pain management, Patient/family education, Psychosocial support, Skin care/wound management, Splinting/orthotics, Stair training, Therapeutic Activities, Therapeutic Exercise, UE/LE Strength taining/ROM, UE/LE Coordination activities, Wheelchair propulsion/positioning  OT Interventions Balance/vestibular training, Discharge planning, Pain management, Self Care/advanced ADL retraining, Therapeutic Activities, UE/LE Coordination activities, Disease mangement/prevention, Functional mobility training, Patient/family education, Therapeutic Exercise, UE/LE Strength taining/ROM, Psychosocial support, Neuromuscular re-education, DME/adaptive equipment instruction, Community reintegration  SLP Interventions    TR Interventions    SW/CM Interventions Discharge Planning, Psychosocial Support, Patient/Family Education   Barriers to Discharge MD  Medical stability  Nursing Inaccessible home environment, Decreased caregiver support, Lack of/limited family support    PT      OT Medical stability, Home environment access/layout    SLP      SW Decreased caregiver support, Lack of/limited family support     Team Discharge Planning: Destination: PT-Home ,OT- Home , SLP-  Projected Follow-up: PT-Home health PT, 24 hour supervision/assistance, OT-  Home health OT (pending transportation), SLP-  Projected Equipment Needs: PT-To be determined (pt likely has all equipment needed), OT- To be determined, SLP-  Equipment Details: PT-has SPC, rollator, walker, transport chair, and BSC, OT-pt already has BSC, RW, cane, transport chair Patient/family involved in discharge planning: PT- Patient,   OT-Patient, SLP-   MD ELOS: 7-10 days Medical Rehab Prognosis:  Excellent Assessment: [name] is a 74 year old woman who is admitted to CIR with left leg weaknesssecondary to large herniated disc at C7-T1/cord compression. Status post anterior cervical discectomy with decompression 05/27/2020. Her pain is currently well controlled with Neurontin, Flexeril, Oxycodone, and Tramadol. She has Xanax as needed for anxiety. Dopplers were positive for indeterminate bilateral peroneal DVT. Will repeat dopplers on 7/9.   See Team Conference Notes for weekly updates to the plan of care

## 2020-06-01 NOTE — Progress Notes (Signed)
Occupational Therapy Session Note  Patient Details  Name: [name] MRN: [id number] Date of Birth: 1946/06/25  Today's Date: 06/01/2020 OT Individual Time: 1000-1100 OT Individual Time Calculation (min): 60 min    Short Term Goals: Week 1:  OT Short Term Goal 1 (Week 1): Pt will perform 3/3 toileting tasks with CGA OT Short Term Goal 2 (Week 1): Pt will perform LB dressing with AE as needed Min A OT Short Term Goal 3 (Week 1): Pt will perform various ADL transfers with CGA  Skilled Therapeutic Interventions/Progress Updates:    OT intervention with focus on bed mobility, sit<>stand, standing balance, functional amb with RW, and BADLs including bathing/dressing with sit<>stand from w/c at sink. See Care Tool for assist levels. Supine>sit EOB with supervision and HOB elevated. Sit<>stand and functional amb with RW to bathroom with CGA.  Pt requires more then a reasonable amount of time for all tasks.  Pt commented on how fatigued she was getting during session and was surprised at amount of energy required for bathing/dressing. Pt requested assistance lifting LLE into bed. Pt remained in bed with all needs within reach and bed alarm activated.   Therapy Documentation Precautions:  Precautions Precautions: Fall, Cervical Precaution Comments: Verbally reviewed cervical precautions.  Restrictions Weight Bearing Restrictions: No Pain:  Pt c/o pain in B upper arms and L "calf"; repositioned and emotional support, meds admin prior to therapy   Therapy/Group: Individual Therapy  [name] 06/01/2020, 2:20 PM

## 2020-06-01 NOTE — Progress Notes (Signed)
Occupational Therapy Session Note  Patient Details  Name: [name] MRN: [id number] Date of Birth: [birth year]  Today's Date: 06/01/2020 OT Individual Time: 1415-1530 OT Individual Time Calculation (min): 75 min    Short Term Goals: Week 1:  OT Short Term Goal 1 (Week 1): Pt will perform 3/3 toileting tasks with CGA OT Short Term Goal 2 (Week 1): Pt will perform LB dressing with AE as needed Min A OT Short Term Goal 3 (Week 1): Pt will perform various ADL transfers with CGA  Skilled Therapeutic Interventions/Progress Updates:    Patient in bed, states that she just finished long PT session and she is tired but willing to participate in therapy session.  Husband present for session.  She notes having had pain that radiates down her arms during activity - none present currently when supported by bed surface.  Completed bilateral scapular and shoulder mobility activities in supine/reclined position in bed without pain.  Supine to sitting edge of bed with mod A.  Donned cervical collar max A.  She attempted trunk and positioning activities in short sit position but was unable to tolerate due to pain (forward head/kyphotic posture noted) - returned to supine position mod A.  Removed collar when supported by bed and pain resolved.  She tolerated shoulder, elbow, wrist and hand activities in semi reclined position with good tolerance - ongoing reinforcement of scapular positioning as she tends to elevate and roll shoulders forward during activity.  Provided and practiced theraputty activities with good tolerance.  She remained in bed at close of session, bed alarm set and call bell in reach.    Therapy Documentation Precautions:  Precautions Precautions: Fall, Cervical Precaution Comments: Verbally reviewed cervical precautions.  Restrictions Weight Bearing Restrictions: No  Therapy/Group: Individual Therapy  Barrie Lyme 06/01/2020, 7:52 AM

## 2020-06-01 NOTE — Progress Notes (Signed)
[facility] PHYSICAL MEDICINE & REHABILITATION PROGRESS NOTE   Subjective/Complaints: Has no complaints this morning. Pain is well controlled.  Moving bowels regularly. Goal is to start walking again  ROS- denies CP , SOB, N/V/D   Objective:   VAS [place] LOWER EXTREMITY VENOUS (DVT)  Result Date: 05/31/2020  Lower Venous DVTStudy Indications: Swelling.  Risk Factors: None identified. Limitations: Body habitus and poor ultrasound/tissue interface. Comparison Study: No prior studies. Performing Technologist: Chanda Busing RVT  Examination Guidelines: A complete evaluation includes B-mode imaging, spectral Doppler, color Doppler, and power Doppler as needed of all accessible portions of each vessel. Bilateral testing is considered an integral part of a complete examination. Limited examinations for reoccurring indications may be performed as noted. The reflux portion of the exam is performed with the patient in reverse Trendelenburg.  +---------+---------------+---------+-----------+----------+-----------------+ RIGHT    CompressibilityPhasicitySpontaneityPropertiesThrombus Aging    +---------+---------------+---------+-----------+----------+-----------------+ CFV      Full           Yes      Yes                                    +---------+---------------+---------+-----------+----------+-----------------+ SFJ      Full                                                           +---------+---------------+---------+-----------+----------+-----------------+ FV Prox  Full                                                           +---------+---------------+---------+-----------+----------+-----------------+ FV Mid   Full                                                           +---------+---------------+---------+-----------+----------+-----------------+ FV DistalFull                                                            +---------+---------------+---------+-----------+----------+-----------------+ PFV      Full                                                           +---------+---------------+---------+-----------+----------+-----------------+ POP      Full           Yes      Yes                                    +---------+---------------+---------+-----------+----------+-----------------+ PTV      Full                                                           +---------+---------------+---------+-----------+----------+-----------------+  PERO     Partial                                      Age Indeterminate +---------+---------------+---------+-----------+----------+-----------------+   +---------+---------------+---------+-----------+----------+-----------------+ LEFT     CompressibilityPhasicitySpontaneityPropertiesThrombus Aging    +---------+---------------+---------+-----------+----------+-----------------+ CFV      Full           Yes      Yes                                    +---------+---------------+---------+-----------+----------+-----------------+ SFJ      Full                                                           +---------+---------------+---------+-----------+----------+-----------------+ FV Prox  Full                                                           +---------+---------------+---------+-----------+----------+-----------------+ FV Mid   Full                                                           +---------+---------------+---------+-----------+----------+-----------------+ FV DistalFull                                                           +---------+---------------+---------+-----------+----------+-----------------+ PFV      Full                                                           +---------+---------------+---------+-----------+----------+-----------------+ POP      Full           Yes      Yes                                     +---------+---------------+---------+-----------+----------+-----------------+ PTV      Full                                                           +---------+---------------+---------+-----------+----------+-----------------+ PERO     None  Age Indeterminate +---------+---------------+---------+-----------+----------+-----------------+     Summary: RIGHT: - Findings consistent with age indeterminate deep vein thrombosis involving the right peroneal veins. - No cystic structure found in the popliteal fossa.  LEFT: - Findings consistent with age indeterminate deep vein thrombosis involving the left peroneal veins. - No cystic structure found in the popliteal fossa.  *See table(s) above for measurements and observations. Electronically signed by Sherald Hess MD on 05/31/2020 at 10:53:57 AM.    Final    Recent Labs    05/31/20 0752  WBC 6.5  HGB 12.3  HCT 38.8  PLT 219   Recent Labs    05/31/20 0752  NA 135  K 4.3  CL 97*  CO2 29  GLUCOSE 155*  BUN 25*  CREATININE 0.91  CALCIUM 9.0    Intake/Output Summary (Last 24 hours) at 06/01/2020 1124 Last data filed at 06/01/2020 0801 Gross per 24 hour  Intake 842 ml  Output --  Net 842 ml     Physical Exam: Vital Signs Blood pressure 105/61, pulse 80, temperature 98.1 F (36.7 C), resp. rate 18, height 5\' 5"  (1.651 m), weight 105.4 kg, SpO2 94 %.  General: Alert and oriented x 3, No apparent distress HEENT: Head is normocephalic, atraumatic, PERRLA, EOMI, sclera anicteric, oral mucosa pink and moist, dentition intact, ext ear canals clear,  Neck: Supple without JVD or lymphadenopathy Heart: Reg rate and rhythm. No murmurs rubs or gallops Chest: CTA bilaterally without wheezes, rales, or rhonchi; no distress Abdomen: Soft, non-tender, non-distended, bowel sounds positive. Extremities: No clubbing, cyanosis, or edema. Pulses are 2+ Skin: Spinal Incision  C/D/I Neurologic: Cranial nerves II through XII intact, motor strength is 4/5 in bilateral deltoid, bicep, tricep, grip, HI hip flexor, knee extensors, ankle dorsiflexor and plantar flexor  Musculoskeletal: Full range of motion in all 4 extremities. No joint swelling   Assessment/Plan: 1. Functional deficits secondary to C7-T1 cord compression with myelopathy , s/p decompression  which require 3+ hours per day of interdisciplinary therapy in a comprehensive inpatient rehab setting.  Physiatrist is providing close team supervision and 24 hour management of active medical problems listed below.  Physiatrist and rehab team continue to assess barriers to discharge/monitor patient progress toward functional and medical goals  Care Tool:  Bathing    Body parts bathed by patient: Right arm, Left arm, Chest, Abdomen, Front perineal area, Right upper leg, Left upper leg, Face   Body parts bathed by helper: Buttocks, Right lower leg, Left lower leg     Bathing assist Assist Level: Minimal Assistance - Patient > 75%     Upper Body Dressing/Undressing Upper body dressing   What is the patient wearing?: Hospital gown only    Upper body assist Assist Level: Contact Guard/Touching assist    Lower Body Dressing/Undressing Lower body dressing      What is the patient wearing?: Underwear/pull up     Lower body assist Assist for lower body dressing: Minimal Assistance - Patient > 75%     Toileting Toileting    Toileting assist Assist for toileting: Contact Guard/Touching assist     Transfers Chair/bed transfer  Transfers assist  Chair/bed transfer activity did not occur: N/A  Chair/bed transfer assist level: Contact Guard/Touching assist     Locomotion Ambulation   Ambulation assist   Ambulation activity did not occur: N/A  Assist level: Contact Guard/Touching assist Assistive device: Walker-rolling Max distance: 22'   Walk 10 feet activity   Assist  Walk 10 feet  activity did  not occur: N/A  Assist level: Contact Guard/Touching assist Assistive device: Walker-rolling   Walk 50 feet activity   Assist Walk 50 feet with 2 turns activity did not occur: N/A         Walk 150 feet activity   Assist Walk 150 feet activity did not occur: N/A         Walk 10 feet on uneven surface  activity   Assist Walk 10 feet on uneven surfaces activity did not occur: N/A         Wheelchair     Assist Will patient use wheelchair at discharge?: Yes Type of Wheelchair: Manual Wheelchair activity did not occur: Safety/medical concerns  Wheelchair assist level: Moderate Assistance - Patient 50 - 74%      Wheelchair 50 feet with 2 turns activity    Assist        Assist Level: Dependent - Patient 0%   Wheelchair 150 feet activity     Assist      Assist Level: Dependent - Patient 0%   Blood pressure 105/61, pulse 80, temperature 98.1 F (36.7 C), resp. rate 18, height 5\' 5"  (1.651 m), weight 105.4 kg, SpO2 94 %.  Medical Problem List and Plan: 1.Left leg weaknesssecondary to large herniated disc at C7-T1/cord compression. Status post anterior cervical discectomy with decompression 05/27/2020. Cervical collar as directed -patient may not shower -ELOS/Goals: 7 to 10 days, supervision to min assist goals  -Continue CIR, DC planned 7/15, needs family ed prior.  2. Antithrombotics: -DVT/anticoagulation:SCDs. Vascular study is positive for indeterminate bilateral peroneal DVT.  -antiplatelet therapy: N/A 3. Pain Management:Neurontin 1200 mg nightly and 1500 mg daily, Flexeril 10 mg 3 times daily as needed, oxycodone and tramadol as needed Some C8 radicular pain as expected    Pain is well controlled 4. Mood:Xanax as needed -antipsychotic agents: N/A 5. Neuropsych: This patientiscapable of making decisions on herown behalf. 6. Skin/Wound Care:Routine skin checks 7.  Fluids/Electrolytes/Nutrition:Routine in and outs with follow-up chemistries 8. Hypertension. Lotensin 20 mg daily, HCTZ 25 mg daily, Coreg 6.25 mg twice daily. Monitor with increased mobility Vitals:   05/31/20 2005 06/01/20 0501  BP:  105/61  Pulse: 64 80  Resp:  18  Temp:  98.1 F (36.7 C)  SpO2:  94%  Currently just on Coreg with soft BP will reduce to 3.125mg  BID 7/6: BP is low. HR is stable. Decrease hydrochlorothiazide to 12.5mg  9. Hypothyroidism. Synthroid 10. Hyperlipidemia. Crestor 11. Macrocytosis without anemia. Follow-up CBC 12. History of alcohol use. Provide counseling 13. Constipation. Laxative assistance    LOS: 3 days A FACE TO FACE EVALUATION WAS PERFORMED  Alleah Dearman P Jaiah Weigel 06/01/2020, 11:24 AM

## 2020-06-01 NOTE — Progress Notes (Signed)
Physical Therapy Session Note  Patient Details  Name: [name] MRN: [id number] Date of Birth: May 20, 1946  Today's Date: 06/01/2020 PT Individual Time: 1300-1400 PT Individual Time Calculation (min): 60 min   Short Term Goals: Week 1:  PT Short Term Goal 1 (Week 1): Pt will be able to perform bed mobility with min assist PT Short Term Goal 2 (Week 1): Pt will be able to perform transfers with CGA PT Short Term Goal 3 (Week 1): Pt will be able to gait x 50' with min assist PT Short Term Goal 4 (Week 1): Pt will be able to perform 2 steps with mod assist  Skilled Therapeutic Interventions/Progress Updates:    Pt received seated in bed finishing lunch, agreeable to PT session. No complaints of pain at rest, does report whole body soreness from previous day's therapy sessions. Pt is CGA for semi-reclined to sitting EOB. Pt is setup A to don hard collar while seated EOB. Stand pivot transfer bed to w/c with RW and CGA. Dependent transport via w/c to/from therapy gym for time conservation. Ambulation x 15 ft RW and CGA. Pt exhibits onset of BUE "shooting" and "electrical" nerve pain down into BUE with mobility due to heavy UE reliance on RW during gait. Pt rates pain as 9/10 and pain subsides with seated rest break. Sitting to semi-reclined on mat table with mod A for BLE management. Semi-reclined BLE strengthening therex with AAROM needed for LLE: SLR, heel slides, hip add squeeze, SAQ x 10 reps each. Pt returned to sitting EOM with min A and increased time. Sit to stand with no AD and min A with HHA needed in standing for balance. Pt fearful of falling and unable to achieve full, upright stance without use of AD. Sit to stand x 2 reps from mat table to RW with CGA. Stand pivot transfer back to w/c then back to bed with RW and CGA. Sit to semi-reclined in bed with min A for LLE management. Pt left semi-reclined in bed with needs in reach, bed alarm in place, husband present. Pt limited in her  ability to participate in functional gait training this session due to nerve pain in BUE.  Therapy Documentation Precautions:  Precautions Precautions: Fall, Cervical Precaution Comments: Verbally reviewed cervical precautions.  Restrictions Weight Bearing Restrictions: No    Therapy/Group: Individual Therapy   Peter Congo, PT, DPT  06/01/2020, 3:22 PM

## 2020-06-01 NOTE — Progress Notes (Signed)
Inpatient Rehabilitation Care Coordinator Assessment and Plan  Patient Details  Name: [name] MRN: [id number] Date of Birth: 1945-12-26  Today's Date: 06/01/2020  Problem List:  Patient Active Problem List   Diagnosis Date Noted  . Spinal cord compression ([place]) 05/29/2020  . HNP (herniated nucleus pulposus) with myelopathy, cervical 05/27/2020  . Alcohol use 05/25/2020  . Pressure injury of skin 05/25/2020  . Radiculopathy 05/25/2020  . Left leg weakness 05/24/2020  . Counseling on health promotion and disease prevention 11/09/2019  . Balance problems 11/06/2019  . Side effect of medication- balance issues with statin 11/06/2019  . Acute renal failure ([place]) 11/06/2019  . Umbilical hernia without obstruction and without gangrene 11/06/2019  . Venous stasis of both lower extremities 01/10/2019  . Weakness generalized 09/09/2018  . Chronic venous stasis dermatitis of both lower extremities 03/11/2018  . B12 deficiency 03/04/2018  . Cellulitis of right leg 12/18/2017  . Peripheral neuropathy due to edema b/l feet 12/03/2017  . Decreased pedal pulses 10/27/2017  . Acute on chronic diastolic heart failure ([place]) 10/25/2017  . Aortic systolic murmur on examination 10/02/2017  . Preoperative cardiovascular examination 10/02/2017  . Exertional dyspnea 09/25/2017  . Bilateral lower extremity edema 11/07/2016  . Primary osteoarthritis of both knees 11/02/2016  . Hypertriglyceridemia 09/29/2016  . HLD (hyperlipidemia) 09/29/2016  . Vitamin D deficiency 09/29/2016  . Adjustment disorder with mixed anxiety and depressed mood 09/29/2016  . Morbid obesity ([place]) 06/18/2016  . R Congenital Kidney ABN: Ess only has one fxn kidney 06/18/2016  . History of hysterectomy 06/18/2016  . H/O gastric bypass 06/18/2016  . Peripheral edema- b/l L Ext w chronic skin changes 06/18/2016  . Insomnia due to alcohol excess and stress 06/18/2016  . GAD (generalized anxiety disorder) 06/14/2016   . h/o Iron deficiency anemia 06/14/2016  . Muscle spasm of back 06/14/2016  . Hypothyroidism 02/13/2016  . Stress at home 02/13/2016  . UGIB (upper gastrointestinal bleed) 04/25/2015  . Hypertension 04/25/2015  . Alcohol abuse-with elevated ALT and AST 04/25/2015  . Acute ulcer at gastrojejunal region- post gastric bypass   . Distal radius fracture, right 01/15/2013   Past Medical History:  Past Medical History:  Diagnosis Date  . Alcohol abuse 04/25/2015  . Allergy   . Anastomotic ulcer S/P gastric bypass 06/18/2016  . Arthritis   . Cataract   . Degenerative disc disease, cervical   . GAD (generalized anxiety disorder) 06/14/2016  . HTN (hypertension) 04/25/2015  . Hypertension   . Hypothyroidism   . 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 Substance abuse ([place])    Alcohol abuse 2016  . UGIB (upper gastrointestinal bleed) 04/25/2015   Past Surgical History:  Past Surgical History:  Procedure Laterality Date  . ANTERIOR CERVICAL DECOMP/DISCECTOMY FUSION Left 05/26/2020   Procedure: Cervical seven - Thoracic one ANTERIOR CERVICAL DISCECTOMY AND PLATING;  Surgeon: Newman Pies, MD;  Location: [facility];  Service: Neurosurgery;  Laterality: Left;  . CHOLECYSTECTOMY    . COSMETIC SURGERY    . DIAGNOSTIC LAPAROSCOPY    . ESOPHAGOGASTRODUODENOSCOPY N/A 04/25/2015   Procedure: ESOPHAGOGASTRODUODENOSCOPY (EGD);  Surgeon: Jerene Bears, MD;  Location[facility] ENDOSCOPY;  Service: Endoscopy;  Laterality: N/A;  . FRACTURE SURGERY    . GASTRIC BYPASS    . NM MYOVIEW LTD  10/10/2017   Normal LV size and function EF 72%.  Medium sized mild severity defect in the apical septal apical lateral and apical wall suggestive  of breast attenuation.  LOW RISK.  No ischemia or infarction.    . OPEN REDUCTION INTERNAL FIXATION (ORIF) DISTAL RADIAL FRACTURE Right 01/15/2013   Procedure: OPEN REDUCTION INTERNAL FIXATION (ORIF) Right DISTAL RADIUS FRACTURE;  Surgeon:  Marybelle Killings, MD;  Location: [facility];  Service: Orthopedics;  Laterality: Right;  . TRANSTHORACIC ECHOCARDIOGRAM  10/04/2017   Mild LVH.  EF 60-65%.  Grade 2/moderate diastolic dysfunction.  Mild pulmonary hypertension.  No valve lesions   Social History:  reports that she has never smoked. She has never used smokeless tobacco. She reports current alcohol use of about 28.0 standard drinks of alcohol per week. She reports that she does not use drugs.  Family / Support Systems Marital Status: Married How Long?: 79 years Spouse/Significant Other: Dominica Severin (husband): [phone] Children: 4 adult children; 1 child lives near [place] and can assist PRN. 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 Education: RN Read: Yes Write: Yes Employment Status: Retired Date Retired/Disabled/Unemployed: 18 Age Retired: 36 Public relations account executive Issues: Denies Guardian/Conservator: N/A   Abuse/Neglect Abuse/Neglect Assessment Can Be Completed: Yes Physical Abuse: Denies Verbal Abuse: Denies Sexual Abuse: Denies Exploitation of patient/patient's resources: Denies Self-Neglect: Denies  Emotional Status Pt's affect, behavior and adjustment status: Pt in good spirits at time of visit Recent Psychosocial Issues: Denies Psychiatric History: Denies Substance Abuse History: Denies; pt admits to 1-2bottles of wine per evening after husband goes to bed.  Patient / Family Perceptions, Expectations & Goals Pt/Family understanding of illness & functional limitations: Pt and husband have a general understanding of care needs Premorbid pt/family  roles/activities: Independent Anticipated changes in roles/activities/participation: Assistance with ADLs/IADLs Pt/family expectations/goals: "strength of being able to walk; c/o of bilateral arm pain through back."  [employer]: None Premorbid Home Care/DME Agencies: None Transportation available at discharge: Husband Resource referrals recommended: Neuropsychology  Discharge Planning Living Arrangements: Spouse/significant other Support Systems: Spouse/significant other, Friends/neighbors Type of Residence: Private residence Administrator, sports: Multimedia programmer (specify) ([employer]; Rx- [employer]) Financial Resources: Social Security, Family Support Financial Screen Referred: No Living Expenses: Own Money Management: Spouse Does the patient have any problems obtaining your medications?: No Home Management: Pt cooks all meals, adn gets assistance from husband; husband does housekeeping Patient/Family Preliminary Plans: no changes. Care Coordinator Barriers to Discharge: Decreased caregiver support, Lack of/limited family support Care Coordinator Anticipated Follow Up Needs: HH/OP  Clinical Impression SW met with pt and pt husband in room to introduce self, explain role, and discuss discharge process. No HCPOA. Not a veteran. DME: 3in1 BSC, tolaltor, transport w/c, 5-6 canes, showerseat and reachers.   Bexlee Bergdoll A Lorah Kalina 06/01/2020, 9:16 AM

## 2020-06-01 NOTE — Progress Notes (Signed)
Patient ID: [name], female   DOB: [birth year], 74 y.o.   MRN: [id number]  SW met with pt and pt husband in room to provide updates from team conference, and inform on d/c date 7/15. SW informed will continue to provide updates pertaining to d/c.   Loralee Pacas, MSW, [place] Office: [phone] Cell: [phone] Fax: [phone]

## 2020-06-01 NOTE — Patient Care Conference (Signed)
Inpatient RehabilitationTeam Conference and Plan of Care Update Date: 06/01/2020   Time: 4:20 PM    Patient Name: [name]      Medical Record Number: [id number]  Date of Birth: 04-21-1946 Sex: Female         Room/Bed: 4M10C/4M10C-01 Payor Info: Payor: HU[employer] MEDICARE / Plan: [employer] MEDICARE CHOICE PPO / Product Type: *No Product type* /    Admit Date/Time:  05/29/2020  3:06 PM  Primary Diagnosis:  HNP (herniated nucleus pulposus) with myelopathy, cervical  Hospital Problems: Principal Problem:   HNP (herniated nucleus pulposus) with myelopathy, cervical Active Problems:   Spinal cord compression [facility])    Expected Discharge Date: Expected Discharge Date: 06/10/20  Team Members Present: Physician leading conference: Dr. Sula Soda Care Coodinator Present: Cecile Sheerer, LCSWA;Sukhraj Esquivias Marlyne Beards, RN, BSN, CRRN Nurse Present: Other (comment) Asa Saunas, RN) PT Present: Peter Congo, PT OT Present: Roney Mans, OT;Ardis Rowan, COTA SLP Present: Feliberto Gottron, SLP PPS Coordinator present : Fae Pippin, SLP     Current Status/Progress Goal Weekly Team Focus  Bowel/Bladder   Pt continent of B/B LBM 05/30/2020  Pt to remain continent of B/B  Assess B/B QS/PRN   Swallow/Nutrition/ Hydration             ADL's   bathing-min A; LB dressing-min A; toileting-min A; functional transfers-min A  supervision/mod I overall  activity tolerance, BUE strengthening, standing balance, functional transfers, educatoin   Mobility   min A bed mobility, min A transfers, CGA gait up to 22' with RW, mod A 3 x 3" stairs 2 handrails  Supervision to mod I  endurance, LLE strengthening, gait training   Communication             Safety/Cognition/ Behavioral Observations            Pain   Pt reports pain a 7/10 before oxycodone PRN  Pain rated at less than 4/10  Assess pain QS/PRN   Skin   Surgical incision to anterior neck clean dry and intact  no new skin breakdown  Assess  skin QS/PRN     Team Discussion:  Discharge Planning/Teaching Needs:  D/c to home with support from husband. PRN support from friends.  Family education as recommended by therapy   Current Update:  Continent of bowel and bladder. Stand Pivot to [facility]. Contact guard to min assist/decreased endurance. Left leg weaker than right. Supervision/mod I goals. Min assist ADL's, lower body dressing. Husband assist PRN.  Current Barriers to Discharge:  Decreased caregiver support, Lack of/limited family support and endurance  Possible Resolutions to Barriers: Educate family on transfer goals, medication assistance, bowel/bladder goals. Give patient rest breaks during therapy sessions and in between therapy sessions.  Patient on target to meet rehab goals: yes  *See Care Plan and progress notes for long and short-term goals.   Revisions to Treatment Plan:  none    Medical Summary Current Status: Pain is well controlled, has bilateral indeterminate peroneal DVTs, spinal incision C/D/I Weekly Focus/Goal: Continue current pain regimen, repeat Doppler LE on 7/9, monitor labs weekly, monitor vitals daily, monitor spinal incision daily  Barriers to Discharge: Weight;Medical stability;Wound care  Barriers to Discharge Comments: Bilateral indeterminate peroneal DVTs, BMI of 38.67, post-op pain, post-op spinal incision Possible Resolutions to Barriers: Continue current pain regimen, repeat Doppler LE on 7/9, monitor labs weekly, monitor vitals daily, monitor spinal incision daily   Continued Need for Acute Rehabilitation Level of Care: The patient requires daily medical management by a physician  with specialized training in physical medicine and rehabilitation for the following reasons: Direction of a multidisciplinary physical rehabilitation program to maximize functional independence : Yes Medical management of patient stability for increased activity during participation in an intensive rehabilitation  regime.: Yes Analysis of laboratory values and/or radiology reports with any subsequent need for medication adjustment and/or medical intervention. : Yes   I attest that I was present, lead the team conference, and concur with the assessment and plan of the team.   Tennis Must 06/01/2020, 4:20 PM

## 2020-06-01 NOTE — Progress Notes (Signed)
Kirsteins, Luanna Salk, MD  Physician  Physical Medicine and Rehabilitation  PMR Pre-admission     Signed  Date of Service:  05/28/2020  9:07 AM      Related encounter: ED to Hosp-Admission (Discharged) from 05/24/2020 in [facility]      Signed       Show:Clear all '[x]'$ Manual'[x]'$ Template'[x]'$ Copied  Added by: '[x]'$ Cristina Gong, RN'[x]'$ Kirsteins, Luanna Salk, MD  '[]'$ Hover for details PMR Admission Coordinator Pre-Admission Assessment   Patient: [name] is an 74 y.o., female MRN: [id number] DOB: 1946/10/29 Height: 5' 5.5" (166.4 cm) Weight: 102.1 kg   Insurance Information HMO:     PPO: yes     PCP:      IPA:      80/20:      OTHER:  PRIMARY: [employer]      Policy#: [id number]      Subscriber: pt CM Name: Manning Charity      Phone#: [phone]     Fax#: [phone] Pre-Cert#: [id number] approved for 7 days with f/u with [facility] phone ext [phone] fax [phone]      Employer:  Benefits:  Phone #: [phone]     Name: 7/2 Eff. Date: 12/28/2018     Deduct: $500      Out of Pocket Max: [phone]      Life Max: none CIR: $325 co pay per day days 1 until 6      SNF: no copay days 1 until 20; $184 co pay per day days 21 until 100 Outpatient: $15 to $40 per visit     Co-Pay: visits limited per medical neccesity Home Health: 1005      Co-Pay: visits per medical neccesity DME: 80%     Co-Pay: 205 Providers: in network  SECONDARY: none      Policy#:      Phone#:    Development worker, community:       Phone#:    The Engineer, [employer]" for patients in Inpatient Rehabilitation Facilities with attached "Privacy Act [place] Records" was provided and verbally reviewed with: Patient   Emergency Contact Information         Contact Information     Name Relation Home Work [name] [phone]   [phone]         Current Medical History  Patient Admitting Diagnosis: cord compression form C7-T1 disc fragment     History of Present Illness:  [name] is a 75 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  Presented 05/24/2020 left leg weakness.  Denies any recent fall.  No bowel or bladder disturbances.  Admission chemistries unremarkable except sodium 133, urinalysis negative nitrite, hemoglobin 12.8.  CT/MRI of the brain negative for acute changes.  MRI of the spine showed multilevel degenerative changes throughout the lumbar spine negative for fracture.  Mild spinal stenosis L3-4 with mild subarticular stenosis bilaterally.  Moderate spinal stenosis L4-5.  Cervical MRI demonstrated a large herniated disc at C7-T1.  Patient underwent C7-T1 anterior cervical discectomy with decompression, interbody arthrodesis with insertion of interbody prosthesis 05/27/2020 per Dr. Arnoldo Morale.  Decadron protocol as indicated and course has been completed.  She was placed in a cervical collar.     Patient's medical record from [facility] has been reviewed by the rehabilitation admission coordinator and physician.   Past Medical History  Past Medical History:  Diagnosis Date  . Alcohol abuse 04/25/2015  . Allergy    . Anastomotic ulcer S/P gastric bypass 06/18/2016  . Arthritis    . Cataract    . Degenerative disc disease, cervical    . GAD (generalized anxiety disorder) 06/14/2016  . HTN (hypertension) 04/25/2015  . Hypertension    . Hypothyroidism    . 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 Substance abuse ([place])      Alcohol abuse 2016  . UGIB (upper gastrointestinal bleed) 04/25/2015      Family History   family history includes Breast cancer in her sister; Breast cancer (age of onset: 37) in her mother; COPD in her father; Cancer in her father and mother; Cancer (age of onset: 58) in her  cousin; Diabetes in her father; Heart disease in her mother; Hypertension in her father and mother; Mental illness in her maternal grandmother; Stroke in her maternal grandmother; Stroke (age of onset: 46) in her mother.   Prior Rehab/Hospitalizations Has the patient had prior rehab or hospitalizations prior to admission? Yes   Has the patient had major surgery during 100 days prior to admission? Yes              Current Medications   Current Facility-Administered Medications:  .  0.9 %  sodium chloride infusion, , Intravenous, PRN, Shela Leff, MD, Last Rate: 10 mL/hr at 05/27/20 0621, 250 mL at 05/27/20 0621 .  acetaminophen (TYLENOL) tablet 650 mg, 650 mg, Oral, Q4H PRN, 650 mg at 05/28/20 1328 **OR** acetaminophen (TYLENOL) suppository 650 mg, 650 mg, Rectal, Q4H PRN, Newman Pies, MD .  ALPRAZolam Duanne Moron) tablet 0.5 mg, 0.5 mg, Oral, PRN, Donne Hazel, MD, 0.5 mg at 05/26/20 1721 .  alum & mag hydroxide-simeth (MAALOX/MYLANTA) 200-200-20 MG/5ML suspension 30 mL, 30 mL, Oral, Q6H PRN, Newman Pies, MD .  benazepril (LOTENSIN) tablet 20 mg, 20 mg, Oral, Daily, 20 mg at 05/28/20 1019 **AND** hydrochlorothiazide (HYDRODIURIL) tablet 25 mg, 25 mg, Oral, Daily, Shela Leff, MD, 25 mg at 05/28/20 1019 .  bisacodyl (DULCOLAX) suppository 10 mg, 10 mg, Rectal, Daily PRN, Newman Pies, MD .  carvedilol (COREG) tablet 6.25 mg, 6.25 mg, Oral, BID WC, Newman Pies, MD, 6.25 mg at 05/28/20 0850 .  cyclobenzaprine (FLEXERIL) tablet 10 mg, 10 mg, Oral, TID PRN, Newman Pies, MD, 10 mg at 05/28/20 0228 .  docusate sodium (COLACE) capsule 100 mg, 100 mg, Oral, BID, Newman Pies, MD, 100 mg at 05/28/20 1103 .  ferrous sulfate tablet 325 mg, 325 mg, Oral, Q breakfast, Shela Leff, MD, 325 mg at 05/28/20 0850 .  folic acid (FOLVITE) tablet 1 mg, 1 mg, Oral, Daily, Shela Leff, MD, 1 mg at 05/27/20 0859 .  furosemide (LASIX) tablet 20 mg, 20 mg, Oral, Daily,  Donne Hazel, MD, 20 mg at 05/28/20 1328 .  gabapentin (NEURONTIN) capsule 1,200 mg, 1,200 mg, Oral, QHS, Shela Leff, MD, 1,200 mg at 05/27/20 2120 .  gabapentin (NEURONTIN) capsule 1,500 mg, 1,500 mg, Oral, Daily, Shela Leff, MD, 1,500 mg at 05/28/20 1019 .  lactated ringers infusion, , Intravenous, Continuous, Newman Pies, MD, Stopped at 05/28/20 [phone]  levothyroxine (SYNTHROID) tablet 125 mcg, 125 mcg, Oral, Once per day on Tue Fri, Rathore, Wandra Feinstein, MD, 125 mcg at 05/28/20 0650 .  levothyroxine (SYNTHROID) tablet 150 mcg, 150 mcg, Oral, Once per day on Sun Mon Wed Thu Sat, Shela Leff, MD, 150  mcg at 05/27/20 0619 .  menthol-cetylpyridinium (CEPACOL) lozenge 3 mg, 1 lozenge, Oral, PRN **OR** [DISCONTINUED] phenol (CHLORASEPTIC) mouth spray 1 spray, 1 spray, Mouth/Throat, PRN, Newman Pies, MD .  morphine 4 MG/ML injection 4 mg, 4 mg, Intravenous, Q2H PRN, Newman Pies, MD, 4 mg at 05/27/20 0531 .  ondansetron (ZOFRAN) tablet 4 mg, 4 mg, Oral, Q6H PRN **OR** ondansetron (ZOFRAN) injection 4 mg, 4 mg, Intravenous, Q6H PRN, Newman Pies, MD .  oxyCODONE (Oxy IR/ROXICODONE) immediate release tablet 10 mg, 10 mg, Oral, Q3H PRN, Newman Pies, MD, 10 mg at 05/28/20 1327 .  oxyCODONE (Oxy IR/ROXICODONE) immediate release tablet 5 mg, 5 mg, Oral, Q3H PRN, Newman Pies, MD, 5 mg at 05/27/20 1829 .  [START ON 05/29/2020] pantoprazole (PROTONIX) EC tablet 40 mg, 40 mg, Oral, Daily, Joselyn Glassman A, RPH .  rosuvastatin (CRESTOR) tablet 5 mg, 5 mg, Oral, Daily, Shela Leff, MD, 5 mg at 05/28/20 1019 .  thiamine tablet 100 mg, 100 mg, Oral, Daily, 100 mg at 05/27/20 0857 **OR** [DISCONTINUED] thiamine (B-1) injection 100 mg, 100 mg, Intravenous, Daily, Shela Leff, MD .  traMADol Veatrice Bourbon) tablet 50 mg, 50 mg, Oral, Daily PRN, Donne Hazel, MD, 50 mg at 05/28/20 0902 .  zolpidem (AMBIEN) tablet 5 mg, 5 mg, Oral, QHS PRN, Newman Pies, MD     Patients Current Diet:     Diet Order                      Diet Heart Room service appropriate? Yes; Fluid consistency: Thin  Diet effective now                      Precautions / Restrictions Precautions Precautions: Fall, Cervical Precaution Booklet Issued: No Precaution Comments: Verbally reviewed cervical precautions.  Restrictions Weight Bearing Restrictions: No    Has the patient had 2 or more falls or a fall with injury in the past year? Yes   Prior Activity Level Limited Community (1-2x/wk): Mod I with cane in the community. No AD in home   Prior Functional Level Self Care: Did the patient need help bathing, dressing, using the toilet or eating? Independent   Indoor Mobility: Did the patient need assistance with walking from room to room (with or without device)? Independent   Stairs: Did the patient need assistance with internal or external stairs (with or without device)? Needed some help   Functional Cognition: Did the patient need help planning regular tasks such as shopping or remembering to take medications? [place] / [place] Devices/Equipment: Cane (specify quad or straight) Home Equipment: Walker - 2 wheels, Cane - single point, Bedside commode   Prior Device Use: Indicate devices/aids used by the patient prior to current illness, exacerbation or injury? cane   Current Functional Level Cognition   Overall Cognitive Status: Within Functional Limits for tasks assessed Orientation Level: Oriented X4    Extremity Assessment (includes Sensation/Coordination)   Upper Extremity Assessment: Generalized weakness (but overall functional; no sensory issues/resolved per pt) LUE Deficits / Details: pt reports a sudden sharp pain that she can only get relief from by shoulder flexion, abdution with hand behind head pushing intoward on her head and head on her hand. pt reports "this is starting to not give me the relief  now" pt reports sometimes on R but always on L. This occurred only after static standing LUE Coordination: WNL  Lower Extremity Assessment: Defer to PT  evaluation RLE Deficits / Details: noted to have redness below knee and edema present. pt reports that redness is not new but the swelling is. pt reports sensation change is new LLE Deficits / Details: redness to skin below knee with edema present. pt reports decr sensation. pt reports that L feels different than R. pt states "your glove feels smooth on my right leg but it feels rough to touch on my left left" Noted functional weakness as increased L knee buckling when standing.  LLE Sensation: decreased light touch     ADLs   Overall ADL's : Needs assistance/impaired Eating/Feeding: Modified independent, Bed level Grooming: Sitting, Minimal assistance Grooming Details (indicate cue type and reason): to reach hair Upper Body Bathing: Set up, Sitting Lower Body Bathing: Moderate assistance, Sit to/from stand Upper Body Dressing : Minimal assistance, Sitting Upper Body Dressing Details (indicate cue type and reason): A to donn/doff cervical collar Lower Body Dressing: Maximal assistance, Sit to/from stand Toilet Transfer: Moderate assistance, +2 for physical assistance, Stand-pivot, BSC, RW Toilet Transfer Details (indicate cue type and reason): ablet o take several pivotal steps to [facility] Toileting- Clothing Manipulation and Hygiene: Total assistance, Sit to/from stand Toileting - Clothing Manipulation Details (indicate cue type and reason): unable to release RW for pericare Functional mobility during ADLs: Rolling walker, Cueing for sequencing, Cueing for safety, +2 for physical assistance, Moderate assistance General ADL Comments: Pt normally bends forward tfor LB ADL; will need to use AE; unablet o complete figure four positioning     Mobility   Overal bed mobility: Needs Assistance Bed Mobility: Rolling, Sidelying to Sit Rolling: Min  assist Sidelying to sit: Mod assist Supine to sit: Min assist Sit to supine: +2 for physical assistance, Max assist General bed mobility comments: Min A for assist with rolling and mod A for trunk elevation.      Transfers   Overall transfer level: Needs assistance Equipment used: Rolling walker (2 wheeled) Transfers: Sit to/from Stand, [employer] Transfers Sit to Stand: Mod assist, +2 physical assistance, From elevated surface Stand pivot transfers: Min assist, +2 physical assistance General transfer comment: VC for correct hand placement and positioning in RW due to LLE weakness/hx of buckling     Ambulation / Gait / Stairs / Wheelchair Mobility   Ambulation/Gait Ambulation/Gait assistance: Mod assist, Min assist, +2 physical assistance Gait Distance (Feet): 2 Feet Assistive device: Rolling walker (2 wheeled) Gait Pattern/deviations: Step-to pattern, Decreased step length - right, Decreased step length - left, Decreased weight shift to left General Gait Details: Took steps from [facility] to chair using RW. Cues for appropriate use of RW. Noted instability of L knee. Required min to mod A for steadying.  Gait velocity: Decreased     Posture / Balance Balance Overall balance assessment: Needs assistance Sitting-balance support: No upper extremity supported Sitting balance-Leahy Scale: Fair Standing balance support: Bilateral upper extremity supported Standing balance-Leahy Scale: Poor Standing balance comment: reliant on BUE support and external support     Special needs/care consideration Designate visitors are [name], Dominica Severin and daughter, Barbera Setters    Previous Home Environment  Living Arrangements: [name]/significant other  Lives With: [name] Available Help at Discharge: Family, Available 24 hours/day Type of Home: House Home Layout: Two level, 1/2 bath on main level, Bed/bath upstairs Home Access: Stairs to enter Entrance Stairs-Rails: Right Entrance Stairs-Number of Steps:  2 Bathroom Shower/Tub: Multimedia programmer: Standard Bathroom Accessibility: Yes How Accessible: Accessible via walker [place]: No Additional Comments: reports that she has  been sleeping on the main level due to increased difficulty with the stairs. the couch can turn into a bed    Discharge Living Setting Plans for Discharge Living Setting: Patient's home, Lives with (comment) ([name]) Type of Home at Discharge: House Discharge Home Layout: Two level, 1/2 bath on main level, Bed/bath upstairs (has been sleeping on couch on main level) Discharge Home Access: Stairs to enter Entrance Stairs-Rails: Right Entrance Stairs-Number of Steps: 2 Discharge Bathroom Shower/Tub: Walk-in shower Discharge Bathroom Toilet: Standard Discharge Bathroom Accessibility: Yes How Accessible: Accessible via walker Does the patient have any problems obtaining your medications?: No   Social/Family/Support Systems Patient Roles: [name] Contact Information: [name], Dominica Severin Anticipated Caregiver: [name] Anticipated Ambulance person Information: see above Ability/Limitations of Caregiver: none Caregiver Availability: 24/7 Discharge Plan Discussed with Primary Caregiver: Yes Is Caregiver In Agreement with Plan?: Yes Does Caregiver/Family have Issues with Lodging/Transportation while Pt is in Rehab?: No   Goals Patient/Family Goal for Rehab: Mod I to supervision with PT and OT Expected length of stay: ELOS 7 to 10 days Pt/Family Agrees to Admission and willing to participate: Yes Program Orientation Provided & Reviewed with Pt/Caregiver Including Roles  & Responsibilities: Yes   Decrease burden of Care through IP rehab admission: n/a   Possible need for SNF placement upon discharge: not anticipated   Patient Condition: I have reviewed medical records from [facility] , spoken with CM, and patient. I met with patient at the bedside for inpatient rehabilitation assessment.   Patient will benefit from ongoing PT and OT, can actively participate in 3 hours of therapy a day 5 days of the week, and can make measurable gains during the admission.  Patient will also benefit from the coordinated team approach during an Inpatient Acute Rehabilitation admission.  The patient will receive intensive therapy as well as Rehabilitation physician, nursing, social worker, and care management interventions.  Due to bladder management, bowel management, safety, skin/wound care, disease management, medication administration, pain management and patient education the patient requires 24 hour a day rehabilitation nursing.  The patient is currently mod assist overall with mobility and basic ADLs.  Discharge setting and therapy post discharge at home with home health is anticipated.  Patient has agreed to participate in the Acute Inpatient Rehabilitation Program and will admit tomorrow 05/29/2020 when bed is available.   Preadmission Screen Completed By:  Cleatrice Burke, 05/28/2020 3:26 PM ______________________________________________________________________   Discussed status with Dr. Letta Pate on 05/28/2020 at 1343 and received approval for admission 05/29/2020 when bed is available.   Admission Coordinator:  Cleatrice Burke, RN, time  6599 Date 05/28/2020    Assessment/Plan: Diagnosis:Cervical myelopathy 1. Does the need for close, 24 hr/day Medical supervision in concert with the patient's rehab needs make it unreasonable for this patient to be served in a less intensive setting? Yes 2. Co-Morbidities requiring supervision/potential complications: Bilateral hip osteoarthritis, d CHF, HTN, Morbid obesity  3. Due to bladder management, bowel management, safety, skin/wound care, disease management, medication administration, pain management and patient education, does the patient require 24 hr/day rehab nursing? Yes 4. Does the patient require coordinated care of a physician, rehab  nurse, PT, OT, and SLP to address physical and functional deficits in the context of the above medical diagnosis(es)? Yes Addressing deficits in the following areas: balance, endurance, locomotion, strength, transferring, bowel/bladder control, bathing, dressing, feeding, grooming, toileting and psychosocial support 5. Can the patient actively participate in an intensive therapy program of at least 3 hrs of therapy  5 days a week? Yes 6. The potential for patient to make measurable gains while on inpatient rehab is good 7. Anticipated functional outcomes upon discharge from inpatient rehab: supervision and min assist PT, supervision and min assist OT, n/a SLP 8. Estimated rehab length of stay to reach the above functional goals is: 7-10dd 9. Anticipated discharge destination: Home 10. Overall Rehab/Functional Prognosis: good     MD Signature: Charlett Blake M.D. [facility] FAAPM&R (Neuromuscular Med) Diplomate Am Board of Electrodiagnostic Med Fellow Am Board of Interventional Pain            Revision History                               Note Details  Author Charlett Blake, MD File Time 05/29/2020  2:32 PM  Author Type Physician Status Signed  Last Editor Charlett Blake, MD Service Physical Medicine and [place] # [id number] Admit Date 05/29/2020

## 2020-06-02 ENCOUNTER — Inpatient Hospital Stay (HOSPITAL_COMMUNITY): Payer: Medicare PPO | Admitting: Physical Therapy

## 2020-06-02 ENCOUNTER — Inpatient Hospital Stay (HOSPITAL_COMMUNITY): Payer: Medicare PPO

## 2020-06-02 MED ORDER — MELOXICAM 7.5 MG PO TABS
15.0000 mg | ORAL_TABLET | Freq: Every day | ORAL | Status: DC | PRN
  Administered 2020-06-03: 15 mg via ORAL
  Filled 2020-06-02 (×2): qty 2

## 2020-06-02 MED ORDER — BENAZEPRIL HCL 20 MG PO TABS
20.0000 mg | ORAL_TABLET | Freq: Every day | ORAL | Status: DC
  Administered 2020-06-03 – 2020-06-10 (×8): 20 mg via ORAL
  Filled 2020-06-02 (×8): qty 1

## 2020-06-02 MED ORDER — HYDROCHLOROTHIAZIDE 10 MG/ML ORAL SUSPENSION
6.2500 mg | Freq: Every day | ORAL | Status: DC
  Filled 2020-06-02: qty 1.25

## 2020-06-02 NOTE — Progress Notes (Signed)
Physical Therapy Session Note  Patient Details  Name: [name] MRN: [id number] Date of Birth: 1946/03/01  Today's Date: 06/02/2020 PT Individual Time: 1115-1200 PT Individual Time Calculation (min): 45 min   Short Term Goals: Week 1:  PT Short Term Goal 1 (Week 1): Pt will be able to perform bed mobility with min assist PT Short Term Goal 2 (Week 1): Pt will be able to perform transfers with CGA PT Short Term Goal 3 (Week 1): Pt will be able to gait x 50' with min assist PT Short Term Goal 4 (Week 1): Pt will be able to perform 2 steps with mod assist  Skilled Therapeutic Interventions/Progress Updates:  Pt received seated in recliner in room, agreeable to PT session. No complaints of pain at rest, does exhibit intermittent BUE and LUE nerve pain with mobility and limb use throughout session that subsides again at rest. Sit to stand with min A to RW throughout session. Ambulation into bathroom with RW and CGA. Pt is able to independently manage her clothing with toileting while standing with RW and is setup A for pericare. Ambulation 2 x 30 ft with RW and CGA in hallway. Pt exhibits increased tolerance for gait training this date. Pt does continue to exhibit L hip flexor weakness with come circumduction of LLE during gait noted. Standing alt L/R hip abd x 5 reps with RW and CGA for balance. Pt exhibits heavy reliance on BUE on RW in SLS as well as decreased tolerance for SLS on LLE. Deferred further performance of this exercise due to pain. Sidesteps L/R 2 x 10 ft each direction with RW and CGA for balance for hip abductor strengthening. Pt requests to return to bed at end of session. Stand pivot transfer w/c to bed with CGA. Sit to supine with min A for LLE management. Pt left semi-reclined in bed with needs in reach, bed alarm in place at end of session.  Therapy Documentation Precautions:  Precautions Precautions: Fall, Cervical Precaution Comments: Verbally reviewed cervical  precautions.  Restrictions Weight Bearing Restrictions: No   Therapy/Group: Individual Therapy   Peter Congo, PT, DPT  06/02/2020, 12:57 PM

## 2020-06-02 NOTE — Progress Notes (Signed)
Physical Therapy Session Note  Patient Details  Name: [name] MRN: [id number] Date of Birth: 11-29-1945  Today's Date: 06/02/2020 PT Individual Time: 1500-1530 PT Individual Time Calculation (min): 30 min   Short Term Goals: Week 1:  PT Short Term Goal 1 (Week 1): Pt will be able to perform bed mobility with min assist PT Short Term Goal 2 (Week 1): Pt will be able to perform transfers with CGA PT Short Term Goal 3 (Week 1): Pt will be able to gait x 50' with min assist PT Short Term Goal 4 (Week 1): Pt will be able to perform 2 steps with mod assist  Skilled Therapeutic Interventions/Progress Updates:  Pt received supine in bed in room with [facility] in place, husband at bedside. Agreeable to therapy. Denies any pain at rest prior to start of therapy session. Pt does exhibit intermittent BUE nerve pain with limb use throughout session that subsides again at rest. Session focus on improving functional bed mobility. Performed rolling R/L x3 reps each side with initially min A then close supervision. Pt uses a gait belt with a loop on the end to help manage LLE. Verbal cues required for hand placement on bed rail. Performed supine <> sit x2 reps with close supervision. Pt again uses gait belt to help manage LLE. While seated at EOB, pt is able to scoot laterally along EOB with close supervision. Performed supine heel slides in bed 1 x 8 each side with AAROM needed for LLE. Pt left semi-reclined in bed with all needs in reach. All questions answered.  Therapy Documentation Precautions:  Precautions Precautions: Fall, Cervical Precaution Comments: Verbally reviewed cervical precautions.  Restrictions Weight Bearing Restrictions: No  Therapy/Group: Individual Therapy  Murrell Redden, SPT 06/02/2020, 4:49 PM

## 2020-06-02 NOTE — Progress Notes (Signed)
Physical Therapy Session Note  Patient Details  Name: [name] MRN: [id number] Date of Birth: 1946/02/12  Today's Date: 06/02/2020 PT Individual Time:1646-1725 PT Individual Time Calculation (min): 39 min   Short Term Goals: Week 1:  PT Short Term Goal 1 (Week 1): Pt will be able to perform bed mobility with min assist PT Short Term Goal 2 (Week 1): Pt will be able to perform transfers with CGA PT Short Term Goal 3 (Week 1): Pt will be able to gait x 50' with min assist PT Short Term Goal 4 (Week 1): Pt will be able to perform 2 steps with mod assist  Skilled Therapeutic Interventions/Progress Updates:   Pt received sitting in EOB and agreeable to PT. Pt performed sit<>stand and stand pivot transfer to [facility] with min assist throughout session.   gait training with RW x 52f, 164fand 2029fith min assist. Cues for posture, step length, and safety in turns. Noted to walk with toe-heel progression of foot contact in BLE, which pt states is baseline gait pattern. Following each bout of gait training, pt requires prolonged rest break due to LE weakness of fatigue. Patient returned to room and [facility] and all needs met.      Therapy Documentation Precautions:  Precautions Precautions: Fall, Cervical Precaution Comments: Verbally reviewed cervical precautions.  Restrictions Weight Bearing Restrictions: No Pain: Pain Assessment Pain Scale: 0-10 Pain Score: 6  Pain Type: Chronic pain Pain Location: Back Pain Orientation: Lower Pain Radiating Towards: Legs Pain Descriptors / Indicators: Aching Pain Frequency: Intermittent Pain Onset: On-going Patients Stated Pain Goal: 0 Pain Intervention(s): Medication (See eMAR) Multiple Pain Sites: No   Therapy/Group: Individual Therapy  AusLorie Phenix7/2021, 5:35 PM

## 2020-06-02 NOTE — Progress Notes (Signed)
[place] PHYSICAL MEDICINE & REHABILITATION PROGRESS NOTE   Subjective/Complaints: No complaints this morning. LLE pain is well controlled. She is aware of DC date of 7/15- feels this is earlier than expected but she will be happy if she meets this goal.  ROS- denies CP , SOB, N/V/D   Objective:   No results found. Recent Labs    05/31/20 0752  WBC 6.5  HGB 12.3  HCT 38.8  PLT 219   Recent Labs    05/31/20 0752  NA 135  K 4.3  CL 97*  CO2 29  GLUCOSE 155*  BUN 25*  CREATININE 0.91  CALCIUM 9.0    Intake/Output Summary (Last 24 hours) at 06/02/2020 1254 Last data filed at 06/02/2020 0800 Gross per 24 hour  Intake 520 ml  Output --  Net 520 ml     Physical Exam: Vital Signs Blood pressure 113/69, pulse 80, temperature 97.9 F (36.6 C), resp. rate 18, height 5\' 5"  (1.651 m), weight 105.4 kg, SpO2 97 %. General: Alert and oriented x 3, No apparent distress HEENT: Head is normocephalic, atraumatic, PERRLA, EOMI, sclera anicteric, oral mucosa pink and moist, dentition intact, ext ear canals clear,  Neck: Supple without JVD or lymphadenopathy Heart: Reg rate and rhythm. No murmurs rubs or gallops Chest: CTA bilaterally without wheezes, rales, or rhonchi; no distress Abdomen: Soft, non-tender, non-distended, bowel sounds positive. Extremities: No clubbing, cyanosis, or edema. Pulses are 2+ Skin: Spinal Incision C/D/I Neurologic: Cranial nerves II through XII intact, motor strength is 4/5 in bilateral deltoid, bicep, tricep, grip, HI hip flexor, knee extensors, ankle dorsiflexor and plantar flexor  Musculoskeletal: Full range of motion in all 4 extremities. No joint swelling  Assessment/Plan: 1. Functional deficits secondary to C7-T1 cord compression with myelopathy , s/p decompression  which require 3+ hours per day of interdisciplinary therapy in a comprehensive inpatient rehab setting.  Physiatrist is providing close team supervision and 24 hour management of  active medical problems listed below.  Physiatrist and rehab team continue to assess barriers to discharge/monitor patient progress toward functional and medical goals  Care Tool:  Bathing    Body parts bathed by patient: Right arm, Left arm, Chest, Abdomen, Front perineal area, Right upper leg, Left upper leg, Face   Body parts bathed by helper: Buttocks, Left lower leg, Right lower leg     Bathing assist Assist Level: Minimal Assistance - Patient > 75%     Upper Body Dressing/Undressing Upper body dressing   What is the patient wearing?: Pull over shirt    Upper body assist Assist Level: Contact Guard/Touching assist    Lower Body Dressing/Undressing Lower body dressing      What is the patient wearing?: Underwear/pull up, Pants     Lower body assist Assist for lower body dressing: Minimal Assistance - Patient > 75%     Toileting Toileting    Toileting assist Assist for toileting: Supervision/Verbal cueing     Transfers Chair/bed transfer  Transfers assist  Chair/bed transfer activity did not occur: N/A  Chair/bed transfer assist level: Contact Guard/Touching assist     Locomotion Ambulation   Ambulation assist   Ambulation activity did not occur: N/A  Assist level: Contact Guard/Touching assist Assistive device: Walker-rolling Max distance: 15'   Walk 10 feet activity   Assist  Walk 10 feet activity did not occur: N/A  Assist level: Contact Guard/Touching assist Assistive device: Walker-rolling   Walk 50 feet activity   Assist Walk 50 feet with 2 turns  activity did not occur: N/A         Walk 150 feet activity   Assist Walk 150 feet activity did not occur: N/A         Walk 10 feet on uneven surface  activity   Assist Walk 10 feet on uneven surfaces activity did not occur: N/A         Wheelchair     Assist Will patient use wheelchair at discharge?: Yes Type of Wheelchair: Manual Wheelchair activity did not occur:  Safety/medical concerns  Wheelchair assist level: Moderate Assistance - Patient 50 - 74%      Wheelchair 50 feet with 2 turns activity    Assist        Assist Level: Dependent - Patient 0%   Wheelchair 150 feet activity     Assist      Assist Level: Dependent - Patient 0%   Blood pressure 113/69, pulse 80, temperature 97.9 F (36.6 C), resp. rate 18, height 5\' 5"  (1.651 m), weight 105.4 kg, SpO2 97 %.  Medical Problem List and Plan: 1.Left leg weaknesssecondary to large herniated disc at C7-T1/cord compression. Status post anterior cervical discectomy with decompression 05/27/2020. Cervical collar as directed -patient may not shower -ELOS/Goals: 7 to 10 days, supervision to min assist goals  -Continue CIR, DC planned 7/15, needs family ed prior.  2. Antithrombotics: -DVT/anticoagulation:SCDs. Vascular study is positive for indeterminate bilateral peroneal DVT. Repeat on 7/9 (ordered) -antiplatelet therapy: N/A 3. Pain Management:Neurontin 1200 mg nightly and 1500 mg daily, Flexeril 10 mg 3 times daily as needed, oxycodone and tramadol as needed Some C8 radicular pain as expected    7/7 ordered home Meloxicam 15mg  daily PRN 4. Mood:Xanax as needed -antipsychotic agents: N/A 5. Neuropsych: This patientiscapable of making decisions on herown behalf. 6. Skin/Wound Care:Routine skin checks 7. Fluids/Electrolytes/Nutrition:Routine in and outs with follow-up chemistries 8. Hypertension. Lotensin 20 mg daily, HCTZ 25 mg daily, Coreg 6.25 mg twice daily. Monitor with increased mobility Vitals:   06/02/20 0453 06/02/20 0900  BP: 111/63 113/69  Pulse: 75 80  Resp: 18   Temp: 97.9 F (36.6 C)   SpO2: 97%   Currently just on Coreg with soft BP will reduce to 3.125mg  BID 7/6: BP is low. HR is stable. Decrease hydrochlorothiazide to 12.5mg  7/7: BP is low. Decrease HCTZ to 6.25mg .  9. Hypothyroidism.  Synthroid 10. Hyperlipidemia. Crestor 11. Macrocytosis without anemia. Follow-up CBC 12. History of alcohol use. Provide counseling 13. Constipation. Laxative assistance   LOS: 4 days A FACE TO FACE EVALUATION WAS PERFORMED  08/03/20 Jaena Brocato 06/02/2020, 12:54 PM

## 2020-06-02 NOTE — Progress Notes (Signed)
Occupational Therapy Session Note  Patient Details  Name: [name] MRN: [id number] Date of Birth: 10/21/1946  Today's Date: 06/02/2020 OT Individual Time: 0910-0940 OT Individual Time Calculation (min): 30 min   Session 2: OT Individual Time:1400-1439 OT Individual Time Calculation (min): 39 min    Short Term Goals: Week 1:  OT Short Term Goal 1 (Week 1): Pt will perform 3/3 toileting tasks with CGA OT Short Term Goal 2 (Week 1): Pt will perform LB dressing with AE as needed Min A OT Short Term Goal 3 (Week 1): Pt will perform various ADL transfers with CGA  Skilled Therapeutic Interventions/Progress Updates:    Pt received supine with RN administering morning medications. 10 min missed for pt to take medication. Pt assisted in donning B compression socks from home, mod A overall, pt able to pull up once over feet while supine. Pt transitioned to EOB with (S). Pt stood from EOB with CGA using RW and transferred to w/c. Pt completed oral care and grooming tasks at the sink with set up assist. Pt requested to use the bathroom and completed ~10 ft of functional mobility in the room with CGA using RW. Pt able to complete standing level clothing management before transferring to standard toilet. Pt requested to be given several minutes and stated she would call for assistance for hygiene and to get up. NT alerted to pt on commode.   Session 2: Pt received supine with husband present, requesting to complete ADLs. Adjusted fit of pt's aspen collar to ensure proper support on cervical spine, but pt c/o it being too tight. Pt transferred to EOB with (S). Pt able to transfer sit > stand with CGA from EOB. Rw used to ambulate to sink in room with close CGA. Pt sat to complete bathing at the sink. Set up assist for UB bathing and dressing. Pt required min guard for LB dressing and was able to reach feet without reacher. Pt was provided with new socks that were cut at the top to reduce tightness  on edematous legs. Pt transferred back to bed and her aspen collar was adjusted again. Pt was provided edu/cueing on log rolling technique to reduce strain through cervical spine. Pt was left supine with all needs met, bed alarm set.    Therapy Documentation Precautions:  Precautions Precautions: Fall, Cervical Precaution Comments: Verbally reviewed cervical precautions.  Restrictions Weight Bearing Restrictions: No  Therapy/Group: Individual Therapy  Sandra H Davis 06/02/2020, 7:23 AM  

## 2020-06-03 ENCOUNTER — Inpatient Hospital Stay (HOSPITAL_COMMUNITY): Payer: Medicare PPO

## 2020-06-03 ENCOUNTER — Inpatient Hospital Stay (HOSPITAL_COMMUNITY): Payer: Medicare PPO | Admitting: Physical Therapy

## 2020-06-03 DIAGNOSIS — I82409 Acute embolism and thrombosis of unspecified deep veins of unspecified lower extremity: Secondary | ICD-10-CM

## 2020-06-03 MED ORDER — MELOXICAM 7.5 MG PO TABS
15.0000 mg | ORAL_TABLET | Freq: Every day | ORAL | Status: DC
  Administered 2020-06-04 – 2020-06-10 (×7): 15 mg via ORAL
  Filled 2020-06-03 (×8): qty 2

## 2020-06-03 MED ORDER — PREDNISONE 10 MG PO TABS
10.0000 mg | ORAL_TABLET | Freq: Three times a day (TID) | ORAL | Status: DC
  Administered 2020-06-03 – 2020-06-09 (×19): 10 mg via ORAL
  Filled 2020-06-03 (×19): qty 1

## 2020-06-03 MED ORDER — RIVAROXABAN 15 MG PO TABS
15.0000 mg | ORAL_TABLET | ORAL | Status: AC
  Administered 2020-06-03: 15 mg via ORAL

## 2020-06-03 MED ORDER — RIVAROXABAN 15 MG PO TABS
15.0000 mg | ORAL_TABLET | Freq: Two times a day (BID) | ORAL | Status: DC
  Administered 2020-06-03 – 2020-06-10 (×14): 15 mg via ORAL
  Filled 2020-06-03 (×14): qty 1

## 2020-06-03 MED ORDER — RIVAROXABAN 20 MG PO TABS: 20.0000 mg | ORAL_TABLET | Freq: Every day | ORAL | Status: DC

## 2020-06-03 NOTE — Progress Notes (Signed)
Physical Therapy Note  Patient Details  Name: [name] MRN: [id number] Date of Birth: May 25, 1946 Today's Date: 06/03/2020    Attempted to see patient for scheduled therapy session. Per pt and MD the pt is on hold from therapy this date due to LE DVT. Will follow up per POC. Pt missed 60 min of scheduled skilled therapy session.    Peter Congo, PT, DPT  06/03/2020, 2:21 PM

## 2020-06-03 NOTE — Progress Notes (Signed)
Patient ID: [name], female   DOB: [birth year], 74 y.o.   MRN: [id number]  Sw provided pt with HHA list. SW to follow-up about preference.  Cecile Sheerer, MSW, LCSWA Office: [phone] Cell: [phone] Fax: [phone]

## 2020-06-03 NOTE — Progress Notes (Signed)
ANTICOAGULATION CONSULT NOTE - Initial Consult  Pharmacy Consult for Xarelto Indication: bilateral LE DVT  Allergies  Allergen Reactions  . Cocoa Anaphylaxis  . Red Dye Hives    Rash and Nausea diarrhea  . Lyrica [Pregabalin] Other (See Comments)    Abnormal bleeding  . Other Other (See Comments)    Berries : Rash  . Sulfa Antibiotics Rash    Patient Measurements: Height: 5\' 5"  (165.1 cm) Weight: 105.4 kg (232 lb 5.8 oz) IBW/kg (Calculated) : 57   Vital Signs: Temp: 98.6 F (37 C) (07/08 0431) BP: 144/67 (07/08 1442) Pulse Rate: 78 (07/08 1442)  Labs: [name], HCT, PLT, [name], [name], [name], [name], [name], CREATININE, [name], [name], [name]:  Diagnosis Date  . Alcohol abuse 04/25/2015  . Allergy   . Anastomotic ulcer S/P gastric bypass 06/18/2016  . Arthritis   . Cataract   . Degenerative disc disease, cervical   . GAD (generalized anxiety disorder) 06/14/2016  . HTN (hypertension) 04/25/2015  . Hypertension   . Hypothyroidism   . 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 Substance abuse (HCC)    Alcohol abuse 2016  . UGIB (upper gastrointestinal bleed) 04/25/2015    Medications:  Medications Prior to Admission  Medication Sig Dispense Refill Last Dose  . acetaminophen (TYLENOL) 500 MG tablet Take 500 mg by mouth every 6 (six) hours as needed for moderate pain. Take 1 to 2 tablets daily     . benazepril-hydrochlorthiazide (LOTENSIN HCT) 20-25 MG tablet TAKE 1 TABLET BY MOUTH DAILY. 90 tablet 1   . carvedilol (COREG) 6.25 MG tablet TAKE 1 TABLET BY MOUTH 2 TIMES DAILY WITH A MEAL. (Patient taking differently: Take 6.25 mg by mouth 2 (two) times daily with a meal. ) 180 tablet 3   . Cholecalciferol (VITAMIN D3) 125 MCG (5000 UT) TABS  Take 15,000 Units by mouth See admin instructions. Takes 04/27/2015 units 5 days a week and 20000 units on 2 days.     . Cyanocobalamin (VITAMIN B 12 PO) Take 2,500 mg by mouth once a week.     . cyclobenzaprine (FLEXERIL) 10 MG tablet TAKE 1 TABLET BY MOUTH 3 TIMES DAILY AS NEEDED FOR MUSCLE SPASMS. (Patient taking differently: Take 10 mg by mouth daily as needed for muscle spasms. 61683) 30 tablet 0   . diphenhydrAMINE (BENADRYL) 25 MG tablet Take 25 mg by mouth at bedtime as needed for sleep.      Marland Kitchen docusate sodium 100 MG CAPS Take 100 mg by mouth 2 (two) times daily. 10 capsule 0   . ferrous sulfate 325 (65 FE) MG tablet Take 325 mg by mouth daily with breakfast.     . furosemide (LASIX) 40 MG tablet Take 1 tablet (40 mg total) by mouth daily. (Patient taking differently: Take 20-40 mg by mouth See admin instructions. Takes 40mg  twice a week and 20 mg on other days , depends on weight and water intake.) 90 tablet 1   . gabapentin (NEURONTIN) 300 MG capsule Take 300 mg by mouth See admin instructions. Take 5 capsules in the morning and 4 capsules at bedtime.      . gabapentin (NEURONTIN) 300 MG capsule Take 4 capsules by mouth every morning, 3 capsules in the afternoon, and 4 capsules at night (Patient  not taking: Reported on 05/24/2020) 990 capsule 0   . levothyroxine (SYNTHROID) 125 MCG tablet Only take 1 tab Tues and Fridays (Patient taking differently: Take 125 mcg by mouth See admin instructions. Take 1 tablet by mouth on Tuesday and Fridays) 25 tablet 0   . levothyroxine (SYNTHROID) 150 MCG tablet everyday except Tues and Friday- take those days (Patient taking differently: Take 150 mcg by mouth See admin instructions. Takes everyday except Tues and Friday) 60 tablet 0   . Melatonin 5 MG CAPS Take 10 mg by mouth at bedtime.      . meloxicam (MOBIC) 15 MG tablet TAKE 1 TABLET BY MOUTH DAILY. (Patient taking differently: Take 15 mg by mouth daily. ) 90 tablet 1   . oxyCODONE (OXY IR/ROXICODONE) 5 MG  immediate release tablet Take 1 tablet (5 mg total) by mouth every 3 (three) hours as needed for moderate pain ((score 4 to 6)). 30 tablet 0   . oxyCODONE 10 MG TABS Take 1 tablet (10 mg total) by mouth every 3 (three) hours as needed for severe pain ((score 7 to 10)). 30 tablet 0   . pantoprazole (PROTONIX) 40 MG tablet Take 1 tablet (40 mg total) by mouth daily. 90 tablet 0   . rosuvastatin (CRESTOR) 5 MG tablet Take 1 tablet (5 mg total) by mouth daily. 90 tablet 1   . traMADol (ULTRAM) 50 MG tablet TAKE 1 TABLET BY MOUTH 2 TIMES DAILY. MAXIMUM 6 TABLETS PER DAY AS DIRECTED BY PRESCRIBER (Patient taking differently: Take 50 mg by mouth daily as needed for moderate pain. ) 60 tablet 0   . zolpidem (AMBIEN) 5 MG tablet Take 1 tablet (5 mg total) by mouth at bedtime as needed for sleep. 30 tablet 0      Assessment:  74 y.o female with indeterminate bilateral peroneal LE DVTs per venous duplex 06/03/20.  Pharmacy consulted to start Xarelto treatment dose.  Not currenly on anitcoagulation and not on anitcoagulation PTA.  She was admitted to [facility] hospital on  6/28 with left lower extremity weakness/numbness/neck pain radiating to the left upper extremity secondary to cord compression from C7-T1 disc fragment:  >  discharged to CIR admit 05/29/20.  CBC wnl on 05/29/20.  PMH:  significant for hypertension, hyperlipidemia, hypothyroidism, chronic diastolic CHF, venostasis, alcohol use disorder and reported GI bleed for which EGD was said to have revealed bleeding ulcer that was "clipped.    Goal of Therapy:  Monitor platelets by anticoagulation protocol: Yes   Plan:  Xarelto 15 mg BID with food x 21 days then 20 mg daily   (instructed RN to give 1st dose now 1530 , 2nd dose ~MN, give each dose with snack). Monitor for s/sx of bleeding TOC consult for benefits check for Xarelto Rx   Thank you for allowing pharmacy to be part of this patients care team. [name], RPh Clinical Pharmacist  Please check  AMION for all [facility] phone numbers After 10:00 PM, call Main Pharmacy [phone]  06/03/2020,3:24 PM

## 2020-06-03 NOTE — Progress Notes (Signed)
[place] PHYSICAL MEDICINE & REHABILITATION PROGRESS NOTE   Subjective/Complaints: Complains of worsening right hand neuropathic pain and numbness that is making it difficult for her to use the walker. Discussed steroid taper to help decreased cervical spine inflammation and potentially help with nerve decompression and she is agreeable. Discussed side effects of steroids.   ROS- denies CP , SOB, N/V/D   Objective:   No results found. No results for input(s): WBC, HGB, HCT, PLT in the last 72 hours. No results for input(s): NA, K, CL, CO2, GLUCOSE, BUN, CREATININE, CALCIUM in the last 72 hours.  Intake/Output Summary (Last 24 hours) at 06/03/2020 1125 Last data filed at 06/03/2020 0800 Gross per 24 hour  Intake 960 ml  Output --  Net 960 ml     Physical Exam: Vital Signs Blood pressure (!) 119/55, pulse 71, temperature 98.6 F (37 C), resp. rate 18, height 5\' 5"  (1.651 m), weight 105.4 kg, SpO2 99 %. General: Alert and oriented x 3, No apparent distress HEENT: Head is normocephalic, atraumatic, PERRLA, EOMI, sclera anicteric, oral mucosa pink and moist, dentition intact, ext ear canals clear,  Neck: Supple without JVD or lymphadenopathy Heart: Reg rate and rhythm. No murmurs rubs or gallops Chest: CTA bilaterally without wheezes, rales, or rhonchi; no distress Abdomen: Soft, non-tender, non-distended, bowel sounds positive. Extremities: No clubbing, cyanosis, or edema. Pulses are 2+ Skin: Spinal Incision C/D/I Neurologic: Cranial nerves II through XII intact, motor strength is 4/5 in bilateral deltoid, bicep, tricep, grip, HI hip flexor, knee extensors, ankle dorsiflexor and plantar flexor  Musculoskeletal: Full range of motion in all 4 extremities. No joint swelling   Assessment/Plan: 1. Functional deficits secondary to C7-T1 cord compression with myelopathy , s/p decompression  which require 3+ hours per day of interdisciplinary therapy in a comprehensive inpatient rehab  setting.  Physiatrist is providing close team supervision and 24 hour management of active medical problems listed below.  Physiatrist and rehab team continue to assess barriers to discharge/monitor patient progress toward functional and medical goals  Care Tool:  Bathing    Body parts bathed by patient: Right arm, Left arm, Chest, Abdomen, Front perineal area, Right upper leg, Left upper leg, Face   Body parts bathed by helper: Buttocks, Left lower leg, Right lower leg     Bathing assist Assist Level: Minimal Assistance - Patient > 75%     Upper Body Dressing/Undressing Upper body dressing   What is the patient wearing?: Pull over shirt    Upper body assist Assist Level: Contact Guard/Touching assist    Lower Body Dressing/Undressing Lower body dressing      What is the patient wearing?: Underwear/pull up, Pants     Lower body assist Assist for lower body dressing: Minimal Assistance - Patient > 75%     Toileting Toileting    Toileting assist Assist for toileting: Supervision/Verbal cueing     Transfers Chair/bed transfer  Transfers assist  Chair/bed transfer activity did not occur: N/A  Chair/bed transfer assist level: Contact Guard/Touching assist     Locomotion Ambulation   Ambulation assist   Ambulation activity did not occur: N/A  Assist level: Contact Guard/Touching assist Assistive device: Walker-rolling Max distance: 30'   Walk 10 feet activity   Assist  Walk 10 feet activity did not occur: N/A  Assist level: Contact Guard/Touching assist Assistive device: Walker-rolling   Walk 50 feet activity   Assist Walk 50 feet with 2 turns activity did not occur: N/A  Walk 150 feet activity   Assist Walk 150 feet activity did not occur: N/A         Walk 10 feet on uneven surface  activity   Assist Walk 10 feet on uneven surfaces activity did not occur: N/A         Wheelchair     Assist Will patient use  wheelchair at discharge?: Yes Type of Wheelchair: Manual Wheelchair activity did not occur: Safety/medical concerns  Wheelchair assist level: Moderate Assistance - Patient 50 - 74%      Wheelchair 50 feet with 2 turns activity    Assist        Assist Level: Dependent - Patient 0%   Wheelchair 150 feet activity     Assist      Assist Level: Dependent - Patient 0%   Blood pressure (!) 119/55, pulse 71, temperature 98.6 F (37 C), resp. rate 18, height 5\' 5"  (1.651 m), weight 105.4 kg, SpO2 99 %.  Medical Problem List and Plan: 1.Left leg weaknesssecondary to large herniated disc at C7-T1/cord compression. Status post anterior cervical discectomy with decompression 05/27/2020. Cervical collar as directed -patient may not shower -ELOS/Goals: 7 to 10 days, supervision to min assist goals  -Continue CIR, DC planned 7/15, needs family ed prior.  2. Antithrombotics: -DVT/anticoagulation:SCDs. Vascular study is positive for indeterminate bilateral peroneal DVT. Repeat on 7/9 (ordered) -antiplatelet therapy: N/A 3. Pain Management:Neurontin 1200 mg nightly and 1500 mg daily, Flexeril 10 mg 3 times daily as needed, oxycodone and tramadol as needed Some C8 radicular pain as expected    7/7 ordered home Meloxicam 15mg  daily PRN  7/8: scheduled Meloxicam as per patient's request. This is how she takes it at home. Added Prednisone 10mg  TID for patient's right arm neuropathic pain to help decreased nerve root compression.  4. Mood:Xanax as needed -antipsychotic agents: N/A 5. Neuropsych: This patientiscapable of making decisions on herown behalf. 6. Skin/Wound Care:Routine skin checks 7. Fluids/Electrolytes/Nutrition:Routine in and outs with follow-up chemistries 8. Hypertension. Lotensin 20 mg daily, HCTZ 25 mg daily, Coreg 6.25 mg twice daily. Monitor with increased mobility Vitals:   06/03/20 0431 06/03/20 0930   BP: (!) 116/54 (!) 119/55  Pulse: 73 71  Resp: 18   Temp: 98.6 F (37 C)   SpO2: 99%   Currently just on Coreg with soft BP will reduce to 3.125mg  BID 7/6: BP is low. HR is stable. Decrease hydrochlorothiazide to 12.5mg  7/7: BP is low. Decrease HCTZ to 6.25mg .  7/8: BP remians soft. Pulse is stable. Stop HCTZ. 9. Hypothyroidism. Synthroid 10. Hyperlipidemia. Crestor 11. Macrocytosis without anemia. Follow-up CBC 12. History of alcohol use. Provide counseling 13. Constipation. Laxative assistance   LOS: 5 days A FACE TO FACE EVALUATION WAS PERFORMED  Devaunte Gasparini P Aiyanah Kalama 06/03/2020, 11:25 AM

## 2020-06-03 NOTE — Progress Notes (Signed)
Physical Therapy Session Note  Patient Details  Name: [name] MRN: [id number] Date of Birth: 1946-05-13  Today's Date: 06/03/2020     Short Term Goals: Week 1:  PT Short Term Goal 1 (Week 1): Pt will be able to perform bed mobility with min assist PT Short Term Goal 2 (Week 1): Pt will be able to perform transfers with CGA PT Short Term Goal 3 (Week 1): Pt will be able to gait x 50' with min assist PT Short Term Goal 4 (Week 1): Pt will be able to perform 2 steps with mod assist  Skilled Therapeutic Interventions/Progress Updates:  Pt on MD hold due to new discovery of DVT. Will re-attempt to PT at later time/date, provided pt is medically stable.      Therapy Documentation Precautions:  Precautions Precautions: Fall, Cervical Precaution Comments: Verbally reviewed cervical precautions.  Restrictions Weight Bearing Restrictions: No General: PT Amount of Missed Time (min): 60 Minutes PT Missed Treatment Reason: MD hold (Comment) (on hold due to LE DVT) Vital Signs: Therapy Vitals Temp: 98.4 F (36.9 C) Pulse Rate: 78 Resp: 18 BP: (!) 144/67 Patient Position (if appropriate): Sitting Oxygen Therapy SpO2: 97 % O2 Device: Room Air   Therapy/Group: Individual Therapy  Golden Pop 06/03/2020, 5:38 PM

## 2020-06-03 NOTE — Progress Notes (Signed)
Occupational Therapy Session Note  Patient Details  Name: [name] MRN: [id number] Date of Birth: 06-03-1946  Today's Date: 06/03/2020 OT Individual Time: 1000-1100 OT Individual Time Calculation (min): 60 min    Short Term Goals: Week 1:  OT Short Term Goal 1 (Week 1): Pt will perform 3/3 toileting tasks with CGA OT Short Term Goal 2 (Week 1): Pt will perform LB dressing with AE as needed Min A OT Short Term Goal 3 (Week 1): Pt will perform various ADL transfers with CGA  Skilled Therapeutic Interventions/Progress Updates:    Pt resting in bed upon arrival.  OT intervention with focus on bed mobility, standing balance, functional transfers, functional amb with RW, BADL training, activity tolerance, and safety awareness to increase independence with BADLs. Supine>sit EOB with supervision.  Pt amb with RW to bathroom (CGA) and transferred to toilet.  Pt completed toileting tasks with CGA. Pt completed bathing/dressing tasks with CGA for standing balance to pull pants over hips.  Pt requires assistance with lower LE. Pt uses reacher to assist with threading pants over BLE. Pt stood at sink to brush hair.  Pt transitioned to ADL apartment for discussion about shower transfers.  Pt has walk in shower with approx 4" edge to step over.  Shower has curtain.  Pt practiced lifting LLE to step over edge but unable to at this time.  Offered option of TTB but will continue to work on stepping over edge of shower.  Pt stated they will get whatever they need. Pt returned to room and transferred to recliner.  Pt remained in recliner with all needs within reach and seat alarm activated.   Therapy Documentation Precautions:  Precautions Precautions: Fall, Cervical Precaution Comments: Verbally reviewed cervical precautions.  Restrictions Weight Bearing Restrictions: No Pain:  Pt c/o 8/10 pain in BUE; meds admin prior to therapy and pt acknowledge at end of session that pain "is  resolving."   Therapy/Group: Individual Therapy  Rich Brave 06/03/2020, 12:27 PM

## 2020-06-03 NOTE — Progress Notes (Signed)
Bilateral lower extremity venous duplex has been completed. Preliminary results can be found in CV Proc through chart review.  Results were given to the patient's nurse, Taryn.  06/03/20 1:00 PM Olen Cordial RVT

## 2020-06-04 ENCOUNTER — Inpatient Hospital Stay (HOSPITAL_COMMUNITY): Payer: Medicare PPO

## 2020-06-04 ENCOUNTER — Inpatient Hospital Stay (HOSPITAL_COMMUNITY): Payer: Medicare PPO | Admitting: Physical Therapy

## 2020-06-04 ENCOUNTER — Inpatient Hospital Stay (HOSPITAL_COMMUNITY): Payer: Medicare PPO | Admitting: Occupational Therapy

## 2020-06-04 MED ORDER — GUAIFENESIN 200 MG PO TABS
200.0000 mg | ORAL_TABLET | ORAL | Status: DC | PRN
  Administered 2020-06-04 – 2020-06-09 (×7): 200 mg via ORAL
  Filled 2020-06-04 (×10): qty 1

## 2020-06-04 MED ORDER — DICLOFENAC SODIUM 1 % EX GEL
2.0000 g | Freq: Four times a day (QID) | CUTANEOUS | Status: DC
  Administered 2020-06-04 (×3): 2 g via TOPICAL
  Filled 2020-06-04: qty 100

## 2020-06-04 NOTE — Progress Notes (Signed)
Physical Therapy Session Note  Patient Details  Name: [name] MRN: [id number] Date of Birth: Mar 01, 1946  Today's Date: 06/04/2020 PT Individual Time: 1320-1400 PT Individual Time Calculation (min): 40 min  PT Missed Time: 20 minutes Missed Time Reason: Just received lunch on therapist arrival to room  Short Term Goals: Week 1:  PT Short Term Goal 1 (Week 1): Pt will be able to perform bed mobility with min assist PT Short Term Goal 2 (Week 1): Pt will be able to perform transfers with CGA PT Short Term Goal 3 (Week 1): Pt will be able to gait x 50' with min assist PT Short Term Goal 4 (Week 1): Pt will be able to perform 2 steps with mod assist  Skilled Therapeutic Interventions/Progress Updates:  Pt received seated in bed finishing lunch. Agreeable to therapy. Pt c/o mild B/L shoulder pain at rest prior to start of therapy session. Pain addressed with positioning and frequent rest breaks throughout session. HCC donned with pt seated at EOB. Pt performs sit <> stand with RW and CGA throughout session. Ambulation to/from bathroom with RW and CGA. Pt is able to independently manage clothing and pericare with toileting. Seated in W/C, compression hose applied to BLEs by therapist. Dependent W/C transfer from room to therapy gym for time conservation. Ambulated x58 feet with RW and CGA. Pt demonstrates L hip flexor weakness with circumduction of LLE and difficulty clearing L foot. Pt states she wears sandals with socks (not tennis shoes) at home, and is agreeable to ambulating with those on at a later session. Ascended/descended 8 x 3 inch stairs using B/L rails with min A and a step-to pattern. Pt again demonstrates L hip flexor weakness and difficulty clearing the L foot. Standing in the parallel bars, performed sidestepping R/L 2 x 10 feet each direction holding onto one rail with both hands and CGA for balance/hip abductor strengthening. Dependent W/C transfer from therapy gym to room  for time conservation. Stand pivot transfer from W/C to EOB with RW and CGA. Sit to supine in bed with min A for LLE management. Pt left semi-reclined in bed with all needs in reach. All questions answered. Pt missed 20 minutes of skilled therapy because she just received lunch on therapist arrival to room.  Therapy Documentation Precautions:  Precautions Precautions: Fall, Cervical Precaution Comments: Verbally reviewed cervical precautions.  Restrictions Weight Bearing Restrictions: No  Therapy/Group: Individual Therapy  Murrell Redden, SPT 06/04/2020, 4:00 PM

## 2020-06-04 NOTE — Progress Notes (Signed)
Physical Therapy Session Note  Patient Details  Name: [name] MRN: [id number] Date of Birth: 06-Jun-1946  Today's Date: 06/04/2020 PT Individual Time: 1000-1100 PT Individual Time Calculation (min): 60 min   Short Term Goals: Week 1:  PT Short Term Goal 1 (Week 1): Pt will be able to perform bed mobility with min assist PT Short Term Goal 2 (Week 1): Pt will be able to perform transfers with CGA PT Short Term Goal 3 (Week 1): Pt will be able to gait x 50' with min assist PT Short Term Goal 4 (Week 1): Pt will be able to perform 2 steps with mod assist Week 2:    Week 3:     Skilled Therapeutic Interventions/Progress Updates:    PAIN 4/10 cervical, rest breaks as needed, treatment to tolerance  Pt initially oob in recliner, c/o cervical pain.  States she declined pain meds but would now like to receive.  Nursing notified.  Pt STS from recliner w/min assist.  Gait 62ft w/RW and cga to min assist, increased lateral trunk excursions.   Balance in parallel bars: Static stand without UE support w/cga up to 20sec, mild gradually increasing R lean Static stand eyes closed up to 10 count, increased R tendency semitandem stance L/R cga up to 10 count,  R/L w/sig increased sway/challenge, maintains up to 5 sec/recovers L loss of balance using UEs Repeated x 3 Standing feet almost together up to 15 count, L tendency, cga, recovers w/hands on bars Standing feet together, increased challenge w/eyes closed, up to 8count, increased sway and L tendency Standing feet together w/armswing and cga x 10 w/cga to min assist Standing on foam - static stand /challenging/sig increased sway, cga to min assist up to 3-4 min, cues for posture.  Pt educated re purpose of balance training activities/goal to decrease fall risk.  Pt transported back to hall outside room.  STS and gait x 68ft w/RW and cga, increased trunk excursions, decreased stance time L, decreased step length R vs L turn/sit to  recliner w/cga.  Pt left oob in recliner w/chair alarm set and needs in reach.  Therapy Documentation Precautions:  Precautions Precautions: Fall, Cervical Precaution Comments: Verbally reviewed cervical precautions.  Restrictions Weight Bearing Restrictions: No    Therapy/Group: Individual Therapy  Rada Hay, PT   [name] 06/04/2020, 12:37 PM

## 2020-06-04 NOTE — Progress Notes (Signed)
Occupational Therapy Session Note  Patient Details  Name: [place] Oborn MRN: [id number] Date of Birth: Jun 17, 1946  Today's Date: 06/04/2020 OT Individual Time: 1535-1630 OT Individual Time Calculation (min): 55 min    Short Term Goals: Week 1:  OT Short Term Goal 1 (Week 1): Pt will perform 3/3 toileting tasks with CGA OT Short Term Goal 2 (Week 1): Pt will perform LB dressing with AE as needed Min A OT Short Term Goal 3 (Week 1): Pt will perform various ADL transfers with CGA  Skilled Therapeutic Interventions/Progress Updates:    "My shoulders are hurting a lot".  6/10 on FACEs scale right posterior shoulder, between scapulas, and mild pain left posterior shoulder.  Pt sitting EOB at beginning of session agreeable to OT.  Pt completed sit to sidelying and log roll to supine with supervision.  OT provided soft tissue massage and trigger point release extensively and to pts tolerance on right and left levator scapulae.  Noted multiple trigger areas and able to achieve some release with simultaneous MHP application to increase soft tissue pliability.  Skin assessed prior and after application; intact.  Provision of gentle AAROM to bilateral shoulder FF and abd to 90 degrees x 10 each to facilitate humeroscapular rhythm and normal soft tissue mobility.  Pt reports significant decrease in pain to 2/10 on FACEs scale after therapeutic measures.  Remainder of OT session focused on kitchen safety education due to pt reports she wants to get back to cooking as soon as possible.  Pt reports eagerness to learn and good understanding of education provided.  Call bell in reach, bed alarm on.  Therapy Documentation Precautions:  Precautions Precautions: Fall, Cervical Precaution Comments: Verbally reviewed cervical precautions.  Restrictions Weight Bearing Restrictions: No   Therapy/Group: Individual Therapy  Amie Critchley 06/04/2020, 6:10 PM

## 2020-06-04 NOTE — Progress Notes (Signed)
[facility] PHYSICAL MEDICINE & REHABILITATION PROGRESS NOTE   Subjective/Complaints: Complains of shoulder pain bilaterally this morning. Agreeable to trying diclofenac gel.  Has not noted any benefits from steroids yet. No side effects either.   ROS- denies CP , SOB, N/V/D   Objective:   VAS [place] LOWER EXTREMITY VENOUS (DVT)  Result Date: 06/03/2020  Lower Venous DVTStudy Indications: History of DVT.  Risk Factors: DVT. Limitations: Body habitus and poor ultrasound/tissue interface. Comparison Study: 05/30/2020 - R PeroV L PeroV Performing Technologist: Chanda Busing RVT  Examination Guidelines: A complete evaluation includes B-mode imaging, spectral Doppler, color Doppler, and power Doppler as needed of all accessible portions of each vessel. Bilateral testing is considered an integral part of a complete examination. Limited examinations for reoccurring indications may be performed as noted. The reflux portion of the exam is performed with the patient in reverse Trendelenburg.  +---------+---------------+---------+-----------+----------+-----------------+ RIGHT    CompressibilityPhasicitySpontaneityPropertiesThrombus Aging    +---------+---------------+---------+-----------+----------+-----------------+ CFV      Full           Yes      Yes                                    +---------+---------------+---------+-----------+----------+-----------------+ SFJ      Full                                                           +---------+---------------+---------+-----------+----------+-----------------+ FV Prox  Full                                                           +---------+---------------+---------+-----------+----------+-----------------+ FV Mid   Full                                                           +---------+---------------+---------+-----------+----------+-----------------+ FV DistalFull                                                            +---------+---------------+---------+-----------+----------+-----------------+ PFV      Full                                                           +---------+---------------+---------+-----------+----------+-----------------+ POP      Full           Yes      Yes                                    +---------+---------------+---------+-----------+----------+-----------------+  PTV      Full                                                           +---------+---------------+---------+-----------+----------+-----------------+ PERO     Partial                                      Age Indeterminate +---------+---------------+---------+-----------+----------+-----------------+   +---------+---------------+---------+-----------+----------+-----------------+ LEFT     CompressibilityPhasicitySpontaneityPropertiesThrombus Aging    +---------+---------------+---------+-----------+----------+-----------------+ CFV      Full           Yes      Yes                                    +---------+---------------+---------+-----------+----------+-----------------+ SFJ      Full                                                           +---------+---------------+---------+-----------+----------+-----------------+ FV Prox  Full                                                           +---------+---------------+---------+-----------+----------+-----------------+ FV Mid   Full                                                           +---------+---------------+---------+-----------+----------+-----------------+ FV DistalFull                                                           +---------+---------------+---------+-----------+----------+-----------------+ PFV      Full                                                           +---------+---------------+---------+-----------+----------+-----------------+ POP      Partial        Yes      Yes                   Age Indeterminate +---------+---------------+---------+-----------+----------+-----------------+ PTV      Full                                                           +---------+---------------+---------+-----------+----------+-----------------+  PERO     Partial                                      Age Indeterminate +---------+---------------+---------+-----------+----------+-----------------+     Summary: RIGHT: - Findings consistent with age indeterminate deep vein thrombosis involving the right peroneal veins. - No cystic structure found in the popliteal fossa.  LEFT: - Findings consistent with age indeterminate deep vein thrombosis involving the left popliteal vein, and left peroneal veins. - No cystic structure found in the popliteal fossa.  *See table(s) above for measurements and observations. Electronically signed by [name] LivingsBrandon Cain MD on 06/03/2020 at 8:17:57 PM.    Final    No results for input(s): WBC, HGB, HCT, PLT in the last 72 hours. No results for input(s): NA, K, CL, CO2, GLUCOSE, BUN, CREATININE, CALCIUM in the last 72 hours.  Intake/Output Summary (Last 24 hours) at 06/04/2020 1122 Last data filed at 06/04/2020 0748 Gross per 24 hour  Intake 756 ml  Output --  Net 756 ml     Physical Exam: Vital Signs Blood pressure 124/63, pulse 84, temperature 98.6 F (37 C), resp. rate 17, height 5\' 5"  (1.651 m), weight 105.4 kg, SpO2 95 %. General: Alert and oriented x 3, No apparent distress HEENT: Head is normocephalic, atraumatic, PERRLA, EOMI, sclera anicteric, oral mucosa pink and moist, dentition intact, ext ear canals clear,  Neck: Supple without JVD or lymphadenopathy Heart: Reg rate and rhythm. No murmurs rubs or gallops Chest: CTA bilaterally without wheezes, rales, or rhonchi; no distress Abdomen: Soft, non-tender, non-distended, bowel sounds positive. Extremities: No clubbing, cyanosis, or edema. Pulses are 2+ Skin: anterior neck incision  C/D/I Neurologic: Cranial nerves II through XII intact, motor strength is 4/5 in bilateral deltoid, bicep, tricep, grip, HI hip flexor, knee extensors, ankle dorsiflexor and plantar flexor  Musculoskeletal: Full range of motion in all 4 extremities. No joint swelling  Assessment/Plan: 1. Functional deficits secondary to C7-T1 cord compression with myelopathy , s/p decompression  which require 3+ hours per day of interdisciplinary therapy in a comprehensive inpatient rehab setting.  Physiatrist is providing close team supervision and 24 hour management of active medical problems listed below.  Physiatrist and rehab team continue to assess barriers to discharge/monitor patient progress toward functional and medical goals  Care Tool:  Bathing    Body parts bathed by patient: Right arm, Left arm, Chest, Abdomen, Front perineal area, Right upper leg, Left upper leg, Face, Buttocks   Body parts bathed by helper: Right lower leg, Left lower leg     Bathing assist Assist Level: Minimal Assistance - Patient > 75%     Upper Body Dressing/Undressing Upper body dressing   What is the patient wearing?: Pull over shirt    Upper body assist Assist Level: Set up assist    Lower Body Dressing/Undressing Lower body dressing      What is the patient wearing?: Underwear/pull up, Pants     Lower body assist Assist for lower body dressing: Contact Guard/Touching assist     Toileting Toileting    Toileting assist Assist for toileting: Supervision/Verbal cueing     Transfers Chair/bed transfer  Transfers assist  Chair/bed transfer activity did not occur: N/A  Chair/bed transfer assist level: Contact Guard/Touching assist     Locomotion Ambulation   Ambulation assist   Ambulation activity did not occur: N/A  Assist level: Contact Guard/Touching assist Assistive device: Walker-rolling  Max distance: 30'   Walk 10 feet activity   Assist  Walk 10 feet activity did not occur:  N/A  Assist level: Contact Guard/Touching assist Assistive device: Walker-rolling   Walk 50 feet activity   Assist Walk 50 feet with 2 turns activity did not occur: N/A         Walk 150 feet activity   Assist Walk 150 feet activity did not occur: N/A         Walk 10 feet on uneven surface  activity   Assist Walk 10 feet on uneven surfaces activity did not occur: N/A         Wheelchair     Assist Will patient use wheelchair at discharge?: Yes Type of Wheelchair: Manual Wheelchair activity did not occur: Safety/medical concerns  Wheelchair assist level: Moderate Assistance - Patient 50 - 74%      Wheelchair 50 feet with 2 turns activity    Assist        Assist Level: Dependent - Patient 0%   Wheelchair 150 feet activity     Assist      Assist Level: Dependent - Patient 0%   Blood pressure 124/63, pulse 84, temperature 98.6 F (37 C), resp. rate 17, height 5\' 5"  (1.651 m), weight 105.4 kg, SpO2 95 %.  Medical Problem List and Plan: 1.Left leg weaknesssecondary to large herniated disc at C7-T1/cord compression. Status post anterior cervical discectomy with decompression 05/27/2020. Cervical collar as directed -patient may not shower -ELOS/Goals: 7 to 10 days, supervision to min assist goals  -Continue CIR, DC planned 7/15, needs family ed prior.  2. Antithrombotics: -DVT/anticoagulation:Xarelto. Vascular study is positive for indeterminate bilateral peroneal DVT.  7/9: Repeat shows new popliteal DVT and stable prior. Xarelto started. May resume therapy today.  -antiplatelet therapy: N/A 3. Pain Management:Neurontin 1200 mg nightly and 1500 mg daily, Flexeril 10 mg 3 times daily as needed, oxycodone and tramadol as needed Some C8 radicular pain as expected    7/7 ordered home Meloxicam 15mg  daily PRN  7/8: scheduled Meloxicam as per patient's request. This is how she takes it at home. Added  Prednisone 10mg  TID for patient's right arm neuropathic pain to help decreased nerve root compression.   7/9: bilateral shoulder pain present, possibly from being in bed most of yesterday as therapy was held due to new DVT. Added diclofenac gel. 4. Mood:Xanax as needed -antipsychotic agents: N/A 5. Neuropsych: This patientiscapable of making decisions on herown behalf. 6. Skin/Wound Care:Routine skin checks 7. Fluids/Electrolytes/Nutrition:Routine in and outs with follow-up chemistries 8. Hypertension. Lotensin 20 mg daily, HCTZ 25 mg daily, Coreg 6.25 mg twice daily. Monitor with increased mobility Vitals:   06/04/20 0519 06/04/20 0928  BP: (!) 147/76 124/63  Pulse: 80 84  Resp: 17   Temp: 98.6 F (37 C)   SpO2: 97% 95%  Currently just on Coreg with soft BP will reduce to 3.125mg  BID 7/6: BP is low. HR is stable. Decrease hydrochlorothiazide to 12.5mg  7/7: BP is low. Decrease HCTZ to 6.25mg .  7/8: BP remians soft. Pulse is stable. Stop HCTZ. 7/9: BP labile. Continue current regimen.  9. Hypothyroidism. Synthroid 10. Hyperlipidemia. Crestor 11. Macrocytosis without anemia. Follow-up CBC 12. History of alcohol use. Provide counseling 13. Constipation. Laxative assistance   LOS: 6 days A FACE TO FACE EVALUATION WAS PERFORMED  [name] 06/04/2020, 11:22 AM

## 2020-06-05 ENCOUNTER — Inpatient Hospital Stay (HOSPITAL_COMMUNITY): Payer: Medicare PPO | Admitting: Physical Therapy

## 2020-06-05 ENCOUNTER — Inpatient Hospital Stay (HOSPITAL_COMMUNITY): Payer: Medicare PPO | Admitting: Occupational Therapy

## 2020-06-05 DIAGNOSIS — G9520 Unspecified cord compression: Secondary | ICD-10-CM

## 2020-06-05 DIAGNOSIS — G8918 Other acute postprocedural pain: Secondary | ICD-10-CM

## 2020-06-05 DIAGNOSIS — E8809 Other disorders of plasma-protein metabolism, not elsewhere classified: Secondary | ICD-10-CM

## 2020-06-05 DIAGNOSIS — E46 Unspecified protein-calorie malnutrition: Secondary | ICD-10-CM

## 2020-06-05 DIAGNOSIS — D7589 Other specified diseases of blood and blood-forming organs: Secondary | ICD-10-CM

## 2020-06-05 DIAGNOSIS — I10 Essential (primary) hypertension: Secondary | ICD-10-CM

## 2020-06-05 MED ORDER — BENEPROTEIN PO POWD: 1.0000 | Freq: Three times a day (TID) | ORAL | Status: DC

## 2020-06-05 MED ORDER — BENEPROTEIN PO POWD
1.0000 | Freq: Three times a day (TID) | ORAL | Status: DC
  Filled 2020-06-05: qty 227

## 2020-06-05 MED ORDER — CARVEDILOL 6.25 MG PO TABS
6.2500 mg | ORAL_TABLET | Freq: Two times a day (BID) | ORAL | Status: DC
  Administered 2020-06-05 – 2020-06-09 (×9): 6.25 mg via ORAL
  Filled 2020-06-05 (×9): qty 1

## 2020-06-05 NOTE — Progress Notes (Signed)
Occupational Therapy Session Note  Patient Details  Name: [name] MRN: [id number] Date of Birth: 08-26-1946  Today's Date: 06/05/2020 OT Individual Time: 3354-5625 OT Individual Time Calculation (min): 104 min    Short Term Goals: Week 1:  OT Short Term Goal 1 (Week 1): Pt will perform 3/3 toileting tasks with CGA OT Short Term Goal 2 (Week 1): Pt will perform LB dressing with AE as needed Min A OT Short Term Goal 3 (Week 1): Pt will perform various ADL transfers with CGA  Skilled Therapeutic Interventions/Progress Updates: focus as follows; Lower body dressing edge of bed with lateral leans= close and leaving part of her leg on bed Toilet transfer via regular toilet, grab bars, and rolling walker = close S toileting simulated= Min Assist and extra time for balance Dynamical standing balance = min a, utilizing one hand at a time Functional transfers for self care and functional mobility = close S and extra time  Dynamic balance to access refrigerator = close S and cues for positioning of walker with rerigerator due open/closed and balance to pour water from small light weight pitcher = close S   Patient was assisted walker to bed transfer via wheelchair and walker with close S  Bed mobility = extra time and close S  She was left with bed alarm, call bell and phone in place.  cointinue OT POC     Therapy Documentation Precautions:  Precautions Precautions: Fall, Cervical Precaution Comments: Verbally reviewed cervical precautions.  Restrictions Weight Bearing Restrictions: No General:   Pain:denied     Therapy/Group: Individual Therapy  Bud Face [facility] 06/05/2020, 4:14 PM

## 2020-06-05 NOTE — Progress Notes (Signed)
[facility] PHYSICAL MEDICINE & REHABILITATION PROGRESS NOTE   Subjective/Complaints: Patient seen sitting up in bed this morning.  She states she slept well overnight.  She notes improvement in pain.  She has questions regarding blood pressure medication.  ROS: Denies CP , SOB, N/V/D   Objective:   VAS [place] LOWER EXTREMITY VENOUS (DVT)  Result Date: 06/03/2020  Lower Venous DVTStudy Indications: History of DVT.  Risk Factors: DVT. Limitations: Body habitus and poor ultrasound/tissue interface. Comparison Study: 05/30/2020 - R PeroV L PeroV Performing Technologist: Chanda Busing RVT  Examination Guidelines: A complete evaluation includes B-mode imaging, spectral Doppler, color Doppler, and power Doppler as needed of all accessible portions of each vessel. Bilateral testing is considered an integral part of a complete examination. Limited examinations for reoccurring indications may be performed as noted. The reflux portion of the exam is performed with the patient in reverse Trendelenburg.  +---------+---------------+---------+-----------+----------+-----------------+ RIGHT    CompressibilityPhasicitySpontaneityPropertiesThrombus Aging    +---------+---------------+---------+-----------+----------+-----------------+ CFV      Full           Yes      Yes                                    +---------+---------------+---------+-----------+----------+-----------------+ SFJ      Full                                                           +---------+---------------+---------+-----------+----------+-----------------+ FV Prox  Full                                                           +---------+---------------+---------+-----------+----------+-----------------+ FV Mid   Full                                                           +---------+---------------+---------+-----------+----------+-----------------+ FV DistalFull                                                            +---------+---------------+---------+-----------+----------+-----------------+ PFV      Full                                                           +---------+---------------+---------+-----------+----------+-----------------+ POP      Full           Yes      Yes                                    +---------+---------------+---------+-----------+----------+-----------------+  PTV      Full                                                           +---------+---------------+---------+-----------+----------+-----------------+ PERO     Partial                                      Age Indeterminate +---------+---------------+---------+-----------+----------+-----------------+   +---------+---------------+---------+-----------+----------+-----------------+ LEFT     CompressibilityPhasicitySpontaneityPropertiesThrombus Aging    +---------+---------------+---------+-----------+----------+-----------------+ CFV      Full           Yes      Yes                                    +---------+---------------+---------+-----------+----------+-----------------+ SFJ      Full                                                           +---------+---------------+---------+-----------+----------+-----------------+ FV Prox  Full                                                           +---------+---------------+---------+-----------+----------+-----------------+ FV Mid   Full                                                           +---------+---------------+---------+-----------+----------+-----------------+ FV DistalFull                                                           +---------+---------------+---------+-----------+----------+-----------------+ PFV      Full                                                           +---------+---------------+---------+-----------+----------+-----------------+ POP      Partial        Yes       Yes                  Age Indeterminate +---------+---------------+---------+-----------+----------+-----------------+ PTV      Full                                                           +---------+---------------+---------+-----------+----------+-----------------+  PERO     Partial                                      Age Indeterminate +---------+---------------+---------+-----------+----------+-----------------+     Summary: RIGHT: - Findings consistent with age indeterminate deep vein thrombosis involving the right peroneal veins. - No cystic structure found in the popliteal fossa.  LEFT: - Findings consistent with age indeterminate deep vein thrombosis involving the left popliteal vein, and left peroneal veins. - No cystic structure found in the popliteal fossa.  *See table(s) above for measurements and observations. Electronically signed by Lemar Livings MD on 06/03/2020 at 8:17:57 PM.    Final    No results for input(s): WBC, HGB, HCT, PLT in the last 72 hours. No results for input(s): NA, K, CL, CO2, GLUCOSE, BUN, CREATININE, CALCIUM in the last 72 hours.  Intake/Output Summary (Last 24 hours) at 06/05/2020 1105 Last data filed at 06/04/2020 1903 Gross per 24 hour  Intake 460 ml  Output --  Net 460 ml     Physical Exam: Vital Signs Blood pressure (!) 163/89, pulse 79, temperature 97.8 F (36.6 C), temperature source Oral, resp. rate 19, height 5\' 5"  (1.651 m), weight 105.4 kg, SpO2 100 %. Constitutional: No distress . Vital signs reviewed. HENT: Normocephalic.  Atraumatic. Eyes: EOMI. No discharge. Cardiovascular: No JVD. Respiratory: Normal effort.  No stridor. GI: Non-distended. Skin: Neck incision C/D/I Psych: Normal mood.  Normal behavior. Musc: No edema in extremities.  No tenderness in extremities. Neuro: Alert Motor: Grossly 4/5 throughout  Assessment/Plan: 1. Functional deficits secondary to C7-T1 cord compression with myelopathy , s/p decompression  which  require 3+ hours per day of interdisciplinary therapy in a comprehensive inpatient rehab setting.  Physiatrist is providing close team supervision and 24 hour management of active medical problems listed below.  Physiatrist and rehab team continue to assess barriers to discharge/monitor patient progress toward functional and medical goals  Care Tool:  Bathing    Body parts bathed by patient: Right arm, Left arm, Chest, Abdomen, Front perineal area, Right upper leg, Left upper leg, Face, Buttocks   Body parts bathed by helper: Right lower leg, Left lower leg     Bathing assist Assist Level: Minimal Assistance - Patient > 75%     Upper Body Dressing/Undressing Upper body dressing   What is the patient wearing?: Pull over shirt    Upper body assist Assist Level: Set up assist    Lower Body Dressing/Undressing Lower body dressing      What is the patient wearing?: Underwear/pull up, Pants     Lower body assist Assist for lower body dressing: Contact Guard/Touching assist     Toileting Toileting    Toileting assist Assist for toileting: Supervision/Verbal cueing     Transfers Chair/bed transfer  Transfers assist  Chair/bed transfer activity did not occur: N/A  Chair/bed transfer assist level: Contact Guard/Touching assist     Locomotion Ambulation   Ambulation assist   Ambulation activity did not occur: N/A  Assist level: Contact Guard/Touching assist Assistive device: Walker-rolling Max distance: 45   Walk 10 feet activity   Assist  Walk 10 feet activity did not occur: N/A  Assist level: Contact Guard/Touching assist Assistive device: Walker-rolling   Walk 50 feet activity   Assist Walk 50 feet with 2 turns activity did not occur: N/A         Walk 150 feet  activity   Assist Walk 150 feet activity did not occur: N/A         Walk 10 feet on uneven surface  activity   Assist Walk 10 feet on uneven surfaces activity did not occur:  N/A         Wheelchair     Assist Will patient use wheelchair at discharge?: Yes Type of Wheelchair: Manual Wheelchair activity did not occur: Safety/medical concerns  Wheelchair assist level: Moderate Assistance - Patient 50 - 74%      Wheelchair 50 feet with 2 turns activity    Assist        Assist Level: Dependent - Patient 0%   Wheelchair 150 feet activity     Assist      Assist Level: Dependent - Patient 0%   Blood pressure (!) 163/89, pulse 79, temperature 97.8 F (36.6 C), temperature source Oral, resp. rate 19, height 5\' 5"  (1.651 m), weight 105.4 kg, SpO2 100 %.  Medical Problem List and Plan: 1.Left leg weaknesssecondary to large herniated disc at C7-T1/cord compression. Status post anterior cervical discectomy with decompression 05/27/2020. Cervical collar as directed  Continue CIR  2. Antithrombotics: -DVT/anticoagulation:Xarelto. Vascular study is positive for indeterminate bilateral peroneal DVT.  Repeat on 7/9 shows new popliteal DVT and stable prior.  -antiplatelet therapy: N/A 3. Pain Management:Neurontin 1200 mg nightly and 1500 mg daily, Flexeril 10 mg 3 times daily as needed, oxycodone and tramadol as needed  Some C8 radicular pain as expected    Scheduled Meloxicam as per patient's request.   Added Prednisone 10mg  TID for patient's right arm neuropathic pain to help decreased nerve root compression.   Diclofenac gel d/ced as pt on Mobic.  Controlled with meds on 7/10 4. Mood:Xanax as needed -antipsychotic agents: N/A 5. Neuropsych: This patientiscapable of making decisions on herown behalf. 6. Skin/Wound Care:Routine skin checks 7. Fluids/Electrolytes/Nutrition:Routine in and outs 8. Hypertension. Lotensin 20 mg daily, HCTZ 25 mg daily, Coreg 6.25 mg twice daily. Monitor with increased mobility Vitals:   06/04/20 1928 06/05/20 0404  BP: (!) 156/87 (!) 163/89  Pulse: 78 79  Resp: 18 19   Temp: 98.1 F (36.7 C) 97.8 F (36.6 C)  SpO2: 96% 100%   D/ced HCTZ to 6.25mg .   Coreg increased back to 6.25 on 7/10 9. Hypothyroidism. Synthroid 10. Hyperlipidemia. Crestor 11. Macrocytosis without anemia. MCV of 110.5 on 7/5, labs ordered for Monday  Vitamin B12/folate within normal limits 12. History of alcohol use. Provide counseling 13. Constipation. Laxative assistance 14.  Hypoalbuminemia  Supplement initiated 7/10   LOS: 7 days A FACE TO FACE EVALUATION WAS PERFORMED  Tavian Callander Karis Jubanil Daylan Boggess 06/05/2020, 11:05 AM

## 2020-06-05 NOTE — Progress Notes (Signed)
Physical Therapy Session Note  Patient Details  Name: [name] MRN: [id number] Date of Birth: December 17, 1945  Today's Date: 06/05/2020 PT Individual Time: 0902-1000 PT Individual Time Calculation (min): 58 min   Short Term Goals: Week 1:  PT Short Term Goal 1 (Week 1): Pt will be able to perform bed mobility with min assist PT Short Term Goal 2 (Week 1): Pt will be able to perform transfers with CGA PT Short Term Goal 3 (Week 1): Pt will be able to gait x 50' with min assist PT Short Term Goal 4 (Week 1): Pt will be able to perform 2 steps with mod assist  Skilled Therapeutic Interventions/Progress Updates: Pt presents long sitting in bed w/ HOB almost totally elevated, taking medications.   Pt agreeable to therapy.  Pt transferred to EOB w/ supervision.  Attempted socks over compression socks but unable so total A to don sandals.  Pt states feels good.  Pt transferred sit to stand w/ CGA and amb short distance to w/c w/ RW and CGA.  Pt stood at sink to brush teeth w/o UE support.  Pt wheeled to gym for energy and time conservation.  Pt performed standing toe taps to 3' step (at stairs) 3 x 10 w/ noted decreased ability to clear step w/ left LE.  Pt stood to perform UE activity simultaneously w/ Legos.  Pt performed w/ decreased BOS to challenge balance as well as standing w/o UE support and then w/ eyes closed x 20 sec, w/ noted sway.  Pt amb multiple trials w/ RW and CGA up to 45' before requiring seated rest break.  Pt required verbal cues for correct sequencing (left foot first) to improve step length and increase toe clearance.  Pt amb to recliner and remained in seat w/ LEs elevated, seat alarm on and all needs in reach.     Therapy Documentation Precautions:  Precautions Precautions: Fall, Cervical Precaution Comments: Verbally reviewed cervical precautions.  Restrictions Weight Bearing Restrictions: No General:   Vital Signs:  Pain: pt states soreness Pain Assessment  Pain Scale: 0-10 Pain Score: 8  Pain Type: Acute pain Pain Location: Shoulder Pain Descriptors / Indicators: Aching Pain Frequency: Intermittent Pain Onset: On-going Patients Stated Pain Goal: 2 Pain Intervention(s): Medication (See eMAR) Mobility:        Therapy/Group: Individual Therapy  Lucio Edward 06/05/2020, 12:21 PM

## 2020-06-06 ENCOUNTER — Inpatient Hospital Stay (HOSPITAL_COMMUNITY): Payer: Medicare PPO | Admitting: Occupational Therapy

## 2020-06-06 DIAGNOSIS — K5903 Drug induced constipation: Secondary | ICD-10-CM

## 2020-06-06 MED ORDER — DOCUSATE SODIUM 100 MG PO CAPS
200.0000 mg | ORAL_CAPSULE | Freq: Two times a day (BID) | ORAL | Status: DC
  Filled 2020-06-06 (×4): qty 2

## 2020-06-06 NOTE — Progress Notes (Signed)
Occupational Therapy Session Note  Patient Details  Name: [name] MRN: [id number] Date of Birth: January 15, 1946  Today's Date: 06/06/2020 OT Individual Time: 1003-1101 OT Individual Time Calculation (min): 58 min    Short Term Goals: Week 1:  OT Short Term Goal 1 (Week 1): Pt will perform 3/3 toileting tasks with CGA OT Short Term Goal 2 (Week 1): Pt will perform LB dressing with AE as needed Min A OT Short Term Goal 3 (Week 1): Pt will perform various ADL transfers with CGA  Skilled Therapeutic Interventions/Progress Updates:  Patient met standing from commode with nursing staff in agreement with OT treatment session with focus on functional mobility/transfers in prep for BADLs and therapeutic ex. as detailed below. Patient ambulated ~36f from room toward nurses station with RW and CGA requiring seated RB 2/2 patient with report of bilateral knee weakness. Total A for wc transport to ADL apartment. OT provided education on walk-in shower transfers to shower chair with patient return demonstrating with good accuracy. Patient with questions on specifics for installation of grab bars with OT referring patient to ADA website. BUE strengthening with use of arm ergometer with patient tolerating 1 bout of 15 minutes without rest breaks on level 3 with RMP 19-25. Session concluded with patient seated in wc with call bell within reach and all needs met.   Therapy Documentation Precautions:  Precautions Precautions: Fall, Cervical Precaution Comments: Verbally reviewed cervical precautions.  Restrictions Weight Bearing Restrictions: No General:    Therapy/Group: Individual Therapy  Obera Stauch R How[place]-Davis 06/06/2020, 10:45 AM

## 2020-06-06 NOTE — Progress Notes (Signed)
[facility] PHYSICAL MEDICINE & REHABILITATION PROGRESS NOTE   Subjective/Complaints: Patient seen sitting up in bed this morning.  She states she slept well overnight.  She notes improvement in pain.  She denies complaints.  ROS: Denies CP , SOB, N/V/D   Objective:   No results found. No results for input(s): WBC, HGB, HCT, PLT in the last 72 hours. No results for input(s): NA, K, CL, CO2, GLUCOSE, BUN, CREATININE, CALCIUM in the last 72 hours.  Intake/Output Summary (Last 24 hours) at 06/06/2020 0810 Last data filed at 06/05/2020 1911 Gross per 24 hour  Intake 240 ml  Output --  Net 240 ml     Physical Exam: Vital Signs Blood pressure (!) 150/75, pulse 72, temperature 97.8 F (36.6 C), temperature source Oral, resp. rate 19, height 5\' 5"  (1.651 m), weight 105.4 kg, SpO2 97 %. Constitutional: No distress . Vital signs reviewed. HENT: Normocephalic.  Atraumatic. Eyes: EOMI. No discharge. Cardiovascular: No JVD. Respiratory: Normal effort.  No stridor. GI: Non-distended. Skin: Neck incision C/D/I Psych: Normal mood.  Normal behavior. Musc: No edema in extremities.  No tenderness in extremities. Neuro: Alert Motor: Grossly 4-4+/5 throughout  Assessment/Plan: 1. Functional deficits secondary to C7-T1 cord compression with myelopathy , s/p decompression  which require 3+ hours per day of interdisciplinary therapy in a comprehensive inpatient rehab setting.  Physiatrist is providing close team supervision and 24 hour management of active medical problems listed below.  Physiatrist and rehab team continue to assess barriers to discharge/monitor patient progress toward functional and medical goals  Care Tool:  Bathing    Body parts bathed by patient: Right arm, Left arm, Chest, Abdomen, Front perineal area, Right upper leg, Left upper leg, Face, Buttocks   Body parts bathed by helper: Right lower leg, Left lower leg     Bathing assist Assist Level: Minimal Assistance -  Patient > 75%     Upper Body Dressing/Undressing Upper body dressing   What is the patient wearing?: Pull over shirt    Upper body assist Assist Level: Set up assist    Lower Body Dressing/Undressing Lower body dressing      What is the patient wearing?: Underwear/pull up, Pants     Lower body assist Assist for lower body dressing: Contact Guard/Touching assist     Toileting Toileting    Toileting assist Assist for toileting: Supervision/Verbal cueing     Transfers Chair/bed transfer  Transfers assist  Chair/bed transfer activity did not occur: N/A  Chair/bed transfer assist level: Contact Guard/Touching assist     Locomotion Ambulation   Ambulation assist   Ambulation activity did not occur: N/A  Assist level: Contact Guard/Touching assist Assistive device: Walker-rolling Max distance: 45   Walk 10 feet activity   Assist  Walk 10 feet activity did not occur: N/A  Assist level: Contact Guard/Touching assist Assistive device: Walker-rolling   Walk 50 feet activity   Assist Walk 50 feet with 2 turns activity did not occur: N/A         Walk 150 feet activity   Assist Walk 150 feet activity did not occur: N/A         Walk 10 feet on uneven surface  activity   Assist Walk 10 feet on uneven surfaces activity did not occur: N/A         Wheelchair     Assist Will patient use wheelchair at discharge?: Yes Type of Wheelchair: Manual Wheelchair activity did not occur: Safety/medical concerns  Wheelchair assist level: Moderate  Assistance - Patient 50 - 74%      Wheelchair 50 feet with 2 turns activity    Assist        Assist Level: Dependent - Patient 0%   Wheelchair 150 feet activity     Assist      Assist Level: Dependent - Patient 0%   Blood pressure (!) 150/75, pulse 72, temperature 97.8 F (36.6 C), temperature source Oral, resp. rate 19, height 5\' 5"  (1.651 m), weight 105.4 kg, SpO2 97 %.  Medical  Problem List and Plan: 1. Weaknesssecondary to large herniated disc at C7-T1/cord compression. Status post anterior cervical discectomy with decompression 05/27/2020. Cervical collar as directed  Continue CIR  2. Antithrombotics: -DVT/anticoagulation:Xarelto. Vascular study is positive for indeterminate bilateral peroneal DVT.  Repeat on 7/9 shows new popliteal DVT and stable prior.  -antiplatelet therapy: N/A 3. Pain Management:Neurontin 1200 mg nightly and 1500 mg daily, Flexeril 10 mg 3 times daily as needed, oxycodone and tramadol as needed  Some C8 radicular pain as expected    Scheduled Meloxicam as per patient's request.   Added Prednisone 10mg  TID for patient's right arm neuropathic pain to help decreased nerve root compression.   Diclofenac gel d/ced as pt on Mobic.  Controlled with meds on 7/11 4. Mood:Xanax as needed -antipsychotic agents: N/A 5. Neuropsych: This patientiscapable of making decisions on herown behalf. 6. Skin/Wound Care:Routine skin checks 7. Fluids/Electrolytes/Nutrition:Routine in and outs 8. Hypertension. Lotensin 20 mg daily, HCTZ 25 mg daily, Coreg 6.25 mg twice daily. Monitor with increased mobility Vitals:   06/05/20 1933 06/06/20 0521  BP: (!) 159/75 (!) 150/75  Pulse: 78 72  Resp: 19   Temp: 98.7 F (37.1 C) 97.8 F (36.6 C)  SpO2: 99% 97%   D/ced HCTZ to 6.25mg .   Coreg increased back to 6.25 on 7/10  Elevated on 7/11, will not make any further changes today given recent changes, however will continue to monitor and consider further adjustments as necessary 9. Hypothyroidism. Synthroid 10. Hyperlipidemia. Crestor 11. Macrocytosis without anemia. MCV of 110.5 on 7/5, labs ordered for tomorrow  Vitamin B12/folate within normal limits 12. History of alcohol use. Provide counseling 13.  Drug-inducedconstipation. Laxative assistance  Bowel meds increased on 7/11 14.  Hypoalbuminemia  Supplement  initiated 7/10   LOS: 8 days A FACE TO FACE EVALUATION WAS PERFORMED  [name] 9/5 06/06/2020, 8:10 AM

## 2020-06-07 ENCOUNTER — Inpatient Hospital Stay (HOSPITAL_COMMUNITY): Payer: Medicare PPO | Admitting: Occupational Therapy

## 2020-06-07 ENCOUNTER — Inpatient Hospital Stay (HOSPITAL_COMMUNITY): Payer: Medicare PPO | Admitting: Physical Therapy

## 2020-06-07 ENCOUNTER — Encounter (HOSPITAL_COMMUNITY): Payer: Medicare PPO | Admitting: Psychology

## 2020-06-07 ENCOUNTER — Inpatient Hospital Stay (HOSPITAL_COMMUNITY): Payer: Medicare PPO

## 2020-06-07 LAB — CBC
HCT: 36.2 % (ref 36.0–46.0)
Hemoglobin: 11.6 g/dL — ABNORMAL LOW (ref 12.0–15.0)
MCH: 34.9 pg — ABNORMAL HIGH (ref 26.0–34.0)
MCHC: 32 g/dL (ref 30.0–36.0)
MCV: 109 fL — ABNORMAL HIGH (ref 80.0–100.0)
Platelets: 324 10*3/uL (ref 150–400)
RBC: 3.32 MIL/uL — ABNORMAL LOW (ref 3.87–5.11)
RDW: 12.9 % (ref 11.5–15.5)
WBC: 10.8 10*3/uL — ABNORMAL HIGH (ref 4.0–10.5)
nRBC: 0 % (ref 0.0–0.2)

## 2020-06-07 NOTE — Progress Notes (Addendum)
Patient ID: [name], female   DOB: Sep 20, 1946, 74 y.o.   MRN: [id number]  Pt preferred [facility] is [facility]. SW sent referral for HHPT/OT to [facility] and waiting on follow-up.  *Sw received updates from [facility] confirming referral was accepted.  Cecile Sheerer, MSW, LCSWA Office: [phone] Cell: [phone] Fax: [phone]

## 2020-06-07 NOTE — Consult Note (Signed)
Neuropsychological Consultation   Patient:   [name]   DOB:   [birth year]  MR Number:  [id number]  Location:  [facility] [address] CENTER B [id number] [place] [address] Dept: [phone] Loc: [phone]           Date of Service:   06/07/2020  Start Time:   9 AM End Time:   2 AM  Provider/Observer:  [name], Psy.D.       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  The patient presented on 05/24/2020 with left leg weakness after period of a couple of days of increasing difficulties with mobility and motor function.  The patient denied recent falls but had had a couple of significant falls over the prior year.  CT/MRI of brain negative for acute changes.  MRI of the spine showed multilevel degenerative changes throughout the lumbar spine negative for fracture.  Mild to moderate stenosis regions in the lumbar region.  However, cervical MRI demonstrated a large herniated disc at C7-T1.  Underwent cervical discectomy with decompression and interbody prosthesis 05/27/2020.  Patient was admitted to the comprehensive rehab program for intensive rehab work prior to discharge home.  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 Per chart review patient lives with spouse. Two-level home half bath on main level bed and bath upstairs and 2 steps to entry. She walks with a cane outside the home. Husband does  assist with some basic ADLs. Presented 05/24/2020 left leg weakness. Denies any recent fall. No bowel or bladder disturbances. Admission chemistries unremarkable except sodium 133, urinalysis negative nitrite, hemoglobin 12.8. CT/MRI of the brain negative for acute changes. MRI of the spine showed multilevel degenerative changes throughout the lumbar spine negative for fracture. Mild spinal stenosis L3-4 with mild subarticular stenosis bilaterally. Moderate spinal stenosis L4-5. Cervical MRI demonstrated a large herniated disc at C7-T1. Patient underwent C7-T1 anterior cervical discectomy with decompression, interbody arthrodesis with insertion of interbody prosthesis 05/27/2020 per Dr. Lovell Sheehan. Decadron protocol as indicated and course has been completed. She was placed in a cervical collar. Therapy evaluations completed and patient was admitted for a comprehensive rehab program.  Current Status:  Patient reports that she is doing much better as she has regained motor function of all limbs although particularly her left leg is very weak.  She is moving both of her arms.  The patient reports that she has a lot of worry and anxiety when they told her what was actually going on after the MRI of her neck.  The patient had worried that she had had some type of stroke or bleed.  The patient reports that being told that she may be paralyzed from the surgery itself or would be paralyzed if she did not have the surgery did exacerbate an increase anxiety and fear.  However, she reports that there was so little time between being given the news and scheduled for surgery that she did not have time to really be consumed by her worry or fear.  Now that she is seeing significant improvement and gains in his schedule for discharge soon she has been doing much better.  She has been problem-solving how she will get back doing things at home with her immediate limitations.  Behavioral Observation: [name]   presents as a 74 y.o.-year-old Right Caucasian Female who appeared her stated age. her dress was Appropriate and she was Well Groomed and her manners were Appropriate to the situation.  her participation was indicative of Appropriate and Attentive behaviors.  There were  physical disabilities noted.  she displayed an appropriate level of cooperation and motivation.     Interactions:    Active Appropriate and Attentive  Attention:   within normal limits and attention span and concentration were age appropriate  Memory:   within normal limits; recent and remote memory intact  Visuo-spatial:  within normal limits  Speech (Volume):  normal  Speech:   normal; normal  Thought Process:  Coherent and Relevant  Though Content:  WNL; not suicidal and not homicidal  Orientation:   person, place, time/date and situation  Judgment:   Good  Planning:   Good  Affect:    Appropriate  Mood:    Anxious  Insight:   Good  Intelligence:   high  Medical History:   Past Medical History:  Diagnosis Date  . Alcohol abuse 04/25/2015  . Allergy   . Anastomotic ulcer S/P gastric bypass 06/18/2016  . Arthritis   . Cataract   . Degenerative disc disease, cervical   . GAD (generalized anxiety disorder) 06/14/2016  . HTN (hypertension) 04/25/2015  . Hypertension   . Hypothyroidism   . 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 Substance abuse (HCC)    Alcohol abuse 2016  . UGIB (upper gastrointestinal bleed) 04/25/2015   Psychiatric History:  That patient does have a prior history of generalized anxiety disorder along with alcohol use and abuse in the past.  Family Med/Psych History:  Family History  Problem Relation Age of Onset  . Heart disease Mother   . Stroke Mother 74  . Cancer Mother        skin and breast  . Hypertension Mother   . Breast cancer Mother 66  . COPD Father   . Cancer Father   . Diabetes Father   . Hypertension Father   .  Stroke Maternal Grandmother   . Mental illness Maternal Grandmother   . Breast cancer Sister   . 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  The patient presented on 05/24/2020 with left leg weakness after period of a couple of days of increasing difficulties with mobility and motor function.  The patient denied recent falls but had had a couple of significant falls over the prior year.  CT/MRI of brain negative for acute changes.  MRI of the spine showed multilevel degenerative changes throughout the lumbar spine negative for fracture.  Mild to moderate stenosis regions in the lumbar region.  However, cervical MRI demonstrated a large herniated disc at C7-T1.  Underwent cervical discectomy with decompression and interbody prosthesis 05/27/2020.  Patient was admitted to the comprehensive rehab program for intensive rehab work prior to discharge home.  Patient reports that she is doing much better  as she has regained motor function of all limbs although particularly her left leg is very weak.  She is moving both of her arms.  The patient reports that she has a lot of worry and anxiety when they told her what was actually going on after the MRI of her neck.  The patient had worried that she had had some type of stroke or bleed.  The patient reports that being told that she may be paralyzed from the surgery itself or would be paralyzed if she did not have the surgery did exacerbate an increase anxiety and fear.  However, she reports that there was so little time between being given the news and scheduled for surgery that she did not have time to really be consumed by her worry or fear.  Now that she is seeing significant improvement and gains in his schedule for discharge soon  she has been doing much better.  She has been problem-solving how she will get back doing things at home with her immediate limitations.  Disposition/Plan:  The patient will be discharged soon and appears to be doing well and reducing anxiety symptoms and coping with what always happened.  I did let her know that if there does develop increasing negative mood states when she is discharged home that she will have a follow-up with Dr. Berline Chough in the outpatient clinic within the first couple of weeks after discharge.  I informed the patient to let us know if things do develop as I will also be available on an outpatient basis for follow-up as well.         Electronically Signed   _______________________ [name], Psy.D.

## 2020-06-07 NOTE — Progress Notes (Signed)
Occupational Therapy Weekly Progress Note  Patient Details  Name: [name] MRN: [id number] Date of Birth: 1946-11-26  Beginning of progress report period: May 30, 2020 End of progress report period: June 07, 2020  Patient has met 3 of 3 short term goals.  Pt is making steady progress with BADLs and functional transfers. Pt currently completes bathing/dressing with sit<>stand from w/c at sink.  Pt requires min A for LB bathing/dressing.  Functional tranfsers and functional amb with RW with CGA/close supervision.   Patient continues to demonstrate the following deficits: muscle weakness, unbalanced muscle activation and decreased coordination and decreased standing balance and therefore will continue to benefit from skilled OT intervention to enhance overall performance with BADL.  Patient progressing toward long term goals..  Continue plan of care.  OT Short Term Goals Week 1:  OT Short Term Goal 1 (Week 1): Pt will perform 3/3 toileting tasks with CGA OT Short Term Goal 1 - Progress (Week 1): Met OT Short Term Goal 2 (Week 1): Pt will perform LB dressing with AE as needed Min A OT Short Term Goal 2 - Progress (Week 1): Met OT Short Term Goal 3 (Week 1): Pt will perform various ADL transfers with CGA OT Short Term Goal 3 - Progress (Week 1): Met Week 2:  OT Short Term Goal 1 (Week 2): STG=LTG secondary to ELOS   Leroy Libman 06/07/2020, 6:35 AM

## 2020-06-07 NOTE — Progress Notes (Signed)
Occupational Therapy Session Note  Patient Details  Name: [name] MRN: [id number] Date of Birth: May 24, 1946  Today's Date: 06/07/2020 OT Individual Time: 5747-3403 OT Individual Time Calculation (min): 94 min    Short Term Goals: Week 1:  OT Short Term Goal 1 (Week 1): Pt will perform 3/3 toileting tasks with CGA OT Short Term Goal 1 - Progress (Week 1): Met OT Short Term Goal 2 (Week 1): Pt will perform LB dressing with AE as needed Min A OT Short Term Goal 2 - Progress (Week 1): Met OT Short Term Goal 3 (Week 1): Pt will perform various ADL transfers with CGA OT Short Term Goal 3 - Progress (Week 1): Met  Skilled Therapeutic Interventions/Progress Updates:    Pt requesting to bathe and toilet during OT session today. Pts husband arrived for second half of session. Pt reports no pain throughout session.  OT managed cervical collar and applied waterproof barrier to anterior neck incision, and redonned cervical collar with max assist while pt sitting EOB.  Pt completed functional ambulation from EOB to toilet and stand to sit using RW and grab bars with CGA.  Pt had continence of urine, completed pericare with supervision needing occasional VCs to maintain spinal precautions.  Pt doffed pants, underwear, and socks with reacher needing min VCs for technique.    Pt transferred to shower bench using grab bars and RW with CGA.  Pt bathed UB and LB using long handled sponge with CGA during standing portion to bathe buttocks with VCs to ensure spinal precautions maintained.  Educated pt and husband regarding safety techniques in shower.  Pt completed functional mobility to EOB with RW with close CGA.   Pt needing max assist for cervical collar management to replace foam padding and removal of waterproof barrier.  Pt donned overhead shirt with setup and LB dressing completed using reacher with supervision. Pt completed sit to supine needing min assist and mod VCs to ensure spinal  precaution follow through.  Call bell in reach, bed alarm on.  Therapy Documentation Precautions:  Precautions Precautions: Fall, Cervical Precaution Comments: Verbally reviewed cervical precautions.  Restrictions Weight Bearing Restrictions: No   Therapy/Group: Individual Therapy  Ezekiel Slocumb 06/07/2020, 5:02 PM

## 2020-06-07 NOTE — Progress Notes (Signed)
Physical Therapy Weekly Progress Note  Patient Details  Name: [name] MRN: [id number] Date of Birth: [birth year]  Beginning of progress report period: May 30, 2020 End of progress report period: June 07, 2020  Today's Date: 06/07/2020 PT Individual Time: 1000-1100 PT Individual Time Calculation (min): 60 min   Patient has met 4 of 4 short term goals.  Pt performs bed mobility with close supervision and transfers with CGA and RW. Pt has ambulated 3 x 50 feet with RW and CGA. Pt has ascended/descended 8 x 6 inch stairs using B/L rails with min A ascending, mod A descending, and a step-to pattern.   Patient continues to demonstrate the following deficits muscle weakness, decreased cardiorespiratoy endurance, ataxia and decreased coordination and decreased sitting balance, decreased standing balance, decreased postural control and decreased balance strategies and therefore will continue to benefit from skilled PT intervention to increase functional independence with mobility.  Patient progressing toward long term goals..  Continue plan of care.  PT Short Term Goals Week 1:  PT Short Term Goal 1 (Week 1): Pt will be able to perform bed mobility with min assist PT Short Term Goal 1 - Progress (Week 1): Met PT Short Term Goal 2 (Week 1): Pt will be able to perform transfers with CGA PT Short Term Goal 2 - Progress (Week 1): Met PT Short Term Goal 3 (Week 1): Pt will be able to gait x 50' with min assist PT Short Term Goal 3 - Progress (Week 1): Met PT Short Term Goal 4 (Week 1): Pt will be able to perform 2 steps with mod assist PT Short Term Goal 4 - Progress (Week 1): Met Week 2:  PT Short Term Goal 1 (Week 2): STG = LTG d/t ELOS  Skilled Therapeutic Interventions/Progress Updates:  Pt received seated in recliner in room with [place] in place. Agreeable to therapy. Denies any pain at rest prior to start of therapy session. Pt dependent to don sandals, which she wears at home, while  seated in recliner. Stand pivot transfer from recliner to W/C with RW and CGA. Dependent W/C transfer to hallway for time conservation. Pt performed sit <> stand with RW and CGA throughout session. Ambulated 2 x 50 feet with RW and CGA. Pt demonstrates L hip flexor weakness with circumduction of LLE and difficulty clearing L foot. Ascended/descended a total of 8 x 6 inch stairs using B/L rails with min A ascending, mod A descending, and a step-to pattern. Pt again demonstrates L hip flexor weakness and difficulty clearing the L foot. Verbal cues required for correct foot placement on stairs. Standing in the parallel bars, performed sidestepping R/L 2 x 20 feet each direction holding onto one rail with both hands and CGA for balance/hip abductor strengthening. Ambulated 1 x 50 feet with RW and CGA. Dependent W/C transfer to room for time conservation. Stand pivot transfer from W/C to EOB with RW and CGA. Sit to supine in bed with close supervision. Pt left semi-reclined in bed with all needs in reach. All questions answered.  Therapy Documentation Precautions:  Precautions Precautions: Fall, Cervical Precaution Comments: Verbally reviewed cervical precautions.  Restrictions Weight Bearing Restrictions: No  Therapy/Group: Individual Therapy  Nadeen Landau, SPT 06/07/2020, 3:40 PM

## 2020-06-07 NOTE — Progress Notes (Signed)
[place] PHYSICAL MEDICINE & REHABILITATION PROGRESS NOTE   Subjective/Complaints:  Pt reports pain doing pretty well- does also note decreased awareness of bladder filling- but can void- bowels fine.   Asking in detail about intimacy and sexuality- Is aware of Xarelto need for DVT's   ROS: Pt denies SOB, abd pain, CP, N/V/C/D, and vision changes   Objective:   No results found. Recent Labs    06/07/20 0824  WBC 10.8*  HGB 11.6*  HCT 36.2  PLT 324   No results for input(s): NA, K, CL, CO2, GLUCOSE, BUN, CREATININE, CALCIUM in the last 72 hours. No intake or output data in the 24 hours ending 06/07/20 1941   Physical Exam: Vital Signs Blood pressure (!) 151/80, pulse 68, temperature 97.8 F (36.6 C), temperature source Oral, resp. rate 17, height 5\' 5"  (1.651 m), weight 105.4 kg, SpO2 98 %. Constitutional: No distress . Vital signs reviewed.sitting up in bed; very aware of her medical issues, NAD HENT: Normocephalic.  Atraumatic. Eyes: conjugate gaze Cardiovascular: RRR Respiratory: CTA B/L- no W/R/R- good air movement GI: Soft, NT, ND, (+)BS  Skin: Neck incision C/D/I- anterior neck incision Psych: appropriate Musc: No edema in extremities.  No tenderness in extremities. Neuro: Alert Motor: Grossly 4-4+/5 throughout  Assessment/Plan: 1. Functional deficits secondary to C7-T1 cord compression with myelopathy , s/p decompression  which require 3+ hours per day of interdisciplinary therapy in a comprehensive inpatient rehab setting.  Physiatrist is providing close team supervision and 24 hour management of active medical problems listed below.  Physiatrist and rehab team continue to assess barriers to discharge/monitor patient progress toward functional and medical goals  Care Tool:  Bathing    Body parts bathed by patient: Right arm, Left arm, Chest, Abdomen, Front perineal area, Right upper leg, Left upper leg, Face, Buttocks, Left lower leg, Right lower leg    Body parts bathed by helper: Right lower leg, Left lower leg     Bathing assist Assist Level: Contact Guard/Touching assist     Upper Body Dressing/Undressing Upper body dressing   What is the patient wearing?: Pull over shirt, Orthosis Orthosis activity level: Performed by helper  Upper body assist Assist Level: Minimal Assistance - Patient > 75% Assistive Device Comment: hard cervical collar  Lower Body Dressing/Undressing Lower body dressing      What is the patient wearing?: Underwear/pull up, Pants     Lower body assist Assist for lower body dressing: Supervision/Verbal cueing     Toileting Toileting    Toileting assist Assist for toileting: Supervision/Verbal cueing     Transfers Chair/bed transfer  Transfers assist  Chair/bed transfer activity did not occur: N/A  Chair/bed transfer assist level: Contact Guard/Touching assist     Locomotion Ambulation   Ambulation assist   Ambulation activity did not occur: N/A  Assist level: Contact Guard/Touching assist Assistive device: Walker-rolling Max distance: 50 feet   Walk 10 feet activity   Assist  Walk 10 feet activity did not occur: N/A  Assist level: Contact Guard/Touching assist Assistive device: Walker-rolling   Walk 50 feet activity   Assist Walk 50 feet with 2 turns activity did not occur: N/A  Assist level: Contact Guard/Touching assist Assistive device: Walker-rolling    Walk 150 feet activity   Assist Walk 150 feet activity did not occur: N/A         Walk 10 feet on uneven surface  activity   Assist Walk 10 feet on uneven surfaces activity did not occur: N/A  Wheelchair     Assist Will patient use wheelchair at discharge?: Yes Type of Wheelchair: Manual Wheelchair activity did not occur: Safety/medical concerns  Wheelchair assist level: Moderate Assistance - Patient 50 - 74%      Wheelchair 50 feet with 2 turns activity    Assist         Assist Level: Dependent - Patient 0%   Wheelchair 150 feet activity     Assist      Assist Level: Dependent - Patient 0%   Blood pressure (!) 151/80, pulse 68, temperature 97.8 F (36.6 C), temperature source Oral, resp. rate 17, height 5\' 5"  (1.651 m), weight 105.4 kg, SpO2 98 %.  Medical Problem List and Plan: 1. Weaknesssecondary to large herniated disc at C7-T1/cord compression. Status post anterior cervical discectomy with decompression 05/27/2020. Cervical collar as directed  Continue CIR  2. Antithrombotics: -DVT/anticoagulation:Xarelto. Vascular study is positive for indeterminate bilateral peroneal DVT.  Repeat on 7/9 shows new popliteal DVT and stable prior.  -antiplatelet therapy: N/A 3. Pain Management:Neurontin 1200 mg nightly and 1500 mg daily, Flexeril 10 mg 3 times daily as needed, oxycodone and tramadol as needed  Some C8 radicular pain as expected    Scheduled Meloxicam as per patient's request.   Added Prednisone 10mg  TID for patient's right arm neuropathic pain to help decreased nerve root compression.   Diclofenac gel d/ced as pt on Mobic.  7/12- pain adequate control per pt 4. Mood:Xanax as needed -antipsychotic agents: N/A 5. Neuropsych: This patientiscapable of making decisions on herown behalf. 6. Skin/Wound Care:Routine skin checks 7. Fluids/Electrolytes/Nutrition:Routine in and outs 8. Hypertension. Lotensin 20 mg daily, HCTZ 25 mg daily, Coreg 6.25 mg twice daily. Monitor with increased mobility Vitals:   06/06/20 2002 06/07/20 1441  BP: (!) 158/79 (!) 151/80  Pulse: 71 68  Resp: 17 17  Temp: 97.8 F (36.6 C)   SpO2: 97% 98%   D/ced HCTZ to 6.25mg .   Coreg increased back to 6.25 on 7/10  Elevated on 7/11, will not make any further changes today given recent changes, however will continue to monitor and consider further adjustments as necessary  7/12- BP in 150s/70s- due to SCI, can have orthostatic  hypotension- pt is aware- suggested waiting a few days to get to make additional changes- would rather be a little high than too low.  9. Hypothyroidism. Synthroid 10. Hyperlipidemia. Crestor 11. Macrocytosis without anemia. MCV of 110.5 on 7/5, labs ordered for tomorrow  Vitamin B12/folate within normal limits 12. History of alcohol use. Provide counseling 13.  Drug-inducedconstipation vs neurogenic bowel. Laxative assistance  Bowel meds increased on 7/11 14.  Hypoalbuminemia  Supplement initiated 7/10 15. Bladder- neurogenic  7/12- discussed decreased awareness of filling and how pertains to SCI 16. Intimacy/sexuality  7/12- affected in SCI- discussed at length how this is important to address in SCI- and how to address at home once leaves CIR.  I spent a total of 40 minutes total care today due to discussion of intimacy/SCI.   LOS: 9 days A FACE TO FACE EVALUATION WAS PERFORMED  [name] 06/07/2020, 7:41 PM

## 2020-06-07 NOTE — Discharge Instructions (Signed)
Inpatient Rehab Discharge Instructions  Teyonna Plaisted Discharge date and time: No discharge date for patient encounter.   Activities/Precautions/ Functional Status: Activity: Cervical collar as directed Diet: regular diet Wound Care: keep wound clean and dry Functional status:  ___ No restrictions     ___ Walk up steps independently ___ 24/7 supervision/assistance   ___ Walk up steps with assistance ___ Intermittent supervision/assistance  ___ Bathe/dress independently ___ Walk with walker     _x__ Bathe/dress with assistance ___ Walk Independently    ___ Shower independently ___ Walk with assistance    ___ Shower with assistance ___ No alcohol     ___ Return to work/school ________   COMMUNITY REFERRALS UPON DISCHARGE:    Home Health:   PT     OT                Agency: [facility]  Phone: [phone] *Please expect follow-up within 2-3 days of discharge to schedule your home visit. If you have not received follow-up, be sure to contact the branch directly.*     Special Instructions: No driving smoking or alcohol   My questions have been answered and I understand these instructions. I will adhere to these goals and the provided educational materials after my discharge from the hospital.  Patient/Caregiver Signature _______________________________ Date __________  Clinician Signature _______________________________________ Date __________  Please bring this form and your medication list with you to all your follow-up doctor's appointments.   Information on my medicine - XARELTO (rivaroxaban)  This medication education was reviewed with me or my healthcare representative as part of my discharge preparation.   WHY WAS XARELTO PRESCRIBED FOR YOU? Xarelto was prescribed to treat blood clots that may have been found in the veins of your legs (deep vein thrombosis) or in your lungs (pulmonary embolism) and to reduce the risk of them occurring  again.  What do you need to know about Xarelto? The starting dose is one 15 mg tablet taken TWICE daily with food for the FIRST 21 DAYS then on 06/24/2020  the dose is changed to one 20 mg tablet taken ONCE A DAY with your evening meal.  DO NOT stop taking Xarelto without talking to the health care provider who prescribed the medication.  Refill your prescription for 20 mg tablets before you run out.  After discharge, you should have regular check-up appointments with your healthcare provider that is prescribing your Xarelto.  In the future your dose may need to be changed if your kidney function changes by a significant amount.  What do you do if you miss a dose? If you are taking Xarelto TWICE DAILY and you miss a dose, take it as soon as you remember. You may take two 15 mg tablets (total 30 mg) at the same time then resume your regularly scheduled 15 mg twice daily the next day.  If you are taking Xarelto ONCE DAILY and you miss a dose, take it as soon as you remember on the same day then continue your regularly scheduled once daily regimen the next day. Do not take two doses of Xarelto at the same time.   Important Safety Information Xarelto is a blood thinner medicine that can cause bleeding. You should call your healthcare provider right away if you experience any of the following: ? Bleeding from an injury or your nose that does not stop. ? Unusual colored urine (red or dark brown) or unusual colored stools (red or black). ? Unusual bruising  for unknown reasons. ? A serious fall or if you hit your head (even if there is no bleeding).  Some medicines may interact with Xarelto and might increase your risk of bleeding while on Xarelto. To help avoid this, consult your healthcare provider or pharmacist prior to using any new prescription or non-prescription medications, including herbals, vitamins, non-steroidal anti-inflammatory drugs (NSAIDs) and supplements.  This website has  more information on Xarelto: VisitDestination.com.br.

## 2020-06-07 NOTE — Progress Notes (Signed)
Occupational Therapy Session Note  Patient Details  Name: [name] MRN: [id number] Date of Birth: 07-01-1946  Today's Date: 06/07/2020 OT Individual Time: 1300-1345 OT Individual Time Calculation (min): 45 min    Short Term Goals: Week 2:  OT Short Term Goal 1 (Week 2): STG=LTG secondary to ELOS  Skilled Therapeutic Interventions/Progress Updates:    Pt resting in bed upon arrival and agreeable to therapy.  Pt desiring to take a shower in a later session but she does not have any replacement pads for cervical collar.  Discussed process using wash cloths.  Relayed info to OTR during scheduled session. Pt practiced stepping over into walk in shower. Pt practiced X 2 with min A. Pt with exaggerated lateral lean to R to accommodate lifting LLE over 4" rise in shower. Pt does not currently have grab bars in shower but states that her husband is going to have some installed. Pt issued orange theraband and instructed in BUE therex for BUE strengthening. Pt remained in bed with bed alarm activated and all needs within reach.    Therapy Documentation Precautions:  Precautions Precautions: Fall, Cervical Precaution Comments: Verbally reviewed cervical precautions.  Restrictions Weight Bearing Restrictions: No  Pain: Pt denies pain, stating she just received pain meds approx one hour before therapy   Therapy/Group: Individual Therapy  Rich Brave 06/07/2020, 2:18 PM

## 2020-06-08 ENCOUNTER — Inpatient Hospital Stay (HOSPITAL_COMMUNITY): Payer: Medicare PPO | Admitting: Physical Therapy

## 2020-06-08 ENCOUNTER — Inpatient Hospital Stay (HOSPITAL_COMMUNITY): Payer: Medicare PPO

## 2020-06-08 ENCOUNTER — Inpatient Hospital Stay (HOSPITAL_COMMUNITY): Payer: Medicare PPO | Admitting: Occupational Therapy

## 2020-06-08 MED ORDER — ALPRAZOLAM 0.5 MG PO TABS
0.5000 mg | ORAL_TABLET | Freq: Every day | ORAL | Status: DC | PRN
  Administered 2020-06-10: 0.5 mg via ORAL
  Filled 2020-06-08: qty 1

## 2020-06-08 NOTE — Progress Notes (Signed)
[place] PHYSICAL MEDICINE & REHABILITATION PROGRESS NOTE   Subjective/Complaints:  Pt reports has 1 "twisted atrophic kidney" so cannot take too much protein at once- refusing protein supplements- wants to take with food. 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 Recent Labs    06/07/20 0824  WBC 10.8*  HGB 11.6*  HCT 36.2  PLT 324   No results for input(s): NA, K, CL, CO2, GLUCOSE, BUN, CREATININE, CALCIUM in the last 72 hours.  Intake/Output Summary (Last 24 hours) at 06/08/2020 0924 Last data filed at 06/07/2020 2233 Gross per 24 hour  Intake 120 ml  Output --  Net 120 ml     Physical Exam: Vital Signs Blood pressure (!) 153/78, pulse 67, temperature 97.6 F (36.4 C), resp. rate 17, height 5\' 5"  (1.651 m), weight 105.4 kg, SpO2 97 %. Constitutional: No distress . Vital signs reviewed.- sitting up in bed- appropriate, NAD HENT: Normocephalic.  Atraumatic. Anterior incision looks great Eyes: conjugate gaze Cardiovascular: RRR Respiratory: CTA B/L- no W/R/R- good air movement GI: Soft, NT, ND, (+)BS  Skin: Neck incision C/D/I- anterior neck incision Psych: appropriate but vague Musc: No edema in extremities.  No tenderness in extremities. Neuro: Alert Motor: Grossly 4-4+/5 throughout  Assessment/Plan: 1. Functional deficits secondary to C7-T1 cord compression with myelopathy , s/p decompression  which require 3+ hours per day of interdisciplinary therapy in a comprehensive inpatient rehab setting.  Physiatrist is providing close team supervision and 24 hour management of active medical problems listed below.  Physiatrist and rehab team continue to assess barriers to discharge/monitor patient progress toward functional and medical goals  Care Tool:  Bathing    Body parts  bathed by patient: Right arm, Left arm, Chest, Abdomen, Front perineal area, Right upper leg, Left upper leg, Face, Buttocks, Left lower leg, Right lower leg   Body parts bathed by helper: Right lower leg, Left lower leg     Bathing assist Assist Level: Contact Guard/Touching assist     Upper Body Dressing/Undressing Upper body dressing   What is the patient wearing?: Pull over shirt, Orthosis Orthosis activity level: Performed by helper  Upper body assist Assist Level: Minimal Assistance - Patient > 75% Assistive Device Comment: hard cervical collar  Lower Body Dressing/Undressing Lower body dressing      What is the patient wearing?: Underwear/pull up, Pants     Lower body assist Assist for lower body dressing: Supervision/Verbal cueing     Toileting Toileting    Toileting assist Assist for toileting: Supervision/Verbal cueing     Transfers Chair/bed transfer  Transfers assist  Chair/bed transfer activity did not occur: N/A  Chair/bed transfer assist level: Contact Guard/Touching assist     Locomotion Ambulation   Ambulation assist   Ambulation activity did not occur: N/A  Assist level: Contact Guard/Touching assist Assistive device: Walker-rolling Max distance: 50 feet   Walk 10 feet activity   Assist  Walk 10 feet activity did not occur: N/A  Assist level: Contact Guard/Touching assist Assistive device: Walker-rolling   Walk 50 feet activity   Assist Walk 50 feet with 2 turns activity did not occur: N/A  Assist level: Contact Guard/Touching assist Assistive device: Walker-rolling    Walk 150 feet activity   Assist Walk 150 feet activity did not occur:  N/A         Walk 10 feet on uneven surface  activity   Assist Walk 10 feet on uneven surfaces activity did not occur: N/A         Wheelchair     Assist Will patient use wheelchair at discharge?: Yes Type of Wheelchair: Manual Wheelchair activity did not occur:  Safety/medical concerns  Wheelchair assist level: Moderate Assistance - Patient 50 - 74%      Wheelchair 50 feet with 2 turns activity    Assist        Assist Level: Dependent - Patient 0%   Wheelchair 150 feet activity     Assist      Assist Level: Dependent - Patient 0%   Blood pressure (!) 153/78, pulse 67, temperature 97.6 F (36.4 C), resp. rate 17, height 5\' 5"  (1.651 m), weight 105.4 kg, SpO2 97 %.  Medical Problem List and Plan: 1. Weaknesssecondary to large herniated disc at C7-T1/cord compression. Status post anterior cervical discectomy with decompression 05/27/2020. Cervical collar as directed  Continue CIR  2. Antithrombotics: -DVT/anticoagulation:Xarelto. Vascular study is positive for indeterminate bilateral peroneal DVT.  Repeat on 7/9 shows new popliteal DVT and stable prior.  -antiplatelet therapy: N/A 3. Pain Management:Neurontin 1200 mg nightly and 1500 mg daily, Flexeril 10 mg 3 times daily as needed, oxycodone and tramadol as needed  Some C8 radicular pain as expected    Scheduled Meloxicam as per patient's request.   Added Prednisone 10mg  TID for patient's right arm neuropathic pain to help decreased nerve root compression.   Diclofenac gel d/ced as pt on Mobic.  7/13- pain controlled- con't regimen 4. Mood:Xanax as needed -antipsychotic agents: N/A 5. Neuropsych: This patientiscapable of making decisions on herown behalf. 6. Skin/Wound Care:Routine skin checks 7. Fluids/Electrolytes/Nutrition:Routine in and outs 8. Hypertension. Lotensin 20 mg daily, HCTZ 25 mg daily, Coreg 6.25 mg twice daily. Monitor with increased mobility Vitals:   06/07/20 1949 06/08/20 0439  BP: (!) 147/72 (!) 153/78  Pulse: 74 67  Resp: 19 17  Temp: 98 F (36.7 C) 97.6 F (36.4 C)  SpO2: 96% 97%   D/ced HCTZ to 6.25mg .   Coreg increased back to 6.25 on 7/10  Elevated on 7/11, will not make any further changes today given  recent changes, however will continue to monitor and consider further adjustments as necessary  7/12- BP in 150s/70s- due to SCI, can have orthostatic hypotension- pt is aware- suggested waiting a few days to get to make additional changes- would rather be a little high than too low.   7/13- BP 140s-150s systolic- will give 1 more day before increasing meds 9. Hypothyroidism. Synthroid 10. Hyperlipidemia. Crestor 11. Macrocytosis without anemia. 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 12. History of alcohol use. Provide counseling 13.  Drug-inducedconstipation vs neurogenic bowel. Laxative assistance  Bowel meds increased on 7/11  7/13- refusing colace- will stop 14.  Hypoalbuminemia  Supplement initiated 7/10 15. Bladder- neurogenic  7/12- discussed decreased awareness of filling and how pertains to SCI 16. Intimacy/sexuality  7/12- affected in SCI- discussed at length how this is important to address in SCI- and how to address at home once leaves CIR.     LOS: 10 days A FACE TO FACE EVALUATION WAS PERFORMED  Ayan Yankey 06/08/2020, 9:24 AM

## 2020-06-08 NOTE — Progress Notes (Addendum)
Patient ID: [name], female   DOB: 09-Mar-1946, 74 y.o.   MRN: [id number]  SW met with pt and pt husband in room to provide updates on [facility] being [facility] for HHPT/OT. Husband will be here for therapies tomorrow. Husband confirms he will purchase TTB. Pt remains on [facility] for d/c.   Loralee Pacas, MSW, [place] Office: [phone] Cell: [phone] Fax: [phone]

## 2020-06-08 NOTE — Discharge Summary (Signed)
Physician Discharge Summary  Patient ID: [name] MRN: [id number] DOB/AGE: 74/03/1946 74 y.o.  Admit date: 05/29/2020 Discharge date: 06/10/2020  Discharge Diagnoses:  Principal Problem:   HNP (herniated nucleus pulposus) with myelopathy, cervical Active Problems:   Spinal cord compression (HCC)   Hypoalbuminemia due to protein-calorie malnutrition (HCC)   Macrocytosis   Postoperative pain   Drug induced constipation Intermediate bilateral peroneal DVT Hypertension Hypothyroidism Hyperlipidemia History of alcohol use Neurogenic bladder Diastolic congestive heart failure Morbid obesity  Discharged Condition: Stable  Significant Diagnostic Studies: DG Cervical Spine 1 View  Result Date: 05/27/2020 CLINICAL DATA:  Cervical spine surgery EXAM: DG CERVICAL SPINE - 1 VIEW COMPARISON:  05/26/2020 FINDINGS: Patient is intubated. There are multiple metallic opacities that project over the neck including what are suspected to be surgical sponges anterior to the C7 level. IMPRESSION: Multiple metallic opacities that project over the neck including what are suspected to be surgical sponges anterior to the C7 level. Electronically Signed   By: Deatra Robinson M.D.   On: 05/27/2020 01:15   DG Cervical Spine 1 View  Result Date: 05/26/2020 CLINICAL DATA:  Surgery. EXAM: DG CERVICAL SPINE - 1 VIEW COMPARISON:  MRI earlier today. FINDINGS: Single lateral view of the cervical spine demonstrates surgical instrument localizing anteriorly at the level of C4-C5. IMPRESSION: Surgical instrument localizing anteriorly at the level of C4-C5. Electronically Signed   By: Narda Rutherford M.D.   On: 05/26/2020 23:06   CT HEAD WO CONTRAST  Result Date: 05/24/2020 CLINICAL DATA:  LEFT leg weakness since Friday EXAM: CT HEAD WITHOUT CONTRAST TECHNIQUE: Contiguous axial images were obtained from the base of the skull through the vertex without intravenous contrast. COMPARISON:  December 04, 2018 FINDINGS:  Brain: No evidence of acute infarction, hemorrhage, hydrocephalus, extra-axial collection or mass lesion/mass effect. Vascular: No hyperdense vessel or unexpected calcification. Skull: Normal. Negative for fracture or focal lesion. Sinuses/Orbits: Chronic opacification of the RIGHT maxillary sinus with similar appearance to the previous imaging study. Orbits are unremarkable. Other: None. IMPRESSION: 1. No acute intracranial abnormality. 2. Chronic RIGHT maxillary sinus disease. 3. Signs of trauma to the RIGHT nasal bone with similar appearance in terms of deformity. Electronically Signed   By: Donzetta Kohut M.D.   On: 05/24/2020 17:45   MR BRAIN WO CONTRAST  Result Date: 05/25/2020 CLINICAL DATA:  Initial evaluation for acute left lower extremity weakness. EXAM: MRI HEAD WITHOUT CONTRAST TECHNIQUE: Multiplanar, multiecho pulse sequences of the brain and surrounding structures were obtained without intravenous contrast. COMPARISON:  Prior head CT from 05/24/2020. FINDINGS: Brain: Generalized age-related cerebral atrophy. Minimal chronic microvascular ischemic changes for age. No abnormal foci of restricted diffusion to suggest acute or subacute ischemia. Gray-white matter differentiation maintained. No encephalomalacia to suggest chronic cortical infarction. No evidence for acute intracranial hemorrhage. Single punctate focus of susceptibility artifact noted at the left parietal region, consistent with a small chronic microhemorrhage, of doubtful significance in isolation. No mass lesion, midline shift or mass effect. No hydrocephalus or extra-axial fluid collection. Pituitary gland suprasellar region normal. Midline structures intact and normal. Vascular: Major intracranial vascular flow voids are maintained. Skull and upper cervical spine: Craniocervical junction within normal limits. Bone marrow signal intensity normal. Well-circumscribed subcentimeter T1 hyperintense lesion noted at the right parietal  calvarium, nonspecific, but most likely benign and of doubtful significance. No scalp soft tissue abnormality. Sinuses/Orbits: Globes and orbital soft tissues within normal limits. Chronic right maxillary sinusitis noted. Paranasal sinuses are otherwise largely clear. No mastoid effusion. Inner  ear structures grossly normal. Other: None. IMPRESSION: 1. Negative brain MRI for age, with no acute intracranial abnormality identified. 2. Chronic right maxillary sinusitis. Electronically Signed   By: Rise Mu M.D.   On: 05/25/2020 03:29   MR CERVICAL SPINE WO CONTRAST  Addendum Date: 05/26/2020   ADDENDUM REPORT: 05/26/2020 19:25 ADDENDUM: These results were called by telephone at the time of interpretation on 05/26/2020 at 7:24 pm to provider R[place]ore , who verbally acknowledged these results. Electronically Signed   By: Marlan Palau M.D.   On: 05/26/2020 19:25   Result Date: 05/26/2020 CLINICAL DATA:  Cervical radiculopathy no red flags EXAM: MRI CERVICAL SPINE WITHOUT CONTRAST TECHNIQUE: Multiplanar, multisequence MR imaging of the cervical spine was performed. No intravenous contrast was administered. COMPARISON:  CT cervical spine 12/04/2018 FINDINGS: Alignment: Mild anterolisthesis C4-5.  2 mm anterolisthesis C7-T1. Vertebrae: Negative for fracture or mass. Cord: Cord compression at C7-T1 with cord hyperintensity which is ill-defined. This may be acute. Remainder of the cord signal is normal. Posterior Fossa, vertebral arteries, paraspinal tissues: Negative Disc levels: C2-3: Negative C3-4: Bilateral facet degeneration and mild disc degeneration. Mild foraminal stenosis bilaterally due to spurring. C4-5: Moderate facet degeneration bilaterally. Disc degeneration and spurring. Moderate foraminal encroachment bilaterally due to spurring. C5-6: Bilateral facet degeneration. Disc degeneration and diffuse uncinate spurring. Severe right foraminal encroachment and moderate to severe left foraminal  encroachment due to spurring. Mild spinal stenosis. C6-7: Disc degeneration with diffuse uncinate spurring. Bilateral facet hypertrophy. Mild spinal stenosis and moderate foraminal stenosis bilaterally. C7-T1: Large extruded disc fragment to the left of midline. There is cord compression and cord hyperintensity. There is bilateral facet hypertrophy and mild foraminal stenosis bilaterally. IMPRESSION: Multilevel degenerative change. Disc and facet degeneration and spurring causing foraminal encroachment at multiple levels as above. Large extruded disc fragment to the left of midline at C7-T1. There is cord compression and ill-defined cord hyperintensity which may be acute. Electronically Signed: By: Marlan Palau M.D. On: 05/26/2020 19:15   MR LUMBAR SPINE WO CONTRAST  Result Date: 05/26/2020 CLINICAL DATA:  Low back pain.  Progressive neurologic deficit. EXAM: MRI LUMBAR SPINE WITHOUT CONTRAST TECHNIQUE: Multiplanar, multisequence MR imaging of the lumbar spine was performed. No intravenous contrast was administered. COMPARISON:  None. FINDINGS: Segmentation:  Normal Alignment:  Mild anterolisthesis L3-4 L4-5 Vertebrae:  Normal bone marrow.  Negative for fracture or mass. Conus medullaris and cauda equina: Conus extends to the L2 level. Conus and cauda equina appear normal. Paraspinal and other soft tissues: Negative for paraspinous mass or adenopathy. No soft tissue edema or fluid collection. Diffuse muscular atrophy. Disc levels: L1-2: Broad-based central disc protrusion and mild facet degeneration. Negative for stenosis L2-3: Mild disc bulging and moderate facet hypertrophy bilaterally. Mild subarticular stenosis bilaterally L3-4: Disc degeneration with diffuse bulging of the disc. Moderate to advanced facet hypertrophy bilaterally. Mild spinal stenosis and mild subarticular stenosis bilaterally. L4-5: Small right foraminal disc protrusion displacing the right L4 nerve root. Severe facet degeneration.  Moderate spinal stenosis and moderate subarticular stenosis bilaterally. L5-S1: Small central disc protrusion and mild facet degeneration. Negative for spinal or foraminal stenosis. IMPRESSION: Multilevel degenerative change throughout the lumbar spine. Negative for lumbar fracture Mild spinal stenosis L3-4 with mild subarticular stenosis bilaterally. Moderate spinal stenosis at L4-5 with moderate subarticular stenosis bilaterally. Electronically Signed   By: Marlan Palau M.D.   On: 05/26/2020 18:58   VAS [place] LOWER EXTREMITY VENOUS (DVT)  Result Date: 06/03/2020  Lower Venous DVTStudy Indications: History of DVT.  Risk Factors: DVT. Limitations: Body habitus and poor ultrasound/tissue interface. Comparison Study: 05/30/2020 - R PeroV L PeroV Performing Technologist: Chanda Busing RVT  Examination Guidelines: A complete evaluation includes B-mode imaging, spectral Doppler, color Doppler, and power Doppler as needed of all accessible portions of each vessel. Bilateral testing is considered an integral part of a complete examination. Limited examinations for reoccurring indications may be performed as noted. The reflux portion of the exam is performed with the patient in reverse Trendelenburg.  +---------+---------------+---------+-----------+----------+-----------------+ RIGHT    CompressibilityPhasicitySpontaneityPropertiesThrombus Aging    +---------+---------------+---------+-----------+----------+-----------------+ CFV      Full           Yes      Yes                                    +---------+---------------+---------+-----------+----------+-----------------+ SFJ      Full                                                           +---------+---------------+---------+-----------+----------+-----------------+ FV Prox  Full                                                           +---------+---------------+---------+-----------+----------+-----------------+ FV Mid   Full                                                            +---------+---------------+---------+-----------+----------+-----------------+ FV DistalFull                                                           +---------+---------------+---------+-----------+----------+-----------------+ PFV      Full                                                           +---------+---------------+---------+-----------+----------+-----------------+ POP      Full           Yes      Yes                                    +---------+---------------+---------+-----------+----------+-----------------+ PTV      Full                                                           +---------+---------------+---------+-----------+----------+-----------------+  PERO     Partial                                      Age Indeterminate +---------+---------------+---------+-----------+----------+-----------------+   +---------+---------------+---------+-----------+----------+-----------------+ LEFT     CompressibilityPhasicitySpontaneityPropertiesThrombus Aging    +---------+---------------+---------+-----------+----------+-----------------+ CFV      Full           Yes      Yes                                    +---------+---------------+---------+-----------+----------+-----------------+ SFJ      Full                                                           +---------+---------------+---------+-----------+----------+-----------------+ FV Prox  Full                                                           +---------+---------------+---------+-----------+----------+-----------------+ FV Mid   Full                                                           +---------+---------------+---------+-----------+----------+-----------------+ FV DistalFull                                                            +---------+---------------+---------+-----------+----------+-----------------+ PFV      Full                                                           +---------+---------------+---------+-----------+----------+-----------------+ POP      Partial        Yes      Yes                  Age Indeterminate +---------+---------------+---------+-----------+----------+-----------------+ PTV      Full                                                           +---------+---------------+---------+-----------+----------+-----------------+ PERO     Partial                                      Age Indeterminate +---------+---------------+---------+-----------+----------+-----------------+     Summary: RIGHT: -  Findings consistent with age indeterminate deep vein thrombosis involving the right peroneal veins. - No cystic structure found in the popliteal fossa.  LEFT: - Findings consistent with age indeterminate deep vein thrombosis involving the left popliteal vein, and left peroneal veins. - No cystic structure found in the popliteal fossa.  *See table(s) above for measurements and observations. Electronically signed by Lemar Livings MD on 06/03/2020 at 8:17:57 PM.    Final    VAS [place] LOWER EXTREMITY VENOUS (DVT)  Result Date: 05/31/2020  Lower Venous DVTStudy Indications: Swelling.  Risk Factors: None identified. Limitations: Body habitus and poor ultrasound/tissue interface. Comparison Study: No prior studies. Performing Technologist: Chanda Busing RVT  Examination Guidelines: A complete evaluation includes B-mode imaging, spectral Doppler, color Doppler, and power Doppler as needed of all accessible portions of each vessel. Bilateral testing is considered an integral part of a complete examination. Limited examinations for reoccurring indications may be performed as noted. The reflux portion of the exam is performed with the patient in reverse Trendelenburg.   +---------+---------------+---------+-----------+----------+-----------------+ RIGHT    CompressibilityPhasicitySpontaneityPropertiesThrombus Aging    +---------+---------------+---------+-----------+----------+-----------------+ CFV      Full           Yes      Yes                                    +---------+---------------+---------+-----------+----------+-----------------+ SFJ      Full                                                           +---------+---------------+---------+-----------+----------+-----------------+ FV Prox  Full                                                           +---------+---------------+---------+-----------+----------+-----------------+ FV Mid   Full                                                           +---------+---------------+---------+-----------+----------+-----------------+ FV DistalFull                                                           +---------+---------------+---------+-----------+----------+-----------------+ PFV      Full                                                           +---------+---------------+---------+-----------+----------+-----------------+ POP      Full           Yes      Yes                                    +---------+---------------+---------+-----------+----------+-----------------+  PTV      Full                                                           +---------+---------------+---------+-----------+----------+-----------------+ PERO     Partial                                      Age Indeterminate +---------+---------------+---------+-----------+----------+-----------------+   +---------+---------------+---------+-----------+----------+-----------------+ LEFT     CompressibilityPhasicitySpontaneityPropertiesThrombus Aging    +---------+---------------+---------+-----------+----------+-----------------+ CFV      Full           Yes      Yes                                     +---------+---------------+---------+-----------+----------+-----------------+ SFJ      Full                                                           +---------+---------------+---------+-----------+----------+-----------------+ FV Prox  Full                                                           +---------+---------------+---------+-----------+----------+-----------------+ FV Mid   Full                                                           +---------+---------------+---------+-----------+----------+-----------------+ FV DistalFull                                                           +---------+---------------+---------+-----------+----------+-----------------+ PFV      Full                                                           +---------+---------------+---------+-----------+----------+-----------------+ POP      Full           Yes      Yes                                    +---------+---------------+---------+-----------+----------+-----------------+ PTV      Full                                                           +---------+---------------+---------+-----------+----------+-----------------+  PERO     None                                         Age Indeterminate +---------+---------------+---------+-----------+----------+-----------------+     Summary: RIGHT: - Findings consistent with age indeterminate deep vein thrombosis involving the right peroneal veins. - No cystic structure found in the popliteal fossa.  LEFT: - Findings consistent with age indeterminate deep vein thrombosis involving the left peroneal veins. - No cystic structure found in the popliteal fossa.  *See table(s) above for measurements and observations. Electronically signed by Sherald Hess MD on 05/31/2020 at 10:53:57 AM.    Final     Labs:  Basic Metabolic Panel: No results for input(s): NA, K, CL, CO2, GLUCOSE, BUN, CREATININE,  CALCIUM, MG, PHOS in the last 168 hours.  CBC: Recent Labs  Lab 06/07/20 0824  WBC 10.8*  HGB 11.6*  HCT 36.2  MCV 109.0*  PLT 324    CBG: No results for input(s): GLUCAP in the last 168 hours.  Family history.  Mother with CAD CVA and breast cancer as well as hypertension.  Father with COPD diabetes mellitus hypertension.  Maternal grandmother with CVA.  Denies any colon cancer rectal cancer or esophageal cancer  Brief HPI:   [name] is a 74 y.o. right-handed female retired Engineer, civil (consulting) with history of hypertension diastolic congestive heart failure, hypothyroidism, history of alcohol use upper GI bleed and obesity with BMI 36.87 with gastric bypass.  Per chart review lives with spouse.  Used a cane outside the home.  Husband does assist with some ADLs.  Presented 05/24/2020 with left leg weakness.  Denies any recent fall.  No bowel or bladder disturbances.  Admission chemistries unremarkable urinalysis negative nitrite.  CT/MRI of the brain negative for acute changes.  MRI of the spine showed multilevel degenerative changes throughout the lumbar spine negative for fracture.  Mild spinal stenosis L3-4 with mild subarticular stenosis bilaterally.  Moderate spinal stenosis L4-5.  Cervical MRI demonstrated a large herniated disc at C7-T1.  Patient underwent C7-T1 anterior cervical discectomy decompression interbody arthrodesis with insertion of interbody prosthesis 05/27/2020 per Dr. Lovell Sheehan.  Decadron protocol as indicated.  Placed in a cervical collar.  Patient was admitted for a comprehensive rehab program   Hospital Course: Lennis Rader was admitted to rehab 05/29/2020 for inpatient therapies to consist of PT, ST and OT at least three hours five days a week. Past admission physiatrist, therapy team and rehab RN have worked together to provide customized collaborative inpatient rehab.  Pertaining to patient's large herniated disc at C7-T1 cord compression she had undergone anterior  cervical discectomy decompression 05/27/2020.  Patient would follow-up neurosurgery.  Hospital course vascular studies positive for intermediate bilateral peroneal DVT repeat study showed new popliteal DVT she was cleared to begin Xarelto no bleeding episodes.  Pain managed with use of scheduled Neurontin Flexeril as needed as well as oxycodone with tramadol.  Scheduled meloxicam at patient's request.  Added prednisone 10 mg 3 times daily for right arm neuropathic pain to help decrease nerve root compression.  Mood stabilization with Xanax as well as follow-up per neuropsychology.  Blood pressure controlled on Lotensin hydrochlorothiazide and Coreg and patient was instructed to follow-up with PCP.  Hormone supplement ongoing for hypothyroidism.  Crestor for hyperlipidemia.  She did have some macrocytosis without anemia MCV of 110 on 05/31/2020 vitamin B12 folate within normal  limits.  History of alcohol use again patient received counts regards to cessation of alcohol.  Bouts of constipation question neurogenic bowel laxative assistance bowel meds adjusted 06/06/2020.  Neurogenic possible bladder discussed decreased awareness of filling and how pertains to the spinal cord injury.   Blood pressures were monitored on TID basis and controlled   Rehab course: During patient's stay in rehab weekly team conferences were held to monitor patient's progress, set goals and discuss barriers to discharge. At admission, patient required moderate assist to ambulate 2 feet rolling walker minimal assist stand pivot transfers minimal assist supine to sit.  Set up upper body bathing mod assist lower body bathing minimal assistance upper body dressing max is lower body dressing moderate assist toilet transfers  Physical exam.  Blood pressure 134/73 pulse 80 temperature 98 respirations 18 oxygen saturation 97% room air Neurological alert oriented follows commands HEENT Head.  Normocephalic and atraumatic Eyes.  Pupils round and  reactive to light no discharge.nystagmus Neck.  Supple nontender no JVD without thyromegaly Cardiac regular rate rhythm without any extra sounds or murmur heard Respiratory effort normal no respiratory distress without wheeze Abdomen.  Soft nontender positive bowel sounds without rebound Skin.  No evidence of breakdown or rash Neurologic.  Cranial nerves II through XII intact motor strength 5/5 bilateral deltoids biceps triceps grip, 4 -/5 right and 3 -/5 left hip flexor, knee extensor ankle dorsiflexion plantarflexion.  Sensation exam normal sensation to light touch and proprioception in bilateral upper lower extremities   She  has had improvement in activity tolerance, balance, postural control as well as ability to compensate for deficits. Francis Dowse has had improvement in functional use RUE/LUE  and RLE/LLE as well as improvement in awareness.  Patient performs bed mobility with close supervision and transfers with contact-guard assist and rolling walker.  Ambulates 3 x 50 feet rolling walker contact-guard assist and up and down stairs using bilateral rails with minimal assistance.  Patient currently completes bathing dressing with sit to stand from wheelchair at sink.  Requires minimal assist for lower body bathing dressing.  Functional transfers and functional ambulation rolling walker contact-guard assist and close supervision.  Full family teaching was completed and plan discharge to home       Disposition: Discharged to home    Diet: Regular consistency  Special Instructions: No driving smoking or alcohol  Medications at discharge 1.  Tylenol as needed 2.  Xanax 0.5 mg as needed 3.  Lotensin/HCT 20-25 mg p.o. daily 4.  Coreg 12.5 mg p.o. twice daily 5.  Flexeril 10 mg 3 times daily as needed 6.  Colace 200 mg p.o. twice daily 7.  Ferrous sulfate 325 mg p.o. daily 8.  Folic acid 1 mg p.o. daily 9.  Neurontin 1200 mg nightly and 1500 mg daily 10.  Synthroid 125 mcg once per day  Tuesday Friday and 150 mcg Sunday Monday Wednesday Thursday Saturday 11.  Mobic 15 mg p.o. daily 12.  Oxycodone 10 mg every 3 hours as needed pain 13.  Protonix 40 mg p.o. daily 14.  Prednisone 10 mg 3 times daily and taper 15.  Xarelto 15 mg twice daily until 06/23/2020 then begin 20 mg daily 16.  Crestor 5 mg p.o. daily  30-35 minutes were spent completing discharge summary and discharge planning  Discharge Instructions    Ambulatory referral to Physical Medicine Rehab   Complete by: As directed    Moderate complexity follow-up 1 to 2 weeks C7-T1 cord compression  Follow-up Information    Tressie StalkerJenkins, Jeffrey, MD Follow up.   Specialty: Neurosurgery Why: Call for appointment Contact information: [address] [place] [address] [phone]        Genice RougeLovorn, Megan, MD Follow up.   Specialty: Physical Medicine and Rehabilitation Why: Office to call for appointment Contact information: [address] [place] [phone]               Signed: Mcarthur RossettiDaniel J Keiera [name] 06/10/2020, 7:03 AM

## 2020-06-08 NOTE — Progress Notes (Signed)
Occupational Therapy Session Note  Patient Details  Name: [name] MRN: [id number] Date of Birth: [birth year]  Today's Date: 06/08/2020 OT Individual Time: 6378-5885 OT Individual Time Calculation (min): 60 min    Short Term Goals: Week 2:  OT Short Term Goal 1 (Week 2): STG=LTG secondary to ELOS  Skilled Therapeutic Interventions/Progress Updates:    Pt sitting up in bed with husband in room.  Pt requesting to practice tub shower transfer using shower bench because her and husband discussed it will be easier to complete at home in bathroom with tub.  Pt transported to ADL suite and provided visual demo of transfer technique.  Pt return demonstrated x 2 both in and out of tub with husband at side needing min assist for LLE support and close CGA for balance.  Discussed installation of grab bars to increase safety during sit<>stand for bathing buttocks.   Educated pt and husband regarding environmental modifications in kitchen to increase safety as well as compensatory strategies to increase pt independence.  OT provided educational handout regarding kitchen safety at [facility] for improved carryover of strategies.  Pt and husband eager to learn throughout session with good understanding reported and demonstrated.  Pt placed baking pan in and out of oven on top rack while following spinal precautions.  Pt transported pan from stove to sink to simulate safe transport of water at home.  Pt returned to room and requesting back to bed.  Provided step by step VCs for safe log rolling technique sit to supine and pt needing min assist for LLE support.  Call bell in reach, bed alarm on.  Therapy Documentation Precautions:  Precautions Precautions: Fall, Cervical Precaution Comments: Verbally reviewed cervical precautions.  Restrictions Weight Bearing Restrictions: No   Therapy/Group: Individual Therapy  Amie Critchley 06/08/2020, 5:52 PM

## 2020-06-08 NOTE — Progress Notes (Signed)
Occupational Therapy Session Note  Patient Details  Name: [name] MRN: [id number] Date of Birth: July 27, 1946  Today's Date: 06/08/2020 OT Individual Time: 1000-1100 OT Individual Time Calculation (min): 60 min    Short Term Goals: Week 2:  OT Short Term Goal 1 (Week 2): STG=LTG secondary to ELOS  Skilled Therapeutic Interventions/Progress Updates:    Pt resting in bed upon arrival.  Pt declined bathing this morning and had already donned clothing.  OT intervention with focus on bed mobility, functional amb with RW, standing balance, BUE therex, discharge planning, and activity tolerance to increase independence with BADLs. Supine>sit EOB with supervision and HOB elevated. Pt amb with RW to w/c at sink to complete grooming tasks before leaving room for gym. Pt transitioned to gym and engaged in standing tasks on compliant and noncompliant surfaces at [employer].  [employer] activity on noncompliant surface with supervision.  Activity on Air[employer] foam pad with min A. Pt also engaged in BUE therex on [employer] arm bike (12 mins random level 3). Pt returned to room and remained in w/c with all needs within reach and bed alarm activated. Continued discharge planning. Pt pleased with progress and ready for discharge home on 7/15.  Therapy Documentation Precautions:  Precautions Precautions: Fall, Cervical Precaution Comments: Verbally reviewed cervical precautions.  Restrictions Weight Bearing Restrictions: No  Pain: Pt stated her pain was "ok" since she has had her meds  Therapy/Group: Individual Therapy  Rich Brave 06/08/2020, 12:30 PM

## 2020-06-08 NOTE — Progress Notes (Signed)
Occupational Therapy Discharge Summary  Patient Details  Name: [name] MRN: [id number] Date of Birth: June 16, 1946  Patient has met 7 of 10  long term goals due to improved activity tolerance, improved balance, ability to compensate for deficits and functional use of  RIGHT upper and LEFT upper extremity. Pt made excellent progress with BADLs and functional transfers/ambulation during this admission.  Pt is mod I/supervision for bathing/dressing and toileting tasks.  Pt's husband has been present for therapy sessions.  Patient to discharge at overall Supervision level.  Patient's care partner is independent to provide the necessary physical and cognitive assistance at discharge.    Reasons goals not met: Pt requires some assist to complete tub transfer due to LLE weakness and stiffness.  Pt requires assist to donn compression socks and left shoe as well, however pts husband able to assist as needed.  Recommendation:  Patient will benefit from ongoing skilled OT services in home health setting to continue to advance functional skills in the area of BADL and IADL.  Equipment: Recommended shower bench; pt purchasing elsewhere  Reasons for discharge: treatment goals met and discharge from hospital   Skilled Intervention: Pt toileting with husband present upon OT arrival.  Pt completed toileting with setup, toilet transfer with supervision.  Pt washed hands standing sinkside with setup.  Pt sat EOB and donned shoes using reacher with mod I right shoe and min assist left shoe.  Pt transferred to w/c with supervision and transported to ADL bathroom.  Pt completed tub bench transfer with husband providing min assist for LLE support.  Pt required less guarding today during transfer.  Pts husband measured height of grab bars as a guide for installation at home.  Pt completed sit<>stand transfer with RW from low height couch to simulate home setup.  Pt able to complete with CGA.  Pt returned to  room and completed SPT w/c to EOB and sit to supine with supervision.  Call bell in reach, bed alarm on.  Patient/family agrees with progress made and goals achieved: Yes  OT Discharge ADL Equipment Provided: Reacher, Long-handled sponge Eating: Independent Where Assessed-Eating: Chair Grooming: Setup Where Assessed-Grooming: Standing at sink Upper Body Bathing: Supervision/safety Where Assessed-Upper Body Bathing: Shower Lower Body Bathing: Supervision/safety Where Assessed-Lower Body Bathing: Shower Upper Body Dressing: Setup Where Assessed-Upper Body Dressing: Edge of bed Lower Body Dressing: Minimal assistance Where Assessed-Lower Body Dressing: Edge of bed Toileting: Setup Where Assessed-Toileting: Glass blower/designer: Close supervision Toilet Transfer Method: Ambulating Tub/Shower Transfer: Minimal assistance Tub/Shower Transfer Method: Optometrist: Radio broadcast assistant Vision Baseline Vision/History: Wears glasses Wears Glasses: At all times Patient Visual Report: No change from baseline Vision Assessment?: No apparent visual deficits Perception  Perception: Within Functional Limits Praxis Praxis: Intact Cognition Overall Cognitive Status: Within Functional Limits for tasks assessed Arousal/Alertness: Awake/alert Orientation Level: Oriented X4 Attention: Selective;Sustained;Focused Focused Attention: Appears intact Sustained Attention: Appears intact Selective Attention: Appears intact Memory: Appears intact Awareness: Appears intact Problem Solving: Appears intact Safety/Judgment: Appears intact Sensation Sensation Light Touch: Impaired by gross assessment Hot/Cold: Appears Intact Proprioception: Impaired by gross assessment Stereognosis: Not tested Coordination Gross Motor Movements are Fluid and Coordinated: No Fine Motor Movements are Fluid and Coordinated: Yes Motor  Motor Motor: Abnormal postural alignment and control Motor  - Skilled Clinical Observations: LLE weakness > RLE       Trunk/Postural Assessment  Cervical Assessment Cervical Assessment:  (cervical precautions, hard cervical collar) Thoracic Assessment Thoracic Assessment:  (rounded shoulders) Lumbar Assessment Lumbar Assessment:  (  posterior pelvic tilt) Postural Control Postural Control: Within Functional Limits  Balance Static Sitting Balance Static Sitting - Level of Assistance: 6: Modified independent (Device/Increase time) Dynamic Sitting Balance Dynamic Sitting - Level of Assistance: 6: Modified independent (Device/Increase time) Extremity/Trunk Assessment RUE Assessment RUE Assessment: Within Functional Limits LUE Assessment LUE Assessment: Within Functional Limits   Leroy Libman 06/08/2020, 3:07 PM

## 2020-06-08 NOTE — Progress Notes (Signed)
Physical Therapy Session Note  Patient Details  Name: [name] MRN: [id number] Date of Birth: 1946-01-28  Today's Date: 06/08/2020 PT Individual Time: 1353-1452 PT Individual Time Calculation (min): 59 min   Short Term Goals: Week 2:  PT Short Term Goal 1 (Week 2): STG = LTG d/t ELOS  Skilled Therapeutic Interventions/Progress Updates:    Pt received long sitting in bed with HOB maximally elevated and her husband present. Pt agreeable to therapy session and wearing hard cervical collar for entire session. Transfer to EOB with supervision. Sit<>stands using RW with CGA/close supervision for safety during session. Stand pivot to w/c using RW with CGA for safety.  Transported to/from gym in w/c for time management and energy conservation. Gait training 36ft, 19ft using RW with CGA for safety but no instability noted - ambulates with decreased gait speed and pt being cautious due to fear of falling and reporting she doesn't feel her LEs are completely strong yet (especially L LE) and demos increased B UE weight bearing through RW. Pt reports her husband has created a 3&1/2 inch high wooden step to place in front of their 8" height brick step to allow her increased independence with the 3 stair navigation for home entry using RW for UE support as they have no handrails and can only install grab bar type handles. Therapist set-up a 5" step followed by 4" step for pt to practice navigating with RW - education on sequencing and AD management - pt ascended/descended forward using RW with CGA for steadying (no assist for AD management needed) - cuing for leading with R LE on ascent and L LE on descent. Ascended/descended 8 steps (6"height) using B HRs with CGA - step-to pattern - cuing for increased L knee flexion on ascent to improve foot clearance onto step. Therapist provided pt with walker bag and reacher bag to allow pt to transport items when ambulating with RW for increased safety/decreased  fall risk. Gait training ~73ft using RW with CGA for safety but no instability noted and +2 providing w/c follow to allow pt increased gait distance. Transported remainder of distance back to room. Stand pivot w/c>EOB using RW with CGA/close supervision for safety. Pivoted to long sitting on bed with supervision. Left with needs in reach, bed alarm on, and her husband present.  Therapy Documentation Precautions:  Precautions Precautions: Fall, Cervical Precaution Comments: Verbally reviewed cervical precautions.  Restrictions Weight Bearing Restrictions: No   Pain: No reports of pain throughout session.   Therapy/Group: Individual Therapy  Ginny Forth , PT, DPT, CSRS  06/08/2020, 12:31 PM

## 2020-06-08 NOTE — Patient Care Conference (Signed)
Inpatient RehabilitationTeam Conference and Plan of Care Update Date: 06/08/2020   Time: 3:37 PM    Patient Name: [name]      Medical Record Number: [id number]  Date of Birth: 1946/08/02 Sex: Female         Room/Bed: 4M10C/4M10C-01 Payor Info: Payor: [employer] MEDICARE / Plan: [employer] MEDICARE CHOICE PPO / Product Type: *No Product type* /    Admit Date/Time:  05/29/2020  3:06 PM  Primary Diagnosis:  HNP (herniated nucleus pulposus) with myelopathy, cervical  Hospital Problems: Principal Problem:   HNP (herniated nucleus pulposus) with myelopathy, cervical Active Problems:   Spinal cord compression (HCC)   Hypoalbuminemia due to protein-calorie malnutrition (HCC)   Macrocytosis   Postoperative pain   Drug induced constipation    Expected Discharge Date: Expected Discharge Date: 06/10/20  Team Members Present: Physician leading conference: Dr. Genice Rouge Care Coodinator Present: Cecile Sheerer, LCSWA;Tameron Lama Marlyne Beards, RN, BSN, CRRN Nurse Present: Otilio Carpen, RN PT Present: Peter Congo, PT OT Present: Ardis Rowan, COTA;Jennifer Smith, OT SLP Present: Feliberto Gottron, SLP PPS Coordinator present : Edson Snowball, Park Breed, SLP     Current Status/Progress Goal Weekly Team Focus  Bowel/Bladder   pt cont of bwoel and bladder  remain cont of bowel and bladder  assess q shift and orn    Swallow/Nutrition/ Hydration             ADL's   bathing-supervsion; dressing-supervision; functional transfers-supervision; toileting-supervision  supervision/mod I overall  discharge planning, standing balance, functional amb with RW, safety awareness, activity tolerance   Mobility   min A bed mobility, CGA transfers, CGA gait up to 50 ft with RW, mod A 6" stairs with 2 handrails  Supervision to mod I  endurnace, LLE strengtening, gait training   Communication             Safety/Cognition/ Behavioral Observations            Pain   pt reports pain 7/10   decrease  pain  assess pain q shift and prn, administer medication as needed    Skin   surgical incision to neck, clean dry and intact  remain free from infection and break down  assess skin q shioft and prn      Team Discussion:  Discharge Planning/Teaching Needs:  D/c to home with support from husband. PRN support from friends.  Family education as recommended by therapy   Current Update:  NONE  Current Barriers to Discharge:  Medication compliance and BLE pain  Possible Resolutions to Barriers: Continue to educate the importance of bowel medications, and added muscle relaxer with pain medication.  Patient on target to meet rehab goals: yes, continent B/B, min-mod assist with stairs, contact guard with ambulating. Making good progress with ADL's. Husband to participate with therapy before discharge. Husband to provide 24/7 care.  *See Care Plan and progress notes for long and short-term goals.   Revisions to Treatment Plan:  none    Medical Summary Current Status: some pain in LEs- tingling- continent of B/B? Weekly Focus/Goal: PT- CGA overall- walking 50 ft- increasing daily- has RW- min-mod A with stairs- d/c 7/15  Barriers to Discharge: Decreased family/caregiver support;Home enviroment access/layout;Neurogenic Bowel & Bladder;Medication compliance  Barriers to Discharge Comments: nutrition- bad albumin level; efusing a lot of meds- stopped colace Possible Resolutions to Barriers: OT- Goals S-Mod I- at S level now- transfers CGA- S; had shower yesterday   Continued Need for Acute Rehabilitation Level of Care: The patient requires  daily medical management by a physician with specialized training in physical medicine and rehabilitation for the following reasons: Direction of a multidisciplinary physical rehabilitation program to maximize functional independence : Yes Medical management of patient stability for increased activity during participation in an intensive rehabilitation  regime.: Yes Analysis of laboratory values and/or radiology reports with any subsequent need for medication adjustment and/or medical intervention. : Yes   I attest that I was present, lead the team conference, and concur with the assessment and plan of the team.   Tennis Must 06/08/2020, 3:37 PM

## 2020-06-09 ENCOUNTER — Inpatient Hospital Stay (HOSPITAL_COMMUNITY): Payer: Medicare PPO

## 2020-06-09 ENCOUNTER — Inpatient Hospital Stay (HOSPITAL_COMMUNITY): Payer: Medicare PPO | Admitting: Occupational Therapy

## 2020-06-09 ENCOUNTER — Inpatient Hospital Stay (HOSPITAL_COMMUNITY): Payer: Medicare PPO | Admitting: Physical Therapy

## 2020-06-09 MED ORDER — ACETAMINOPHEN 325 MG PO TABS: 650.0000 mg | ORAL_TABLET | ORAL | Status: DC | PRN

## 2020-06-09 MED ORDER — OXYCODONE HCL 10 MG PO TABS: 10.0000 mg | ORAL_TABLET | ORAL | 0 refills | Status: DC | PRN

## 2020-06-09 MED ORDER — PREDNISONE 10 MG PO TABS: 10.0000 mg | ORAL_TABLET | Freq: Three times a day (TID) | ORAL | 0 refills | Status: DC

## 2020-06-09 MED ORDER — RIVAROXABAN 20 MG PO TABS: 20.0000 mg | ORAL_TABLET | Freq: Every day | ORAL | 1 refills | Status: DC

## 2020-06-09 MED ORDER — CARVEDILOL 12.5 MG PO TABS
12.5000 mg | ORAL_TABLET | Freq: Two times a day (BID) | ORAL | Status: DC
  Administered 2020-06-10: 12.5 mg via ORAL
  Filled 2020-06-09: qty 1

## 2020-06-09 MED ORDER — PANTOPRAZOLE SODIUM 40 MG PO TBEC: 40.0000 mg | DELAYED_RELEASE_TABLET | Freq: Every day | ORAL | 0 refills | Status: DC

## 2020-06-09 MED ORDER — MELOXICAM 15 MG PO TABS: 15.0000 mg | ORAL_TABLET | Freq: Every day | ORAL | 1 refills | Status: DC

## 2020-06-09 MED ORDER — CYCLOBENZAPRINE HCL 10 MG PO TABS: 10.0000 mg | ORAL_TABLET | Freq: Every day | ORAL | 0 refills | Status: DC | PRN

## 2020-06-09 MED ORDER — FERROUS SULFATE 325 (65 FE) MG PO TABS: 325.0000 mg | ORAL_TABLET | Freq: Every day | ORAL | 3 refills | Status: AC

## 2020-06-09 MED ORDER — FOLIC ACID 1 MG PO TABS: 1.0000 mg | ORAL_TABLET | Freq: Every day | ORAL | 0 refills | Status: DC

## 2020-06-09 MED ORDER — BISACODYL 10 MG RE SUPP: 10.0000 mg | Freq: Every day | RECTAL | 0 refills | Status: DC | PRN

## 2020-06-09 MED ORDER — ROSUVASTATIN CALCIUM 5 MG PO TABS: 5.0000 mg | ORAL_TABLET | Freq: Every day | ORAL | 1 refills | Status: DC

## 2020-06-09 MED ORDER — ALPRAZOLAM 0.5 MG PO TABS: 0.5000 mg | ORAL_TABLET | Freq: Every day | ORAL | 0 refills | Status: DC | PRN

## 2020-06-09 MED ORDER — PREDNISONE 10 MG PO TABS
10.0000 mg | ORAL_TABLET | Freq: Every day | ORAL | Status: DC
  Administered 2020-06-10: 10 mg via ORAL
  Filled 2020-06-09: qty 1

## 2020-06-09 MED ORDER — VITAMIN B 12 100 MCG PO LOZG
2500.0000 mg | LOZENGE | ORAL | 0 refills | Status: DC
Start: 2020-06-09 — End: 2020-12-02

## 2020-06-09 MED ORDER — VITAMIN D3 125 MCG (5000 UT) PO TABS: 15000.0000 [IU] | ORAL_TABLET | ORAL | 0 refills | Status: AC

## 2020-06-09 MED ORDER — CARVEDILOL 6.25 MG PO TABS: ORAL_TABLET | ORAL | 3 refills | Status: DC

## 2020-06-09 MED ORDER — PREDNISONE 10 MG PO TABS
10.0000 mg | ORAL_TABLET | Freq: Two times a day (BID) | ORAL | Status: DC
  Administered 2020-06-09: 10 mg via ORAL
  Filled 2020-06-09: qty 1

## 2020-06-09 MED ORDER — RIVAROXABAN 15 MG PO TABS: 15.0000 mg | ORAL_TABLET | Freq: Two times a day (BID) | ORAL | 0 refills | Status: DC

## 2020-06-09 MED FILL — ALPRAZolam 0.5 MG TABS: 0.5 | 10 days supply | Qty: 10 | Fill #0

## 2020-06-09 MED FILL — VITAMIN B-12 1000 MCG TABS: 1000 | 30 days supply | Qty: 30 | Fill #0

## 2020-06-09 MED FILL — FOLIC ACID 1 MG TABS: 1 | 30 days supply | Qty: 30 | Fill #0

## 2020-06-09 MED FILL — FERROUS SULFATE 325 MG TAB: 325 (65 FE) | 30 days supply | Qty: 30 | Fill #0

## 2020-06-09 MED FILL — XARELTO STARTER PACK: 15 & 20 | 30 days supply | Qty: 51 | Fill #0

## 2020-06-09 MED FILL — VITAMIN D3 5,000 UNIT TAB: 125 MCG | 30 days supply | Qty: 100 | Fill #0

## 2020-06-09 MED FILL — CYCLOBENZAPRINE 10 MG TAB: 10 | 90 days supply | Qty: 90 | Fill #0

## 2020-06-09 MED FILL — predniSONE 10 MG TABS: 10 | 7 days supply | Qty: 21 | Fill #0

## 2020-06-09 MED FILL — oxyCODONE HCL 10 MG TABS: 10 | 7 days supply | Qty: 30 | Fill #0

## 2020-06-09 NOTE — Progress Notes (Addendum)
[name] PHYSICAL MEDICINE & REHABILITATION PROGRESS NOTE   Subjective/Complaints: [name] feels shoulders are feeling better with therapy exercises. She finds that the steroids have been helpful for her pain. Discussed steroid taper. Decreased to prednisone 10mg  daily.  ROS: Pt denies SOB, abd pain, CP, N/V/C/D, and vision changes  Objective:   No results found. Recent Labs    06/07/20 0824  WBC 10.8*  HGB 11.6*  HCT 36.2  PLT 324   No results for input(s): NA, K, CL, CO2, GLUCOSE, BUN, CREATININE, CALCIUM in the last 72 hours.  Intake/Output Summary (Last 24 hours) at 06/09/2020 1937 Last data filed at 06/09/2020 1845 Gross per 24 hour  Intake 956 ml  Output --  Net 956 ml     Physical Exam: Vital Signs Blood pressure (!) 165/81, pulse 65, temperature (!) 97.5 F (36.4 C), resp. rate 18, height 5\' 5"  (1.651 m), weight 105.4 kg, SpO2 98 %. General: Alert and oriented x 3, No apparent distress HEENT: Head is normocephalic, atraumatic, PERRLA, EOMI, sclera anicteric, oral mucosa pink and moist, dentition intact, ext ear canals clear,  Neck: Supple without JVD or lymphadenopathy Heart: Reg rate and rhythm. No murmurs rubs or gallops Chest: CTA bilaterally without wheezes, rales, or rhonchi; no distress Abdomen: Soft, non-tender, non-distended, bowel sounds positive. Extremities: No clubbing, cyanosis, or edema. Pulses are 2+ Skin: Neck incision C/D/I- anterior neck incision Psych: appropriate but vague Musc: No edema in extremities.  No tenderness in extremities. Neuro: Alert Motor: Grossly 4-4+/5 throughout   Assessment/Plan: 1. Functional deficits secondary to C7-T1 cord compression with myelopathy , s/p decompression  which require 3+ hours per day of interdisciplinary therapy in a comprehensive inpatient rehab setting.  Physiatrist is providing close team supervision and 24 hour management of active medical problems listed below.  Physiatrist and rehab  team continue to assess barriers to discharge/monitor patient progress toward functional and medical goals  Care Tool:  Bathing    Body parts bathed by patient: Right arm, Left arm, Chest, Abdomen, Front perineal area, Right upper leg, Left upper leg, Face, Buttocks, Left lower leg, Right lower leg   Body parts bathed by helper: Right lower leg, Left lower leg     Bathing assist Assist Level: Contact Guard/Touching assist     Upper Body Dressing/Undressing Upper body dressing   What is the patient wearing?: Pull over shirt, Orthosis Orthosis activity level: Performed by helper  Upper body assist Assist Level: Minimal Assistance - Patient > 75% Assistive Device Comment: hard cervical collar  Lower Body Dressing/Undressing Lower body dressing      What is the patient wearing?: Underwear/pull up, Pants     Lower body assist Assist for lower body dressing: Supervision/Verbal cueing     Toileting Toileting    Toileting assist Assist for toileting: Supervision/Verbal cueing     Transfers Chair/bed transfer  Transfers assist  Chair/bed transfer activity did not occur: N/A  Chair/bed transfer assist level: Contact Guard/Touching assist Chair/bed transfer assistive device: 06/11/2020   Ambulation assist   Ambulation activity did not occur: N/A  Assist level: Contact Guard/Touching assist Assistive device: Walker-rolling Max distance: 65ft   Walk 10 feet activity   Assist  Walk 10 feet activity did not occur: N/A  Assist level: Contact Guard/Touching assist Assistive device: Walker-rolling   Walk 50 feet activity   Assist Walk 50 feet with 2 turns activity did not occur: N/A  Assist level: Contact Guard/Touching assist Assistive device: Walker-rolling  Walk 150 feet activity   Assist Walk 150 feet activity did not occur: Safety/medical concerns         Walk 10 feet on uneven surface  activity   Assist Walk 10 feet on  uneven surfaces activity did not occur: Safety/medical concerns         Wheelchair     Assist Will patient use wheelchair at discharge?: Yes Type of Wheelchair: Manual Wheelchair activity did not occur: Safety/medical concerns  Wheelchair assist level: Dependent - Patient 0%      Wheelchair 50 feet with 2 turns activity    Assist    Wheelchair 50 feet with 2 turns activity did not occur: Safety/medical concerns   Assist Level: Dependent - Patient 0%   Wheelchair 150 feet activity     Assist  Wheelchair 150 feet activity did not occur: Safety/medical concerns   Assist Level: Dependent - Patient 0%   Blood pressure (!) 165/81, pulse 65, temperature (!) 97.5 F (36.4 C), resp. rate 18, height 5\' 5"  (1.651 m), weight 105.4 kg, SpO2 98 %.  Medical Problem List and Plan: 1. Weaknesssecondary to large herniated disc at C7-T1/cord compression. Status post anterior cervical discectomy with decompression 05/27/2020. Cervical collar as directed  Continue CIR  2. Antithrombotics: -DVT/anticoagulation:Xarelto. Vascular study is positive for indeterminate bilateral peroneal DVT.  Repeat on 7/9 shows new popliteal DVT and stable prior.  -antiplatelet therapy: N/A 3. Pain Management:Neurontin 1200 mg nightly and 1500 mg daily, Flexeril 10 mg 3 times daily as needed, oxycodone and tramadol as needed  Some C8 radicular pain as expected   Scheduled Meloxicam as per patient's request.   Added Prednisone 10mg  TID for patient's right arm neuropathic pain to help decreased nerve root compression.   Diclofenac gel d/ced as pt on Mobic.  7/14: pain is well controlled. Decrease prednisone to 10mg  daily. 4. Mood:Xanax as needed -antipsychotic agents: N/A 5. Neuropsych: This patientiscapable of making decisions on herown behalf. 6. Skin/Wound Care:Routine skin checks 7. Fluids/Electrolytes/Nutrition:Routine in and outs 8. Hypertension. Lotensin  20 mg daily, HCTZ 25 mg daily, Coreg 6.25 mg twice daily. Monitor with increased mobility Vitals:   06/09/20 1402 06/09/20 1908  BP: (!) 159/77 (!) 165/81  Pulse: 68 65  Resp: 17 18  Temp:    SpO2: 96% 98%   D/ced HCTZ to 6.25mg .   Coreg increased back to 6.25 on 7/10  Elevated on 7/11, will not make any further changes today given recent changes, however will continue to monitor and consider further adjustments as necessary  7/12- BP in 150s/70s- due to SCI, can have orthostatic hypotension- pt is aware- suggested waiting a few days to get to make additional changes- would rather be a little high than too low.   7/13- BP 140s-150s systolic- will give 1 more day before increasing meds  7/14: Increase Coreg to 12.5mg  BID. 9. Hypothyroidism. Synthroid 10. Hyperlipidemia. Crestor 11. Macrocytosis without anemia. 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 12. History of alcohol use. Provide counseling 13.  Drug-inducedconstipation vs neurogenic bowel. Laxative assistance  Bowel meds increased on 7/11  7/13- refusing colace- will stop 14.  Hypoalbuminemia  Supplement initiated 7/10 15. Bladder- neurogenic  7/12- discussed decreased awareness of filling and how pertains to SCI 16. Intimacy/sexuality  7/12- affected in SCI- discussed at length how this is important to address in SCI- and how to address at  home once leaves CIR.  7/14: DC home with husband tomorrow.   >35 minutes spent in physical examination of patient, discussion of pain, review of blood pressure and increase of Coreg, discussion of steroid taper, discussion of home support.    LOS: 11 days A FACE TO FACE EVALUATION WAS PERFORMED  Horton Chin 06/09/2020, 7:37 PM

## 2020-06-09 NOTE — Progress Notes (Signed)
Physical Therapy Discharge Summary  Patient Details  Name: [name] MRN: [id number] Date of Birth: November 29, 1945  Today's Date: 06/09/2020 PT Individual Time: 1300-1345 PT Individual Time Calculation (min): 45 min   Patient has met 1 of 7 long term goals due to improved activity tolerance, improved balance, improved postural control, increased strength, increased range of motion, decreased pain and improved coordination.  Patient to discharge at an ambulatory level CGA with RW.   Patient's care partner is independent to provide the necessary physical assistance at discharge. Family education completed with husband.  Pt is supervision for bed mobility and CGA transfers. Pt ambulated 90 feet with RW and CGA. Pt ascended/descended 8 x 6 inch stairs with B/L handrails and CGA with a step-to pattern.  Reasons goals not met: Pt had a short length of stay and reached a level of independence that she and her husband are both comfortable with for discharge home.  Pt will maintain dynamic standing balance during mobility independently with assistive device -> pt is supervision with RW  Pt will perform bed mobility independently -> pt is supervision  Pt will perform bed to chair transfers independently with assistive device -> pt is CGA with RW  Pt will perform car transfers with supervision -> pt is min A with RW  Pt will ambulate 100 feet in controlled environment with supervision -> pt ambulated 90 feet with RW and CGA  Pt will ambulate 50 feet in home environment independently with assistive device -> pt ambulated 90 feet with RW and CGA  Recommendation:  Patient will benefit from ongoing skilled PT services in home health setting to continue to advance safe functional mobility, address ongoing impairments in strength, balance, endurance, coordination, safety awareness, and minimize fall risk.  Equipment: No equipment provided. Pt has RW, rollator, transport chair, and BSC at  home.  Reasons for discharge: treatment goals met and discharge from hospital  Patient/family agrees with progress made and goals achieved: Yes  Skilled Therapeutic Interventions/Progress Updates:  Pt received seated in recliner in room with [facility] in place. Husband present for family education. Agreeable to therapy. Denies any pain. Pt performed sit <> stand with RW and CGA throughout session. Ambulated 75 feet to therapy gym with RW and CGA. Pt demonstrates L hip flexor weakness with circumduction of LLE and difficulty clearing L foot. Reviewed proper guarding with husband. Ascended/descended a total of 6 x 3 inch stairs using R handrail (to simulate work bench pt will use at home as a rail) and LUE resting on therapist's arm (pt does not have L handrail at home) with min A and a step-to pattern. Reviewed proper guarding with husband. Performed car transfer at low seat height to simulate car pt will go home to x2 reps with RW and min A. Verbal cues required for hand placement. Dependent W/C transfer to room for time conservation. Pt left seated in W/C with all needs in reach. Spouse return demonstrated all functional activities throughout session. Pt and spouse are comfortable with discharge home.  PT Discharge Precautions/Restrictions Precautions Precautions: Fall;Cervical Precaution Booklet Issued: No Precaution Comments: Verbally reviewed cervical precautions.  Required Braces or Orthoses: Cervical Brace Cervical Brace: Hard collar Restrictions Weight Bearing Restrictions: No Vision/Perception  Perception Perception: Within Functional Limits Praxis Praxis: Intact  Cognition Overall Cognitive Status: Within Functional Limits for tasks assessed Arousal/Alertness: Awake/alert Orientation Level: Oriented X4 Attention: Focused;Sustained Focused Attention: Appears intact Sustained Attention: Appears intact Memory: Appears intact Awareness: Appears intact Problem Solving: Appears  intact  Safety/Judgment: Appears intact Sensation Sensation Light Touch: Impaired by gross assessment Peripheral sensation comments: h/o neuropathy but reports sensation in LLE is worse since sx Light Touch Impaired Details: Impaired LLE (L foot) Proprioception: Impaired by gross assessment Proprioception Impaired Details: Impaired LLE;Impaired RLE Coordination Gross Motor Movements are Fluid and Coordinated: No Fine Motor Movements are Fluid and Coordinated: Yes Coordination and Movement Description: Impaired s/p ACDF C7-T-1  Motor  Motor Motor: Abnormal postural alignment and control Motor - Skilled Clinical Observations: LLE weakness > RLE  Motor - Discharge Observations: LLE weakness  Mobility Bed Mobility Bed Mobility: Rolling Right;Rolling Left;Right Sidelying to Sit;Left Sidelying to Sit;Supine to Sit;Sit to Supine;Sitting - Scoot to [employer] of Bed;Sit to Sidelying Right;Sit to Sidelying Left Rolling Right: Supervision/verbal cueing Rolling Left: Supervision/Verbal cueing Right Sidelying to Sit: Supervision/Verbal cueing Left Sidelying to Sit: Supervision/Verbal cueing Supine to Sit: Supervision/Verbal cueing Sitting - Scoot to Edge of Bed: Supervision/Verbal cueing Sit to Supine: Supervision/Verbal cueing Sit to Sidelying Right: Supervision/Verbal cueing Sit to Sidelying Left: Supervision/Verbal cueing Transfers Transfers: Sit to Stand;Stand to Sit;Stand Pivot Transfers Sit to Stand: Contact Guard/Touching assist Stand to Sit: Contact Guard/Touching assist Stand Pivot Transfers: Contact Guard/Touching assist Transfer (Assistive device): Rolling walker Locomotion  Gait Ambulation: Yes Gait Assistance: Contact Guard/Touching assist Gait Distance (Feet): 90 Feet Assistive device: Rolling walker Gait Gait: Yes Gait Pattern: Impaired (Poor clearance of LLE) Gait Pattern: Step-through pattern;Decreased step length - right;Decreased step length - left;Narrow base of  support;Poor foot clearance - left Gait velocity: Decreased High Level Ambulation High Level Ambulation: Side stepping Side Stepping: In parallel bars with both hands on one bar and CGA Stairs / Additional Locomotion Stairs: Yes Stairs Assistance: Minimal Assistance - Patient > 75% Stair Management Technique: One rail Right Number of Stairs: 6 Height of Stairs: 3 Wheelchair Mobility Wheelchair Mobility: No  Trunk/Postural Assessment  Cervical Assessment Cervical Assessment: Exceptions to [facility] (Cervical collar limiting) Thoracic Assessment Thoracic Assessment: Exceptions to [facility] (rounded shoulders) Lumbar Assessment Lumbar Assessment: Exceptions to [facility] (PPT) Postural Control Postural Control: Within Functional Limits  Balance Balance Balance Assessed: Yes Static Sitting Balance Static Sitting - Balance Support: Feet supported Static Sitting - Level of Assistance: 5: Stand by assistance Dynamic Sitting Balance Dynamic Sitting - Balance Support: Feet supported Dynamic Sitting - Level of Assistance: 5: Stand by assistance Static Standing Balance Static Standing - Balance Support: Bilateral upper extremity supported Static Standing - Level of Assistance: Other (comment) (CGA) Dynamic Standing Balance Dynamic Standing - Balance Support: Bilateral upper extremity supported Dynamic Standing - Level of Assistance: Other (comment) (CGA) Extremity Assessment  RLE Assessment RLE Assessment: Within Functional Limits General Strength Comments: grossly 5/5 LLE Assessment LLE Assessment: Exceptions to [facility] General Strength Comments: See below LLE Strength Left Hip Flexion: 2+/5 Left Knee Flexion: 2+/5 Left Knee Extension: 5/5 Left Ankle Dorsiflexion: 2+/5  Nadeen Landau, SPT 06/09/2020, 4:55 PM

## 2020-06-09 NOTE — Progress Notes (Signed)
Physical Therapy Session Note  Patient Details  Name: [name] MRN: [id number] Date of Birth: [birth year]  Today's Date: 06/09/2020 PT Individual Time: 1445-1515 PT Individual Time Calculation (min): 30 min   Short Term Goals: Week 2:  PT Short Term Goal 1 (Week 2): STG = LTG d/t ELOS  Skilled Therapeutic Interventions/Progress Updates:     Patient in w/c finishing lunch with her husband in the room upon PT arrival. Patient requested to finish lunch prior to starting session. Patient alert and agreeable to PT session upon PT return. Patient denied pain during session, however, reported increased fatigue from previous therapy sessions. Requested to review HEP during session. Patient and her husband stated that they feel prepared for d/c after previous therapy sessions and patient declined practicing and gait, stair, or functional mobility this session due to fatigue.   Therapeutic Activity: Transfers: Patient performed sit to/from stand throughout session with supervision for safety using RW.   Therapeutic Exercise: Patient performed the following exercises from HEP handout with verbal and tactile cues for proper technique. -B standing hamstring curls x5 -B standing hip flexion with foot tapping top of low step x5 -L seated DF with foot on step for improved visual feedback x5 with 5 sec hold Recommended supervision with standing exercises for safety patient and her husband in agreement.  Also provided handout and education on energy conservation techniques to manage fatigue at home and with planned outings.  Patient in w/c with her husband in the room at end of session with breaks locked and all needs within reach.    Therapy Documentation Precautions:  Precautions Precautions: Fall, Cervical Precaution Booklet Issued: No Precaution Comments: Verbally reviewed cervical precautions.  Required Braces or Orthoses: Cervical Brace Cervical Brace: Hard  collar Restrictions Weight Bearing Restrictions: No General: PT Amount of Missed Time (min): 30 Minutes PT Missed Treatment Reason: Patient fatigue/eating lunch    Therapy/Group: Individual Therapy  Beatrice Sehgal L Shevelle Smither PT, DPT  06/09/2020, 4:54 PM

## 2020-06-10 MED ORDER — BENAZEPRIL-HYDROCHLOROTHIAZIDE 20-25 MG PO TABS: 1.0000 | ORAL_TABLET | Freq: Every day | ORAL | 1 refills | Status: DC

## 2020-06-10 MED ORDER — CARVEDILOL 12.5 MG PO TABS: 12.5000 mg | ORAL_TABLET | Freq: Two times a day (BID) | ORAL | 0 refills | Status: DC

## 2020-06-10 MED ORDER — PREDNISONE 10 MG PO TABS: 10.0000 mg | ORAL_TABLET | Freq: Every day | ORAL | 0 refills | Status: DC

## 2020-06-10 MED FILL — CARVEDILOL 12.5 MG TABLET: 12.5 | 30 days supply | Qty: 60 | Fill #0

## 2020-06-10 NOTE — Progress Notes (Signed)
[place] PHYSICAL MEDICINE & REHABILITATION PROGRESS NOTE   Subjective/Complaints: Discussed steroid taper with patient. She will break pills in half. She has no complaints.   ROS: Pt denies SOB, abd pain, CP, N/V/C/D, and vision changes  Objective:   No results found. No results for input(s): WBC, HGB, HCT, PLT in the last 72 hours. No results for input(s): NA, K, CL, CO2, GLUCOSE, BUN, CREATININE, CALCIUM in the last 72 hours.  Intake/Output Summary (Last 24 hours) at 06/10/2020 1206 Last data filed at 06/10/2020 0730 Gross per 24 hour  Intake 1196 ml  Output --  Net 1196 ml     Physical Exam: Vital Signs Blood pressure (!) 147/71, pulse 65, temperature 98.2 F (36.8 C), temperature source Oral, resp. rate 18, height 5\' 5"  (1.651 m), weight 105.4 kg, SpO2 98 %. General: Alert and oriented x 3, No apparent distress HEENT: Head is normocephalic, atraumatic, PERRLA, EOMI, sclera anicteric, oral mucosa pink and moist, dentition intact, ext ear canals clear,  Neck: Supple without JVD or lymphadenopathy Heart: Reg rate and rhythm. No murmurs rubs or gallops Chest: CTA bilaterally without wheezes, rales, or rhonchi; no distress Abdomen: Soft, non-tender, non-distended, bowel sounds positive. Extremities: No clubbing, cyanosis, or edema. Pulses are 2+ Skin: Neck incision C/D/I- anterior neck incision Psych: appropriate but vague Musc: No edema in extremities.  No tenderness in extremities. Neuro: Alert Motor: Grossly 4-4+/5 throughout  Assessment/Plan: 1. Functional deficits secondary to C7-T1 cord compression with myelopathy , s/p decompression  which require 3+ hours per day of interdisciplinary therapy in a comprehensive inpatient rehab setting.  Physiatrist is providing close team supervision and 24 hour management of active medical problems listed below.  Physiatrist and rehab team continue to assess barriers to discharge/monitor patient progress toward functional and  medical goals  Care Tool:  Bathing    Body parts bathed by patient: Right arm, Left arm, Chest, Abdomen, Front perineal area, Right upper leg, Left upper leg, Face, Buttocks, Left lower leg, Right lower leg   Body parts bathed by helper: Right lower leg, Left lower leg     Bathing assist Assist Level: Contact Guard/Touching assist     Upper Body Dressing/Undressing Upper body dressing   What is the patient wearing?: Pull over shirt, Orthosis Orthosis activity level: Performed by helper  Upper body assist Assist Level: Minimal Assistance - Patient > 75% Assistive Device Comment: hard cervical collar  Lower Body Dressing/Undressing Lower body dressing      What is the patient wearing?: Underwear/pull up, Pants     Lower body assist Assist for lower body dressing: Supervision/Verbal cueing     Toileting Toileting    Toileting assist Assist for toileting: Supervision/Verbal cueing     Transfers Chair/bed transfer  Transfers assist  Chair/bed transfer activity did not occur: N/A  Chair/bed transfer assist level: Contact Guard/Touching assist Chair/bed transfer assistive device:   Ambulation assist   Ambulation activity did not occur: N/A  Assist level: Contact Guard/Touching assist Assistive device: Walker-rolling Max distance: 74ft   Walk 10 feet activity   Assist  Walk 10 feet activity did not occur: N/A  Assist level: Contact Guard/Touching assist Assistive device: Walker-rolling   Walk 50 feet activity   Assist Walk 50 feet with 2 turns activity did not occur: N/A  Assist level: Contact Guard/Touching assist Assistive device: Walker-rolling    Walk 150 feet activity   Assist Walk 150 feet activity did not occur: Safety/medical concerns  Walk 10 feet on uneven surface  activity   Assist Walk 10 feet on uneven surfaces activity did not occur: Safety/medical concerns          Wheelchair     Assist Will patient use wheelchair at discharge?: Yes Type of Wheelchair: Manual Wheelchair activity did not occur: Safety/medical concerns  Wheelchair assist level: Dependent - Patient 0%      Wheelchair 50 feet with 2 turns activity    Assist    Wheelchair 50 feet with 2 turns activity did not occur: Safety/medical concerns   Assist Level: Dependent - Patient 0%   Wheelchair 150 feet activity     Assist  Wheelchair 150 feet activity did not occur: Safety/medical concerns   Assist Level: Dependent - Patient 0%   Blood pressure (!) 147/71, pulse 65, temperature 98.2 F (36.8 C), temperature source Oral, resp. rate 18, height 5\' 5"  (1.651 m), weight 105.4 kg, SpO2 98 %.  Medical Problem List and Plan: 1. Weaknesssecondary to large herniated disc at C7-T1/cord compression. Status post anterior cervical discectomy with decompression 05/27/2020. Cervical collar as directed  DC home today.  2. Antithrombotics: -DVT/anticoagulation:Xarelto. Vascular study is positive for indeterminate bilateral peroneal DVT.  Repeat on 7/9 shows new popliteal DVT and stable prior.  -antiplatelet therapy: N/A 3. Pain Management:Neurontin 1200 mg nightly and 1500 mg daily, Flexeril 10 mg 3 times daily as needed, oxycodone and tramadol as needed  Some C8 radicular pain as expected   Scheduled Meloxicam as per patient's request.   Added Prednisone 10mg  TID for patient's right arm neuropathic pain to help decreased nerve root compression.   Diclofenac gel d/ced as pt on Mobic.  7/15: Discussed prednisone taper at home. She will break pills in half.  4. Mood:Xanax as needed. Positive mood.  -antipsychotic agents: N/A 5. Neuropsych: This patientiscapable of making decisions on herown behalf. 6. Skin/Wound Care:Routine skin checks 7. Fluids/Electrolytes/Nutrition:Routine in and outs 8. Hypertension. Lotensin 20 mg daily, HCTZ 25 mg  daily, Coreg 6.25 mg twice daily. Monitor with increased mobility Vitals:   06/09/20 1908 06/10/20 0537  BP: (!) 165/81 (!) 147/71  Pulse: 65 65  Resp: 18 18  Temp: 98.2 F (36.8 C)   SpO2: 98% 98%   D/ced HCTZ to 6.25mg .   Coreg increased back to 6.25 on 7/10  Elevated on 7/11, will not make any further changes today given recent changes, however will continue to monitor and consider further adjustments as necessary  7/12- BP in 150s/70s- due to SCI, can have orthostatic hypotension- pt is aware- suggested waiting a few days to get to make additional changes- would rather be a little high than too low.   7/13- BP 140s-150s systolic- will give 1 more day before increasing meds  7/14: Increase Coreg to 12.5mg  BID.  7/15: Maintain current dose. She will follow up with her cardiologist.  9. Hypothyroidism. Synthroid 10. Hyperlipidemia. Crestor 11. Macrocytosis without anemia. 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 12. History of alcohol use. Provide counseling 13.  Drug-inducedconstipation vs neurogenic bowel. Laxative assistance  Bowel meds increased on 7/11  7/13- refusing colace- will stop 14.  Hypoalbuminemia  Supplement initiated 7/10 15. Bladder- neurogenic  7/12- discussed decreased awareness of filling and how pertains to SCI 16. Intimacy/sexuality  7/12- affected in SCI- discussed at length how this is important to address in SCI- and how to address  at home once leaves CIR.  7/14: DC home with husband today   >30 minutes spent in discharge of patient including review of medications and follow-up appointments, physical examination, discussion of steroid taper, discussion of blood pressure, and in answering all patient's questions   LOS: 12 days A FACE TO FACE EVALUATION WAS PERFORMED  Clint Bolder P Ravis Herne 06/10/2020, 12:06 PM

## 2020-06-10 NOTE — Progress Notes (Signed)
Patient discharged with husband and took all her belongings. Patient happy, stable, and without distress.

## 2020-06-10 NOTE — Plan of Care (Signed)
  Problem: Consults Goal: RH GENERAL PATIENT EDUCATION Description: See Patient Education module for education specifics, education on spinal cord injury during stay on rehab Outcome: Completed/Met Goal: Skin Care Protocol Initiated - if Braden Score 18 or less Description: If consults are not indicated, leave blank or document N/A Outcome: Completed/Met   Problem: RH BOWEL ELIMINATION Goal: RH STG MANAGE BOWEL WITH ASSISTANCE Description: STG Manage Bowel with supervision Assistance. Outcome: Completed/Met   Problem: RH SKIN INTEGRITY Goal: RH STG SKIN FREE OF INFECTION/BREAKDOWN Description: Skin to remain free from infection and breakdown with supervision assistance Outcome: Completed/Met Goal: RH STG MAINTAIN SKIN INTEGRITY WITH ASSISTANCE Description: STG Maintain Skin Integrity With supervision Assistance. Outcome: Completed/Met   Problem: RH SAFETY Goal: RH STG ADHERE TO SAFETY PRECAUTIONS W/ASSISTANCE/DEVICE Description: STG Adhere to Safety Precautions With supervision Assistance appropriate assistive Device. Outcome: Completed/Met   Problem: RH PAIN MANAGEMENT Goal: RH STG PAIN MANAGED AT OR BELOW PT'S PAIN GOAL Description: <4 on a 0-10 pain scale. Outcome: Completed/Met

## 2020-06-11 ENCOUNTER — Telehealth: Payer: Self-pay | Admitting: Cardiology

## 2020-06-11 NOTE — Progress Notes (Signed)
Inpatient Rehabilitation Care Coordinator  Discharge Note  The overall goal for the admission was met for:   Discharge location: Yes. D/c to home with support from her husband.   Length of Stay: Yes. 11 days.   Discharge activity level: Yes. Mod I to Supervision  Home/community participation: Yes. Limited.  Services provided included: MD, RD, PT, OT, RN, CM, TR, Pharmacy, Neuropsych and SW  Financial Services: Private Insurance: [employer]  Follow-up services arranged: Home Health: [facility] for PT/OT and DME: husband to purchase TTB  Comments (or additional information): contact pt [phone]  Patient/Family verbalized understanding of follow-up arrangements: Yes  Individual responsible for coordination of the follow-up plan: Pt to have assistance with coordinating care needs.   Confirmed correct DME delivered: Rana Snare 06/11/2020    Rana Snare

## 2020-06-11 NOTE — Telephone Encounter (Signed)
Returned call to patient.She stated she was discharged from [facility] hospital yesterday with a fractured spine.Stated she is concerned  coreg was increased to 12.5 mg twice a day and lasix was stopped.She feels swollen in abdomen.She wants to restart lasix 20 mg daily and decrease coreg back to 6.25 mg twice a day.She will monitor B/P daily.Advised Dr.Harding out of office today.I will send message to him for advice.

## 2020-06-11 NOTE — Telephone Encounter (Signed)
[name] is calling requesting a nurse call her to discuss medication changes made at the hospital. Please advise.

## 2020-06-11 NOTE — Telephone Encounter (Signed)
They increase her carvedilol because of high blood pressures in the hospital.  The question will be what is her blood pressure level currently? -->  As long as her blood pressures are in stable range, I would leave the carvedilol as is, but if her blood pressures are running low when she is having dizziness with standing or having fatigue then I would backed back down to 6.25 mg of carvedilol twice daily.  Otherwise she may build tolerate the 12.5 mg twice daily increase.  Perfectly fine with restarting Lasix 20 mg daily with additional days of 40 mg as necessary per my last note.   Bryan Lemma, MD

## 2020-06-14 ENCOUNTER — Telehealth: Payer: Self-pay | Admitting: Registered Nurse

## 2020-06-14 NOTE — Telephone Encounter (Signed)
Spoke to patient Dr.Harding's advice given.She will restart Lasix 20 mg daily and take 40 mg if needed.She stated her B/P has been normal since she has been home.Advised to continue to monitor B/P and keep appointment with Dr.Harding 7/30 at 4:20 PM.

## 2020-06-14 NOTE — Telephone Encounter (Signed)
TC Call Placed, no answer left message to return the call.

## 2020-06-15 NOTE — Telephone Encounter (Signed)
Transitional Care call--patient   1. Are you/is patient experiencing any problems since coming home? No  Are there any questions regarding any aspect of care? No 2. Are there any questions regarding medications administration/dosing? No Are meds being taken as prescribed? Yes Patient should review meds with caller to confirm 3. Have there been any falls? No 4. Has Home Health been to the house and/or have they contacted you? Yes If not, have you tried to contact them? Can we help you contact them? 5. Are bowels and bladder emptying properly? No Are there any unexpected incontinence issues? Yes If applicable, is patient following bowel/bladder programs? 6. Any fevers, problems with breathing, unexpected pain? No 7. Are there any skin problems or new areas of breakdown? No 8. Has the patient/family member arranged specialty MD follow up (ie cardiology/neurology/renal/surgical/etc)? Waiting on Dr. Lovell Sheehan office to call back with appt Can we help arrange? 9. Does the patient need any other services or support that we can help arrange? No 10. Are caregivers following through as expected in assisting the patient? Yes 11. Has the patient quit smoking, drinking alcohol, or using drugs as recommended? No smoke, no illicit drugs, 1-2 glasses of wine with dinner  Appointment time 1:20pm, arrive time 1:00 pm on 726/2021 with [name] Lefevre, NP [address] [phone]

## 2020-06-16 ENCOUNTER — Telehealth: Payer: Self-pay

## 2020-06-16 DIAGNOSIS — I5030 Unspecified diastolic (congestive) heart failure: Secondary | ICD-10-CM | POA: Diagnosis not present

## 2020-06-16 DIAGNOSIS — I110 Hypertensive heart disease with heart failure: Secondary | ICD-10-CM | POA: Diagnosis not present

## 2020-06-16 DIAGNOSIS — E039 Hypothyroidism, unspecified: Secondary | ICD-10-CM | POA: Diagnosis not present

## 2020-06-16 DIAGNOSIS — M5023 Other cervical disc displacement, cervicothoracic region: Secondary | ICD-10-CM | POA: Diagnosis not present

## 2020-06-16 DIAGNOSIS — M48061 Spinal stenosis, lumbar region without neurogenic claudication: Secondary | ICD-10-CM | POA: Diagnosis not present

## 2020-06-16 DIAGNOSIS — Z4789 Encounter for other orthopedic aftercare: Secondary | ICD-10-CM | POA: Diagnosis not present

## 2020-06-16 DIAGNOSIS — D509 Iron deficiency anemia, unspecified: Secondary | ICD-10-CM | POA: Diagnosis not present

## 2020-06-16 DIAGNOSIS — M47816 Spondylosis without myelopathy or radiculopathy, lumbar region: Secondary | ICD-10-CM | POA: Diagnosis not present

## 2020-06-16 DIAGNOSIS — M47812 Spondylosis without myelopathy or radiculopathy, cervical region: Secondary | ICD-10-CM | POA: Diagnosis not present

## 2020-06-16 NOTE — Telephone Encounter (Signed)
Verbal order given for Home Health Physical Therapy twice weekly for 3 weeks. Then once a week for  3 weeks. Okay given.

## 2020-06-17 DIAGNOSIS — D509 Iron deficiency anemia, unspecified: Secondary | ICD-10-CM | POA: Diagnosis not present

## 2020-06-17 DIAGNOSIS — M5023 Other cervical disc displacement, cervicothoracic region: Secondary | ICD-10-CM | POA: Diagnosis not present

## 2020-06-17 DIAGNOSIS — Z4789 Encounter for other orthopedic aftercare: Secondary | ICD-10-CM | POA: Diagnosis not present

## 2020-06-17 DIAGNOSIS — M47812 Spondylosis without myelopathy or radiculopathy, cervical region: Secondary | ICD-10-CM | POA: Diagnosis not present

## 2020-06-17 DIAGNOSIS — I110 Hypertensive heart disease with heart failure: Secondary | ICD-10-CM | POA: Diagnosis not present

## 2020-06-17 DIAGNOSIS — E039 Hypothyroidism, unspecified: Secondary | ICD-10-CM | POA: Diagnosis not present

## 2020-06-17 DIAGNOSIS — I5030 Unspecified diastolic (congestive) heart failure: Secondary | ICD-10-CM | POA: Diagnosis not present

## 2020-06-17 DIAGNOSIS — M47816 Spondylosis without myelopathy or radiculopathy, lumbar region: Secondary | ICD-10-CM | POA: Diagnosis not present

## 2020-06-17 DIAGNOSIS — M48061 Spinal stenosis, lumbar region without neurogenic claudication: Secondary | ICD-10-CM | POA: Diagnosis not present

## 2020-06-18 DIAGNOSIS — I5030 Unspecified diastolic (congestive) heart failure: Secondary | ICD-10-CM | POA: Diagnosis not present

## 2020-06-18 DIAGNOSIS — I110 Hypertensive heart disease with heart failure: Secondary | ICD-10-CM | POA: Diagnosis not present

## 2020-06-18 DIAGNOSIS — M5023 Other cervical disc displacement, cervicothoracic region: Secondary | ICD-10-CM | POA: Diagnosis not present

## 2020-06-18 DIAGNOSIS — D509 Iron deficiency anemia, unspecified: Secondary | ICD-10-CM | POA: Diagnosis not present

## 2020-06-18 DIAGNOSIS — M48061 Spinal stenosis, lumbar region without neurogenic claudication: Secondary | ICD-10-CM | POA: Diagnosis not present

## 2020-06-18 DIAGNOSIS — M47816 Spondylosis without myelopathy or radiculopathy, lumbar region: Secondary | ICD-10-CM | POA: Diagnosis not present

## 2020-06-18 DIAGNOSIS — E039 Hypothyroidism, unspecified: Secondary | ICD-10-CM | POA: Diagnosis not present

## 2020-06-18 DIAGNOSIS — Z4789 Encounter for other orthopedic aftercare: Secondary | ICD-10-CM | POA: Diagnosis not present

## 2020-06-18 DIAGNOSIS — M47812 Spondylosis without myelopathy or radiculopathy, cervical region: Secondary | ICD-10-CM | POA: Diagnosis not present

## 2020-06-21 ENCOUNTER — Other Ambulatory Visit: Payer: Self-pay

## 2020-06-21 ENCOUNTER — Encounter: Payer: Medicare PPO | Attending: Registered Nurse | Admitting: Registered Nurse

## 2020-06-21 ENCOUNTER — Encounter: Payer: Self-pay | Admitting: Registered Nurse

## 2020-06-21 VITALS — BP 92/60 | HR 78 | Temp 98.2°F | Ht 65.0 in | Wt 228.0 lb

## 2020-06-21 DIAGNOSIS — E039 Hypothyroidism, unspecified: Secondary | ICD-10-CM | POA: Insufficient documentation

## 2020-06-21 DIAGNOSIS — G9520 Unspecified cord compression: Secondary | ICD-10-CM

## 2020-06-21 DIAGNOSIS — I10 Essential (primary) hypertension: Secondary | ICD-10-CM | POA: Insufficient documentation

## 2020-06-21 DIAGNOSIS — M5000 Cervical disc disorder with myelopathy, unspecified cervical region: Secondary | ICD-10-CM | POA: Insufficient documentation

## 2020-06-21 DIAGNOSIS — E7849 Other hyperlipidemia: Secondary | ICD-10-CM | POA: Diagnosis not present

## 2020-06-21 DIAGNOSIS — G8918 Other acute postprocedural pain: Secondary | ICD-10-CM | POA: Diagnosis not present

## 2020-06-21 NOTE — Progress Notes (Signed)
Subjective:    Patient ID: [name], female    DOB: 01/01/1946, 74 y.o.   MRN: [id number]  HPI: [name] is a 74 y.o. female who is here for Transitional Care Visit for follow up HNP ( Herniated Nucleus Pulposus), Spinal Cord Compression, Hypertension and Hypothyroidism. [name] was brought to the emergency room via EMS on 05/24/2020 with complaints of  left leg weakness.   CT Head WO Contrast:  IMPRESSION: 1. No acute intracranial abnormality. 2. Chronic RIGHT maxillary sinus disease. 3. Signs of trauma to the RIGHT nasal bone with similar appearance in terms of deformity.  MR Brain: WO Contrast IMPRESSION: 1. Negative brain MRI for age, with no acute intracranial abnormality identified. 2. Chronic right maxillary sinusitis.  MR Lumbar Spine WO Contrast:  IMPRESSION: Multilevel degenerative change throughout the lumbar spine. Negative for lumbar fracture  Mild spinal stenosis L3-4 with mild subarticular stenosis bilaterally.  Moderate spinal stenosis at L4-5 with moderate subarticular stenosis bilaterally. Neurosurgery was consulted: She underwent : see below on 05/26/2020 by Dr Lovell Sheehan.  Cervical seven - Thoracic one ANTERIOR CERVICAL DISCECTOMY AND PLATING Left   [name] was admitted to Inpatient Rehabilitation on 05/29/2020 and discharged home on 06/10/2020. [name] reports left leg with tingling. She rates her pain 4. She is receiving Home Health Therapy with [facility]. Also states she has a good appetite.   [name] states she was trying to cut her toenail on the right 2nd and third digit, when she accidentally cut her skin and toenail too deep. She came with dressing, dressing was removed and site was cleansed  and bandaid applied. Right second digit nailbed with dry blood noted, site cleansed and band aid applied. [name] was instructed to continue to monitor, no drainage or odor noted and to F/U with her PCP and have a  podiatrist cut her nails, she verbalizes understanding.   [name] Morphine equivalent is 62.29 MME, she states she is taking her oxycodone daily. She is also prescribed Alprazolam by Mariam Dollar PA, she states se is not taking the alprazolam. We have discussed the black box warning of using opioids and benzodiazepines. I highlighted the dangers of using these drugs together and discussed the adverse events including respiratory suppression, overdose, cognitive impairment and importance of compliance with current regimen. We will continue to monitor and adjust as indicated. She verbalizes understanding.   Husband in room.    Pain Inventory Average Pain 4 Pain Right Now 4 My pain is constant, burning, stabbing and aching  In the last 24 hours, has pain interfered with the following? General activity 8 Relation with others 0 Enjoyment of life 0 What TIME of day is your pain at its worst? night Sleep (in general) Fair  Pain is worse with: walking, bending, standing and some activites Pain improves with: rest and medication Relief from Meds: 9  Mobility use a walker how many minutes can you walk? 10 mins ability to climb steps?  yes do you drive?  no  Function I need assistance with the following:  meal prep, household duties and shopping Do you have any goals in this area?  yes  Neuro/Psych bladder control problems weakness numbness tremor tingling trouble walking  Prior Studies Any changes since last visit?  no New patient  Physicians involved in your care Any changes since last visit?  no New Patient   Family History  Problem Relation Age of Onset  . Heart disease Mother   .  Stroke Mother 66  . Cancer Mother        skin and breast  . Hypertension Mother   . Breast cancer Mother 73  . COPD Father   . Cancer Father   . Diabetes Father   . Hypertension Father   . Stroke Maternal Grandmother   . Mental illness Maternal Grandmother   . Breast cancer  Sister   . Cancer Cousin 63   Social History   Socioeconomic History  . Marital status: Married    Spouse name: Not on file  . Number of children: Not on file  . Years of education: Not on file  . Highest education level: Not on file  Occupational History  . Occupation: Charity fundraiser    Comment: ICU Sales executive  Tobacco Use  . Smoking status: Never Smoker  . Smokeless tobacco: Never Used  Vaping Use  . Vaping Use: Never used  Substance and Sexual Activity  . Alcohol use: Yes    Alcohol/week: 28.0 standard drinks    Types: 28 Glasses of wine per week    Comment: drinks daily and has a bottle of wine per day  . Drug use: No  . Sexual activity: Yes    Birth control/protection: Post-menopausal  Other Topics Concern  . 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 Difficulty of Paying Living Expenses:   Food Insecurity:   . Worried About Programme researcher, broadcasting/film/video in the Last Year:   . Barista in the Last Year:   Transportation Needs:   . Freight forwarder (Medical):   Marland Kitchen Lack of Transportation (Non-Medical):   Physical Activity:   . Days of Exercise per Week:   . Minutes of Exercise per Session:   Stress:   . Feeling of Stress :   Social Connections:   . Frequency of Communication with Friends and Family:   . Frequency of Social Gatherings with Friends and Family:   . Attends Religious Services:   . Active Member of Clubs or Organizations:   . Attends Banker Meetings:   Marland Kitchen Marital Status:    Past Surgical History:  Procedure Laterality Date  . ANTERIOR CERVICAL DECOMP/DISCECTOMY FUSION Left 05/26/2020   Procedure: Cervical seven - Thoracic one ANTERIOR CERVICAL DISCECTOMY AND PLATING;  Surgeon: Tressie Stalker, MD;  Location: [facility] OR;   Service: Neurosurgery;  Laterality: Left;  . CHOLECYSTECTOMY    . COSMETIC SURGERY    . DIAGNOSTIC LAPAROSCOPY    . ESOPHAGOGASTRODUODENOSCOPY N/A 04/25/2015   Procedure: ESOPHAGOGASTRODUODENOSCOPY (EGD);  Surgeon: Beverley Fiedler, MD;  Location: [facility] ENDOSCOPY;  Service: Endoscopy;  Laterality: N/A;  . FRACTURE SURGERY    . GASTRIC BYPASS    . NM MYOVIEW LTD  10/10/2017   Normal LV size and function EF 72%.  Medium sized mild severity defect in the apical septal apical lateral and apical wall suggestive of breast attenuation.  LOW RISK.  No ischemia or infarction.    . OPEN REDUCTION INTERNAL FIXATION (ORIF) DISTAL RADIAL FRACTURE Right 01/15/2013   Procedure: OPEN REDUCTION INTERNAL FIXATION (ORIF) Right DISTAL RADIUS FRACTURE;  Surgeon: Eldred Manges, MD;  Location: [facility] OR;  Service: Orthopedics;  Laterality: Right;  . TRANSTHORACIC ECHOCARDIOGRAM  10/04/2017   Mild LVH.  EF 60-65%.  Grade 2/moderate diastolic dysfunction.  Mild pulmonary hypertension.  No valve lesions   Past Medical History:  Diagnosis Date  . Alcohol abuse 04/25/2015  . Allergy   . Anastomotic ulcer S/P gastric bypass 06/18/2016  . Arthritis   . Cataract   . Degenerative disc disease, cervical   . GAD (generalized anxiety disorder) 06/14/2016  . HTN (hypertension) 04/25/2015  . Hypertension   . Hypothyroidism   . 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 Substance abuse (HCC)    Alcohol abuse 2016  . UGIB (upper gastrointestinal bleed) 04/25/2015   BP (!) 92/60   Pulse 78   Temp 98.2 F (36.8 C)   Ht 5\' 5"  (1.651 m)   Wt (!) 228 lb (103.4 kg)   SpO2 93%   BMI 37.94 kg/m   Opioid Risk Score:   Fall Risk Score:  `1  Depression screen PHQ 2/9  Depression screen [facility] 2/9 06/21/2020 04/13/2020 11/06/2019 05/08/2019 01/13/2019 11/04/2018 09/09/2018  Decreased Interest 0 0 0 0 0 0 0  Down, Depressed, Hopeless 0 0 0 0 1 0 1  PHQ - 2 Score 0 0 0 0 1 0 1  Altered sleeping 3 3 3  1 2 3 3   Tired, decreased energy 1 1 0 0 1 1 3   Change in appetite 0 0 0 0 0 1 1  Feeling bad or failure about yourself  0 0 0 0 0 0 0  Trouble concentrating 1 0 0 0 0 0 1  Moving slowly or fidgety/restless 0 0 0 0 0 0 2  Suicidal thoughts 0 0 0 0 0 0 0  PHQ-9 Score 5 4 3 1 4 5 11   Difficult doing work/chores - Not difficult at all Not difficult at all Not difficult at all Not difficult at all Not difficult at all Somewhat difficult  Some recent data might be hidden    Review of Systems  Constitutional: Negative.   HENT: Negative.   Eyes: Negative.   Respiratory: Negative.   Cardiovascular: Negative.   Gastrointestinal: Negative.   Endocrine: Negative.   Genitourinary: Positive for frequency.  Musculoskeletal: Positive for gait problem.       Spasms  Skin: Positive for rash.  Allergic/Immunologic: Negative.   Neurological: Positive for tremors, weakness and numbness.  Hematological: Bruises/bleeds easily.       Objective:   Physical Exam Vitals and nursing note reviewed.  Constitutional:      Appearance: Normal appearance.  Neck:     Comments: Cervical steri Strips intact ( Anterior)  Wearing Cervical Collar Musculoskeletal:     Cervical back: Normal range of motion and neck supple.     Comments: Normal Muscle Bulk and Muscle Testing Reveals:  Upper Extremities: Decreased ROM 30 Degrees and Muscle Strength 5/5 Thoracic Paraspinal Tenderness: T-1-T-3 Mainly Left Side Lumbar Paraspinal Tenderness: L-4-L-5 Mainly Right Side Lower Extremities: Decreased ROM and Muscle Strength 5/5 Arises from Table slowly using walker for support Narrow Based   Gait   Skin:    General: Skin is warm and dry.  Neurological:     General: No focal deficit present.     Mental Status: She is alert and oriented to person, place, and time.  Psychiatric:        Mood and Affect: Mood normal.        Behavior: Behavior normal.  Assessment & Plan:  1. HNP ( Herniated Nucleus  Pulposus): Spinal Cord Compression: [facility]. She has an appointment with Dr Lovell SheehanJenkins. Continue to Monitor.   2. Hypertension: Continue current medication regimen. PCP Following. Continue to Monitor.  3. Hypothyroidism: Continue current Medication Regimen. PCP Following.  4. Postoperative Pain: [name] states she's only taking her Oxycodone daily.  5. Chronic Pain: Dr. Benjamin Stainhekkekandam is prescribing her Tramadol, last fill date was 05/24/2020.   20 minutes of face to face patient care time was spent during this visit. All questions were encouraged and answered.  F/U with Dr Berline ChoughLovorn in 4- 6 weeks

## 2020-06-21 NOTE — Patient Instructions (Signed)
Please send a My-Chart Message by 07/06/2020- 07/08/2020, regarding your Oxycodone. When you call let me know how many Oxycodone tablets you have.

## 2020-06-22 DIAGNOSIS — I110 Hypertensive heart disease with heart failure: Secondary | ICD-10-CM | POA: Diagnosis not present

## 2020-06-22 DIAGNOSIS — M5023 Other cervical disc displacement, cervicothoracic region: Secondary | ICD-10-CM | POA: Diagnosis not present

## 2020-06-22 DIAGNOSIS — M5020 Other cervical disc displacement, unspecified cervical region: Secondary | ICD-10-CM | POA: Diagnosis not present

## 2020-06-22 DIAGNOSIS — Z4789 Encounter for other orthopedic aftercare: Secondary | ICD-10-CM | POA: Diagnosis not present

## 2020-06-22 DIAGNOSIS — D509 Iron deficiency anemia, unspecified: Secondary | ICD-10-CM | POA: Diagnosis not present

## 2020-06-22 DIAGNOSIS — M47816 Spondylosis without myelopathy or radiculopathy, lumbar region: Secondary | ICD-10-CM | POA: Diagnosis not present

## 2020-06-22 DIAGNOSIS — M5003 Cervical disc disorder with myelopathy, cervicothoracic region: Secondary | ICD-10-CM | POA: Diagnosis not present

## 2020-06-22 DIAGNOSIS — I5030 Unspecified diastolic (congestive) heart failure: Secondary | ICD-10-CM | POA: Diagnosis not present

## 2020-06-22 DIAGNOSIS — M47812 Spondylosis without myelopathy or radiculopathy, cervical region: Secondary | ICD-10-CM | POA: Diagnosis not present

## 2020-06-22 DIAGNOSIS — E039 Hypothyroidism, unspecified: Secondary | ICD-10-CM | POA: Diagnosis not present

## 2020-06-22 DIAGNOSIS — M48061 Spinal stenosis, lumbar region without neurogenic claudication: Secondary | ICD-10-CM | POA: Diagnosis not present

## 2020-06-23 DIAGNOSIS — I110 Hypertensive heart disease with heart failure: Secondary | ICD-10-CM | POA: Diagnosis not present

## 2020-06-23 DIAGNOSIS — M5023 Other cervical disc displacement, cervicothoracic region: Secondary | ICD-10-CM | POA: Diagnosis not present

## 2020-06-23 DIAGNOSIS — D509 Iron deficiency anemia, unspecified: Secondary | ICD-10-CM | POA: Diagnosis not present

## 2020-06-23 DIAGNOSIS — I5030 Unspecified diastolic (congestive) heart failure: Secondary | ICD-10-CM | POA: Diagnosis not present

## 2020-06-23 DIAGNOSIS — M48061 Spinal stenosis, lumbar region without neurogenic claudication: Secondary | ICD-10-CM | POA: Diagnosis not present

## 2020-06-23 DIAGNOSIS — E039 Hypothyroidism, unspecified: Secondary | ICD-10-CM | POA: Diagnosis not present

## 2020-06-23 DIAGNOSIS — M47816 Spondylosis without myelopathy or radiculopathy, lumbar region: Secondary | ICD-10-CM | POA: Diagnosis not present

## 2020-06-23 DIAGNOSIS — Z4789 Encounter for other orthopedic aftercare: Secondary | ICD-10-CM | POA: Diagnosis not present

## 2020-06-23 DIAGNOSIS — M47812 Spondylosis without myelopathy or radiculopathy, cervical region: Secondary | ICD-10-CM | POA: Diagnosis not present

## 2020-06-25 ENCOUNTER — Other Ambulatory Visit: Payer: Self-pay

## 2020-06-25 ENCOUNTER — Telehealth: Payer: Self-pay | Admitting: Cardiology

## 2020-06-25 ENCOUNTER — Ambulatory Visit: Payer: Medicare PPO | Admitting: Cardiology

## 2020-06-25 ENCOUNTER — Encounter: Payer: Self-pay | Admitting: Cardiology

## 2020-06-25 VITALS — BP 150/88 | HR 82 | Ht 65.5 in | Wt 229.4 lb

## 2020-06-25 DIAGNOSIS — M5023 Other cervical disc displacement, cervicothoracic region: Secondary | ICD-10-CM | POA: Diagnosis not present

## 2020-06-25 DIAGNOSIS — Z79899 Other long term (current) drug therapy: Secondary | ICD-10-CM | POA: Diagnosis not present

## 2020-06-25 DIAGNOSIS — I10 Essential (primary) hypertension: Secondary | ICD-10-CM

## 2020-06-25 DIAGNOSIS — I5033 Acute on chronic diastolic (congestive) heart failure: Secondary | ICD-10-CM

## 2020-06-25 DIAGNOSIS — I110 Hypertensive heart disease with heart failure: Secondary | ICD-10-CM | POA: Diagnosis not present

## 2020-06-25 DIAGNOSIS — M47812 Spondylosis without myelopathy or radiculopathy, cervical region: Secondary | ICD-10-CM | POA: Diagnosis not present

## 2020-06-25 DIAGNOSIS — E782 Mixed hyperlipidemia: Secondary | ICD-10-CM | POA: Diagnosis not present

## 2020-06-25 DIAGNOSIS — M48061 Spinal stenosis, lumbar region without neurogenic claudication: Secondary | ICD-10-CM | POA: Diagnosis not present

## 2020-06-25 DIAGNOSIS — D509 Iron deficiency anemia, unspecified: Secondary | ICD-10-CM | POA: Diagnosis not present

## 2020-06-25 DIAGNOSIS — I5030 Unspecified diastolic (congestive) heart failure: Secondary | ICD-10-CM | POA: Diagnosis not present

## 2020-06-25 DIAGNOSIS — I878 Other specified disorders of veins: Secondary | ICD-10-CM | POA: Diagnosis not present

## 2020-06-25 DIAGNOSIS — M47816 Spondylosis without myelopathy or radiculopathy, lumbar region: Secondary | ICD-10-CM | POA: Diagnosis not present

## 2020-06-25 DIAGNOSIS — E039 Hypothyroidism, unspecified: Secondary | ICD-10-CM | POA: Diagnosis not present

## 2020-06-25 DIAGNOSIS — Z4789 Encounter for other orthopedic aftercare: Secondary | ICD-10-CM | POA: Diagnosis not present

## 2020-06-25 MED ORDER — CARVEDILOL 6.25 MG PO TABS: 6.2500 mg | ORAL_TABLET | Freq: Two times a day (BID) | ORAL | 3 refills | Status: DC

## 2020-06-25 NOTE — Patient Instructions (Addendum)
Medication Instructions:   INCREASE Lasix to 40 mg 3 days, 20 mg 2 days   TAKE Carvedilol (Coreg) 6.25 2 times a day *If you need a refill on your cardiac medications before your next appointment, please call your pharmacy*  Lab Work: Your physician recommends that you return for lab work in 2 weeks:  BMET  If you have labs (blood work) drawn today and your tests are completely normal, you will receive your results only by: Marland Kitchen MyChart Message (if you have MyChart) OR . A paper copy in the mail If you have any lab test that is abnormal or we need to change your treatment, we will call you to review the results.  Testing/Procedures: NONE ordered at this time of appointment   Follow-Up: At [facility], you and your health needs are our priority.  As part of our continuing mission to provide you with exceptional heart care, we have created designated Provider Care Teams.  These Care Teams include your primary Cardiologist (physician) and Advanced Practice Providers (APPs -  Physician Assistants and Nurse Practitioners) who all work together to provide you with the care you need, when you need it.  Your next appointment:   Follow up as scheduled 07/28/2020  The format for your next appointment:   In Person  Provider:   Bryan Lemma, MD  Other Instructions

## 2020-06-25 NOTE — Progress Notes (Signed)
Primary Care Provider: Mayer Masker, PA-C Cardiologist: Bryan Lemma, MD Electrophysiologist: None  Neurosurgeon: Dr. Lovell Sheehan  Clinic Note: Chief Complaint  Patient presents with  . Hospitalization Follow-up    Prolonged hospitalization with cervical herniated disc with spinal cord compression.  . Congestive Heart Failure    Mild acute exacerbation of HFpEF/venous stasis> 7 pound weight gain-  . Hypertension    Has questions about carvedilol dosing    HPI:    [name] is a 74 y.o. female with a PMH notable for chronic HFpEF with chronic edema/venous insufficiency, HTN, HLD, Obesity (h/o GOP) , ALCOHOL ABUSE (drinks a bottle of wine a day) with h/o GI Bleed who presents today for ~4 month & Hospital f/u (needs to discuss medication regimen).  [name] was last seen in-person on January 13, 2020 for annual follow-up.  She was feeling a little worse as far as dyspnea goes over the last few weeks.  For couple weeks she had noted increased dyspnea with on exertion and worsening swelling --> she noted that her legs are swollen and painful in her feet and was pressing out of her shoes.  She is also sleeping on complex her pillows with some orthopnea and PND.  She is very sedentary. --> ROS: She noted mid malaise and fatigue.  Had lost about 30 pounds but was gaining some back of fluid weight.  Worsening cough and dyspnea.  Leg edema.  Heartburn, joint pain, vertigo/poor balance and headaches.  Plan was to cut down HCTZ (remove from Combo Med) & increase Loop diuretic -> 40 mg BID Lasix x 3 d then 40 mg daily x 3 d -- once back to baseline wgt, go back to 20 mg.  Checked BNP & CMP  Recommended Ace wraps or compression stockings.  She was seen via telemedicine on March 4 to follow-up from this exacerbation. -->  Her weight was back down to roughly 234 pounds.  Feeling a lot better.  Breathing, swelling and energy improved.  No longer having redness and  warmth.  Continued on 20 mg Lasix daily with plans to take 40 mg daily along 2 days out of the week as standing dose as well as as needed for additional swelling.  Ace wraps or support stockings  She walks with a walker outside the house.  Recent Hospitalizations:   6/28-7/3 /2021: Woke up the morning of 6/25 with significant left leg weakness and numbness from the knee down.  Symptoms got progressively worse and she called EMS because she was unable to walk.  (Has history of herniated cervical disks but never had upper or lower extremity numbness) --> brain MRI was performed as head CT did not show acute stroke.  MRI brain was nonrevealing, however cervical-lumbar MRI revealed acute cord compression from C7-T1 disc fragment (herniated nucleus pulposis with Cervical myelopathy). --> NSGx c/s --> emergent Anterior Cervical Discectomy/Decompression --> had significant improvement in left lower extremity weakness. --> d/c to CIR.  No SSx of EtOH withdrawal - covered with CIWA protocol   CHF was adequately compensated.   Noted to have low Albumin - protein malnutrition.   7/3-15/2021 - CIR -->   Dx - Age Intermediate Bilateral Peroneal V DVT - started on Xarelto dose pack (with plan to convert from 15 mg BID to 20 mg daily on 7/28)  Pain management with Neurontin & Flexeril with PRN Oxycodone & tramadol  Prednisone added for nerve root decompression.   BP noted to be Elevated --> Cored  dose was increased to 12.5 mg BID (she never took this increased dose).   Reviewed  CV studies:    The following studies were reviewed today: (if available, images/films reviewed: From Epic Chart or Care Everywhere) . n/a:   Interval History:   [name] presents here today in follow-up from her hospital stay.  She did fairly well in cardiac rehab.  From a volume standpoint she is doing pretty well.  She was able to start wearing support hose with the toes cut out since I last saw her.   She says that she is slowly improving each day since her discharge.  Unfortunately, she has noted that over the last week or 2, she has gained about 7 pounds and she is not really sure how.  She says the edema has definitely gotten worse and her feet are really swollen.  The support stockings do help.  Her blood pressures have also been up and down a lot and she is concerned about them having increase her carvedilol dose to 12.5 mg.  She did not take the higher dose until she came here to see me.  She has not had any lightheadedness or dizziness.  No syncope or near syncope.  No chest pain or pressure.  She does have dyspnea which has gotten worse over the last couple days but is not that much above baseline.  CV Review of Symptoms (Summary) Cardiovascular ROS: positive for - dyspnea on exertion, edema and A lot of the dyspnea with exertion is related to deconditioning, but now has gotten worse with wgt gain & edema  negative for - chest pain, irregular heartbeat, orthopnea, palpitations, paroxysmal nocturnal dyspnea, rapid heart rate, shortness of breath or syncope/near syncope; TIA/amaurosis fugax.Marland Kitchen Not walking enough to note claudication  The patient does not have symptoms concerning for COVID-19 infection (fever, chills, cough, or new shortness of breath).  The patient is practicing social distancing & Masking.   She has completed her COVID-19 vaccine injections.  REVIEWED OF SYSTEMS   Review of Systems  Constitutional: Positive for malaise/fatigue (Actually getting stronger postop) and weight loss (She did lose weight in the hospital, but has not gained back 7 pounds).  HENT: Negative for congestion and nosebleeds.   Respiratory: Positive for shortness of breath (A little bit more than usual).   Gastrointestinal: Negative for blood in stool and melena.  Genitourinary: Negative for hematuria.  Musculoskeletal: Positive for joint pain and neck pain. Negative for falls.  Neurological:  Negative for dizziness.  Psychiatric/Behavioral: Negative for memory loss. The patient is not nervous/anxious (Seems stable).     I have reviewed and (if needed) personally updated the patient's problem list, medications, allergies, past medical and surgical history, social and family history.   PAST MEDICAL HISTORY   Past Medical History:  Diagnosis Date  . Alcohol abuse 04/25/2015   reportedly drinks ~ 1 bottle of wine / day  . Allergy   . Anastomotic ulcer S/P gastric bypass 06/18/2016  . Arthritis   . Cataract   . Chronic diastolic heart failure (HCC) 10/25/2017  . Degenerative disc disease, cervical   . GAD (generalized anxiety disorder) 06/14/2016  . HNP (herniated nucleus pulposus) with myelopathy, cervical 05/27/2020   Cervical Myelopathy with Spinal cord compression (HCC)  --> s/p Anterior Cervical Discectomy Decompression)   . HTN (hypertension) 04/25/2015  . Hypothyroidism   . 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 UGIB (upper gastrointestinal bleed)  04/25/2015  . Venous stasis of both lower extremities 01/10/2019    PAST SURGICAL HISTORY   Past Surgical History:  Procedure Laterality Date  . ANTERIOR CERVICAL DECOMP/DISCECTOMY FUSION Left 05/26/2020   Procedure: Cervical seven - Thoracic one ANTERIOR CERVICAL DISCECTOMY AND PLATING;  Surgeon: Tressie Stalker, MD;  [facility] - Inpatient OR;  Service: Neurosurgery;  Laterality: Left;  . CHOLECYSTECTOMY    . COSMETIC SURGERY    . DIAGNOSTIC LAPAROSCOPY    . ESOPHAGOGASTRODUODENOSCOPY N/A 04/25/2015   Procedure: ESOPHAGOGASTRODUODENOSCOPY (EGD);  Surgeon: Beverley Fiedler, MD;  Location: [facility] ENDOSCOPY;  Service: Endoscopy;  Laterality: N/A;  . FRACTURE SURGERY    . GASTRIC BYPASS    . NM MYOVIEW LTD  10/10/2017   Normal LV size and function EF 72%.  Medium sized mild severity defect in the apical septal apical lateral and apical wall suggestive of breast attenuation.  LOW RISK.  No  ischemia or infarction.    . OPEN REDUCTION INTERNAL FIXATION (ORIF) DISTAL RADIAL FRACTURE Right 01/15/2013   Procedure: OPEN REDUCTION INTERNAL FIXATION (ORIF) Right DISTAL RADIUS FRACTURE;  Surgeon: Eldred Manges, MD;  Location: [facility] OR;  Service: Orthopedics;  Laterality: Right;  . TRANSTHORACIC ECHOCARDIOGRAM  10/04/2017   Mild LVH.  EF 60-65%.  Grade 2/moderate diastolic dysfunction.  Mild pulmonary hypertension.  No valve lesions    MEDICATIONS/ALLERGIES   Current Meds  Medication Sig  . acetaminophen (TYLENOL) 325 MG tablet Take 2 tablets (650 mg total) by mouth every 4 (four) hours as needed for mild pain ((score 1 to 3) or temp > 100.5).  Marland Kitchen benazepril-hydrochlorthiazide (LOTENSIN HCT) 20-25 MG tablet Take 1 tablet by mouth daily.  . carvedilol (COREG) 6.25 MG tablet Take 1 tablet (6.25 mg total) by mouth 2 (two) times daily with a meal.  . Cholecalciferol (VITAMIN D3) 125 MCG (5000 UT) TABS Take 3 tablets (15,000 Units total) by mouth See admin instructions. Takes 61950 units 5 days a week and 20000 units on 2 days.  . Cyanocobalamin (VITAMIN B 12) 100 MCG LOZG Take 2,500 mg by mouth once a week.  . cyclobenzaprine (FLEXERIL) 10 MG tablet Take 1 tablet (10 mg total) by mouth daily as needed for muscle spasms. .  . ferrous sulfate 325 (65 FE) MG tablet Take 1 tablet (325 mg total) by mouth daily with breakfast.  . furosemide (LASIX) 20 MG tablet Take 1 tablet (20 mg total) by mouth daily.  Marland Kitchen gabapentin (NEURONTIN) 300 MG capsule Take 300 mg by mouth See admin instructions. Take 5 capsules in the morning and 4 capsules at bedtime.   Marland Kitchen levothyroxine (SYNTHROID) 125 MCG tablet Only take 1 tab Tues and Fridays (Patient taking differently: Take 125 mcg by mouth See admin instructions. Take 1 tablet by mouth on Tuesday and Fridays)  . levothyroxine (SYNTHROID) 150 MCG tablet everyday except Tues and Friday- take those days (Patient taking differently: Take 150 mcg by mouth See admin  instructions. Takes everyday except Tues and Friday)  . meloxicam (MOBIC) 15 MG tablet Take 1 tablet (15 mg total) by mouth daily.  . Oxycodone HCl 10 MG TABS Take 1 tablet (10 mg total) by mouth every 3 (three) hours as needed ((score 7 to 10)).  Marland Kitchen pantoprazole (PROTONIX) 40 MG tablet Take 1 tablet (40 mg total) by mouth daily.  . rivaroxaban (XARELTO) 20 MG TABS tablet Take 1 tablet (20 mg total) by mouth daily.  . rosuvastatin (CRESTOR) 5 MG tablet Take 1 tablet (5 mg total) by mouth  daily.  . [DISCONTINUED] carvedilol (COREG) 12.5 MG tablet Take 1 tablet (12.5 mg total) by mouth 2 (two) times daily with a meal.    Allergies  Allergen Reactions  . Cocoa Anaphylaxis  . Red Dye Hives    Rash and Nausea diarrhea  . Lyrica [Pregabalin] Other (See Comments)    Abnormal bleeding  . Other Other (See Comments)    Berries : Rash  . Sulfa Antibiotics Rash    SOCIAL HISTORY/FAMILY HISTORY   Social History   Tobacco Use  . Smoking status: Never Smoker  . Smokeless tobacco: Never Used  Vaping Use  . Vaping Use: Never used  Substance Use Topics  . Alcohol use: Yes    Alcohol/week: 28.0 standard drinks    Types: 28 Glasses of wine per week    Comment: drinks daily and has a bottle of wine per day  . 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  Husband assists with all the ADLs.   family history includes Breast cancer in her sister; Breast cancer (age of onset: 56) in her mother; COPD in her father; Cancer in her father and mother; Cancer (age of onset: 63) in her cousin; Diabetes in her father; Heart disease in her mother; Hypertension in her father and mother; Mental illness in her maternal grandmother; Stroke in her maternal grandmother; Stroke  (age of onset: 40) in her mother.   OBJCTIVE -PE, EKG, labs   Wt Readings from Last 3 Encounters:  06/25/20 (!) 229 lb 6.4 oz (104.1 kg)  06/21/20 (!) 228 lb (103.4 kg)  05/29/20 232 lb 5.8 oz (105.4 kg)    Physical Exam: BP (!) 150/88   Pulse 82   Ht 5' 5.5" (1.664 m)   Wt (!) 229 lb 6.4 oz (104.1 kg)   SpO2 96%   BMI 37.59 kg/m  Physical Exam Constitutional:      General: She is not in acute distress.    Appearance: Normal appearance. She is obese. She is ill-appearing (Chronically ill-appearing.  Nontoxic).     Comments: Obese.  HENT:     Head: Normocephalic and atraumatic.  Neck:     Vascular: No hepatojugular reflux.     Comments: C-collar in place (unable to assess JVP or carotid bruit) Cardiovascular:     Rate and Rhythm: Normal rate and regular rhythm.  No extrasystoles are present.    Chest Wall: PMI is not displaced (Unable to palpate).     Pulses: Decreased pulses (Unable to palpate because of edema.).     Heart sounds: S1 normal and S2 normal. Heart sounds are distant. Murmur (Barely audible 1/6 SEM at RUSB.) heard.   Pulmonary:     Effort: Pulmonary effort is normal. No respiratory distress.     Breath sounds: Rales (Minimal bibasilar) present. No wheezing or rhonchi.  Chest:     Chest wall: No tenderness.  Abdominal:     General: Bowel sounds are normal. There is no distension.     Palpations: Abdomen is soft.     Tenderness: There is no abdominal tenderness.     Comments: Obese.  Unable to assess HSM  Musculoskeletal:  General: Swelling (Tense bilateral pitting edema roughly 2-3+ up to knees.  Mostly noted in the feet.  Purple violaceous venous stasis changes.  She is wearing support stockings with the feet cut out.) present. Normal range of motion.     Cervical back: Rigidity (C Collar) present.     Right lower leg: Edema present.     Left lower leg: Edema present.  Neurological:     Mental Status: She is alert and oriented to person, place,  and time.     Comments: Not tested because of c-collar  Psychiatric:        Mood and Affect: Mood normal.        Behavior: Behavior normal.        Thought Content: Thought content normal.        Judgment: Judgment normal.     Adult ECG Report N/A  Recent Labs:   Lab Results  Component Value Date   CHOL 131 02/16/2020   HDL 67 02/16/2020   LDLCALC 36 02/16/2020   LDLDIRECT 39 11/03/2019   TRIG 174 (H) 02/16/2020   CHOLHDL 2.0 02/16/2020   Lab Results  Component Value Date   CREATININE 0.91 05/31/2020   BUN 25 (H) 05/31/2020   NA 135 05/31/2020   K 4.3 05/31/2020   CL 97 (L) 05/31/2020   CO2 29 05/31/2020   Lab Results  Component Value Date   TSH 1.380 02/16/2020   Lab Results  Component Value Date   HGBA1C 5.3 05/01/2019    ASSESSMENT/PLAN    Problem List Items Addressed This Visit    Essential hypertension (Chronic)    For now continue with current medications.  I will go back to the 6.25 mg twice daily of carvedilol.  We can reevaluate her pressures when she is seen in follow-up.      Relevant Medications   carvedilol (COREG) 6.25 MG tablet   HLD (hyperlipidemia) (Chronic)    As of March looks great on rosuvastatin.  Continue current dose.  She does have some hypertriglyceridemia which is likely related to dietary indiscretions      Relevant Medications   carvedilol (COREG) 6.25 MG tablet   Venous stasis of both lower extremities (Chronic)    She deftly has venous stasis.  She is now wearing her support stockings.  We talked the importance of foot elevation and foot exercising.      Relevant Medications   carvedilol (COREG) 6.25 MG tablet   Acute on chronic diastolic heart failure (HCC) - Primary    Interesting, her dry weight seems is decreased.  She says she is 7 pounds up from what she was when she went home.  This would indicate that she has lost some of the weight she gained.  She tells definitely looks like she has.  Plan for now will be to  ramp up her diuretic dosing until she gets back down to her baseline weight which is 7 pounds down from today.  Recommendation:  Furosemide as follows: 3 days at 40 mg daily followed by 2 days at 20 mg daily until back to baseline.  Once a baseline weight return to 20 mg daily.  Follow-up labs in 1 to 2 weeks.  She has not been taking carvedilol because she was not sure what dose to take.  I am and have her go back to taking 6.25 mg twice daily and reassess on follow-up.  (She never took the 12.5 mg twice daily dosing)  She is on benazepril  have been HCTZ -> my plan had been to switch to simply benazepril with her being on standing dose of diuretic in order to avoid issues with hypokalemia. ->  As her blood pressure stabilizes, we can consider removing the HCTZ component.      Relevant Medications   carvedilol (COREG) 6.25 MG tablet    Other Visit Diagnoses    Medication management       Relevant Orders   Basic metabolic panel       COVID-19 Education: The signs and symptoms of COVID-19 were discussed with the patient and how to seek care for testing (follow up with PCP or arrange E-visit).   The importance of social distancing and COVID-19 vaccination was discussed today.  I spent a total of 28 minutes with the patient. >  50% of the time was spent in direct patient consultation. -->  She went through a detailed explanation of her hospitalization and complex and desires Additional time spent with chart review  / charting (studies, outside notes, etc): 16 --> Previous clinic visits and prolonged hospitalization reviewed.  Understanding medication adjustments.  Total Time: 42min   Current medicines are reviewed at length with the patient today.  (+/- concerns) concerned about the increased dose of carvedilol.  Notice: This dictation was prepared with Dragon dictation along with smaller phrase technology. Any transcriptional errors that result from this process are unintentional  and may not be corrected upon review.  Patient Instructions / Medication Changes & Studies & Tests Ordered   Patient Instructions  Medication Instructions:   INCREASE Lasix to 40 mg 3 days, 20 mg 2 days   TAKE Carvedilol (Coreg) 6.25 2 times a day *If you need a refill on your cardiac medications before your next appointment, please call your pharmacy*  Lab Work: Your physician recommends that you return for lab work in 2 weeks:  BMET  If you have labs (blood work) drawn today and your tests are completely normal, you will receive your results only by: Marland Kitchen. MyChart Message (if you have MyChart) OR . A paper copy in the mail If you have any lab test that is abnormal or we need to change your treatment, we will call you to review the results.  Testing/Procedures: NONE ordered at this time of appointment   Follow-Up: At [facility], you and your health needs are our priority.  As part of our continuing mission to provide you with exceptional heart care, we have created designated Provider Care Teams.  These Care Teams include your primary Cardiologist (physician) and Advanced Practice Providers (APPs -  Physician Assistants and Nurse Practitioners) who all work together to provide you with the care you need, when you need it.  Your next appointment:   Follow up as scheduled 07/28/2020  The format for your next appointment:   In Person  Provider:   Bryan Lemmaavid Jaliel Deavers, MD  Other Instructions      Studies Ordered:   Orders Placed This Encounter  Procedures  . Basic metabolic panel     Bryan Lemmaavid Nataya Bastedo, M.D., M.S. Interventional Cardiologist   Pager # [phone] Phone # [phone] [address] [place], [place] [address]   Thank you for choosing [facility] at [facility]!!

## 2020-06-25 NOTE — Telephone Encounter (Signed)
Routed to Herbie Baltimore MD & Gaspar Cola CMA as patient has appointment today @ 4:20pm

## 2020-06-25 NOTE — Telephone Encounter (Signed)
Pt c/o swelling: STAT is pt has developed SOB within 24 hours  1) How much weight have you gained and in what time span?  7 lbs overnight   2) If swelling, where is the swelling located? L foot   3) Are you currently taking a fluid pill? yes  4) Are you currently SOB? A little byt pt was able to do all exercises with  HH[facility]  5) Do you have a log of your daily weights (if so, list)?   06/24/20 223 lbs   06/25/20 230 lbs  6) Have you gained 3 pounds in a day or 5 pounds in a week? 7 lbs overnight  7) Have you traveled recently? No   [facility] PT wanted to make Dr. Herbie Baltimore aware of the weight gain and the SOB while doing activity. The [facility] PT is glad she is going in to see Dr. Herbie Baltimore today

## 2020-06-28 DIAGNOSIS — I5030 Unspecified diastolic (congestive) heart failure: Secondary | ICD-10-CM | POA: Diagnosis not present

## 2020-06-28 DIAGNOSIS — E039 Hypothyroidism, unspecified: Secondary | ICD-10-CM | POA: Diagnosis not present

## 2020-06-28 DIAGNOSIS — M47816 Spondylosis without myelopathy or radiculopathy, lumbar region: Secondary | ICD-10-CM | POA: Diagnosis not present

## 2020-06-28 DIAGNOSIS — M47812 Spondylosis without myelopathy or radiculopathy, cervical region: Secondary | ICD-10-CM | POA: Diagnosis not present

## 2020-06-28 DIAGNOSIS — D509 Iron deficiency anemia, unspecified: Secondary | ICD-10-CM | POA: Diagnosis not present

## 2020-06-28 DIAGNOSIS — M48061 Spinal stenosis, lumbar region without neurogenic claudication: Secondary | ICD-10-CM | POA: Diagnosis not present

## 2020-06-28 DIAGNOSIS — I110 Hypertensive heart disease with heart failure: Secondary | ICD-10-CM | POA: Diagnosis not present

## 2020-06-28 DIAGNOSIS — M5023 Other cervical disc displacement, cervicothoracic region: Secondary | ICD-10-CM | POA: Diagnosis not present

## 2020-06-28 DIAGNOSIS — Z4789 Encounter for other orthopedic aftercare: Secondary | ICD-10-CM | POA: Diagnosis not present

## 2020-06-29 DIAGNOSIS — D509 Iron deficiency anemia, unspecified: Secondary | ICD-10-CM | POA: Diagnosis not present

## 2020-06-29 DIAGNOSIS — I5030 Unspecified diastolic (congestive) heart failure: Secondary | ICD-10-CM | POA: Diagnosis not present

## 2020-06-29 DIAGNOSIS — Z4789 Encounter for other orthopedic aftercare: Secondary | ICD-10-CM | POA: Diagnosis not present

## 2020-06-29 DIAGNOSIS — M47812 Spondylosis without myelopathy or radiculopathy, cervical region: Secondary | ICD-10-CM | POA: Diagnosis not present

## 2020-06-29 DIAGNOSIS — I110 Hypertensive heart disease with heart failure: Secondary | ICD-10-CM | POA: Diagnosis not present

## 2020-06-29 DIAGNOSIS — M5023 Other cervical disc displacement, cervicothoracic region: Secondary | ICD-10-CM | POA: Diagnosis not present

## 2020-06-29 DIAGNOSIS — M47816 Spondylosis without myelopathy or radiculopathy, lumbar region: Secondary | ICD-10-CM | POA: Diagnosis not present

## 2020-06-29 DIAGNOSIS — E039 Hypothyroidism, unspecified: Secondary | ICD-10-CM | POA: Diagnosis not present

## 2020-06-29 DIAGNOSIS — M48061 Spinal stenosis, lumbar region without neurogenic claudication: Secondary | ICD-10-CM | POA: Diagnosis not present

## 2020-06-30 ENCOUNTER — Other Ambulatory Visit: Payer: Self-pay | Admitting: Physical Medicine and Rehabilitation

## 2020-06-30 ENCOUNTER — Encounter: Payer: Self-pay | Admitting: Cardiology

## 2020-06-30 DIAGNOSIS — E039 Hypothyroidism, unspecified: Secondary | ICD-10-CM | POA: Diagnosis not present

## 2020-06-30 DIAGNOSIS — I110 Hypertensive heart disease with heart failure: Secondary | ICD-10-CM | POA: Diagnosis not present

## 2020-06-30 DIAGNOSIS — M47816 Spondylosis without myelopathy or radiculopathy, lumbar region: Secondary | ICD-10-CM | POA: Diagnosis not present

## 2020-06-30 DIAGNOSIS — M48061 Spinal stenosis, lumbar region without neurogenic claudication: Secondary | ICD-10-CM | POA: Diagnosis not present

## 2020-06-30 DIAGNOSIS — M5023 Other cervical disc displacement, cervicothoracic region: Secondary | ICD-10-CM | POA: Diagnosis not present

## 2020-06-30 DIAGNOSIS — D509 Iron deficiency anemia, unspecified: Secondary | ICD-10-CM | POA: Diagnosis not present

## 2020-06-30 DIAGNOSIS — Z4789 Encounter for other orthopedic aftercare: Secondary | ICD-10-CM | POA: Diagnosis not present

## 2020-06-30 DIAGNOSIS — I5030 Unspecified diastolic (congestive) heart failure: Secondary | ICD-10-CM | POA: Diagnosis not present

## 2020-06-30 DIAGNOSIS — M47812 Spondylosis without myelopathy or radiculopathy, cervical region: Secondary | ICD-10-CM | POA: Diagnosis not present

## 2020-06-30 MED ORDER — RIVAROXABAN 20 MG PO TABS: 20.0000 mg | ORAL_TABLET | Freq: Every day | ORAL | 1 refills | Status: DC

## 2020-06-30 NOTE — Assessment & Plan Note (Signed)
She deftly has venous stasis.  She is now wearing her support stockings.  We talked the importance of foot elevation and foot exercising.

## 2020-06-30 NOTE — Assessment & Plan Note (Signed)
For now continue with current medications.  I will go back to the 6.25 mg twice daily of carvedilol.  We can reevaluate her pressures when she is seen in follow-up.

## 2020-06-30 NOTE — Assessment & Plan Note (Signed)
As of March looks great on rosuvastatin.  Continue current dose.  She does have some hypertriglyceridemia which is likely related to dietary indiscretions

## 2020-06-30 NOTE — Assessment & Plan Note (Signed)
>>  ASSESSMENT AND PLAN FOR VENOUS STASIS OF BOTH LOWER EXTREMITIES WRITTEN ON 06/30/2020  1:34 AM BY HARDING, Piedad Climes, MD  She deftly has venous stasis.  She is now wearing her support stockings.  We talked the importance of foot elevation and foot exercising.

## 2020-06-30 NOTE — Assessment & Plan Note (Addendum)
Interesting, her dry weight seems is decreased.  She says she is 7 pounds up from what she was when she went home.  This would indicate that she has lost some of the weight she gained.  She tells definitely looks like she has.  Plan for now will be to ramp up her diuretic dosing until she gets back down to her baseline weight which is 7 pounds down from today.  Recommendation:  Furosemide as follows: 3 days at 40 mg daily followed by 2 days at 20 mg daily until back to baseline.  Once a baseline weight return to 20 mg daily.  Follow-up labs in 1 to 2 weeks.  She has not been taking carvedilol because she was not sure what dose to take.  I am and have her go back to taking 6.25 mg twice daily and reassess on follow-up.  (She never took the 12.5 mg twice daily dosing)  She is on benazepril have been HCTZ -> my plan had been to switch to simply benazepril with her being on standing dose of diuretic in order to avoid issues with hypokalemia. ->  As her blood pressure stabilizes, we can consider removing the HCTZ component.

## 2020-07-01 DIAGNOSIS — M47816 Spondylosis without myelopathy or radiculopathy, lumbar region: Secondary | ICD-10-CM | POA: Diagnosis not present

## 2020-07-01 DIAGNOSIS — M47812 Spondylosis without myelopathy or radiculopathy, cervical region: Secondary | ICD-10-CM | POA: Diagnosis not present

## 2020-07-01 DIAGNOSIS — M48061 Spinal stenosis, lumbar region without neurogenic claudication: Secondary | ICD-10-CM | POA: Diagnosis not present

## 2020-07-01 DIAGNOSIS — M5023 Other cervical disc displacement, cervicothoracic region: Secondary | ICD-10-CM | POA: Diagnosis not present

## 2020-07-01 DIAGNOSIS — E039 Hypothyroidism, unspecified: Secondary | ICD-10-CM | POA: Diagnosis not present

## 2020-07-01 DIAGNOSIS — I5030 Unspecified diastolic (congestive) heart failure: Secondary | ICD-10-CM | POA: Diagnosis not present

## 2020-07-01 DIAGNOSIS — I110 Hypertensive heart disease with heart failure: Secondary | ICD-10-CM | POA: Diagnosis not present

## 2020-07-01 DIAGNOSIS — D509 Iron deficiency anemia, unspecified: Secondary | ICD-10-CM | POA: Diagnosis not present

## 2020-07-01 DIAGNOSIS — Z4789 Encounter for other orthopedic aftercare: Secondary | ICD-10-CM | POA: Diagnosis not present

## 2020-07-05 DIAGNOSIS — M47812 Spondylosis without myelopathy or radiculopathy, cervical region: Secondary | ICD-10-CM | POA: Diagnosis not present

## 2020-07-05 DIAGNOSIS — H259 Unspecified age-related cataract: Secondary | ICD-10-CM

## 2020-07-05 DIAGNOSIS — I110 Hypertensive heart disease with heart failure: Secondary | ICD-10-CM | POA: Diagnosis not present

## 2020-07-05 DIAGNOSIS — F329 Major depressive disorder, single episode, unspecified: Secondary | ICD-10-CM

## 2020-07-05 DIAGNOSIS — Z6837 Body mass index (BMI) 37.0-37.9, adult: Secondary | ICD-10-CM

## 2020-07-05 DIAGNOSIS — M47816 Spondylosis without myelopathy or radiculopathy, lumbar region: Secondary | ICD-10-CM | POA: Diagnosis not present

## 2020-07-05 DIAGNOSIS — N2889 Other specified disorders of kidney and ureter: Secondary | ICD-10-CM

## 2020-07-05 DIAGNOSIS — F411 Generalized anxiety disorder: Secondary | ICD-10-CM

## 2020-07-05 DIAGNOSIS — M5023 Other cervical disc displacement, cervicothoracic region: Secondary | ICD-10-CM | POA: Diagnosis not present

## 2020-07-05 DIAGNOSIS — M48061 Spinal stenosis, lumbar region without neurogenic claudication: Secondary | ICD-10-CM | POA: Diagnosis not present

## 2020-07-05 DIAGNOSIS — E669 Obesity, unspecified: Secondary | ICD-10-CM

## 2020-07-05 DIAGNOSIS — I5030 Unspecified diastolic (congestive) heart failure: Secondary | ICD-10-CM | POA: Diagnosis not present

## 2020-07-05 DIAGNOSIS — Z9884 Bariatric surgery status: Secondary | ICD-10-CM

## 2020-07-05 DIAGNOSIS — M160 Bilateral primary osteoarthritis of hip: Secondary | ICD-10-CM

## 2020-07-05 DIAGNOSIS — D509 Iron deficiency anemia, unspecified: Secondary | ICD-10-CM | POA: Diagnosis not present

## 2020-07-05 DIAGNOSIS — Z7901 Long term (current) use of anticoagulants: Secondary | ICD-10-CM

## 2020-07-05 DIAGNOSIS — Z981 Arthrodesis status: Secondary | ICD-10-CM

## 2020-07-05 DIAGNOSIS — Z4789 Encounter for other orthopedic aftercare: Secondary | ICD-10-CM | POA: Diagnosis not present

## 2020-07-05 DIAGNOSIS — Z9181 History of falling: Secondary | ICD-10-CM

## 2020-07-05 DIAGNOSIS — E039 Hypothyroidism, unspecified: Secondary | ICD-10-CM | POA: Diagnosis not present

## 2020-07-06 DIAGNOSIS — Z79899 Other long term (current) drug therapy: Secondary | ICD-10-CM | POA: Diagnosis not present

## 2020-07-07 DIAGNOSIS — I5030 Unspecified diastolic (congestive) heart failure: Secondary | ICD-10-CM | POA: Diagnosis not present

## 2020-07-07 DIAGNOSIS — Z4789 Encounter for other orthopedic aftercare: Secondary | ICD-10-CM | POA: Diagnosis not present

## 2020-07-07 DIAGNOSIS — I110 Hypertensive heart disease with heart failure: Secondary | ICD-10-CM | POA: Diagnosis not present

## 2020-07-07 DIAGNOSIS — M48061 Spinal stenosis, lumbar region without neurogenic claudication: Secondary | ICD-10-CM | POA: Diagnosis not present

## 2020-07-07 DIAGNOSIS — M47816 Spondylosis without myelopathy or radiculopathy, lumbar region: Secondary | ICD-10-CM | POA: Diagnosis not present

## 2020-07-07 DIAGNOSIS — E039 Hypothyroidism, unspecified: Secondary | ICD-10-CM | POA: Diagnosis not present

## 2020-07-07 DIAGNOSIS — M47812 Spondylosis without myelopathy or radiculopathy, cervical region: Secondary | ICD-10-CM | POA: Diagnosis not present

## 2020-07-07 DIAGNOSIS — D509 Iron deficiency anemia, unspecified: Secondary | ICD-10-CM | POA: Diagnosis not present

## 2020-07-07 DIAGNOSIS — M5023 Other cervical disc displacement, cervicothoracic region: Secondary | ICD-10-CM | POA: Diagnosis not present

## 2020-07-07 LAB — BASIC METABOLIC PANEL
BUN/Creatinine Ratio: 18 (ref 12–28)
BUN: 22 mg/dL (ref 8–27)
CO2: 29 mmol/L (ref 20–29)
Calcium: 9.3 mg/dL (ref 8.7–10.3)
Chloride: 99 mmol/L (ref 96–106)
Creatinine, Ser: 1.19 mg/dL — ABNORMAL HIGH (ref 0.57–1.00)
GFR calc Af Amer: 52 mL/min/{1.73_m2} — ABNORMAL LOW (ref >59)
GFR calc non Af Amer: 45 mL/min/{1.73_m2} — ABNORMAL LOW (ref >59)
Glucose: 94 mg/dL (ref 65–99)
Potassium: 4.7 mmol/L (ref 3.5–5.2)
Sodium: 138 mmol/L (ref 134–144)

## 2020-07-09 DIAGNOSIS — M47816 Spondylosis without myelopathy or radiculopathy, lumbar region: Secondary | ICD-10-CM | POA: Diagnosis not present

## 2020-07-09 DIAGNOSIS — Z4789 Encounter for other orthopedic aftercare: Secondary | ICD-10-CM | POA: Diagnosis not present

## 2020-07-09 DIAGNOSIS — E039 Hypothyroidism, unspecified: Secondary | ICD-10-CM | POA: Diagnosis not present

## 2020-07-09 DIAGNOSIS — I5030 Unspecified diastolic (congestive) heart failure: Secondary | ICD-10-CM | POA: Diagnosis not present

## 2020-07-09 DIAGNOSIS — I110 Hypertensive heart disease with heart failure: Secondary | ICD-10-CM | POA: Diagnosis not present

## 2020-07-09 DIAGNOSIS — M48061 Spinal stenosis, lumbar region without neurogenic claudication: Secondary | ICD-10-CM | POA: Diagnosis not present

## 2020-07-09 DIAGNOSIS — M47812 Spondylosis without myelopathy or radiculopathy, cervical region: Secondary | ICD-10-CM | POA: Diagnosis not present

## 2020-07-09 DIAGNOSIS — M5023 Other cervical disc displacement, cervicothoracic region: Secondary | ICD-10-CM | POA: Diagnosis not present

## 2020-07-09 DIAGNOSIS — D509 Iron deficiency anemia, unspecified: Secondary | ICD-10-CM | POA: Diagnosis not present

## 2020-07-12 DIAGNOSIS — M5023 Other cervical disc displacement, cervicothoracic region: Secondary | ICD-10-CM | POA: Diagnosis not present

## 2020-07-12 DIAGNOSIS — Z4789 Encounter for other orthopedic aftercare: Secondary | ICD-10-CM | POA: Diagnosis not present

## 2020-07-12 DIAGNOSIS — I110 Hypertensive heart disease with heart failure: Secondary | ICD-10-CM | POA: Diagnosis not present

## 2020-07-12 DIAGNOSIS — E039 Hypothyroidism, unspecified: Secondary | ICD-10-CM | POA: Diagnosis not present

## 2020-07-12 DIAGNOSIS — I5030 Unspecified diastolic (congestive) heart failure: Secondary | ICD-10-CM | POA: Diagnosis not present

## 2020-07-12 DIAGNOSIS — M47812 Spondylosis without myelopathy or radiculopathy, cervical region: Secondary | ICD-10-CM | POA: Diagnosis not present

## 2020-07-12 DIAGNOSIS — M47816 Spondylosis without myelopathy or radiculopathy, lumbar region: Secondary | ICD-10-CM | POA: Diagnosis not present

## 2020-07-12 DIAGNOSIS — M48061 Spinal stenosis, lumbar region without neurogenic claudication: Secondary | ICD-10-CM | POA: Diagnosis not present

## 2020-07-12 DIAGNOSIS — D509 Iron deficiency anemia, unspecified: Secondary | ICD-10-CM | POA: Diagnosis not present

## 2020-07-13 ENCOUNTER — Other Ambulatory Visit: Payer: Self-pay | Admitting: Sports Medicine

## 2020-07-13 DIAGNOSIS — Z4789 Encounter for other orthopedic aftercare: Secondary | ICD-10-CM | POA: Diagnosis not present

## 2020-07-13 DIAGNOSIS — M47812 Spondylosis without myelopathy or radiculopathy, cervical region: Secondary | ICD-10-CM | POA: Diagnosis not present

## 2020-07-13 DIAGNOSIS — M48061 Spinal stenosis, lumbar region without neurogenic claudication: Secondary | ICD-10-CM | POA: Diagnosis not present

## 2020-07-13 DIAGNOSIS — E039 Hypothyroidism, unspecified: Secondary | ICD-10-CM | POA: Diagnosis not present

## 2020-07-13 DIAGNOSIS — I5030 Unspecified diastolic (congestive) heart failure: Secondary | ICD-10-CM | POA: Diagnosis not present

## 2020-07-13 DIAGNOSIS — M47816 Spondylosis without myelopathy or radiculopathy, lumbar region: Secondary | ICD-10-CM | POA: Diagnosis not present

## 2020-07-13 DIAGNOSIS — D509 Iron deficiency anemia, unspecified: Secondary | ICD-10-CM | POA: Diagnosis not present

## 2020-07-13 DIAGNOSIS — M5023 Other cervical disc displacement, cervicothoracic region: Secondary | ICD-10-CM | POA: Diagnosis not present

## 2020-07-13 DIAGNOSIS — I110 Hypertensive heart disease with heart failure: Secondary | ICD-10-CM | POA: Diagnosis not present

## 2020-07-14 ENCOUNTER — Other Ambulatory Visit: Payer: Self-pay | Admitting: Family Medicine

## 2020-07-14 DIAGNOSIS — I10 Essential (primary) hypertension: Secondary | ICD-10-CM

## 2020-07-16 ENCOUNTER — Telehealth: Payer: Self-pay | Admitting: Physician Assistant

## 2020-07-16 DIAGNOSIS — Z4789 Encounter for other orthopedic aftercare: Secondary | ICD-10-CM | POA: Diagnosis not present

## 2020-07-16 DIAGNOSIS — M47812 Spondylosis without myelopathy or radiculopathy, cervical region: Secondary | ICD-10-CM | POA: Diagnosis not present

## 2020-07-16 DIAGNOSIS — M48061 Spinal stenosis, lumbar region without neurogenic claudication: Secondary | ICD-10-CM | POA: Diagnosis not present

## 2020-07-16 DIAGNOSIS — M5023 Other cervical disc displacement, cervicothoracic region: Secondary | ICD-10-CM | POA: Diagnosis not present

## 2020-07-16 DIAGNOSIS — I5030 Unspecified diastolic (congestive) heart failure: Secondary | ICD-10-CM | POA: Diagnosis not present

## 2020-07-16 DIAGNOSIS — M47816 Spondylosis without myelopathy or radiculopathy, lumbar region: Secondary | ICD-10-CM | POA: Diagnosis not present

## 2020-07-16 DIAGNOSIS — D509 Iron deficiency anemia, unspecified: Secondary | ICD-10-CM | POA: Diagnosis not present

## 2020-07-16 DIAGNOSIS — E039 Hypothyroidism, unspecified: Secondary | ICD-10-CM | POA: Diagnosis not present

## 2020-07-16 DIAGNOSIS — I110 Hypertensive heart disease with heart failure: Secondary | ICD-10-CM | POA: Diagnosis not present

## 2020-07-16 NOTE — Telephone Encounter (Signed)
Patient recently given refill 7/21. Patient is aware. AS, CMA

## 2020-07-16 NOTE — Telephone Encounter (Signed)
Patient is requesting a refill of her benazepril, the order has been pending for several days as it went to previous PCP, if approved please send to [facility]

## 2020-07-21 ENCOUNTER — Telehealth: Payer: Self-pay

## 2020-07-21 DIAGNOSIS — D509 Iron deficiency anemia, unspecified: Secondary | ICD-10-CM | POA: Diagnosis not present

## 2020-07-21 DIAGNOSIS — I5030 Unspecified diastolic (congestive) heart failure: Secondary | ICD-10-CM | POA: Diagnosis not present

## 2020-07-21 DIAGNOSIS — I110 Hypertensive heart disease with heart failure: Secondary | ICD-10-CM | POA: Diagnosis not present

## 2020-07-21 DIAGNOSIS — M47812 Spondylosis without myelopathy or radiculopathy, cervical region: Secondary | ICD-10-CM | POA: Diagnosis not present

## 2020-07-21 DIAGNOSIS — M48061 Spinal stenosis, lumbar region without neurogenic claudication: Secondary | ICD-10-CM | POA: Diagnosis not present

## 2020-07-21 DIAGNOSIS — M47816 Spondylosis without myelopathy or radiculopathy, lumbar region: Secondary | ICD-10-CM | POA: Diagnosis not present

## 2020-07-21 DIAGNOSIS — M5023 Other cervical disc displacement, cervicothoracic region: Secondary | ICD-10-CM | POA: Diagnosis not present

## 2020-07-21 DIAGNOSIS — Z4789 Encounter for other orthopedic aftercare: Secondary | ICD-10-CM | POA: Diagnosis not present

## 2020-07-21 DIAGNOSIS — E039 Hypothyroidism, unspecified: Secondary | ICD-10-CM | POA: Diagnosis not present

## 2020-07-21 NOTE — Telephone Encounter (Signed)
Tresa Endo RN with [facility] has requested a verbal order for speech therapy.  [name] has episodes of choking & difficulty swallowing since surgery. Please advise.   (If granted please call Eula Listen ph# [phone]).  Thank you.

## 2020-07-22 NOTE — Telephone Encounter (Signed)
Called and gave them order for SLP- thank you

## 2020-07-23 ENCOUNTER — Telehealth: Payer: Self-pay

## 2020-07-23 DIAGNOSIS — E039 Hypothyroidism, unspecified: Secondary | ICD-10-CM | POA: Diagnosis not present

## 2020-07-23 DIAGNOSIS — M47816 Spondylosis without myelopathy or radiculopathy, lumbar region: Secondary | ICD-10-CM | POA: Diagnosis not present

## 2020-07-23 DIAGNOSIS — M5023 Other cervical disc displacement, cervicothoracic region: Secondary | ICD-10-CM | POA: Diagnosis not present

## 2020-07-23 DIAGNOSIS — R498 Other voice and resonance disorders: Secondary | ICD-10-CM

## 2020-07-23 DIAGNOSIS — D509 Iron deficiency anemia, unspecified: Secondary | ICD-10-CM | POA: Diagnosis not present

## 2020-07-23 DIAGNOSIS — I110 Hypertensive heart disease with heart failure: Secondary | ICD-10-CM | POA: Diagnosis not present

## 2020-07-23 DIAGNOSIS — Z4789 Encounter for other orthopedic aftercare: Secondary | ICD-10-CM | POA: Diagnosis not present

## 2020-07-23 DIAGNOSIS — M47812 Spondylosis without myelopathy or radiculopathy, cervical region: Secondary | ICD-10-CM | POA: Diagnosis not present

## 2020-07-23 DIAGNOSIS — M48061 Spinal stenosis, lumbar region without neurogenic claudication: Secondary | ICD-10-CM | POA: Diagnosis not present

## 2020-07-23 DIAGNOSIS — I5030 Unspecified diastolic (congestive) heart failure: Secondary | ICD-10-CM | POA: Diagnosis not present

## 2020-07-23 NOTE — Telephone Encounter (Signed)
Diedre from [facility] called for VO for HHST 1wk1, 2wk2 for Dysphagia and requesting ENT referral for bad vocal quality.

## 2020-07-23 NOTE — Telephone Encounter (Signed)
Referral has been inputed

## 2020-07-27 ENCOUNTER — Other Ambulatory Visit: Payer: Self-pay | Admitting: Sports Medicine

## 2020-07-27 DIAGNOSIS — M170 Bilateral primary osteoarthritis of knee: Secondary | ICD-10-CM

## 2020-07-28 ENCOUNTER — Other Ambulatory Visit: Payer: Self-pay

## 2020-07-28 ENCOUNTER — Ambulatory Visit: Payer: Medicare PPO | Admitting: Cardiology

## 2020-07-28 ENCOUNTER — Encounter: Payer: Self-pay | Admitting: Cardiology

## 2020-07-28 DIAGNOSIS — M48061 Spinal stenosis, lumbar region without neurogenic claudication: Secondary | ICD-10-CM | POA: Diagnosis not present

## 2020-07-28 DIAGNOSIS — I10 Essential (primary) hypertension: Secondary | ICD-10-CM

## 2020-07-28 DIAGNOSIS — M5023 Other cervical disc displacement, cervicothoracic region: Secondary | ICD-10-CM | POA: Diagnosis not present

## 2020-07-28 DIAGNOSIS — I5030 Unspecified diastolic (congestive) heart failure: Secondary | ICD-10-CM | POA: Diagnosis not present

## 2020-07-28 DIAGNOSIS — G629 Polyneuropathy, unspecified: Secondary | ICD-10-CM | POA: Diagnosis not present

## 2020-07-28 DIAGNOSIS — D509 Iron deficiency anemia, unspecified: Secondary | ICD-10-CM | POA: Diagnosis not present

## 2020-07-28 DIAGNOSIS — E039 Hypothyroidism, unspecified: Secondary | ICD-10-CM | POA: Diagnosis not present

## 2020-07-28 DIAGNOSIS — M47816 Spondylosis without myelopathy or radiculopathy, lumbar region: Secondary | ICD-10-CM | POA: Diagnosis not present

## 2020-07-28 DIAGNOSIS — M47812 Spondylosis without myelopathy or radiculopathy, cervical region: Secondary | ICD-10-CM | POA: Diagnosis not present

## 2020-07-28 DIAGNOSIS — I878 Other specified disorders of veins: Secondary | ICD-10-CM

## 2020-07-28 DIAGNOSIS — Z4789 Encounter for other orthopedic aftercare: Secondary | ICD-10-CM | POA: Diagnosis not present

## 2020-07-28 DIAGNOSIS — M792 Neuralgia and neuritis, unspecified: Secondary | ICD-10-CM | POA: Diagnosis not present

## 2020-07-28 DIAGNOSIS — I110 Hypertensive heart disease with heart failure: Secondary | ICD-10-CM | POA: Diagnosis not present

## 2020-07-28 DIAGNOSIS — I5032 Chronic diastolic (congestive) heart failure: Secondary | ICD-10-CM

## 2020-07-28 DIAGNOSIS — E782 Mixed hyperlipidemia: Secondary | ICD-10-CM | POA: Diagnosis not present

## 2020-07-28 NOTE — Patient Instructions (Signed)
Medication Instructions:  Continue current medications  *If you need a refill on your cardiac medications before your next appointment, please call your pharmacy*   Lab Work: None Ordered   Testing/Procedures: None Ordered   Follow-Up: At [facility], you and your health needs are our priority.  As part of our continuing mission to provide you with exceptional heart care, we have created designated Provider Care Teams.  These Care Teams include your primary Cardiologist (physician) and Advanced Practice Providers (APPs -  Physician Assistants and Nurse Practitioners) who all work together to provide you with the care you need, when you need it.  We recommend signing up for the patient portal called "MyChart".  Sign up information is provided on this After Visit Summary.  MyChart is used to connect with patients for Virtual Visits (Telemedicine).  Patients are able to view lab/test results, encounter notes, upcoming appointments, etc.  Non-urgent messages can be sent to your provider as well.   To learn more about what you can do with MyChart, go to [email or web address].    Your next appointment:   6 month(s)  The format for your next appointment:   In Person  Provider:   You may see Bryan Lemma, MD or one of the following Advanced Practice Providers on your designated Care Team:    Theodore Demark, PA-C  Joni Reining, DNP, ANP  Cadence Fransico Michael, PA-C    Other Instructions Keep feet Elevated as much a possible Wear supporting stocking after healing Keep blood pressure log

## 2020-07-28 NOTE — Progress Notes (Signed)
Primary Care Provider: Mayer Masker, PA-C Cardiologist: Bryan Lemma, MD Electrophysiologist: None  Neurosurgeon: Dr. Lovell Sheehan  Clinic Note: Chief Complaint  Patient presents with  . Follow-up    1 month  . Congestive Heart Failure    Notably improved symptoms  . Edema    Notably improved    HPI:    [name] is a 74 y.o. female with a PMH noted below who presents for notable for roughly 1 month follow-up.Marland Kitchen    PMH: Chronic HF PEF with chronic LE edema complicated by venous insufficiency, HTN, HLD, obesity (h/o GOP), EtOH ABUSE (by report drinks a bottle of wine a day), h/o GIB    Telemedicine on March 4 to follow-up from this swelling/CHF exacerbation in February. -->  Her weight was back down to roughly 234 pounds.  Feeling a lot better.  Breathing, swelling and energy improved.  No longer having redness and warmth.  Continued on 20 mg Lasix daily with plans to take 40 mg daily along 2 days out of the week as standing dose as well as as needed for additional swelling.  Ace wraps or support stockings  She walks with a walker outside the house.  [name] was last seen on June 25, 2020 for hospital follow-up (see discussion below past medical history -> acute cord compression -> s/p surgical surgical repair, complicated by bilateral peroneal vein DVTs in rehab. --> Did fairly well cardiac rehab.  Volume standpoint was stable.  Was able to wear support hose with toes cut out.  Slowly improving every day.  Had gained about 7 pounds of the weight that she had lost, thinking that it was related to fluid buildup.--Edema much worse, legs and feet swollen and red. --> Was concerned because her carvedilol dose was increased to 12.5 mg twice daily in the CIR -> she was concerned about possible hypotension.  Recent Hospitalizations:   None in the last month  Reviewed  CV studies:    The following studies were reviewed today: (if available, images/films  reviewed: From Epic Chart or Care Everywhere) . n/a:   Interval History:   [name] presents here today in follow-up stating that overall she is feeling a whole lot better from a dyspnea standpoint --> edema notably improved.  Weight is down and been able maintain stable.  She has diuresed quite a bit but still has some swelling left in the left greater than right foot.  This may have to do with previous injury.  She has notably improved exertional dyspnea working with physical therapy.  Denies any PND or orthopnea.  She is wearing her support stockings most days.  Indicates that it does help.  No chest pain or pressure.  No regular heartbeats palpitations just a few skipped beats here and there.  No syncope or near syncope.  CV Review of Symptoms (Summary) Cardiovascular ROS: positive for - dyspnea on exertion, edema and Both are notably improved following diuresis. negative for - chest pain, irregular heartbeat, orthopnea, palpitations, paroxysmal nocturnal dyspnea, rapid heart rate, shortness of breath or Lightheadedness, dizziness or wooziness, syncope/near syncope or TIA/amaurosis fugax.  Not yet walking enough to notice claudication  The patient DOES NOT have symptoms concerning for COVID-19 infection (fever, chills, cough, or new shortness of breath).  The patient is practicing social distancing & Masking.   She has had both COVID-19 vaccine injections.  REVIEWED OF SYSTEMS   Review of Systems  Constitutional: Positive for malaise/fatigue (Actually getting stronger postop)  and weight loss (Maintaining relatively stable weight with diuresis).  HENT: Negative for congestion and nosebleeds.   Respiratory: Positive for shortness of breath (A little bit more than usual).   Cardiovascular: Positive for leg swelling (Notably improved).  Gastrointestinal: Negative for blood in stool and melena.  Genitourinary: Negative for hematuria.  Musculoskeletal: Positive for joint pain  and neck pain. Negative for falls.  Neurological: Negative for dizziness and headaches.  Psychiatric/Behavioral: Negative for memory loss. The patient is not nervous/anxious (Seems stable).    I have reviewed and (if needed) personally updated the patient's problem list, medications, allergies, past medical and surgical history, social and family history.   PAST MEDICAL HISTORY   Past Medical History:  Diagnosis Date  . Alcohol abuse 04/25/2015   reportedly drinks ~ 1 bottle of wine / day  . Allergy   . Anastomotic ulcer S/P gastric bypass 06/18/2016  . Arthritis   . Cataract   . Chronic diastolic heart failure (HCC) 10/25/2017  . Degenerative disc disease, cervical   . DVT, bilateral lower limbs (HCC) 05/2020   Age-indeterminate bilateral PERONEAL VEIN DVTs --> (was postop from acute cord compression, started on Xarelto Dosepak  . Essential hypertension 04/25/2015  . GAD (generalized anxiety disorder) 06/14/2016  . HNP (herniated nucleus pulposus) with myelopathy, cervical 05/27/2020   Cervical Myelopathy with Spinal cord compression (HCC)  --> s/p Anterior Cervical Discectomy Decompression)   . Hypothyroidism   . 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 UGIB (upper gastrointestinal bleed) 04/25/2015  . Venous stasis of both lower extremities 01/10/2019    6/28-7/3 /2021: Awoke a.m. of 6/28 with significant LLE weakness and numbness from knee down own -> progressive exacerbation-having EMS called because of an inability to walk--> brain MRI was performed as head CT did not show acute stroke.  MRI brain was nonrevealing, however cervical-lumbar MRI revealed ACUTE CORD COMPRESSION from C7-T1 disc fragment (herniated nucleus pulposis with Cervical myelopathy). --> NSGx c/s --> emergent Anterior Cervical Discectomy/Decompression --> had significant improvement in left lower extremity weakness. --> d/c to CIR.  No SSx of EtOH withdrawal -  covered with CIWA protocol   CHF was adequately compensated.   Noted to have low Albumin - protein malnutrition.   7/3-15/2021 - CIR -->   Dx - Age Intermediate Bilateral Peroneal V DVT - started on Xarelto dose pack (with plan to convert from 15 mg BID to 20 mg daily on 7/28)  Pain management with Neurontin & Flexeril with PRN Oxycodone & tramadol  Prednisone added for nerve root decompression.   BP noted to be Elevated --> Cored dose was increased to 12.5 mg BID (she never took this increased dose).   PAST SURGICAL HISTORY   Past Surgical History:  Procedure Laterality Date  . ANTERIOR CERVICAL DECOMP/DISCECTOMY FUSION Left 05/26/2020   Procedure: Cervical seven - Thoracic one ANTERIOR CERVICAL DISCECTOMY AND PLATING;  Surgeon: Tressie Stalker, MD;  Location[facility] OR;  Service: Neurosurgery;  Laterality: Left;  . CHOLECYSTECTOMY    . COSMETIC SURGERY    . DIAGNOSTIC LAPAROSCOPY    . ESOPHAGOGASTRODUODENOSCOPY N/A 04/25/2015   Procedure: ESOPHAGOGASTRODUODENOSCOPY (EGD);  Surgeon: Beverley Fiedler, MD;  Location[facility] ENDOSCOPY;  Service: Endoscopy;  Laterality: N/A;  . FRACTURE SURGERY    . GASTRIC BYPASS    . NM MYOVIEW LTD  10/10/2017   Normal LV size and function EF 72%.  Medium sized mild severity defect in the apical septal apical lateral  and apical wall suggestive of breast attenuation.  LOW RISK.  No ischemia or infarction.    . OPEN REDUCTION INTERNAL FIXATION (ORIF) DISTAL RADIAL FRACTURE Right 01/15/2013   Procedure: OPEN REDUCTION INTERNAL FIXATION (ORIF) Right DISTAL RADIUS FRACTURE;  Surgeon: Eldred Manges, MD;  Location: [facility] OR;  Service: Orthopedics;  Laterality: Right;  . TRANSTHORACIC ECHOCARDIOGRAM  10/04/2017   Mild LVH.  EF 60-65%.  Grade 2/moderate diastolic dysfunction.  Mild pulmonary hypertension.  No valve lesions   Immunization History  Administered Date(s) Administered  . Fluad Quad(high Dose 65+) 09/03/2019  . Influenza, High Dose Seasonal PF 09/25/2017  .  Influenza,inj,Quad PF,6+ Mos 01/10/2017  . PFIZER SARS-COV-2 Vaccination 01/10/2020  . Td 11/27/2013  . Tdap 12/05/2018    MEDICATIONS/ALLERGIES   Current Meds  Medication Sig  . acetaminophen (TYLENOL) 325 MG tablet Take 2 tablets (650 mg total) by mouth every 4 (four) hours as needed for mild pain ((score 1 to 3) or temp > 100.5).  Marland Kitchen benazepril-hydrochlorthiazide (LOTENSIN HCT) 20-25 MG tablet Take 1 tablet by mouth daily.  . carvedilol (COREG) 6.25 MG tablet Take 1 tablet (6.25 mg total) by mouth 2 (two) times daily with a meal.  . Cholecalciferol (VITAMIN D3) 125 MCG (5000 UT) TABS Take 3 tablets (15,000 Units total) by mouth See admin instructions. Takes 28366 units 5 days a week and 20000 units on 2 days.  . Cyanocobalamin (VITAMIN B 12) 100 MCG LOZG Take 2,500 mg by mouth once a week.  . cyclobenzaprine (FLEXERIL) 10 MG tablet Take 1 tablet (10 mg total) by mouth daily as needed for muscle spasms. .  . ferrous sulfate 325 (65 FE) MG tablet Take 1 tablet (325 mg total) by mouth daily with breakfast.  . furosemide (LASIX) 20 MG tablet Pt take 40 mg Tuesday and Saturday 20 mg all other days  . gabapentin (NEURONTIN) 300 MG capsule Take 300 mg by mouth See admin instructions. Take 5 capsules in the morning and 4 capsules at bedtime.   Marland Kitchen levothyroxine (SYNTHROID) 125 MCG tablet Only take 1 tab Tues and Fridays (Patient taking differently: Take 125 mcg by mouth See admin instructions. Take 1 tablet by mouth on Tuesday and Fridays)  . levothyroxine (SYNTHROID) 150 MCG tablet everyday except Tues and Friday- take those days (Patient taking differently: Take 150 mcg by mouth See admin instructions. Takes everyday except Tues and Friday)  . meloxicam (MOBIC) 15 MG tablet TAKE 1 TABLET BY MOUTH DAILY.  Marland Kitchen Oxycodone HCl 10 MG TABS Take 1 tablet (10 mg total) by mouth every 3 (three) hours as needed ((score 7 to 10)).  Marland Kitchen pantoprazole (PROTONIX) 40 MG tablet Take 1 tablet (40 mg total) by mouth  daily.  . rivaroxaban (XARELTO) 20 MG TABS tablet Take 1 tablet (20 mg total) by mouth daily.  . rosuvastatin (CRESTOR) 5 MG tablet Take 1 tablet (5 mg total) by mouth daily.  . traMADol (ULTRAM) 50 MG tablet TAKE 1 TABLET BY MOUTH 2 TIMES DAILY. MAXIMUM 6 TABLETS PER DAY AS DIRECTED BY PRESCRIBER    Allergies  Allergen Reactions  . Cocoa Anaphylaxis  . Red Dye Hives    Rash and Nausea diarrhea  . Lyrica [Pregabalin] Other (See Comments)    Abnormal bleeding  . Other Other (See Comments)    Berries : Rash  . Sulfa Antibiotics Rash    SOCIAL HISTORY/FAMILY HISTORY   Social History   Tobacco Use  . Smoking status: Never Smoker  . Smokeless tobacco:  Never Used  Vaping Use  . Vaping Use: Never used  Substance Use Topics  . Alcohol use: Yes    Alcohol/week: 28.0 standard drinks    Types: 28 Glasses of wine per week    Comment: drinks daily and has a bottle of wine per day  . 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  Husband assists with all the ADLs.  Family History family history includes Breast cancer in her sister; Breast cancer (age of onset: 34) in her mother; COPD in her father; Cancer in her father and mother; Cancer (age of onset: 39) in her cousin; Diabetes in her father; Heart disease in her mother; Hypertension in her father and mother; Mental illness in her maternal grandmother; Stroke in her maternal grandmother; Stroke (age of onset: 28) in her mother.   OBJCTIVE -PE, EKG, labs   Wt Readings from Last 3 Encounters:  07/28/20 226 lb 9.6 oz (102.8 kg)  06/25/20 (!) 229 lb 6.4 oz (104.1 kg)  06/21/20 (!) 228 lb (103.4 kg)    Physical Exam: BP (!) 142/88   Pulse 79   Ht 5' 5.5" (1.664 m)   Wt 226 lb 9.6 oz (102.8  kg)   SpO2 95%   BMI 37.13 kg/m   -Recheck of BP was 130/70 8 mmHg--at home usually runs 120-130/78 mmHg; at home wgt this AM was 222 LB Physical Exam Vitals reviewed.  Constitutional:      General: She is not in acute distress.    Appearance: Normal appearance. She is obese. She is not ill-appearing (Looks less chronically ill-appearing).     Comments: Essentially morbidly obese with BMI of 37 and risk factors  HENT:     Head: Normocephalic and atraumatic.  Neck:     Vascular: No hepatojugular reflux.     Comments: C-collar in place (unable to assess JVP or carotid bruit) Cardiovascular:     Rate and Rhythm: Normal rate and regular rhythm.  No extrasystoles are present.    Chest Wall: PMI is not displaced (Unable to palpate).     Pulses: Decreased pulses (Difficult palpate due to edema.).     Heart sounds: S1 normal and S2 normal. Heart sounds are distant. Murmur (Barely audible 1/6 SEM at RUSB.) heard.   Pulmonary:     Effort: Pulmonary effort is normal. No respiratory distress.     Breath sounds: No wheezing, rhonchi or rales (Minimal bibasilar).     Comments: Mild diffuse interstitial sounds/crackles Chest:     Chest wall: No tenderness.  Abdominal:     General: Bowel sounds are normal. There is no distension.     Palpations: Abdomen is soft. There is no mass.     Tenderness: There is no abdominal tenderness.     Comments: Obese.  Unable to assess HSM  Musculoskeletal:        General: Swelling (Tense bilateral pitting edema roughly 1-2++ up to knees.  Mostly noted in the feet.  Purple violaceous venous stasis changes.  Wearing support stockings with toes cut out.) present. Normal range of motion.     Cervical back: Rigidity (C Collar) present.     Right  lower leg: No edema.     Left lower leg: No edema.  Neurological:     General: No focal deficit present.     Mental Status: She is alert and oriented to person, place, and time.     Comments: Not tested because of c-collar   Psychiatric:        Mood and Affect: Mood normal.        Behavior: Behavior normal.        Thought Content: Thought content normal.        Judgment: Judgment normal.     Adult ECG Report N/A  Recent Labs:   Lab Results  Component Value Date   CHOL 131 02/16/2020   HDL 67 02/16/2020   LDLCALC 36 02/16/2020   LDLDIRECT 39 11/03/2019   TRIG 174 (H) 02/16/2020   CHOLHDL 2.0 02/16/2020   Lab Results  Component Value Date   CREATININE 1.19 (H) 07/06/2020   BUN 22 07/06/2020   NA 138 07/06/2020   K 4.7 07/06/2020   CL 99 07/06/2020   CO2 29 07/06/2020   Lab Results  Component Value Date   TSH 1.380 02/16/2020   Lab Results  Component Value Date   HGBA1C 5.3 05/01/2019    ASSESSMENT/PLAN    Problem List Items Addressed This Visit    Chronic heart failure with preserved ejection fraction (HFpEF) (HCC) (Chronic)    Continue to lose weight.  She says she was always down to 222 pounds this morning which is a notable improvement.  She diuresed very well with the short burst increase of furosemide.  Plan:  She will continued that she method of sliding scale Lasix -> increasing dose to 40 mg as needed for weight gain greater than 3 pounds.  For now continue current dose of carvedilol, probably have room to increase to 12.5mg  if she continues to monitor her pressures.  Continue benazepril-HCTZ, with combination of HCTZ and Lasix, need to closely monitor renal function and electrolytes.  Follow-up blood pressure log to determine stability of blood pressures.  Continue to wear support stockings after wound on the left leg is healed and keep it elevated as much as possible        Essential hypertension (Chronic)    Pressures at home are definitely better than they are here.  I have asked that she continue to monitor her blood pressure and keep a log.  Would like to see if we can potentially titrate up carvedilol dose to avoid further palpitations tachycardia.      HLD  (hyperlipidemia) (Chronic)    Most recent labs were in March --> well within target zone on current dose of rosuvastatin at 5 mg daily.  High triglycerides, discussed dietary changes.      Peripheral neuropathy due to edema b/l feet (Chronic)    On multiple different medications.  Managed by PCP and others.      Venous stasis of both lower extremities (Chronic)    She definitely does have some venous stasis a lot of her swelling is not necessarily heart failure related.  Continue current diuretic dosing and continue to stress support stocking along with elevating her legs.        COVID-19 Education: The signs and symptoms of COVID-19 were discussed with the patient and how to seek care for testing (follow up with PCP or arrange E-visit).   The importance of social distancing and COVID-19 vaccination was discussed today.  I spent a total of 26 minutes with  the patien in direct patient consultation. -->  She went through a detailed explanation of her hospitalization and complex and desires Additional time spent with chart review  / charting (studies, outside notes, etc): 18 --> Previous clinic visits and prolonged hospitalization reviewed.  Understanding medication adjustments.  Total Time: 44 min  Current medicines are reviewed at length with the patient today.  (+/- concerns) N/A Notice: This dictation was prepared with Dragon dictation along with smaller phrase technology. Any transcriptional errors that result from this process are unintentional and may not be corrected upon review.  Patient Instructions / Medication Changes & Studies & Tests Ordered   Patient Instructions  Medication Instructions:  Continue current medications  *If you need a refill on your cardiac medications before your next appointment, please call your pharmacy*   Lab Work: None Ordered   Testing/Procedures: None Ordered   Follow-Up: At [facility], you and your health needs are our  priority.  As part of our continuing mission to provide you with exceptional heart care, we have created designated Provider Care Teams.  These Care Teams include your primary Cardiologist (physician) and Advanced Practice Providers (APPs -  Physician Assistants and Nurse Practitioners) who all work together to provide you with the care you need, when you need it.  We recommend signing up for the patient portal called "MyChart".  Sign up information is provided on this After Visit Summary.  MyChart is used to connect with patients for Virtual Visits (Telemedicine).  Patients are able to view lab/test results, encounter notes, upcoming appointments, etc.  Non-urgent messages can be sent to your provider as well.   To learn more about what you can do with MyChart, go to [email or web address].    Your next appointment:   6 month(s)  The format for your next appointment:   In Person  Provider:   You may see Bryan Lemma, MD or one of the following Advanced Practice Providers on your designated Care Team:    Theodore Demark, PA-C  Joni Reining, DNP, ANP  Cadence Fransico Michael, PA-C    Other Instructions Keep feet Elevated as much a possible Wear supporting stocking after healing Keep blood pressure log    Studies Ordered:   No orders of the defined types were placed in this encounter.    Bryan Lemma, M.D., M.S. Interventional Cardiologist   Pager # [phone] Phone # [phone] [address] [place], [place] [address]   Thank you for choosing [facility] at [facility]!!

## 2020-07-29 DIAGNOSIS — M5023 Other cervical disc displacement, cervicothoracic region: Secondary | ICD-10-CM | POA: Diagnosis not present

## 2020-07-29 DIAGNOSIS — I5030 Unspecified diastolic (congestive) heart failure: Secondary | ICD-10-CM | POA: Diagnosis not present

## 2020-07-29 DIAGNOSIS — M48061 Spinal stenosis, lumbar region without neurogenic claudication: Secondary | ICD-10-CM | POA: Diagnosis not present

## 2020-07-29 DIAGNOSIS — M47816 Spondylosis without myelopathy or radiculopathy, lumbar region: Secondary | ICD-10-CM | POA: Diagnosis not present

## 2020-07-29 DIAGNOSIS — M47812 Spondylosis without myelopathy or radiculopathy, cervical region: Secondary | ICD-10-CM | POA: Diagnosis not present

## 2020-07-29 DIAGNOSIS — E039 Hypothyroidism, unspecified: Secondary | ICD-10-CM | POA: Diagnosis not present

## 2020-07-29 DIAGNOSIS — Z4789 Encounter for other orthopedic aftercare: Secondary | ICD-10-CM | POA: Diagnosis not present

## 2020-07-29 DIAGNOSIS — I110 Hypertensive heart disease with heart failure: Secondary | ICD-10-CM | POA: Diagnosis not present

## 2020-07-29 DIAGNOSIS — D509 Iron deficiency anemia, unspecified: Secondary | ICD-10-CM | POA: Diagnosis not present

## 2020-07-30 ENCOUNTER — Telehealth: Payer: Self-pay | Admitting: *Deleted

## 2020-07-30 DIAGNOSIS — I110 Hypertensive heart disease with heart failure: Secondary | ICD-10-CM | POA: Diagnosis not present

## 2020-07-30 DIAGNOSIS — I5030 Unspecified diastolic (congestive) heart failure: Secondary | ICD-10-CM | POA: Diagnosis not present

## 2020-07-30 DIAGNOSIS — M48061 Spinal stenosis, lumbar region without neurogenic claudication: Secondary | ICD-10-CM | POA: Diagnosis not present

## 2020-07-30 DIAGNOSIS — E039 Hypothyroidism, unspecified: Secondary | ICD-10-CM | POA: Diagnosis not present

## 2020-07-30 DIAGNOSIS — M5023 Other cervical disc displacement, cervicothoracic region: Secondary | ICD-10-CM | POA: Diagnosis not present

## 2020-07-30 DIAGNOSIS — M47812 Spondylosis without myelopathy or radiculopathy, cervical region: Secondary | ICD-10-CM | POA: Diagnosis not present

## 2020-07-30 DIAGNOSIS — Z4789 Encounter for other orthopedic aftercare: Secondary | ICD-10-CM | POA: Diagnosis not present

## 2020-07-30 DIAGNOSIS — M47816 Spondylosis without myelopathy or radiculopathy, lumbar region: Secondary | ICD-10-CM | POA: Diagnosis not present

## 2020-07-30 DIAGNOSIS — D509 Iron deficiency anemia, unspecified: Secondary | ICD-10-CM | POA: Diagnosis not present

## 2020-07-30 NOTE — Telephone Encounter (Signed)
Already in computer as of 07/23/20- thanks

## 2020-07-30 NOTE — Telephone Encounter (Signed)
Notified. 

## 2020-07-30 NOTE — Telephone Encounter (Signed)
[name] from [place] called to request an ENT referral for a vocal assessment due to vocal strain.

## 2020-08-02 DIAGNOSIS — M47816 Spondylosis without myelopathy or radiculopathy, lumbar region: Secondary | ICD-10-CM | POA: Diagnosis not present

## 2020-08-02 DIAGNOSIS — I5030 Unspecified diastolic (congestive) heart failure: Secondary | ICD-10-CM | POA: Diagnosis not present

## 2020-08-02 DIAGNOSIS — I110 Hypertensive heart disease with heart failure: Secondary | ICD-10-CM | POA: Diagnosis not present

## 2020-08-02 DIAGNOSIS — M47812 Spondylosis without myelopathy or radiculopathy, cervical region: Secondary | ICD-10-CM | POA: Diagnosis not present

## 2020-08-02 DIAGNOSIS — D509 Iron deficiency anemia, unspecified: Secondary | ICD-10-CM | POA: Diagnosis not present

## 2020-08-02 DIAGNOSIS — Z4789 Encounter for other orthopedic aftercare: Secondary | ICD-10-CM | POA: Diagnosis not present

## 2020-08-02 DIAGNOSIS — M5023 Other cervical disc displacement, cervicothoracic region: Secondary | ICD-10-CM | POA: Diagnosis not present

## 2020-08-02 DIAGNOSIS — M48061 Spinal stenosis, lumbar region without neurogenic claudication: Secondary | ICD-10-CM | POA: Diagnosis not present

## 2020-08-02 DIAGNOSIS — E039 Hypothyroidism, unspecified: Secondary | ICD-10-CM | POA: Diagnosis not present

## 2020-08-03 ENCOUNTER — Other Ambulatory Visit: Payer: Self-pay

## 2020-08-03 ENCOUNTER — Ambulatory Visit (INDEPENDENT_AMBULATORY_CARE_PROVIDER_SITE_OTHER): Payer: Medicare PPO

## 2020-08-03 ENCOUNTER — Ambulatory Visit (INDEPENDENT_AMBULATORY_CARE_PROVIDER_SITE_OTHER): Payer: Medicare PPO | Admitting: Sports Medicine

## 2020-08-03 DIAGNOSIS — M170 Bilateral primary osteoarthritis of knee: Secondary | ICD-10-CM

## 2020-08-03 MED ORDER — ALPHA-LIPOIC ACID 600 MG PO CAPS: 1.0000 | ORAL_CAPSULE | Freq: Every day | ORAL | 11 refills | Status: DC

## 2020-08-03 NOTE — Progress Notes (Signed)
    Procedures performed today:    Procedure: Real-time Ultrasound Guided  aspiration/injection of the right knee Device: Samsung HS60  Verbal informed consent obtained.  Time-out conducted.  Noted no overlying erythema, induration, or other signs of local infection.  Skin prepped in a sterile fashion.  Local anesthesia: Topical Ethyl chloride.  With sterile technique and under real time ultrasound guidance:  Using 18-gauge needle aspirated 21 cc of clear, straw-colored fluid, syringe switched and 1 cc Kenalog 40, 2 cc lidocaine, 2 cc bupivacaine injected easily Completed without difficulty  Pain immediately resolved suggesting accurate placement of the medication.  Advised to call if fevers/chills, erythema, induration, drainage, or persistent bleeding.  Images permanently stored and available for review in PACS.  Impression: Technically successful ultrasound guided injection.  ___________________________________________ Ihor Austin. Benjamin Stain, M.D., ABFM., CAQSM. Primary Care and Sports Medicine [facility]   Adjunct Instructor of Family Medicine  [employer]  Independent interpretation of notes and tests performed by another provider:   None.  Brief History, Exam, Impression, and Recommendations:    Primary osteoarthritis of both knees Right knee aspiration/injection today, last done in November 2020. Of note she is a candidate for total knee arthroplasty. Orthovisc was too expensive recently. There is some significant neuropathic pains, certainly an SNRI would be helpful, adding some alpha lipoic acid as well.    ___________________________________________ Ihor Austin. Benjamin Stain, M.D., ABFM., CAQSM. Primary Care and Sports Medicine [facility]  Adjunct Instructor of Family Medicine  [employer]

## 2020-08-03 NOTE — Assessment & Plan Note (Addendum)
Right knee aspiration/injection today, last done in November 2020. Of note she is a candidate for total knee arthroplasty. Orthovisc was too expensive recently. There is some significant neuropathic pains, certainly an SNRI would be helpful, adding some alpha lipoic acid as well.

## 2020-08-04 ENCOUNTER — Encounter: Payer: Self-pay | Admitting: Cardiology

## 2020-08-04 DIAGNOSIS — I5030 Unspecified diastolic (congestive) heart failure: Secondary | ICD-10-CM | POA: Diagnosis not present

## 2020-08-04 DIAGNOSIS — M47812 Spondylosis without myelopathy or radiculopathy, cervical region: Secondary | ICD-10-CM | POA: Diagnosis not present

## 2020-08-04 DIAGNOSIS — M5023 Other cervical disc displacement, cervicothoracic region: Secondary | ICD-10-CM | POA: Diagnosis not present

## 2020-08-04 DIAGNOSIS — D509 Iron deficiency anemia, unspecified: Secondary | ICD-10-CM | POA: Diagnosis not present

## 2020-08-04 DIAGNOSIS — E039 Hypothyroidism, unspecified: Secondary | ICD-10-CM | POA: Diagnosis not present

## 2020-08-04 DIAGNOSIS — Z4789 Encounter for other orthopedic aftercare: Secondary | ICD-10-CM | POA: Diagnosis not present

## 2020-08-04 DIAGNOSIS — M48061 Spinal stenosis, lumbar region without neurogenic claudication: Secondary | ICD-10-CM | POA: Diagnosis not present

## 2020-08-04 DIAGNOSIS — M47816 Spondylosis without myelopathy or radiculopathy, lumbar region: Secondary | ICD-10-CM | POA: Diagnosis not present

## 2020-08-04 DIAGNOSIS — I110 Hypertensive heart disease with heart failure: Secondary | ICD-10-CM | POA: Diagnosis not present

## 2020-08-04 NOTE — Assessment & Plan Note (Signed)
She definitely does have some venous stasis a lot of her swelling is not necessarily heart failure related.  Continue current diuretic dosing and continue to stress support stocking along with elevating her legs.

## 2020-08-04 NOTE — Assessment & Plan Note (Signed)
Continue to lose weight.  She says she was always down to 222 pounds this morning which is a notable improvement.  She diuresed very well with the short burst increase of furosemide.  Plan:  She will continued that she method of sliding scale Lasix -> increasing dose to 40 mg as needed for weight gain greater than 3 pounds.  For now continue current dose of carvedilol, probably have room to increase to 12.5mg  if she continues to monitor her pressures.  Continue benazepril-HCTZ, with combination of HCTZ and Lasix, need to closely monitor renal function and electrolytes.  Follow-up blood pressure log to determine stability of blood pressures.  Continue to wear support stockings after wound on the left leg is healed and keep it elevated as much as possible

## 2020-08-04 NOTE — Assessment & Plan Note (Signed)
Pressures at home are definitely better than they are here.  I have asked that she continue to monitor her blood pressure and keep a log.  Would like to see if we can potentially titrate up carvedilol dose to avoid further palpitations tachycardia.

## 2020-08-04 NOTE — Assessment & Plan Note (Signed)
>>  ASSESSMENT AND PLAN FOR VENOUS STASIS OF BOTH LOWER EXTREMITIES WRITTEN ON 08/04/2020  5:12 PM BY HARDING, Piedad Climes, MD  She definitely does have some venous stasis a lot of her swelling is not necessarily heart failure related.  Continue current diuretic dosing and continue to stress support stocking along with elevating her legs.

## 2020-08-04 NOTE — Assessment & Plan Note (Signed)
Most recent labs were in March --> well within target zone on current dose of rosuvastatin at 5 mg daily.  High triglycerides, discussed dietary changes.

## 2020-08-04 NOTE — Assessment & Plan Note (Signed)
On multiple different medications.  Managed by PCP and others.

## 2020-08-16 ENCOUNTER — Encounter: Payer: Self-pay | Admitting: Physical Medicine and Rehabilitation

## 2020-08-16 ENCOUNTER — Other Ambulatory Visit: Payer: Self-pay

## 2020-08-16 ENCOUNTER — Encounter: Payer: Medicare PPO | Attending: Registered Nurse | Admitting: Physical Medicine and Rehabilitation

## 2020-08-16 VITALS — BP 104/62 | HR 78 | Ht 65.5 in | Wt 219.7 lb

## 2020-08-16 DIAGNOSIS — G629 Polyneuropathy, unspecified: Secondary | ICD-10-CM

## 2020-08-16 DIAGNOSIS — M5000 Cervical disc disorder with myelopathy, unspecified cervical region: Secondary | ICD-10-CM | POA: Insufficient documentation

## 2020-08-16 DIAGNOSIS — G9520 Unspecified cord compression: Secondary | ICD-10-CM | POA: Diagnosis not present

## 2020-08-16 DIAGNOSIS — E039 Hypothyroidism, unspecified: Secondary | ICD-10-CM | POA: Insufficient documentation

## 2020-08-16 DIAGNOSIS — E7849 Other hyperlipidemia: Secondary | ICD-10-CM | POA: Insufficient documentation

## 2020-08-16 DIAGNOSIS — R531 Weakness: Secondary | ICD-10-CM | POA: Diagnosis not present

## 2020-08-16 DIAGNOSIS — M792 Neuralgia and neuritis, unspecified: Secondary | ICD-10-CM

## 2020-08-16 DIAGNOSIS — I10 Essential (primary) hypertension: Secondary | ICD-10-CM | POA: Insufficient documentation

## 2020-08-16 DIAGNOSIS — G8918 Other acute postprocedural pain: Secondary | ICD-10-CM | POA: Insufficient documentation

## 2020-08-16 NOTE — Progress Notes (Signed)
Subjective:    Patient ID: [name], female    DOB: December 15, 1945, 74 y.o.   MRN: [id number]  HPI  Due to national recommendations of social distancing because of COVID 61, an audio/video tele-health visit is felt to be the most appropriate encounter for this patient at this time. See MyChart message from today for the patient's consent to a tele-health encounter with [facility]. This is a follow up tele-visit via Webex. The patient is at home. MD is at office.    Weaknesssecondary to large herniated disc at C7-T1/cord compression. Status post anterior cervical discectomy with decompression 05/27/2020- also B/L peroneal DVTs, hx of roux en- Y-   Getting stronger- still using a RW- had 1 fall- lost balance- no injury at that time   Can cook, do laundry and manage kitchen- which are the chores she did prior to surgery.   BP going up and down- Cards trying to manage that.   A lot of numbness in feet- B/L.   Day to day living.  Pain- a whole lot better- they added Alpha lineoic acid and has helped nerve pain.  Swelling under control and watches to not have feet dependent too long.  Pain level is the same as before surgery- mainly arthritis pain.  Drew more fluid off her knee 3 weeks ago and helped knee pain.  Has her on Tramadol- 50 mg 1-2x/day- with Mobic and tylenol- and usually tylenol at night.   Has appointment with PCP next week about Xarelto (no swelling in legs).   Had H/H OT and PT- and does HEP- 2x/day (forget to do 3x/day).   Doesn't feel like needs to go to Outpatient.   Swallowing- resolved well- and followed advice of SLP and not choking anymore or coughing with eating- eating normal diet.       Pain Inventory Average Pain 0 Pain Right Now 0 My pain is no pain  In the last 24 hours, has pain interfered with the following? General activity no pain Relation with others no pain Enjoyment of life no pain What TIME of  day is your pain at its worst? No pain  Sleep (in general) Fair  Pain is worse with: no pain Pain improves with: no pain Relief from Meds: no pain  Family History  Problem Relation Age of Onset  . Heart disease Mother   . Stroke Mother 62  . Cancer Mother        skin and breast  . Hypertension Mother   . Breast cancer Mother 46  . COPD Father   . Cancer Father   . Diabetes Father   . Hypertension Father   . Stroke Maternal Grandmother   . Mental illness Maternal Grandmother   . Breast cancer Sister   . Cancer Cousin 89   Social History   Socioeconomic History  . Marital status: Married    Spouse name: Not on file  . Number of children: Not on file  . Years of education: Not on file  . Highest education level: Not on file  Occupational History  . Occupation: Charity fundraiser    Comment: ICU Sales executive  Tobacco Use  . Smoking status: Never Smoker  . Smokeless tobacco: Never Used  Vaping Use  . Vaping Use: Never used  Substance and Sexual Activity  . Alcohol use: Yes    Alcohol/week: 28.0 standard drinks    Types: 28 Glasses of wine per week    Comment: drinks  daily and has a bottle of wine per day  . Drug use: No  . Sexual activity: Yes    Birth control/protection: Post-menopausal  Other Topics Concern  . 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 Difficulty of Paying Living Expenses: Not on file  Food Insecurity:   . Worried About Programme researcher, broadcasting/film/videounning Out of Food in the Last Year: Not on file  . Ran Out of Food in the Last Year: Not on file  Transportation Needs:   . Lack of Transportation (Medical): Not on file  . Lack of Transportation (Non-Medical): Not on file  Physical Activity:   . Days of Exercise per Week: Not on file  . Minutes of Exercise per  Session: Not on file  Stress:   . Feeling of Stress : Not on file  Social Connections:   . Frequency of Communication with Friends and Family: Not on file  . Frequency of Social Gatherings with Friends and Family: Not on file  . Attends Religious Services: Not on file  . Active Member of Clubs or Organizations: Not on file  . Attends BankerClub or Organization Meetings: Not on file  . Marital Status: Not on file   Past Surgical History:  Procedure Laterality Date  . ANTERIOR CERVICAL DECOMP/DISCECTOMY FUSION Left 05/26/2020   Procedure: Cervical seven - Thoracic one ANTERIOR CERVICAL DISCECTOMY AND PLATING;  Surgeon: Tressie StalkerJenkins, Jeffrey, MD;  [facility];  Service: Neurosurgery;  Laterality: Left;  . CHOLECYSTECTOMY    . COSMETIC SURGERY    . DIAGNOSTIC LAPAROSCOPY    . ESOPHAGOGASTRODUODENOSCOPY N/A 04/25/2015   Procedure: ESOPHAGOGASTRODUODENOSCOPY (EGD);  Surgeon: Beverley FiedlerJay M Pyrtle, MD;  [facility];  Service: Endoscopy;  Laterality: N/A;  . FRACTURE SURGERY    . GASTRIC BYPASS    . NM MYOVIEW LTD  10/10/2017   Normal LV size and function EF 72%.  Medium sized mild severity defect in the apical septal apical lateral and apical wall suggestive of breast attenuation.  LOW RISK.  No ischemia or infarction.    . OPEN REDUCTION INTERNAL FIXATION (ORIF) DISTAL RADIAL FRACTURE Right 01/15/2013   Procedure: OPEN REDUCTION INTERNAL FIXATION (ORIF) Right DISTAL RADIUS FRACTURE;  Surgeon: Eldred MangesMark C Yates, MD;  Location: [facility] OR;  Service: Orthopedics;  Laterality: Right;  . TRANSTHORACIC ECHOCARDIOGRAM  10/04/2017   Mild LVH.  EF 60-65%.  Grade 2/moderate diastolic dysfunction.  Mild pulmonary hypertension.  No valve lesions   Past Surgical History:  Procedure Laterality Date  . ANTERIOR CERVICAL DECOMP/DISCECTOMY FUSION Left 05/26/2020   Procedure: Cervical seven - Thoracic one ANTERIOR CERVICAL DISCECTOMY AND PLATING;  Surgeon: Tressie StalkerJenkins, Jeffrey, MD;  [facility];  Service: Neurosurgery;   Laterality: Left;  . CHOLECYSTECTOMY    . COSMETIC SURGERY    . DIAGNOSTIC LAPAROSCOPY    . ESOPHAGOGASTRODUODENOSCOPY N/A 04/25/2015   Procedure: ESOPHAGOGASTRODUODENOSCOPY (EGD);  Surgeon: Beverley FiedlerJay M Pyrtle, MD;  [facility];  Service: Endoscopy;  Laterality: N/A;  . FRACTURE SURGERY    . GASTRIC BYPASS    . NM MYOVIEW LTD  10/10/2017   Normal LV size and function EF 72%.  Medium sized mild severity defect in the apical septal apical lateral and apical wall  suggestive of breast attenuation.  LOW RISK.  No ischemia or infarction.    . OPEN REDUCTION INTERNAL FIXATION (ORIF) DISTAL RADIAL FRACTURE Right 01/15/2013   Procedure: OPEN REDUCTION INTERNAL FIXATION (ORIF) Right DISTAL RADIUS FRACTURE;  Surgeon: Eldred Manges, MD;  Location: [facility] OR;  Service: Orthopedics;  Laterality: Right;  . TRANSTHORACIC ECHOCARDIOGRAM  10/04/2017   Mild LVH.  EF 60-65%.  Grade 2/moderate diastolic dysfunction.  Mild pulmonary hypertension.  No valve lesions   Past Medical History:  Diagnosis Date  . Alcohol abuse 04/25/2015   reportedly drinks ~ 1 bottle of wine / day  . Allergy   . Anastomotic ulcer S/P gastric bypass 06/18/2016  . Arthritis   . Cataract   . Chronic diastolic heart failure (HCC) 10/25/2017  . Degenerative disc disease, cervical   . DVT, bilateral lower limbs (HCC) 05/2020   Age-indeterminate bilateral PERONEAL VEIN DVTs --> (was postop from acute cord compression, started on Xarelto Dosepak  . Essential hypertension 04/25/2015  . GAD (generalized anxiety disorder) 06/14/2016  . HNP (herniated nucleus pulposus) with myelopathy, cervical 05/27/2020   Cervical Myelopathy with Spinal cord compression (HCC)  --> s/p Anterior Cervical Discectomy Decompression)   . Hypothyroidism   . 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 UGIB (upper gastrointestinal bleed) 04/25/2015  . Venous stasis of both lower extremities 01/10/2019   BP 104/62   Pulse  78   Ht 5' 5.5" (1.664 m)   Wt 219 lb 11.2 oz (99.7 kg)   BMI 36.00 kg/m   Opioid Risk Score:   Fall Risk Score:  `1  Depression screen PHQ 2/9  Depression screen [facility] 2/9 06/21/2020 04/13/2020 11/06/2019 05/08/2019 01/13/2019 11/04/2018 09/09/2018  Decreased Interest 0 0 0 0 0 0 0  Down, Depressed, Hopeless 0 0 0 0 1 0 1  PHQ - 2 Score 0 0 0 0 1 0 1  Altered sleeping 3 3 3 1 2 3 3   Tired, decreased energy 1 1 0 0 1 1 3   Change in appetite 0 0 0 0 0 1 1  Feeling bad or failure about yourself  0 0 0 0 0 0 0  Trouble concentrating 1 0 0 0 0 0 1  Moving slowly or fidgety/restless 0 0 0 0 0 0 2  Suicidal thoughts 0 0 0 0 0 0 0  PHQ-9 Score 5 4 3 1 4 5 11   Difficult doing work/chores - Not difficult at all Not difficult at all Not difficult at all Not difficult at all Not difficult at all Somewhat difficult  Some recent data might be hidden    Review of Systems  Constitutional: Negative.   HENT: Negative.   Eyes: Negative.   Respiratory: Negative.   Cardiovascular: Negative.   Gastrointestinal: Negative.   Endocrine: Negative.   Genitourinary: Negative.   Musculoskeletal: Positive for gait problem.  Skin: Negative.   Allergic/Immunologic: Negative.   Neurological: Negative for numbness.       Tingling   Hematological: Negative.   Psychiatric/Behavioral: Negative.   All other systems reviewed and are negative.      Objective:   Physical Exam   webex     Assessment & Plan:   to large herniated disc at C7-T1/cord compression. Status post anterior cervical discectomy with decompression 05/27/2020- also B/L peroneal DVTs, hx of roux en- Y-   1. Goal to get to cane- is still on RW.  Could do small grocery  store at this point- needs to  increase endurance.   2. No driving until foot numbness is improved and endurance improved.  We discussed it.   3. To speak with her PCP about Xarelto- usually continues for a total of 3 months.   4. F/U 2-3 months as  needed to see how she's progressing.    I spent a total of 20 minutes in visit- as detailed above.

## 2020-08-16 NOTE — Patient Instructions (Signed)
Weaknesssecondary to large herniated disc at C7-T1/cord compression. Status post anterior cervical discectomy with decompression 05/27/2020- also B/L peroneal DVTs, hx of roux en- Y-   1. Goal to get to cane- is still on RW.  Could do small grocery store at this point- needs to  increase endurance.   2. No driving until foot numbness is improved and endurance improved.  We discussed it.   3. To speak with her PCP about Xarelto- usually continues for a total of 3 months.   4. F/U 2-3 months as needed to see how she's progressing.

## 2020-08-23 ENCOUNTER — Other Ambulatory Visit: Payer: Self-pay

## 2020-08-23 ENCOUNTER — Ambulatory Visit: Payer: Medicare PPO | Admitting: Physician Assistant

## 2020-08-23 ENCOUNTER — Encounter: Payer: Self-pay | Admitting: Physician Assistant

## 2020-08-23 VITALS — BP 109/66 | HR 75 | Ht 65.5 in | Wt 224.2 lb

## 2020-08-23 DIAGNOSIS — E782 Mixed hyperlipidemia: Secondary | ICD-10-CM | POA: Diagnosis not present

## 2020-08-23 DIAGNOSIS — I82453 Acute embolism and thrombosis of peroneal vein, bilateral: Secondary | ICD-10-CM

## 2020-08-23 DIAGNOSIS — R600 Localized edema: Secondary | ICD-10-CM | POA: Diagnosis not present

## 2020-08-23 DIAGNOSIS — G9520 Unspecified cord compression: Secondary | ICD-10-CM | POA: Diagnosis not present

## 2020-08-23 DIAGNOSIS — E039 Hypothyroidism, unspecified: Secondary | ICD-10-CM

## 2020-08-23 DIAGNOSIS — I878 Other specified disorders of veins: Secondary | ICD-10-CM | POA: Diagnosis not present

## 2020-08-23 DIAGNOSIS — M5000 Cervical disc disorder with myelopathy, unspecified cervical region: Secondary | ICD-10-CM

## 2020-08-23 DIAGNOSIS — I10 Essential (primary) hypertension: Secondary | ICD-10-CM | POA: Diagnosis not present

## 2020-08-23 MED ORDER — RIVAROXABAN 20 MG PO TABS: 20.0000 mg | ORAL_TABLET | Freq: Every day | ORAL | 0 refills | Status: DC

## 2020-08-23 NOTE — Progress Notes (Signed)
Established Patient Office Visit  Subjective:  Patient ID: [name], female    DOB: 10-Aug-1946  Age: 74 y.o. MRN: [id number]  CC:  Chief Complaint  Patient presents with  . Hypertension  . Thyroid Problem    HPI [facility] presents for chronic follow up on hypertension and hypothyroid. Pt is accompanied by her husband. Patient is gradually recovering from C7-T1 anterior cervical discectomy decompression interbody arthrodesis with insertion of interbody prosthesis (05/2020). Pt was admitted for a large herniated nucleus pulposus with myelopathy at C7-T1. Patient completed inpatient rehabilitation and recently finished her home therapy sessions (PT/OT). She continues to do HEP. She is followed by Dr. Berline Chough.  HTN: Followed by Cardiology. Pt denies new onset chest pain, palpitations, dizziness or lower extremity swelling. She has chronic lower extremity edema which is stable. She has noticed some increased swelling but could possibly be related to increased sodium and not elevating her legs as frequently as she usually does. She does take Lasix to help with edema. Reports dizziness with rapid movements so tries to be more careful. Taking medication as directed without side effects. Checks BP at home and readings range in 130/76-78.   Hypothyroid: Asymptomatic. Taking medication as directed without issues.  Bilateral peroneal DVT: During hospitalization was started on Xarelto for peroneal and popliteal DVT. Patient has been on anticoagulant for 2 months.   Past Medical History:  Diagnosis Date  . Alcohol abuse 04/25/2015   reportedly drinks ~ 1 bottle of wine / day  . Allergy   . Anastomotic ulcer S/P gastric bypass 06/18/2016  . Arthritis   . Cataract   . Chronic diastolic heart failure (HCC) 10/25/2017  . Degenerative disc disease, cervical   . DVT, bilateral lower limbs (HCC) 05/2020   Age-indeterminate bilateral PERONEAL VEIN DVTs --> (was postop from acute  cord compression, started on Xarelto Dosepak  . Essential hypertension 04/25/2015  . GAD (generalized anxiety disorder) 06/14/2016  . HNP (herniated nucleus pulposus) with myelopathy, cervical 05/27/2020   Cervical Myelopathy with Spinal cord compression (HCC)  --> s/p Anterior Cervical Discectomy Decompression)   . Hypothyroidism   . 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 UGIB (upper gastrointestinal bleed) 04/25/2015  . Venous stasis of both lower extremities 01/10/2019    Past Surgical History:  Procedure Laterality Date  . ANTERIOR CERVICAL DECOMP/DISCECTOMY FUSION Left 05/26/2020   Procedure: Cervical seven - Thoracic one ANTERIOR CERVICAL DISCECTOMY AND PLATING;  Surgeon: Tressie Stalker, MD;  Location: [facility] OR;  Service: Neurosurgery;  Laterality: Left;  . CHOLECYSTECTOMY    . COSMETIC SURGERY    . DIAGNOSTIC LAPAROSCOPY    . ESOPHAGOGASTRODUODENOSCOPY N/A 04/25/2015   Procedure: ESOPHAGOGASTRODUODENOSCOPY (EGD);  Surgeon: Beverley Fiedler, MD;  Location: [facility] ENDOSCOPY;  Service: Endoscopy;  Laterality: N/A;  . FRACTURE SURGERY    . GASTRIC BYPASS    . NM MYOVIEW LTD  10/10/2017   Normal LV size and function EF 72%.  Medium sized mild severity defect in the apical septal apical lateral and apical wall suggestive of breast attenuation.  LOW RISK.  No ischemia or infarction.    . OPEN REDUCTION INTERNAL FIXATION (ORIF) DISTAL RADIAL FRACTURE Right 01/15/2013   Procedure: OPEN REDUCTION INTERNAL FIXATION (ORIF) Right DISTAL RADIUS FRACTURE;  Surgeon: Eldred Manges, MD;  Location: [facility] OR;  Service: Orthopedics;  Laterality: Right;  . TRANSTHORACIC ECHOCARDIOGRAM  10/04/2017   Mild LVH.  EF 60-65%.  Grade 2/moderate diastolic  dysfunction.  Mild pulmonary hypertension.  No valve lesions    Family History  Problem Relation Age of Onset  . Heart disease Mother   . Stroke Mother [id number]  . Cancer Mother        skin and breast  . Hypertension Mother   .  Breast cancer Mother 4280  . COPD Father   . Cancer Father   . Diabetes Father   . Hypertension Father   . Stroke Maternal Grandmother   . Mental illness Maternal Grandmother   . Breast cancer Sister   . Cancer Cousin [id number]    Social History   Socioeconomic History  . Marital status: Married    Spouse name: Not on file  . Number of children: Not on file  . Years of education: Not on file  . Highest education level: Not on file  Occupational History  . Occupation: Charity fundraiserN    Comment: ICU Sales executive[employer]  Tobacco Use  . Smoking status: Never Smoker  . Smokeless tobacco: Never Used  Vaping Use  . Vaping Use: Never used  Substance and Sexual Activity  . Alcohol use: Yes    Alcohol/week: 28.0 standard drinks    Types: 28 Glasses of wine per week    Comment: drinks daily and has a bottle of wine per day  . Drug use: No  . Sexual activity: Yes    Birth control/protection: Post-menopausal  Other Topics Concern  . 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 Difficulty of Paying Living Expenses: Not on file  Food Insecurity:   . Worried About Programme researcher, broadcasting/film/videounning Out of Food in the Last Year: Not on file  . Ran Out of Food in the Last Year: Not on file  Transportation Needs:   . Lack of Transportation (Medical): Not on file  . Lack of Transportation (Non-Medical): Not on file  Physical Activity:   . Days of Exercise per Week: Not on file  . Minutes of Exercise per Session: Not on file  Stress:   . Feeling of Stress : Not on file  Social Connections:   . Frequency of Communication with Friends and Family: Not on file  . Frequency of Social Gatherings with Friends and Family: Not on file  . Attends Religious Services: Not on file  . Active Member of Clubs or Organizations:  Not on file  . Attends BankerClub or Organization Meetings: Not on file  . Marital Status: Not on file  Intimate Partner Violence:   . Fear of Current or Ex-Partner: Not on file  . Emotionally Abused: Not on file  . Physically Abused: Not on file  . Sexually Abused: Not on file    Outpatient Medications Prior to Visit  Medication Sig Dispense Refill  . acetaminophen (TYLENOL) 325 MG tablet Take 2 tablets (650 mg total) by mouth every 4 (four) hours as needed for mild pain ((score 1 to 3) or temp > 100.5).    Marland Kitchen. Alpha-Lipoic Acid 600 MG CAPS Take 1 capsule (600 mg total) by mouth daily. 30 capsule 11  . benazepril-hydrochlorthiazide (LOTENSIN HCT) 20-25 MG tablet Take 1 tablet by mouth daily. 90 tablet 1  . carvedilol (COREG) 6.25 MG  tablet Take 1 tablet (6.25 mg total) by mouth 2 (two) times daily with a meal. 180 tablet 3  . Cholecalciferol (VITAMIN D3) 125 MCG (5000 UT) TABS Take 3 tablets (15,000 Units total) by mouth See admin instructions. Takes 84696 units 5 days a week and 20000 units on 2 days. 30 tablet 0  . Cyanocobalamin (VITAMIN B 12) 100 MCG LOZG Take 2,500 mg by mouth once a week. 30 lozenge 0  . cyclobenzaprine (FLEXERIL) 10 MG tablet Take 1 tablet (10 mg total) by mouth daily as needed for muscle spasms. . 90 tablet 0  . ferrous sulfate 325 (65 FE) MG tablet Take 1 tablet (325 mg total) by mouth daily with breakfast. 30 tablet 3  . furosemide (LASIX) 20 MG tablet Pt take 40 mg Tuesday and Saturday 20 mg all other days 90 tablet 3  . gabapentin (NEURONTIN) 300 MG capsule Take 300 mg by mouth See admin instructions. Take 4 in am, 2 pm, 4 at bedtime    . levothyroxine (SYNTHROID) 125 MCG tablet Only take 1 tab Tues and Fridays (Patient taking differently: Take 125 mcg by mouth See admin instructions. Take 1 tablet by mouth on Tuesday and Fridays) 25 tablet 0  . levothyroxine (SYNTHROID) 150 MCG tablet everyday except Tues and Friday- take those days (Patient taking differently:  Take 150 mcg by mouth See admin instructions. Takes everyday except Tues and Friday) 60 tablet 0  . meloxicam (MOBIC) 15 MG tablet TAKE 1 TABLET BY MOUTH DAILY. 90 tablet 1  . pantoprazole (PROTONIX) 40 MG tablet Take 1 tablet (40 mg total) by mouth daily. 90 tablet 0  . rosuvastatin (CRESTOR) 5 MG tablet Take 1 tablet (5 mg total) by mouth daily. 90 tablet 1  . traMADol (ULTRAM) 50 MG tablet TAKE 1 TABLET BY MOUTH 2 TIMES DAILY. MAXIMUM 6 TABLETS PER DAY AS DIRECTED BY PRESCRIBER (Patient taking differently: TAKE 1 TABLET BY MOUTH 2 TIMES DAILY. MAXIMUM 6 TABLETS PER DAY AS DIRECTED BY PRESCRIBER   1-2 daily) 60 tablet 0  . rivaroxaban (XARELTO) 20 MG TABS tablet Take 1 tablet (20 mg total) by mouth daily. 30 tablet 1  . Oxycodone HCl 10 MG TABS Take 1 tablet (10 mg total) by mouth every 3 (three) hours as needed ((score 7 to 10)). (Patient not taking: Reported on 08/23/2020) 30 tablet 0   No facility-administered medications prior to visit.    Allergies  Allergen Reactions  . Cocoa Anaphylaxis  . Red Dye Hives    Rash and Nausea diarrhea  . Lyrica [Pregabalin] Other (See Comments)    Abnormal bleeding  . Other Other (See Comments)    Berries : Rash  . Sulfa Antibiotics Rash    ROS Review of Systems   A fourteen system review of systems was performed and found to be positive as per HPI.  Objective:    Physical Exam General:  Sitting comfortably, in no acute distress  Neuro:  Alert and oriented,  extra-ocular muscles intact, no focal deficits  HEENT:  Normocephalic, atraumatic, neck supple Skin:  no gross rash Cardiac:  RRR, S1 S2 Respiratory: Not using accessory muscles, speaking in full sentences- unlabored. Vascular:  Significant venous statis present, lower extremity edema present (wearing compression socks)   Psych:  No HI/SI, judgement and insight good, Euthymic mood. Full Affect.   BP 109/66   Pulse 75   Ht 5' 5.5" (1.664 m)   Wt 224 lb 3.2 oz (101.7 kg)   SpO2  98%   BMI 36.74 kg/m  Wt Readings from Last 3 Encounters:  08/23/20 224 lb 3.2 oz (101.7 kg)  08/16/20 219 lb 11.2 oz (99.7 kg)  07/28/20 226 lb 9.6 oz (102.8 kg)     Health Maintenance Due  Topic Date Due  . Hepatitis C Screening  Never done  . PNA vac Low Risk Adult (1 of 2 - PCV13) Never done  . COVID-19 Vaccine (2 - Pfizer 2-dose series) 01/31/2020  . INFLUENZA VACCINE  06/27/2020    There are no preventive care reminders to display for this patient.  Lab Results  Component Value Date   TSH 1.380 02/16/2020   Lab Results  Component Value Date   WBC 10.8 (H) 06/07/2020   HGB 11.6 (L) 06/07/2020   HCT 36.2 06/07/2020   MCV 109.0 (H) 06/07/2020   PLT 324 06/07/2020   Lab Results  Component Value Date   NA 138 07/06/2020   K 4.7 07/06/2020   CO2 29 07/06/2020   GLUCOSE 94 07/06/2020   BUN 22 07/06/2020   CREATININE 1.19 (H) 07/06/2020   BILITOT 0.9 05/31/2020   ALKPHOS 86 05/31/2020   AST 54 (H) 05/31/2020   ALT 43 05/31/2020   PROT 6.1 (L) 05/31/2020   ALBUMIN 3.0 (L) 05/31/2020   CALCIUM 9.3 07/06/2020   ANIONGAP 9 05/31/2020   Lab Results  Component Value Date   CHOL 131 02/16/2020   Lab Results  Component Value Date   HDL 67 02/16/2020   Lab Results  Component Value Date   LDLCALC 36 02/16/2020   Lab Results  Component Value Date   TRIG 174 (H) 02/16/2020   Lab Results  Component Value Date   CHOLHDL 2.0 02/16/2020   Lab Results  Component Value Date   HGBA1C 5.3 05/01/2019      Assessment & Plan:   Problem List Items Addressed This Visit      Cardiovascular and Mediastinum   Essential hypertension (Chronic)   Relevant Medications   rivaroxaban (XARELTO) 20 MG TABS tablet   Venous stasis of both lower extremities (Chronic)   Relevant Medications   rivaroxaban (XARELTO) 20 MG TABS tablet     Endocrine   Hypothyroidism (Chronic)     Nervous and Auditory   HNP (herniated nucleus pulposus) with myelopathy, cervical   Spinal  cord compression (HCC) - Primary     Other   Bilateral lower extremity edema    Other Visit Diagnoses    Deep venous thrombosis (DVT) of both peroneal veins, unspecified chronicity (HCC)       Relevant Medications   rivaroxaban (XARELTO) 20 MG TABS tablet      HNP with myelopathy, cervical; Spinal cord compression: -Recovering well, continue to follow up with Physical Med and Rehab. -Recommend to continue with HEP and continue to work on endurance and strength. -Use walker and gradually transition to using cane.  DVT of both peroneal veins: -Discussed with patient continuing Xarelto for another month to complete 3 month therapy. Will send 1 more refill. -Recommend to monitor for symptoms of calf pain, shortness of breath, significant swelling, warmth or erythema.  Essential Hypertension: -Followed by Cardiology- Dr.Harding. -BP initially elevated, BP recheck significantly improved and stable. -Continue current medication regimen. -Continue ambulatory BP monitoring. -Follow a low sodium diet and stay well hydrated. -Will continue to monitor alongside cardiology. -Last BMP stable  Hypothyroid: -Asymptomatic. -Last TSH wnl -Continue current medication regimen. -Will continue to monitor.  Bilateral lower extremity edema, Chronic venous stasis: -  Continue to wear compression socks, elevation and follow a low sodium diet. -Continue Lasix as needed for edema  Mixed hyperlipidemia: -Last lipid panel wnl -Pt and husband inquired about statin side effects especially with patient's history of neuropathy and generalized weakness. Discussed with patient and husband repeating lipid panel next OV and if it continues to be at goal then it is reasonable to trial taking 0.5 tablet of Crestor 5 mg. Both verbalized understanding and are agreeable. -Recommend to follow a heart healthy diet and reduce cheese. -Will continue to monitor.    Meds ordered this encounter  Medications  .  rivaroxaban (XARELTO) 20 MG TABS tablet    Sig: Take 1 tablet (20 mg total) by mouth daily.    Dispense:  30 tablet    Refill:  0    Follow-up: Return in about 3 months (around 11/22/2020) for HTN, HLD, and FBW (lipid panel, cmp, tsh).   Note:  This note was prepared with assistance of Dragon voice recognition software. Occasional wrong-word or sound-a-like substitutions may have occurred due to the inherent limitations of voice recognition software.  Mayer Masker, PA-C

## 2020-08-23 NOTE — Patient Instructions (Signed)
Deep Vein Thrombosis  Deep vein thrombosis (DVT) is a condition in which a blood clot forms in a deep vein, such as a lower leg, thigh, or arm vein. A clot is blood that has thickened into a gel or solid. This condition is dangerous. It can lead to serious and even life-threatening complications if the clot travels to the lungs and causes a blockage (pulmonary embolism). It can also damage veins in the leg. This can result in leg pain, swelling, discoloration, and sores (post-thrombotic syndrome). What are the causes? This condition may be caused by:  A slowdown of blood flow.  Damage to a vein.  A condition that causes blood to clot more easily, such as an inherited clotting disorder. What increases the risk? The following factors may make you more likely to develop this condition:  Being overweight.  Being older, especially over age 60.  Sitting or lying down for more than four hours.  Being in the hospital.  Lack of physical activity (sedentary lifestyle).  Pregnancy, being in childbirth, or having recently given birth.  Taking medicines that contain estrogen, such as medicines to prevent pregnancy.  Smoking.  A history of any of the following: ? Blood clots or a blood clotting disease. ? Peripheral vascular disease. ? Inflammatory bowel disease. ? Cancer. ? Heart disease. ? Genetic conditions that affect how your blood clots, such as Factor V Leiden mutation. ? Neurological diseases that affect your legs (leg paresis). ? A recent injury, such as a car accident. ? Major or lengthy surgery. ? A central line placed inside a large vein. What are the signs or symptoms? Symptoms of this condition include:  Swelling, pain, or tenderness in an arm or leg.  Warmth, redness, or discoloration in an arm or leg. If the clot is in your leg, symptoms may be more noticeable or worse when you stand or walk. Some people may not develop any symptoms. How is this diagnosed? This  condition is diagnosed with:  A medical history and physical exam.  Tests, such as: ? Blood tests. These are done to check how well your blood clots. ? Ultrasound. This is done to check for clots. ? Venogram. For this test, contrast dye is injected into a vein and X-rays are taken to check for any clots. How is this treated? Treatment for this condition depends on:  The cause of your DVT.  Your risk for bleeding or developing more clots.  Any other medical conditions that you have. Treatment may include:  Taking a blood thinner (anticoagulant). This type of medicine prevents clots from forming. It may be taken by mouth, injected under the skin, or injected through an IV (catheter).  Injecting clot-dissolving medicines into the affected vein (catheter-directed thrombolysis).  Having surgery. Surgery may be done to: ? Remove the clot. ? Place a filter in a large vein to catch blood clots before they reach the lungs. Some treatments may be continued for up to six months. Follow these instructions at home: If you are taking blood thinners:  Take the medicine exactly as told by your health care provider. Some blood thinners need to be taken at the same time every day. Do not skip a dose.  Talk with your health care provider before you take any medicines that contain aspirin or NSAIDs. These medicines increase your risk for dangerous bleeding.  Ask your health care provider about foods and drugs that could change the way the medicine works (may interact). Avoid those things if your   health care provider tells you to do so.  Blood thinners can cause easy bruising and may make it difficult to stop bleeding. Because of this: ? Be very careful when using knives, scissors, or other sharp objects. ? Use an electric razor instead of a blade. ? Avoid activities that could cause injury or bruising, and follow instructions about how to prevent falls.  Wear a medical alert bracelet or carry a  card that lists what medicines you take. General instructions  Take over-the-counter and prescription medicines only as told by your health care provider.  Return to your normal activities as told by your health care provider. Ask your health care provider what activities are safe for you.  Wear compression stockings if recommended by your health care provider.  Keep all follow-up visits as told by your health care provider. This is important. How is this prevented? To lower your risk of developing this condition again:  For 30 or more minutes every day, do an activity that: ? Involves moving your arms and legs. ? Increases your heart rate.  When traveling for longer than four hours: ? Exercise your arms and legs every hour. ? Drink plenty of water. ? Avoid drinking alcohol.  Avoid sitting or lying for a long time without moving your legs.  If you have surgery or you are hospitalized, ask about ways to prevent blood clots. These may include taking frequent walks or using anticoagulants.  Stay at a healthy weight.  If you are a woman who is older than age 32, avoid unnecessary use of medicines that contain estrogen, such as some birth control pills.  Do not use any products that contain nicotine or tobacco, such as cigarettes and e-cigarettes. This is especially important if you take estrogen medicines. If you need help quitting, ask your health care provider. Contact a health care provider if:  You miss a dose of your blood thinner.  Your menstrual period is heavier than usual.  You have unusual bruising. Get help right away if:  You have: ? New or increased pain, swelling, or redness in an arm or leg. ? Numbness or tingling in an arm or leg. ? Shortness of breath. ? Chest pain. ? A rapid or irregular heartbeat. ? A severe headache or confusion. ? A cut that will not stop bleeding.  There is blood in your vomit, stool, or urine.  You have a serious fall or accident,  or you hit your head.  You feel light-headed or dizzy.  You cough up blood. These symptoms may represent a serious problem that is an emergency. Do not wait to see if the symptoms will go away. Get medical help right away. Call your local emergency services (911 in the [place] Do not drive yourself to the hospital. Summary  Deep vein thrombosis (DVT) is a condition in which a blood clot forms in a deep vein, such as a lower leg, thigh, or arm vein.  Symptoms can include swelling, warmth, pain, and redness in your leg or arm.  This condition may be treated with a blood thinner (anticoagulant medicine), medicine that is injected to dissolve blood clots,compression stockings, or surgery.  If you are prescribed blood thinners, take them exactly as told. This information is not intended to replace advice given to you by your health care provider. Make sure you discuss any questions you have with your health care provider. Document Revised: 10/26/2017 Document Reviewed: 04/13/2017 Elsevier Patient Education  2020 [employer]. Hypertension, Adult Hypertension is  another name for high blood pressure. High blood pressure forces your heart to work harder to pump blood. This can cause problems over time. There are two numbers in a blood pressure reading. There is a top number (systolic) over a bottom number (diastolic). It is best to have a blood pressure that is below 120/80. Healthy choices can help lower your blood pressure, or you may need medicine to help lower it. What are the causes? The cause of this condition is not known. Some conditions may be related to high blood pressure. What increases the risk?  Smoking.  Having type 2 diabetes mellitus, high cholesterol, or both.  Not getting enough exercise or physical activity.  Being overweight.  Having too much fat, sugar, calories, or salt (sodium) in your diet.  Drinking too much alcohol.  Having long-term (chronic) kidney  disease.  Having a family history of high blood pressure.  Age. Risk increases with age.  Race. You may be at higher risk if you are African American.  Gender. Men are at higher risk than women before age 63. After age 2, women are at higher risk than men.  Having obstructive sleep apnea.  Stress. What are the signs or symptoms?  High blood pressure may not cause symptoms. Very high blood pressure (hypertensive crisis) may cause: ? Headache. ? Feelings of worry or nervousness (anxiety). ? Shortness of breath. ? Nosebleed. ? A feeling of being sick to your stomach (nausea). ? Throwing up (vomiting). ? Changes in how you see. ? Very bad chest pain. ? Seizures. How is this treated?  This condition is treated by making healthy lifestyle changes, such as: ? Eating healthy foods. ? Exercising more. ? Drinking less alcohol.  Your health care provider may prescribe medicine if lifestyle changes are not enough to get your blood pressure under control, and if: ? Your top number is above 130. ? Your bottom number is above 80.  Your personal target blood pressure may vary. Follow these instructions at home: Eating and drinking   If told, follow the DASH eating plan. To follow this plan: ? Fill one half of your plate at each meal with fruits and vegetables. ? Fill one fourth of your plate at each meal with whole grains. Whole grains include whole-wheat pasta, brown rice, and whole-grain bread. ? Eat or drink low-fat dairy products, such as skim milk or low-fat yogurt. ? Fill one fourth of your plate at each meal with low-fat (lean) proteins. Low-fat proteins include fish, chicken without skin, eggs, beans, and tofu. ? Avoid fatty meat, cured and processed meat, or chicken with skin. ? Avoid pre-made or processed food.  Eat less than 1,500 mg of salt each day.  Do not drink alcohol if: ? Your doctor tells you not to drink. ? You are pregnant, may be pregnant, or are planning  to become pregnant.  If you drink alcohol: ? Limit how much you use to:  0-1 drink a day for women.  0-2 drinks a day for men. ? Be aware of how much alcohol is in your drink. In the [place] one drink equals one 12 oz bottle of beer (355 mL), one 5 oz glass of wine (148 mL), or one 1 oz glass of hard liquor (44 mL). Lifestyle   Work with your doctor to stay at a healthy weight or to lose weight. Ask your doctor what the best weight is for you.  Get at least 30 minutes of exercise most days of the  week. This may include walking, swimming, or biking.  Get at least 30 minutes of exercise that strengthens your muscles (resistance exercise) at least 3 days a week. This may include lifting weights or doing Pilates.  Do not use any products that contain nicotine or tobacco, such as cigarettes, e-cigarettes, and chewing tobacco. If you need help quitting, ask your doctor.  Check your blood pressure at home as told by your doctor.  Keep all follow-up visits as told by your doctor. This is important. Medicines  Take over-the-counter and prescription medicines only as told by your doctor. Follow directions carefully.  Do not skip doses of blood pressure medicine. The medicine does not work as well if you skip doses. Skipping doses also puts you at risk for problems.  Ask your doctor about side effects or reactions to medicines that you should watch for. Contact a doctor if you:  Think you are having a reaction to the medicine you are taking.  Have headaches that keep coming back (recurring).  Feel dizzy.  Have swelling in your ankles.  Have trouble with your vision. Get help right away if you:  Get a very bad headache.  Start to feel mixed up (confused).  Feel weak or numb.  Feel faint.  Have very bad pain in your: ? Chest. ? Belly (abdomen).  Throw up more than once.  Have trouble breathing. Summary  Hypertension is another name for high blood pressure.  High blood  pressure forces your heart to work harder to pump blood.  For most people, a normal blood pressure is less than 120/80.  Making healthy choices can help lower blood pressure. If your blood pressure does not get lower with healthy choices, you may need to take medicine. This information is not intended to replace advice given to you by your health care provider. Make sure you discuss any questions you have with your health care provider. Document Revised: 07/24/2018 Document Reviewed: 07/24/2018 Elsevier Patient Education  2020 [employer].

## 2020-08-24 ENCOUNTER — Other Ambulatory Visit: Payer: Self-pay | Admitting: Physician Assistant

## 2020-09-06 ENCOUNTER — Other Ambulatory Visit: Payer: Self-pay | Admitting: Sports Medicine

## 2020-09-06 DIAGNOSIS — M170 Bilateral primary osteoarthritis of knee: Secondary | ICD-10-CM

## 2020-09-07 ENCOUNTER — Other Ambulatory Visit: Payer: Self-pay | Admitting: Physician Assistant

## 2020-09-23 ENCOUNTER — Encounter: Payer: Self-pay | Admitting: Physician Assistant

## 2020-09-23 ENCOUNTER — Other Ambulatory Visit: Payer: Self-pay

## 2020-09-23 ENCOUNTER — Ambulatory Visit (INDEPENDENT_AMBULATORY_CARE_PROVIDER_SITE_OTHER): Payer: Medicare PPO | Admitting: Physician Assistant

## 2020-09-23 VITALS — BP 126/74 | HR 75 | Ht 65.5 in | Wt 227.0 lb

## 2020-09-23 DIAGNOSIS — K449 Diaphragmatic hernia without obstruction or gangrene: Secondary | ICD-10-CM

## 2020-09-23 DIAGNOSIS — L97921 Non-pressure chronic ulcer of unspecified part of left lower leg limited to breakdown of skin: Secondary | ICD-10-CM | POA: Diagnosis not present

## 2020-09-23 DIAGNOSIS — L03119 Cellulitis of unspecified part of limb: Secondary | ICD-10-CM | POA: Diagnosis not present

## 2020-09-23 DIAGNOSIS — L97911 Non-pressure chronic ulcer of unspecified part of right lower leg limited to breakdown of skin: Secondary | ICD-10-CM | POA: Diagnosis not present

## 2020-09-23 MED ORDER — DOXYCYCLINE HYCLATE 100 MG PO TABS: 100.0000 mg | ORAL_TABLET | Freq: Two times a day (BID) | ORAL | 0 refills | Status: DC

## 2020-09-23 NOTE — Progress Notes (Signed)
Acute Office Visit  Subjective:    Patient ID: [name], female    DOB: [birth year], 74 y.o.   MRN: [id number]  Chief Complaint  Patient presents with  . Open Wound    multiple open wounds on bileteral legs  . Cellulitis    HPI Patient is in today for multiple ulcers of the lower extremities, L>R. Patient reports initially started as small blisters and over the past 3 weeks have increased in size and transformed to open wounds with drainage. Denies systemic symptoms including fever.  Has tried to applied topical antibiotic which she does not stay on due to drainage. States in the past saw wound care for similar episode. Feels like redness has improved. Also requesting referral for hiatal hernia.   Past Medical History:  Diagnosis Date  . Alcohol abuse 04/25/2015   reportedly drinks ~ 1 bottle of wine / day  . Allergy   . Anastomotic ulcer S/P gastric bypass 06/18/2016  . Arthritis   . Cataract   . Chronic diastolic heart failure ([place]) 10/25/2017  . Degenerative disc disease, cervical   . DVT, bilateral lower limbs ([place]) 05/2020   Age-indeterminate bilateral PERONEAL VEIN DVTs --> (was postop from acute cord compression, started on Xarelto Dosepak  . Essential hypertension 04/25/2015  . GAD (generalized anxiety disorder) 06/14/2016  . HNP (herniated nucleus pulposus) with myelopathy, cervical 05/27/2020   Cervical Myelopathy with Spinal cord compression (HCC)  --> s/p Anterior Cervical Discectomy Decompression)   . Hypothyroidism   . 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 UGIB (upper gastrointestinal bleed) 04/25/2015  . Venous stasis of both lower extremities 01/10/2019    Past Surgical History:  Procedure Laterality Date  . ANTERIOR CERVICAL DECOMP/DISCECTOMY FUSION Left 05/26/2020   Procedure: Cervical seven - Thoracic one ANTERIOR CERVICAL DISCECTOMY AND PLATING;  Surgeon: Newman Pies, MD;  Location: [facility];   Service: Neurosurgery;  Laterality: Left;  . CHOLECYSTECTOMY    . COSMETIC SURGERY    . DIAGNOSTIC LAPAROSCOPY    . ESOPHAGOGASTRODUODENOSCOPY N/A 04/25/2015   Procedure: ESOPHAGOGASTRODUODENOSCOPY (EGD);  Surgeon: Jerene Bears, MD;  Location[facility]COPY;  Service: Endoscopy;  Laterality: N/A;  . FRACTURE SURGERY    . GASTRIC BYPASS    . NM MYOVIEW LTD  10/10/2017   Normal LV size and function EF 72%.  Medium sized mild severity defect in the apical septal apical lateral and apical wall suggestive of breast attenuation.  LOW RISK.  No ischemia or infarction.    . OPEN REDUCTION INTERNAL FIXATION (ORIF) DISTAL RADIAL FRACTURE Right 01/15/2013   Procedure: OPEN REDUCTION INTERNAL FIXATION (ORIF) Right DISTAL RADIUS FRACTURE;  Surgeon: Marybelle Killings, MD;  Location: [facility];  Service: Orthopedics;  Laterality: Right;  . TRANSTHORACIC ECHOCARDIOGRAM  10/04/2017   Mild LVH.  EF 60-65%.  Grade 2/moderate diastolic dysfunction.  Mild pulmonary hypertension.  No valve lesions    Family History  Problem Relation Age of Onset  . Heart disease Mother   . Stroke Mother 29  . Cancer Mother        skin and breast  . Hypertension Mother   . Breast cancer Mother 14  . COPD Father   . Cancer Father   . Diabetes Father   . Hypertension Father   . Stroke Maternal Grandmother   . Mental illness Maternal Grandmother   . Breast cancer Sister   . Cancer Cousin 23    Social History  Socioeconomic History  . Marital status: Married    Spouse name: Not on file  . Number of children: Not on file  . Years of education: Not on file  . Highest education level: Not on file  Occupational History  . Occupation: Therapist, sports    Comment: ICU Tree surgeon  Tobacco Use  . Smoking status: Never Smoker  . Smokeless tobacco: Never Used  Vaping Use  . Vaping Use: Never used  Substance and Sexual Activity  . Alcohol use: Yes    Alcohol/week: 28.0 standard drinks    Types: 28 Glasses of wine per week    Comment:  drinks daily and has a bottle of wine per day  . Drug use: No  . Sexual activity: Yes    Birth control/protection: Post-menopausal  Other Topics Concern  . 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 Difficulty of Paying Living Expenses: Not on file  Food Insecurity:   . Worried About Charity fundraiser in the Last Year: Not on file  . Ran Out of Food in the Last Year: Not on file  Transportation Needs:   . Lack of Transportation (Medical): Not on file  . Lack of Transportation (Non-Medical): Not on file  Physical Activity:   . Days of Exercise per Week: Not on file  . Minutes of Exercise per Session: Not on file  Stress:   . Feeling of Stress : Not on file  Social Connections:   . Frequency of Communication with Friends and Family: Not on file  . Frequency of Social Gatherings with Friends and Family: Not on file  . Attends Religious Services: Not on file  . Active Member of Clubs or Organizations: Not on file  . Attends Archivist Meetings: Not on file  . Marital Status: Not on file  Intimate Partner Violence:   . Fear of Current or Ex-Partner: Not on file  . Emotionally Abused: Not on file  . Physically Abused: Not on file  . Sexually Abused: Not on file    Outpatient Medications Prior to Visit  Medication Sig Dispense Refill  . acetaminophen (TYLENOL) 325 MG tablet Take 2 tablets (650 mg total) by mouth every 4 (four) hours as needed for mild pain ((score 1 to 3) or temp > 100.5).    Marland Kitchen Alpha-Lipoic Acid 600 MG CAPS Take 1 capsule (600 mg total) by mouth daily. 30 capsule 11  . benazepril-hydrochlorthiazide (LOTENSIN HCT) 20-25 MG tablet Take 1 tablet by mouth daily. 90 tablet 1  . carvedilol (COREG) 6.25 MG tablet Take 1 tablet (6.25 mg  total) by mouth 2 (two) times daily with a meal. 180 tablet 3  . Cholecalciferol (VITAMIN D3) 125 MCG (5000 UT) TABS Take 3 tablets (15,000 Units total) by mouth See admin instructions. Takes 15000 units 5 days a week and 20000 units on 2 days. 30 tablet 0  . Cyanocobalamin (VITAMIN B 12) 100 MCG LOZG Take 2,500 mg by mouth once a week. 30 lozenge 0  . cyclobenzaprine (FLEXERIL) 10 MG tablet Take 1 tablet (10 mg total) by mouth daily as needed for muscle spasms. . 90 tablet 0  . ferrous sulfate 325 (65  FE) MG tablet Take 1 tablet (325 mg total) by mouth daily with breakfast. 30 tablet 3  . furosemide (LASIX) 20 MG tablet Pt take 40 mg Tuesday and Saturday 20 mg all other days 90 tablet 3  . gabapentin (NEURONTIN) 300 MG capsule TAKE 4 CAPSULES BY MOUTH EVERY MORNING, 3 CAPSULES IN THE AFTERNOON, AND 4 CAPSULES AT NIGHT 990 capsule 0  . levothyroxine (SYNTHROID) 125 MCG tablet Only take 1 tab Tues and Fridays (Patient taking differently: Take 125 mcg by mouth See admin instructions. Take 1 tablet by mouth on Tuesday and Fridays) 25 tablet 0  . levothyroxine (SYNTHROID) 150 MCG tablet everyday except Tues and Friday- take 134mcg those days (Patient taking differently: Take 150 mcg by mouth See admin instructions. Takes everyday except Tues and Friday) 60 tablet 0  . meloxicam (MOBIC) 15 MG tablet TAKE 1 TABLET BY MOUTH DAILY. 90 tablet 1  . Mupirocin 2 % KIT Apply topically.    . pantoprazole (PROTONIX) 40 MG tablet TAKE 1 TABLET BY MOUTH DAILY. 90 tablet 0  . rivaroxaban (XARELTO) 20 MG TABS tablet Take 1 tablet (20 mg total) by mouth daily. 30 tablet 0  . rosuvastatin (CRESTOR) 5 MG tablet Take 1 tablet (5 mg total) by mouth daily. 90 tablet 1  . traMADol (ULTRAM) 50 MG tablet TAKE 1 TABLET BY MOUTH 2 TIMES DAILY. MAX 6 TABLETS PER DAY AS DIRECTED BY PRESCRIBER 60 tablet 0   No facility-administered medications prior to visit.    Allergies  Allergen Reactions  . Cocoa Anaphylaxis  . Red Dye  Hives    Rash and Nausea diarrhea  . Lyrica [Pregabalin] Other (See Comments)    Abnormal bleeding  . Other Other (See Comments)    Berries : Rash  . Sulfa Antibiotics Rash    Review of Systems A fourteen system review of systems was performed and found to be positive as per HPI.    Objective:    Physical Exam General:  Well Developed, well nourished, appropriate for stated age.  Neuro:  Alert and oriented,  extra-ocular muscles intact, no focal deficits  HEENT:  Normocephalic, atraumatic Skin: Multiple superficial ulcers with serosanguineous drainage, some warmth to touch and erythema. Respiratory: Not using accessory muscles, speaking in full sentences- unlabored. Vascular:  Ext warm, no cyanosis apprec.; cap RF less 2 sec. Edema noted Psych:  No HI/SI, judgement and insight good, Euthymic mood. Full Affect.   BP 126/74   Pulse 75   Ht 5' 5.5" (1.664 m)   Wt 227 lb (103 kg)   SpO2 94%   BMI 37.20 kg/m  Wt Readings from Last 3 Encounters:  09/23/20 227 lb (103 kg)  08/23/20 224 lb 3.2 oz (101.7 kg)  08/16/20 219 lb 11.2 oz (99.7 kg)    Health Maintenance Due  Topic Date Due  . Hepatitis C Screening  Never done  . PNA vac Low Risk Adult (1 of 2 - PCV13) Never done  . COVID-19 Vaccine (2 - Pfizer 2-dose series) 01/31/2020  . INFLUENZA VACCINE  06/27/2020    There are no preventive care reminders to display for this patient.   Lab Results  Component Value Date   TSH 1.380 02/16/2020   Lab Results  Component Value Date   WBC 10.8 (H) 06/07/2020   HGB 11.6 (L) 06/07/2020   HCT 36.2 06/07/2020   MCV 109.0 (H) 06/07/2020   PLT 324 06/07/2020   Lab Results  Component Value Date   NA 138 07/06/2020  K 4.7 07/06/2020   CO2 29 07/06/2020   GLUCOSE 94 07/06/2020   BUN 22 07/06/2020   CREATININE 1.19 (H) 07/06/2020   BILITOT 0.9 05/31/2020   ALKPHOS 86 05/31/2020   AST 54 (H) 05/31/2020   ALT 43 05/31/2020   PROT 6.1 (L) 05/31/2020   ALBUMIN 3.0 (L)  05/31/2020   CALCIUM 9.3 07/06/2020   ANIONGAP 9 05/31/2020   Lab Results  Component Value Date   CHOL 131 02/16/2020   Lab Results  Component Value Date   HDL 67 02/16/2020   Lab Results  Component Value Date   LDLCALC 36 02/16/2020   Lab Results  Component Value Date   TRIG 174 (H) 02/16/2020   Lab Results  Component Value Date   CHOLHDL 2.0 02/16/2020   Lab Results  Component Value Date   HGBA1C 5.3 05/01/2019       Assessment & Plan:   Problem List Items Addressed This Visit    None    Visit Diagnoses    Ulcers of both lower extremities, limited to breakdown of skin ([place])    -  Primary   Relevant Medications   doxycycline (VIBRA-TABS) 100 MG tablet   Other Relevant Orders   Ambulatory referral to Vascular Surgery   Hiatal hernia       Relevant Orders   Ambulatory referral to General Surgery   Cellulitis of lower extremity, unspecified laterality       Relevant Medications   doxycycline (VIBRA-TABS) 100 MG tablet     Ulcers of both lower extremities, limited to breakdown of skin, Cellulitis of lower extremity: -Will place referral to Vascular Surgery. -Patient reports has taken Doxycyline in the past without issues so will send prescription. -Recommend to keep area clean and dry. -If symptoms worsen or fail to improve before Vascular consultation advised to follow up.    Hiatal Hernia: -Placed referral to General Surgery.  Meds ordered this encounter  Medications  . doxycycline (VIBRA-TABS) 100 MG tablet    Sig: Take 1 tablet (100 mg total) by mouth 2 (two) times daily.    Dispense:  20 tablet    Refill:  0    Order Specific Question:   Supervising Provider    Answer:   Beatrice Lecher D [[id number]]   Note:  This note was prepared with assistance of Dragon voice recognition software. Occasional wrong-word or sound-a-like substitutions may have occurred due to the inherent limitations of voice recognition software.   Lorrene Reid, PA-C

## 2020-09-30 ENCOUNTER — Telehealth: Payer: Self-pay | Admitting: Physician Assistant

## 2020-09-30 NOTE — Telephone Encounter (Signed)
Please follow up on referral.  Tiajuana Amass, CMA

## 2020-09-30 NOTE — Telephone Encounter (Signed)
Patient has not heard back from the vascular office. Can you please advise, she was seen recently. Thanks

## 2020-10-01 ENCOUNTER — Other Ambulatory Visit: Payer: Self-pay

## 2020-10-01 DIAGNOSIS — I878 Other specified disorders of veins: Secondary | ICD-10-CM

## 2020-10-01 DIAGNOSIS — T887XXA Unspecified adverse effect of drug or medicament, initial encounter: Secondary | ICD-10-CM

## 2020-10-01 DIAGNOSIS — R2689 Other abnormalities of gait and mobility: Secondary | ICD-10-CM

## 2020-10-01 DIAGNOSIS — R29898 Other symptoms and signs involving the musculoskeletal system: Secondary | ICD-10-CM

## 2020-10-01 NOTE — Progress Notes (Signed)
Pt's spouse states that pt needs an order for a scooter for tax purposes.  Order entered.  Tiajuana Amass, CMA

## 2020-10-05 DIAGNOSIS — M5020 Other cervical disc displacement, unspecified cervical region: Secondary | ICD-10-CM | POA: Diagnosis not present

## 2020-10-05 DIAGNOSIS — M5003 Cervical disc disorder with myelopathy, cervicothoracic region: Secondary | ICD-10-CM | POA: Diagnosis not present

## 2020-10-11 ENCOUNTER — Ambulatory Visit (INDEPENDENT_AMBULATORY_CARE_PROVIDER_SITE_OTHER): Payer: Medicare PPO | Admitting: Surgery

## 2020-10-11 ENCOUNTER — Other Ambulatory Visit: Payer: Self-pay

## 2020-10-11 ENCOUNTER — Encounter: Payer: Self-pay | Admitting: Surgery

## 2020-10-11 VITALS — BP 137/77 | HR 82 | Temp 98.3°F | Ht 65.5 in | Wt 226.0 lb

## 2020-10-11 DIAGNOSIS — K439 Ventral hernia without obstruction or gangrene: Secondary | ICD-10-CM | POA: Diagnosis not present

## 2020-10-11 NOTE — Patient Instructions (Addendum)
We want you to have some lab work done. You may have this done at [facility], [facility] entrance, or you may have this done at any [facility] hospital if it is more convenient. Or you may have your primary care provider order this to be done in office(CBC and CMP). This would need to be completed before your CT scan.   We will set you up for a CT of the abdomen and pelvis with contrast to better assess the hernia and internal structures. We will call you about your scan.  You should work on cutting down on your drinking.

## 2020-10-12 ENCOUNTER — Telehealth: Payer: Self-pay | Admitting: Physician Assistant

## 2020-10-12 NOTE — Telephone Encounter (Signed)
Patient has been scheduled for a CT abdomen/pelvis with contrast at [facility] on 11/08/20 at 11:00 am  She will arrive there by 10:40 am and have only clear liquids for 4 hours prior. She will need to pick up a prep kit. Patient verbalizes understanding. She will follow up in the office after her scan results are back.

## 2020-10-13 NOTE — Progress Notes (Signed)
Patient ID: [name], female   DOB: 02/26/1946, 74 y.o.   MRN: [id number]  HPI [name] is a 74 y.o. female seen in consultation for ventral hernia.  Of note the patient has had a history of laparoscopic Roux-en-Y gastric bypass in 2003 and did well.  She does have other significant comorbidities including obesity with a BMI of 37, decreased mobility uses a walker to move around, history of DVT on Xarelto and EtOH abuse. She reports that she has ventral hernias that they have been bothering her for a while.  She is able to reduce the hernia.  Denies any evidence of incarceration or strangulation.  She is a retired Banker.  She is accompanied by her husband.  She has followed by Dr. Ellyn Hack from cardiology. She has had also a cervical fusion and has chronic back pain. He has also had a history of cholecystectomy in the remote past She did have labs 4 months ago showing a decreased albumin of 3.0 slightly elevated sugar rest of the labs were pretty unremarkable including a CBC and [name]    HPI  Past Medical History:  Diagnosis Date  . Alcohol abuse 04/25/2015   reportedly drinks ~ 1 bottle of wine / day  . Allergy   . Anastomotic ulcer S/P gastric bypass 06/18/2016  . Arthritis   . Cataract   . Chronic diastolic heart failure ([place]) 10/25/2017  . Degenerative disc disease, cervical   . DVT, bilateral lower limbs ([place]) 05/2020   Age-indeterminate bilateral PERONEAL VEIN DVTs --> (was postop from acute cord compression, started on Xarelto Dosepak  . Essential hypertension 04/25/2015  . GAD (generalized anxiety disorder) 06/14/2016  . HNP (herniated nucleus pulposus) with myelopathy, cervical 05/27/2020   Cervical Myelopathy with Spinal cord compression (HCC)  --> s/p Anterior Cervical Discectomy Decompression)   . Hypothyroidism   . 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 UGIB (upper gastrointestinal bleed)  04/25/2015  . Venous stasis of both lower extremities 01/10/2019    Past Surgical History:  Procedure Laterality Date  . ANTERIOR CERVICAL DECOMP/DISCECTOMY FUSION Left 05/26/2020   Procedure: Cervical seven - Thoracic one ANTERIOR CERVICAL DISCECTOMY AND PLATING;  Surgeon: Newman Pies, MD;  Location: [place];  Service: Neurosurgery;  Laterality: Left;  . CHOLECYSTECTOMY    . COSMETIC SURGERY    . ESOPHAGOGASTRODUODENOSCOPY N/A 04/25/2015   Procedure: ESOPHAGOGASTRODUODENOSCOPY (EGD);  Surgeon: Jerene Bears, MD;  Location: [facility] ENDOSCOPY;  Service: Endoscopy;  Laterality: N/A;  . FRACTURE SURGERY    . GASTRIC BYPASS  2003  . gastric ulcer repair  04/25/2015  . NM MYOVIEW LTD  10/10/2017   Normal LV size and function EF 72%.  Medium sized mild severity defect in the apical septal apical lateral and apical wall suggestive of breast attenuation.  LOW RISK.  No ischemia or infarction.    . OPEN REDUCTION INTERNAL FIXATION (ORIF) DISTAL RADIAL FRACTURE Right 01/15/2013   Procedure: OPEN REDUCTION INTERNAL FIXATION (ORIF) Right DISTAL RADIUS FRACTURE;  Surgeon: Marybelle Killings, MD;  Location: [place];  Service: Orthopedics;  Laterality: Right;  . SPINE SURGERY    . TRANSTHORACIC ECHOCARDIOGRAM  10/04/2017   Mild LVH.  EF 60-65%.  Grade 2/moderate diastolic dysfunction.  Mild pulmonary hypertension.  No valve lesions    Family History  Problem Relation Age of Onset  . Heart disease Mother   . Stroke Mother 81  . Cancer Mother  skin and breast  . Hypertension Mother   . Breast cancer Mother 65  . COPD Father   . Cancer Father   . Diabetes Father   . Hypertension Father   . Stroke Maternal Grandmother   . Mental illness Maternal Grandmother   . Breast cancer Sister   . Cancer Cousin 25    Social History Social History   Tobacco Use  . Smoking status: Never Smoker  . Smokeless tobacco: Never Used  Vaping Use  . Vaping Use: Never used  Substance Use Topics  . Alcohol use: Yes     Alcohol/week: 28.0 standard drinks    Types: 28 Glasses of wine per week    Comment: drinks daily and has a bottle and half of wine per day  . Drug use: No    Allergies  Allergen Reactions  . Cocoa Anaphylaxis  . Red Dye Hives    Rash and Nausea diarrhea  . Lyrica [Pregabalin] Other (See Comments)    Abnormal bleeding  . Other Other (See Comments)    Berries : Rash  . Sulfa Antibiotics Rash    Current Outpatient Medications  Medication Sig Dispense Refill  . acetaminophen (TYLENOL) 325 MG tablet Take 2 tablets (650 mg total) by mouth every 4 (four) hours as needed for mild pain ((score 1 to 3) or temp > 100.5).    Marland Kitchen Alpha-Lipoic Acid 600 MG CAPS Take 1 capsule (600 mg total) by mouth daily. 30 capsule 11  . benazepril-hydrochlorthiazide (LOTENSIN HCT) 20-25 MG tablet Take 1 tablet by mouth daily. 90 tablet 1  . carvedilol (COREG) 6.25 MG tablet Take 1 tablet (6.25 mg total) by mouth 2 (two) times daily with a meal. 180 tablet 3  . Cholecalciferol (VITAMIN D3) 125 MCG (5000 UT) TABS Take 3 tablets (15,000 Units total) by mouth See admin instructions. Takes 15000 units 5 days a week and 20000 units on 2 days. 30 tablet 0  . Cyanocobalamin (VITAMIN B 12) 100 MCG LOZG Take 2,500 mg by mouth once a week. 30 lozenge 0  . cyclobenzaprine (FLEXERIL) 10 MG tablet Take 1 tablet (10 mg total) by mouth daily as needed for muscle spasms. . 90 tablet 0  . ferrous sulfate 325 (65 FE) MG tablet Take 1 tablet (325 mg total) by mouth daily with breakfast. 30 tablet 3  . furosemide (LASIX) 20 MG tablet Pt take 40 mg Tuesday and Saturday 20 mg all other days 90 tablet 3  . gabapentin (NEURONTIN) 300 MG capsule TAKE 4 CAPSULES BY MOUTH EVERY MORNING, 3 CAPSULES IN THE AFTERNOON, AND 4 CAPSULES AT NIGHT 990 capsule 0  . levothyroxine (SYNTHROID) 125 MCG tablet Only take 1 tab Tues and Fridays (Patient taking differently: Take 125 mcg by mouth See admin instructions. Take 1 tablet by mouth on Tuesday and  Fridays) 25 tablet 0  . levothyroxine (SYNTHROID) 150 MCG tablet everyday except Tues and Friday- take 138mcg those days (Patient taking differently: Take 150 mcg by mouth See admin instructions. Takes everyday except Tues and Friday) 60 tablet 0  . meloxicam (MOBIC) 15 MG tablet TAKE 1 TABLET BY MOUTH DAILY. 90 tablet 1  . Mupirocin 2 % KIT Apply topically.    . pantoprazole (PROTONIX) 40 MG tablet TAKE 1 TABLET BY MOUTH DAILY. 90 tablet 0  . rosuvastatin (CRESTOR) 5 MG tablet Take 1 tablet (5 mg total) by mouth daily. 90 tablet 1  . traMADol (ULTRAM) 50 MG tablet TAKE 1 TABLET BY MOUTH 2 TIMES DAILY.  MAX 6 TABLETS PER DAY AS DIRECTED BY PRESCRIBER 60 tablet 0   No current facility-administered medications for this visit.     Review of Systems Full ROS  was asked and was negative except for the information on the HPI  Physical Exam Blood pressure 137/77, pulse 82, temperature 98.3 F (36.8 C), height 5' 5.5" (1.664 m), weight 226 lb (102.5 kg). CONSTITUTIONAL: NAD, she is using  a walker but is able to stand up on her own and go to the stretcher slowly EYES: Pupils are equal, round,  Sclera are non-icteric. EARS, NOSE, MOUTH AND THROAT:She is wearing a mask, Hearing is intact to voice. LYMPH NODES:  Lymph nodes in the neck are normal. RESPIRATORY:  Lungs are clear. There is normal respiratory effort, with equal breath sounds bilaterally, and without pathologic use of accessory muscles. CARDIOVASCULAR: Heart is regular without murmurs, gallops, or rubs. GI: The abdomen is  soft, large pannus with reducible ventral hernia.  It is a pretty decent size however due to generous pannus I cannot definitively assess if there are other multiple defects.  No evidence of peritonitis GU: Rectal deferred.   MUSCULOSKELETAL: Normal muscle strength and tone.  There is pedal edema SKIN: Turgor is good and there are no pathologic skin lesions or ulcers. NEUROLOGIC: Motor and sensation is grossly normal.  Cranial nerves are grossly intact. PSYCH:  Oriented to person, place and time. Affect is normal.  Data Reviewed  I have personally reviewed the patient's imaging, laboratory findings and medical records.    Assessment/Plan 74 year old female with a BMI of 37 significant mobility issues and presents with a symptomatic ventral hernia.  Like to obtain a CT scan of the abdomen pelvis to evaluate abdominal wall anatomy.  I will also like for her to optimize her weight as well as to avoid alcohol.  She understands this and is willing to do that.  We will also obtain some new lab to make sure that her liver function is still well as well as the other parameters.  I will see her back in a few weeks once as she completed appropriate work-up.  Currently there is no need for surgical intervention.  Potentially she might be a candidate for robotic approach depending on CT findings.   Time spent with the patient was 45 minutes, with more than 50% of the time spent in face-to-face education, counseling and care coordination.     Caroleen Hamman, MD FACS General Surgeon 10/13/2020, 2:43 PM

## 2020-10-15 ENCOUNTER — Other Ambulatory Visit: Payer: Self-pay

## 2020-10-15 ENCOUNTER — Other Ambulatory Visit: Payer: Medicare PPO

## 2020-10-15 DIAGNOSIS — K439 Ventral hernia without obstruction or gangrene: Secondary | ICD-10-CM | POA: Diagnosis not present

## 2020-10-15 NOTE — Addendum Note (Signed)
Addended by: Stan Head on: 10/15/2020 09:59 AM   Modules accepted: Orders

## 2020-10-16 LAB — COMPREHENSIVE METABOLIC PANEL
ALT: 14 IU/L (ref 0–32)
AST: 21 IU/L (ref 0–40)
Albumin/Globulin Ratio: 1.9 (ref 1.2–2.2)
Albumin: 4.3 g/dL (ref 3.7–4.7)
Alkaline Phosphatase: 104 IU/L (ref 44–121)
BUN/Creatinine Ratio: 22 (ref 12–28)
BUN: 23 mg/dL (ref 8–27)
Bilirubin Total: 0.6 mg/dL (ref 0.0–1.2)
CO2: 28 mmol/L (ref 20–29)
Calcium: 9.1 mg/dL (ref 8.7–10.3)
Chloride: 95 mmol/L — ABNORMAL LOW (ref 96–106)
Creatinine, Ser: 1.04 mg/dL — ABNORMAL HIGH (ref 0.57–1.00)
GFR calc Af Amer: 62 mL/min/{1.73_m2} (ref >59)
GFR calc non Af Amer: 53 mL/min/{1.73_m2} — ABNORMAL LOW (ref >59)
Globulin, Total: 2.3 g/dL (ref 1.5–4.5)
Glucose: 99 mg/dL (ref 65–99)
Potassium: 4.3 mmol/L (ref 3.5–5.2)
Sodium: 136 mmol/L (ref 134–144)
Total Protein: 6.6 g/dL (ref 6.0–8.5)

## 2020-10-16 LAB — CBC WITH DIFFERENTIAL/PLATELET
Basophils Absolute: 0.1 10*3/uL (ref 0.0–0.2)
Basos: 2 %
EOS (ABSOLUTE): 0.3 10*3/uL (ref 0.0–0.4)
Eos: 4 %
Hematocrit: 38.9 % (ref 34.0–46.6)
Hemoglobin: 13.5 g/dL (ref 11.1–15.9)
Immature Grans (Abs): 0 10*3/uL (ref 0.0–0.1)
Immature Granulocytes: 0 %
Lymphocytes Absolute: 2.4 10*3/uL (ref 0.7–3.1)
Lymphs: 39 %
MCH: 35.6 pg — ABNORMAL HIGH (ref 26.6–33.0)
MCHC: 34.7 g/dL (ref 31.5–35.7)
MCV: 103 fL — ABNORMAL HIGH (ref 79–97)
Monocytes Absolute: 0.5 10*3/uL (ref 0.1–0.9)
Monocytes: 8 %
Neutrophils Absolute: 2.9 10*3/uL (ref 1.4–7.0)
Neutrophils: 47 %
Platelets: 234 10*3/uL (ref 150–450)
RBC: 3.79 x10E6/uL (ref 3.77–5.28)
RDW: 12.3 % (ref 11.7–15.4)
WBC: 6.2 10*3/uL (ref 3.4–10.8)

## 2020-10-18 ENCOUNTER — Telehealth: Payer: Self-pay

## 2020-10-18 ENCOUNTER — Other Ambulatory Visit: Payer: Self-pay | Admitting: Sports Medicine

## 2020-10-18 DIAGNOSIS — M170 Bilateral primary osteoarthritis of knee: Secondary | ICD-10-CM

## 2020-10-18 NOTE — Telephone Encounter (Signed)
Patient notified of results and reminded of 11/10/20 appointment.

## 2020-10-19 ENCOUNTER — Other Ambulatory Visit: Payer: Self-pay | Admitting: Physician Assistant

## 2020-10-19 DIAGNOSIS — E782 Mixed hyperlipidemia: Secondary | ICD-10-CM

## 2020-10-26 ENCOUNTER — Telehealth: Payer: Self-pay | Admitting: Physician Assistant

## 2020-10-26 DIAGNOSIS — E782 Mixed hyperlipidemia: Secondary | ICD-10-CM

## 2020-10-26 MED ORDER — ROSUVASTATIN CALCIUM 5 MG PO TABS: 5.0000 mg | ORAL_TABLET | Freq: Every day | ORAL | 0 refills | Status: DC

## 2020-10-26 NOTE — Telephone Encounter (Signed)
Refill sent to the pharmacy. AS, CMA

## 2020-10-26 NOTE — Addendum Note (Signed)
Addended by: Sylvester Harder on: 10/26/2020 11:59 AM   Modules accepted: Orders

## 2020-10-26 NOTE — Telephone Encounter (Signed)
Patient needs a refill on Crestor and would like it sent to [facility]. Thanks

## 2020-10-27 NOTE — Telephone Encounter (Signed)
Patient is scheduled w/[facility] on 11/15/20 @ 2:45pm.

## 2020-11-01 ENCOUNTER — Other Ambulatory Visit: Payer: Medicare PPO

## 2020-11-04 ENCOUNTER — Other Ambulatory Visit: Payer: Self-pay | Admitting: *Deleted

## 2020-11-04 DIAGNOSIS — R609 Edema, unspecified: Secondary | ICD-10-CM

## 2020-11-08 ENCOUNTER — Ambulatory Visit
Admission: RE | Admit: 2020-11-08 | Discharge: 2020-11-08 | Disposition: A | Discharge status: Home / Self Care | Payer: Medicare PPO | Source: Ambulatory Visit | Attending: Surgery | Admitting: Surgery

## 2020-11-08 DIAGNOSIS — K439 Ventral hernia without obstruction or gangrene: Secondary | ICD-10-CM

## 2020-11-08 DIAGNOSIS — D7389 Other diseases of spleen: Secondary | ICD-10-CM | POA: Diagnosis not present

## 2020-11-08 DIAGNOSIS — K760 Fatty (change of) liver, not elsewhere classified: Secondary | ICD-10-CM | POA: Diagnosis not present

## 2020-11-08 DIAGNOSIS — K429 Umbilical hernia without obstruction or gangrene: Secondary | ICD-10-CM | POA: Diagnosis not present

## 2020-11-08 IMAGING — CT CT ABD-PELV W/ CM
1 of 3 series · 13 of 32 positions shown, 19 images · IV contrast (APPLIED)
Comparison: None.

CLINICAL DATA: Ventral hernia.

EXAM:
CT ABDOMEN AND PELVIS WITH CONTRAST
TECHNIQUE: Multidetector CT imaging of the abdomen and pelvis was performed
using the standard protocol following bolus administration of
intravenous contrast.
CONTRAST:  100mL [GA] IOPAMIDOL ([GA]) INJECTION 61%

[Series 2: abd/pelvis w/cm · axial · 0.88mm/px · z∈[-478,-78]mm · 13 of 94 slices shown, 19 images]
[im 7/94  soft-tissue]
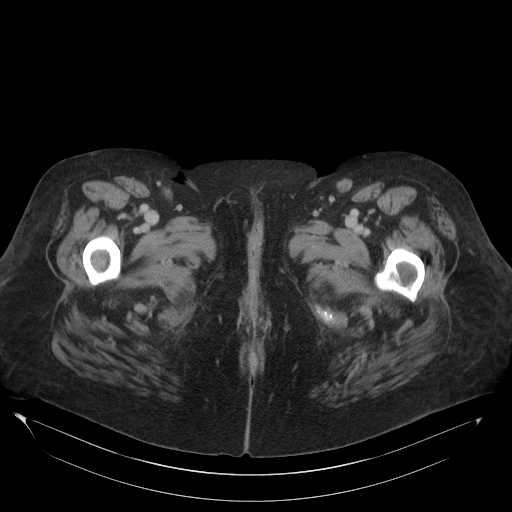
[im 7/94  bone]
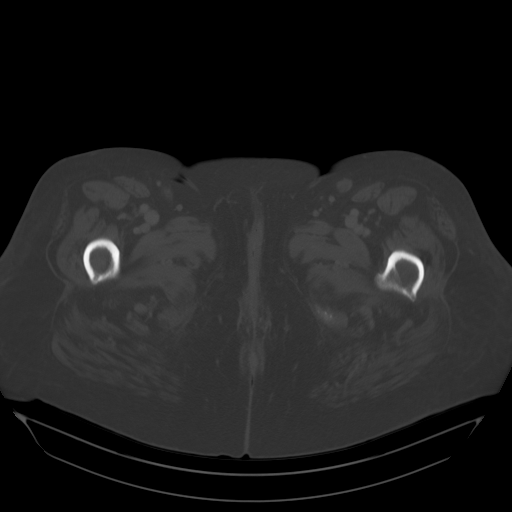
[im 14/94  soft-tissue]
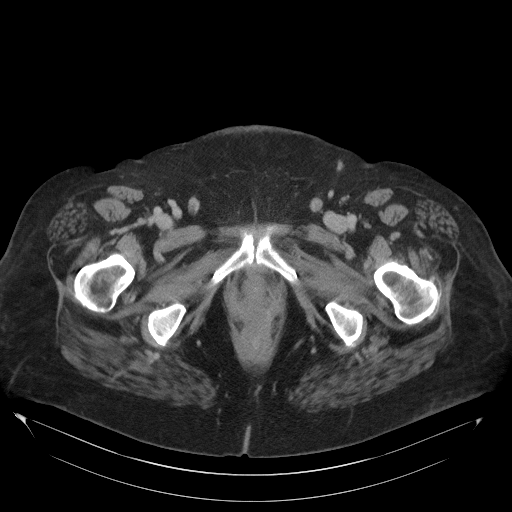
[im 20/94  soft-tissue]
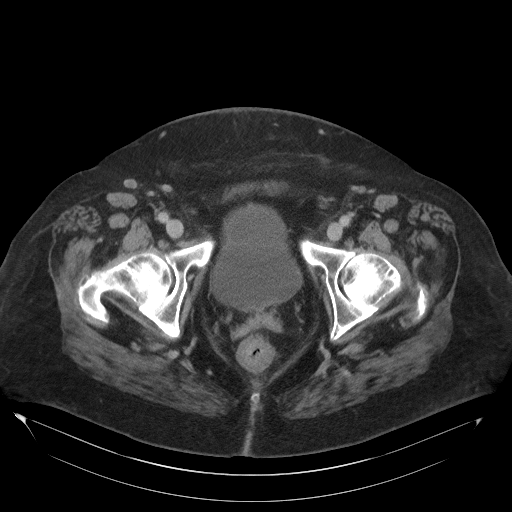
[im 27/94  soft-tissue]
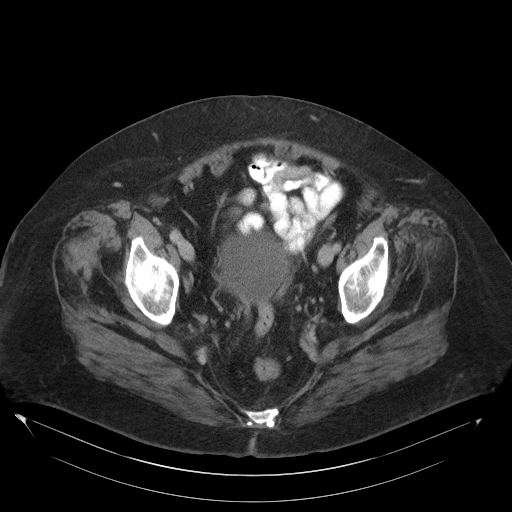
[im 34/94  soft-tissue]
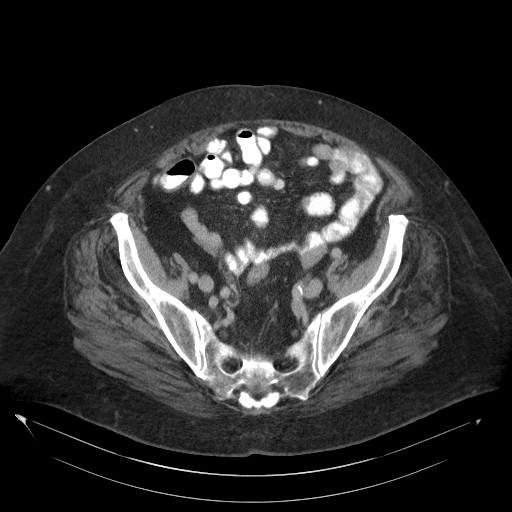
[im 40/94  soft-tissue]
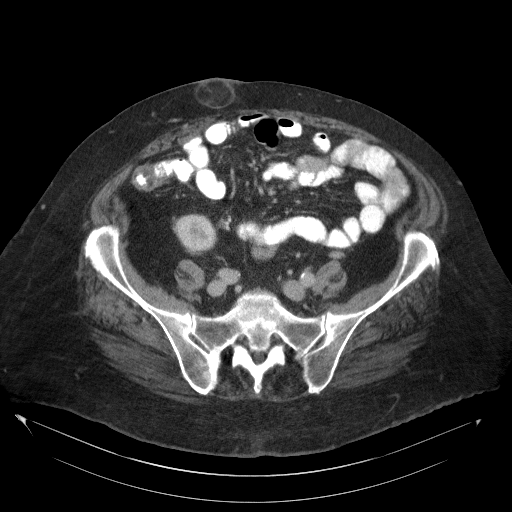
[im 47/94  soft-tissue]
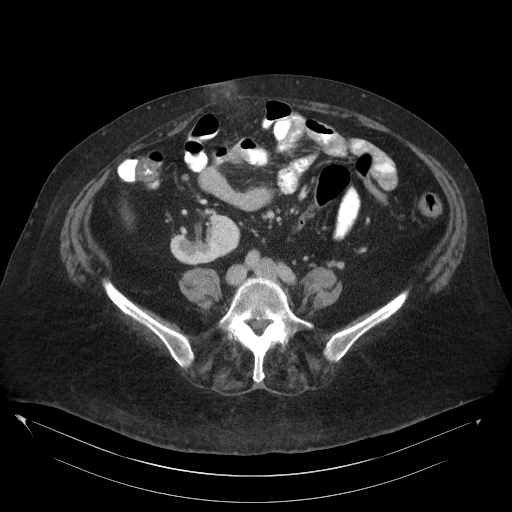
[im 54/94  soft-tissue]
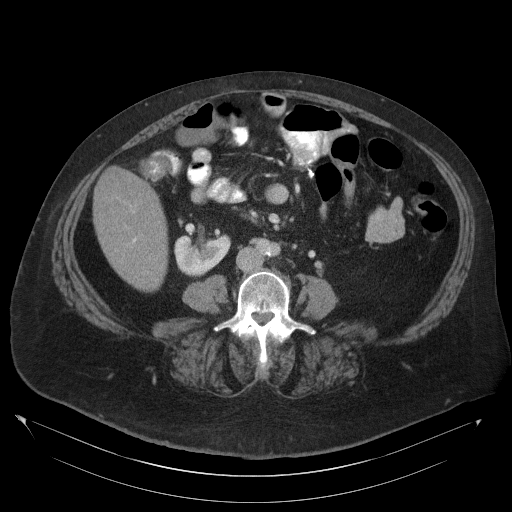
[im 60/94  soft-tissue]
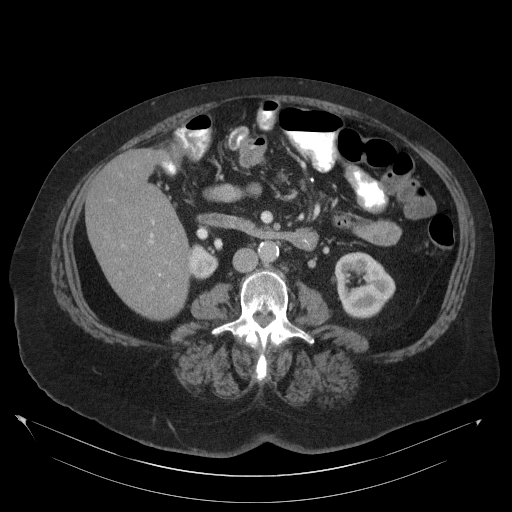
[im 60/94  bone]
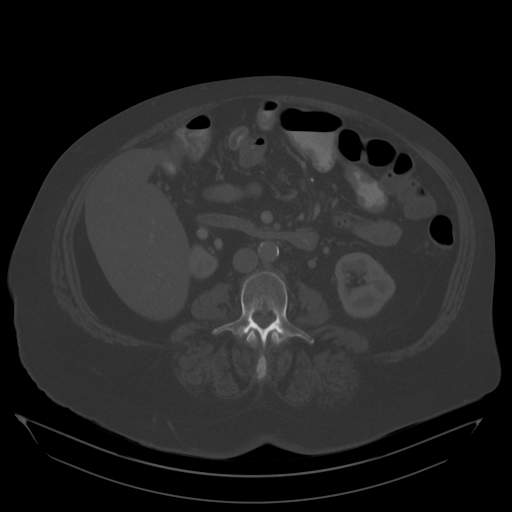
[im 67/94  soft-tissue]
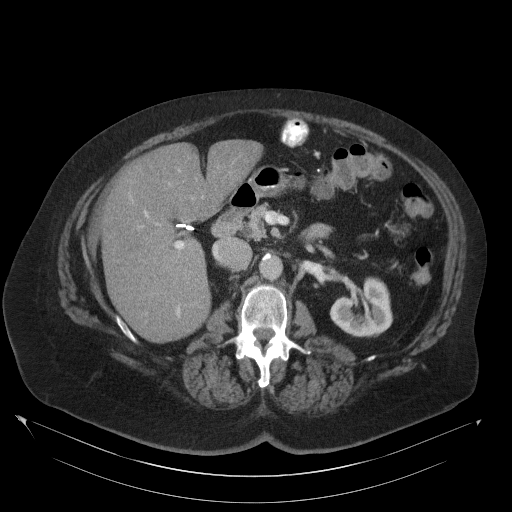
[im 67/94  lung]
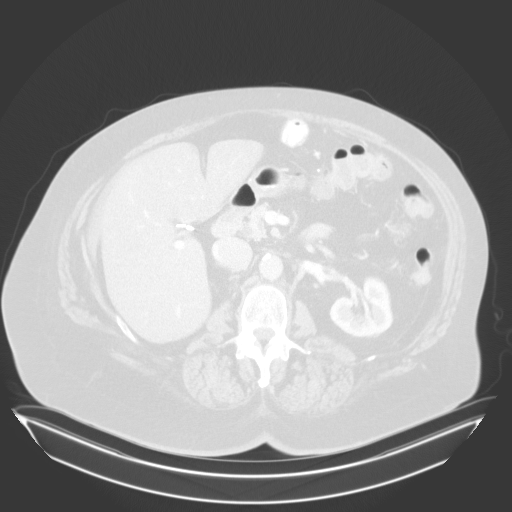
[im 74/94  soft-tissue]
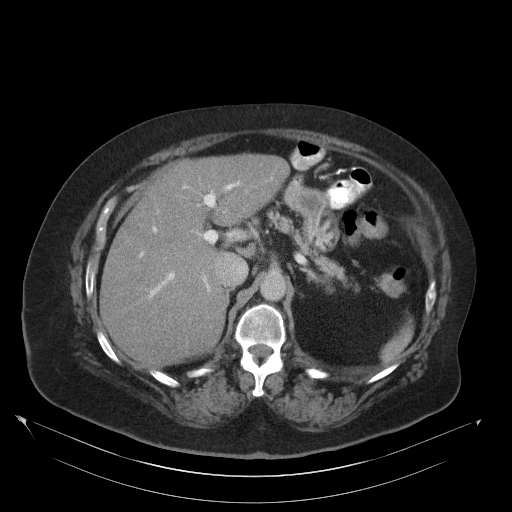
[im 74/94  lung]
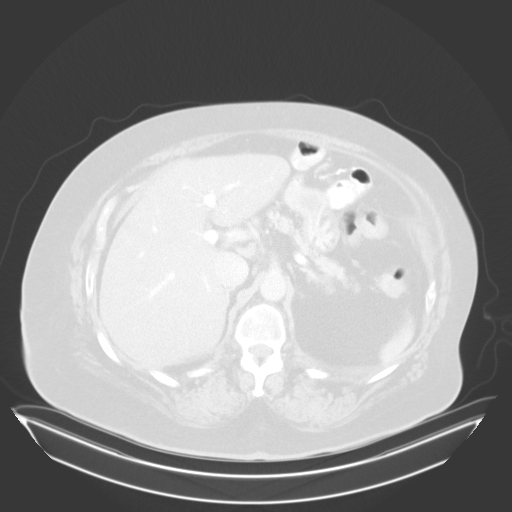
[im 80/94  soft-tissue]
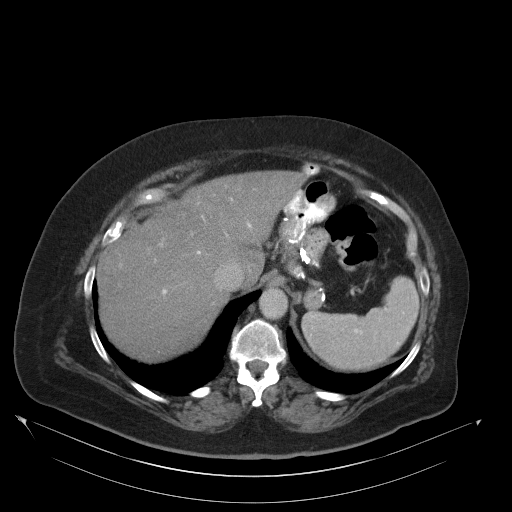
[im 80/94  lung]
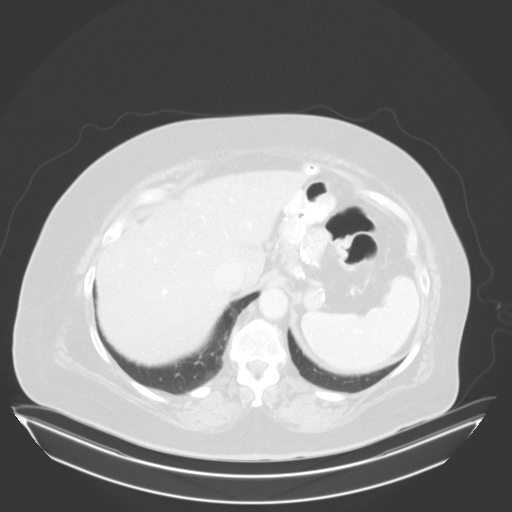
[im 87/94  soft-tissue]
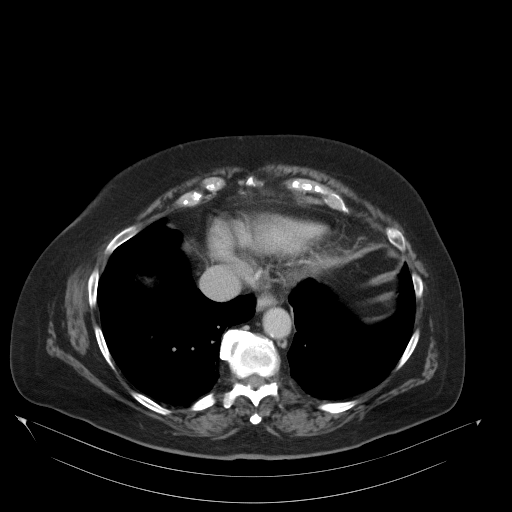
[im 87/94  lung]
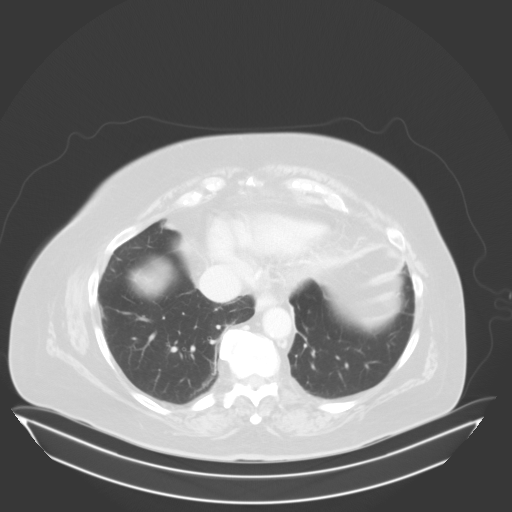

[13 of 32 positions shown; findings below may reference images not displayed]

FINDINGS: Lower chest: No acute abnormality.

Hepatobiliary: Status post cholecystectomy. No biliary dilatation is
noted. Mild hepatic steatosis is noted.

Pancreas: Unremarkable. No pancreatic ductal dilatation or
surrounding inflammatory changes.

Spleen: Calcified splenic granulomata are noted.

Adrenals/Urinary Tract: Adrenal glands appear normal. There appears
to be congenital malrotation of the right kidney. No hydronephrosis
or renal obstruction is noted. No renal or ureteral calculi are
noted. Urinary bladder is unremarkable.

Stomach/Bowel: Status post gastric surgery. There is no evidence of
bowel obstruction. The appendix appears normal.

Vascular/Lymphatic: Aortic atherosclerosis. No enlarged abdominal or
pelvic lymph nodes.

Reproductive: Status post hysterectomy. No adnexal masses.

Other: Moderate size fat containing periumbilical hernia is noted.
No ascites is noted.

Musculoskeletal: No acute or significant osseous findings.
IMPRESSION: 1. Moderate size fat containing periumbilical hernia.
2. Mild hepatic steatosis.
3. No acute abnormality seen in the abdomen or pelvis.
4. Aortic atherosclerosis.

Aortic Atherosclerosis ([GA]-[GA]).

## 2020-11-08 MED ORDER — IOPAMIDOL (ISOVUE-300) INJECTION 61%
100.0000 mL | Freq: Once | INTRAVENOUS | Status: AC | PRN
  Administered 2020-11-08: 100 mL via INTRAVENOUS

## 2020-11-09 ENCOUNTER — Telehealth: Payer: Self-pay

## 2020-11-09 NOTE — Telephone Encounter (Signed)
Pt called back for results. Verbalizes understanding. 

## 2020-11-09 NOTE — Telephone Encounter (Signed)
LVM for pt to call back for results.

## 2020-11-10 ENCOUNTER — Telehealth: Payer: Self-pay | Admitting: Surgery

## 2020-11-10 ENCOUNTER — Other Ambulatory Visit: Payer: Self-pay

## 2020-11-10 ENCOUNTER — Encounter: Payer: Self-pay | Admitting: Surgery

## 2020-11-10 ENCOUNTER — Ambulatory Visit: Payer: Medicare PPO | Admitting: Surgery

## 2020-11-10 VITALS — BP 123/75 | HR 64 | Temp 98.1°F | Ht 65.5 in | Wt 225.6 lb

## 2020-11-10 DIAGNOSIS — K439 Ventral hernia without obstruction or gangrene: Secondary | ICD-10-CM | POA: Diagnosis not present

## 2020-11-10 NOTE — Telephone Encounter (Signed)
Outgoing call is made, left message for patient to call.  Please advise patient of Pre-Admission date/time, COVID Testing date and Surgery date.  Surgery Date: 12/02/20 Preadmission Testing Date: 11/25/20 (phone 8a-1p) Covid Testing Date: 11/30/20 - patient advised to [facility] ([facility]) between 8a-1p   Also patient needs to call at [phone], between 1-3:00pm the day before surgery, to find out what time to arrive for surgery.

## 2020-11-10 NOTE — H&P (View-Only) (Signed)
Outpatient Surgical Follow Up ° °11/10/2020 ° °[name] is an 74 y.o. female.  ° °Chief Complaint  °Patient presents with  °• Follow-up  °  Discuss hernia repair surgery  ° ° °HPI: 73-year-old female well-known to me with a prior history of laparoscopic Roux-en-Y gastric bypass as well as cholecystectomy comes in with a symptomatic ventral hernia.  I will order a CT scan that I have personally reviewed showing evidence of about 4 cm ventral hernia around the umbilicus.  There is no evidence of incarceration or strangulation.  There is no other acute abnormalities.  He has been evaluated by her cardiologist and deemed to be a reasonable risk.  Recent CBC and CMP were completely normal. ° °Past Medical History:  °Diagnosis Date  °• Alcohol abuse 04/25/2015  ° reportedly drinks ~ 1 bottle of wine / day  °• Allergy   °• Anastomotic ulcer S/P gastric bypass 06/18/2016  °• Arthritis   °• Cataract   °• Chronic diastolic heart failure (HCC) 10/25/2017  °• Degenerative disc disease, cervical   °• DVT, bilateral lower limbs (HCC) 05/2020  ° Age-indeterminate bilateral PERONEAL VEIN DVTs --> (was postop from acute cord compression, started on Xarelto Dosepak  °• Essential hypertension 04/25/2015  °• GAD (generalized anxiety disorder) 06/14/2016  °• HNP (herniated nucleus pulposus) with myelopathy, cervical 05/27/2020  ° Cervical Myelopathy with Spinal cord compression (HCC)  --> s/p Anterior Cervical Discectomy Decompression)   °• Hypothyroidism   °• Kidney damage   ° 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  °• UGIB (upper gastrointestinal bleed) 04/25/2015  °• Venous stasis of both lower extremities 01/10/2019  ° ° °Past Surgical History:  °Procedure Laterality Date  °• ANTERIOR CERVICAL DECOMP/DISCECTOMY FUSION Left 05/26/2020  ° Procedure: Cervical seven - Thoracic one ANTERIOR CERVICAL DISCECTOMY AND PLATING;  Surgeon: Jenkins, Jeffrey, MD;  Location: [facility];  Service: Neurosurgery;   Laterality: Left;  °• CHOLECYSTECTOMY    °• COSMETIC SURGERY    °• ESOPHAGOGASTRODUODENOSCOPY N/A 04/25/2015  ° Procedure: ESOPHAGOGASTRODUODENOSCOPY (EGD);  Surgeon: Jay M Pyrtle, MD;  Location: [facility] ENDOSCOPY;  Service: Endoscopy;  Laterality: N/A;  °• FRACTURE SURGERY    °• GASTRIC BYPASS  2003  °• gastric ulcer repair  04/25/2015  °• NM MYOVIEW LTD  10/10/2017  ° Normal LV size and function EF 72%.  Medium sized mild severity defect in the apical septal apical lateral and apical wall suggestive of breast attenuation.  LOW RISK.  No ischemia or infarction.    °• OPEN REDUCTION INTERNAL FIXATION (ORIF) DISTAL RADIAL FRACTURE Right 01/15/2013  ° Procedure: OPEN REDUCTION INTERNAL FIXATION (ORIF) Right DISTAL RADIUS FRACTURE;  Surgeon: Mark C Yates, MD;  Location: [facility] OR;  Service: Orthopedics;  Laterality: Right;  °• SPINE SURGERY    °• TRANSTHORACIC ECHOCARDIOGRAM  10/04/2017  ° Mild LVH.  EF 60-65%.  Grade 2/moderate diastolic dysfunction.  Mild pulmonary hypertension.  No valve lesions  ° ° °Family History  °Problem Relation Age of Onset  °• Heart disease Mother   °• Stroke Mother 90  °• Cancer Mother   °     skin and breast  °• Hypertension Mother   °• Breast cancer Mother 80  °• COPD Father   °• Cancer Father   °• Diabetes Father   °• Hypertension Father   °• Stroke Maternal Grandmother   °• Mental illness Maternal Grandmother   °• Breast cancer Sister   °• Cancer Cousin 40  ° ° °Social History:    reports that she has never smoked. She has never used smokeless tobacco. She reports current alcohol use of about 28.0 standard drinks of alcohol per week. She reports that she does not use drugs. ° °Allergies:  °Allergies  °Allergen Reactions  °• Cocoa Anaphylaxis  °• Red Dye Hives  °  Rash and Nausea diarrhea  °• Lyrica [Pregabalin] Other (See Comments)  °  Abnormal bleeding  °• Other Other (See Comments)  °  Berries : Rash  °• Sulfa Antibiotics Rash  ° ° °Medications reviewed. ° ° ° °ROS °Full ROS performed and is  otherwise negative other than what is stated in HPI ° ° °BP 123/75    Pulse 64    Temp 98.1 °F (36.7 °C) (Oral)    Ht 5' 5.5" (1.664 m)    Wt 225 lb 9.6 oz (102.3 kg)    SpO2 97%    BMI 36.97 kg/m²  ° °Physical Exam ° °CONSTITUTIONAL: NAD, she is using  a walker but is able to stand up on her own and go to the stretcher slowly °EYES: Pupils are equal, round,  Sclera are non-icteric. °EARS, NOSE, MOUTH AND THROAT:She is wearing a mask, Hearing is intact to voice. °LYMPH NODES:  Lymph nodes in the neck are normal. °RESPIRATORY:  Lungs are clear. There is normal respiratory effort, with equal breath sounds bilaterally, and without pathologic use of accessory muscles. °CARDIOVASCULAR: Heart is regular without murmurs, gallops, or rubs. °GI: The abdomen is  soft,  pannus with reducible ventral hernia. No evidence of peritonitis GU: Rectal deferred.   °MUSCULOSKELETAL: Normal muscle strength and tone.  There is pedal edema °SKIN: Turgor is good and there are no pathologic skin lesions or ulcers. °NEUROLOGIC: Motor and sensation is grossly normal. Cranial nerves are grossly intact. °PSYCH:  Oriented to person, place and time. Affect is normal. °  ° ° °Assessment/Plan: °74-year-old female with incisional hernia.  Discussed with patient in detail about her disease process.  Different approaches of robotic versus open repair.  I do think that given the fact that she had multiple abdominal operations a direct open approach might be in her best interest.  Procedure discussed with patient in detail.  Risks, benefits and possible occasions including but not limited to: Bleeding, infection, recurrence, bowel injuries, prolonged pain.  She understands and wishes to proceed. ° °Greater than 50% of the 45 minutes  visit was spent in counseling/coordination of care ° ° °[name] Matney, MD FACS °General Surgeon °

## 2020-11-10 NOTE — Patient Instructions (Addendum)
Our surgery scheduler Britta Mccreedy will give you a call with your appointment within 24-48 hours. Have your blue sheet available when she calls to write down important information. If you have not heard anything by Monday, please give our office a call.     Laparoscopic Ventral Hernia Repair, Care After This sheet gives you information about how to care for yourself after your procedure. Your health care provider may also give you more specific instructions. If you have problems or questions, contact your health care provider. What can I expect after the procedure? After the procedure, it is common to have:  Pain, discomfort, or soreness. Follow these instructions at home: Incision care   Follow instructions from your health care provider about how to take care of your incision. Make sure you: ? Wash your hands with soap and water before you change your bandage (dressing) or before you touch your abdomen. If soap and water are not available, use hand sanitizer. ? Change your dressing as told by your health care provider. ? Leave stitches (sutures), skin glue, or adhesive strips in place. These skin closures may need to stay in place for 2 weeks or longer. If adhesive strip edges start to loosen and curl up, you may trim the loose edges. Do not remove adhesive strips completely unless your health care provider tells you to do that.  Check your incision area every day for signs of infection. Check for: ? Redness, swelling, or pain. ? Fluid or blood. ? Warmth. ? Pus or a bad smell. Bathing   Do not take baths, swim, or use a hot tub until your health care provider approves. Ask your health care provider if you can take showers. You may only be allowed to take sponge baths for bathing.  Keep your bandage (dressing) dry until your health care provider says it can be removed. Activity  Do not lift anything that is heavier than 10 lb (4.5 kg) until your health care provider approves.  Do not  drive or use heavy machinery while taking prescription pain medicine. Ask your health care provider when it is safe for you to drive or use heavy machinery.  Do not drive for 24 hours if you were given a medicine to help you relax (sedative) during your procedure.  Rest as told by your health care provider. You may return to your normal activities when your health care provider approves. General instructions  Take over-the-counter and prescription medicines only as told by your health care provider.  To prevent or treat constipation while you are taking prescription pain medicine, your health care provider may recommend that you: ? Take over-the-counter or prescription medicines. ? Eat foods that are high in fiber, such as fresh fruits and vegetables, whole grains, and beans. ? Limit foods that are high in fat and processed sugars, such as fried and sweet foods.  Drink enough fluid to keep your urine clear or pale yellow.  Hold a pillow over your abdomen when you cough or sneeze. This helps with pain.  Keep all follow-up visits as told by your health care provider. This is important. Contact a health care provider if:  You have: ? A fever or chills. ? Redness, swelling, or pain around your incision. ? Fluid or blood coming from your incision. ? Pus or a bad smell coming from your incision. ? Pain that gets worse or does not get better with medicine. ? Nausea or vomiting. ? A cough. ? Shortness of breath.  Your incision  feels warm to the touch.  You have not had a bowel movement in three days.  You are not able to urinate. Get help right away if:  You have severe pain in your abdomen.  You have persistent nausea and vomiting.  You have redness, warmth, or pain in your leg.  You have chest pain.  You have trouble breathing. Summary  After this procedure, it is common to have pain, discomfort, or soreness.  Follow instructions from your health care provider about how  to take care of your incision.  Check your incision area every day for signs of infection. Report any signs of infection to your health care provider.  Keep all follow-up visits as told by your health care provider. This is important. This information is not intended to replace advice given to you by your health care provider. Make sure you discuss any questions you have with your health care provider. Document Revised: 10/26/2017 Document Reviewed: 07/05/2016 Elsevier Patient Education  2020 [employer].

## 2020-11-10 NOTE — Progress Notes (Signed)
Outpatient Surgical Follow Up  11/10/2020  [name] is an 74 y.o. female.   Chief Complaint  Patient presents with   Follow-up    Discuss hernia repair surgery    HPI: 74 year old female well-known to me with a prior history of laparoscopic Roux-en-Y gastric bypass as well as cholecystectomy comes in with a symptomatic ventral hernia.  I will order a CT scan that I have personally reviewed showing evidence of about 4 cm ventral hernia around the umbilicus.  There is no evidence of incarceration or strangulation.  There is no other acute abnormalities.  He has been evaluated by her cardiologist and deemed to be a reasonable risk.  Recent CBC and CMP were completely normal.  Past Medical History:  Diagnosis Date   Alcohol abuse 04/25/2015   reportedly drinks ~ 1 bottle of wine / day   Allergy    Anastomotic ulcer S/P gastric bypass 06/18/2016   Arthritis    Cataract    Chronic diastolic heart failure (HCC) 10/25/2017   Degenerative disc disease, cervical    DVT, bilateral lower limbs (HCC) 05/2020   Age-indeterminate bilateral PERONEAL VEIN DVTs --> (was postop from acute cord compression, started on Xarelto Dosepak   Essential hypertension 04/25/2015   GAD (generalized anxiety disorder) 06/14/2016   HNP (herniated nucleus pulposus) with myelopathy, cervical 05/27/2020   Cervical Myelopathy with Spinal cord compression (HCC)  --> s/p Anterior Cervical Discectomy Decompression)    Hypothyroidism    Kidney damage    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   UGIB (upper gastrointestinal bleed) 04/25/2015   Venous stasis of both lower extremities 01/10/2019    Past Surgical History:  Procedure Laterality Date   ANTERIOR CERVICAL DECOMP/DISCECTOMY FUSION Left 05/26/2020   Procedure: Cervical seven - Thoracic one ANTERIOR CERVICAL DISCECTOMY AND PLATING;  Surgeon: Tressie Stalker, MD;  [facility] OR;  Service: Neurosurgery;   Laterality: Left;   CHOLECYSTECTOMY     COSMETIC SURGERY     ESOPHAGOGASTRODUODENOSCOPY N/A 04/25/2015   Procedure: ESOPHAGOGASTRODUODENOSCOPY (EGD);  Surgeon: Beverley Fiedler, MD;  Location: [facility] ENDOSCOPY;  Service: Endoscopy;  Laterality: N/A;   FRACTURE SURGERY     GASTRIC BYPASS  2003   gastric ulcer repair  04/25/2015   NM MYOVIEW LTD  10/10/2017   Normal LV size and function EF 72%.  Medium sized mild severity defect in the apical septal apical lateral and apical wall suggestive of breast attenuation.  LOW RISK.  No ischemia or infarction.     OPEN REDUCTION INTERNAL FIXATION (ORIF) DISTAL RADIAL FRACTURE Right 01/15/2013   Procedure: OPEN REDUCTION INTERNAL FIXATION (ORIF) Right DISTAL RADIUS FRACTURE;  Surgeon: Eldred Manges, MD;  Location: [facility] OR;  Service: Orthopedics;  Laterality: Right;   SPINE SURGERY     TRANSTHORACIC ECHOCARDIOGRAM  10/04/2017   Mild LVH.  EF 60-65%.  Grade 2/moderate diastolic dysfunction.  Mild pulmonary hypertension.  No valve lesions    Family History  Problem Relation Age of Onset   Heart disease Mother    Stroke Mother 41   Cancer Mother        skin and breast   Hypertension Mother    Breast cancer Mother 34   COPD Father    Cancer Father    Diabetes Father    Hypertension Father    Stroke Maternal Grandmother    Mental illness Maternal Grandmother    Breast cancer Sister    Cancer Cousin 13    Social History:  reports that she has never smoked. She has never used smokeless tobacco. She reports current alcohol use of about 28.0 standard drinks of alcohol per week. She reports that she does not use drugs.  Allergies:  Allergies  Allergen Reactions   Cocoa Anaphylaxis   Red Dye Hives    Rash and Nausea diarrhea   Lyrica [Pregabalin] Other (See Comments)    Abnormal bleeding   Other Other (See Comments)    Berries : Rash   Sulfa Antibiotics Rash    Medications reviewed.    ROS Full ROS performed and is  otherwise negative other than what is stated in HPI   BP 123/75    Pulse 64    Temp 98.1 F (36.7 C) (Oral)    Ht 5' 5.5" (1.664 m)    Wt 225 lb 9.6 oz (102.3 kg)    SpO2 97%    BMI 36.97 kg/m   Physical Exam  CONSTITUTIONAL: NAD, she is using  a walker but is able to stand up on her own and go to the stretcher slowly EYES: Pupils are equal, round,  Sclera are non-icteric. EARS, NOSE, MOUTH AND THROAT:She is wearing a mask, Hearing is intact to voice. LYMPH NODES:  Lymph nodes in the neck are normal. RESPIRATORY:  Lungs are clear. There is normal respiratory effort, with equal breath sounds bilaterally, and without pathologic use of accessory muscles. CARDIOVASCULAR: Heart is regular without murmurs, gallops, or rubs. GI: The abdomen is  soft,  pannus with reducible ventral hernia. No evidence of peritonitis GU: Rectal deferred.   MUSCULOSKELETAL: Normal muscle strength and tone.  There is pedal edema SKIN: Turgor is good and there are no pathologic skin lesions or ulcers. NEUROLOGIC: Motor and sensation is grossly normal. Cranial nerves are grossly intact. PSYCH:  Oriented to person, place and time. Affect is normal.    Assessment/Plan: 74 year old female with incisional hernia.  Discussed with patient in detail about her disease process.  Different approaches of robotic versus open repair.  I do think that given the fact that she had multiple abdominal operations a direct open approach might be in her best interest.  Procedure discussed with patient in detail.  Risks, benefits and possible occasions including but not limited to: Bleeding, infection, recurrence, bowel injuries, prolonged pain.  She understands and wishes to proceed.  Greater than 50% of the 45 minutes  visit was spent in counseling/coordination of care   Sterling Big, MD [facility] General Surgeon

## 2020-11-11 NOTE — Telephone Encounter (Signed)
Patient returns call, she is now aware of all dates regarding her surgery and voices understanding.  

## 2020-11-15 ENCOUNTER — Ambulatory Visit: Payer: Medicare PPO | Admitting: Physician Assistant

## 2020-11-15 ENCOUNTER — Ambulatory Visit (HOSPITAL_COMMUNITY)
Admission: RE | Admit: 2020-11-15 | Discharge: 2020-11-15 | Disposition: A | Discharge status: Home / Self Care | Payer: Medicare PPO | Source: Ambulatory Visit | Attending: Physician Assistant | Admitting: Physician Assistant

## 2020-11-15 ENCOUNTER — Other Ambulatory Visit: Payer: Self-pay

## 2020-11-15 VITALS — BP 130/67 | HR 71 | Temp 98.2°F | Resp 20 | Ht 65.5 in | Wt 230.0 lb

## 2020-11-15 DIAGNOSIS — R609 Edema, unspecified: Secondary | ICD-10-CM | POA: Diagnosis not present

## 2020-11-15 NOTE — Progress Notes (Signed)
[facility]           [facility] [place]  History and Physical   [name] is a 74 y.o. female who presents with chronic leg swelling.  She has a hx of chronic back issues and states that back in the summer, she had a spinal cord injury and she had began having increasing back pain.  She states that she became weak and was having trouble walking.  She had lack of sensation in her legs bilaterally.  She states that she had emergency surgery by Dr. Arnoldo Morale on 05/27/2020.  She is now walking with a walker as she has come a long way from her surgery this summer.  During her hospitalization, she was found to have a blood clot in the left leg.  She was on anticoagulation for 3 months.  She does have more swelling in the left leg than the right.  She did have some ulcers on the left leg in October that healed with una boot.  She does wear compression socks, but not consistently when she is at home.  She elevates her legs, but states probably not as much as she should.  She does use a wedge.  She used to sleep with the wedge and her legs elevated.  She currently has a small ulcer on the posterior aspect of the left lower leg.    She does have a hx of heart failure and is on lasix.  She states the lasix doesn't help her extremity edema much but when she has truncal edema, it works well.    She has hx of gastric bypass surgery.   Her mother had varicose veins.  Pt has been married to her 2nd husband for 39 years.  They recently renewed their vows in [place].  The pt is on a statin for cholesterol management.  The pt is not on a daily aspirin.   Other AC:  none The pt is on ACEI, BB for hypertension.   The pt is not diabetic.   Tobacco hx:  never   Past Medical History:  Diagnosis Date  . Alcohol abuse 04/25/2015   reportedly drinks ~ 1 bottle of wine / day  . Allergy   . Anastomotic ulcer S/P gastric bypass 06/18/2016  . Arthritis   . Cataract   . Chronic  diastolic heart failure ([place]) 10/25/2017  . Degenerative disc disease, cervical   . DVT, bilateral lower limbs ([place]) 05/2020   Age-indeterminate bilateral PERONEAL VEIN DVTs --> (was postop from acute cord compression, started on Xarelto Dosepak  . Essential hypertension 04/25/2015  . GAD (generalized anxiety disorder) 06/14/2016  . HNP (herniated nucleus pulposus) with myelopathy, cervical 05/27/2020   Cervical Myelopathy with Spinal cord compression (HCC)  --> s/p Anterior Cervical Discectomy Decompression)   . Hypothyroidism   . 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 UGIB (upper gastrointestinal bleed) 04/25/2015  . Venous stasis of both lower extremities 01/10/2019    Past Surgical History:  Procedure Laterality Date  . ANTERIOR CERVICAL DECOMP/DISCECTOMY FUSION Left 05/26/2020   Procedure: Cervical seven - Thoracic one ANTERIOR CERVICAL DISCECTOMY AND PLATING;  Surgeon: Newman Pies, MD;  Location: [place];  Service: Neurosurgery;  Laterality: Left;  . CHOLECYSTECTOMY    . COSMETIC SURGERY    . ESOPHAGOGASTRODUODENOSCOPY N/A 04/25/2015   Procedure: ESOPHAGOGASTRODUODENOSCOPY (EGD);  Surgeon: Jerene Bears, MD;  Location: [facility]  ENDOSCOPY;  Service: Endoscopy;  Laterality: N/A;  . FRACTURE SURGERY    . GASTRIC BYPASS  2003  . gastric ulcer repair  04/25/2015  . NM MYOVIEW LTD  10/10/2017   Normal LV size and function EF 72%.  Medium sized mild severity defect in the apical septal apical lateral and apical wall suggestive of breast attenuation.  LOW RISK.  No ischemia or infarction.    . OPEN REDUCTION INTERNAL FIXATION (ORIF) DISTAL RADIAL FRACTURE Right 01/15/2013   Procedure: OPEN REDUCTION INTERNAL FIXATION (ORIF) Right DISTAL RADIUS FRACTURE;  Surgeon: Marybelle Killings, MD;  Location: [place];  Service: Orthopedics;  Laterality: Right;  . SPINE SURGERY    . TRANSTHORACIC ECHOCARDIOGRAM  10/04/2017   Mild LVH.  EF 60-65%.  Grade 2/moderate  diastolic dysfunction.  Mild pulmonary hypertension.  No valve lesions    Social History   Socioeconomic History  . Marital status: Married    Spouse name: Not on file  . Number of children: Not on file  . Years of education: Not on file  . Highest education level: Not on file  Occupational History  . Occupation: Therapist, sports    Comment: ICU Tree surgeon  Tobacco Use  . Smoking status: Never Smoker  . Smokeless tobacco: Never Used  Vaping Use  . Vaping Use: Never used  Substance and Sexual Activity  . Alcohol use: Yes    Alcohol/week: 28.0 standard drinks    Types: 28 Glasses of wine per week    Comment: drinks daily and has a bottle and half of wine per day  . Drug use: No  . Sexual activity: Yes    Birth control/protection: Post-menopausal  Other Topics Concern  . 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 Heart disease Mother   . Stroke Mother 61  . Cancer Mother        skin and breast  . Hypertension Mother   . Breast cancer Mother 56  . COPD Father   . Cancer Father   . Diabetes Father   . Hypertension Father   . Stroke Maternal Grandmother   . Mental illness Maternal Grandmother   . Breast cancer Sister   . Cancer Cousin 40    Current Outpatient Medications  Medication Sig Dispense Refill  . acetaminophen (TYLENOL) 325 MG tablet Take 2 tablets (650 mg total) by mouth every 4 (four) hours as needed for mild pain ((score 1 to 3) or temp > 100.5).    Marland Kitchen Alpha-Lipoic Acid 600 MG CAPS Take 1  capsule (600 mg total) by mouth daily. 30 capsule 11  . benazepril-hydrochlorthiazide (LOTENSIN HCT) 20-25 MG tablet Take 1 tablet by mouth daily. 90 tablet 1  . carvedilol (COREG) 6.25 MG tablet Take 1 tablet (6.25 mg total) by mouth 2 (two) times daily with a meal. 180 tablet 3  . Cholecalciferol (VITAMIN D3) 125 MCG (5000 UT)  TABS Take 3 tablets (15,000 Units total) by mouth See admin instructions. Takes 15000 units 5 days a week and 20000 units on 2 days. 30 tablet 0  . Cyanocobalamin (VITAMIN B 12) 100 MCG LOZG Take 2,500 mg by mouth once a week. 30 lozenge 0  . cyclobenzaprine (FLEXERIL) 10 MG tablet Take 1 tablet (10 mg total) by mouth daily as needed for muscle spasms. . 90 tablet 0  . diphenhydrAMINE HCl (BENADRYL ALLERGY PO)     . ferrous sulfate 325 (65 FE) MG tablet Take 1 tablet (325 mg total) by mouth daily with breakfast. 30 tablet 3  . furosemide (LASIX) 20 MG tablet Pt take 40 mg Tuesday and Saturday 20 mg all other days 90 tablet 3  . gabapentin (NEURONTIN) 300 MG capsule TAKE 4 CAPSULES BY MOUTH EVERY MORNING, 3 CAPSULES IN THE AFTERNOON, AND 4 CAPSULES AT NIGHT 990 capsule 0  . levothyroxine (SYNTHROID) 125 MCG tablet Only take 1 tab Tues and Fridays (Patient taking differently: Take 125 mcg by mouth See admin instructions. Take 1 tablet by mouth on Tuesday and Fridays) 25 tablet 0  . levothyroxine (SYNTHROID) 150 MCG tablet everyday except Tues and Friday- take 135mg those days (Patient taking differently: Take 150 mcg by mouth See admin instructions. Takes everyday except Tues and Friday) 60 tablet 0  . meloxicam (MOBIC) 15 MG tablet TAKE 1 TABLET BY MOUTH DAILY. 90 tablet 1  . Mupirocin 2 % KIT Apply topically.    . pantoprazole (PROTONIX) 40 MG tablet TAKE 1 TABLET BY MOUTH DAILY. 90 tablet 0  . rosuvastatin (CRESTOR) 5 MG tablet Take 1 tablet (5 mg total) by mouth daily. 90 tablet 0  . traMADol (ULTRAM) 50 MG tablet TAKE 1 TABLET BY MOUTH 2 TIMES DAILY. MAX 6 TABLETS PER DAY  AS DIRECTED BY PRESCRIBER 60 tablet 0   No current facility-administered medications for this visit.    Allergies  Allergen Reactions  . Cocoa Anaphylaxis  . Red Dye Hives    Rash and Nausea diarrhea  . Lyrica [Pregabalin] Other (See Comments)    Abnormal bleeding  . Other Other (See Comments)    Berries : Rash  . Sulfa Antibiotics Rash    REVIEW OF SYSTEMS:   _0  denotes positive finding, _1  denotes negative finding Cardiac  Comments:  Chest pain or chest pressure:    Shortness of breath upon exertion: x   Short of breath when lying flat:    Irregular heart rhythm:        Vascular    Pain in calf, thigh, or hip brought on by ambulation:    Pain in feet at night that wakes you up from your sleep:     Blood clot in your veins: x   Leg swelling:  x       Pulmonary    Oxygen at home:    Productive cough:     Wheezing:         Neurologic    Sudden weakness in arms or legs:  x See HPI  Sudden numbness in arms or legs:  x See HPI  Sudden onset of difficulty speaking or slurred speech:    Temporary loss of vision in one eye:     Problems with dizziness:         Gastrointestinal    Blood in stool:     Vomited blood:         Genitourinary    Burning when urinating:  Blood in urine:        Psychiatric    Major depression:         Hematologic    Bleeding problems:    Problems with blood clotting too easily:        Skin    Rashes or ulcers:        Constitutional    Fever or chills:      PHYSICAL EXAMINATION:  Today's Vitals   11/15/20 1527  BP: 130/67  Pulse: 71  Resp: 20  Temp: 98.2 F (36.8 C)  TempSrc: Temporal  SpO2: 97%  Weight: 230 lb (104.3 kg)  Height: 5' 5.5" (1.664 m)  PainSc: 2   PainLoc: Leg   Body mass index is 37.69 kg/m.   General:  WDWN in NAD; vital signs documented above Gait: Not observed HENT: WNL, normocephalic Pulmonary: normal non-labored breathing without wheezing Cardiac: regular HR; without carotid  bruits Skin: without rashes Vascular Exam/Pulses:  Right Left  Radial 2+ (normal) 2+ (normal)  DP Brisk biphasic doppler signal Faintly palpable Brisk biphasic doppler signal  PT Brisk biphasic doppler signal Brisk biphasic doppler signal   Extremities: without ischemic changes, without cellulitis; without open wounds    Musculoskeletal: no muscle wasting or atrophy  Neurologic: A&O X 3;  moving all extremities equally Psychiatric:  The pt has Normal affect.   Non-Invasive Vascular Imaging:   Venous duplex on 11/15/2020: Venous Reflux Times  +--------------+---------+------+-----------+------------+--------+  RIGHT     Reflux NoRefluxReflux TimeDiameter cmsComments               Yes                   +--------------+---------+------+-----------+------------+--------+  CFV      no                         +--------------+---------+------+-----------+------------+--------+  FV mid    no                         +--------------+---------+------+-----------+------------+--------+  Popliteal   no                         +--------------+---------+------+-----------+------------+--------+  GSV at [facility]  no               0.71        +--------------+---------+------+-----------+------------+--------+  GSV prox thighno               0.44        +--------------+---------+------+-----------+------------+--------+  GSV mid thigh no               0.29        +--------------+---------+------+-----------+------------+--------+  GSV dist thighno               0.28        +--------------+---------+------+-----------+------------+--------+  GSV at knee  no               0.34         +--------------+---------+------+-----------+------------+--------+  GSV prox calf no               0.31        +--------------+---------+------+-----------+------------+--------+  SSV Pop Fossa no               0.27        +--------------+---------+------+-----------+------------+--------+  SSV prox calf no  0.23        +--------------+---------+------+-----------+------------+--------+  SSV mid calf no               0.28        +--------------+---------+------+-----------+------------+--------+     +--------------+---------+------+-----------+------------+--------+  LEFT     Reflux NoRefluxReflux TimeDiameter cmsComments               Yes                   +--------------+---------+------+-----------+------------+--------+  CFV      no                         +--------------+---------+------+-----------+------------+--------+  FV mid    no                         +--------------+---------+------+-----------+------------+--------+  Popliteal   no                         +--------------+---------+------+-----------+------------+--------+  GSV at [facility]  no               0.83        +--------------+---------+------+-----------+------------+--------+  GSV prox thighno               0.41        +--------------+---------+------+-----------+------------+--------+  GSV mid thigh no               0.38        +--------------+---------+------+-----------+------------+--------+  GSV dist thighno               0.37        +--------------+---------+------+-----------+------------+--------+  GSV at knee  no                0.42        +--------------+---------+------+-----------+------------+--------+  GSV prox calf no               0.31        +--------------+---------+------+-----------+------------+--------+  SSV Pop Fossa no               0.26        +--------------+---------+------+-----------+------------+--------+  SSV prox calf no               0.35        +--------------+---------+------+-----------+------------+--------+  SSV mid calf no               0.21        +--------------+---------+------+-----------+------------+--------+   Summary:  Right:  - No evidence of deep vein thrombosis seen in the right lower extremity,  from the common femoral through the popliteal veins.  - No evidence of superficial venous thrombosis in the right lower  extremity.  - There is no evidence of venous reflux seen in the right lower extremity.    Left:  - No evidence of deep vein thrombosis seen in the left lower extremity,  from the common femoral through the popliteal veins.  - No evidence of superficial venous thrombosis in the left lower  extremity.  - There is no evidence of venous reflux seen in the left lower extremity   [name] is a 74 y.o. female who presents with: bilateral leg swelling with left > right and hx of DVT earlier this year in the left leg and history of heart failure.    Venous duplex today reveals no evidence of DVT or venous reflux bilaterally. -she does  a small ulcer on the posterior portion of the left lower leg.  She has not been wearing her compression consistently at home.  I discussed with her to put her compression socks on prior to getting out of bed before her feet hit the floor.  Also discussed elevating legs with her wedge.  Her wound should heal with compression and elevation.   -She may also benefit from ABI if she continues to have non healing  wounds, however, she does have brisk biphasic doppler signals bilateral DP/PT and the right DP is faintly palpable.  If this wound does not heal, feel she could use una boot.   -discussed weight loss as well -she will follow up as needed    Leontine Locket, [facility] 11/15/2020 3:47 PM  Clinic MD:  Trula Slade

## 2020-11-16 ENCOUNTER — Ambulatory Visit: Payer: Medicare PPO | Admitting: Physician Assistant

## 2020-11-16 ENCOUNTER — Encounter: Payer: Self-pay | Admitting: Physician Assistant

## 2020-11-16 VITALS — BP 134/72 | HR 71 | Ht 65.5 in | Wt 232.9 lb

## 2020-11-16 DIAGNOSIS — E782 Mixed hyperlipidemia: Secondary | ICD-10-CM

## 2020-11-16 DIAGNOSIS — R600 Localized edema: Secondary | ICD-10-CM

## 2020-11-16 DIAGNOSIS — E039 Hypothyroidism, unspecified: Secondary | ICD-10-CM

## 2020-11-16 DIAGNOSIS — F1010 Alcohol abuse, uncomplicated: Secondary | ICD-10-CM | POA: Diagnosis not present

## 2020-11-16 DIAGNOSIS — Z23 Encounter for immunization: Secondary | ICD-10-CM

## 2020-11-16 DIAGNOSIS — R609 Edema, unspecified: Secondary | ICD-10-CM | POA: Diagnosis not present

## 2020-11-16 DIAGNOSIS — F10982 Alcohol use, unspecified with alcohol-induced sleep disorder: Secondary | ICD-10-CM

## 2020-11-16 DIAGNOSIS — I10 Essential (primary) hypertension: Secondary | ICD-10-CM

## 2020-11-16 MED ORDER — TRAZODONE HCL 50 MG PO TABS: 25.0000 mg | ORAL_TABLET | Freq: Every evening | ORAL | 3 refills | Status: DC | PRN

## 2020-11-16 NOTE — Patient Instructions (Signed)

## 2020-11-16 NOTE — Assessment & Plan Note (Signed)
-  Discussed with patient management options including medications and possible side effects, and agreeable to trial Trazodone. Advised to let me know if unable to tolerate medication. -Recommend to try establish a good sleep hygiene.  -Will continue to monitor.

## 2020-11-16 NOTE — Assessment & Plan Note (Addendum)
-  Last lipid panel within normal limits with the exception of elevated triglycerides at 174. -At last visit discussed with patient decreasing Crestor dose to see if gen. weakness improves, so will repeat cholesterol panel today.  If LDL continues to be at goal will consider decreasing Crestor to 2.5 mg.  Recommend to continue to work on reducing alcohol use and monitor simple carbohydrates.  Follow a heart healthy diet.

## 2020-11-16 NOTE — Assessment & Plan Note (Signed)
-  Last TSH 1.380, wnl -Continue current medication regimen. -Will repeat TSH today.

## 2020-11-16 NOTE — Progress Notes (Signed)
Established Patient Office Visit  Subjective:  Patient ID: [name], female    DOB: 1946-11-06  Age: 74 y.o. MRN: [id number]  CC:  Chief Complaint  Patient presents with  . Hypertension  . Thyroid Problem    HPI [facility] presents for follow-up on hypertension, hypothyroid and alcohol use.  HTN: Pt denies chest pain, palpitations, or dizziness. Lower extremity swelling is stable.  Patient was evaluated by Vascular yesterday and recommended to use compression socks, which will help with healing of small ulcer on posterior left lower leg. Taking medication as directed without side effects. Checks BP at home, but as frequent recently. Average readings have been 120s/70s. Pt tries to drink water but not as much as she should.  Hypothyroid: Asymptomatic. Reports knows what symptoms to watch for. Reports medication compliance.  Alcohol abuse: States alcohol use is about the same. Patient usually drinks from  3-5 am. Feels like improving sleep pattern will likely help improve and reduce alcohol use because she does not drink during the day. Has been a little more stressed with upcoming procedures such as hernia repair and dental work.  Insomnia: Reports takes Melatonin 10 mg and sometimes will also take benadryl to help with sleep. Reports her circadian rhythm is off, is used to being up during the night. In the past worked third shift. Has tried [place] in the past and did not tolerate well.   Past Medical History:  Diagnosis Date  . Alcohol abuse 04/25/2015   reportedly drinks ~ 1 bottle of wine / day  . Allergy   . Anastomotic ulcer S/P gastric bypass 06/18/2016  . Arthritis   . Cataract   . Chronic diastolic heart failure ([place]) 10/25/2017  . Degenerative disc disease, cervical   . DVT, bilateral lower limbs ([place]) 05/2020   Age-indeterminate bilateral PERONEAL VEIN DVTs --> (was postop from acute cord compression, started on Xarelto Dosepak  . Essential  hypertension 04/25/2015  . GAD (generalized anxiety disorder) 06/14/2016  . HNP (herniated nucleus pulposus) with myelopathy, cervical 05/27/2020   Cervical Myelopathy with Spinal cord compression (HCC)  --> s/p Anterior Cervical Discectomy Decompression)   . Hypothyroidism   . 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 UGIB (upper gastrointestinal bleed) 04/25/2015  . Venous stasis of both lower extremities 01/10/2019    Past Surgical History:  Procedure Laterality Date  . ANTERIOR CERVICAL DECOMP/DISCECTOMY FUSION Left 05/26/2020   Procedure: Cervical seven - Thoracic one ANTERIOR CERVICAL DISCECTOMY AND PLATING;  Surgeon: Newman Pies, MD;  Location: [place];  Service: Neurosurgery;  Laterality: Left;  . CHOLECYSTECTOMY    . COSMETIC SURGERY    . ESOPHAGOGASTRODUODENOSCOPY N/A 04/25/2015   Procedure: ESOPHAGOGASTRODUODENOSCOPY (EGD);  Surgeon: Jerene Bears, MD;  Location[facility] ENDOSCOPY;  Service: Endoscopy;  Laterality: N/A;  . FRACTURE SURGERY    . GASTRIC BYPASS  2003  . gastric ulcer repair  04/25/2015  . NM MYOVIEW LTD  10/10/2017   Normal LV size and function EF 72%.  Medium sized mild severity defect in the apical septal apical lateral and apical wall suggestive of breast attenuation.  LOW RISK.  No ischemia or infarction.    . OPEN REDUCTION INTERNAL FIXATION (ORIF) DISTAL RADIAL FRACTURE Right 01/15/2013   Procedure: OPEN REDUCTION INTERNAL FIXATION (ORIF) Right DISTAL RADIUS FRACTURE;  Surgeon: Marybelle Killings, MD;  Location: [place];  Service: Orthopedics;  Laterality: Right;  . SPINE SURGERY    .  TRANSTHORACIC ECHOCARDIOGRAM  10/04/2017   Mild LVH.  EF 60-65%.  Grade 2/moderate diastolic dysfunction.  Mild pulmonary hypertension.  No valve lesions    Family History  Problem Relation Age of Onset  . Heart disease Mother   . Stroke Mother 55  . Cancer Mother        skin and breast  . Hypertension Mother   . Breast cancer Mother 63   . COPD Father   . Cancer Father   . Diabetes Father   . Hypertension Father   . Stroke Maternal Grandmother   . Mental illness Maternal Grandmother   . Breast cancer Sister   . Cancer Cousin 55    Social History   Socioeconomic History  . Marital status: Married    Spouse name: Not on file  . Number of children: Not on file  . Years of education: Not on file  . Highest education level: Not on file  Occupational History  . Occupation: Charity fundraiser    Comment: ICU Sales executive  Tobacco Use  . Smoking status: Never Smoker  . Smokeless tobacco: Never Used  Vaping Use  . Vaping Use: Never used  Substance and Sexual Activity  . Alcohol use: Yes    Alcohol/week: 28.0 standard drinks    Types: 28 Glasses of wine per week    Comment: drinks daily and has a bottle and half of wine per day  . Drug use: No  . Sexual activity: Yes    Birth control/protection: Post-menopausal  Other Topics Concern  . 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 acetaminophen (TYLENOL) 325 MG tablet Take 2 tablets (650 mg total) by mouth every 4 (four) hours as needed for mild pain ((score 1 to 3) or temp > 100.5).    Marland Kitchen Alpha-Lipoic Acid 600 MG CAPS Take 1 capsule (600 mg total) by mouth daily. 30 capsule 11  . benazepril-hydrochlorthiazide (LOTENSIN HCT) 20-25 MG tablet Take 1 tablet by mouth daily. 90 tablet 1  . carvedilol (COREG) 6.25 MG tablet Take 1 tablet (6.25 mg total) by mouth 2 (two) times daily with a  meal. 180 tablet 3  . Cholecalciferol (VITAMIN D3) 125 MCG (5000 UT) TABS Take 3 tablets (15,000 Units total) by mouth See admin instructions. Takes 00379 units 5 days a week and 20000 units on 2 days. 30 tablet 0  . Cyanocobalamin (VITAMIN B 12) 100 MCG LOZG Take 2,500 mg by mouth once a week. 30 lozenge 0  . cyclobenzaprine (FLEXERIL) 10 MG tablet Take 1 tablet (10 mg total) by mouth daily as needed for muscle spasms. . 90 tablet 0  . diphenhydrAMINE HCl (BENADRYL ALLERGY PO)     . ferrous sulfate 325 (65 FE) MG tablet Take 1 tablet (325 mg total) by mouth daily with breakfast. 30 tablet 3  . furosemide (LASIX) 20 MG tablet Pt take 40 mg Tuesday  and Saturday 20 mg all other days 90 tablet 3  . gabapentin (NEURONTIN) 300 MG capsule TAKE 4 CAPSULES BY MOUTH EVERY MORNING, 3 CAPSULES IN THE AFTERNOON, AND 4 CAPSULES AT NIGHT 990 capsule 0  . levothyroxine (SYNTHROID) 125 MCG tablet Only take 1 tab Tues and Fridays (Patient taking differently: Take 125 mcg by mouth See admin instructions. Take 1 tablet by mouth on Tuesday and Fridays) 25 tablet 0  . levothyroxine (SYNTHROID) 150 MCG tablet everyday except Tues and Friday- take 188mcg those days (Patient taking differently: Take 150 mcg by mouth See admin instructions. Takes everyday except Tues and Friday) 60 tablet 0  . meloxicam (MOBIC) 15 MG tablet TAKE 1 TABLET BY MOUTH DAILY. 90 tablet 1  . Mupirocin 2 % KIT Apply topically.    . pantoprazole (PROTONIX) 40 MG tablet TAKE 1 TABLET BY MOUTH DAILY. 90 tablet 0  . rosuvastatin (CRESTOR) 5 MG tablet Take 1 tablet (5 mg total) by mouth daily. 90 tablet 0  . traMADol (ULTRAM) 50 MG tablet TAKE 1 TABLET BY MOUTH 2 TIMES DAILY. MAX 6 TABLETS PER DAY AS DIRECTED BY PRESCRIBER 60 tablet 0   No facility-administered medications prior to visit.    Allergies  Allergen Reactions  . Cocoa Anaphylaxis  . Red Dye Hives    Rash and Nausea diarrhea  . Lyrica [Pregabalin] Other (See Comments)    Abnormal  bleeding  . Other Other (See Comments)    Berries : Rash  . Sulfa Antibiotics Rash    ROS Review of Systems A fourteen system review of systems was performed and found to be positive as per HPI.   Objective:    Physical Exam General:  Well Developed, well nourished, appropriate for stated age.  Neuro:  Alert and oriented,  extra-ocular muscles intact  HEENT:  Normocephalic, atraumatic, neck supple Skin:  no gross rash. Small superficial skin ulcer on posterior left leg. Cardiac:  RRR, S1 S2 Respiratory:  ECTA B/L w/o wheezing, Not using accessory muscles, speaking in full sentences- unlabored. Vascular:  Ext. Warm with chronic venous stasis  Psych:  No HI/SI, judgement and insight good, Euthymic mood. Full Affect.   BP 134/72   Pulse 71   Ht 5' 5.5" (1.664 m)   Wt 232 lb 14.4 oz (105.6 kg)   SpO2 99%   BMI 38.17 kg/m  Wt Readings from Last 3 Encounters:  11/16/20 232 lb 14.4 oz (105.6 kg)  11/15/20 230 lb (104.3 kg)  11/10/20 225 lb 9.6 oz (102.3 kg)     Health Maintenance Due  Topic Date Due  . Hepatitis C Screening  Never done  . PNA vac Low Risk Adult (1 of 2 - PCV13) Never done  . COVID-19 Vaccine (2 - Pfizer 3-dose booster series) 01/31/2020    There are no preventive care reminders to display for this patient.  Lab Results  Component Value Date   TSH 1.380 02/16/2020   Lab Results  Component Value Date   WBC 6.2 10/15/2020   HGB 13.5 10/15/2020   HCT 38.9 10/15/2020   MCV 103 (H) 10/15/2020   PLT 234 10/15/2020   Lab Results  Component Value Date   NA 136 10/15/2020   K 4.3 10/15/2020   CO2 28 10/15/2020   GLUCOSE 99 10/15/2020   BUN 23 10/15/2020   CREATININE 1.04 (H) 10/15/2020   BILITOT 0.6 10/15/2020   ALKPHOS 104 10/15/2020   AST 21 10/15/2020   ALT 14 10/15/2020   PROT 6.6  10/15/2020   ALBUMIN 4.3 10/15/2020   CALCIUM 9.1 10/15/2020   ANIONGAP 9 05/31/2020   Lab Results  Component Value Date   CHOL 131 02/16/2020   Lab Results   Component Value Date   HDL 67 02/16/2020   Lab Results  Component Value Date   LDLCALC 36 02/16/2020   Lab Results  Component Value Date   TRIG 174 (H) 02/16/2020   Lab Results  Component Value Date   CHOLHDL 2.0 02/16/2020   Lab Results  Component Value Date   HGBA1C 5.3 05/01/2019      Assessment & Plan:   Problem List Items Addressed This Visit      Cardiovascular and Mediastinum   Essential hypertension - Primary (Chronic)    -Controlled. -Continue current medication regimen. -Continue to follow up with Cardiology. -Recommend to increase hydration and follow a low sodium diet. -Will continue to monitor. Will collect CMP for medication monitoring.      Relevant Orders   Lipid panel   Comprehensive metabolic panel   TSH     Endocrine   Hypothyroidism (Chronic)    -Last TSH 1.380, wnl -Continue current medication regimen. -Will repeat TSH today.      Relevant Orders   Lipid panel   Comprehensive metabolic panel   TSH     Nervous and Auditory   Insomnia due to alcohol excess and stress (Chronic)    -Discussed with patient management options including medications and possible side effects, and agreeable to trial Trazodone. Advised to let me know if unable to tolerate medication. -Recommend to try establish a good sleep hygiene.  -Will continue to monitor.       Relevant Medications   traZODone (DESYREL) 50 MG tablet   Other Relevant Orders   Lipid panel   Comprehensive metabolic panel   TSH     Other   Alcohol abuse-with elevated ALT and AST (Chronic)    -Encourage to continue to reduce alcohol and improving insomnia will likely help decrease alcohol use at night. -Last liver function: AST 21, ALT 14      Peripheral edema- b/l L Ext w chronic skin changes (Chronic)    -Recommend to resume compression socks, elevation and follow a low sodium diet. If ulcer fails to improve or worsens then recommend to follow up with Vascular for unna boot.   -Will continue to monitor.      HLD (hyperlipidemia) (Chronic)    -Last lipid panel within normal limits with the exception of elevated triglycerides at 174. -At last visit discussed with patient decreasing Crestor dose to see if gen. weakness improves, so will repeat cholesterol panel today.  If LDL continues to be at goal will consider decreasing Crestor to 2.5 mg.  Recommend to continue to work on reducing alcohol use and monitor simple carbohydrates.  Follow a heart healthy diet.      Relevant Orders   Lipid panel   Comprehensive metabolic panel   TSH    Other Visit Diagnoses    Need for influenza vaccination       Relevant Orders   Flu Vaccine QUAD High Dose(Fluad) (Completed)      Meds ordered this encounter  Medications  . traZODone (DESYREL) 50 MG tablet    Sig: Take 0.5-1 tablets (25-50 mg total) by mouth at bedtime as needed for sleep.    Dispense:  30 tablet    Refill:  3    Order Specific Question:   Supervising Provider    Answer:  METHENEY, CATHERINE D [[id number]]    Follow-up: Return in about 4 months (around 03/17/2021) for HTN, insomnia.   Note:  This note was prepared with assistance of Dragon voice recognition software. Occasional wrong-word or sound-a-like substitutions may have occurred due to the inherent limitations of voice recognition software.  Lorrene Reid, PA-C

## 2020-11-16 NOTE — Assessment & Plan Note (Signed)
-  Controlled. -Continue current medication regimen. -Continue to follow up with Cardiology. -Recommend to increase hydration and follow a low sodium diet. -Will continue to monitor. Will collect CMP for medication monitoring.

## 2020-11-16 NOTE — Assessment & Plan Note (Addendum)
-  Recommend to resume compression socks, elevation and follow a low sodium diet. If ulcer fails to improve or worsens then recommend to follow up with Vascular for unna boot.  -Will continue to monitor.

## 2020-11-16 NOTE — Assessment & Plan Note (Signed)
-  Encourage to continue to reduce alcohol and improving insomnia will likely help decrease alcohol use at night. -Last liver function: AST 21, ALT 14

## 2020-11-17 ENCOUNTER — Other Ambulatory Visit: Payer: Self-pay | Admitting: Family Medicine

## 2020-11-17 ENCOUNTER — Ambulatory Visit: Payer: Medicare PPO | Admitting: Physical Medicine and Rehabilitation

## 2020-11-17 ENCOUNTER — Telehealth: Payer: Self-pay

## 2020-11-17 DIAGNOSIS — E039 Hypothyroidism, unspecified: Secondary | ICD-10-CM

## 2020-11-17 LAB — COMPREHENSIVE METABOLIC PANEL
ALT: 16 IU/L (ref 0–32)
AST: 17 IU/L (ref 0–40)
Albumin/Globulin Ratio: 1.9 (ref 1.2–2.2)
Albumin: 4 g/dL (ref 3.7–4.7)
Alkaline Phosphatase: 107 IU/L (ref 44–121)
BUN/Creatinine Ratio: 18 (ref 12–28)
BUN: 22 mg/dL (ref 8–27)
Bilirubin Total: 0.5 mg/dL (ref 0.0–1.2)
CO2: 26 mmol/L (ref 20–29)
Calcium: 9.4 mg/dL (ref 8.7–10.3)
Chloride: 92 mmol/L — ABNORMAL LOW (ref 96–106)
Creatinine, Ser: 1.25 mg/dL — ABNORMAL HIGH (ref 0.57–1.00)
GFR calc Af Amer: 49 mL/min/{1.73_m2} — ABNORMAL LOW (ref >59)
GFR calc non Af Amer: 42 mL/min/{1.73_m2} — ABNORMAL LOW (ref >59)
Globulin, Total: 2.1 g/dL (ref 1.5–4.5)
Glucose: 86 mg/dL (ref 65–99)
Potassium: 4.8 mmol/L (ref 3.5–5.2)
Sodium: 130 mmol/L — ABNORMAL LOW (ref 134–144)
Total Protein: 6.1 g/dL (ref 6.0–8.5)

## 2020-11-17 LAB — LIPID PANEL
Chol/HDL Ratio: 2.4 ratio (ref 0.0–4.4)
Cholesterol, Total: 146 mg/dL (ref 100–199)
HDL: 62 mg/dL (ref >39)
LDL Chol Calc (NIH): 52 mg/dL (ref 0–99)
Triglycerides: 198 mg/dL — ABNORMAL HIGH (ref 0–149)
VLDL Cholesterol Cal: 32 mg/dL (ref 5–40)

## 2020-11-17 LAB — TSH: TSH: 0.283 u[IU]/mL — ABNORMAL LOW (ref 0.450–4.500)

## 2020-11-17 NOTE — Telephone Encounter (Signed)
Error: Cardia Clearance faxed to Dr. Bryan Lemma

## 2020-11-17 NOTE — Telephone Encounter (Signed)
Faxed cardiac clearance to Dr. Marcille Blanco at [facility] at [phone].

## 2020-11-18 ENCOUNTER — Telehealth: Payer: Self-pay

## 2020-11-18 NOTE — Telephone Encounter (Signed)
   [facility] Pre-operative Risk Assessment    Request for surgical clearance:  1. What type of surgery is being performed? HERNIA REPAIR  VENTRAL OPEN  2. When is this surgery scheduled? 12-02-2020   3. What type of clearance is required (medical clearance vs. Pharmacy clearance to hold med vs. Both)? MEDICAL  4. Are there any medications that need to be held prior to surgery and how long? NONE   5. Practice name and name of physician performing surgery? [facility]   6. What is the office phone number? [phone]   7.   What is the office fax number? [phone]  8.   Anesthesia type (None, local, MAC, general) ? GENERAL

## 2020-11-18 NOTE — Telephone Encounter (Signed)
Left voice message to call back 11/18/2020 at 8:23 AM

## 2020-11-22 ENCOUNTER — Telehealth: Payer: Self-pay

## 2020-11-22 ENCOUNTER — Other Ambulatory Visit: Payer: Self-pay | Admitting: Physician Assistant

## 2020-11-22 NOTE — Telephone Encounter (Signed)
   Primary Cardiologist: Bryan Lemma, MD  Chart reviewed as part of pre-operative protocol coverage. Patient was contacted 11/22/2020 in reference to pre-operative risk assessment for pending surgery as outlined below.  [name] was last seen on 07/28/20 by Dr. Herbie Baltimore. She has a history of HFpEF, venous insufficiency, HTN, HLD. Since that day, [name] has done well. She reports no chest pain, pressure, tightness, no shortness of breath nor dyspnea on exertion. Reports home weights are stable and only needing an extra dose of her Lasix very rarely.  Therefore, based on ACC/AHA guidelines, the patient would be at acceptable risk for the planned procedure without further cardiovascular testing.   The patient was advised that if she develops new symptoms prior to surgery to contact our office to arrange for a follow-up visit, and she verbalized understanding.  I will route this recommendation to the requesting party via Epic fax function and remove from pre-op pool. Please call with questions.  Alver Sorrow, NP 11/22/2020, 11:51 AM

## 2020-11-22 NOTE — Telephone Encounter (Signed)
Left VM requesting call back on home phone  Alver Sorrow, NP

## 2020-11-22 NOTE — Telephone Encounter (Signed)
[facility] Called in and stated they did not rec the clearance that was suppose to be faxed over this morning.  They would like to know if it can be refaxed?  I did verify the number is correct

## 2020-11-22 NOTE — Telephone Encounter (Signed)
Faxed again via [employer] and routed via Epic message to requesting CMA at GI office.  Alver Sorrow, NP

## 2020-11-22 NOTE — Telephone Encounter (Signed)
Called DR.Herbie Baltimore to check status of Cardiac Clearance-spoke with [facility] [place] will refax to [place].

## 2020-11-22 NOTE — Telephone Encounter (Signed)
Received cardiac clearance from Dr. Bryan Lemma and the patient would be acceptable risk for the planned procedure without further cardiovascular testing.

## 2020-11-23 ENCOUNTER — Telehealth: Payer: Self-pay

## 2020-11-23 NOTE — Telephone Encounter (Signed)
Cardiac Clearance received from Dr.harding [facility]- the patient would be at acceptable risk for the planned procedure without further cardiovascular testing. Please see full note in epic 11/18/20.

## 2020-11-25 ENCOUNTER — Encounter: Payer: Self-pay | Admitting: Surgery

## 2020-11-25 ENCOUNTER — Encounter
Admission: RE | Admit: 2020-11-25 | Discharge: 2020-11-25 | Disposition: A | Discharge status: Home / Self Care | Payer: Medicare PPO | Source: Ambulatory Visit | Attending: Surgery | Admitting: Surgery

## 2020-11-25 ENCOUNTER — Other Ambulatory Visit: Payer: Self-pay

## 2020-11-25 NOTE — Progress Notes (Signed)
[facility] Perioperative Services  Pre-Admission/Anesthesia Testing Clinical Review  Date: 11/25/20  Patient Demographics:  Name: [name] DOB:   05-28-1946 MRN:   [id number]  Planned Surgical Procedure(s):    Case: [id number] Date/Time: 12/02/20 1003   Procedure: HERNIA REPAIR VENTRAL ADULT, open (N/A )   Anesthesia type: General   Pre-op diagnosis: Ventral hernia   Location: [facility] OR ROOM 04 / [facility] ORS FOR ANESTHESIA GROUP   Surgeons: Jules Husbands, MD    NOTE: Available PAT nursing documentation and vital signs have been reviewed. Clinical nursing staff has updated patient's PMH/PSHx, current medication list, and drug allergies/intolerances to ensure comprehensive history available to assist in medical decision making as it pertains to the aforementioned surgical procedure and anticipated anesthetic course.   Clinical Discussion:  [name] is a 74 y.o. female who is submitted for pre-surgical anesthesia review and clearance prior to her undergoing the above procedure. Patient has never been a smoker. Pertinent PMH includes: HFpEF, aortic atherosclerosis HTN, DVT, hypothyroidism, OA, IDA anastomotic ulcer s/p RNY bypass, cervical DDD, spinal cord compression secondary to herniated nucleus pulposus with associated cervical myelopathy (s/p cervical discectomy/decompression) peripheral venous insufficiency, GAD, alcohol abuse (consumes a bottle of wine per day).   Patient is followed by cardiology Ellyn Hack, MD). She was last seen in the cardiology clinic on 07/28/2020; notes reviewed.  At the time of her clinic visit, patient doing well overall from a cardiovascular standpoint.  She denies any chest pain, however complains of chronic exertional dyspnea and peripheral edema related to her known HFpEF.  Patient on daily furosemide, which has improved both her breathing and her peripheral edema.  Patient wearing antiembolism stockings to help with  edema.  Patient denies PND, orthopnea, palpitations, vertiginous symptoms, and presyncope/syncope.  Patient working with physical therapy following her cervical discectomy/decompression done a few months ago.  Postoperatively, patient developed indeterminate aged bilateral peroneal DVTs and was started on rivaroxaban.  Most recent repeat imaging of her lower extremities performed on 11/15/2020 revealed no evidence of DVT.  TTE performed on 06 June 2017 revealed normal left ventricular systolic function with mild LVH; LVEF 60-65%.  Subsequent Lexiscan performed on 10/10/2017 revealed a medium defect of mild severity present in the apical septal, apical lateral and, apical location consistent  which was felt to be consistent with artifact/chest wall attenuation.  LVEF hyperdynamic at 72% (see full interpretation of cardiovascular test below).  Patient on GDMT for her hypertension.  Blood pressure 142/88 on currently prescribed ACEi, diuretic, and beta-blocker therapy.  Patient is on a statin for her HLD.  No changes were made to patient's medication regimen.  Patient to follow-up with outpatient cardiology in 6 months.  Patient is scheduled to undergo an elective ventral hernia repair on 12/02/2020 with Dr. Caroleen Hamman.  Given patient's past medical history significant for cardiovascular issues, presurgical cardiac clearance was sought by the performing surgeon's office and PAT team.  Per cardiology, "since patient's last visit with cardiology she reports no chest pain, pressure, tightness, no shortness of breath nor dyspnea on exertion.  Home weights are stable and only need to use extra dose of Lasix very rarely.  Therefore, based on ACC/AHA guidelines, the patient would be at an ACCEPTABLE risk for the planned procedure without further cardiovascular testing". This patient is no longer on daily anticoagulation or antiplatelet therapy.  She denies previous perioperative complications with anesthesia. She  underwent a general anesthetic course at [facility] in 04/2020  with no documented complications.  Of note, patient status post recent cervical spine surgery secondary to herniated nucleus pulposus.  Most recent imaging of the cervical spine included below for review by anesthesia team to assist with planning for endotracheal intubation.  Vitals with BMI 11/25/2020 11/16/2020 11/15/2020  Height 5' 5.6" 5' 5.5" 5' 5.5"  Weight 222 lbs 232 lbs 14 oz 230 lbs  BMI 36.28 65.78 46.96  Systolic - 295 284  Diastolic - 72 67  Pulse - 71 71    Providers/Specialists:   NOTE: Primary physician provider listed below. Patient may have been seen by APP or partner within same practice.   PROVIDER ROLE / SPECIALTY LAST Suszanne Finch, MD General Surgery  11/10/2020  Lorrene Reid, PA-C Primary Care Provider  11/16/2020  Glenetta Hew, MD Cardiology  07/28/2020   Allergies:  Cocoa, Red dye, Lyrica [pregabalin], Other, and Sulfa antibiotics  Current Home Medications:   No current facility-administered medications for this encounter.   Marland Kitchen acetaminophen (TYLENOL) 500 MG tablet  . Alpha-Lipoic Acid 600 MG CAPS  . benazepril-hydrochlorthiazide (LOTENSIN HCT) 20-25 MG tablet  . carvedilol (COREG) 6.25 MG tablet  . Cholecalciferol (VITAMIN D3) 125 MCG (5000 UT) TABS  . Cyanocobalamin (VITAMIN B-12) 2500 MCG SUBL  . cyclobenzaprine (FLEXERIL) 10 MG tablet  . diphenhydrAMINE (BENADRYL) 25 MG tablet  . ferrous sulfate 325 (65 FE) MG tablet  . furosemide (LASIX) 20 MG tablet  . gabapentin (NEURONTIN) 300 MG capsule  . levothyroxine (SYNTHROID) 125 MCG tablet  . levothyroxine (SYNTHROID) 150 MCG tablet  . meloxicam (MOBIC) 15 MG tablet  . Mupirocin 2 % KIT  . rosuvastatin (CRESTOR) 5 MG tablet  . traMADol (ULTRAM) 50 MG tablet  . traZODone (DESYREL) 50 MG tablet  . acetaminophen (TYLENOL) 325 MG tablet  . Cyanocobalamin (VITAMIN B 12) 100 MCG LOZG  . pantoprazole (PROTONIX)  40 MG tablet   History:   Past Medical History:  Diagnosis Date  . Alcohol abuse 04/25/2015   reportedly drinks ~ 1 bottle of wine / day  . Allergy   . Anastomotic ulcer S/P gastric bypass 06/18/2016  . Aortic atherosclerosis ([place])   . Arthritis   . Cataract   . Chronic diastolic heart failure ([place]) 10/25/2017  . Degenerative disc disease, cervical   . DVT, bilateral lower limbs ([place]) 05/2020   Age-indeterminate bilateral PERONEAL VEIN DVTs --> (was postop from acute cord compression, started on Xarelto Dosepak  . Essential hypertension 04/25/2015  . GAD (generalized anxiety disorder) 06/14/2016  . HNP (herniated nucleus pulposus) with myelopathy, cervical 05/27/2020   Cervical Myelopathy with Spinal cord compression (HCC)  --> s/p Anterior Cervical Discectomy Decompression)   . Hypothyroidism   . IDA (iron deficiency anemia)   . 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 UGIB (upper gastrointestinal bleed) 04/25/2015  . Venous stasis of both lower extremities 01/10/2019   Past Surgical History:  Procedure Laterality Date  . ABDOMINAL HYSTERECTOMY    . ANTERIOR CERVICAL DECOMP/DISCECTOMY FUSION Left 05/26/2020   Procedure: Cervical seven - Thoracic one ANTERIOR CERVICAL DISCECTOMY AND PLATING;  Surgeon: Newman Pies, MD;  Location: [facility];  Service: Neurosurgery;  Laterality: Left;  . CHOLECYSTECTOMY    . COSMETIC SURGERY     extra fat taken off arms and eyelid lifted  . ESOPHAGOGASTRODUODENOSCOPY N/A 04/25/2015   Procedure: ESOPHAGOGASTRODUODENOSCOPY (EGD);  Surgeon: Jerene Bears, MD;  Location[facility];  Service: Endoscopy;  Laterality: N/A;  .  FRACTURE SURGERY    . GASTRIC BYPASS  2003  . gastric ulcer repair  04/25/2015  . NM MYOVIEW LTD  10/10/2017   Normal LV size and function EF 72%.  Medium sized mild severity defect in the apical septal apical lateral and apical wall suggestive of breast attenuation.  LOW RISK.  No  ischemia or infarction.    . OPEN REDUCTION INTERNAL FIXATION (ORIF) DISTAL RADIAL FRACTURE Right 01/15/2013   Procedure: OPEN REDUCTION INTERNAL FIXATION (ORIF) Right DISTAL RADIUS FRACTURE;  Surgeon: Marybelle Killings, MD;  Location: [facility];  Service: Orthopedics;  Laterality: Right;  . SPINE SURGERY    . TRANSTHORACIC ECHOCARDIOGRAM  10/04/2017   Mild LVH.  EF 60-65%.  Grade 2/moderate diastolic dysfunction.  Mild pulmonary hypertension.  No valve lesions   Family History  Problem Relation Age of Onset  . Heart disease Mother   . Stroke Mother 22  . Cancer Mother        skin and breast  . Hypertension Mother   . Breast cancer Mother 30  . COPD Father   . Cancer Father   . Diabetes Father   . Hypertension Father   . Stroke Maternal Grandmother   . Mental illness Maternal Grandmother   . Breast cancer Sister   . Cancer Cousin 20   Social History   Tobacco Use  . Smoking status: Never Smoker  . Smokeless tobacco: Never Used  Vaping Use  . Vaping Use: Never used  Substance Use Topics  . Alcohol use: Yes    Alcohol/week: 28.0 standard drinks    Types: 28 Glasses of wine per week    Comment: drinks daily and has a bottle and half of wine per day  . Drug use: No    Pertinent Clinical Results:  LABS: Labs reviewed: Acceptable for surgery.   Office Visit on 11/16/2020  Component Date Value Ref Range Status  . Cholesterol, Total 11/16/2020 146  100 - 199 mg/dL Final  . Triglycerides 11/16/2020 198* 0 - 149 mg/dL Final  . HDL 11/16/2020 62  >39 mg/dL Final  . VLDL Cholesterol Cal 11/16/2020 32  5 - 40 mg/dL Final  . LDL Chol Calc (NIH) 11/16/2020 52  0 - 99 mg/dL Final  . Chol/HDL Ratio 11/16/2020 2.4  0.0 - 4.4 ratio Final   Comment:                                   T. Chol/HDL Ratio                                             Men  Women                               1/2 Avg.Risk  3.4    3.3                                   Avg.Risk  5.0    4.4                                 2X Avg.Risk  9.6  7.1                                3X Avg.Risk 23.4   11.0   . Glucose 11/16/2020 86  65 - 99 mg/dL Final  . BUN 11/16/2020 22  8 - 27 mg/dL Final  . Creatinine, Ser 11/16/2020 1.25* 0.57 - 1.00 mg/dL Final  . GFR calc non Af Amer 11/16/2020 42* >59 mL/min/1.73 Final  . GFR calc Af Amer 11/16/2020 49* >59 mL/min/1.73 Final   Comment: **In accordance with recommendations from the NKF-ASN Task force,**   [facility] is in the process of updating its eGFR calculation to the   2021 CKD-EPI creatinine equation that estimates kidney function   without a race variable.   . BUN/Creatinine Ratio 11/16/2020 18  12 - 28 Final  . Sodium 11/16/2020 130* 134 - 144 mmol/L Final  . Potassium 11/16/2020 4.8  3.5 - 5.2 mmol/L Final  . Chloride 11/16/2020 92* 96 - 106 mmol/L Final  . CO2 11/16/2020 26  20 - 29 mmol/L Final  . Calcium 11/16/2020 9.4  8.7 - 10.3 mg/dL Final  . Total Protein 11/16/2020 6.1  6.0 - 8.5 g/dL Final  . Albumin 11/16/2020 4.0  3.7 - 4.7 g/dL Final  . Globulin, Total 11/16/2020 2.1  1.5 - 4.5 g/dL Final  . Albumin/Globulin Ratio 11/16/2020 1.9  1.2 - 2.2 Final  . Bilirubin Total 11/16/2020 0.5  0.0 - 1.2 mg/dL Final  . Alkaline Phosphatase 11/16/2020 107  44 - 121 IU/L Final                 **Please note reference interval change**  . AST 11/16/2020 17  0 - 40 IU/L Final  . ALT 11/16/2020 16  0 - 32 IU/L Final  . TSH 11/16/2020 0.283* 0.450 - 4.500 uIU/mL Final  Lab on 10/15/2020  Component Date Value Ref Range Status  . WBC 10/15/2020 6.2  3.4 - 10.8 x10E3/uL Final  . RBC 10/15/2020 3.79  3.77 - 5.28 x10E6/uL Final  . Hemoglobin 10/15/2020 13.5  11.1 - 15.9 g/dL Final  . Hematocrit 10/15/2020 38.9  34.0 - 46.6 % Final  . MCV 10/15/2020 103* 79 - 97 fL Final  . MCH 10/15/2020 35.6* 26.6 - 33.0 pg Final  . MCHC 10/15/2020 34.7  31.5 - 35.7 g/dL Final  . RDW 10/15/2020 12.3  11.7 - 15.4 % Final  . Platelets 10/15/2020 234  150 - 450 x10E3/uL Final  .  Neutrophils 10/15/2020 47  Not Estab. % Final  . Lymphs 10/15/2020 39  Not Estab. % Final  . Monocytes 10/15/2020 8  Not Estab. % Final  . Eos 10/15/2020 4  Not Estab. % Final  . Basos 10/15/2020 2  Not Estab. % Final  . Neutrophils Absolute 10/15/2020 2.9  1.4 - 7.0 x10E3/uL Final  . Lymphocytes Absolute 10/15/2020 2.4  0.7 - 3.1 x10E3/uL Final  . Monocytes Absolute 10/15/2020 0.5  0.1 - 0.9 x10E3/uL Final  . EOS (ABSOLUTE) 10/15/2020 0.3  0.0 - 0.4 x10E3/uL Final  . Basophils Absolute 10/15/2020 0.1  0.0 - 0.2 x10E3/uL Final  . Immature Granulocytes 10/15/2020 0  Not Estab. % Final  . Immature Grans (Abs) 10/15/2020 0.0  0.0 - 0.1 x10E3/uL Final  . Glucose 10/15/2020 99  65 - 99 mg/dL Final  . BUN 10/15/2020 23  8 - 27 mg/dL Final  . Creatinine, Ser 10/15/2020 1.04* 0.57 - 1.00 mg/dL  Final  . GFR calc non Af Amer 10/15/2020 53* >59 mL/min/1.73 Final  . GFR calc Af Amer 10/15/2020 62  >59 mL/min/1.73 Final   Comment: **In accordance with recommendations from the NKF-ASN Task force,**   [facility] is in the process of updating its eGFR calculation to the   2021 CKD-EPI creatinine equation that estimates kidney function   without a race variable.   . BUN/Creatinine Ratio 10/15/2020 22  12 - 28 Final  . Sodium 10/15/2020 136  134 - 144 mmol/L Final  . Potassium 10/15/2020 4.3  3.5 - 5.2 mmol/L Final  . Chloride 10/15/2020 95* 96 - 106 mmol/L Final  . CO2 10/15/2020 28  20 - 29 mmol/L Final  . Calcium 10/15/2020 9.1  8.7 - 10.3 mg/dL Final  . Total Protein 10/15/2020 6.6  6.0 - 8.5 g/dL Final  . Albumin 10/15/2020 4.3  3.7 - 4.7 g/dL Final  . Globulin, Total 10/15/2020 2.3  1.5 - 4.5 g/dL Final  . Albumin/Globulin Ratio 10/15/2020 1.9  1.2 - 2.2 Final  . Bilirubin Total 10/15/2020 0.6  0.0 - 1.2 mg/dL Final  . Alkaline Phosphatase 10/15/2020 104  44 - 121 IU/L Final                 **Please note reference interval change**  . AST 10/15/2020 21  0 - 40 IU/L Final  . ALT 10/15/2020 14  0  - 32 IU/L Final     ECG: Date: 05/24/2020 Time ECG obtained: 1548 PM Rate: 72 bpm Rhythm: normal sinus Axis (leads I and aVF): Normal Intervals: PR 190 ms. QRS 84 ms. QTc 444 ms. ST segment and T wave changes: No evidence of acute ST segment elevation or depression Comparison: Similar to previous tracing obtained on 01/13/2020   IMAGING / PROCEDURES: VASCULAR ULTRASOUND LOWER EXTREMITY VENOUS REFLUX STUDY performed on 11/15/2020 1. Right:  No evidence of DVT seen in the right lower extremity from the common femoral through the popliteal veins  No evidence of superficial venous thrombus in the right lower extremity  No evidence of venous reflux seen in the right lower extremity 2. Left:  No evidence of DVT seen in the left lower extremity from the common femoral vein to the popliteal veins  No evidence of superficial venous thrombosis in the left lower extremity  No evidence of venous reflux in the left lower extremity  CT ABDOMEN AND PELVIS W CONTRAST performed on 11/08/2020 1. Moderate-sized fat-containing periumbilical hernia 2. Mild hepatic steatosis 3. No acute abnormality seen in the abdomen or pelvis Aortic ATHEROSCLEROSIS  DG CERVICAL SPINE 1 VIEW DONE ON 05/27/2020 1. Multiple metallic opacities that project over the neck including what are suspected to be surgical sponges anterior to the C7 level.  MRI  CERVICAL SPINE WO CONTRAST performed on 05/26/2020 1. Multilevel degenerative change.  2. Disc and facet degeneration and spurring causing foraminal encroachment at multiple levels as above. 3. Large extruded disc fragment to the left of midline at C7-T1.  4. There is cord compression and ill-defined cord hyperintensity which may be acute.  LEXISCAN performed on 10/10/2017 1. Hyperdynamic LVEF of 72% 2. There is a medium defect of mild severity present in the apical septal, apical lateral, and apex location most consistent with artifact/chest wall  attenuation 3. No evidence of ischemia or infarction 4. This is a low risk normal study  ECHOCARDIOGRAM performed on 10/04/2017 1. Left ventricle:   The cavity size was normal.   Wall thickness was increased in  a pattern of mild LVH.   Systolic function was normal.   The estimated ejection fraction was in the range of 60% to 65%.   Wall motion was normal; there were no regional wall motion abnormalities.  Features are consistent with a pseudonormal left ventricular filling pattern, with concomitant abnormal relaxation and increased filling pressure (grade 2 diastolic dysfunction).  2. Aortic valve:  There was no stenosis.   There was trivial regurgitation.  3. Mitral valve:   Mildly to moderately calcified annulus.   There was trivial regurgitation.  4. Left atrium:   The atrium was mildly dilated.  5. Right ventricle:   The cavity size was normal.   Systolic function was normal.  6. Tricuspid valve:   Peak RV-RA gradient (S): 32 mm Hg.  7. Pulmonary arteries:   PA peak pressure: 40 mm Hg (S).  8. Systemic veins:   IVC measured 2.2 cm with > 50% respirophasic variation, suggesting RA pressure 8 mmHg.   Impression and Plan:  [name] has been referred for pre-anesthesia review and clearance prior to her undergoing the planned anesthetic and procedural courses. Available labs, pertinent testing, and imaging results were personally reviewed by me. This patient has been appropriately cleared by cardiology with an overall ACCEPTABLE risk stratification.   Based on clinical review performed today (11/25/20), barring any significant acute changes in the patient's overall condition, it is anticipated that she will be able to proceed with the planned surgical intervention. Any acute changes in clinical condition may necessitate her procedure being postponed and/or cancelled. Pre-surgical instructions were reviewed with the patient during her PAT appointment and  questions were fielded by PAT clinical staff.  Honor Loh, MSN, APRN, FNP-C, CEN [facility]  Peri-operative Services Nurse Practitioner Phone: [phone] 11/25/20 1:43 PM  NOTE: This note has been prepared using Dragon dictation software. Despite my best ability to proofread, there is always the potential that unintentional transcriptional errors may still occur from this process.

## 2020-11-25 NOTE — Patient Instructions (Signed)
Your procedure is scheduled on:12-02-2020 THURSDAY Report to the Registration Desk on the 1st floor of the Medical Mall. To find out your arrival time, please call [phone] between 1PM - 3PM on: 12-01-2020 [facility]  REMEMBER: Instructions that are not followed completely may result in serious medical risk, up to and including death; or upon the discretion of your surgeon and anesthesiologist your surgery may need to be rescheduled.  Do not eat food after midnight the night before surgery.  No gum chewing, lozengers or hard candies.  You may however, drink CLEAR liquids up to 2 hours before you are scheduled to arrive for your surgery. Do not drink anything within 2 hours of your scheduled arrival time.  Clear liquids include: - water  - apple juice without pulp - gatorade (not RED, PURPLE, OR BLUE) - black coffee or tea (Do NOT add milk or creamers to the coffee or tea) Do NOT drink anything that is not on this list.  Type 1 and Type 2 diabetics should only drink water.  TAKE THESE MEDICATIONS THE MORNING OF SURGERY WITH A SIP OF WATER: CARVEDILOL LEVOTHYROXINE PROTONIX (take one the night before and one on the morning of surgery - helps to prevent nausea after surgery.)  Follow recommendations from Cardiologist, Pulmonologist or PCP regarding stopping Aspirin, Coumadin, Plavix, Eliquis, Pradaxa, or Pletal.  One week prior to surgery: Stop Anti-inflammatories (NSAIDS) such as Advil, Aleve, Ibuprofen, Motrin, Naproxen, Naprosyn and Aspirin based products such as Excedrin, Goodys Powder, BC Powder AND MELOXICAM Stop ANY OVER THE COUNTER supplements until after surgery. (However, you may continue taking Vitamin D, Vitamin B, and multivitamin up until the day before surgery.)  No Alcohol for 24 hours before or after surgery.  No Smoking including e-cigarettes for 24 hours prior to surgery.  No chewable tobacco products for at least 6 hours prior to surgery.  No nicotine patches  on the day of surgery.  Do not use any "recreational" drugs for at least a week prior to your surgery.  Please be advised that the combination of cocaine and anesthesia may have negative outcomes, up to and including death. If you test positive for cocaine, your surgery will be cancelled.  On the morning of surgery brush your teeth with toothpaste and water, you may rinse your mouth with mouthwash if you wish. Do not swallow any toothpaste or mouthwash.  Do not wear jewelry, make-up, hairpins, clips or nail polish.  Do not wear lotions, powders, or perfumes or deodorant   Do not shave body from the neck down 48 hours prior to surgery just in case you cut yourself which could leave a site for infection.  Also, freshly shaved skin may become irritated if using the CHG soap.  Contact lenses, hearing aids and dentures may not be worn into surgery.  Do not bring valuables to the hospital. [facility] is not responsible for any missing/lost belongings or valuables.   Use CHG Soap  as directed on instruction sheet.  Notify your doctor if there is any change in your medical condition (cold, fever, infection).  Wear comfortable clothing (specific to your surgery type) to the hospital.  Plan for stool softeners for home use; pain medications have a tendency to cause constipation. You can also help prevent constipation by eating foods high in fiber such as fruits and vegetables and drinking plenty of fluids as your diet allows.  After surgery, you can help prevent lung complications by doing breathing exercises.  Take deep breaths and  cough every 1-2 hours. Your doctor may order a device called an Incentive Spirometer to help you take deep breaths. When coughing or sneezing, hold a pillow firmly against your incision with both hands. This is called "splinting." Doing this helps protect your incision. It also decreases belly discomfort.  If you are being discharged the day of surgery, you will  not be allowed to drive home. You will need a responsible adult (18 years or older) to drive you home and stay with you that night.    Please call the Pre-admissions Testing Dept. at [phone] if you have any questions about these instructions.  Visitation Policy:  Patients undergoing a surgery or procedure may have one family member or support person with them as long as that person is not COVID-19 positive or experiencing its symptoms.  That person may remain in the waiting area during the procedure.  Inpatient Visitation Update:   In an effort to ensure the safety of our team members and our patients, we are implementing a change to our visitation policy:  Effective Monday, Aug. 9, at 7 a.m., inpatients will be allowed one support person.  o The support person may change daily.  o The support person must pass our screening, gel in and out, and wear a mask at all times, including in the patient's room.  o Patients must also wear a mask when staff or their support person are in the room.  o Masking is required regardless of vaccination status.  Systemwide, no visitors 17 or younger.

## 2020-11-30 ENCOUNTER — Other Ambulatory Visit: Payer: Self-pay

## 2020-11-30 ENCOUNTER — Other Ambulatory Visit: Payer: Self-pay | Admitting: Sports Medicine

## 2020-11-30 ENCOUNTER — Other Ambulatory Visit
Admission: RE | Admit: 2020-11-30 | Discharge: 2020-11-30 | Disposition: A | Discharge status: Home / Self Care | Payer: PPO | Source: Ambulatory Visit | Attending: Surgery | Admitting: Surgery

## 2020-11-30 DIAGNOSIS — Z20822 Contact with and (suspected) exposure to covid-19: Secondary | ICD-10-CM | POA: Insufficient documentation

## 2020-11-30 DIAGNOSIS — Z01812 Encounter for preprocedural laboratory examination: Secondary | ICD-10-CM | POA: Diagnosis not present

## 2020-11-30 DIAGNOSIS — M170 Bilateral primary osteoarthritis of knee: Secondary | ICD-10-CM

## 2020-12-01 ENCOUNTER — Other Ambulatory Visit: Payer: Self-pay | Admitting: Physician Assistant

## 2020-12-01 DIAGNOSIS — E039 Hypothyroidism, unspecified: Secondary | ICD-10-CM

## 2020-12-01 LAB — SARS CORONAVIRUS 2 (TAT 6-24 HRS): SARS Coronavirus 2: NEGATIVE

## 2020-12-01 MED ORDER — LEVOTHYROXINE SODIUM 150 MCG PO TABS: ORAL_TABLET | ORAL | 0 refills | Status: DC

## 2020-12-01 MED ORDER — LEVOTHYROXINE SODIUM 125 MCG PO TABS: ORAL_TABLET | ORAL | 0 refills | Status: DC

## 2020-12-02 ENCOUNTER — Ambulatory Visit: Payer: PPO | Admitting: Urgent Care

## 2020-12-02 ENCOUNTER — Encounter: Payer: Self-pay | Admitting: Surgery

## 2020-12-02 ENCOUNTER — Encounter
Admission: RE | Disposition: A | Discharge status: Home / Self Care | Payer: Self-pay | Source: Home / Self Care | Attending: Surgery

## 2020-12-02 ENCOUNTER — Other Ambulatory Visit: Payer: Self-pay

## 2020-12-02 ENCOUNTER — Ambulatory Visit
Admission: RE | Admit: 2020-12-02 | Discharge: 2020-12-02 | Disposition: A | Discharge status: Home / Self Care | Payer: PPO | Attending: Surgery | Admitting: Surgery

## 2020-12-02 DIAGNOSIS — Z9884 Bariatric surgery status: Secondary | ICD-10-CM | POA: Insufficient documentation

## 2020-12-02 DIAGNOSIS — Z9109 Other allergy status, other than to drugs and biological substances: Secondary | ICD-10-CM | POA: Diagnosis not present

## 2020-12-02 DIAGNOSIS — Z882 Allergy status to sulfonamides status: Secondary | ICD-10-CM | POA: Diagnosis not present

## 2020-12-02 DIAGNOSIS — K439 Ventral hernia without obstruction or gangrene: Secondary | ICD-10-CM | POA: Diagnosis not present

## 2020-12-02 DIAGNOSIS — K429 Umbilical hernia without obstruction or gangrene: Secondary | ICD-10-CM | POA: Diagnosis not present

## 2020-12-02 DIAGNOSIS — K432 Incisional hernia without obstruction or gangrene: Secondary | ICD-10-CM | POA: Insufficient documentation

## 2020-12-02 DIAGNOSIS — Z9049 Acquired absence of other specified parts of digestive tract: Secondary | ICD-10-CM | POA: Diagnosis not present

## 2020-12-02 HISTORY — DX: Iron deficiency anemia, unspecified: D50.9

## 2020-12-02 HISTORY — PX: VENTRAL HERNIA REPAIR: SHX424

## 2020-12-02 HISTORY — DX: Atherosclerosis of aorta: I70.0

## 2020-12-02 SURGERY — REPAIR, HERNIA, VENTRAL
Anesthesia: General | Site: Abdomen

## 2020-12-02 MED ORDER — KETAMINE HCL 50 MG/5ML IJ SOSY
PREFILLED_SYRINGE | INTRAMUSCULAR | Status: AC
  Filled 2020-12-02: qty 5

## 2020-12-02 MED ORDER — KETAMINE HCL 10 MG/ML IJ SOLN
INTRAMUSCULAR | Status: DC | PRN
  Administered 2020-12-02 (×2): 25 mg via INTRAVENOUS

## 2020-12-02 MED ORDER — LIDOCAINE HCL (CARDIAC) PF 100 MG/5ML IV SOSY
PREFILLED_SYRINGE | INTRAVENOUS | Status: DC | PRN
  Administered 2020-12-02: 100 mg via INTRAVENOUS

## 2020-12-02 MED ORDER — LACTATED RINGERS IV SOLN: INTRAVENOUS | Status: DC

## 2020-12-02 MED ORDER — CELECOXIB 200 MG PO CAPS
ORAL_CAPSULE | ORAL | Status: AC
  Administered 2020-12-02: 200 mg via ORAL
  Filled 2020-12-02: qty 1

## 2020-12-02 MED ORDER — GABAPENTIN 300 MG PO CAPS: 300.0000 mg | ORAL_CAPSULE | ORAL | Status: AC

## 2020-12-02 MED ORDER — SUGAMMADEX SODIUM 200 MG/2ML IV SOLN
INTRAVENOUS | Status: DC | PRN
  Administered 2020-12-02: 200 mg via INTRAVENOUS

## 2020-12-02 MED ORDER — ORAL CARE MOUTH RINSE: 15.0000 mL | Freq: Once | OROMUCOSAL | Status: AC

## 2020-12-02 MED ORDER — OXYCODONE HCL 5 MG/5ML PO SOLN: 5.0000 mg | Freq: Once | ORAL | Status: AC | PRN

## 2020-12-02 MED ORDER — ACETAMINOPHEN 500 MG PO TABS
ORAL_TABLET | ORAL | Status: AC
  Administered 2020-12-02: 1000 mg via ORAL
  Filled 2020-12-02: qty 2

## 2020-12-02 MED ORDER — BUPIVACAINE LIPOSOME 1.3 % IJ SUSP
INTRAMUSCULAR | Status: DC | PRN
  Administered 2020-12-02: 20 mL

## 2020-12-02 MED ORDER — DEXAMETHASONE SODIUM PHOSPHATE 10 MG/ML IJ SOLN
INTRAMUSCULAR | Status: DC | PRN
  Administered 2020-12-02: 10 mg via INTRAVENOUS

## 2020-12-02 MED ORDER — FENTANYL CITRATE (PF) 100 MCG/2ML IJ SOLN
INTRAMUSCULAR | Status: AC
  Filled 2020-12-02: qty 2

## 2020-12-02 MED ORDER — CHLORHEXIDINE GLUCONATE 0.12 % MT SOLN
OROMUCOSAL | Status: AC
  Administered 2020-12-02: 15 mL via OROMUCOSAL
  Filled 2020-12-02: qty 15

## 2020-12-02 MED ORDER — PROPOFOL 10 MG/ML IV BOLUS
INTRAVENOUS | Status: AC
  Filled 2020-12-02: qty 20

## 2020-12-02 MED ORDER — CHLORHEXIDINE GLUCONATE CLOTH 2 % EX PADS: 6.0000 | MEDICATED_PAD | Freq: Once | CUTANEOUS | Status: DC

## 2020-12-02 MED ORDER — FENTANYL CITRATE (PF) 100 MCG/2ML IJ SOLN
25.0000 ug | INTRAMUSCULAR | Status: DC | PRN
  Administered 2020-12-02: 50 ug via INTRAVENOUS
  Administered 2020-12-02: 25 ug via INTRAVENOUS

## 2020-12-02 MED ORDER — BUPIVACAINE LIPOSOME 1.3 % IJ SUSP
INTRAMUSCULAR | Status: AC
  Filled 2020-12-02: qty 20

## 2020-12-02 MED ORDER — CELECOXIB 200 MG PO CAPS: 200.0000 mg | ORAL_CAPSULE | ORAL | Status: AC

## 2020-12-02 MED ORDER — FENTANYL CITRATE (PF) 100 MCG/2ML IJ SOLN
INTRAMUSCULAR | Status: AC
  Administered 2020-12-02: 25 ug via INTRAVENOUS
  Filled 2020-12-02: qty 2

## 2020-12-02 MED ORDER — PHENYLEPHRINE HCL (PRESSORS) 10 MG/ML IV SOLN
INTRAVENOUS | Status: DC | PRN
  Administered 2020-12-02: 100 ug via INTRAVENOUS

## 2020-12-02 MED ORDER — ONDANSETRON HCL 4 MG/2ML IJ SOLN: 4.0000 mg | Freq: Once | INTRAMUSCULAR | Status: DC | PRN

## 2020-12-02 MED ORDER — MIDAZOLAM HCL 2 MG/2ML IJ SOLN
INTRAMUSCULAR | Status: AC
  Filled 2020-12-02: qty 2

## 2020-12-02 MED ORDER — CEFAZOLIN SODIUM-DEXTROSE 2-4 GM/100ML-% IV SOLN
2.0000 g | INTRAVENOUS | Status: AC
  Administered 2020-12-02: 2 g via INTRAVENOUS

## 2020-12-02 MED ORDER — MIDAZOLAM HCL 2 MG/2ML IJ SOLN
INTRAMUSCULAR | Status: DC | PRN
  Administered 2020-12-02: 1 mg via INTRAVENOUS

## 2020-12-02 MED ORDER — OXYCODONE HCL 5 MG PO TABS
ORAL_TABLET | ORAL | Status: AC
  Filled 2020-12-02: qty 1

## 2020-12-02 MED ORDER — CEFAZOLIN SODIUM-DEXTROSE 2-4 GM/100ML-% IV SOLN
INTRAVENOUS | Status: AC
  Filled 2020-12-02: qty 100

## 2020-12-02 MED ORDER — FENTANYL CITRATE (PF) 100 MCG/2ML IJ SOLN
INTRAMUSCULAR | Status: AC
  Administered 2020-12-02: 50 ug via INTRAVENOUS
  Filled 2020-12-02: qty 2

## 2020-12-02 MED ORDER — ROCURONIUM BROMIDE 100 MG/10ML IV SOLN
INTRAVENOUS | Status: DC | PRN
  Administered 2020-12-02: 40 mg via INTRAVENOUS
  Administered 2020-12-02 (×2): 10 mg via INTRAVENOUS

## 2020-12-02 MED ORDER — PROPOFOL 10 MG/ML IV BOLUS
INTRAVENOUS | Status: DC | PRN
  Administered 2020-12-02: 150 mg via INTRAVENOUS

## 2020-12-02 MED ORDER — DEXAMETHASONE SODIUM PHOSPHATE 10 MG/ML IJ SOLN
INTRAMUSCULAR | Status: AC
  Filled 2020-12-02: qty 1

## 2020-12-02 MED ORDER — BUPIVACAINE-EPINEPHRINE (PF) 0.25% -1:200000 IJ SOLN
INTRAMUSCULAR | Status: DC | PRN
  Administered 2020-12-02: 30 mL

## 2020-12-02 MED ORDER — ONDANSETRON HCL 4 MG/2ML IJ SOLN
INTRAMUSCULAR | Status: AC
  Filled 2020-12-02: qty 2

## 2020-12-02 MED ORDER — BUPIVACAINE-EPINEPHRINE (PF) 0.25% -1:200000 IJ SOLN
INTRAMUSCULAR | Status: AC
  Filled 2020-12-02: qty 30

## 2020-12-02 MED ORDER — KETOROLAC TROMETHAMINE 30 MG/ML IJ SOLN
INTRAMUSCULAR | Status: AC
  Filled 2020-12-02: qty 1

## 2020-12-02 MED ORDER — ROCURONIUM BROMIDE 10 MG/ML (PF) SYRINGE
PREFILLED_SYRINGE | INTRAVENOUS | Status: AC
  Filled 2020-12-02: qty 10

## 2020-12-02 MED ORDER — OXYCODONE HCL 5 MG PO TABS
5.0000 mg | ORAL_TABLET | Freq: Once | ORAL | Status: AC | PRN
  Administered 2020-12-02: 5 mg via ORAL

## 2020-12-02 MED ORDER — CHLORHEXIDINE GLUCONATE 0.12 % MT SOLN: 15.0000 mL | Freq: Once | OROMUCOSAL | Status: AC

## 2020-12-02 MED ORDER — ACETAMINOPHEN 500 MG PO TABS: 1000.0000 mg | ORAL_TABLET | ORAL | Status: AC

## 2020-12-02 MED ORDER — FENTANYL CITRATE (PF) 100 MCG/2ML IJ SOLN
INTRAMUSCULAR | Status: DC | PRN
  Administered 2020-12-02 (×2): 50 ug via INTRAVENOUS

## 2020-12-02 MED ORDER — GABAPENTIN 300 MG PO CAPS
ORAL_CAPSULE | ORAL | Status: AC
  Administered 2020-12-02: 300 mg via ORAL
  Filled 2020-12-02: qty 1

## 2020-12-02 MED ORDER — HYDROCODONE-ACETAMINOPHEN 5-325 MG PO TABS: 1.0000 | ORAL_TABLET | ORAL | 0 refills | Status: DC | PRN

## 2020-12-02 MED ORDER — ONDANSETRON HCL 4 MG/2ML IJ SOLN
INTRAMUSCULAR | Status: DC | PRN
  Administered 2020-12-02: 4 mg via INTRAVENOUS

## 2020-12-02 MED ORDER — KETOROLAC TROMETHAMINE 30 MG/ML IJ SOLN
INTRAMUSCULAR | Status: DC | PRN
  Administered 2020-12-02: 15 mg via INTRAVENOUS

## 2020-12-02 MED ORDER — LIDOCAINE HCL (PF) 2 % IJ SOLN
INTRAMUSCULAR | Status: AC
  Filled 2020-12-02: qty 5

## 2020-12-02 SURGICAL SUPPLY — 45 items
ADH SKN CLS APL DERMABOND .7 (GAUZE/BANDAGES/DRESSINGS) ×1
APL PRP STRL LF DISP 70% ISPRP (MISCELLANEOUS) ×1
APPLIER CLIP 11 MED OPEN (CLIP)
APPLIER CLIP 13 LRG OPEN (CLIP)
APR CLP LRG 13 20 CLIP (CLIP)
APR CLP MED 11 20 MLT OPN (CLIP)
BLADE CLIPPER SURG (BLADE) ×1 IMPLANT
CHLORAPREP W/TINT 26 (MISCELLANEOUS) ×2 IMPLANT
CLIP APPLIE 11 MED OPEN (CLIP) ×1 IMPLANT
CLIP APPLIE 13 LRG OPEN (CLIP) ×1 IMPLANT
COVER WAND RF STERILE (DRAPES) ×2 IMPLANT
DERMABOND ADVANCED (GAUZE/BANDAGES/DRESSINGS) ×1
DERMABOND ADVANCED .7 DNX12 (GAUZE/BANDAGES/DRESSINGS) IMPLANT
DRAPE LAPAROTOMY 100X77 ABD (DRAPES) ×2 IMPLANT
DRAPE MAG INST 16X20 L/F (DRAPES) ×1 IMPLANT
DRSG TELFA 3X8 NADH (GAUZE/BANDAGES/DRESSINGS) IMPLANT
ELECT CAUTERY BLADE 6.4 (BLADE) ×2 IMPLANT
ELECT REM PT RETURN 9FT ADLT (ELECTROSURGICAL) ×2
ELECTRODE REM PT RTRN 9FT ADLT (ELECTROSURGICAL) ×1 IMPLANT
GAUZE SPONGE 4X4 12PLY STRL (GAUZE/BANDAGES/DRESSINGS) ×1 IMPLANT
GLOVE BIO SURGEON STRL SZ7 (GLOVE) ×2 IMPLANT
GOWN STRL REUS W/ TWL LRG LVL3 (GOWN DISPOSABLE) ×2 IMPLANT
GOWN STRL REUS W/TWL LRG LVL3 (GOWN DISPOSABLE) ×4
LABEL OR SOLS (LABEL) ×1 IMPLANT
MANIFOLD NEPTUNE II (INSTRUMENTS) ×2 IMPLANT
MESH VENTRALEX ST 8CM LRG (Mesh General) ×1 IMPLANT
NDL HYPO 25X1 1.5 SAFETY (NEEDLE) ×1 IMPLANT
NEEDLE HYPO 22GX1.5 SAFETY (NEEDLE) ×2 IMPLANT
NEEDLE HYPO 25X1 1.5 SAFETY (NEEDLE) ×2 IMPLANT
PACK BASIN MINOR ARMC (MISCELLANEOUS) ×2 IMPLANT
PAD DRESSING TELFA 3X8 NADH (GAUZE/BANDAGES/DRESSINGS) ×1 IMPLANT
SPONGE LAP 18X18 RF (DISPOSABLE) ×2 IMPLANT
STAPLER SKIN PROX 35W (STAPLE) ×1 IMPLANT
SUT ETHIBOND 0 MO6 C/R (SUTURE) ×3 IMPLANT
SUT MNCRL 4-0 (SUTURE) ×4
SUT MNCRL 4-0 27XMFL (SUTURE) ×2
SUT VIC AB 2-0 SH 27 (SUTURE) ×4
SUT VIC AB 2-0 SH 27XBRD (SUTURE) ×2 IMPLANT
SUT VIC AB 3-0 SH 27 (SUTURE) ×4
SUT VIC AB 3-0 SH 27X BRD (SUTURE) IMPLANT
SUTURE MNCRL 4-0 27XMF (SUTURE) IMPLANT
SYR 10ML LL (SYRINGE) ×1 IMPLANT
SYR 20ML LL LF (SYRINGE) ×2 IMPLANT
TAPE MICROFOAM 4IN (TAPE) ×1 IMPLANT
WATER STERILE IRR 1000ML POUR (IV SOLUTION) ×1 IMPLANT

## 2020-12-02 NOTE — Progress Notes (Signed)
sats will frequently drop as low as 80. sats increase to 98 with incentive spirometry.  Pt is unlabored, denies dyspnea.  Breath sounds CTA.  Informed dr Orland Penman, will place pt in chair and continue to monitor in pacu at this time.

## 2020-12-02 NOTE — Transfer of Care (Signed)
Immediate Anesthesia Transfer of Care Note  Patient: [name]  Procedure(s) Performed: HERNIA REPAIR INCISIONAL  ADULT, open (N/A Abdomen)  Patient Location: PACU  Anesthesia Type:General  Level of Consciousness: awake and alert   Airway & Oxygen Therapy: Patient connected to face mask oxygen  Post-op Assessment: Post -op Vital signs reviewed and stable  Post vital signs: stable  Last Vitals:  Vitals Value Taken Time  BP 128/62 12/02/20 1137  Temp 36.2 C 12/02/20 1137  Pulse 70 12/02/20 1138  Resp 8 12/02/20 1138  SpO2 96 % 12/02/20 1138  Vitals shown include unvalidated device data.  Last Pain:  Vitals:   12/02/20 0915  TempSrc: Temporal  PainSc: 0-No pain         Complications: No complications documented.

## 2020-12-02 NOTE — Discharge Instructions (Signed)

## 2020-12-02 NOTE — Anesthesia Preprocedure Evaluation (Addendum)
Anesthesia Evaluation  Patient identified by MRN, date of birth, ID band Patient awake    Reviewed: Allergy & Precautions, H&P , NPO status , Patient's Chart, lab work & pertinent test results  History of Anesthesia Complications Negative for: history of anesthetic complications  Airway Mallampati: II  TM Distance: >3 FB     Dental  (+) Missing, Chipped, Poor Dentition   Pulmonary shortness of breath, neg sleep apnea, neg COPD,     + decreased breath sounds      Cardiovascular hypertension, (-) angina(-) Past MI and (-) Cardiac Stents (-) dysrhythmias  Rhythm:regular Rate:Normal     Neuro/Psych PSYCHIATRIC DISORDERS Anxiety negative neurological ROS     GI/Hepatic PUD, (+)     substance abuse  alcohol use,   Endo/Other  Hypothyroidism   Renal/GU      Musculoskeletal   Abdominal   Peds  Hematology negative hematology ROS (+)   Anesthesia Other Findings Past Medical History: 04/25/2015: Alcohol abuse     Comment:  reportedly drinks ~ 1 bottle of wine / day No date: Allergy 06/18/2016: Anastomotic ulcer S/P gastric bypass No date: Aortic atherosclerosis (HCC) No date: Arthritis No date: Cataract 10/25/2017: Chronic diastolic heart failure (HCC) No date: Degenerative disc disease, cervical 05/2020: DVT, bilateral lower limbs (HCC)     Comment:  Age-indeterminate bilateral PERONEAL VEIN DVTs --> (was               postop from acute cord compression, started on Xarelto               Dosepak 04/25/2015: Essential hypertension 06/14/2016: GAD (generalized anxiety disorder) 05/27/2020: HNP (herniated nucleus pulposus) with myelopathy, cervical     Comment:  Cervical Myelopathy with Spinal cord compression (HCC)                --> s/p Anterior Cervical Discectomy Decompression)  No date: Hypothyroidism No date: IDA (iron deficiency anemia) No date: Kidney damage     Comment:  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              in kidney 04/25/2015: UGIB (upper gastrointestinal bleed) 01/10/2019: Venous stasis of both lower extremities  Past Surgical History: No date: ABDOMINAL HYSTERECTOMY 05/26/2020: ANTERIOR CERVICAL DECOMP/DISCECTOMY FUSION; Left     Comment:  Procedure: Cervical seven - Thoracic one ANTERIOR               CERVICAL DISCECTOMY AND PLATING;  Surgeon: Tressie Stalker, MD;  Location: [facility];  Service: Neurosurgery;                Laterality: Left; No date: CHOLECYSTECTOMY No date: COSMETIC SURGERY     Comment:  extra fat taken off arms and eyelid lifted 04/25/2015: ESOPHAGOGASTRODUODENOSCOPY; N/A     Comment:  Procedure: ESOPHAGOGASTRODUODENOSCOPY (EGD);  Surgeon:               Beverley Fiedler, MD;  Location: [facility];  Service:               Endoscopy;  Laterality: N/A; No date: FRACTURE SURGERY 2003: GASTRIC BYPASS 04/25/2015: gastric ulcer repair 10/10/2017: NM MYOVIEW LTD     Comment:  Normal LV size and function EF 72%.  Medium sized mild               severity defect in the apical septal apical lateral and  apical wall suggestive of breast attenuation.  LOW RISK.               No ischemia or infarction.   01/15/2013: OPEN REDUCTION INTERNAL FIXATION (ORIF) DISTAL RADIAL  FRACTURE; Right     Comment:  Procedure: OPEN REDUCTION INTERNAL FIXATION (ORIF) Right              DISTAL RADIUS FRACTURE;  Surgeon: Eldred Manges, MD;                Location: [facility] OR;  Service: Orthopedics;  Laterality:               Right; No date: SPINE SURGERY 10/04/2017: TRANSTHORACIC ECHOCARDIOGRAM     Comment:  Mild LVH.  EF 60-65%.  Grade 2/moderate diastolic               dysfunction.  Mild pulmonary hypertension.  No valve               lesions  BMI    Body Mass Index: 36.38 kg/m      Reproductive/Obstetrics negative OB ROS                             Anesthesia Physical Anesthesia Plan  ASA: III  Anesthesia  Plan: General ETT   Post-op Pain Management:    Induction:   PONV Risk Score and Plan: Ondansetron, Dexamethasone, Midazolam and Treatment may vary due to age or medical condition  Airway Management Planned:   Additional Equipment:   Intra-op Plan:   Post-operative Plan:   Informed Consent: I have reviewed the patients History and Physical, chart, labs and discussed the procedure including the risks, benefits and alternatives for the proposed anesthesia with the patient or authorized representative who has indicated his/her understanding and acceptance.     Dental Advisory Given  Plan Discussed with: Anesthesiologist, CRNA and Surgeon  Anesthesia Plan Comments:        Anesthesia Quick Evaluation

## 2020-12-02 NOTE — Interval H&P Note (Signed)
History and Physical Interval Note:  12/02/2020 9:16 AM  [name]  has presented today for surgery, with the diagnosis of Ventral hernia.  The various methods of treatment have been discussed with the patient and family. After consideration of risks, benefits and other options for treatment, the patient has consented to  Procedure(s): HERNIA REPAIR VENTRAL ADULT, open (N/A) as a surgical intervention.  The patient's history has been reviewed, patient examined, no change in status, stable for surgery.  I have reviewed the patient's chart and labs.  Questions were answered to the patient's satisfaction.     Braidon Chermak F Eveline Sauve

## 2020-12-02 NOTE — Op Note (Signed)
Incisional Hernia Repair with Ventralex patch Mesh 8cm  BARD  Pre-operative Diagnosis:  Incisional hernia  Post-operative Diagnosis: same  Surgeon: Sterling Big, MD FACS  Anesthesia: Gen. with endotracheal tube   Findings: 3 cm  incisional hernia supraumbilical   Estimated Blood Loss: 10cc         Drains: none         Specimens: hernia sac       Complications: none              Procedure Details  The patient was seen again in the Holding Room. The benefits, complications, treatment options, and expected outcomes were discussed with the patient. The risks of bleeding, infection, recurrence of symptoms, failure to resolve symptoms, bowel injury, mesh placement, mesh infection, any of which could require further surgery were reviewed with the patient. The likelihood of improving the patient's symptoms with return to their baseline status is good.  The patient and/or family concurred with the proposed plan, giving informed consent.  The patient was taken to Operating Room, identified as [name] and the procedure verified.  A Time Out was held and the above information confirmed.  Prior to the induction of general anesthesia, antibiotic prophylaxis was administered. VTE prophylaxis was in place. General endotracheal anesthesia was then administered and tolerated well. After the induction, the abdomen was prepped with Chloraprep and draped in the sterile fashion. The patient was positioned in the supine position.  Incision was created with a scalpel over the hernia defect. Electrocautery was used to dissect through subcutaneous tissue, the hernia sac was opened and lysis of adhesion was performed with Metzenbaum's scissors. Hernia sac was excised. The hernia was measured  3 cms defect and the mesh was selected. Interrupted Ethibond sutures were placed on each corner of the mesh and were secure to the abdominal wall in a full thickness fashion.I closed the hernia defect with  interrupted 0 Ethibond sutures.   The mesh was placed in an underlay fashion. The mesh layed really nicely against the  abdominal wall There was no evidence of bowel injuries or any other injuries  Incisions was closed in a 2 layer fashion with 3-0 Vicryl and 4-0 Monocryl. Dermabond was used to coat the skin. Liposomal marcaine used to create an abdominal wall block in a full thickness fashion.. Patient tolerated procedure well and there were no immediate complications. Needle and laparotomy counts were correct   Sterling Big, MD, FACS

## 2020-12-02 NOTE — Anesthesia Procedure Notes (Signed)
Procedure Name: Intubation Date/Time: 12/02/2020 10:04 AM Performed by: Irving Burton, CRNA Pre-anesthesia Checklist: Patient identified, Emergency Drugs available, Suction available and Patient being monitored Patient Re-evaluated:Patient Re-evaluated prior to induction Oxygen Delivery Method: Circle system utilized Preoxygenation: Pre-oxygenation with 100% oxygen Induction Type: IV induction Ventilation: Mask ventilation without difficulty Laryngoscope Size: McGraph and 3 Grade View: Grade I Tube type: Oral Tube size: 7.0 mm Number of attempts: 1 Airway Equipment and Method: Stylet and Video-laryngoscopy Placement Confirmation: ETT inserted through vocal cords under direct vision,  positive ETCO2 and breath sounds checked- equal and bilateral Secured at: 20 cm Tube secured with: Tape Dental Injury: Teeth and Oropharynx as per pre-operative assessment

## 2020-12-03 LAB — SURGICAL PATHOLOGY

## 2020-12-09 ENCOUNTER — Telehealth: Payer: Self-pay

## 2020-12-09 NOTE — Telephone Encounter (Signed)
[employer] requested a return call regarding prior authorization for patient's tramadol. Form was faxed to office.  Form completed and returned via fax.  Called [employer] to verify receipt. Receivied 2 week authorization effective until 12/22/2020.  Spoke with Kathlene November at [facility] to make them aware of approval.

## 2020-12-13 NOTE — Anesthesia Postprocedure Evaluation (Signed)
Anesthesia Post Note  Patient: [name]  Procedure(s) Performed: HERNIA REPAIR INCISIONAL  ADULT, open (N/A Abdomen)  Patient location during evaluation: PACU Anesthesia Type: General Level of consciousness: awake and alert Pain management: pain level controlled Vital Signs Assessment: post-procedure vital signs reviewed and stable Respiratory status: spontaneous breathing, nonlabored ventilation and respiratory function stable Cardiovascular status: blood pressure returned to baseline and stable Postop Assessment: no apparent nausea or vomiting Anesthetic complications: no   No complications documented.   Last Vitals:  Vitals:   12/02/20 1303 12/02/20 1322  BP: (!) 156/83 (!) 143/68  Pulse: 61 63  Resp: 12 14  Temp:  (!) 36.2 C  SpO2: 99% 96%    Last Pain:  Vitals:   12/03/20 1008  TempSrc:   PainSc: 2                  Karleen Hampshire

## 2020-12-22 ENCOUNTER — Encounter: Payer: Medicare PPO | Admitting: Surgery

## 2020-12-27 ENCOUNTER — Ambulatory Visit (INDEPENDENT_AMBULATORY_CARE_PROVIDER_SITE_OTHER): Payer: PPO | Admitting: Surgery

## 2020-12-27 ENCOUNTER — Other Ambulatory Visit: Payer: Self-pay

## 2020-12-27 ENCOUNTER — Encounter: Payer: Self-pay | Admitting: Surgery

## 2020-12-27 VITALS — BP 137/80 | HR 71 | Temp 98.7°F | Ht 65.5 in | Wt 233.2 lb

## 2020-12-27 DIAGNOSIS — Z09 Encounter for follow-up examination after completed treatment for conditions other than malignant neoplasm: Secondary | ICD-10-CM

## 2020-12-27 NOTE — Patient Instructions (Addendum)
If you have any concerns or questions, please feel free to call our office.     GENERAL POST-OPERATIVE PATIENT INSTRUCTIONS   WOUND CARE INSTRUCTIONS:  Keep a dry clean dressing on the wound if there is drainage. The initial bandage may be removed after 24 hours.  Once the wound has quit draining you may leave it open to air.  If clothing rubs against the wound or causes irritation and the wound is not draining you may cover it with a dry dressing during the daytime.  Try to keep the wound dry and avoid ointments on the wound unless directed to do so.  If the wound becomes bright red and painful or starts to drain infected material that is not clear, please contact your physician immediately.  If the wound is mildly pink and has a thick firm ridge underneath it, this is normal, and is referred to as a healing ridge.  This will resolve over the next 4-6 weeks.  BATHING: You may shower if you have been informed of this by your surgeon. However, Please do not submerge in a tub, hot tub, or pool until incisions are completely sealed or have been told by your surgeon that you may do so.  DIET:  You may eat any foods that you can tolerate.  It is a good idea to eat a high fiber diet and take in plenty of fluids to prevent constipation.  If you do become constipated you may want to take a mild laxative or take ducolax tablets on a daily basis until your bowel habits are regular.  Constipation can be very uncomfortable, along with straining, after recent surgery.  ACTIVITY:  You are encouraged to cough and deep breath or use your incentive spirometer if you were given one, every 15-30 minutes when awake.  This will help prevent respiratory complications and low grade fevers post-operatively if you had a general anesthetic.  You may want to hug a pillow when coughing and sneezing to add additional support to the surgical area, if you had abdominal or chest surgery, which will decrease pain during these times.   You are encouraged to walk and engage in light activity for the next two weeks.  You should not lift more than 15 pounds, until 01/13/2021 as it could put you at increased risk for complications.  Twenty pounds is roughly equivalent to a plastic bag of groceries. At that time- Listen to your body when lifting, if you have pain when lifting, stop and then try again in a few days. Soreness after doing exercises or activities of daily living is normal as you get back in to your normal routine.  MEDICATIONS:  Try to take narcotic medications and anti-inflammatory medications, such as tylenol, ibuprofen, naprosyn, etc., with food.  This will minimize stomach upset from the medication.  Should you develop nausea and vomiting from the pain medication, or develop a rash, please discontinue the medication and contact your physician.  You should not drive, make important decisions, or operate machinery when taking narcotic pain medication.  SUNBLOCK Use sun block to incision area over the next year if this area will be exposed to sun. This helps decrease scarring and will allow you avoid a permanent darkened area over your incision.

## 2020-12-28 ENCOUNTER — Encounter: Payer: Self-pay | Admitting: Surgery

## 2020-12-28 NOTE — Progress Notes (Signed)
Status post open incisional hernia repair.  She is doing very well.  No fevers no chills tolerating diet.  Has no pain  PE NAD Abd; soft nontender no peritonitis no evidence of recurrent hernia or infection  A/P doing very well without complications.  No heavy lifting RTC as needed

## 2020-12-29 ENCOUNTER — Ambulatory Visit (INDEPENDENT_AMBULATORY_CARE_PROVIDER_SITE_OTHER): Payer: PPO | Admitting: Physician Assistant

## 2020-12-29 ENCOUNTER — Other Ambulatory Visit: Payer: Self-pay

## 2020-12-29 ENCOUNTER — Telehealth: Payer: Self-pay | Admitting: Physician Assistant

## 2020-12-29 VITALS — Ht 65.5 in | Wt 233.0 lb

## 2020-12-29 DIAGNOSIS — R29898 Other symptoms and signs involving the musculoskeletal system: Secondary | ICD-10-CM

## 2020-12-29 DIAGNOSIS — L97911 Non-pressure chronic ulcer of unspecified part of right lower leg limited to breakdown of skin: Secondary | ICD-10-CM

## 2020-12-29 DIAGNOSIS — G8929 Other chronic pain: Secondary | ICD-10-CM

## 2020-12-29 DIAGNOSIS — Z23 Encounter for immunization: Secondary | ICD-10-CM

## 2020-12-29 DIAGNOSIS — L97921 Non-pressure chronic ulcer of unspecified part of left lower leg limited to breakdown of skin: Secondary | ICD-10-CM

## 2020-12-29 MED ORDER — CYCLOBENZAPRINE HCL 10 MG PO TABS: 10.0000 mg | ORAL_TABLET | Freq: Every day | ORAL | 0 refills | Status: DC | PRN

## 2020-12-29 NOTE — Progress Notes (Signed)
Patient tolerated vaccine well. VIS given. AS, CMA 

## 2020-12-29 NOTE — Telephone Encounter (Signed)
Pt requesting refill of flexaril and referral to wound care center for leg ulcer. Orders have been placed. AS, CMA

## 2020-12-31 ENCOUNTER — Encounter (HOSPITAL_BASED_OUTPATIENT_CLINIC_OR_DEPARTMENT_OTHER): Payer: PPO | Attending: Internal Medicine | Admitting: Internal Medicine

## 2020-12-31 ENCOUNTER — Other Ambulatory Visit: Payer: Self-pay

## 2020-12-31 DIAGNOSIS — Z9884 Bariatric surgery status: Secondary | ICD-10-CM | POA: Diagnosis not present

## 2020-12-31 DIAGNOSIS — I509 Heart failure, unspecified: Secondary | ICD-10-CM | POA: Diagnosis not present

## 2020-12-31 DIAGNOSIS — Z86718 Personal history of other venous thrombosis and embolism: Secondary | ICD-10-CM | POA: Diagnosis not present

## 2020-12-31 DIAGNOSIS — I110 Hypertensive heart disease with heart failure: Secondary | ICD-10-CM | POA: Insufficient documentation

## 2020-12-31 DIAGNOSIS — L97822 Non-pressure chronic ulcer of other part of left lower leg with fat layer exposed: Secondary | ICD-10-CM | POA: Insufficient documentation

## 2020-12-31 DIAGNOSIS — L97812 Non-pressure chronic ulcer of other part of right lower leg with fat layer exposed: Secondary | ICD-10-CM | POA: Insufficient documentation

## 2020-12-31 DIAGNOSIS — I890 Lymphedema, not elsewhere classified: Secondary | ICD-10-CM | POA: Diagnosis not present

## 2020-12-31 DIAGNOSIS — I872 Venous insufficiency (chronic) (peripheral): Secondary | ICD-10-CM | POA: Diagnosis not present

## 2021-01-03 NOTE — Progress Notes (Signed)
[name], [name] ([id number]) Visit Report for 12/31/2020 HPI Details Patient Name: Date of Service: [name], [name] 12/31/2020 1:15 PM Medical Record Number: [id number] Patient Account Number: [id number] Date of Birth/Sex: Treating RN: [birth year] (75 y.o. [name] StandardF) [name], [name] Primary Care Provider: [name], [name] Other Clinician: Referring Provider: Treating Provider/Extender: [name] Charityobson, [name] Jerrett [name], [name] Weeks in Treatment: 0 History of Present Illness HPI Description: 07/09/18 on evaluation today patient presents for bilateral lower extremity ulcers. She has a past medical history significant for chronic venous stasis, hypertension, and lower extremity edema with associated skin changes. Unfortunately she tells me that roughly 3 weeks ago she noted ulcers on the left lower extremity which started given her trouble. Subsequently she ended up proceeding with applying mupirocin ointment and was keeping this open air for the most part. She states that it would scab over somewhat and then reopened. The wounds have been training quite significantly. Fortunately there does not appear to be any evidence of overt infection there is some erythematous type skin changes but I believe this is likely stasis dermatitis nothing more. Fortunately she has no evidence of any systemic infection. No fevers, chills, nausea, or vomiting noted at this time. She has been recommended before to wear compression stockings but she does not wear them on a regular basis. She has never been suggested to get Juxta-Lite compression wraps she was not aware of what these were. Patient had venous studies performed on 11/09/17 which showed no lower surety DVT or deep vein insufficiency noted. She did not have evaluation however of the superficial veins. There was full compressibility noted throughout. Her arterial study which was performed on 11/18/17 actually showed that she has an ABI on the right of 1.17 with a TBI of  1.59 along with an ABI of 1.12 on the left and a TBI of 0.99. 07/17/18 on evaluation today patient appears to be doing much better in regard to her left lower Trinity ulcers. She's been tolerating the dressing changes without complication. There does not appear to be any evidence of infection which is good news. Overall I'm very pleased with the progress at this time. 07/24/18 on evaluation today patient's wound actually appears to be completely healed at this time. Fortunately there does not appear to be any evidence of infection which is good news. Overall I'm very happy with the progress that has been made. She did get her Velcro compression wraps. READMISSION 12/31/2020 This is a patient we had in this clinic in 2019 felt to have chronic venous insufficiency wounds on her lower extremities. She was discharged with bilateral compression stockings. She tells us that she never really wore them or at best wore them sparingly. She is back in clinic today having a ulcer on her left lateral lower extremity since October and one on the right anterior for the last 2 weeks. I do not know that she is doing any primary dressing on these certainly not wearing any form of compression. She is already been evaluated by vein and vascular on 11/15/2020. They noted that she did have ulcers on the left leg in October that healed with a need Unna boot. She had a reflux study on 12/20 that did not show evidence of a DVT or venous reflux bilaterally. They was not felt that she had arterial insufficiency based on the fact that she had brisk biphasic Doppler signals in the bilateral DP PT. The patient comes in today with superficial wounds on the right anterior mid tibia and the left  lateral calf. These are small appear to have healthy surfaces. Past medical history includes cervical and upper thoracic spine surgery on 7/21, hernia repair early in January, hypertension, hyperlipidemia, hypothyroidism, gastric bypass surgery,  DVT in the right peroneal artery and left popliteal and peroneal veins. Next problem CHF. She does not have diabetes ABIs in our clinic were 1.1 on the right and 1.2 on the lower Electronic Signature(s) Signed: 12/31/2020 4:22:00 PM By: [name] Najjar MD Entered By: [name] on 12/31/2020 15:11:49 -------------------------------------------------------------------------------- Physical Exam Details Patient Name: Date of Service: [name], [name] Bray H. 12/31/2020 1:15 PM Medical Record Number: [id number] Patient Account Number: [id number] Date of Birth/Sex: Treating RN: 25-Sep-1946 (75 y.o. [name] Primary Care Provider: Mayer Masker Other Clinician: Referring Provider: Treating Provider/Extender: [name] in Treatment: 0 Constitutional Patient is hypertensive.. Pulse regular and within target range for patient.Marland Kitchen Respirations regular, non-labored and within target range.. Temperature is normal and within the target range for the patient.Marland Kitchen Appears in no distress. Cardiovascular Pedal pulses are palpable bilaterally. Nonpitting edema from the knees down. I did not see any evidence of swelling in her thighs she does have some varicosities. Notes Wound exam; the patient has small clean wounds on the right anterior mid tibia and the left lateral calf. There is no evidence of infection here. The most abnormal thing she has is thick fibrotic skin on the predominantly the left leg but also the right leg. On the left there is a cobblestone appearance to things. She does not have any other skin issues anywhere else. Electronic Signature(s) Signed: 12/31/2020 4:22:00 PM By: [name] Najjar MD Entered By: [name] on 12/31/2020 15:14:45 -------------------------------------------------------------------------------- Physician Orders Details Patient Name: Date of Service: [name], [name] Bray H. 12/31/2020 1:15 PM Medical Record Number:  [id number] Patient Account Number: [id number] Date of Birth/Sex: Treating RN: May 30, 1946 (75 y.o. [name] Primary Care Provider: Mayer Masker Other Clinician: Referring Provider: Treating Provider/Extender: [name] in Treatment: 0 Verbal / Phone Orders: No Diagnosis Coding Follow-up Appointments Return Appointment in 1 week. Bathing/ Shower/ Hygiene May shower with protection but do not get wound dressing(s) wet. - use cast protectors to cover wraps in the shower Edema Control - Lymphedema / SCD / Other Bilateral Lower Extremities Elevate legs to the level of the heart or above for 30 minutes daily and/or when sitting, a frequency of: - 3-4 times per day Avoid standing for long periods of time. Patient to wear own compression stockings every day. Exercise regularly Wound Treatment Wound #4 - Lower Leg Wound Laterality: Left, Lateral Peri-Wound Care: Sween Lotion (Moisturizing lotion) 1 x Per Week/7 Days Discharge Instructions: Apply moisturizing lotion as directed Prim Dressing: KerraCel Ag Gelling Fiber Dressing, 2x2 in (silver alginate) 1 x Per Week/7 Days ary Discharge Instructions: Apply silver alginate to wound bed as instructed Secondary Dressing: Woven Gauze Sponge, Non-Sterile 4x4 in 1 x Per Week/7 Days Discharge Instructions: Apply over primary dressing as directed. Compression Wrap: FourPress (4 layer compression wrap) 1 x Per Week/7 Days Discharge Instructions: Apply four layer compression as directed. Wound #5 - Lower Leg Wound Laterality: Right, Anterior Peri-Wound Care: Sween Lotion (Moisturizing lotion) 1 x Per Week/7 Days Discharge Instructions: Apply moisturizing lotion as directed Prim Dressing: KerraCel Ag Gelling Fiber Dressing, 2x2 in (silver alginate) 1 x Per Week/7 Days ary Discharge Instructions: Apply silver alginate to wound bed as instructed Secondary Dressing: Woven Gauze Sponge, Non-Sterile 4x4 in 1 x Per  Week/7 Days Discharge Instructions: Apply  over primary dressing as directed. Compression Wrap: FourPress (4 layer compression wrap) 1 x Per Week/7 Days Discharge Instructions: Apply four layer compression as directed. Electronic Signature(s) Signed: 12/31/2020 4:22:00 PM By: [name] Najjar MD Signed: 12/31/2020 4:47:51 PM By: Zenaida Deed RN, BSN Entered By: Zenaida Deed on 12/31/2020 14:48:17 -------------------------------------------------------------------------------- Problem List Details Patient Name: Date of Service: [name], [name] Bray H. 12/31/2020 1:15 PM Medical Record Number: [id number] Patient Account Number: [id number] Date of Birth/Sex: Treating RN: [birth year] (75 y.o. Billy Coast, [name] Primary Care Provider: Mayer Masker Other Clinician: Referring Provider: Treating Provider/Extender: [name] in Treatment: 0 Active Problems ICD-10 Encounter Code Description Active Date MDM Diagnosis L97.812 Non-pressure chronic ulcer of other part of right lower leg with fat layer 12/31/2020 No Yes exposed L97.822 Non-pressure chronic ulcer of other part of left lower leg with fat layer exposed2/02/2021 No Yes I89.0 Lymphedema, not elsewhere classified 12/31/2020 No Yes Inactive Problems Resolved Problems Electronic Signature(s) Signed: 12/31/2020 4:22:00 PM By: [name] Najjar MD Entered By: [name] on 12/31/2020 15:07:11 -------------------------------------------------------------------------------- Progress Note Details Patient Name: Date of Service: [name], [name] 12/31/2020 1:15 PM Medical Record Number: [id number] Patient Account Number: [id number] Date of Birth/Sex: Treating RN: [birth year] (74 y.o. [name] Primary Care Provider: Mayer Masker Other Clinician: Referring Provider: Treating Provider/Extender: [name] in Treatment: 0 Subjective History of Present Illness  (HPI) 07/09/18 on evaluation today patient presents for bilateral lower extremity ulcers. She has a past medical history significant for chronic venous stasis, hypertension, and lower extremity edema with associated skin changes. Unfortunately she tells me that roughly 3 weeks ago she noted ulcers on the left lower extremity which started given her trouble. Subsequently she ended up proceeding with applying mupirocin ointment and was keeping this open air for the most part. She states that it would scab over somewhat and then reopened. The wounds have been training quite significantly. Fortunately there does not appear to be any evidence of overt infection there is some erythematous type skin changes but I believe this is likely stasis dermatitis nothing more. Fortunately she has no evidence of any systemic infection. No fevers, chills, nausea, or vomiting noted at this time. She has been recommended before to wear compression stockings but she does not wear them on a regular basis. She has never been suggested to get Juxta-Lite compression wraps she was not aware of what these were. Patient had venous studies performed on 11/09/17 which showed no lower surety DVT or deep vein insufficiency noted. She did not have evaluation however of the superficial veins. There was full compressibility noted throughout. Her arterial study which was performed on 11/18/17 actually showed that she has an ABI on the right of 1.17 with a TBI of 1.59 along with an ABI of 1.12 on the left and a TBI of 0.99. 07/17/18 on evaluation today patient appears to be doing much better in regard to her left lower Trinity ulcers. She's been tolerating the dressing changes without complication. There does not appear to be any evidence of infection which is good news. Overall I'm very pleased with the progress at this time. 07/24/18 on evaluation today patient's wound actually appears to be completely healed at this time. Fortunately there  does not appear to be any evidence of infection which is good news. Overall I'm very happy with the progress that has been made. She did get her Velcro compression wraps. READMISSION 12/31/2020 This is a patient we had in this clinic in  2019 felt to have chronic venous insufficiency wounds on her lower extremities. She was discharged with bilateral compression stockings. She tells [place] that she never really wore them or at best wore them sparingly. She is back in clinic today having a ulcer on her left lateral lower extremity since October and one on the right anterior for the last 2 weeks. I do not know that she is doing any primary dressing on these certainly not wearing any form of compression. She is already been evaluated by vein and vascular on 11/15/2020. They noted that she did have ulcers on the left leg in October that healed with a need Unna boot. She had a reflux study on 12/20 that did not show evidence of a DVT or venous reflux bilaterally. They was not felt that she had arterial insufficiency based on the fact that she had brisk biphasic Doppler signals in the bilateral DP PT. The patient comes in today with superficial wounds on the right anterior mid tibia and the left lateral calf. These are small appear to have healthy surfaces. Past medical history includes cervical and upper thoracic spine surgery on 7/21, hernia repair early in January, hypertension, hyperlipidemia, hypothyroidism, gastric bypass surgery, DVT in the right peroneal artery and left popliteal and peroneal veins. Next problem CHF. She does not have diabetes ABIs in our clinic were 1.1 on the right and 1.2 on the lower Patient History Information obtained from Patient. Allergies cocoa (Severity: Severe, Reaction: anaphylaxis), red dye (Severity: Severe, Reaction: hives), Lyrica (Severity: Severe, Reaction: nose bleeds), Sulfa (Sulfonamide Antibiotics) (Severity: Moderate, Reaction: rash) Family History Cancer -  Mother,Father, Diabetes - Father, Heart Disease - Father, Hypertension - Father, Stroke - Mother, No family history of Hereditary Spherocytosis, Kidney Disease, Lung Disease, Seizures, Thyroid Problems, Tuberculosis. Social History Never smoker, Marital Status - Married, Alcohol Use - Daily - 1.5 bottles of wine daily, Drug Use - No History, Caffeine Use - Daily - coffee. Medical History Eyes Patient has history of Cataracts - mild Denies history of Glaucoma, Optic Neuritis Ear/Nose/Mouth/Throat Denies history of Chronic sinus problems/congestion, Middle ear problems Hematologic/Lymphatic Patient has history of Anemia - iron deficiency, Lymphedema Denies history of Hemophilia, Human Immunodeficiency Virus, Sickle Cell Disease Respiratory Denies history of Aspiration, Asthma, Chronic Obstructive Pulmonary Disease (COPD), Pneumothorax, Sleep Apnea, Tuberculosis Cardiovascular Patient has history of Congestive Heart Failure, Hypertension, Peripheral Venous Disease Denies history of Angina, Arrhythmia, Coronary Artery Disease, Deep Vein Thrombosis, Hypotension, Myocardial Infarction, Peripheral Arterial Disease, Phlebitis, Vasculitis Gastrointestinal Denies history of Cirrhosis , Colitis, Crohnoos, Hepatitis A, Hepatitis B, Hepatitis C Endocrine Denies history of Type I Diabetes, Type II Diabetes Genitourinary Denies history of End Stage Renal Disease Immunological Denies history of Lupus Erythematosus, Raynaudoos, Scleroderma Integumentary (Skin) Denies history of History of Burn Musculoskeletal Patient has history of Osteoarthritis Denies history of Gout, Rheumatoid Arthritis, Osteomyelitis Neurologic Patient has history of Neuropathy Denies history of Dementia, Quadriplegia, Paraplegia, Seizure Disorder Oncologic Denies history of Received Chemotherapy, Received Radiation Psychiatric Patient has history of Confinement Anxiety Denies history of  Anorexia/bulimia Hospitalization/Surgery History - right arm surgery. - gastric bypass. - hysterectomy. - tonsillectomy. - hernia repair. - cervical myelopathy with spinal cord compression. Medical A Surgical History Notes nd Constitutional Symptoms (General Health) obesity Ear/Nose/Mouth/Throat seasonal allergies Cardiovascular aortic murmur, hyperlipidemia, hypertriglyceridemia Gastrointestinal UGI bleed, Endocrine hypothyroidism Review of Systems (ROS) Constitutional Symptoms (General Health) Denies complaints or symptoms of Fatigue, Fever, Chills, Marked Weight Change. Eyes Complains or has symptoms of Glasses / Contacts - glasses. Denies complaints  or symptoms of Dry Eyes, Vision Changes. Ear/Nose/Mouth/Throat Denies complaints or symptoms of Chronic sinus problems or rhinitis. Respiratory Denies complaints or symptoms of Chronic or frequent coughs, Shortness of Breath. Cardiovascular Denies complaints or symptoms of Chest pain. Gastrointestinal Denies complaints or symptoms of Frequent diarrhea, Nausea, Vomiting. Endocrine Denies complaints or symptoms of Heat/cold intolerance. Genitourinary Denies complaints or symptoms of Frequent urination. Integumentary (Skin) Complains or has symptoms of Wounds. Musculoskeletal Denies complaints or symptoms of Muscle Pain, Muscle Weakness. Neurologic Denies complaints or symptoms of Numbness/parasthesias. Psychiatric Denies complaints or symptoms of Claustrophobia, Suicidal. Objective Constitutional Patient is hypertensive.. Pulse regular and within target range for patient.Marland Kitchen Respirations regular, non-labored and within target range.. Temperature is normal and within the target range for the patient.Marland Kitchen Appears in no distress. Vitals Time Taken: 1:29 PM, Height: 65 in, Source: Stated, Weight: 200 lbs, Source: Stated, BMI: 33.3, Temperature: 98 F, Pulse: 74 bpm, Respiratory Rate: 18 breaths/min, Blood Pressure: 157/74  mmHg. Cardiovascular Pedal pulses are palpable bilaterally. Nonpitting edema from the knees down. I did not see any evidence of swelling in her thighs she does have some varicosities. General Notes: Wound exam; the patient has small clean wounds on the right anterior mid tibia and the left lateral calf. There is no evidence of infection here. The most abnormal thing she has is thick fibrotic skin on the predominantly the left leg but also the right leg. On the left there is a cobblestone appearance to things. She does not have any other skin issues anywhere else. Integumentary (Hair, Skin) Wound #4 status is Open. Original cause of wound was Gradually Appeared. The wound is located on the Left,Lateral Lower Leg. The wound measures 1.2cm length x 1.2cm width x 0.1cm depth; 1.131cm^2 area and 0.113cm^3 volume. There is no tunneling or undermining noted. There is a medium amount of serosanguineous drainage noted. The wound margin is distinct with the outline attached to the wound base. There is large (67-100%) red, pink granulation within the wound bed. There is a small (1-33%) amount of necrotic tissue within the wound bed including Adherent Slough. Wound #5 status is Open. Original cause of wound was Gradually Appeared. The wound is located on the Right,Anterior Lower Leg. The wound measures 1.2cm length x 0.6cm width x 0.1cm depth; 0.565cm^2 area and 0.057cm^3 volume. There is no tunneling or undermining noted. There is a medium amount of serosanguineous drainage noted. The wound margin is distinct with the outline attached to the wound base. There is large (67-100%) red, pink granulation within the wound bed. There is no necrotic tissue within the wound bed. Assessment Active Problems ICD-10 Non-pressure chronic ulcer of other part of right lower leg with fat layer exposed Non-pressure chronic ulcer of other part of left lower leg with fat layer exposed Lymphedema, not elsewhere  classified Procedures Wound #4 Pre-procedure diagnosis of Wound #4 is a Venous Leg Ulcer located on the Left,Lateral Lower Leg . There was a Four Layer Compression Therapy Procedure by Shawn Stall, RN. Post procedure Diagnosis Wound #4: Same as Pre-Procedure Wound #5 Pre-procedure diagnosis of Wound #5 is a Venous Leg Ulcer located on the Right,Anterior Lower Leg . There was a Four Layer Compression Therapy Procedure by Shawn Stall, RN. Post procedure Diagnosis Wound #5: Same as Pre-Procedure Plan Follow-up Appointments: Return Appointment in 1 week. Bathing/ Shower/ Hygiene: May shower with protection but do not get wound dressing(s) wet. - use cast protectors to cover wraps in the shower Edema Control - Lymphedema / SCD / Other:  Elevate legs to the level of the heart or above for 30 minutes daily and/or when sitting, a frequency of: - 3-4 times per day Avoid standing for long periods of time. Patient to wear own compression stockings every day. Exercise regularly WOUND #4: - Lower Leg Wound Laterality: Left, Lateral Peri-Wound Care: Sween Lotion (Moisturizing lotion) 1 x Per Week/7 Days Discharge Instructions: Apply moisturizing lotion as directed Prim Dressing: KerraCel Ag Gelling Fiber Dressing, 2x2 in (silver alginate) 1 x Per Week/7 Days ary Discharge Instructions: Apply silver alginate to wound bed as instructed Secondary Dressing: Woven Gauze Sponge, Non-Sterile 4x4 in 1 x Per Week/7 Days Discharge Instructions: Apply over primary dressing as directed. Com pression Wrap: FourPress (4 layer compression wrap) 1 x Per Week/7 Days Discharge Instructions: Apply four layer compression as directed. WOUND #5: - Lower Leg Wound Laterality: Right, Anterior Peri-Wound Care: Sween Lotion (Moisturizing lotion) 1 x Per Week/7 Days Discharge Instructions: Apply moisturizing lotion as directed Prim Dressing: KerraCel Ag Gelling Fiber Dressing, 2x2 in (silver alginate) 1 x Per Week/7  Days ary Discharge Instructions: Apply silver alginate to wound bed as instructed Secondary Dressing: Woven Gauze Sponge, Non-Sterile 4x4 in 1 x Per Week/7 Days Discharge Instructions: Apply over primary dressing as directed. Com pression Wrap: FourPress (4 layer compression wrap) 1 x Per Week/7 Days Discharge Instructions: Apply four layer compression as directed. 1. We will use silver alginate ABDs and 4-layer compression bilaterally 2. I did not see any evidence of arterial insufficiency and she has tolerated Unna boots before. 3. I suspect these wounds will heal fairly easily but will have to transition her into some form of stocking. She tells [place] that she has an external compression stocking and will look at that next week if she brings it in 4. I suspect this patient has lymphedema I am not really sure I see a reason why. She does not have venous reflux based on her most recent study. There is no real evidence of edema in her thighs to suggest she has a more proximal venous obstruction. 5. I told her that she will need lifelong stockings I spent 35 minutes in review of this patient's past medical history, face-to-face evaluation and preparation of this record Electronic Signature(s) Signed: 12/31/2020 4:22:00 PM By: [name] Najjar MD Entered By: [name] on 12/31/2020 15:16:45 -------------------------------------------------------------------------------- HxROS Details Patient Name: Date of Service: [name], [name] 12/31/2020 1:15 PM Medical Record Number: [id number] Patient Account Number: [id number] Date of Birth/Sex: Treating RN: May 07, 1946 (75 y.o. Ardis Rowan, Lauren Primary Care Provider: Mayer Masker Other Clinician: Referring Provider: Treating Provider/Extender: [name] in Treatment: 0 Information Obtained From Patient Constitutional Symptoms (General Health) Complaints and Symptoms: Negative for: Fatigue; Fever; Chills;  Marked Weight Change Medical History: Past Medical History Notes: obesity Eyes Complaints and Symptoms: Positive for: Glasses / Contacts - glasses Negative for: Dry Eyes; Vision Changes Medical History: Positive for: Cataracts - mild Negative for: Glaucoma; Optic Neuritis Ear/Nose/Mouth/Throat Complaints and Symptoms: Negative for: Chronic sinus problems or rhinitis Medical History: Negative for: Chronic sinus problems/congestion; Middle ear problems Past Medical History Notes: seasonal allergies Respiratory Complaints and Symptoms: Negative for: Chronic or frequent coughs; Shortness of Breath Medical History: Negative for: Aspiration; Asthma; Chronic Obstructive Pulmonary Disease (COPD); Pneumothorax; Sleep Apnea; Tuberculosis Cardiovascular Complaints and Symptoms: Negative for: Chest pain Medical History: Positive for: Congestive Heart Failure; Hypertension; Peripheral Venous Disease Negative for: Angina; Arrhythmia; Coronary Artery Disease; Deep Vein Thrombosis; Hypotension; Myocardial Infarction; Peripheral Arterial Disease; Phlebitis; Vasculitis  Past Medical History Notes: aortic murmur, hyperlipidemia, hypertriglyceridemia Gastrointestinal Complaints and Symptoms: Negative for: Frequent diarrhea; Nausea; Vomiting Medical History: Negative for: Cirrhosis ; Colitis; Crohns; Hepatitis A; Hepatitis B; Hepatitis C Past Medical History Notes: UGI bleed, Endocrine Complaints and Symptoms: Negative for: Heat/cold intolerance Medical History: Negative for: Type I Diabetes; Type II Diabetes Past Medical History Notes: hypothyroidism Genitourinary Complaints and Symptoms: Negative for: Frequent urination Medical History: Negative for: End Stage Renal Disease Integumentary (Skin) Complaints and Symptoms: Positive for: Wounds Medical History: Negative for: History of Burn Musculoskeletal Complaints and Symptoms: Negative for: Muscle Pain; Muscle Weakness Medical  History: Positive for: Osteoarthritis Negative for: Gout; Rheumatoid Arthritis; Osteomyelitis Neurologic Complaints and Symptoms: Negative for: Numbness/parasthesias Medical History: Positive for: Neuropathy Negative for: Dementia; Quadriplegia; Paraplegia; Seizure Disorder Psychiatric Complaints and Symptoms: Negative for: Claustrophobia; Suicidal Medical History: Positive for: Confinement Anxiety Negative for: Anorexia/bulimia Hematologic/Lymphatic Medical History: Positive for: Anemia - iron deficiency; Lymphedema Negative for: Hemophilia; Human Immunodeficiency Virus; Sickle Cell Disease Immunological Medical History: Negative for: Lupus Erythematosus; Raynauds; Scleroderma Oncologic Medical History: Negative for: Received Chemotherapy; Received Radiation HBO Extended History Items Eyes: Cataracts Immunizations Pneumococcal Vaccine: Received Pneumococcal Vaccination: Yes Implantable Devices No devices added Hospitalization / Surgery History Type of Hospitalization/Surgery right arm surgery gastric bypass hysterectomy tonsillectomy hernia repair cervical myelopathy with spinal cord compression Family and Social History Cancer: Yes - Mother,Father; Diabetes: Yes - Father; Heart Disease: Yes - Father; Hereditary Spherocytosis: No; Hypertension: Yes - Father; Kidney Disease: No; Lung Disease: No; Seizures: No; Stroke: Yes - Mother; Thyroid Problems: No; Tuberculosis: No; Never smoker; Marital Status - Married; Alcohol Use: Daily - 1.5 bottles of wine daily; Drug Use: No History; Caffeine Use: Daily - coffee; Financial Concerns: No; Food, Clothing or Shelter Needs: No; Support System Lacking: No; Transportation Concerns: No Electronic Signature(s) Signed: 12/31/2020 4:22:00 PM By: [name] Najjar MD Signed: 01/03/2021 9:09:54 AM By: Fonnie Mu RN Signed: 01/03/2021 9:09:54 AM By: Fonnie Mu RN Entered By: Fonnie Mu on 12/31/2020  13:43:20 -------------------------------------------------------------------------------- SuperBill Details Patient Name: Date of Service: [name], [name] 12/31/2020 Medical Record Number: [id number] Patient Account Number: [id number] Date of Birth/Sex: Treating RN: 1946/04/22 (75 y.o. [name] Primary Care Provider: Mayer Masker Other Clinician: Referring Provider: Treating Provider/Extender: [name] in Treatment: 0 Diagnosis Coding ICD-10 Codes Code Description [id number] Non-pressure chronic ulcer of other part of right lower leg with fat layer exposed L97.822 Non-pressure chronic ulcer of other part of left lower leg with fat layer exposed I89.0 Lymphedema, not elsewhere classified Facility Procedures CPT4: Code 95284132 [id number] Description: 13 - WOUND CARE VISIT-LEV 3 EST PT Modifier: 25 Quantity: 1 CPT4: [id number] Description: 81 BILATERAL: Application of multi-layer venous compression system; leg (below knee), including ankle and t. Modifier: Quantity: 1 Physician Procedures : CPT4 Code Description Modifier [id number] - WC PHYS LEVEL 4 - EST PT ICD-10 Diagnosis Description L97.812 Non-pressure chronic ulcer of other part of right lower leg with fat layer exposed L97.822 Non-pressure chronic ulcer of other part of left  lower leg with fat layer exposed I89.0 Lymphedema, not elsewhere classified Quantity: 1 Electronic Signature(s) Signed: 12/31/2020 4:22:00 PM By: [name] Najjar MD Entered By: [name] on 12/31/2020 15:17:15

## 2021-01-03 NOTE — Progress Notes (Signed)
[name], [name] ([id number]) Visit Report for 12/31/2020 Abuse/Suicide Risk Screen Details Patient Name: Date of Service: [name], [name] 12/31/2020 1:15 PM Medical Record Number: [id number] Patient Account Number: [id number] Date of Birth/Sex: Treating RN: 1946/08/24 (75 y.o. [name], [name] Primary Care Sharran Caratachea: Mayer Masker Other Clinician: Referring Paislea Hatton: Treating Liam Bossman/Extender: Manning Charity in Treatment: 0 Abuse/Suicide Risk Screen Items Answer ABUSE RISK SCREEN: Has anyone close to you tried to hurt or harm you recentlyo No Do you feel uncomfortable with anyone in your familyo No Has anyone forced you do things that you didnt want to doo No Electronic Signature(s) Signed: 01/03/2021 9:09:54 AM By: Fonnie Mu RN Entered By: Fonnie Mu on 12/31/2020 13:43:35 -------------------------------------------------------------------------------- Activities of Daily Living Details Patient Name: Date of Service: [name], [name] 12/31/2020 1:15 PM Medical Record Number: [id number] Patient Account Number: [id number] Date of Birth/Sex: Treating RN: 1946/07/12 (75 y.o. [name], [name] Primary Care Jermiah Soderman: Mayer Masker Other Clinician: Referring Elmira Olkowski: Treating Saidee Geremia/Extender: Manning Charity in Treatment: 0 Activities of Daily Living Items Answer Activities of Daily Living (Please select one for each item) Drive Automobile Need Assistance T Medications ake Completely Able Use T elephone Completely Able Care for Appearance Need Assistance Use T oilet Completely Able Bath / Shower Completely Able Dress Self Completely Able Feed Self Completely Able Walk Need Assistance Get In / Out Bed Completely Able Housework Need Assistance Prepare Meals Need Assistance Handle Money Need Assistance Shop for Self Need Assistance Electronic Signature(s) Signed: 01/03/2021 9:09:54 AM By: Fonnie Mu RN Entered By: Fonnie Mu on 12/31/2020 13:47:00 -------------------------------------------------------------------------------- Education Screening Details Patient Name: Date of Service: [name], [name] Bray H. 12/31/2020 1:15 PM Medical Record Number: [id number] Patient Account Number: [id number] Date of Birth/Sex: Treating RN: 1946-02-08 (75 y.o. [name], [name] Primary Care Oney Folz: Mayer Masker Other Clinician: Referring Matha Masse: Treating Jerrin Recore/Extender: Manning Charity in Treatment: 0 Primary Learner Assessed: Patient Learning Preferences/Education Level/Primary Language Learning Preference: Explanation, Demonstration, Printed Material Highest Education Level: College or Above Preferred Language: English Cognitive Barrier Language Barrier: No Translator Needed: No Memory Deficit: No Emotional Barrier: No Cultural/Religious Beliefs Affecting Medical Care: No Physical Barrier Impaired Vision: Yes Glasses Impaired Hearing: No Decreased Hand dexterity: No Knowledge/Comprehension Knowledge Level: High Comprehension Level: High Ability to understand written instructions: High Ability to understand verbal instructions: High Motivation Anxiety Level: Calm Cooperation: Cooperative Education Importance: Denies Need Interest in Health Problems: Asks Questions Perception: Coherent Willingness to Engage in Self-Management High Activities: Readiness to Engage in Self-Management High Activities: Electronic Signature(s) Signed: 01/03/2021 9:09:54 AM By: Fonnie Mu RN Entered By: Fonnie Mu on 12/31/2020 13:47:30 -------------------------------------------------------------------------------- Fall Risk Assessment Details Patient Name: Date of Service: [name], [name] 12/31/2020 1:15 PM Medical Record Number: [id number] Patient Account Number: [id number] Date of Birth/Sex: Treating RN: 01/14/1946 (74 y.o. [name], [name] Primary Care Jahmar Mckelvy: Mayer Masker Other Clinician: Referring Chyrel Taha: Treating Blonnie Maske/Extender: Manning Charity in Treatment: 0 Fall Risk Assessment Items Have you had 2 or more falls in the last 12 monthso 0 Yes Have you had any fall that resulted in injury in the last 12 monthso 0 Yes FALLS RISK SCREEN History of falling - immediate or within 3 months 0 No Secondary diagnosis (Do you have 2 or more medical diagnoseso) 15 Yes Ambulatory aid None/bed rest/wheelchair/nurse 0 No Crutches/cane/walker 15 Yes Furniture 0 No Intravenous therapy Access/Saline/Heparin Lock 0 No Gait/Transferring Normal/ bed rest/ wheelchair 0 No Weak (short steps with or  without shuffle, stooped but able to lift head while walking, may seek 10 Yes support from furniture) Impaired (short steps with shuffle, may have difficulty arising from chair, head down, impaired 0 No balance) Mental Status Oriented to own ability 0 Yes Electronic Signature(s) Signed: 01/03/2021 9:09:54 AM By: Fonnie Mu RN Entered By: Fonnie Mu on 12/31/2020 13:48:13 -------------------------------------------------------------------------------- Foot Assessment Details Patient Name: Date of Service: [name], [name] Bray H. 12/31/2020 1:15 PM Medical Record Number: [id number] Patient Account Number: [id number] Date of Birth/Sex: Treating RN: [birth year] (75 y.o. [name], [name] Primary Care Jodelle Fausto: Mayer Masker Other Clinician: Referring Celester Lech: Treating Juanell Saffo/Extender: Manning Charity in Treatment: 0 Foot Assessment Items Site Locations + = Sensation present, - = Sensation absent, C = Callus, U = Ulcer R = Redness, W = Warmth, M = Maceration, PU = Pre-ulcerative lesion F = Fissure, S = Swelling, D = Dryness Assessment Right: Left: Other Deformity: No No Prior Foot Ulcer: No No Prior Amputation: No No Charcot Joint: No  No Ambulatory Status: Ambulatory With Help Assistance Device: Walker Gait: Steady Electronic Signature(s) Signed: 01/03/2021 9:09:54 AM By: Fonnie Mu RN Entered By: Fonnie Mu on 12/31/2020 13:59:30 -------------------------------------------------------------------------------- Nutrition Risk Screening Details Patient Name: Date of Service: [name], [name] 12/31/2020 1:15 PM Medical Record Number: [id number] Patient Account Number: [id number] Date of Birth/Sex: Treating RN: 07-04-1946 (75 y.o. [name], [name] Primary Care Danijela Vessey: Mayer Masker Other Clinician: Referring Benicio Manna: Treating Raynelle Fujikawa/Extender: Manning Charity in Treatment: 0 Height (in): 65 Weight (lbs): 200 Body Mass Index (BMI): 33.3 Nutrition Risk Screening Items Score Screening NUTRITION RISK SCREEN: I have an illness or condition that made me change the kind and/or amount of food I eat 0 No I eat fewer than two meals per day 0 No I eat few fruits and vegetables, or milk products 0 No I have three or more drinks of beer, liquor or wine almost every day 0 No I have tooth or mouth problems that make it hard for me to eat 0 No I don't always have enough money to buy the food I need 0 No I eat alone most of the time 0 No I take three or more different prescribed or over-the-counter drugs a day 0 No Without wanting to, I have lost or gained 10 pounds in the last six months 0 No I am not always physically able to shop, cook and/or feed myself 0 No Nutrition Protocols Good Risk Protocol 0 No interventions needed Moderate Risk Protocol High Risk Proctocol Risk Level: Good Risk Score: 0 Electronic Signature(s) Signed: 01/03/2021 9:09:54 AM By: Fonnie Mu RN Entered By: Fonnie Mu on 12/31/2020 13:48:20

## 2021-01-03 NOTE — Progress Notes (Addendum)
[name], [name] ([id number]) Visit Report for 12/31/2020 Allergy List Details Patient Name: Date of Service: [name], [name] 12/31/2020 1:15 PM Medical Record Number: [id number] Patient Account Number: [id number] Date of Birth/Sex: Treating RN: 1946/05/12 (75 y.o. [name] Primary Care [name]: [name] Other Clinician: Referring [name]: Treating [name]/Extender: [name] in Treatment: 0 Allergies Active Allergies cocoa Reaction: anaphylaxis Severity: Severe Type: Food red dye Reaction: hives Severity: Severe Type: Food Lyrica Reaction: nose bleeds Severity: Severe Type: Medication Sulfa (Sulfonamide Antibiotics) Reaction: rash Severity: Moderate Type: Allergen Allergy Notes Electronic Signature(s) Signed: 01/03/2021 9:09:54 AM By: [name] Mu RN Entered By: [name] on 12/31/2020 13:31:01 -------------------------------------------------------------------------------- Arrival Information Details Patient Name: Date of Service: [name] Haddock, Kinslie H. 12/31/2020 1:15 PM Medical Record Number: [id number] Patient Account Number: [id number] Date of Birth/Sex: Treating RN: 12/15/1945 (75 y.o. [name] Primary Care Cameshia Cressman: [name] Other Clinician: Referring Chan Sheahan: Treating [name]/Extender: [name] in Treatment: 0 Visit Information Patient Arrived: [name] Time: 13:20 Accompanied By: self Transfer Assistance: None Patient Identification Verified: Yes Secondary Verification Process Completed: Yes Patient Requires Transmission-Based Precautions: No Patient Has Alerts: No History Since Last Visit Added or deleted any medications: No Any new allergies or adverse reactions: No Had a fall or experienced change in activities of daily living that may affect risk of falls: No Signs or symptoms of abuse/neglect since last visito No Hospitalized  since last visit: No Implantable device outside of the clinic excluding Implantable device outside of the clinic excluding cellular tissue based products placed in the [name] since last visit: No No Pain Present Now: Yes Electronic Signature(s) Signed: 01/03/2021 9:09:54 AM By: [name] Mu RN Entered By: [name] on 12/31/2020 13:29:21 -------------------------------------------------------------------------------- Clinic Level of Care Assessment Details Patient Name: Date of Service: [name], [name] 12/31/2020 1:15 PM Medical Record Number: [id number] Patient Account Number: [id number] Date of Birth/Sex: Treating RN: 10/06/1946 (75 y.o. [name] Primary Care [name]: [name] Other Clinician: Referring [name]: Treating [name]/Extender: [name] in Treatment: 0 Clinic Level of Care Assessment Items TOOL 1 Quantity Score []  - 0 Use when EandM and Procedure is performed on INITIAL visit ASSESSMENTS - Nursing Assessment / Reassessment X- 1 20 General Physical Exam (combine w/ comprehensive assessment (listed just below) when performed on new pt. evals) X- 1 25 Comprehensive Assessment (HX, ROS, Risk Assessments, Wounds Hx, etc.) ASSESSMENTS - Wound and Skin Assessment / Reassessment []  - 0 Dermatologic / Skin Assessment (not related to wound area) ASSESSMENTS - Ostomy and/or Continence Assessment and Care []  - 0 Incontinence Assessment and Management []  - 0 Ostomy Care Assessment and Management (repouching, etc.) PROCESS - Coordination of Care X - Simple Patient / Family Education for ongoing care 1 15 []  - 0 Complex (extensive) Patient / Family Education for ongoing care X- 1 10 Staff obtains Chiropractor, Records, T Results / Process Orders est []  - 0 Staff telephones HHA, Nursing Homes / Clarify orders / etc []  - 0 Routine Transfer to another Facility (non-emergent condition) []  - 0 Routine Hospital Admission  (non-emergent condition) X- 1 15 New Admissions / Manufacturing engineer / Ordering NPWT Apligraf, etc. , []  - 0 Emergency Hospital Admission (emergent condition) PROCESS - Special Needs []  - 0 Pediatric / Minor Patient Management []  - 0 Isolation Patient Management []  - 0 Hearing / Language / Visual special needs []  - 0 Assessment of Community assistance (transportation, D/C planning, etc.) []  - 0 Additional  assistance / Altered mentation []  - 0 Support Surface(s) Assessment (bed, cushion, seat, etc.) INTERVENTIONS - Miscellaneous []  - 0 External ear exam []  - 0 Patient Transfer (multiple staff / / Similar devices) []  - 0 Simple Staple / Suture removal (25 or less) []  - 0 Complex Staple / Suture removal (26 or more) []  - 0 Hypo/Hyperglycemic Management (do not check if billed separately) X- 1 15 Ankle / Brachial Index (ABI) - do not check if billed separately Has the patient been seen at the hospital within the last three years: Yes Total Score: 100 Level Of Care: New/Established - Level 3 Electronic Signature(s) Signed: 12/31/2020 4:47:51 PM By: RN, BSN Entered By: Nurse, adult on 12/31/2020 14:44:52 -------------------------------------------------------------------------------- Compression Therapy Details Patient Name: Date of Service: , H. 12/31/2020 1:15 PM Medical Record Number: Zenaida Deed Patient Account Number: Zenaida Deed Date of Birth/Sex: Treating RN: 16-Aug-1946 (75 y.o. [name] Primary Care Mckenzi Buonomo: 02/28/2021 Other Clinician: Referring Darol Cush: Treating Lissie Hinesley/Extender: [id number] in Treatment: 0 Compression Therapy Performed for Wound Assessment: Wound #4 Left,Lateral Lower Leg Performed By: Clinician [id number], RN Compression Type: Four Layer Post Procedure Diagnosis Same as Pre-procedure Electronic Signature(s) Signed: 12/31/2020 4:47:51 PM By: 66 RN, BSN Entered By: [name] on 12/31/2020 14:45:24 -------------------------------------------------------------------------------- Compression Therapy Details Patient Name: Date of Service: [name], [name] 12/31/2020 1:15 PM Medical Record Number: Zenaida Deed Patient Account Number: Zenaida Deed Date of Birth/Sex: Treating RN: Sep 28, 1946 (75 y.o. [name] Primary Care Claris Guymon: 02/28/2021 Other Clinician: Referring Sequoyah Counterman: Treating Virgia Kelner/Extender: [id number] in Treatment: 0 Compression Therapy Performed for Wound Assessment: Wound #5 Right,Anterior Lower Leg Performed By: Clinician [id number], RN Compression Type: Four Layer Post Procedure Diagnosis Same as Pre-procedure Electronic Signature(s) Signed: 12/31/2020 4:47:51 PM By: 66 RN, BSN Entered By: [name] on 12/31/2020 14:45:24 -------------------------------------------------------------------------------- Encounter Discharge Information Details Patient Name: Date of Service: [name], [name] Stall H. 12/31/2020 1:15 PM Medical Record Number: Zenaida Deed Patient Account Number: Zenaida Deed Date of Birth/Sex: Treating RN: 1945-12-18 (75 y.o. [name] Primary Care Robinn Overholt: 02/28/2021 Other Clinician: Referring Geza Beranek: Treating Danzel Marszalek/Extender: [id number] in Treatment: 0 Encounter Discharge Information Items Discharge Condition: Stable Ambulatory Status: Walker Discharge Destination: Home Transportation: Private Auto Accompanied By: self Schedule Follow-up Appointment: Yes Clinical Summary of Care: Electronic Signature(s) Signed: 12/31/2020 4:56:10 PM By: 10/21/1946 Entered By: 66 on 12/31/2020 16:50:17 -------------------------------------------------------------------------------- Lower Extremity Assessment Details Patient Name: Date of Service: [name], [name] 12/31/2020 1:15  PM Medical Record Number: 02/28/2021 Patient Account Number: [name] Stall Date of Birth/Sex: Treating RN: Oct 04, 1946 (75 y.o. [name], [name] Primary Care Nesha Counihan: 02/28/2021 Other Clinician: Referring Pasqualina Colasurdo: Treating Reon Hunley/Extender: [id number] in Treatment: 0 Edema Assessment Assessed: [id number]: Yes] 10/21/1946: Yes] Edema: [Left: Yes] [Right: Yes] Calf Left: Right: Point of Measurement: 34 cm From Medial Instep 39 cm 36 cm Ankle Left: Right: Point of Measurement: 10 cm From Medial Instep 22 cm 22.54 cm Knee To Floor Left: Right: From Medial Instep 41 cm 41 cm Vascular Assessment Pulses: Dorsalis Pedis Palpable: [Left:Yes] [Right:Yes] Posterior Tibial Palpable: [Left:Yes] [Right:Yes] Blood Pressure: Brachial: [Left:144] [Right:157] Ankle: [Left:Dorsalis Pedis: 180] [Right:Dorsalis Pedis: 190] Ankle Brachial Index: [Left:1.15] [Right:1.21] Electronic Signature(s) Signed: 01/03/2021 9:09:54 AM By: [name] Rowan RN Entered By: [name] on 12/31/2020 14:15:16 -------------------------------------------------------------------------------- Multi Wound Chart Details Patient Name: Date of Service: [name], [name] 12/31/2020 1:15 PM Medical Record Number:  [id number] Patient Account Number: [id number] Date of Birth/Sex: Treating RN: 1946-04-26 (75 y.o. [name] Primary Care Anhelica Fowers: [name] Other Clinician: Referring Jowana Thumma: Treating Desirie Minteer/Extender: [name] in Treatment: 0 Vital Signs Height(in): 65 Pulse(bpm): 74 Weight(lbs): 200 Blood Pressure(mmHg): 157/74 Body Mass Index(BMI): 33 Temperature(F): 98 Respiratory Rate(breaths/min): 18 Photos: [4:No Photos Left, Lateral Lower Leg] [5:No Photos Right, Anterior Lower Leg] [N/A:N/A N/A] Wound Location: [4:Gradually Appeared] [5:Gradually Appeared] [N/A:N/A] Wounding Event: [4:Venous Leg Ulcer] [5:Venous Leg Ulcer]  [N/A:N/A] Primary Etiology: [4:Cataracts, Anemia, Lymphedema,] [5:Cataracts, Anemia, Lymphedema,] [N/A:N/A] Comorbid History: [4:Congestive Heart Failure, Hypertension, Peripheral Venous Disease, Osteoarthritis, Neuropathy, Confinement Anxiety 08/27/2020] [5:Congestive Heart Failure, Hypertension, Peripheral Venous Disease, Osteoarthritis, Neuropathy,  Confinement Anxiety 11/30/2020] [N/A:N/A] Date Acquired: [4:0] [5:0] [N/A:N/A] Weeks of Treatment: [4:Open] [5:Open] [N/A:N/A] Wound Status: [4:1.2x1.2x0.1] [5:1.2x0.6x0.1] [N/A:N/A] Measurements L x W x D (cm) [4:1.131] [5:0.565] [N/A:N/A] A (cm) : rea [4:0.113] [5:0.057] [N/A:N/A] Volume (cm) : [4:Full Thickness Without Exposed] [5:Full Thickness Without Exposed] [N/A:N/A] Classification: [4:Support Structures Medium] [5:Support Structures Medium] [N/A:N/A] Exudate Amount: [4:Serosanguineous] [5:Serosanguineous] [N/A:N/A] Exudate Type: [4:red, brown] [5:red, brown] [N/A:N/A] Exudate Color: [4:Distinct, outline attached] [5:Distinct, outline attached] [N/A:N/A] Wound Margin: [4:Large (67-100%)] [5:Large (67-100%)] [N/A:N/A] Granulation Amount: [4:Red, Pink] [5:Red, Pink] [N/A:N/A] Granulation Quality: [4:Small (1-33%)] [5:None Present (0%)] [N/A:N/A] Necrotic Amount: [4:Fascia: No] [5:Fascia: No] [N/A:N/A] Exposed Structures: [4:Fat Layer (Subcutaneous Tissue): No Tendon: No Muscle: No Joint: No Bone: No Small (1-33%)] [5:Fat Layer (Subcutaneous Tissue): No Tendon: No Muscle: No Joint: No Bone: No Small (1-33%)] [N/A:N/A] Epithelialization: [4:Compression Therapy] [5:Compression Therapy] [N/A:N/A] Treatment Notes Electronic Signature(s) Signed: 12/31/2020 4:22:00 PM By: Baltazar Najjar MD Signed: 12/31/2020 4:47:51 PM By: Zenaida Deed RN, BSN Entered By: Baltazar Najjar on 12/31/2020 15:07:28 -------------------------------------------------------------------------------- Multi-Disciplinary Care Plan Details Patient Name: Date of  Service: [facility] [place] H. 12/31/2020 1:15 PM Medical Record Number: [id number] Patient Account Number: [id number] Date of Birth/Sex: Treating RN: [birth year] (75 y.o. [name] Primary Care Danniel Tones: [name] Other Clinician: Referring Sally Reimers: Treating Tagg Eustice/Extender: [name] in Treatment: 0 Active Inactive Abuse / Safety / Falls / Self Care Management Nursing Diagnoses: Potential for falls Goals: Patient/caregiver will verbalize/demonstrate measures taken to prevent injury and/or falls Date Initiated: 12/31/2020 Target Resolution Date: 01/28/2021 Goal Status: Active Interventions: Assess fall risk on admission and as needed Notes: Venous Leg Ulcer Nursing Diagnoses: Knowledge deficit related to disease process and management Goals: Patient will maintain optimal edema control Date Initiated: 12/31/2020 Target Resolution Date: 01/28/2021 Goal Status: Active Interventions: Assess peripheral edema status every visit. Compression as ordered Provide education on venous insufficiency Treatment Activities: Therapeutic compression applied : 12/31/2020 Notes: Wound/Skin Impairment Nursing Diagnoses: Impaired tissue integrity Knowledge deficit related to ulceration/compromised skin integrity Goals: Patient/caregiver will verbalize understanding of skin care regimen Date Initiated: 12/31/2020 Target Resolution Date: 01/28/2021 Goal Status: Active Ulcer/skin breakdown will have a volume reduction of 30% by week 4 Date Initiated: 12/31/2020 Target Resolution Date: 01/28/2021 Goal Status: Active Interventions: Assess patient/caregiver ability to obtain necessary supplies Assess patient/caregiver ability to perform ulcer/skin care regimen upon admission and as needed Assess ulceration(s) every visit Provide education on ulcer and skin care Treatment Activities: Skin care regimen initiated : 12/31/2020 Topical wound management initiated :  12/31/2020 Notes: Electronic Signature(s) Signed: 12/31/2020 4:47:51 PM By: Zenaida Deed RN, BSN Entered By: Zenaida Deed on 12/31/2020 14:38:13 -------------------------------------------------------------------------------- Pain Assessment Details Patient Name: Date of Service: [name] Haddock, [name] H. 12/31/2020 1:15 PM Medical Record Number: [id number] Patient Account Number: [id number] Date of Birth/Sex: Treating RN:  Sep 14, 1946 (75 y.o. [name] RowanF) Breedlove, [name] Primary Care Clavin Ruhlman: [name] MaskerAbonza, [name] Other Clinician: Referring Aleksa Collinsworth: Treating Rivan Siordia/Extender: [name] Charityobson, Michael [name], [name] Weeks in Treatment: 0 Active Problems Location of Pain Severity and Description of Pain Patient Has Paino Yes Site Locations Pain Location: Pain in Ulcers With Dressing Change: Yes Duration of the Pain. Constant / Intermittento Constant Rate the pain. Current Pain Level: 3 Worst Pain Level: 7 Least Pain Level: 0 Tolerable Pain Level: 7 Character of Pain Describe the Pain: Aching Pain Management and Medication Current Pain Management: Medication: Yes Cold Application: No Rest: Yes Massage: No Activity: No T.E.N.S.: No Heat Application: No Leg drop or elevation: No Is the Current Pain Management Adequate: Adequate How does your wound impact your activities of daily livingo Sleep: No Bathing: No Appetite: No Relationship With Others: No Bladder Continence: No Emotions: No Bowel Continence: No Work: No Toileting: No Drive: No Dressing: No Hobbies: No Electronic Signature(s) Signed: 01/03/2021 9:09:54 AM By: [name] MuBreedlove, Lauren RN Entered By: [name] MuBreedlove, [name] on 12/31/2020 13:50:05 -------------------------------------------------------------------------------- Patient/Caregiver Education Details Patient Name: Date of Service: [name], Randall AnLYNDA H. 2/4/2022andnbsp1:15 PM Medical Record Number: [id number] Patient Account Number: [id number] Date of Birth/Gender: Treating  RN: Sep 14, 1946 (75 y.o. [name] StandardF) Boehlein, Linda Primary Care Physician: [name] MaskerAbonza, [name] Other Clinician: Referring Physician: Treating Physician/Extender: [name] Charityobson, Michael [name], [name] Weeks in Treatment: 0 Education Assessment Education Provided To: Patient Education Topics Provided Venous: Handouts: Controlling Swelling with Multilayered Compression Wraps Methods: Explain/Verbal, Printed Responses: Reinforcements needed, State content correctly Welcome T [name]: o Handouts: Welcome T [name] o Methods: Explain/Verbal, Printed Electronic Signature(s) Signed: 12/31/2020 4:47:51 PM By: Zenaida DeedBoehlein, Linda RN, BSN Entered By: Zenaida DeedBoehlein, Linda on 12/31/2020 14:38:55 -------------------------------------------------------------------------------- Wound Assessment Details Patient Name: Date of Service: [name] LudwigMCCRA [name], [name] 12/31/2020 1:15 PM Medical Record Number: [id number] Patient Account Number: [id number] Date of Birth/Sex: Treating RN: Sep 14, 1946 (75 y.o. [name] RowanF) Breedlove, [name] Primary Care Reygan Heagle: [name] MaskerAbonza, [name] Other Clinician: Referring Natasa Stigall: Treating Ohanna Gassert/Extender: [name] Charityobson, Michael [name], [name] Weeks in Treatment: 0 Wound Status Wound Number: 4 Primary Venous Leg Ulcer Etiology: Wound Location: Left, Lateral Lower Leg Wound Open Wounding Event: Gradually Appeared Status: Date Acquired: 08/27/2020 Comorbid Cataracts, Anemia, Lymphedema, Congestive Heart Failure, Weeks Of Treatment: 0 History: Hypertension, Peripheral Venous Disease, Osteoarthritis, Clustered Wound: No Neuropathy, Confinement Anxiety Photos Wound Measurements Length: (cm) 1.2 Width: (cm) 1.2 Depth: (cm) 0.1 Area: (cm) 1.131 Volume: (cm) 0.113 % Reduction in Area: 0% % Reduction in Volume: 0% Epithelialization: Small (1-33%) Tunneling: No Undermining: No Wound Description Classification: Full Thickness Without Exposed Support Structures Wound Margin:  Distinct, outline attached Exudate Amount: Medium Exudate Type: Serosanguineous Exudate Color: red, brown Foul Odor After Cleansing: No Slough/Fibrino Yes Wound Bed Granulation Amount: Large (67-100%) Exposed Structure Granulation Quality: Red, Pink Fascia Exposed: No Necrotic Amount: Small (1-33%) Fat Layer (Subcutaneous Tissue) Exposed: No Necrotic Quality: Adherent Slough Tendon Exposed: No Muscle Exposed: No Joint Exposed: No Bone Exposed: No Electronic Signature(s) Signed: 01/10/2021 2:23:11 PM By: Benjaman KindlerJones, Dedrick EMT/HBOT/SD Signed: 01/10/2021 5:11:38 PM By: [name] MuBreedlove, Lauren RN Previous Signature: 01/03/2021 9:09:54 AM Version By: [name] MuBreedlove, Lauren RN Entered By: Benjaman KindlerJones, Dedrick on 01/07/2021 14:39:47 -------------------------------------------------------------------------------- Wound Assessment Details Patient Name: Date of Service: [name], [name] BrayLYNDA H. 12/31/2020 1:15 PM Medical Record Number: [id number] Patient Account Number: [id number] Date of Birth/Sex: Treating RN: Sep 14, 1946 (75 y.o. Toniann FailF) Breedlove, [name] Primary Care Tajh Livsey: [name] MaskerAbonza, [name] Other Clinician: Referring Colbe Viviano: Treating Cyera Balboni/Extender: [name] Charityobson, Michael [name], [name] Weeks in Treatment: 0 Wound Status Wound Number: 5 Primary Venous Leg Ulcer Etiology:  Wound Location: Right, Anterior Lower Leg Wound Open Wounding Event: Gradually Appeared Status: Date Acquired: 11/30/2020 Comorbid Cataracts, Anemia, Lymphedema, Congestive Heart Failure, Weeks Of Treatment: 0 History: Hypertension, Peripheral Venous Disease, Osteoarthritis, Clustered Wound: No Neuropathy, Confinement Anxiety Photos Wound Measurements Length: (cm) 1.2 Width: (cm) 0.6 Depth: (cm) 0.1 Area: (cm) 0.565 Volume: (cm) 0.057 % Reduction in Area: 0% % Reduction in Volume: 0% Epithelialization: Small (1-33%) Tunneling: No Undermining: No Wound Description Classification: Full Thickness Without Exposed Support  Structures Wound Margin: Distinct, outline attached Exudate Amount: Medium Exudate Type: Serosanguineous Exudate Color: red, brown Foul Odor After Cleansing: No Slough/Fibrino No Wound Bed Granulation Amount: Large (67-100%) Exposed Structure Granulation Quality: Red, Pink Fascia Exposed: No Necrotic Amount: None Present (0%) Fat Layer (Subcutaneous Tissue) Exposed: No Tendon Exposed: No Muscle Exposed: No Joint Exposed: No Bone Exposed: No Electronic Signature(s) Signed: 01/10/2021 2:23:11 PM By: Benjaman Kindler EMT/HBOT/SD Signed: 01/10/2021 5:11:38 PM By: [name] Mu RN Previous Signature: 01/03/2021 9:09:54 AM Version By: [name] Mu RN Entered By: Benjaman Kindler on 01/07/2021 14:39:21 -------------------------------------------------------------------------------- Vitals Details Patient Name: Date of Service: [name], [name] H. 12/31/2020 1:15 PM Medical Record Number: [id number] Patient Account Number: [id number] Date of Birth/Sex: Treating RN: 21-Jan-1946 (75 y.o. [name] Primary Care Theophile Harvie: [name] Other Clinician: Referring Livvy Spilman: Treating Aidon Klemens/Extender: [name] in Treatment: 0 Vital Signs Time Taken: 13:29 Temperature (F): 98 Height (in): 65 Pulse (bpm): 74 Source: Stated Respiratory Rate (breaths/min): 18 Weight (lbs): 200 Blood Pressure (mmHg): 157/74 Source: Stated Reference Range: 80 - 120 mg / dl Body Mass Index (BMI): 33.3 Electronic Signature(s) Signed: 01/03/2021 9:09:54 AM By: [name] Mu RN Entered By: [name] on 12/31/2020 13:30:47

## 2021-01-05 ENCOUNTER — Other Ambulatory Visit: Payer: Self-pay | Admitting: Physician Assistant

## 2021-01-06 ENCOUNTER — Other Ambulatory Visit: Payer: Self-pay | Admitting: Physician Assistant

## 2021-01-06 DIAGNOSIS — E039 Hypothyroidism, unspecified: Secondary | ICD-10-CM

## 2021-01-07 ENCOUNTER — Encounter (HOSPITAL_BASED_OUTPATIENT_CLINIC_OR_DEPARTMENT_OTHER): Payer: PPO | Admitting: Internal Medicine

## 2021-01-07 ENCOUNTER — Other Ambulatory Visit: Payer: Self-pay

## 2021-01-07 DIAGNOSIS — L97822 Non-pressure chronic ulcer of other part of left lower leg with fat layer exposed: Secondary | ICD-10-CM | POA: Diagnosis not present

## 2021-01-07 DIAGNOSIS — L97812 Non-pressure chronic ulcer of other part of right lower leg with fat layer exposed: Secondary | ICD-10-CM | POA: Diagnosis not present

## 2021-01-07 NOTE — Progress Notes (Signed)
[name], [name] ([id number]) Visit Report for 01/07/2021 Arrival Information Details Patient Name: Date of Service: [name], [name] 01/07/2021 2:00 PM Medical Record Number: [id number] Patient Account Number: [id number] Date of Birth/Sex: Treating RN: 10-25-1946 (75 y.o. [name], [name] Primary Care Harvest Deist: Mayer Masker Other Clinician: Referring Destin Vinsant: Treating Reise Gladney/Extender: Manning Charity in Treatment: 1 Visit Information History Since Last Visit Added or deleted any medications: No Patient Arrived: Dan Humphreys Any new allergies or adverse reactions: No Arrival Time: 14:17 Had a fall or experienced change in No Accompanied By: self activities of daily living that may affect Transfer Assistance: None risk of falls: Patient Identification Verified: Yes Signs or symptoms of abuse/neglect since last visito No Secondary Verification Process Completed: Yes Hospitalized since last visit: No Patient Requires Transmission-Based Precautions: No Implantable device outside of the clinic excluding No Patient Has Alerts: No cellular tissue based products placed in the center since last visit: Has Dressing in Place as Prescribed: Yes Pain Present Now: No Electronic Signature(s) Signed: 01/07/2021 5:40:25 PM By: Fonnie Mu RN Entered By: Fonnie Mu on 01/07/2021 14:19:59 -------------------------------------------------------------------------------- Clinic Level of Care Assessment Details Patient Name: Date of Service: DEOLA, REWIS 01/07/2021 2:00 PM Medical Record Number: [id number] Patient Account Number: [id number] Date of Birth/Sex: Treating RN: 06/07/1946 (75 y.o. [name] Primary Care Mehdi Gironda: Mayer Masker Other Clinician: Referring Bird Tailor: Treating Jamye Balicki/Extender: Manning Charity in Treatment: 1 Clinic Level of Care Assessment Items TOOL 4 Quantity Score []  - 0 Use when  only [name] EandM is performed on FOLLOW-UP visit ASSESSMENTS - Nursing Assessment / Reassessment X- 1 10 Reassessment of Co-morbidities (includes updates in patient status) X- 1 5 Reassessment of Adherence to Treatment Plan ASSESSMENTS - Wound and Skin A ssessment / Reassessment []  - 0 Simple Wound Assessment / Reassessment - one wound X- 2 5 Complex Wound Assessment / Reassessment - multiple wounds []  - 0 Dermatologic / Skin Assessment (not related to wound area) ASSESSMENTS - Focused Assessment X- 2 5 Circumferential Edema Measurements - multi extremities []  - 0 Nutritional Assessment / Counseling / Intervention X- 1 5 Lower Extremity Assessment (monofilament, tuning fork, pulses) []  - 0 Peripheral Arterial Disease Assessment (using hand held doppler) ASSESSMENTS - Ostomy and/or Continence Assessment and Care []  - 0 Incontinence Assessment and Management []  - 0 Ostomy Care Assessment and Management (repouching, etc.) PROCESS - Coordination of Care X - Simple Patient / Family Education for ongoing care 1 15 []  - 0 Complex (extensive) Patient / Family Education for ongoing care X- 1 10 Staff obtains , Records, T Results / Process Orders est []  - 0 Staff telephones HHA, Nursing Homes / Clarify orders / etc []  - 0 Routine Transfer to another Facility (non-emergent condition) []  - 0 Routine Hospital Admission (non-emergent condition) []  - 0 New Admissions / / Ordering NPWT Apligraf, etc. , []  - 0 Emergency Hospital Admission (emergent condition) X- 1 10 Simple Discharge Coordination []  - 0 Complex (extensive) Discharge Coordination PROCESS - Special Needs []  - 0 Pediatric / Minor Patient Management []  - 0 Isolation Patient Management []  - 0 Hearing / Language / Visual special needs []  - 0 Assessment of Community assistance (transportation, D/C planning, etc.) []  - 0 Additional assistance / Altered mentation []  - 0 Support  Surface(s) Assessment (bed, cushion, seat, etc.) INTERVENTIONS - Wound Cleansing / Measurement []  - 0 Simple Wound Cleansing - one wound X- 2 5 Complex Wound Cleansing - multiple wounds X- 1 5  Wound Imaging (photographs - any number of wounds) []  - 0 Wound Tracing (instead of photographs) []  - 0 Simple Wound Measurement - one wound []  - 0 Complex Wound Measurement - multiple wounds INTERVENTIONS - Wound Dressings []  - 0 Small Wound Dressing one or multiple wounds []  - 0 Medium Wound Dressing one or multiple wounds []  - 0 Large Wound Dressing one or multiple wounds []  - 0 Application of Medications - topical []  - 0 Application of Medications - injection INTERVENTIONS - Miscellaneous []  - 0 External ear exam []  - 0 Specimen Collection (cultures, biopsies, blood, body fluids, etc.) []  - 0 Specimen(s) / Culture(s) sent or taken to Lab for analysis []  - 0 Patient Transfer (multiple staff / Lift / Similar devices) []  - 0 Simple Staple / Suture removal (25 or less) []  - 0 Complex Staple / Suture removal (26 or more) []  - 0 Hypo / Hyperglycemic Management (close monitor of Blood Glucose) []  - 0 Ankle / Brachial Index (ABI) - do not check if billed separately X- 1 5 Vital Signs Has the patient been seen at the hospital within the last three years: Yes Total Score: 95 Level Of Care: New/Established - Level 3 Electronic Signature(s) Signed: 01/07/2021 5:49:38 PM By: RN, BSN Entered By: on 01/07/2021 15:13:28 -------------------------------------------------------------------------------- Encounter Discharge Information Details Patient Name: Date of Service: , [name] 01/07/2021 2:00 PM Medical Record Number: Patient Account Number: Date of Birth/Sex: Treating RN: 08-05-1946 (75 y.o. Primary Care Basir Niven: Michiel Sites Other Clinician: Referring Desiderio Dolata: Treating Blakley Michna/Extender:  in Treatment: 1 Encounter Discharge Information Items Discharge Condition: Stable Ambulatory Status: Walker Discharge Destination: Home Transportation: Private Auto Accompanied By: self Schedule Follow-up Appointment: No Clinical Summary of Care: Notes patient and nurse applied compression stockings. Electronic Signature(s) Signed: 01/07/2021 5:46:28 PM By: Entered By: on 01/07/2021 15:47:58 -------------------------------------------------------------------------------- Lower Extremity Assessment Details Patient Name: Date of Service: KANIJA, REMMEL 01/07/2021 2:00 PM Medical Record Number: 03/07/2021 Patient Account Number: [name] Date of Birth/Sex: Treating RN: 10-24-1946 (75 y.o. [id number], [name] Primary Care Faviola Klare: 10/21/1946 Other Clinician: Referring Kemari Mares: Treating Neddie Steedman/Extender: 66 in Treatment: 1 Edema Assessment Assessed: Arta Silence: No] Mayer Masker: No] Edema: [Left: Yes] [Right: Yes] Calf Left: Right: Point of Measurement: 34 cm From Medial Instep 35 cm 34 cm Ankle Left: Right: Point of Measurement: 10 cm From Medial Instep 21 cm 21 cm Vascular Assessment Pulses: Dorsalis Pedis Palpable: [Left:Yes] [Right:Yes] Posterior Tibial Palpable: [Left:Yes] [Right:Yes] Electronic Signature(s) Signed: 01/07/2021 5:40:25 PM By: 03/07/2021 RN Entered By: Shawn Stall on 01/07/2021 14:32:48 -------------------------------------------------------------------------------- Multi Wound Chart Details Patient Name: Date of Service: 03/07/2021, [name] 01/07/2021 2:00 PM Medical Record Number: 03/07/2021 Patient Account Number: [id number] Date of Birth/Sex: Treating RN: 21-Mar-1946 (75 y.o. 66 Primary Care Sapir Lavey: [name] Other Clinician: Referring Marialice Newkirk: Treating Peirce Deveney/Extender: Mayer Masker in Treatment: 1 Vital Signs Height(in): 65 Pulse(bpm): 72 Weight(lbs): 200 Blood Pressure(mmHg): 145/77 Body Mass Index(BMI): 33 Temperature(F): 98.1 Respiratory Rate(breaths/min): 17 Photos: [4:No Photos Left, Lateral Lower Leg] [5:No Photos Right, Anterior Lower Leg] [N/A:N/A N/A] Wound Location: [4:Gradually Appeared] [5:Gradually Appeared] [N/A:N/A] Wounding Event: [4:Venous Leg Ulcer] [5:Venous Leg Ulcer] [N/A:N/A] Primary Etiology: [4:Cataracts, Anemia, Lymphedema,] [5:Cataracts, Anemia, Lymphedema,] [N/A:N/A] Comorbid History: [4:Congestive Heart Failure, Hypertension, Peripheral Venous Disease, Osteoarthritis, Neuropathy, Confinement Anxiety 08/27/2020] [5:Congestive Heart Failure, Hypertension, Peripheral Venous Disease, Osteoarthritis, Neuropathy,  Confinement  Anxiety 11/30/2020] [N/A:N/A] Date Acquired: [4:1] [5:1] [N/A:N/A] Weeks of Treatment: [4:Open] [5:Healed - Epithelialized] [N/A:N/A] Wound Status: [4:0x0x0] [5:0x0x0] [N/A:N/A] Measurements L x W x D (cm) [4:0] [5:0] [N/A:N/A] A (cm) : rea [4:0] [5:0] [N/A:N/A] Volume (cm) : [4:100.00%] [5:100.00%] [N/A:N/A] % Reduction in Area: [4:100.00%] [5:100.00%] [N/A:N/A] % Reduction in Volume: [4:Full Thickness Without Exposed] [5:Full Thickness Without Exposed] [N/A:N/A] Classification: [4:Support Structures Medium] [5:Support Structures Medium] [N/A:N/A] Exudate Amount: [4:Serosanguineous] [5:Serosanguineous] [N/A:N/A] Exudate Type: [4:red, brown] [5:red, brown] [N/A:N/A] Exudate Color: [4:Distinct, outline attached] [5:Distinct, outline attached] [N/A:N/A] Wound Margin: [4:Large (67-100%)] [5:Large (67-100%)] [N/A:N/A] Granulation Amount: [4:Red, Pink] [5:Red, Pink] [N/A:N/A] Granulation Quality: [4:None Present (0%)] [5:None Present (0%)] [N/A:N/A] Necrotic Amount: [4:Fascia: No] [5:Fascia: No] [N/A:N/A] Exposed Structures: [4:Fat Layer (Subcutaneous Tissue): No Tendon: No Muscle: No Joint: No Bone: No  Large (67-100%)] [5:Fat Layer (Subcutaneous Tissue): No Tendon: No Muscle: No Joint: No Bone: No Large (67-100%)] [N/A:N/A] Treatment Notes Electronic Signature(s) Signed: 01/07/2021 5:36:32 PM By: Baltazar Najjar MD Signed: 01/07/2021 5:49:38 PM By: Zenaida Deed RN, BSN Entered By: Baltazar Najjar on 01/07/2021 15:18:14 -------------------------------------------------------------------------------- Multi-Disciplinary Care Plan Details Patient Name: Date of Service: [facility], [name] 01/07/2021 2:00 PM Medical Record Number: [id number] Patient Account Number: [id number] Date of Birth/Sex: Treating RN: 1946-06-09 (75 y.o. [name] Primary Care Marykay Mccleod: Mayer Masker Other Clinician: Referring Aleksandar Duve: Treating Kunal Levario/Extender: Manning Charity in Treatment: 1 Active Inactive Electronic Signature(s) Signed: 01/07/2021 5:49:38 PM By: Zenaida Deed RN, BSN Entered By: Zenaida Deed on 01/07/2021 15:11:59 -------------------------------------------------------------------------------- Pain Assessment Details Patient Name: Date of Service: [name], [name] 01/07/2021 2:00 PM Medical Record Number: [id number] Patient Account Number: [id number] Date of Birth/Sex: Treating RN: 23-May-1946 (74 y.o. [name], [name] Primary Care Cullin Dishman: Mayer Masker Other Clinician: Referring Shivonne Schwartzman: Treating Renell Coaxum/Extender: Manning Charity in Treatment: 1 Active Problems Location of Pain Severity and Description of Pain Patient Has Paino No Site Locations Pain Management and Medication Current Pain Management: Electronic Signature(s) Signed: 01/07/2021 5:40:25 PM By: Fonnie Mu RN Entered By: Fonnie Mu on 01/07/2021 14:32:15 -------------------------------------------------------------------------------- Patient/Caregiver Education Details Patient Name: Date of Service: [name], [name]  2/11/2022andnbsp2:00 PM Medical Record Number: [id number] Patient Account Number: [id number] Date of Birth/Gender: Treating RN: 07/04/1946 (75 y.o. [name] Primary Care Physician: Mayer Masker Other Clinician: Referring Physician: Treating Physician/Extender: Manning Charity in Treatment: 1 Education Assessment Education Provided To: Patient Education Topics Provided Venous: Methods: Explain/Verbal Responses: Reinforcements needed, State content correctly Electronic Signature(s) Signed: 01/07/2021 5:49:38 PM By: Zenaida Deed RN, BSN Entered By: Zenaida Deed on 01/07/2021 15:12:30 -------------------------------------------------------------------------------- Wound Assessment Details Patient Name: Date of Service: [name], [name] Bray H. 01/07/2021 2:00 PM Medical Record Number: [id number] Patient Account Number: [id number] Date of Birth/Sex: Treating RN: March 05, 1946 (75 y.o. [name], [name] Primary Care Jessie Cowher: Mayer Masker Other Clinician: Referring Jermane Brayboy: Treating Pollyann Roa/Extender: Manning Charity in Treatment: 1 Wound Status Wound Number: 4 Primary Venous Leg Ulcer Etiology: Wound Location: Left, Lateral Lower Leg Wound Open Wounding Event: Gradually Appeared Status: Date Acquired: 08/27/2020 Comorbid Cataracts, Anemia, Lymphedema, Congestive Heart Failure, Weeks Of Treatment: 1 History: Hypertension, Peripheral Venous Disease, Osteoarthritis, Clustered Wound: No Neuropathy, Confinement Anxiety Wound Measurements Length: (cm) Width: (cm) Depth: (cm) Area: (cm) Volume: (cm) 0 % Reduction in Area: 100% 0 % Reduction in Volume: 100% 0 Epithelialization: Large (67-100%) 0 Tunneling: No 0 Undermining: No Wound Description Classification: Full Thickness Without Exposed Support Structures Wound Margin: Distinct, outline attached Exudate Amount: Medium Exudate Type:  Serosanguineous Exudate Color: red, brown Foul Odor After Cleansing: No Slough/Fibrino Yes Wound Bed Granulation Amount: Large (67-100%) Exposed Structure Granulation Quality: Red, Pink Fascia Exposed: No Necrotic Amount: None Present (0%) Fat Layer (Subcutaneous Tissue) Exposed: No Tendon Exposed: No Muscle Exposed: No Joint Exposed: No Bone Exposed: No Electronic Signature(s) Signed: 01/07/2021 5:40:25 PM By: Fonnie Mu RN Entered By: Fonnie Mu on 01/07/2021 14:34:20 -------------------------------------------------------------------------------- Wound Assessment Details Patient Name: Date of Service: [name], [name] Bray H. 01/07/2021 2:00 PM Medical Record Number: [id number] Patient Account Number: [id number] Date of Birth/Sex: Treating RN: [birth year] (75 y.o. [name] Primary Care Leidy Massar: Mayer Masker Other Clinician: Referring Delno Blaisdell: Treating Jaidev Sanger/Extender: Manning Charity in Treatment: 1 Wound Status Wound Number: 5 Primary Venous Leg Ulcer Etiology: Wound Location: Right, Anterior Lower Leg Wound Healed - Epithelialized Wounding Event: Gradually Appeared Status: Date Acquired: 11/30/2020 Comorbid Cataracts, Anemia, Lymphedema, Congestive Heart Failure, Weeks Of Treatment: 1 History: Hypertension, Peripheral Venous Disease, Osteoarthritis, Clustered Wound: No Neuropathy, Confinement Anxiety Wound Measurements Length: (cm) Width: (cm) Depth: (cm) Area: (cm) Volume: (cm) 0 % Reduction in Area: 100% 0 % Reduction in Volume: 100% 0 Epithelialization: Large (67-100%) 0 Tunneling: No 0 Undermining: No Wound Description Classification: Full Thickness Without Exposed Support Structures Wound Margin: Distinct, outline attached Exudate Amount: Medium Exudate Type: Serosanguineous Exudate Color: red, brown Foul Odor After Cleansing: No Slough/Fibrino No Wound Bed Granulation Amount: Large (67-100%)  Exposed Structure Granulation Quality: Red, Pink Fascia Exposed: No Necrotic Amount: None Present (0%) Fat Layer (Subcutaneous Tissue) Exposed: No Tendon Exposed: No Muscle Exposed: No Joint Exposed: No Bone Exposed: No Electronic Signature(s) Signed: 01/07/2021 5:49:38 PM By: Zenaida Deed RN, BSN Entered By: Zenaida Deed on 01/07/2021 15:14:13 -------------------------------------------------------------------------------- Vitals Details Patient Name: Date of Service: [name], [name] 01/07/2021 2:00 PM Medical Record Number: [id number] Patient Account Number: [id number] Date of Birth/Sex: Treating RN: Apr 04, 1946 (75 y.o. [name], [name] Primary Care Jeromy Borcherding: Mayer Masker Other Clinician: Referring Francoise Chojnowski: Treating Hooria Gasparini/Extender: Manning Charity in Treatment: 1 Vital Signs Time Taken: 14:20 Temperature (F): 98.1 Height (in): 65 Pulse (bpm): 72 Weight (lbs): 200 Respiratory Rate (breaths/min): 17 Body Mass Index (BMI): 33.3 Blood Pressure (mmHg): 145/77 Reference Range: 80 - 120 mg / dl Electronic Signature(s) Signed: 01/07/2021 5:40:25 PM By: Fonnie Mu RN Entered By: Fonnie Mu on 01/07/2021 14:32:08

## 2021-01-07 NOTE — Progress Notes (Signed)
[name], [name] ([id number]) Visit Report for 01/07/2021 HPI Details Patient Name: Date of Service: [name] [name], [name] 01/07/2021 2:00 PM Medical Record Number: [id number] Patient Account Number: [id number] Date of Birth/Sex: Treating RN: 07-Mar-1946 (75 y.o. [name]) [name] Primary Care Provider: [name], [name] Other Clinician: Referring Provider: Treating Provider/Extender: [name] [name] in Treatment: 1 History of Present Illness HPI Description: 07/09/18 on evaluation today patient presents for bilateral lower extremity ulcers. She has a past medical history significant for chronic venous stasis, hypertension, and lower extremity edema with associated skin changes. Unfortunately she tells me that roughly 3 [name] ago she noted ulcers on the left lower extremity which started given her trouble. Subsequently she ended up proceeding with applying mupirocin ointment and was keeping this open air for the most part. She states that it would scab over somewhat and then reopened. The wounds have been training quite significantly. Fortunately there does not appear to be any evidence of overt infection there is some erythematous type skin changes but I believe this is likely stasis dermatitis nothing more. Fortunately she has no evidence of any systemic infection. No fevers, chills, nausea, or vomiting noted at this time. She has been recommended before to wear compression stockings but she does not wear them on a regular basis. She has never been suggested to get Juxta-Lite compression wraps she was not aware of what these were. Patient had venous studies performed on 11/09/17 which showed no lower surety DVT or deep vein insufficiency noted. She did not have evaluation however of the superficial veins. There was full compressibility noted throughout. Her arterial study which was performed on 11/18/17 actually showed that she has an ABI on the right of 1.17 with a TBI of  1.59 along with an ABI of 1.12 on the left and a TBI of 0.99. 07/17/18 on evaluation today patient appears to be doing much better in regard to her left lower Trinity ulcers. She's been tolerating the dressing changes without complication. There does not appear to be any evidence of infection which is good news. Overall I'm very pleased with the progress at this time. 07/24/18 on evaluation today patient's wound actually appears to be completely healed at this time. Fortunately there does not appear to be any evidence of infection which is good news. Overall I'm very happy with the progress that has been made. She did get her Velcro compression wraps. READMISSION 12/31/2020 This is a patient we had in this clinic in 2019 felt to have chronic venous insufficiency wounds on her lower extremities. She was discharged with bilateral compression stockings. She tells us that she never really wore them or at best wore them sparingly. She is back in clinic today having a ulcer on her left lateral lower extremity since October and one on the right anterior for the last 2 [name]. I do not know that she is doing any primary dressing on these certainly not wearing any form of compression. She is already been evaluated by vein and vascular on 11/15/2020. They noted that she did have ulcers on the left leg in October that healed with a need Unna boot. She had a reflux study on 12/20 that did not show evidence of a DVT or venous reflux bilaterally. They was not felt that she had arterial insufficiency based on the fact that she had brisk biphasic Doppler signals in the bilateral DP PT. The patient comes in today with superficial wounds on the right anterior mid tibia and the left  lateral calf. These are small appear to have healthy surfaces. Past medical history includes cervical and upper thoracic spine surgery on 7/21, hernia repair early in January, hypertension, hyperlipidemia, hypothyroidism, gastric bypass surgery,  DVT in the right peroneal artery and left popliteal and peroneal veins. Next problem CHF. She does not have diabetes ABIs in our clinic were 1.1 on the right and 1.2 on the lower 2/11; the patient's wound on the left lateral and right anterior lower leg are closed for edema control is good. She has an assortment of old juxta lite stockings with old liners. She does not like to wear anything in particular under the compression part of the juxta lite stocking. I think she is going to do exactly what she wants. Electronic Signature(s) Signed: 01/07/2021 5:36:32 PM By: [name] Najjar MD Entered By: [name] on 01/07/2021 15:21:41 -------------------------------------------------------------------------------- Physical Exam Details Patient Name: Date of Service: [name] Haddock, Latresha H. 01/07/2021 2:00 PM Medical Record Number: [id number] Patient Account Number: [id number] Date of Birth/Sex: Treating RN: 12-01-1973 (75 y.o. [name] Primary Care Provider: Mayer Masker Other Clinician: Referring Provider: Treating Provider/Extender: [name] Charity in Treatment: 1 Notes Wound exam; the patient's small wounds on the left lateral and right anterior lower legs are both epithelialized. Her edema control is good. Electronic Signature(s) Signed: 01/07/2021 5:36:32 PM By: [name] Najjar MD Entered By: [name] on 01/07/2021 15:22:24 -------------------------------------------------------------------------------- Physician Orders Details Patient Name: Date of Service: [name] Haddock, Yasmen H. 01/07/2021 2:00 PM Medical Record Number: [id number] Patient Account Number: [id number] Date of Birth/Sex: Treating RN: 09/08/1974 (75 y.o. [name] Primary Care Provider: Mayer Masker Other Clinician: Referring Provider: Treating Provider/Extender: [name] Charity in Treatment: 1 Verbal / Phone Orders: No Diagnosis Coding ICD-10  Coding Code Description [phone] Non-pressure chronic ulcer of other part of right lower leg with fat layer exposed L97.822 Non-pressure chronic ulcer of other part of left lower leg with fat layer exposed I89.0 Lymphedema, not elsewhere classified Discharge From [facility] Services Discharge from Wound Care Center Edema Control - Lymphedema / SCD / Other Bilateral Lower Extremities Elevate legs to the level of the heart or above for 30 minutes daily and/or when sitting, a frequency of: - 3-4 times per day Avoid standing for long periods of time. Patient to wear own compression stockings every day. Exercise regularly Moisturize legs daily. Compression stocking or Garment 20-30 mm/Hg pressure to: - compression stockings or compression garments daily. apply first thing in the morning and remove at night Electronic Signature(s) Signed: 01/07/2021 5:36:32 PM By: [name] Najjar MD Signed: 01/07/2021 5:49:38 PM By: Zenaida Deed RN, BSN Entered By: Zenaida Deed on 01/07/2021 15:16:41 -------------------------------------------------------------------------------- Problem List Details Patient Name: Date of Service: [name] Haddock, Cressie H. 01/07/2021 2:00 PM Medical Record Number: [id number] Patient Account Number: [id number] Date of Birth/Sex: Treating RN: [birth year] (75 y.o. Billy [place], [name] Primary Care Provider: Mayer Masker Other Clinician: Referring Provider: Treating Provider/Extender: [name] Charity in Treatment: 1 Active Problems ICD-10 Encounter Code Description Active Date MDM Diagnosis [phone] Non-pressure chronic ulcer of other part of right lower leg with fat layer 12/31/2020 No Yes exposed L97.822 Non-pressure chronic ulcer of other part of left lower leg with fat layer exposed2/02/2021 No Yes I89.0 Lymphedema, not elsewhere classified 12/31/2020 No Yes Inactive Problems Resolved Problems Electronic Signature(s) Signed: 01/07/2021 5:36:32 PM By:  [name] Najjar MD Entered By: [name] on 01/07/2021 15:18:05 -------------------------------------------------------------------------------- Progress Note Details Patient Name: Date of Service: [facility] [name], Nikiyah H.  01/07/2021 2:00 PM Medical Record Number: [id number] Patient Account Number: [id number] Date of Birth/Sex: Treating RN: [birth year] (75 y.o. [name] Primary Care Provider: Mayer Masker Other Clinician: Referring Provider: Treating Provider/Extender: [name] Charity in Treatment: 1 Subjective History of Present Illness (HPI) 07/09/18 on evaluation today patient presents for bilateral lower extremity ulcers. She has a past medical history significant for chronic venous stasis, hypertension, and lower extremity edema with associated skin changes. Unfortunately she tells me that roughly 3 [name] ago she noted ulcers on the left lower extremity which started given her trouble. Subsequently she ended up proceeding with applying mupirocin ointment and was keeping this open air for the most part. She states that it would scab over somewhat and then reopened. The wounds have been training quite significantly. Fortunately there does not appear to be any evidence of overt infection there is some erythematous type skin changes but I believe this is likely stasis dermatitis nothing more. Fortunately she has no evidence of any systemic infection. No fevers, chills, nausea, or vomiting noted at this time. She has been recommended before to wear compression stockings but she does not wear them on a regular basis. She has never been suggested to get Juxta-Lite compression wraps she was not aware of what these were. Patient had venous studies performed on 11/09/17 which showed no lower surety DVT or deep vein insufficiency noted. She did not have evaluation however of the superficial veins. There was full compressibility noted throughout. Her arterial study  which was performed on 11/18/17 actually showed that she has an ABI on the right of 1.17 with a TBI of 1.59 along with an ABI of 1.12 on the left and a TBI of 0.99. 07/17/18 on evaluation today patient appears to be doing much better in regard to her left lower Trinity ulcers. She's been tolerating the dressing changes without complication. There does not appear to be any evidence of infection which is good news. Overall I'm very pleased with the progress at this time. 07/24/18 on evaluation today patient's wound actually appears to be completely healed at this time. Fortunately there does not appear to be any evidence of infection which is good news. Overall I'm very happy with the progress that has been made. She did get her Velcro compression wraps. READMISSION 12/31/2020 This is a patient we had in this clinic in 2019 felt to have chronic venous insufficiency wounds on her lower extremities. She was discharged with bilateral compression stockings. She tells [place] that she never really wore them or at best wore them sparingly. She is back in clinic today having a ulcer on her left lateral lower extremity since October and one on the right anterior for the last 2 [name]. I do not know that she is doing any primary dressing on these certainly not wearing any form of compression. She is already been evaluated by vein and vascular on 11/15/2020. They noted that she did have ulcers on the left leg in October that healed with a need Unna boot. She had a reflux study on 12/20 that did not show evidence of a DVT or venous reflux bilaterally. They was not felt that she had arterial insufficiency based on the fact that she had brisk biphasic Doppler signals in the bilateral DP PT. The patient comes in today with superficial wounds on the right anterior mid tibia and the left lateral calf. These are small appear to have healthy surfaces. Past medical history includes cervical and upper thoracic spine  surgery on 7/21,  hernia repair early in January, hypertension, hyperlipidemia, hypothyroidism, gastric bypass surgery, DVT in the right peroneal artery and left popliteal and peroneal veins. Next problem CHF. She does not have diabetes ABIs in our clinic were 1.1 on the right and 1.2 on the lower 2/11; the patient's wound on the left lateral and right anterior lower leg are closed for edema control is good. She has an assortment of old juxta lite stockings with old liners. She does not like to wear anything in particular under the compression part of the juxta lite stocking. I think she is going to do exactly what she wants. Objective Constitutional Vitals Time Taken: 2:20 PM, Height: 65 in, Weight: 200 lbs, BMI: 33.3, Temperature: 98.1 F, Pulse: 72 bpm, Respiratory Rate: 17 breaths/min, Blood Pressure: 145/77 mmHg. Integumentary (Hair, Skin) Wound #4 status is Open. Original cause of wound was Gradually Appeared. The wound is located on the Left,Lateral Lower Leg. The wound measures 0cm length x 0cm width x 0cm depth; 0cm^2 area and 0cm^3 volume. There is no tunneling or undermining noted. There is a medium amount of serosanguineous drainage noted. The wound margin is distinct with the outline attached to the wound base. There is large (67-100%) red, pink granulation within the wound bed. There is no necrotic tissue within the wound bed. Wound #5 status is Healed - Epithelialized. Original cause of wound was Gradually Appeared. The wound is located on the Right,Anterior Lower Leg. The wound measures 0cm length x 0cm width x 0cm depth; 0cm^2 area and 0cm^3 volume. There is no tunneling or undermining noted. There is a medium amount of serosanguineous drainage noted. The wound margin is distinct with the outline attached to the wound base. There is large (67-100%) red, pink granulation within the wound bed. There is no necrotic tissue within the wound bed. Assessment Active Problems ICD-10 Non-pressure chronic  ulcer of other part of right lower leg with fat layer exposed Non-pressure chronic ulcer of other part of left lower leg with fat layer exposed Lymphedema, not elsewhere classified Plan Discharge From [facility] Services: Discharge from Wound Care Center Edema Control - Lymphedema / SCD / Other: Elevate legs to the level of the heart or above for 30 minutes daily and/or when sitting, a frequency of: - 3-4 times per day Avoid standing for long periods of time. Patient to wear own compression stockings every day. Exercise regularly Moisturize legs daily. Compression stocking or Garment 20-30 mm/Hg pressure to: - compression stockings or compression garments daily. apply first thing in the morning and remove at night 1. I have discharged her to her own juxta lite stockings or what ever pieces of them she chooses to put on. 2. I am not completely confident about her ability to correctly apply things nor her motivation to do so. 3. Because of wanted to I suspect that she will be back in the clinic soon however for now she is dischargeable Electronic Signature(s) Signed: 01/07/2021 5:36:32 PM By: [name] Najjar MD Entered By: [name] on 01/07/2021 15:23:15 -------------------------------------------------------------------------------- SuperBill Details Patient Name: Date of Service: [name] Haddock, Ashlea H. 01/07/2021 Medical Record Number: [id number] Patient Account Number: [id number] Date of Birth/Sex: Treating RN: 05-Jan-1946 (75 y.o. [name] Primary Care Provider: Mayer Masker Other Clinician: Referring Provider: Treating Provider/Extender: [name] Charity in Treatment: 1 Diagnosis Coding ICD-10 Codes Code Description [phone] Non-pressure chronic ulcer of other part of right lower leg with fat layer exposed L97.822 Non-pressure chronic ulcer of other part  of left lower leg with fat layer exposed I89.0 Lymphedema, not elsewhere classified Facility  Procedures CPT4 Code: [id number] Description: 99213 - WOUND CARE VISIT-LEV 3 EST PT Modifier: Quantity: 1 Physician Procedures : CPT4 Code Description Modifier [id number] [phone] - WC PHYS LEVEL 2 - EST PT ICD-10 Diagnosis Description L97.812 Non-pressure chronic ulcer of other part of right lower leg with fat layer exposed L97.822 Non-pressure chronic ulcer of other part of left  lower leg with fat layer exposed Quantity: 1 Electronic Signature(s) Signed: 01/07/2021 5:36:32 PM By: [name] Najjar MD Entered By: [name] on 01/07/2021 15:23:42

## 2021-01-12 ENCOUNTER — Other Ambulatory Visit: Payer: PPO

## 2021-01-12 ENCOUNTER — Other Ambulatory Visit: Payer: Self-pay

## 2021-01-12 DIAGNOSIS — E039 Hypothyroidism, unspecified: Secondary | ICD-10-CM

## 2021-01-13 LAB — T4, FREE: Free T4: 1.64 ng/dL (ref 0.82–1.77)

## 2021-01-13 LAB — TSH: TSH: 0.224 u[IU]/mL — ABNORMAL LOW (ref 0.450–4.500)

## 2021-01-13 LAB — T3: T3, Total: 73 ng/dL (ref 71–180)

## 2021-01-17 ENCOUNTER — Other Ambulatory Visit: Payer: Self-pay | Admitting: Sports Medicine

## 2021-01-26 ENCOUNTER — Other Ambulatory Visit: Payer: Self-pay | Admitting: Physician Assistant

## 2021-01-26 DIAGNOSIS — E782 Mixed hyperlipidemia: Secondary | ICD-10-CM

## 2021-01-28 ENCOUNTER — Other Ambulatory Visit: Payer: PPO

## 2021-01-29 DIAGNOSIS — Z20822 Contact with and (suspected) exposure to covid-19: Secondary | ICD-10-CM | POA: Diagnosis not present

## 2021-01-31 ENCOUNTER — Other Ambulatory Visit: Payer: PPO

## 2021-01-31 DIAGNOSIS — Z20822 Contact with and (suspected) exposure to covid-19: Secondary | ICD-10-CM

## 2021-02-01 LAB — SARS-COV-2, NAA 2 DAY TAT

## 2021-02-01 LAB — NOVEL CORONAVIRUS, NAA: SARS-CoV-2, NAA: NOT DETECTED

## 2021-02-17 ENCOUNTER — Other Ambulatory Visit: Payer: Self-pay | Admitting: Physician Assistant

## 2021-02-18 ENCOUNTER — Other Ambulatory Visit: Payer: Self-pay | Admitting: Physician Assistant

## 2021-02-18 DIAGNOSIS — E039 Hypothyroidism, unspecified: Secondary | ICD-10-CM

## 2021-02-22 ENCOUNTER — Other Ambulatory Visit: Payer: PPO

## 2021-02-22 ENCOUNTER — Other Ambulatory Visit: Payer: Self-pay

## 2021-02-22 DIAGNOSIS — E039 Hypothyroidism, unspecified: Secondary | ICD-10-CM

## 2021-02-22 NOTE — Addendum Note (Signed)
Addended by: Sylvester Harder on: 02/22/2021 11:09 AM   Modules accepted: Orders

## 2021-02-23 LAB — T4, FREE: Free T4: 1.1 ng/dL (ref 0.82–1.77)

## 2021-02-23 LAB — TSH: TSH: 0.222 u[IU]/mL — ABNORMAL LOW (ref 0.450–4.500)

## 2021-02-23 LAB — T3: T3, Total: 77 ng/dL (ref 71–180)

## 2021-02-25 ENCOUNTER — Other Ambulatory Visit: Payer: Self-pay | Admitting: Physician Assistant

## 2021-02-25 DIAGNOSIS — E039 Hypothyroidism, unspecified: Secondary | ICD-10-CM

## 2021-02-25 MED ORDER — LEVOTHYROXINE SODIUM 125 MCG PO TABS: 125.0000 ug | ORAL_TABLET | Freq: Every day | ORAL | 0 refills | Status: DC

## 2021-04-11 ENCOUNTER — Other Ambulatory Visit: Payer: Self-pay | Admitting: Physician Assistant

## 2021-04-11 DIAGNOSIS — E039 Hypothyroidism, unspecified: Secondary | ICD-10-CM

## 2021-04-11 DIAGNOSIS — Z6838 Body mass index (BMI) 38.0-38.9, adult: Secondary | ICD-10-CM | POA: Insufficient documentation

## 2021-04-11 DIAGNOSIS — M5003 Cervical disc disorder with myelopathy, cervicothoracic region: Secondary | ICD-10-CM | POA: Diagnosis not present

## 2021-04-12 ENCOUNTER — Other Ambulatory Visit: Payer: PPO

## 2021-04-12 ENCOUNTER — Other Ambulatory Visit: Payer: Self-pay

## 2021-04-12 DIAGNOSIS — R5383 Other fatigue: Secondary | ICD-10-CM | POA: Diagnosis not present

## 2021-04-12 DIAGNOSIS — E538 Deficiency of other specified B group vitamins: Secondary | ICD-10-CM

## 2021-04-12 DIAGNOSIS — E039 Hypothyroidism, unspecified: Secondary | ICD-10-CM | POA: Diagnosis not present

## 2021-04-12 DIAGNOSIS — E559 Vitamin D deficiency, unspecified: Secondary | ICD-10-CM

## 2021-04-12 NOTE — Addendum Note (Signed)
Addended by: Sylvester Harder on: 04/12/2021 10:19 AM   Modules accepted: Orders

## 2021-04-13 LAB — COMPREHENSIVE METABOLIC PANEL
ALT: 18 IU/L (ref 0–32)
AST: 23 IU/L (ref 0–40)
Albumin/Globulin Ratio: 1.6 (ref 1.2–2.2)
Albumin: 4.1 g/dL (ref 3.7–4.7)
Alkaline Phosphatase: 104 IU/L (ref 44–121)
BUN/Creatinine Ratio: 19 (ref 12–28)
BUN: 24 mg/dL (ref 8–27)
Bilirubin Total: 0.8 mg/dL (ref 0.0–1.2)
CO2: 27 mmol/L (ref 20–29)
Calcium: 8.9 mg/dL (ref 8.7–10.3)
Chloride: 94 mmol/L — ABNORMAL LOW (ref 96–106)
Creatinine, Ser: 1.29 mg/dL — ABNORMAL HIGH (ref 0.57–1.00)
Globulin, Total: 2.5 g/dL (ref 1.5–4.5)
Glucose: 93 mg/dL (ref 65–99)
Potassium: 4.5 mmol/L (ref 3.5–5.2)
Sodium: 138 mmol/L (ref 134–144)
Total Protein: 6.6 g/dL (ref 6.0–8.5)
eGFR: 44 mL/min/{1.73_m2} — ABNORMAL LOW (ref >59)

## 2021-04-13 LAB — CBC
Hematocrit: 38.1 % (ref 34.0–46.6)
Hemoglobin: 13 g/dL (ref 11.1–15.9)
MCH: 34.2 pg — ABNORMAL HIGH (ref 26.6–33.0)
MCHC: 34.1 g/dL (ref 31.5–35.7)
MCV: 100 fL — ABNORMAL HIGH (ref 79–97)
Platelets: 197 10*3/uL (ref 150–450)
RBC: 3.8 x10E6/uL (ref 3.77–5.28)
RDW: 12.7 % (ref 11.7–15.4)
WBC: 5.6 10*3/uL (ref 3.4–10.8)

## 2021-04-13 LAB — B12 AND FOLATE PANEL
Folate: 8.7 ng/mL (ref >3.0)
Vitamin B-12: 674 pg/mL (ref 232–1245)

## 2021-04-13 LAB — T3: T3, Total: 73 ng/dL (ref 71–180)

## 2021-04-13 LAB — TSH: TSH: 0.998 u[IU]/mL (ref 0.450–4.500)

## 2021-04-13 LAB — VITAMIN D 25 HYDROXY (VIT D DEFICIENCY, FRACTURES): Vit D, 25-Hydroxy: 50.9 ng/mL (ref 30.0–100.0)

## 2021-04-13 LAB — T4, FREE: Free T4: 1.22 ng/dL (ref 0.82–1.77)

## 2021-04-19 ENCOUNTER — Ambulatory Visit: Payer: PPO | Admitting: Nurse Practitioner

## 2021-04-26 ENCOUNTER — Encounter: Payer: Self-pay | Admitting: Nurse Practitioner

## 2021-04-26 ENCOUNTER — Ambulatory Visit (INDEPENDENT_AMBULATORY_CARE_PROVIDER_SITE_OTHER): Payer: PPO | Admitting: Nurse Practitioner

## 2021-04-26 VITALS — BP 142/82 | HR 60 | Wt 230.0 lb

## 2021-04-26 DIAGNOSIS — E039 Hypothyroidism, unspecified: Secondary | ICD-10-CM

## 2021-04-26 DIAGNOSIS — I10 Essential (primary) hypertension: Secondary | ICD-10-CM | POA: Diagnosis not present

## 2021-04-26 DIAGNOSIS — N289 Disorder of kidney and ureter, unspecified: Secondary | ICD-10-CM

## 2021-04-26 DIAGNOSIS — E782 Mixed hyperlipidemia: Secondary | ICD-10-CM | POA: Diagnosis not present

## 2021-04-26 DIAGNOSIS — F10982 Alcohol use, unspecified with alcohol-induced sleep disorder: Secondary | ICD-10-CM | POA: Diagnosis not present

## 2021-04-26 NOTE — Patient Instructions (Signed)
Alcohol Abuse and Nutrition Alcohol abuse is any pattern of alcohol consumption that harms your health, relationships, or work. Alcohol abuse can cause poor nutrition (malnutrition or malnourishment) and a lack of nutrients (nutrient deficiencies), which can lead to more complications. Alcohol abuse brings malnutrition and nutrient deficiencies in two ways:  It causes your liver to work abnormally. This affects how your body divides (breaks down) and absorbs nutrients from food.  It causes you to eat poorly. Many people who abuse alcohol do not eat enough carbohydrates, protein, fat, vitamins, and minerals. Nutrients that are commonly lacking (deficient) in people who abuse alcohol include:  Vitamins. ? Vitamin A. This is needed for your vision, metabolism, and ability to fight off infections (immunity). ? B vitamins. These include folate, thiamine, and niacin. These are needed for new cell growth. ? Vitamin C. This plays an important role in wound healing, immunity, and helping your body to absorb iron. ? Vitamin D. This is necessary for your body to absorb and use calcium. It is produced by your liver, but you can also get it from food and from sun exposure.  Minerals. ? Calcium. This is needed for healthy bones as well as heart and blood vessel (cardiovascular) function. ? Iron. This is important for blood, muscle, and nervous system functioning. ? Magnesium. This plays an important role in muscle and nerve function, and it helps to control blood sugar and blood pressure. ? Zinc. This is important for the normal functioning of your nervous system and digestive system (gastrointestinal tract). If you think that you have an alcohol dependency problem, or if it is hard to stop drinking because you feel sick or different when you do not use alcohol, talk with your health care provider or another health professional about where to get help. Nutrition is an essential factor in therapy for alcohol  abuse. Your health care provider or diet and nutrition specialist (dietitian) will work with you to design a plan that can help to restore nutrients to your body and prevent the risk of complications. What is my plan? Your dietitian may develop a specific eating plan that is based on your condition and any other problems that you have. An eating plan will commonly include:  A balanced diet. ? Grains: 6-8 oz (170-227 g) a day. Examples of 1 oz of whole grains include 1 cup of whole-wheat cereal,  cup of brown rice, or 1 slice of whole-wheat bread. ? Vegetables: 2-3 cups a day. Examples of 1 cup of vegetables include 2 medium carrots, 1 large tomato, or 2 stalks of celery. ? Fruits: 1-2 cups a day. Examples of 1 cup of fruit include 1 large banana, 1 small apple, 8 large strawberries, or 1 large orange. ? Meat and other protein: 5-6 oz (142-170 g) a day.  A cut of meat or fish that is the size of a deck of cards is about 3-4 oz.  Foods that provide 1 oz of protein include 1 egg,  cup of nuts or seeds, or 1 tablespoon (16 g) of peanut butter. ? Dairy: 2-3 cups a day. Examples of 1 cup of dairy include 8 oz (230 mL) of milk, 8 oz (230 g) of yogurt, or 1 oz (44 g) of natural cheese.  Vitamin and mineral supplements.   What are tips for following this plan?  Eat frequent meals and snacks. Try to eat 5-6 small meals each day.  Take vitamin or mineral supplements as recommended by your dietitian.  If you  are malnourished or if your dietitian recommends it: ? You may follow a high-protein, high-calorie diet. This may include:  2,000-3,000 calories (kilocalories) a day.  70-100 g (grams) of protein a day. ? You may be directed to follow a diet that includes a complete nutritional supplement beverage. This can help to restore calories, protein, and vitamins to your body. Depending on your condition, you may be advised to consume this beverage instead of your meals or in addition to  them.  Certain medicines may cause changes in your appetite, taste, and weight. Work with your health care provider and dietitian to make any changes to your medicines and eating plan.  If you are unable to take in enough food and calories by mouth, your health care provider may recommend a feeding tube. This tube delivers nutritional supplements directly to your stomach. Recommended foods  Eat foods that are high in molecules that prevent oxygen from reacting with your food (antioxidants). These foods include grapes, berries, nuts, green tea, and dark green or orange vegetables. Eating these can help to prevent some of the stress that is placed on your liver by consuming alcohol.  Eat a variety of fresh fruits and vegetables each day. This will help you to get fiber and vitamins in your diet.  Drink plenty of water and other clear fluids, such as apple juice and broth. Try to drink at least 48-64 oz (1.5-2 L) of water a day.  Include foods fortified with vitamins and minerals in your diet. Commonly fortified foods include milk, orange juice, cereal, and bread.  Eat a variety of foods that are high in omega-3 and omega-6 fatty acids. These include fish, nuts and seeds, and soybeans. These foods may help your liver to recover and may also stabilize your mood.  If you are a vegetarian: ? Eat a variety of protein-rich foods. ? Pair whole grains with plant-based proteins at meals and snack time. For example, eat rice with beans, put peanut butter on whole-grain toast, or eat oatmeal with sunflower seeds. The items listed above may not be a complete list of foods and beverages you can eat. Contact a dietitian for more information. Foods to avoid  Avoid foods and drinks that are high in fat and sugar. Sugary drinks, salty snacks, and candy contain empty calories. This means that they lack important nutrients such as protein, fiber, and vitamins.  Avoid alcohol. This is the best way to avoid  malnutrition due to alcohol abuse. If you must drink, drink measured amounts. Measured drinking means limiting your intake to no more than 1 drink a day for nonpregnant women and 2 drinks a day for men. One drink equals 12 oz (355 mL) of beer, 5 oz (148 mL) of wine, or 1 oz (44 mL) of hard liquor.  Limit your intake of caffeine. Replace drinks like coffee and black tea with decaffeinated coffee and decaffeinated herbal tea. The items listed above may not be a complete list of foods and beverages you should avoid. Contact a dietitian for more information. Summary  Alcohol abuse can cause poor nutrition (malnutrition or malnourishment) and a lack of nutrients (nutrient deficiencies), which can lead to more health problems.  Common nutrient deficiencies include vitamin deficiencies (A, B, C, and D) and mineral deficiencies (calcium, iron, magnesium, and zinc).  Nutrition is an essential factor in therapy for alcohol abuse.  Your health care provider and dietitian can help you to develop a specific eating plan that includes a balanced diet plus  vitamin and mineral supplements. This information is not intended to replace advice given to you by your health care provider. Make sure you discuss any questions you have with your health care provider. Document Revised: 03/04/2019 Document Reviewed: 07/31/2017 Elsevier Patient Education  2021 [employer].

## 2021-04-26 NOTE — Progress Notes (Signed)
Virtual Visit via Telephone Note  I connected with [name] on 05/01/21 at  1:50 PM EDT by telephone and verified that I am speaking with the correct person using two identifiers.  Location: Patient: home Provider: [place] [facility]    I discussed the limitations, risks, security and privacy concerns of performing an evaluation and management service by telephone and the availability of in person appointments. I also discussed with the patient that there may be a patient responsible charge related to this service. The patient expressed understanding and agreed to proceed.   History of Present Illness: The patient presents for follow up of lab results. States that she had been feeling fatigued and a little lethargic in past few months. She started increasing her protein intake and decreasing the amount of carbs she eats every day. She states that she has also increased her activity levels recently. Energy levels are starting to improve. Her labs indicate that her renal functions are declining slightly. Other results were essentially normal. She states that she does drink too much Etoh. 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 She states that she does drink every day, but she is trying to improve this. She states that she does hae good days and bad days with this. She states that boredom and inactivity do contribute to the bad days. On bad days, she drinks significantly more. She states that she does have some trouble sleeping from time to time. Trazodone was added at her most recent visit. States that she does not take this every day. When needed, it does help her to fall asleep and stay asleep. She states that she has no other concerns or complaints today. She denies chest pain, chest pressure, or shortness of breath. She denies headaches or visual disturbances. She denies abdominal pain, nausea, vomiting, or changes in bowel or bladder habits.      Observations/Objective:  The patient is alert and oriented. She is pleasant and answers all questions appropriately. Breathing is non-labored. She is in no acute distress at this time.   Today's Vitals   04/26/21 1303  BP: (!) 142/82  Pulse: 60  Weight: 230 lb (104.3 kg)   Body mass index is 37.69 kg/m.   Assessment and Plan: 1. Essential hypertension Blood pressure stable. Continue medication as prescribed   2. Hypothyroidism, unspecified type Reviewed labs with normal thyroid panel. Continue levothyroxine as prescribed   3. Mixed hyperlipidemia Continue rosuvastatin as prescribed   4. Insomnia due to alcohol excess and stress Improved with prn use of trazodone. Continue as needed and as prescribed. Patient states that she is trying to cut down use/abuse of alcohol. Will monitor.   5. Abnormal renal function Possibly due to excess alcohol intake. Recommend she continue to reduce daily intake of Etoh and increase water intake. Will recheck at next visit.   Follow Up Instructions:    I discussed the assessment and treatment plan with the patient. The patient was provided an opportunity to ask questions and all were answered. The patient agreed with the plan and demonstrated an understanding of the instructions.   The patient was advised to call back or seek an in-person evaluation if the symptoms worsen or if the condition fails to improve as anticipated.  I provided 22 minutes of non-face-to-face time during this encounter.   Carlean Jews, NP

## 2021-04-28 ENCOUNTER — Other Ambulatory Visit: Payer: Self-pay | Admitting: Physician Assistant

## 2021-04-28 DIAGNOSIS — E782 Mixed hyperlipidemia: Secondary | ICD-10-CM

## 2021-04-28 DIAGNOSIS — E039 Hypothyroidism, unspecified: Secondary | ICD-10-CM

## 2021-04-29 ENCOUNTER — Other Ambulatory Visit: Payer: Self-pay | Admitting: Physician Assistant

## 2021-05-01 DIAGNOSIS — N289 Disorder of kidney and ureter, unspecified: Secondary | ICD-10-CM | POA: Insufficient documentation

## 2021-05-09 ENCOUNTER — Other Ambulatory Visit: Payer: Self-pay | Admitting: Physician Assistant

## 2021-05-09 DIAGNOSIS — F10982 Alcohol use, unspecified with alcohol-induced sleep disorder: Secondary | ICD-10-CM

## 2021-05-25 ENCOUNTER — Other Ambulatory Visit: Payer: Self-pay | Admitting: Physician Assistant

## 2021-05-25 NOTE — Telephone Encounter (Signed)
Please contact the patient to schedule appointment per last AVS

## 2021-06-20 ENCOUNTER — Other Ambulatory Visit: Payer: Self-pay | Admitting: Physician Assistant

## 2021-06-20 ENCOUNTER — Telehealth: Payer: Self-pay | Admitting: Physician Assistant

## 2021-06-20 DIAGNOSIS — F10982 Alcohol use, unspecified with alcohol-induced sleep disorder: Secondary | ICD-10-CM

## 2021-06-20 NOTE — Telephone Encounter (Signed)
Patient called office and left msg for return call. Attempted to call patient back 3 times and the call is going to an automated msg stating "call did not go through, please try call again later". AS, CMA

## 2021-06-30 ENCOUNTER — Other Ambulatory Visit: Payer: Self-pay | Admitting: Physician Assistant

## 2021-06-30 DIAGNOSIS — E039 Hypothyroidism, unspecified: Secondary | ICD-10-CM

## 2021-07-05 ENCOUNTER — Other Ambulatory Visit: Payer: Self-pay | Admitting: Sports Medicine

## 2021-07-05 DIAGNOSIS — M170 Bilateral primary osteoarthritis of knee: Secondary | ICD-10-CM

## 2021-07-26 ENCOUNTER — Other Ambulatory Visit: Payer: Self-pay | Admitting: Physician Assistant

## 2021-07-26 DIAGNOSIS — I10 Essential (primary) hypertension: Secondary | ICD-10-CM

## 2021-07-26 NOTE — Telephone Encounter (Signed)
Patient is requesting a refill of benazepril-hctz. This medication was previous prescribed by Mariam Dollar, PA.  Last refill given 06/10/2020 for 90 days with 1 refill. Patient has follow up apt scheduled for 08/18/21.   Please approve if deemed appropriate. AS, CMA

## 2021-07-26 NOTE — Addendum Note (Signed)
Addended by: Sylvester Harder on: 07/26/2021 04:20 PM   Modules accepted: Orders

## 2021-07-26 NOTE — Telephone Encounter (Signed)
Patient would like a refill on Lotensin HCT and uses [facility]. Thanks

## 2021-07-27 ENCOUNTER — Other Ambulatory Visit: Payer: Self-pay | Admitting: Physician Assistant

## 2021-07-27 DIAGNOSIS — F10982 Alcohol use, unspecified with alcohol-induced sleep disorder: Secondary | ICD-10-CM

## 2021-07-27 MED ORDER — BENAZEPRIL-HYDROCHLOROTHIAZIDE 20-25 MG PO TABS: 1.0000 | ORAL_TABLET | Freq: Every day | ORAL | 1 refills | Status: DC

## 2021-07-28 ENCOUNTER — Other Ambulatory Visit: Payer: Self-pay | Admitting: Physician Assistant

## 2021-07-28 DIAGNOSIS — E782 Mixed hyperlipidemia: Secondary | ICD-10-CM

## 2021-08-04 ENCOUNTER — Other Ambulatory Visit: Payer: Self-pay | Admitting: Cardiology

## 2021-08-08 ENCOUNTER — Encounter: Payer: PPO | Admitting: Sports Medicine

## 2021-08-11 ENCOUNTER — Ambulatory Visit (INDEPENDENT_AMBULATORY_CARE_PROVIDER_SITE_OTHER): Payer: PPO | Admitting: Sports Medicine

## 2021-08-11 ENCOUNTER — Other Ambulatory Visit: Payer: Self-pay | Admitting: Sports Medicine

## 2021-08-11 ENCOUNTER — Ambulatory Visit (INDEPENDENT_AMBULATORY_CARE_PROVIDER_SITE_OTHER): Payer: PPO

## 2021-08-11 DIAGNOSIS — M170 Bilateral primary osteoarthritis of knee: Secondary | ICD-10-CM

## 2021-08-11 MED ORDER — MELOXICAM 15 MG PO TABS: 15.0000 mg | ORAL_TABLET | Freq: Every day | ORAL | 3 refills | Status: DC

## 2021-08-11 NOTE — Progress Notes (Signed)
    Procedures performed today:    Procedure: Real-time Ultrasound Guided injection of the left knee Device: Samsung HS60  Verbal informed consent obtained.  Time-out conducted.  Noted no overlying erythema, induration, or other signs of local infection.  Skin prepped in a sterile fashion.  Local anesthesia: Topical Ethyl chloride.  With sterile technique and under real time ultrasound guidance: noted effusion, 1 cc Kenalog 40, 2 cc lidocaine, 2 cc bupivacaine injected easily Completed without difficulty  Advised to call if fevers/chills, erythema, induration, drainage, or persistent bleeding.  Images permanently stored and available for review in PACS.  Impression: Technically successful ultrasound guided injection.  Procedure: Real-time Ultrasound Guided injection of the right knee Device: Samsung HS60  Verbal informed consent obtained.  Time-out conducted.  Noted no overlying erythema, induration, or other signs of local infection.  Skin prepped in a sterile fashion.  Local anesthesia: Topical Ethyl chloride.  With sterile technique and under real time ultrasound guidance: noted effusion, 1 cc Kenalog 40, 2 cc lidocaine, 2 cc bupivacaine injected easily Completed without difficulty  Advised to call if fevers/chills, erythema, induration, drainage, or persistent bleeding.  Images permanently stored and available for review in PACS.  Impression: Technically successful ultrasound guided injection.  Independent interpretation of notes and tests performed by another provider:   None.  Brief History, Exam, Impression, and Recommendations:    Primary osteoarthritis of both knees This is a very pleasant 75 year old female, known knee osteoarthritis, exacerbation of chronic pain with increasing pain in both knees, right worse than left, last injection was a year ago, repeat bilateral knee injections today, conditioning exercises given, updated x-rays today. Return to see me in 4 weeks  as needed.    ___________________________________________ Ihor Austin. Benjamin Stain, M.D., ABFM., CAQSM. Primary Care and Sports Medicine [facility]  Adjunct Instructor of Family Medicine  [employer]

## 2021-08-11 NOTE — Assessment & Plan Note (Signed)
This is a very pleasant 75 year old female, known knee osteoarthritis, exacerbation of chronic pain with increasing pain in both knees, right worse than left, last injection was a year ago, repeat bilateral knee injections today, conditioning exercises given, updated x-rays today. Return to see me in 4 weeks as needed.

## 2021-08-18 ENCOUNTER — Other Ambulatory Visit: Payer: Self-pay

## 2021-08-18 ENCOUNTER — Ambulatory Visit (INDEPENDENT_AMBULATORY_CARE_PROVIDER_SITE_OTHER): Payer: PPO | Admitting: Physician Assistant

## 2021-08-18 ENCOUNTER — Encounter: Payer: Self-pay | Admitting: Physician Assistant

## 2021-08-18 VITALS — BP 154/72 | HR 76 | Temp 98.2°F | Ht 66.0 in | Wt 226.0 lb

## 2021-08-18 DIAGNOSIS — I10 Essential (primary) hypertension: Secondary | ICD-10-CM

## 2021-08-18 DIAGNOSIS — E039 Hypothyroidism, unspecified: Secondary | ICD-10-CM | POA: Diagnosis not present

## 2021-08-18 DIAGNOSIS — K219 Gastro-esophageal reflux disease without esophagitis: Secondary | ICD-10-CM | POA: Diagnosis not present

## 2021-08-18 DIAGNOSIS — I5032 Chronic diastolic (congestive) heart failure: Secondary | ICD-10-CM | POA: Diagnosis not present

## 2021-08-18 DIAGNOSIS — F10982 Alcohol use, unspecified with alcohol-induced sleep disorder: Secondary | ICD-10-CM

## 2021-08-18 DIAGNOSIS — Z23 Encounter for immunization: Secondary | ICD-10-CM

## 2021-08-18 DIAGNOSIS — E782 Mixed hyperlipidemia: Secondary | ICD-10-CM

## 2021-08-18 MED ORDER — PANTOPRAZOLE SODIUM 40 MG PO TBEC: 40.0000 mg | DELAYED_RELEASE_TABLET | Freq: Every day | ORAL | 1 refills | Status: DC

## 2021-08-18 NOTE — Patient Instructions (Signed)

## 2021-08-18 NOTE — Progress Notes (Signed)
Established Patient Office Visit  Subjective:  Patient ID: [name], female    DOB: 02-25-1946  Age: 75 y.o. MRN: [id number]  CC:  Chief Complaint  Patient presents with   Follow-up   Gastroesophageal Reflux    HPI [facility] [name] presents for follow up on hypertension and GERD. Patient continues to work on reducing her wine use. States has tried to increase lemonade watered down with water to substitute some of the wine. Patient takes Trazodone 50 mg about 5 times/wk to help with sleep, which has been effective. States had a fall last week which resulted in a bruise near her right elbow and right foot. Denies head injury, LOC, or AMS.  HTN: Pt denies chest pain, palpitations, dizziness or worsening lower extremity swelling. Taking medication as directed without side effects. Checks BP at home and readings range <140/90. Recently took her medication before coming to the office.  GERD: Reports stopped taking alpha lipoic acid which was flaring up her reflux symptoms despite taking pantoprazole as instructed. States symptoms have been better. Taking medication without issues.    Past Medical History:  Diagnosis Date   Alcohol abuse 04/25/2015   reportedly drinks ~ 1 bottle of wine / day   Allergy    Anastomotic ulcer S/P gastric bypass 06/18/2016   Aortic atherosclerosis (HCC)    Arthritis    Cataract    Chronic diastolic heart failure (HCC) 10/25/2017   Degenerative disc disease, cervical    DVT, bilateral lower limbs (HCC) 05/2020   Age-indeterminate bilateral PERONEAL VEIN DVTs --> (was postop from acute cord compression, started on Xarelto Dosepak   Essential hypertension 04/25/2015   GAD (generalized anxiety disorder) 06/14/2016   HNP (herniated nucleus pulposus) with myelopathy, cervical 05/27/2020   Cervical Myelopathy with Spinal cord compression (HCC)  --> s/p Anterior Cervical Discectomy Decompression)    Hypothyroidism    IDA (iron deficiency anemia)     Kidney damage    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   UGIB (upper gastrointestinal bleed) 04/25/2015   Venous stasis of both lower extremities 01/10/2019    Past Surgical History:  Procedure Laterality Date   ABDOMINAL HYSTERECTOMY     ANTERIOR CERVICAL DECOMP/DISCECTOMY FUSION Left 05/26/2020   Procedure: Cervical seven - Thoracic one ANTERIOR CERVICAL DISCECTOMY AND PLATING;  Surgeon: Tressie Stalker, MD;  Location: [facility] OR;  Service: Neurosurgery;  Laterality: Left;   CHOLECYSTECTOMY     COSMETIC SURGERY     extra fat taken off arms and eyelid lifted   ESOPHAGOGASTRODUODENOSCOPY N/A 04/25/2015   Procedure: ESOPHAGOGASTRODUODENOSCOPY (EGD);  Surgeon: Beverley Fiedler, MD;  Location: [facility] ENDOSCOPY;  Service: Endoscopy;  Laterality: N/A;   FRACTURE SURGERY     GASTRIC BYPASS  2003   gastric ulcer repair  04/25/2015   NM MYOVIEW LTD  10/10/2017   Normal LV size and function EF 72%.  Medium sized mild severity defect in the apical septal apical lateral and apical wall suggestive of breast attenuation.  LOW RISK.  No ischemia or infarction.     OPEN REDUCTION INTERNAL FIXATION (ORIF) DISTAL RADIAL FRACTURE Right 01/15/2013   Procedure: OPEN REDUCTION INTERNAL FIXATION (ORIF) Right DISTAL RADIUS FRACTURE;  Surgeon: Eldred Manges, MD;  Location: [facility] OR;  Service: Orthopedics;  Laterality: Right;   SPINE SURGERY     TRANSTHORACIC ECHOCARDIOGRAM  10/04/2017   Mild LVH.  EF 60-65%.  Grade 2/moderate diastolic dysfunction.  Mild pulmonary hypertension.  No valve lesions   VENTRAL  HERNIA REPAIR N/A 12/02/2020   Procedure: HERNIA REPAIR INCISIONAL  ADULT, open;  Surgeon: Jules Husbands, MD;  Location: [facility] ORS;  Service: General;  Laterality: N/A;    Family History  Problem Relation Age of Onset   Heart disease Mother    Stroke Mother 62   Cancer Mother        skin and breast   Hypertension Mother    Breast cancer Mother 87   COPD Father    Cancer Father    Diabetes  Father    Hypertension Father    Stroke Maternal Grandmother    Mental illness Maternal Grandmother    Breast cancer Sister    Cancer Cousin 58    Social History   Socioeconomic History   Marital status: Married    Spouse name: Not on file   Number of children: 4   Years of education: Not on file   Highest education level: Not on file  Occupational History   Occupation: Therapist, sports    Comment: ICU Tree surgeon  Tobacco Use   Smoking status: Never   Smokeless tobacco: Never  Vaping Use   Vaping Use: Never used  Substance and Sexual Activity   Alcohol use: Yes    Alcohol/week: 28.0 standard drinks    Types: 28 Glasses of wine per week    Comment: drinks daily and has a bottle and half of wine per day   Drug use: No   Sexual activity: Yes    Birth control/protection: Post-menopausal  Other Topics Concern   Not on file  Social History Narrative   Marital status: married      Employment: ICU Tree surgeon - retired      Alcohol: drinks bottle of wine daily in 2016   Takes care of her 51 y/o mom at home   Social Determinants of Radio broadcast assistant Strain: Not on file  Food Insecurity: Not on file  Transportation Needs: Not on file  Physical Activity: Not on file  Stress: Not on file  Social Connections: Not on file  Intimate Partner Violence: Not on file    Outpatient Medications Prior to Visit  Medication Sig Dispense Refill   acetaminophen (TYLENOL) 500 MG tablet Take 500 mg by mouth every 6 (six) hours as needed.     benazepril-hydrochlorthiazide (LOTENSIN HCT) 20-25 MG tablet Take 1 tablet by mouth daily. 90 tablet 1   carvedilol (COREG) 6.25 MG tablet TAKE 1 TABLET BY MOUTH 2 TIMES DAILY WITH A MEAL. 180 tablet 0   Cholecalciferol (VITAMIN D3) 125 MCG (5000 UT) TABS Take 3 tablets (15,000 Units total) by mouth See admin instructions. Takes 15000 units 5 days a week and 20000 units on 2 days. (Patient taking differently: Take 15,000 Units by mouth See admin  instructions. Takes 15,000 units 5 days a week and 20,000 units on 2 days.) 30 tablet 0   Cyanocobalamin (VITAMIN B-12) 2500 MCG SUBL Place 2,500 mcg under the tongue once a week.     cyclobenzaprine (FLEXERIL) 10 MG tablet Take 1 tablet (10 mg total) by mouth daily as needed for muscle spasms. . 60 tablet 0   diphenhydrAMINE (BENADRYL) 25 MG tablet Take 25 mg by mouth daily as needed (Allergies).     ferrous sulfate 325 (65 FE) MG tablet Take 1 tablet (325 mg total) by mouth daily with breakfast. 30 tablet 3   furosemide (LASIX) 20 MG tablet Take 20 mg by mouth as needed. Take and additional  if needed for fluid 90 tablet 3   gabapentin (NEURONTIN) 300 MG capsule TAKE 4 CAPSULES BY MOUTH EVERY MORNING, 3 CAPSULES IN THE AFTERNOON, AND 4 CAPSULES AT NIGHT (Patient taking differently: TAKE 3 CAPSULES BY MOUTH EVERY MORNING, AFTERNOON, AND NIGHT.) 990 capsule 0   levothyroxine (SYNTHROID) 125 MCG tablet TAKE 1 TABLET (125 MCG TOTAL) BY MOUTH ONCE DAILY EVERY MORNING BEFORE BREAKFAST 90 tablet 0   meloxicam (MOBIC) 15 MG tablet Take 1 tablet (15 mg total) by mouth daily. 90 tablet 3   Mupirocin 2 % KIT Apply 1 application topically daily as needed (Wound).     rosuvastatin (CRESTOR) 5 MG tablet TAKE 1 TABLET BY MOUTH DAILY. 90 tablet 0   traMADol (ULTRAM) 50 MG tablet TAKE 1 TABLET BY MOUTH EVERY 12 HOURS AS NEEDED FOR PAIN. MAX 6 TABLETS PER DAY AS DIRECTED BY PRESCRIBER 60 tablet 3   traZODone (DESYREL) 50 MG tablet TAKE 1/2 TO 1 TABLET BY MOUTH AT BEDTIME AS NEEDED FOR SLEEP. 30 tablet 0   pantoprazole (PROTONIX) 40 MG tablet TAKE 1 TABLET BY MOUTH DAILY. 90 tablet 0   No facility-administered medications prior to visit.    Allergies  Allergen Reactions   Cocoa Anaphylaxis   Red Dye Hives    Rash and Nausea diarrhea   Lyrica [Pregabalin] Other (See Comments)    Abnormal Nose bleeding   Other Other (See Comments)    Berries : Rash   Sulfa Antibiotics Rash    ROS Review of Systems Review  of Systems:  A fourteen system review of systems was performed and found to be positive as per HPI.   Objective:    Physical Exam General:  Well Developed, well nourished, in no acute distress  Neuro:  Alert and oriented,  extra-ocular muscles intact  HEENT:  Normocephalic, atraumatic, neck supple Skin:  moderate Cardiac:  RRR, S1 S2 Respiratory:  No wheezing or rhonchi, no respiratory distress, Not using accessory muscles, speaking in full sentences- unlabored. Vascular:  Venous insufficiency, wearing open toe compression socks   Psych:  No HI/SI, judgement and insight good, Euthymic mood. Full Affect.  BP (!) 154/72   Pulse 76   Temp 98.2 F (36.8 C)   Ht $R'5\' 6"'XS$  (1.676 m)   Wt 226 lb (102.5 kg)   SpO2 98%   BMI 36.48 kg/m  Wt Readings from Last 3 Encounters:  08/18/21 226 lb (102.5 kg)  04/26/21 230 lb (104.3 kg)  12/29/20 233 lb (105.7 kg)     Health Maintenance Due  Topic Date Due   Hepatitis C Screening  Never done   INFLUENZA VACCINE  06/27/2021   COVID-19 Vaccine (5 - Booster for Pfizer series) 06/28/2021    There are no preventive care reminders to display for this patient.  Lab Results  Component Value Date   TSH 0.782 08/18/2021   Lab Results  Component Value Date   WBC 5.6 04/12/2021   HGB 13.0 04/12/2021   HCT 38.1 04/12/2021   MCV 100 (H) 04/12/2021   PLT 197 04/12/2021   Lab Results  Component Value Date   NA 138 08/18/2021   K 4.8 08/18/2021   CO2 26 08/18/2021   GLUCOSE 105 (H) 08/18/2021   BUN 20 08/18/2021   CREATININE 0.85 08/18/2021   BILITOT 0.6 08/18/2021   ALKPHOS 89 08/18/2021   AST 30 08/18/2021   ALT 41 (H) 08/18/2021   PROT 6.5 08/18/2021   ALBUMIN 4.5 08/18/2021   CALCIUM 9.2 08/18/2021  ANIONGAP 9 05/31/2020   EGFR 72 08/18/2021   Lab Results  Component Value Date   CHOL 146 11/16/2020   Lab Results  Component Value Date   HDL 62 11/16/2020   Lab Results  Component Value Date   LDLCALC 52 11/16/2020   Lab  Results  Component Value Date   TRIG 198 (H) 11/16/2020   Lab Results  Component Value Date   CHOLHDL 2.4 11/16/2020   Lab Results  Component Value Date   HGBA1C 5.3 05/01/2019      Assessment & Plan:   Problem List Items Addressed This Visit       Cardiovascular and Mediastinum   Essential hypertension (Chronic)    -BP mildly elevated in office, ambulatory BP readings stable. -Continue current medication regimen. Will collect CMP for medication monitoring. -Encourage to continue with reducing alcohol use.      Relevant Orders   Comp Met (CMET) (Completed)   Chronic heart failure with preserved ejection fraction (HFpEF) (HCC) (Chronic)    -Followed by Cardiology.        Endocrine   Hypothyroidism (Chronic)    -Last TSH wnl, 0.998. -Continue current medication regimen. -Rechecking thyroid labs today. Pending results will make medication adjustments if indicated.       Relevant Orders   TSH (Completed)   T4, free (Completed)   T3 (Completed)     Nervous and Auditory   Insomnia due to alcohol excess and stress (Chronic)     Other   Mixed hyperlipidemia    -Will collect direct LDL. -Continue current medication regimen. Will collect CMP to monitor liver function. -Discussed reduction of alcohol use and low fat diet.      Relevant Orders   Direct LDL (Completed)   Other Visit Diagnoses     Need for shingles vaccine    -  Primary   Relevant Orders   Varicella-zoster vaccine IM (Completed)   Gastroesophageal reflux disease, unspecified whether esophagitis present       Relevant Medications   pantoprazole (PROTONIX) 40 MG tablet       GERD: -Stable. -Continue current medication regimen. Provided refill. -Recommend to avoid provocative food triggers.  Insomnia due to alcohol excess and stress: -Stable. -Continue Trazodone as needed. -Continue to work on reducing alcohol use.   Meds ordered this encounter  Medications   pantoprazole (PROTONIX)  40 MG tablet    Sig: Take 1 tablet (40 mg total) by mouth daily.    Dispense:  90 tablet    Refill:  1    Order Specific Question:   Supervising Provider    Answer:   Beatrice Lecher D [[id number]]    Follow-up: Return in about 3 months (around 11/17/2021) for [facility].    Lorrene Reid, PA-C

## 2021-08-19 LAB — COMPREHENSIVE METABOLIC PANEL
ALT: 41 IU/L — ABNORMAL HIGH (ref 0–32)
AST: 30 IU/L (ref 0–40)
Albumin/Globulin Ratio: 2.3 — ABNORMAL HIGH (ref 1.2–2.2)
Albumin: 4.5 g/dL (ref 3.7–4.7)
Alkaline Phosphatase: 89 IU/L (ref 44–121)
BUN/Creatinine Ratio: 24 (ref 12–28)
BUN: 20 mg/dL (ref 8–27)
Bilirubin Total: 0.6 mg/dL (ref 0.0–1.2)
CO2: 26 mmol/L (ref 20–29)
Calcium: 9.2 mg/dL (ref 8.7–10.3)
Chloride: 97 mmol/L (ref 96–106)
Creatinine, Ser: 0.85 mg/dL (ref 0.57–1.00)
Globulin, Total: 2 g/dL (ref 1.5–4.5)
Glucose: 105 mg/dL — ABNORMAL HIGH (ref 65–99)
Potassium: 4.8 mmol/L (ref 3.5–5.2)
Sodium: 138 mmol/L (ref 134–144)
Total Protein: 6.5 g/dL (ref 6.0–8.5)
eGFR: 72 mL/min/{1.73_m2} (ref >59)

## 2021-08-19 LAB — TSH: TSH: 0.782 u[IU]/mL (ref 0.450–4.500)

## 2021-08-19 LAB — LDL CHOLESTEROL, DIRECT: LDL Direct: 74 mg/dL (ref 0–99)

## 2021-08-19 LAB — T4, FREE: Free T4: 1.67 ng/dL (ref 0.82–1.77)

## 2021-08-19 LAB — T3: T3, Total: 59 ng/dL — ABNORMAL LOW (ref 71–180)

## 2021-08-19 NOTE — Assessment & Plan Note (Signed)
Followed by Cardiology 

## 2021-08-19 NOTE — Assessment & Plan Note (Signed)
-  BP mildly elevated in office, ambulatory BP readings stable. -Continue current medication regimen. Will collect CMP for medication monitoring. -Encourage to continue with reducing alcohol use.

## 2021-08-19 NOTE — Assessment & Plan Note (Signed)
-  Last TSH wnl, 0.998. -Continue current medication regimen. -Rechecking thyroid labs today. Pending results will make medication adjustments if indicated.

## 2021-08-19 NOTE — Assessment & Plan Note (Signed)
-  Will collect direct LDL. -Continue current medication regimen. Will collect CMP to monitor liver function. -Discussed reduction of alcohol use and low fat diet.

## 2021-08-22 ENCOUNTER — Other Ambulatory Visit: Payer: Self-pay | Admitting: Physician Assistant

## 2021-08-24 ENCOUNTER — Other Ambulatory Visit: Payer: Self-pay | Admitting: Physician Assistant

## 2021-08-24 DIAGNOSIS — Z1231 Encounter for screening mammogram for malignant neoplasm of breast: Secondary | ICD-10-CM

## 2021-08-31 ENCOUNTER — Other Ambulatory Visit: Payer: Self-pay | Admitting: Physician Assistant

## 2021-08-31 DIAGNOSIS — F10982 Alcohol use, unspecified with alcohol-induced sleep disorder: Secondary | ICD-10-CM

## 2021-09-08 ENCOUNTER — Other Ambulatory Visit: Payer: Self-pay | Admitting: Physician Assistant

## 2021-09-08 DIAGNOSIS — M549 Dorsalgia, unspecified: Secondary | ICD-10-CM

## 2021-09-08 DIAGNOSIS — G8929 Other chronic pain: Secondary | ICD-10-CM

## 2021-09-15 ENCOUNTER — Ambulatory Visit
Admission: RE | Admit: 2021-09-15 | Discharge: 2021-09-15 | Disposition: A | Discharge status: Home / Self Care | Payer: PPO | Source: Ambulatory Visit | Attending: Physician Assistant | Admitting: Physician Assistant

## 2021-09-15 ENCOUNTER — Other Ambulatory Visit: Payer: Self-pay

## 2021-09-15 DIAGNOSIS — Z1231 Encounter for screening mammogram for malignant neoplasm of breast: Secondary | ICD-10-CM | POA: Diagnosis not present

## 2021-09-15 IMAGING — MG MM DIGITAL SCREENING BILAT W/ TOMO AND CAD
8 series · 8 of 24 positions shown · non-contrast
Comparison: Previous exam(s).

ACR Breast Density Category a: The breast tissue is almost entirely
fatty.

CLINICAL DATA: Screening.

EXAM:
DIGITAL SCREENING BILATERAL MAMMOGRAM WITH TOMOSYNTHESIS AND CAD
TECHNIQUE: Bilateral screening digital craniocaudal and mediolateral oblique
mammograms were obtained. Bilateral screening digital breast
tomosynthesis was performed. The images were evaluated with
computer-aided detection.

[R CC synth-2D]
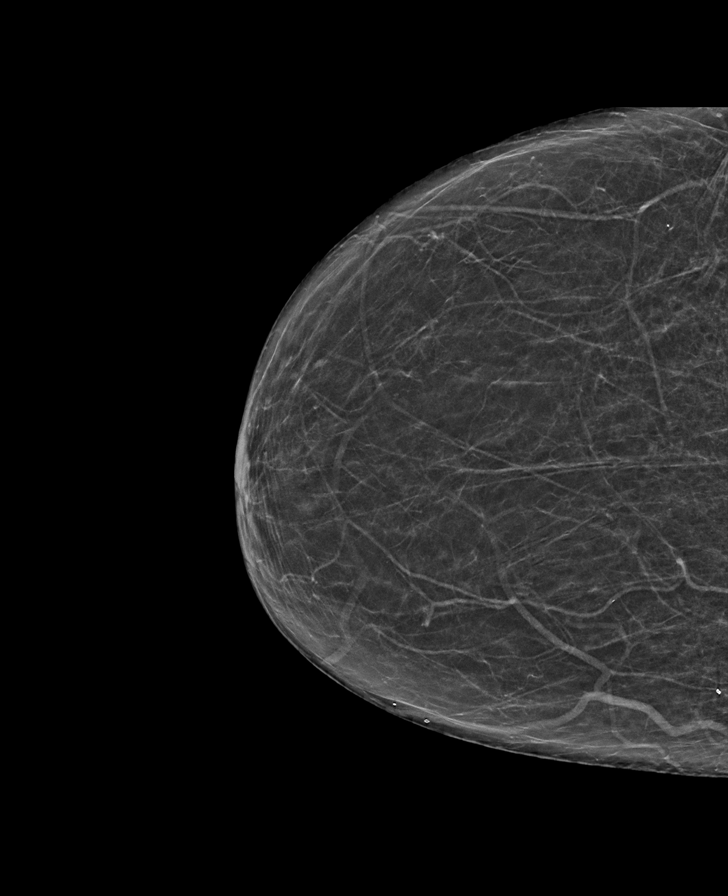

[L CC synth-2D]
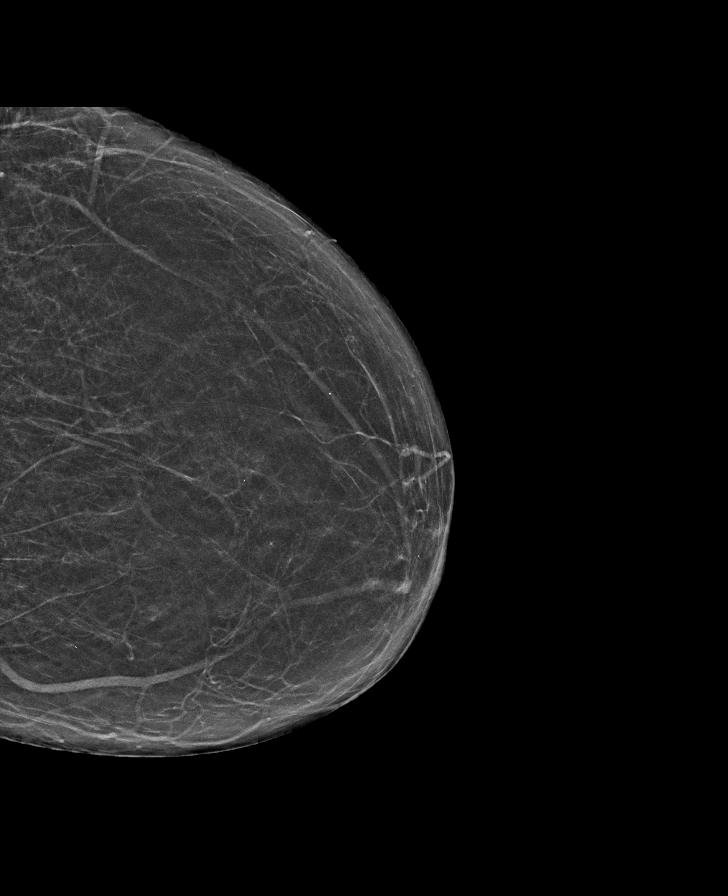

[R MLO synth-2D]
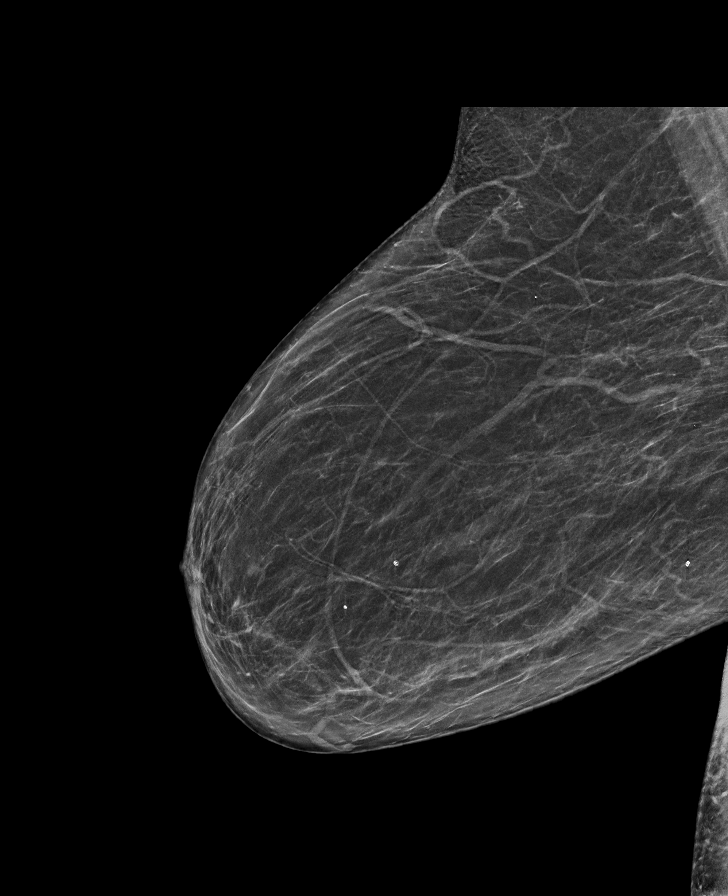

[L MLO synth-2D]
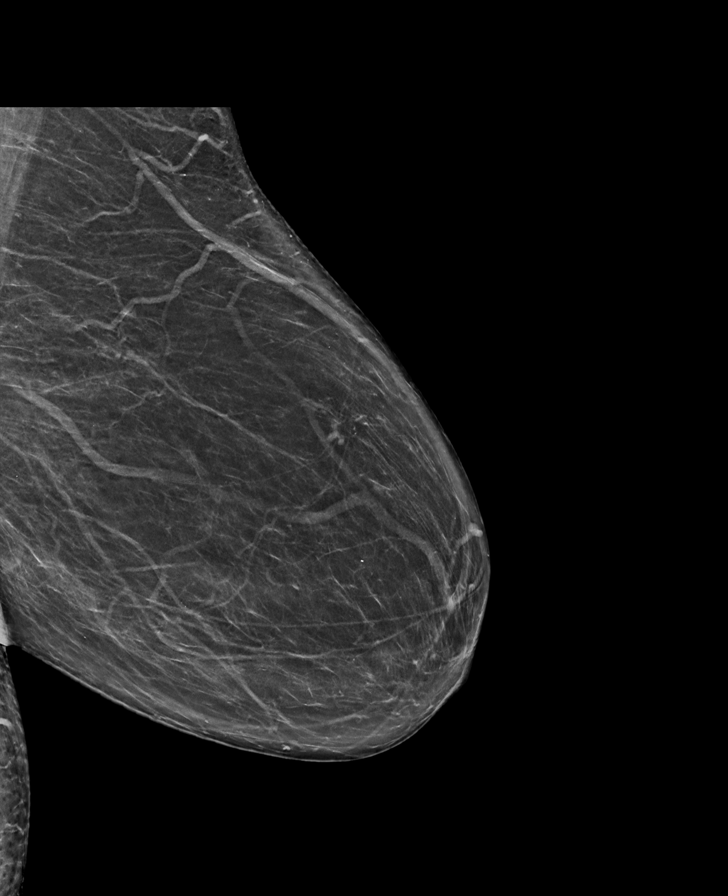

[R CC tomo · tomo slice 31/60.0]
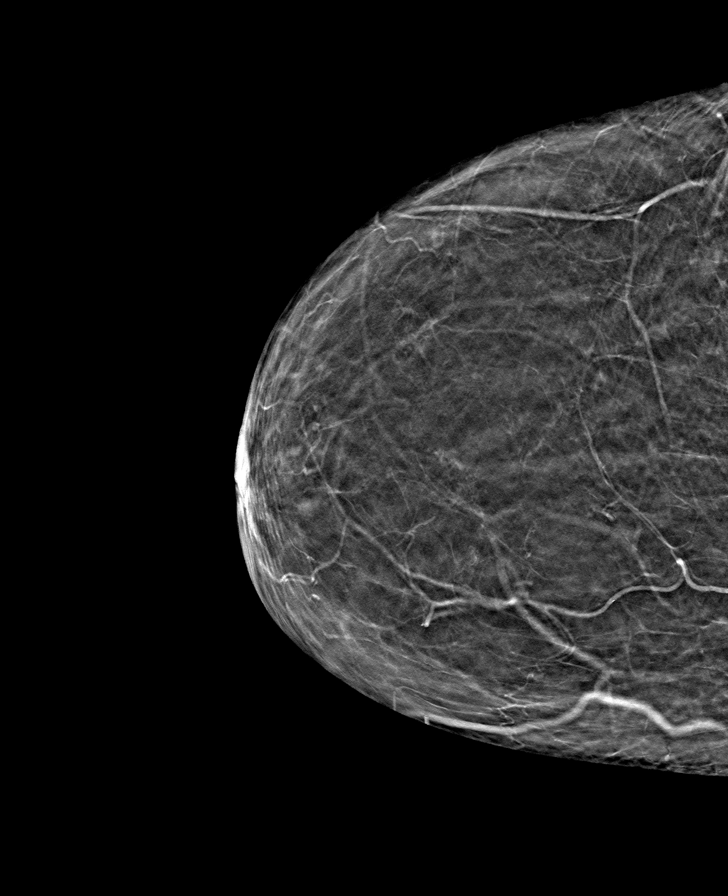

[R MLO tomo · tomo slice 37/72.0]
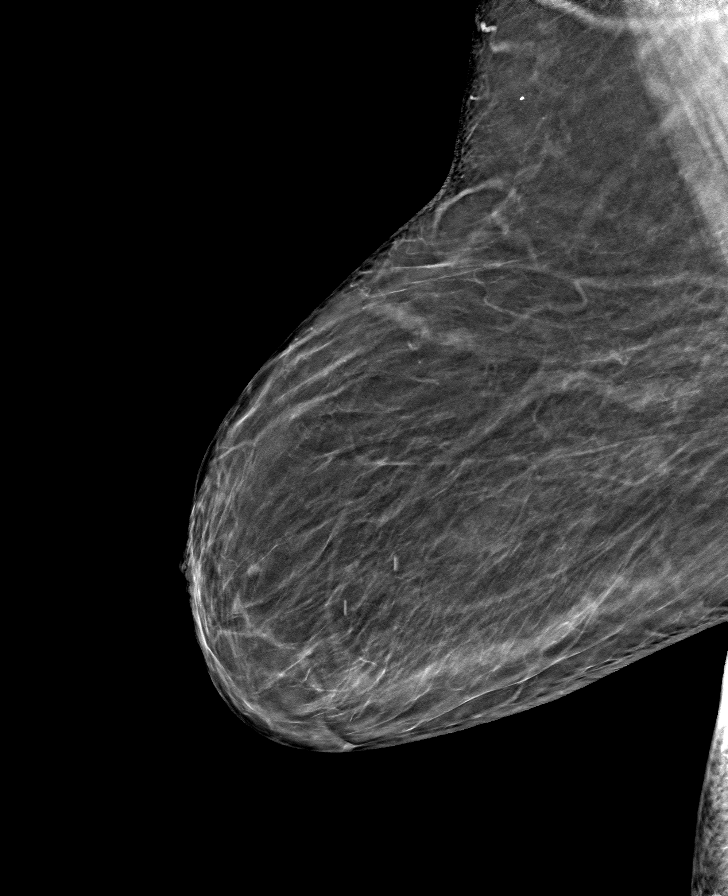

[L MLO tomo · tomo slice 36/71.0]
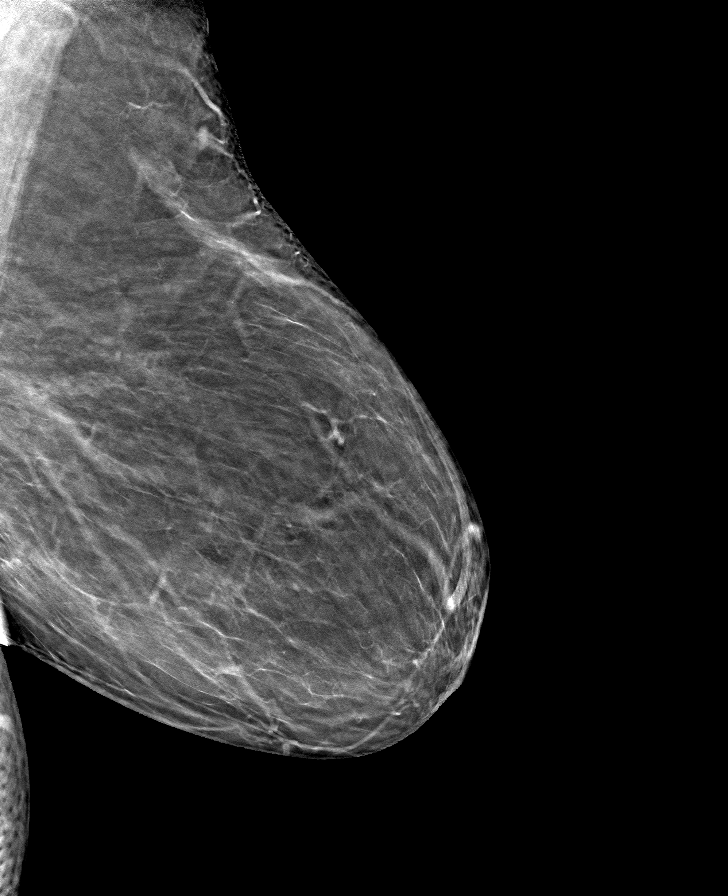

[L CC tomo · tomo slice 29/58.0]
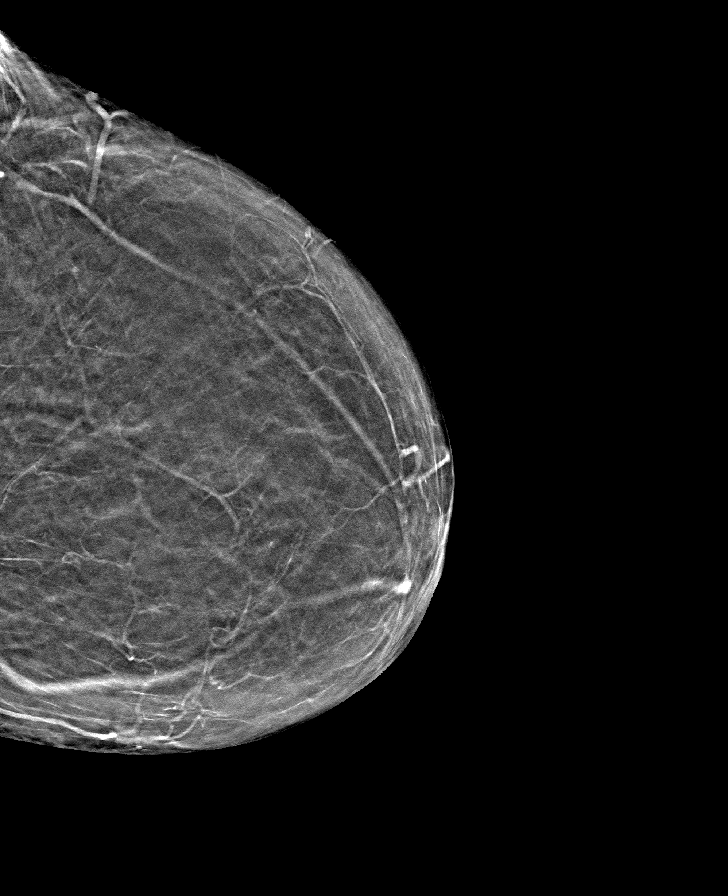

[8 of 24 positions shown; findings below may reference images not displayed]

FINDINGS: There are no findings suspicious for malignancy.
IMPRESSION: No mammographic evidence of malignancy. A result letter of this
screening mammogram will be mailed directly to the patient.

RECOMMENDATION:
Screening mammogram in one year. (Code:[8U])

BI-RADS CATEGORY  1: Negative.

## 2021-09-27 ENCOUNTER — Other Ambulatory Visit: Payer: Self-pay | Admitting: Physician Assistant

## 2021-09-27 DIAGNOSIS — E039 Hypothyroidism, unspecified: Secondary | ICD-10-CM

## 2021-10-10 ENCOUNTER — Other Ambulatory Visit: Payer: Self-pay | Admitting: Physician Assistant

## 2021-10-10 DIAGNOSIS — F10982 Alcohol use, unspecified with alcohol-induced sleep disorder: Secondary | ICD-10-CM

## 2021-10-24 ENCOUNTER — Other Ambulatory Visit: Payer: Self-pay | Admitting: Physician Assistant

## 2021-10-24 DIAGNOSIS — E782 Mixed hyperlipidemia: Secondary | ICD-10-CM

## 2021-11-07 ENCOUNTER — Other Ambulatory Visit: Payer: Self-pay

## 2021-11-07 ENCOUNTER — Other Ambulatory Visit: Payer: Self-pay | Admitting: Cardiology

## 2021-11-07 ENCOUNTER — Telehealth: Payer: Self-pay | Admitting: Physician Assistant

## 2021-11-07 DIAGNOSIS — U071 COVID-19: Secondary | ICD-10-CM

## 2021-11-07 MED ORDER — MOLNUPIRAVIR EUA 200MG CAPSULE
4.0000 | ORAL_CAPSULE | Freq: Two times a day (BID) | ORAL | 0 refills | Status: AC
Start: 2021-11-07 — End: 2021-11-12

## 2021-11-07 NOTE — Telephone Encounter (Signed)
Patient left a message stating she just tested + for covid and can anything be sent over to the pharmacy? Please advise. [phone]

## 2021-11-07 NOTE — Telephone Encounter (Signed)
Patient contacted and molnupiravir was called in for patient.  

## 2021-11-18 ENCOUNTER — Ambulatory Visit: Payer: PPO | Admitting: Physician Assistant

## 2021-11-29 ENCOUNTER — Ambulatory Visit (INDEPENDENT_AMBULATORY_CARE_PROVIDER_SITE_OTHER): Payer: PPO | Admitting: Physician Assistant

## 2021-11-29 ENCOUNTER — Encounter: Payer: Self-pay | Admitting: Physician Assistant

## 2021-11-29 ENCOUNTER — Other Ambulatory Visit: Payer: Self-pay

## 2021-11-29 VITALS — BP 161/91 | HR 74 | Ht 65.5 in | Wt 228.0 lb

## 2021-11-29 DIAGNOSIS — Z0000 Encounter for general adult medical examination without abnormal findings: Secondary | ICD-10-CM

## 2021-11-29 NOTE — Progress Notes (Signed)
Subjective:   [name] is a 76 y.o. female who presents for Medicare Annual (Subsequent) preventive examination.  Review of Systems    Refer to PCP   I connected with  Helyn App on 11/29/21 by an audio only telemedicine application and verified that I am speaking with the correct person using two identifiers.   I discussed the limitations, risks, security and privacy concerns of performing an evaluation and management service by telephone and the availability of in person appointments. I also discussed with the patient that there may be a patient responsible charge related to this service. The patient expressed understanding and verbally consented to this telephonic visit.  Location of Patient: HOME Location of Provider: OFFICE  List any persons and their role that are participating in the visit with the patient.      Objective:    Today's Vitals   11/29/21 1130  BP: (!) 161/91  Pulse: 74  Weight: 228 lb (103.4 kg)  Height: 5' 5.5" (1.664 m)   Body mass index is 37.36 kg/m.  Advanced Directives 11/25/2020 05/29/2020 05/29/2020 05/25/2020 12/04/2016 06/14/2016 04/25/2015  Does Patient Have a Medical Advance Directive? No No No No No No No  Would patient like information on creating a medical advance directive? No - Patient declined No - Patient declined - No - Patient declined (No Data) No - patient declined information No - patient declined information  Pre-existing out of facility DNR order (yellow form or pink MOST form) - - - - - - -    Current Medications (verified) Outpatient Encounter Medications as of 11/29/2021  Medication Sig   acetaminophen (TYLENOL) 500 MG tablet Take 500 mg by mouth every 6 (six) hours as needed.   benazepril-hydrochlorthiazide (LOTENSIN HCT) 20-25 MG tablet Take 1 tablet by mouth daily.   carvedilol (COREG) 6.25 MG tablet TAKE 1 TABLET BY MOUTH 2 TIMES DAILY WITH A MEAL.   Cholecalciferol (VITAMIN D3) 125 MCG (5000 UT) TABS Take 3  tablets (15,000 Units total) by mouth See admin instructions. Takes 15000 units 5 days a week and 20000 units on 2 days. (Patient taking differently: Take 15,000 Units by mouth See admin instructions. Takes 15,000 units 5 days a week and 20,000 units on 2 days.)   Cyanocobalamin (VITAMIN B-12) 2500 MCG SUBL Place 2,500 mcg under the tongue once a week.   cyclobenzaprine (FLEXERIL) 10 MG tablet TAKE 1 TABLET BY MOUTH DAILY AS NEEDED FOR MUSCLE SPASMS.   diphenhydrAMINE (BENADRYL) 25 MG tablet Take 25 mg by mouth daily as needed (Allergies).   ferrous sulfate 325 (65 FE) MG tablet Take 1 tablet (325 mg total) by mouth daily with breakfast.   furosemide (LASIX) 20 MG tablet Take 20 mg by mouth as needed. Take and additional if needed for fluid   gabapentin (NEURONTIN) 300 MG capsule TAKE 4 CAPSULES BY MOUTH EVERY MORNING, 3 CAPSULES IN THE AFTERNOON, AND 4 CAPSULES AT NIGHT   levothyroxine (SYNTHROID) 125 MCG tablet TAKE 1 TABLET (125 MCG TOTAL) BY MOUTH ONCE DAILY EVERY MORNING BEFORE BREAKFAST   meloxicam (MOBIC) 15 MG tablet Take 1 tablet (15 mg total) by mouth daily.   Mupirocin 2 % KIT Apply 1 application topically daily as needed (Wound).   pantoprazole (PROTONIX) 40 MG tablet Take 1 tablet (40 mg total) by mouth daily.   rosuvastatin (CRESTOR) 5 MG tablet TAKE 1 TABLET BY MOUTH DAILY.   traMADol (ULTRAM) 50 MG tablet TAKE 1 TABLET BY MOUTH EVERY 12 HOURS AS  NEEDED FOR PAIN. MAX 6 TABLETS PER DAY AS DIRECTED BY PRESCRIBER   traZODone (DESYREL) 50 MG tablet TAKE 1/2 TO 1 TABLET BY MOUTH AT BEDTIME AS NEEDED FOR SLEEP.   No facility-administered encounter medications on file as of 11/29/2021.    Allergies (verified) Cocoa, Red dye, Lyrica [pregabalin], Other, and Sulfa antibiotics   History: Past Medical History:  Diagnosis Date   Alcohol abuse 04/25/2015   reportedly drinks ~ 1 bottle of wine / day   Allergy    Anastomotic ulcer S/P gastric bypass 06/18/2016   Aortic atherosclerosis (HCC)     Arthritis    Cataract    Chronic diastolic heart failure ([place]) 10/25/2017   Degenerative disc disease, cervical    DVT, bilateral lower limbs ([place]) 05/2020   Age-indeterminate bilateral PERONEAL VEIN DVTs --> (was postop from acute cord compression, started on Xarelto Dosepak   Essential hypertension 04/25/2015   GAD (generalized anxiety disorder) 06/14/2016   HNP (herniated nucleus pulposus) with myelopathy, cervical 05/27/2020   Cervical Myelopathy with Spinal cord compression (HCC)  --> s/p Anterior Cervical Discectomy Decompression)    Hypothyroidism    IDA (iron deficiency anemia)    Kidney damage    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   UGIB (upper gastrointestinal bleed) 04/25/2015   Venous stasis of both lower extremities 01/10/2019   Past Surgical History:  Procedure Laterality Date   ABDOMINAL HYSTERECTOMY     ANTERIOR CERVICAL DECOMP/DISCECTOMY FUSION Left 05/26/2020   Procedure: Cervical seven - Thoracic one ANTERIOR CERVICAL DISCECTOMY AND PLATING;  Surgeon: Newman Pies, MD;  Location: [place];  Service: Neurosurgery;  Laterality: Left;   CHOLECYSTECTOMY     COSMETIC SURGERY     extra fat taken off arms and eyelid lifted   ESOPHAGOGASTRODUODENOSCOPY N/A 04/25/2015   Procedure: ESOPHAGOGASTRODUODENOSCOPY (EGD);  Surgeon: Jerene Bears, MD;  Location[facility] ENDOSCOPY;  Service: Endoscopy;  Laterality: N/A;   FRACTURE SURGERY     GASTRIC BYPASS  2003   gastric ulcer repair  04/25/2015   NM MYOVIEW LTD  10/10/2017   Normal LV size and function EF 72%.  Medium sized mild severity defect in the apical septal apical lateral and apical wall suggestive of breast attenuation.  LOW RISK.  No ischemia or infarction.     OPEN REDUCTION INTERNAL FIXATION (ORIF) DISTAL RADIAL FRACTURE Right 01/15/2013   Procedure: OPEN REDUCTION INTERNAL FIXATION (ORIF) Right DISTAL RADIUS FRACTURE;  Surgeon: Marybelle Killings, MD;  Location: [place];  Service: Orthopedics;   Laterality: Right;   SPINE SURGERY     TRANSTHORACIC ECHOCARDIOGRAM  10/04/2017   Mild LVH.  EF 60-65%.  Grade 2/moderate diastolic dysfunction.  Mild pulmonary hypertension.  No valve lesions   VENTRAL HERNIA REPAIR N/A 12/02/2020   Procedure: HERNIA REPAIR INCISIONAL  ADULT, open;  Surgeon: Jules Husbands, MD;  Location: [facility];  Service: General;  Laterality: N/A;   Family History  Problem Relation Age of Onset   Heart disease Mother    Stroke Mother 72   Cancer Mother        skin and breast   Hypertension Mother    Breast cancer Mother 10   COPD Father    Cancer Father    Diabetes Father    Hypertension Father    Stroke Maternal Grandmother    Mental illness Maternal Grandmother    Breast cancer Sister    Cancer Cousin 104   Social History   Socioeconomic History   Marital  status: Married    Spouse name: Not on file   Number of children: 4   Years of education: Not on file   Highest education level: Not on file  Occupational History   Occupation: RN    Comment: ICU Tree surgeon  Tobacco Use   Smoking status: Never   Smokeless tobacco: Never  Vaping Use   Vaping Use: Never used  Substance and Sexual Activity   Alcohol use: Yes    Alcohol/week: 28.0 standard drinks    Types: 28 Glasses of wine per week    Comment: drinks daily and has a bottle and half of wine per day   Drug use: No   Sexual activity: Yes    Birth control/protection: Post-menopausal  Other Topics Concern   Not on file  Social History Narrative   Marital status: married      Employment: ICU Tree surgeon - retired      Alcohol: drinks bottle of wine daily in 2016   Takes care of her 66 y/o mom at home   Social Determinants of Radio broadcast assistant Strain: Low Risk    Difficulty of Paying Living Expenses: Not hard at all  Food Insecurity: No Food Insecurity   Worried About Charity fundraiser in the Last Year: Never true   Arboriculturist in the Last Year: Never true   Transportation Needs: No Transportation Needs   Lack of Transportation (Medical): No   Lack of Transportation (Non-Medical): No  Physical Activity: Insufficiently Active   Days of Exercise per Week: 1 day   Minutes of Exercise per Session: 30 min  Stress: No Stress Concern Present   Feeling of Stress : Not at all  Social Connections: Moderately Isolated   Frequency of Communication with Friends and Family: More than three times a week   Frequency of Social Gatherings with Friends and Family: Once a week   Attends Religious Services: Never   Marine scientist or Organizations: No   Attends Archivist Meetings: Never   Marital Status: Married    Tobacco Counseling Counseling given: Not Answered   Diabetic? NO         Activities of Daily Living In your present state of health, do you have any difficulty performing the following activities: 11/29/2021 08/18/2021  Hearing? N Y  Vision? N Y  Difficulty concentrating or making decisions? Y N  Walking or climbing stairs? Y Y  Dressing or bathing? N N  Doing errands, shopping? Tempie Donning  Some recent data might be hidden    Patient Care Team: Lorrene Reid, PA-C as PCP - General Leonie Man, MD as PCP - Cardiology (Cardiology) Silverio Decamp, MD as Consulting Physician (Sports Medicine) Lauraine Rinne, MD as Consulting Physician (Neurology) Leonie Man, MD as Consulting Physician (Cardiology) Carol Ada, MD as Consulting Physician (Gastroenterology) [place] Comment, My [place], [place] as Referring Physician (Optometry) Ricard Dillon, MD as Consulting Physician (Internal Medicine)  Indicate any recent Medical Services you may have received from other than [facility] providers in the past year (date may be approximate).     Assessment:   This is a routine wellness examination for Sabrin.  Hearing/Vision screen No results found.  Dietary issues and exercise activities discussed: -Heart healthy diet.   Goals  Addressed   None   Depression Screen PHQ 2/9 Scores 11/29/2021 08/18/2021 04/26/2021 11/16/2020 09/23/2020 08/23/2020 06/21/2020  PHQ - 2 Score 0 0 0 0 1 0  0  PHQ- 9 Score $Remov'6 5 7 4 4 3 5    'SnNGtr$ Fall Risk Fall Risk  11/29/2021 08/18/2021 04/26/2021 11/16/2020 09/23/2020  Falls in the past year? 1 1 0 1 1  Number falls in past yr: 1 1 0 0 0  Injury with Fall? 1 0 0 0 1  Risk for fall due to : History of fall(s);Impaired balance/gait;Impaired mobility Impaired balance/gait;History of fall(s);Impaired mobility - History of fall(s) -  Follow up Falls evaluation completed Falls evaluation completed Falls evaluation completed Falls evaluation completed Falls evaluation completed    [place]:  Any stairs in or around the home? Yes  If so, are there any without handrails? Yes  Home free of loose throw rugs in walkways, pet beds, electrical cords, etc? Yes  Adequate lighting in your home to reduce risk of falls? Yes   ASSISTIVE DEVICES UTILIZED TO PREVENT FALLS:  Life alert? No  Use of a cane, walker or w/c? Yes  Grab bars in the bathroom? Yes  Shower chair or bench in shower? Yes  Elevated toilet seat or a handicapped toilet? No   TIMED UP AND GO:  Was the test performed? No .  Length of time to ambulate 10 feet:  sec.   Telehealth   Cognitive Function: wnl's      6CIT Screen 11/29/2021 01/13/2019  What Year? 0 points 0 points  What month? 0 points 0 points  What time? 0 points 0 points  Count back from 20 0 points 0 points  Months in reverse 0 points 0 points  Repeat phrase 0 points 0 points  Total Score 0 0    Immunizations Immunization History  Administered Date(s) Administered   Fluad Quad(high Dose 65+) 09/03/2019, 11/16/2020   Influenza, High Dose Seasonal PF 09/25/2017, 08/09/2021   Influenza,inj,Quad PF,6+ Mos 01/10/2017   Moderna Covid-19 Vaccine Bivalent Booster 35yrs & up 08/09/2021   Moderna Sars-Covid-2 Vaccination 08/09/2021    PFIZER(Purple Top)SARS-COV-2 Vaccination 01/10/2020, 02/04/2020, 09/01/2020, 02/26/2021   Td 11/27/2013   Tdap 12/05/2018   Zoster Recombinat (Shingrix) 12/29/2020, 08/18/2021    TDAP status: Up to date  Flu Vaccine status: Up to date  Pneumococcal vaccine status: Up to date  Covid-19 vaccine status: Completed vaccines  Qualifies for Shingles Vaccine? Yes   Zostavax completed Yes   Shingrix Completed?: Yes  Screening Tests Health Maintenance  Topic Date Due   Pneumonia Vaccine 59+ Years old (1 - PCV) Never done   Hepatitis C Screening  Never done   COVID-19 Vaccine (6 - Booster for [place] series) 10/04/2021   COLONOSCOPY (Pts 45-64yrs Insurance coverage will need to be confirmed)  04/11/2025   TETANUS/TDAP  12/05/2028   INFLUENZA VACCINE  Completed   DEXA SCAN  Completed   Zoster Vaccines- Shingrix  Completed   HPV VACCINES  Aged Out    Health Maintenance  Health Maintenance Due  Topic Date Due   Pneumonia Vaccine 15+ Years old (1 - PCV) Never done   Hepatitis C Screening  Never done   COVID-19 Vaccine (6 - Booster for [place] series) 10/04/2021    Colorectal cancer screening: Referral to GI placed today. Pt aware the office will call re: appt.  Mammogram status: Completed 2022. Repeat every year  Bone Density status: Ordered today. Pt provided with contact info and advised to call to schedule appt.  Lung Cancer Screening: (Low Dose CT Chest recommended if Age 7-80 years, 30 pack-year currently smoking OR have quit w/in  15years.) does not qualify.   Lung Cancer Screening Referral:   Additional Screening:  Hepatitis C Screening: does qualify; patient declined  Vision Screening: Recommended annual ophthalmology exams for early detection of glaucoma and other disorders of the eye. Is the patient up to date with their annual eye exam?  Yes  Who is the provider or what is the name of the office in which the patient attends annual eye exams? [facility] If pt is not  established with a provider, would they like to be referred to a provider to establish care? No .   Dental Screening: Recommended annual dental exams for proper oral hygiene  Community Resource Referral / Chronic Care Management: CRR required this visit?  No   CCM required this visit?  No      Plan:     I have personally reviewed and noted the following in the patients chart:   Medical and social history Use of alcohol, tobacco or illicit drugs  Current medications and supplements including opioid prescriptions.  Functional ability and status Nutritional status Physical activity Advanced directives List of other physicians Hospitalizations, surgeries, and ER visits in previous 12 months Vitals Screenings to include cognitive, depression, and falls Referrals and appointments  In addition, I have reviewed and discussed with patient certain preventive protocols, quality metrics, and best practice recommendations. A written personalized care plan for preventive services as well as general preventive health recommendations were provided to patient.     [place], [place]   11/29/2021   Nurse Notes: Patient declines to speak with provider and was happy to complete the Medicare visit with me.

## 2021-11-29 NOTE — Patient Instructions (Signed)
Preventive Care 65 Years and Older, Female °Preventive care refers to lifestyle choices and visits with your health care provider that can promote health and wellness. Preventive care visits are also called wellness exams. °What can I expect for my preventive care visit? °Counseling °Your health care provider may ask you questions about your: °Medical history, including: °Past medical problems. °Family medical history. °Pregnancy and menstrual history. °History of falls. °Current health, including: °Memory and ability to understand (cognition). °Emotional well-being. °Home life and relationship well-being. °Sexual activity and sexual health. °Lifestyle, including: °Alcohol, nicotine or tobacco, and drug use. °Access to firearms. °Diet, exercise, and sleep habits. °Work and work environment. °Sunscreen use. °Safety issues such as seatbelt and bike helmet use. °Physical exam °Your health care provider will check your: °Height and weight. These may be used to calculate your BMI (body mass index). BMI is a measurement that tells if you are at a healthy weight. °Waist circumference. This measures the distance around your waistline. This measurement also tells if you are at a healthy weight and may help predict your risk of certain diseases, such as type 2 diabetes and high blood pressure. °Heart rate and blood pressure. °Body temperature. °Skin for abnormal spots. °What immunizations do I need? °Vaccines are usually given at various ages, according to a schedule. Your health care provider will recommend vaccines for you based on your age, medical history, and lifestyle or other factors, such as travel or where you work. °What tests do I need? °Screening °Your health care provider may recommend screening tests for certain conditions. This may include: °Lipid and cholesterol levels. °Hepatitis C test. °Hepatitis B test. °HIV (human immunodeficiency virus) test. °STI (sexually transmitted infection) testing, if you are at  risk. °Lung cancer screening. °Colorectal cancer screening. °Diabetes screening. This is done by checking your blood sugar (glucose) after you have not eaten for a while (fasting). °Mammogram. Talk with your health care provider about how often you should have regular mammograms. °BRCA-related cancer screening. This may be done if you have a family history of breast, ovarian, tubal, or peritoneal cancers. °Bone density scan. This is done to screen for osteoporosis. °Talk with your health care provider about your test results, treatment options, and if necessary, the need for more tests. °Follow these instructions at home: °Eating and drinking ° °Eat a diet that includes fresh fruits and vegetables, whole grains, lean protein, and low-fat dairy products. Limit your intake of foods with high amounts of sugar, saturated fats, and salt. °Take vitamin and mineral supplements as recommended by your health care provider. °Do not drink alcohol if your health care provider tells you not to drink. °If you drink alcohol: °Limit how much you have to 0-1 drink a day. °Know how much alcohol is in your drink. In the [place] one drink equals one 12 oz bottle of beer (355 mL), one 5 oz glass of wine (148 mL), or one 1½ oz glass of hard liquor (44 mL). °Lifestyle °Brush your teeth every morning and night with fluoride toothpaste. Floss one time each day. °Exercise for at least 30 minutes 5 or more days each week. °Do not use any products that contain nicotine or tobacco. These products include cigarettes, chewing tobacco, and vaping devices, such as e-cigarettes. If you need help quitting, ask your health care provider. °Do not use drugs. °If you are sexually active, practice safe sex. Use a condom or other form of protection in order to prevent STIs. °Take aspirin only as told by your   health care provider. Make sure that you understand how much to take and what form to take. Work with your health care provider to find out whether it  is safe and beneficial for you to take aspirin daily. Ask your health care provider if you need to take a cholesterol-lowering medicine (statin). Find healthy ways to manage stress, such as: Meditation, yoga, or listening to music. Journaling. Talking to a trusted person. Spending time with friends and family. Minimize exposure to UV radiation to reduce your risk of skin cancer. Safety Always wear your seat belt while driving or riding in a vehicle. Do not drive: If you have been drinking alcohol. Do not ride with someone who has been drinking. When you are tired or distracted. While texting. If you have been using any mind-altering substances or drugs. Wear a helmet and other protective equipment during sports activities. If you have firearms in your house, make sure you follow all gun safety procedures. What's next? Visit your health care provider once a year for an annual wellness visit. Ask your health care provider how often you should have your eyes and teeth checked. Stay up to date on all vaccines. This information is not intended to replace advice given to you by your health care provider. Make sure you discuss any questions you have with your health care provider. Document Revised: 05/11/2021 Document Reviewed: 05/11/2021 Elsevier Patient Education  [place].

## 2021-12-14 ENCOUNTER — Ambulatory Visit: Payer: PPO | Admitting: Cardiology

## 2021-12-26 ENCOUNTER — Other Ambulatory Visit: Payer: Self-pay | Admitting: Physician Assistant

## 2021-12-26 DIAGNOSIS — F10982 Alcohol use, unspecified with alcohol-induced sleep disorder: Secondary | ICD-10-CM

## 2021-12-27 ENCOUNTER — Other Ambulatory Visit: Payer: Self-pay | Admitting: Physician Assistant

## 2021-12-27 DIAGNOSIS — E039 Hypothyroidism, unspecified: Secondary | ICD-10-CM

## 2022-01-25 ENCOUNTER — Other Ambulatory Visit: Payer: Self-pay | Admitting: Sports Medicine

## 2022-01-25 DIAGNOSIS — M170 Bilateral primary osteoarthritis of knee: Secondary | ICD-10-CM

## 2022-01-31 ENCOUNTER — Other Ambulatory Visit: Payer: Self-pay | Admitting: Physician Assistant

## 2022-01-31 DIAGNOSIS — E782 Mixed hyperlipidemia: Secondary | ICD-10-CM

## 2022-02-08 ENCOUNTER — Other Ambulatory Visit: Payer: Self-pay | Admitting: Physician Assistant

## 2022-02-08 DIAGNOSIS — I10 Essential (primary) hypertension: Secondary | ICD-10-CM

## 2022-02-21 ENCOUNTER — Other Ambulatory Visit: Payer: Self-pay | Admitting: Cardiology

## 2022-03-06 ENCOUNTER — Other Ambulatory Visit: Payer: Self-pay | Admitting: Physician Assistant

## 2022-03-06 DIAGNOSIS — K219 Gastro-esophageal reflux disease without esophagitis: Secondary | ICD-10-CM

## 2022-03-13 ENCOUNTER — Telehealth: Payer: Self-pay | Admitting: *Deleted

## 2022-03-13 ENCOUNTER — Other Ambulatory Visit: Payer: Self-pay | Admitting: Physician Assistant

## 2022-03-13 ENCOUNTER — Ambulatory Visit: Payer: PPO | Admitting: Cardiology

## 2022-03-13 DIAGNOSIS — F10982 Alcohol use, unspecified with alcohol-induced sleep disorder: Secondary | ICD-10-CM

## 2022-03-13 NOTE — Telephone Encounter (Signed)
Spoke to patient 's husband  - appointment moved to 03/15/22  at 12:20 pm  ?

## 2022-03-15 ENCOUNTER — Encounter: Payer: Self-pay | Admitting: Cardiology

## 2022-03-15 ENCOUNTER — Ambulatory Visit: Payer: PPO | Admitting: Cardiology

## 2022-03-15 VITALS — BP 130/78 | HR 67 | Ht 65.5 in | Wt 229.2 lb

## 2022-03-15 DIAGNOSIS — E781 Pure hyperglyceridemia: Secondary | ICD-10-CM

## 2022-03-15 DIAGNOSIS — F1010 Alcohol abuse, uncomplicated: Secondary | ICD-10-CM

## 2022-03-15 DIAGNOSIS — I5032 Chronic diastolic (congestive) heart failure: Secondary | ICD-10-CM | POA: Diagnosis not present

## 2022-03-15 DIAGNOSIS — I10 Essential (primary) hypertension: Secondary | ICD-10-CM | POA: Diagnosis not present

## 2022-03-15 DIAGNOSIS — E782 Mixed hyperlipidemia: Secondary | ICD-10-CM | POA: Diagnosis not present

## 2022-03-15 DIAGNOSIS — I878 Other specified disorders of veins: Secondary | ICD-10-CM | POA: Diagnosis not present

## 2022-03-15 NOTE — Progress Notes (Signed)
Primary Care Provider: Mayer Masker, PA-C Cardiologist: Bryan Lemma, MD Electrophysiologist: None  Clinic Note: Chief Complaint  Patient presents with   Follow-up    Stable   Congestive Heart Failure    Chronic diastolic-really difficult to assess volume, but seems to be relatively stable.   ===================================  ASSESSMENT/PLAN   Problem List Items Addressed This Visit       Cardiology Problems   Chronic heart failure with preserved ejection fraction (HFpEF) (HCC) (Chronic)    I truthfully do not know how much of her symptoms are actually related to heart failure.  Her swelling is probably more varicose vein and venous stasis related.  She does not really have any PND orthopnea.  She does have exertional dyspnea but is extremely deconditioned, obese and almost immobile.  She is on beta-blocker and ACE inhibitor and HCTZ as well as Lasix appropriately.  Would potentially consider evaluation for sleep apnea.       Relevant Orders   EKG 12-Lead (Completed)   Essential hypertension - Primary (Chronic)    Borderline pressures here today.  For now we will continue current doses of carvedilol 6.25 mg twice daily, Benicar-HCTZ 20-25 mg  Continue standing dose of Lasix-need to monitor chemistry panel closely.  Needs cut down alcohol use.       Relevant Orders   EKG 12-Lead (Completed)   Hypertriglyceridemia (Chronic)    Cut down alcohol.  Continue statin.  Suggested fish oil, would otherwise want to consider Vascepa.  But would need to see if the alcohol level is been reduced.       Mixed hyperlipidemia (Chronic)    She had labs checked in September with LDL direct measured at 74, but all the other labs are not available.  This would suggest worsening LDL than the 52 noted in December 2021.  Continue to monitor but closely monitor liver function as well given her alcohol use.  Needs to cut back alcohol and adjust diet. Triglycerides also  probably driving up the LDL.       Venous stasis of both lower extremities (Chronic)    Left greater than right swelling.  Diffuse venous stasis changes.  This would argue that a lot of the edema is not heart failure related.  Plan: Continue support stockings along with foot elevation.  I stressed the importance of not locking out her knees.  She needs to elevate her feet during the course of the day as well as at night further of her head.  Continue current dose of diuretic with additional dosing as necessary.         Other   Alcohol abuse-with elevated ALT and AST (Chronic)    She has had issues with elevated LFTs in the past.  Still drinking quite a bit of wine.  Needs to cut that down.       ===================================  HPI:    [name] is a morbidly obese 76 y.o. female with a PMH below who presents today for 75-month follow-up at the request of [name], [name], PA-C.  PMH:  Chronic HF PEF with chronic LE edema complicated by venous insufficiency,  HTN, HLD,  obesity (h/o GOP),  EtOH ABUSE (by report drinks a bottle of wine a day),  h/o GIB   [name] was last seen on August 16, 2020.  She is feeling better from a dyspnea standpoint.  Edema was also well improved.  Weight was down and she had diuresed quite a bit (still  L>R LE swelling) he probably related to previous injury.  Notably improved exertional dyspnea working with physical therapy.  No PND orthopnea.  Trying to wear support stockings as best as possible.  Getting stronger postoperatively, still fatigued.  Was able to maintain weight loss from diuresis.  Still short of breath with mild swelling.  Neck and back pain and diffuse joint pain.  Her weight had gone down as far as 222 pounds at home. Recommended continued 20 mg Lasix standing dose with additional 20 mg for 3 pound weight gain per sliding scale Considered ncreasing to 12.5 mg twice daily carvedilol along with  benazepril-HCTZ => plan was to check a BP log.  (Home BP checks are better than in the office) Continue support stockings after left leg wound healed.  Elevate feet when possible.  Recent Hospitalizations: None  Reviewed  CV studies:    The following studies were reviewed today: (if available, images/films reviewed: From Epic Chart or Care Everywhere) None:   Interval History:   [name] returns for delayed follow-up here with her husband.  She seems to be doing okay.  She still wears the TED hose and has a left greater than right swelling.  She has maintained relatively stable weight may be once every 1 to 2 weeks she will take an extra dose of Lasix for edema.  She is not able to lie flat because of back pain so is difficult to tell if she has any PND or orthopnea.  No chest pain or pressure at rest or exertion.  She does not exert herself much.  No real sensation of rapid irregular beats or palpitations. Still easily fatigued.  CV Review of Symptoms (Summary) Cardiovascular ROS: positive for - dyspnea on exertion, edema, and stable.  DOE related to deconditioning and obesity.  Lack of mobility. negative for - chest pain, irregular heartbeat, palpitations, rapid heart rate, shortness of breath, or syncope or near syncope, TIA or amaurosis fugax.  Unable to assess for PND or orthopnea because of body habitus and back pain making her unable to lie flat.  REVIEWED OF SYSTEMS   Review of Systems  Constitutional:  Positive for malaise/fatigue (Not a lot of energy.  Somewhat sedentary.). Negative for weight loss (Not really.  The been pretty stable).  HENT:  Negative for nosebleeds.   Respiratory:  Positive for shortness of breath (Per HPI) and wheezing (With allergies). Negative for cough.   Cardiovascular:  Positive for leg swelling (Per HPI).  Gastrointestinal:  Negative for blood in stool and melena.  Genitourinary:  Positive for frequency. Negative for dysuria and  hematuria.  Musculoskeletal:  Positive for back pain (Limits mobility, unable to lie flat.), joint pain and neck pain.  Neurological:  Positive for dizziness (If she gets up too quickly). Negative for focal weakness and weakness.  Psychiatric/Behavioral:  Negative for depression and memory loss. The patient is nervous/anxious. The patient does not have insomnia.    I have reviewed and (if needed) personally updated the patient's problem list, medications, allergies, past medical and surgical history, social and family history.   PAST MEDICAL HISTORY   Past Medical History:  Diagnosis Date   Alcohol abuse 04/25/2015   reportedly drinks ~ 1 bottle of wine / day   Allergy    Anastomotic ulcer S/P gastric bypass 06/18/2016   Aortic atherosclerosis (HCC)    Arthritis    Cataract    Chronic diastolic heart failure (HCC) 10/25/2017   Degenerative disc disease, cervical  DVT, bilateral lower limbs ([place]) 05/2020   Age-indeterminate bilateral PERONEAL VEIN DVTs --> (was postop from acute cord compression, started on Xarelto Dosepak   Essential hypertension 04/25/2015   GAD (generalized anxiety disorder) 06/14/2016   HNP (herniated nucleus pulposus) with myelopathy, cervical 05/27/2020   Cervical Myelopathy with Spinal cord compression (HCC)  --> s/p Anterior Cervical Discectomy Decompression)    Hypothyroidism    IDA (iron deficiency anemia)    Kidney damage    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   UGIB (upper gastrointestinal bleed) 04/25/2015   Venous stasis of both lower extremities 01/10/2019    PAST SURGICAL HISTORY   Past Surgical History:  Procedure Laterality Date   ABDOMINAL HYSTERECTOMY     ANTERIOR CERVICAL DECOMP/DISCECTOMY FUSION Left 05/26/2020   Procedure: Cervical seven - Thoracic one ANTERIOR CERVICAL DISCECTOMY AND PLATING;  Surgeon: Newman Pies, MD;  Location: [facility];  Service: Neurosurgery;  Laterality: Left;   CHOLECYSTECTOMY      COSMETIC SURGERY     extra fat taken off arms and eyelid lifted   ESOPHAGOGASTRODUODENOSCOPY N/A 04/25/2015   Procedure: ESOPHAGOGASTRODUODENOSCOPY (EGD);  Surgeon: Jerene Bears, MD;  Location: [facility] ENDOSCOPY;  Service: Endoscopy;  Laterality: N/A;   FRACTURE SURGERY     GASTRIC BYPASS  2003   gastric ulcer repair  04/25/2015   NM MYOVIEW LTD  10/10/2017   Normal LV size and function EF 72%.  Medium sized mild severity defect in the apical septal apical lateral and apical wall suggestive of breast attenuation.  LOW RISK.  No ischemia or infarction.     OPEN REDUCTION INTERNAL FIXATION (ORIF) DISTAL RADIAL FRACTURE Right 01/15/2013   Procedure: OPEN REDUCTION INTERNAL FIXATION (ORIF) Right DISTAL RADIUS FRACTURE;  Surgeon: Marybelle Killings, MD;  Location: [facility];  Service: Orthopedics;  Laterality: Right;   SPINE SURGERY     TRANSTHORACIC ECHOCARDIOGRAM  10/04/2017   Mild LVH.  EF 60-65%.  Grade 2/moderate diastolic dysfunction.  Mild pulmonary hypertension.  No valve lesions   VENTRAL HERNIA REPAIR N/A 12/02/2020   Procedure: HERNIA REPAIR INCISIONAL  ADULT, open;  Surgeon: Jules Husbands, MD;  Location: [facility];  Service: General;  Laterality: N/A;    Immunization History  Administered Date(s) Administered   Fluad Quad(high Dose 65+) 09/03/2019, 11/16/2020   Influenza, High Dose Seasonal PF 09/25/2017, 08/09/2021   Influenza,inj,Quad PF,6+ Mos 01/10/2017   Moderna Covid-19 Vaccine Bivalent Booster 77yrs & up 08/09/2021   Moderna Sars-Covid-2 Vaccination 08/09/2021   PFIZER(Purple Top)SARS-COV-2 Vaccination 01/10/2020, 02/04/2020, 09/01/2020, 02/26/2021   Td 11/27/2013   Tdap 12/05/2018   Zoster Recombinat (Shingrix) 12/29/2020, 08/18/2021    MEDICATIONS/ALLERGIES   Current Meds  Medication Sig   acetaminophen (TYLENOL) 500 MG tablet Take 500 mg by mouth every 6 (six) hours as needed.   benazepril-hydrochlorthiazide (LOTENSIN HCT) 20-25 MG tablet TAKE 1 TABLET BY MOUTH DAILY.   carvedilol  (COREG) 6.25 MG tablet Take 1 tablet (6.25 mg total) by mouth 2 (two) times daily with a meal. Please keep upcoming appt for future refills   Cholecalciferol (VITAMIN D3) 125 MCG (5000 UT) TABS Take 3 tablets (15,000 Units total) by mouth See admin instructions. Takes 15000 units 5 days a week and 20000 units on 2 days. (Patient taking differently: Take 15,000 Units by mouth See admin instructions. Takes 15,000 units 5 days a week and 20,000 units on 2 days.)   Cyanocobalamin (VITAMIN B-12) 2500 MCG SUBL Place 2,500 mcg under the tongue once a week.  cyclobenzaprine (FLEXERIL) 10 MG tablet TAKE 1 TABLET BY MOUTH DAILY AS NEEDED FOR MUSCLE SPASMS.   diphenhydrAMINE (BENADRYL) 25 MG tablet Take 25 mg by mouth daily as needed (Allergies).   ferrous sulfate 325 (65 FE) MG tablet Take 1 tablet (325 mg total) by mouth daily with breakfast.   furosemide (LASIX) 20 MG tablet Take 20 mg by mouth as needed. Take and additional if needed for fluid   levothyroxine (SYNTHROID) 125 MCG tablet TAKE 1 TABLET (125 MCG TOTAL) BY MOUTH ONCE DAILY EVERY MORNING BEFORE BREAKFAST   meloxicam (MOBIC) 15 MG tablet Take 1 tablet (15 mg total) by mouth daily.   Mupirocin 2 % KIT Apply 1 application topically daily as needed (Wound).   pantoprazole (PROTONIX) 40 MG tablet TAKE 1 TABLET (40 MG TOTAL) BY MOUTH DAILY.   rosuvastatin (CRESTOR) 5 MG tablet TAKE 1 TABLET BY MOUTH DAILY.   traMADol (ULTRAM) 50 MG tablet TAKE 1 TABLET BY MOUTH EVERY 12 HOURS AS NEEDED FOR PAIN. MAX 6 TABLETS PER DAY AS DIRECTED BY PRESCRIBER   traZODone (DESYREL) 50 MG tablet TAKE 1/2 TO 1 TABLET BY MOUTH AT BEDTIME AS NEEDED FOR SLEEP.   [DISCONTINUED] gabapentin (NEURONTIN) 300 MG capsule TAKE 4 CAPSULES BY MOUTH EVERY MORNING, 3 CAPSULES IN THE AFTERNOON, AND 4 CAPSULES AT NIGHT    Allergies  Allergen Reactions   Cocoa Anaphylaxis   Red Dye Hives    Rash and Nausea diarrhea   Lyrica [Pregabalin] Other (See Comments)    Abnormal Nose  bleeding   Other Other (See Comments)    Berries : Rash   Sulfa Antibiotics Rash    SOCIAL HISTORY/FAMILY HISTORY   Reviewed in Epic:  Pertinent findings:  Social History   Tobacco Use   Smoking status: Never   Smokeless tobacco: Never  Vaping Use   Vaping Use: Never used  Substance Use Topics   Alcohol use: Yes    Alcohol/week: 28.0 standard drinks    Types: 28 Glasses of wine per week    Comment: drinks daily and has a bottle and half of wine per day   Drug use: No   Social History   Social History Narrative   Marital status: married      Employment: ICU Tree surgeon - retired      Alcohol: drinks bottle of wine daily in 2016   Takes care of her 22 y/o mom at home    OBJCTIVE -PE, EKG, labs   Wt Readings from Last 3 Encounters:  03/15/22 229 lb 3.2 oz (104 kg)  11/29/21 228 lb (103.4 kg)  08/18/21 226 lb (102.5 kg)    Physical Exam: BP 130/78   Pulse 67   Ht 5' 5.5" (1.664 m)   Wt 229 lb 3.2 oz (104 kg)   SpO2 98%   BMI 37.56 kg/m  Physical Exam Vitals reviewed.  Constitutional:      General: She is not in acute distress.    Appearance: She is obese. She is ill-appearing (Chronic). She is not toxic-appearing.     Comments: Essentially morbidly obese.  Chronically ill-appearing.  In a wheelchair.  Very immobile  Neck:     Vascular: No carotid bruit, hepatojugular reflux or JVD.  Cardiovascular:     Rate and Rhythm: Normal rate and regular rhythm. No extrasystoles are present.    Chest Wall: PMI is not displaced (Unable to palpate due to body habitus).     Pulses: Decreased pulses (Unable to palpate due to  body habitus, and obesity.).     Heart sounds: S1 normal and S2 normal. Heart sounds are distant. Murmur (2/6 SEM at RUSB) heard.    No friction rub. No gallop.  Pulmonary:     Effort: Pulmonary effort is normal. No respiratory distress.     Breath sounds: No wheezing, rhonchi or rales.     Comments: Diffuse interstitial sounds with mild basilar  crackles.  No rales or rhonchi. Chest:     Chest wall: No tenderness.  Abdominal:     Comments: Truncal obesity.  Exam difficult.  Musculoskeletal:     Cervical back: Decreased range of motion.     Right lower leg: Edema (1+ up to the knee) present.     Left lower leg: Edema (2+ up to the knee) present.     Comments: Wear support stockings with toes cut out.    Skin:    General: Skin is warm and dry.     Comments: Lower extremities: Skin is purple and violaceous venous stasis changes, varicose veins and spider veins.  Neurological:     General: No focal deficit present.     Mental Status: She is oriented to person, place, and time.     Motor: Weakness present.     Comments: In a wheelchair because of immobility.  Psychiatric:        Mood and Affect: Mood normal.        Behavior: Behavior normal.        Thought Content: Thought content normal.        Judgment: Judgment normal.    Adult ECG Report  Rate: 67 ;  Rhythm: normal sinus rhythm and normal axis, intervals, and durations. ;   Narrative Interpretation: Stable  Recent Labs: Reviewed  Lab Results  Component Value Date   CHOL 146 11/16/2020   HDL 62 11/16/2020   LDLCALC 52 11/16/2020   LDLDIRECT 74 08/18/2021   TRIG 198 (H) 11/16/2020   CHOLHDL 2.4 11/16/2020   Lab Results  Component Value Date   CREATININE 0.85 08/18/2021   BUN 20 08/18/2021   NA 138 08/18/2021   K 4.8 08/18/2021   CL 97 08/18/2021   CO2 26 08/18/2021      Latest Ref Rng & Units 04/12/2021   10:25 AM 10/15/2020   10:09 AM 06/07/2020    8:24 AM  CBC  WBC 3.4 - 10.8 x10E3/uL 5.6   6.2   10.8    Hemoglobin 11.1 - 15.9 g/dL 13.0   13.5   11.6    Hematocrit 34.0 - 46.6 % 38.1   38.9   36.2    Platelets 150 - 450 x10E3/uL 197   234   324      Lab Results  Component Value Date   HGBA1C 5.3 05/01/2019   Lab Results  Component Value Date   TSH 0.782 08/18/2021    ==================================================  COVID-19  Education: The signs and symptoms of COVID-19 were discussed with the patient and how to seek care for testing (follow up with PCP or arrange E-visit).    I spent a total of 25 minutes with the patient spent in direct patient consultation.  Additional time spent with chart review  / charting (studies, outside notes, etc): 16 min Total Time: 41 min  Current medicines are reviewed at length with the patient today.  (+/- concerns) N/A  Notice: This dictation was prepared with Dragon dictation along with smart phrase technology. Any transcriptional errors that result from this  process are unintentional and may not be corrected upon review.  Studies Ordered:   Orders Placed This Encounter  Procedures   EKG 12-Lead  No orders of the defined types were placed in this encounter.   Patient Instructions / Medication Changes & Studies & Tests Ordered   Patient Instructions  Medication Instructions:  No changes  *If you need a refill on your cardiac medications before your next appointment, please call your pharmacy*   Lab Work: Not needed If you have labs (blood work) drawn today and your tests are completely normal, you will receive your results only by: MyChart Message (if you have MyChart) OR A paper copy in the mail If you have any lab test that is abnormal or we need to change your treatment, we will call you to review the results.   Testing/Procedures:  Not needed  Follow-Up: At [facility], you and your health needs are our priority.  As part of our continuing mission to provide you with exceptional heart care, we have created designated Provider Care Teams.  These Care Teams include your primary Cardiologist (physician) and Advanced Practice Providers (APPs -  Physician Assistants and Nurse Practitioners) who all work together to provide you with the care you need, when you need it.     Your next appointment:   12 month(s)  The format for your next appointment:   In  Person  Provider:   Glenetta Hew, MD     Glenetta Hew, M.D., M.S. Interventional Cardiologist   Pager # [phone] Phone # [phone] [address]. [place], [place] [address]   Thank you for choosing Heartcare at [facility]!!

## 2022-03-15 NOTE — Patient Instructions (Signed)
Medication Instructions:  No changes  *If you need a refill on your cardiac medications before your next appointment, please call your pharmacy*   Lab Work: Not needed If you have labs (blood work) drawn today and your tests are completely normal, you will receive your results only by: . MyChart Message (if you have MyChart) OR . A paper copy in the mail If you have any lab test that is abnormal or we need to change your treatment, we will call you to review the results.   Testing/Procedures:  Not needed  Follow-Up: At [facility], you and your health needs are our priority.  As part of our continuing mission to provide you with exceptional heart care, we have created designated Provider Care Teams.  These Care Teams include your primary Cardiologist (physician) and Advanced Practice Providers (APPs -  Physician Assistants and Nurse Practitioners) who all work together to provide you with the care you need, when you need it.   Your next appointment:   12 month(s)  The format for your next appointment:   In Person  Provider:   David Harding, MD  

## 2022-04-12 ENCOUNTER — Other Ambulatory Visit: Payer: Self-pay | Admitting: Physician Assistant

## 2022-04-17 ENCOUNTER — Encounter: Payer: Self-pay | Admitting: Cardiology

## 2022-04-17 NOTE — Assessment & Plan Note (Signed)
She has had issues with elevated LFTs in the past.  Still drinking quite a bit of wine.  Needs to cut that down.

## 2022-04-17 NOTE — Assessment & Plan Note (Signed)
Left greater than right swelling.  Diffuse venous stasis changes.  This would argue that a lot of the edema is not heart failure related.  Plan: Continue support stockings along with foot elevation.  I stressed the importance of not locking out her knees.  She needs to elevate her feet during the course of the day as well as at night further of her head.  Continue current dose of diuretic with additional dosing as necessary.

## 2022-04-17 NOTE — Assessment & Plan Note (Signed)
>>  ASSESSMENT AND PLAN FOR VENOUS STASIS OF BOTH LOWER EXTREMITIES WRITTEN ON 04/17/2022 12:16 AM BY HARDING, DAVID W, MD  Left greater than right swelling.  Diffuse venous stasis changes.  This would argue that a lot of the edema is not heart failure related.  Plan: Continue support stockings along with foot elevation.  I stressed the importance of not locking out her knees.  She needs to elevate her feet during the course of the day as well as at night further of her head.  Continue current dose of diuretic with additional dosing as necessary.

## 2022-04-17 NOTE — Assessment & Plan Note (Signed)
Borderline pressures here today.  For now we will continue current doses of carvedilol 6.25 mg twice daily, Benicar-HCTZ 20-25 mg  Continue standing dose of Lasix-need to monitor chemistry panel closely.  Needs cut down alcohol use.

## 2022-04-17 NOTE — Assessment & Plan Note (Addendum)
Cut down alcohol.  Continue statin.  Suggested fish oil, would otherwise want to consider Vascepa.  But would need to see if the alcohol level is been reduced.

## 2022-04-17 NOTE — Assessment & Plan Note (Signed)
I truthfully do not know how much of her symptoms are actually related to heart failure.  Her swelling is probably more varicose vein and venous stasis related.  She does not really have any PND orthopnea.  She does have exertional dyspnea but is extremely deconditioned, obese and almost immobile.  She is on beta-blocker and ACE inhibitor and HCTZ as well as Lasix appropriately.  Would potentially consider evaluation for sleep apnea.

## 2022-04-17 NOTE — Assessment & Plan Note (Addendum)
>>  ASSESSMENT AND PLAN FOR MIXED HYPERLIPIDEMIA WRITTEN ON 04/17/2022 12:19 AM BY Khale Nigh W, MD  She had labs checked in September with LDL direct measured at 74, but all the other labs are not available.  This would suggest worsening LDL than the 52 noted in December 2021.  Continue to monitor but closely monitor liver function as well given her alcohol use.  Needs to cut back alcohol and adjust diet. Triglycerides also probably driving up the LDL.  >>ASSESSMENT AND PLAN FOR HYPERTRIGLYCERIDEMIA WRITTEN ON 04/17/2022 12:18 AM BY Jae Skeet W, MD  Cut down alcohol.  Continue statin.  Suggested fish oil, would otherwise want to consider Vascepa.  But would need to see if the alcohol level is been reduced.

## 2022-05-02 ENCOUNTER — Ambulatory Visit (INDEPENDENT_AMBULATORY_CARE_PROVIDER_SITE_OTHER): Payer: PPO | Admitting: Sports Medicine

## 2022-05-02 ENCOUNTER — Ambulatory Visit (INDEPENDENT_AMBULATORY_CARE_PROVIDER_SITE_OTHER): Payer: PPO

## 2022-05-02 ENCOUNTER — Telehealth: Payer: Self-pay | Admitting: Sports Medicine

## 2022-05-02 DIAGNOSIS — M170 Bilateral primary osteoarthritis of knee: Secondary | ICD-10-CM

## 2022-05-02 NOTE — Progress Notes (Signed)
    Procedures performed today:    Procedure: Real-time Ultrasound Guided aspiration/injection of right knee Device: Samsung HS60  Verbal informed consent obtained.  Time-out conducted.  Noted no overlying erythema, induration, or other signs of local infection.  Skin prepped in a sterile fashion.  Local anesthesia: Topical Ethyl chloride.  With sterile technique and under real time ultrasound guidance: Noted effusion, aspirated 15 mils of clear, straw-colored fluid, syringe switched and 1 cc Kenalog 40, 2 cc lidocaine, 2 cc bupivacaine injected easily Completed without difficulty  Advised to call if fevers/chills, erythema, induration, drainage, or persistent bleeding.  Images permanently stored and available for review in PACS.  Impression: Technically successful ultrasound guided aspiration/injection.  Independent interpretation of notes and tests performed by another provider:   None.  Brief History, Exam, Impression, and Recommendations:    Primary osteoarthritis of both knees Pleasant 76 year old female, knee osteoarthritis, worsening of pain right knee, last injections were in September 2022. Increasing swelling and pain right knee, aspiration and injection today. We will also work on getting her approved for viscosupplementation. Willing to pay OOP if not approved.    ___________________________________________ Ihor Austin. Benjamin Stain, M.D., ABFM., CAQSM. Primary Care and Sports Medicine [facility]  Adjunct Instructor of Family Medicine  [employer]

## 2022-05-02 NOTE — Assessment & Plan Note (Addendum)
Pleasant 76 year old female, knee osteoarthritis, worsening of pain right knee, last injections were in September 2022. Increasing swelling and pain right knee, aspiration and injection today. We will also work on getting her approved for viscosupplementation. Willing to pay OOP if not approved.

## 2022-05-02 NOTE — Telephone Encounter (Signed)
[employer] approval please, right knee osteoarthritis, x-ray confirmed. Failed greater than 6-week conservative treatment, oral analgesics, injections.

## 2022-05-04 NOTE — Telephone Encounter (Signed)
[employer] paperwork  along with copies of insurance cards faxed to [employer] at [phone] Request is for [employer] prefers [facility] Fax confirmation receipt received

## 2022-05-11 NOTE — Telephone Encounter (Signed)
Benefits Investigation Details received from [employer]: [employer]  Medical: Deductible does not apply. Once the OOP has been met, patient is covered at 100%. Only one copay applies per date of service.  PA required: No  Pharmacy: Due to 3rd party restrictions with RX advance, we are unable to obtain coverage info.   Specialty Pharmacy: [facility]  May fill through: Sharyn Lull and [place] Copay/Coinsurance:  Product Copay: 20% ($ 140) Administration Coinsurance:  Administration Copay: $25 Deductible: Out of Pocket Max: $3200 (met: $66.87)    Approximate weekly cost is $165.   Left msg for a return call from patient.

## 2022-05-15 ENCOUNTER — Other Ambulatory Visit: Payer: Self-pay | Admitting: Physician Assistant

## 2022-05-15 DIAGNOSIS — E782 Mixed hyperlipidemia: Secondary | ICD-10-CM

## 2022-05-16 ENCOUNTER — Ambulatory Visit (INDEPENDENT_AMBULATORY_CARE_PROVIDER_SITE_OTHER): Payer: PPO | Admitting: Physician Assistant

## 2022-05-16 ENCOUNTER — Encounter: Payer: Self-pay | Admitting: Physician Assistant

## 2022-05-16 VITALS — BP 134/82 | HR 65 | Ht 65.0 in | Wt 227.0 lb

## 2022-05-16 DIAGNOSIS — M792 Neuralgia and neuritis, unspecified: Secondary | ICD-10-CM

## 2022-05-16 DIAGNOSIS — R296 Repeated falls: Secondary | ICD-10-CM

## 2022-05-16 DIAGNOSIS — R55 Syncope and collapse: Secondary | ICD-10-CM

## 2022-05-16 DIAGNOSIS — G629 Polyneuropathy, unspecified: Secondary | ICD-10-CM

## 2022-05-16 NOTE — Progress Notes (Signed)
Established patient acute visit   Patient: [name]   DOB: 1946/03/17   76 y.o. Female  MRN: [id number] Visit Date: 05/16/2022  Chief Complaint  Patient presents with   Acute Visit    Patient has had 3 falls in this year. Last fall x 1 week. She is blacking out. Patient denies chest pain, she feels that a EKG is needed. She feels that her neuropathy is worse.  BP sitting 145/74       Standing 133/83   Subjective    HPI HPI     Acute Visit    Additional comments: Patient has had 3 falls in this year. Last fall x 1 week. She is blacking out. Patient denies chest pain, she feels that a EKG is needed. She feels that her neuropathy is worse.  BP sitting 145/74       Standing 133/83      Last edited by Pennelope Bracken, CMA on 05/16/2022 10:48 AM.      Patient presents with c/o recurrent falls. Reports 1 week ago was going from the kitchen to the living room and what she recalls was being on the floor. Pt denies tripping or losing her balance. Patient unsure if she lost consciousness. Patient's husband was not present during the last encounter. No chest pain, palpitations, shortness of breath, or confusion. Does report intermittent dizziness. Does report a mild headache since her last fall which improves after taking Tylenol. Patient drinks 1-1.5 bottle of wine per night.   Medications: Outpatient Medications Prior to Visit  Medication Sig   acetaminophen (TYLENOL) 500 MG tablet Take 500 mg by mouth every 6 (six) hours as needed.   benazepril-hydrochlorthiazide (LOTENSIN HCT) 20-25 MG tablet TAKE 1 TABLET BY MOUTH DAILY.   carvedilol (COREG) 6.25 MG tablet Take 1 tablet (6.25 mg total) by mouth 2 (two) times daily with a meal. Please keep upcoming appt for future refills   Cholecalciferol (VITAMIN D3) 125 MCG (5000 UT) TABS Take 3 tablets (15,000 Units total) by mouth See admin instructions. Takes 15000 units 5 days a week and 20000 units on 2 days. (Patient taking  differently: Take 15,000 Units by mouth See admin instructions. Takes 15,000 units 5 days a week and 20,000 units on 2 days.)   Cyanocobalamin (VITAMIN B-12) 2500 MCG SUBL Place 2,500 mcg under the tongue once a week.   cyclobenzaprine (FLEXERIL) 10 MG tablet TAKE 1 TABLET BY MOUTH DAILY AS NEEDED FOR MUSCLE SPASMS.   diphenhydrAMINE (BENADRYL) 25 MG tablet Take 25 mg by mouth daily as needed (Allergies).   ferrous sulfate 325 (65 FE) MG tablet Take 1 tablet (325 mg total) by mouth daily with breakfast.   furosemide (LASIX) 20 MG tablet Take 20 mg by mouth as needed. Take and additional if needed for fluid   gabapentin (NEURONTIN) 300 MG capsule TAKE 4 CAPSULES BY MOUTH EVERY MORNING, 3 CAPSULES IN THE AFTERNOON, AND 4 CAPSULES AT NIGHT   levothyroxine (SYNTHROID) 125 MCG tablet TAKE 1 TABLET (125 MCG TOTAL) BY MOUTH ONCE DAILY EVERY MORNING BEFORE BREAKFAST   meloxicam (MOBIC) 15 MG tablet Take 1 tablet (15 mg total) by mouth daily.   Mupirocin 2 % KIT Apply 1 application topically daily as needed (Wound).   pantoprazole (PROTONIX) 40 MG tablet TAKE 1 TABLET (40 MG TOTAL) BY MOUTH DAILY.   rosuvastatin (CRESTOR) 5 MG tablet TAKE 1 TABLET BY MOUTH DAILY.   traMADol (ULTRAM) 50 MG tablet TAKE 1 TABLET BY MOUTH EVERY 12 HOURS  AS NEEDED FOR PAIN. MAX 6 TABLETS PER DAY AS DIRECTED BY PRESCRIBER   traZODone (DESYREL) 50 MG tablet TAKE 1/2 TO 1 TABLET BY MOUTH AT BEDTIME AS NEEDED FOR SLEEP.   No facility-administered medications prior to visit.    Review of Systems Review of Systems:  A fourteen system review of systems was performed and found to be positive as per HPI.  Last CBC Lab Results  Component Value Date   WBC 5.6 04/12/2021   HGB 13.0 04/12/2021   HCT 38.1 04/12/2021   MCV 100 (H) 04/12/2021   MCH 34.2 (H) 04/12/2021   RDW 12.7 04/12/2021   PLT 197 40/81/4481   Last metabolic panel Lab Results  Component Value Date   GLUCOSE 105 (H) 08/18/2021   NA 138 08/18/2021   K 4.8  08/18/2021   CL 97 08/18/2021   CO2 26 08/18/2021   BUN 20 08/18/2021   CREATININE 0.85 08/18/2021   EGFR 72 08/18/2021   CALCIUM 9.2 08/18/2021   PHOS 3.8 05/25/2020   PROT 6.5 08/18/2021   ALBUMIN 4.5 08/18/2021   LABGLOB 2.0 08/18/2021   AGRATIO 2.3 (H) 08/18/2021   BILITOT 0.6 08/18/2021   ALKPHOS 89 08/18/2021   AST 30 08/18/2021   ALT 41 (H) 08/18/2021   ANIONGAP 9 05/31/2020   Last lipids Lab Results  Component Value Date   CHOL 146 11/16/2020   HDL 62 11/16/2020   LDLCALC 52 11/16/2020   LDLDIRECT 74 08/18/2021   TRIG 198 (H) 11/16/2020   CHOLHDL 2.4 11/16/2020   Last hemoglobin A1c Lab Results  Component Value Date   HGBA1C 5.3 05/01/2019   Last thyroid functions Lab Results  Component Value Date   TSH 0.782 08/18/2021   T3TOTAL 59 (L) 08/18/2021   Last vitamin D Lab Results  Component Value Date   VD25OH 50.9 04/12/2021   Last vitamin B12 and Folate Lab Results  Component Value Date   VITAMINB12 674 04/12/2021   FOLATE 8.7 04/12/2021     Objective    BP (!) 145/74   Pulse 65   Ht $R'5\' 5"'Nj$  (1.651 m)   Wt 227 lb (103 kg)   SpO2 98%   BMI 37.77 kg/m    Physical Exam  General:  Cooperative, in no acute distress, non-diaphoretic   Neuro:  Alert and oriented,  extra-ocular muscles intact, no focal deficits  HEENT:  Normocephalic, small hemotoma, PERRL, normal TM's of both ears, neck supple; right facial bruising present   Skin:  no gross rash, warm, pink. Cardiac:  RRR, S1 S2 Respiratory: CTA B/L  Vascular:  venous insufficiency  Psych:  No HI/SI, judgement and insight good, Euthymic mood. Full Affect.   No results found for any visits on 05/16/22.  Assessment & Plan     Patient deferred EKG and unable to complete orthostatics. Patient has history of peripheral neuropathy and currently on high dose of Gabapentin. Chronic heavy alcohol use likely contributing to recurrent falls. Will place referral to neurology. Will collect labs to evaluate  for nutritional deficiency, metabolic, or endocrine etiology. Highly recommend to consider alcohol cessation and resources available to assist. Discussed with patient also considering cardiac work-up for syncope.    Return if symptoms worsen or fail to improve.        Lorrene Reid, PA-C  [facility] [phone] (phone) [phone] (fax)  [place]

## 2022-05-16 NOTE — Patient Instructions (Signed)
Syncope, Adult  Syncope refers to a condition in which a person temporarily loses consciousness. Syncope may also be called fainting or passing out. It is caused by a sudden decrease in blood flow to the brain. This can happen for a variety of reasons. Most causes of syncope are not dangerous. It can be triggered by things such as needle sticks, seeing blood, pain, or intense emotion. However, syncope can also be a sign of a serious medical problem, such as a heart abnormality. Other causes can include dehydration, migraines, or taking medicines that lower blood pressure. Your health care provider may do tests to find the reason why you are having syncope. If you faint, get medical help right away. Call your local emergency services (911 in the [place] Follow these instructions at home: Pay attention to any changes in your symptoms. Take these actions to stay safe and to help relieve your symptoms: Knowing when you may be about to faint Signs that you may be about to faint include: Feeling dizzy, weak, light-headed, or like the room is spinning. Feeling nauseous. Seeing spots or seeing all white or all black in your field of vision. Having cold, clammy skin or feeling warm and sweaty. Hearing ringing in the ears (tinnitus). If you start to feel like you might faint, sit or lie down right away. If sitting, put your head down between your legs. If lying down, raise (elevate) your feet above the level of your heart. Breathe deeply and steadily. Wait until all the symptoms have passed. Have someone stay with you until you feel stable. Medicines Take over-the-counter and prescription medicines only as told by your health care provider. If you are taking blood pressure or heart medicine, get up slowly and take several minutes to sit and then stand. This can reduce dizziness and decrease the risk of syncope. Lifestyle Do not drive, use machinery, or play sports until your health care provider says it is  okay. Do not drink alcohol. Do not use any products that contain nicotine or tobacco. These products include cigarettes, chewing tobacco, and vaping devices, such as e-cigarettes. If you need help quitting, ask your health care provider. Avoid hot tubs and saunas. General instructions Talk with your health care provider about your symptoms. You may need to have testing to understand the cause of your syncope. Drink enough fluid to keep your urine pale yellow. Avoid prolonged standing. If you must stand for a long time, do movements such as: Moving your legs. Crossing your legs. Flexing and stretching your leg muscles. Squatting. Keep all follow-up visits. This is important. Contact a health care provider if: You have episodes of near fainting. Get help right away if: You faint. You hit your head or are injured after fainting. You have any of these symptoms that may indicate trouble with your heart: Fast or irregular heartbeats (palpitations). Unusual pain in your chest, abdomen, or back. Shortness of breath. You have a seizure. You have a severe headache. You are confused. You have vision problems. You have severe weakness or trouble walking. You are bleeding from your mouth or rectum, or you have black or tarry stool. These symptoms may represent a serious problem that is an emergency. Do not wait to see if your symptoms will go away. Get medical help right away. Call your local emergency services (911 in the [place] Do not drive yourself to the hospital. Summary Syncope refers to a condition in which a person temporarily loses consciousness. Syncope may also be called   fainting or passing out. It is caused by a sudden decrease in blood flow to the brain. Signs that you may be about to faint include dizziness, feeling light-headed, feeling nauseous, sudden vision changes, or cold, clammy skin. Even though most causes of syncope are not dangerous, syncope can be a sign of a serious  medical problem. Get help right away if you faint. If you start to feel like you might faint, sit or lie down right away. If sitting, put your head down between your legs. If lying down, raise (elevate) your feet above the level of your heart. This information is not intended to replace advice given to you by your health care provider. Make sure you discuss any questions you have with your health care provider. Document Revised: 03/24/2021 Document Reviewed: 03/24/2021 Elsevier Patient Education  2023 [employer]  

## 2022-05-17 LAB — CBC WITH DIFFERENTIAL/PLATELET
Basophils Absolute: 0.1 10*3/uL (ref 0.0–0.2)
Basos: 2 %
EOS (ABSOLUTE): 0.2 10*3/uL (ref 0.0–0.4)
Eos: 4 %
Hematocrit: 35.6 % (ref 34.0–46.6)
Hemoglobin: 12.4 g/dL (ref 11.1–15.9)
Immature Grans (Abs): 0 10*3/uL (ref 0.0–0.1)
Immature Granulocytes: 0 %
Lymphocytes Absolute: 1.9 10*3/uL (ref 0.7–3.1)
Lymphs: 29 %
MCH: 36.3 pg — ABNORMAL HIGH (ref 26.6–33.0)
MCHC: 34.8 g/dL (ref 31.5–35.7)
MCV: 104 fL — ABNORMAL HIGH (ref 79–97)
Monocytes Absolute: 0.7 10*3/uL (ref 0.1–0.9)
Monocytes: 10 %
Neutrophils Absolute: 3.6 10*3/uL (ref 1.4–7.0)
Neutrophils: 55 %
Platelets: 241 10*3/uL (ref 150–450)
RBC: 3.42 x10E6/uL — ABNORMAL LOW (ref 3.77–5.28)
RDW: 12.8 % (ref 11.7–15.4)
WBC: 6.6 10*3/uL (ref 3.4–10.8)

## 2022-05-17 LAB — COMPREHENSIVE METABOLIC PANEL
ALT: 28 IU/L (ref 0–32)
AST: 25 IU/L (ref 0–40)
Albumin/Globulin Ratio: 1.7 (ref 1.2–2.2)
Albumin: 4 g/dL (ref 3.7–4.7)
Alkaline Phosphatase: 103 IU/L (ref 44–121)
BUN/Creatinine Ratio: 19 (ref 12–28)
BUN: 20 mg/dL (ref 8–27)
Bilirubin Total: 0.5 mg/dL (ref 0.0–1.2)
CO2: 25 mmol/L (ref 20–29)
Calcium: 9.3 mg/dL (ref 8.7–10.3)
Chloride: 91 mmol/L — ABNORMAL LOW (ref 96–106)
Creatinine, Ser: 1.08 mg/dL — ABNORMAL HIGH (ref 0.57–1.00)
Globulin, Total: 2.4 g/dL (ref 1.5–4.5)
Glucose: 89 mg/dL (ref 70–99)
Potassium: 4.9 mmol/L (ref 3.5–5.2)
Sodium: 131 mmol/L — ABNORMAL LOW (ref 134–144)
Total Protein: 6.4 g/dL (ref 6.0–8.5)
eGFR: 54 mL/min/{1.73_m2} — ABNORMAL LOW (ref >59)

## 2022-05-17 LAB — B12 AND FOLATE PANEL
Folate: 5.3 ng/mL (ref >3.0)
Vitamin B-12: 326 pg/mL (ref 232–1245)

## 2022-05-17 LAB — VITAMIN D 25 HYDROXY (VIT D DEFICIENCY, FRACTURES): Vit D, 25-Hydroxy: 44.6 ng/mL (ref 30.0–100.0)

## 2022-05-17 LAB — HEMOGLOBIN A1C
Est. average glucose Bld gHb Est-mCnc: 108 mg/dL
Hgb A1c MFr Bld: 5.4 % (ref 4.8–5.6)

## 2022-05-17 LAB — TSH: TSH: 0.725 u[IU]/mL (ref 0.450–4.500)

## 2022-05-18 NOTE — Telephone Encounter (Signed)
Spoke with patient, she stated that her knee isn't hurting that bad. She doesn't feel like she is going to be any more mobile with the treatment do to her spinal problems and has decided not to pursue viscosupplementation at this time.

## 2022-05-31 ENCOUNTER — Other Ambulatory Visit: Payer: Self-pay | Admitting: Cardiology

## 2022-06-02 ENCOUNTER — Ambulatory Visit: Payer: PPO | Admitting: Neurology

## 2022-06-02 ENCOUNTER — Encounter: Payer: Self-pay | Admitting: Neurology

## 2022-06-02 VITALS — BP 164/82 | HR 65 | Ht 65.5 in | Wt 233.5 lb

## 2022-06-02 DIAGNOSIS — G6289 Other specified polyneuropathies: Secondary | ICD-10-CM

## 2022-06-02 DIAGNOSIS — R269 Unspecified abnormalities of gait and mobility: Secondary | ICD-10-CM | POA: Diagnosis not present

## 2022-06-02 DIAGNOSIS — R55 Syncope and collapse: Secondary | ICD-10-CM

## 2022-06-02 DIAGNOSIS — G250 Essential tremor: Secondary | ICD-10-CM

## 2022-06-02 NOTE — Patient Instructions (Addendum)
Remain well hydrated  Decrease alcohol consumption  Please update me if the essential tremors interfere with your daily activities, we can start medication at that time  Continue with Gabapentin for the peripheral neuropathy, consider seeing a podiatrist at least once a year  Continue using the canes, consider physical therapy for gait training  Follow up in a year

## 2022-06-02 NOTE — Progress Notes (Signed)
[facility]  PATIENT: [name] DOB: 1946-05-12  REQUESTING CLINICIAN: Lorrene Reid, PA-C HISTORY FROM: Patient and husband  REASON FOR VISIT: Syncope    HISTORICAL  CHIEF COMPLAINT:  Chief Complaint  Patient presents with   New Patient (Initial Visit)    Rm 12, Husband.  Syncope episodes, worsening neuropathy bilateral feet.  Using walking  canes. Has had falls /syncope 2 episodes in the last 2.5 months. Woke up on hitting the floor.  Unwitnessed.    Not related to position.      HISTORY OF PRESENT ILLNESS:  This is a 76 year old woman past medical history of hypertension, hyperlipidemia, coronary artery disease, peripheral neuropathy and essential tremor who is presenting after 2 syncopal episodes.  Patient reports history of falls, states that most of the time she knows that she fell due to losing her balance.  With these two most recent falls, she was not able to explain them.  She does report the first episode, she was in the kitchen cooking and next thing that she knows is her husband is helping her on the floor.  Per husband patient told him that she feel like she cannot stand anymore, he went to grab a chair for her but unfortunately patient fell to the ground before she got the chair.  He states that her knees gave out and she fell to the floor.  The second episode was a couple weeks ago, she states she was on her feet for quite some time and next thing that she is hitting the ground and unable to get up.  She feels with these episodes there might be dehydration.  She does have a history of neuropathy and complaint of numbness all the way to the knees.  There is also complaint of dizziness upon standing.  She does use an assistive device with ambulation.  Patient also reported a history of heavy alcohol drinking but stated for those 2 episodes she was not drinking that day.  There is also a diagnosis of essential tremor but patient is currently not  on medication.  With her falls and dizziness she is not willing to start propanolol because the tremors are not as bad.     OTHER MEDICAL CONDITIONS: Hypertension, hyperlipidemia, CAD, Peripheral neuropathy, essential tremors    REVIEW OF SYSTEMS: Full 14 system review of systems performed and negative with exception of: as noted in the HPI   ALLERGIES: Allergies  Allergen Reactions   Cocoa Anaphylaxis   Red Dye Hives    Rash and Nausea diarrhea   Lyrica [Pregabalin] Other (See Comments)    Abnormal Nose bleeding   Other Other (See Comments)    Berries : Rash   Sulfa Antibiotics Rash    HOME MEDICATIONS: Outpatient Medications Prior to Visit  Medication Sig Dispense Refill   acetaminophen (TYLENOL) 500 MG tablet Take 500 mg by mouth every 6 (six) hours as needed.     benazepril-hydrochlorthiazide (LOTENSIN HCT) 20-25 MG tablet TAKE 1 TABLET BY MOUTH DAILY. 90 tablet 1   carvedilol (COREG) 6.25 MG tablet TAKE 1 TABLET BY MOUTH 2 TIMES DAILY WITH A MEAL. PLEASE KEEP UPCOMING APPT FOR FUTURE REFILLS 180 tablet 0   Cholecalciferol (VITAMIN D3) 125 MCG (5000 UT) TABS Take 3 tablets (15,000 Units total) by mouth See admin instructions. Takes 15000 units 5 days a week and 20000 units on 2 days. (Patient taking differently: Take 15,000 Units by mouth See admin instructions. Takes 15,000 units 5 days a week  and 20,000 units on 2 days.) 30 tablet 0   Cyanocobalamin (VITAMIN B-12) 2500 MCG SUBL Place 2,500 mcg under the tongue once a week.     cyclobenzaprine (FLEXERIL) 10 MG tablet TAKE 1 TABLET BY MOUTH DAILY AS NEEDED FOR MUSCLE SPASMS. 90 tablet 0   diphenhydrAMINE (BENADRYL) 25 MG tablet Take 25 mg by mouth daily as needed (Allergies).     ferrous sulfate 325 (65 FE) MG tablet Take 1 tablet (325 mg total) by mouth daily with breakfast. 30 tablet 3   furosemide (LASIX) 20 MG tablet Take 20 mg by mouth as needed. Take and additional if needed for fluid 90 tablet 3   gabapentin (NEURONTIN)  300 MG capsule TAKE 4 CAPSULES BY MOUTH EVERY MORNING, 3 CAPSULES IN THE AFTERNOON, AND 4 CAPSULES AT NIGHT 990 capsule 0   levothyroxine (SYNTHROID) 125 MCG tablet TAKE 1 TABLET (125 MCG TOTAL) BY MOUTH ONCE DAILY EVERY MORNING BEFORE BREAKFAST 90 tablet 1   meloxicam (MOBIC) 15 MG tablet Take 1 tablet (15 mg total) by mouth daily. 90 tablet 3   Mupirocin 2 % KIT Apply 1 application topically daily as needed (Wound).     pantoprazole (PROTONIX) 40 MG tablet TAKE 1 TABLET (40 MG TOTAL) BY MOUTH DAILY. 90 tablet 1   rosuvastatin (CRESTOR) 5 MG tablet TAKE 1 TABLET BY MOUTH DAILY. 90 tablet 0   traMADol (ULTRAM) 50 MG tablet TAKE 1 TABLET BY MOUTH EVERY 12 HOURS AS NEEDED FOR PAIN. MAX 6 TABLETS PER DAY AS DIRECTED BY PRESCRIBER 60 tablet 3   traZODone (DESYREL) 50 MG tablet TAKE 1/2 TO 1 TABLET BY MOUTH AT BEDTIME AS NEEDED FOR SLEEP. 30 tablet 1   No facility-administered medications prior to visit.    PAST MEDICAL HISTORY: Past Medical History:  Diagnosis Date   Alcohol abuse 04/25/2015   reportedly drinks ~ 1 bottle of wine / day   Allergy    Anastomotic ulcer S/P gastric bypass 06/18/2016   Aortic atherosclerosis (HCC)    Arthritis    Cataract    Chronic diastolic heart failure ([place]) 10/25/2017   Degenerative disc disease, cervical    DVT, bilateral lower limbs ([place]) 05/2020   Age-indeterminate bilateral PERONEAL VEIN DVTs --> (was postop from acute cord compression, started on Xarelto Dosepak   Essential hypertension 04/25/2015   GAD (generalized anxiety disorder) 06/14/2016   HNP (herniated nucleus pulposus) with myelopathy, cervical 05/27/2020   Cervical Myelopathy with Spinal cord compression (HCC)  --> s/p Anterior Cervical Discectomy Decompression)    Hypothyroidism    IDA (iron deficiency anemia)    Kidney damage    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   UGIB (upper gastrointestinal bleed) 04/25/2015   Venous stasis of both lower extremities  01/10/2019    PAST SURGICAL HISTORY: Past Surgical History:  Procedure Laterality Date   ABDOMINAL HYSTERECTOMY     ANTERIOR CERVICAL DECOMP/DISCECTOMY FUSION Left 05/26/2020   Procedure: Cervical seven - Thoracic one ANTERIOR CERVICAL DISCECTOMY AND PLATING;  Surgeon: Newman Pies, MD;  Location: [facility];  Service: Neurosurgery;  Laterality: Left;   CHOLECYSTECTOMY     COSMETIC SURGERY     extra fat taken off arms and eyelid lifted   ESOPHAGOGASTRODUODENOSCOPY N/A 04/25/2015   Procedure: ESOPHAGOGASTRODUODENOSCOPY (EGD);  Surgeon: Jerene Bears, MD;  Location[facility] ENDOSCOPY;  Service: Endoscopy;  Laterality: N/A;   FRACTURE SURGERY     GASTRIC BYPASS  2003   gastric ulcer repair  04/25/2015   NM  MYOVIEW LTD  10/10/2017   Normal LV size and function EF 72%.  Medium sized mild severity defect in the apical septal apical lateral and apical wall suggestive of breast attenuation.  LOW RISK.  No ischemia or infarction.     OPEN REDUCTION INTERNAL FIXATION (ORIF) DISTAL RADIAL FRACTURE Right 01/15/2013   Procedure: OPEN REDUCTION INTERNAL FIXATION (ORIF) Right DISTAL RADIUS FRACTURE;  Surgeon: Marybelle Killings, MD;  Location: [facility];  Service: Orthopedics;  Laterality: Right;   SPINE SURGERY     TRANSTHORACIC ECHOCARDIOGRAM  10/04/2017   Mild LVH.  EF 60-65%.  Grade 2/moderate diastolic dysfunction.  Mild pulmonary hypertension.  No valve lesions   VENTRAL HERNIA REPAIR N/A 12/02/2020   Procedure: HERNIA REPAIR INCISIONAL  ADULT, open;  Surgeon: Jules Husbands, MD;  Location: [facility]S;  Service: General;  Laterality: N/A;    FAMILY HISTORY: Family History  Problem Relation Age of Onset   Heart disease Mother    Stroke Mother 60   Cancer Mother        skin and breast   Hypertension Mother    Breast cancer Mother 32   COPD Father    Cancer Father    Diabetes Father    Hypertension Father    Stroke Maternal Grandmother    Mental illness Maternal Grandmother    Breast cancer Sister    Cancer  Cousin 62    SOCIAL HISTORY: Social History   Socioeconomic History   Marital status: Married    Spouse name: Not on file   Number of children: 4   Years of education: Not on file   Highest education level: Not on file  Occupational History   Occupation: Therapist, sports    Comment: ICU Tree surgeon  Tobacco Use   Smoking status: Never   Smokeless tobacco: Never  Vaping Use   Vaping Use: Never used  Substance and Sexual Activity   Alcohol use: Yes    Alcohol/week: 28.0 standard drinks of alcohol    Types: 28 Glasses of wine per week    Comment: drinks daily and has a bottle and half of wine per day   Drug use: No   Sexual activity: Yes    Birth control/protection: Post-menopausal  Other Topics Concern   Not on file  Social History Narrative   Marital status: married      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      Alcohol: drinks bottle of wine daily in 2016   Takes care of her 70 y/o mom at home   Social Determinants of Health   Financial Resource Strain: Low Risk  (11/29/2021)   Overall Financial Resource Strain (CARDIA)    Difficulty of Paying Living Expenses: Not hard at all  Food Insecurity: No Food Insecurity (11/29/2021)   Hunger Vital Sign    Worried About Running Out of Food in the Last Year: Never true    [place] in the Last Year: Never true  Transportation Needs: No Transportation Needs (11/29/2021)   PRAPARE - Hydrologist (Medical): No    Lack of Transportation (Non-Medical): No  Physical Activity: Insufficiently Active (11/29/2021)   Exercise Vital Sign    Days of Exercise per Week: 1 day    Minutes of Exercise per Session: 30 min  Stress: No Stress Concern Present (11/29/2021)   [place]    Feeling of Stress : Not at all  Social Connections: Moderately Isolated (11/29/2021)   Social Connection and Isolation Panel [NHANES]    Frequency of Communication with Friends  and Family: More than three times a week    Frequency of Social Gatherings with Friends and Family: Once a week    Attends Religious Services: Never    Marine scientist or Organizations: No    Attends Archivist Meetings: Never    Marital Status: Married  Human resources officer Violence: Not At Risk (11/29/2021)   Humiliation, Afraid, Rape, and Kick questionnaire    Fear of Current or Ex-Partner: No    Emotionally Abused: No    Physically Abused: No    Sexually Abused: No    PHYSICAL EXAM  GENERAL EXAM/CONSTITUTIONAL: Vitals:  Vitals:   06/02/22 1015  BP: (!) 164/82  Pulse: 65  Weight: 233 lb 8 oz (105.9 kg)  Height: 5' 5.5" (1.664 m)   Body mass index is 38.27 kg/m. Wt Readings from Last 3 Encounters:  06/02/22 233 lb 8 oz (105.9 kg)  05/16/22 227 lb (103 kg)  03/15/22 229 lb 3.2 oz (104 kg)   Patient is in no distress; well developed, nourished and groomed; neck is supple  EYES: Pupils round and reactive to light, Visual fields full to confrontation, Extraocular movements intacts,   MUSCULOSKELETAL: Gait, strength, tone, movements noted in Neurologic exam below  NEUROLOGIC: MENTAL STATUS:      No data to display         awake, alert, oriented to person, place and time recent and remote memory intact normal attention and concentration language fluent, comprehension intact, naming intact fund of knowledge appropriate  CRANIAL NERVE:  2nd, 3rd, 4th, 6th - pupils equal and reactive to light, visual fields full to confrontation, extraocular muscles intact, no nystagmus 5th - facial sensation symmetric 7th - facial strength symmetric 8th - hearing intact 9th - palate elevates symmetrically, uvula midline 11th - shoulder shrug symmetric 12th - tongue protrusion midline  MOTOR:  normal bulk and tone, full strength in the BUE, BLE  SENSORY:  normal and symmetric to light touch, pinprick,  vibration in the BUE but decrease light touch, vibration and  pinprick up to mid shin bilaterally.   COORDINATION:  finger-nose-finger, fine finger movements normal  REFLEXES:  deep tendon reflexes present and symmetric  GAIT/STATION:  Ambulates with 2 canes    DIAGNOSTIC DATA (LABS, IMAGING, TESTING) - I reviewed patient records, labs, notes, testing and imaging myself where available.  Lab Results  Component Value Date   WBC 6.6 05/16/2022   HGB 12.4 05/16/2022   HCT 35.6 05/16/2022   MCV 104 (H) 05/16/2022   PLT 241 05/16/2022      Component Value Date/Time   NA 131 (L) 05/16/2022 1113   K 4.9 05/16/2022 1113   CL 91 (L) 05/16/2022 1113   CO2 25 05/16/2022 1113   GLUCOSE 89 05/16/2022 1113   GLUCOSE 155 (H) 05/31/2020 0752   BUN 20 05/16/2022 1113   CREATININE 1.08 (H) 05/16/2022 1113   CREATININE 0.79 09/29/2016 1019   CALCIUM 9.3 05/16/2022 1113   PROT 6.4 05/16/2022 1113   ALBUMIN 4.0 05/16/2022 1113   AST 25 05/16/2022 1113   ALT 28 05/16/2022 1113   ALKPHOS 103 05/16/2022 1113   BILITOT 0.5 05/16/2022 1113   GFRNONAA 42 (L) 11/16/2020 1130   GFRNONAA 77 09/29/2016 1019   GFRAA 49 (L) 11/16/2020 1130   GFRAA 88 09/29/2016 1019   Lab Results  Component Value Date  CHOL 146 11/16/2020   HDL 62 11/16/2020   LDLCALC 52 11/16/2020   LDLDIRECT 74 08/18/2021   TRIG 198 (H) 11/16/2020   CHOLHDL 2.4 11/16/2020   Lab Results  Component Value Date   HGBA1C 5.4 05/16/2022   Lab Results  Component Value Date   VITAMINB12 326 05/16/2022   Lab Results  Component Value Date   TSH 0.725 05/16/2022    MRI Brain 05/25/2020 1. Negative brain MRI for age, with no acute intracranial abnormality identified.    ASSESSMENT AND PLAN  76 y.o. year old female with multiple medical conditions including hypertension, hyperlipidemia, diabetes mellitus, gait abnormality who is presenting after 2 syncopal episode.  I informed patient based on history that syncope likely related to dehydration.  Another consideration also  include medication side effect as she is taking Flexeril, Neurontin, and tramadol for chronic pain.  She is aware that she does not drink a lot of fluid and does have episode of dehydration.  There is also large amount of alcohol intake.  I informed patient to decrease her alcohol intake, increase her hydration with water and electrolytes and to continue using her walker or cane at home.   In terms of her neuropathy, she has decreased sensation all the way to mid shin.  Husband reported a few months ago he removed a piece of broken glass in her foot, patient was not aware, did not feel anything, husband saw blood on the floor. I did advise her to wear closed toed shoes and also to set up care with a podiatrist.  She voices understanding.  I will see her in 6 months for follow-up.   1. Other polyneuropathy   2. Essential tremor   3. Syncope, unspecified syncope type      Patient Instructions  Remain well hydrated  Decrease alcohol consumption  Please update me if the essential tremors interfere with your daily activities, we can start medication at that time  Continue with Gabapentin for the peripheral neuropathy, consider seeing a podiatrist at least once a year  Continue using the canes, consider physical therapy for gait training  Follow up in a year    No orders of the defined types were placed in this encounter.   No orders of the defined types were placed in this encounter.   Return in about 6 months (around 12/03/2022).  I have spent a total of 65 minutes dedicated to this patient today, preparing to see patient, performing a medically appropriate examination and evaluation, ordering tests and/or medications and procedures, and counseling and educating the patient/family/caregiver; independently interpreting result and communicating results to the family/patient/caregiver; and documenting clinical information in the electronic medical record.   Alric Ran, MD 06/02/2022, 12:41  PM  [facility] [address] [place], [place] [address] [phone]

## 2022-06-05 ENCOUNTER — Other Ambulatory Visit: Payer: Self-pay | Admitting: Physician Assistant

## 2022-06-05 DIAGNOSIS — F10982 Alcohol use, unspecified with alcohol-induced sleep disorder: Secondary | ICD-10-CM

## 2022-07-05 ENCOUNTER — Telehealth: Payer: Self-pay | Admitting: Physician Assistant

## 2022-07-05 DIAGNOSIS — R296 Repeated falls: Secondary | ICD-10-CM

## 2022-07-05 DIAGNOSIS — M170 Bilateral primary osteoarthritis of knee: Secondary | ICD-10-CM

## 2022-07-05 DIAGNOSIS — G629 Polyneuropathy, unspecified: Secondary | ICD-10-CM

## 2022-07-05 DIAGNOSIS — G9520 Unspecified cord compression: Secondary | ICD-10-CM

## 2022-07-05 DIAGNOSIS — M5000 Cervical disc disorder with myelopathy, unspecified cervical region: Secondary | ICD-10-CM

## 2022-07-05 NOTE — Telephone Encounter (Signed)
Patient and husband are having a stair lift installed in their home due to severe arthritis and not being able to get up and down stairs. Patient is requesting a prescription for this so it can help pay for the equipment.

## 2022-07-05 NOTE — Telephone Encounter (Signed)
Patient aware and will come pick up 

## 2022-07-11 ENCOUNTER — Other Ambulatory Visit: Payer: Self-pay | Admitting: Physician Assistant

## 2022-07-11 DIAGNOSIS — E039 Hypothyroidism, unspecified: Secondary | ICD-10-CM

## 2022-07-26 ENCOUNTER — Other Ambulatory Visit: Payer: Self-pay | Admitting: Physician Assistant

## 2022-07-26 DIAGNOSIS — G8929 Other chronic pain: Secondary | ICD-10-CM

## 2022-08-02 ENCOUNTER — Other Ambulatory Visit: Payer: Self-pay | Admitting: Physician Assistant

## 2022-08-15 ENCOUNTER — Other Ambulatory Visit: Payer: Self-pay | Admitting: Sports Medicine

## 2022-08-15 ENCOUNTER — Other Ambulatory Visit: Payer: Self-pay | Admitting: Physician Assistant

## 2022-08-15 DIAGNOSIS — M170 Bilateral primary osteoarthritis of knee: Secondary | ICD-10-CM

## 2022-08-15 DIAGNOSIS — F10982 Alcohol use, unspecified with alcohol-induced sleep disorder: Secondary | ICD-10-CM

## 2022-08-31 ENCOUNTER — Other Ambulatory Visit: Payer: Self-pay | Admitting: Physician Assistant

## 2022-08-31 DIAGNOSIS — I10 Essential (primary) hypertension: Secondary | ICD-10-CM

## 2022-09-06 ENCOUNTER — Other Ambulatory Visit: Payer: Self-pay | Admitting: Cardiology

## 2022-09-11 ENCOUNTER — Other Ambulatory Visit: Payer: Self-pay | Admitting: Physician Assistant

## 2022-09-11 DIAGNOSIS — E782 Mixed hyperlipidemia: Secondary | ICD-10-CM

## 2022-09-12 ENCOUNTER — Other Ambulatory Visit: Payer: Self-pay | Admitting: Physician Assistant

## 2022-09-12 DIAGNOSIS — K219 Gastro-esophageal reflux disease without esophagitis: Secondary | ICD-10-CM

## 2022-09-18 ENCOUNTER — Other Ambulatory Visit: Payer: Self-pay | Admitting: Sports Medicine

## 2022-09-25 ENCOUNTER — Encounter: Payer: Self-pay | Admitting: Cardiology

## 2022-09-25 ENCOUNTER — Ambulatory Visit: Payer: PPO | Attending: Cardiology | Admitting: Cardiology

## 2022-09-25 VITALS — BP 134/72 | HR 74 | Ht 65.0 in | Wt 233.8 lb

## 2022-09-25 DIAGNOSIS — I878 Other specified disorders of veins: Secondary | ICD-10-CM

## 2022-09-25 DIAGNOSIS — I5032 Chronic diastolic (congestive) heart failure: Secondary | ICD-10-CM

## 2022-09-25 DIAGNOSIS — I10 Essential (primary) hypertension: Secondary | ICD-10-CM

## 2022-09-25 NOTE — Patient Instructions (Signed)

## 2022-09-25 NOTE — Progress Notes (Signed)
Primary Care Provider: Lorrene Reid, [place] Cardiologist: Glenetta Hew, MD Electrophysiologist: None  Clinic Note: Chief Complaint  Patient presents with   Follow-up    Delayed 36-monthfollow-up.  Stable   ===================================  ASSESSMENT/PLAN   Problem List Items Addressed This Visit       Cardiology Problems   Chronic heart failure with preserved ejection fraction (HFpEF) ([place] ([place]ronic)    Most of her symptoms are probably more related to venous stasis, lymphedema etc.  She ORoxy Mannscombination of beta-blocker and benazepril and HCTZ.  She also uses as needed Lasix but has not used that much lately.  Unfortunately, because of her dizziness she also has not adequately hydrated and therefore start avoid excess Lasix.  Consider evaluation for sleep apnea.      Essential hypertension (Chronic)    Stable blood pressure on current medications. Continue current dose of carvedilol and Benicar and HCTZ.  She is drinking less alcohol, but also may contribute to hypertension.      Venous stasis of both lower extremities (Chronic)    Left greater than right leg swelling with the right leg now has an ulcer.  Continue to recommend support stockings of elevation.  Call started by doing exercises.  The more she exercises, the last off her balance was..      ===================================  HPI:    LMakisha [name] a 76y.o. female with a PMH below who presents today for 668-monthollow-up at the request of [name], [name], PA-C.  PMH: Chronic HFpEF w/ chronic LE edema complicated by venous insufficiency HTN, HLD Obesity-history of GOP surgery History of EtOH abuse History of GI bleed  LyRogers SeedscBenhamas last seen on March 15, 2022-as a follow-up visits.  Accompanied by her husband.  Still wearing TED hose with left greater than right swelling.  Not able to lie flat due to back pain so unable to assess PND  orthopnea.  Denied chest pressure or pain with rest or exertion.  Not very much exertion.  No rapid irregular heartbeats or palpitations.  Easily fatigued.  Exercise intolerance.  Relatively immobile.  Recent Hospitalizations: None  Reviewed  CV studies:    The following studies were reviewed today: (if available, images/films reviewed: From Epic Chart or Care Everywhere) None:  Interval History:   LyZowie Lundahleturns for delayed 6-21-monthllow-up doing relatively well.  She just got orders to get labs drawn by her PCP in the next couple weeks.  Most notable thing since I last saw her was right ankle ulcer that continues to have issues.  Swelling is essentially stable.  She denies any real palpitations or irregular heartbeats.  She says over the last 6 months her balance has been much better she has not had any more blackout spells or falls.  Overall doing much better with better hydration.  She does have a lot of peripheral neuropathy type symptoms which makes it harder for stand.   She has not a baseline fatigue not much energy as she is somewhat sedentary..  With the activity level that she does that she is not having any significant symptoms.  No chest pain or pressure with rest or with minimal exertion that she does.  Does not walk enough for claudication. No PND or orthopnea.    Because she is somewhat sedentary and does not really get up much, she does note that she will get dizzy if she stands up too quickly. Mobility limited by  severe back pain.  Unable to really do much activity and unable to lie flat.  REVIEWED OF SYSTEMS   Review of Systems  Constitutional:  Positive for malaise/fatigue (Sedentary.  Not a lot of energy.  Does not do much.). Negative for weight loss.  HENT:  Negative for congestion.   Respiratory:  Positive for shortness of breath (Baseline) and wheezing (With allergies).   Cardiovascular:  Positive for leg swelling (Stable.  Has right ankle ulcer  now.).  Gastrointestinal:  Negative for abdominal pain, blood in stool, melena and vomiting.  Genitourinary:  Negative for frequency.  Musculoskeletal:  Positive for back pain (Limits mobility.), joint pain and neck pain.       Unable to lie flat because of back pain  Neurological:  Positive for dizziness (With standing up too quickly.) and weakness (Legs).  Psychiatric/Behavioral:  Negative for depression and memory loss. The patient is nervous/anxious. The patient does not have insomnia.    I have reviewed and (if needed) personally updated the patient's problem list, medications, allergies, past medical and surgical history, social and family history.   PAST MEDICAL HISTORY   Past Medical History:  Diagnosis Date   Alcohol abuse 04/25/2015   reportedly drinks ~ 1 bottle of wine / day   Allergy    Anastomotic ulcer S/P gastric bypass 06/18/2016   Aortic atherosclerosis (HCC)    Arthritis    Cataract    Chronic diastolic heart failure ([place]) 10/25/2017   Degenerative disc disease, cervical    DVT, bilateral lower limbs ([place]) 05/2020   Age-indeterminate bilateral PERONEAL VEIN DVTs --> (was postop from acute cord compression, started on Xarelto Dosepak   Essential hypertension 04/25/2015   GAD (generalized anxiety disorder) 06/14/2016   HNP (herniated nucleus pulposus) with myelopathy, cervical 05/27/2020   Cervical Myelopathy with Spinal cord compression (HCC)  --> s/p Anterior Cervical Discectomy Decompression)    Hypothyroidism    IDA (iron deficiency anemia)    Kidney damage    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   UGIB (upper gastrointestinal bleed) 04/25/2015   Venous stasis of both lower extremities 01/10/2019    PAST SURGICAL HISTORY   Past Surgical History:  Procedure Laterality Date   ABDOMINAL HYSTERECTOMY     ANTERIOR CERVICAL DECOMP/DISCECTOMY FUSION Left 05/26/2020   Procedure: Cervical seven - Thoracic one ANTERIOR CERVICAL DISCECTOMY  AND PLATING;  Surgeon: Newman Pies, MD;  Location: [facility];  Service: Neurosurgery;  Laterality: Left;   CHOLECYSTECTOMY     COSMETIC SURGERY     extra fat taken off arms and eyelid lifted   ESOPHAGOGASTRODUODENOSCOPY N/A 04/25/2015   Procedure: ESOPHAGOGASTRODUODENOSCOPY (EGD);  Surgeon: Jerene Bears, MD;  Location[facility] ENDOSCOPY;  Service: Endoscopy;  Laterality: N/A;   FRACTURE SURGERY     GASTRIC BYPASS  2003   gastric ulcer repair  04/25/2015   NM MYOVIEW LTD  10/10/2017   Normal LV size and function EF 72%.  Medium sized mild severity defect in the apical septal apical lateral and apical wall suggestive of breast attenuation.  LOW RISK.  No ischemia or infarction.     OPEN REDUCTION INTERNAL FIXATION (ORIF) DISTAL RADIAL FRACTURE Right 01/15/2013   Procedure: OPEN REDUCTION INTERNAL FIXATION (ORIF) Right DISTAL RADIUS FRACTURE;  Surgeon: Marybelle Killings, MD;  Location: [facility];  Service: Orthopedics;  Laterality: Right;   SPINE SURGERY     TRANSTHORACIC ECHOCARDIOGRAM  10/04/2017   Mild LVH.  EF 60-65%.  Grade 2/moderate diastolic dysfunction.  Mild pulmonary hypertension.  No valve lesions   VENTRAL HERNIA REPAIR N/A 12/02/2020   Procedure: HERNIA REPAIR INCISIONAL  ADULT, open;  Surgeon: Jules Husbands, MD;  Location: [facility];  Service: General;  Laterality: N/A;    Immunization History  Administered Date(s) Administered   Fluad Quad(high Dose 65+) 09/03/2019, 11/16/2020, 10/09/2022   Influenza, High Dose Seasonal PF 09/25/2017, 08/09/2021   Influenza,inj,Quad PF,6+ Mos 01/10/2017   Moderna Covid-19 Vaccine Bivalent Booster 8yr & up 08/09/2021   Moderna Sars-Covid-2 Vaccination 08/09/2021   PFIZER(Purple Top)SARS-COV-2 Vaccination 01/10/2020, 02/04/2020, 09/01/2020, 02/26/2021   Td 11/27/2013   Tdap 12/05/2018   Unspecified SARS-COV-2 Vaccination 09/01/2022   Zoster Recombinat (Shingrix) 12/29/2020, 08/18/2021    MEDICATIONS/ALLERGIES   Current Meds  Medication Sig    acetaminophen (TYLENOL) 500 MG tablet Take 500 mg by mouth every 6 (six) hours as needed.   benazepril-hydrochlorthiazide (LOTENSIN HCT) 20-25 MG tablet TAKE 1 TABLET BY MOUTH DAILY.   carvedilol (COREG) 6.25 MG tablet Take 1 tablet (6.25 mg total) by mouth 2 (two) times daily with a meal.   Cholecalciferol (VITAMIN D3) 125 MCG (5000 UT) TABS Take 3 tablets (15,000 Units total) by mouth See admin instructions. Takes 15000 units 5 days a week and 20000 units on 2 days. (Patient taking differently: Take 15,000 Units by mouth See admin instructions. Takes 15,000 units 5 days a week and 20,000 units on 2 days.)   Cyanocobalamin (VITAMIN B-12) 2500 MCG SUBL Place 2,500 mcg under the tongue once a week.   cyclobenzaprine (FLEXERIL) 10 MG tablet TAKE 1 TABLET BY MOUTH DAILY AS NEEDED FOR MUSCLE SPASMS.   diphenhydrAMINE (BENADRYL) 25 MG tablet Take 25 mg by mouth daily as needed (Allergies).   ferrous sulfate 325 (65 FE) MG tablet Take 1 tablet (325 mg total) by mouth daily with breakfast.   furosemide (LASIX) 20 MG tablet Take 20 mg by mouth as needed. Take and additional if needed for fluid   gabapentin (NEURONTIN) 300 MG capsule TAKE 4 CAPSULES BY MOUTH EVERY MORNING, 3 CAPSULES IN THE AFTERNOON, AND 4 CAPSULES AT NIGHT   levothyroxine (SYNTHROID) 125 MCG tablet TAKE 1 TABLET (125 MCG TOTAL) BY MOUTH ONCE DAILY EVERY MORNING BEFORE BREAKFAST   meloxicam (MOBIC) 15 MG tablet TAKE 1 TABLET (15 MG TOTAL) BY MOUTH DAILY.   Mupirocin 2 % KIT Apply 1 application topically daily as needed (Wound).   pantoprazole (PROTONIX) 40 MG tablet TAKE 1 TABLET (40 MG TOTAL) BY MOUTH DAILY.   rosuvastatin (CRESTOR) 5 MG tablet TAKE 1 TABLET BY MOUTH DAILY.   traMADol (ULTRAM) 50 MG tablet TAKE 1 TABLET BY MOUTH EVERY 12 HOURS AS NEEDED FOR PAIN. MAX 6 TABLETS PER DAY AS DIRECTED BY PRESCRIBER   traZODone (DESYREL) 50 MG tablet TAKE 1/2 TO 1 TABLET BY MOUTH AT BEDTIME AS NEEDED FOR SLEEP.    Allergies  Allergen  Reactions   Cocoa Anaphylaxis   Red Dye Hives    Rash and Nausea diarrhea   Lyrica [Pregabalin] Other (See Comments)    Abnormal Nose bleeding   Other Other (See Comments)    Berries : Rash   Sulfa Antibiotics Rash    SOCIAL HISTORY/FAMILY HISTORY   Reviewed in Epic:  Pertinent findings:  Social History   Tobacco Use   Smoking status: Never   Smokeless tobacco: Never  Vaping Use   Vaping Use: Never used  Substance Use Topics   Alcohol use: Yes    Alcohol/week: 28.0 standard drinks of alcohol  Types: 28 Glasses of wine per week    Comment: drinks daily and has a bottle and half of wine per day   Drug use: No   Social History   Social History Narrative   Marital status: married      Employment: ICU Tree surgeon - retired      Alcohol: drinks bottle of wine daily in 2016   Takes care of her 92 y/o mom at home    OBJCTIVE -PE, EKG, labs   Wt Readings from Last 3 Encounters:  10/09/22 233 lb (105.7 kg)  09/25/22 233 lb 12.8 oz (106.1 kg)  06/02/22 233 lb 8 oz (105.9 kg)    Physical Exam: BP 134/72 (BP Location: Left Arm, Patient Position: Sitting, Cuff Size: Large)   Pulse 74   Ht _0  (1.651 m)   Wt 233 lb 12.8 oz (106.1 kg)   SpO2 98%   BMI 38.91 kg/m  Physical Exam Vitals reviewed.  Constitutional:      General: She is not in acute distress.    Appearance: Normal appearance. She is obese. She is ill-appearing (Chronic ill-appearing.  Morbidly.  Wheelchair-bound-essentially immobile.).  HENT:     Head: Normocephalic and atraumatic.  Neck:     Vascular: No carotid bruit or JVD.  Cardiovascular:     Rate and Rhythm: Normal rate and regular rhythm. No extrasystoles are present.    Chest Wall: PMI is not displaced.     Pulses: Decreased pulses (Difficult to palpate due to body habitus.).     Heart sounds: S1 normal and S2 normal. Heart sounds are distant. Murmur (2/6 SEM at RUSB) heard.     No friction rub. No gallop.  Pulmonary:     Effort:  Pulmonary effort is normal. No respiratory distress.     Breath sounds: No wheezing, rhonchi or rales.     Comments: Diffusely reduced breath sounds with no obvious rales or rhonchi. Chest:     Chest wall: No tenderness.  Abdominal:     Comments: Truncal obesity  Musculoskeletal:        General: Normal range of motion.     Cervical back: Normal range of motion and neck supple.     Right lower leg: Edema (1+) present.     Left lower leg: Edema (2+) present.  Skin:    General: Skin is warm and dry.     Comments: Wears support stockings with toes cut out.  Does have dressing on the right leg ulcers. Purple/violaceous stasis changes with varicosities.  Neurological:     General: No focal deficit present.     Mental Status: She is alert and oriented to person, place, and time.     Gait: Gait abnormal (Wheelchair-bound).  Psychiatric:        Mood and Affect: Mood normal.        Behavior: Behavior normal.        Thought Content: Thought content normal.        Judgment: Judgment normal.     Adult ECG Report Not checked  Recent Labs: recent labs available from PCP.-10/12/2022 (added as addendum) Component 10/12/22)  1 yr ago  Cholesterol, Total 169  146  Triglycerides 104  198 High   HDL 88  62  VLDL Cholesterol Cal 18  32   Component (10/12/22)   Hgb A1c MFr Bld 5.6  TSH 1.00  WBC 7.1  Hgb 12.6  HCT 38.3  PLT 254   Component (10/12/22)  Glucose 88  BUN 12  Creatinine, Ser 0.86  eGFR 70  BUN/Creatinine Ratio 14  Sodium 133 Low   Potassium 4.6  Chloride 92 Low   CO2 27  Calcium 9.4  Total Protein 6.1  Albumin 3.9  Globulin, Total 2.2  Bilirubin Total 0.3  Alkaline Phosphatase 134 High   AST 51 High   ALT 40 High    ================================================== I spent a total of 20 minutes with the patient spent in direct patient consultation.  Additional time spent with chart review  / charting (studies, outside notes, etc): 176mn Total Time: 36  min  Current medicines are reviewed at length with the patient today.  (+/- concerns) none  Notice: This dictation was prepared with Dragon dictation along with smart phrase technology. Any transcriptional errors that result from this process are unintentional and may not be corrected upon review.  Studies Ordered:   No orders of the defined types were placed in this encounter.  No orders of the defined types were placed in this encounter.   Patient Instructions / Medication Changes & Studies & Tests Ordered   Patient Instructions  Medication Instructions:  No changes  *If you need a refill on your cardiac medications before your next appointment, please call your pharmacy*   Lab Work:  Not needed   Testing/Procedures: Not needed   Follow-Up: [facility] and your [facility] needs are our [facility].  As part of our continuing mission to provide you with exceptional heart care, we have created designated Provider Care Teams.  These Care Teams include your primary Cardiologist (physician) and Advanced Practice Providers (APPs -  Physician Assistants and Nurse Practitioners) who all work together to provide you with the care you need, when you need it.     Your next appointment:   12 month(s)  The format for your next appointment:   In Person  Provider:   DGlenetta Hew MD      DLeonie Man MD, MS DGlenetta Hew M.D., M.S. Interventional Cardiologist  [place] Pager # [phone] # [phone] [place] [address]  Thank you for choosing [facility]!

## 2022-10-05 ENCOUNTER — Encounter (HOSPITAL_BASED_OUTPATIENT_CLINIC_OR_DEPARTMENT_OTHER): Payer: PPO | Attending: General Surgery | Admitting: General Surgery

## 2022-10-05 DIAGNOSIS — Z9884 Bariatric surgery status: Secondary | ICD-10-CM | POA: Insufficient documentation

## 2022-10-05 DIAGNOSIS — I5032 Chronic diastolic (congestive) heart failure: Secondary | ICD-10-CM | POA: Diagnosis not present

## 2022-10-05 DIAGNOSIS — Z6838 Body mass index (BMI) 38.0-38.9, adult: Secondary | ICD-10-CM | POA: Insufficient documentation

## 2022-10-05 DIAGNOSIS — L97312 Non-pressure chronic ulcer of right ankle with fat layer exposed: Secondary | ICD-10-CM | POA: Diagnosis not present

## 2022-10-05 DIAGNOSIS — Z86718 Personal history of other venous thrombosis and embolism: Secondary | ICD-10-CM | POA: Insufficient documentation

## 2022-10-05 DIAGNOSIS — E668 Other obesity: Secondary | ICD-10-CM | POA: Insufficient documentation

## 2022-10-05 DIAGNOSIS — I110 Hypertensive heart disease with heart failure: Secondary | ICD-10-CM | POA: Diagnosis not present

## 2022-10-05 DIAGNOSIS — I872 Venous insufficiency (chronic) (peripheral): Secondary | ICD-10-CM | POA: Insufficient documentation

## 2022-10-09 ENCOUNTER — Other Ambulatory Visit: Payer: Self-pay

## 2022-10-09 ENCOUNTER — Encounter: Payer: Self-pay | Admitting: Physician Assistant

## 2022-10-09 ENCOUNTER — Ambulatory Visit (INDEPENDENT_AMBULATORY_CARE_PROVIDER_SITE_OTHER): Payer: PPO | Admitting: Physician Assistant

## 2022-10-09 VITALS — BP 131/75 | HR 74 | Resp 18 | Ht 65.0 in | Wt 233.0 lb

## 2022-10-09 DIAGNOSIS — Z0000 Encounter for general adult medical examination without abnormal findings: Secondary | ICD-10-CM

## 2022-10-09 DIAGNOSIS — E782 Mixed hyperlipidemia: Secondary | ICD-10-CM

## 2022-10-09 DIAGNOSIS — Z23 Encounter for immunization: Secondary | ICD-10-CM | POA: Diagnosis not present

## 2022-10-09 DIAGNOSIS — E039 Hypothyroidism, unspecified: Secondary | ICD-10-CM

## 2022-10-09 DIAGNOSIS — I10 Essential (primary) hypertension: Secondary | ICD-10-CM

## 2022-10-09 DIAGNOSIS — Z1231 Encounter for screening mammogram for malignant neoplasm of breast: Secondary | ICD-10-CM

## 2022-10-09 DIAGNOSIS — Z789 Other specified health status: Secondary | ICD-10-CM

## 2022-10-09 NOTE — Patient Instructions (Signed)
Preventive Care 65 Years and Older, Female Preventive care refers to lifestyle choices and visits with your health care provider that can promote health and wellness. Preventive care visits are also called wellness exams. What can I expect for my preventive care visit? Counseling Your health care provider may ask you questions about your: Medical history, including: Past medical problems. Family medical history. Pregnancy and menstrual history. History of falls. Current health, including: Memory and ability to understand (cognition). Emotional well-being. Home life and relationship well-being. Sexual activity and sexual health. Lifestyle, including: Alcohol, nicotine or tobacco, and drug use. Access to firearms. Diet, exercise, and sleep habits. Work and work environment. Sunscreen use. Safety issues such as seatbelt and bike helmet use. Physical exam Your health care provider will check your: Height and weight. These may be used to calculate your BMI (body mass index). BMI is a measurement that tells if you are at a healthy weight. Waist circumference. This measures the distance around your waistline. This measurement also tells if you are at a healthy weight and may help predict your risk of certain diseases, such as type 2 diabetes and high blood pressure. Heart rate and blood pressure. Body temperature. Skin for abnormal spots. What immunizations do I need?  Vaccines are usually given at various ages, according to a schedule. Your health care provider will recommend vaccines for you based on your age, medical history, and lifestyle or other factors, such as travel or where you work. What tests do I need? Screening Your health care provider may recommend screening tests for certain conditions. This may include: Lipid and cholesterol levels. Hepatitis C test. Hepatitis B test. HIV (human immunodeficiency virus) test. STI (sexually transmitted infection) testing, if you are at  risk. Lung cancer screening. Colorectal cancer screening. Diabetes screening. This is done by checking your blood sugar (glucose) after you have not eaten for a while (fasting). Mammogram. Talk with your health care provider about how often you should have regular mammograms. BRCA-related cancer screening. This may be done if you have a family history of breast, ovarian, tubal, or peritoneal cancers. Bone density scan. This is done to screen for osteoporosis. Talk with your health care provider about your test results, treatment options, and if necessary, the need for more tests. Follow these instructions at home: Eating and drinking  Eat a diet that includes fresh fruits and vegetables, whole grains, lean protein, and low-fat dairy products. Limit your intake of foods with high amounts of sugar, saturated fats, and salt. Take vitamin and mineral supplements as recommended by your health care provider. Do not drink alcohol if your health care provider tells you not to drink. If you drink alcohol: Limit how much you have to 0-1 drink a day. Know how much alcohol is in your drink. In the [place] one drink equals one 12 oz bottle of beer (355 mL), one 5 oz glass of wine (148 mL), or one 1 oz glass of hard liquor (44 mL). Lifestyle Brush your teeth every morning and night with fluoride toothpaste. Floss one time each day. Exercise for at least 30 minutes 5 or more days each week. Do not use any products that contain nicotine or tobacco. These products include cigarettes, chewing tobacco, and vaping devices, such as e-cigarettes. If you need help quitting, ask your health care provider. Do not use drugs. If you are sexually active, practice safe sex. Use a condom or other form of protection in order to prevent STIs. Take aspirin only as told by   your health care provider. Make sure that you understand how much to take and what form to take. Work with your health care provider to find out whether it  is safe and beneficial for you to take aspirin daily. Ask your health care provider if you need to take a cholesterol-lowering medicine (statin). Find healthy ways to manage stress, such as: Meditation, yoga, or listening to music. Journaling. Talking to a trusted person. Spending time with friends and family. Minimize exposure to UV radiation to reduce your risk of skin cancer. Safety Always wear your seat belt while driving or riding in a vehicle. Do not drive: If you have been drinking alcohol. Do not ride with someone who has been drinking. When you are tired or distracted. While texting. If you have been using any mind-altering substances or drugs. Wear a helmet and other protective equipment during sports activities. If you have firearms in your house, make sure you follow all gun safety procedures. What's next? Visit your health care provider once a year for an annual wellness visit. Ask your health care provider how often you should have your eyes and teeth checked. Stay up to date on all vaccines. This information is not intended to replace advice given to you by your health care provider. Make sure you discuss any questions you have with your health care provider. Document Revised: 05/11/2021 Document Reviewed: 05/11/2021 Elsevier Patient Education  2023 [employer]  

## 2022-10-09 NOTE — Progress Notes (Signed)
TOMAKA, SCHEUERMAN ([id number]) [id number] Page 1 of 10 Visit Report for 10/05/2022 Chief Complaint Document Details Patient Name: Date of Service: [name], [name] 10/05/2022 1:30 PM Medical Record Number: [id number] Patient Account Number: [id number] Date of Birth/Sex: Treating RN: Jul 02, 1946 (76 y.o. F) Primary Care Provider: Lorrene Reid Other Clinician: Referring Provider: Treating Provider/Extender: Casandra Doffing in Treatment: 0 Information Obtained from: Patient Chief Complaint Bilateral LE Ulcers 10/05/2022: RLE ulcer Electronic Signature(s) Signed: 10/05/2022 2:27:11 PM By: Fredirick Maudlin MD FACS Entered By: Fredirick Maudlin on 10/05/2022 14:27:11 -------------------------------------------------------------------------------- Debridement Details Patient Name: Date of Service: [name], [name]. 10/05/2022 1:30 PM Medical Record Number: [id number] Patient Account Number: [id number] Date of Birth/Sex: Treating RN: 04-07-1946 (76 y.o. [name], [name] Primary Care Provider: Lorrene Reid Other Clinician: Referring Provider: Treating Provider/Extender: Lourena Simmonds Weeks in Treatment: 0 Debridement Performed for Assessment: Wound #6 Right,Lateral Ankle Performed By: Physician Fredirick Maudlin, MD Debridement Type: Debridement Level of Consciousness (Pre-procedure): Awake and Alert Pre-procedure Verification/Time Out Yes - 14:16 Taken: Start Time: 14:17 Pain Control: Lidocaine 5% topical ointment T Area Debrided (L x W): otal 1.2 (cm) x 1.1 (cm) = 1.32 (cm) Tissue and other material debrided: Non-Viable, Eschar, Slough, Slough Level: Non-Viable Tissue Debridement Description: Selective/Open Wound Instrument: Curette Bleeding: Minimum Hemostasis Achieved: Pressure Response to Treatment: Procedure was tolerated well Level of Consciousness (Post- Awake and Alert procedure): Post  Debridement Measurements of Total Wound Length: (cm) 1.2 Width: (cm) 1.1 Depth: (cm) 0.1 Volume: (cm) 0.104 Character of Wound/Ulcer Post Debridement: Requires Further Debridement Post Procedure Diagnosis Same as [name], [name] ([id number]) [id number] Page 2 of 10 Notes Scribed for Dr. Celine Ahr by Blanche East, RN+ Electronic Signature(s) Signed: 10/05/2022 3:09:58 PM By: Fredirick Maudlin MD FACS Signed: 10/09/2022 5:04:49 PM By: Blanche East RN Entered By: Blanche East on 10/05/2022 14:20:59 -------------------------------------------------------------------------------- HPI Details Patient Name: Date of Service: [name], [name]. 10/05/2022 1:30 PM Medical Record Number: [id number] Patient Account Number: [id number] Date of Birth/Sex: Treating RN: 1946-05-14 (76 y.o. F) Primary Care Provider: Lorrene Reid Other Clinician: Referring Provider: Treating Provider/Extender: Casandra Doffing in Treatment: 0 History of Present Illness HPI Description: 07/09/18 on evaluation today patient presents for bilateral lower extremity ulcers. She has a past medical history significant for chronic venous stasis, hypertension, and lower extremity edema with associated skin changes. Unfortunately she tells me that roughly 3 weeks ago she noted ulcers on the left lower extremity which started given her trouble. Subsequently she ended up proceeding with applying mupirocin ointment and was keeping this open air for the most part. She states that it would scab over somewhat and then reopened. The wounds have been training quite significantly. Fortunately there does not appear to be any evidence of overt infection there is some erythematous type skin changes but I believe this is likely stasis dermatitis nothing more. Fortunately she has no evidence of any systemic infection. No fevers, chills, nausea, or vomiting noted at this time.  She has been recommended before to wear compression stockings but she does not wear them on a regular basis. She has never been suggested to get Juxta-Lite compression wraps she was not aware of what these were. Patient had venous studies performed on 11/09/17 which showed no lower surety DVT or deep vein insufficiency noted. She did not have evaluation however of the superficial veins. There was full compressibility noted throughout. Her arterial study which was performed on 11/18/17 actually showed that she has an ABI  on the right of 1.17 with a TBI of 1.59 along with an ABI of 1.12 on the left and a TBI of 0.99. 07/17/18 on evaluation today patient appears to be doing much better in regard to her left lower Trinity ulcers. She's been tolerating the dressing changes without complication. There does not appear to be any evidence of infection which is good news. Overall I'm very pleased with the progress at this time. 07/24/18 on evaluation today patient's wound actually appears to be completely healed at this time. Fortunately there does not appear to be any evidence of infection which is good news. Overall I'm very happy with the progress that has been made. She did get her Velcro compression wraps. READMISSION 12/31/2020 This is a patient we had in this clinic in 2019 felt to have chronic venous insufficiency wounds on her lower extremities. She was discharged with bilateral compression stockings. She tells [place] that she never really wore them or at best wore them sparingly. She is back in clinic today having a ulcer on her left lateral lower extremity since October and one on the right anterior for the last 2 weeks. I do not know that she is doing any primary dressing on these certainly not wearing any form of compression. She is already been evaluated by vein and vascular on 11/15/2020. They noted that she did have ulcers on the left leg in October that healed with a need Unna boot. She had a reflux  study on 12/20 that did not show evidence of a DVT or venous reflux bilaterally. They was not felt that she had arterial insufficiency based on the fact that she had brisk biphasic Doppler signals in the bilateral DP PT. The patient comes in today with superficial wounds on the right anterior mid tibia and the left lateral calf. These are small appear to have healthy surfaces. Past medical history includes cervical and upper thoracic spine surgery on 7/21, hernia repair early in January, hypertension, hyperlipidemia, hypothyroidism, gastric bypass surgery, DVT in the right peroneal artery and left popliteal and peroneal veins. Next problem CHF. She does not have diabetes ABIs in our clinic were 1.1 on the right and 1.2 on the lower 2/11; the patient's wound on the left lateral and right anterior lower leg are closed for edema control is good. She has an assortment of old juxta lite stockings with old liners. She does not like to wear anything in particular under the compression part of the juxta lite stocking. I think she is going to do exactly what she wants. READMISSION 10/05/2022 This is a 76 year old woman last seen in our clinic about 18 months ago. She has a history of venous stasis and stasis dermatitis. A reflux study done in 2021 did not show any evidence of venous reflux. She says about 3 weeks ago, she noticed an ulcer on her right lateral malleolus. She was applying triple antibiotic ointment to it for a while and then was soaking it in Epsom salts. She reports that she usually wears compression stockings, but that they were rubbing so she did not wear 1 on her right leg since the wound appeared, she has a stocking with the toe and heel cut out applied to her left leg. On examination, she has changes of chronic stasis dermatitis on the right. There is a shallow ulcer overlying the lateral malleolus. The fat layer is exposed. It is a little bit desiccated but fairly clean with just some  light eschar and slough present. No  malodor or purulent drainage. Electronic Signature(s) Signed: 10/05/2022 2:30:52 PM By: Duanne Guess MD FACS Entered By: Duanne Guess on 10/05/2022 14:30:51 [name] ([id number]) [id number] Page 3 of 10 -------------------------------------------------------------------------------- Physical Exam Details Patient Name: Date of Service: [name], [name] 10/05/2022 1:30 PM Medical Record Number: [id number] Patient Account Number: [id number] Date of Birth/Sex: Treating RN: 06-11-1946 (76 y.o. F) Primary Care Provider: Mayer Masker Other Clinician: Referring Provider: Treating Provider/Extender: Fernanda Drum Weeks in Treatment: 0 Constitutional . . . . No acute distress. Respiratory Normal work of breathing on room air.. Cardiovascular Changes consistent with chronic venous stasis. 2+ pitting edema.. Notes 10/05/2022: She has changes of chronic stasis dermatitis on the right; she is wearing a stocking on the left. There is a shallow ulcer overlying the right lateral malleolus. The fat layer is exposed. It is a little bit desiccated but fairly clean with just some light eschar and slough present. No malodor or purulent drainage. Electronic Signature(s) Signed: 10/05/2022 2:32:32 PM By: Duanne Guess MD FACS Entered By: Duanne Guess on 10/05/2022 14:32:32 -------------------------------------------------------------------------------- Physician Orders Details Patient Name: Date of Service: [name], [name]. 10/05/2022 1:30 PM Medical Record Number: [id number] Patient Account Number: [id number] Date of Birth/Sex: Treating RN: October 14, 1946 (76 y.o. [name], [name] Primary Care Provider: Mayer Masker Other Clinician: Referring Provider: Treating Provider/Extender: Marjean Donna in Treatment: 0 Verbal / Phone Orders: No Diagnosis Coding ICD-10  Coding Code Description L97.312 Non-pressure chronic ulcer of right ankle with fat layer exposed I87.2 Venous insufficiency (chronic) (peripheral) I50.32 Chronic diastolic (congestive) heart failure E66.09 Other obesity due to excess calories Follow-up Appointments ppointment in 1 week. - Dr. Lady Gary Rm 4 Return A Anesthetic (In clinic) Topical Lidocaine 5% applied to wound bed - in clinic, prior to debridement Bathing/ Shower/ Hygiene May shower with protection but do not get wound dressing(s) wet. - Keep dressing dry at all times until next appointment. Can purchase cast protector from [facility] or [facility] Edema Control - Lymphedema / SCD / Other Bilateral Lower Extremities Elevate legs to the level of the heart or above for 30 minutes daily and/or when sitting, a frequency ofBAELYNN, JEPPESEN ([id number]) [id number] Page 4 of 10 Avoid standing for long periods of time. Patient to wear own compression stockings every day. Moisturize legs daily. Wound Treatment Wound #6 - Ankle Wound Laterality: Right, Lateral Cleanser: Soap and Water Discharge Instructions: May shower and wash wound with dial antibacterial soap and water prior to dressing change. Peri-Wound Care: Sween Lotion (Moisturizing lotion) Discharge Instructions: Apply moisturizing lotion as directed Prim Dressing: Promogran Prisma Matrix, 4.34 (sq in) (silver collagen) ary Discharge Instructions: Moisten collagen with saline or hydrogel Secondary Dressing: ABD Pad, 8x10 Discharge Instructions: Apply over primary dressing as directed. Secured With: Transpore Surgical Tape, 2x10 (in/yd) Discharge Instructions: Secure dressing with tape as directed. Compression Wrap: ThreePress (3 layer compression wrap) Discharge Instructions: Apply three layer compression as directed. Electronic Signature(s) Signed: 10/05/2022 3:09:58 PM By: Duanne Guess MD FACS Entered By: Duanne Guess on 10/05/2022  14:33:13 -------------------------------------------------------------------------------- Problem List Details Patient Name: Date of Service: [name], [name]. 10/05/2022 1:30 PM Medical Record Number: [id number] Patient Account Number: [id number] Date of Birth/Sex: Treating RN: Sep 12, 1946 (76 y.o. F) Primary Care Provider: Mayer Masker Other Clinician: Referring Provider: Treating Provider/Extender: Fernanda Drum Weeks in Treatment: 0 Active Problems ICD-10 Encounter Code Description Active Date MDM Diagnosis L97.312 Non-pressure chronic ulcer of right ankle with fat layer exposed 10/05/2022 No Yes I87.2  Venous insufficiency (chronic) (peripheral) 10/05/2022 No Yes [id number] Chronic diastolic (congestive) heart failure 10/05/2022 No Yes E66.09 Other obesity due to excess calories 10/05/2022 No Yes Inactive Problems Resolved Problems Electronic Signature(s) Signed: 10/05/2022 2:25:38 PM By: Fredirick Maudlin MD FACS [place], Naveah [name] ([id number]) By: Fredirick Maudlin MD [place] [phone].pdf Page 5 of 10 Signed: 10/05/2022 2:25:38 PM Previous Signature: 10/05/2022 2:03:20 PM Version By: Fredirick Maudlin MD FACS Entered By: Fredirick Maudlin on 10/05/2022 14:25:38 -------------------------------------------------------------------------------- Progress Note Details Patient Name: Date of Service: [name], Starlee [name]. 10/05/2022 1:30 PM Medical Record Number: [id number] Patient Account Number: [id number] Date of Birth/Sex: Treating RN: 23-Dec-1945 (76 y.o. F) Primary Care Provider: Lorrene Reid Other Clinician: Referring Provider: Treating Provider/Extender: Casandra Doffing in Treatment: 0 Subjective Chief Complaint Information obtained from Patient Bilateral LE Ulcers 10/05/2022: RLE ulcer History of Present Illness (HPI) 07/09/18 on evaluation today patient presents for bilateral lower extremity ulcers. She has a  past medical history significant for chronic venous stasis, hypertension, and lower extremity edema with associated skin changes. Unfortunately she tells me that roughly 3 weeks ago she noted ulcers on the left lower extremity which started given her trouble. Subsequently she ended up proceeding with applying mupirocin ointment and was keeping this open air for the most part. She states that it would scab over somewhat and then reopened. The wounds have been training quite significantly. Fortunately there does not appear to be any evidence of overt infection there is some erythematous type skin changes but I believe this is likely stasis dermatitis nothing more. Fortunately she has no evidence of any systemic infection. No fevers, chills, nausea, or vomiting noted at this time. She has been recommended before to wear compression stockings but she does not wear them on a regular basis. She has never been suggested to get Juxta-Lite compression wraps she was not aware of what these were. Patient had venous studies performed on 11/09/17 which showed no lower surety DVT or deep vein insufficiency noted. She did not have evaluation however of the superficial veins. There was full compressibility noted throughout. Her arterial study which was performed on 11/18/17 actually showed that she has an ABI on the right of 1.17 with a TBI of 1.59 along with an ABI of 1.12 on the left and a TBI of 0.99. 07/17/18 on evaluation today patient appears to be doing much better in regard to her left lower Trinity ulcers. She's been tolerating the dressing changes without complication. There does not appear to be any evidence of infection which is good news. Overall I'm very pleased with the progress at this time. 07/24/18 on evaluation today patient's wound actually appears to be completely healed at this time. Fortunately there does not appear to be any evidence of infection which is good news. Overall I'm very happy with  the progress that has been made. She did get her Velcro compression wraps. READMISSION 12/31/2020 This is a patient we had in this clinic in 2019 felt to have chronic venous insufficiency wounds on her lower extremities. She was discharged with bilateral compression stockings. She tells [place] that she never really wore them or at best wore them sparingly. She is back in clinic today having a ulcer on her left lateral lower extremity since October and one on the right anterior for the last 2 weeks. I do not know that she is doing any primary dressing on these certainly not wearing any form of compression. She is already been evaluated by vein and vascular on  11/15/2020. They noted that she did have ulcers on the left leg in October that healed with a need Unna boot. She had a reflux study on 12/20 that did not show evidence of a DVT or venous reflux bilaterally. They was not felt that she had arterial insufficiency based on the fact that she had brisk biphasic Doppler signals in the bilateral DP PT. The patient comes in today with superficial wounds on the right anterior mid tibia and the left lateral calf. These are small appear to have healthy surfaces. Past medical history includes cervical and upper thoracic spine surgery on 7/21, hernia repair early in January, hypertension, hyperlipidemia, hypothyroidism, gastric bypass surgery, DVT in the right peroneal artery and left popliteal and peroneal veins. Next problem CHF. She does not have diabetes ABIs in our clinic were 1.1 on the right and 1.2 on the lower 2/11; the patient's wound on the left lateral and right anterior lower leg are closed for edema control is good. She has an assortment of old juxta lite stockings with old liners. She does not like to wear anything in particular under the compression part of the juxta lite stocking. I think she is going to do exactly what she wants. READMISSION 10/05/2022 This is a 76 year old woman last seen in our  clinic about 18 months ago. She has a history of venous stasis and stasis dermatitis. A reflux study done in 2021 did not show any evidence of venous reflux. She says about 3 weeks ago, she noticed an ulcer on her right lateral malleolus. She was applying triple antibiotic ointment to it for a while and then was soaking it in Epsom salts. She reports that she usually wears compression stockings, but that they were rubbing so she did not wear 1 on her right leg since the wound appeared, she has a stocking with the toe and heel cut out applied to her left leg. On examination, she has changes of chronic stasis dermatitis on the right. There is a shallow ulcer overlying the lateral malleolus. The fat layer is exposed. It is a little bit desiccated but fairly clean with just some light eschar and slough present. No malodor or purulent drainage. Patient History Information obtained from Patient. Allergies cocoa (Severity: Severe, Reaction: anaphylaxis), red dye (Severity: Severe, Reaction: hives), Lyrica (Severity: Severe, Reaction: nose bleeds, severe bleeding), Sulfa (Sulfonamide Antibiotics) (Severity: Moderate, Reaction: rash) [name], [name] ([id number]) [id number] Page 6 of 10 Family History Cancer - Mother,Father, Diabetes - Father, Heart Disease - Father, Hypertension - Father, Stroke - Mother, No family history of Hereditary Spherocytosis, Kidney Disease, Lung Disease, Seizures, Thyroid Problems, Tuberculosis. Social History Never smoker, Marital Status - Married, Alcohol Use - Daily - 1.5 bottles of wine daily, Drug Use - No History, Caffeine Use - Daily - coffee. Medical History Eyes Patient has history of Cataracts - mild Denies history of Glaucoma, Optic Neuritis Ear/Nose/Mouth/Throat Denies history of Chronic sinus problems/congestion, Middle ear problems Hematologic/Lymphatic Patient has history of Anemia - iron deficiency, Lymphedema Denies history of  Hemophilia, Human Immunodeficiency Virus, Sickle Cell Disease Respiratory Denies history of Aspiration, Asthma, Chronic Obstructive Pulmonary Disease (COPD), Pneumothorax, Sleep Apnea, Tuberculosis Cardiovascular Patient has history of Congestive Heart Failure, Hypertension, Peripheral Venous Disease Denies history of Angina, Arrhythmia, Coronary Artery Disease, Deep Vein Thrombosis, Hypotension, Myocardial Infarction, Peripheral Arterial Disease, Phlebitis, Vasculitis Gastrointestinal Denies history of Cirrhosis , Colitis, Crohnoos, Hepatitis A, Hepatitis B, Hepatitis C Endocrine Denies history of Type I Diabetes, Type II Diabetes Genitourinary Denies history of  End Stage Renal Disease Immunological Denies history of Lupus Erythematosus, Raynaudoos, Scleroderma Integumentary (Skin) Denies history of History of Burn Musculoskeletal Patient has history of Osteoarthritis Denies history of Gout, Rheumatoid Arthritis, Osteomyelitis Neurologic Patient has history of Neuropathy - bila Denies history of Dementia, Quadriplegia, Paraplegia, Seizure Disorder Oncologic Denies history of Received Chemotherapy, Received Radiation Psychiatric Patient has history of Confinement Anxiety Denies history of Anorexia/bulimia Hospitalization/Surgery History - right arm surgery. - gastric bypass. - hysterectomy. - tonsillectomy. - hernia repair. - cervical myelopathy with spinal cord compression. Medical A Surgical History Notes nd Constitutional Symptoms (General Health) obesity Ear/Nose/Mouth/Throat seasonal allergies Cardiovascular aortic murmur, hyperlipidemia, hypertriglyceridemia Gastrointestinal UGI bleed, Endocrine hypothyroidism Review of Systems (ROS) Constitutional Symptoms (General Health) Denies complaints or symptoms of Fatigue, Fever, Chills, Marked Weight Change. Eyes Denies complaints or symptoms of Dry Eyes. Musculoskeletal Complains or has symptoms of Muscle Weakness -  increased weakness in the last month. Psychiatric Complains or has symptoms of Claustrophobia. Objective Constitutional No acute distress. Vitals Time Taken: 1:15 AM, Height: 65 in, Weight: 229 lbs, BMI: 38.1, Temperature: 98.2 F, Pulse: 71 bpm, Respiratory Rate: 20 breaths/min, Blood Pressure: 131/81 mmHg. Respiratory Normal work of breathing on room air.. Cardiovascular Changes consistent with chronic venous stasis. 2+ pitting edema.[name] Kitchen [name], [name] ([id number]) [id number] Page 7 of 10 General Notes: 10/05/2022: She has changes of chronic stasis dermatitis on the right; she is wearing a stocking on the left. There is a shallow ulcer overlying the right lateral malleolus. The fat layer is exposed. It is a little bit desiccated but fairly clean with just some light eschar and slough present. No malodor or purulent drainage. Integumentary (Hair, Skin) Wound #6 status is Open. Original cause of wound was Gradually Appeared. The date acquired was: 09/25/2022. The wound is located on the Right,Lateral Ankle. The wound measures 1.2cm length x 1.1cm width x 0.1cm depth; 1.037cm^2 area and 0.104cm^3 volume. There is no tunneling or undermining noted. There is a medium amount of serous drainage noted. The wound margin is flat and intact. There is medium (34-66%) pink granulation within the wound bed. There is a medium (34-66%) amount of necrotic tissue within the wound bed including Adherent Slough. The periwound skin appearance exhibited: Dry/Scaly. Periwound temperature was noted as No Abnormality. Assessment Active Problems ICD-10 Non-pressure chronic ulcer of right ankle with fat layer exposed Venous insufficiency (chronic) (peripheral) Chronic diastolic (congestive) heart failure Other obesity due to excess calories Procedures Wound #6 Pre-procedure diagnosis of Wound #6 is a T be determined located on the Right,Lateral Ankle . There was a Selective/Open  Wound Non-Viable Tissue o Debridement with a total area of 1.32 sq cm performed by Fredirick Maudlin, MD. With the following instrument(s): Curette to remove Non-Viable tissue/material. Material removed includes Eschar and Slough and after achieving pain control using Lidocaine 5% topical ointment. A time out was conducted at 14:16, prior to the start of the procedure. A Minimum amount of bleeding was controlled with Pressure. The procedure was tolerated well. Post Debridement Measurements: 1.2cm length x 1.1cm width x 0.1cm depth; 0.104cm^3 volume. Character of Wound/Ulcer Post Debridement requires further debridement. Post procedure Diagnosis Wound #6: Same as Pre-Procedure General Notes: Scribed for Dr. Celine Ahr by Blanche East, RN+. Plan Follow-up Appointments: Return Appointment in 1 week. - Dr. Celine Ahr Rm 4 Anesthetic: (In clinic) Topical Lidocaine 5% applied to wound bed - in clinic, prior to debridement Bathing/ Shower/ Hygiene: May shower with protection but do not get wound dressing(s) wet. - Keep dressing dry at all  times until next appointment. Can purchase cast protector from [facility] or [facility] Edema Control - Lymphedema / SCD / Other: Elevate legs to the level of the heart or above for 30 minutes daily and/or when sitting, a frequency of: Avoid standing for long periods of time. Patient to wear own compression stockings every day. Moisturize legs daily. WOUND #6: - Ankle Wound Laterality: Right, Lateral Cleanser: Soap and Water Discharge Instructions: May shower and wash wound with dial antibacterial soap and water prior to dressing change. Peri-Wound Care: Sween Lotion (Moisturizing lotion) Discharge Instructions: Apply moisturizing lotion as directed Prim Dressing: Promogran Prisma Matrix, 4.34 (sq in) (silver collagen) ary Discharge Instructions: Moisten collagen with saline or hydrogel Secondary Dressing: ABD Pad, 8x10 Discharge Instructions: Apply over primary dressing as  directed. Secured With: Transpore Surgical T ape, 2x10 (in/yd) Discharge Instructions: Secure dressing with tape as directed. Com pression Wrap: ThreePress (3 layer compression wrap) Discharge Instructions: Apply three layer compression as directed. On examination, she has changes of chronic stasis dermatitis on the right. There is a shallow ulcer overlying the lateral malleolus. The fat layer is exposed. It is a little bit desiccated but fairly clean with just some light eschar and slough present. No malodor or purulent drainage. I used a curette to debride slough and eschar from her wound. Given how dry it is, I want to use Prisma silver collagen with hydrogel. We will use 3 layer compression. She was reminded to keep her legs elevated throughout the day and at night when she is sleeping. Follow-up in 1 week. [name], [name] ([id number]) [id number] Page 8 of 10 Electronic Signature(s) Signed: 10/05/2022 2:34:00 PM By: Fredirick Maudlin MD FACS Entered By: Fredirick Maudlin on 10/05/2022 14:34:00 -------------------------------------------------------------------------------- HxROS Details Patient Name: Date of Service: [name], Marsena [name]. 10/05/2022 1:30 PM Medical Record Number: [id number] Patient Account Number: [id number] Date of Birth/Sex: Treating RN: 06-Feb-1946 (76 y.o. [name] Primary Care Provider: Lorrene Reid Other Clinician: Referring Provider: Treating Provider/Extender: Casandra Doffing in Treatment: 0 Information Obtained From Patient Constitutional Symptoms (General Health) Complaints and Symptoms: Negative for: Fatigue; Fever; Chills; Marked Weight Change Medical History: Past Medical History Notes: obesity Eyes Complaints and Symptoms: Negative for: Dry Eyes Medical History: Positive for: Cataracts - mild Negative for: Glaucoma; Optic Neuritis Musculoskeletal Complaints and Symptoms: Positive for:  Muscle Weakness - increased weakness in the last month Medical History: Positive for: Osteoarthritis Negative for: Gout; Rheumatoid Arthritis; Osteomyelitis Psychiatric Complaints and Symptoms: Positive for: Claustrophobia Medical History: Positive for: Confinement Anxiety Negative for: Anorexia/bulimia Ear/Nose/Mouth/Throat Medical History: Negative for: Chronic sinus problems/congestion; Middle ear problems Past Medical History Notes: seasonal allergies Hematologic/Lymphatic Medical History: Positive for: Anemia - iron deficiency; Lymphedema Negative for: Hemophilia; Human Immunodeficiency Virus; Sickle Cell Disease Respiratory Medical History: Negative for: Aspiration; Asthma; Chronic Obstructive Pulmonary Disease (COPD); Pneumothorax; Sleep Apnea; Tuberculosis Cardiovascular MALAYJA, [name] ([id number]) [id number] Page 9 of 10 Medical History: Positive for: Congestive Heart Failure; Hypertension; Peripheral Venous Disease Negative for: Angina; Arrhythmia; Coronary Artery Disease; Deep Vein Thrombosis; Hypotension; Myocardial Infarction; Peripheral Arterial Disease; Phlebitis; Vasculitis Past Medical History Notes: aortic murmur, hyperlipidemia, hypertriglyceridemia Gastrointestinal Medical History: Negative for: Cirrhosis ; Colitis; Crohns; Hepatitis A; Hepatitis B; Hepatitis C Past Medical History Notes: UGI bleed, Endocrine Medical History: Negative for: Type I Diabetes; Type II Diabetes Past Medical History Notes: hypothyroidism Genitourinary Medical History: Negative for: End Stage Renal Disease Immunological Medical History: Negative for: Lupus Erythematosus; Raynauds; Scleroderma Integumentary (Skin) Medical History: Negative for: History of Burn Neurologic  Medical History: Positive for: Neuropathy - bila Negative for: Dementia; Quadriplegia; Paraplegia; Seizure Disorder Oncologic Medical History: Negative for: Received  Chemotherapy; Received Radiation HBO Extended History Items Eyes: Cataracts Immunizations Pneumococcal Vaccine: Received Pneumococcal Vaccination: No Implantable Devices No devices added Hospitalization / Surgery History Type of Hospitalization/Surgery right arm surgery gastric bypass hysterectomy tonsillectomy hernia repair cervical myelopathy with spinal cord compression Family and Social History Cancer: Yes - Mother,Father; Diabetes: Yes - Father; Heart Disease: Yes - Father; Hereditary Spherocytosis: No; Hypertension: Yes - Father; Kidney Disease: No; Lung Disease: No; Seizures: No; Stroke: Yes - Mother; Thyroid Problems: No; Tuberculosis: No; Never smoker; Marital Status - Married; Alcohol Use: Daily - 1.5 bottles of wine daily; Drug Use: No History; Caffeine Use: Daily - coffee; Financial Concerns: No; Food, Clothing or Shelter Needs: No; Support System Lacking: No; Transportation Concerns: No Physician Affirmation I have reviewed and agree with the above information. SHANESSA, LIEBLING ([id number]) [id number] Page 10 of 10 Electronic Signature(s) Signed: 10/05/2022 3:09:58 PM By: Fredirick Maudlin MD FACS Signed: 10/09/2022 5:04:49 PM By: Blanche East RN Entered By: Blanche East on 10/05/2022 13:45:40 -------------------------------------------------------------------------------- SuperBill Details Patient Name: Date of Service: [name], [place]. 10/05/2022 Medical Record Number: [id number] Patient Account Number: [id number] Date of Birth/Sex: Treating RN: 07/12/1946 (76 y.o. F) Primary Care Provider: Lorrene Reid Other Clinician: Referring Provider: Treating Provider/Extender: Casandra Doffing in Treatment: 0 Diagnosis Coding ICD-10 Codes Code Description [phone] Non-pressure chronic ulcer of right ankle with fat layer exposed I87.2 Venous insufficiency (chronic) (peripheral) [id number] Chronic diastolic  (congestive) heart failure E66.09 Other obesity due to excess calories Facility Procedures : CPT4 Code: [id number] Description: [place] VISIT-LEV 3 EST PT Modifier: 25 Quantity: 1 : CPT4 Code: [id number] Description: [id number] - DEBRIDE WOUND 1ST 20 SQ CM OR < ICD-10 Diagnosis Description L97.312 Non-pressure chronic ulcer of right ankle with fat layer exposed Modifier: Quantity: 1 Physician Procedures : CPT4 Code Description Modifier [id number] - WC PHYS LEVEL 4 - EST PT 25 ICD-10 Diagnosis Description L97.312 Non-pressure chronic ulcer of right ankle with fat layer exposed I87.2 Venous insufficiency (chronic) (peripheral) 99991111 Chronic diastolic  (congestive) heart failure E66.09 Other obesity due to excess calories Quantity: 1 : [id number] - WC PHYS DEBR WO ANESTH 20 SQ CM ICD-10 Diagnosis Description L97.312 Non-pressure chronic ulcer of right ankle with fat layer exposed Quantity: 1 Electronic Signature(s) Signed: 10/05/2022 4:37:26 PM By: Fredirick Maudlin MD FACS Signed: 10/09/2022 5:04:49 PM By: Blanche East RN Previous Signature: 10/05/2022 2:35:10 PM Version By: Fredirick Maudlin MD FACS Entered By: Blanche East on 10/05/2022 16:30:47

## 2022-10-09 NOTE — Progress Notes (Signed)
Complete physical exam   Patient: [name]   DOB: 1946/09/03   76 y.o. Female  MRN: [id number] Visit Date: 10/09/2022   Chief Complaint  Patient presents with   Annual Exam    Non fasting   Subjective    [name] is a 76 y.o. female who presents today for a complete physical exam.  She reports consuming a  balanced  diet. Exercise is limited by neurologic condition(s): neuropathy. She generally feels fairly well. She does not have additional problems to discuss today.     Past Medical History:  Diagnosis Date   Alcohol abuse 04/25/2015   reportedly drinks ~ 1 bottle of wine / day   Allergy    Anastomotic ulcer S/P gastric bypass 06/18/2016   Aortic atherosclerosis (HCC)    Arthritis    Cataract    Chronic diastolic heart failure ([place]) 10/25/2017   Degenerative disc disease, cervical    DVT, bilateral lower limbs ([place]) 05/2020   Age-indeterminate bilateral PERONEAL VEIN DVTs --> (was postop from acute cord compression, started on Xarelto Dosepak   Essential hypertension 04/25/2015   GAD (generalized anxiety disorder) 06/14/2016   HNP (herniated nucleus pulposus) with myelopathy, cervical 05/27/2020   Cervical Myelopathy with Spinal cord compression (HCC)  --> s/p Anterior Cervical Discectomy Decompression)    Hypothyroidism    IDA (iron deficiency anemia)    Kidney damage    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   UGIB (upper gastrointestinal bleed) 04/25/2015   Venous stasis of both lower extremities 01/10/2019   Past Surgical History:  Procedure Laterality Date   ABDOMINAL HYSTERECTOMY     ANTERIOR CERVICAL DECOMP/DISCECTOMY FUSION Left 05/26/2020   Procedure: Cervical seven - Thoracic one ANTERIOR CERVICAL DISCECTOMY AND PLATING;  Surgeon: Newman Pies, MD;  Location: [facility];  Service: Neurosurgery;  Laterality: Left;   CHOLECYSTECTOMY     COSMETIC SURGERY     extra fat taken off arms and eyelid lifted    ESOPHAGOGASTRODUODENOSCOPY N/A 04/25/2015   Procedure: ESOPHAGOGASTRODUODENOSCOPY (EGD);  Surgeon: Jerene Bears, MD;  Location: [facility] ENDOSCOPY;  Service: Endoscopy;  Laterality: N/A;   FRACTURE SURGERY     GASTRIC BYPASS  2003   gastric ulcer repair  04/25/2015   NM MYOVIEW LTD  10/10/2017   Normal LV size and function EF 72%.  Medium sized mild severity defect in the apical septal apical lateral and apical wall suggestive of breast attenuation.  LOW RISK.  No ischemia or infarction.     OPEN REDUCTION INTERNAL FIXATION (ORIF) DISTAL RADIAL FRACTURE Right 01/15/2013   Procedure: OPEN REDUCTION INTERNAL FIXATION (ORIF) Right DISTAL RADIUS FRACTURE;  Surgeon: Marybelle Killings, MD;  Location: [facility];  Service: Orthopedics;  Laterality: Right;   SPINE SURGERY     TRANSTHORACIC ECHOCARDIOGRAM  10/04/2017   Mild LVH.  EF 60-65%.  Grade 2/moderate diastolic dysfunction.  Mild pulmonary hypertension.  No valve lesions   VENTRAL HERNIA REPAIR N/A 12/02/2020   Procedure: HERNIA REPAIR INCISIONAL  ADULT, open;  Surgeon: Jules Husbands, MD;  Location: [facility]S;  Service: General;  Laterality: N/A;   Social History   Socioeconomic History   Marital status: Married    Spouse name: Not on file   Number of children: 4   Years of education: Not on file   Highest education level: Not on file  Occupational History   Occupation: RN    Comment: ICU Tree surgeon  Tobacco Use   Smoking  status: Never   Smokeless tobacco: Never  Vaping Use   Vaping Use: Never used  Substance and Sexual Activity   Alcohol use: Yes    Alcohol/week: 28.0 standard drinks of alcohol    Types: 28 Glasses of wine per week    Comment: drinks daily and has a bottle and half of wine per day   Drug use: No   Sexual activity: Yes    Birth control/protection: Post-menopausal  Other Topics Concern   Not on file  Social History Narrative   Marital status: married      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      Alcohol: drinks bottle of  wine daily in 2016   Takes care of her 88 y/o mom at home   Social Determinants of Health   Financial Resource Strain: Low Risk  (11/29/2021)   Overall Financial Resource Strain (CARDIA)    Difficulty of Paying Living Expenses: Not hard at all  Food Insecurity: No Food Insecurity (11/29/2021)   Hunger Vital Sign    Worried About Running Out of Food in the Last Year: Never true    [place] in the Last Year: Never true  Transportation Needs: No Transportation Needs (11/29/2021)   PRAPARE - Hydrologist (Medical): No    Lack of Transportation (Non-Medical): No  Physical Activity: Insufficiently Active (11/29/2021)   Exercise Vital Sign    Days of Exercise per Week: 1 day    Minutes of Exercise per Session: 30 min  Stress: No Stress Concern Present (11/29/2021)   [place]    Feeling of Stress : Not at all  Social Connections: Moderately Isolated (11/29/2021)   Social Connection and Isolation Panel [NHANES]    Frequency of Communication with Friends and Family: More than three times a week    Frequency of Social Gatherings with Friends and Family: Once a week    Attends Religious Services: Never    Marine scientist or Organizations: No    Attends Archivist Meetings: Never    Marital Status: Married  Human resources officer Violence: Not At Risk (11/29/2021)   Humiliation, Afraid, Rape, and Kick questionnaire    Fear of Current or Ex-Partner: No    Emotionally Abused: No    Physically Abused: No    Sexually Abused: No     Medications: Outpatient Medications Prior to Visit  Medication Sig   acetaminophen (TYLENOL) 500 MG tablet Take 500 mg by mouth every 6 (six) hours as needed.   benazepril-hydrochlorthiazide (LOTENSIN HCT) 20-25 MG tablet TAKE 1 TABLET BY MOUTH DAILY.   carvedilol (COREG) 6.25 MG tablet Take 1 tablet (6.25 mg total) by mouth 2 (two) times daily with a meal.    Cholecalciferol (VITAMIN D3) 125 MCG (5000 UT) TABS Take 3 tablets (15,000 Units total) by mouth See admin instructions. Takes 15000 units 5 days a week and 20000 units on 2 days. (Patient taking differently: Take 15,000 Units by mouth See admin instructions. Takes 15,000 units 5 days a week and 20,000 units on 2 days.)   Cyanocobalamin (VITAMIN B-12) 2500 MCG SUBL Place 2,500 mcg under the tongue once a week.   cyclobenzaprine (FLEXERIL) 10 MG tablet TAKE 1 TABLET BY MOUTH DAILY AS NEEDED FOR MUSCLE SPASMS.   diphenhydrAMINE (BENADRYL) 25 MG tablet Take 25 mg by mouth daily as needed (Allergies).   ferrous sulfate 325 (65 FE) MG tablet Take 1  tablet (325 mg total) by mouth daily with breakfast.   furosemide (LASIX) 20 MG tablet Take 20 mg by mouth as needed. Take and additional if needed for fluid   gabapentin (NEURONTIN) 300 MG capsule TAKE 4 CAPSULES BY MOUTH EVERY MORNING, 3 CAPSULES IN THE AFTERNOON, AND 4 CAPSULES AT NIGHT   levothyroxine (SYNTHROID) 125 MCG tablet TAKE 1 TABLET (125 MCG TOTAL) BY MOUTH ONCE DAILY EVERY MORNING BEFORE BREAKFAST   meloxicam (MOBIC) 15 MG tablet TAKE 1 TABLET (15 MG TOTAL) BY MOUTH DAILY.   Mupirocin 2 % KIT Apply 1 application topically daily as needed (Wound).   pantoprazole (PROTONIX) 40 MG tablet TAKE 1 TABLET (40 MG TOTAL) BY MOUTH DAILY.   rosuvastatin (CRESTOR) 5 MG tablet TAKE 1 TABLET BY MOUTH DAILY.   traMADol (ULTRAM) 50 MG tablet TAKE 1 TABLET BY MOUTH EVERY 12 HOURS AS NEEDED FOR PAIN. MAX 6 TABLETS PER DAY AS DIRECTED BY PRESCRIBER   traZODone (DESYREL) 50 MG tablet TAKE 1/2 TO 1 TABLET BY MOUTH AT BEDTIME AS NEEDED FOR SLEEP.   No facility-administered medications prior to visit.    Review of Systems Review of Systems:  A fourteen system review of systems was performed and found to be positive as per HPI.  Last CBC Lab Results  Component Value Date   WBC 6.6 05/16/2022   HGB 12.4 05/16/2022   HCT 35.6 05/16/2022   MCV 104 (H) 05/16/2022    MCH 36.3 (H) 05/16/2022   RDW 12.8 05/16/2022   PLT 241 00/93/8182   Last metabolic panel Lab Results  Component Value Date   GLUCOSE 89 05/16/2022   NA 131 (L) 05/16/2022   K 4.9 05/16/2022   CL 91 (L) 05/16/2022   CO2 25 05/16/2022   BUN 20 05/16/2022   CREATININE 1.08 (H) 05/16/2022   EGFR 54 (L) 05/16/2022   CALCIUM 9.3 05/16/2022   PHOS 3.8 05/25/2020   PROT 6.4 05/16/2022   ALBUMIN 4.0 05/16/2022   LABGLOB 2.4 05/16/2022   AGRATIO 1.7 05/16/2022   BILITOT 0.5 05/16/2022   ALKPHOS 103 05/16/2022   AST 25 05/16/2022   ALT 28 05/16/2022   ANIONGAP 9 05/31/2020   Last lipids Lab Results  Component Value Date   CHOL 146 11/16/2020   HDL 62 11/16/2020   LDLCALC 52 11/16/2020   LDLDIRECT 74 08/18/2021   TRIG 198 (H) 11/16/2020   CHOLHDL 2.4 11/16/2020   Last hemoglobin A1c Lab Results  Component Value Date   HGBA1C 5.4 05/16/2022   Last thyroid functions Lab Results  Component Value Date   TSH 0.725 05/16/2022   T3TOTAL 59 (L) 08/18/2021   Last vitamin D Lab Results  Component Value Date   VD25OH 44.6 05/16/2022    Objective    BP 131/75 (BP Location: Left Arm, Patient Position: Sitting, Cuff Size: Large)   Pulse 74   Resp 18   Ht _0  (1.651 m)   Wt 233 lb (105.7 kg)   SpO2 99%   BMI 38.77 kg/m  BP Readings from Last 3 Encounters:  10/09/22 131/75  09/25/22 134/72  06/02/22 (!) 164/82   Wt Readings from Last 3 Encounters:  10/09/22 233 lb (105.7 kg)  09/25/22 233 lb 12.8 oz (106.1 kg)  06/02/22 233 lb 8 oz (105.9 kg)    Physical Exam   General Appearance:     Alert, cooperative, in no acute distress, appears stated age   Head:    Normocephalic, without obvious abnormality, atraumatic  Eyes:  PERRL, conjunctiva/corneas clear, EOM's intact, fundi    benign, both eyes  Ears:    Normal TM's and external ear canals, both ears  Nose:   Nares normal, septum midline, mucosa normal, no drainage    or sinus tenderness  Throat:   Lips,  mucosa, and tongue normal; teeth and gums normal  Neck:   Supple, symmetrical, trachea midline, no adenopathy;    thyroid:  non-palpable, no tenderness; no JVD  Back:     Symmetric, no curvature, ROM normal, no CVA tenderness  Lungs:     Clear to auscultation bilaterally, respirations unlabored  Chest Wall:    No tenderness or deformity   Heart:    Normal heart rate. Normal rhythm.  Systolic murmur  Breast Exam:    deferred  Abdomen:     Soft, non-tender, bowel sounds active all four quadrants,    no masses, no organomegaly  Pelvic:    not indicated; status post hysterectomy, negative ROS  Extremities:   All extremities are intact.+venous stasis, right lower leg wound dressing in place  Pulses:   2+ and symmetric UE  Skin:   Skin color, texture, turgor normal, no suspicious lesions  Lymph nodes:   Cervical, supraclavicular nodes normal  Neurologic:   CNII-XII grossly intact.     Last depression screening scores    05/16/2022   10:48 AM 11/29/2021   11:44 AM 08/18/2021    2:37 PM  PHQ 2/9 Scores  PHQ - 2 Score 0 0 0  PHQ- 9 Score _0 Last fall risk screening    05/16/2022   10:42 AM  Fall Risk   Falls in the past year? 1  Number falls in past yr: 1  Injury with Fall? 1  Risk for fall due to : Impaired balance/gait  Follow up Falls evaluation completed;Education provided     No results found for any visits on 10/09/22.  Assessment & Plan    Routine Health Maintenance and Physical Exam  Exercise Activities and Dietary recommendations -A heart healthy diet low in fat and carbohydrates.   Immunization History  Administered Date(s) Administered   Fluad Quad(high Dose 65+) 09/03/2019, 11/16/2020   Influenza, High Dose Seasonal PF 09/25/2017, 08/09/2021   Influenza,inj,Quad PF,6+ Mos 01/10/2017   Moderna Covid-19 Vaccine Bivalent Booster 60yr & up 08/09/2021   Moderna Sars-Covid-2 Vaccination 08/09/2021   PFIZER(Purple Top)SARS-COV-2 Vaccination 01/10/2020,  02/04/2020, 09/01/2020, 02/26/2021   Td 11/27/2013   Tdap 12/05/2018   Unspecified SARS-COV-2 Vaccination 09/01/2022   Zoster Recombinat (Shingrix) 12/29/2020, 08/18/2021    Health Maintenance  Topic Date Due   Hepatitis C Screening  Never done   Pneumonia Vaccine 76 Years old (1 - PCV) Never done   INFLUENZA VACCINE  06/27/2022   Medicare Annual Wellness (AWV)  11/29/2022   COVID-19 Vaccine (7 - Pfizer risk series) 10/27/2022   COLONOSCOPY (Pts 45-422yrInsurance coverage will need to be confirmed)  04/11/2025   TETANUS/TDAP  12/05/2028   DEXA SCAN  Completed   Zoster Vaccines- Shingrix  Completed   HPV VACCINES  Aged Out    Discussed health benefits of physical activity, and encouraged her to engage in regular exercise appropriate for her age and condition.  Problem List Items Addressed This Visit       Other   Alcohol use   Other Visit Diagnoses     Healthcare maintenance    -  Primary   Encounter for screening mammogram for malignant neoplasm of breast  Relevant Orders   MM Digital Screening      Advised to schedule lab visit for routine fasting labs. Pt agreeable to flu vaccine. Deferred Hep C screening. Discussed pneumonia vaccine. Order placed for screening mammogram. Continue current medication regimen. Recommend to reduce alcohol use and eventually quit. Continue to follow-up with wound care. Follow-up in 4 months for reg OV- HTN, neuropathy, thyroid  Return in about 4 months (around 02/07/2023) for HTN, neuropathy, thyroid; lab visit this week for FBW .       Lorrene Reid, PA-C  [facility] [phone] (phone) [phone] (fax)  [place]

## 2022-10-09 NOTE — Progress Notes (Signed)
DOLLIE, BRESSI ([id number]) [id number] Page 1 of 8 Visit Report for 10/05/2022 Allergy List Details Patient Name: Date of Service: [name], [name] 10/05/2022 1:30 PM Medical Record Number: [id number] Patient Account Number: [id number] Date of Birth/Sex: Treating RN: [name] (76 y.o. [name] Primary Care [name]: [name] Other Clinician: Referring [name]: Treating [name]/Extender: [name] Weeks in Treatment: 0 Allergies Active Allergies cocoa Reaction: anaphylaxis Severity: Severe red dye Reaction: hives Severity: Severe Lyrica Reaction: nose bleeds, severe bleeding Severity: Severe Sulfa (Sulfonamide Antibiotics) Reaction: rash Severity: Moderate Allergy Notes Electronic Signature(s) Signed: 10/09/2022 5:04:49 PM By: [name] Ard RN Entered By: [name] on 10/05/2022 13:42:37 -------------------------------------------------------------------------------- Arrival Information Details Patient Name: Date of Service: [name], [name] 10/05/2022 1:30 PM Medical Record Number: [id number] Patient Account Number: [id number] Date of Birth/Sex: Treating RN: [name] (76 y.o. F) Primary Care Essense Bousquet: [name] Other Clinician: Referring Seneca Gadbois: Treating Jaqulyn Chancellor/Extender: [name] in Treatment: 0 Visit Information Patient Arrived: Cane Arrival Time: 13:17 Accompanied By: husband Transfer Assistance: None Patient Identification Verified: Yes Secondary Verification Process Completed: Yes Patient Requires Transmission-Based Precautions: No Patient Has Alerts: No History Since Last Visit All ordered tests and consults were completed: No Added or deleted any medications: No Any new allergies or adverse reactions: No Had a fall or experienced change in activities of daily living that may affect risk of falls: No Signs or symptoms of abuse/neglect  since last visito No Hospitalized since last visit: No Electronic Signature(s) [name] ([id number]) [id number] Page 2 of 8 Signed: 10/05/2022 2:10:52 PM By: [name] Entered By: [name] on 10/05/2022 13:18:18 -------------------------------------------------------------------------------- Clinic Level of Care Assessment Details Patient Name: Date of Service: [name], [name] 10/05/2022 1:30 PM Medical Record Number: [id number] Patient Account Number: [id number] Date of Birth/Sex: Treating RN: [name] (76 y.o. [name] Primary Care Janye Maynor: [name] Other Clinician: Referring Keondrick Dilks: Treating Sriya Kroeze/Extender: [name] in Treatment: 0 Clinic Level of Care Assessment Items TOOL 1 Quantity Score X- 1 0 Use when EandM and Procedure is performed on INITIAL visit ASSESSMENTS - Nursing Assessment / Reassessment X- 1 20 General Physical Exam (combine w/ comprehensive assessment (listed just below) when performed on new pt. evals) X- 1 25 Comprehensive Assessment (HX, ROS, Risk Assessments, Wounds Hx, etc.) ASSESSMENTS - Wound and Skin Assessment / Reassessment X- 1 10 Dermatologic / Skin Assessment (not related to wound area) ASSESSMENTS - Ostomy and/or Continence Assessment and Care []  - 0 Incontinence Assessment and Management []  - 0 Ostomy Care Assessment and Management (repouching, etc.) PROCESS - Coordination of Care X - Simple Patient / Family Education for ongoing care 1 15 []  - 0 Complex (extensive) Patient / Family Education for ongoing care X- 1 10 Staff obtains , Records, T Results / Process Orders est X- 1 10 Staff telephones HHA, Nursing Homes / Clarify orders / etc []  - 0 Routine Transfer to another Facility (non-emergent condition) []  - 0 Routine Hospital Admission (non-emergent condition) []  - 0 New Admissions / / Ordering NPWT  Apligraf, etc. , []  - 0 Emergency Hospital Admission (emergent condition) PROCESS - Special Needs []  - 0 Pediatric / Minor Patient Management []  - 0 Isolation Patient Management []  - 0 Hearing / Language / Visual special needs []  - 0 Assessment of Community assistance (transportation, D/C planning, etc.) []  - 0 Additional assistance / Altered mentation []  - 0 Support Surface(s) Assessment (bed, cushion, seat, etc.) INTERVENTIONS - Miscellaneous []  -  0 External ear exam []  - 0 Patient Transfer (multiple staff / Civil Service fast streamer / Similar devices) []  - 0 Simple Staple / Suture removal (25 or less) []  - 0 Complex Staple / Suture removal (26 or more) []  - 0 Hypo/Hyperglycemic Management (do not check if billed separately) X- 1 15 Ankle / Brachial Index (ABI) - do not check if billed separately [name], [name] ([id number]) [id number] Page 3 of 8 Has the patient been seen at the hospital within the last three years: Yes Total Score: 105 Level Of Care: New/Established - Level 3 Electronic Signature(s) Signed: 10/09/2022 5:04:49 PM By: [name] East RN Entered By: [name] on 10/05/2022 16:30:34 -------------------------------------------------------------------------------- Encounter Discharge Information Details Patient Name: Date of Service: [name], [name] [place]. 10/05/2022 1:30 PM Medical Record Number: [id number] Patient Account Number: [id number] Date of Birth/Sex: Treating RN: [name][birth year] (76 y.o. [name] Primary Care Dosia Yodice: Lorrene Reid Other Clinician: Referring Teige Rountree: Treating Mathhew Buysse/Extender: Casandra Doffing in Treatment: 0 Encounter Discharge Information Items Post Procedure Vitals Discharge Condition: Stable Temperature (F): 98.2 Ambulatory Status: Cane Pulse (bpm): 71 Discharge Destination: Home Respiratory Rate (breaths/min): 20 Transportation: Private Auto Blood Pressure (mmHg):  131/81 Accompanied By: husband Schedule Follow-up Appointment: Yes Clinical Summary of Care: Electronic Signature(s) Signed: 10/09/2022 5:04:49 PM By: [name] East RN Entered By: [name] on 10/05/2022 16:31:47 -------------------------------------------------------------------------------- Lower Extremity Assessment Details Patient Name: Date of Service: CHADE, MONACELLI H. 10/05/2022 1:30 PM Medical Record Number: [id number] Patient Account Number: [id number] Date of Birth/Sex: Treating RN: [name][birth year] (76 y.o. [name] Primary Care Luisdavid Hamblin: Lorrene Reid Other Clinician: Referring Jakki Doughty: Treating Shaynna Husby/Extender: Lourena Simmonds Weeks in Treatment: 0 Edema Assessment Assessed: [Left: No] [Right: No] [Left: Edema] [Right: :] Calf Left: Right: Point of Measurement: From Medial Instep 38.2 cm Ankle Left: Right: Point of Measurement: From Medial Instep 21 cm Knee To Floor Left: Right: From Medial Instep 51 cm Vascular Assessment Left: [[id number] Page 4 of 8Right:] Pulses: Dorsalis Pedis Palpable: [[id number] Page 4 of 8Yes] Doppler Audible: [[id number] Page 4 of 8Yes] Blood Pressure: Brachial: [[id number].pdf Page 4 of 8131] Ankle: [[id number].pdf Page 4 of 8Dorsalis Pedis: 160 1.22] Electronic Signature(s) Signed: 10/09/2022 5:04:49 PM By: [name] East RN Entered By: [name] on 10/05/2022 14:01:53 -------------------------------------------------------------------------------- Multi Wound Chart Details Patient Name: Date of Service: [name], Clemie H. 10/05/2022 1:30 PM Medical Record Number: [id number] Patient Account Number: [id number] Date of Birth/Sex: Treating RN: [name][birth year] (76 y.o. F) Primary Care Ayzia Day: Lorrene Reid Other Clinician: Referring Daven Montz: Treating Chevy Virgo/Extender:  Casandra Doffing in Treatment: 0 Vital Signs Height(in): 65 Pulse(bpm): 60 Weight(lbs): 229 Blood Pressure(mmHg): 131/81 Body Mass Index(BMI): 38.1 Temperature(F): 98.2 Respiratory Rate(breaths/min): 20 [6:Photos:] [N/A:N/A] Right, Lateral Ankle N/A N/A Wound Location: Gradually Appeared N/A N/A Wounding Event: T be determined o N/A N/A Primary Etiology: Cataracts, Anemia, Lymphedema, N/A N/A Comorbid History: Congestive Heart Failure, Hypertension, Peripheral Venous Disease, Osteoarthritis, Neuropathy, Confinement Anxiety 09/25/2022 N/A N/A Date Acquired: 0 N/A N/A Weeks of Treatment: Open N/A N/A Wound Status: No N/A N/A Wound Recurrence: 1.2x1.1x0.1 N/A N/A Measurements L x W x D (cm) 1.037 N/A N/A A (cm) : rea 0.104 N/A N/A Volume (cm) : Full Thickness Without Exposed N/A N/A Classification: Support Structures Medium N/A N/A Exudate A mount: Serous N/A N/A Exudate Type: amber N/A N/A Exudate Color: Flat and Intact N/A N/A Wound Margin: Medium (34-66%) N/A N/A Granulation A mount: Pink N/A N/A Granulation Quality: Medium (34-66%) N/A N/A Necrotic A mount: Debridement -  Selective/Open Wound N/A N/A Debridement: Pre-procedure Verification/Time Out 14:16 N/A N/A Taken: Lidocaine 5% topical ointment N/A N/A Pain ControlVERBA, DEGEN ([id number]) [id number] Page 5 of 8 Necrotic/Eschar, [facility] N/A N/A Tissue Debrided: Non-Viable Tissue N/A N/A Level: 1.32 N/A N/A Debridement A (sq cm): rea Curette N/A N/A Instrument: Minimum N/A N/A Bleeding: Pressure N/A N/A Hemostasis A chieved: Procedure was tolerated well N/A N/A Debridement Treatment Response: 1.2x1.1x0.1 N/A N/A Post Debridement Measurements L x W x D (cm) 0.104 N/A N/A Post Debridement Volume: (cm) Dry/Scaly: Yes N/A N/A Periwound Skin Moisture: No Abnormality N/A N/A Temperature: Debridement N/A N/A Procedures  Performed: Treatment Notes Electronic Signature(s) Signed: 10/05/2022 2:25:44 PM By: Fredirick Maudlin MD FACS Entered By: Fredirick Maudlin on 10/05/2022 14:25:44 -------------------------------------------------------------------------------- Multi-Disciplinary Care Plan Details Patient Name: Date of Service: [name], [name] 10/05/2022 1:30 PM Medical Record Number: [id number] Patient Account Number: [id number] Date of Birth/Sex: Treating RN: [name][birth year] (76 y.o. [name] Primary Care Ascencion Coye: Lorrene Reid Other Clinician: Referring Kimisha Eunice: Treating Kealy Lewter/Extender: Lourena Simmonds Weeks in Treatment: 0 Active Inactive Electronic Signature(s) Signed: 10/09/2022 5:04:49 PM By: [name] East RN Entered By: [name] on 10/05/2022 14:25:02 -------------------------------------------------------------------------------- Pain Assessment Details Patient Name: Date of Service: [name], [name] 10/05/2022 1:30 PM Medical Record Number: [id number] Patient Account Number: [id number] Date of Birth/Sex: Treating RN: [birth year][name] (76 y.o. [name] Primary Care Trust Crago: Lorrene Reid Other Clinician: Referring Ruari Duggan: Treating Jasman Pfeifle/Extender: Lourena Simmonds Weeks in Treatment: 0 Active Problems Location of Pain Severity and Description of Pain Patient Has Paino No Site Locations [place], [place] H ([id number]) [id number] Page 6 of 8 Character of Pain Describe the Pain: Burning Pain Management and Medication Current Pain Management: Electronic Signature(s) Signed: 10/09/2022 5:04:49 PM By: [name] East RN Entered By: [name] on 10/05/2022 14:09:47 -------------------------------------------------------------------------------- Patient/Caregiver Education Details Patient Name: Date of Service: [name], [name] 11/9/2023andnbsp1:30 PM Medical Record Number: [id number] Patient  Account Number: [id number] Date of Birth/Gender: Treating RN: [birth year] [name] (75 y.o. [name] Primary Care Physician: Lorrene Reid Other Clinician: Referring Physician: Treating Physician/Extender: Casandra Doffing in Treatment: 0 Education Assessment Education Provided To: Patient Education Topics Provided Nutrition: Handouts: Elevated Blood Sugars: How Do They Affect Wound Healing Methods: Explain/Verbal Responses: Reinforcements needed, State content correctly [place]: o Handouts: Welcome T [facility] [place] o Methods: Explain/Verbal Responses: Reinforcements needed, State content correctly Wound Debridement: Handouts: Wound Debridement Methods: Explain/Verbal Responses: Reinforcements needed, State content correctly Electronic Signature(s) Signed: 10/09/2022 5:04:49 PM By: [name] East RN Entered By: [name] on 10/05/2022 15:05:51 [name] ([id number]) [id number] Page 7 of 8 -------------------------------------------------------------------------------- Wound Assessment Details Patient Name: Date of Service: [name], [name] 10/05/2022 1:30 PM Medical Record Number: [id number] Patient Account Number: [id number] Date of Birth/Sex: Treating RN: [birth year][name] (76 y.o. [name], [name] Primary Care Pat Sires: Lorrene Reid Other Clinician: Referring Luismiguel Lamere: Treating Maddyx Wieck/Extender: Lourena Simmonds Weeks in Treatment: 0 Wound Status Wound Number: 6 Primary T be determined o Etiology: Wound Location: Right, Lateral Ankle Wound Open Wounding Event: Gradually Appeared Status: Date Acquired: 09/25/2022 Comorbid Cataracts, Anemia, Lymphedema, Congestive Heart Failure, Weeks Of Treatment: 0 History: Hypertension, Peripheral Venous Disease, Osteoarthritis, Clustered Wound: No Neuropathy, Confinement Anxiety Photos Wound Measurements Length: (cm)  1.2 Width: (cm) 1.1 Depth: (cm) 0.1 Area: (cm) 1.037 Volume: (cm) 0.104 % Reduction in Area: % Reduction in Volume: Tunneling: No Undermining: No Wound Description Classification: Full Thickness Without Exposed Support Wound Margin:  Flat and Intact Exudate Amount: Medium Exudate Type: Serous Exudate Color: amber Structures Foul Odor After Cleansing: No Slough/Fibrino Yes Wound Bed Granulation Amount: Medium (34-66%) Granulation Quality: Pink Necrotic Amount: Medium (34-66%) Necrotic Quality: Adherent Slough Periwound Skin Texture Texture Color No Abnormalities Noted: No No Abnormalities Noted: No Moisture Temperature / Pain No Abnormalities Noted: No Temperature: No Abnormality Dry / Scaly: Yes Treatment Notes Wound #6 (Ankle) Wound Laterality: Right, Lateral Cleanser Soap and Water Discharge Instruction: May shower and wash wound with dial antibacterial soap and water prior to dressing change. [name], [name] ([id number]) [id number] Page 8 of 8 Peri-Wound Care Sween Lotion (Moisturizing lotion) Discharge Instruction: Apply moisturizing lotion as directed Topical Primary Dressing Promogran Prisma Matrix, 4.34 (sq in) (silver collagen) Discharge Instruction: Moisten collagen with saline or hydrogel Secondary Dressing ABD Pad, 8x10 Discharge Instruction: Apply over primary dressing as directed. Secured With Transpore Surgical Tape, 2x10 (in/yd) Discharge Instruction: Secure dressing with tape as directed. Compression Wrap ThreePress (3 layer compression wrap) Discharge Instruction: Apply three layer compression as directed. Compression Stockings Add-Ons Electronic Signature(s) Signed: 10/09/2022 5:04:49 PM By: [name] East RN Entered By: [name] on 10/05/2022 14:09:15 -------------------------------------------------------------------------------- Vitals Details Patient Name: Date of Service: [name], [place].  10/05/2022 1:30 PM Medical Record Number: [id number] Patient Account Number: [id number] Date of Birth/Sex: Treating RN: [birth year] (76 y.o. F) Primary Care Ramona Slinger: Lorrene Reid Other Clinician: Referring Karem Tomaso: Treating Cedric Denison/Extender: Casandra Doffing in Treatment: 0 Vital Signs Time Taken: 01:15 Temperature (F): 98.2 Height (in): 65 Pulse (bpm): 71 Weight (lbs): 229 Respiratory Rate (breaths/min): 20 Body Mass Index (BMI): 38.1 Blood Pressure (mmHg): 131/81 Reference Range: 80 - 120 mg / dl Electronic Signature(s) Signed: 10/05/2022 2:10:52 PM By: Worthy Rancher Entered By: Worthy Rancher on 10/05/2022 13:18:56

## 2022-10-09 NOTE — Progress Notes (Signed)
TKAI, LARGE ([id number]) [id number] Page 1 of 4 Visit Report for 10/05/2022 Abuse Risk Screen Details Patient Name: Date of Service: [name], [name] 10/05/2022 1:30 PM Medical Record Number: [id number] Patient Account Number: [id number] Date of Birth/Sex: Treating RN: 08/12/1946 (76 y.o. [name] Primary Care [name]: Mayer Masker Other Clinician: Referring Brittinee Risk: Treating Auryn Paige/Extender: [name] in Treatment: 0 Abuse Risk Screen Items Answer ABUSE RISK SCREEN: Has anyone close to you tried to hurt or harm you recentlyo No Do you feel uncomfortable with anyone in your familyo No Has anyone forced you do things that you didnt want to doo No Electronic Signature(s) Signed: 10/09/2022 5:04:49 PM By: [name] Ard RN Entered By: [name] on 10/05/2022 13:46:14 -------------------------------------------------------------------------------- Activities of Daily Living Details Patient Name: Date of Service: [name], [name] 10/05/2022 1:30 PM Medical Record Number: [id number] Patient Account Number: [id number] Date of Birth/Sex: Treating RN: 22-Dec-1945 (76 y.o. [name] Primary Care [name]: Mayer Masker Other Clinician: Referring Melanny Wire: Treating [name]/Extender: [name] Weeks in Treatment: 0 Activities of Daily Living Items Answer Activities of Daily Living (Please select one for each item) Drive Automobile Not Able T Medications ake Completely Able Use T elephone Completely Able Care for Appearance Completely Able Use T oilet Completely Able Bath / Shower Completely Able Dress Self Completely Able Feed Self Completely Able Walk Need Assistance Get In / Out Bed Completely Able Housework Need Assistance Prepare Meals Need Assistance Handle Money Need Assistance Shop for Self Need Assistance Electronic Signature(s) Signed: 10/09/2022  5:04:49 PM By: [name] Ard RN Entered By: [name] on 10/05/2022 13:48:49 [name] ([id number]) [id number] Page 2 of 4 -------------------------------------------------------------------------------- Education Screening Details Patient Name: Date of Service: [name], [name] 10/05/2022 1:30 PM Medical Record Number: [id number] Patient Account Number: [id number] Date of Birth/Sex: Treating RN: [birth year] (76 y.o. [name] Primary Care [name]: Mayer Masker Other Clinician: Referring [name]: Treating [name]/Extender: [name] in Treatment: 0 Primary Learner Assessed: Patient Learning Preferences/Education Level/Primary Language Learning Preference: Explanation Highest Education Level: College or Above Preferred Language: English Cognitive Barrier Language Barrier: No Translator Needed: No Memory Deficit: No Emotional Barrier: No Cultural/Religious Beliefs Affecting Medical Care: No Physical Barrier Impaired Vision: Yes Glasses Impaired Hearing: No Decreased Hand dexterity: No Knowledge/Comprehension Knowledge Level: High Comprehension Level: High Ability to understand written instructions: High Ability to understand verbal instructions: High Motivation Anxiety Level: Calm Cooperation: Cooperative Education Importance: Acknowledges Need Interest in Health Problems: Asks Questions Perception: Coherent Willingness to Engage in Self-Management High Activities: Readiness to Engage in Self-Management High Activities: Electronic Signature(s) Signed: 10/09/2022 5:04:49 PM By: [name] Ard RN Entered By: [name] on 10/05/2022 13:49:54 -------------------------------------------------------------------------------- Fall Risk Assessment Details Patient Name: Date of Service: [name], [name]. 10/05/2022 1:30 PM Medical Record Number: [id number] Patient Account Number:  [id number] Date of Birth/Sex: Treating RN: September 18, 1946 (76 y.o. [name] Primary Care [name]: Mayer Masker Other Clinician: Referring [name]: Treating [name]/Extender: [name] Weeks in Treatment: 0 Fall Risk Assessment Items Have you had 2 or more falls in the last 12 monthso 0 Yes Vandevander, [name] ([id number]) [phone] [id number].pdf Page 3 of 4 Have you had any fall that resulted in injury in the last 12 monthso 0 Yes FALLS RISK SCREEN History of falling - immediate or within 3 months 0 No Secondary diagnosis (Do you have 2 or more medical diagnoseso) 0 No Ambulatory aid None/bed rest/wheelchair/nurse 0 No Crutches/cane/walker  15 Yes Furniture 0 No Intravenous therapy Access/Saline/Heparin Lock 0 No Gait/Transferring Normal/ bed rest/ wheelchair 0 No Weak (short steps with or without shuffle, stooped but able to lift head while walking, may seek 10 Yes support from furniture) Impaired (short steps with shuffle, may have difficulty arising from chair, head down, impaired 0 No balance) Mental Status Oriented to own ability 0 Yes Electronic Signature(s) Signed: 10/09/2022 5:04:49 PM By: [name] Ard RN Entered By: [name] on 10/05/2022 13:50:43 -------------------------------------------------------------------------------- Foot Assessment Details Patient Name: Date of Service: [name], [name]. 10/05/2022 1:30 PM Medical Record Number: [id number] Patient Account Number: [id number] Date of Birth/Sex: Treating RN: [birth year] (76 y.o. [name] Primary Care Canisha Issac: Mayer Masker Other Clinician: Referring [name]: Treating [name]/Extender: [name] Weeks in Treatment: 0 Foot Assessment Items Site Locations + = Sensation present, - = Sensation absent, C = Callus, U = Ulcer R = Redness, W = Warmth, M = Maceration, PU = Pre-ulcerative lesion F = Fissure, S = Swelling,  D = Dryness Assessment Right: Left: Other Deformity: No No Prior Foot Ulcer: No No Prior Amputation: No No Charcot Joint: No No Ambulatory Status: Ambulatory With Help Assistance DeviceKEATON, STIREWALT [name] ([id number]) [id number] Page 4 of 4 Gait: Steady Electronic Signature(s) Signed: 10/09/2022 5:04:49 PM By: [name] Ard RN Entered By: [name] on 10/05/2022 13:53:42 -------------------------------------------------------------------------------- Nutrition Risk Screening Details Patient Name: Date of Service: [name], [name] 10/05/2022 1:30 PM Medical Record Number: [id number] Patient Account Number: [id number] Date of Birth/Sex: Treating RN: 11/13/1946 (76 y.o. [name], [name] Primary Care Derrek Puff: Mayer Masker Other Clinician: Referring Tyge Somers: Treating Jesse Hirst/Extender: [name] Weeks in Treatment: 0 Height (in): 65 Weight (lbs): 229 Body Mass Index (BMI): 38.1 Nutrition Risk Screening Items Score Screening NUTRITION RISK SCREEN: I have an illness or condition that made me change the kind and/or amount of food I eat 0 No I eat fewer than two meals per day 0 No I eat few fruits and vegetables, or milk products 0 No I have three or more drinks of beer, liquor or wine almost every day 2 Yes I have tooth or mouth problems that make it hard for me to eat 0 No I don't always have enough money to buy the food I need 0 No I eat alone most of the time 0 No I take three or more different prescribed or over-the-counter drugs a day 1 Yes Without wanting to, I have lost or gained 10 pounds in the last six months 0 No I am not always physically able to shop, cook and/or feed myself 0 No Nutrition Protocols Good Risk Protocol Moderate Risk Protocol 0 Provide education on nutrition High Risk Proctocol Risk Level: Moderate Risk Score: 3 Electronic Signature(s) Signed: 10/09/2022 5:04:49 PM By:  [name] Ard RN Entered By: [name] on 10/05/2022 13:51:55

## 2022-10-12 ENCOUNTER — Other Ambulatory Visit: Payer: PPO

## 2022-10-12 DIAGNOSIS — E039 Hypothyroidism, unspecified: Secondary | ICD-10-CM

## 2022-10-12 DIAGNOSIS — Z0000 Encounter for general adult medical examination without abnormal findings: Secondary | ICD-10-CM

## 2022-10-12 DIAGNOSIS — E782 Mixed hyperlipidemia: Secondary | ICD-10-CM | POA: Diagnosis not present

## 2022-10-12 DIAGNOSIS — I10 Essential (primary) hypertension: Secondary | ICD-10-CM | POA: Diagnosis not present

## 2022-10-13 ENCOUNTER — Encounter (HOSPITAL_BASED_OUTPATIENT_CLINIC_OR_DEPARTMENT_OTHER): Payer: PPO | Admitting: General Surgery

## 2022-10-13 DIAGNOSIS — L97312 Non-pressure chronic ulcer of right ankle with fat layer exposed: Secondary | ICD-10-CM | POA: Diagnosis not present

## 2022-10-13 LAB — CBC WITH DIFFERENTIAL/PLATELET
Basophils Absolute: 0.1 10*3/uL (ref 0.0–0.2)
Basos: 2 %
EOS (ABSOLUTE): 0.3 10*3/uL (ref 0.0–0.4)
Eos: 4 %
Hematocrit: 38.3 % (ref 34.0–46.6)
Hemoglobin: 12.6 g/dL (ref 11.1–15.9)
Immature Grans (Abs): 0 10*3/uL (ref 0.0–0.1)
Immature Granulocytes: 0 %
Lymphocytes Absolute: 3 10*3/uL (ref 0.7–3.1)
Lymphs: 43 %
MCH: 34 pg — ABNORMAL HIGH (ref 26.6–33.0)
MCHC: 32.9 g/dL (ref 31.5–35.7)
MCV: 103 fL — ABNORMAL HIGH (ref 79–97)
Monocytes Absolute: 0.7 10*3/uL (ref 0.1–0.9)
Monocytes: 9 %
Neutrophils Absolute: 3 10*3/uL (ref 1.4–7.0)
Neutrophils: 42 %
Platelets: 254 10*3/uL (ref 150–450)
RBC: 3.71 x10E6/uL — ABNORMAL LOW (ref 3.77–5.28)
RDW: 13.1 % (ref 11.7–15.4)
WBC: 7.1 10*3/uL (ref 3.4–10.8)

## 2022-10-13 LAB — COMPREHENSIVE METABOLIC PANEL
ALT: 40 IU/L — ABNORMAL HIGH (ref 0–32)
AST: 51 IU/L — ABNORMAL HIGH (ref 0–40)
Albumin/Globulin Ratio: 1.8 (ref 1.2–2.2)
Albumin: 3.9 g/dL (ref 3.8–4.8)
Alkaline Phosphatase: 134 IU/L — ABNORMAL HIGH (ref 44–121)
BUN/Creatinine Ratio: 14 (ref 12–28)
BUN: 12 mg/dL (ref 8–27)
Bilirubin Total: 0.3 mg/dL (ref 0.0–1.2)
CO2: 27 mmol/L (ref 20–29)
Calcium: 9.4 mg/dL (ref 8.7–10.3)
Chloride: 92 mmol/L — ABNORMAL LOW (ref 96–106)
Creatinine, Ser: 0.86 mg/dL (ref 0.57–1.00)
Globulin, Total: 2.2 g/dL (ref 1.5–4.5)
Glucose: 88 mg/dL (ref 70–99)
Potassium: 4.6 mmol/L (ref 3.5–5.2)
Sodium: 133 mmol/L — ABNORMAL LOW (ref 134–144)
Total Protein: 6.1 g/dL (ref 6.0–8.5)
eGFR: 70 mL/min/{1.73_m2} (ref >59)

## 2022-10-13 LAB — HEMOGLOBIN A1C
Est. average glucose Bld gHb Est-mCnc: 114 mg/dL
Hgb A1c MFr Bld: 5.6 % (ref 4.8–5.6)

## 2022-10-13 LAB — LIPID PANEL
Chol/HDL Ratio: 1.9 ratio (ref 0.0–4.4)
Cholesterol, Total: 169 mg/dL (ref 100–199)
HDL: 88 mg/dL (ref >39)
LDL Chol Calc (NIH): 63 mg/dL (ref 0–99)
Triglycerides: 104 mg/dL (ref 0–149)
VLDL Cholesterol Cal: 18 mg/dL (ref 5–40)

## 2022-10-13 LAB — TSH: TSH: 1 u[IU]/mL (ref 0.450–4.500)

## 2022-10-15 ENCOUNTER — Encounter: Payer: Self-pay | Admitting: Cardiology

## 2022-10-15 NOTE — Assessment & Plan Note (Signed)
Most of her symptoms are probably more related to venous stasis, lymphedema etc.  She Cornelius Moras combination of beta-blocker and benazepril and HCTZ.  She also uses as needed Lasix but has not used that much lately.  Unfortunately, because of her dizziness she also has not adequately hydrated and therefore start avoid excess Lasix.  Consider evaluation for sleep apnea.

## 2022-10-15 NOTE — Assessment & Plan Note (Signed)
>>  ASSESSMENT AND PLAN FOR VENOUS STASIS OF BOTH LOWER EXTREMITIES WRITTEN ON 10/15/2022  9:45 PM BY HARDING, DAVID W, MD  Left greater than right leg swelling with the right leg now has an ulcer.  Continue to recommend support stockings of elevation.  Call started by doing exercises.  The more she exercises, the last off her balance was.Marland Kitchen

## 2022-10-15 NOTE — Assessment & Plan Note (Signed)
Stable blood pressure on current medications. Continue current dose of carvedilol and Benicar and HCTZ.  She is drinking less alcohol, but also may contribute to hypertension.

## 2022-10-15 NOTE — Assessment & Plan Note (Signed)
Left greater than right leg swelling with the right leg now has an ulcer.  Continue to recommend support stockings of elevation.  Call started by doing exercises.  The more she exercises, the last off her balance was.Marland Kitchen

## 2022-10-16 NOTE — Progress Notes (Signed)
AMEENAH, SINGELTON ([id number]) [id number] Page 1 of 10 Visit Report for 10/13/2022 Chief Complaint Document Details Patient Name: Date of Service: [name], [name] 10/13/2022 1:00 PM Medical Record Number: [id number] Patient Account Number: [id number] Date of Birth/Sex: Treating RN: Jul 28, 1946 (76 y.o. F) Primary Care Provider: Lorrene Reid Other Clinician: Referring Provider: Treating Provider/Extender: Casandra Doffing in Treatment: 1 Information Obtained from: Patient Chief Complaint Bilateral LE Ulcers 10/05/2022: RLE ulcer Electronic Signature(s) Signed: 10/13/2022 2:19:36 PM By: Fredirick Maudlin MD FACS Entered By: Fredirick Maudlin on 10/13/2022 14:19:36 -------------------------------------------------------------------------------- Debridement Details Patient Name: Date of Service: [name], Mickala H. 10/13/2022 1:00 PM Medical Record Number: [id number] Patient Account Number: [id number] Date of Birth/Sex: Treating RN: [birth year] (76 y.o. [name], [name] Primary Care Provider: Lorrene Reid Other Clinician: Referring Provider: Treating Provider/Extender: Lourena Simmonds Weeks in Treatment: 1 Debridement Performed for Assessment: Wound #6 Right,Lateral Ankle Performed By: Physician Fredirick Maudlin, MD Debridement Type: Debridement Level of Consciousness (Pre-procedure): Awake and Alert Pre-procedure Verification/Time Out Yes - 13:48 Taken: Start Time: 13:48 Pain Control: Lidocaine 5% topical ointment T Area Debrided (L x W): otal 1.2 (cm) x 0.8 (cm) = 0.96 (cm) Tissue and other material debrided: Non-Viable, Slough, Slough Level: Non-Viable Tissue Debridement Description: Selective/Open Wound Instrument: Curette Bleeding: Minimum Hemostasis Achieved: Pressure Response to Treatment: Procedure was tolerated well Level of Consciousness (Post- Awake and Alert procedure): Post  Debridement Measurements of Total Wound Length: (cm) 1.2 Width: (cm) 0.8 Depth: (cm) 0.1 Volume: (cm) 0.075 Character of Wound/Ulcer Post Debridement: Requires Further Debridement Post Procedure Diagnosis Same as Pre-procedure WENGER, Matteson H ([id number]) [id number] Page 2 of 10 Notes Scribed for Dr. Celine Ahr by Blanche East, RN Electronic Signature(s) Signed: 10/13/2022 5:13:10 PM By: Fredirick Maudlin MD FACS Signed: 10/16/2022 4:43:27 PM By: Blanche East RN Entered By: Blanche East on 10/13/2022 13:52:11 -------------------------------------------------------------------------------- HPI Details Patient Name: Date of Service: [name], [name]. 10/13/2022 1:00 PM Medical Record Number: [id number] Patient Account Number: [id number] Date of Birth/Sex: Treating RN: 1946-08-21 (76 y.o. F) Primary Care Provider: Lorrene Reid Other Clinician: Referring Provider: Treating Provider/Extender: Casandra Doffing in Treatment: 1 History of Present Illness HPI Description: 07/09/18 on evaluation today patient presents for bilateral lower extremity ulcers. She has a past medical history significant for chronic venous stasis, hypertension, and lower extremity edema with associated skin changes. Unfortunately she tells me that roughly 3 weeks ago she noted ulcers on the left lower extremity which started given her trouble. Subsequently she ended up proceeding with applying mupirocin ointment and was keeping this open air for the most part. She states that it would scab over somewhat and then reopened. The wounds have been training quite significantly. Fortunately there does not appear to be any evidence of overt infection there is some erythematous type skin changes but I believe this is likely stasis dermatitis nothing more. Fortunately she has no evidence of any systemic infection. No fevers, chills, nausea, or vomiting noted at this time.  She has been recommended before to wear compression stockings but she does not wear them on a regular basis. She has never been suggested to get Juxta-Lite compression wraps she was not aware of what these were. Patient had venous studies performed on 11/09/17 which showed no lower surety DVT or deep vein insufficiency noted. She did not have evaluation however of the superficial veins. There was full compressibility noted throughout. Her arterial study which was performed on 11/18/17 actually showed that she has an ABI on  the right of 1.17 with a TBI of 1.59 along with an ABI of 1.12 on the left and a TBI of 0.99. 07/17/18 on evaluation today patient appears to be doing much better in regard to her left lower Trinity ulcers. She's been tolerating the dressing changes without complication. There does not appear to be any evidence of infection which is good news. Overall I'm very pleased with the progress at this time. 07/24/18 on evaluation today patient's wound actually appears to be completely healed at this time. Fortunately there does not appear to be any evidence of infection which is good news. Overall I'm very happy with the progress that has been made. She did get her Velcro compression wraps. READMISSION 12/31/2020 This is a patient we had in this clinic in 2019 felt to have chronic venous insufficiency wounds on her lower extremities. She was discharged with bilateral compression stockings. She tells [place] that she never really wore them or at best wore them sparingly. She is back in clinic today having a ulcer on her left lateral lower extremity since October and one on the right anterior for the last 2 weeks. I do not know that she is doing any primary dressing on these certainly not wearing any form of compression. She is already been evaluated by vein and vascular on 11/15/2020. They noted that she did have ulcers on the left leg in October that healed with a need Unna boot. She had a reflux  study on 12/20 that did not show evidence of a DVT or venous reflux bilaterally. They was not felt that she had arterial insufficiency based on the fact that she had brisk biphasic Doppler signals in the bilateral DP PT. The patient comes in today with superficial wounds on the right anterior mid tibia and the left lateral calf. These are small appear to have healthy surfaces. Past medical history includes cervical and upper thoracic spine surgery on 7/21, hernia repair early in January, hypertension, hyperlipidemia, hypothyroidism, gastric bypass surgery, DVT in the right peroneal artery and left popliteal and peroneal veins. Next problem CHF. She does not have diabetes ABIs in our clinic were 1.1 on the right and 1.2 on the lower 2/11; the patient's wound on the left lateral and right anterior lower leg are closed for edema control is good. She has an assortment of old juxta lite stockings with old liners. She does not like to wear anything in particular under the compression part of the juxta lite stocking. I think she is going to do exactly what she wants. READMISSION 10/05/2022 This is a 76 year old woman last seen in our clinic about 18 months ago. She has a history of venous stasis and stasis dermatitis. A reflux study done in 2021 did not show any evidence of venous reflux. She says about 3 weeks ago, she noticed an ulcer on her right lateral malleolus. She was applying triple antibiotic ointment to it for a while and then was soaking it in Epsom salts. She reports that she usually wears compression stockings, but that they were rubbing so she did not wear 1 on her right leg since the wound appeared, she has a stocking with the toe and heel cut out applied to her left leg. On examination, she has changes of chronic stasis dermatitis on the right. There is a shallow ulcer overlying the lateral malleolus. The fat layer is exposed. It is a little bit desiccated but fairly clean with just some  light eschar and slough present. No malodor  or purulent drainage. 10/13/2022: The wound is a little smaller today. Although the moisture balance is better than last week, it is still a bit dry. There is still some slough accumulation on the surface. Electronic Signature(s) Hanna, [place] H ([id number]) [id number] Page 3 of 10 Signed: 10/13/2022 2:20:18 PM By: Fredirick Maudlin MD FACS Entered By: Fredirick Maudlin on 10/13/2022 14:20:18 -------------------------------------------------------------------------------- Physical Exam Details Patient Name: Date of Service: [name], [name] Balo H. 10/13/2022 1:00 PM Medical Record Number: [id number] Patient Account Number: [id number] Date of Birth/Sex: Treating RN: 1946-10-07 (76 y.o. F) Primary Care Provider: Lorrene Reid Other Clinician: Referring Provider: Treating Provider/Extender: Lourena Simmonds Weeks in Treatment: 1 Constitutional Hypertensive, asymptomatic. . . . No acute distress.Marland Kitchen Respiratory Normal work of breathing on room air.. Notes 10/13/2022: The wound is a little smaller today. Although the moisture balance is better than last week, it is still a bit dry. There is still some slough accumulation on the surface. Electronic Signature(s) Signed: 10/13/2022 2:20:52 PM By: Fredirick Maudlin MD FACS Entered By: Fredirick Maudlin on 10/13/2022 14:20:52 -------------------------------------------------------------------------------- Physician Orders Details Patient Name: Date of Service: [name], [name] 10/13/2022 1:00 PM Medical Record Number: [id number] Patient Account Number: [id number] Date of Birth/Sex: Treating RN: 1946/09/05 (76 y.o. [name], [name] Primary Care Provider: Lorrene Reid Other Clinician: Referring Provider: Treating Provider/Extender: Casandra Doffing in Treatment: 1 Verbal / Phone Orders: No Diagnosis Coding ICD-10 Coding Code  Description [id number] Non-pressure chronic ulcer of right ankle with fat layer exposed I87.2 Venous insufficiency (chronic) (peripheral) 99991111 Chronic diastolic (congestive) heart failure E66.09 Other obesity due to excess calories Follow-up Appointments ppointment in 2 weeks. - Dr. Celine Ahr Rm 4 Return A Nurse Visit: - RM 4 10/17/22 at 11:30 AM Anesthetic (In clinic) Topical Lidocaine 5% applied to wound bed - in clinic, prior to debridement Bathing/ Shower/ Hygiene May shower with protection but do not get wound dressing(s) wet. - Keep dressing dry at all times until next appointment. Can purchase cast protector from [facility] or [facility] Edema Control - Lymphedema / SCD / Other Bilateral Lower Extremities NIVES, NAWABI ([id number]) [id number] Page 4 of 10 Elevate legs to the level of the heart or above for 30 minutes daily and/or when sitting, a frequency of: Avoid standing for long periods of time. Patient to wear own compression stockings every day. Moisturize legs daily. Wound Treatment Wound #6 - Ankle Wound Laterality: Right, Lateral Cleanser: Soap and Water Discharge Instructions: May shower and wash wound with dial antibacterial soap and water prior to dressing change. Peri-Wound Care: Sween Lotion (Moisturizing lotion) Discharge Instructions: Apply moisturizing lotion as directed Prim Dressing: Promogran Prisma Matrix, 4.34 (sq in) (silver collagen) ary Discharge Instructions: Moisten collagen with saline or hydrogel Secondary Dressing: ABD Pad, 8x10 Discharge Instructions: Apply over primary dressing as directed. Secured With: Transpore Surgical Tape, 2x10 (in/yd) Discharge Instructions: Secure dressing with tape as directed. Compression Wrap: ThreePress (3 layer compression wrap) Discharge Instructions: Apply three layer compression as directed. Electronic Signature(s) Signed: 10/13/2022 5:13:10 PM By: Fredirick Maudlin MD FACS Entered By:  Fredirick Maudlin on 10/13/2022 14:21:07 -------------------------------------------------------------------------------- Problem List Details Patient Name: Date of Service: [name], Jiali H. 10/13/2022 1:00 PM Medical Record Number: [id number] Patient Account Number: [id number] Date of Birth/Sex: Treating RN: [birth year] (76 y.o. F) Primary Care Provider: Lorrene Reid Other Clinician: Referring Provider: Treating Provider/Extender: Casandra Doffing in Treatment: 1 Active Problems ICD-10 Encounter Code Description Active Date MDM Diagnosis L97.312 Non-pressure chronic ulcer of right ankle with  fat layer exposed 10/05/2022 No Yes I87.2 Venous insufficiency (chronic) (peripheral) 10/05/2022 No Yes 99991111 Chronic diastolic (congestive) heart failure 10/05/2022 No Yes E66.09 Other obesity due to excess calories 10/05/2022 No Yes Inactive Problems Resolved Problems Electronic Signature(s) VERIDIANA, AZURE ([id number]) [id number] Page 5 of 10 Signed: 10/13/2022 2:18:19 PM By: Fredirick Maudlin MD FACS Entered By: Fredirick Maudlin on 10/13/2022 14:18:19 -------------------------------------------------------------------------------- Progress Note Details Patient Name: Date of Service: [name], Staley H. 10/13/2022 1:00 PM Medical Record Number: [id number] Patient Account Number: [id number] Date of Birth/Sex: Treating RN: 01-27-1946 (76 y.o. F) Primary Care Provider: Lorrene Reid Other Clinician: Referring Provider: Treating Provider/Extender: Casandra Doffing in Treatment: 1 Subjective Chief Complaint Information obtained from Patient Bilateral LE Ulcers 10/05/2022: RLE ulcer History of Present Illness (HPI) 07/09/18 on evaluation today patient presents for bilateral lower extremity ulcers. She has a past medical history significant for chronic venous stasis, hypertension, and lower extremity edema with  associated skin changes. Unfortunately she tells me that roughly 3 weeks ago she noted ulcers on the left lower extremity which started given her trouble. Subsequently she ended up proceeding with applying mupirocin ointment and was keeping this open air for the most part. She states that it would scab over somewhat and then reopened. The wounds have been training quite significantly. Fortunately there does not appear to be any evidence of overt infection there is some erythematous type skin changes but I believe this is likely stasis dermatitis nothing more. Fortunately she has no evidence of any systemic infection. No fevers, chills, nausea, or vomiting noted at this time. She has been recommended before to wear compression stockings but she does not wear them on a regular basis. She has never been suggested to get Juxta-Lite compression wraps she was not aware of what these were. Patient had venous studies performed on 11/09/17 which showed no lower surety DVT or deep vein insufficiency noted. She did not have evaluation however of the superficial veins. There was full compressibility noted throughout. Her arterial study which was performed on 11/18/17 actually showed that she has an ABI on the right of 1.17 with a TBI of 1.59 along with an ABI of 1.12 on the left and a TBI of 0.99. 07/17/18 on evaluation today patient appears to be doing much better in regard to her left lower Trinity ulcers. She's been tolerating the dressing changes without complication. There does not appear to be any evidence of infection which is good news. Overall I'm very pleased with the progress at this time. 07/24/18 on evaluation today patient's wound actually appears to be completely healed at this time. Fortunately there does not appear to be any evidence of infection which is good news. Overall I'm very happy with the progress that has been made. She did get her Velcro compression wraps. READMISSION 12/31/2020 This is a  patient we had in this clinic in 2019 felt to have chronic venous insufficiency wounds on her lower extremities. She was discharged with bilateral compression stockings. She tells [place] that she never really wore them or at best wore them sparingly. She is back in clinic today having a ulcer on her left lateral lower extremity since October and one on the right anterior for the last 2 weeks. I do not know that she is doing any primary dressing on these certainly not wearing any form of compression. She is already been evaluated by vein and vascular on 11/15/2020. They noted that she did have ulcers on the left leg in  October that healed with a need Unna boot. She had a reflux study on 12/20 that did not show evidence of a DVT or venous reflux bilaterally. They was not felt that she had arterial insufficiency based on the fact that she had brisk biphasic Doppler signals in the bilateral DP PT. The patient comes in today with superficial wounds on the right anterior mid tibia and the left lateral calf. These are small appear to have healthy surfaces. Past medical history includes cervical and upper thoracic spine surgery on 7/21, hernia repair early in January, hypertension, hyperlipidemia, hypothyroidism, gastric bypass surgery, DVT in the right peroneal artery and left popliteal and peroneal veins. Next problem CHF. She does not have diabetes ABIs in our clinic were 1.1 on the right and 1.2 on the lower 2/11; the patient's wound on the left lateral and right anterior lower leg are closed for edema control is good. She has an assortment of old juxta lite stockings with old liners. She does not like to wear anything in particular under the compression part of the juxta lite stocking. I think she is going to do exactly what she wants. READMISSION 10/05/2022 This is a 76 year old woman last seen in our clinic about 18 months ago. She has a history of venous stasis and stasis dermatitis. A reflux study done in  2021 did not show any evidence of venous reflux. She says about 3 weeks ago, she noticed an ulcer on her right lateral malleolus. She was applying triple antibiotic ointment to it for a while and then was soaking it in Epsom salts. She reports that she usually wears compression stockings, but that they were rubbing so she did not wear 1 on her right leg since the wound appeared, she has a stocking with the toe and heel cut out applied to her left leg. On examination, she has changes of chronic stasis dermatitis on the right. There is a shallow ulcer overlying the lateral malleolus. The fat layer is exposed. It is a little bit desiccated but fairly clean with just some light eschar and slough present. No malodor or purulent drainage. 10/13/2022: The wound is a little smaller today. Although the moisture balance is better than last week, it is still a bit dry. There is still some slough accumulation on the surface. Patient History Information obtained from Patient. Family History Cancer - Mother,Father, Diabetes - Father, Heart Disease - Father, Hypertension - Father, Stroke - Mother, [name], [name] ([id number]) [phone].pdf Page 6 of 10 No family history of Hereditary Spherocytosis, Kidney Disease, Lung Disease, Seizures, Thyroid Problems, Tuberculosis. Social History Never smoker, Marital Status - Married, Alcohol Use - Daily - 1.5 bottles of wine daily, Drug Use - No History, Caffeine Use - Daily - coffee. Medical History Eyes Patient has history of Cataracts - mild Denies history of Glaucoma, Optic Neuritis Ear/Nose/Mouth/Throat Denies history of Chronic sinus problems/congestion, Middle ear problems Hematologic/Lymphatic Patient has history of Anemia - iron deficiency, Lymphedema Denies history of Hemophilia, Human Immunodeficiency Virus, Sickle Cell Disease Respiratory Denies history of Aspiration, Asthma, Chronic Obstructive Pulmonary Disease (COPD),  Pneumothorax, Sleep Apnea, Tuberculosis Cardiovascular Patient has history of Congestive Heart Failure, Hypertension, Peripheral Venous Disease Denies history of Angina, Arrhythmia, Coronary Artery Disease, Deep Vein Thrombosis, Hypotension, Myocardial Infarction, Peripheral Arterial Disease, Phlebitis, Vasculitis Gastrointestinal Denies history of Cirrhosis , Colitis, Crohnoos, Hepatitis A, Hepatitis B, Hepatitis C Endocrine Denies history of Type I Diabetes, Type II Diabetes Genitourinary Denies history of End Stage Renal Disease Immunological Denies history of  Lupus Erythematosus, Raynaudoos, Scleroderma Integumentary (Skin) Denies history of History of Burn Musculoskeletal Patient has history of Osteoarthritis Denies history of Gout, Rheumatoid Arthritis, Osteomyelitis Neurologic Patient has history of Neuropathy - bila Denies history of Dementia, Quadriplegia, Paraplegia, Seizure Disorder Oncologic Denies history of Received Chemotherapy, Received Radiation Psychiatric Patient has history of Confinement Anxiety Denies history of Anorexia/bulimia Hospitalization/Surgery History - right arm surgery. - gastric bypass. - hysterectomy. - tonsillectomy. - hernia repair. - cervical myelopathy with spinal cord compression. Medical A Surgical History Notes nd Constitutional Symptoms (General Health) obesity Ear/Nose/Mouth/Throat seasonal allergies Cardiovascular aortic murmur, hyperlipidemia, hypertriglyceridemia Gastrointestinal UGI bleed, Endocrine hypothyroidism Objective Constitutional Hypertensive, asymptomatic. No acute distress.. Vitals Time Taken: 1:39 PM, Height: 65 in, Weight: 229 lbs, BMI: 38.1, Temperature: 98.0 F, Pulse: 60 bpm, Respiratory Rate: 18 breaths/min, Blood Pressure: 159/91 mmHg. Respiratory Normal work of breathing on room air.. General Notes: 10/13/2022: The wound is a little smaller today. Although the moisture balance is better than last  week, it is still a bit dry. There is still some slough accumulation on the surface. Integumentary (Hair, Skin) Wound #6 status is Open. Original cause of wound was Gradually Appeared. The date acquired was: 09/25/2022. The wound has been in treatment 1 weeks. The wound is located on the Right,Lateral Ankle. The wound measures 1.2cm length x 0.8cm width x 0.1cm depth; 0.754cm^2 area and 0.075cm^3 volume. There is no tunneling or undermining noted. There is a medium amount of serous drainage noted. The wound margin is flat and intact. There is medium (34-66%) pink granulation within the wound bed. There is a medium (34-66%) amount of necrotic tissue within the wound bed including Adherent Slough. The periwound skin appearance exhibited: Dry/Scaly. Periwound temperature was noted as No Abnormality. LULABELLE, DESTA ([id number]) [id number] Page 7 of 10 Assessment Active Problems ICD-10 Non-pressure chronic ulcer of right ankle with fat layer exposed Venous insufficiency (chronic) (peripheral) Chronic diastolic (congestive) heart failure Other obesity due to excess calories Procedures Wound #6 Pre-procedure diagnosis of Wound #6 is a T be determined located on the Right,Lateral Ankle . There was a Selective/Open Wound Non-Viable Tissue o Debridement with a total area of 0.96 sq cm performed by Duanne Guess, MD. With the following instrument(s): Curette to remove Non-Viable tissue/material. Material removed includes [facility] after achieving pain control using Lidocaine 5% topical ointment. No specimens were taken. A time out was conducted at 13:48, prior to the start of the procedure. A Minimum amount of bleeding was controlled with Pressure. The procedure was tolerated well. Post Debridement Measurements: 1.2cm length x 0.8cm width x 0.1cm depth; 0.075cm^3 volume. Character of Wound/Ulcer Post Debridement requires further debridement. Post procedure Diagnosis  Wound #6: Same as Pre-Procedure General Notes: Scribed for Dr. Lady Gary by Tommie Ard, RN. Pre-procedure diagnosis of Wound #6 is a T be determined located on the Right,Lateral Ankle . There was a Three Layer Compression Therapy Procedure by Ilsa Iha, RN. Post procedure Diagnosis Wound #6: Same as Pre-Procedure Plan Follow-up Appointments: Return Appointment in 2 weeks. - Dr. Lady Gary Rm 4 Nurse Visit: - RM 4 10/17/22 at 11:30 AM Anesthetic: (In clinic) Topical Lidocaine 5% applied to wound bed - in clinic, prior to debridement Bathing/ Shower/ Hygiene: May shower with protection but do not get wound dressing(s) wet. - Keep dressing dry at all times until next appointment. Can purchase cast protector from [facility] or [facility] Edema Control - Lymphedema / SCD / Other: Elevate legs to the level of the heart or above for 30 minutes daily  and/or when sitting, a frequency of: Avoid standing for long periods of time. Patient to wear own compression stockings every day. Moisturize legs daily. WOUND #6: - Ankle Wound Laterality: Right, Lateral Cleanser: Soap and Water Discharge Instructions: May shower and wash wound with dial antibacterial soap and water prior to dressing change. Peri-Wound Care: Sween Lotion (Moisturizing lotion) Discharge Instructions: Apply moisturizing lotion as directed Prim Dressing: Promogran Prisma Matrix, 4.34 (sq in) (silver collagen) ary Discharge Instructions: Moisten collagen with saline or hydrogel Secondary Dressing: ABD Pad, 8x10 Discharge Instructions: Apply over primary dressing as directed. Secured With: Transpore Surgical T ape, 2x10 (in/yd) Discharge Instructions: Secure dressing with tape as directed. Com pression Wrap: ThreePress (3 layer compression wrap) Discharge Instructions: Apply three layer compression as directed. 10/13/2022: The wound is a little smaller today. Although the moisture balance is better than last week, it is still a bit dry.  There is still some slough accumulation on the surface. I used a curette to debride the slough off of the wound surface. We will continue to use the Prisma silver collagen with liberal hydrogel for moisture. Continue 3 layer compression. Follow-up in 1 week. Electronic Signature(s) Signed: 10/13/2022 2:22:06 PM By: Fredirick Maudlin MD FACS Entered By: Fredirick Maudlin on 10/13/2022 14:22:05 [name] ([id number] Page 8 of 10 -------------------------------------------------------------------------------- HxROS Details Patient Name: Date of Service: [name], [name] 10/13/2022 1:00 PM Medical Record Number: [id number] Patient Account Number: [id number] Date of Birth/Sex: Treating RN: February 04, 1946 (76 y.o. F) Primary Care Provider: Lorrene Reid Other Clinician: Referring Provider: Treating Provider/Extender: Casandra Doffing in Treatment: 1 Information Obtained From Patient Constitutional Symptoms (General Health) Medical History: Past Medical History Notes: obesity Eyes Medical History: Positive for: Cataracts - mild Negative for: Glaucoma; Optic Neuritis Ear/Nose/Mouth/Throat Medical History: Negative for: Chronic sinus problems/congestion; Middle ear problems Past Medical History Notes: seasonal allergies Hematologic/Lymphatic Medical History: Positive for: Anemia - iron deficiency; Lymphedema Negative for: Hemophilia; Human Immunodeficiency Virus; Sickle Cell Disease Respiratory Medical History: Negative for: Aspiration; Asthma; Chronic Obstructive Pulmonary Disease (COPD); Pneumothorax; Sleep Apnea; Tuberculosis Cardiovascular Medical History: Positive for: Congestive Heart Failure; Hypertension; Peripheral Venous Disease Negative for: Angina; Arrhythmia; Coronary Artery Disease; Deep Vein Thrombosis; Hypotension; Myocardial Infarction; Peripheral Arterial Disease; Phlebitis; Vasculitis Past  Medical History Notes: aortic murmur, hyperlipidemia, hypertriglyceridemia Gastrointestinal Medical History: Negative for: Cirrhosis ; Colitis; Crohns; Hepatitis A; Hepatitis B; Hepatitis C Past Medical History Notes: UGI bleed, Endocrine Medical History: Negative for: Type I Diabetes; Type II Diabetes Past Medical History Notes: hypothyroidism Genitourinary Medical History: Negative for: End Stage Renal Disease Immunological Medical History: Negative for: Lupus Erythematosus; Raynauds; Scleroderma TESSLYN, MCCAGUE ([id number]) [id number] Page 9 of 10 Integumentary (Skin) Medical History: Negative for: History of Burn Musculoskeletal Medical History: Positive for: Osteoarthritis Negative for: Gout; Rheumatoid Arthritis; Osteomyelitis Neurologic Medical History: Positive for: Neuropathy - bila Negative for: Dementia; Quadriplegia; Paraplegia; Seizure Disorder Oncologic Medical History: Negative for: Received Chemotherapy; Received Radiation Psychiatric Medical History: Positive for: Confinement Anxiety Negative for: Anorexia/bulimia HBO Extended History Items Eyes: Cataracts Immunizations Pneumococcal Vaccine: Received Pneumococcal Vaccination: No Implantable Devices No devices added Hospitalization / Surgery History Type of Hospitalization/Surgery right arm surgery gastric bypass hysterectomy tonsillectomy hernia repair cervical myelopathy with spinal cord compression Family and Social History Cancer: Yes - Mother,Father; Diabetes: Yes - Father; Heart Disease: Yes - Father; Hereditary Spherocytosis: No; Hypertension: Yes - Father; Kidney Disease: No; Lung Disease: No; Seizures: No; Stroke: Yes - Mother; Thyroid Problems: No; Tuberculosis: No; Never smoker; Marital Status -  Married; Alcohol Use: Daily - 1.5 bottles of wine daily; Drug Use: No History; Caffeine Use: Daily - coffee; Financial Concerns: No; Food, Clothing or Shelter  Needs: No; Support System Lacking: No; Transportation Concerns: No Electronic Signature(s) Signed: 10/13/2022 5:13:10 PM By: Fredirick Maudlin MD FACS Entered By: Fredirick Maudlin on 10/13/2022 14:20:24 -------------------------------------------------------------------------------- SuperBill Details Patient Name: Date of Service: [name], Fedora. 10/13/2022 Medical Record Number: [id number] Patient Account Number: [id number] Date of Birth/Sex: Treating RN: 08/26/1946 (76 y.o. F) Primary Care Provider: Lorrene Reid Other Clinician: Referring Provider: Treating Provider/Extender: Lourena Simmonds [place], [place] H ([id number]) [id number] Page 10 of 10 Weeks in Treatment: 1 Diagnosis Coding ICD-10 Codes Code Description [id number] Non-pressure chronic ulcer of right ankle with fat layer exposed I87.2 Venous insufficiency (chronic) (peripheral) [id number] Chronic diastolic (congestive) heart failure E66.09 Other obesity due to excess calories Facility Procedures : CPT4 Code: [id number] Description: [id number] - DEBRIDE WOUND 1ST 20 SQ CM OR < ICD-10 Diagnosis Description L97.312 Non-pressure chronic ulcer of right ankle with fat layer exposed Modifier: Quantity: 1 Physician Procedures : CPT4 Code Description Modifier [id number] - WC PHYS LEVEL 3 - EST PT 25 ICD-10 Diagnosis Description L97.312 Non-pressure chronic ulcer of right ankle with fat layer exposed I87.2 Venous insufficiency (chronic) (peripheral) [id number] Chronic diastolic  (congestive) heart failure E66.09 Other obesity due to excess calories Quantity: 1 : [id number] - WC PHYS DEBR WO ANESTH 20 SQ CM ICD-10 Diagnosis Description L97.312 Non-pressure chronic ulcer of right ankle with fat layer exposed Quantity: 1 Electronic Signature(s) Signed: 10/13/2022 2:22:25 PM By: Fredirick Maudlin MD FACS Entered By: Fredirick Maudlin on 10/13/2022 14:22:25

## 2022-10-16 NOTE — Progress Notes (Signed)
[name], [name] ([id number]) [id number] Page 1 of 7 Visit Report for 10/13/2022 Arrival Information Details Patient Name: Date of Service: [name], [name] 10/13/2022 1:00 PM Medical Record Number: [id number] Patient Account Number: [id number] Date of Birth/Sex: Treating RN: August 10, 1946 (76 y.o. [name], [name] Primary Care [name]: [name] Other Clinician: Referring [name]: Treating [name]/Extender: [name] in Treatment: 1 Visit Information History Since Last Visit Added or deleted any medications: No Patient Arrived: [name] Any new allergies or adverse reactions: No Arrival Time: 13:39 Had a fall or experienced change in No Accompanied By: husband activities of daily living that may affect Transfer Assistance: None risk of falls: Patient Requires Transmission-Based Precautions: No Signs or symptoms of abuse/neglect since last visito No Patient Has Alerts: No Hospitalized since last visit: No Implantable device outside of the clinic excluding No cellular tissue based products placed in the center since last visit: Has Compression in Place as Prescribed: Yes Pain Present Now: No Electronic Signature(s) Signed: 10/16/2022 4:43:27 PM By: [name] Ard RN Entered By: [name] on 10/13/2022 13:39:30 -------------------------------------------------------------------------------- Compression Therapy Details Patient Name: Date of Service: [name], [name] 10/13/2022 1:00 PM Medical Record Number: [id number] Patient Account Number: [id number] Date of Birth/Sex: Treating RN: 26-Apr-1946 (76 y.o. [name] Primary Care [name]: [name] Other Clinician: Referring Laelynn Blizzard: Treating Macy Lingenfelter/Extender: [name] Weeks in Treatment: 1 Compression Therapy Performed for Wound Assessment: Wound #6 Right,Lateral Ankle Performed By: Clinician [name] Ard,  RN Compression Type: Three Layer Post Procedure Diagnosis Same as Pre-procedure Electronic Signature(s) Signed: 10/16/2022 4:43:27 PM By: [name] Ard RN Entered By: [name] on 10/13/2022 13:52:28 [name] ([id number]) [id number] Page 2 of 7 -------------------------------------------------------------------------------- Encounter Discharge Information Details Patient Name: Date of Service: [name], [name] 10/13/2022 1:00 PM Medical Record Number: [id number] Patient Account Number: [id number] Date of Birth/Sex: Treating RN: 1945/12/02 (76 y.o. [name] Primary Care Jahniyah Revere: [name] Other Clinician: Referring Fallon Howerter: Treating [name]/Extender: [name] in Treatment: 1 Encounter Discharge Information Items Post Procedure Vitals Discharge Condition: Stable Temperature (F): 98.0 Ambulatory Status: Cane Pulse (bpm): 60 Discharge Destination: Home Respiratory Rate (breaths/min): 18 Transportation: Private Auto Blood Pressure (mmHg): 159/91 Accompanied By: husband Schedule Follow-up Appointment: Yes Clinical Summary of Care: Electronic Signature(s) Signed: 10/16/2022 4:43:27 PM By: [name] Ard RN Entered By: [name] on 10/13/2022 16:18:53 -------------------------------------------------------------------------------- Lower Extremity Assessment Details Patient Name: Date of Service: [name], [name] 10/13/2022 1:00 PM Medical Record Number: [id number] Patient Account Number: [id number] Date of Birth/Sex: Treating RN: [birth year] (76 y.o. [name] Primary Care Silvia Markuson: [name] Other Clinician: Referring Lorali Khamis: Treating [name]/Extender: [name] Weeks in Treatment: 1 Edema Assessment Assessed: [Left: No] [Right: No] [Left: Edema] [Right: :] Calf Left: Right: Point of Measurement: From Medial Instep 39 cm Ankle Left:  Right: Point of Measurement: From Medial Instep 21.5 cm Vascular Assessment Pulses: Dorsalis Pedis Palpable: [Right:Yes] Electronic Signature(s) Signed: 10/16/2022 4:43:27 PM By: [name] Ard RN Entered By: [name] on 10/13/2022 13:40:20 Multi Wound Chart Details -------------------------------------------------------------------------------- [name] ([id number]) [id number] Page 3 of 7 Patient Name: Date of Service: [name], [name] 10/13/2022 1:00 PM Medical Record Number: [id number] Patient Account Number: [id number] Date of Birth/Sex: Treating RN: October 31, 1946 (76 y.o. F) Primary Care Caidin Heidenreich: [name] Other Clinician: Referring Bartley Vuolo: Treating [name]/Extender: [name] in Treatment: 1 Vital Signs Height(in): 65 Pulse(bpm): 60 Weight(lbs): 229 Blood Pressure(mmHg): 159/91 Body Mass Index(BMI): 38.1 Temperature(F): 98.0  Respiratory Rate(breaths/min): 18 Wound Assessments Wound Number: 6 N/A N/A Photos: N/A N/A Right, Lateral Ankle N/A N/A Wound Location: Gradually Appeared N/A N/A Wounding Event: T be determined o N/A N/A Primary Etiology: Cataracts, Anemia, Lymphedema, N/A N/A Comorbid History: Congestive Heart Failure, Hypertension, Peripheral Venous Disease, Osteoarthritis, Neuropathy, Confinement Anxiety 09/25/2022 N/A N/A Date Acquired: 1 N/A N/A Weeks of Treatment: Open N/A N/A Wound Status: No N/A N/A Wound Recurrence: 1.2x0.8x0.1 N/A N/A Measurements L x W x D (cm) 0.754 N/A N/A A (cm) : rea 0.075 N/A N/A Volume (cm) : 27.30% N/A N/A % Reduction in A rea: 27.90% N/A N/A % Reduction in Volume: Full Thickness Without Exposed N/A N/A Classification: Support Structures Medium N/A N/A Exudate A mount: Serous N/A N/A Exudate Type: amber N/A N/A Exudate Color: Flat and Intact N/A N/A Wound Margin: Medium (34-66%) N/A N/A Granulation A mount: Pink  N/A N/A Granulation Quality: Medium (34-66%) N/A N/A Necrotic A mount: Small (1-33%) N/A N/A Epithelialization: Debridement - Selective/Open Wound N/A N/A Debridement: Pre-procedure Verification/Time Out 13:48 N/A N/A Taken: Lidocaine 5% topical ointment N/A N/A Pain Control: Slough N/A N/A Tissue Debrided: Non-Viable Tissue N/A N/A Level: 0.96 N/A N/A Debridement A (sq cm): rea Curette N/A N/A Instrument: Minimum N/A N/A Bleeding: Pressure N/A N/A Hemostasis A chieved: Procedure was tolerated well N/A N/A Debridement Treatment Response: 1.2x0.8x0.1 N/A N/A Post Debridement Measurements L x W x D (cm) 0.075 N/A N/A Post Debridement Volume: (cm) Dry/Scaly: Yes N/A N/A Periwound Skin Moisture: No Abnormality N/A N/A Temperature: Compression Therapy N/A N/A Procedures Performed: Debridement Treatment Notes Electronic Signature(s) Signed: 10/13/2022 2:19:30 PM By: Duanne Guess MD FACS Entered By: Duanne Guess on 10/13/2022 14:19:30 [name] ([id number]) [id number] Page 4 of 7 -------------------------------------------------------------------------------- Multi-Disciplinary Care Plan Details Patient Name: Date of Service: [name], [name] 10/13/2022 1:00 PM Medical Record Number: [id number] Patient Account Number: [id number] Date of Birth/Sex: Treating RN: [birth year] (76 y.o. [name] Primary Care Vrishank Moster: [name] Other Clinician: Referring Beena Catano: Treating Kaelyn Innocent/Extender: [name] in Treatment: 1 Active Inactive Nutrition Nursing Diagnoses: Potential for alteratiion in Nutrition/Potential for imbalanced nutrition Goals: Patient/caregiver agrees to and verbalizes understanding of need to use nutritional supplements and/or vitamins as prescribed Date Initiated: 10/13/2022 Target Resolution Date: 11/24/2022 Goal Status: Active Interventions: Provide education  on nutrition Treatment Activities: Dietary management education, guidance and counseling : 10/13/2022 Notes: Wound/Skin Impairment Nursing Diagnoses: Impaired tissue integrity Goals: Ulcer/skin breakdown will have a volume reduction of 30% by week 4 Date Initiated: 10/13/2022 Target Resolution Date: 11/24/2022 Goal Status: Active Interventions: Provide education on ulcer and skin care Notes: Electronic Signature(s) Signed: 10/16/2022 4:43:27 PM By: [name] Ard RN Entered By: [name] on 10/13/2022 13:54:44 -------------------------------------------------------------------------------- Pain Assessment Details Patient Name: Date of Service: [name], [name] 10/13/2022 1:00 PM Medical Record Number: [id number] Patient Account Number: [id number] Date of Birth/Sex: Treating RN: December 22, 1945 (76 y.o. [name] Primary Care Marney Treloar: [name] Other Clinician: Referring Wash Nienhaus: Treating Shaniece Bussa/Extender: [name] Weeks in Treatment: 1 Active Problems Location of Pain Severity and Description of Pain JENICKA, COXE ([id number]) [id number] Page 5 of 7 Patient Has Paino No Site Locations Pain Management and Medication Current Pain Management: Electronic Signature(s) Signed: 10/16/2022 4:43:27 PM By: [name] Ard RN Entered By: [name] on 10/13/2022 13:39:57 -------------------------------------------------------------------------------- Patient/Caregiver Education Details Patient Name: Date of Service: [name], [name] 11/17/2023andnbsp1:00 PM Medical Record Number: [id number] Patient Account Number: [id number] Date of Birth/Gender: Treating RN: 05-Aug-1946 (76 y.o.  [name] Primary Care Physician: [name] Other Clinician: Referring Physician: Treating Physician/Extender: [name] in Treatment: 1 Education Assessment Education Provided  To: Patient Education Topics Provided Nutrition: Methods: Explain/Verbal Responses: Reinforcements needed, State content correctly Wound/Skin Impairment: Methods: Explain/Verbal Responses: Reinforcements needed, State content correctly Electronic Signature(s) Signed: 10/16/2022 4:43:27 PM By: [name] Ard RN Entered By: [name] on 10/13/2022 13:55:05 [name] ([id number]) [id number] Page 6 of 7 -------------------------------------------------------------------------------- Wound Assessment Details Patient Name: Date of Service: [name], [name] 10/13/2022 1:00 PM Medical Record Number: [id number] Patient Account Number: [id number] Date of Birth/Sex: Treating RN: 06/02/1946 (76 y.o. [name], [name] Primary Care Roye Gustafson: [name] Other Clinician: Referring Aivah Putman: Treating Lora Chavers/Extender: [name] Weeks in Treatment: 1 Wound Status Wound Number: 6 Primary T be determined o Etiology: Wound Location: Right, Lateral Ankle Wound Open Wounding Event: Gradually Appeared Status: Date Acquired: 09/25/2022 Comorbid Cataracts, Anemia, Lymphedema, Congestive Heart Failure, Weeks Of Treatment: 1 History: Hypertension, Peripheral Venous Disease, Osteoarthritis, Clustered Wound: No Neuropathy, Confinement Anxiety Photos Wound Measurements Length: (cm) 1.2 Width: (cm) 0.8 Depth: (cm) 0.1 Area: (cm) 0.754 Volume: (cm) 0.075 % Reduction in Area: 27.3% % Reduction in Volume: 27.9% Epithelialization: Small (1-33%) Tunneling: No Undermining: No Wound Description Classification: Full Thickness Without Exposed Support Structures Wound Margin: Flat and Intact Exudate Amount: Medium Exudate Type: Serous Exudate Color: amber Foul Odor After Cleansing: No Slough/Fibrino Yes Wound Bed Granulation Amount: Medium (34-66%) Granulation Quality: Pink Necrotic Amount: Medium (34-66%) Necrotic Quality:  Adherent Slough Periwound Skin Texture Texture Color No Abnormalities Noted: No No Abnormalities Noted: No Moisture Temperature / Pain No Abnormalities Noted: No Temperature: No Abnormality Dry / Scaly: Yes Treatment Notes Wound #6 (Ankle) Wound Laterality: Right, Lateral Cleanser Soap and Water Discharge Instruction: May shower and wash wound with dial antibacterial soap and water prior to dressing change. Peri-Wound Care Sween Lotion (Moisturizing lotion) Discharge Instruction: Apply moisturizing lotion as directed Topical [name], [name] ([id number]) [id number].pdf Page 7 of 7 Primary Dressing Promogran Prisma Matrix, 4.34 (sq in) (silver collagen) Discharge Instruction: Moisten collagen with saline or hydrogel Secondary Dressing ABD Pad, 8x10 Discharge Instruction: Apply over primary dressing as directed. Secured With Transpore Surgical Tape, 2x10 (in/yd) Discharge Instruction: Secure dressing with tape as directed. Compression Wrap ThreePress (3 layer compression wrap) Discharge Instruction: Apply three layer compression as directed. Compression Stockings Add-Ons Electronic Signature(s) Signed: 10/16/2022 4:43:27 PM By: [name] Ard RN Entered By: [name] on 10/13/2022 13:43:09 -------------------------------------------------------------------------------- Vitals Details Patient Name: Date of Service: [name], [name] 10/13/2022 1:00 PM Medical Record Number: [id number] Patient Account Number: [id number] Date of Birth/Sex: Treating RN: [birth year] (76 y.o. [name], [name] Primary Care Cataleya Cristina: [name] Other Clinician: Referring Shirlene Andaya: Treating Cenia Zaragosa/Extender: [name] Weeks in Treatment: 1 Vital Signs Time Taken: 13:39 Temperature (F): 98.0 Height (in): 65 Pulse (bpm): 60 Weight (lbs): 229 Respiratory Rate (breaths/min): 18 Body Mass Index (BMI): 38.1 Blood Pressure (mmHg):  159/91 Reference Range: 80 - 120 mg / dl Electronic Signature(s) Signed: 10/16/2022 4:43:27 PM By: [name] Ard RN Entered By: [name] on 10/13/2022 13:39:51

## 2022-10-17 ENCOUNTER — Encounter (HOSPITAL_BASED_OUTPATIENT_CLINIC_OR_DEPARTMENT_OTHER): Payer: PPO | Admitting: General Surgery

## 2022-10-17 DIAGNOSIS — L97312 Non-pressure chronic ulcer of right ankle with fat layer exposed: Secondary | ICD-10-CM | POA: Diagnosis not present

## 2022-10-17 NOTE — Progress Notes (Signed)
ALLESHA, ARONOFF ([id number]) [id number] Page 1 of 1 Visit Report for 10/17/2022 SuperBill Details Patient Name: Date of Service: [name], [name] 10/17/2022 Medical Record Number: [id number] Patient Account Number: [id number] Date of Birth/Sex: Treating RN: 1946-06-17 (76 y.o. Orville Govern Primary Care Provider: Mayer Masker Other Clinician: Referring Provider: Treating Provider/Extender: Fernanda Drum Weeks in Treatment: 1 Diagnosis Coding ICD-10 Codes Code Description [phone] Non-pressure chronic ulcer of right ankle with fat layer exposed I87.2 Venous insufficiency (chronic) (peripheral) I50.32 Chronic diastolic (congestive) heart failure E66.09 Other obesity due to excess calories Facility Procedures CPT4 Code Description Modifier Quantity [id number] (Facility Use Only) [phone] - APPLY MULTLAY COMPRS LWR RT LEG 1 Electronic Signature(s) Signed: 10/17/2022 3:41:18 PM By: Duanne Guess MD FACS Signed: 10/17/2022 3:44:28 PM By: Redmond Pulling RN, BSN Entered By: Redmond Pulling on 10/17/2022 13:08:54

## 2022-10-17 NOTE — Progress Notes (Signed)
ANDRIA, HEAD ([id number]) [id number] Page 1 of 4 Visit Report for 10/17/2022 Arrival Information Details Patient Name: Date of Service: [name], [name] 10/17/2022 11:30 A M Medical Record Number: [id number] Patient Account Number: [id number] Date of Birth/Sex: Treating RN: Aug 07, 1946 (76 y.o. Dola Argyle, Lyla Son Primary Care Jovann Luse: Mayer Masker Other Clinician: Referring Jais Demir: Treating Iriel Nason/Extender: Marjean Donna in Treatment: 1 Visit Information History Since Last Visit Added or deleted any medications: No Patient Arrived: Gilmer Mor Any new allergies or adverse reactions: No Arrival Time: 13:04 Had a fall or experienced change in No Accompanied By: husband activities of daily living that may affect Transfer Assistance: None risk of falls: Patient Identification Verified: Yes Signs or symptoms of abuse/neglect since last visito No Secondary Verification Process Completed: Yes Hospitalized since last visit: No Patient Requires Transmission-Based Precautions: No Implantable device outside of the clinic excluding No Patient Has Alerts: No cellular tissue based products placed in the center since last visit: Has Dressing in Place as Prescribed: Yes Has Compression in Place as Prescribed: Yes Pain Present Now: Yes Electronic Signature(s) Signed: 10/17/2022 3:44:28 PM By: Redmond Pulling RN, BSN Entered By: Redmond Pulling on 10/17/2022 13:05:38 -------------------------------------------------------------------------------- Compression Therapy Details Patient Name: Date of Service: [name], Stark Bray H. 10/17/2022 11:30 A M Medical Record Number: [id number] Patient Account Number: [id number] Date of Birth/Sex: Treating RN: 05/29/1946 (76 y.o. Orville Govern Primary Care Taimi Towe: Mayer Masker Other Clinician: Referring Jaylise Peek: Treating Atharva Mirsky/Extender: Fernanda Drum Weeks in  Treatment: 1 Compression Therapy Performed for Wound Assessment: Wound #6 Right,Lateral Ankle Performed By: Clinician Redmond Pulling, RN Compression Type: Three Layer Electronic Signature(s) Signed: 10/17/2022 3:44:28 PM By: Redmond Pulling RN, BSN Entered By: Redmond Pulling on 10/17/2022 13:07:53 -------------------------------------------------------------------------------- Encounter Discharge Information Details Patient Name: Date of Service: [name], Haizlee H. 10/17/2022 11:30 A M Medical Record Number: [id number] Patient Account Number: [id number] [name], [name] ([id number]) [id number] Page 2 of 4 Date of Birth/Sex: Treating RN: 1946/07/01 (76 y.o. Orville Govern Primary Care Jaquil Todt: Mayer Masker Other Clinician: Referring Daily Crate: Treating Paul Trettin/Extender: Marjean Donna in Treatment: 1 Encounter Discharge Information Items Discharge Condition: Stable Ambulatory Status: Cane Discharge Destination: Home Transportation: Private Auto Accompanied By: husband Schedule Follow-up Appointment: Yes Clinical Summary of Care: Patient Declined Electronic Signature(s) Signed: 10/17/2022 3:44:28 PM By: Redmond Pulling RN, BSN Entered By: Redmond Pulling on 10/17/2022 13:08:37 -------------------------------------------------------------------------------- Patient/Caregiver Education Details Patient Name: Date of Service: [name], [name] 11/21/2023andnbsp11:30 A M Medical Record Number: [id number] Patient Account Number: [id number] Date of Birth/Gender: Treating RN: February 27, 1946 (76 y.o. Orville Govern Primary Care Physician: Mayer Masker Other Clinician: Referring Physician: Treating Physician/Extender: Marjean Donna in Treatment: 1 Education Assessment Education Provided To: Patient Education Topics Provided Wound/Skin Impairment: Methods: Explain/Verbal Responses: State content  correctly Electronic Signature(s) Signed: 10/17/2022 3:44:28 PM By: Redmond Pulling RN, BSN Entered By: Redmond Pulling on 10/17/2022 13:08:15 -------------------------------------------------------------------------------- Wound Assessment Details Patient Name: Date of Service: [name], Stark Bray H. 10/17/2022 11:30 A M Medical Record Number: [id number] Patient Account Number: [id number] Date of Birth/Sex: Treating RN: [birth year] (76 y.o. Orville Govern Primary Care Lamaya Hyneman: Mayer Masker Other Clinician: Referring Shawntel Farnworth: Treating Gurveer Colucci/Extender: Fernanda Drum Weeks in Treatment: 1 Wound Status Wound Number: 6 Primary Venous Leg Ulcer Etiology: Wound Location: Right, Lateral Ankle Wound Open Wounding Event: Gradually Appeared Status: Date Acquired: 09/25/2022 SHELETHA, BOW ([id number]) [id number] Page 3 of 4 Date Acquired: 09/25/2022 Comorbid Cataracts, Anemia, Lymphedema, Congestive Heart Failure, Weeks Of  Treatment: 1 History: Hypertension, Peripheral Venous Disease, Osteoarthritis, Clustered Wound: No Neuropathy, Confinement Anxiety Wound Measurements Length: (cm) 1.2 Width: (cm) 0.8 Depth: (cm) 0.1 Area: (cm) 0.754 Volume: (cm) 0.075 % Reduction in Area: 27.3% % Reduction in Volume: 27.9% Epithelialization: Small (1-33%) Tunneling: No Undermining: No Wound Description Classification: Full Thickness Without Exposed Support Structures Wound Margin: Flat and Intact Exudate Amount: Medium Exudate Type: Serous Exudate Color: amber Foul Odor After Cleansing: No Slough/Fibrino Yes Wound Bed Granulation Amount: Medium (34-66%) Granulation Quality: Pink Necrotic Amount: Medium (34-66%) Necrotic Quality: Adherent Slough Periwound Skin Texture Texture Color No Abnormalities Noted: No No Abnormalities Noted: No Moisture Temperature / Pain No Abnormalities Noted: No Temperature: No Abnormality Dry /  Scaly: Yes Treatment Notes Wound #6 (Ankle) Wound Laterality: Right, Lateral Cleanser Soap and Water Discharge Instruction: May shower and wash wound with dial antibacterial soap and water prior to dressing change. Peri-Wound Care Sween Lotion (Moisturizing lotion) Discharge Instruction: Apply moisturizing lotion as directed Topical Primary Dressing Promogran Prisma Matrix, 4.34 (sq in) (silver collagen) Discharge Instruction: Moisten collagen with saline or hydrogel Secondary Dressing ABD Pad, 8x10 Discharge Instruction: Apply over primary dressing as directed. Secured With Transpore Surgical Tape, 2x10 (in/yd) Discharge Instruction: Secure dressing with tape as directed. Compression Wrap ThreePress (3 layer compression wrap) Discharge Instruction: Apply three layer compression as directed. Compression Stockings Add-Ons Electronic Signature(s) Signed: 10/17/2022 3:44:28 PM By: Redmond Pulling RN, BSN Entered By: Redmond Pulling on 10/17/2022 13:07:33 [name] ([id number]) [id number] Page 4 of 4 -------------------------------------------------------------------------------- Vitals Details Patient Name: Date of Service: [name], [name] 10/17/2022 11:30 A M Medical Record Number: [id number] Patient Account Number: [id number] Date of Birth/Sex: Treating RN: [birth year] (76 y.o. Orville Govern Primary Care Dillinger Aston: Mayer Masker Other Clinician: Referring Lunabella Badgett: Treating Ezekiel Menzer/Extender: Fernanda Drum Weeks in Treatment: 1 Vital Signs Time Taken: 11:40 Temperature (F): 97.9 Height (in): 65 Pulse (bpm): 71 Weight (lbs): 229 Respiratory Rate (breaths/min): 18 Body Mass Index (BMI): 38.1 Blood Pressure (mmHg): 146/84 Reference Range: 80 - 120 mg / dl Electronic Signature(s) Signed: 10/17/2022 3:44:28 PM By: Redmond Pulling RN, BSN Entered By: Redmond Pulling on 10/17/2022 13:06:05

## 2022-10-23 ENCOUNTER — Other Ambulatory Visit: Payer: Self-pay | Admitting: Nurse Practitioner

## 2022-10-23 DIAGNOSIS — G8929 Other chronic pain: Secondary | ICD-10-CM

## 2022-10-25 ENCOUNTER — Encounter (HOSPITAL_BASED_OUTPATIENT_CLINIC_OR_DEPARTMENT_OTHER): Payer: PPO | Admitting: General Surgery

## 2022-10-25 ENCOUNTER — Other Ambulatory Visit: Payer: Self-pay | Admitting: Nurse Practitioner

## 2022-10-25 DIAGNOSIS — F10982 Alcohol use, unspecified with alcohol-induced sleep disorder: Secondary | ICD-10-CM

## 2022-10-25 DIAGNOSIS — L97312 Non-pressure chronic ulcer of right ankle with fat layer exposed: Secondary | ICD-10-CM | POA: Diagnosis not present

## 2022-10-25 DIAGNOSIS — I872 Venous insufficiency (chronic) (peripheral): Secondary | ICD-10-CM | POA: Diagnosis not present

## 2022-10-27 NOTE — Progress Notes (Signed)
[name], [name] ([id number]) [id number] Page 1 of 8 Visit Report for 10/25/2022 Arrival Information Details Patient Name: Date of Service: [name], [name]. 10/25/2022 2:30 PM Medical Record Number: [id number] Patient Account Number: [id number] Date of Birth/Sex: Treating RN: 1946-10-18 (76 y.o. [name]) Zochol, [name] Primary Care Carder Yin: [name], [name] Other Clinician: Referring Lorrane Mccay: Treating Olen Eaves/Extender: [name], Jennifer [name], [name] Weeks in Treatment: 2 Visit Information History Since Last Visit Added or deleted any medications: No Patient Arrived: Gilmer MorCane Any new allergies or adverse reactions: No Arrival Time: 14:53 Had a fall or experienced change in No Accompanied By: husband activities of daily living that may affect Transfer Assistance: None risk of falls: Patient Requires Transmission-Based Precautions: No Signs or symptoms of abuse/neglect since last visito No Patient Has Alerts: No Hospitalized since last visit: No Implantable device outside of the clinic excluding No cellular tissue based products placed in the center since last visit: Has Compression in Place as Prescribed: Yes Pain Present Now: Yes Electronic Signature(s) Signed: 10/26/2022 7:35:29 PM By: Tommie ArdZochol, [name] RN Entered By: Tommie ArdZochol, [name] on 10/25/2022 14:54:11 -------------------------------------------------------------------------------- Compression Therapy Details Patient Name: Date of Service: Kathrine HaddockMCCRA [name], [name]. 10/25/2022 2:30 PM Medical Record Number: [id number] Patient Account Number: [id number] Date of Birth/Sex: Treating RN: 1946-10-18 (76 y.o. [name]) Zochol, [name] Primary Care Jerolyn Flenniken: [name], [name] Other Clinician: Referring Michio Thier: Treating Lakin Romer/Extender: Fernanda Drumannon, Jennifer [name], [name] Weeks in Treatment: 2 Compression Therapy Performed for Wound Assessment: Wound #6 Right,Lateral Ankle Performed By: Clinician Tommie ArdZochol, [name],  RN Compression Type: Three Layer Post Procedure Diagnosis Same as Pre-procedure Electronic Signature(s) Signed: 10/26/2022 7:35:29 PM By: Tommie ArdZochol, [name] RN Entered By: Tommie ArdZochol, [name] on 10/25/2022 16:42:47 [name], Eryca [name] ([id number]) [id number] Page 2 of 8 -------------------------------------------------------------------------------- Encounter Discharge Information Details Patient Name: Date of Service: [name], [name]. 10/25/2022 2:30 PM Medical Record Number: [id number] Patient Account Number: [id number] Date of Birth/Sex: Treating RN: 1946-10-18 (76 y.o. [name]) Zochol, [name] Primary Care Eduin Friedel: [name], [name] Other Clinician: Referring Rasheem Figiel: Treating Galaxy Borden/Extender: [name], Jennifer [name], [name] Weeks in Treatment: 2 Encounter Discharge Information Items Post Procedure Vitals Discharge Condition: Stable Temperature (F): 97.8 Ambulatory Status: Cane Pulse (bpm): 80 Discharge Destination: Home Respiratory Rate (breaths/min): 18 Transportation: Private Auto Blood Pressure (mmHg): 176/83 Accompanied By: husband Schedule Follow-up Appointment: No Clinical Summary of Care: Electronic Signature(s) Signed: 10/26/2022 7:35:29 PM By: Tommie ArdZochol, [name] RN Entered By: Tommie ArdZochol, [name] on 10/25/2022 15:15:19 -------------------------------------------------------------------------------- Lower Extremity Assessment Details Patient Name: Date of Service: [name], [name]. 10/25/2022 2:30 PM Medical Record Number: [id number] Patient Account Number: [id number] Date of Birth/Sex: Treating RN: 1946-10-18 (76 y.o. [name]) Zochol, [name] Primary Care Hadar Elgersma: [name], [name] Other Clinician: Referring Calani Gick: Treating Thomas Rhude/Extender: Fernanda Drumannon, Jennifer [name], [name] Weeks in Treatment: 2 Edema Assessment Assessed: [Left: No] [Right: No] [Left: Edema] [Right: :] Calf Left: Right: Point of Measurement: From Medial Instep 33 cm Ankle Left:  Right: Point of Measurement: From Medial Instep 23.4 cm Vascular Assessment Pulses: Dorsalis Pedis Palpable: [Right:Yes] Electronic Signature(s) Signed: 10/26/2022 7:35:29 PM By: Tommie ArdZochol, [name] RN Entered By: Tommie ArdZochol, [name] on 10/25/2022 14:59:35 Multi Wound Chart Details -------------------------------------------------------------------------------- [name], [name] ([id number]) [id number] Page 3 of 8 Patient Name: Date of Service: [name], [name]. 10/25/2022 2:30 PM Medical Record Number: [id number] Patient Account Number: [id number] Date of Birth/Sex: Treating RN: 1946-10-18 (76 y.o. F) Primary Care Sampson Self: [name], [name] Other Clinician: Referring Braian Tijerina: Treating Brance Dartt/Extender: [name], Jennifer [name], [name] Weeks in Treatment: 2 Vital Signs Height(in): 65 Pulse(bpm): 80 Weight(lbs): 229 Blood Pressure(mmHg): 176/83 Body Mass Index(BMI): 38.1 Temperature(F): 97.8  Respiratory Rate(breaths/min): 18 Wound Assessments Wound Number: 6 N/A N/A Photos: N/A N/A Right, Lateral Ankle N/A N/A Wound Location: Gradually Appeared N/A N/A Wounding Event: Venous Leg Ulcer N/A N/A Primary Etiology: Cataracts, Anemia, Lymphedema, N/A N/A Comorbid History: Congestive Heart Failure, Hypertension, Peripheral Venous Disease, Osteoarthritis, Neuropathy, Confinement Anxiety 09/25/2022 N/A N/A Date Acquired: 2 N/A N/A Weeks of Treatment: Open N/A N/A Wound Status: No N/A N/A Wound Recurrence: 1x1x0.1 N/A N/A Measurements L x W x D (cm) 0.785 N/A N/A A (cm) : rea 0.079 N/A N/A Volume (cm) : 24.30% N/A N/A % Reduction in A rea: 24.00% N/A N/A % Reduction in Volume: Full Thickness Without Exposed N/A N/A Classification: Support Structures Medium N/A N/A Exudate A mount: Serous N/A N/A Exudate Type: amber N/A N/A Exudate Color: Flat and Intact N/A N/A Wound Margin: Medium (34-66%) N/A N/A Granulation A mount: Pink N/A  N/A Granulation Quality: Medium (34-66%) N/A N/A Necrotic A mount: Small (1-33%) N/A N/A Epithelialization: Debridement - Excisional N/A N/A Debridement: Pre-procedure Verification/Time Out 15:12 N/A N/A Taken: Lidocaine 5% topical ointment N/A N/A Pain Control: Subcutaneous, Slough N/A N/A Tissue Debrided: Skin/Subcutaneous Tissue N/A N/A Level: 1 N/A N/A Debridement A (sq cm): rea Curette N/A N/A Instrument: Minimum N/A N/A Bleeding: Pressure N/A N/A Hemostasis A chieved: Procedure was tolerated well N/A N/A Debridement Treatment Response: 1x1x0.1 N/A N/A Post Debridement Measurements L x W x D (cm) 0.079 N/A N/A Post Debridement Volume: (cm) Dry/Scaly: Yes N/A N/A Periwound Skin Moisture: No Abnormality N/A N/A Temperature: Debridement N/A N/A Procedures Performed: Treatment Notes Wound #6 (Ankle) Wound Laterality: Right, Lateral Cleanser Soap and Water Discharge Instruction: May shower and wash wound with dial antibacterial soap and water prior to dressing change. Peri-Wound Care Elberta, Naryah [name] ([id number]) [id number] Page 4 of 8 Sween Lotion (Moisturizing lotion) Discharge Instruction: Apply moisturizing lotion as directed Topical Primary Dressing Promogran Prisma Matrix, 4.34 (sq in) (silver collagen) Discharge Instruction: Moisten collagen with saline or hydrogel Secondary Dressing ABD Pad, 8x10 Discharge Instruction: Apply over primary dressing as directed. Secured With Transpore Surgical Tape, 2x10 (in/yd) Discharge Instruction: Secure dressing with tape as directed. Compression Wrap ThreePress (3 layer compression wrap) Discharge Instruction: Apply three layer compression as directed. Compression Stockings Add-Ons Electronic Signature(s) Signed: 10/25/2022 4:17:05 PM By: Duanne Guess MD FACS Entered By: Duanne Guess on 10/25/2022  16:17:04 -------------------------------------------------------------------------------- Multi-Disciplinary Care Plan Details Patient Name: Date of Service: [name], [place] [name]. 10/25/2022 2:30 PM Medical Record Number: [id number] Patient Account Number: [id number] Date of Birth/Sex: Treating RN: September 05, 1946 (76 y.o. [name] Mc Primary Care Kyrielle Urbanski: Mayer Masker Other Clinician: Referring Carolos Fecher: Treating Sakeenah Valcarcel/Extender: [name] Donna in Treatment: 2 Active Inactive Nutrition Nursing Diagnoses: Potential for alteratiion in Nutrition/Potential for imbalanced nutrition Goals: Patient/caregiver agrees to and verbalizes understanding of need to use nutritional supplements and/or vitamins as prescribed Date Initiated: 10/13/2022 Target Resolution Date: 11/24/2022 Goal Status: Active Interventions: Provide education on nutrition Treatment Activities: Dietary management education, guidance and counseling : 10/13/2022 Education provided on Nutrition : 10/13/2022 Notes: Wound/Skin Impairment Nursing Diagnoses: Impaired tissue integrity GoalsROYA, [name] ([id number]) [id number] Page 5 of 8 Ulcer/skin breakdown will have a volume reduction of 30% by week 4 Date Initiated: 10/13/2022 Target Resolution Date: 11/24/2022 Goal Status: Active Interventions: Provide education on ulcer and skin care Notes: Electronic Signature(s) Signed: 10/26/2022 7:35:29 PM By: Tommie Ard RN Entered By: Tommie Ard on 10/25/2022 15:02:00 -------------------------------------------------------------------------------- Pain Assessment Details Patient Name: Date of Service: [name], [name]. 10/25/2022 2:30 PM  Medical Record Number: [id number] Patient Account Number: [id number] Date of Birth/Sex: Treating RN: 12-11-1945 (76 y.o. [name] Mc Primary Care Cara Thaxton: Mayer Masker Other Clinician: Referring  Tyeler Goedken: Treating Caroleen Stoermer/Extender: Fernanda Drum Weeks in Treatment: 2 Active Problems Location of Pain Severity and Description of Pain Patient Has Paino No Site Locations Rate the pain. Current Pain Level: 0 Pain Management and Medication Current Pain Management: Electronic Signature(s) Signed: 10/26/2022 7:35:29 PM By: Tommie Ard RN Entered By: Tommie Ard on 10/25/2022 14:58:47 -------------------------------------------------------------------------------- Patient/Caregiver Education Details Patient Name: Date of Service: [name], [name] 11/29/2023andnbsp2:30 PM Medical Record Number: [id number] Patient Account Number: [id number] Date of Birth/Gender: Treating RN: 1946-04-06 (76 y.o. [name] Mc Primary Care Physician: Mayer Masker Other Clinician: MARILENE, [name] ([id number]) [id number] Page 6 of 8 Referring Physician: Treating Physician/Extender: [name] Donna in Treatment: 2 Education Assessment Education Provided To: Patient Education Topics Provided Nutrition: Methods: Explain/Verbal Responses: Reinforcements needed, State content correctly Wound/Skin Impairment: Methods: Explain/Verbal Responses: Reinforcements needed, State content correctly Electronic Signature(s) Signed: 10/26/2022 7:35:29 PM By: Tommie Ard RN Entered By: Tommie Ard on 10/25/2022 15:07:01 -------------------------------------------------------------------------------- Wound Assessment Details Patient Name: Date of Service: [name], [name] 10/25/2022 2:30 PM Medical Record Number: [id number] Patient Account Number: [id number] Date of Birth/Sex: Treating RN: Jan 04, 1946 (76 y.o. [name] Nova, [name] Primary Care Kailie Polus: Mayer Masker Other Clinician: Referring Jerimy Johanson: Treating Margarito Dehaas/Extender: Fernanda Drum Weeks in Treatment: 2 Wound Status Wound Number: 6  Primary Venous Leg Ulcer Etiology: Wound Location: Right, Lateral Ankle Wound Open Wounding Event: Gradually Appeared Status: Date Acquired: 09/25/2022 Comorbid Cataracts, Anemia, Lymphedema, Congestive Heart Failure, Weeks Of Treatment: 2 History: Hypertension, Peripheral Venous Disease, Osteoarthritis, Clustered Wound: No Neuropathy, Confinement Anxiety Photos Wound Measurements Length: (cm) 1 Width: (cm) 1 Depth: (cm) 0.1 Area: (cm) 0.785 Volume: (cm) 0.079 % Reduction in Area: 24.3% % Reduction in Volume: 24% Epithelialization: Small (1-33%) Tunneling: No Undermining: No Wound Description Classification: Full Thickness Without Exposed Support Structures Wound Margin: Flat and Intact Vanwagner, [name] ([id number]) Exudate Amount: Medium Exudate Type: Serous Exudate Color: amber Foul Odor After Cleansing: No Slough/Fibrino Yes [id number] Page 7 of 8 Wound Bed Granulation Amount: Medium (34-66%) Granulation Quality: Pink Necrotic Amount: Medium (34-66%) Necrotic Quality: Adherent Slough Periwound Skin Texture Texture Color No Abnormalities Noted: No No Abnormalities Noted: No Moisture Temperature / Pain No Abnormalities Noted: No Temperature: No Abnormality Dry / Scaly: Yes Treatment Notes Wound #6 (Ankle) Wound Laterality: Right, Lateral Cleanser Soap and Water Discharge Instruction: May shower and wash wound with dial antibacterial soap and water prior to dressing change. Peri-Wound Care Sween Lotion (Moisturizing lotion) Discharge Instruction: Apply moisturizing lotion as directed Topical Primary Dressing Promogran Prisma Matrix, 4.34 (sq in) (silver collagen) Discharge Instruction: Moisten collagen with saline or hydrogel Secondary Dressing ABD Pad, 8x10 Discharge Instruction: Apply over primary dressing as directed. Secured With Transpore Surgical Tape, 2x10 (in/yd) Discharge Instruction: Secure dressing with tape as  directed. Compression Wrap ThreePress (3 layer compression wrap) Discharge Instruction: Apply three layer compression as directed. Compression Stockings Add-Ons Electronic Signature(s) Signed: 10/26/2022 7:35:29 PM By: Tommie Ard RN Entered By: Tommie Ard on 10/25/2022 15:05:45 -------------------------------------------------------------------------------- Vitals Details Patient Name: Date of Service: [name], [name]. 10/25/2022 2:30 PM Medical Record Number: [id number] Patient Account Number: [id number] Date of Birth/Sex: Treating RN: 30-Nov-1945 (76 y.o. [name] Mc Primary Care Adeleigh Barletta: Mayer Masker Other Clinician: Referring Keilon Ressel: Treating Alysah Carton/Extender: Fernanda Drum Weeks in Treatment: 2 Vital Signs [name], [name] ([id number])  [id number] Page 8 of 8 Time Taken: 14:53 Temperature (F): 97.8 Height (in): 65 Pulse (bpm): 80 Weight (lbs): 229 Respiratory Rate (breaths/min): 18 Body Mass Index (BMI): 38.1 Blood Pressure (mmHg): 176/83 Reference Range: 80 - 120 mg / dl Electronic Signature(s) Signed: 10/26/2022 7:35:29 PM By: Tommie Ard RN Entered By: Tommie Ard on 10/25/2022 14:58:31

## 2022-10-27 NOTE — Progress Notes (Signed)
KARLIAH, SVITAK ([id number]) [id number] Page 1 of 10 Visit Report for 10/25/2022 Chief Complaint Document Details Patient Name: Date of Service: [name], [name] 10/25/2022 2:30 PM Medical Record Number: [id number] Patient Account Number: [id number] Date of Birth/Sex: Treating RN: March 26, 1946 (76 y.o. F) Primary Care Provider: Lorrene [name] Other Clinician: Referring Provider: Treating Provider/Extender: [name] in Treatment: 2 Information Obtained from: Patient Chief Complaint Bilateral LE Ulcers 10/05/2022: RLE ulcer Electronic Signature(s) Signed: 10/25/2022 4:17:10 PM By: Fredirick Maudlin MD FACS Entered By: Fredirick Maudlin on 10/25/2022 16:17:10 -------------------------------------------------------------------------------- Debridement Details Patient Name: Date of Service: [name], [name]. 10/25/2022 2:30 PM Medical Record Number: [id number] Patient Account Number: [id number] Date of Birth/Sex: Treating RN: [birth year] (76 y.o. [name], [name] Primary Care Provider: Lorrene [name] Other Clinician: Referring Provider: Treating Provider/Extender: [name] Weeks in Treatment: 2 Debridement Performed for Assessment: Wound #6 Right,Lateral Ankle Performed By: Physician Fredirick Maudlin, MD Debridement Type: Debridement Severity of Tissue Pre Debridement: Fat layer exposed Level of Consciousness (Pre-procedure): Awake and Alert Pre-procedure Verification/Time Out Yes - 15:12 Taken: Start Time: 15:13 Pain Control: Lidocaine 5% topical ointment T Area Debrided (L x W): otal 1 (cm) x 1 (cm) = 1 (cm) Tissue and other material debrided: Viable, Non-Viable, Slough, Subcutaneous, Slough Level: Skin/Subcutaneous Tissue Debridement Description: Excisional Instrument: Curette Bleeding: Minimum Hemostasis Achieved: Pressure Response to Treatment: Procedure was tolerated well Level of  Consciousness (Post- Awake and Alert procedure): Post Debridement Measurements of Total Wound Length: (cm) 1 Width: (cm) 1 Depth: (cm) 0.1 Volume: (cm) 0.079 Character of Wound/Ulcer Post Debridement: Requires Further Debridement Severity of Tissue Post Debridement: Fat layer exposed [name] ([id number]) [id number] Page 2 of 10 Post Procedure Diagnosis Same as Pre-procedure Notes Scribed for Dr [name] by [name] East, RN Electronic Signature(s) Signed: 10/25/2022 4:27:34 PM By: Fredirick Maudlin MD FACS Signed: 10/26/2022 7:35:29 PM By: [name] East RN Entered By: [name] on 10/25/2022 15:14:24 -------------------------------------------------------------------------------- HPI Details Patient Name: Date of Service: [name], [name]. 10/25/2022 2:30 PM Medical Record Number: [id number] Patient Account Number: [id number] Date of Birth/Sex: Treating RN: May 06, 1946 (75 y.o. F) Primary Care Provider: Lorrene [name] Other Clinician: Referring Provider: Treating Provider/Extender: [name] in Treatment: 2 History of Present Illness HPI Description: 07/09/18 on evaluation today patient presents for bilateral lower extremity ulcers. She has a past medical history significant for chronic venous stasis, hypertension, and lower extremity edema with associated skin changes. Unfortunately she tells me that roughly 3 weeks ago she noted ulcers on the left lower extremity which started given her trouble. Subsequently she ended up proceeding with applying mupirocin ointment and was keeping this open air for the most part. She states that it would scab over somewhat and then reopened. The wounds have been training quite significantly. Fortunately there does not appear to be any evidence of overt infection there is some erythematous type skin changes but I believe this is likely stasis dermatitis nothing more. Fortunately  she has no evidence of any systemic infection. No fevers, chills, nausea, or vomiting noted at this time. She has been recommended before to wear compression stockings but she does not wear them on a regular basis. She has never been suggested to get Juxta-Lite compression wraps she was not aware of what these were. Patient had venous studies performed on 11/09/17 which showed no lower surety DVT or deep vein insufficiency noted. She did not have evaluation however of the superficial veins. There was full compressibility noted  throughout. Her arterial study which was performed on 11/18/17 actually showed that she has an ABI on the right of 1.17 with a TBI of 1.59 along with an ABI of 1.12 on the left and a TBI of 0.99. 07/17/18 on evaluation today patient appears to be doing much better in regard to her left lower Trinity ulcers. She's been tolerating the dressing changes without complication. There does not appear to be any evidence of infection which is good news. Overall I'm very pleased with the progress at this time. 07/24/18 on evaluation today patient's wound actually appears to be completely healed at this time. Fortunately there does not appear to be any evidence of infection which is good news. Overall I'm very happy with the progress that has been made. She did get her Velcro compression wraps. READMISSION 12/31/2020 This is a patient we had in this clinic in 2019 felt to have chronic venous insufficiency wounds on her lower extremities. She was discharged with bilateral compression stockings. She tells [place] that she never really wore them or at best wore them sparingly. She is back in clinic today having a ulcer on her left lateral lower extremity since October and one on the right anterior for the last 2 weeks. I do not know that she is doing any primary dressing on these certainly not wearing any form of compression. She is already been evaluated by vein and vascular on 11/15/2020. They noted  that she did have ulcers on the left leg in October that healed with a need Unna boot. She had a reflux study on 12/20 that did not show evidence of a DVT or venous reflux bilaterally. They was not felt that she had arterial insufficiency based on the fact that she had brisk biphasic Doppler signals in the bilateral DP PT. The patient comes in today with superficial wounds on the right anterior mid tibia and the left lateral calf. These are small appear to have healthy surfaces. Past medical history includes cervical and upper thoracic spine surgery on 7/21, hernia repair early in January, hypertension, hyperlipidemia, hypothyroidism, gastric bypass surgery, DVT in the right peroneal artery and left popliteal and peroneal veins. Next problem CHF. She does not have diabetes ABIs in our clinic were 1.1 on the right and 1.2 on the lower 2/11; the patient's wound on the left lateral and right anterior lower leg are closed for edema control is good. She has an assortment of old juxta lite stockings with old liners. She does not like to wear anything in particular under the compression part of the juxta lite stocking. I think she is going to do exactly what she wants. READMISSION 10/05/2022 This is a 76 year old woman last seen in our clinic about 18 months ago. She has a history of venous stasis and stasis dermatitis. A reflux study done in 2021 did not show any evidence of venous reflux. She says about 3 weeks ago, she noticed an ulcer on her right lateral malleolus. She was applying triple antibiotic ointment to it for a while and then was soaking it in Epsom salts. She reports that she usually wears compression stockings, but that they were rubbing so she did not wear 1 on her right leg since the wound appeared, she has a stocking with the toe and heel cut out applied to her left leg. On examination, she has changes of chronic stasis dermatitis on the right. There is a shallow ulcer overlying the  lateral malleolus. The fat layer is exposed. It is  a little bit desiccated but fairly clean with just some light eschar and slough present. No malodor or purulent drainage. 10/13/2022: The wound is a little smaller today. Although the moisture balance is better than last week, it is still a bit dry. There is still some slough accumulation on the surface. MICHILLE, EAGLES ([id number]) [id number] Page 3 of 10 10/25/2022: The wound continues to contract. There is still thick layer of slough on the surface. The moisture balance of the wound is better, but the periwound skin is slightly macerated. Electronic Signature(s) Signed: 10/25/2022 4:17:45 PM By: Fredirick Maudlin MD FACS Entered By: Fredirick Maudlin on 10/25/2022 16:17:45 -------------------------------------------------------------------------------- Physical Exam Details Patient Name: Date of Service: [name], [name] Balo [name]. 10/25/2022 2:30 PM Medical Record Number: [id number] Patient Account Number: [id number] Date of Birth/Sex: Treating RN: 09-23-1946 (76 y.o. F) Primary Care Provider: Lorrene [name] Other Clinician: Referring Provider: Treating Provider/Extender: [name] in Treatment: 2 Constitutional She is hypertensive, but asymptomatic.. . . . No acute distress. Respiratory Normal work of breathing on room air. Notes 10/25/2022: The wound continues to contract. There is still thick layer of slough on the surface. The moisture balance of the wound is better, but the periwound skin is slightly macerated. Electronic Signature(s) Signed: 10/25/2022 4:18:09 PM By: Fredirick Maudlin MD FACS Entered By: Fredirick Maudlin on 10/25/2022 16:18:09 -------------------------------------------------------------------------------- Physician Orders Details Patient Name: Date of Service: [name], Jerome. 10/25/2022 2:30 PM Medical Record Number: [id number] Patient Account  Number: [id number] Date of Birth/Sex: Treating RN: 28-Feb-1946 (76 y.o. [name], [name] Primary Care Provider: Lorrene [name] Other Clinician: Referring Provider: Treating Provider/Extender: [name] in Treatment: 2 Verbal / Phone Orders: No Diagnosis Coding ICD-10 Coding Code Description [id number] Non-pressure chronic ulcer of right ankle with fat layer exposed I87.2 Venous insufficiency (chronic) (peripheral) 99991111 Chronic diastolic (congestive) heart failure E66.09 Other obesity due to excess calories Follow-up Appointments ppointment in 2 weeks. - Dr. Celine [name] Return A Anesthetic (In clinic) Topical Lidocaine 5% applied to wound bed - in clinic, prior to debridement [facility], [place] ([id number]) [id number] Page 4 of 10 May shower with protection but do not get wound dressing(s) wet. - Keep dressing dry at all times until next appointment. Can purchase cast protector from [facility] or [facility] Edema Control - Lymphedema / SCD / Other Bilateral Lower Extremities Elevate legs to the level of the heart or above for 30 minutes daily and/or when sitting, a frequency of: Avoid standing for long periods of time. Patient to wear own compression stockings every day. Moisturize legs daily. Wound Treatment Wound #6 - Ankle Wound Laterality: Right, Lateral Cleanser: Soap and Water Discharge Instructions: May shower and wash wound with dial antibacterial soap and water prior to dressing change. Peri-Wound Care: Sween Lotion (Moisturizing lotion) Discharge Instructions: Apply moisturizing lotion as directed Prim Dressing: Promogran Prisma Matrix, 4.34 (sq in) (silver collagen) ary Discharge Instructions: Moisten collagen with saline or hydrogel Secondary Dressing: ABD Pad, 8x10 Discharge Instructions: Apply over primary dressing as directed. Secured With: Transpore Surgical Tape, 2x10 (in/yd) Discharge  Instructions: Secure dressing with tape as directed. Compression Wrap: ThreePress (3 layer compression wrap) Discharge Instructions: Apply three layer compression as directed. Electronic Signature(s) Signed: 10/25/2022 4:27:34 PM By: Fredirick Maudlin MD FACS Entered By: Fredirick Maudlin on 10/25/2022 16:18:26 -------------------------------------------------------------------------------- Problem List Details Patient Name: Date of Service: [name], [name] [place]. 10/25/2022 2:30 PM Medical Record Number: [id number] Patient Account Number: [id number] Date of Birth/Sex: Treating RN: [birth year] (76  y.o. F) Primary Care Provider: Lorrene [name] Other Clinician: Referring Provider: Treating Provider/Extender: [name] in Treatment: 2 Active Problems ICD-10 Encounter Code Description Active Date MDM Diagnosis L97.312 Non-pressure chronic ulcer of right ankle with fat layer exposed 10/05/2022 No Yes I87.2 Venous insufficiency (chronic) (peripheral) 10/05/2022 No Yes 99991111 Chronic diastolic (congestive) heart failure 10/05/2022 No Yes E66.09 Other obesity due to excess calories 10/05/2022 No Yes Inactive Problems Esh, Geroldine [name] ([id number]) [id number] Page 5 of 10 Resolved Problems Electronic Signature(s) Signed: 10/25/2022 4:16:59 PM By: Fredirick Maudlin MD FACS Entered By: Fredirick Maudlin on 10/25/2022 16:16:58 -------------------------------------------------------------------------------- Progress Note Details Patient Name: Date of Service: [name], [name]. 10/25/2022 2:30 PM Medical Record Number: [id number] Patient Account Number: [id number] Date of Birth/Sex: Treating RN: 1946/11/22 (76 y.o. F) Primary Care Provider: Lorrene [name] Other Clinician: Referring Provider: Treating Provider/Extender: [name] in Treatment: 2 Subjective Chief Complaint Information obtained from  Patient Bilateral LE Ulcers 10/05/2022: RLE ulcer History of Present Illness (HPI) 07/09/18 on evaluation today patient presents for bilateral lower extremity ulcers. She has a past medical history significant for chronic venous stasis, hypertension, and lower extremity edema with associated skin changes. Unfortunately she tells me that roughly 3 weeks ago she noted ulcers on the left lower extremity which started given her trouble. Subsequently she ended up proceeding with applying mupirocin ointment and was keeping this open air for the most part. She states that it would scab over somewhat and then reopened. The wounds have been training quite significantly. Fortunately there does not appear to be any evidence of overt infection there is some erythematous type skin changes but I believe this is likely stasis dermatitis nothing more. Fortunately she has no evidence of any systemic infection. No fevers, chills, nausea, or vomiting noted at this time. She has been recommended before to wear compression stockings but she does not wear them on a regular basis. She has never been suggested to get Juxta-Lite compression wraps she was not aware of what these were. Patient had venous studies performed on 11/09/17 which showed no lower surety DVT or deep vein insufficiency noted. She did not have evaluation however of the superficial veins. There was full compressibility noted throughout. Her arterial study which was performed on 11/18/17 actually showed that she has an ABI on the right of 1.17 with a TBI of 1.59 along with an ABI of 1.12 on the left and a TBI of 0.99. 07/17/18 on evaluation today patient appears to be doing much better in regard to her left lower Trinity ulcers. She's been tolerating the dressing changes without complication. There does not appear to be any evidence of infection which is good news. Overall I'm very pleased with the progress at this time. 07/24/18 on evaluation today patient's  wound actually appears to be completely healed at this time. Fortunately there does not appear to be any evidence of infection which is good news. Overall I'm very happy with the progress that has been made. She did get her Velcro compression wraps. READMISSION 12/31/2020 This is a patient we had in this clinic in 2019 felt to have chronic venous insufficiency wounds on her lower extremities. She was discharged with bilateral compression stockings. She tells [place] that she never really wore them or at best wore them sparingly. She is back in clinic today having a ulcer on her left lateral lower extremity since October and one on the right anterior for the last 2 weeks. I do not know  that she is doing any primary dressing on these certainly not wearing any form of compression. She is already been evaluated by vein and vascular on 11/15/2020. They noted that she did have ulcers on the left leg in October that healed with a need Unna boot. She had a reflux study on 12/20 that did not show evidence of a DVT or venous reflux bilaterally. They was not felt that she had arterial insufficiency based on the fact that she had brisk biphasic Doppler signals in the bilateral DP PT. The patient comes in today with superficial wounds on the right anterior mid tibia and the left lateral calf. These are small appear to have healthy surfaces. Past medical history includes cervical and upper thoracic spine surgery on 7/21, hernia repair early in January, hypertension, hyperlipidemia, hypothyroidism, gastric bypass surgery, DVT in the right peroneal artery and left popliteal and peroneal veins. Next problem CHF. She does not have diabetes ABIs in our clinic were 1.1 on the right and 1.2 on the lower 2/11; the patient's wound on the left lateral and right anterior lower leg are closed for edema control is good. She has an assortment of old juxta lite stockings with old liners. She does not like to wear anything in particular  under the compression part of the juxta lite stocking. I think she is going to do exactly what she wants. READMISSION 10/05/2022 This is a 76 year old woman last seen in our clinic about 18 months ago. She has a history of venous stasis and stasis dermatitis. A reflux study done in 2021 did not show any evidence of venous reflux. She says about 3 weeks ago, she noticed an ulcer on her right lateral malleolus. She was applying triple antibiotic ointment to it for a while and then was soaking it in Epsom salts. She reports that she usually wears compression stockings, but that they were rubbing so she did not wear 1 on her right leg since the wound appeared, she has a stocking with the toe and heel cut out applied to her left leg. On examination, she has changes of chronic stasis dermatitis on the right. There is a shallow ulcer overlying the lateral malleolus. The fat layer is exposed. It is a little bit desiccated but fairly clean with just some light eschar and slough present. No malodor or purulent drainage. 10/13/2022: The wound is a little smaller today. Although the moisture balance is better than last week, it is still a bit dry. There is still some slough accumulation on the surface. [name], [name] ([id number]) [id number] Page 6 of 10 10/25/2022: The wound continues to contract. There is still thick layer of slough on the surface. The moisture balance of the wound is better, but the periwound skin is slightly macerated. Patient History Information obtained from Patient. Family History Cancer - Mother,Father, Diabetes - Father, Heart Disease - Father, Hypertension - Father, Stroke - Mother, No family history of Hereditary Spherocytosis, Kidney Disease, Lung Disease, Seizures, Thyroid Problems, Tuberculosis. Social History Never smoker, Marital Status - Married, Alcohol Use - Daily - 1.5 bottles of wine daily, Drug Use - No History, Caffeine Use - Daily -  coffee. Medical History Eyes Patient has history of Cataracts - mild Denies history of Glaucoma, Optic Neuritis Ear/Nose/Mouth/Throat Denies history of Chronic sinus problems/congestion, Middle ear problems Hematologic/Lymphatic Patient has history of Anemia - iron deficiency, Lymphedema Denies history of Hemophilia, Human Immunodeficiency Virus, Sickle Cell Disease Respiratory Denies history of Aspiration, Asthma, Chronic Obstructive Pulmonary Disease (COPD), Pneumothorax,  Sleep Apnea, Tuberculosis Cardiovascular Patient has history of Congestive Heart Failure, Hypertension, Peripheral Venous Disease Denies history of Angina, Arrhythmia, Coronary Artery Disease, Deep Vein Thrombosis, Hypotension, Myocardial Infarction, Peripheral Arterial Disease, Phlebitis, Vasculitis Gastrointestinal Denies history of Cirrhosis , Colitis, Crohnoos, Hepatitis A, Hepatitis B, Hepatitis C Endocrine Denies history of Type I Diabetes, Type II Diabetes Genitourinary Denies history of End Stage Renal Disease Immunological Denies history of Lupus Erythematosus, Raynaudoos, Scleroderma Integumentary (Skin) Denies history of History of Burn Musculoskeletal Patient has history of Osteoarthritis Denies history of Gout, Rheumatoid Arthritis, Osteomyelitis Neurologic Patient has history of Neuropathy - bila Denies history of Dementia, Quadriplegia, Paraplegia, Seizure Disorder Oncologic Denies history of Received Chemotherapy, Received Radiation Psychiatric Patient has history of Confinement Anxiety Denies history of Anorexia/bulimia Hospitalization/Surgery History - right arm surgery. - gastric bypass. - hysterectomy. - tonsillectomy. - hernia repair. - cervical myelopathy with spinal cord compression. Medical A Surgical History Notes nd Constitutional Symptoms (General Health) obesity Ear/Nose/Mouth/Throat seasonal allergies Cardiovascular aortic murmur, hyperlipidemia,  hypertriglyceridemia Gastrointestinal UGI bleed, Endocrine hypothyroidism Objective Constitutional She is hypertensive, but asymptomatic.Marland Kitchen No acute distress. Vitals Time Taken: 2:53 PM, Height: 65 in, Weight: 229 lbs, BMI: 38.1, Temperature: 97.8 F, Pulse: 80 bpm, Respiratory Rate: 18 breaths/min, Blood Pressure: 176/83 mmHg. Respiratory Normal work of breathing on room air. General Notes: 10/25/2022: The wound continues to contract. There is still thick layer of slough on the surface. The moisture balance of the wound is better, but the periwound skin is slightly macerated. [name], [name] ([id number]) [id number] Page 7 of 10 Integumentary (Hair, Skin) Wound #6 status is Open. Original cause of wound was Gradually Appeared. The date acquired was: 09/25/2022. The wound has been in treatment 2 weeks. The wound is located on the Right,Lateral Ankle. The wound measures 1cm length x 1cm width x 0.1cm depth; 0.785cm^2 area and 0.079cm^3 volume. There is no tunneling or undermining noted. There is a medium amount of serous drainage noted. The wound margin is flat and intact. There is medium (34-66%) pink granulation within the wound bed. There is a medium (34-66%) amount of necrotic tissue within the wound bed including Adherent Slough. The periwound skin appearance exhibited: Dry/Scaly. Periwound temperature was noted as No Abnormality. Assessment Active Problems ICD-10 Non-pressure chronic ulcer of right ankle with fat layer exposed Venous insufficiency (chronic) (peripheral) Chronic diastolic (congestive) heart failure Other obesity due to excess calories Procedures Wound #6 Pre-procedure diagnosis of Wound #6 is a Venous Leg Ulcer located on the Right,Lateral Ankle .Severity of Tissue Pre Debridement is: Fat layer exposed. There was a Excisional Skin/Subcutaneous Tissue Debridement with a total area of 1 sq cm performed by Fredirick Maudlin, MD. With the  following instrument(s): Curette to remove Viable and Non-Viable tissue/material. Material removed includes Subcutaneous Tissue and Slough and after achieving pain control using Lidocaine 5% topical ointment. No specimens were taken. A time out was conducted at 15:12, prior to the start of the procedure. A Minimum amount of bleeding was controlled with Pressure. The procedure was tolerated well. Post Debridement Measurements: 1cm length x 1cm width x 0.1cm depth; 0.079cm^3 volume. Character of Wound/Ulcer Post Debridement requires further debridement. Severity of Tissue Post Debridement is: Fat layer exposed. Post procedure Diagnosis Wound #6: Same as Pre-Procedure General Notes: Scribed for Dr [name] by [name] East, RN. Plan Follow-up Appointments: Return Appointment in 2 weeks. - Dr. Celine [name] Anesthetic: (In clinic) Topical Lidocaine 5% applied to wound bed - in clinic, prior to debridement Bathing/ Shower/ Hygiene: May shower with protection but do  not get wound dressing(s) wet. - Keep dressing dry at all times until next appointment. Can purchase cast protector from [facility] or [facility] Edema Control - Lymphedema / SCD / Other: Elevate legs to the level of the heart or above for 30 minutes daily and/or when sitting, a frequency of: Avoid standing for long periods of time. Patient to wear own compression stockings every day. Moisturize legs daily. WOUND #6: - Ankle Wound Laterality: Right, Lateral Cleanser: Soap and Water Discharge Instructions: May shower and wash wound with dial antibacterial soap and water prior to dressing change. Peri-Wound Care: Sween Lotion (Moisturizing lotion) Discharge Instructions: Apply moisturizing lotion as directed Prim Dressing: Promogran Prisma Matrix, 4.34 (sq in) (silver collagen) ary Discharge Instructions: Moisten collagen with saline or hydrogel Secondary Dressing: ABD Pad, 8x10 Discharge Instructions: Apply over primary dressing as  directed. Secured With: Transpore Surgical T ape, 2x10 (in/yd) Discharge Instructions: Secure dressing with tape as directed. Com pression Wrap: ThreePress (3 layer compression wrap) Discharge Instructions: Apply three layer compression as directed. 10/25/2022: The wound continues to contract. There is still thick layer of slough on the surface. The moisture balance of the wound is better, but the periwound skin is slightly macerated. I used a curette to debride slough and nonviable subcutaneous tissue from the wound. I think in my efforts to improve the moisture balance of the wound, I ended up getting some periwound tissue maceration so we will apply zinc oxide. Continue Prisma silver collagen moistened with hydrogel to the wound surface. Continue 3 layer compression. Follow-up in 1 week. Electronic Signature(s) Signed: 10/25/2022 4:19:19 PM By: Fredirick Maudlin MD FACS [place], [name] ([id number]) [id number] Page 8 of 10 Entered By: Fredirick Maudlin on 10/25/2022 16:19:18 -------------------------------------------------------------------------------- HxROS Details Patient Name: Date of Service: [name], [name] 10/25/2022 2:30 PM Medical Record Number: [id number] Patient Account Number: [id number] Date of Birth/Sex: Treating RN: 1946-06-26 (76 y.o. F) Primary Care Provider: Lorrene [name] Other Clinician: Referring Provider: Treating Provider/Extender: [name] in Treatment: 2 Information Obtained From Patient Constitutional Symptoms (General Health) Medical History: Past Medical History Notes: obesity Eyes Medical History: Positive for: Cataracts - mild Negative for: Glaucoma; Optic Neuritis Ear/Nose/Mouth/Throat Medical History: Negative for: Chronic sinus problems/congestion; Middle ear problems Past Medical History Notes: seasonal allergies Hematologic/Lymphatic Medical History: Positive for: Anemia -  iron deficiency; Lymphedema Negative for: Hemophilia; Human Immunodeficiency Virus; Sickle Cell Disease Respiratory Medical History: Negative for: Aspiration; Asthma; Chronic Obstructive Pulmonary Disease (COPD); Pneumothorax; Sleep Apnea; Tuberculosis Cardiovascular Medical History: Positive for: Congestive Heart Failure; Hypertension; Peripheral Venous Disease Negative for: Angina; Arrhythmia; Coronary Artery Disease; Deep Vein Thrombosis; Hypotension; Myocardial Infarction; Peripheral Arterial Disease; Phlebitis; Vasculitis Past Medical History Notes: aortic murmur, hyperlipidemia, hypertriglyceridemia Gastrointestinal Medical History: Negative for: Cirrhosis ; Colitis; Crohns; Hepatitis A; Hepatitis B; Hepatitis C Past Medical History Notes: UGI bleed, Endocrine Medical History: Negative for: Type I Diabetes; Type II Diabetes Past Medical History Notes: hypothyroidism Genitourinary Medical History: Negative for: End Stage Renal Disease [name], [name] ([id number]) [id number] Page 9 of 10 Immunological Medical History: Negative for: Lupus Erythematosus; Raynauds; Scleroderma Integumentary (Skin) Medical History: Negative for: History of Burn Musculoskeletal Medical History: Positive for: Osteoarthritis Negative for: Gout; Rheumatoid Arthritis; Osteomyelitis Neurologic Medical History: Positive for: Neuropathy - bila Negative for: Dementia; Quadriplegia; Paraplegia; Seizure Disorder Oncologic Medical History: Negative for: Received Chemotherapy; Received Radiation Psychiatric Medical History: Positive for: Confinement Anxiety Negative for: Anorexia/bulimia HBO Extended History Items Eyes: Cataracts Immunizations Pneumococcal Vaccine: Received Pneumococcal Vaccination: No Implantable Devices No devices  added Hospitalization / Surgery History Type of Hospitalization/Surgery right arm surgery gastric  bypass hysterectomy tonsillectomy hernia repair cervical myelopathy with spinal cord compression Family and Social History Cancer: Yes - Mother,Father; Diabetes: Yes - Father; Heart Disease: Yes - Father; Hereditary Spherocytosis: No; Hypertension: Yes - Father; Kidney Disease: No; Lung Disease: No; Seizures: No; Stroke: Yes - Mother; Thyroid Problems: No; Tuberculosis: No; Never smoker; Marital Status - Married; Alcohol Use: Daily - 1.5 bottles of wine daily; Drug Use: No History; Caffeine Use: Daily - coffee; Financial Concerns: No; Food, Clothing or Shelter Needs: No; Support System Lacking: No; Transportation Concerns: No Electronic Signature(s) Signed: 10/25/2022 4:27:34 PM By: Fredirick Maudlin MD FACS Entered By: Fredirick Maudlin on 10/25/2022 16:17:50 -------------------------------------------------------------------------------- SuperBill Details Patient Name: Date of Service: [name], Keener. 10/25/2022 Medical Record Number: [id number] Patient Account Number: [id number] [name], [name] ([id number]) [id number] Page 10 of 10 Date of Birth/Sex: Treating RN: [birth year] (76 y.o. F) Primary Care Provider: Lorrene [name] Other Clinician: Referring Provider: Treating Provider/Extender: [name] in Treatment: 2 Diagnosis Coding ICD-10 Codes Code Description [phone] Non-pressure chronic ulcer of right ankle with fat layer exposed I87.2 Venous insufficiency (chronic) (peripheral) [id number] Chronic diastolic (congestive) heart failure E66.09 Other obesity due to excess calories Facility Procedures : CPT4 Code: [id number] Description: [facility] - DEB SUBQ TISSUE 20 SQ CM/< ICD-10 Diagnosis Description L97.312 Non-pressure chronic ulcer of right ankle with fat layer exposed Modifier: Quantity: 1 Physician Procedures : CPT4 Code Description Modifier [id number] - WC PHYS LEVEL 3 - EST PT 25 ICD-10 Diagnosis Description  L97.312 Non-pressure chronic ulcer of right ankle with fat layer exposed I87.2 Venous insufficiency (chronic) (peripheral) 99991111 Chronic diastolic  (congestive) heart failure E66.09 Other obesity due to excess calories Quantity: 1 : [id number] - WC PHYS SUBQ TISS 20 SQ CM ICD-10 Diagnosis Description L97.312 Non-pressure chronic ulcer of right ankle with fat layer exposed Quantity: 1 Electronic Signature(s) Signed: 10/25/2022 4:19:33 PM By: Fredirick Maudlin MD FACS Entered By: Fredirick Maudlin on 10/25/2022 16:19:33

## 2022-11-02 ENCOUNTER — Encounter (HOSPITAL_BASED_OUTPATIENT_CLINIC_OR_DEPARTMENT_OTHER): Payer: PPO | Attending: General Surgery | Admitting: General Surgery

## 2022-11-02 DIAGNOSIS — M1990 Unspecified osteoarthritis, unspecified site: Secondary | ICD-10-CM | POA: Insufficient documentation

## 2022-11-02 DIAGNOSIS — I110 Hypertensive heart disease with heart failure: Secondary | ICD-10-CM | POA: Diagnosis not present

## 2022-11-02 DIAGNOSIS — I872 Venous insufficiency (chronic) (peripheral): Secondary | ICD-10-CM | POA: Insufficient documentation

## 2022-11-02 DIAGNOSIS — I5032 Chronic diastolic (congestive) heart failure: Secondary | ICD-10-CM | POA: Insufficient documentation

## 2022-11-02 DIAGNOSIS — I890 Lymphedema, not elsewhere classified: Secondary | ICD-10-CM | POA: Insufficient documentation

## 2022-11-02 DIAGNOSIS — E039 Hypothyroidism, unspecified: Secondary | ICD-10-CM | POA: Diagnosis not present

## 2022-11-02 DIAGNOSIS — L97312 Non-pressure chronic ulcer of right ankle with fat layer exposed: Secondary | ICD-10-CM | POA: Diagnosis not present

## 2022-11-03 NOTE — Progress Notes (Signed)
[name], [name] ([id number]) [id number] Page 1 of 6 Visit Report for 11/02/2022 Arrival Information Details Patient Name: Date of Service: [name], [name] 11/02/2022 1:15 PM Medical Record Number: [id number] Patient Account Number: [id number] Date of Birth/Sex: Treating RN: [birth year] (76 y.o. F) Primary Care Melvin Marmo: Mayer Masker Other Clinician: Referring Michole Lecuyer: Treating Yves Fodor/Extender: Marjean Donna in Treatment: 4 Visit Information History Since Last Visit All ordered tests and consults were completed: No Patient Arrived: Dan Humphreys Added or deleted any medications: No Arrival Time: 13:37 Any new allergies or adverse reactions: No Accompanied By: husband Had a fall or experienced change in No Transfer Assistance: None activities of daily living that may affect Patient Identification Verified: Yes risk of falls: Secondary Verification Process Completed: Yes Signs or symptoms of abuse/neglect since last visito No Patient Requires Transmission-Based Precautions: No Hospitalized since last visit: No Patient Has Alerts: No Implantable device outside of the clinic excluding No cellular tissue based products placed in the center since last visit: Pain Present Now: No Electronic Signature(s) Signed: 11/02/2022 1:51:17 PM By: Dayton Scrape Entered By: Dayton Scrape on 11/02/2022 13:37:51 -------------------------------------------------------------------------------- Compression Therapy Details Patient Name: Date of Service: [name], [name] 11/02/2022 1:15 PM Medical Record Number: [id number] Patient Account Number: [id number] Date of Birth/Sex: Treating RN: Sep 21, 1946 (76 y.o. [name] Primary Care [name]: Mayer Masker Other Clinician: Referring Meli Faley: Treating Ginelle Bays/Extender: Fernanda Drum Weeks in Treatment: 4 Compression Therapy Performed for Wound Assessment: Wound #6  Right,Lateral Ankle Performed By: Clinician Karie Schwalbe, RN Compression Type: Three Layer Post Procedure Diagnosis Same as Pre-procedure Electronic Signature(s) Signed: 11/02/2022 4:51:20 PM By: Karie Schwalbe RN Entered By: Karie Schwalbe on 11/02/2022 14:06:33 [name] ([id number]) [id number] Page 2 of 6 -------------------------------------------------------------------------------- Encounter Discharge Information Details Patient Name: Date of Service: [name], [name] 11/02/2022 1:15 PM Medical Record Number: [id number] Patient Account Number: [id number] Date of Birth/Sex: Treating RN: 1946/11/24 (76 y.o. [name] Primary Care [name]: Mayer Masker Other Clinician: Referring Fin [name]: Treating [name]/Extender: Marjean Donna in Treatment: 4 Encounter Discharge Information Items Post Procedure Vitals Discharge Condition: Stable Temperature (F): 97.9 Ambulatory Status: Cane Pulse (bpm): 73 Discharge Destination: Home Respiratory Rate (breaths/min): 20 Transportation: Private Auto Blood Pressure (mmHg): 158/87 Accompanied By: spouse Schedule Follow-up Appointment: Yes Clinical Summary of Care: Patient Declined Electronic Signature(s) Signed: 11/03/2022 8:23:56 AM By: Karie Schwalbe RN Entered By: Karie Schwalbe on 11/02/2022 17:24:52 -------------------------------------------------------------------------------- Lower Extremity Assessment Details Patient Name: Date of Service: [name], [name] 11/02/2022 1:15 PM Medical Record Number: [id number] Patient Account Number: [id number] Date of Birth/Sex: Treating RN: 1946/07/03 (76 y.o. [name] Primary Care [name]: Mayer Masker Other Clinician: Referring Tamme Mozingo: Treating Shon Mansouri/Extender: Fernanda Drum Weeks in Treatment: 4 Edema Assessment Assessed: [Left: No] [Right: No] [Left: Edema] [Right:  :] Calf Left: Right: Point of Measurement: From Medial Instep 33 cm Ankle Left: Right: Point of Measurement: From Medial Instep 23.4 cm Vascular Assessment Pulses: Dorsalis Pedis Palpable: [Right:Yes] Electronic Signature(s) Signed: 11/02/2022 4:51:20 PM By: Karie Schwalbe RN Entered By: Karie Schwalbe on 11/02/2022 14:28:19 Multi Wound Chart Details -------------------------------------------------------------------------------- [name] ([id number]) [id number] Page 3 of 6 Patient Name: Date of Service: [name], [name] 11/02/2022 1:15 PM Medical Record Number: [id number] Patient Account Number: [id number] Date of Birth/Sex: Treating RN: 21-Jun-1946 (76 y.o. F) Primary Care Harvy Riera: Mayer Masker Other Clinician: Referring Gwendlyon Zumbro: Treating Janaye [employer]/Extender: Fernanda Drum Weeks in Treatment: 4 Vital Signs Height(in): 65 Pulse(bpm): 73 Weight(lbs): 229  Blood Pressure(mmHg): 158/87 Body Mass Index(BMI): 38.1 Temperature(F): 97.9 Respiratory Rate(breaths/min): 20 Wound Assessments Wound Number: 6 N/A N/A Photos: N/A N/A Right, Lateral Ankle N/A N/A Wound Location: Gradually Appeared N/A N/A Wounding Event: Venous Leg Ulcer N/A N/A Primary Etiology: Cataracts, Anemia, Lymphedema, N/A N/A Comorbid History: Congestive Heart Failure, Hypertension, Peripheral Venous Disease, Osteoarthritis, Neuropathy, Confinement Anxiety 09/25/2022 N/A N/A Date Acquired: 4 N/A N/A Weeks of Treatment: Open N/A N/A Wound Status: No N/A N/A Wound Recurrence: 0.9x0.9x0.1 N/A N/A Measurements L x W x D (cm) 0.636 N/A N/A A (cm) : rea 0.064 N/A N/A Volume (cm) : 38.70% N/A N/A % Reduction in A rea: 38.50% N/A N/A % Reduction in Volume: Full Thickness Without Exposed N/A N/A Classification: Support Structures Medium N/A N/A Exudate A mount: Serous N/A N/A Exudate Type: amber N/A N/A Exudate Color: Flat  and Intact N/A N/A Wound Margin: Medium (34-66%) N/A N/A Granulation A mount: Pink N/A N/A Granulation Quality: Medium (34-66%) N/A N/A Necrotic A mount: Small (1-33%) N/A N/A Epithelialization: Debridement - Excisional N/A N/A Debridement: Pre-procedure Verification/Time Out 13:59 N/A N/A Taken: Lidocaine 4% Topical Solution N/A N/A Pain Control: Subcutaneous, Slough N/A N/A Tissue Debrided: Skin/Subcutaneous Tissue N/A N/A Level: 0.81 N/A N/A Debridement A (sq cm): rea Curette N/A N/A Instrument: Minimum N/A N/A Bleeding: Pressure N/A N/A Hemostasis A chieved: 0 N/A N/A Procedural Pain: 0 N/A N/A Post Procedural Pain: Procedure was tolerated well N/A N/A Debridement Treatment Response: 0.9x0.9x0.1 N/A N/A Post Debridement Measurements L x W x D (cm) 0.064 N/A N/A Post Debridement Volume: (cm) Dry/Scaly: Yes N/A N/A Periwound Skin Moisture: No Abnormality N/A N/A Temperature: Compression Therapy N/A N/A Procedures Performed: Debridement Treatment Notes Electronic Signature(s) Signed: 11/02/2022 2:13:17 PM By: Duanne Guess MD FACS Entered By: Duanne Guess on 11/02/2022 14:13:17 Mauricio Po, [name] ([id number]) [id number] Page 4 of 6 -------------------------------------------------------------------------------- Pain Assessment Details Patient Name: Date of Service: [name], [name] 11/02/2022 1:15 PM Medical Record Number: [id number] Patient Account Number: [id number] Date of Birth/Sex: Treating RN: [birth year] (76 y.o. F) Primary Care Claudia Alvizo: Mayer Masker Other Clinician: Referring Truth Wolaver: Treating Makaio Mach/Extender: Marjean Donna in Treatment: 4 Active Problems Location of Pain Severity and Description of Pain Patient Has Paino No Site Locations Pain Management and Medication Current Pain Management: Electronic Signature(s) Signed: 11/02/2022 1:51:17 PM By: Dayton Scrape Entered  By: Dayton Scrape on 11/02/2022 13:39:08 -------------------------------------------------------------------------------- Patient/Caregiver Education Details Patient Name: Date of Service: [name], [name] 12/7/2023andnbsp1:15 PM Medical Record Number: [id number] Patient Account Number: [id number] Date of Birth/Gender: Treating RN: Sep 06, 1946 (76 y.o. [name] Primary Care Physician: Mayer Masker Other Clinician: Referring Physician: Treating Physician/Extender: Marjean Donna in Treatment: 4 Education Assessment Education Provided To: Patient Education Topics Provided Wound/Skin Impairment: Methods: Explain/Verbal RAYLI, WIEDERHOLD ([id number]) [id number] Page 5 of 6 Responses: Return demonstration correctly Electronic Signature(s) Signed: 11/03/2022 8:23:56 AM By: Karie Schwalbe RN Entered By: Karie Schwalbe on 11/02/2022 17:23:33 -------------------------------------------------------------------------------- Wound Assessment Details Patient Name: Date of Service: [name], [name] 11/02/2022 1:15 PM Medical Record Number: [id number] Patient Account Number: [id number] Date of Birth/Sex: Treating RN: Dec 17, 1945 (76 y.o. F) Primary Care Jamond Neels: Mayer Masker Other Clinician: Referring Guilianna Mckoy: Treating Kia Varnadore/Extender: Fernanda Drum Weeks in Treatment: 4 Wound Status Wound Number: 6 Primary Venous Leg Ulcer Etiology: Wound Location: Right, Lateral Ankle Wound Open Wounding Event: Gradually Appeared Status: Date Acquired: 09/25/2022 Comorbid Cataracts, Anemia, Lymphedema, Congestive Heart Failure, Weeks Of Treatment: 4 History: Hypertension, Peripheral Venous Disease, Osteoarthritis, Clustered  Wound: No Neuropathy, Confinement Anxiety Photos Wound Measurements Length: (cm) 0.9 Width: (cm) 0.9 Depth: (cm) 0.1 Area: (cm) 0.636 Volume: (cm) 0.064 % Reduction in Area:  38.7% % Reduction in Volume: 38.5% Epithelialization: Small (1-33%) Wound Description Classification: Full Thickness Without Exposed Support Wound Margin: Flat and Intact Exudate Amount: Medium Exudate Type: Serous Exudate Color: amber Structures Foul Odor After Cleansing: No Slough/Fibrino Yes Wound Bed Granulation Amount: Medium (34-66%) Granulation Quality: Pink Necrotic Amount: Medium (34-66%) Necrotic Quality: Adherent Slough Periwound Skin Texture Texture Color No Abnormalities Noted: No No Abnormalities Noted: No Moisture Temperature / Pain No Abnormalities Noted: No Temperature: No Abnormality Dry / ScalyShantil Vallejo, [name] ([id number]) [phone].pdf Page 6 of 6 Treatment Notes Wound #6 (Ankle) Wound Laterality: Right, Lateral Cleanser Soap and Water Discharge Instruction: May shower and wash wound with dial antibacterial soap and water prior to dressing change. Peri-Wound Care Sween Lotion (Moisturizing lotion) Discharge Instruction: Apply moisturizing lotion as directed Topical Primary Dressing Promogran Prisma Matrix, 4.34 (sq in) (silver collagen) Discharge Instruction: Moisten collagen with saline or hydrogel Secondary Dressing ABD Pad, 8x10 Discharge Instruction: Apply over primary dressing as directed. Secured With Transpore Surgical Tape, 2x10 (in/yd) Discharge Instruction: Secure dressing with tape as directed. Compression Wrap ThreePress (3 layer compression wrap) Discharge Instruction: Apply three layer compression as directed. Compression Stockings Add-Ons Electronic Signature(s) Signed: 11/02/2022 1:51:17 PM By: Dayton Scrape Entered By: Dayton Scrape on 11/02/2022 13:49:23 -------------------------------------------------------------------------------- Vitals Details Patient Name: Date of Service: [name], Leata [name]. 11/02/2022 1:15 PM Medical Record Number: [id number] Patient Account Number: [id number] Date of  Birth/Sex: Treating RN: [birth year] (76 y.o. F) Primary Care Tyris Eliot: Mayer Masker Other Clinician: Referring Rilya Longo: Treating Andrew Blasius/Extender: Marjean Donna in Treatment: 4 Vital Signs Time Taken: 01:35 Temperature (F): 97.9 Height (in): 65 Pulse (bpm): 73 Weight (lbs): 229 Respiratory Rate (breaths/min): 20 Body Mass Index (BMI): 38.1 Blood Pressure (mmHg): 158/87 Reference Range: 80 - 120 mg / dl Electronic Signature(s) Signed: 11/02/2022 1:51:17 PM By: Dayton Scrape Entered By: Dayton Scrape on 11/02/2022 13:38:19

## 2022-11-03 NOTE — Progress Notes (Signed)
[name], [name] ([id number]) [id number] Page 1 of 10 Visit Report for 11/02/2022 Chief Complaint Document Details Patient Name: Date of Service: [name], [name] 11/02/2022 1:15 PM Medical Record Number: [id number] Patient Account Number: [id number] Date of Birth/Sex: Treating RN: 1946/09/09 (76 y.o. F) Primary Care Provider: Mayer [name] Other Clinician: Referring Provider: Treating Provider/Extender: [name] in Treatment: 4 Information Obtained from: Patient Chief Complaint Bilateral LE Ulcers 10/05/2022: RLE ulcer Electronic Signature(s) Signed: 11/02/2022 2:13:23 PM By: [name] Guess MD FACS Entered By: [name] on 11/02/2022 14:13:23 -------------------------------------------------------------------------------- Debridement Details Patient Name: Date of Service: [name], [name]. 11/02/2022 1:15 PM Medical Record Number: [id number] Patient Account Number: [id number] Date of Birth/Sex: Treating RN: 1946-02-15 (76 y.o. [name] Primary Care Provider: Mayer [name] Other Clinician: Referring Provider: Treating Provider/Extender: [name] in Treatment: 4 Debridement Performed for Assessment: Wound #6 Right,Lateral Ankle Performed By: Physician [name] Guess, MD Debridement Type: Debridement Severity of Tissue Pre Debridement: Fat layer exposed Level of Consciousness (Pre-procedure): Awake and Alert Pre-procedure Verification/Time Out Yes - 13:59 Taken: Start Time: 13:59 Pain Control: Lidocaine 4% T opical Solution T Area Debrided (L x W): otal 0.9 (cm) x 0.9 (cm) = 0.81 (cm) Tissue and other material debrided: Non-Viable, Slough, Subcutaneous, Slough Level: Skin/Subcutaneous Tissue Debridement Description: Excisional Instrument: Curette Bleeding: Minimum Hemostasis Achieved: Pressure End Time: 14:01 Procedural Pain: 0 Post Procedural Pain:  0 Response to Treatment: Procedure was tolerated well Level of Consciousness (Post- Awake and Alert procedure): Post Debridement Measurements of Total Wound Length: (cm) 0.9 Width: (cm) 0.9 Depth: (cm) 0.1 Volume: (cm) 0.064 [place], [name] ([id number]) [id number] Page 2 of 10 Character of Wound/Ulcer Post Debridement: Improved Severity of Tissue Post Debridement: Fat layer exposed Post Procedure Diagnosis Same as Pre-procedure Notes Scribed for Dr. Lady [name] by [name] Electronic Signature(s) Signed: 11/02/2022 2:59:42 PM By: [name] Guess MD FACS Signed: 11/02/2022 4:51:20 PM By: [name] Schwalbe RN Entered By: [name] on 11/02/2022 14:06:09 -------------------------------------------------------------------------------- HPI Details Patient Name: Date of Service: [name], [name]. 11/02/2022 1:15 PM Medical Record Number: [id number] Patient Account Number: [id number] Date of Birth/Sex: Treating RN: 01-12-1946 (76 y.o. F) Primary Care Provider: Mayer [name] Other Clinician: Referring Provider: Treating Provider/Extender: [name] in Treatment: 4 History of Present Illness HPI Description: 07/09/18 on evaluation today patient presents for bilateral lower extremity ulcers. She has a past medical history significant for chronic venous stasis, hypertension, and lower extremity edema with associated skin changes. Unfortunately she tells me that roughly 3 weeks ago she noted ulcers on the left lower extremity which started given her trouble. Subsequently she ended up proceeding with applying mupirocin ointment and was keeping this open air for the most part. She states that it would scab over somewhat and then reopened. The wounds have been training quite significantly. Fortunately there does not appear to be any evidence of overt infection there is some erythematous type skin changes but I believe this is likely  stasis dermatitis nothing more. Fortunately she has no evidence of any systemic infection. No fevers, chills, nausea, or vomiting noted at this time. She has been recommended before to wear compression stockings but she does not wear them on a regular basis. She has never been suggested to get Juxta-Lite compression wraps she was not aware of what these were. Patient had venous studies performed on 11/09/17 which showed no lower surety DVT or deep vein insufficiency noted. She did not have evaluation however of the superficial  veins. There was full compressibility noted throughout. Her arterial study which was performed on 11/18/17 actually showed that she has an ABI on the right of 1.17 with a TBI of 1.59 along with an ABI of 1.12 on the left and a TBI of 0.99. 07/17/18 on evaluation today patient appears to be doing much better in regard to her left lower Trinity ulcers. She's been tolerating the dressing changes without complication. There does not appear to be any evidence of infection which is good news. Overall I'm very pleased with the progress at this time. 07/24/18 on evaluation today patient's wound actually appears to be completely healed at this time. Fortunately there does not appear to be any evidence of infection which is good news. Overall I'm very happy with the progress that has been made. She did get her Velcro compression wraps. READMISSION 12/31/2020 This is a patient we had in this clinic in 2019 felt to have chronic venous insufficiency wounds on her lower extremities. She was discharged with bilateral compression stockings. She tells [place] that she never really wore them or at best wore them sparingly. She is back in clinic today having a ulcer on her left lateral lower extremity since October and one on the right anterior for the last 2 weeks. I do not know that she is doing any primary dressing on these certainly not wearing any form of compression. She is already been evaluated by  vein and vascular on 11/15/2020. They noted that she did have ulcers on the left leg in October that healed with a need Unna boot. She had a reflux study on 12/20 that did not show evidence of a DVT or venous reflux bilaterally. They was not felt that she had arterial insufficiency based on the fact that she had brisk biphasic Doppler signals in the bilateral DP PT. The patient comes in today with superficial wounds on the right anterior mid tibia and the left lateral calf. These are small appear to have healthy surfaces. Past medical history includes cervical and upper thoracic spine surgery on 7/21, hernia repair early in January, hypertension, hyperlipidemia, hypothyroidism, gastric bypass surgery, DVT in the right peroneal artery and left popliteal and peroneal veins. Next problem CHF. She does not have diabetes ABIs in our clinic were 1.1 on the right and 1.2 on the lower 2/11; the patient's wound on the left lateral and right anterior lower leg are closed for edema control is good. She has an assortment of old juxta lite stockings with old liners. She does not like to wear anything in particular under the compression part of the juxta lite stocking. I think she is going to do exactly what she wants. READMISSION 10/05/2022 This is a 76 year old woman last seen in our clinic about 18 months ago. She has a history of venous stasis and stasis dermatitis. A reflux study done in 2021 did not show any evidence of venous reflux. She says about 3 weeks ago, she noticed an ulcer on her right lateral malleolus. She was applying triple antibiotic ointment to it for a while and then was soaking it in Epsom salts. She reports that she usually wears compression stockings, but that they were rubbing so she did not wear 1 on her right leg since the wound appeared, she has a stocking with the toe and heel cut out applied to her left leg. On examination, she has changes of chronic stasis dermatitis on the right.  There is a shallow ulcer overlying the lateral malleolus. The  fat layer is exposed. It is a little bit desiccated but fairly clean with just some light eschar and slough present. No malodor or purulent drainage. [name], [name] ([id number]) [id number] Page 3 of 10 10/13/2022: The wound is a little smaller today. Although the moisture balance is better than last week, it is still a bit dry. There is still some slough accumulation on the surface. 10/25/2022: The wound continues to contract. There is still thick layer of slough on the surface. The moisture balance of the wound is better, but the periwound skin is slightly macerated. 11/02/2022: The wound is smaller and shallower today. There is still slough on the surface, but the periwound skin is intact. The wound surface is still drier than ideal. Electronic Signature(s) Signed: 11/02/2022 2:13:51 PM By: [name] Guessannon, Denard Tuminello MD FACS Entered By: [name] Guessannon, Savannha Welle on 11/02/2022 14:13:51 -------------------------------------------------------------------------------- Physical Exam Details Patient Name: Date of Service: [name] HaddockMCCRA CKEN, [name] BrayLYNDA [name]. 11/02/2022 1:15 PM Medical Record Number: [id number] Patient Account Number: [id number] Date of Birth/Sex: Treating RN: [birth year] (76 y.o. F) Primary Care Provider: [name], [name] Other Clinician: Referring Provider: Treating Provider/Extender: [name] Donnaannon, Trelyn Vanderlinde [name], [name] Weeks in Treatment: 4 Constitutional She is hypertensive, but asymptomatic.. . . . No acute distress. Respiratory Normal work of breathing on room air. Notes 11/02/2022: The wound is smaller and shallower today. There is still slough on the surface, but the periwound skin is intact. The wound surface is still drier than ideal. Electronic Signature(s) Signed: 11/02/2022 2:14:24 PM By: [name] Guessannon, Teige Rountree MD FACS Entered By: [name] Guessannon, Rebecka Oelkers on 11/02/2022  14:14:24 -------------------------------------------------------------------------------- Physician Orders Details Patient Name: Date of Service: [name] HaddockMCCRA CKEN, [name] BrayLYNDA [name]. 11/02/2022 1:15 PM Medical Record Number: [id number] Patient Account Number: [id number] Date of Birth/Sex: Treating RN: [birth year] (76 y.o. [name] BlazingF) [place]ton, Joanne Primary Care Provider: [name], [name] Other Clinician: Referring Provider: Treating Provider/Extender: [name] Donnaannon, [employer]on [employer] [name], [name] Weeks in Treatment: 4 Verbal / Phone Orders: No Diagnosis Coding ICD-10 Coding Code Description [phone] Non-pressure chronic ulcer of right ankle with fat layer exposed I87.2 Venous insufficiency (chronic) (peripheral) I50.32 Chronic diastolic (congestive) heart failure E66.09 Other obesity due to excess calories Follow-up Appointments ppointment in 1 week. - Dr. Lady Garyannon Room 1 Return A [place], [place] [name] ([id number]) [phone]) [id number] Page 4 of 10 Anesthetic (In clinic) Topical Lidocaine 5% applied to wound bed - in clinic, prior to debridement Bathing/ Shower/ Hygiene May shower with protection but do not get wound dressing(s) wet. - Keep dressing dry at all times until next appointment. Can purchase cast protector from [facility] or [facility] Edema Control - Lymphedema / SCD / Other Bilateral Lower Extremities Elevate legs to the level of the heart or above for 30 minutes daily and/or when sitting, a frequency of: Avoid standing for long periods of time. Patient to wear own compression stockings every day. Moisturize legs daily. Wound Treatment Wound #6 - Ankle Wound Laterality: Right, Lateral Cleanser: Soap and Water Discharge Instructions: May shower and wash wound with dial antibacterial soap and water prior to dressing change. Peri-Wound Care: Sween Lotion (Moisturizing lotion) Discharge Instructions: Apply moisturizing lotion as directed Prim Dressing: Promogran Prisma Matrix, 4.34 (sq in) (silver  collagen) ary Discharge Instructions: Moisten collagen with saline or hydrogel Secondary Dressing: ABD Pad, 8x10 Discharge Instructions: Apply over primary dressing as directed. Secured With: Transpore Surgical Tape, 2x10 (in/yd) Discharge Instructions: Secure dressing with tape as directed. Compression Wrap: ThreePress (3 layer compression wrap) Discharge Instructions: Apply three layer compression as directed. Electronic Signature(s) Signed: 11/03/2022 7:32:56 AM By: [name] Guessannon, Erionna Strum MD FACS Signed: 11/03/2022 8:23:56  AM By: [name] Schwalbe RN Previous Signature: 11/02/2022 2:59:42 PM Version By: [name] Guess MD FACS Previous Signature: 11/02/2022 4:51:20 PM Version By: [name] Schwalbe RN Entered By: [name] on 11/02/2022 17:23:15 -------------------------------------------------------------------------------- Problem List Details Patient Name: Date of Service: [name], Jessy [name]. 11/02/2022 1:15 PM Medical Record Number: [id number] Patient Account Number: [id number] Date of Birth/Sex: Treating RN: March 07, 1946 (76 y.o. F) Primary Care Provider: Mayer [name] Other Clinician: Referring Provider: Treating Provider/Extender: [name] in Treatment: 4 Active Problems ICD-10 Encounter Code Description Active Date MDM Diagnosis L97.312 Non-pressure chronic ulcer of right ankle with fat layer exposed 10/05/2022 No Yes I87.2 Venous insufficiency (chronic) (peripheral) 10/05/2022 No Yes I50.32 Chronic diastolic (congestive) heart failure 10/05/2022 No Yes [place], Sebrina [name] ([id number]) [phone].pdf Page 5 of 10 E66.09 Other obesity due to excess calories 10/05/2022 No Yes Inactive Problems Resolved Problems Electronic Signature(s) Signed: 11/02/2022 2:13:11 PM By: [name] Guess MD FACS Entered By: [name] on 11/02/2022  14:13:10 -------------------------------------------------------------------------------- Progress Note Details Patient Name: Date of Service: [name], Tomiko [name]. 11/02/2022 1:15 PM Medical Record Number: [id number] Patient Account Number: [id number] Date of Birth/Sex: Treating RN: 1946/11/24 (76 y.o. F) Primary Care Provider: Mayer [name] Other Clinician: Referring Provider: Treating Provider/Extender: [name] in Treatment: 4 Subjective Chief Complaint Information obtained from Patient Bilateral LE Ulcers 10/05/2022: RLE ulcer History of Present Illness (HPI) 07/09/18 on evaluation today patient presents for bilateral lower extremity ulcers. She has a past medical history significant for chronic venous stasis, hypertension, and lower extremity edema with associated skin changes. Unfortunately she tells me that roughly 3 weeks ago she noted ulcers on the left lower extremity which started given her trouble. Subsequently she ended up proceeding with applying mupirocin ointment and was keeping this open air for the most part. She states that it would scab over somewhat and then reopened. The wounds have been training quite significantly. Fortunately there does not appear to be any evidence of overt infection there is some erythematous type skin changes but I believe this is likely stasis dermatitis nothing more. Fortunately she has no evidence of any systemic infection. No fevers, chills, nausea, or vomiting noted at this time. She has been recommended before to wear compression stockings but she does not wear them on a regular basis. She has never been suggested to get Juxta-Lite compression wraps she was not aware of what these were. Patient had venous studies performed on 11/09/17 which showed no lower surety DVT or deep vein insufficiency noted. She did not have evaluation however of the superficial veins. There was full compressibility noted throughout.  Her arterial study which was performed on 11/18/17 actually showed that she has an ABI on the right of 1.17 with a TBI of 1.59 along with an ABI of 1.12 on the left and a TBI of 0.99. 07/17/18 on evaluation today patient appears to be doing much better in regard to her left lower Trinity ulcers. She's been tolerating the dressing changes without complication. There does not appear to be any evidence of infection which is good news. Overall I'm very pleased with the progress at this time. 07/24/18 on evaluation today patient's wound actually appears to be completely healed at this time. Fortunately there does not appear to be any evidence of infection which is good news. Overall I'm very happy with the progress that has been made. She did get her Velcro compression wraps. READMISSION 12/31/2020 This is a patient we had in this clinic in 2019 felt  to have chronic venous insufficiency wounds on her lower extremities. She was discharged with bilateral compression stockings. She tells [place] that she never really wore them or at best wore them sparingly. She is back in clinic today having a ulcer on her left lateral lower extremity since October and one on the right anterior for the last 2 weeks. I do not know that she is doing any primary dressing on these certainly not wearing any form of compression. She is already been evaluated by vein and vascular on 11/15/2020. They noted that she did have ulcers on the left leg in October that healed with a need Unna boot. She had a reflux study on 12/20 that did not show evidence of a DVT or venous reflux bilaterally. They was not felt that she had arterial insufficiency based on the fact that she had brisk biphasic Doppler signals in the bilateral DP PT. The patient comes in today with superficial wounds on the right anterior mid tibia and the left lateral calf. These are small appear to have healthy surfaces. Past medical history includes cervical and upper thoracic  spine surgery on 7/21, hernia repair early in January, hypertension, hyperlipidemia, hypothyroidism, gastric bypass surgery, DVT in the right peroneal artery and left popliteal and peroneal veins. Next problem CHF. She does not have diabetes ABIs in our clinic were 1.1 on the right and 1.2 on the lower 2/11; the patient's wound on the left lateral and right anterior lower leg are closed for edema control is good. She has an assortment of old juxta lite stockings with old liners. She does not like to wear anything in particular under the compression part of the juxta lite stocking. I think she is going to do exactly what she wants. READMISSION 10/05/2022 This is a 76 year old woman last seen in our clinic about 18 months ago. She has a history of venous stasis and stasis dermatitis. A reflux study done in 2021 did not show any evidence of venous reflux. She says about 3 weeks ago, she noticed an ulcer on her right lateral malleolus. She was applying triple antibiotic [name], [name] ([id number]) [phone].pdf Page 6 of 10 ointment to it for a while and then was soaking it in Epsom salts. She reports that she usually wears compression stockings, but that they were rubbing so she did not wear 1 on her right leg since the wound appeared, she has a stocking with the toe and heel cut out applied to her left leg. On examination, she has changes of chronic stasis dermatitis on the right. There is a shallow ulcer overlying the lateral malleolus. The fat layer is exposed. It is a little bit desiccated but fairly clean with just some light eschar and slough present. No malodor or purulent drainage. 10/13/2022: The wound is a little smaller today. Although the moisture balance is better than last week, it is still a bit dry. There is still some slough accumulation on the surface. 10/25/2022: The wound continues to contract. There is still thick layer of slough on the surface. The  moisture balance of the wound is better, but the periwound skin is slightly macerated. 11/02/2022: The wound is smaller and shallower today. There is still slough on the surface, but the periwound skin is intact. The wound surface is still drier than ideal. Patient History Information obtained from Patient. Family History Cancer - Mother,Father, Diabetes - Father, Heart Disease - Father, Hypertension - Father, Stroke - Mother, No family history of Hereditary Spherocytosis, Kidney Disease,  Lung Disease, Seizures, Thyroid Problems, Tuberculosis. Social History Never smoker, Marital Status - Married, Alcohol Use - Daily - 1.5 bottles of wine daily, Drug Use - No History, Caffeine Use - Daily - coffee. Medical History Eyes Patient has history of Cataracts - mild Denies history of Glaucoma, Optic Neuritis Ear/Nose/Mouth/Throat Denies history of Chronic sinus problems/congestion, Middle ear problems Hematologic/Lymphatic Patient has history of Anemia - iron deficiency, Lymphedema Denies history of Hemophilia, Human Immunodeficiency Virus, Sickle Cell Disease Respiratory Denies history of Aspiration, Asthma, Chronic Obstructive Pulmonary Disease (COPD), Pneumothorax, Sleep Apnea, Tuberculosis Cardiovascular Patient has history of Congestive Heart Failure, Hypertension, Peripheral Venous Disease Denies history of Angina, Arrhythmia, Coronary Artery Disease, Deep Vein Thrombosis, Hypotension, Myocardial Infarction, Peripheral Arterial Disease, Phlebitis, Vasculitis Gastrointestinal Denies history of Cirrhosis , Colitis, Crohnoos, Hepatitis A, Hepatitis B, Hepatitis C Endocrine Denies history of Type I Diabetes, Type II Diabetes Genitourinary Denies history of End Stage Renal Disease Immunological Denies history of Lupus Erythematosus, Raynaudoos, Scleroderma Integumentary (Skin) Denies history of History of Burn Musculoskeletal Patient has history of Osteoarthritis Denies history of  Gout, Rheumatoid Arthritis, Osteomyelitis Neurologic Patient has history of Neuropathy - bila Denies history of Dementia, Quadriplegia, Paraplegia, Seizure Disorder Oncologic Denies history of Received Chemotherapy, Received Radiation Psychiatric Patient has history of Confinement Anxiety Denies history of Anorexia/bulimia Hospitalization/Surgery History - right arm surgery. - gastric bypass. - hysterectomy. - tonsillectomy. - hernia repair. - cervical myelopathy with spinal cord compression. Medical A Surgical History Notes nd Constitutional Symptoms (General Health) obesity Ear/Nose/Mouth/Throat seasonal allergies Cardiovascular aortic murmur, hyperlipidemia, hypertriglyceridemia Gastrointestinal UGI bleed, Endocrine hypothyroidism Objective Constitutional [name], [name] ([id number]) [id number] Page 7 of 10 She is hypertensive, but asymptomatic.Marland Kitchen No acute distress. Vitals Time Taken: 1:35 AM, Height: 65 in, Weight: 229 lbs, BMI: 38.1, Temperature: 97.9 F, Pulse: 73 bpm, Respiratory Rate: 20 breaths/min, Blood Pressure: 158/87 mmHg. Respiratory Normal work of breathing on room air. General Notes: 11/02/2022: The wound is smaller and shallower today. There is still slough on the surface, but the periwound skin is intact. The wound surface is still drier than ideal. Integumentary (Hair, Skin) Wound #6 status is Open. Original cause of wound was Gradually Appeared. The date acquired was: 09/25/2022. The wound has been in treatment 4 weeks. The wound is located on the Right,Lateral Ankle. The wound measures 0.9cm length x 0.9cm width x 0.1cm depth; 0.636cm^2 area and 0.064cm^3 volume. There is a medium amount of serous drainage noted. The wound margin is flat and intact. There is medium (34-66%) pink granulation within the wound bed. There is a medium (34-66%) amount of necrotic tissue within the wound bed including Adherent Slough. The periwound  skin appearance exhibited: Dry/Scaly. Periwound temperature was noted as No Abnormality. Assessment Active Problems ICD-10 Non-pressure chronic ulcer of right ankle with fat layer exposed Venous insufficiency (chronic) (peripheral) Chronic diastolic (congestive) heart failure Other obesity due to excess calories Procedures Wound #6 Pre-procedure diagnosis of Wound #6 is a Venous Leg Ulcer located on the Right,Lateral Ankle .Severity of Tissue Pre Debridement is: Fat layer exposed. There was a Excisional Skin/Subcutaneous Tissue Debridement with a total area of 0.81 sq cm performed by [name] Guess, MD. With the following instrument(s): Curette to remove Non-Viable tissue/material. Material removed includes Subcutaneous Tissue and Slough and after achieving pain control using Lidocaine 4% T opical Solution. No specimens were taken. A time out was conducted at 13:59, prior to the start of the procedure. A Minimum amount of bleeding was controlled with Pressure. The procedure was tolerated well with  a pain level of 0 throughout and a pain level of 0 following the procedure. Post Debridement Measurements: 0.9cm length x 0.9cm width x 0.1cm depth; 0.064cm^3 volume. Character of Wound/Ulcer Post Debridement is improved. Severity of Tissue Post Debridement is: Fat layer exposed. Post procedure Diagnosis Wound #6: Same as Pre-Procedure General Notes: Scribed for Dr. Lady [name] by [name]. Pre-procedure diagnosis of Wound #6 is a Venous Leg Ulcer located on the Right,Lateral Ankle . There was a Three Layer Compression Therapy Procedure by [name] Schwalbe, RN. Post procedure Diagnosis Wound #6: Same as Pre-Procedure Plan 11/02/2022: The wound is smaller and shallower today. There is still slough on the surface, but the periwound skin is intact. The wound surface is still drier than ideal. I used a curette to debride the slough and a bit of nonviable subcutaneous tissue from her wound. Will continue  the Prisma silver collagen with a liberal amount of hydrogel. Continue 3 layer compression. Follow-up in 1 week. Electronic Signature(s) Signed: 11/02/2022 2:17:52 PM By: [name] Guess MD FACS Entered By: [name] on 11/02/2022 14:17:51 [name] Barrios ([id number]) [id number] Page 8 of 10 -------------------------------------------------------------------------------- HxROS Details Patient Name: Date of Service: [name], [name] 11/02/2022 1:15 PM Medical Record Number: [id number] Patient Account Number: [id number] Date of Birth/Sex: Treating RN: 1946/08/12 (76 y.o. F) Primary Care Provider: Mayer [name] Other Clinician: Referring Provider: Treating Provider/Extender: [name] in Treatment: 4 Information Obtained From Patient Constitutional Symptoms (General Health) Medical History: Past Medical History Notes: obesity Eyes Medical History: Positive for: Cataracts - mild Negative for: Glaucoma; Optic Neuritis Ear/Nose/Mouth/Throat Medical History: Negative for: Chronic sinus problems/congestion; Middle ear problems Past Medical History Notes: seasonal allergies Hematologic/Lymphatic Medical History: Positive for: Anemia - iron deficiency; Lymphedema Negative for: Hemophilia; Human Immunodeficiency Virus; Sickle Cell Disease Respiratory Medical History: Negative for: Aspiration; Asthma; Chronic Obstructive Pulmonary Disease (COPD); Pneumothorax; Sleep Apnea; Tuberculosis Cardiovascular Medical History: Positive for: Congestive Heart Failure; Hypertension; Peripheral Venous Disease Negative for: Angina; Arrhythmia; Coronary Artery Disease; Deep Vein Thrombosis; Hypotension; Myocardial Infarction; Peripheral Arterial Disease; Phlebitis; Vasculitis Past Medical History Notes: aortic murmur, hyperlipidemia, hypertriglyceridemia Gastrointestinal Medical History: Negative for: Cirrhosis ; Colitis;  Crohns; Hepatitis A; Hepatitis B; Hepatitis C Past Medical History Notes: UGI bleed, Endocrine Medical History: Negative for: Type I Diabetes; Type II Diabetes Past Medical History Notes: hypothyroidism Genitourinary Medical History: Negative for: End Stage Renal Disease Immunological Medical History: Negative for: Lupus Erythematosus; Raynauds; Scleroderma Integumentary (Skin) Medical History: Negative for: History of Burn [name], [name] ([id number]) [id number] Page 9 of 10 Musculoskeletal Medical History: Positive for: Osteoarthritis Negative for: Gout; Rheumatoid Arthritis; Osteomyelitis Neurologic Medical History: Positive for: Neuropathy - bila Negative for: Dementia; Quadriplegia; Paraplegia; Seizure Disorder Oncologic Medical History: Negative for: Received Chemotherapy; Received Radiation Psychiatric Medical History: Positive for: Confinement Anxiety Negative for: Anorexia/bulimia HBO Extended History Items Eyes: Cataracts Immunizations Pneumococcal Vaccine: Received Pneumococcal Vaccination: No Implantable Devices No devices added Hospitalization / Surgery History Type of Hospitalization/Surgery right arm surgery gastric bypass hysterectomy tonsillectomy hernia repair cervical myelopathy with spinal cord compression Family and Social History Cancer: Yes - Mother,Father; Diabetes: Yes - Father; Heart Disease: Yes - Father; Hereditary Spherocytosis: No; Hypertension: Yes - Father; Kidney Disease: No; Lung Disease: No; Seizures: No; Stroke: Yes - Mother; Thyroid Problems: No; Tuberculosis: No; Never smoker; Marital Status - Married; Alcohol Use: Daily - 1.5 bottles of wine daily; Drug Use: No History; Caffeine Use: Daily - coffee; Financial Concerns: No; Food, Clothing or Shelter Needs: No; Support System Lacking: No; Transportation  Concerns: No Electronic Signature(s) Signed: 11/02/2022 2:59:42 PM By: [name] Guess MD  FACS Entered By: [name] on 11/02/2022 14:13:58 -------------------------------------------------------------------------------- SuperBill Details Patient Name: Date of Service: [name], Ludwika [name]. 11/02/2022 Medical Record Number: [id number] Patient Account Number: [id number] Date of Birth/Sex: Treating RN: 08/25/1946 (76 y.o. F) Primary Care Provider: Mayer [name] Other Clinician: Referring Provider: Treating Provider/Extender: [name] in Treatment: 4 Diagnosis Coding ICD-10 Codes MIETTE, MOLENDA ([id number]) [id number] Page 10 of 10 Code Description L97.312 Non-pressure chronic ulcer of right ankle with fat layer exposed I87.2 Venous insufficiency (chronic) (peripheral) I50.32 Chronic diastolic (congestive) heart failure E66.09 Other obesity due to excess calories Facility Procedures : CPT4 Code: [id number] Description: 11042 - DEB SUBQ TISSUE 20 SQ CM/< ICD-10 Diagnosis Description L97.312 Non-pressure chronic ulcer of right ankle with fat layer exposed Modifier: Quantity: 1 Physician Procedures : CPT4 Code Description Modifier [id number] 99213 - WC PHYS LEVEL 3 - EST PT 25 ICD-10 Diagnosis Description L97.312 Non-pressure chronic ulcer of right ankle with fat layer exposed I87.2 Venous insufficiency (chronic) (peripheral) I50.32 Chronic diastolic  (congestive) heart failure E66.09 Other obesity due to excess calories Quantity: 1 : [id number] 11042 - WC PHYS SUBQ TISS 20 SQ CM ICD-10 Diagnosis Description L97.312 Non-pressure chronic ulcer of right ankle with fat layer exposed Quantity: 1 Electronic Signature(s) Signed: 11/02/2022 2:19:28 PM By: [name] Guess MD FACS Entered By: [name] on 11/02/2022 14:19:28

## 2022-11-08 ENCOUNTER — Telehealth: Payer: Self-pay

## 2022-11-08 NOTE — Telephone Encounter (Signed)
Pt called needing an order for Mammogram sign by Herbert Seta since Kandis Cocking is no longer here.  Signed and faxed back to [phone]

## 2022-11-09 ENCOUNTER — Other Ambulatory Visit: Payer: Self-pay | Admitting: Physician Assistant

## 2022-11-09 DIAGNOSIS — Z1231 Encounter for screening mammogram for malignant neoplasm of breast: Secondary | ICD-10-CM

## 2022-11-10 ENCOUNTER — Other Ambulatory Visit: Payer: Self-pay | Admitting: Physician Assistant

## 2022-11-10 ENCOUNTER — Encounter (HOSPITAL_BASED_OUTPATIENT_CLINIC_OR_DEPARTMENT_OTHER): Payer: PPO | Admitting: Internal Medicine

## 2022-11-10 DIAGNOSIS — L97312 Non-pressure chronic ulcer of right ankle with fat layer exposed: Secondary | ICD-10-CM | POA: Diagnosis not present

## 2022-11-10 DIAGNOSIS — Z1231 Encounter for screening mammogram for malignant neoplasm of breast: Secondary | ICD-10-CM

## 2022-11-10 DIAGNOSIS — I872 Venous insufficiency (chronic) (peripheral): Secondary | ICD-10-CM | POA: Diagnosis not present

## 2022-11-10 NOTE — Progress Notes (Signed)
LACOSTA, HARGAN ([id number]) [id number] Page 1 of 7 Visit Report for 11/10/2022 Debridement Details Patient Name: Date of Service: [name], [name] 11/10/2022 1:00 PM Medical Record Number: [id number] Patient Account Number: [id number] Date of Birth/Sex: Treating RN: 06-Mar-1946 (76 y.o. F) Primary Care Provider: Mayer Masker Other Clinician: Referring Provider: Treating Provider/Extender: Manning Charity in Treatment: 5 Debridement Performed for Assessment: Wound #6 Right,Lateral Ankle Performed By: Physician Maxwell Caul., MD Debridement Type: Debridement Severity of Tissue Pre Debridement: Fat layer exposed Level of Consciousness (Pre-procedure): Awake and Alert Pre-procedure Verification/Time Out Yes - 13:27 Taken: Start Time: 13:28 T Area Debrided (L x W): otal 0.9 (cm) x 1 (cm) = 0.9 (cm) Tissue and other material debrided: Non-Viable, Slough, Slough Level: Non-Viable Tissue Debridement Description: Selective/Open Wound Instrument: Curette Bleeding: Minimum Hemostasis Achieved: Pressure Response to Treatment: Procedure was tolerated well Level of Consciousness (Post- Awake and Alert procedure): Post Debridement Measurements of Total Wound Length: (cm) 0.9 Width: (cm) 1 Depth: (cm) 0.2 Volume: (cm) 0.141 Character of Wound/Ulcer Post Debridement: Requires Further Debridement Severity of Tissue Post Debridement: Fat layer exposed Post Procedure Diagnosis Same as Pre-procedure Notes Scribed for Dr. Leanord Hawking by Tommie Ard, RN Electronic Signature(s) Signed: 11/10/2022 4:40:59 PM By: Baltazar Najjar MD Entered By: Baltazar Najjar on 11/10/2022 13:35:23 -------------------------------------------------------------------------------- HPI Details Patient Name: Date of Service: [name], [name] 11/10/2022 1:00 PM Medical Record Number: [id number] Patient Account Number: [id number] Date of Birth/Sex:  Treating RN: 01-Jan-1946 (76 y.o. F) Primary Care Provider: Mayer Masker Other Clinician: Referring Provider: Treating Provider/Extender: Manning Charity in Treatment: 5 History of Present Illness [name], [name] H ([id number]) [id number] Page 2 of 7 HPI Description: 07/09/18 on evaluation today patient presents for bilateral lower extremity ulcers. She has a past medical history significant for chronic venous stasis, hypertension, and lower extremity edema with associated skin changes. Unfortunately she tells me that roughly 3 weeks ago she noted ulcers on the left lower extremity which started given her trouble. Subsequently she ended up proceeding with applying mupirocin ointment and was keeping this open air for the most part. She states that it would scab over somewhat and then reopened. The wounds have been training quite significantly. Fortunately there does not appear to be any evidence of overt infection there is some erythematous type skin changes but I believe this is likely stasis dermatitis nothing more. Fortunately she has no evidence of any systemic infection. No fevers, chills, nausea, or vomiting noted at this time. She has been recommended before to wear compression stockings but she does not wear them on a regular basis. She has never been suggested to get Juxta-Lite compression wraps she was not aware of what these were. Patient had venous studies performed on 11/09/17 which showed no lower surety DVT or deep vein insufficiency noted. She did not have evaluation however of the superficial veins. There was full compressibility noted throughout. Her arterial study which was performed on 11/18/17 actually showed that she has an ABI on the right of 1.17 with a TBI of 1.59 along with an ABI of 1.12 on the left and a TBI of 0.99. 07/17/18 on evaluation today patient appears to be doing much better in regard to her left lower Trinity  ulcers. She's been tolerating the dressing changes without complication. There does not appear to be any evidence of infection which is good news. Overall I'm very pleased with the progress at this time. 07/24/18 on evaluation today patient's wound actually appears to  be completely healed at this time. Fortunately there does not appear to be any evidence of infection which is good news. Overall I'm very happy with the progress that has been made. She did get her Velcro compression wraps. READMISSION 12/31/2020 This is a patient we had in this clinic in 2019 felt to have chronic venous insufficiency wounds on her lower extremities. She was discharged with bilateral compression stockings. She tells [place] that she never really wore them or at best wore them sparingly. She is back in clinic today having a ulcer on her left lateral lower extremity since October and one on the right anterior for the last 2 weeks. I do not know that she is doing any primary dressing on these certainly not wearing any form of compression. She is already been evaluated by vein and vascular on 11/15/2020. They noted that she did have ulcers on the left leg in October that healed with a need Unna boot. She had a reflux study on 12/20 that did not show evidence of a DVT or venous reflux bilaterally. They was not felt that she had arterial insufficiency based on the fact that she had brisk biphasic Doppler signals in the bilateral DP PT. The patient comes in today with superficial wounds on the right anterior mid tibia and the left lateral calf. These are small appear to have healthy surfaces. Past medical history includes cervical and upper thoracic spine surgery on 7/21, hernia repair early in January, hypertension, hyperlipidemia, hypothyroidism, gastric bypass surgery, DVT in the right peroneal artery and left popliteal and peroneal veins. Next problem CHF. She does not have diabetes ABIs in our clinic were 1.1 on the right and 1.2  on the lower 2/11; the patient's wound on the left lateral and right anterior lower leg are closed for edema control is good. She has an assortment of old juxta lite stockings with old liners. She does not like to wear anything in particular under the compression part of the juxta lite stocking. I think she is going to do exactly what she wants. READMISSION 10/05/2022 This is a 76 year old woman last seen in our clinic about 18 months ago. She has a history of venous stasis and stasis dermatitis. A reflux study done in 2021 did not show any evidence of venous reflux. She says about 3 weeks ago, she noticed an ulcer on her right lateral malleolus. She was applying triple antibiotic ointment to it for a while and then was soaking it in Epsom salts. She reports that she usually wears compression stockings, but that they were rubbing so she did not wear 1 on her right leg since the wound appeared, she has a stocking with the toe and heel cut out applied to her left leg. On examination, she has changes of chronic stasis dermatitis on the right. There is a shallow ulcer overlying the lateral malleolus. The fat layer is exposed. It is a little bit desiccated but fairly clean with just some light eschar and slough present. No malodor or purulent drainage. 10/13/2022: The wound is a little smaller today. Although the moisture balance is better than last week, it is still a bit dry. There is still some slough accumulation on the surface. 10/25/2022: The wound continues to contract. There is still thick layer of slough on the surface. The moisture balance of the wound is better, but the periwound skin is slightly macerated. 11/02/2022: The wound is smaller and shallower today. There is still slough on the surface, but the periwound  skin is intact. The wound surface is still drier than ideal. 12/15; wound is about the same size minimal depth. Under illumination some debris on the surface she has been using  Radiographer, therapeutic) Signed: 11/10/2022 4:40:59 PM By: Baltazar Najjar MD Entered By: Baltazar Najjar on 11/10/2022 13:36:11 -------------------------------------------------------------------------------- Physical Exam Details Patient Name: Date of Service: [name], Keyleigh H. 11/10/2022 1:00 PM Medical Record Number: [id number] Patient Account Number: [id number] Date of Birth/Sex: Treating RN: 10/18/1946 (76 y.o. F) Primary Care Provider: Mayer Masker Other Clinician: Referring Provider: Treating Provider/Extender: Manning Charity in Treatment: 5 Constitutional Patient is hypertensive.. Pulse regular and within target range for patient.Marland Kitchen Respirations regular, non-labored and within target range.. Temperature is normal and Daggs, Huldah H ([id number]) [phone].pdf Page 3 of 7 within the target range for the patient.Marland Kitchen Appears in no distress. Notes Wound exam; it appears that the wound is gotten shallower. I used a #3 curette to remove some fibrinous debris from the surface hemostasis with direct pressure. There is no evidence of surrounding infection in terms of tenderness although there is some erythema I suspect this is chronic rather than infectious. Her edema control is good and her lower leg appears that she has lymphedema above the knee Electronic Signature(s) Signed: 11/10/2022 4:40:59 PM By: Baltazar Najjar MD Entered By: Baltazar Najjar on 11/10/2022 13:37:01 -------------------------------------------------------------------------------- Physician Orders Details Patient Name: Date of Service: [name], Pauline H. 11/10/2022 1:00 PM Medical Record Number: [id number] Patient Account Number: [id number] Date of Birth/Sex: Treating RN: Jul 21, 1946 (76 y.o. [name] Primary Care Provider: Mayer Masker Other Clinician: Referring Provider: Treating Provider/Extender: Manning Charity  in Treatment: 5 Verbal / Phone Orders: No Diagnosis Coding Follow-up Appointments ppointment in 1 week. - Dr. Lady Gary Room 1 Return A Anesthetic (In clinic) Topical Lidocaine 5% applied to wound bed - in clinic, prior to debridement Bathing/ Shower/ Hygiene May shower with protection but do not get wound dressing(s) wet. - Keep dressing dry at all times until next appointment. Can purchase cast protector from [facility] or [facility] Edema Control - Lymphedema / SCD / Other Bilateral Lower Extremities Elevate legs to the level of the heart or above for 30 minutes daily and/or when sitting, a frequency of: Avoid standing for long periods of time. Patient to wear own compression stockings every day. Moisturize legs daily. Wound Treatment Wound #6 - Ankle Wound Laterality: Right, Lateral Cleanser: Soap and Water Discharge Instructions: May shower and wash wound with dial antibacterial soap and water prior to dressing change. Peri-Wound Care: Sween Lotion (Moisturizing lotion) Discharge Instructions: Apply moisturizing lotion as directed Prim Dressing: Promogran Prisma Matrix, 4.34 (sq in) (silver collagen) ary Discharge Instructions: Moisten collagen with saline or hydrogel Secondary Dressing: ABD Pad, 8x10 Discharge Instructions: Apply over primary dressing as directed. Secured With: Transpore Surgical Tape, 2x10 (in/yd) Discharge Instructions: Secure dressing with tape as directed. Compression Wrap: ThreePress (3 layer compression wrap) Discharge Instructions: Apply three layer compression as directed. Electronic Signature(s) Signed: 11/10/2022 4:40:59 PM By: Baltazar Najjar MD Signed: 11/10/2022 5:17:14 PM By: Tommie Ard RN Entered By: Tommie Ard on 11/10/2022 13:31:22 [name] ([id number]) [id number] Page 4 of 7 -------------------------------------------------------------------------------- Problem List Details Patient Name: Date of  Service: MEYLI, BOICE 11/10/2022 1:00 PM Medical Record Number: [id number] Patient Account Number: [id number] Date of Birth/Sex: Treating RN: 1946-07-24 (76 y.o. F) Primary Care Provider: Mayer Masker Other Clinician: Referring Provider: Treating Provider/Extender: Manning Charity in Treatment: 5 Active Problems ICD-10 Encounter  Code Description Active Date MDM Diagnosis L97.312 Non-pressure chronic ulcer of right ankle with fat layer exposed 10/05/2022 No Yes I87.2 Venous insufficiency (chronic) (peripheral) 10/05/2022 No Yes I50.32 Chronic diastolic (congestive) heart failure 10/05/2022 No Yes E66.09 Other obesity due to excess calories 10/05/2022 No Yes Inactive Problems Resolved Problems Electronic Signature(s) Signed: 11/10/2022 4:40:59 PM By: Baltazar Najjarobson, [place] Voytko MD Entered By: Baltazar Najjarobson, Urho Rio on 11/10/2022 13:35:11 -------------------------------------------------------------------------------- Progress Note Details Patient Name: Date of Service: [name] HaddockMCCRA CKEN, Shameca H. 11/10/2022 1:00 PM Medical Record Number: [id number] Patient Account Number: [id number] Date of Birth/Sex: Treating RN: 04-28-1946 (76 y.o. F) Primary Care Provider: [name], [name] Other Clinician: Referring Provider: Treating Provider/Extender: Manning Charityobson, Akilah Cureton [name], [name] Weeks in Treatment: 5 Subjective History of Present Illness (HPI) 07/09/18 on evaluation today patient presents for bilateral lower extremity ulcers. She has a past medical history significant for chronic venous stasis, hypertension, and lower extremity edema with associated skin changes. Unfortunately she tells me that roughly 3 weeks ago she noted ulcers on the left lower extremity which started given her trouble. Subsequently she ended up proceeding with applying mupirocin ointment and was keeping this open air for the most part. She states that it would scab over somewhat and then reopened. The wounds have  been training quite significantly. Fortunately there does not appear to be any evidence of overt infection there is some erythematous type skin changes but I believe this is likely stasis dermatitis nothing more. Fortunately she has no evidence of any systemic infection. No fevers, chills, nausea, or vomiting noted at this time. She has been recommended before to wear compression stockings but she does not wear them on a regular basis. She has never been suggested to get Juxta-Lite compression wraps she was not aware of what these were. Patient had venous studies performed on 11/09/17 which showed no lower surety DVT or deep vein insufficiency noted. She did not have evaluation however Mauricio PoMCCRACKEN, Angelize H ([id number]) [id number] Page 5 of 7 of the superficial veins. There was full compressibility noted throughout. Her arterial study which was performed on 11/18/17 actually showed that she has an ABI on the right of 1.17 with a TBI of 1.59 along with an ABI of 1.12 on the left and a TBI of 0.99. 07/17/18 on evaluation today patient appears to be doing much better in regard to her left lower Trinity ulcers. She's been tolerating the dressing changes without complication. There does not appear to be any evidence of infection which is good news. Overall I'm very pleased with the progress at this time. 07/24/18 on evaluation today patient's wound actually appears to be completely healed at this time. Fortunately there does not appear to be any evidence of infection which is good news. Overall I'm very happy with the progress that has been made. She did get her Velcro compression wraps. READMISSION 12/31/2020 This is a patient we had in this clinic in 2019 felt to have chronic venous insufficiency wounds on her lower extremities. She was discharged with bilateral compression stockings. She tells us that she never really wore them or at best wore them sparingly. She is back in clinic  today having a ulcer on her left lateral lower extremity since October and one on the right anterior for the last 2 weeks. I do not know that she is doing any primary dressing on these certainly not wearing any form of compression. She is already been evaluated by vein and vascular on 11/15/2020. They noted that she did have ulcers on the left leg  in October that healed with a need Unna boot. She had a reflux study on 12/20 that did not show evidence of a DVT or venous reflux bilaterally. They was not felt that she had arterial insufficiency based on the fact that she had brisk biphasic Doppler signals in the bilateral DP PT. The patient comes in today with superficial wounds on the right anterior mid tibia and the left lateral calf. These are small appear to have healthy surfaces. Past medical history includes cervical and upper thoracic spine surgery on 7/21, hernia repair early in January, hypertension, hyperlipidemia, hypothyroidism, gastric bypass surgery, DVT in the right peroneal artery and left popliteal and peroneal veins. Next problem CHF. She does not have diabetes ABIs in our clinic were 1.1 on the right and 1.2 on the lower 2/11; the patient's wound on the left lateral and right anterior lower leg are closed for edema control is good. She has an assortment of old juxta lite stockings with old liners. She does not like to wear anything in particular under the compression part of the juxta lite stocking. I think she is going to do exactly what she wants. READMISSION 10/05/2022 This is a 76 year old woman last seen in our clinic about 18 months ago. She has a history of venous stasis and stasis dermatitis. A reflux study done in 2021 did not show any evidence of venous reflux. She says about 3 weeks ago, she noticed an ulcer on her right lateral malleolus. She was applying triple antibiotic ointment to it for a while and then was soaking it in Epsom salts. She reports that she usually wears  compression stockings, but that they were rubbing so she did not wear 1 on her right leg since the wound appeared, she has a stocking with the toe and heel cut out applied to her left leg. On examination, she has changes of chronic stasis dermatitis on the right. There is a shallow ulcer overlying the lateral malleolus. The fat layer is exposed. It is a little bit desiccated but fairly clean with just some light eschar and slough present. No malodor or purulent drainage. 10/13/2022: The wound is a little smaller today. Although the moisture balance is better than last week, it is still a bit dry. There is still some slough accumulation on the surface. 10/25/2022: The wound continues to contract. There is still thick layer of slough on the surface. The moisture balance of the wound is better, but the periwound skin is slightly macerated. 11/02/2022: The wound is smaller and shallower today. There is still slough on the surface, but the periwound skin is intact. The wound surface is still drier than ideal. 12/15; wound is about the same size minimal depth. Under illumination some debris on the surface she has been using Prisma Objective Constitutional Patient is hypertensive.. Pulse regular and within target range for patient.Marland Kitchen Respirations regular, non-labored and within target range.. Temperature is normal and within the target range for the patient.Marland Kitchen Appears in no distress. Vitals Time Taken: 1:10 PM, Height: 65 in, Weight: 229 lbs, BMI: 38.1, Temperature: 97.6 F, Pulse: 69 bpm, Respiratory Rate: 16 breaths/min, Blood Pressure: 145/74 mmHg. General Notes: Wound exam; it appears that the wound is gotten shallower. I used a #3 curette to remove some fibrinous debris from the surface hemostasis with direct pressure. There is no evidence of surrounding infection in terms of tenderness although there is some erythema I suspect this is chronic rather than infectious. Her edema control is good and her  lower leg appears that she has lymphedema above the knee Integumentary (Hair, Skin) Wound #6 status is Open. Original cause of wound was Gradually Appeared. The date acquired was: 09/25/2022. The wound has been in treatment 5 weeks. The wound is located on the Right,Lateral Ankle. The wound measures 0.9cm length x 1cm width x 0.1cm depth; 0.707cm^2 area and 0.071cm^3 volume. There is no tunneling or undermining noted. There is a medium amount of serous drainage noted. The wound margin is flat and intact. There is medium (34-66%) pink granulation within the wound bed. There is a medium (34-66%) amount of necrotic tissue within the wound bed including Adherent Slough. The periwound skin appearance exhibited: Maceration, Erythema. The periwound skin appearance did not exhibit: Dry/Scaly. The surrounding wound skin color is noted with erythema which is circumferential. Periwound temperature was noted as No Abnormality. The periwound has tenderness on palpation. Assessment Active Problems ICD-10 KALICIA, DUFRESNE ([id number]) [id number] Page 6 of 7 Non-pressure chronic ulcer of right ankle with fat layer exposed Venous insufficiency (chronic) (peripheral) Chronic diastolic (congestive) heart failure Other obesity due to excess calories Procedures Wound #6 Pre-procedure diagnosis of Wound #6 is a Venous Leg Ulcer located on the Right,Lateral Ankle .Severity of Tissue Pre Debridement is: Fat layer exposed. There was a Selective/Open Wound Non-Viable Tissue Debridement with a total area of 0.9 sq cm performed by Maxwell Caul., MD. With the following instrument(s): Curette to remove Non-Viable tissue/material. Material removed includes [facility]. No specimens were taken. A time out was conducted at 13:27, prior to the start of the procedure. A Minimum amount of bleeding was controlled with Pressure. The procedure was tolerated well. Post Debridement Measurements: 0.9cm  length x 1cm width x 0.2cm depth; 0.141cm^3 volume. Character of Wound/Ulcer Post Debridement requires further debridement. Severity of Tissue Post Debridement is: Fat layer exposed. Post procedure Diagnosis Wound #6: Same as Pre-Procedure General Notes: Scribed for Dr. Leanord Hawking by Tommie Ard, RN. Pre-procedure diagnosis of Wound #6 is a Venous Leg Ulcer located on the Right,Lateral Ankle . There was a Three Layer Compression Therapy Procedure by Tommie Ard, RN. Post procedure Diagnosis Wound #6: Same as Pre-Procedure Plan Follow-up Appointments: Return Appointment in 1 week. - Dr. Lady Gary Room 1 Anesthetic: (In clinic) Topical Lidocaine 5% applied to wound bed - in clinic, prior to debridement Bathing/ Shower/ Hygiene: May shower with protection but do not get wound dressing(s) wet. - Keep dressing dry at all times until next appointment. Can purchase cast protector from [facility] or [facility] Edema Control - Lymphedema / SCD / Other: Elevate legs to the level of the heart or above for 30 minutes daily and/or when sitting, a frequency of: Avoid standing for long periods of time. Patient to wear own compression stockings every day. Moisturize legs daily. WOUND #6: - Ankle Wound Laterality: Right, Lateral Cleanser: Soap and Water Discharge Instructions: May shower and wash wound with dial antibacterial soap and water prior to dressing change. Peri-Wound Care: Sween Lotion (Moisturizing lotion) Discharge Instructions: Apply moisturizing lotion as directed Prim Dressing: Promogran Prisma Matrix, 4.34 (sq in) (silver collagen) ary Discharge Instructions: Moisten collagen with saline or hydrogel Secondary Dressing: ABD Pad, 8x10 Discharge Instructions: Apply over primary dressing as directed. Secured With: Transpore Surgical T ape, 2x10 (in/yd) Discharge Instructions: Secure dressing with tape as directed. Com pression Wrap: ThreePress (3 layer compression wrap) Discharge Instructions: Apply  three layer compression as directed. 1. No change to the primary dressing 2. Prisma and 3 layer compression 3. Fibrinous debris under illumination  which I removed hopefully this will allow some progression of epithelialization 4. No evidence of infection Electronic Signature(s) Signed: 11/10/2022 4:40:59 PM By: Baltazar Najjar MD Entered By: Baltazar Najjar on 11/10/2022 13:42:28 -------------------------------------------------------------------------------- SuperBill Details Patient Name: Date of Service: [name], Alaysiah H. 11/10/2022 Medical Record Number: [id number] Patient Account Number: [id number] Date of Birth/Sex: Treating RN: 12-06-1945 (76 y.o. Rhapsody Wolven, Ted H ([id number]) [id number] Page 7 of 7 Primary Care Provider: Mayer Masker Other Clinician: Referring Provider: Treating Provider/Extender: Manning Charity in Treatment: 5 Diagnosis Coding ICD-10 Codes Code Description [phone] Non-pressure chronic ulcer of right ankle with fat layer exposed I87.2 Venous insufficiency (chronic) (peripheral) I50.32 Chronic diastolic (congestive) heart failure E66.09 Other obesity due to excess calories Facility Procedures : 7 CPT4 Code: [id number] Description: 97597 - DEBRIDE WOUND 1ST 20 SQ CM OR < ICD-10 Diagnosis Description L97.312 Non-pressure chronic ulcer of right ankle with fat layer exposed Modifier: Quantity: 1 Physician Procedures : CPT4 Code Description Modifier [id number] 97597 - WC PHYS DEBR WO ANESTH 20 SQ CM ICD-10 Diagnosis Description L97.312 Non-pressure chronic ulcer of right ankle with fat layer exposed Quantity: 1 Electronic Signature(s) Signed: 11/10/2022 4:40:59 PM By: Baltazar Najjar MD Entered By: Baltazar Najjar on 11/10/2022 13:42:36

## 2022-11-10 NOTE — Progress Notes (Signed)
[name], [name] ([id number]) [id number] Page 1 of 8 Visit Report for 11/10/2022 Arrival Information Details Patient Name: Date of Service: [name] 11/10/2022 1:00 PM Medical Record Number: [id number] Patient Account Number: [id number] Date of Birth/Sex: Treating RN: Nov 30, 1945 (76 y.o. [name], [name] Primary Care Kieana Livesay: Mayer Masker Other Clinician: Referring Alfreda Hammad: Treating Jahaziel Francois/Extender: Manning Charity in Treatment: 5 Visit Information History Since Last Visit Added or deleted any medications: No Patient Arrived: [name] Any new allergies or adverse reactions: No Arrival Time: 13:15 Had a fall or experienced change in No Accompanied By: husband activities of daily living that may affect Transfer Assistance: None risk of falls: Patient Requires Transmission-Based Precautions: No Signs or symptoms of abuse/neglect since last visito No Patient Has Alerts: No Hospitalized since last visit: No Implantable device outside of the clinic excluding No cellular tissue based products placed in the center since last visit: Has Compression in Place as Prescribed: Yes Pain Present Now: Yes Electronic Signature(s) Signed: 11/10/2022 5:17:14 PM By: Tommie Ard RN Entered By: Tommie Ard on 11/10/2022 13:15:55 -------------------------------------------------------------------------------- Compression Therapy Details Patient Name: Date of Service: [name], [name] 11/10/2022 1:00 PM Medical Record Number: [id number] Patient Account Number: [id number] Date of Birth/Sex: Treating RN: Sep 28, 1946 (76 y.o. [name] Primary Care Siddh Vandeventer: Mayer Masker Other Clinician: Referring Nevena Rozenberg: Treating Pamalee Marcoe/Extender: Manning Charity in Treatment: 5 Compression Therapy Performed for Wound Assessment: Wound #6 Right,Lateral Ankle Performed By: Clinician Tommie Ard,  RN Compression Type: Three Layer Post Procedure Diagnosis Same as Pre-procedure Electronic Signature(s) Signed: 11/10/2022 5:17:14 PM By: Tommie Ard RN Entered By: Tommie Ard on 11/10/2022 13:30:21 [name] ([id number]) [id number] Page 2 of 8 -------------------------------------------------------------------------------- Encounter Discharge Information Details Patient Name: Date of Service: [name] 11/10/2022 1:00 PM Medical Record Number: [id number] Patient Account Number: [id number] Date of Birth/Sex: Treating RN: 21-Jan-1946 (76 y.o. [name] Primary Care Joeziah Voit: Mayer Masker Other Clinician: Referring Navjot Loera: Treating Wilman Tucker/Extender: Manning Charity in Treatment: 5 Encounter Discharge Information Items Post Procedure Vitals Discharge Condition: Stable Temperature (F): 97.6 Ambulatory Status: Cane Pulse (bpm): 69 Discharge Destination: Home Respiratory Rate (breaths/min): 16 Transportation: Private Auto Blood Pressure (mmHg): 145/74 Accompanied By: spouse Schedule Follow-up Appointment: Yes Clinical Summary of Care: Electronic Signature(s) Signed: 11/10/2022 5:17:14 PM By: Tommie Ard RN Entered By: Tommie Ard on 11/10/2022 13:32:35 -------------------------------------------------------------------------------- Lower Extremity Assessment Details Patient Name: Date of Service: [name] 11/10/2022 1:00 PM Medical Record Number: [id number] Patient Account Number: [id number] Date of Birth/Sex: Treating RN: 1946/03/30 (76 y.o. [name] Primary Care Amaad Byers: Mayer Masker Other Clinician: Referring Jaydenn Boccio: Treating Rosario Duey/Extender: Manning Charity in Treatment: 5 Edema Assessment Assessed: [Left: No] [Right: No] [Left: Edema] [Right: :] Calf Left: Right: Point of Measurement: From Medial Instep 32.5 cm Ankle Left:  Right: Point of Measurement: From Medial Instep 22 cm Vascular Assessment Pulses: Dorsalis Pedis Palpable: [Right:Yes] Electronic Signature(s) Signed: 11/10/2022 5:17:14 PM By: Tommie Ard RN Entered By: Tommie Ard on 11/10/2022 13:16:54 Multi Wound Chart Details -------------------------------------------------------------------------------- [name] ([id number]) [id number] Page 3 of 8 Patient Name: Date of Service: [name], [name] 11/10/2022 1:00 PM Medical Record Number: [id number] Patient Account Number: [id number] Date of Birth/Sex: Treating RN: 1945-12-13 (76 y.o. F) Primary Care Nicholson Starace: Mayer Masker Other Clinician: Referring Marvie Brevik: Treating Ayomide Purdy/Extender: Manning Charity in Treatment: 5 Vital Signs Height(in): 65 Pulse(bpm): 69 Weight(lbs): 229 Blood Pressure(mmHg): 145/74 Body Mass Index(BMI): 38.1 Temperature(F): 97.6  Respiratory Rate(breaths/min): 16 Wound Assessments Wound Number: 6 N/A N/A Photos: N/A N/A Right, Lateral Ankle N/A N/A Wound Location: Gradually Appeared N/A N/A Wounding Event: Venous Leg Ulcer N/A N/A Primary Etiology: Cataracts, Anemia, Lymphedema, N/A N/A Comorbid History: Congestive Heart Failure, Hypertension, Peripheral Venous Disease, Osteoarthritis, Neuropathy, Confinement Anxiety 09/25/2022 N/A N/A Date Acquired: 5 N/A N/A Weeks of Treatment: Open N/A N/A Wound Status: No N/A N/A Wound Recurrence: 0.9x1x0.1 N/A N/A Measurements L x W x D (cm) 0.707 N/A N/A A (cm) : rea 0.071 N/A N/A Volume (cm) : 31.80% N/A N/A % Reduction in A rea: 31.70% N/A N/A % Reduction in Volume: Full Thickness Without Exposed N/A N/A Classification: Support Structures Medium N/A N/A Exudate A mount: Serous N/A N/A Exudate Type: amber N/A N/A Exudate Color: Flat and Intact N/A N/A Wound Margin: Medium (34-66%) N/A N/A Granulation A mount: Pink N/A  N/A Granulation Quality: Medium (34-66%) N/A N/A Necrotic A mount: Small (1-33%) N/A N/A Epithelialization: Debridement - Selective/Open Wound N/A N/A Debridement: Pre-procedure Verification/Time Out 13:27 N/A N/A Taken: Slough N/A N/A Tissue Debrided: Non-Viable Tissue N/A N/A Level: 0.9 N/A N/A Debridement A (sq cm): rea Curette N/A N/A Instrument: Minimum N/A N/A Bleeding: Pressure N/A N/A Hemostasis A chieved: Procedure was tolerated well N/A N/A Debridement Treatment Response: 0.9x1x0.2 N/A N/A Post Debridement Measurements L x W x D (cm) 0.141 N/A N/A Post Debridement Volume: (cm) Maceration: Yes N/A N/A Periwound Skin Moisture: Dry/Scaly: No Erythema: Yes N/A N/A Periwound Skin Color: Circumferential N/A N/A Erythema Location: No Abnormality N/A N/A Temperature: Yes N/A N/A Tenderness on Palpation: Compression Therapy N/A N/A Procedures Performed: Debridement Treatment Notes Wound #6 (Ankle) Wound Laterality: Right, Lateral Cleanser Soap and Water DenverMCCRACKEN, Aarika H ([id number]) [id number] Page 4 of 8 Discharge Instruction: May shower and wash wound with dial antibacterial soap and water prior to dressing change. Peri-Wound Care Sween Lotion (Moisturizing lotion) Discharge Instruction: Apply moisturizing lotion as directed Topical Primary Dressing Promogran Prisma Matrix, 4.34 (sq in) (silver collagen) Discharge Instruction: Moisten collagen with saline or hydrogel Secondary Dressing ABD Pad, 8x10 Discharge Instruction: Apply over primary dressing as directed. Secured With Transpore Surgical Tape, 2x10 (in/yd) Discharge Instruction: Secure dressing with tape as directed. Compression Wrap ThreePress (3 layer compression wrap) Discharge Instruction: Apply three layer compression as directed. Compression Stockings Add-Ons Electronic Signature(s) Signed: 11/10/2022 4:40:59 PM By: Baltazar Najjarobson, Michael MD Entered By: Baltazar Najjarobson,  Michael on 11/10/2022 13:35:16 -------------------------------------------------------------------------------- Multi-Disciplinary Care Plan Details Patient Name: Date of Service: [name] HaddockMCCRA CKEN, [place] H. 11/10/2022 1:00 PM Medical Record Number: [id number] Patient Account Number: [id number] Date of Birth/Sex: Treating RN: 06-29-1946 (76 y.o. [name]) Zochol, [name] Primary Care Bridie Colquhoun: [name], [name] Other Clinician: Referring Javious Hallisey: Treating Nicholous Girgenti/Extender: Manning Charityobson, Michael [name], [name] Weeks in Treatment: 5 Active Inactive Nutrition Nursing Diagnoses: Potential for alteratiion in Nutrition/Potential for imbalanced nutrition Goals: Patient/caregiver agrees to and verbalizes understanding of need to use nutritional supplements and/or vitamins as prescribed Date Initiated: 10/13/2022 Target Resolution Date: 11/24/2022 Goal Status: Active Interventions: Provide education on nutrition Treatment Activities: Dietary management education, guidance and counseling : 10/13/2022 Education provided on Nutrition : 10/25/2022 Notes: Wound/Skin Impairment Nursing Diagnoses: [name] BarriosMCCRACKEN, Raini H ([id number]) [id number] Page 5 of 8 Impaired tissue integrity Goals: Ulcer/skin breakdown will have a volume reduction of 30% by week 4 Date Initiated: 10/13/2022 Target Resolution Date: 11/24/2022 Goal Status: Active Interventions: Provide education on ulcer and skin care Notes: Electronic Signature(s) Signed: 11/10/2022 5:17:14 PM By: Tommie ArdZochol, [name] RN Entered By: Tommie ArdZochol, [name] on 11/10/2022 13:31:31 -------------------------------------------------------------------------------- Pain Assessment  Details Patient Name: Date of Service: RACHELANN, ENLOE 11/10/2022 1:00 PM Medical Record Number: [id number] Patient Account Number: [id number] Date of Birth/Sex: Treating RN: 07/09/1946 (76 y.o. [name] Primary Care Akari Defelice: Mayer Masker Other  Clinician: Referring Audel Coakley: Treating Neville Pauls/Extender: Manning Charity in Treatment: 5 Active Problems Location of Pain Severity and Description of Pain Patient Has Paino Yes Site Locations Rate the pain. Current Pain Level: 1 Pain Management and Medication Current Pain Management: Electronic Signature(s) Signed: 11/10/2022 5:17:14 PM By: Tommie Ard RN Entered By: Tommie Ard on 11/10/2022 13:16:22 -------------------------------------------------------------------------------- Patient/Caregiver Education Details Patient Name: Date of Service: [name], [name] 12/15/2023andnbsp1:00 PM Clarence, [name] ([id number]) [id number] Page 6 of 8 Medical Record Number: [id number] Patient Account Number: [id number] Date of Birth/Gender: Treating RN: [birth year] (76 y.o. [name] Primary Care Physician: Mayer Masker Other Clinician: Referring Physician: Treating Physician/Extender: Manning Charity in Treatment: 5 Education Assessment Education Provided To: Patient Education Topics Provided Nutrition: Methods: Explain/Verbal Responses: Reinforcements needed, State content correctly Wound/Skin Impairment: Methods: Explain/Verbal Responses: Reinforcements needed, State content correctly Electronic Signature(s) Signed: 11/10/2022 5:17:14 PM By: Tommie Ard RN Entered By: Tommie Ard on 11/10/2022 13:31:48 -------------------------------------------------------------------------------- Wound Assessment Details Patient Name: Date of Service: [name], Dyanna H. 11/10/2022 1:00 PM Medical Record Number: [id number] Patient Account Number: [id number] Date of Birth/Sex: Treating RN: 1946/03/01 (76 y.o. [name], [name] Primary Care Merlinda Wrubel: Mayer Masker Other Clinician: Referring Kemora Pinard: Treating Janalee Grobe/Extender: Manning Charity in Treatment: 5 Wound  Status Wound Number: 6 Primary Venous Leg Ulcer Etiology: Wound Location: Right, Lateral Ankle Wound Open Wounding Event: Gradually Appeared Status: Date Acquired: 09/25/2022 Comorbid Cataracts, Anemia, Lymphedema, Congestive Heart Failure, Weeks Of Treatment: 5 History: Hypertension, Peripheral Venous Disease, Osteoarthritis, Clustered Wound: No Neuropathy, Confinement Anxiety Photos Wound Measurements Length: (cm) 0.9 Width: (cm) 1 Depth: (cm) 0.1 Area: (cm) 0.707 Volume: (cm) 0.071 % Reduction in Area: 31.8% % Reduction in Volume: 31.7% Epithelialization: Small (1-33%) Tunneling: No Undermining: No Wound Description Simon, Maragret H ([id number]) Classification: Full Thickness Without Exposed Support Structures Wound Margin: Flat and Intact Exudate Amount: Medium Exudate Type: Serous Exudate Color: amber [id number] Page 7 of 8 Foul Odor After Cleansing: No Slough/Fibrino Yes Wound Bed Granulation Amount: Medium (34-66%) Granulation Quality: Pink Necrotic Amount: Medium (34-66%) Necrotic Quality: Adherent Slough Periwound Skin Texture Texture Color No Abnormalities Noted: No No Abnormalities Noted: No Erythema: Yes Moisture Erythema Location: Circumferential No Abnormalities Noted: No Dry / Scaly: No Temperature / Pain Maceration: Yes Temperature: No Abnormality Tenderness on Palpation: Yes Treatment Notes Wound #6 (Ankle) Wound Laterality: Right, Lateral Cleanser Soap and Water Discharge Instruction: May shower and wash wound with dial antibacterial soap and water prior to dressing change. Peri-Wound Care Sween Lotion (Moisturizing lotion) Discharge Instruction: Apply moisturizing lotion as directed Topical Primary Dressing Promogran Prisma Matrix, 4.34 (sq in) (silver collagen) Discharge Instruction: Moisten collagen with saline or hydrogel Secondary Dressing ABD Pad, 8x10 Discharge Instruction: Apply over primary  dressing as directed. Secured With Transpore Surgical Tape, 2x10 (in/yd) Discharge Instruction: Secure dressing with tape as directed. Compression Wrap ThreePress (3 layer compression wrap) Discharge Instruction: Apply three layer compression as directed. Compression Stockings Add-Ons Electronic Signature(s) Signed: 11/10/2022 5:17:14 PM By: Tommie Ard RN Entered By: Tommie Ard on 11/10/2022 13:19:52 -------------------------------------------------------------------------------- Vitals Details Patient Name: Date of Service: [name], Yassmin H. 11/10/2022 1:00 PM Medical Record Number: [id number] Patient Account Number: [id number] Date of Birth/Sex: Treating RN: 1946/01/30 (76 y.o. F) Zochol, [name] Primary  Care Joby Hershkowitz: Mayer Masker Other Clinician: Referring Shavonta Gossen: Treating Aysel Gilchrest/Extender: Dene Gentry Raymond, [place] H ([id number]) [id number] Page 8 of 8 Weeks in Treatment: 5 Vital Signs Time Taken: 13:10 Temperature (F): 97.6 Height (in): 65 Pulse (bpm): 69 Weight (lbs): 229 Respiratory Rate (breaths/min): 16 Body Mass Index (BMI): 38.1 Blood Pressure (mmHg): 145/74 Reference Range: 80 - 120 mg / dl Electronic Signature(s) Signed: 11/10/2022 5:17:14 PM By: Tommie Ard RN Entered By: Tommie Ard on 11/10/2022 13:16:14

## 2022-11-14 ENCOUNTER — Other Ambulatory Visit: Payer: Self-pay | Admitting: Physician Assistant

## 2022-11-14 ENCOUNTER — Other Ambulatory Visit: Payer: Self-pay | Admitting: Family Medicine

## 2022-11-14 DIAGNOSIS — Z1231 Encounter for screening mammogram for malignant neoplasm of breast: Secondary | ICD-10-CM

## 2022-11-16 ENCOUNTER — Encounter (HOSPITAL_BASED_OUTPATIENT_CLINIC_OR_DEPARTMENT_OTHER): Payer: PPO | Admitting: General Surgery

## 2022-11-16 DIAGNOSIS — L97312 Non-pressure chronic ulcer of right ankle with fat layer exposed: Secondary | ICD-10-CM | POA: Diagnosis not present

## 2022-11-16 DIAGNOSIS — I872 Venous insufficiency (chronic) (peripheral): Secondary | ICD-10-CM | POA: Diagnosis not present

## 2022-11-22 ENCOUNTER — Other Ambulatory Visit: Payer: Self-pay | Admitting: Nurse Practitioner

## 2022-11-22 NOTE — Progress Notes (Signed)
CLEMA, SKOUSEN ([id number]) [id number] Page 1 of 8 Visit Report for 11/16/2022 Arrival Information Details Patient Name: Date of Service: [name], [name] 11/16/2022 2:45 PM Medical Record Number: [id number] Patient Account Number: [id number] Date of Birth/Sex: Treating RN: May 19, 1946 (76 y.o. [name] Primary Care [name]: Driscilla Grammes, MA RITZA Other Clinician: Referring [name]: Treating [name]/Extender: [name] Skains, MA RITZA Weeks in Treatment: 6 Visit Information History Since Last Visit Added or deleted any medications: No Patient Arrived: Cane Any [name] allergies or adverse reactions: No Arrival Time: 14:57 Had a fall or experienced change in No Accompanied By: husband activities of daily living that may affect Transfer Assistance: None risk of falls: Patient Identification Verified: Yes Signs or symptoms of abuse/neglect since last visito No Secondary Verification Process Completed: Yes Hospitalized since last visit: No Patient Requires Transmission-Based Precautions: No Implantable device outside of the clinic excluding No Patient Has Alerts: No cellular tissue based products placed in the center since last visit: Has Dressing in Place as Prescribed: Yes Has Compression in Place as Prescribed: Yes Pain Present Now: No Electronic Signature(s) Signed: 11/22/2022 12:22:10 PM By: Redmond Pulling RN, BSN Entered By: Redmond Pulling on 11/16/2022 15:46:09 -------------------------------------------------------------------------------- Compression Therapy Details Patient Name: Date of Service: [name], [name] Bray [name]. 11/16/2022 2:45 PM Medical Record Number: [id number] Patient Account Number: [id number] Date of Birth/Sex: Treating RN: 1946/02/22 (76 y.o. [name] Primary Care Travarius Lange: Driscilla Grammes, MA RITZA Other Clinician: Referring Yechezkel Fertig: Treating Serenity Batley/Extender: [name] Skains, MA RITZA Weeks  in Treatment: 6 Compression Therapy Performed for Wound Assessment: Wound #6 Right,Lateral Ankle Performed By: Clinician Redmond Pulling, RN Compression Type: Three Layer Post Procedure Diagnosis Same as Pre-procedure Electronic Signature(s) Signed: 11/22/2022 12:22:10 PM By: Redmond Pulling RN, BSN Entered By: Redmond Pulling on 11/16/2022 15:25:09 [name] ([id number]) [id number] Page 2 of 8 -------------------------------------------------------------------------------- Encounter Discharge Information Details Patient Name: Date of Service: [name], [name] 11/16/2022 2:45 PM Medical Record Number: [id number] Patient Account Number: [id number] Date of Birth/Sex: Treating RN: 03-21-1946 (76 y.o. [name] Primary Care Millie Forde: Driscilla Grammes, MA RITZA Other Clinician: Referring Myya Meenach: Treating Kandice Schmelter/Extender: [name] Skains, MA RITZA Weeks in Treatment: 6 Encounter Discharge Information Items Post Procedure Vitals Discharge Condition: Stable Temperature (F): 97.8 Ambulatory Status: Cane Pulse (bpm): 67 Discharge Destination: Home Respiratory Rate (breaths/min): 18 Transportation: Private Auto Blood Pressure (mmHg): 155/80 Accompanied By: husband Schedule Follow-up Appointment: Yes Clinical Summary of Care: Patient Declined Electronic Signature(s) Signed: 11/22/2022 12:22:10 PM By: Redmond Pulling RN, BSN Entered By: Redmond Pulling on 11/16/2022 15:45:54 -------------------------------------------------------------------------------- Lower Extremity Assessment Details Patient Name: Date of Service: [name] [facility], [name] Bray [name]. 11/16/2022 2:45 PM Medical Record Number: [id number] Patient Account Number: [id number] Date of Birth/Sex: Treating RN: 1946-02-13 (76 y.o. [name] Primary Care Janari Gagner: Driscilla Grammes, MA RITZA Other Clinician: Referring Cariann Kinnamon: Treating Aijah Lattner/Extender: [name] Skains, MA  RITZA Weeks in Treatment: 6 Edema Assessment Assessed: [Left: No] [Right: No] [Left: Edema] [Right: :] Calf Left: Right: Point of Measurement: From Medial Instep 35 cm Ankle Left: Right: Point of Measurement: From Medial Instep 22 cm Vascular Assessment Pulses: Dorsalis Pedis Palpable: [Right:Yes] Electronic Signature(s) Signed: 11/22/2022 12:22:10 PM By: Redmond Pulling RN, BSN Entered By: Redmond Pulling on 11/16/2022 15:05:52 Multi Wound Chart Details -------------------------------------------------------------------------------- [name] ([id number]) [id number] Page 3 of 8 Patient Name: Date of Service: [name], [name] 11/16/2022 2:45 PM Medical Record Number: [id number] Patient Account Number:  [id number] Date of Birth/Sex: Treating RN: 1946-08-09 (76 y.o. F) Primary Care Oneida Mckamey: Driscilla Grammes, MA RITZA Other Clinician: Referring Tamikia Chowning: Treating Irby Fails/Extender: [name] Skains, MA RITZA Weeks in Treatment: 6 Vital Signs Height(in): 65 Pulse(bpm): 67 Weight(lbs): 229 Blood Pressure(mmHg): 155/80 Body Mass Index(BMI): 38.1 Temperature(F): 97.8 Respiratory Rate(breaths/min): 18 Wound Assessments Wound Number: 6 N/A N/A Photos: N/A N/A Right, Lateral Ankle N/A N/A Wound Location: Gradually Appeared N/A N/A Wounding Event: Venous Leg Ulcer N/A N/A Primary Etiology: Cataracts, Anemia, Lymphedema, N/A N/A Comorbid History: Congestive Heart Failure, Hypertension, Peripheral Venous Disease, Osteoarthritis, Neuropathy, Confinement Anxiety 09/25/2022 N/A N/A Date Acquired: 6 N/A N/A Weeks of Treatment: Open N/A N/A Wound Status: No N/A N/A Wound Recurrence: 1x1x0.1 N/A N/A Measurements L x W x D (cm) 0.785 N/A N/A A (cm) : rea 0.079 N/A N/A Volume (cm) : 24.30% N/A N/A % Reduction in A rea: 24.00% N/A N/A % Reduction in Volume: Full Thickness Without Exposed N/A N/A Classification: Support  Structures Medium N/A N/A Exudate A mount: Serous N/A N/A Exudate Type: amber N/A N/A Exudate Color: Flat and Intact N/A N/A Wound Margin: Medium (34-66%) N/A N/A Granulation A mount: Pink N/A N/A Granulation Quality: Medium (34-66%) N/A N/A Necrotic A mount: Small (1-33%) N/A N/A Epithelialization: Debridement - Excisional N/A N/A Debridement: Pre-procedure Verification/Time Out 15:20 N/A N/A Taken: Lidocaine 5% topical ointment N/A N/A Pain Control: Subcutaneous, Slough N/A N/A Tissue Debrided: Skin/Subcutaneous Tissue N/A N/A Level: 1 N/A N/A Debridement A (sq cm): rea Curette N/A N/A Instrument: Minimum N/A N/A Bleeding: Pressure N/A N/A Hemostasis A chieved: 0 N/A N/A Procedural Pain: 0 N/A N/A Post Procedural Pain: Procedure was tolerated well N/A N/A Debridement Treatment Response: 1x1x0.1 N/A N/A Post Debridement Measurements L x W x D (cm) 0.079 N/A N/A Post Debridement Volume: (cm) Maceration: Yes N/A N/A Periwound Skin Moisture: Dry/Scaly: No Erythema: Yes N/A N/A Periwound Skin Color: Circumferential N/A N/A Erythema Location: No Abnormality N/A N/A Temperature: Yes N/A N/A Tenderness on Palpation: Compression Therapy N/A N/A Procedures Performed: Debridement Treatment Notes Wound #6 (Ankle) Wound Laterality: Right, Lateral Alyea, Verenice [name] ([id number]) [id number] Page 4 of 8 Cleanser Soap and Water Discharge Instruction: May shower and wash wound with dial antibacterial soap and water prior to dressing change. Peri-Wound Care Sween Lotion (Moisturizing lotion) Discharge Instruction: Apply moisturizing lotion as directed Topical Primary Dressing Endoform 2x2 in Discharge Instruction: Moisten with saline Secondary Dressing ABD Pad, 8x10 Discharge Instruction: Apply over primary dressing as directed. Secured With Transpore Surgical Tape, 2x10 (in/yd) Discharge Instruction: Secure dressing with tape  as directed. Compression Wrap ThreePress (3 layer compression wrap) Discharge Instruction: Apply three layer compression as directed. Compression Stockings Add-Ons Electronic Signature(s) Signed: 11/16/2022 4:00:19 PM By: Duanne Guess MD FACS Entered By: Duanne Guess on 11/16/2022 16:00:19 -------------------------------------------------------------------------------- Multi-Disciplinary Care Plan Details Patient Name: Date of Service: [name], [name] [place] [name]. 11/16/2022 2:45 PM Medical Record Number: [id number] Patient Account Number: [id number] Date of Birth/Sex: Treating RN: 03/29/1946 (76 y.o. [name] Primary Care Tonia Avino: Driscilla Grammes, MA RITZA Other Clinician: Referring Leala Bryand: Treating Charletta Voight/Extender: [name] Skains, MA RITZA Weeks in Treatment: 6 Active Inactive Nutrition Nursing Diagnoses: Potential for alteratiion in Nutrition/Potential for imbalanced nutrition Goals: Patient/caregiver agrees to and verbalizes understanding of need to use nutritional supplements and/or vitamins as prescribed Date Initiated: 10/13/2022 Target Resolution Date: 11/24/2022 Goal Status: Active Interventions: Provide education on nutrition Treatment Activities: Dietary management education, guidance and counseling : 10/13/2022 Education provided on Nutrition :  11/10/2022 NotesJONNA, [name] ([id number]) [phone].pdf Page 5 of 8 Wound/Skin Impairment Nursing Diagnoses: Impaired tissue integrity Goals: Ulcer/skin breakdown will have a volume reduction of 30% by week 4 Date Initiated: 10/13/2022 Target Resolution Date: 11/24/2022 Goal Status: Active Interventions: Provide education on ulcer and skin care Notes: Electronic Signature(s) Signed: 11/22/2022 12:22:10 PM By: Redmond Pulling RN, BSN Entered By: Redmond Pulling on 11/16/2022 15:25:21 -------------------------------------------------------------------------------- Pain  Assessment Details Patient Name: Date of Service: [name], [name] Bray [name]. 11/16/2022 2:45 PM Medical Record Number: [id number] Patient Account Number: [id number] Date of Birth/Sex: Treating RN: 14-May-1946 (76 y.o. [name] Primary Care Donovin Kraemer: Driscilla Grammes, MA RITZA Other Clinician: Referring Arslan Kier: Treating Strummer Canipe/Extender: [name] Skains, MA RITZA Weeks in Treatment: 6 Active Problems Location of Pain Severity and Description of Pain Patient Has Paino No Site Locations Pain Management and Medication Current Pain Management: Electronic Signature(s) Signed: 11/22/2022 12:22:10 PM By: Redmond Pulling RN, BSN Entered By: Redmond Pulling on 11/16/2022 14:58:25 [name], [name] ([id number]) [id number] Page 6 of 8 -------------------------------------------------------------------------------- Patient/Caregiver Education Details Patient Name: Date of Service: [name], [name] 12/21/2023andnbsp2:45 PM Medical Record Number: [id number] Patient Account Number: [id number] Date of Birth/Gender: Treating RN: 06/09/1946 (76 y.o. [name] Primary Care Physician: Driscilla Grammes, MA RITZA Other Clinician: Referring Physician: Treating Physician/Extender: [name] Skains, MA RITZA Weeks in Treatment: 6 Education Assessment Education Provided To: Patient Education Topics Provided Wound/Skin Impairment: Methods: Explain/Verbal Responses: State content correctly Electronic Signature(s) Signed: 11/22/2022 12:22:10 PM By: Redmond Pulling RN, BSN Entered By: Redmond Pulling on 11/16/2022 15:25:38 -------------------------------------------------------------------------------- Wound Assessment Details Patient Name: Date of Service: [name], [name] Bray [name]. 11/16/2022 2:45 PM Medical Record Number: [id number] Patient Account Number: [id number] Date of Birth/Sex: Treating RN: 04-22-1946 (76 y.o. [name] Primary Care Pollyann Roa: Driscilla Grammes, MA RITZA Other Clinician: Referring Kaytlynne Neace: Treating Caster Fayette/Extender: [name] Skains, MA RITZA Weeks in Treatment: 6 Wound Status Wound Number: 6 Primary Venous Leg Ulcer Etiology: Wound Location: Right, Lateral Ankle Wound Open Wounding Event: Gradually Appeared Status: Date Acquired: 09/25/2022 Comorbid Cataracts, Anemia, Lymphedema, Congestive Heart Failure, Weeks Of Treatment: 6 History: Hypertension, Peripheral Venous Disease, Osteoarthritis, Clustered Wound: No Neuropathy, Confinement Anxiety Photos Wound Measurements Length: (cm) 1 Width: (cm) 1 Depth: (cm) 0.1 Area: (cm) 0.785 Volume: (cm) 0.079 % Reduction in Area: 24.3% % Reduction in Volume: 24% Epithelialization: Small (1-33%) Tunneling: No Undermining: No Wound Description [name], [name] ([id number]) Classification: Full Thickness Without Exposed Support Structures Wound Margin: Flat and Intact Exudate Amount: Medium Exudate Type: Serous Exudate Color: amber [id number] Page 7 of 8 Foul Odor After Cleansing: No Slough/Fibrino Yes Wound Bed Granulation Amount: Medium (34-66%) Granulation Quality: Pink Necrotic Amount: Medium (34-66%) Necrotic Quality: Adherent Slough Periwound Skin Texture Texture Color No Abnormalities Noted: No No Abnormalities Noted: No Erythema: Yes Moisture Erythema Location: Circumferential No Abnormalities Noted: No Dry / Scaly: No Temperature / Pain Maceration: Yes Temperature: No Abnormality Tenderness on Palpation: Yes Treatment Notes Wound #6 (Ankle) Wound Laterality: Right, Lateral Cleanser Soap and Water Discharge Instruction: May shower and wash wound with dial antibacterial soap and water prior to dressing change. Peri-Wound Care Sween Lotion (Moisturizing lotion) Discharge Instruction: Apply moisturizing lotion as directed Topical Primary Dressing Endoform 2x2 in Discharge Instruction: Moisten with  saline Secondary Dressing ABD Pad, 8x10 Discharge Instruction: Apply over primary dressing as directed. Secured With Transpore Surgical Tape, 2x10 (in/yd) Discharge Instruction: Secure dressing with tape as directed. Compression Wrap ThreePress (3 layer  compression wrap) Discharge Instruction: Apply three layer compression as directed. Compression Stockings Add-Ons Electronic Signature(s) Signed: 11/22/2022 12:22:10 PM By: Redmond PullingPalmer, Carrie RN, BSN Entered By: Redmond PullingPalmer, Carrie on 11/16/2022 15:09:52 -------------------------------------------------------------------------------- Vitals Details Patient Name: Date of Service: [name], [name]. 11/16/2022 2:45 PM Medical Record Number: [id number] Patient Account Number: [id number] Date of Birth/Sex: Treating RN: September 23, 1946 (76 y.o. [name] GovernF) Palmer, Carrie Primary Care Phylicia Mcgaugh: Driscilla GrammesA BO NZA, MA RITZA Other Clinician: Referring Kasandra Fehr: Treating Alaycia Eardley/Extender: [name] Skainsannon, Jennifer A BO NZA, MA RITZA Weeks in Treatment: 6 Smigel, Elan [name] ([id number]) [id number] Page 8 of 8 Vital Signs Time Taken: 15:08 Temperature (F): 97.8 Height (in): 65 Pulse (bpm): 67 Weight (lbs): 229 Respiratory Rate (breaths/min): 18 Body Mass Index (BMI): 38.1 Blood Pressure (mmHg): 155/80 Reference Range: 80 - 120 mg / dl Electronic Signature(s) Signed: 11/22/2022 12:22:10 PM By: Redmond PullingPalmer, Carrie RN, BSN Entered By: Redmond PullingPalmer, Carrie on 11/16/2022 08:65:7815:08:28

## 2022-11-22 NOTE — Progress Notes (Signed)
[name], [name] ([id number]) [id number] Page 1 of 10 Visit Report for 11/16/2022 Chief Complaint Document Details Patient Name: Date of Service: [name], [name] 11/16/2022 2:45 PM Medical Record Number: [id number] Patient Account Number: [id number] Date of Birth/Sex: Treating RN: Oct 20, 1946 (76 y.o. F) Primary Care Provider: Driscilla Grammes, MA RITZA Other Clinician: Referring Provider: Treating Provider/Extender: Hulen Skains, MA RITZA Weeks in Treatment: 6 Information Obtained from: Patient Chief Complaint Bilateral LE Ulcers 10/05/2022: RLE ulcer Electronic Signature(s) Signed: 11/16/2022 4:00:41 PM By: Duanne Guess MD FACS Entered By: Duanne Guess on 11/16/2022 16:00:41 -------------------------------------------------------------------------------- Debridement Details Patient Name: Date of Service: [name], Stark Bray [name]. 11/16/2022 2:45 PM Medical Record Number: [id number] Patient Account Number: [id number] Date of Birth/Sex: Treating RN: 09-04-1946 (76 y.o. Orville Govern Primary Care Provider: Driscilla Grammes, MA RITZA Other Clinician: Referring Provider: Treating Provider/Extender: Hulen Skains, MA RITZA Weeks in Treatment: 6 Debridement Performed for Assessment: Wound #6 Right,Lateral Ankle Performed By: Physician Duanne Guess, MD Debridement Type: Debridement Severity of Tissue Pre Debridement: Fat layer exposed Level of Consciousness (Pre-procedure): Awake and Alert Pre-procedure Verification/Time Out Yes - 15:20 Taken: Start Time: 15:20 Pain Control: Lidocaine 5% topical ointment T Area Debrided (L x W): otal 1 (cm) x 1 (cm) = 1 (cm) Tissue and other material debrided: Non-Viable, Slough, Subcutaneous, Slough Level: Skin/Subcutaneous Tissue Debridement Description: Excisional Instrument: Curette Bleeding: Minimum Hemostasis Achieved: Pressure Procedural Pain: 0 Post Procedural Pain: 0 Response to  Treatment: Procedure was tolerated well Level of Consciousness (Post- Awake and Alert procedure): Post Debridement Measurements of Total Wound Length: (cm) 1 Width: (cm) 1 Depth: (cm) 0.1 Volume: (cm) 0.079 Character of Wound/Ulcer Post Debridement: Improved Band, [name] ([id number]) [id number] Page 2 of 10 Severity of Tissue Post Debridement: Fat layer exposed Post Procedure Diagnosis Same as Pre-procedure Notes scribed for Dr. Lady Gary by Redmond Pulling, RN Electronic Signature(s) Signed: 11/16/2022 4:37:08 PM By: Duanne Guess MD FACS Signed: 11/22/2022 12:22:10 PM By: Redmond Pulling RN, BSN Entered By: Redmond Pulling on 11/16/2022 15:22:24 -------------------------------------------------------------------------------- HPI Details Patient Name: Date of Service: [name], [name]. 11/16/2022 2:45 PM Medical Record Number: [id number] Patient Account Number: [id number] Date of Birth/Sex: Treating RN: 1945-12-11 (76 y.o. F) Primary Care Provider: Driscilla Grammes, MA RITZA Other Clinician: Referring Provider: Treating Provider/Extender: Hulen Skains, MA RITZA Weeks in Treatment: 6 History of Present Illness HPI Description: 07/09/18 on evaluation today patient presents for bilateral lower extremity ulcers. She has a past medical history significant for chronic venous stasis, hypertension, and lower extremity edema with associated skin changes. Unfortunately she tells me that roughly 3 weeks ago she noted ulcers on the left lower extremity which started given her trouble. Subsequently she ended up proceeding with applying mupirocin ointment and was keeping this open air for the most part. She states that it would scab over somewhat and then reopened. The wounds have been training quite significantly. Fortunately there does not appear to be any evidence of overt infection there is some erythematous type skin changes but I believe this is  likely stasis dermatitis nothing more. Fortunately she has no evidence of any systemic infection. No fevers, chills, nausea, or vomiting noted at this time. She has been recommended before to wear compression stockings but she does not wear them on a regular basis. She has never been suggested to get Juxta-Lite compression wraps she was not aware of what these were. Patient had venous studies performed on 11/09/17 which showed no  lower surety DVT or deep vein insufficiency noted. She did not have evaluation however of the superficial veins. There was full compressibility noted throughout. Her arterial study which was performed on 11/18/17 actually showed that she has an ABI on the right of 1.17 with a TBI of 1.59 along with an ABI of 1.12 on the left and a TBI of 0.99. 07/17/18 on evaluation today patient appears to be doing much better in regard to her left lower Trinity ulcers. She's been tolerating the dressing changes without complication. There does not appear to be any evidence of infection which is good news. Overall I'm very pleased with the progress at this time. 07/24/18 on evaluation today patient's wound actually appears to be completely healed at this time. Fortunately there does not appear to be any evidence of infection which is good news. Overall I'm very happy with the progress that has been made. She did get her Velcro compression wraps. READMISSION 12/31/2020 This is a patient we had in this clinic in 2019 felt to have chronic venous insufficiency wounds on her lower extremities. She was discharged with bilateral compression stockings. She tells [place] that she never really wore them or at best wore them sparingly. She is back in clinic today having a ulcer on her left lateral lower extremity since October and one on the right anterior for the last 2 weeks. I do not know that she is doing any primary dressing on these certainly not wearing any form of compression. She is already been  evaluated by vein and vascular on 11/15/2020. They noted that she did have ulcers on the left leg in October that healed with a need Unna boot. She had a reflux study on 12/20 that did not show evidence of a DVT or venous reflux bilaterally. They was not felt that she had arterial insufficiency based on the fact that she had brisk biphasic Doppler signals in the bilateral DP PT. The patient comes in today with superficial wounds on the right anterior mid tibia and the left lateral calf. These are small appear to have healthy surfaces. Past medical history includes cervical and upper thoracic spine surgery on 7/21, hernia repair early in January, hypertension, hyperlipidemia, hypothyroidism, gastric bypass surgery, DVT in the right peroneal artery and left popliteal and peroneal veins. Next problem CHF. She does not have diabetes ABIs in our clinic were 1.1 on the right and 1.2 on the lower 2/11; the patient's wound on the left lateral and right anterior lower leg are closed for edema control is good. She has an assortment of old juxta lite stockings with old liners. She does not like to wear anything in particular under the compression part of the juxta lite stocking. I think she is going to do exactly what she wants. READMISSION 10/05/2022 This is a 76 year old woman last seen in our clinic about 18 months ago. She has a history of venous stasis and stasis dermatitis. A reflux study done in 2021 did not show any evidence of venous reflux. She says about 3 weeks ago, she noticed an ulcer on her right lateral malleolus. She was applying triple antibiotic ointment to it for a while and then was soaking it in Epsom salts. She reports that she usually wears compression stockings, but that they were rubbing so she did not wear 1 on her right leg since the wound appeared, she has a stocking with the toe and heel cut out applied to her left leg. On examination, she has changes of  chronic stasis dermatitis on  the right. There is a shallow ulcer overlying the lateral malleolus. The fat layer is exposed. It is a little bit desiccated but fairly clean with just some light eschar and slough present. No malodor or purulent drainage. Johnnette BarriosMCCRACKEN, Marthella [name] ([id number]) [id number] Page 3 of 10 10/13/2022: The wound is a little smaller today. Although the moisture balance is better than last week, it is still a bit dry. There is still some slough accumulation on the surface. 10/25/2022: The wound continues to contract. There is still thick layer of slough on the surface. The moisture balance of the wound is better, but the periwound skin is slightly macerated. 11/02/2022: The wound is smaller and shallower today. There is still slough on the surface, but the periwound skin is intact. The wound surface is still drier than ideal. 12/15; wound is about the same size minimal depth. Under illumination some debris on the surface she has been using Prisma 11/16/2022: The leg wrap became saturated when her makeshift cast protector leaked. The wound is a little bit larger today, as result of moisture-related tissue breakdown. The surface remains fairly fibrotic. Electronic Signature(s) Signed: 11/16/2022 4:01:25 PM By: Duanne Guessannon, Rahshawn Remo MD FACS Entered By: Duanne Guessannon, Stephenie Navejas on 11/16/2022 16:01:25 -------------------------------------------------------------------------------- Physical Exam Details Patient Name: Date of Service: [name] HaddockMCCRA CKEN, Randall AnLYNDA [name]. 11/16/2022 2:45 PM Medical Record Number: [id number] Patient Account Number: [id number] Date of Birth/Sex: Treating RN: 17-Oct-1946 (76 y.o. F) Primary Care Provider: Driscilla GrammesA BO NZA, MA RITZA Other Clinician: Referring Provider: Treating Provider/Extender: Hulen Skainsannon, Mckenlee Mangham A BO NZA, MA RITZA Weeks in Treatment: 6 Constitutional She is hypertensive, but asymptomatic.. . . . No acute distress. Respiratory Normal work of breathing on room  air. Notes 11/16/2022: The wound is a little bit larger today, as result of moisture-related tissue breakdown. The surface remains fairly fibrotic. Electronic Signature(s) Signed: 11/16/2022 4:02:16 PM By: Duanne Guessannon, Shevawn Langenberg MD FACS Entered By: Duanne Guessannon, Erianna Jolly on 11/16/2022 16:02:16 -------------------------------------------------------------------------------- Physician Orders Details Patient Name: Date of Service: [name] HaddockMCCRA CKEN, Stark BrayLYNDA [name]. 11/16/2022 2:45 PM Medical Record Number: [id number] Patient Account Number: [id number] Date of Birth/Sex: Treating RN: 17-Oct-1946 (76 y.o. Orville GovernF) Palmer, Carrie Primary Care Provider: Driscilla GrammesA BO NZA, MA RITZA Other Clinician: Referring Provider: Treating Provider/Extender: Hulen Skainsannon, Ralph Benavidez A BO NZA, MA RITZA Weeks in Treatment: 6 Verbal / Phone Orders: No Diagnosis Coding ICD-10 Coding Code Description L97.312 Non-pressure chronic ulcer of right ankle with fat layer exposed I87.2 Venous insufficiency (chronic) (peripheral) I50.32 Chronic diastolic (congestive) heart failure E66.09 Other obesity due to excess calories Eppes, Gracia [name] ([id number]) [id number] Page 4 of 10 Follow-up Appointments ppointment in 1 week. - Dr. Lady Garyannon Room 1 Return A Anesthetic (In clinic) Topical Lidocaine 5% applied to wound bed - in clinic, prior to debridement Edema Control - Lymphedema / SCD / Other Bilateral Lower Extremities Avoid standing for long periods of time. Patient to wear own compression stockings every day. Moisturize legs daily. Additional Orders / Instructions Wound #6 Right,Lateral Ankle Other: - Run insurance for Theraskin or Apligraf Wound Treatment Wound #6 - Ankle Wound Laterality: Right, Lateral Cleanser: Soap and Water Discharge Instructions: May shower and wash wound with dial antibacterial soap and water prior to dressing change. Peri-Wound Care: Sween Lotion (Moisturizing lotion) Discharge Instructions: Apply  moisturizing lotion as directed Prim Dressing: Endoform 2x2 in ary Discharge Instructions: Moisten with saline Secondary Dressing: ABD Pad, 8x10 Discharge Instructions: Apply over primary dressing as directed. Secured With: Transpore Surgical Tape, 2x10 (in/yd) Discharge Instructions: Secure dressing with tape as directed. Compression  Wrap: ThreePress (3 layer compression wrap) Discharge Instructions: Apply three layer compression as directed. Patient Medications llergies: cocoa, red dye, Lyrica, Sulfa (Sulfonamide Antibiotics) A Notifications Medication Indication Start End prior to debridement 11/16/2022 lidocaine DOSE topical 5 % ointment - ointment topical once daily Electronic Signature(s) Signed: 11/16/2022 4:37:08 PM By: Duanne Guess MD FACS Entered By: Duanne Guess on 11/16/2022 16:02:32 -------------------------------------------------------------------------------- Problem List Details Patient Name: Date of Service: [name], Stark Bray [name]. 11/16/2022 2:45 PM Medical Record Number: [id number] Patient Account Number: [id number] Date of Birth/Sex: Treating RN: 1946-10-09 (76 y.o. F) Primary Care Provider: Driscilla Grammes, MA RITZA Other Clinician: Referring Provider: Treating Provider/Extender: Hulen Skains, MA RITZA Weeks in Treatment: 6 Active Problems ICD-10 Encounter Code Description Active Date MDM Diagnosis L97.312 Non-pressure chronic ulcer of right ankle with fat layer exposed 10/05/2022 No Yes Crutchley, Ellanore [name] ([id number]) [id number] Page 5 of 10 I87.2 Venous insufficiency (chronic) (peripheral) 10/05/2022 No Yes I50.32 Chronic diastolic (congestive) heart failure 10/05/2022 No Yes E66.09 Other obesity due to excess calories 10/05/2022 No Yes Inactive Problems Resolved Problems Electronic Signature(s) Signed: 11/16/2022 4:00:10 PM By: Duanne Guess MD FACS Entered By: Duanne Guess on 11/16/2022  16:00:10 -------------------------------------------------------------------------------- Progress Note Details Patient Name: Date of Service: [name], Stark Bray [name]. 11/16/2022 2:45 PM Medical Record Number: [id number] Patient Account Number: [id number] Date of Birth/Sex: Treating RN: Dec 17, 1945 (76 y.o. F) Primary Care Provider: Driscilla Grammes, MA RITZA Other Clinician: Referring Provider: Treating Provider/Extender: Hulen Skains, MA RITZA Weeks in Treatment: 6 Subjective Chief Complaint Information obtained from Patient Bilateral LE Ulcers 10/05/2022: RLE ulcer History of Present Illness (HPI) 07/09/18 on evaluation today patient presents for bilateral lower extremity ulcers. She has a past medical history significant for chronic venous stasis, hypertension, and lower extremity edema with associated skin changes. Unfortunately she tells me that roughly 3 weeks ago she noted ulcers on the left lower extremity which started given her trouble. Subsequently she ended up proceeding with applying mupirocin ointment and was keeping this open air for the most part. She states that it would scab over somewhat and then reopened. The wounds have been training quite significantly. Fortunately there does not appear to be any evidence of overt infection there is some erythematous type skin changes but I believe this is likely stasis dermatitis nothing more. Fortunately she has no evidence of any systemic infection. No fevers, chills, nausea, or vomiting noted at this time. She has been recommended before to wear compression stockings but she does not wear them on a regular basis. She has never been suggested to get Juxta-Lite compression wraps she was not aware of what these were. Patient had venous studies performed on 11/09/17 which showed no lower surety DVT or deep vein insufficiency noted. She did not have evaluation however of the superficial veins. There was full compressibility noted  throughout. Her arterial study which was performed on 11/18/17 actually showed that she has an ABI on the right of 1.17 with a TBI of 1.59 along with an ABI of 1.12 on the left and a TBI of 0.99. 07/17/18 on evaluation today patient appears to be doing much better in regard to her left lower Trinity ulcers. She's been tolerating the dressing changes without complication. There does not appear to be any evidence of infection which is good news. Overall I'm very pleased with the progress at this time. 07/24/18 on evaluation today patient's wound actually appears to be completely healed at this time. Fortunately there does not appear  to be any evidence of infection which is good news. Overall I'm very happy with the progress that has been made. She did get her Velcro compression wraps. READMISSION 12/31/2020 This is a patient we had in this clinic in 2019 felt to have chronic venous insufficiency wounds on her lower extremities. She was discharged with bilateral compression stockings. She tells [place] that she never really wore them or at best wore them sparingly. She is back in clinic today having a ulcer on her left lateral lower extremity since October and one on the right anterior for the last 2 weeks. I do not know that she is doing any primary dressing on these certainly not wearing any form of compression. She is already been evaluated by vein and vascular on 11/15/2020. They noted that she did have ulcers on the left leg in October that healed with a need Unna boot. She had a reflux study on 12/20 that did not show evidence of a DVT or venous reflux bilaterally. They was not felt that she had arterial insufficiency based on the fact that she had brisk biphasic Doppler signals in the bilateral DP PT. The patient comes in today with superficial wounds on the right anterior mid tibia and the left lateral calf. These are small appear to have healthy surfaces. Past medical history includes cervical and upper  thoracic spine surgery on 7/21, hernia repair early in January, hypertension, hyperlipidemia, hypothyroidism, gastric bypass surgery, DVT in the right peroneal artery and left popliteal and peroneal veins. Next problem CHF. She does not have diabetes KISAMORE, Sherrilyn [name] ([id number]) [id number] Page 6 of 10 ABIs in our clinic were 1.1 on the right and 1.2 on the lower 2/11; the patient's wound on the left lateral and right anterior lower leg are closed for edema control is good. She has an assortment of old juxta lite stockings with old liners. She does not like to wear anything in particular under the compression part of the juxta lite stocking. I think she is going to do exactly what she wants. READMISSION 10/05/2022 This is a 76 year old woman last seen in our clinic about 18 months ago. She has a history of venous stasis and stasis dermatitis. A reflux study done in 2021 did not show any evidence of venous reflux. She says about 3 weeks ago, she noticed an ulcer on her right lateral malleolus. She was applying triple antibiotic ointment to it for a while and then was soaking it in Epsom salts. She reports that she usually wears compression stockings, but that they were rubbing so she did not wear 1 on her right leg since the wound appeared, she has a stocking with the toe and heel cut out applied to her left leg. On examination, she has changes of chronic stasis dermatitis on the right. There is a shallow ulcer overlying the lateral malleolus. The fat layer is exposed. It is a little bit desiccated but fairly clean with just some light eschar and slough present. No malodor or purulent drainage. 10/13/2022: The wound is a little smaller today. Although the moisture balance is better than last week, it is still a bit dry. There is still some slough accumulation on the surface. 10/25/2022: The wound continues to contract. There is still thick layer of slough on the surface.  The moisture balance of the wound is better, but the periwound skin is slightly macerated. 11/02/2022: The wound is smaller and shallower today. There is still slough on the surface, but the periwound skin  is intact. The wound surface is still drier than ideal. 12/15; wound is about the same size minimal depth. Under illumination some debris on the surface she has been using Prisma 11/16/2022: The leg wrap became saturated when her makeshift cast protector leaked. The wound is a little bit larger today, as result of moisture-related tissue breakdown. The surface remains fairly fibrotic. Patient History Information obtained from Patient. Family History Cancer - Mother,Father, Diabetes - Father, Heart Disease - Father, Hypertension - Father, Stroke - Mother, No family history of Hereditary Spherocytosis, Kidney Disease, Lung Disease, Seizures, Thyroid Problems, Tuberculosis. Social History Never smoker, Marital Status - Married, Alcohol Use - Daily - 1.5 bottles of wine daily, Drug Use - No History, Caffeine Use - Daily - coffee. Medical History Eyes Patient has history of Cataracts - mild Denies history of Glaucoma, Optic Neuritis Ear/Nose/Mouth/Throat Denies history of Chronic sinus problems/congestion, Middle ear problems Hematologic/Lymphatic Patient has history of Anemia - iron deficiency, Lymphedema Denies history of Hemophilia, Human Immunodeficiency Virus, Sickle Cell Disease Respiratory Denies history of Aspiration, Asthma, Chronic Obstructive Pulmonary Disease (COPD), Pneumothorax, Sleep Apnea, Tuberculosis Cardiovascular Patient has history of Congestive Heart Failure, Hypertension, Peripheral Venous Disease Denies history of Angina, Arrhythmia, Coronary Artery Disease, Deep Vein Thrombosis, Hypotension, Myocardial Infarction, Peripheral Arterial Disease, Phlebitis, Vasculitis Gastrointestinal Denies history of Cirrhosis , Colitis, Crohnoos, Hepatitis A, Hepatitis B, Hepatitis  C Endocrine Denies history of Type I Diabetes, Type II Diabetes Genitourinary Denies history of End Stage Renal Disease Immunological Denies history of Lupus Erythematosus, Raynaudoos, Scleroderma Integumentary (Skin) Denies history of History of Burn Musculoskeletal Patient has history of Osteoarthritis Denies history of Gout, Rheumatoid Arthritis, Osteomyelitis Neurologic Patient has history of Neuropathy - bila Denies history of Dementia, Quadriplegia, Paraplegia, Seizure Disorder Oncologic Denies history of Received Chemotherapy, Received Radiation Psychiatric Patient has history of Confinement Anxiety Denies history of Anorexia/bulimia Hospitalization/Surgery History - right arm surgery. - gastric bypass. - hysterectomy. - tonsillectomy. - hernia repair. - cervical myelopathy with spinal cord compression. Medical A Surgical History Notes nd Constitutional Symptoms (General Health) obesity Ear/Nose/Mouth/Throat seasonal allergies Cardiovascular aortic murmur, hyperlipidemia, hypertriglyceridemia Gastrointestinal UGI bleed, Endocrine LYGIA, OLAES [name] ([id number]) [phone].pdf Page 7 of 10 hypothyroidism Objective Constitutional She is hypertensive, but asymptomatic.Marland Kitchen No acute distress. Vitals Time Taken: 3:08 PM, Height: 65 in, Weight: 229 lbs, BMI: 38.1, Temperature: 97.8 F, Pulse: 67 bpm, Respiratory Rate: 18 breaths/min, Blood Pressure: 155/80 mmHg. Respiratory Normal work of breathing on room air. General Notes: 11/16/2022: The wound is a little bit larger today, as result of moisture-related tissue breakdown. The surface remains fairly fibrotic. Integumentary (Hair, Skin) Wound #6 status is Open. Original cause of wound was Gradually Appeared. The date acquired was: 09/25/2022. The wound has been in treatment 6 weeks. The wound is located on the Right,Lateral Ankle. The wound measures 1cm length x 1cm width x 0.1cm depth; 0.785cm^2  area and 0.079cm^3 volume. There is no tunneling or undermining noted. There is a medium amount of serous drainage noted. The wound margin is flat and intact. There is medium (34-66%) pink granulation within the wound bed. There is a medium (34-66%) amount of necrotic tissue within the wound bed including Adherent Slough. The periwound skin appearance exhibited: Maceration, Erythema. The periwound skin appearance did not exhibit: Dry/Scaly. The surrounding wound skin color is noted with erythema which is circumferential. Periwound temperature was noted as No Abnormality. The periwound has tenderness on palpation. Assessment Active Problems ICD-10 Non-pressure chronic ulcer of right ankle with fat layer exposed Venous insufficiency (chronic) (  peripheral) Chronic diastolic (congestive) heart failure Other obesity due to excess calories Procedures Wound #6 Pre-procedure diagnosis of Wound #6 is a Venous Leg Ulcer located on the Right,Lateral Ankle .Severity of Tissue Pre Debridement is: Fat layer exposed. There was a Excisional Skin/Subcutaneous Tissue Debridement with a total area of 1 sq cm performed by Duanne Guess, MD. With the following instrument(s): Curette to remove Non-Viable tissue/material. Material removed includes Subcutaneous Tissue and Slough and after achieving pain control using Lidocaine 5% topical ointment. No specimens were taken. A time out was conducted at 15:20, prior to the start of the procedure. A Minimum amount of bleeding was controlled with Pressure. The procedure was tolerated well with a pain level of 0 throughout and a pain level of 0 following the procedure. Post Debridement Measurements: 1cm length x 1cm width x 0.1cm depth; 0.079cm^3 volume. Character of Wound/Ulcer Post Debridement is improved. Severity of Tissue Post Debridement is: Fat layer exposed. Post procedure Diagnosis Wound #6: Same as Pre-Procedure General Notes: scribed for Dr. Lady Gary by Redmond Pulling, RN. Pre-procedure diagnosis of Wound #6 is a Venous Leg Ulcer located on the Right,Lateral Ankle . There was a Three Layer Compression Therapy Procedure by Redmond Pulling, RN. Post procedure Diagnosis Wound #6: Same as Pre-Procedure Plan Follow-up Appointments: Return Appointment in 1 week. - Dr. Lady Gary Room 1 Anesthetic: (In clinic) Topical Lidocaine 5% applied to wound bed - in clinic, prior to debridement Edema Control - Lymphedema / SCD / Other: Avoid standing for long periods of time. Patient to wear own compression stockings every day. Moisturize legs daily. Additional Orders / Instructions: Wound #6 Right,Lateral Ankle: LAKEVA, HOLLON ([id number]) [phone].pdf Page 8 of 10 Other: - Run insurance for [employer] or [employer] The following medication(s) was prescribed: lidocaine topical 5 % ointment ointment topical once daily for prior to debridement was prescribed at facility WOUND #6: - Ankle Wound Laterality: Right, Lateral Cleanser: Soap and Water Discharge Instructions: May shower and wash wound with dial antibacterial soap and water prior to dressing change. Peri-Wound Care: Sween Lotion (Moisturizing lotion) Discharge Instructions: Apply moisturizing lotion as directed Prim Dressing: Endoform 2x2 in ary Discharge Instructions: Moisten with saline Secondary Dressing: ABD Pad, 8x10 Discharge Instructions: Apply over primary dressing as directed. Secured With: Transpore Surgical T ape, 2x10 (in/yd) Discharge Instructions: Secure dressing with tape as directed. Com pression Wrap: ThreePress (3 layer compression wrap) Discharge Instructions: Apply three layer compression as directed. 11/16/2022: The wound is a little bit larger today, as result of moisture-related tissue breakdown. The surface remains fairly fibrotic. I used a curette to debride slough and nonviable subcutaneous tissue from the wound. I was fairly aggressive in my  debridement, hoping to stimulate the healing cascade. I am also going to change her dressing to endoform in hopes of encouraging granulation tissue formation. We will run her insurance for [employer] and [employer], as well. Follow-up in 1 week. Electronic Signature(s) Signed: 11/16/2022 4:03:25 PM By: Duanne Guess MD FACS Entered By: Duanne Guess on 11/16/2022 16:03:25 -------------------------------------------------------------------------------- HxROS Details Patient Name: Date of Service: [name], Arshia [name]. 11/16/2022 2:45 PM Medical Record Number: [id number] Patient Account Number: [id number] Date of Birth/Sex: Treating RN: July 26, 1946 (76 y.o. F) Primary Care Provider: Driscilla Grammes, MA RITZA Other Clinician: Referring Provider: Treating Provider/Extender: Hulen Skains, MA RITZA Weeks in Treatment: 6 Information Obtained From Patient Constitutional Symptoms (General Health) Medical History: Past Medical History Notes: obesity Eyes Medical History: Positive for: Cataracts - mild Negative for: Glaucoma; Optic Neuritis  Ear/Nose/Mouth/Throat Medical History: Negative for: Chronic sinus problems/congestion; Middle ear problems Past Medical History Notes: seasonal allergies Hematologic/Lymphatic Medical History: Positive for: Anemia - iron deficiency; Lymphedema Negative for: Hemophilia; Human Immunodeficiency Virus; Sickle Cell Disease Respiratory Medical History: Negative for: Aspiration; Asthma; Chronic Obstructive Pulmonary Disease (COPD); Pneumothorax; Sleep Apnea; Tuberculosis Ambrosio, Ashanna [name] ([id number]) [id number] Page 9 of 10 Cardiovascular Medical History: Positive for: Congestive Heart Failure; Hypertension; Peripheral Venous Disease Negative for: Angina; Arrhythmia; Coronary Artery Disease; Deep Vein Thrombosis; Hypotension; Myocardial Infarction; Peripheral Arterial Disease; Phlebitis; Vasculitis Past Medical  History Notes: aortic murmur, hyperlipidemia, hypertriglyceridemia Gastrointestinal Medical History: Negative for: Cirrhosis ; Colitis; Crohns; Hepatitis A; Hepatitis B; Hepatitis C Past Medical History Notes: UGI bleed, Endocrine Medical History: Negative for: Type I Diabetes; Type II Diabetes Past Medical History Notes: hypothyroidism Genitourinary Medical History: Negative for: End Stage Renal Disease Immunological Medical History: Negative for: Lupus Erythematosus; Raynauds; Scleroderma Integumentary (Skin) Medical History: Negative for: History of Burn Musculoskeletal Medical History: Positive for: Osteoarthritis Negative for: Gout; Rheumatoid Arthritis; Osteomyelitis Neurologic Medical History: Positive for: Neuropathy - bila Negative for: Dementia; Quadriplegia; Paraplegia; Seizure Disorder Oncologic Medical History: Negative for: Received Chemotherapy; Received Radiation Psychiatric Medical History: Positive for: Confinement Anxiety Negative for: Anorexia/bulimia HBO Extended History Items Eyes: Cataracts Immunizations Pneumococcal Vaccine: Received Pneumococcal Vaccination: No Implantable Devices No devices added Hospitalization / Surgery History Type of Hospitalization/Surgery right arm surgery gastric bypass Furry, Drusilla [name] ([id number]) [id number] Page 10 of 10 hysterectomy tonsillectomy hernia repair cervical myelopathy with spinal cord compression Family and Social History Cancer: Yes - Mother,Father; Diabetes: Yes - Father; Heart Disease: Yes - Father; Hereditary Spherocytosis: No; Hypertension: Yes - Father; Kidney Disease: No; Lung Disease: No; Seizures: No; Stroke: Yes - Mother; Thyroid Problems: No; Tuberculosis: No; Never smoker; Marital Status - Married; Alcohol Use: Daily - 1.5 bottles of wine daily; Drug Use: No History; Caffeine Use: Daily - coffee; Financial Concerns: No; Food, Clothing or Shelter Needs:  No; Support System Lacking: No; Transportation Concerns: No Electronic Signature(s) Signed: 11/16/2022 4:37:08 PM By: Duanne Guess MD FACS Entered By: Duanne Guess on 11/16/2022 16:01:36 -------------------------------------------------------------------------------- SuperBill Details Patient Name: Date of Service: [name], [place] [name]. 11/16/2022 Medical Record Number: [id number] Patient Account Number: [id number] Date of Birth/Sex: Treating RN: 03-Dec-1945 (76 y.o. F) Primary Care Provider: Driscilla Grammes, MA RITZA Other Clinician: Referring Provider: Treating Provider/Extender: Hulen Skains, MA RITZA Weeks in Treatment: 6 Diagnosis Coding ICD-10 Codes Code Description L97.312 Non-pressure chronic ulcer of right ankle with fat layer exposed I87.2 Venous insufficiency (chronic) (peripheral) I50.32 Chronic diastolic (congestive) heart failure E66.09 Other obesity due to excess calories Facility Procedures : CPT4 Code: [id number] Description: 11042 - DEB SUBQ TISSUE 20 SQ CM/< ICD-10 Diagnosis Description L97.312 Non-pressure chronic ulcer of right ankle with fat layer exposed Modifier: Quantity: 1 Physician Procedures : CPT4 Code Description Modifier [id number] 99213 - WC PHYS LEVEL 3 - EST PT 25 ICD-10 Diagnosis Description L97.312 Non-pressure chronic ulcer of right ankle with fat layer exposed I87.2 Venous insufficiency (chronic) (peripheral) I50.32 Chronic diastolic  (congestive) heart failure E66.09 Other obesity due to excess calories Quantity: 1 : [id number] 11042 - WC PHYS SUBQ TISS 20 SQ CM ICD-10 Diagnosis Description L97.312 Non-pressure chronic ulcer of right ankle with fat layer exposed Quantity: 1 Electronic Signature(s) Signed: 11/16/2022 4:03:41 PM By: Duanne Guess MD FACS Entered By: Duanne Guess on 11/16/2022 16:03:40

## 2022-11-24 ENCOUNTER — Encounter (HOSPITAL_BASED_OUTPATIENT_CLINIC_OR_DEPARTMENT_OTHER): Payer: PPO | Admitting: General Surgery

## 2022-11-24 DIAGNOSIS — L97312 Non-pressure chronic ulcer of right ankle with fat layer exposed: Secondary | ICD-10-CM | POA: Diagnosis not present

## 2022-11-24 DIAGNOSIS — Z1231 Encounter for screening mammogram for malignant neoplasm of breast: Secondary | ICD-10-CM

## 2022-11-24 NOTE — Progress Notes (Signed)
[name], [name] ([id number]) [id number] Page 1 of 4 Visit Report for 11/24/2022 Arrival Information Details Patient Name: Date of Service: [name], [name] 11/24/2022 11:30 A M Medical Record Number: [id number] Patient Account Number: [id number] Date of Birth/Sex: Treating RN: [birth year] (77 y.o. [name], Ladona Ridgel Primary Care Raquon Milledge: Driscilla Grammes, MA RITZA Other Clinician: Referring Baya Lentz: Treating Khalia Gong/Extender: Hulen Skains, MA RITZA Weeks in Treatment: 7 Visit Information History Since Last Visit Added or deleted any medications: No Patient Arrived: Cane Any new allergies or adverse reactions: No Arrival Time: 11:39 Had a fall or experienced change in No Accompanied By: family activities of daily living that may affect Transfer Assistance: None risk of falls: Patient Identification Verified: Yes Signs or symptoms of abuse/neglect since last visito No Secondary Verification Process Completed: Yes Hospitalized since last visit: No Patient Requires Transmission-Based Precautions: No Implantable device outside of the clinic excluding No Patient Has Alerts: No cellular tissue based products placed in the center since last visit: Has Dressing in Place as Prescribed: Yes Has Compression in Place as Prescribed: Yes Pain Present Now: No Electronic Signature(s) Signed: 11/24/2022 11:59:46 AM By: Samuella Bruin Entered By: Samuella Bruin on 11/24/2022 11:39:41 -------------------------------------------------------------------------------- Compression Therapy Details Patient Name: Date of Service: [name], [name] 11/24/2022 11:30 A M Medical Record Number: [id number] Patient Account Number: [id number] Date of Birth/Sex: Treating RN: 02-26-1946 (76 y.o. [name] Primary Care Hendrix Yurkovich: Driscilla Grammes, MA RITZA Other Clinician: Referring Gillie Fleites: Treating Nykayla Marcelli/Extender: Hulen Skains, MA  RITZA Weeks in Treatment: 7 Compression Therapy Performed for Wound Assessment: Wound #6 Right,Lateral Ankle Performed By: Clinician Samuella Bruin, RN Compression Type: Three Layer Electronic Signature(s) Signed: 11/24/2022 11:59:46 AM By: Samuella Bruin Entered By: Samuella Bruin on 11/24/2022 11:40:18 -------------------------------------------------------------------------------- Encounter Discharge Information Details Patient Name: Date of Service: [name], [name] 11/24/2022 11:30 A M Medical Record Number: [id number] Patient Account Number: [id number] [name], [name] ([id number]) [id number] Page 2 of 4 Date of Birth/Sex: Treating RN: 1946-11-16 (76 y.o. [name] Primary Care Nikky Duba: Driscilla Grammes, MA RITZA Other Clinician: Referring Shequita Peplinski: Treating Berlin Mokry/Extender: Hulen Skains, MA RITZA Weeks in Treatment: 7 Encounter Discharge Information Items Discharge Condition: Stable Ambulatory Status: Cane Discharge Destination: Home Transportation: Private Auto Accompanied By: family Schedule Follow-up Appointment: Yes Clinical Summary of Care: Patient Declined Electronic Signature(s) Signed: 11/24/2022 11:59:46 AM By: Gelene Mink By: Samuella Bruin on 11/24/2022 11:40:50 -------------------------------------------------------------------------------- Patient/Caregiver Education Details Patient Name: Date of Service: [name], [name] 12/29/2023andnbsp11:30 A M Medical Record Number: [id number] Patient Account Number: [id number] Date of Birth/Gender: Treating RN: 1946/04/22 (76 y.o. [name] Primary Care Physician: Driscilla Grammes, MA RITZA Other Clinician: Referring Physician: Treating Physician/Extender: Hulen Skains, MA RITZA Weeks in Treatment: 7 Education Assessment Education Provided To: Patient Education Topics Provided Wound/Skin Impairment: Methods:  Explain/Verbal Responses: Reinforcements needed, State content correctly Electronic Signature(s) Signed: 11/24/2022 11:59:46 AM By: Samuella Bruin Entered By: Samuella Bruin on 11/24/2022 11:40:37 -------------------------------------------------------------------------------- Wound Assessment Details Patient Name: Date of Service: [name], [name] 11/24/2022 11:30 A M Medical Record Number: [id number] Patient Account Number: [id number] Date of Birth/Sex: Treating RN: 05-13-1946 (76 y.o. [name] Primary Care Harlee Eckroth: Driscilla Grammes, MA RITZA Other Clinician: Referring Mavric Cortright: Treating Deloyce Walthers/Extender: Hulen Skains, MA RITZA Weeks in Treatment: 7 Wound Status Wound Number: 6 Primary Venous Leg Ulcer Etiology: Wound Location: Right, Lateral Ankle Wound Open Wounding Event: Gradually Appeared Status:  Date Acquired: 09/25/2022 [name], [name] ([id number]) [phone].pdf Page 3 of 4 Date Acquired: 09/25/2022 Comorbid Cataracts, Anemia, Lymphedema, Congestive Heart Failure, Weeks Of Treatment: 7 History: Hypertension, Peripheral Venous Disease, Osteoarthritis, Clustered Wound: No Neuropathy, Confinement Anxiety Wound Measurements Length: (cm) 1 Width: (cm) 1 Depth: (cm) 0.1 Area: (cm) 0.785 Volume: (cm) 0.079 % Reduction in Area: 24.3% % Reduction in Volume: 24% Epithelialization: Small (1-33%) Wound Description Classification: Full Thickness Without Exposed Support Structures Wound Margin: Flat and Intact Exudate Amount: Medium Exudate Type: Serous Exudate Color: amber Foul Odor After Cleansing: No Slough/Fibrino Yes Wound Bed Granulation Amount: Medium (34-66%) Granulation Quality: Pink Necrotic Amount: Medium (34-66%) Necrotic Quality: Adherent Slough Periwound Skin Texture Texture Color No Abnormalities Noted: No No Abnormalities Noted: No Erythema: Yes Moisture Erythema Location:  Circumferential No Abnormalities Noted: No Dry / Scaly: No Temperature / Pain Maceration: Yes Temperature: No Abnormality Tenderness on Palpation: Yes Treatment Notes Wound #6 (Ankle) Wound Laterality: Right, Lateral Cleanser Soap and Water Discharge Instruction: May shower and wash wound with dial antibacterial soap and water prior to dressing change. Peri-Wound Care Sween Lotion (Moisturizing lotion) Discharge Instruction: Apply moisturizing lotion as directed Topical Primary Dressing Endoform 2x2 in Discharge Instruction: Moisten with saline Secondary Dressing ABD Pad, 8x10 Discharge Instruction: Apply over primary dressing as directed. Secured With Transpore Surgical Tape, 2x10 (in/yd) Discharge Instruction: Secure dressing with tape as directed. Compression Wrap ThreePress (3 layer compression wrap) Discharge Instruction: Apply three layer compression as directed. Compression Stockings Add-Ons Electronic Signature(s) Signed: 11/24/2022 11:59:46 AM By: Samuella Bruin Entered By: Samuella Bruin on 11/24/2022 11:39:54 [name] ([id number]) [id number] Page 4 of 4 -------------------------------------------------------------------------------- Vitals Details Patient Name: Date of Service: [name], [name] 11/24/2022 11:30 A M Medical Record Number: [id number] Patient Account Number: [id number] Date of Birth/Sex: Treating RN: [birth year] (76 y.o. [name] Primary Care Yanisa Goodgame: Driscilla Grammes, MA RITZA Other Clinician: Referring Juanmanuel Marohl: Treating Kennedy Brines/Extender: Hulen Skains, MA RITZA Weeks in Treatment: 7 Vital Signs Time Taken: 11:45 Temperature (F): 97.6 Height (in): 65 Pulse (bpm): 63 Weight (lbs): 229 Respiratory Rate (breaths/min): 18 Body Mass Index (BMI): 38.1 Blood Pressure (mmHg): 182/80 Reference Range: 80 - 120 mg / dl Electronic Signature(s) Signed: 11/24/2022 11:59:46 AM By:  Samuella Bruin Entered By: Samuella Bruin on 11/24/2022 11:46:07

## 2022-11-24 NOTE — Progress Notes (Signed)
NATAVIA, SUBLETTE ([id number]) [id number] Page 1 of 1 Visit Report for 11/24/2022 SuperBill Details Patient Name: Date of Service: [name], [name] 11/24/2022 Medical Record Number: [id number] Patient Account Number: [id number] Date of Birth/Sex: Treating RN: 07-28-1946 (77 y.o. [name] Primary Care Provider: Driscilla Grammes, MA RITZA Other Clinician: Referring Provider: Treating Provider/Extender: Hulen Skains, MA RITZA Weeks in Treatment: 7 Diagnosis Coding ICD-10 Codes Code Description L97.312 Non-pressure chronic ulcer of right ankle with fat layer exposed I87.2 Venous insufficiency (chronic) (peripheral) I50.32 Chronic diastolic (congestive) heart failure E66.09 Other obesity due to excess calories Facility Procedures CPT4 Code Description Modifier Quantity [id number] (Facility Use Only) [phone] - APPLY MULTLAY COMPRS LWR RT LEG 1 ICD-10 Diagnosis Description L97.312 Non-pressure chronic ulcer of right ankle with fat layer exposed Electronic Signature(s) Signed: 11/24/2022 11:59:46 AM By: Samuella Bruin Signed: 11/24/2022 12:06:46 PM By: Duanne Guess MD FACS Entered By: Samuella Bruin on 11/24/2022 11:41:02

## 2022-11-30 ENCOUNTER — Ambulatory Visit (INDEPENDENT_AMBULATORY_CARE_PROVIDER_SITE_OTHER): Payer: PPO | Admitting: Sports Medicine

## 2022-11-30 ENCOUNTER — Encounter (HOSPITAL_BASED_OUTPATIENT_CLINIC_OR_DEPARTMENT_OTHER): Payer: PPO | Attending: General Surgery | Admitting: General Surgery

## 2022-11-30 DIAGNOSIS — L03115 Cellulitis of right lower limb: Secondary | ICD-10-CM | POA: Diagnosis not present

## 2022-11-30 DIAGNOSIS — E6609 Other obesity due to excess calories: Secondary | ICD-10-CM | POA: Diagnosis not present

## 2022-11-30 DIAGNOSIS — L03116 Cellulitis of left lower limb: Secondary | ICD-10-CM | POA: Diagnosis not present

## 2022-11-30 DIAGNOSIS — F40243 Fear of flying: Secondary | ICD-10-CM | POA: Diagnosis not present

## 2022-11-30 DIAGNOSIS — I5032 Chronic diastolic (congestive) heart failure: Secondary | ICD-10-CM | POA: Insufficient documentation

## 2022-11-30 DIAGNOSIS — Z6838 Body mass index (BMI) 38.0-38.9, adult: Secondary | ICD-10-CM | POA: Insufficient documentation

## 2022-11-30 DIAGNOSIS — L97312 Non-pressure chronic ulcer of right ankle with fat layer exposed: Secondary | ICD-10-CM | POA: Diagnosis not present

## 2022-11-30 DIAGNOSIS — I872 Venous insufficiency (chronic) (peripheral): Secondary | ICD-10-CM | POA: Insufficient documentation

## 2022-11-30 MED ORDER — LORAZEPAM 2 MG PO TABS: ORAL_TABLET | ORAL | 0 refills | Status: DC

## 2022-11-30 MED ORDER — DOXYCYCLINE HYCLATE 100 MG PO TABS: 100.0000 mg | ORAL_TABLET | Freq: Two times a day (BID) | ORAL | 0 refills | Status: AC

## 2022-11-30 NOTE — Progress Notes (Signed)
[name] [name] [name] ([id number]) [id number] Page 1 of 7 Visit Report for 11/30/2022 Arrival Information Details Patient Name: Date of Service: [name], [name] [name] 11/30/2022 9:15 A M Medical Record Number: [id number] Patient Account Number: [id number] Date of Birth/Sex: Treating RN: [name] (77 y.o. [name] [name] [name] Primary Care [name] [name] [name]: [name] Mallick, MA [name] Other Clinician: Referring [name] [name] [name]: Treating [name] [name] [name]/Extender: [name] Door, MA [name] Weeks in Treatment: 8 Visit Information History Since Last Visit Added or deleted any medications: No Patient Arrived: [name] Any [name] allergies or adverse reactions: No Arrival Time: 09:23 Had a fall or experienced change in No Accompanied By: [name] [name] [name] activities of daily living that may affect Transfer Assistance: None risk of falls: Patient Identification Verified: Yes Signs or symptoms of abuse/neglect since last visito No Secondary Verification Process Completed: Yes Hospitalized since last visit: No Patient Requires Transmission-Based Precautions: No Implantable device outside of the clinic excluding No Patient Has Alerts: No cellular tissue based products placed in the center since last visit: Has Dressing in Place as Prescribed: Yes Has Compression in Place as Prescribed: Yes Pain Present Now: No Electronic Signature(s) Signed: 11/30/2022 2:29:22 PM By: [name] [name] [name] Entered By: [name] [name] [name] on 11/30/2022 09:25:49 -------------------------------------------------------------------------------- Compression Therapy Details Patient Name: Date of Service: [name] [name] [name], [name] [name] [name]. 11/30/2022 9:15 A M Medical Record Number: [id number] Patient Account Number: [id number] Date of Birth/Sex: Treating RN: [name] [name] [name] (77 y.o. [name] [name] [name] Primary Care [name] [name] [name]: [name] Mallick, MA [name] Other Clinician: Referring [name] [name] [name]: Treating [name] [name] [name]/Extender: [name] Door, MA [name] Weeks  in Treatment: 8 Compression Therapy Performed for Wound Assessment: Wound #6 Right,Lateral Ankle Performed By: Clinician [name] Peals, RN Compression Type: Three Layer Post Procedure Diagnosis Same as Pre-procedure Electronic Signature(s) Signed: 11/30/2022 2:29:22 PM By: [name] [name] [name] Entered By: [name] [name] [name] on 11/30/2022 09:54:38 [name] [name] [name] ([id number]) [id number] Page 2 of 7 -------------------------------------------------------------------------------- Encounter Discharge Information Details Patient Name: Date of Service: [name], [name] [name] 11/30/2022 9:15 A M Medical Record Number: [id number] Patient Account Number: [id number] Date of Birth/Sex: Treating RN: [birth year] (77 y.o. [name] [name] [name] Primary Care Kalynne Womac: [name] Mallick, MA [name] Other Clinician: Referring Jaquana Geiger: Treating [name] [name] [name]: [name] Door, MA [name] Weeks in Treatment: 8 Encounter Discharge Information Items Post Procedure Vitals Discharge Condition: Stable Temperature (F): 97.7 Ambulatory Status: [name] Pulse (bpm): 66 Discharge Destination: Home Respiratory Rate (breaths/min): 18 Transportation: Private Auto Blood Pressure (mmHg): 171/84 Accompanied By: [name] Schedule Follow-up Appointment: Yes Clinical Summary of Care: Patient Declined Electronic Signature(s) Signed: 11/30/2022 2:29:22 PM By: [name] [name] [name] Entered By: [name] [name] [name] on 11/30/2022 09:56:21 -------------------------------------------------------------------------------- Lower Extremity Assessment Details Patient Name: Date of Service: [name], [name] [name] [name] [name] [name]. 11/30/2022 9:15 A M Medical Record Number: [id number] Patient Account Number: [id number] Date of Birth/Sex: Treating RN: [name] (77 y.o. [name] [name] [name] Primary Care Antjuan Rothe: [name] Mallick, MA [name] Other Clinician: Referring [name] [name] [name]: Treating [name] [name] [name]/Extender: [name] Door, MA  [name] Weeks in Treatment: 8 Edema Assessment Assessed: [Left: No] [Right: No] [Left: Edema] [Right: :] Calf Left: Right: Point of Measurement: From Medial Instep 34.6 cm Ankle Left: Right: Point of Measurement: From Medial Instep 21.8 cm Vascular Assessment Pulses: Dorsalis Pedis Palpable: [Right:Yes] Electronic Signature(s) Signed: 11/30/2022 2:29:22 PM By: [name] [name] [name] Entered By: [name] [name] [name] on 11/30/2022 09:35:49 Multi Wound Chart Details -------------------------------------------------------------------------------- [name] [name] [name] ([id number]) [id number] Page 3 of 7 Patient Name: Date of Service: [name], [name] [name] 11/30/2022 9:15 A M Medical Record Number: [id number] Patient Account Number: [id number] Date of  Birth/Sex: Treating RN: [name] (77 y.o. F) Primary Care [name] [name] [name]: [name] Grammes, MA [name] Other Clinician: Referring [name] [name] [name]: Treating [name] [name] [name]/Extender: [name] Skains, MA [name] Weeks in Treatment: 8 Vital Signs Height(in): 65 Pulse(bpm): 66 Weight(lbs): 229 Blood Pressure(mmHg): 171/84 Body Mass Index(BMI): 38.1 Temperature(F): 97.7 Respiratory Rate(breaths/min): 18 Wound Assessments Wound Number: 6 N/A N/A Photos: N/A N/A Right, Lateral Ankle N/A N/A Wound Location: Gradually Appeared N/A N/A Wounding Event: Venous Leg Ulcer N/A N/A Primary Etiology: Cataracts, Anemia, Lymphedema, N/A N/A Comorbid History: Congestive Heart Failure, Hypertension, Peripheral Venous Disease, Osteoarthritis, Neuropathy, Confinement Anxiety 09/25/2022 N/A N/A Date Acquired: 8 N/A N/A Weeks of Treatment: Open N/A N/A Wound Status: No N/A N/A Wound Recurrence: 1.2x1.3x0.2 N/A N/A Measurements L x W x D (cm) 1.225 N/A N/A A (cm) : rea 0.245 N/A N/A Volume (cm) : -18.10% N/A N/A % Reduction in A rea: -135.60% N/A N/A % Reduction in Volume: Full Thickness Without Exposed N/A  N/A Classification: Support Structures Medium N/A N/A Exudate A mount: Serous N/A N/A Exudate Type: amber N/A N/A Exudate Color: Flat and Intact N/A N/A Wound Margin: Medium (34-66%) N/A N/A Granulation A mount: Pink N/A N/A Granulation Quality: Medium (34-66%) N/A N/A Necrotic A mount: Fat Layer (Subcutaneous Tissue): Yes N/A N/A Exposed Structures: Fascia: No Tendon: No Muscle: No Joint: No Bone: No Small (1-33%) N/A N/A Epithelialization: Debridement - Selective/Open Wound N/A N/A Debridement: Pre-procedure Verification/Time Out 09:40 N/A N/A Taken: Lidocaine 4% Topical Solution N/A N/A Pain Control: Slough N/A N/A Tissue Debrided: Non-Viable Tissue N/A N/A Level: 1.56 N/A N/A Debridement A (sq cm): rea Curette N/A N/A Instrument: Minimum N/A N/A Bleeding: Pressure N/A N/A Hemostasis A chieved: Procedure was tolerated well N/A N/A Debridement Treatment Response: 1.2x1.3x0.2 N/A N/A Post Debridement Measurements L x W x D (cm) 0.245 N/A N/A Post Debridement Volume: (cm) Maceration: Yes N/A N/A Periwound Skin Moisture: Dry/Scaly: No Erythema: Yes N/A N/A Periwound Skin Color: Circumferential N/A N/A Erythema Location: No Abnormality N/A N/A Temperature: Yes N/A N/A Tenderness on Palpation: Debridement N/A N/A Procedures PerformedHARLEAN, [name] [name] ([id number]) [id number] Page 4 of 7 Treatment Notes Electronic Signature(s) Signed: 11/30/2022 9:44:26 AM By: Duanne Guess MD FACS Entered By: Duanne Guess on 11/30/2022 09:44:26 -------------------------------------------------------------------------------- Multi-Disciplinary Care Plan Details Patient Name: Date of Service: [name] [name] [name], [name] [place] [name] [name]. 11/30/2022 9:15 A M Medical Record Number: [id number] Patient Account Number: [id number] Date of Birth/Sex: Treating RN: [birth year][name] (77 y.o. [name] Primary Care Matilda Fleig: [name] Grammes, MA [name] Other  Clinician: Referring Makylee Sanborn: Treating Lenzi Marmo/Extender: [name] Skains, MA [name] Weeks in Treatment: 8 Active Inactive Nutrition Nursing Diagnoses: Potential for alteratiion in Nutrition/Potential for imbalanced nutrition Goals: Patient/caregiver agrees to and verbalizes understanding of need to use nutritional supplements and/or vitamins as prescribed Date Initiated: 10/13/2022 Target Resolution Date: 12/29/2022 Goal Status: Active Interventions: Provide education on nutrition Treatment Activities: Dietary management education, guidance and counseling : 10/13/2022 Education provided on Nutrition : 10/25/2022 Notes: Wound/Skin Impairment Nursing Diagnoses: Impaired tissue integrity Goals: Ulcer/skin breakdown will have a volume reduction of 30% by week 4 Date Initiated: 10/13/2022 Target Resolution Date: 12/30/2022 Goal Status: Active Interventions: Provide education on ulcer and skin care Notes: Electronic Signature(s) Signed: 11/30/2022 2:29:22 PM By: Samuella Bruin Entered By: Samuella Bruin on 11/30/2022 09:40:11 Pain Assessment Details -------------------------------------------------------------------------------- [name] [name] [name] ([id number]) [id number] Page 5 of 7 Patient Name: Date of Service: [name], [name] [name] 11/30/2022 9:15 A M Medical Record Number: [id number] Patient Account Number: [id number] Date of Birth/Sex:  Treating RN: [birth year] (77 y.o. [name] [name] [name] Primary Care Dannie Woolen: [name] Mallick, MA [name] Other Clinician: Referring Winferd Wease: Treating Shawnn Bouillon/Extender: [name] Door, MA [name] Weeks in Treatment: 8 Active Problems Location of Pain Severity and Description of Pain Patient Has Paino No Site Locations Rate the pain. Current Pain Level: 0 Pain Management and Medication Current Pain Management: Electronic Signature(s) Signed: 11/30/2022 2:29:22 PM By: [name] [name] [name] Entered  By: [name] [name] [name] on 11/30/2022 09:27:22 -------------------------------------------------------------------------------- Patient/Caregiver Education Details Patient Name: Date of Service: [name] [name] [name], [name] [name] [name] 1/4/2024andnbsp9:15 Platinum Record Number: [id number] Patient Account Number: [id number] Date of Birth/Gender: Treating RN: [birth year] (77 y.o. [name] [name] [name] Primary Care Physician: [name] Mallick, MA [name] Other Clinician: Referring Physician: Treating Physician/Extender: [name] Door, MA [name] Weeks in Treatment: 8 Education Assessment Education Provided To: Patient Education Topics Provided Wound Debridement: Methods: Explain/Verbal Responses: Reinforcements needed, State content correctly Electronic Signature(s) Signed: 11/30/2022 2:29:22 PM By: [name] [name] [name] Entered By: [name] [name] [name] on 11/30/2022 09:40:24 [name], [name] [name] [name] ([id number]) [id number] Page 6 of 7 -------------------------------------------------------------------------------- Wound Assessment Details Patient Name: Date of Service: [name], [name] [name] 11/30/2022 9:15 A M Medical Record Number: [id number] Patient Account Number: [id number] Date of Birth/Sex: Treating RN: [birth year] (77 y.o. [name] [name] [name] Primary Care Lodie Waheed: [name] Mallick, MA [name] Other Clinician: Referring Apryl Brymer: Treating Parks Czajkowski/Extender: [name] Door, MA [name] Weeks in Treatment: 8 Wound Status Wound Number: 6 Primary Venous Leg Ulcer Etiology: Wound Location: Right, Lateral Ankle Wound Open Wounding Event: Gradually Appeared Status: Date Acquired: 09/25/2022 Comorbid Cataracts, Anemia, Lymphedema, Congestive Heart Failure, Weeks Of Treatment: 8 History: Hypertension, Peripheral Venous Disease, Osteoarthritis, Clustered Wound: No Neuropathy, Confinement Anxiety Photos Wound Measurements Length: (cm) 1.2 Width: (cm) 1.3 Depth: (cm) 0.2 Area:  (cm) 1.225 Volume: (cm) 0.245 % Reduction in Area: -18.1% % Reduction in Volume: -135.6% Epithelialization: Small (1-33%) Tunneling: No Undermining: No Wound Description Classification: Full Thickness Without Exposed Support Wound Margin: Flat and Intact Exudate Amount: Medium Exudate Type: Serous Exudate Color: amber Structures Foul Odor After Cleansing: No Slough/Fibrino Yes Wound Bed Granulation Amount: Medium (34-66%) Exposed Structure Granulation Quality: Pink Fascia Exposed: No Necrotic Amount: Medium (34-66%) Fat Layer (Subcutaneous Tissue) Exposed: Yes Necrotic Quality: Adherent Slough Tendon Exposed: No Muscle Exposed: No Joint Exposed: No Bone Exposed: No Periwound Skin Texture Texture Color No Abnormalities Noted: No No Abnormalities Noted: No Erythema: Yes Moisture Erythema Location: Circumferential No Abnormalities Noted: No Dry / Scaly: No Temperature / Pain Maceration: Yes Temperature: No Abnormality Tenderness on Palpation: Yes Treatment Notes Wound #6 (Ankle) Wound Laterality: Right, Lateral [name] [name] [name], [name] [name] [name] ([id number]) [id number] Page 7 of 7 Cleanser Soap and Water Discharge Instruction: May shower and wash wound with dial antibacterial soap and water prior to dressing change. Peri-Wound Care Zinc Oxide Ointment 30g tube Discharge Instruction: Apply Zinc Oxide to periwound with each dressing change Sween Lotion (Moisturizing lotion) Discharge Instruction: Apply moisturizing lotion as directed Topical Primary Dressing Endoform 2x2 in Discharge Instruction: Moisten with saline Secondary Dressing ABD Pad, 8x10 Discharge Instruction: Apply over primary dressing as directed. Secured With Transpore Surgical Tape, 2x10 (in/yd) Discharge Instruction: Secure dressing with tape as directed. Compression Wrap ThreePress (3 layer compression wrap) Discharge Instruction: Apply three layer compression as  directed. Compression Stockings Add-Ons Electronic Signature(s) Signed: 11/30/2022 2:29:22 PM By: [name] [name] [name] Entered By: [name] [name] [name] on 11/30/2022 09:37:15 -------------------------------------------------------------------------------- Vitals Details Patient Name: Date of Service: [name] [name] [name], [place]. 11/30/2022 9:15 A M Medical Record Number: [id number]  Patient Account Number: [id number] Date of Birth/Sex: Treating RN: [birth year][name] (77 y.o. [name] Primary Care Ardon Franklin: [name] Grammes, MA [name] Other Clinician: Referring Teyla Skidgel: Treating Asencion Loveday/Extender: [name] Skains, MA [name] Weeks in Treatment: 8 Vital Signs Time Taken: 09:27 Temperature (F): 97.7 Height (in): 65 Pulse (bpm): 66 Weight (lbs): 229 Respiratory Rate (breaths/min): 18 Body Mass Index (BMI): 38.1 Blood Pressure (mmHg): 171/84 Reference Range: 80 - 120 mg / dl Electronic Signature(s) Signed: 11/30/2022 2:29:22 PM By: Samuella Bruin Entered By: Samuella Bruin on 11/30/2022 08:67:61

## 2022-11-30 NOTE — Assessment & Plan Note (Signed)
This pleasant 77 year old female goes to wound care, she is here for bilateral knee arthrocentesis however she does have what appears to be a cellulitis left worse than right bilateral lower extremities with induration, erythema and warmth. I think it is probably wise to not inject her knees through this due to the potential of inoculating bacteria into her joint and creating a septic joint. I will do a course of doxycycline, she can follow-up after a trip to [facility] for approximately 2 to 3 weeks and if things look better I am happy to do the aspiration/injection bilaterally. Follow-up needs to be with PCP.

## 2022-11-30 NOTE — Progress Notes (Signed)
[name], [name] ([id number]) [id number] Page 1 of 11 Visit Report for 11/30/2022 Chief Complaint Document Details Patient Name: Date of Service: [name], [name]. 11/30/2022 9:15 A M Medical Record Number: [id number] Patient Account Number: [id number] Date of Birth/Sex: Treating RN: [birth year] (77 y.o. F) Primary Care Provider: Driscilla GrammesA BO NZA, MA RITZA Other Clinician: Referring Provider: Treating Provider/Extender: Hulen Skainsannon, Preeya Cleckley A BO NZA, MA RITZA Weeks in Treatment: 8 Information Obtained from: Patient Chief Complaint Bilateral LE Ulcers 10/05/2022: RLE ulcer Electronic Signature(s) Signed: 11/30/2022 9:44:32 AM By: Duanne Guessannon, Ailey Wessling MD FACS Entered By: Duanne Guessannon, Charlena Haub on 11/30/2022 09:44:32 -------------------------------------------------------------------------------- Debridement Details Patient Name: Date of Service: [name], [name]. 11/30/2022 9:15 A M Medical Record Number: [id number] Patient Account Number: [id number] Date of Birth/Sex: Treating RN: [birth year] (77 y.o. [name] PhenixF) Herrington, Taylor Primary Care Provider: Driscilla GrammesA BO NZA, MA RITZA Other Clinician: Referring Provider: Treating Provider/Extender: Hulen Skainsannon, Eurydice Calixto A BO NZA, MA RITZA Weeks in Treatment: 8 Debridement Performed for Assessment: Wound #6 Right,Lateral Ankle Performed By: Physician Duanne Guessannon, Allesha Aronoff, MD Debridement Type: Debridement Severity of Tissue Pre Debridement: Fat layer exposed Level of Consciousness (Pre-procedure): Awake and Alert Pre-procedure Verification/Time Out Yes - 09:40 Taken: Start Time: 09:40 Pain Control: Lidocaine 4% T opical Solution T Area Debrided (L x W): otal 1.2 (cm) x 1.3 (cm) = 1.56 (cm) Tissue and other material debrided: Non-Viable, Slough, Slough Level: Non-Viable Tissue Debridement Description: Selective/Open Wound Instrument: Curette Bleeding: Minimum Hemostasis Achieved: Pressure Response to Treatment: Procedure was tolerated well Level of  Consciousness (Post- Awake and Alert procedure): Post Debridement Measurements of Total Wound Length: (cm) 1.2 Width: (cm) 1.3 Depth: (cm) 0.2 Volume: (cm) 0.245 Character of Wound/Ulcer Post Debridement: Improved Severity of Tissue Post Debridement: Fat layer exposed [name] ([id number]) [id number] Page 2 of 11 Post Procedure Diagnosis Same as Pre-procedure Notes scribed for Dr. Lady Garyannon by Samuella Bruinaylor Herrington, RN Electronic Signature(s) Signed: 11/30/2022 9:59:07 AM By: Duanne Guessannon, Juluis Fitzsimmons MD FACS Signed: 11/30/2022 2:29:22 PM By: Samuella BruinHerrington, Taylor Entered By: Samuella BruinHerrington, Taylor on 11/30/2022 09:42:30 -------------------------------------------------------------------------------- HPI Details Patient Name: Date of Service: [name], [name]. 11/30/2022 9:15 A M Medical Record Number: [id number] Patient Account Number: [id number] Date of Birth/Sex: Treating RN: [birth year] (77 y.o. F) Primary Care Provider: Driscilla GrammesA BO NZA, MA RITZA Other Clinician: Referring Provider: Treating Provider/Extender: Hulen Skainsannon, Lurena Naeve A BO NZA, MA RITZA Weeks in Treatment: 8 History of Present Illness HPI Description: 07/09/18 on evaluation today patient presents for bilateral lower extremity ulcers. She has a past medical history significant for chronic venous stasis, hypertension, and lower extremity edema with associated skin changes. Unfortunately she tells me that roughly 3 weeks ago she noted ulcers on the left lower extremity which started given her trouble. Subsequently she ended up proceeding with applying mupirocin ointment and was keeping this open air for the most part. She states that it would scab over somewhat and then reopened. The wounds have been training quite significantly. Fortunately there does not appear to be any evidence of overt infection there is some erythematous type skin changes but I believe this is likely stasis dermatitis nothing more. Fortunately  she has no evidence of any systemic infection. No fevers, chills, nausea, or vomiting noted at this time. She has been recommended before to wear compression stockings but she does not wear them on a regular basis. She has never been suggested to get Juxta-Lite compression wraps she was not aware of what these were. Patient had venous studies performed on 11/09/17 which showed no lower surety DVT or deep  vein insufficiency noted. She did not have evaluation however of the superficial veins. There was full compressibility noted throughout. Her arterial study which was performed on 11/18/17 actually showed that she has an ABI on the right of 1.17 with a TBI of 1.59 along with an ABI of 1.12 on the left and a TBI of 0.99. 07/17/18 on evaluation today patient appears to be doing much better in regard to her left lower Trinity ulcers. She's been tolerating the dressing changes without complication. There does not appear to be any evidence of infection which is good news. Overall I'm very pleased with the progress at this time. 07/24/18 on evaluation today patient's wound actually appears to be completely healed at this time. Fortunately there does not appear to be any evidence of infection which is good news. Overall I'm very happy with the progress that has been made. She did get her Velcro compression wraps. READMISSION 12/31/2020 This is a patient we had in this clinic in 2019 felt to have chronic venous insufficiency wounds on her lower extremities. She was discharged with bilateral compression stockings. She tells [place] that she never really wore them or at best wore them sparingly. She is back in clinic today having a ulcer on her left lateral lower extremity since October and one on the right anterior for the last 2 weeks. I do not know that she is doing any primary dressing on these certainly not wearing any form of compression. She is already been evaluated by vein and vascular on 11/15/2020. They noted  that she did have ulcers on the left leg in October that healed with a need Unna boot. She had a reflux study on 12/20 that did not show evidence of a DVT or venous reflux bilaterally. They was not felt that she had arterial insufficiency based on the fact that she had brisk biphasic Doppler signals in the bilateral DP PT. The patient comes in today with superficial wounds on the right anterior mid tibia and the left lateral calf. These are small appear to have healthy surfaces. Past medical history includes cervical and upper thoracic spine surgery on 7/21, hernia repair early in January, hypertension, hyperlipidemia, hypothyroidism, gastric bypass surgery, DVT in the right peroneal artery and left popliteal and peroneal veins. Next problem CHF. She does not have diabetes ABIs in our clinic were 1.1 on the right and 1.2 on the lower 2/11; the patient's wound on the left lateral and right anterior lower leg are closed for edema control is good. She has an assortment of old juxta lite stockings with old liners. She does not like to wear anything in particular under the compression part of the juxta lite stocking. I think she is going to do exactly what she wants. READMISSION 10/05/2022 This is a 77 year old woman last seen in our clinic about 18 months ago. She has a history of venous stasis and stasis dermatitis. A reflux study done in 2021 did not show any evidence of venous reflux. She says about 3 weeks ago, she noticed an ulcer on her right lateral malleolus. She was applying triple antibiotic ointment to it for a while and then was soaking it in Epsom salts. She reports that she usually wears compression stockings, but that they were rubbing so she did not wear 1 on her right leg since the wound appeared, she has a stocking with the toe and heel cut out applied to her left leg. On examination, she has changes of chronic stasis dermatitis on the  right. There is a shallow ulcer overlying the  lateral malleolus. The fat layer is exposed. It is a little bit desiccated but fairly clean with just some light eschar and slough present. No malodor or purulent drainage. 10/13/2022: The wound is a little smaller today. Although the moisture balance is better than last week, it is still a bit dry. There is still some slough accumulation on the surface. EMEREE, MAHLER ([id number]) [id number] Page 3 of 11 10/25/2022: The wound continues to contract. There is still thick layer of slough on the surface. The moisture balance of the wound is better, but the periwound skin is slightly macerated. 11/02/2022: The wound is smaller and shallower today. There is still slough on the surface, but the periwound skin is intact. The wound surface is still drier than ideal. 12/15; wound is about the same size minimal depth. Under illumination some debris on the surface she has been using Prisma 11/16/2022: The leg wrap became saturated when her makeshift cast protector leaked. The wound is a little bit larger today, as result of moisture-related tissue breakdown. The surface remains fairly fibrotic. 11/30/2022: The wound measured a little bit larger today secondary to additional moisture related periwound breakdown. There is slough on the wound surface. Still fairly fibrotic underneath. Electronic Signature(s) Signed: 11/30/2022 9:45:37 AM By: Duanne Guess MD FACS Entered By: Duanne Guess on 11/30/2022 09:45:37 -------------------------------------------------------------------------------- Physical Exam Details Patient Name: Date of Service: [name], Stark Bray [name]. 11/30/2022 9:15 A M Medical Record Number: [id number] Patient Account Number: [id number] Date of Birth/Sex: Treating RN: 02/16/1946 (77 y.o. F) Primary Care Provider: Driscilla Grammes, MA RITZA Other Clinician: Referring Provider: Treating Provider/Extender: Hulen Skains, MA RITZA Weeks in Treatment:  8 Constitutional She is hypertensive, but asymptomatic.. . . . No acute distress. Respiratory Normal work of breathing on room air. Notes 11/30/2022: The wound measured a little bit larger today secondary to additional moisture related periwound breakdown. There is slough on the wound surface. Still fairly fibrotic underneath. Electronic Signature(s) Signed: 11/30/2022 9:46:04 AM By: Duanne Guess MD FACS Entered By: Duanne Guess on 11/30/2022 09:46:03 -------------------------------------------------------------------------------- Physician Orders Details Patient Name: Date of Service: [name], Stark Bray [name]. 11/30/2022 9:15 A M Medical Record Number: [id number] Patient Account Number: [id number] Date of Birth/Sex: Treating RN: August 24, 1946 (76 y.o. [name] Primary Care Provider: Driscilla Grammes, MA RITZA Other Clinician: Referring Provider: Treating Provider/Extender: Hulen Skains, MA RITZA Weeks in Treatment: 8 Verbal / Phone Orders: No Diagnosis Coding ICD-10 Coding Code Description L97.312 Non-pressure chronic ulcer of right ankle with fat layer exposed I87.2 Venous insufficiency (chronic) (peripheral) I50.32 Chronic diastolic (congestive) heart failure Aboud, Jandy [name] ([id number]) [id number] Page 4 of 11 E66.09 Other obesity due to excess calories Follow-up Appointments ppointment in 1 week. - Dr. Lady Gary Room 1 Return A Anesthetic (In clinic) Topical Lidocaine 4% applied to wound bed Bathing/ Shower/ Hygiene May shower with protection but do not get wound dressing(s) wet. Protect dressing(s) with water repellant cover (for example, large plastic bag) or a cast cover and may then take shower. Edema Control - Lymphedema / SCD / Other Bilateral Lower Extremities Elevate legs to the level of the heart or above for 30 minutes daily and/or when sitting for 3-4 times a day throughout the day. Avoid standing for long periods of  time. Patient to wear own compression stockings every day. Moisturize legs daily. Additional Orders / Instructions Wound #6 Right,Lateral Ankle Other: - Run insurance for [employer] or [employer]  Wound Treatment Wound #6 - Ankle Wound Laterality: Right, Lateral Cleanser: Soap and Water 1 x Per Week/30 Days Discharge Instructions: May shower and wash wound with dial antibacterial soap and water prior to dressing change. Peri-Wound Care: Zinc Oxide Ointment 30g tube 1 x Per Week/30 Days Discharge Instructions: Apply Zinc Oxide to periwound with each dressing change Peri-Wound Care: Sween Lotion (Moisturizing lotion) 1 x Per Week/30 Days Discharge Instructions: Apply moisturizing lotion as directed Prim Dressing: Endoform 2x2 in 1 x Per Week/30 Days ary Discharge Instructions: Moisten with saline Secondary Dressing: ABD Pad, 8x10 1 x Per Week/30 Days Discharge Instructions: Apply over primary dressing as directed. Secured With: Transpore Surgical Tape, 2x10 (in/yd) 1 x Per Week/30 Days Discharge Instructions: Secure dressing with tape as directed. Compression Wrap: ThreePress (3 layer compression wrap) 1 x Per Week/30 Days Discharge Instructions: Apply three layer compression as directed. Patient Medications llergies: cocoa, red dye, Lyrica, Sulfa (Sulfonamide Antibiotics) A Notifications Medication Indication Start End 11/30/2022 lidocaine DOSE topical 4 % cream - cream topical Electronic Signature(s) Signed: 11/30/2022 9:59:07 AM By: Duanne Guessannon, Hendy Brindle MD FACS Entered By: Duanne Guessannon, Adetokunbo Mccadden on 11/30/2022 09:46:12 -------------------------------------------------------------------------------- Problem List Details Patient Name: Date of Service: [name], [name]. 11/30/2022 9:15 A M Medical Record Number: [id number] Patient Account Number: [id number] Date of Birth/Sex: Treating RN: 1946/07/27 (77 y.o. F) Primary Care Provider: Driscilla GrammesA BO NZA, MA RITZA Other Clinician: Referring  Provider: Treating Provider/Extender: Hulen Skainsannon, Saleemah Mollenhauer A BO NZA, MA RITZA Weeks in Treatment: 8 Savitt, Claritza [name] ([id number]) [id number] Page 5 of 11 Active Problems ICD-10 Encounter Code Description Active Date MDM Diagnosis L97.312 Non-pressure chronic ulcer of right ankle with fat layer exposed 10/05/2022 No Yes I87.2 Venous insufficiency (chronic) (peripheral) 10/05/2022 No Yes I50.32 Chronic diastolic (congestive) heart failure 10/05/2022 No Yes E66.09 Other obesity due to excess calories 10/05/2022 No Yes Inactive Problems Resolved Problems Electronic Signature(s) Signed: 11/30/2022 9:44:21 AM By: Duanne Guessannon, Kazuo Durnil MD FACS Entered By: Duanne Guessannon, Jeyli Zwicker on 11/30/2022 09:44:20 -------------------------------------------------------------------------------- Progress Note Details Patient Name: Date of Service: [name], [name]. 11/30/2022 9:15 A M Medical Record Number: [id number] Patient Account Number: [id number] Date of Birth/Sex: Treating RN: 1946/07/27 (77 y.o. F) Primary Care Provider: Driscilla GrammesA BO NZA, MA RITZA Other Clinician: Referring Provider: Treating Provider/Extender: Hulen Skainsannon, Hibba Schram A BO NZA, MA RITZA Weeks in Treatment: 8 Subjective Chief Complaint Information obtained from Patient Bilateral LE Ulcers 10/05/2022: RLE ulcer History of Present Illness (HPI) 07/09/18 on evaluation today patient presents for bilateral lower extremity ulcers. She has a past medical history significant for chronic venous stasis, hypertension, and lower extremity edema with associated skin changes. Unfortunately she tells me that roughly 3 weeks ago she noted ulcers on the left lower extremity which started given her trouble. Subsequently she ended up proceeding with applying mupirocin ointment and was keeping this open air for the most part. She states that it would scab over somewhat and then reopened. The wounds have been training quite significantly. Fortunately  there does not appear to be any evidence of overt infection there is some erythematous type skin changes but I believe this is likely stasis dermatitis nothing more. Fortunately she has no evidence of any systemic infection. No fevers, chills, nausea, or vomiting noted at this time. She has been recommended before to wear compression stockings but she does not wear them on a regular basis. She has never been suggested to get Juxta-Lite compression wraps she was not aware of what these were. Patient had venous studies performed on 11/09/17 which showed no  lower surety DVT or deep vein insufficiency noted. She did not have evaluation however of the superficial veins. There was full compressibility noted throughout. Her arterial study which was performed on 11/18/17 actually showed that she has an ABI on the right of 1.17 with a TBI of 1.59 along with an ABI of 1.12 on the left and a TBI of 0.99. 07/17/18 on evaluation today patient appears to be doing much better in regard to her left lower Trinity ulcers. She's been tolerating the dressing changes without complication. There does not appear to be any evidence of infection which is good news. Overall I'm very pleased with the progress at this time. 07/24/18 on evaluation today patient's wound actually appears to be completely healed at this time. Fortunately there does not appear to be any evidence of infection which is good news. Overall I'm very happy with the progress that has been made. She did get her Velcro compression wraps. READMISSION 12/31/2020 HAJER, DWYER ([id number]) [id number] Page 6 of 11 This is a patient we had in this clinic in 2019 felt to have chronic venous insufficiency wounds on her lower extremities. She was discharged with bilateral compression stockings. She tells [place] that she never really wore them or at best wore them sparingly. She is back in clinic today having a ulcer on her left  lateral lower extremity since October and one on the right anterior for the last 2 weeks. I do not know that she is doing any primary dressing on these certainly not wearing any form of compression. She is already been evaluated by vein and vascular on 11/15/2020. They noted that she did have ulcers on the left leg in October that healed with a need Unna boot. She had a reflux study on 12/20 that did not show evidence of a DVT or venous reflux bilaterally. They was not felt that she had arterial insufficiency based on the fact that she had brisk biphasic Doppler signals in the bilateral DP PT. The patient comes in today with superficial wounds on the right anterior mid tibia and the left lateral calf. These are small appear to have healthy surfaces. Past medical history includes cervical and upper thoracic spine surgery on 7/21, hernia repair early in January, hypertension, hyperlipidemia, hypothyroidism, gastric bypass surgery, DVT in the right peroneal artery and left popliteal and peroneal veins. Next problem CHF. She does not have diabetes ABIs in our clinic were 1.1 on the right and 1.2 on the lower 2/11; the patient's wound on the left lateral and right anterior lower leg are closed for edema control is good. She has an assortment of old juxta lite stockings with old liners. She does not like to wear anything in particular under the compression part of the juxta lite stocking. I think she is going to do exactly what she wants. READMISSION 10/05/2022 This is a 78 year old woman last seen in our clinic about 18 months ago. She has a history of venous stasis and stasis dermatitis. A reflux study done in 2021 did not show any evidence of venous reflux. She says about 3 weeks ago, she noticed an ulcer on her right lateral malleolus. She was applying triple antibiotic ointment to it for a while and then was soaking it in Epsom salts. She reports that she usually wears compression stockings, but that  they were rubbing so she did not wear 1 on her right leg since the wound appeared, she has a stocking with the toe and heel cut out applied  to her left leg. On examination, she has changes of chronic stasis dermatitis on the right. There is a shallow ulcer overlying the lateral malleolus. The fat layer is exposed. It is a little bit desiccated but fairly clean with just some light eschar and slough present. No malodor or purulent drainage. 10/13/2022: The wound is a little smaller today. Although the moisture balance is better than last week, it is still a bit dry. There is still some slough accumulation on the surface. 10/25/2022: The wound continues to contract. There is still thick layer of slough on the surface. The moisture balance of the wound is better, but the periwound skin is slightly macerated. 11/02/2022: The wound is smaller and shallower today. There is still slough on the surface, but the periwound skin is intact. The wound surface is still drier than ideal. 12/15; wound is about the same size minimal depth. Under illumination some debris on the surface she has been using Prisma 11/16/2022: The leg wrap became saturated when her makeshift cast protector leaked. The wound is a little bit larger today, as result of moisture-related tissue breakdown. The surface remains fairly fibrotic. 11/30/2022: The wound measured a little bit larger today secondary to additional moisture related periwound breakdown. There is slough on the wound surface. Still fairly fibrotic underneath. Patient History Information obtained from Patient. Family History Cancer - Mother,Father, Diabetes - Father, Heart Disease - Father, Hypertension - Father, Stroke - Mother, No family history of Hereditary Spherocytosis, Kidney Disease, Lung Disease, Seizures, Thyroid Problems, Tuberculosis. Social History Never smoker, Marital Status - Married, Alcohol Use - Daily - 1.5 bottles of wine daily, Drug Use - No History,  Caffeine Use - Daily - coffee. Medical History Eyes Patient has history of Cataracts - mild Denies history of Glaucoma, Optic Neuritis Ear/Nose/Mouth/Throat Denies history of Chronic sinus problems/congestion, Middle ear problems Hematologic/Lymphatic Patient has history of Anemia - iron deficiency, Lymphedema Denies history of Hemophilia, Human Immunodeficiency Virus, Sickle Cell Disease Respiratory Denies history of Aspiration, Asthma, Chronic Obstructive Pulmonary Disease (COPD), Pneumothorax, Sleep Apnea, Tuberculosis Cardiovascular Patient has history of Congestive Heart Failure, Hypertension, Peripheral Venous Disease Denies history of Angina, Arrhythmia, Coronary Artery Disease, Deep Vein Thrombosis, Hypotension, Myocardial Infarction, Peripheral Arterial Disease, Phlebitis, Vasculitis Gastrointestinal Denies history of Cirrhosis , Colitis, Crohnoos, Hepatitis A, Hepatitis B, Hepatitis C Endocrine Denies history of Type I Diabetes, Type II Diabetes Genitourinary Denies history of End Stage Renal Disease Immunological Denies history of Lupus Erythematosus, Raynaudoos, Scleroderma Integumentary (Skin) Denies history of History of Burn Musculoskeletal Patient has history of Osteoarthritis Denies history of Gout, Rheumatoid Arthritis, Osteomyelitis Neurologic Patient has history of Neuropathy - bila Denies history of Dementia, Quadriplegia, Paraplegia, Seizure Disorder Oncologic Denies history of Received Chemotherapy, Received Radiation Psychiatric Patient has history of Confinement Anxiety Denies history of Raiya Stainback, Geet [name] ([id number]) [id number] Page 7 of 11 Hospitalization/Surgery History - right arm surgery. - gastric bypass. - hysterectomy. - tonsillectomy. - hernia repair. - cervical myelopathy with spinal cord compression. Medical A Surgical History Notes nd Constitutional Symptoms (General  Health) obesity Ear/Nose/Mouth/Throat seasonal allergies Cardiovascular aortic murmur, hyperlipidemia, hypertriglyceridemia Gastrointestinal UGI bleed, Endocrine hypothyroidism Objective Constitutional She is hypertensive, but asymptomatic.Marland Kitchen No acute distress. Vitals Time Taken: 9:27 AM, Height: 65 in, Weight: 229 lbs, BMI: 38.1, Temperature: 97.7 F, Pulse: 66 bpm, Respiratory Rate: 18 breaths/min, Blood Pressure: 171/84 mmHg. Respiratory Normal work of breathing on room air. General Notes: 11/30/2022: The wound measured a little bit larger today secondary to additional moisture related periwound breakdown. There  is slough on the wound surface. Still fairly fibrotic underneath. Integumentary (Hair, Skin) Wound #6 status is Open. Original cause of wound was Gradually Appeared. The date acquired was: 09/25/2022. The wound has been in treatment 8 weeks. The wound is located on the Right,Lateral Ankle. The wound measures 1.2cm length x 1.3cm width x 0.2cm depth; 1.225cm^2 area and 0.245cm^3 volume. There is Fat Layer (Subcutaneous Tissue) exposed. There is no tunneling or undermining noted. There is a medium amount of serous drainage noted. The wound margin is flat and intact. There is medium (34-66%) pink granulation within the wound bed. There is a medium (34-66%) amount of necrotic tissue within the wound bed including Adherent Slough. The periwound skin appearance exhibited: Maceration, Erythema. The periwound skin appearance did not exhibit: Dry/Scaly. The surrounding wound skin color is noted with erythema which is circumferential. Periwound temperature was noted as No Abnormality. The periwound has tenderness on palpation. Assessment Active Problems ICD-10 Non-pressure chronic ulcer of right ankle with fat layer exposed Venous insufficiency (chronic) (peripheral) Chronic diastolic (congestive) heart failure Other obesity due to excess calories Procedures Wound #6 Pre-procedure  diagnosis of Wound #6 is a Venous Leg Ulcer located on the Right,Lateral Ankle .Severity of Tissue Pre Debridement is: Fat layer exposed. There was a Selective/Open Wound Non-Viable Tissue Debridement with a total area of 1.56 sq cm performed by Fredirick Maudlin, MD. With the following instrument(s): Curette to remove Non-Viable tissue/material. Material removed includes [facility] after achieving pain control using Lidocaine 4% Topical Solution. No specimens were taken. A time out was conducted at 09:40, prior to the start of the procedure. A Minimum amount of bleeding was controlled with Pressure. The procedure was tolerated well. Post Debridement Measurements: 1.2cm length x 1.3cm width x 0.2cm depth; 0.245cm^3 volume. Character of Wound/Ulcer Post Debridement is improved. Severity of Tissue Post Debridement is: Fat layer exposed. Post procedure Diagnosis Wound #6: Same as Pre-Procedure General Notes: scribed for Dr. Celine Ahr by Adline Peals, RN. Pre-procedure diagnosis of Wound #6 is a Venous Leg Ulcer located on the Right,Lateral Ankle . There was a Three Layer Compression Therapy Procedure by Adline Peals, RN. Post procedure Diagnosis Wound #6: Same as Pre-Procedure KEALEY, KEMMER [name] ([id number]) [phone].pdf Page 8 of 11 Plan Follow-up Appointments: Return Appointment in 1 week. - Dr. Celine Ahr Room 1 Anesthetic: (In clinic) Topical Lidocaine 4% applied to wound bed Bathing/ Shower/ Hygiene: May shower with protection but do not get wound dressing(s) wet. Protect dressing(s) with water repellant cover (for example, large plastic bag) or a cast cover and may then take shower. Edema Control - Lymphedema / SCD / Other: Elevate legs to the level of the heart or above for 30 minutes daily and/or when sitting for 3-4 times a day throughout the day. Avoid standing for long periods of time. Patient to wear own compression stockings every day. Moisturize legs  daily. Additional Orders / Instructions: Wound #6 Right,Lateral Ankle: Other: - Run insurance for [employer] or [employer] The following medication(s) was prescribed: lidocaine topical 4 % cream cream topical was prescribed at facility WOUND #6: - Ankle Wound Laterality: Right, Lateral Cleanser: Soap and Water 1 x Per Week/30 Days Discharge Instructions: May shower and wash wound with dial antibacterial soap and water prior to dressing change. Peri-Wound Care: Zinc Oxide Ointment 30g tube 1 x Per Week/30 Days Discharge Instructions: Apply Zinc Oxide to periwound with each dressing change Peri-Wound Care: Sween Lotion (Moisturizing lotion) 1 x Per Week/30 Days Discharge Instructions: Apply moisturizing lotion as directed Prim Dressing:  Endoform 2x2 in 1 x Per Week/30 Days ary Discharge Instructions: Moisten with saline Secondary Dressing: ABD Pad, 8x10 1 x Per Week/30 Days Discharge Instructions: Apply over primary dressing as directed. Secured With: Transpore Surgical T ape, 2x10 (in/yd) 1 x Per Week/30 Days Discharge Instructions: Secure dressing with tape as directed. Com pression Wrap: ThreePress (3 layer compression wrap) 1 x Per Week/30 Days Discharge Instructions: Apply three layer compression as directed. 11/30/2022: The wound measured a little bit larger today secondary to additional moisture related periwound breakdown. There is slough on the wound surface. Still fairly fibrotic underneath. I used a curette to debride slough from the wound surface. We will continue to use endoform to the wound but will apply zinc oxide to the periwound to try and reduce the risk of further moisture related breakdown. Continue 3 layer compression. Follow-up in 1 week. Electronic Signature(s) Signed: 11/30/2022 9:59:07 AM By: Fredirick Maudlin MD FACS Signed: 11/30/2022 2:29:22 PM By: Adline Peals Previous Signature: 11/30/2022 9:46:40 AM Version By: Fredirick Maudlin MD FACS Entered By: Adline Peals on 11/30/2022 09:55:48 -------------------------------------------------------------------------------- HxROS Details Patient Name: Date of Service: [name], [place]. 11/30/2022 9:15 A M Medical Record Number: [id number] Patient Account Number: [id number] Date of Birth/Sex: Treating RN: 1946/05/25 (77 y.o. F) Primary Care Provider: Milus Mallick, MA RITZA Other Clinician: Referring Provider: Treating Provider/Extender: Terese Door, MA RITZA Weeks in Treatment: 8 Information Obtained From Patient Constitutional Symptoms (General Health) Medical History: Past Medical History Notes: obesity Eyes Medical History: Positive for: Cataracts - mild Negative for: Glaucoma; Optic Neuritis SUEELLEN, KAYES ([id number]) [phone].pdf Page 9 of 11 Ear/Nose/Mouth/Throat Medical History: Negative for: Chronic sinus problems/congestion; Middle ear problems Past Medical History Notes: seasonal allergies Hematologic/Lymphatic Medical History: Positive for: Anemia - iron deficiency; Lymphedema Negative for: Hemophilia; Human Immunodeficiency Virus; Sickle Cell Disease Respiratory Medical History: Negative for: Aspiration; Asthma; Chronic Obstructive Pulmonary Disease (COPD); Pneumothorax; Sleep Apnea; Tuberculosis Cardiovascular Medical History: Positive for: Congestive Heart Failure; Hypertension; Peripheral Venous Disease Negative for: Angina; Arrhythmia; Coronary Artery Disease; Deep Vein Thrombosis; Hypotension; Myocardial Infarction; Peripheral Arterial Disease; Phlebitis; Vasculitis Past Medical History Notes: aortic murmur, hyperlipidemia, hypertriglyceridemia Gastrointestinal Medical History: Negative for: Cirrhosis ; Colitis; Crohns; Hepatitis A; Hepatitis B; Hepatitis C Past Medical History Notes: UGI bleed, Endocrine Medical History: Negative for: Type I Diabetes; Type II Diabetes Past Medical History  Notes: hypothyroidism Genitourinary Medical History: Negative for: End Stage Renal Disease Immunological Medical History: Negative for: Lupus Erythematosus; Raynauds; Scleroderma Integumentary (Skin) Medical History: Negative for: History of Burn Musculoskeletal Medical History: Positive for: Osteoarthritis Negative for: Gout; Rheumatoid Arthritis; Osteomyelitis Neurologic Medical History: Positive for: Neuropathy - bila Negative for: Dementia; Quadriplegia; Paraplegia; Seizure Disorder Oncologic Medical History: Negative for: Received Chemotherapy; Received Radiation Psychiatric Medical History: Positive for: Confinement Anxiety Negative forJENNIFFER, VESSELS ([id number]) [id number] Page 10 of 11 HBO Extended History Items Eyes: Cataracts Immunizations Pneumococcal Vaccine: Received Pneumococcal Vaccination: No Implantable Devices No devices added Hospitalization / Surgery History Type of Hospitalization/Surgery right arm surgery gastric bypass hysterectomy tonsillectomy hernia repair cervical myelopathy with spinal cord compression Family and Social History Cancer: Yes - Mother,Father; Diabetes: Yes - Father; Heart Disease: Yes - Father; Hereditary Spherocytosis: No; Hypertension: Yes - Father; Kidney Disease: No; Lung Disease: No; Seizures: No; Stroke: Yes - Mother; Thyroid Problems: No; Tuberculosis: No; Never smoker; Marital Status - Married; Alcohol Use: Daily - 1.5 bottles of wine daily; Drug Use: No History; Caffeine Use: Daily - coffee; Financial Concerns: No; Food, Clothing  or Shelter Needs: No; Support System Lacking: No; Transportation Concerns: No Electronic Signature(s) Signed: 11/30/2022 9:59:07 AM By: Duanne Guessannon, Exander Shaul MD FACS Entered By: Duanne Guessannon, Zakai Gonyea on 11/30/2022 09:45:43 -------------------------------------------------------------------------------- SuperBill Details Patient Name: Date of  Service: [name], Valera [name]. 11/30/2022 Medical Record Number: [id number] Patient Account Number: [id number] Date of Birth/Sex: Treating RN: January 06, 1946 (77 y.o. F) Primary Care Provider: Driscilla GrammesA BO NZA, MA RITZA Other Clinician: Referring Provider: Treating Provider/Extender: Hulen Skainsannon, Kendal Ghazarian A BO NZA, MA RITZA Weeks in Treatment: 8 Diagnosis Coding ICD-10 Codes Code Description L97.312 Non-pressure chronic ulcer of right ankle with fat layer exposed I87.2 Venous insufficiency (chronic) (peripheral) I50.32 Chronic diastolic (congestive) heart failure E66.09 Other obesity due to excess calories Facility Procedures : CPT4 Code: [id number] Description: 97597 - DEBRIDE WOUND 1ST 20 SQ CM OR < ICD-10 Diagnosis Description L97.312 Non-pressure chronic ulcer of right ankle with fat layer exposed Modifier: Quantity: 1 Physician Procedures : CPT4 Code Description Modifier [id number] 99213 - WC PHYS LEVEL 3 - EST PT 25 ICD-10 Diagnosis Description L97.312 Non-pressure chronic ulcer of right ankle with fat layer exposed I87.2 Venous insufficiency (chronic) (peripheral) I50.32 Chronic diastolic  (congestive) heart failure Apel, Anjenette [name] ([id number]) [id number] E66.09 Other obesity due to excess calories Quantity: 1 .pdf Page 11 of 11 : [id number] 97597 - WC PHYS DEBR WO ANESTH 20 SQ CM 1 ICD-10 Diagnosis Description L97.312 Non-pressure chronic ulcer of right ankle with fat layer exposed Quantity: Electronic Signature(s) Signed: 11/30/2022 9:46:54 AM By: Duanne Guessannon, Jewell Ryans MD FACS Entered By: Duanne Guessannon, Laura-Lee Villegas on 11/30/2022 09:46:53

## 2022-11-30 NOTE — Progress Notes (Signed)
    Procedures performed today:    None.  Independent interpretation of notes and tests performed by another provider:   None.  Brief History, Exam, Impression, and Recommendations:    Bilateral lower leg cellulitis This pleasant 77 year old female goes to wound care, she is here for bilateral knee arthrocentesis however she does have what appears to be a cellulitis left worse than right bilateral lower extremities with induration, erythema and warmth. I think it is probably wise to not inject her knees through this due to the potential of inoculating bacteria into her joint and creating a septic joint. I will do a course of doxycycline, she can follow-up after a trip to [facility] for approximately 2 to 3 weeks and if things look better I am happy to do the aspiration/injection bilaterally. Follow-up needs to be with PCP.  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    ____________________________________________ Gwen Her. Dianah Field, M.D., ABFM., CAQSM., AME. Primary Care and Sports Medicine [facility]  Adjunct Professor of [employer]  Risk manager

## 2022-11-30 NOTE — Assessment & Plan Note (Signed)
She does have fear of flying, is between PCPs, does really well with Ativan 2 mg prior to flights, I will send a few of these but she understands she needs to get more from her PCP if needed.

## 2022-12-04 ENCOUNTER — Ambulatory Visit: Payer: PPO | Admitting: Neurology

## 2022-12-05 ENCOUNTER — Encounter (HOSPITAL_BASED_OUTPATIENT_CLINIC_OR_DEPARTMENT_OTHER): Payer: PPO | Admitting: General Surgery

## 2022-12-05 DIAGNOSIS — I872 Venous insufficiency (chronic) (peripheral): Secondary | ICD-10-CM | POA: Diagnosis not present

## 2022-12-05 DIAGNOSIS — L97312 Non-pressure chronic ulcer of right ankle with fat layer exposed: Secondary | ICD-10-CM | POA: Diagnosis not present

## 2022-12-05 NOTE — Progress Notes (Signed)
[name] ([id number]) [id number] Page 1 of 12 Visit Report for 12/05/2022 Chief Complaint Document Details Patient Name: Date of Service: [name], [name] 12/05/2022 1:15 PM Medical Record Number: [id number] Patient Account Number: [id number] Date of Birth/Sex: Treating RN: November 25, 1946 (77 y.o. F) Primary Care Provider: Driscilla Grammes, MA [name] Other Clinician: Referring Provider: Treating Provider/Extender: Hulen Skains, MA [name] Weeks in Treatment: 8 Information Obtained from: Patient Chief Complaint Bilateral LE Ulcers 10/05/2022: RLE ulcer Electronic Signature(s) Signed: 12/05/2022 2:28:18 PM By: Duanne Guess MD FACS Entered By: Duanne Guess on 12/05/2022 14:28:17 -------------------------------------------------------------------------------- Cellular or Tissue Based Product Details Patient Name: Date of Service: [name], [name] Bray H. 12/05/2022 1:15 PM Medical Record Number: [id number] Patient Account Number: [id number] Date of Birth/Sex: Treating RN: 07-03-1946 (76 y.o. [name] Primary Care Provider: Driscilla Grammes, MA [name] Other Clinician: Referring Provider: Treating Provider/Extender: Hulen Skains, MA [name] Weeks in Treatment: 8 Cellular or Tissue Based Product Type Wound #6 Right,Lateral Ankle Applied to: Performed By: Physician Duanne Guess, MD Cellular or Tissue Based Product Type: Apligraf Level of Consciousness (Pre-procedure): Awake and Alert Pre-procedure Verification/Time Out Yes - 13:50 Taken: Location: genitalia / hands / feet / multiple digits Wound Size (sq cm): 1.2 Product Size (sq cm): 44 Waste Size (sq cm): 39 Waste Reason: size of wound Amount of Product Applied (sq cm): 5 Instrument Used: Forceps, Scissors Lot #: [id number] Order #: 1 Expiration Date: 12/07/2022 Fenestrated: No Reconstituted: Yes Solution Type: Normal Saline Solution Amount: 10 Lot #:  [id number] Solution Expiration Date: 04/26/2025 Secured: Yes Secured With: Steri-Strips Dressing Applied: Yes Primary Dressing: Adaptic,gauze Procedural PainRASHUNDA, [name] ([id number]) [id number] Page 2 of 12 Post Procedural Pain: 0 Response to Treatment: Procedure was tolerated well Level of Consciousness (Post- Awake and Alert procedure): Post Procedure Diagnosis Same as Pre-procedure Notes Scribed for Dr. Lady Gary by J.Scotton Electronic Signature(s) Signed: 12/05/2022 3:52:46 PM By: Duanne Guess MD FACS Signed: 12/05/2022 4:03:05 PM By: Karie Schwalbe RN Entered By: Karie Schwalbe on 12/05/2022 14:36:43 -------------------------------------------------------------------------------- Debridement Details Patient Name: Date of Service: [name], [name] 12/05/2022 1:15 PM Medical Record Number: [id number] Patient Account Number: [id number] Date of Birth/Sex: Treating RN: 1946/08/18 (77 y.o. [name] Primary Care Provider: Driscilla Grammes, MA [name] Other Clinician: Referring Provider: Treating Provider/Extender: Hulen Skains, MA [name] Weeks in Treatment: 8 Debridement Performed for Assessment: Wound #6 Right,Lateral Ankle Performed By: Physician Duanne Guess, MD Debridement Type: Debridement Severity of Tissue Pre Debridement: Fat layer exposed Level of Consciousness (Pre-procedure): Awake and Alert Pre-procedure Verification/Time Out Yes - 15:30 Taken: Start Time: 15:30 Pain Control: Lidocaine 4% T opical Solution T Area Debrided (L x W): otal 1 (cm) x 1.2 (cm) = 1.2 (cm) Tissue and other material debrided: Non-Viable, Slough, Slough Level: Non-Viable Tissue Debridement Description: Selective/Open Wound Instrument: Curette Bleeding: Minimum Hemostasis Achieved: Pressure End Time: 15:31 Procedural Pain: 0 Post Procedural Pain: 0 Response to Treatment: Procedure was tolerated well Level of Consciousness (Post-  Awake and Alert procedure): Post Debridement Measurements of Total Wound Length: (cm) 1 Width: (cm) 1.2 Depth: (cm) 0.2 Volume: (cm) 0.188 Character of Wound/Ulcer Post Debridement: Improved Severity of Tissue Post Debridement: Fat layer exposed Post Procedure Diagnosis Same as Pre-procedure Notes Scribed for Dr. Lady Gary by J.Scotton Electronic Signature(s) Signed: 12/05/2022 3:52:46 PM By: Duanne Guess MD FACS Signed: 12/05/2022 4:03:05 PM By: Karie Schwalbe RN Entered By: Karie Schwalbe on 12/05/2022 14:39:07 [name] ([id number]) [id number] Page  3 of 12 -------------------------------------------------------------------------------- HPI Details Patient Name: Date of Service: [name], [name] 12/05/2022 1:15 PM Medical Record Number: [id number] Patient Account Number: [id number] Date of Birth/Sex: Treating RN: 29-Oct-1946 (77 y.o. F) Primary Care Provider: Driscilla Grammes, MA [name] Other Clinician: Referring Provider: Treating Provider/Extender: Hulen Skains, MA [name] Weeks in Treatment: 8 History of Present Illness HPI Description: 07/09/18 on evaluation today patient presents for bilateral lower extremity ulcers. She has a past medical history significant for chronic venous stasis, hypertension, and lower extremity edema with associated skin changes. Unfortunately she tells me that roughly 3 weeks ago she noted ulcers on the left lower extremity which started given her trouble. Subsequently she ended up proceeding with applying mupirocin ointment and was keeping this open air for the most part. She states that it would scab over somewhat and then reopened. The wounds have been training quite significantly. Fortunately there does not appear to be any evidence of overt infection there is some erythematous type skin changes but I believe this is likely stasis dermatitis nothing more. Fortunately she has no evidence of any systemic  infection. No fevers, chills, nausea, or vomiting noted at this time. She has been recommended before to wear compression stockings but she does not wear them on a regular basis. She has never been suggested to get Juxta-Lite compression wraps she was not aware of what these were. Patient had venous studies performed on 11/09/17 which showed no lower surety DVT or deep vein insufficiency noted. She did not have evaluation however of the superficial veins. There was full compressibility noted throughout. Her arterial study which was performed on 11/18/17 actually showed that she has [name] ABI on the right of 1.17 with a TBI of 1.59 along with [name] ABI of 1.12 on the left and a TBI of 0.99. 07/17/18 on evaluation today patient appears to be doing much better in regard to her left lower Trinity ulcers. She's been tolerating the dressing changes without complication. There does not appear to be any evidence of infection which is good news. Overall I'm very pleased with the progress at this time. 07/24/18 on evaluation today patient's wound actually appears to be completely healed at this time. Fortunately there does not appear to be any evidence of infection which is good news. Overall I'm very happy with the progress that has been made. She did get her Velcro compression wraps. READMISSION 12/31/2020 This is a patient we had in this clinic in 2019 felt to have chronic venous insufficiency wounds on her lower extremities. She was discharged with bilateral compression stockings. She tells [place] that she never really wore them or at best wore them sparingly. She is back in clinic today having a ulcer on her left lateral lower extremity since October and one on the right anterior for the last 2 weeks. I do not know that she is doing any primary dressing on these certainly not wearing any form of compression. She is already been evaluated by vein and vascular on 11/15/2020. They noted that she did have ulcers on the left  leg in October that healed with a need Unna boot. She had a reflux study on 12/20 that did not show evidence of a DVT or venous reflux bilaterally. They was not felt that she had arterial insufficiency based on the fact that she had brisk biphasic Doppler signals in the bilateral DP PT. The patient comes in today with superficial wounds on the right anterior mid tibia and the  left lateral calf. These are small appear to have healthy surfaces. Past medical history includes cervical and upper thoracic spine surgery on 7/21, hernia repair early in January, hypertension, hyperlipidemia, hypothyroidism, gastric bypass surgery, DVT in the right peroneal artery and left popliteal and peroneal veins. Next problem CHF. She does not have diabetes ABIs in our clinic were 1.1 on the right and 1.2 on the lower 2/11; the patient's wound on the left lateral and right anterior lower leg are closed for edema control is good. She has [name] assortment of old juxta lite stockings with old liners. She does not like to wear anything in particular under the compression part of the juxta lite stocking. I think she is going to do exactly what she wants. READMISSION 10/05/2022 This is a 77 year old woman last seen in our clinic about 18 months ago. She has a history of venous stasis and stasis dermatitis. A reflux study done in 2021 did not show any evidence of venous reflux. She says about 3 weeks ago, she noticed [name] ulcer on her right lateral malleolus. She was applying triple antibiotic ointment to it for a while and then was soaking it in Epsom salts. She reports that she usually wears compression stockings, but that they were rubbing so she did not wear 1 on her right leg since the wound appeared, she has a stocking with the toe and heel cut out applied to her left leg. On examination, she has changes of chronic stasis dermatitis on the right. There is a shallow ulcer overlying the lateral malleolus. The fat layer is  exposed. It is a little bit desiccated but fairly clean with just some light eschar and slough present. No malodor or purulent drainage. 10/13/2022: The wound is a little smaller today. Although the moisture balance is better than last week, it is still a bit dry. There is still some slough accumulation on the surface. 10/25/2022: The wound continues to contract. There is still thick layer of slough on the surface. The moisture balance of the wound is better, but the periwound skin is slightly macerated. 11/02/2022: The wound is smaller and shallower today. There is still slough on the surface, but the periwound skin is intact. The wound surface is still drier than ideal. 12/15; wound is about the same size minimal depth. Under illumination some debris on the surface she has been using Prisma 11/16/2022: The leg wrap became saturated when her makeshift cast protector leaked. The wound is a little bit larger today, as result of moisture-related tissue breakdown. The surface remains fairly fibrotic. 11/30/2022: The wound measured a little bit larger today secondary to additional moisture related periwound breakdown. There is slough on the wound surface. Still fairly fibrotic underneath. 12/05/2022: The ankle wound is a little smaller but still has slough accumulation. She has been approved for Apligraf and we are going to apply that today. [name], [name] ([id number]) [id number] Page 4 of 12 Electronic Signature(s) Signed: 12/05/2022 2:28:56 PM By: Duanne Guess MD FACS Entered By: Duanne Guess on 12/05/2022 14:28:56 -------------------------------------------------------------------------------- Physical Exam Details Patient Name: Date of Service: [name], [name] 12/05/2022 1:15 PM Medical Record Number: [id number] Patient Account Number: [id number] Date of Birth/Sex: Treating RN: August 12, 1946 (77 y.o. F) Primary Care Provider: Driscilla Grammes, MA [name] Other  Clinician: Referring Provider: Treating Provider/Extender: Hulen Skains, MA [name] Weeks in Treatment: 8 Constitutional . . . . no acute distress. Respiratory Normal work of breathing on room air. Notes 12/05/2022: The ankle wound  is a little smaller but still has slough accumulation. Electronic Signature(s) Signed: 12/05/2022 2:40:01 PM By: Duanne Guess MD FACS Entered By: Duanne Guess on 12/05/2022 14:40:01 -------------------------------------------------------------------------------- Physician Orders Details Patient Name: Date of Service: [name], [name] 12/05/2022 1:15 PM Medical Record Number: [id number] Patient Account Number: [id number] Date of Birth/Sex: Treating RN: 08/31/1946 (77 y.o. [name] Primary Care Provider: Driscilla Grammes, MA [name] Other Clinician: Referring Provider: Treating Provider/Extender: Hulen Skains, MA [name] Weeks in Treatment: 8 Verbal / Phone Orders: No Diagnosis Coding ICD-10 Coding Code Description L97.312 Non-pressure chronic ulcer of right ankle with fat layer exposed I87.2 Venous insufficiency (chronic) (peripheral) I50.32 Chronic diastolic (congestive) heart failure E66.09 Other obesity due to excess calories Follow-up Appointments ppointment in 1 week. - Dr. Lady Gary Room 1 Return A Anesthetic (In clinic) Topical Lidocaine 4% applied to wound bed Cellular or Tissue Based Products Wound #6 Right,Lateral Ankle Other Cellular or Tissue Based Products Orders/Instructions: [name], [name] ([id number]) [id number] Page 5 of 12 Bathing/ Shower/ Hygiene May shower with protection but do not get wound dressing(s) wet. Protect dressing(s) with water repellant cover (for example, large plastic bag) or a cast cover and may then take shower. Edema Control - Lymphedema / SCD / Other Bilateral Lower Extremities Elevate legs to the level of the heart or above for 30  minutes daily and/or when sitting for 3-4 times a day throughout the day. Avoid standing for long periods of time. Patient to wear own compression stockings every day. Moisturize legs daily. Additional Orders / Instructions Wound #6 Right,Lateral Ankle Other: - Run insurance for Theraskin or Apligraf Wound Treatment Wound #6 - Ankle Wound Laterality: Right, Lateral Cleanser: Soap and Water 1 x Per Week/30 Days Discharge Instructions: May shower and wash wound with dial antibacterial soap and water prior to dressing change. Peri-Wound Care: Zinc Oxide Ointment 30g tube 1 x Per Week/30 Days Discharge Instructions: Apply Zinc Oxide to periwound with each dressing change Peri-Wound Care: Sween Lotion (Moisturizing lotion) 1 x Per Week/30 Days Discharge Instructions: Apply moisturizing lotion as directed Prim Dressing: Endoform 2x2 in 1 x Per Week/30 Days ary Discharge Instructions: Moisten with saline-on hold while Apligraf is being applied Prim Dressing: APLIGRAF ary 1 x Per Week/30 Days Secondary Dressing: ABD Pad, 8x10 1 x Per Week/30 Days Discharge Instructions: Apply over primary dressing as directed. Secured With: Transpore Surgical Tape, 2x10 (in/yd) 1 x Per Week/30 Days Discharge Instructions: Secure dressing with tape as directed. Compression Wrap: ThreePress (3 layer compression wrap) 1 x Per Week/30 Days Discharge Instructions: Apply three layer compression as directed. Wound #7 - Lower Leg Wound Laterality: Left, Anterior Cleanser: Soap and Water Every Other Day/30 Days Discharge Instructions: May shower and wash wound with dial antibacterial soap and water prior to dressing change. Cleanser: Wound Cleanser Every Other Day/30 Days Discharge Instructions: Cleanse the wound with wound cleanser prior to applying a clean dressing using gauze sponges, not tissue or cotton balls. Cleanser: Compression stocking Every Other Day/30 Days Discharge Instructions: Patient's own  compression Electronic Signature(s) Signed: 12/05/2022 3:52:46 PM By: Duanne Guess MD FACS Entered By: Duanne Guess on 12/05/2022 14:40:16 -------------------------------------------------------------------------------- Problem List Details Patient Name: Date of Service: [name], Sebrena H. 12/05/2022 1:15 PM Medical Record Number: [id number] Patient Account Number: [id number] Date of Birth/Sex: Treating RN: September 09, 1946 (77 y.o. F) Primary Care Provider: Driscilla Grammes, MA [name] Other Clinician: Referring Provider: Treating Provider/Extender: Hulen Skains, MA [name] Weeks in Treatment:  8 Active Problems STEFANIA, GOULART ([id number]) [id number] Page 6 of 12 ICD-10 Encounter Code Description Active Date MDM Diagnosis L97.312 Non-pressure chronic ulcer of right ankle with fat layer exposed 10/05/2022 No Yes I87.2 Venous insufficiency (chronic) (peripheral) 10/05/2022 No Yes I50.32 Chronic diastolic (congestive) heart failure 10/05/2022 No Yes E66.09 Other obesity due to excess calories 10/05/2022 No Yes Inactive Problems Resolved Problems Electronic Signature(s) Signed: 12/05/2022 2:24:18 PM By: Duanne Guess MD FACS Entered By: Duanne Guess on 12/05/2022 14:24:18 -------------------------------------------------------------------------------- Progress Note Details Patient Name: Date of Service: [name], [name] 12/05/2022 1:15 PM Medical Record Number: [id number] Patient Account Number: [id number] Date of Birth/Sex: Treating RN: 03-Sep-1946 (77 y.o. F) Primary Care Provider: Driscilla Grammes, MA [name] Other Clinician: Referring Provider: Treating Provider/Extender: Hulen Skains, MA [name] Weeks in Treatment: 8 Subjective Chief Complaint Information obtained from Patient Bilateral LE Ulcers 10/05/2022: RLE ulcer History of Present Illness (HPI) 07/09/18 on evaluation today patient presents for bilateral lower extremity ulcers. She  has a past medical history significant for chronic venous stasis, hypertension, and lower extremity edema with associated skin changes. Unfortunately she tells me that roughly 3 weeks ago she noted ulcers on the left lower extremity which started given her trouble. Subsequently she ended up proceeding with applying mupirocin ointment and was keeping this open air for the most part. She states that it would scab over somewhat and then reopened. The wounds have been training quite significantly. Fortunately there does not appear to be any evidence of overt infection there is some erythematous type skin changes but I believe this is likely stasis dermatitis nothing more. Fortunately she has no evidence of any systemic infection. No fevers, chills, nausea, or vomiting noted at this time. She has been recommended before to wear compression stockings but she does not wear them on a regular basis. She has never been suggested to get Juxta-Lite compression wraps she was not aware of what these were. Patient had venous studies performed on 11/09/17 which showed no lower surety DVT or deep vein insufficiency noted. She did not have evaluation however of the superficial veins. There was full compressibility noted throughout. Her arterial study which was performed on 11/18/17 actually showed that she has [name] ABI on the right of 1.17 with a TBI of 1.59 along with [name] ABI of 1.12 on the left and a TBI of 0.99. 07/17/18 on evaluation today patient appears to be doing much better in regard to her left lower Trinity ulcers. She's been tolerating the dressing changes without complication. There does not appear to be any evidence of infection which is good news. Overall I'm very pleased with the progress at this time. 07/24/18 on evaluation today patient's wound actually appears to be completely healed at this time. Fortunately there does not appear to be any evidence of infection which is good news. Overall I'm very happy  with the progress that has been made. She did get her Velcro compression wraps. READMISSION 12/31/2020 This is a patient we had in this clinic in 2019 felt to have chronic venous insufficiency wounds on her lower extremities. She was discharged with bilateral compression stockings. She tells [place] that she never really wore them or at best wore them sparingly. She is back in clinic today having a ulcer on her left lateral lower extremity since October and one on the right anterior for the last 2 weeks. I do not know that she is doing any primary dressing on these certainly not wearing any form  of compression. She is already been evaluated by vein and vascular on 11/15/2020. They noted that she did have ulcers on the left leg in October that healed with a need Unna boot. She had a reflux study on 12/20 that did not show evidence of a DVT or venous reflux bilaterally. They was not felt [name] PoMCCRACKEN, Cassey H ([id number]) [id number] Page 7 of 12 that she had arterial insufficiency based on the fact that she had brisk biphasic Doppler signals in the bilateral DP PT. The patient comes in today with superficial wounds on the right anterior mid tibia and the left lateral calf. These are small appear to have healthy surfaces. Past medical history includes cervical and upper thoracic spine surgery on 7/21, hernia repair early in January, hypertension, hyperlipidemia, hypothyroidism, gastric bypass surgery, DVT in the right peroneal artery and left popliteal and peroneal veins. Next problem CHF. She does not have diabetes ABIs in our clinic were 1.1 on the right and 1.2 on the lower 2/11; the patient's wound on the left lateral and right anterior lower leg are closed for edema control is good. She has [name] assortment of old juxta lite stockings with old liners. She does not like to wear anything in particular under the compression part of the juxta lite stocking. I think she is going to do  exactly what she wants. READMISSION 10/05/2022 This is a 77 year old woman last seen in our clinic about 18 months ago. She has a history of venous stasis and stasis dermatitis. A reflux study done in 2021 did not show any evidence of venous reflux. She says about 3 weeks ago, she noticed [name] ulcer on her right lateral malleolus. She was applying triple antibiotic ointment to it for a while and then was soaking it in Epsom salts. She reports that she usually wears compression stockings, but that they were rubbing so she did not wear 1 on her right leg since the wound appeared, she has a stocking with the toe and heel cut out applied to her left leg. On examination, she has changes of chronic stasis dermatitis on the right. There is a shallow ulcer overlying the lateral malleolus. The fat layer is exposed. It is a little bit desiccated but fairly clean with just some light eschar and slough present. No malodor or purulent drainage. 10/13/2022: The wound is a little smaller today. Although the moisture balance is better than last week, it is still a bit dry. There is still some slough accumulation on the surface. 10/25/2022: The wound continues to contract. There is still thick layer of slough on the surface. The moisture balance of the wound is better, but the periwound skin is slightly macerated. 11/02/2022: The wound is smaller and shallower today. There is still slough on the surface, but the periwound skin is intact. The wound surface is still drier than ideal. 12/15; wound is about the same size minimal depth. Under illumination some debris on the surface she has been using Prisma 11/16/2022: The leg wrap became saturated when her makeshift cast protector leaked. The wound is a little bit larger today, as result of moisture-related tissue breakdown. The surface remains fairly fibrotic. 11/30/2022: The wound measured a little bit larger today secondary to additional moisture related periwound  breakdown. There is slough on the wound surface. Still fairly fibrotic underneath. 12/05/2022: The ankle wound is a little smaller but still has slough accumulation. She has been approved for Apligraf and we are going to apply that today. Patient History Information obtained  from Patient. Family History Cancer - Mother,Father, Diabetes - Father, Heart Disease - Father, Hypertension - Father, Stroke - Mother, No family history of Hereditary Spherocytosis, Kidney Disease, Lung Disease, Seizures, Thyroid Problems, Tuberculosis. Social History Never smoker, Marital Status - Married, Alcohol Use - Daily - 1.5 bottles of wine daily, Drug Use - No History, Caffeine Use - Daily - coffee. Medical History Eyes Patient has history of Cataracts - mild Denies history of Glaucoma, Optic Neuritis Ear/Nose/Mouth/Throat Denies history of Chronic sinus problems/congestion, Middle ear problems Hematologic/Lymphatic Patient has history of Anemia - iron deficiency, Lymphedema Denies history of Hemophilia, Human Immunodeficiency Virus, Sickle Cell Disease Respiratory Denies history of Aspiration, Asthma, Chronic Obstructive Pulmonary Disease (COPD), Pneumothorax, Sleep Apnea, Tuberculosis Cardiovascular Patient has history of Congestive Heart Failure, Hypertension, Peripheral Venous Disease Denies history of Angina, Arrhythmia, Coronary Artery Disease, Deep Vein Thrombosis, Hypotension, Myocardial Infarction, Peripheral Arterial Disease, Phlebitis, Vasculitis Gastrointestinal Denies history of Cirrhosis , Colitis, Crohnoos, Hepatitis A, Hepatitis B, Hepatitis C Endocrine Denies history of Type I Diabetes, Type II Diabetes Genitourinary Denies history of End Stage Renal Disease Immunological Denies history of Lupus Erythematosus, Raynaudoos, Scleroderma Integumentary (Skin) Denies history of History of Burn Musculoskeletal Patient has history of Osteoarthritis Denies history of Gout, Rheumatoid  Arthritis, Osteomyelitis Neurologic Patient has history of Neuropathy - bila Denies history of Dementia, Quadriplegia, Paraplegia, Seizure Disorder Oncologic Denies history of Received Chemotherapy, Received Radiation Psychiatric Patient has history of Confinement Anxiety Denies history of Anorexia/bulimia Hospitalization/Surgery History - right arm surgery. - gastric bypass. - hysterectomy. - tonsillectomy. - hernia repair. - cervical myelopathy with spinal cord compression. Johnnette BarriosMCCRACKEN, Michelyn H ([id number]) [id number] Page 8 of 12 Medical A Surgical History Notes nd Constitutional Symptoms (General Health) obesity Ear/Nose/Mouth/Throat seasonal allergies Cardiovascular aortic murmur, hyperlipidemia, hypertriglyceridemia Gastrointestinal UGI bleed, Endocrine hypothyroidism Objective Constitutional no acute distress. Vitals Time Taken: 1:17 PM, Height: 65 in, Weight: 229 lbs, BMI: 38.1, Temperature: 97.8 F, Pulse: 73 bpm, Respiratory Rate: 18 breaths/min, Blood Pressure: 139/81 mmHg. Respiratory Normal work of breathing on room air. General Notes: 12/05/2022: The ankle wound is a little smaller but still has slough accumulation. Integumentary (Hair, Skin) Wound #6 status is Open. Original cause of wound was Gradually Appeared. The date acquired was: 09/25/2022. The wound has been in treatment 8 weeks. The wound is located on the Right,Lateral Ankle. The wound measures 1cm length x 1.2cm width x 0.2cm depth; 0.942cm^2 area and 0.188cm^3 volume. There is Fat Layer (Subcutaneous Tissue) exposed. There is no tunneling or undermining noted. There is a medium amount of serous drainage noted. The wound margin is flat and intact. There is medium (34-66%) pink granulation within the wound bed. There is a medium (34-66%) amount of necrotic tissue within the wound bed including Adherent Slough. The periwound skin appearance exhibited: Maceration, Erythema. The  periwound skin appearance did not exhibit: Dry/Scaly. The surrounding wound skin color is noted with erythema which is circumferential. Periwound temperature was noted as No Abnormality. The periwound has tenderness on palpation. Wound #7 status is Open. Original cause of wound was Gradually Appeared. The date acquired was: 12/01/2022. The wound is located on the Left,Anterior Lower Leg. The wound measures 0.2cm length x 0.1cm width x 0.1cm depth; 0.016cm^2 area and 0.002cm^3 volume. There is Fat Layer (Subcutaneous Tissue) exposed. There is no tunneling or undermining noted. There is a medium amount of serosanguineous drainage noted. There is large (67-100%) pink granulation within the wound bed. There is no necrotic tissue within the wound bed. The periwound skin appearance  had no abnormalities noted for moisture. The periwound skin appearance exhibited: Scarring, Hemosiderin Staining. Periwound temperature was noted as No Abnormality. Assessment Active Problems ICD-10 Non-pressure chronic ulcer of right ankle with fat layer exposed Venous insufficiency (chronic) (peripheral) Chronic diastolic (congestive) heart failure Other obesity due to excess calories Procedures Wound #6 Pre-procedure diagnosis of Wound #6 is a Venous Leg Ulcer located on the Right,Lateral Ankle .Severity of Tissue Pre Debridement is: Fat layer exposed. There was a Selective/Open Wound Non-Viable Tissue Debridement with a total area of 1.2 sq cm performed by Duanne Guess, MD. With the following instrument(s): Curette to remove Non-Viable tissue/material. Material removed includes [facility] after achieving pain control using Lidocaine 4% Topical Solution. No specimens were taken. A time out was conducted at 15:30, prior to the start of the procedure. A Minimum amount of bleeding was controlled with Pressure. The procedure was tolerated well with a pain level of 0 throughout and a pain level of 0 following the procedure.  Post Debridement Measurements: 1cm length x 1.2cm width x 0.2cm depth; 0.188cm^3 volume. Character of Wound/Ulcer Post Debridement is improved. Severity of Tissue Post Debridement is: Fat layer exposed. Post procedure Diagnosis Wound #6: Same as Pre-Procedure General Notes: Scribed for Dr. Lady Gary by J.Scotton. Pre-procedure diagnosis of Wound #6 is a Venous Leg Ulcer located on the Right,Lateral Ankle. A skin graft procedure using a bioengineered skin substitute/cellular or tissue based product was performed by Duanne Guess, MD with the following instrument(s): Forceps and Scissors. Apligraf was applied and secured with Steri-Strips. 5 sq cm of product was utilized and 39 sq cm was wasted due to size of wound. Post Application, Adaptic,gauze was applied. A Time Out was conducted at 13:50, prior to the start of the procedure. The procedure was tolerated well with a pain level of 0 throughout and a pain level of 0 Lookabaugh, Nancyann H ([id number]) [id number] Page 9 of 12 following the procedure. Post procedure Diagnosis Wound #6: Same as Pre-Procedure General Notes: Scribed for Dr. Lady Gary by J.Scotton. Pre-procedure diagnosis of Wound #6 is a Venous Leg Ulcer located on the Right,Lateral Ankle . There was a Three Layer Compression Therapy Procedure by Karie Schwalbe, RN. Post procedure Diagnosis Wound #6: Same as Pre-Procedure Notes: Scribed for Dr. Lady Gary. Plan Follow-up Appointments: Return Appointment in 1 week. - Dr. Lady Gary Room 1 Anesthetic: (In clinic) Topical Lidocaine 4% applied to wound bed Cellular or Tissue Based Products: Wound #6 Right,Lateral Ankle: Other Cellular or Tissue Based Products Orders/Instructions: - APLIGRAF #1 Bathing/ Shower/ Hygiene: May shower with protection but do not get wound dressing(s) wet. Protect dressing(s) with water repellant cover (for example, large plastic bag) or a cast cover and may then take shower. Edema Control  - Lymphedema / SCD / Other: Elevate legs to the level of the heart or above for 30 minutes daily and/or when sitting for 3-4 times a day throughout the day. Avoid standing for long periods of time. Patient to wear own compression stockings every day. Moisturize legs daily. Additional Orders / Instructions: Wound #6 Right,Lateral Ankle: Other: - Run insurance for [employer] or Apligraf WOUND #6: - Ankle Wound Laterality: Right, Lateral Cleanser: Soap and Water 1 x Per Week/30 Days Discharge Instructions: May shower and wash wound with dial antibacterial soap and water prior to dressing change. Peri-Wound Care: Zinc Oxide Ointment 30g tube 1 x Per Week/30 Days Discharge Instructions: Apply Zinc Oxide to periwound with each dressing change Peri-Wound Care: Sween Lotion (Moisturizing lotion) 1 x Per Week/30 Days Discharge Instructions:  Apply moisturizing lotion as directed Prim Dressing: Endoform 2x2 in 1 x Per Week/30 Days ary Discharge Instructions: Moisten with saline-on hold while Apligraf is being applied Prim Dressing: APLIGRAF 1 x Per Week/30 Days ary Secondary Dressing: ABD Pad, 8x10 1 x Per Week/30 Days Discharge Instructions: Apply over primary dressing as directed. Secured With: Transpore Surgical T ape, 2x10 (in/yd) 1 x Per Week/30 Days Discharge Instructions: Secure dressing with tape as directed. Com pression Wrap: ThreePress (3 layer compression wrap) 1 x Per Week/30 Days Discharge Instructions: Apply three layer compression as directed. WOUND #7: - Lower Leg Wound Laterality: Left, Anterior Cleanser: Soap and Water Every Other Day/30 Days Discharge Instructions: May shower and wash wound with dial antibacterial soap and water prior to dressing change. Cleanser: Wound Cleanser Every Other Day/30 Days Discharge Instructions: Cleanse the wound with wound cleanser prior to applying a clean dressing using gauze sponges, not tissue or cotton balls. Cleanser: Compression stocking  Every Other Day/30 Days Discharge Instructions: Patient's own compression 12/05/2022: The ankle wound is a little smaller but still has slough accumulation. I used a curette to debride the slough from the wound. I then fenestrated Apligraf #1 and applied in standard fashion to the wound. The excess was trimmed. It was secured in place with Adaptic and Steri-Strips. A gauze sponge was used as a bolster. 3 layer compression was applied. She is leaving town for 10 days and she will return for clinic visit the day after she returns. Electronic Signature(s) Signed: 12/05/2022 2:48:03 PM By: Duanne Guess MD FACS Entered By: Duanne Guess on 12/05/2022 14:48:02 -------------------------------------------------------------------------------- HxROS Details Patient Name: Date of Service: [name], Carlisia H. 12/05/2022 1:15 PM Medical Record Number: [id number] Patient Account Number: [id number] Date of Birth/Sex: Treating RN: Jun 21, 1946 (77 y.o. F) Primary Care Provider: Driscilla Grammes, MA [name] Other ClinicianRENDA, POHLMAN ([id number]) [id number] Page 10 of 12 Referring Provider: Treating Provider/Extender: Hulen Skains, MA [name] Weeks in Treatment: 8 Information Obtained From Patient Constitutional Symptoms (General Health) Medical History: Past Medical History Notes: obesity Eyes Medical History: Positive for: Cataracts - mild Negative for: Glaucoma; Optic Neuritis Ear/Nose/Mouth/Throat Medical History: Negative for: Chronic sinus problems/congestion; Middle ear problems Past Medical History Notes: seasonal allergies Hematologic/Lymphatic Medical History: Positive for: Anemia - iron deficiency; Lymphedema Negative for: Hemophilia; Human Immunodeficiency Virus; Sickle Cell Disease Respiratory Medical History: Negative for: Aspiration; Asthma; Chronic Obstructive Pulmonary Disease (COPD); Pneumothorax; Sleep Apnea;  Tuberculosis Cardiovascular Medical History: Positive for: Congestive Heart Failure; Hypertension; Peripheral Venous Disease Negative for: Angina; Arrhythmia; Coronary Artery Disease; Deep Vein Thrombosis; Hypotension; Myocardial Infarction; Peripheral Arterial Disease; Phlebitis; Vasculitis Past Medical History Notes: aortic murmur, hyperlipidemia, hypertriglyceridemia Gastrointestinal Medical History: Negative for: Cirrhosis ; Colitis; Crohns; Hepatitis A; Hepatitis B; Hepatitis C Past Medical History Notes: UGI bleed, Endocrine Medical History: Negative for: Type I Diabetes; Type II Diabetes Past Medical History Notes: hypothyroidism Genitourinary Medical History: Negative for: End Stage Renal Disease Immunological Medical History: Negative for: Lupus Erythematosus; Raynauds; Scleroderma Integumentary (Skin) Medical History: Negative for: History of Burn Musculoskeletal Medical History: Positive for: Osteoarthritis Negative for: Gout; Rheumatoid Arthritis; Osteomyelitis Bellissimo, Kalese H ([id number]) [id number] Page 11 of 12 Neurologic Medical History: Positive for: Neuropathy - bila Negative for: Dementia; Quadriplegia; Paraplegia; Seizure Disorder Oncologic Medical History: Negative for: Received Chemotherapy; Received Radiation Psychiatric Medical History: Positive for: Confinement Anxiety Negative for: Anorexia/bulimia HBO Extended History Items Eyes: Cataracts Immunizations Pneumococcal Vaccine: Received Pneumococcal Vaccination: No Implantable Devices No devices added Hospitalization / Surgery History Type of  Hospitalization/Surgery right arm surgery gastric bypass hysterectomy tonsillectomy hernia repair cervical myelopathy with spinal cord compression Family and Social History Cancer: Yes - Mother,Father; Diabetes: Yes - Father; Heart Disease: Yes - Father; Hereditary Spherocytosis: No; Hypertension: Yes - Father;  Kidney Disease: No; Lung Disease: No; Seizures: No; Stroke: Yes - Mother; Thyroid Problems: No; Tuberculosis: No; Never smoker; Marital Status - Married; Alcohol Use: Daily - 1.5 bottles of wine daily; Drug Use: No History; Caffeine Use: Daily - coffee; Financial Concerns: No; Food, Clothing or Shelter Needs: No; Support System Lacking: No; Transportation Concerns: No Electronic Signature(s) Signed: 12/05/2022 3:52:46 PM By: Fredirick Maudlin MD FACS Entered By: Fredirick Maudlin on 12/05/2022 14:39:21 -------------------------------------------------------------------------------- SuperBill Details Patient Name: Date of Service: [name], Diesha H. 12/05/2022 Medical Record Number: [id number] Patient Account Number: [id number] Date of Birth/Sex: Treating RN: Apr 11, 1946 (77 y.o. [place] Brown Primary Care Provider: Milus Mallick, MA [name] Other Clinician: Referring Provider: Treating Provider/Extender: Terese Door, MA [name] Weeks in Treatment: 8 Diagnosis Coding ICD-10 Codes Code Description C94.709 Non-pressure chronic ulcer of right ankle with fat layer exposed I87.2 Venous insufficiency (chronic) (peripheral) G28.36 Chronic diastolic (congestive) heart failure E66.09 Other obesity due to excess calories Rick, Rozelia H ([id number]) [id number] Page 12 of 12 Facility Procedures : CPT4 Code: 50354656 Description: (Facility Use Only) Apligraf 1 SQ CM Modifier: Quantity: 44 : CPT4 Code: 81275170 Description: 01749 - SKIN SUB GRAFT FACE/NK/HF/G ICD-10 Diagnosis Description S49.675 Non-pressure chronic ulcer of right ankle with fat layer exposed Modifier: Quantity: 1 Physician Procedures : CPT4 Code Description Modifier [id number] - WC PHYS LEVEL 3 - EST PT 25 ICD-10 Diagnosis Description T70.177 Non-pressure chronic ulcer of right ankle with fat layer exposed I87.2 Venous insufficiency (chronic) (peripheral) L39.03 Chronic diastolic   (congestive) heart failure Quantity: 1 : [id number] - WC PHYS SKIN SUB GRAFT FACE/NK/HF/G ICD-10 Diagnosis Description Q76.226 Non-pressure chronic ulcer of right ankle with fat layer exposed Quantity: 1 Electronic Signature(s) Signed: 12/05/2022 2:48:21 PM By: Fredirick Maudlin MD FACS Entered By: Fredirick Maudlin on 12/05/2022 14:48:21

## 2022-12-05 NOTE — Progress Notes (Signed)
[name], [name] ([id number]) [id number] Page 1 of 9 Visit Report for 12/05/2022 Arrival Information Details Patient Name: Date of Service: [name], [name] 12/05/2022 1:15 PM Medical Record Number: [id number] Patient Account Number: [id number] Date of Birth/Sex: Treating RN: [birth year] (77 y.o. F) Karie Schwalbe Primary Care Trevis Eden: Driscilla Grammes, MA RITZA Other Clinician: Referring Jamison Yuhasz: Treating Ilani Otterson/Extender: Hulen Skains, MA RITZA Weeks in Treatment: 8 Visit Information History Since Last Visit Added or deleted any medications: No Patient Arrived: Cane Any new allergies or adverse reactions: No Arrival Time: 13:17 Had a fall or experienced change in No Accompanied By: spouse activities of daily living that may affect Transfer Assistance: Manual risk of falls: Patient Identification Verified: Yes Signs or symptoms of abuse/neglect since last visito No Patient Requires Transmission-Based Precautions: No Hospitalized since last visit: No Patient Has Alerts: No Implantable device outside of the clinic excluding No cellular tissue based products placed in the center since last visit: Has Dressing in Place as Prescribed: Yes Has Compression in Place as Prescribed: Yes Pain Present Now: No Electronic Signature(s) Signed: 12/05/2022 4:03:05 PM By: Karie Schwalbe RN Entered By: Karie Schwalbe on 12/05/2022 13:17:52 -------------------------------------------------------------------------------- Compression Therapy Details Patient Name: Date of Service: [name], [name] 12/05/2022 1:15 PM Medical Record Number: [id number] Patient Account Number: [id number] Date of Birth/Sex: Treating RN: 1946-01-10 (77 y.o. Katrinka Blazing Primary Care Amarian Botero: Driscilla Grammes, MA RITZA Other Clinician: Referring Demont Linford: Treating Lamiah Marmol/Extender: Hulen Skains, MA RITZA Weeks in Treatment: 8 Compression Therapy Performed for Wound  Assessment: Wound #6 Right,Lateral Ankle Performed By: Clinician Karie Schwalbe, RN Compression Type: Three Layer Post Procedure Diagnosis Same as Pre-procedure Notes Scribed for Dr. Lady Gary Electronic Signature(s) Signed: 12/05/2022 4:03:05 PM By: Karie Schwalbe RN Entered By: Karie Schwalbe on 12/05/2022 14:37:12 [name] ([id number]) [id number] Page 2 of 9 -------------------------------------------------------------------------------- Encounter Discharge Information Details Patient Name: Date of Service: [name], [name] 12/05/2022 1:15 PM Medical Record Number: [id number] Patient Account Number: [id number] Date of Birth/Sex: Treating RN: March 03, 1946 (77 y.o. Katrinka Blazing Primary Care Ly Wass: Driscilla Grammes, MA RITZA Other Clinician: Referring Jamarcus Laduke: Treating Laray Corbit/Extender: Hulen Skains, MA RITZA Weeks in Treatment: 8 Encounter Discharge Information Items Post Procedure Vitals Discharge Condition: Stable Temperature (F): 97.8 Ambulatory Status: Cane Pulse (bpm): 73 Discharge Destination: Home Respiratory Rate (breaths/min): 18 Transportation: Private Auto Blood Pressure (mmHg): 139/81 Accompanied By: spouse Schedule Follow-up Appointment: Yes Clinical Summary of Care: Patient Declined Electronic Signature(s) Signed: 12/05/2022 4:03:05 PM By: Karie Schwalbe RN Entered By: Karie Schwalbe on 12/05/2022 14:43:25 -------------------------------------------------------------------------------- Lower Extremity Assessment Details Patient Name: Date of Service: [name], [name] 12/05/2022 1:15 PM Medical Record Number: [id number] Patient Account Number: [id number] Date of Birth/Sex: Treating RN: [birth year] (77 y.o. Katrinka Blazing Primary Care Ana Woodroof: Driscilla Grammes, MA RITZA Other Clinician: Referring Kayci Belleville: Treating Daysha Ashmore/Extender: Hulen Skains, MA RITZA Weeks in Treatment: 8 Edema  Assessment Assessed: [Left: No] [Right: No] [Left: Edema] [Right: :] Calf Left: Right: Point of Measurement: From Medial Instep 33 cm Ankle Left: Right: Point of Measurement: From Medial Instep 21.5 cm Vascular Assessment Pulses: Dorsalis Pedis Palpable: [Right:Yes] Electronic Signature(s) Signed: 12/05/2022 4:03:05 PM By: Karie Schwalbe RN Entered By: Karie Schwalbe on 12/05/2022 13:36:35 [name] ([id number]) [id number] Page 3 of 9 -------------------------------------------------------------------------------- Multi Wound Chart Details Patient Name: Date of Service: [name], [name] 12/05/2022 1:15 PM Medical Record Number: [id number] Patient Account Number: [id number] Date of Birth/Sex:  Treating RN: Jan 08, 1946 (77 y.o. F) Primary Care Laquesha Holcomb: A Kathi Ludwig, MA RITZA Other Clinician: Referring Sedalia Greeson: Treating Yahya Boldman/Extender: Hulen Skains, MA RITZA Weeks in Treatment: 8 Vital Signs Height(in): 65 Pulse(bpm): 73 Weight(lbs): 229 Blood Pressure(mmHg): 139/81 Body Mass Index(BMI): 38.1 Temperature(F): 97.8 Respiratory Rate(breaths/min): 18 [6:Photos:] [N/A:N/A] Right, Lateral Ankle Left, Anterior Lower Leg N/A Wound Location: Gradually Appeared Gradually Appeared N/A Wounding Event: Venous Leg Ulcer Venous Leg Ulcer N/A Primary Etiology: Cataracts, Anemia, Lymphedema, Cataracts, Anemia, Lymphedema, N/A Comorbid History: Congestive Heart Failure, Congestive Heart Failure, Hypertension, Peripheral Venous Hypertension, Peripheral Venous Disease, Osteoarthritis, Neuropathy, Disease, Osteoarthritis, Neuropathy, Confinement Anxiety Confinement Anxiety 09/25/2022 12/01/2022 N/A Date Acquired: 8 0 N/A Weeks of Treatment: Open Open N/A Wound Status: No No N/A Wound Recurrence: 1x1.2x0.2 0.2x0.1x0.1 N/A Measurements L x W x D (cm) 0.942 0.016 N/A A (cm) : rea 0.188 0.002 N/A Volume (cm) : 9.20% N/A N/A %  Reduction in Area: -80.80% N/A N/A % Reduction in Volume: Full Thickness Without Exposed Full Thickness Without Exposed N/A Classification: Support Structures Support Structures Medium Medium N/A Exudate Amount: Serous Serosanguineous N/A Exudate Type: amber red, brown N/A Exudate Color: Flat and Intact N/A N/A Wound Margin: Medium (34-66%) Large (67-100%) N/A Granulation Amount: Pink Pink N/A Granulation Quality: Medium (34-66%) None Present (0%) N/A Necrotic Amount: Fat Layer (Subcutaneous Tissue): Yes Fat Layer (Subcutaneous Tissue): Yes N/A Exposed Structures: Fascia: No Fascia: No Tendon: No Tendon: No Muscle: No Muscle: No Joint: No Joint: No Bone: No Bone: No Small (1-33%) None N/A Epithelialization: Scarring: Yes N/A Periwound Skin Texture: Maceration: Yes No Abnormalities Noted N/A Periwound Skin Moisture: Dry/Scaly: No Erythema: Yes Hemosiderin Staining: Yes N/A Periwound Skin Color: Circumferential N/A N/A Erythema Location: No Abnormality No Abnormality N/A Temperature: Yes N/A N/A Tenderness on Palpation: Treatment Notes Electronic Signature(s) Signed: 12/05/2022 2:28:10 PM By: Duanne Guess MD FACS [place], [place] H [phone]: Duanne Guess MD FACS [phone].pdf Page 4 of 9 Signed: 12/05/2022 2:28:10 PM Entered By: Duanne Guess on 12/05/2022 14:28:10 -------------------------------------------------------------------------------- Multi-Disciplinary Care Plan Details Patient Name: Date of Service: [name], [name] 12/05/2022 1:15 PM Medical Record Number: [id number] Patient Account Number: [id number] Date of Birth/Sex: Treating RN: 01-04-1946 (77 y.o. Katrinka Blazing Primary Care Uziel Covault: Driscilla Grammes, MA RITZA Other Clinician: Referring Arizbeth Cawthorn: Treating Glenis Musolf/Extender: Hulen Skains, MA RITZA Weeks in Treatment: 8 Active Inactive Nutrition Nursing Diagnoses: Potential for  alteratiion in Nutrition/Potential for imbalanced nutrition Goals: Patient/caregiver agrees to and verbalizes understanding of need to use nutritional supplements and/or vitamins as prescribed Date Initiated: 10/13/2022 Target Resolution Date: 02/25/2023 Goal Status: Active Interventions: Provide education on nutrition Treatment Activities: Dietary management education, guidance and counseling : 10/13/2022 Education provided on Nutrition : 11/10/2022 Notes: Wound/Skin Impairment Nursing Diagnoses: Impaired tissue integrity Goals: Ulcer/skin breakdown will have a volume reduction of 30% by week 4 Date Initiated: 10/13/2022 Target Resolution Date: 02/25/2023 Goal Status: Active Interventions: Provide education on ulcer and skin care Notes: Electronic Signature(s) Signed: 12/05/2022 4:03:05 PM By: Karie Schwalbe RN Entered By: Karie Schwalbe on 12/05/2022 14:39:30 -------------------------------------------------------------------------------- Pain Assessment Details Patient Name: Date of Service: [name], [name] 12/05/2022 1:15 PM Medical Record Number: [id number] Patient Account Number: [id number] Date of Birth/Sex: Treating RN: February 27, 1946 (77 y.o. Katrinka Blazing Primary Care Minh Jasper: Driscilla Grammes, MA RITZA Other Clinician: Referring Bela Bonaparte: Treating Chianti Goh/Extender: Hulen Skains, MA RITZA Weeks in Treatment: 8 Defilippo, Elinda H ([id number]) [id number] Page 5 of 9 Active Problems Location of Pain Severity and  Description of Pain Patient Has Paino No Site Locations Pain Management and Medication Current Pain Management: Electronic Signature(s) Signed: 12/05/2022 4:03:05 PM By: Karie Schwalbe RN Entered By: Karie Schwalbe on 12/05/2022 13:35:04 -------------------------------------------------------------------------------- Patient/Caregiver Education Details Patient Name: Date of Service: [name], Randall [name]  1/9/2024andnbsp1:15 PM Medical Record Number: [id number] Patient Account Number: [id number] Date of Birth/Gender: Treating RN: 06/10/1946 (77 y.o. Katrinka Blazing Primary Care Physician: Driscilla Grammes, MA RITZA Other Clinician: Referring Physician: Treating Physician/Extender: Hulen Skains, MA RITZA Weeks in Treatment: 8 Education Assessment Education Provided To: Patient Education Topics Provided Wound/Skin Impairment: Methods: Explain/Verbal Responses: Return demonstration correctly Electronic Signature(s) Signed: 12/05/2022 4:03:05 PM By: Karie Schwalbe RN Entered By: Karie Schwalbe on 12/05/2022 14:39:58 [name] ([id number]) [id number] Page 6 of 9 -------------------------------------------------------------------------------- Wound Assessment Details Patient Name: Date of Service: [name], [name] 12/05/2022 1:15 PM Medical Record Number: [id number] Patient Account Number: [id number] Date of Birth/Sex: Treating RN: 01-25-1946 (77 y.o. F) Karie Schwalbe Primary Care Bud Kaeser: Driscilla Grammes, MA RITZA Other Clinician: Referring Tyrese Capriotti: Treating Mercer Peifer/Extender: Hulen Skains, MA RITZA Weeks in Treatment: 8 Wound Status Wound Number: 6 Primary Venous Leg Ulcer Etiology: Wound Location: Right, Lateral Ankle Wound Open Wounding Event: Gradually Appeared Status: Date Acquired: 09/25/2022 Comorbid Cataracts, Anemia, Lymphedema, Congestive Heart Failure, Weeks Of Treatment: 8 History: Hypertension, Peripheral Venous Disease, Osteoarthritis, Clustered Wound: No Neuropathy, Confinement Anxiety Photos Wound Measurements Length: (cm) 1 Width: (cm) 1.2 Depth: (cm) 0.2 Area: (cm) 0.942 Volume: (cm) 0.188 % Reduction in Area: 9.2% % Reduction in Volume: -80.8% Epithelialization: Small (1-33%) Tunneling: No Undermining: No Wound Description Classification: Full Thickness Without Exposed Support  Structures Wound Margin: Flat and Intact Exudate Amount: Medium Exudate Type: Serous Exudate Color: amber Foul Odor After Cleansing: No Slough/Fibrino Yes Wound Bed Granulation Amount: Medium (34-66%) Exposed Structure Granulation Quality: Pink Fascia Exposed: No Necrotic Amount: Medium (34-66%) Fat Layer (Subcutaneous Tissue) Exposed: Yes Necrotic Quality: Adherent Slough Tendon Exposed: No Muscle Exposed: No Joint Exposed: No Bone Exposed: No Periwound Skin Texture Texture Color No Abnormalities Noted: No No Abnormalities Noted: No Erythema: Yes Moisture Erythema Location: Circumferential No Abnormalities Noted: No Dry / Scaly: No Temperature / Pain Maceration: Yes Temperature: No Abnormality Tenderness on Palpation: Yes Treatment Notes Wound #6 (Ankle) Wound Laterality: Right, Lateral Cleanser Soap and Water Discharge Instruction: May shower and wash wound with dial antibacterial soap and water prior to dressing change. Peri-Wound Care [place], Eunice H ([id number]) [id number] Page 7 of 9 Zinc Oxide Ointment 30g tube Discharge Instruction: Apply Zinc Oxide to periwound with each dressing change Sween Lotion (Moisturizing lotion) Discharge Instruction: Apply moisturizing lotion as directed Topical Primary Dressing Endoform 2x2 in Discharge Instruction: Moisten with saline-on hold while Apligraf is being applied APLIGRAF Secondary Dressing ABD Pad, 8x10 Discharge Instruction: Apply over primary dressing as directed. Secured With Transpore Surgical Tape, 2x10 (in/yd) Discharge Instruction: Secure dressing with tape as directed. Compression Wrap ThreePress (3 layer compression wrap) Discharge Instruction: Apply three layer compression as directed. Compression Stockings Add-Ons Electronic Signature(s) Signed: 12/05/2022 4:03:05 PM By: Karie Schwalbe RN Entered By: Karie Schwalbe on 12/05/2022  13:30:45 -------------------------------------------------------------------------------- Wound Assessment Details Patient Name: Date of Service: [name], [name] 12/05/2022 1:15 PM Medical Record Number: [id number] Patient Account Number: [id number] Date of Birth/Sex: Treating RN: 1945/12/12 (77 y.o. Katrinka Blazing Primary Care Lisandro Meggett: Driscilla Grammes, MA RITZA Other Clinician: Referring Holten Spano: Treating Curran Lenderman/Extender: Hulen Skains, MA RITZA Weeks in Treatment: 8 Wound  Status Wound Number: 7 Primary Venous Leg Ulcer Etiology: Wound Location: Left, Anterior Lower Leg Wound Open Wounding Event: Gradually Appeared Status: Date Acquired: 12/01/2022 Comorbid Cataracts, Anemia, Lymphedema, Congestive Heart Failure, Weeks Of Treatment: 0 History: Hypertension, Peripheral Venous Disease, Osteoarthritis, Clustered Wound: No Neuropathy, Confinement Anxiety Photos Wound Measurements Length: (cm) 0.2 Width: (cm) 0.1 Weldon, Dlisa H ([id number]) Depth: (cm) 0.1 Area: (cm) 0.016 Volume: (cm) 0.002 % Reduction in Area: % Reduction in Volume: [id number] Page 8 of 9 Epithelialization: None Tunneling: No Undermining: No Wound Description Classification: Full Thickness Without Exposed Support Structures Exudate Amount: Medium Exudate Type: Serosanguineous Exudate Color: red, brown Foul Odor After Cleansing: No Slough/Fibrino No Wound Bed Granulation Amount: Large (67-100%) Exposed Structure Granulation Quality: Pink Fascia Exposed: No Necrotic Amount: None Present (0%) Fat Layer (Subcutaneous Tissue) Exposed: Yes Tendon Exposed: No Muscle Exposed: No Joint Exposed: No Bone Exposed: No Periwound Skin Texture Texture Color No Abnormalities Noted: No No Abnormalities Noted: No Scarring: Yes Hemosiderin Staining: Yes Moisture Temperature / Pain No Abnormalities Noted: Yes Temperature: No Abnormality Treatment Notes Wound #7  (Lower Leg) Wound Laterality: Left, Anterior Cleanser Soap and Water Discharge Instruction: May shower and wash wound with dial antibacterial soap and water prior to dressing change. Wound Cleanser Discharge Instruction: Cleanse the wound with wound cleanser prior to applying a clean dressing using gauze sponges, not tissue or cotton balls. Compression stocking Discharge Instruction: Patient's own compression Peri-Wound Care Topical Primary Dressing Secondary Dressing Secured With Compression Wrap Compression Stockings Add-Ons Electronic Signature(s) Signed: 12/05/2022 4:03:05 PM By: Dellie Catholic RN Entered By: Dellie Catholic on 12/05/2022 13:31:07 -------------------------------------------------------------------------------- Vitals Details Patient Name: Date of Service: [name], Bayla H. 12/05/2022 1:15 PM Medical Record Number: [id number] Patient Account Number: [id number] Date of Birth/Sex: Treating RN: Oct 15, 1946 (77 y.o. America Brown Primary Care Teffany Blaszczyk: Milus Mallick, MA RITZA Other Clinician: Referring Kordell Jafri: Treating Jaslen Adcox/Extender: Terese Door, MA RITZA Weeks in Treatment: [place], Devany H ([id number]) [id number] Page 9 of 9 Vital Signs Time Taken: 13:17 Temperature (F): 97.8 Height (in): 65 Pulse (bpm): 73 Weight (lbs): 229 Respiratory Rate (breaths/min): 18 Body Mass Index (BMI): 38.1 Blood Pressure (mmHg): 139/81 Reference Range: 80 - 120 mg / dl Electronic Signature(s) Signed: 12/05/2022 4:03:05 PM By: Dellie Catholic RN Entered By: Dellie Catholic on 12/05/2022 13:34:54

## 2022-12-15 ENCOUNTER — Encounter (HOSPITAL_BASED_OUTPATIENT_CLINIC_OR_DEPARTMENT_OTHER): Payer: PPO | Admitting: General Surgery

## 2022-12-15 DIAGNOSIS — L97312 Non-pressure chronic ulcer of right ankle with fat layer exposed: Secondary | ICD-10-CM | POA: Diagnosis not present

## 2022-12-15 DIAGNOSIS — I872 Venous insufficiency (chronic) (peripheral): Secondary | ICD-10-CM | POA: Diagnosis not present

## 2022-12-15 NOTE — Progress Notes (Signed)
TWALA, COLLINGS ([id number]) [id number] Page 1 of 9 Visit Report for 12/15/2022 Arrival Information Details Patient Name: Date of Service: [name], [name] 12/15/2022 11:00 A M Medical Record Number: [id number] Patient Account Number: [id number] Date of Birth/Sex: Treating RN: 1946/10/29 (77 y.o. F) Primary Care Baran Kuhrt: Milus Mallick, MA RITZA Other Clinician: Referring Hendryx Ricke: Treating Deloras Reichard/Extender: Terese Door, MA RITZA Weeks in Treatment: 10 Visit Information History Since Last Visit All ordered tests and consults were completed: No Patient Arrived: Kasandra Knudsen Added or deleted any medications: No Arrival Time: 11:06 Any new allergies or adverse reactions: No Accompanied By: husband Had a fall or experienced change in No Transfer Assistance: None activities of daily living that may affect Patient Identification Verified: Yes risk of falls: Secondary Verification Process Completed: Yes Signs or symptoms of abuse/neglect since last visito No Patient Requires Transmission-Based Precautions: No Hospitalized since last visit: No Patient Has Alerts: No Implantable device outside of the clinic excluding No cellular tissue based products placed in the center since last visit: Has Dressing in Place as Prescribed: Yes Has Compression in Place as Prescribed: Yes Pain Present Now: No Electronic Signature([name]) Signed: 12/15/2022 3:51:29 PM By: Baruch Gouty RN, BSN Entered By: Baruch Gouty on 12/15/2022 11:14:06 -------------------------------------------------------------------------------- Compression Therapy Details Patient Name: Date of Service: [name], [name] Balo [name]. 12/15/2022 11:00 A M Medical Record Number: [id number] Patient Account Number: [id number] Date of Birth/Sex: Treating RN: 11-Nov-1946 (77 y.o. Elam Dutch Primary Care Kojo Liby: Milus Mallick, MA RITZA Other Clinician: Referring Edrik Rundle: Treating Olla Delancey/Extender:  Terese Door, MA RITZA Weeks in Treatment: 10 Compression Therapy Performed for Wound Assessment: Wound #6 Right,Lateral Ankle Performed By: Clinician Baruch Gouty, RN Compression Type: Three Layer Post Procedure Diagnosis Same as Pre-procedure Electronic Signature([name]) Signed: 12/15/2022 3:51:29 PM By: Baruch Gouty RN, BSN Entered By: Baruch Gouty on 12/15/2022 11:31:38 [name] ([id number]) [id number] Page 2 of 9 -------------------------------------------------------------------------------- Encounter Discharge Information Details Patient Name: Date of Service: [name], [name] 12/15/2022 11:00 A M Medical Record Number: [id number] Patient Account Number: [id number] Date of Birth/Sex: Treating RN: 1946-02-10 (77 y.o. Elam Dutch Primary Care Arman Loy: Milus Mallick, MA RITZA Other Clinician: Referring Rye Dorado: Treating Jamarkus Lisbon/Extender: Terese Door, MA RITZA Weeks in Treatment: 10 Encounter Discharge Information Items Post Procedure Vitals Discharge Condition: Stable Temperature (F): 97.8 Ambulatory Status: Crutches Pulse (bpm): 71 Discharge Destination: Home Respiratory Rate (breaths/min): 18 Transportation: Private Auto Blood Pressure (mmHg): 140/75 Accompanied By: spouse Schedule Follow-up Appointment: Yes Clinical Summary of Care: Patient Declined Electronic Signature([name]) Signed: 12/15/2022 3:51:29 PM By: Baruch Gouty RN, BSN Entered By: Baruch Gouty on 12/15/2022 11:59:39 -------------------------------------------------------------------------------- Lower Extremity Assessment Details Patient Name: Date of Service: [facility], [name] Balo [name]. 12/15/2022 11:00 A M Medical Record Number: [id number] Patient Account Number: [id number] Date of Birth/Sex: Treating RN: 03-20-1946 (77 y.o. Elam Dutch Primary Care Enes Wegener: Milus Mallick, MA RITZA Other Clinician: Referring Shirlee Whitmire: Treating  Brigette Hopfer/Extender: Terese Door, MA RITZA Weeks in Treatment: 10 Edema Assessment Assessed: [Left: No] [Right: No] Edema: [Left: Ye] [Right: [name]] Calf Left: Right: Point of Measurement: From Medial Instep 32.5 cm Ankle Left: Right: Point of Measurement: From Medial Instep 21.5 cm Vascular Assessment Pulses: Dorsalis Pedis Palpable: [Right:Yes] Electronic Signature([name]) Signed: 12/15/2022 3:51:29 PM By: Baruch Gouty RN, BSN Entered By: Baruch Gouty on 12/15/2022 11:15:15 [name] ([id number]) [id number] Page 3 of 9 -------------------------------------------------------------------------------- Multi Wound Chart Details Patient Name: Date of Service: [name], [name]  [name]. 12/15/2022 11:00 A M Medical Record Number: [id number] Patient Account Number: [id number] Date of Birth/Sex: Treating RN: [birth year] (77 y.o. F) Primary Care Angeliah Wisdom: A Georges Lynch, MA RITZA Other Clinician: Referring Kamil Hanigan: Treating Dornell Grasmick/Extender: Terese Door, MA RITZA Weeks in Treatment: 10 Vital Signs Height(in): 65 Pulse(bpm): 68 Weight(lbs): 229 Blood Pressure(mmHg): 140/75 Body Mass Index(BMI): 38.1 Temperature(F): 97.8 Respiratory Rate(breaths/min): 20 [6:Photos:] [N/A:N/A] Right, Lateral Ankle Left, Anterior Lower Leg N/A Wound Location: Gradually Appeared Gradually Appeared N/A Wounding Event: Venous Leg Ulcer Venous Leg Ulcer N/A Primary Etiology: Cataracts, Anemia, Lymphedema, Cataracts, Anemia, Lymphedema, N/A Comorbid History: Congestive Heart Failure, Congestive Heart Failure, Hypertension, Peripheral Venous Hypertension, Peripheral Venous Disease, Osteoarthritis, Neuropathy, Disease, Osteoarthritis, Neuropathy, Confinement Anxiety Confinement Anxiety 09/25/2022 12/01/2022 N/A Date Acquired: 10 1 N/A Weeks of Treatment: Open Healed - Epithelialized N/A Wound Status: No No N/A Wound Recurrence: 0.8x0.8x0.1 0x0x0  N/A Measurements L x W x D (cm) 0.503 0 N/A A (cm) : rea 0.05 0 N/A Volume (cm) : 51.50% 100.00% N/A % Reduction in A rea: 51.90% 100.00% N/A % Reduction in Volume: Full Thickness Without Exposed Full Thickness Without Exposed N/A Classification: Support Structures Support Structures Medium None Present N/A Exudate A mount: Serous N/A N/A Exudate Type: amber N/A N/A Exudate Color: Flat and Intact N/A N/A Wound Margin: Medium (34-66%) None Present (0%) N/A Granulation A mount: Pink N/A N/A Granulation Quality: Medium (34-66%) None Present (0%) N/A Necrotic A mount: Fat Layer (Subcutaneous Tissue): Yes Fascia: No N/A Exposed Structures: Fascia: No Fat Layer (Subcutaneous Tissue): No Tendon: No Tendon: No Muscle: No Muscle: No Joint: No Joint: No Bone: No Bone: No Small (1-33%) Large (67-100%) N/A Epithelialization: Debridement - Excisional N/A N/A Debridement: Pre-procedure Verification/Time Out 11:30 N/A N/A Taken: Lidocaine 4% Topical Solution N/A N/A Pain Control: Subcutaneous, Slough N/A N/A Tissue Debrided: Skin/Subcutaneous Tissue N/A N/A Level: 0.64 N/A N/A Debridement A (sq cm): rea Curette N/A N/A Instrument: Minimum N/A N/A Bleeding: Pressure N/A N/A Hemostasis A chieved: 0 N/A N/A Procedural Pain: 0 N/A N/A Post Procedural Pain: Procedure was tolerated well N/A N/A Debridement Treatment Response: 0.8x0.8x0.1 N/A N/A Post Debridement Measurements L x W x D (cm) Shehan, Angelmarie [name] ([id number]) [id number] Page 4 of 9 0.05 N/A N/A Post Debridement Volume: (cm) No Abnormalities Noted Scarring: No N/A Periwound Skin Texture: Maceration: No Dry/Scaly: Yes N/A Periwound Skin Moisture: Dry/Scaly: No Hemosiderin Staining: Yes Hemosiderin Staining: Yes N/A Periwound Skin Color: Erythema: No No Abnormality No Abnormality N/A Temperature: Yes N/A N/A Tenderness on Palpation: Cellular or Tissue Based  Product N/A N/A Procedures Performed: Compression Therapy Debridement Treatment Notes Electronic Signature([name]) Signed: 12/15/2022 11:41:08 AM By: Fredirick Maudlin MD FACS Entered By: Fredirick Maudlin on 12/15/2022 11:41:08 -------------------------------------------------------------------------------- Multi-Disciplinary Care Plan Details Patient Name: Date of Service: [name], Jorie [name]. 12/15/2022 11:00 A M Medical Record Number: [id number] Patient Account Number: [id number] Date of Birth/Sex: Treating RN: 21-Jun-1946 (77 y.o. Elam Dutch Primary Care Farhad Burleson: Milus Mallick, MA RITZA Other Clinician: Referring Lucina Betty: Treating Modupe Shampine/Extender: Terese Door, MA RITZA Weeks in Treatment: 10 Active Inactive Nutrition Nursing Diagnoses: Potential for alteratiion in Nutrition/Potential for imbalanced nutrition Goals: Patient/caregiver agrees to and verbalizes understanding of need to use nutritional supplements and/or vitamins as prescribed Date Initiated: 10/13/2022 Target Resolution Date: 02/25/2023 Goal Status: Active Interventions: Provide education on nutrition Treatment Activities: Dietary management education, guidance and counseling : 10/13/2022 Education provided on Nutrition : 10/25/2022 Notes: Wound/Skin Impairment Nursing Diagnoses: Impaired tissue integrity Goals: Ulcer/skin breakdown  will have a volume reduction of 30% by week 4 Date Initiated: 10/13/2022 Target Resolution Date: 02/25/2023 Goal Status: Active Interventions: Provide education on ulcer and skin care Notes: Electronic Signature([name]) Signed: 12/15/2022 3:51:29 PM By: Zenaida Deed RN, BSN Entered By: Zenaida Deed on 12/15/2022 11:28:56 [name] ([id number]) [id number] Page 5 of 9 -------------------------------------------------------------------------------- Pain Assessment Details Patient Name: Date of Service: [name], [name]  12/15/2022 11:00 A M Medical Record Number: [id number] Patient Account Number: [id number] Date of Birth/Sex: Treating RN: June 20, 1946 (77 y.o. F) Primary Care Mahiya Kercheval: Driscilla Grammes, MA RITZA Other Clinician: Referring Mishawn Hemann: Treating Zuri Lascala/Extender: Hulen Skains, MA RITZA Weeks in Treatment: 10 Active Problems Location of Pain Severity and Description of Pain Patient Has Paino No Site Locations Rate the pain. Current Pain Level: 0 Pain Management and Medication Current Pain Management: Electronic Signature([name]) Signed: 12/15/2022 3:51:29 PM By: Zenaida Deed RN, BSN Entered By: Zenaida Deed on 12/15/2022 11:14:28 -------------------------------------------------------------------------------- Patient/Caregiver Education Details Patient Name: Date of Service: [name], [name] 1/19/2024andnbsp11:00 A M Medical Record Number: [id number] Patient Account Number: [id number] Date of Birth/Gender: Treating RN: 12-Nov-1946 (77 y.o. Tommye Standard Primary Care Physician: Driscilla Grammes, MA RITZA Other Clinician: Referring Physician: Treating Physician/Extender: Hulen Skains, MA RITZA Weeks in Treatment: 10 Education Assessment Education Provided To: Patient Education Topics Provided Venous: Methods: Explain/Verbal [name], SANTORI ([id number]) [id number] Page 6 of 9 Responses: Reinforcements needed, State content correctly Wound/Skin Impairment: Methods: Explain/Verbal Responses: Reinforcements needed, State content correctly Electronic Signature([name]) Signed: 12/15/2022 3:51:29 PM By: Zenaida Deed RN, BSN Entered By: Zenaida Deed on 12/15/2022 11:29:29 -------------------------------------------------------------------------------- Wound Assessment Details Patient Name: Date of Service: [name], [name] Bray [name]. 12/15/2022 11:00 A M Medical Record Number: [id number] Patient Account Number: [id number] Date of Birth/Sex:  Treating RN: 05/15/1946 (77 y.o. Tommye Standard Primary Care Bocephus Cali: Driscilla Grammes, MA RITZA Other Clinician: Referring Jonavin Seder: Treating Shenay Torti/Extender: Hulen Skains, MA RITZA Weeks in Treatment: 10 Wound Status Wound Number: 6 Primary Venous Leg Ulcer Etiology: Wound Location: Right, Lateral Ankle Wound Open Wounding Event: Gradually Appeared Status: Date Acquired: 09/25/2022 Comorbid Cataracts, Anemia, Lymphedema, Congestive Heart Failure, Weeks Of Treatment: 10 History: Hypertension, Peripheral Venous Disease, Osteoarthritis, Clustered Wound: No Neuropathy, Confinement Anxiety Photos Wound Measurements Length: (cm) 0.8 Width: (cm) 0.8 Depth: (cm) 0.1 Area: (cm) 0.503 Volume: (cm) 0.05 % Reduction in Area: 51.5% % Reduction in Volume: 51.9% Epithelialization: Small (1-33%) Tunneling: No Undermining: No Wound Description Classification: Full Thickness Without Exposed Support Wound Margin: Flat and Intact Exudate Amount: Medium Exudate Type: Serous Exudate Color: amber Structures Foul Odor After Cleansing: No Slough/Fibrino Yes Wound Bed Granulation Amount: Medium (34-66%) Exposed Structure Granulation Quality: Pink Fascia Exposed: No Necrotic Amount: Medium (34-66%) Fat Layer (Subcutaneous Tissue) Exposed: Yes Necrotic Quality: Adherent Slough Tendon Exposed: No Muscle Exposed: No Joint Exposed: No Bone Exposed: No [name], [name] ([id number]) [phone].pdf Page 7 of 9 Periwound Skin Texture Texture Color No Abnormalities Noted: Yes No Abnormalities Noted: No Erythema: No Moisture Hemosiderin Staining: Yes No Abnormalities Noted: No Dry / Scaly: No Temperature / Pain Maceration: No Temperature: No Abnormality Tenderness on Palpation: Yes Treatment Notes Wound #6 (Ankle) Wound Laterality: Right, Lateral Cleanser Soap and Water Discharge Instruction: May shower and wash wound with dial antibacterial soap  and water prior to dressing change. Peri-Wound Care Zinc Oxide Ointment 30g tube Discharge Instruction: Apply Zinc Oxide to periwound with each dressing change Sween Lotion (Moisturizing lotion) Discharge Instruction: Apply moisturizing lotion  as directed Topical Primary Dressing APLIGRAF Secondary Dressing Woven Gauze Sponge, Non-Sterile 4x4 in Discharge Instruction: Apply over primary dressing as directed. Secured With Transpore Surgical Tape, 2x10 (in/yd) Discharge Instruction: Secure dressing with tape as directed. Compression Wrap ThreePress (3 layer compression wrap) Discharge Instruction: Apply three layer compression as directed. Compression Stockings Add-Ons Electronic Signature([name]) Signed: 12/15/2022 3:51:29 PM By: Baruch Gouty RN, BSN Entered By: Baruch Gouty on 12/15/2022 11:21:33 -------------------------------------------------------------------------------- Wound Assessment Details Patient Name: Date of Service: [name], [name] Balo [name]. 12/15/2022 11:00 A M Medical Record Number: [id number] Patient Account Number: [id number] Date of Birth/Sex: Treating RN: 08-Jul-1946 (77 y.o. Elam Dutch Primary Care Deloria Brassfield: Milus Mallick, MA RITZA Other Clinician: Referring Adrienne Trombetta: Treating Natausha Jungwirth/Extender: Terese Door, MA RITZA Weeks in Treatment: 10 Wound Status Wound Number: 7 Primary Venous Leg Ulcer Etiology: Wound Location: Left, Anterior Lower Leg Wound Healed - Epithelialized Wounding Event: Gradually Appeared Status: Date Acquired: 12/01/2022 Comorbid Cataracts, Anemia, Lymphedema, Congestive Heart Failure, Weeks Of Treatment: 1 History: Hypertension, Peripheral Venous Disease, Osteoarthritis, Clustered Wound: No Neuropathy, Confinement Anxiety Photos [name], [name] ([id number]) [phone].pdf Page 8 of 9 Wound Measurements Length: (cm) Width: (cm) Depth: (cm) Area: (cm) Volume: (cm) 0 % Reduction in Area:  100% 0 % Reduction in Volume: 100% 0 Epithelialization: Large (67-100%) 0 Tunneling: No 0 Undermining: No Wound Description Classification: Full Thickness Without Exposed Support Structures Exudate Amount: None Present Foul Odor After Cleansing: No Slough/Fibrino No Wound Bed Granulation Amount: None Present (0%) Exposed Structure Necrotic Amount: None Present (0%) Fascia Exposed: No Fat Layer (Subcutaneous Tissue) Exposed: No Tendon Exposed: No Muscle Exposed: No Joint Exposed: No Bone Exposed: No Periwound Skin Texture Texture Color No Abnormalities Noted: No No Abnormalities Noted: No Scarring: No Hemosiderin Staining: Yes Moisture Temperature / Pain No Abnormalities Noted: No Temperature: No Abnormality Dry / Scaly: Yes Electronic Signature([name]) Signed: 12/15/2022 3:51:29 PM By: Baruch Gouty RN, BSN Entered By: Baruch Gouty on 12/15/2022 11:22:05 -------------------------------------------------------------------------------- [place] Details Patient Name: Date of Service: [name], [name]. 12/15/2022 11:00 A M Medical Record Number: [id number] Patient Account Number: [id number] Date of Birth/Sex: Treating RN: [birth year] (77 y.o. F) Primary Care Alric Geise: Milus Mallick, MA RITZA Other Clinician: Referring Genevia Bouldin: Treating Jaylei Fuerte/Extender: Terese Door, MA RITZA Weeks in Treatment: 10 Vital Signs Time Taken: 11:07 Temperature (F): 97.8 Height (in): 65 Pulse (bpm): 71 Weight (lbs): 229 Respiratory Rate (breaths/min): 20 Body Mass Index (BMI): 38.1 Blood Pressure (mmHg): 140/75 Reference Range: 80 - 120 mg / dl Electronic Signature([name]) Knisley, Katianne [name] ([id number]) [phone].pdf Page 9 of 9 Signed: 12/15/2022 1:44:32 PM By: Worthy Rancher Entered By: Worthy Rancher on 12/15/2022 11:07:35

## 2022-12-15 NOTE — Progress Notes (Signed)
[name] ([id number]) [id number] Page 1 of 12 Visit Report for 12/15/2022 Chief Complaint Document Details Patient Name: Date of Service: [name], [name] 12/15/2022 11:00 A M Medical Record Number: [id number] Patient Account Number: [id number] Date of Birth/Sex: Treating RN: 1946/03/09 (77 y.o. F) Primary Care Provider: Milus Mallick, MA [name] Other Clinician: Referring Provider: Treating Provider/Extender: [name] Door, MA [name] Weeks in Treatment: 10 Information Obtained from: Patient Chief Complaint Bilateral LE Ulcers 10/05/2022: RLE ulcer Electronic Signature(s) Signed: 12/15/2022 11:41:30 AM By: [name] Maudlin MD FACS Entered By: [name] on 12/15/2022 11:41:30 -------------------------------------------------------------------------------- Cellular or Tissue Based Product Details Patient Name: Date of Service: [name], [name]. 12/15/2022 11:00 A M Medical Record Number: [id number] Patient Account Number: [id number] Date of Birth/Sex: Treating RN: 1946-10-10 (77 y.o. [name] Primary Care Provider: Milus Mallick, MA [name] Other Clinician: Referring Provider: Treating Provider/Extender: [name] Door, MA [name] Weeks in Treatment: 10 Cellular or Tissue Based Product Type Wound #6 Right,Lateral Ankle Applied to: Performed By: Physician [name] Maudlin, MD Cellular or Tissue Based Product Type: Apligraf Level of Consciousness (Pre-procedure): Awake and Alert Pre-procedure Verification/Time Out Yes - 11:30 Taken: Location: trunk / arms / legs Wound Size (sq cm): 0.64 Product Size (sq cm): 44 Waste Size (sq cm): 40 Waste Reason: wound size Amount of Product Applied (sq cm): 4 Instrument Used: Blade, Forceps, Scissors Lot #: [id number] Order #: 2 Expiration Date: 12/19/2022 Fenestrated: Yes Instrument: Blade Reconstituted: Yes Solution Type: saline Solution Amount: 10 ml Lot #:  [id number] Solution Expiration Date: 04/26/2025 Secured: Yes Secured With: Steri-Strips Dressing Applied: Yes Primary Dressing: gauzeJurnee, [name], Nolon [name] ([id number]) [phone].pdf Page 2 of 12 Procedural Pain: 0 Post Procedural Pain: 0 Response to Treatment: Procedure was tolerated well Level of Consciousness (Post- Awake and Alert procedure): Post Procedure Diagnosis Same as Pre-procedure Notes scribed by [name] Gouty, RN for Dr. Celine [name] Electronic Signature(s) Signed: 12/15/2022 11:55:57 AM By: [name] Maudlin MD FACS Signed: 12/15/2022 3:51:29 PM By: [name] Gouty RN, BSN Entered By: [name] on 12/15/2022 11:37:39 -------------------------------------------------------------------------------- Debridement Details Patient Name: Date of Service: [name], [name]. 12/15/2022 11:00 A M Medical Record Number: [id number] Patient Account Number: [id number] Date of Birth/Sex: Treating RN: [birth year] (77 y.o. [name] Primary Care Provider: Milus Mallick, MA [name] Other Clinician: Referring Provider: Treating Provider/Extender: [name] Door, MA [name] Weeks in Treatment: 10 Debridement Performed for Assessment: Wound #6 Right,Lateral Ankle Performed By: Physician [name] Maudlin, MD Debridement Type: Debridement Severity of Tissue Pre Debridement: Fat layer exposed Level of Consciousness (Pre-procedure): Awake and Alert Pre-procedure Verification/Time Out Yes - 11:30 Taken: Start Time: 11:31 Pain Control: Lidocaine 4% T opical Solution T Area Debrided (L x W): otal 0.8 (cm) x 0.8 (cm) = 0.64 (cm) Tissue and other material debrided: Viable, Non-Viable, Slough, Subcutaneous, Slough Level: Skin/Subcutaneous Tissue Debridement Description: Excisional Instrument: Curette Bleeding: Minimum Hemostasis Achieved: Pressure Procedural Pain: 0 Post Procedural Pain: 0 Response to Treatment: Procedure was tolerated well Level  of Consciousness (Post- Awake and Alert procedure): Post Debridement Measurements of Total Wound Length: (cm) 0.8 Width: (cm) 0.8 Depth: (cm) 0.1 Volume: (cm) 0.05 Character of Wound/Ulcer Post Debridement: Improved Severity of Tissue Post Debridement: Fat layer exposed Post Procedure Diagnosis Same as Pre-procedure Notes scribed by [name] Gouty, RN for Dr. Celine [name] Electronic Signature(s) Signed: 12/15/2022 11:55:57 AM By: [name] Maudlin MD FACS Signed: 12/15/2022 3:51:29 PM By: [name] Gouty RN, BSN Entered By: [name] on 12/15/2022  11:37:26 [name] ([id number]) [id number] Page 3 of 12 -------------------------------------------------------------------------------- HPI Details Patient Name: Date of Service: [name], [name] 12/15/2022 11:00 A M Medical Record Number: [id number] Patient Account Number: [id number] Date of Birth/Sex: Treating RN: 1946/09/27 (77 y.o. F) Primary Care Provider: Driscilla Grammes, MA [name] Other Clinician: Referring Provider: Treating Provider/Extender: [name] Skains, MA [name] Weeks in Treatment: 10 History of Present Illness HPI Description: 07/09/18 on evaluation today patient presents for bilateral lower extremity ulcers. She has a past medical history significant for chronic venous stasis, hypertension, and lower extremity edema with associated skin changes. Unfortunately she tells me that roughly 3 weeks ago she noted ulcers on the left lower extremity which started given her trouble. Subsequently she ended up proceeding with applying mupirocin ointment and was keeping this open air for the most part. She states that it would scab over somewhat and then reopened. The wounds have been training quite significantly. Fortunately there does not appear to be any evidence of overt infection there is some erythematous type skin changes but I believe this is likely stasis dermatitis nothing  more. Fortunately she has no evidence of any systemic infection. No fevers, chills, nausea, or vomiting noted at this time. She has been recommended before to wear compression stockings but she does not wear them on a regular basis. She has never been suggested to get Juxta-Lite compression wraps she was not aware of what these were. Patient had venous studies performed on 11/09/17 which showed no lower surety DVT or deep vein insufficiency noted. She did not have evaluation however of the superficial veins. There was full compressibility noted throughout. Her arterial study which was performed on 11/18/17 actually showed that she has an ABI on the right of 1.17 with a TBI of 1.59 along with an ABI of 1.12 on the left and a TBI of 0.99. 07/17/18 on evaluation today patient appears to be doing much better in regard to her left lower Trinity ulcers. She's been tolerating the dressing changes without complication. There does not appear to be any evidence of infection which is good news. Overall I'm very pleased with the progress at this time. 07/24/18 on evaluation today patient's wound actually appears to be completely healed at this time. Fortunately there does not appear to be any evidence of infection which is good news. Overall I'm very happy with the progress that has been made. She did get her Velcro compression wraps. READMISSION 12/31/2020 This is a patient we had in this clinic in 2019 felt to have chronic venous insufficiency wounds on her lower extremities. She was discharged with bilateral compression stockings. She tells [place] that she never really wore them or at best wore them sparingly. She is back in clinic today having a ulcer on her left lateral lower extremity since October and one on the right anterior for the last 2 weeks. I do not know that she is doing any primary dressing on these certainly not wearing any form of compression. She is already been evaluated by vein and vascular on  11/15/2020. They noted that she did have ulcers on the left leg in October that healed with a need Unna boot. She had a reflux study on 12/20 that did not show evidence of a DVT or venous reflux bilaterally. They was not felt that she had arterial insufficiency based on the fact that she had brisk biphasic Doppler signals in the bilateral DP PT. The patient comes in today with superficial wounds  on the right anterior mid tibia and the left lateral calf. These are small appear to have healthy surfaces. Past medical history includes cervical and upper thoracic spine surgery on 7/21, hernia repair early in January, hypertension, hyperlipidemia, hypothyroidism, gastric bypass surgery, DVT in the right peroneal artery and left popliteal and peroneal veins. Next problem CHF. She does not have diabetes ABIs in our clinic were 1.1 on the right and 1.2 on the lower 2/11; the patient's wound on the left lateral and right anterior lower leg are closed for edema control is good. She has an assortment of old juxta lite stockings with old liners. She does not like to wear anything in particular under the compression part of the juxta lite stocking. I think she is going to do exactly what she wants. READMISSION 10/05/2022 This is a 77 year old woman last seen in our clinic about 18 months ago. She has a history of venous stasis and stasis dermatitis. A reflux study done in 2021 did not show any evidence of venous reflux. She says about 3 weeks ago, she noticed an ulcer on her right lateral malleolus. She was applying triple antibiotic ointment to it for a while and then was soaking it in Epsom salts. She reports that she usually wears compression stockings, but that they were rubbing so she did not wear 1 on her right leg since the wound appeared, she has a stocking with the toe and heel cut out applied to her left leg. On examination, she has changes of chronic stasis dermatitis on the right. There is a shallow  ulcer overlying the lateral malleolus. The fat layer is exposed. It is a little bit desiccated but fairly clean with just some light eschar and slough present. No malodor or purulent drainage. 10/13/2022: The wound is a little smaller today. Although the moisture balance is better than last week, it is still a bit dry. There is still some slough accumulation on the surface. 10/25/2022: The wound continues to contract. There is still thick layer of slough on the surface. The moisture balance of the wound is better, but the periwound skin is slightly macerated. 11/02/2022: The wound is smaller and shallower today. There is still slough on the surface, but the periwound skin is intact. The wound surface is still drier than ideal. 12/15; wound is about the same size minimal depth. Under illumination some debris on the surface she has been using Prisma 11/16/2022: The leg wrap became saturated when her makeshift cast protector leaked. The wound is a little bit larger today, as result of moisture-related tissue breakdown. The surface remains fairly fibrotic. 11/30/2022: The wound measured a little bit larger today secondary to additional moisture related periwound breakdown. There is slough on the wound surface. Still fairly fibrotic underneath. 12/05/2022: The ankle wound is a little smaller but still has slough accumulation. She has been approved for Apligraf and we are going to apply that today. [name], [name] ([id number]) [id number] Page 4 of 12 12/15/2022: The wound is smaller again today. Minimal slough. Edema control is good. Electronic Signature(s) Signed: 12/15/2022 11:43:37 AM By: [name] Maudlin MD FACS Entered By: [name] on 12/15/2022 11:43:36 -------------------------------------------------------------------------------- Physical Exam Details Patient Name: Date of Service: [name], [name]. 12/15/2022 11:00 A M Medical Record Number:  [id number] Patient Account Number: [id number] Date of Birth/Sex: Treating RN: 15-Jan-1946 (77 y.o. F) Primary Care Provider: Milus Mallick, MA [name] Other Clinician: Referring Provider: Treating Provider/Extender: [name] Door, MA [name] Weeks in Treatment:  10 Constitutional . . . . no acute distress. Respiratory Normal work of breathing on room air. Notes 12/15/2022: The wound is smaller again today. Minimal slough. Edema control is good. Electronic Signature(s) Signed: 12/15/2022 11:48:03 AM By: [name] Maudlin MD FACS Entered By: [name] on 12/15/2022 11:48:03 -------------------------------------------------------------------------------- Physician Orders Details Patient Name: Date of Service: [name], Michaelyn [name]. 12/15/2022 11:00 A M Medical Record Number: [id number] Patient Account Number: [id number] Date of Birth/Sex: Treating RN: 1946-05-12 (77 y.o. [name] Primary Care Provider: Milus Mallick, MA [name] Other Clinician: Referring Provider: Treating Provider/Extender: [name] Door, MA [name] Weeks in Treatment: 10 Verbal / Phone Orders: No Diagnosis Coding ICD-10 Coding Code Description [id number] Non-pressure chronic ulcer of right ankle with fat layer exposed I87.2 Venous insufficiency (chronic) (peripheral) 99991111 Chronic diastolic (congestive) heart failure E66.09 Other obesity due to excess calories Follow-up Appointments ppointment in 1 week. - Dr. Celine [name] Room 1 Return A ppointment in 2 weeks. - Dr. Celine [name] RM 1 Return A Friday 2/2 @ 12:30 pm Nurse Visit: - Friday 1/26 @ 2:15 pm RM 1 post apligraf Anesthetic (In clinic) Topical Lidocaine 4% applied to wound bed [facility], Ezrah [name] ([id number]) [phone].pdf Page 5 of 12 Cellular or Tissue Based Products Wound #6 Right,Lateral Ankle Cellular or Tissue Based Product Type: - APLIGRAF #2 Cellular or Tissue Based Product applied to wound bed, secured with  steri-strips, cover with Adaptic or Mepitel. (DO NOT REMOVE). Bathing/ Shower/ Hygiene May shower with protection but do not get wound dressing(s) wet. Protect dressing(s) with water repellant cover (for example, large plastic bag) or a cast cover and may then take shower. - may purchase cast protector at [facility], [facility] or [employer] Edema Control - Lymphedema / SCD / Other Bilateral Lower Extremities Elevate legs to the level of the heart or above for 30 minutes daily and/or when sitting for 3-4 times a day throughout the day. Avoid standing for long periods of time. Patient to wear own compression stockings every day. Moisturize legs daily. Wound Treatment Wound #6 - Ankle Wound Laterality: Right, Lateral Cleanser: Soap and Water 1 x Per Week/30 Days Discharge Instructions: May shower and wash wound with dial antibacterial soap and water prior to dressing change. Peri-Wound Care: Zinc Oxide Ointment 30g tube 1 x Per Week/30 Days Discharge Instructions: Apply Zinc Oxide to periwound with each dressing change Peri-Wound Care: Sween Lotion (Moisturizing lotion) 1 x Per Week/30 Days Discharge Instructions: Apply moisturizing lotion as directed Prim Dressing: APLIGRAF ary 1 x Per Week/30 Days Secondary Dressing: Woven Gauze Sponge, Non-Sterile 4x4 in 1 x Per Week/30 Days Discharge Instructions: Apply over primary dressing as directed. Secured With: Transpore Surgical Tape, 2x10 (in/yd) 1 x Per Week/30 Days Discharge Instructions: Secure dressing with tape as directed. Compression Wrap: ThreePress (3 layer compression wrap) 1 x Per Week/30 Days Discharge Instructions: Apply three layer compression as directed. Electronic Signature(s) Signed: 12/15/2022 11:55:57 AM By: [name] Maudlin MD FACS Entered By: [name] on 12/15/2022 11:48:51 -------------------------------------------------------------------------------- Problem List Details Patient Name: Date of Service: [name], [name]  [name]. 12/15/2022 11:00 A M Medical Record Number: [id number] Patient Account Number: [id number] Date of Birth/Sex: Treating RN: Jun 20, 1946 (77 y.o. [name] Primary Care Provider: Milus Mallick, MA [name] Other Clinician: Referring Provider: Treating Provider/Extender: [name] Door, MA [name] Weeks in Treatment: 10 Active Problems ICD-10 Encounter Code Description Active Date MDM Diagnosis L97.312 Non-pressure chronic ulcer of right ankle with fat layer exposed 10/05/2022 No Yes I87.2 Venous  insufficiency (chronic) (peripheral) 10/05/2022 No Yes 99991111 Chronic diastolic (congestive) heart failure 10/05/2022 No Yes Mallette, Shawana [name] ([id number]) [phone].pdf Page 6 of 12 E66.09 Other obesity due to excess calories 10/05/2022 No Yes Inactive Problems Resolved Problems Electronic Signature(s) Signed: 12/15/2022 11:40:21 AM By: [name] Maudlin MD FACS Entered By: [name] on 12/15/2022 11:40:20 -------------------------------------------------------------------------------- Progress Note Details Patient Name: Date of Service: [name], [name]. 12/15/2022 11:00 A M Medical Record Number: [id number] Patient Account Number: [id number] Date of Birth/Sex: Treating RN: 01/30/1946 (77 y.o. F) Primary Care Provider: Milus Mallick, MA [name] Other Clinician: Referring Provider: Treating Provider/Extender: [name] Door, MA [name] Weeks in Treatment: 10 Subjective Chief Complaint Information obtained from Patient Bilateral LE Ulcers 10/05/2022: RLE ulcer History of Present Illness (HPI) 07/09/18 on evaluation today patient presents for bilateral lower extremity ulcers. She has a past medical history significant for chronic venous stasis, hypertension, and lower extremity edema with associated skin changes. Unfortunately she tells me that roughly 3 weeks ago she noted ulcers on the left lower extremity which started given her trouble.  Subsequently she ended up proceeding with applying mupirocin ointment and was keeping this open air for the most part. She states that it would scab over somewhat and then reopened. The wounds have been training quite significantly. Fortunately there does not appear to be any evidence of overt infection there is some erythematous type skin changes but I believe this is likely stasis dermatitis nothing more. Fortunately she has no evidence of any systemic infection. No fevers, chills, nausea, or vomiting noted at this time. She has been recommended before to wear compression stockings but she does not wear them on a regular basis. She has never been suggested to get Juxta-Lite compression wraps she was not aware of what these were. Patient had venous studies performed on 11/09/17 which showed no lower surety DVT or deep vein insufficiency noted. She did not have evaluation however of the superficial veins. There was full compressibility noted throughout. Her arterial study which was performed on 11/18/17 actually showed that she has an ABI on the right of 1.17 with a TBI of 1.59 along with an ABI of 1.12 on the left and a TBI of 0.99. 07/17/18 on evaluation today patient appears to be doing much better in regard to her left lower Trinity ulcers. She's been tolerating the dressing changes without complication. There does not appear to be any evidence of infection which is good news. Overall I'm very pleased with the progress at this time. 07/24/18 on evaluation today patient's wound actually appears to be completely healed at this time. Fortunately there does not appear to be any evidence of infection which is good news. Overall I'm very happy with the progress that has been made. She did get her Velcro compression wraps. READMISSION 12/31/2020 This is a patient we had in this clinic in 2019 felt to have chronic venous insufficiency wounds on her lower extremities. She was discharged with  bilateral compression stockings. She tells [place] that she never really wore them or at best wore them sparingly. She is back in clinic today having a ulcer on her left lateral lower extremity since October and one on the right anterior for the last 2 weeks. I do not know that she is doing any primary dressing on these certainly not wearing any form of compression. She is already been evaluated by vein and vascular on 11/15/2020. They noted that she did have ulcers on the left leg in October  that healed with a need Unna boot. She had a reflux study on 12/20 that did not show evidence of a DVT or venous reflux bilaterally. They was not felt that she had arterial insufficiency based on the fact that she had brisk biphasic Doppler signals in the bilateral DP PT. The patient comes in today with superficial wounds on the right anterior mid tibia and the left lateral calf. These are small appear to have healthy surfaces. Past medical history includes cervical and upper thoracic spine surgery on 7/21, hernia repair early in January, hypertension, hyperlipidemia, hypothyroidism, gastric bypass surgery, DVT in the right peroneal artery and left popliteal and peroneal veins. Next problem CHF. She does not have diabetes ABIs in our clinic were 1.1 on the right and 1.2 on the lower 2/11; the patient's wound on the left lateral and right anterior lower leg are closed for edema control is good. She has an assortment of old juxta lite stockings with old liners. She does not like to wear anything in particular under the compression part of the juxta lite stocking. I think she is going to do exactly what she wants. READMISSION 10/05/2022 This is a 77 year old woman last seen in our clinic about 18 months ago. She has a history of venous stasis and stasis dermatitis. A reflux study done in 2021 [place], [place] [name] ([id number]) [id number] Page 7 of 12 did not show any evidence of venous reflux.  She says about 3 weeks ago, she noticed an ulcer on her right lateral malleolus. She was applying triple antibiotic ointment to it for a while and then was soaking it in Epsom salts. She reports that she usually wears compression stockings, but that they were rubbing so she did not wear 1 on her right leg since the wound appeared, she has a stocking with the toe and heel cut out applied to her left leg. On examination, she has changes of chronic stasis dermatitis on the right. There is a shallow ulcer overlying the lateral malleolus. The fat layer is exposed. It is a little bit desiccated but fairly clean with just some light eschar and slough present. No malodor or purulent drainage. 10/13/2022: The wound is a little smaller today. Although the moisture balance is better than last week, it is still a bit dry. There is still some slough accumulation on the surface. 10/25/2022: The wound continues to contract. There is still thick layer of slough on the surface. The moisture balance of the wound is better, but the periwound skin is slightly macerated. 11/02/2022: The wound is smaller and shallower today. There is still slough on the surface, but the periwound skin is intact. The wound surface is still drier than ideal. 12/15; wound is about the same size minimal depth. Under illumination some debris on the surface she has been using Prisma 11/16/2022: The leg wrap became saturated when her makeshift cast protector leaked. The wound is a little bit larger today, as result of moisture-related tissue breakdown. The surface remains fairly fibrotic. 11/30/2022: The wound measured a little bit larger today secondary to additional moisture related periwound breakdown. There is slough on the wound surface. Still fairly fibrotic underneath. 12/05/2022: The ankle wound is a little smaller but still has slough accumulation. She has been approved for Apligraf and we are going to apply that today. 12/15/2022: The wound  is smaller again today. Minimal slough. Edema control is good. Patient History Information obtained from Patient. Family History Cancer - Mother,Father, Diabetes - Father, Heart Disease -  Father, Hypertension - Father, Stroke - Mother, No family history of Hereditary Spherocytosis, Kidney Disease, Lung Disease, Seizures, Thyroid Problems, Tuberculosis. Social History Never smoker, Marital Status - Married, Alcohol Use - Daily - 1.5 bottles of wine daily, Drug Use - No History, Caffeine Use - Daily - coffee. Medical History Eyes Patient has history of Cataracts - mild Denies history of Glaucoma, Optic Neuritis Ear/Nose/Mouth/Throat Denies history of Chronic sinus problems/congestion, Middle ear problems Hematologic/Lymphatic Patient has history of Anemia - iron deficiency, Lymphedema Denies history of Hemophilia, Human Immunodeficiency Virus, Sickle Cell Disease Respiratory Denies history of Aspiration, Asthma, Chronic Obstructive Pulmonary Disease (COPD), Pneumothorax, Sleep Apnea, Tuberculosis Cardiovascular Patient has history of Congestive Heart Failure, Hypertension, Peripheral Venous Disease Denies history of Angina, Arrhythmia, Coronary Artery Disease, Deep Vein Thrombosis, Hypotension, Myocardial Infarction, Peripheral Arterial Disease, Phlebitis, Vasculitis Gastrointestinal Denies history of Cirrhosis , Colitis, Crohnoos, Hepatitis A, Hepatitis B, Hepatitis C Endocrine Denies history of Type I Diabetes, Type II Diabetes Genitourinary Denies history of End Stage Renal Disease Immunological Denies history of Lupus Erythematosus, Raynaudoos, Scleroderma Integumentary (Skin) Denies history of History of Burn Musculoskeletal Patient has history of Osteoarthritis Denies history of Gout, Rheumatoid Arthritis, Osteomyelitis Neurologic Patient has history of Neuropathy - bila Denies history of Dementia, Quadriplegia, Paraplegia, Seizure Disorder Oncologic Denies history of  Received Chemotherapy, Received Radiation Psychiatric Patient has history of Confinement Anxiety Denies history of Anorexia/bulimia Hospitalization/Surgery History - right arm surgery. - gastric bypass. - hysterectomy. - tonsillectomy. - hernia repair. - cervical myelopathy with spinal cord compression. Medical A Surgical History Notes nd Constitutional Symptoms (General Health) obesity Ear/Nose/Mouth/Throat seasonal allergies Cardiovascular aortic murmur, hyperlipidemia, hypertriglyceridemia Gastrointestinal UGI bleed, Endocrine hypothyroidism [name], [name] ([id number]) [id number] Page 8 of 12 Objective Constitutional no acute distress. Vitals Time Taken: 11:07 AM, Height: 65 in, Weight: 229 lbs, BMI: 38.1, Temperature: 97.8 F, Pulse: 71 bpm, Respiratory Rate: 20 breaths/min, Blood Pressure: 140/75 mmHg. Respiratory Normal work of breathing on room air. General Notes: 12/15/2022: The wound is smaller again today. Minimal slough. Edema control is good. Integumentary (Hair, Skin) Wound #6 status is Open. Original cause of wound was Gradually Appeared. The date acquired was: 09/25/2022. The wound has been in treatment 10 weeks. The wound is located on the Right,Lateral Ankle. The wound measures 0.8cm length x 0.8cm width x 0.1cm depth; 0.503cm^2 area and 0.05cm^3 volume. There is Fat Layer (Subcutaneous Tissue) exposed. There is no tunneling or undermining noted. There is a medium amount of serous drainage noted. The wound margin is flat and intact. There is medium (34-66%) pink granulation within the wound bed. There is a medium (34-66%) amount of necrotic tissue within the wound bed including Adherent Slough. The periwound skin appearance had no abnormalities noted for texture. The periwound skin appearance exhibited: Hemosiderin Staining. The periwound skin appearance did not exhibit: Dry/Scaly, Maceration, Erythema. Periwound temperature was noted  as No Abnormality. The periwound has tenderness on palpation. Wound #7 status is Healed - Epithelialized. Original cause of wound was Gradually Appeared. The date acquired was: 12/01/2022. The wound has been in treatment 1 weeks. The wound is located on the Left,Anterior Lower Leg. The wound measures 0cm length x 0cm width x 0cm depth; 0cm^2 area and 0cm^3 volume. There is no tunneling or undermining noted. There is a none present amount of drainage noted. There is no granulation within the wound bed. There is no necrotic tissue within the wound bed. The periwound skin appearance exhibited: Dry/Scaly, Hemosiderin Staining. The periwound skin appearance did not exhibit: Scarring.  Periwound temperature was noted as No Abnormality. Assessment Active Problems ICD-10 Non-pressure chronic ulcer of right ankle with fat layer exposed Venous insufficiency (chronic) (peripheral) Chronic diastolic (congestive) heart failure Other obesity due to excess calories Procedures Wound #6 Pre-procedure diagnosis of Wound #6 is a Venous Leg Ulcer located on the Right,Lateral Ankle .Severity of Tissue Pre Debridement is: Fat layer exposed. There was a Excisional Skin/Subcutaneous Tissue Debridement with a total area of 0.64 sq cm performed by [name] Maudlin, MD. With the following instrument(s): Curette to remove Viable and Non-Viable tissue/material. Material removed includes Subcutaneous Tissue and Slough and after achieving pain control using Lidocaine 4% T opical Solution. No specimens were taken. A time out was conducted at 11:30, prior to the start of the procedure. A Minimum amount of bleeding was controlled with Pressure. The procedure was tolerated well with a pain level of 0 throughout and a pain level of 0 following the procedure. Post Debridement Measurements: 0.8cm length x 0.8cm width x 0.1cm depth; 0.05cm^3 volume. Character of Wound/Ulcer Post Debridement is improved. Severity of Tissue Post  Debridement is: Fat layer exposed. Post procedure Diagnosis Wound #6: Same as Pre-Procedure General Notes: scribed by [name] Gouty, RN for Dr. Celine [name]. Pre-procedure diagnosis of Wound #6 is a Venous Leg Ulcer located on the Right,Lateral Ankle. A skin graft procedure using a bioengineered skin substitute/cellular or tissue based product was performed by [name] Maudlin, MD with the following instrument(s): Blade, Forceps, and Scissors. Apligraf was applied and secured with Steri-Strips. 4 sq cm of product was utilized and 40 sq cm was wasted due to wound size. Post Application, gauze, adaptic was applied. A Time Out was conducted at 11:30, prior to the start of the procedure. The procedure was tolerated well with a pain level of 0 throughout and a pain level of 0 following the procedure. Post procedure Diagnosis Wound #6: Same as Pre-Procedure General Notes: scribed by [name] Gouty, RN for Dr. Celine [name]. Pre-procedure diagnosis of Wound #6 is a Venous Leg Ulcer located on the Right,Lateral Ankle . There was a Three Layer Compression Therapy Procedure by [name] Gouty, RN. Post procedure Diagnosis Wound #6: Same as Pre-Procedure Plan [name], [name] ([id number]) [id number] Page 9 of 12 Follow-up Appointments: Return Appointment in 1 week. - Dr. Celine [name] Room 1 Return Appointment in 2 weeks. - Dr. Celine [name] RM 1 Friday 2/2 @ 12:30 pm Nurse Visit: - Friday 1/26 @ 2:15 pm RM 1 post apligraf Anesthetic: (In clinic) Topical Lidocaine 4% applied to wound bed Cellular or Tissue Based Products: Wound #6 Right,Lateral Ankle: Cellular or Tissue Based Product Type: - APLIGRAF #2 Cellular or Tissue Based Product applied to wound bed, secured with steri-strips, cover with Adaptic or Mepitel. (DO NOT REMOVE). Bathing/ Shower/ Hygiene: May shower with protection but do not get wound dressing(s) wet. Protect dressing(s) with water repellant cover (for example, large plastic  bag) or a cast cover and may then take shower. - may purchase cast protector at [facility], [facility] or [employer] Edema Control - Lymphedema / SCD / Other: Elevate legs to the level of the heart or above for 30 minutes daily and/or when sitting for 3-4 times a day throughout the day. Avoid standing for long periods of time. Patient to wear own compression stockings every day. Moisturize legs daily. WOUND #6: - Ankle Wound Laterality: Right, Lateral Cleanser: Soap and Water 1 x Per Week/30 Days Discharge Instructions: May shower and wash wound with dial antibacterial soap and water prior to dressing change. Peri-Wound Care: Zinc Oxide  Ointment 30g tube 1 x Per Week/30 Days Discharge Instructions: Apply Zinc Oxide to periwound with each dressing change Peri-Wound Care: Sween Lotion (Moisturizing lotion) 1 x Per Week/30 Days Discharge Instructions: Apply moisturizing lotion as directed Prim Dressing: APLIGRAF 1 x Per Week/30 Days ary Secondary Dressing: Woven Gauze Sponge, Non-Sterile 4x4 in 1 x Per Week/30 Days Discharge Instructions: Apply over primary dressing as directed. Secured With: Transpore Surgical T ape, 2x10 (in/yd) 1 x Per Week/30 Days Discharge Instructions: Secure dressing with tape as directed. Com pression Wrap: ThreePress (3 layer compression wrap) 1 x Per Week/30 Days Discharge Instructions: Apply three layer compression as directed. 12/15/2022: The wound is smaller again today. Minimal slough. Edema control is good. I used a curette to debride slough and nonviable subcutaneous tissue from the wound surface. I then prepared the Apligraf skin substitute, fenestrated it and applied it to the wound. It was secured in situ with Adaptic and Steri-Strips. A gauze sponge was used as a bolster. 3 layer compression was applied. We will have her follow-up next week for nurse visit to have her wrap changed and she will see me in 2 weeks for Apligraf placement #3. Electronic Signature(s) Signed:  12/15/2022 11:50:04 AM By: [name] Maudlin MD FACS Entered By: [name] on 12/15/2022 11:50:04 -------------------------------------------------------------------------------- HxROS Details Patient Name: Date of Service: [name], [name]. 12/15/2022 11:00 A M Medical Record Number: [id number] Patient Account Number: [id number] Date of Birth/Sex: Treating RN: [birth year] (77 y.o. F) Primary Care Provider: Milus Mallick, MA [name] Other Clinician: Referring Provider: Treating Provider/Extender: [name] Door, MA [name] Weeks in Treatment: 10 Information Obtained From Patient Constitutional Symptoms (General Health) Medical History: Past Medical History Notes: obesity Eyes Medical History: Positive for: Cataracts - mild Negative for: Glaucoma; Optic Neuritis Ear/Nose/Mouth/Throat [name], [name] ([id number]) [id number] Page 10 of 12 Medical History: Negative for: Chronic sinus problems/congestion; Middle ear problems Past Medical History Notes: seasonal allergies Hematologic/Lymphatic Medical History: Positive for: Anemia - iron deficiency; Lymphedema Negative for: Hemophilia; Human Immunodeficiency Virus; Sickle Cell Disease Respiratory Medical History: Negative for: Aspiration; Asthma; Chronic Obstructive Pulmonary Disease (COPD); Pneumothorax; Sleep Apnea; Tuberculosis Cardiovascular Medical History: Positive for: Congestive Heart Failure; Hypertension; Peripheral Venous Disease Negative for: Angina; Arrhythmia; Coronary Artery Disease; Deep Vein Thrombosis; Hypotension; Myocardial Infarction; Peripheral Arterial Disease; Phlebitis; Vasculitis Past Medical History Notes: aortic murmur, hyperlipidemia, hypertriglyceridemia Gastrointestinal Medical History: Negative for: Cirrhosis ; Colitis; Crohns; Hepatitis A; Hepatitis B; Hepatitis C Past Medical History Notes: UGI bleed, Endocrine Medical History: Negative for: Type I  Diabetes; Type II Diabetes Past Medical History Notes: hypothyroidism Genitourinary Medical History: Negative for: End Stage Renal Disease Immunological Medical History: Negative for: Lupus Erythematosus; Raynauds; Scleroderma Integumentary (Skin) Medical History: Negative for: History of Burn Musculoskeletal Medical History: Positive for: Osteoarthritis Negative for: Gout; Rheumatoid Arthritis; Osteomyelitis Neurologic Medical History: Positive for: Neuropathy - bila Negative for: Dementia; Quadriplegia; Paraplegia; Seizure Disorder Oncologic Medical History: Negative for: Received Chemotherapy; Received Radiation Psychiatric Medical History: Positive for: Confinement Anxiety Negative for: Anorexia/bulimia HBO Extended History Items Eyes: RUTHELL, OSTROW ([id number]) [id number] Page 11 of 12 Cataracts Immunizations Pneumococcal Vaccine: Received Pneumococcal Vaccination: No Implantable Devices No devices added Hospitalization / Surgery History Type of Hospitalization/Surgery right arm surgery gastric bypass hysterectomy tonsillectomy hernia repair cervical myelopathy with spinal cord compression Family and Social History Cancer: Yes - Mother,Father; Diabetes: Yes - Father; Heart Disease: Yes - Father; Hereditary Spherocytosis: No; Hypertension: Yes - Father; Kidney Disease: No; Lung Disease: No; Seizures: No; Stroke: Yes -  Mother; Thyroid Problems: No; Tuberculosis: No; Never smoker; Marital Status - Married; Alcohol Use: Daily - 1.5 bottles of wine daily; Drug Use: No History; Caffeine Use: Daily - coffee; Financial Concerns: No; Food, Clothing or Shelter Needs: No; Support System Lacking: No; Transportation Concerns: No Electronic Signature(s) Signed: 12/15/2022 11:55:57 AM By: [name] Maudlin MD FACS Entered By: [name] on 12/15/2022  11:47:37 -------------------------------------------------------------------------------- SuperBill Details Patient Name: Date of Service: [name], [name]. 12/15/2022 Medical Record Number: [id number] Patient Account Number: [id number] Date of Birth/Sex: Treating RN: 1946-05-19 (77 y.o. F) Primary Care Provider: Milus Mallick, MA [name] Other Clinician: Referring Provider: Treating Provider/Extender: [name] Door, MA [name] Weeks in Treatment: 10 Diagnosis Coding ICD-10 Codes Code Description [id number] Non-pressure chronic ulcer of right ankle with fat layer exposed I87.2 Venous insufficiency (chronic) (peripheral) 99991111 Chronic diastolic (congestive) heart failure E66.09 Other obesity due to excess calories Facility Procedures : CPT4 Code: [id number] Description: (Facility Use Only) Apligraf 1 SQ CM Modifier: Quantity: 44 : CPT4 Code: [id number] Description: [id number] - SKIN SUB GRAFT TRNK/ARM/LEG ICD-10 Diagnosis Description [id number] Non-pressure chronic ulcer of right ankle with fat layer exposed Modifier: Quantity: 1 Physician Procedures : CPT4 Code Description Modifier [id number] - WC PHYS LEVEL 3 - EST PT 25 ICD-10 Diagnosis Description [id number] Non-pressure chronic ulcer of right ankle with fat layer exposed I87.2 Venous insufficiency (chronic) (peripheral) 99991111 Chronic diastolic  (congestive) heart failure E66.09 Other obesity due to excess calories Dejarnett, Armanii [name] ([id number]) [phone] Quantity: 1 .pdf Page 12 of 12 : [id number] - WC PHYS SKIN SUB GRAFT TRNK/ARM/LEG 1 ICD-10 Diagnosis Description L97.312 Non-pressure chronic ulcer of right ankle with fat layer exposed Quantity: Electronic Signature(s) Signed: 12/15/2022 11:50:22 AM By: [name] Maudlin MD FACS Entered By: [name] on 12/15/2022 11:50:22

## 2022-12-18 ENCOUNTER — Ambulatory Visit (INDEPENDENT_AMBULATORY_CARE_PROVIDER_SITE_OTHER): Payer: PPO | Admitting: Sports Medicine

## 2022-12-18 ENCOUNTER — Encounter: Payer: Self-pay | Admitting: Sports Medicine

## 2022-12-18 ENCOUNTER — Ambulatory Visit (INDEPENDENT_AMBULATORY_CARE_PROVIDER_SITE_OTHER): Payer: PPO

## 2022-12-18 VITALS — BP 157/81 | HR 70

## 2022-12-18 DIAGNOSIS — M170 Bilateral primary osteoarthritis of knee: Secondary | ICD-10-CM

## 2022-12-18 MED ORDER — DOXYCYCLINE HYCLATE 100 MG PO TABS: 100.0000 mg | ORAL_TABLET | Freq: Two times a day (BID) | ORAL | 0 refills | Status: AC

## 2022-12-18 MED ORDER — TRIAMCINOLONE ACETONIDE 40 MG/ML IJ SUSP
80.0000 mg | Freq: Once | INTRAMUSCULAR | Status: AC
  Administered 2022-12-18: 80 mg via INTRAMUSCULAR

## 2022-12-18 NOTE — Progress Notes (Signed)
    Procedures performed today:    Procedure: Real-time Ultrasound Guided injection of the right knee Device: Samsung HS60  Verbal informed consent obtained.  Time-out conducted.  Noted no overlying erythema, induration, or other signs of local infection.  Skin prepped in a sterile fashion.  Local anesthesia: Topical Ethyl chloride.  With sterile technique and under real time ultrasound guidance: Trace effusion noted, 1 cc Kenalog 40, 2 cc lidocaine, 2 cc bupivacaine injected easily Completed without difficulty  Advised to call if fevers/chills, erythema, induration, drainage, or persistent bleeding.  Images permanently stored and available for review in PACS.  Impression: Technically successful ultrasound guided injection.  Procedure: Real-time Ultrasound Guided injection of the left knee Device: Samsung HS60  Verbal informed consent obtained.  Time-out conducted.  Noted no overlying erythema, induration, or other signs of local infection.  Skin prepped in a sterile fashion.  Local anesthesia: Topical Ethyl chloride.  With sterile technique and under real time ultrasound guidance: Moderate effusion noted, 1 cc Kenalog 40, 2 cc lidocaine, 2 cc bupivacaine injected easily Completed without difficulty  Advised to call if fevers/chills, erythema, induration, drainage, or persistent bleeding.  Images permanently stored and available for review in PACS.  Impression: Technically successful ultrasound guided injection.  Independent interpretation of notes and tests performed by another provider:   None.  Brief History, Exam, Impression, and Recommendations:    Primary osteoarthritis of both knees Lower extremity cellulitis has resolved, we did bilateral aspirations and injections today. Adding another course of doxycycline to ensure things go well. Return to see me as needed.    ____________________________________________ Gwen Her. Dianah Field, M.D., ABFM., CAQSM.,  AME. Primary Care and Sports Medicine  [facility]  Adjunct Professor of [place] [employer] [facility] [employer]  Risk manager

## 2022-12-18 NOTE — Assessment & Plan Note (Addendum)
Lower extremity cellulitis has resolved, we did bilateral aspirations and injections today. Adding another course of doxycycline to ensure things go well. Return to see me as needed.

## 2022-12-18 NOTE — Addendum Note (Signed)
Addended by: Tarri Glenn A on: 12/18/2022 03:06 PM   Modules accepted: Orders

## 2022-12-22 ENCOUNTER — Encounter (HOSPITAL_BASED_OUTPATIENT_CLINIC_OR_DEPARTMENT_OTHER): Payer: PPO | Admitting: General Surgery

## 2022-12-22 DIAGNOSIS — L97312 Non-pressure chronic ulcer of right ankle with fat layer exposed: Secondary | ICD-10-CM | POA: Diagnosis not present

## 2022-12-22 NOTE — Progress Notes (Addendum)
[name], [name] ([id number]) [id number] Page 1 of 4 Visit Report for 12/22/2022 Arrival Information Details Patient Name: Date of Service: [name], [name] 12/22/2022 2:15 PM Medical Record Number: [id number] Patient Account Number: [id number] Date of Birth/Sex: Treating RN: 1946-09-09 (77 y.o. F) Zochol, [name] Primary Care Maalle Starrett: Milus Mallick, MA RITZA Other Clinician: Referring Murvin Gift: Treating Michaeljohn Biss/Extender: Terese Door, MA RITZA Weeks in Treatment: 11 Visit Information History Since Last Visit All ordered tests and consults were completed: No Patient Arrived: Cane Added or deleted any medications: No Arrival Time: 14:10 Any new allergies or adverse reactions: No Accompanied By: husband Had a fall or experienced change in No Transfer Assistance: None activities of daily living that may affect Patient Identification Verified: Yes risk of falls: Secondary Verification Process Completed: Yes Signs or symptoms of abuse/neglect since last visito No Patient Requires Transmission-Based Precautions: No Hospitalized since last visit: No Patient Has Alerts: No Implantable device outside of the clinic excluding No cellular tissue based products placed in the center since last visit: Pain Present Now: No Electronic Signature(s) Signed: 12/22/2022 2:56:43 PM By: Blanche East RN Entered By: Blanche East on 12/22/2022 14:56:42 -------------------------------------------------------------------------------- Compression Therapy Details Patient Name: Date of Service: [name], [name] Balo H. 12/22/2022 2:15 PM Medical Record Number: [id number] Patient Account Number: [id number] Date of Birth/Sex: Treating RN: Mar 31, 1946 (77 y.o. [name], [name] Primary Care Meloney Feld: Milus Mallick, MA RITZA Other Clinician: Referring Deondra Wigger: Treating Hamed Debella/Extender: Terese Door, MA RITZA Weeks in Treatment: 11 Compression Therapy Performed  for Wound Assessment: Wound #6 Right,Lateral Ankle Performed By: Clinician Blanche East, RN Compression Type: Three Layer Electronic Signature(s) Signed: 12/22/2022 2:57:32 PM By: Blanche East RN Entered By: Blanche East on 12/22/2022 14:57:32 -------------------------------------------------------------------------------- Encounter Discharge Information Details Patient Name: Date of Service: [name], [name] Balo H. 12/22/2022 2:15 PM Medical Record Number: [id number] Patient Account Number: [id number] Date of Birth/Sex: Treating RN: October 18, 1946 (77 y.o. [name], [place] H ([id number]) [phone].pdf Page 2 of 4 Primary Care Cydne Grahn: Milus Mallick, MA RITZA Other Clinician: Referring Holley Wirt: Treating Mikailah Morel/Extender: Terese Door, MA RITZA Weeks in Treatment: 11 Encounter Discharge Information Items Discharge Condition: Stable Ambulatory Status: Cane Discharge Destination: Home Transportation: Private Auto Accompanied By: spouse Schedule Follow-up Appointment: Yes Clinical Summary of Care: Electronic Signature(s) Signed: 12/22/2022 2:58:16 PM By: Blanche East RN Entered By: Blanche East on 12/22/2022 14:58:16 -------------------------------------------------------------------------------- Patient/Caregiver Education Details Patient Name: Date of Service: [name], [name] 1/26/2024andnbsp2:15 PM Medical Record Number: [id number] Patient Account Number: [id number] Date of Birth/Gender: Treating RN: [birth year] (76 y.o. [name] Primary Care Physician: Milus Mallick, MA RITZA Other Clinician: Referring Physician: Treating Physician/Extender: Terese Door, MA RITZA Weeks in Treatment: 11 Education Assessment Education Provided To: Patient Education Topics Provided Wound/Skin Impairment: Methods: Explain/Verbal Responses: Reinforcements needed, State content correctly Electronic Signature(s) Signed:  12/22/2022 3:30:08 PM By: Blanche East RN Entered By: Blanche East on 12/22/2022 14:58:02 -------------------------------------------------------------------------------- Wound Assessment Details Patient Name: Date of Service: [name], [name] 12/22/2022 2:15 PM Medical Record Number: [id number] Patient Account Number: [id number] Date of Birth/Sex: Treating RN: 07/17/1946 (77 y.o. F) Zochol, [name] Primary Care Yaquelin Langelier: Milus Mallick, MA RITZA Other Clinician: Referring Fortunato Nordin: Treating Tenley Winward/Extender: Terese Door, MA RITZA Weeks in Treatment: 11 Wound Status Wound Number: 6 Primary Venous Leg Ulcer Etiology: Wound Location: Right, Lateral Ankle Wound Open Wounding Event: Gradually Appeared Status: Date Acquired: 09/25/2022 Comorbid Cataracts, Anemia, Lymphedema, Congestive Heart  Failure, Weeks Of Treatment: [address], [place] H ([id number]) [id number] Page 3 of 4 Weeks Of Treatment: 11 History: Hypertension, Peripheral Venous Disease, Osteoarthritis, Clustered Wound: No Neuropathy, Confinement Anxiety Wound Measurements Length: (cm) 0.8 Width: (cm) 0.8 Depth: (cm) 0.1 Area: (cm) 0.503 Volume: (cm) 0.05 % Reduction in Area: 51.5% % Reduction in Volume: 51.9% Epithelialization: Small (1-33%) Tunneling: No Undermining: No Wound Description Classification: Full Thickness Without Exposed Support Structures Wound Margin: Flat and Intact Exudate Amount: Medium Exudate Type: Serous Exudate Color: amber Foul Odor After Cleansing: No Slough/Fibrino Yes Wound Bed Granulation Amount: Medium (34-66%) Exposed Structure Granulation Quality: Pink Fascia Exposed: No Necrotic Amount: Medium (34-66%) Fat Layer (Subcutaneous Tissue) Exposed: Yes Necrotic Quality: Adherent Slough Tendon Exposed: No Muscle Exposed: No Joint Exposed: No Bone Exposed: No Periwound Skin Texture Texture Color No Abnormalities Noted: Yes No  Abnormalities Noted: No Erythema: No Moisture Hemosiderin Staining: Yes No Abnormalities Noted: No Dry / Scaly: No Temperature / Pain Maceration: No Temperature: No Abnormality Tenderness on Palpation: Yes Treatment Notes Wound #6 (Ankle) Wound Laterality: Right, Lateral Cleanser Soap and Water Discharge Instruction: May shower and wash wound with dial antibacterial soap and water prior to dressing change. Peri-Wound Care Zinc Oxide Ointment 30g tube Discharge Instruction: Apply Zinc Oxide to periwound with each dressing change Sween Lotion (Moisturizing lotion) Discharge Instruction: Apply moisturizing lotion as directed Topical Primary Dressing APLIGRAF Secondary Dressing Woven Gauze Sponge, Non-Sterile 4x4 in Discharge Instruction: Apply over primary dressing as directed. Secured With Transpore Surgical Tape, 2x10 (in/yd) Discharge Instruction: Secure dressing with tape as directed. Compression Wrap ThreePress (3 layer compression wrap) Discharge Instruction: Apply three layer compression as directed. Compression Stockings Add-Ons Electronic Signature(s) Signed: 12/22/2022 2:57:17 PM By: Blanche East RN Entered By: Blanche East on 12/22/2022 14:57:16 [name] ([id number]) [id number] Page 4 of 4 -------------------------------------------------------------------------------- Vitals Details Patient Name: Date of Service: MARCIE, SHEARON 12/22/2022 2:15 PM Medical Record Number: [id number] Patient Account Number: [id number] Date of Birth/Sex: Treating RN: [birth year] (77 y.o. F) Primary Care Shamarion Coots: Milus Mallick, MA RITZA Other Clinician: Referring Sebrina Kessner: Treating Willy Pinkerton/Extender: Terese Door, MA RITZA Weeks in Treatment: 11 Vital Signs Time Taken: 02:10 Temperature (F): 97.9 Height (in): 65 Pulse (bpm): 75 Weight (lbs): 229 Respiratory Rate (breaths/min): 20 Body Mass Index (BMI): 38.1 Blood Pressure  (mmHg): 175/95 Reference Range: 80 - 120 mg / dl Electronic Signature(s) Signed: 12/22/2022 2:56:47 PM By: Blanche East RN Entered By: Blanche East on 12/22/2022 14:56:47

## 2022-12-22 NOTE — Progress Notes (Signed)
[name], [name] ([id number]) [id number] Page 1 of 1 Visit Report for 12/22/2022 SuperBill Details Patient Name: Date of Service: [name], [name] 12/22/2022 Medical Record Number: [id number] Patient Account Number: [id number] Date of Birth/Sex: Treating RN: [birth year] (77 y.o. [name], [name] Primary Care Provider: Milus Mallick, MA RITZA Other Clinician: Referring Provider: Treating Provider/Extender: Terese Door, MA RITZA Weeks in Treatment: 11 Diagnosis Coding ICD-10 Codes Code Description W73.710 Non-pressure chronic ulcer of right ankle with fat layer exposed I87.2 Venous insufficiency (chronic) (peripheral) G26.94 Chronic diastolic (congestive) heart failure E66.09 Other obesity due to excess calories Facility Procedures CPT4 Code Description Modifier Quantity [id number] (Facility Use Only) [phone] - APPLY Harman LWR RT LEG 1 ICD-10 Diagnosis Description W29.937 Non-pressure chronic ulcer of right ankle with fat layer exposed Electronic Signature(s) Signed: 12/22/2022 2:58:34 PM By: Blanche East RN Signed: 12/22/2022 3:31:20 PM By: Fredirick Maudlin MD FACS Entered By: Blanche East on 12/22/2022 14:58:34

## 2022-12-26 ENCOUNTER — Other Ambulatory Visit: Payer: Self-pay | Admitting: Nurse Practitioner

## 2022-12-26 DIAGNOSIS — E782 Mixed hyperlipidemia: Secondary | ICD-10-CM

## 2022-12-29 ENCOUNTER — Encounter (HOSPITAL_BASED_OUTPATIENT_CLINIC_OR_DEPARTMENT_OTHER): Payer: PPO | Attending: General Surgery | Admitting: General Surgery

## 2022-12-29 DIAGNOSIS — I5032 Chronic diastolic (congestive) heart failure: Secondary | ICD-10-CM | POA: Insufficient documentation

## 2022-12-29 DIAGNOSIS — L97312 Non-pressure chronic ulcer of right ankle with fat layer exposed: Secondary | ICD-10-CM | POA: Insufficient documentation

## 2022-12-29 DIAGNOSIS — I872 Venous insufficiency (chronic) (peripheral): Secondary | ICD-10-CM | POA: Insufficient documentation

## 2022-12-29 DIAGNOSIS — E668 Other obesity: Secondary | ICD-10-CM | POA: Insufficient documentation

## 2022-12-29 DIAGNOSIS — Z6838 Body mass index (BMI) 38.0-38.9, adult: Secondary | ICD-10-CM | POA: Diagnosis not present

## 2022-12-29 NOTE — Progress Notes (Signed)
ELVIS, BOOT ([id number]) [id number] Page 1 of 8 Visit Report for 12/29/2022 Arrival Information Details Patient Name: Date of Service: [name], [name] 12/29/2022 12:30 PM Medical Record Number: [id number] Patient Account Number: [id number] Date of Birth/Sex: Treating RN: 08/27/1946 (77 y.o. F) Zochol, [name] Primary Care Jameila Keeny: Milus Mallick, MA RITZA Other Clinician: Referring Cecillia Menees: Treating Pinky Ravan/Extender: Terese Door, MA RITZA Weeks in Treatment: 12 Visit Information History Since Last Visit Added or deleted any medications: No Patient Arrived: Cane Any [name] allergies or adverse reactions: No Arrival Time: 12:44 Had a fall or experienced change in No Accompanied By: husband activities of daily living that may affect Transfer Assistance: None risk of falls: Patient Requires Transmission-Based Precautions: No Signs or symptoms of abuse/neglect since last visito No Patient Has Alerts: No Hospitalized since last visit: No Implantable device outside of the clinic excluding No cellular tissue based products placed in the center since last visit: Has Compression in Place as Prescribed: Yes Pain Present Now: No Electronic Signature(s) Signed: 12/29/2022 5:05:53 PM By: Blanche East RN Entered By: Blanche East on 12/29/2022 12:45:14 -------------------------------------------------------------------------------- Compression Therapy Details Patient Name: Date of Service: [name], [name] 12/29/2022 12:30 PM Medical Record Number: [id number] Patient Account Number: [id number] Date of Birth/Sex: Treating RN: March 20, 1946 (77 y.o. F) Zochol, [name] Primary Care Isidoro Santillana: Milus Mallick, MA RITZA Other Clinician: Referring Willliam Pettet: Treating Candie Gintz/Extender: Terese Door, MA RITZA Weeks in Treatment: 12 Compression Therapy Performed for Wound Assessment: Wound #6 Right,Lateral Ankle Performed By: Clinician Blanche East,  RN Compression Type: Double Layer Post Procedure Diagnosis Same as Pre-procedure Notes Scribed for Dr. Celine Ahr by Blanche East, RN Electronic Signature(s) Signed: 12/29/2022 5:05:53 PM By: Blanche East RN Entered By: Blanche East on 12/29/2022 13:12:28 [name] ([id number]) [id number] Page 2 of 8 -------------------------------------------------------------------------------- Encounter Discharge Information Details Patient Name: Date of Service: [name], [name] 12/29/2022 12:30 PM Medical Record Number: [id number] Patient Account Number: [id number] Date of Birth/Sex: Treating RN: May 08, 1946 (77 y.o. F) Zochol, [name] Primary Care Matha Masse: Milus Mallick, MA RITZA Other Clinician: Referring Yaden Seith: Treating Abhiram Criado/Extender: Terese Door, MA RITZA Weeks in Treatment: 12 Encounter Discharge Information Items Post Procedure Vitals Discharge Condition: Stable Temperature (F): 97.9 Ambulatory Status: Cane Pulse (bpm): 66 Discharge Destination: Home Respiratory Rate (breaths/min): 18 Transportation: Private Auto Blood Pressure (mmHg): 150/83 Accompanied By: self Schedule Follow-up Appointment: Yes Clinical Summary of Care: Electronic Signature(s) Signed: 12/29/2022 5:03:40 PM By: Blanche East RN Entered By: Blanche East on 12/29/2022 17:03:40 -------------------------------------------------------------------------------- Lower Extremity Assessment Details Patient Name: Date of Service: [name], [name] 12/29/2022 12:30 PM Medical Record Number: [id number] Patient Account Number: [id number] Date of Birth/Sex: Treating RN: 05-May-1946 (77 y.o. F) Zochol, [name] Primary Care Rosangelica Pevehouse: Milus Mallick, MA RITZA Other Clinician: Referring Aviya Jarvie: Treating Merla Sawka/Extender: Terese Door, MA RITZA Weeks in Treatment: 12 Edema Assessment Assessed: [Left: No] [Right: No] Edema: [Left: Ye] [Right: s] Calf Left: Right: Point  of Measurement: From Medial Instep 33 cm Ankle Left: Right: Point of Measurement: From Medial Instep 20.4 cm Vascular Assessment Pulses: Dorsalis Pedis Palpable: [Right:Yes] Electronic Signature(s) Signed: 12/29/2022 5:05:53 PM By: Blanche East RN Entered By: Blanche East on 12/29/2022 12:47:04 [name] ([id number]) [id number] Page 3 of 8 -------------------------------------------------------------------------------- Multi Wound Chart Details Patient Name: Date of Service: [name], [name] 12/29/2022 12:30 PM Medical Record Number: [id number] Patient Account Number: [id number] Date of Birth/Sex: Treating RN: 10/11/1946 (77 y.o. F) Primary Care  Kapri Nero: Driscilla Grammes, MA RITZA Other Clinician: Referring Ailsa Mireles: Treating Gavin Faivre/Extender: Hulen Skains, MA RITZA Weeks in Treatment: 12 Vital Signs Height(in): 65 Pulse(bpm): 66 Weight(lbs): 229 Blood Pressure(mmHg): 150/83 Body Mass Index(BMI): 38.1 Temperature(F): 97.9 Respiratory Rate(breaths/min): 18 [6:Photos:] [N/A:N/A] Right, Lateral Ankle N/A N/A Wound Location: Gradually Appeared N/A N/A Wounding Event: Venous Leg Ulcer N/A N/A Primary Etiology: Cataracts, Anemia, Lymphedema, N/A N/A Comorbid History: Congestive Heart Failure, Hypertension, Peripheral Venous Disease, Osteoarthritis, Neuropathy, Confinement Anxiety 09/25/2022 N/A N/A Date Acquired: 12 N/A N/A Weeks of Treatment: Open N/A N/A Wound Status: No N/A N/A Wound Recurrence: 0.7x0.5x0.1 N/A N/A Measurements L x W x D (cm) 0.275 N/A N/A A (cm) : rea 0.027 N/A N/A Volume (cm) : 73.50% N/A N/A % Reduction in A rea: 74.00% N/A N/A % Reduction in Volume: Full Thickness Without Exposed N/A N/A Classification: Support Structures Medium N/A N/A Exudate A mount: Serous N/A N/A Exudate Type: amber N/A N/A Exudate Color: Flat and Intact N/A N/A Wound Margin: Medium (34-66%) N/A  N/A Granulation A mount: Pink N/A N/A Granulation Quality: Medium (34-66%) N/A N/A Necrotic A mount: Fat Layer (Subcutaneous Tissue): Yes N/A N/A Exposed Structures: Fascia: No Tendon: No Muscle: No Joint: No Bone: No Small (1-33%) N/A N/A Epithelialization: Debridement - Selective/Open Wound N/A N/A Debridement: Pre-procedure Verification/Time Out 13:05 N/A N/A Taken: Lidocaine 5% topical ointment N/A N/A Pain Control: Slough N/A N/A Tissue Debrided: Non-Viable Tissue N/A N/A Level: 0.35 N/A N/A Debridement A (sq cm): rea Curette N/A N/A Instrument: Minimum N/A N/A Bleeding: Pressure N/A N/A Hemostasis A chieved: Procedure was tolerated well N/A N/A Debridement Treatment Response: 0.7x0.5x0.1 N/A N/A Post Debridement Measurements L x W x D (cm) 0.027 N/A N/A Post Debridement Volume: (cm) Dickie, Phyliss [name] ([id number]) [id number] Page 4 of 8 No Abnormalities Noted N/A N/A Periwound Skin Texture: Maceration: No N/A N/A Periwound Skin Moisture: Dry/Scaly: No Hemosiderin Staining: Yes N/A N/A Periwound Skin Color: Erythema: No No Abnormality N/A N/A Temperature: Yes N/A N/A Tenderness on Palpation: Cellular or Tissue Based Product N/A N/A Procedures Performed: Compression Therapy Debridement Treatment Notes Wound #6 (Ankle) Wound Laterality: Right, Lateral Cleanser Soap and Water Discharge Instruction: May shower and wash wound with dial antibacterial soap and water prior to dressing change. Peri-Wound Care Zinc Oxide Ointment 30g tube Discharge Instruction: Apply Zinc Oxide to periwound with each dressing change Sween Lotion (Moisturizing lotion) Discharge Instruction: Apply moisturizing lotion as directed Topical Primary Dressing APLIGRAF Secondary Dressing Woven Gauze Sponge, Non-Sterile 4x4 in Discharge Instruction: Apply over primary dressing as directed. Secured With Transpore Surgical Tape, 2x10  (in/yd) Discharge Instruction: Secure dressing with tape as directed. Compression Wrap ThreePress (3 layer compression wrap) Discharge Instruction: Apply three layer compression as directed. Compression Stockings Add-Ons Electronic Signature(s) Signed: 12/29/2022 1:14:22 PM By: Duanne Guess MD FACS Entered By: Duanne Guess on 12/29/2022 13:14:21 -------------------------------------------------------------------------------- Multi-Disciplinary Care Plan Details Patient Name: Date of Service: [name], [name] [place] [name]. 12/29/2022 12:30 PM Medical Record Number: [id number] Patient Account Number: [id number] Date of Birth/Sex: Treating RN: [birth year] (77 y.o. [name] Primary Care Loreli Debruler: Driscilla Grammes, MA RITZA Other Clinician: Referring Tiauna Whisnant: Treating Jakayla Schweppe/Extender: Hulen Skains, MA RITZA Weeks in Treatment: 12 Active Inactive Nutrition Nursing Diagnoses: Potential for alteratiion in Nutrition/Potential for imbalanced nutrition Jardin, Sundus [name] ([id number]) [phone].pdf Page 5 of 8 Goals: Patient/caregiver agrees to and verbalizes understanding of need to use nutritional supplements and/or vitamins as prescribed Date Initiated: 10/13/2022 Target Resolution Date: 02/25/2023 Goal Status: Active  Interventions: Provide education on nutrition Treatment Activities: Dietary management education, guidance and counseling : 10/13/2022 Education provided on Nutrition : 11/10/2022 Notes: Wound/Skin Impairment Nursing Diagnoses: Impaired tissue integrity Goals: Ulcer/skin breakdown will have a volume reduction of 30% by week 4 Date Initiated: 10/13/2022 Target Resolution Date: 02/25/2023 Goal Status: Active Interventions: Provide education on ulcer and skin care Notes: Electronic Signature(s) Signed: 12/29/2022 5:05:53 PM By: Blanche East RN Entered By: Blanche East on 12/29/2022  13:05:15 -------------------------------------------------------------------------------- Pain Assessment Details Patient Name: Date of Service: [name], [name] 12/29/2022 12:30 PM Medical Record Number: [id number] Patient Account Number: [id number] Date of Birth/Sex: Treating RN: 1946/06/11 (77 y.o. F) Zochol, [name] Primary Care Samaya Boardley: Milus Mallick, MA RITZA Other Clinician: Referring Deaja Rizo: Treating Azaiah Licciardi/Extender: Terese Door, MA RITZA Weeks in Treatment: 12 Active Problems Location of Pain Severity and Description of Pain Patient Has Paino No Site Locations Rate the pain. Current Pain Level: 0 Worst Pain Level: 8 Character of Pain Describe the Pain: Burning Agcaoili, Tyrianna [name] ([id number]) [phone].pdf Page 6 of 8 Pain Management and Medication Current Pain Management: Electronic Signature(s) Signed: 12/29/2022 5:05:53 PM By: Blanche East RN Entered By: Blanche East on 12/29/2022 12:45:37 -------------------------------------------------------------------------------- Patient/Caregiver Education Details Patient Name: Date of Service: [name], [name] 2/2/2024andnbsp12:30 PM Medical Record Number: [id number] Patient Account Number: [id number] Date of Birth/Gender: Treating RN: 10/03/1946 (77 y.o. F) Zochol, [name] Primary Care Physician: Milus Mallick, MA RITZA Other Clinician: Referring Physician: Treating Physician/Extender: Terese Door, MA RITZA Weeks in Treatment: 12 Education Assessment Education Provided To: Patient Education Topics Provided Wound Debridement: Methods: Explain/Verbal Responses: Reinforcements needed, State content correctly Wound/Skin Impairment: Methods: Explain/Verbal Responses: Reinforcements needed, State content correctly Electronic Signature(s) Signed: 12/29/2022 5:05:53 PM By: Blanche East RN Entered By: Blanche East on 12/29/2022  13:05:32 -------------------------------------------------------------------------------- Wound Assessment Details Patient Name: Date of Service: [name], [name]. 12/29/2022 12:30 PM Medical Record Number: [id number] Patient Account Number: [id number] Date of Birth/Sex: Treating RN: February 12, 1946 (77 y.o. F) Zochol, [name] Primary Care Kenn Rekowski: Milus Mallick, MA RITZA Other Clinician: Referring Jamire Shabazz: Treating Navada Osterhout/Extender: Terese Door, MA RITZA Weeks in Treatment: 12 Wound Status Wound Number: 6 Primary Venous Leg Ulcer Etiology: Wound Location: Right, Lateral Ankle Wound Open Wounding Event: Gradually Appeared Status: Date Acquired: 09/25/2022 Comorbid Cataracts, Anemia, Lymphedema, Congestive Heart Failure, Weeks Of Treatment: 12 History: Hypertension, Peripheral Venous Disease, Osteoarthritis, Clustered Wound: No Neuropathy, Confinement Anxiety Photos [name], [name] ([id number]) [phone].pdf Page 7 of 8 Wound Measurements Length: (cm) 0.7 Width: (cm) 0.5 Depth: (cm) 0.1 Area: (cm) 0.275 Volume: (cm) 0.027 % Reduction in Area: 73.5% % Reduction in Volume: 74% Epithelialization: Small (1-33%) Tunneling: No Undermining: No Wound Description Classification: Full Thickness Without Exposed Support Structures Wound Margin: Flat and Intact Exudate Amount: Medium Exudate Type: Serous Exudate Color: amber Foul Odor After Cleansing: No Slough/Fibrino Yes Wound Bed Granulation Amount: Medium (34-66%) Exposed Structure Granulation Quality: Pink Fascia Exposed: No Necrotic Amount: Medium (34-66%) Fat Layer (Subcutaneous Tissue) Exposed: Yes Necrotic Quality: Adherent Slough Tendon Exposed: No Muscle Exposed: No Joint Exposed: No Bone Exposed: No Periwound Skin Texture Texture Color No Abnormalities Noted: Yes No Abnormalities Noted: No Erythema: No Moisture Hemosiderin Staining: Yes No Abnormalities Noted: No Dry /  Scaly: No Temperature / Pain Maceration: No Temperature: No Abnormality Tenderness on Palpation: Yes Treatment Notes Wound #6 (Ankle) Wound Laterality: Right, Lateral Cleanser Soap and Water Discharge Instruction: May shower and wash wound with dial antibacterial soap and water  prior to dressing change. Peri-Wound Care Zinc Oxide Ointment 30g tube Discharge Instruction: Apply Zinc Oxide to periwound with each dressing change Sween Lotion (Moisturizing lotion) Discharge Instruction: Apply moisturizing lotion as directed Topical Primary Dressing APLIGRAF Secondary Dressing Woven Gauze Sponge, Non-Sterile 4x4 in Discharge Instruction: Apply over primary dressing as directed. Secured With Transpore Surgical Tape, 2x10 (in/yd) Discharge Instruction: Secure dressing with tape as directed. Compression Wrap ThreePress (3 layer compression wrap) [name], [name] ([id number]) [phone].pdf Page 8 of 8 Discharge Instruction: Apply three layer compression as directed. Compression Stockings Add-Ons Electronic Signature(s) Signed: 12/29/2022 5:05:53 PM By: Blanche East RN Entered By: Blanche East on 12/29/2022 12:48:34 -------------------------------------------------------------------------------- Vitals Details Patient Name: Date of Service: [name], [name] 12/29/2022 12:30 PM Medical Record Number: [id number] Patient Account Number: [id number] Date of Birth/Sex: Treating RN: Dec 17, 1945 (77 y.o. F) Zochol, [name] Primary Care Euphemia Lingerfelt: Milus Mallick, MA RITZA Other Clinician: Referring Syretta Kochel: Treating Jahniah Pallas/Extender: Terese Door, MA RITZA Weeks in Treatment: 12 Vital Signs Time Taken: 12:40 Temperature (F): 97.9 Height (in): 65 Pulse (bpm): 66 Weight (lbs): 229 Respiratory Rate (breaths/min): 18 Body Mass Index (BMI): 38.1 Blood Pressure (mmHg): 150/83 Reference Range: 80 - 120 mg / dl Electronic Signature(s) Signed: 12/29/2022  5:05:53 PM By: Blanche East RN Entered By: Blanche East on 12/29/2022 12:45:26

## 2022-12-29 NOTE — Progress Notes (Addendum)
[name], [name] ([id number]) [id number] Page 1 of 11 Visit Report for 12/29/2022 Chief Complaint Document Details Patient Name: Date of Service: [name], [name] 12/29/2022 12:30 PM Medical Record Number: [id number] Patient Account Number: [id number] Date of Birth/Sex: Treating RN: [birth year] (77 y.o. F) Primary Care Provider: Milus Mallick, MA RITZA Other Clinician: Referring Provider: Treating Provider/Extender: Terese Door, MA RITZA Weeks in Treatment: 12 Information Obtained from: Patient Chief Complaint Bilateral LE Ulcers 10/05/2022: RLE ulcer Electronic Signature(s) Signed: 12/29/2022 1:14:28 PM By: Fredirick Maudlin MD FACS Entered By: Fredirick Maudlin on 12/29/2022 13:14:28 -------------------------------------------------------------------------------- Cellular or Tissue Based Product Details Patient Name: Date of Service: [name] 12/29/2022 12:30 PM Medical Record Number: [id number] Patient Account Number: [id number] Date of Birth/Sex: Treating RN: 02/09/1946 (77 y.o. F) [name] Primary Care Provider: Milus Mallick, MA RITZA Other Clinician: Referring Provider: Treating Provider/Extender: Terese Door, MA RITZA Weeks in Treatment: 12 Cellular or Tissue Based Product Type Wound #6 Right,Lateral Ankle Applied to: Performed By: Physician Fredirick Maudlin, MD Cellular or Tissue Based Product Type: Apligraf Level of Consciousness (Pre-procedure): Awake and Alert Pre-procedure Verification/Time Out Yes - 13:08 Taken: Location: trunk / arms / legs Wound Size (sq cm): 0.35 Product Size (sq cm): 39 Waste Size (sq cm): 34 Waste Reason: size of wound Amount of Product Applied (sq cm): 5 Instrument Used: Blade, Forceps, Scissors Lot #: [id number] Order #: 3 Expiration Date: 01/03/2023 Fenestrated: Yes Instrument: Blade Reconstituted: Yes Solution Type: normal saline Solution Amount: 10 Lot #[id number] Solution Expiration Date: 04/26/2025 Secured: Yes Secured With: Steri-Strips Dressing Applied: Yes Primary Dressing: adaptec Procedural Pain: 0 Post Procedural Pain: 0 Response to Treatment: Procedure was tolerated well Level of Consciousness (Post- Awake and Alert procedure): Post Procedure Diagnosis Same as Pre-procedure [name], [name] ([id number]) [id number] Page 2 of 11 Notes Scribed for Dr. Celine [name] by [name] East, RN Electronic Signature(s) Signed: 12/29/2022 2:02:28 PM By: Fredirick Maudlin MD FACS Signed: 12/29/2022 5:05:53 PM By: [name] East RN Entered By: [name] on 12/29/2022 13:12:13 -------------------------------------------------------------------------------- Debridement Details Patient Name: Date of Service: [name], [name] 12/29/2022 12:30 PM Medical Record Number: [id number] Patient Account Number: [id number] Date of Birth/Sex: Treating RN: 1946-02-04 (77 y.o. F) [name] Primary Care Provider: Milus Mallick, MA RITZA Other Clinician: Referring Provider: Treating Provider/Extender: Terese Door, MA RITZA Weeks in Treatment: 12 Debridement Performed for Assessment: Wound #6 Right,Lateral Ankle Performed By: Physician Fredirick Maudlin, MD Debridement Type: Debridement Severity of Tissue Pre Debridement: Fat layer exposed Level of Consciousness (Pre-procedure): Awake and Alert Pre-procedure Verification/Time Out Yes - 13:05 Taken: Start Time: 13:06 Pain Control: Lidocaine 5% topical ointment T Area Debrided (L x W): otal 0.7 (cm) x 0.5 (cm) = 0.35 (cm) Tissue and other material debrided: Non-Viable, Slough, Slough Level: Non-Viable Tissue Debridement Description: Selective/Open Wound Instrument: Curette Bleeding: Minimum Hemostasis Achieved: Pressure Response to Treatment: Procedure was tolerated well Level of Consciousness (Post- Awake and Alert procedure): Post Debridement Measurements of  Total Wound Length: (cm) 0.7 Width: (cm) 0.5 Depth: (cm) 0.1 Volume: (cm) 0.027 Character of Wound/Ulcer Post Debridement: Requires Further Debridement Severity of Tissue Post Debridement: Fat layer exposed Post Procedure Diagnosis Same as Pre-procedure Notes Scribed for Dr. Celine [name] by [name] East, RN Electronic Signature(s) Signed: 12/29/2022 2:02:28 PM By: Fredirick Maudlin MD FACS Signed: 12/29/2022 5:05:53 PM By: [name] East RN Entered By: [name] on 12/29/2022 13:07:52 -------------------------------------------------------------------------------- HPI Details Patient Name: Date of Service: [name], [name]  [name]. 12/29/2022 12:30 PM Medical Record Number: [id number] Patient Account Number: [id number] Date of Birth/Sex: Treating RN: 05-Sep-1946 (77 y.o. F) Primary Care Provider: Milus Mallick, MA RITZA Other Clinician: Referring Provider: Treating Provider/Extender: Terese Door, MA RITZA Weeks in Treatment: 12 History of Present Illness HPI Description: 07/09/18 on evaluation today patient presents for bilateral lower extremity ulcers. She has a past medical history significant for chronic BAIN, [name] ([id number]) [id number] Page 3 of 11 venous stasis, hypertension, and lower extremity edema with associated skin changes. Unfortunately she tells me that roughly 3 weeks ago she noted ulcers on the left lower extremity which started given her trouble. Subsequently she ended up proceeding with applying mupirocin ointment and was keeping this open air for the most part. She states that it would scab over somewhat and then reopened. The wounds have been training quite significantly. Fortunately there does not appear to be any evidence of overt infection there is some erythematous type skin changes but I believe this is likely stasis dermatitis nothing more. Fortunately she has no evidence of any systemic infection. No fevers, chills, nausea, or  vomiting noted at this time. She has been recommended before to wear compression stockings but she does not wear them on a regular basis. She has never been suggested to get Juxta-Lite compression wraps she was not aware of what these were. Patient had venous studies performed on 11/09/17 which showed no lower surety DVT or deep vein insufficiency noted. She did not have evaluation however of the superficial veins. There was full compressibility noted throughout. Her arterial study which was performed on 11/18/17 actually showed that she has an ABI on the right of 1.17 with a TBI of 1.59 along with an ABI of 1.12 on the left and a TBI of 0.99. 07/17/18 on evaluation today patient appears to be doing much better in regard to her left lower Trinity ulcers. She's been tolerating the dressing changes without complication. There does not appear to be any evidence of infection which is good news. Overall I'm very pleased with the progress at this time. 07/24/18 on evaluation today patient's wound actually appears to be completely healed at this time. Fortunately there does not appear to be any evidence of infection which is good news. Overall I'm very happy with the progress that has been made. She did get her Velcro compression wraps. READMISSION 12/31/2020 This is a patient we had in this clinic in 2019 felt to have chronic venous insufficiency wounds on her lower extremities. She was discharged with bilateral compression stockings. She tells [place] that she never really wore them or at best wore them sparingly. She is back in clinic today having a ulcer on her left lateral lower extremity since October and one on the right anterior for the last 2 weeks. I do not know that she is doing any primary dressing on these certainly not wearing any form of compression. She is already been evaluated by vein and vascular on 11/15/2020. They noted that she did have ulcers on the left leg in October that healed with a need  Unna boot. She had a reflux study on 12/20 that did not show evidence of a DVT or venous reflux bilaterally. They was not felt that she had arterial insufficiency based on the fact that she had brisk biphasic Doppler signals in the bilateral DP PT. The patient comes in today with superficial wounds on the right anterior mid tibia and the left lateral calf. These are  small appear to have healthy surfaces. Past medical history includes cervical and upper thoracic spine surgery on 7/21, hernia repair early in January, hypertension, hyperlipidemia, hypothyroidism, gastric bypass surgery, DVT in the right peroneal artery and left popliteal and peroneal veins. Next problem CHF. She does not have diabetes ABIs in our clinic were 1.1 on the right and 1.2 on the lower 2/11; the patient's wound on the left lateral and right anterior lower leg are closed for edema control is good. She has an assortment of old juxta lite stockings with old liners. She does not like to wear anything in particular under the compression part of the juxta lite stocking. I think she is going to do exactly what she wants. READMISSION 10/05/2022 This is a 77 year old woman last seen in our clinic about 18 months ago. She has a history of venous stasis and stasis dermatitis. A reflux study done in 2021 did not show any evidence of venous reflux. She says about 3 weeks ago, she noticed an ulcer on her right lateral malleolus. She was applying triple antibiotic ointment to it for a while and then was soaking it in Epsom salts. She reports that she usually wears compression stockings, but that they were rubbing so she did not wear 1 on her right leg since the wound appeared, she has a stocking with the toe and heel cut out applied to her left leg. On examination, she has changes of chronic stasis dermatitis on the right. There is a shallow ulcer overlying the lateral malleolus. The fat layer is exposed. It is a little bit desiccated but  fairly clean with just some light eschar and slough present. No malodor or purulent drainage. 10/13/2022: The wound is a little smaller today. Although the moisture balance is better than last week, it is still a bit dry. There is still some slough accumulation on the surface. 10/25/2022: The wound continues to contract. There is still thick layer of slough on the surface. The moisture balance of the wound is better, but the periwound skin is slightly macerated. 11/02/2022: The wound is smaller and shallower today. There is still slough on the surface, but the periwound skin is intact. The wound surface is still drier than ideal. 12/15; wound is about the same size minimal depth. Under illumination some debris on the surface she has been using Prisma 11/16/2022: The leg wrap became saturated when her makeshift cast protector leaked. The wound is a little bit larger today, as result of moisture-related tissue breakdown. The surface remains fairly fibrotic. 11/30/2022: The wound measured a little bit larger today secondary to additional moisture related periwound breakdown. There is slough on the wound surface. Still fairly fibrotic underneath. 12/05/2022: The ankle wound is a little smaller but still has slough accumulation. She has been approved for Apligraf and we are going to apply that today. 12/15/2022: The wound is smaller again today. Minimal slough. Edema control is good. 12/29/2022: The wound continues to contract. The surface is a little bit less fibrotic today. Light slough accumulation. Electronic Signature(s) Signed: 12/29/2022 1:14:58 PM By: Fredirick Maudlin MD FACS Entered By: Fredirick Maudlin on 12/29/2022 13:14:58 -------------------------------------------------------------------------------- Physical Exam Details Patient Name: Date of Service: [name], [place] 12/29/2022 12:30 PM Medical Record Number: [id number] Patient Account Number: [id number] Date of Birth/Sex: Treating  RN: 11-27-1946 (77 y.o. F) Primary Care Provider: Milus Mallick, MA RITZA Other Clinician: Referring Provider: Treating Provider/Extender: Terese Door, MA RITZA Weeks in Treatment: [address] [place] ([id number])  [id number] Page 4 of 11 Constitutional Hypertensive, asymptomatic. . . . no acute distress. Respiratory Normal work of breathing on room air. Notes 12/29/2022: The wound continues to contract. The surface is a little bit less fibrotic today. Light slough accumulation. Electronic Signature(s) Signed: 12/29/2022 1:15:29 PM By: Fredirick Maudlin MD FACS Entered By: Fredirick Maudlin on 12/29/2022 13:15:29 -------------------------------------------------------------------------------- Physician Orders Details Patient Name: Date of Service: [name], [place] 12/29/2022 12:30 PM Medical Record Number: [id number] Patient Account Number: [id number] Date of Birth/Sex: Treating RN: 1946-04-07 (77 y.o. F) [name] Primary Care Provider: Milus Mallick, MA RITZA Other Clinician: Referring Provider: Treating Provider/Extender: Terese Door, MA RITZA Weeks in Treatment: 12 Verbal / Phone Orders: No Diagnosis Coding ICD-10 Coding Code Description [id number] Non-pressure chronic ulcer of right ankle with fat layer exposed I87.2 Venous insufficiency (chronic) (peripheral) 99991111 Chronic diastolic (congestive) heart failure E66.09 Other obesity due to excess calories Follow-up Appointments ppointment in 2 weeks. - Dr. Celine [name] RM 1 Return A Friday 2/16 @ 12:30 pm Nurse Visit: - Friday 01/05/23 @ 11:15 pm RM 1 post apligraf (outers change) Anesthetic (In clinic) Topical Lidocaine 4% applied to wound bed Cellular or Tissue Based Products Wound #6 Right,Lateral Ankle Cellular or Tissue Based Product Type: - APLIGRAF #3 Cellular or Tissue Based Product applied to wound bed, secured with steri-strips, cover with Adaptic or Mepitel. (DO NOT  REMOVE). Bathing/ Shower/ Hygiene May shower with protection but do not get wound dressing(s) wet. Protect dressing(s) with water repellant cover (for example, large plastic bag) or a cast cover and may then take shower. - may purchase cast protector at [facility], [facility] or [employer] Edema Control - Lymphedema / SCD / Other Bilateral Lower Extremities Elevate legs to the level of the heart or above for 30 minutes daily and/or when sitting for 3-4 times a day throughout the day. Avoid standing for long periods of time. Patient to wear own compression stockings every day. Moisturize legs daily. Wound Treatment Wound #6 - Ankle Wound Laterality: Right, Lateral Cleanser: Soap and Water 1 x Per Week/30 Days Discharge Instructions: May shower and wash wound with dial antibacterial soap and water prior to dressing change. Peri-Wound Care: Zinc Oxide Ointment 30g tube 1 x Per Week/30 Days Discharge Instructions: Apply Zinc Oxide to periwound with each dressing change Peri-Wound Care: Sween Lotion (Moisturizing lotion) 1 x Per Week/30 Days Discharge Instructions: Apply moisturizing lotion as directed Prim Dressing: APLIGRAF ary 1 x Per Week/30 Days Secondary Dressing: Woven Gauze Sponge, Non-Sterile 4x4 in 1 x Per Week/30 Days Discharge Instructions: Apply over primary dressing as directed. [name], [name] ([id number]) [id number] Page 5 of 11 Secured With: Transpore Surgical Tape, 2x10 (in/yd) 1 x Per Week/30 Days Discharge Instructions: Secure dressing with tape as directed. Compression Wrap: ThreePress (3 layer compression wrap) 1 x Per Week/30 Days Discharge Instructions: Apply three layer compression as directed. Electronic Signature(s) Signed: 12/29/2022 2:02:28 PM By: Fredirick Maudlin MD FACS Signed: 12/29/2022 5:05:53 PM By: [name] East RN Entered By: [name] on 12/29/2022  13:21:23 -------------------------------------------------------------------------------- Problem List Details Patient Name: Date of Service: [name], [place] 12/29/2022 12:30 PM Medical Record Number: [id number] Patient Account Number: [id number] Date of Birth/Sex: Treating RN: 05-04-1946 (77 y.o. F) Primary Care Provider: Milus Mallick, MA RITZA Other Clinician: Referring Provider: Treating Provider/Extender: Terese Door, MA RITZA Weeks in Treatment: 12 Active Problems ICD-10 Encounter Code Description Active Date MDM Diagnosis L97.312 Non-pressure chronic ulcer of right ankle with fat layer exposed  10/05/2022 No Yes I87.2 Venous insufficiency (chronic) (peripheral) 10/05/2022 No Yes 99991111 Chronic diastolic (congestive) heart failure 10/05/2022 No Yes E66.09 Other obesity due to excess calories 10/05/2022 No Yes Inactive Problems Resolved Problems Electronic Signature(s) Signed: 12/29/2022 1:13:26 PM By: Fredirick Maudlin MD FACS Entered By: Fredirick Maudlin on 12/29/2022 13:13:26 -------------------------------------------------------------------------------- Progress Note Details Patient Name: Date of Service: [name], [place]. 12/29/2022 12:30 PM Medical Record Number: [id number] Patient Account Number: [id number] Date of Birth/Sex: Treating RN: 18-Jan-1946 (77 y.o. F) Primary Care Provider: Milus Mallick, MA RITZA Other Clinician: Referring Provider: Treating Provider/Extender: Terese Door, MA RITZA Weeks in Treatment: 12 Subjective Chief Complaint Information obtained from Patient Bilateral LE Ulcers 10/05/2022: RLE ulcer Verrastro, [name] ([id number]) [id number].pdf Page 6 of 11 History of Present Illness (HPI) 07/09/18 on evaluation today patient presents for bilateral lower extremity ulcers. She has a past medical history significant for chronic venous stasis, hypertension, and lower extremity edema with associated skin  changes. Unfortunately she tells me that roughly 3 weeks ago she noted ulcers on the left lower extremity which started given her trouble. Subsequently she ended up proceeding with applying mupirocin ointment and was keeping this open air for the most part. She states that it would scab over somewhat and then reopened. The wounds have been training quite significantly. Fortunately there does not appear to be any evidence of overt infection there is some erythematous type skin changes but I believe this is likely stasis dermatitis nothing more. Fortunately she has no evidence of any systemic infection. No fevers, chills, nausea, or vomiting noted at this time. She has been recommended before to wear compression stockings but she does not wear them on a regular basis. She has never been suggested to get Juxta-Lite compression wraps she was not aware of what these were. Patient had venous studies performed on 11/09/17 which showed no lower surety DVT or deep vein insufficiency noted. She did not have evaluation however of the superficial veins. There was full compressibility noted throughout. Her arterial study which was performed on 11/18/17 actually showed that she has an ABI on the right of 1.17 with a TBI of 1.59 along with an ABI of 1.12 on the left and a TBI of 0.99. 07/17/18 on evaluation today patient appears to be doing much better in regard to her left lower Trinity ulcers. She's been tolerating the dressing changes without complication. There does not appear to be any evidence of infection which is good news. Overall I'm very pleased with the progress at this time. 07/24/18 on evaluation today patient's wound actually appears to be completely healed at this time. Fortunately there does not appear to be any evidence of infection which is good news. Overall I'm very happy with the progress that has been made. She did get her Velcro compression wraps. READMISSION 12/31/2020 This is a patient we had  in this clinic in 2019 felt to have chronic venous insufficiency wounds on her lower extremities. She was discharged with bilateral compression stockings. She tells [place] that she never really wore them or at best wore them sparingly. She is back in clinic today having a ulcer on her left lateral lower extremity since October and one on the right anterior for the last 2 weeks. I do not know that she is doing any primary dressing on these certainly not wearing any form of compression. She is already been evaluated by vein and vascular on 11/15/2020. They noted that she did have ulcers on the  left leg in October that healed with a need Unna boot. She had a reflux study on 12/20 that did not show evidence of a DVT or venous reflux bilaterally. They was not felt that she had arterial insufficiency based on the fact that she had brisk biphasic Doppler signals in the bilateral DP PT. The patient comes in today with superficial wounds on the right anterior mid tibia and the left lateral calf. These are small appear to have healthy surfaces. Past medical history includes cervical and upper thoracic spine surgery on 7/21, hernia repair early in January, hypertension, hyperlipidemia, hypothyroidism, gastric bypass surgery, DVT in the right peroneal artery and left popliteal and peroneal veins. Next problem CHF. She does not have diabetes ABIs in our clinic were 1.1 on the right and 1.2 on the lower 2/11; the patient's wound on the left lateral and right anterior lower leg are closed for edema control is good. She has an assortment of old juxta lite stockings with old liners. She does not like to wear anything in particular under the compression part of the juxta lite stocking. I think she is going to do exactly what she wants. READMISSION 10/05/2022 This is a 77 year old woman last seen in our clinic about 18 months ago. She has a history of venous stasis and stasis dermatitis. A reflux study done in 2021 did not  show any evidence of venous reflux. She says about 3 weeks ago, she noticed an ulcer on her right lateral malleolus. She was applying triple antibiotic ointment to it for a while and then was soaking it in Epsom salts. She reports that she usually wears compression stockings, but that they were rubbing so she did not wear 1 on her right leg since the wound appeared, she has a stocking with the toe and heel cut out applied to her left leg. On examination, she has changes of chronic stasis dermatitis on the right. There is a shallow ulcer overlying the lateral malleolus. The fat layer is exposed. It is a little bit desiccated but fairly clean with just some light eschar and slough present. No malodor or purulent drainage. 10/13/2022: The wound is a little smaller today. Although the moisture balance is better than last week, it is still a bit dry. There is still some slough accumulation on the surface. 10/25/2022: The wound continues to contract. There is still thick layer of slough on the surface. The moisture balance of the wound is better, but the periwound skin is slightly macerated. 11/02/2022: The wound is smaller and shallower today. There is still slough on the surface, but the periwound skin is intact. The wound surface is still drier than ideal. 12/15; wound is about the same size minimal depth. Under illumination some debris on the surface she has been using Prisma 11/16/2022: The leg wrap became saturated when her makeshift cast protector leaked. The wound is a little bit larger today, as result of moisture-related tissue breakdown. The surface remains fairly fibrotic. 11/30/2022: The wound measured a little bit larger today secondary to additional moisture related periwound breakdown. There is slough on the wound surface. Still fairly fibrotic underneath. 12/05/2022: The ankle wound is a little smaller but still has slough accumulation. She has been approved for Apligraf and we are going to  apply that today. 12/15/2022: The wound is smaller again today. Minimal slough. Edema control is good. 12/29/2022: The wound continues to contract. The surface is a little bit less fibrotic today. Light slough accumulation. Patient History Information obtained  from Patient. Family History Cancer - Mother,Father, Diabetes - Father, Heart Disease - Father, Hypertension - Father, Stroke - Mother, No family history of Hereditary Spherocytosis, Kidney Disease, Lung Disease, Seizures, Thyroid Problems, Tuberculosis. Social History Never smoker, Marital Status - Married, Alcohol Use - Daily - 1.5 bottles of wine daily, Drug Use - No History, Caffeine Use - Daily - coffee. Medical History Eyes Patient has history of Cataracts - mild Denies history of Glaucoma, Optic Neuritis Ear/Nose/Mouth/Throat [name], [name] ([id number]) [id number] Page 7 of 11 Denies history of Chronic sinus problems/congestion, Middle ear problems Hematologic/Lymphatic Patient has history of Anemia - iron deficiency, Lymphedema Denies history of Hemophilia, Human Immunodeficiency Virus, Sickle Cell Disease Respiratory Denies history of Aspiration, Asthma, Chronic Obstructive Pulmonary Disease (COPD), Pneumothorax, Sleep Apnea, Tuberculosis Cardiovascular Patient has history of Congestive Heart Failure, Hypertension, Peripheral Venous Disease Denies history of Angina, Arrhythmia, Coronary Artery Disease, Deep Vein Thrombosis, Hypotension, Myocardial Infarction, Peripheral Arterial Disease, Phlebitis, Vasculitis Gastrointestinal Denies history of Cirrhosis , Colitis, Crohnoos, Hepatitis A, Hepatitis B, Hepatitis C Endocrine Denies history of Type I Diabetes, Type II Diabetes Genitourinary Denies history of End Stage Renal Disease Immunological Denies history of Lupus Erythematosus, Raynaudoos, Scleroderma Integumentary (Skin) Denies history of History of Burn Musculoskeletal Patient has  history of Osteoarthritis Denies history of Gout, Rheumatoid Arthritis, Osteomyelitis Neurologic Patient has history of Neuropathy - bila Denies history of Dementia, Quadriplegia, Paraplegia, Seizure Disorder Oncologic Denies history of Received Chemotherapy, Received Radiation Psychiatric Patient has history of Confinement Anxiety Denies history of Anorexia/bulimia Hospitalization/Surgery History - right arm surgery. - gastric bypass. - hysterectomy. - tonsillectomy. - hernia repair. - cervical myelopathy with spinal cord compression. Medical A Surgical History Notes nd Constitutional Symptoms (General Health) obesity Ear/Nose/Mouth/Throat seasonal allergies Cardiovascular aortic murmur, hyperlipidemia, hypertriglyceridemia Gastrointestinal UGI bleed, Endocrine hypothyroidism Objective Constitutional Hypertensive, asymptomatic. no acute distress. Vitals Time Taken: 12:40 PM, Height: 65 in, Weight: 229 lbs, BMI: 38.1, Temperature: 97.9 F, Pulse: 66 bpm, Respiratory Rate: 18 breaths/min, Blood Pressure: 150/83 mmHg. Respiratory Normal work of breathing on room air. General Notes: 12/29/2022: The wound continues to contract. The surface is a little bit less fibrotic today. Light slough accumulation. Integumentary (Hair, Skin) Wound #6 status is Open. Original cause of wound was Gradually Appeared. The date acquired was: 09/25/2022. The wound has been in treatment 12 weeks. The wound is located on the Right,Lateral Ankle. The wound measures 0.7cm length x 0.5cm width x 0.1cm depth; 0.275cm^2 area and 0.027cm^3 volume. There is Fat Layer (Subcutaneous Tissue) exposed. There is no tunneling or undermining noted. There is a medium amount of serous drainage noted. The wound margin is flat and intact. There is medium (34-66%) pink granulation within the wound bed. There is a medium (34-66%) amount of necrotic tissue within the wound bed including Adherent Slough. The periwound skin  appearance had no abnormalities noted for texture. The periwound skin appearance exhibited: Hemosiderin Staining. The periwound skin appearance did not exhibit: Dry/Scaly, Maceration, Erythema. Periwound temperature was noted as No Abnormality. The periwound has tenderness on palpation. Assessment Active Problems ICD-10 Non-pressure chronic ulcer of right ankle with fat layer exposed Venous insufficiency (chronic) (peripheral) [name], [name] ([id number].pdf Page 8 of 11 Chronic diastolic (congestive) heart failure Other obesity due to excess calories Procedures Wound #6 Pre-procedure diagnosis of Wound #6 is a Venous Leg Ulcer located on the Right,Lateral Ankle .Severity of Tissue Pre Debridement is: Fat layer exposed. There was a Selective/Open Wound Non-Viable Tissue Debridement with a total area of 0.35 sq cm  performed by Fredirick Maudlin, MD. With the following instrument(s): Curette to remove Non-Viable tissue/material. Material removed includes [name] after achieving pain control using Lidocaine 5% topical ointment. No specimens were taken. A time out was conducted at 13:05, prior to the start of the procedure. A Minimum amount of bleeding was controlled with Pressure. The procedure was tolerated well. Post Debridement Measurements: 0.7cm length x 0.5cm width x 0.1cm depth; 0.027cm^3 volume. Character of Wound/Ulcer Post Debridement requires further debridement. Severity of Tissue Post Debridement is: Fat layer exposed. Post procedure Diagnosis Wound #6: Same as Pre-Procedure General Notes: Scribed for Dr. Celine [name] by [name] East, RN. Pre-procedure diagnosis of Wound #6 is a Venous Leg Ulcer located on the Right,Lateral Ankle. A skin graft procedure using a bioengineered skin substitute/cellular or tissue based product was performed by Fredirick Maudlin, MD with the following instrument(s): Blade, Forceps, and Scissors. Apligraf was applied and  secured with Steri-Strips. 5 sq cm of product was utilized and 34 sq cm was wasted due to size of wound. Post Application, adaptec was applied. A Time Out was conducted at 13:08, prior to the start of the procedure. The procedure was tolerated well with a pain level of 0 throughout and a pain level of 0 following the procedure. Post procedure Diagnosis Wound #6: Same as Pre-Procedure General Notes: Scribed for Dr. Celine [name] by [name] East, RN. Pre-procedure diagnosis of Wound #6 is a Venous Leg Ulcer located on the Right,Lateral Ankle . There was a Double Layer Compression Therapy Procedure by [name] East, RN. Post procedure Diagnosis Wound #6: Same as Pre-Procedure Notes: Scribed for Dr. Celine [name] by [name] East, RN. Plan Follow-up Appointments: Return Appointment in 2 weeks. - Dr. Celine [name] RM 1 Friday 2/16 @ 12:30 pm Nurse Visit: - Friday 01/05/23 @ 11:15 pm RM 1 post apligraf (outers change) Anesthetic: (In clinic) Topical Lidocaine 4% applied to wound bed Cellular or Tissue Based Products: Wound #6 Right,Lateral Ankle: Cellular or Tissue Based Product Type: - APLIGRAF #3 Cellular or Tissue Based Product applied to wound bed, secured with steri-strips, cover with Adaptic or Mepitel. (DO NOT REMOVE). Bathing/ Shower/ Hygiene: May shower with protection but do not get wound dressing(s) wet. Protect dressing(s) with water repellant cover (for example, large plastic bag) or a cast cover and may then take shower. - may purchase cast protector at [facility], [facility] or [employer] Edema Control - Lymphedema / SCD / Other: Elevate legs to the level of the heart or above for 30 minutes daily and/or when sitting for 3-4 times a day throughout the day. Avoid standing for long periods of time. Patient to wear own compression stockings every day. Moisturize legs daily. WOUND #6: - Ankle Wound Laterality: Right, Lateral Cleanser: Soap and Water 1 x Per Week/30 Days Discharge Instructions: May shower and wash  wound with dial antibacterial soap and water prior to dressing change. Peri-Wound Care: Zinc Oxide Ointment 30g tube 1 x Per Week/30 Days Discharge Instructions: Apply Zinc Oxide to periwound with each dressing change Peri-Wound Care: Sween Lotion (Moisturizing lotion) 1 x Per Week/30 Days Discharge Instructions: Apply moisturizing lotion as directed Prim Dressing: APLIGRAF 1 x Per Week/30 Days ary Secondary Dressing: Woven Gauze Sponge, Non-Sterile 4x4 in 1 x Per Week/30 Days Discharge Instructions: Apply over primary dressing as directed. Secured With: Transpore Surgical T ape, 2x10 (in/yd) 1 x Per Week/30 Days Discharge Instructions: Secure dressing with tape as directed. Com pression Wrap: ThreePress (3 layer compression wrap) 1 x Per Week/30 Days Discharge Instructions: Apply three layer compression as  directed. 12/29/2022: The wound continues to contract. The surface is a little bit less fibrotic today. Light slough accumulation. I used a curette to debride slough from the wound. I then prepared Apligraf #3 and fenestrated it with a scalpel. It was applied to the wound and trimmed to fit appropriately. It was secured in place with Adaptic and Steri-Strips. A gauze sponge was used as a bolster. We discussed other ways that she could improve her healing right, including walking and other light exercise as well as elevating her legs when she is resting. We also discussed the importance of adequate protein intake and strategized foods and supplements that could help her meet a goal of around 70 g of protein per day. She will have a nurse visit next week and then I will see her in 2 weeks for her next Apligraf placement. Electronic Signature(s) Signed: 01/14/2023 2:19:36 PM By: Deon Pilling RN, BSN Signed: 01/22/2023 5:14:33 PM By: Fredirick Maudlin MD FACS Previous Signature: 12/29/2022 1:17:33 PM Version By: Fredirick Maudlin MD FACS [name], [name] ([id number])  [id number] Page 9 of 11 Previous Signature: 12/29/2022 1:17:33 PM Version By: Fredirick Maudlin MD FACS Entered By: Deon Pilling on 01/14/2023 13:57:48 -------------------------------------------------------------------------------- HxROS Details Patient Name: Date of Service: [facility], [place] 12/29/2022 12:30 PM Medical Record Number: [id number] Patient Account Number: [id number] Date of Birth/Sex: Treating RN: 09-06-1946 (77 y.o. F) Primary Care Provider: Milus Mallick, MA RITZA Other Clinician: Referring Provider: Treating Provider/Extender: Terese Door, MA RITZA Weeks in Treatment: 12 Information Obtained From Patient Constitutional Symptoms (General Health) Medical History: Past Medical History Notes: obesity Eyes Medical History: Positive for: Cataracts - mild Negative for: Glaucoma; Optic Neuritis Ear/Nose/Mouth/Throat Medical History: Negative for: Chronic sinus problems/congestion; Middle ear problems Past Medical History Notes: seasonal allergies Hematologic/Lymphatic Medical History: Positive for: Anemia - iron deficiency; Lymphedema Negative for: Hemophilia; Human Immunodeficiency Virus; Sickle Cell Disease Respiratory Medical History: Negative for: Aspiration; Asthma; Chronic Obstructive Pulmonary Disease (COPD); Pneumothorax; Sleep Apnea; Tuberculosis Cardiovascular Medical History: Positive for: Congestive Heart Failure; Hypertension; Peripheral Venous Disease Negative for: Angina; Arrhythmia; Coronary Artery Disease; Deep Vein Thrombosis; Hypotension; Myocardial Infarction; Peripheral Arterial Disease; Phlebitis; Vasculitis Past Medical History Notes: aortic murmur, hyperlipidemia, hypertriglyceridemia Gastrointestinal Medical History: Negative for: Cirrhosis ; Colitis; Crohns; Hepatitis A; Hepatitis B; Hepatitis C Past Medical History Notes: UGI bleed, Endocrine Medical History: Negative for: Type I Diabetes;  Type II Diabetes Past Medical History Notes: hypothyroidism Genitourinary Medical History: Negative for: End Stage Renal Disease Immunological Medical History: Negative for: Lupus Erythematosus; Raynauds; Scleroderma [name], [name] ([id number]) [id number] Page 10 of 11 Integumentary (Skin) Medical History: Negative for: History of Burn Musculoskeletal Medical History: Positive for: Osteoarthritis Negative for: Gout; Rheumatoid Arthritis; Osteomyelitis Neurologic Medical History: Positive for: Neuropathy - bila Negative for: Dementia; Quadriplegia; Paraplegia; Seizure Disorder Oncologic Medical History: Negative for: Received Chemotherapy; Received Radiation Psychiatric Medical History: Positive for: Confinement Anxiety Negative for: Anorexia/bulimia HBO Extended History Items Eyes: Cataracts Immunizations Pneumococcal Vaccine: Received Pneumococcal Vaccination: No Implantable Devices No devices added Hospitalization / Surgery History Type of Hospitalization/Surgery right arm surgery gastric bypass hysterectomy tonsillectomy hernia repair cervical myelopathy with spinal cord compression Family and Social History Cancer: Yes - Mother,Father; Diabetes: Yes - Father; Heart Disease: Yes - Father; Hereditary Spherocytosis: No; Hypertension: Yes - Father; Kidney Disease: No; Lung Disease: No; Seizures: No; Stroke: Yes - Mother; Thyroid Problems: No; Tuberculosis: No; Never smoker; Marital Status - Married; Alcohol Use: Daily - 1.5 bottles of wine daily; Drug Use: No History;  Caffeine Use: Daily - coffee; Financial Concerns: No; Food, Clothing or Shelter Needs: No; Support System Lacking: No; Transportation Concerns: No Electronic Signature(s) Signed: 12/29/2022 2:02:28 PM By: Fredirick Maudlin MD FACS Entered By: Fredirick Maudlin on 12/29/2022 13:15:06 -------------------------------------------------------------------------------- SuperBill  Details Patient Name: Date of Service: [name], [place]. 12/29/2022 Medical Record Number: [id number] Patient Account Number: [id number] Date of Birth/Sex: Treating RN: 26-Apr-1946 (77 y.o. F) Primary Care Provider: Milus Mallick, MA RITZA Other Clinician: Referring Provider: Treating Provider/Extender: Terese Door, MA RITZA Weeks in Treatment: 12 Diagnosis Coding [name], [name] ([id number]) [phone].pdf Page 11 of 11 ICD-10 Codes Code Description [id number] Non-pressure chronic ulcer of right ankle with fat layer exposed I87.2 Venous insufficiency (chronic) (peripheral) 99991111 Chronic diastolic (congestive) heart failure E66.09 Other obesity due to excess calories Facility Procedures : CPT4 Code: [id number] Description: (Facility Use Only) Apligraf 1 SQ CM Modifier: Quantity: 39 : CPT4 Code: [id number] Description: [id number] - SKIN SUB GRAFT TRNK/ARM/LEG ICD-10 Diagnosis Description [id number] Non-pressure chronic ulcer of right ankle with fat layer exposed Modifier: Quantity: 1 Physician Procedures : CPT4 Code Description Modifier [id number] - WC PHYS LEVEL 3 - EST PT 25 ICD-10 Diagnosis Description [id number] Non-pressure chronic ulcer of right ankle with fat layer exposed I87.2 Venous insufficiency (chronic) (peripheral) 99991111 Chronic diastolic  (congestive) heart failure E66.09 Other obesity due to excess calories Quantity: 1 : [id number] - WC PHYS SKIN SUB GRAFT TRNK/ARM/LEG ICD-10 Diagnosis Description L97.312 Non-pressure chronic ulcer of right ankle with fat layer exposed Quantity: 1 Electronic Signature(s) Signed: 12/29/2022 1:17:56 PM By: Fredirick Maudlin MD FACS Entered By: Fredirick Maudlin on 12/29/2022 13:17:56

## 2023-01-05 ENCOUNTER — Encounter (HOSPITAL_BASED_OUTPATIENT_CLINIC_OR_DEPARTMENT_OTHER): Payer: PPO | Admitting: General Surgery

## 2023-01-05 DIAGNOSIS — L97312 Non-pressure chronic ulcer of right ankle with fat layer exposed: Secondary | ICD-10-CM | POA: Diagnosis not present

## 2023-01-05 NOTE — Progress Notes (Signed)
[name], HIRN ([id number]) [id number] Page 1 of 4 Visit Report for 01/05/2023 Arrival Information Details Patient Name: Date of Service: [name], [name] 01/05/2023 11:15 A M Medical Record Number: [id number] Patient Account Number: [id number] Date of Birth/Sex: Treating RN: 1945-12-02 (77 y.o. F) Primary Care [name]: [name] Mallick, MA [name] Other Clinician: Referring [name]: Treating [name]: [name] Door, MA [name] in Treatment: 13 Visit Information History Since Last Visit All ordered tests and consults were completed: No Patient Arrived: Cane Added or deleted any medications: No Arrival Time: 11:19 Any new allergies or adverse reactions: No Accompanied By: husband Had a fall or experienced change in No Transfer Assistance: None activities of daily living that may affect Patient Identification Verified: Yes risk of falls: Secondary Verification Process Completed: Yes Signs or symptoms of abuse/neglect since last visito No Patient Requires Transmission-Based Precautions: No Hospitalized since last visit: No Patient Has Alerts: No Implantable device outside of the clinic excluding No cellular tissue based products placed in the center since last visit: Pain Present Now: No Electronic Signature(s) Signed: 01/05/2023 11:27:31 AM By: [name] Entered By: [name] on 01/05/2023 11:20:00 -------------------------------------------------------------------------------- Compression Therapy Details Patient Name: Date of Service: [name], [name] 01/05/2023 11:15 A M Medical Record Number: [id number] Patient Account Number: [id number] Date of Birth/Sex: Treating RN: 03-06-1946 (77 y.o. [name] Primary Care Veatrice Eckstein: [name] Mallick, MA [name] Other Clinician: Referring [name]: Treating [name]: [name] Door, MA [name] in Treatment: 13 Compression Therapy Performed for Wound  Assessment: Wound #6 Right,Lateral Ankle Performed By: Clinician [name] Catholic, RN Compression Type: Three Layer Electronic Signature(s) Signed: 01/05/2023 5:51:49 PM By: [name] Catholic RN Entered By: [name] on 01/05/2023 17:32:08 -------------------------------------------------------------------------------- Encounter Discharge Information Details Patient Name: Date of Service: [name], [name]. 01/05/2023 11:15 A M Medical Record Number: [id number] Patient Account Number: [id number] Date of Birth/Sex: Treating RN: 1946/10/21 (77 y.o. [name] [name], [place] [name] ([id number]) [id number] Page 2 of 4 Primary Care [name]: [name] Mallick, MA [name] Other Clinician: Referring [name]: Treating [name]: [name] Door, MA [name] in Treatment: 13 Encounter Discharge Information Items Discharge Condition: Stable Ambulatory Status: Cane Discharge Destination: Home Transportation: Private Auto Accompanied By: spouse Schedule Follow-up Appointment: Yes Clinical Summary of Care: Patient Declined Electronic Signature(s) Signed: 01/05/2023 5:51:49 PM By: [name] Catholic RN Entered By: [name] on 01/05/2023 17:32:56 -------------------------------------------------------------------------------- Patient/Caregiver Education Details Patient Name: Date of Service: [name], [name] 2/9/2024andnbsp11:15 [place] Record Number: [id number] Patient Account Number: [id number] Date of Birth/Gender: Treating RN: 1946/09/03 (77 y.o. [name] Primary Care Physician: [name] Mallick, MA [name] Other Clinician: Referring Physician: Treating Physician/Extender: [name] Door, MA [name] in Treatment: 13 Education Assessment Education Provided To: Patient Education Topics Provided Wound/Skin Impairment: Methods: Explain/Verbal Responses: Return demonstration correctly Electronic Signature(s) Signed:  01/05/2023 5:51:49 PM By: [name] Catholic RN Entered By: [name] on 01/05/2023 17:32:39 -------------------------------------------------------------------------------- Wound Assessment Details Patient Name: Date of Service: [name], [name]. 01/05/2023 11:15 A M Medical Record Number: [id number] Patient Account Number: [id number] Date of Birth/Sex: Treating RN: Feb 09, 1946 (77 y.o. [name] Primary Care [name]: [name] Mallick, MA [name] Other Clinician: Referring [name]: Treating [name]: [name] Door, MA [name] in Treatment: 13 Wound Status Wound Number: 6 Primary Venous Leg Ulcer Etiology: Wound Location: Right, Lateral Ankle Wound Open Wounding Event: Gradually Appeared Status: Date Acquired: 09/25/2022 Comorbid Cataracts, Anemia, Lymphedema,  Congestive Heart Failure, [name] Of Treatment: [address] [name] ([id number]) [id number] Page 3 of 4 [name] Of Treatment: 13 History: Hypertension, Peripheral Venous Disease, Osteoarthritis, Clustered Wound: No Neuropathy, Confinement Anxiety Wound Measurements Length: (cm) 0.7 Width: (cm) 0.5 Depth: (cm) 0.1 Area: (cm) 0.275 Volume: (cm) 0.027 % Reduction in Area: 73.5% % Reduction in Volume: 74% Epithelialization: Small (1-33%) Tunneling: No Undermining: No Wound Description Classification: Full Thickness Without Exposed Support Structures Wound Margin: Flat and Intact Exudate Amount: Medium Exudate Type: Serous Exudate Color: amber Foul Odor After Cleansing: No Slough/Fibrino Yes Wound Bed Granulation Amount: Medium (34-66%) Exposed Structure Granulation Quality: Pink Fascia Exposed: No Necrotic Amount: Medium (34-66%) Fat Layer (Subcutaneous Tissue) Exposed: Yes Necrotic Quality: Adherent Slough Tendon Exposed: No Muscle Exposed: No Joint Exposed: No Bone Exposed: No Periwound Skin Texture Texture Color No Abnormalities Noted: Yes No  Abnormalities Noted: No Erythema: No Moisture Hemosiderin Staining: Yes No Abnormalities Noted: No Dry / Scaly: No Temperature / Pain Maceration: No Temperature: No Abnormality Tenderness on Palpation: Yes Treatment Notes Wound #6 (Ankle) Wound Laterality: Right, Lateral Cleanser Soap and Water Discharge Instruction: May shower and wash wound with dial antibacterial soap and water prior to dressing change. Peri-Wound Care Zinc Oxide Ointment 30g tube Discharge Instruction: Apply Zinc Oxide to periwound with each dressing change Sween Lotion (Moisturizing lotion) Discharge Instruction: Apply moisturizing lotion as directed Topical Primary Dressing APLIGRAF Secondary Dressing Woven Gauze Sponge, Non-Sterile 4x4 in Discharge Instruction: Apply over primary dressing as directed. Secured With Transpore Surgical Tape, 2x10 (in/yd) Discharge Instruction: Secure dressing with tape as directed. Compression Wrap ThreePress (3 layer compression wrap) Discharge Instruction: Apply three layer compression as directed. Compression Stockings Add-Ons Electronic Signature(s) Signed: 01/05/2023 5:51:49 PM By: [name] Catholic RN Entered By: [name] on 01/05/2023 17:31:10 [name] ([id number] Page 4 of 4 -------------------------------------------------------------------------------- Vitals Details Patient Name: Date of Service: [name], [name] 01/05/2023 11:15 A M Medical Record Number: [id number] Patient Account Number: [id number] Date of Birth/Sex: Treating RN: January 16, 1946 (77 y.o. F) Primary Care Scottie Stanish: [name] Mallick, MA [name] Other Clinician: Referring Greyden Besecker: Treating Diesha Rostad/Extender: [name] Door, MA [name] in Treatment: 13 Vital Signs Time Taken: 11:20 Temperature (F): 97.6 Height (in): 65 Pulse (bpm): 79 Weight (lbs): 229 Respiratory Rate (breaths/min): 20 Body Mass Index (BMI): 38.1 Blood  Pressure (mmHg): 172/92 Reference Range: 80 - 120 mg / dl Electronic Signature(s) Signed: 01/05/2023 11:27:31 AM By: [name] Entered By: [name] on 01/05/2023 11:20:22

## 2023-01-08 NOTE — Progress Notes (Signed)
ABRIONA, SINNER ([id number]) [id number] Page 1 of 1 Visit Report for 01/05/2023 SuperBill Details Patient Name: Date of Service: JENI, TURCIOS 01/05/2023 Medical Record Number: [id number] Patient Account Number: [id number] Date of Birth/Sex: Treating RN: October 27, 1946 (77 y.o. America Brown Primary Care Provider: Milus Mallick, MA RITZA Other Clinician: Referring Provider: Treating Provider/Extender: Terese Door, MA RITZA Weeks in Treatment: 13 Diagnosis Coding ICD-10 Codes Code Description [id number] Non-pressure chronic ulcer of right ankle with fat layer exposed I87.2 Venous insufficiency (chronic) (peripheral) 99991111 Chronic diastolic (congestive) heart failure E66.09 Other obesity due to excess calories Facility Procedures CPT4 Code Description Modifier Quantity [id number] (Facility Use Only) [phone] - APPLY MULTLAY COMPRS LWR RT LEG 1 Electronic Signature(s) Signed: 01/05/2023 5:51:49 PM By: Dellie Catholic RN Signed: 01/08/2023 8:41:39 AM By: Fredirick Maudlin MD FACS Entered By: Dellie Catholic on 01/05/2023 17:33:46

## 2023-01-12 ENCOUNTER — Encounter (HOSPITAL_BASED_OUTPATIENT_CLINIC_OR_DEPARTMENT_OTHER): Payer: PPO | Admitting: General Surgery

## 2023-01-12 DIAGNOSIS — I872 Venous insufficiency (chronic) (peripheral): Secondary | ICD-10-CM | POA: Diagnosis not present

## 2023-01-12 DIAGNOSIS — L97312 Non-pressure chronic ulcer of right ankle with fat layer exposed: Secondary | ICD-10-CM | POA: Diagnosis not present

## 2023-01-13 NOTE — Progress Notes (Signed)
MELVENA, SPORLEDER ([id number]) [id number] Page 1 of 7 Visit Report for 01/12/2023 Arrival Information Details Patient Name: Date of Service: [name], [name] 01/12/2023 12:30 PM Medical Record Number: [id number] Patient Account Number: [id number] Date of Birth/Sex: Treating RN: 11-May-1946 (77 y.o. F) Zochol, [name] Primary Care Nikolaus Pienta: Milus Mallick, MA [place] Other Clinician: Referring [name] Novak: Treating Forrester Blando/Extender: Terese Door, MA R[place] Weeks in Treatment: 14 Visit Information History Since Last Visit Added or deleted any medications: No Patient Arrived: Cane Any new allergies or adverse reactions: No Arrival Time: 12:40 Had a fall or experienced change in No Accompanied By: husband activities of daily living that may affect Transfer Assistance: None risk of falls: Patient Identification Verified: Yes Signs or symptoms of abuse/neglect since last visito No Secondary Verification Process Completed: Yes Hospitalized since last visit: No Patient Requires Transmission-Based Precautions: No Implantable device outside of the clinic excluding No Patient Has Alerts: No cellular tissue based products placed in the center since last visit: Has Compression in Place as Prescribed: Yes Pain Present Now: Yes Electronic Signature(s) Signed: 01/12/2023 4:26:35 PM By: Blanche East RN Entered By: Blanche East on 01/12/2023 12:40:53 -------------------------------------------------------------------------------- Compression Therapy Details Patient Name: Date of Service: [name], [name] Balo H. 01/12/2023 12:30 PM Medical Record Number: [id number] Patient Account Number: [id number] Date of Birth/Sex: Treating RN: 06-23-1946 (77 y.o. F) Zochol, [name] Primary Care London Tarnowski: Milus Mallick, MA [place] Other Clinician: Referring Toni Demo: Treating Plummer Matich/Extender: Terese Door, MA [place] Weeks in Treatment: 14 Compression Therapy Performed for  Wound Assessment: Wound #6 Right,Lateral Ankle Performed By: Clinician Blanche East, RN Compression Type: Double Layer Post Procedure Diagnosis Same as Pre-procedure Notes URGO lite Electronic Signature(s) Signed: 01/12/2023 4:26:35 PM By: Blanche East RN Entered By: Blanche East on 01/12/2023 13:06:10 [name] ([id number] Page 2 of 7 -------------------------------------------------------------------------------- Lower Extremity Assessment Details Patient Name: Date of Service: [name], [name] 01/12/2023 12:30 PM Medical Record Number: [id number] Patient Account Number: [id number] Date of Birth/Sex: Treating RN: 05/03/1946 (77 y.o. F) Zochol, [name] Primary Care Farrel Guimond: Milus Mallick, MA [place] Other Clinician: Referring Wendie Diskin: Treating Evangelyne Loja/Extender: Terese Door, MA [place] Weeks in Treatment: 14 Edema Assessment Assessed: [Left: No] [Right: No] Edema: [Left: Ye] [Right: s] Calf Left: Right: Point of Measurement: From Medial Instep 33 cm Ankle Left: Right: Point of Measurement: From Medial Instep 21 cm Vascular Assessment Pulses: Dorsalis Pedis Palpable: [Right:Yes] Electronic Signature(s) Signed: 01/12/2023 4:26:35 PM By: Blanche East RN Entered By: Blanche East on 01/12/2023 12:41:42 -------------------------------------------------------------------------------- Multi Wound Chart Details Patient Name: Date of Service: [name], [name] Balo H. 01/12/2023 12:30 PM Medical Record Number: [id number] Patient Account Number: [id number] Date of Birth/Sex: Treating RN: 06-11-1946 (77 y.o. F) Primary Care Ethelmae Ringel: Milus Mallick, MA [place] Other Clinician: Referring Sheron Tallman: Treating Destenee Guerry/Extender: Terese Door, MA [place] Weeks in Treatment: 14 Vital Signs Height(in): 65 Pulse(bpm): 70 Weight(lbs): 229 Blood Pressure(mmHg): 160/80 Body Mass Index(BMI): 38.1 Temperature(F): Respiratory  Rate(breaths/min): 18 [6:Photos:] [N/A:N/A] Right, Lateral Ankle N/A N/A Wound Location: Gradually Appeared N/A N/A Wounding Event: Venous Leg Ulcer N/A N/A Primary Etiology: Cataracts, Anemia, Lymphedema, N/A N/A Comorbid History: Congestive Heart Failure, Hypertension, Peripheral Venous Disease, Osteoarthritis, Neuropathy, Confinement Anxiety 09/25/2022 N/A N/A Date Acquired: 14 N/A N/A Weeks of Treatment: Open N/A N/A Wound Status: No N/A N/A Wound Recurrence: 0.6x0.5x0.2 N/A N/A Measurements L x W x D (cm) 0.236 N/A N/A A (cm) : rea 0.047 N/A N/A Volume (cm) : 77.20% N/A  N/A % Reduction in A rea: 54.80% N/A N/A % Reduction in Volume: Full Thickness Without Exposed N/A N/A Classification: Support Structures Medium N/A N/A Exudate A mount: Serous N/A N/A Exudate Type: amber N/A N/A Exudate Color: Flat and Intact N/A N/A Wound Margin: Medium (34-66%) N/A N/A Granulation A mount: Pink N/A N/A Granulation Quality: Medium (34-66%) N/A N/A Necrotic A mount: Fat Layer (Subcutaneous Tissue): Yes N/A N/A Exposed Structures: Fascia: No Tendon: No Muscle: No Joint: No Bone: No Small (1-33%) N/A N/A Epithelialization: Debridement - Selective/Open Wound N/A N/A Debridement: Pre-procedure Verification/Time Out 12:57 N/A N/A Taken: Lidocaine 4% Topical Solution N/A N/A Pain Control: Slough N/A N/A Tissue Debrided: Non-Viable Tissue N/A N/A Level: 0.3 N/A N/A Debridement A (sq cm): rea Curette N/A N/A Instrument: Minimum N/A N/A Bleeding: Pressure N/A N/A Hemostasis A chieved: Procedure was tolerated well N/A N/A Debridement Treatment Response: 0.6x0.5x0.2 N/A N/A Post Debridement Measurements L x W x D (cm) 0.047 N/A N/A Post Debridement Volume: (cm) No Abnormalities Noted N/A N/A Periwound Skin Texture: Maceration: No N/A N/A Periwound Skin Moisture: Dry/Scaly: No Hemosiderin Staining: Yes N/A N/A Periwound Skin Color: Erythema:  No No Abnormality N/A N/A Temperature: Yes N/A N/A Tenderness on Palpation: Cellular or Tissue Based Product N/A N/A Procedures Performed: Compression Therapy Debridement Treatment Notes Electronic Signature(s) Signed: 01/12/2023 1:06:37 PM By: Fredirick Maudlin MD FACS Entered By: Fredirick Maudlin on 01/12/2023 13:06:37 -------------------------------------------------------------------------------- Multi-Disciplinary Care Plan Details Patient Name: Date of Service: [name], [name] 01/12/2023 12:30 PM Medical Record Number: [id number] Patient Account Number: [id number] Date of Birth/Sex: Treating RN: [birth year] (77 y.o. [name], [name] Primary Care Eulanda Dorion: Milus Mallick, MA RIT[place] Other Clinician: Referring Zaleah Ternes: Treating Abdoulie Tierce/Extender: Terese Door, MA RIT[place] Weeks in Treatment: [address], Thea H ([id number] Page 4 of 7 Active Inactive Nutrition Nursing Diagnoses: Potential for alteratiion in Nutrition/Potential for imbalanced nutrition Goals: Patient/caregiver agrees to and verbalizes understanding of need to use nutritional supplements and/or vitamins as prescribed Date Initiated: 10/13/2022 Target Resolution Date: 02/25/2023 Goal Status: Active Interventions: Provide education on nutrition Treatment Activities: Dietary management education, guidance and counseling : 10/13/2022 Education provided on Nutrition : 10/25/2022 Notes: Wound/Skin Impairment Nursing Diagnoses: Impaired tissue integrity Goals: Ulcer/skin breakdown will have a volume reduction of 30% by week 4 Date Initiated: 10/13/2022 Target Resolution Date: 02/25/2023 Goal Status: Active Interventions: Provide education on ulcer and skin care Notes: Electronic Signature(s) Signed: 01/12/2023 4:26:35 PM By: Blanche East RN Entered By: Blanche East on 01/12/2023  12:49:52 -------------------------------------------------------------------------------- Pain Assessment Details Patient Name: Date of Service: [name], [name] 01/12/2023 12:30 PM Medical Record Number: [id number] Patient Account Number: [id number] Date of Birth/Sex: Treating RN: October 06, 1946 (77 y.o. F) Zochol, [name] Primary Care Lilybeth Vien: Milus Mallick, MA RITZA Other Clinician: Referring Letti Towell: Treating Wanona Stare/Extender: Terese Door, MA RITZA Weeks in Treatment: 14 Active Problems Location of Pain Severity and Description of Pain Patient Has Paino No Site Locations Rate the pain. DIVINA, ABARE ([id number]) [id number] Page 5 of 7 Rate the pain. Current Pain Level: 0 Pain Management and Medication Current Pain Management: Electronic Signature(s) Signed: 01/12/2023 4:26:35 PM By: Blanche East RN Entered By: Blanche East on 01/12/2023 12:41:23 -------------------------------------------------------------------------------- Patient/Caregiver Education Details Patient Name: Date of Service: [name], [name] 2/16/2024andnbsp12:30 PM Medical Record Number: [id number] Patient Account Number: [id number] Date of Birth/Gender: Treating RN: June 07, 1946 (77 y.o. [name], [name] Primary Care Physician: Milus Mallick, MA [place] Other Clinician: Referring Physician: Treating Physician/Extender: Susa Raring  BO NZA, MA RITZA Weeks in Treatment: 14 Education Assessment Education Provided To: Patient Education Topics Provided Wound Debridement: Methods: Explain/Verbal Responses: Reinforcements needed, State content correctly Wound/Skin Impairment: Methods: Explain/Verbal Responses: Reinforcements needed, State content correctly Electronic Signature(s) Signed: 01/12/2023 4:26:35 PM By: Blanche East RN Entered By: Blanche East on 01/12/2023  12:50:13 -------------------------------------------------------------------------------- Wound Assessment Details Patient Name: Date of Service: [name], [name] 01/12/2023 12:30 PM [name] ([id number] Page 6 of 7 Medical Record Number: [id number] Patient Account Number: [id number] Date of Birth/Sex: Treating RN: 1946-02-11 (77 y.o. F) Zochol, [name] Primary Care Louiza Moor: Milus Mallick, MA RITZA Other Clinician: Referring Cristiana Yochim: Treating Chanel Mcadams/Extender: Terese Door, MA RITZA Weeks in Treatment: 14 Wound Status Wound Number: 6 Primary Venous Leg Ulcer Etiology: Wound Location: Right, Lateral Ankle Wound Open Wounding Event: Gradually Appeared Status: Date Acquired: 09/25/2022 Comorbid Cataracts, Anemia, Lymphedema, Congestive Heart Failure, Weeks Of Treatment: 14 History: Hypertension, Peripheral Venous Disease, Osteoarthritis, Clustered Wound: No Neuropathy, Confinement Anxiety Photos Wound Measurements Length: (cm) 0.6 Width: (cm) 0.5 Depth: (cm) 0.2 Area: (cm) 0.236 Volume: (cm) 0.047 % Reduction in Area: 77.2% % Reduction in Volume: 54.8% Epithelialization: Small (1-33%) Tunneling: No Undermining: No Wound Description Classification: Full Thickness Without Exposed Suppor Wound Margin: Flat and Intact Exudate Amount: Medium Exudate Type: Serous Exudate Color: amber t Structures Foul Odor After Cleansing: No Slough/Fibrino Yes Wound Bed Granulation Amount: Medium (34-66%) Exposed Structure Granulation Quality: Pink Fascia Exposed: No Necrotic Amount: Medium (34-66%) Fat Layer (Subcutaneous Tissue) Exposed: Yes Necrotic Quality: Adherent Slough Tendon Exposed: No Muscle Exposed: No Joint Exposed: No Bone Exposed: No Periwound Skin Texture Texture Color No Abnormalities Noted: Yes No Abnormalities Noted: No Erythema: No Moisture Hemosiderin Staining: Yes No Abnormalities Noted: No Dry  / Scaly: No Temperature / Pain Maceration: No Temperature: No Abnormality Tenderness on Palpation: Yes Electronic Signature(s) Signed: 01/12/2023 4:26:35 PM By: Blanche East RN Entered By: Blanche East on 01/12/2023 12:49:20 [name] ([id number] Page 7 of 7 -------------------------------------------------------------------------------- Vitals Details Patient Name: Date of Service: [name], [name] 01/12/2023 12:30 PM Medical Record Number: [id number] Patient Account Number: [id number] Date of Birth/Sex: Treating RN: [birth year] (77 y.o. F) Zochol, [name] Primary Care Coren Crownover: Milus Mallick, MA RITZA Other Clinician: Referring Fouad Taul: Treating [place] Gustafson/Extender: Terese Door, MA RITZA Weeks in Treatment: 14 Vital Signs Time Taken: 12:40 Pulse (bpm): 69 Height (in): 65 Respiratory Rate (breaths/min): 18 Weight (lbs): 229 Blood Pressure (mmHg): 160/80 Body Mass Index (BMI): 38.1 Reference Range: 80 - 120 mg / dl Electronic Signature(s) Signed: 01/12/2023 4:26:35 PM By: Blanche East RN Entered By: Blanche East on 01/12/2023 12:41:14

## 2023-01-13 NOTE — Progress Notes (Signed)
[name], [name] ([id number]) [id number] Page 1 of 12 Visit Report for 01/12/2023 Chief Complaint Document Details Patient Name: Date of Service: [name], [name] 01/12/2023 12:30 PM Medical Record Number: [id number] Patient Account Number: [id number] Date of Birth/Sex: Treating RN: 1946-02-06 (77 y.o. F) Primary Care Provider: Milus Mallick, MA RITZA Other Clinician: Referring Provider: Treating Provider/Extender: Terese Door, MA RITZA Weeks in Treatment: 14 Information Obtained from: Patient Chief Complaint Bilateral LE Ulcers 10/05/2022: RLE ulcer Electronic Signature(s) Signed: 01/12/2023 1:07:29 PM By: Fredirick Maudlin MD FACS Entered By: Fredirick Maudlin on 01/12/2023 13:07:29 -------------------------------------------------------------------------------- Cellular or Tissue Based Product Details Patient Name: Date of Service: [name], [name] Balo H. 01/12/2023 12:30 PM Medical Record Number: [id number] Patient Account Number: [id number] Date of Birth/Sex: Treating RN: [birth year] (77 y.o. F) Zochol, [name] Primary Care Provider: Milus Mallick, MA RITZA Other Clinician: Referring Provider: Treating Provider/Extender: Terese Door, MA RITZA Weeks in Treatment: 14 Cellular or Tissue Based Product Type Wound #6 Right,Lateral Ankle Applied to: Performed By: Physician Fredirick Maudlin, MD Cellular or Tissue Based Product Type: Apligraf Level of Consciousness (Pre-procedure): Awake and Alert Pre-procedure Verification/Time Out Yes - 13:00 Taken: Location: trunk / arms / legs Wound Size (sq cm): 0.3 Product Size (sq cm): 39 Waste Size (sq cm): 35 Waste Reason: size of wound Amount of Product Applied (sq cm): 4 Instrument Used: Blade, Curette Lot #: [id number] Order #: [id number] Expiration Date: 01/16/2023 Fenestrated: Yes Instrument: Blade Reconstituted: Yes Solution Type: normal saline Solution Amount: 10 Lot #:  [id number] Solution Expiration Date: 04/26/2025 Secured: Yes Secured With: Steri-Strips Dressing Applied: Yes Primary Dressing: Gasper Lloyd ([id number] Page 2 of 12 Response to Treatment: Procedure was tolerated well Level of Consciousness (Post- Awake and Alert procedure): Post Procedure Diagnosis Same as Pre-procedure Notes Scribed for Dr. Celine Ahr by Blanche East, RN Electronic Signature(s) Signed: 01/12/2023 1:17:24 PM By: Fredirick Maudlin MD FACS Signed: 01/12/2023 4:26:35 PM By: Blanche East RN Entered By: Blanche East on 01/12/2023 13:05:50 -------------------------------------------------------------------------------- Debridement Details Patient Name: Date of Service: [name], [name] 01/12/2023 12:30 PM Medical Record Number: [id number] Patient Account Number: [id number] Date of Birth/Sex: Treating RN: 08/12/1946 (77 y.o. F) Zochol, [name] Primary Care Provider: Milus Mallick, MA RITZA Other Clinician: Referring Provider: Treating Provider/Extender: Terese Door, MA RITZA Weeks in Treatment: 14 Debridement Performed for Assessment: Wound #6 Right,Lateral Ankle Performed By: Physician Fredirick Maudlin, MD Debridement Type: Debridement Severity of Tissue Pre Debridement: Fat layer exposed Level of Consciousness (Pre-procedure): Awake and Alert Pre-procedure Verification/Time Out Yes - 12:57 Taken: Start Time: 12:58 Pain Control: Lidocaine 4% T opical Solution T Area Debrided (L x W): otal 0.6 (cm) x 0.5 (cm) = 0.3 (cm) Tissue and other material debrided: Non-Viable, Slough, Slough Level: Non-Viable Tissue Debridement Description: Selective/Open Wound Instrument: Curette Bleeding: Minimum Hemostasis Achieved: Pressure Response to Treatment: Procedure was tolerated well Level of Consciousness (Post- Awake and Alert procedure): Post Debridement Measurements of Total Wound Length: (cm) 0.6 Width: (cm)  0.5 Depth: (cm) 0.2 Volume: (cm) 0.047 Character of Wound/Ulcer Post Debridement: Requires Further Debridement Severity of Tissue Post Debridement: Fat layer exposed Post Procedure Diagnosis Same as Pre-procedure Notes Scribed for Dr. Celine Ahr by Blanche East, RN Electronic Signature(s) Signed: 01/12/2023 1:17:24 PM By: Fredirick Maudlin MD FACS Signed: 01/12/2023 4:26:35 PM By: Blanche East RN Entered By: Blanche East on 01/12/2023 12:59:48 [name] ([id number] Page 3 of 12 -------------------------------------------------------------------------------- HPI Details Patient Name: Date of Service: [name], [name] 01/12/2023 12:30 PM Medical Record Number: [id number] Patient Account Number: [id number] Date of Birth/Sex: Treating RN: 10-11-1946 (77 y.o. F) Primary Care Provider: Milus Mallick, MA RITZA Other Clinician: Referring Provider: Treating Provider/Extender: Terese Door, MA RITZA Weeks in Treatment: 14 History of Present Illness HPI Description: 07/09/18 on evaluation today patient presents for bilateral lower extremity ulcers. She has a past medical history significant for chronic venous stasis, hypertension, and lower extremity edema with associated skin changes. Unfortunately she tells me that roughly 3 weeks ago she noted ulcers on the left lower extremity which started given her trouble. Subsequently she ended up proceeding with applying mupirocin ointment and was keeping this open air for the most part. She states that it would scab over somewhat and then reopened. The wounds have been training quite significantly. Fortunately there does not appear to be any evidence of overt infection there is some erythematous type skin changes but I believe this is likely stasis dermatitis nothing more. Fortunately she has no evidence of any systemic infection. No fevers, chills, nausea, or vomiting noted at this time. She has  been recommended before to wear compression stockings but she does not wear them on a regular basis. She has never been suggested to get Juxta-Lite compression wraps she was not aware of what these were. Patient had venous studies performed on 11/09/17 which showed no lower surety DVT or deep vein insufficiency noted. She did not have evaluation however of the superficial veins. There was full compressibility noted throughout. Her arterial study which was performed on 11/18/17 actually showed that she has an ABI on the right of 1.17 with a TBI of 1.59 along with an ABI of 1.12 on the left and a TBI of 0.99. 07/17/18 on evaluation today patient appears to be doing much better in regard to her left lower Trinity ulcers. She's been tolerating the dressing changes without complication. There does not appear to be any evidence of infection which is good news. Overall I'm very pleased with the progress at this time. 07/24/18 on evaluation today patient's wound actually appears to be completely healed at this time. Fortunately there does not appear to be any evidence of infection which is good news. Overall I'm very happy with the progress that has been made. She did get her Velcro compression wraps. READMISSION 12/31/2020 This is a patient we had in this clinic in 2019 felt to have chronic venous insufficiency wounds on her lower extremities. She was discharged with bilateral compression stockings. She tells [place] that she never really wore them or at best wore them sparingly. She is back in clinic today having a ulcer on her left lateral lower extremity since October and one on the right anterior for the last 2 weeks. I do not know that she is doing any primary dressing on these certainly not wearing any form of compression. She is already been evaluated by vein and vascular on 11/15/2020. They noted that she did have ulcers on the left leg in October that healed with a need Unna boot. She had a reflux study on  12/20 that did not show evidence of a DVT or venous reflux bilaterally. They was not felt that she had arterial insufficiency based on the fact that she had brisk biphasic Doppler signals in the bilateral DP PT. The patient comes in today with superficial wounds on the right anterior mid tibia and the left lateral calf. These are small appear to have healthy surfaces. Past  medical history includes cervical and upper thoracic spine surgery on 7/21, hernia repair early in January, hypertension, hyperlipidemia, hypothyroidism, gastric bypass surgery, DVT in the right peroneal artery and left popliteal and peroneal veins. Next problem CHF. She does not have diabetes ABIs in our clinic were 1.1 on the right and 1.2 on the lower 2/11; the patient's wound on the left lateral and right anterior lower leg are closed for edema control is good. She has an assortment of old juxta lite stockings with old liners. She does not like to wear anything in particular under the compression part of the juxta lite stocking. I think she is going to do exactly what she wants. READMISSION 10/05/2022 This is a 77 year old woman last seen in our clinic about 18 months ago. She has a history of venous stasis and stasis dermatitis. A reflux study done in 2021 did not show any evidence of venous reflux. She says about 3 weeks ago, she noticed an ulcer on her right lateral malleolus. She was applying triple antibiotic ointment to it for a while and then was soaking it in Epsom salts. She reports that she usually wears compression stockings, but that they were rubbing so she did not wear 1 on her right leg since the wound appeared, she has a stocking with the toe and heel cut out applied to her left leg. On examination, she has changes of chronic stasis dermatitis on the right. There is a shallow ulcer overlying the lateral malleolus. The fat layer is exposed. It is a little bit desiccated but fairly clean with just some light eschar  and slough present. No malodor or purulent drainage. 10/13/2022: The wound is a little smaller today. Although the moisture balance is better than last week, it is still a bit dry. There is still some slough accumulation on the surface. 10/25/2022: The wound continues to contract. There is still thick layer of slough on the surface. The moisture balance of the wound is better, but the periwound skin is slightly macerated. 11/02/2022: The wound is smaller and shallower today. There is still slough on the surface, but the periwound skin is intact. The wound surface is still drier than ideal. 12/15; wound is about the same size minimal depth. Under illumination some debris on the surface she has been using Prisma 11/16/2022: The leg wrap became saturated when her makeshift cast protector leaked. The wound is a little bit larger today, as result of moisture-related tissue breakdown. The surface remains fairly fibrotic. 11/30/2022: The wound measured a little bit larger today secondary to additional moisture related periwound breakdown. There is slough on the wound surface. Still fairly fibrotic underneath. 12/05/2022: The ankle wound is a little smaller but still has slough accumulation. She has been approved for Apligraf and we are going to apply that today. 12/15/2022: The wound is smaller again today. Minimal slough. Edema control is good. [name], [name] ([id number]) [id number] Page 4 of 12 12/29/2022: The wound continues to contract. The surface is a little bit less fibrotic today. Light slough accumulation. 01/12/2023: The wound is smaller and has a better fill of granulation tissue. There is just a little slough on the surface and a small remaining area in the center that is still fibrotic. Electronic Signature(s) Signed: 01/12/2023 1:08:03 PM By: Fredirick Maudlin MD FACS Entered By: Fredirick Maudlin on 01/12/2023  13:08:03 -------------------------------------------------------------------------------- Physical Exam Details Patient Name: Date of Service: [name], [name] Balo H. 01/12/2023 12:30 PM Medical Record Number: [id number] Patient Account Number: [id number] Date of  Birth/Sex: Treating RN: Jan 24, 1946 (77 y.o. F) Primary Care Provider: Milus Mallick, MA RITZA Other Clinician: Referring Provider: Treating Provider/Extender: Terese Door, MA RITZA Weeks in Treatment: 14 Constitutional Hypertensive, asymptomatic. . . . no acute distress. Respiratory Normal work of breathing on room air. Notes 01/12/2023: The wound is smaller and has a better fill of granulation tissue. There is just a little slough on the surface and a small remaining area in the center that is still fibrotic. Electronic Signature(s) Signed: 01/12/2023 1:08:32 PM By: Fredirick Maudlin MD FACS Entered By: Fredirick Maudlin on 01/12/2023 13:08:31 -------------------------------------------------------------------------------- Physician Orders Details Patient Name: Date of Service: [name], [name] Balo H. 01/12/2023 12:30 PM Medical Record Number: [id number] Patient Account Number: [id number] Date of Birth/Sex: Treating RN: 11/03/1946 (77 y.o. F) Zochol, [name] Primary Care Provider: Milus Mallick, MA RITZA Other Clinician: Referring Provider: Treating Provider/Extender: Terese Door, MA RITZA Weeks in Treatment: 14 Verbal / Phone Orders: No Diagnosis Coding ICD-10 Coding Code Description [id number] Non-pressure chronic ulcer of right ankle with fat layer exposed I87.2 Venous insufficiency (chronic) (peripheral) 99991111 Chronic diastolic (congestive) heart failure E66.09 Other obesity due to excess calories Follow-up Appointments ppointment in 2 weeks. - Dr. Celine Ahr RM 1 Return A Nurse Visit: - Friday 01/05/23 @ 11:15 pm RM 1 post apligraf (outers change) Anesthetic [place], [name] ([id number] Page 5 of 12 (In clinic) Topical Lidocaine 4% applied to wound bed Cellular or Tissue Based Products Wound #6 Right,Lateral Ankle Cellular or Tissue Based Product Type: - APLIGRAF #4 Cellular or Tissue Based Product applied to wound bed, secured with steri-strips, cover with Adaptic or Mepitel. (DO NOT REMOVE). Bathing/ Shower/ Hygiene May shower with protection but do not get wound dressing(s) wet. Protect dressing(s) with water repellant cover (for example, large plastic bag) or a cast cover and may then take shower. - may purchase cast protector at [facility], [facility] or [employer] Edema Control - Lymphedema / SCD / Other Bilateral Lower Extremities Elevate legs to the level of the heart or above for 30 minutes daily and/or when sitting for 3-4 times a day throughout the day. Avoid standing for long periods of time. Patient to wear own compression stockings every day. Moisturize legs daily. Wound Treatment Wound #6 - Ankle Wound Laterality: Right, Lateral Cleanser: Soap and Water 1 x Per Week/30 Days Discharge Instructions: May shower and wash wound with dial antibacterial soap and water prior to dressing change. Peri-Wound Care: Zinc Oxide Ointment 30g tube 1 x Per Week/30 Days Discharge Instructions: Apply Zinc Oxide to periwound with each dressing change Peri-Wound Care: Sween Lotion (Moisturizing lotion) 1 x Per Week/30 Days Discharge Instructions: Apply moisturizing lotion as directed Prim Dressing: APLIGRAF ary 1 x Per Week/30 Days Secondary Dressing: Woven Gauze Sponge, Non-Sterile 4x4 in 1 x Per Week/30 Days Discharge Instructions: Apply over primary dressing as directed. Secured With: Transpore Surgical Tape, 2x10 (in/yd) 1 x Per Week/30 Days Discharge Instructions: Secure dressing with tape as directed. Compression Wrap: ThreePress (3 layer compression wrap) 1 x Per Week/30 Days Discharge Instructions: Apply three layer compression as  directed. Electronic Signature(s) Signed: 01/12/2023 1:17:24 PM By: Fredirick Maudlin MD FACS Entered By: Fredirick Maudlin on 01/12/2023 13:09:09 -------------------------------------------------------------------------------- Problem List Details Patient Name: Date of Service: [name], [name] 01/12/2023 12:30 PM Medical Record Number: [id number] Patient Account Number: [id number] Date of Birth/Sex: Treating RN: Jun 04, 1946 (77 y.o. F) Primary Care Provider: Milus Mallick, MA RITZA Other Clinician: Referring Provider: Treating Provider/Extender: Fredirick Maudlin  A BO NZA, MA RITZA Weeks in Treatment: 14 Active Problems ICD-10 Encounter Code Description Active Date MDM Diagnosis L97.312 Non-pressure chronic ulcer of right ankle with fat layer exposed 10/05/2022 No Yes I87.2 Venous insufficiency (chronic) (peripheral) 10/05/2022 No Yes [place], Kyaira H ([id number] Page 6 of 12 [id number] Chronic diastolic (congestive) heart failure 10/05/2022 No Yes E66.09 Other obesity due to excess calories 10/05/2022 No Yes Inactive Problems Resolved Problems Electronic Signature(s) Signed: 01/12/2023 1:06:30 PM By: Fredirick Maudlin MD FACS Entered By: Fredirick Maudlin on 01/12/2023 13:06:30 -------------------------------------------------------------------------------- Progress Note Details Patient Name: Date of Service: [name], [name] 01/12/2023 12:30 PM Medical Record Number: [id number] Patient Account Number: [id number] Date of Birth/Sex: Treating RN: Oct 17, 1946 (77 y.o. F) Primary Care Provider: Milus Mallick, MA RITZA Other Clinician: Referring Provider: Treating Provider/Extender: Terese Door, MA RITZA Weeks in Treatment: 14 Subjective Chief Complaint Information obtained from Patient Bilateral LE Ulcers 10/05/2022: RLE ulcer History of Present Illness (HPI) 07/09/18 on evaluation today patient presents for bilateral lower extremity  ulcers. She has a past medical history significant for chronic venous stasis, hypertension, and lower extremity edema with associated skin changes. Unfortunately she tells me that roughly 3 weeks ago she noted ulcers on the left lower extremity which started given her trouble. Subsequently she ended up proceeding with applying mupirocin ointment and was keeping this open air for the most part. She states that it would scab over somewhat and then reopened. The wounds have been training quite significantly. Fortunately there does not appear to be any evidence of overt infection there is some erythematous type skin changes but I believe this is likely stasis dermatitis nothing more. Fortunately she has no evidence of any systemic infection. No fevers, chills, nausea, or vomiting noted at this time. She has been recommended before to wear compression stockings but she does not wear them on a regular basis. She has never been suggested to get Juxta-Lite compression wraps she was not aware of what these were. Patient had venous studies performed on 11/09/17 which showed no lower surety DVT or deep vein insufficiency noted. She did not have evaluation however of the superficial veins. There was full compressibility noted throughout. Her arterial study which was performed on 11/18/17 actually showed that she has an ABI on the right of 1.17 with a TBI of 1.59 along with an ABI of 1.12 on the left and a TBI of 0.99. 07/17/18 on evaluation today patient appears to be doing much better in regard to her left lower Trinity ulcers. She's been tolerating the dressing changes without complication. There does not appear to be any evidence of infection which is good news. Overall I'm very pleased with the progress at this time. 07/24/18 on evaluation today patient's wound actually appears to be completely healed at this time. Fortunately there does not appear to be any evidence of infection which is good news. Overall I'm  very happy with the progress that has been made. She did get her Velcro compression wraps. READMISSION 12/31/2020 This is a patient we had in this clinic in 2019 felt to have chronic venous insufficiency wounds on her lower extremities. She was discharged with bilateral compression stockings. She tells [place] that she never really wore them or at best wore them sparingly. She is back in clinic today having a ulcer on her left lateral lower extremity since October and one on the right anterior for the last 2 weeks. I do not know that she is doing any primary  dressing on these certainly not wearing any form of compression. She is already been evaluated by vein and vascular on 11/15/2020. They noted that she did have ulcers on the left leg in October that healed with a need Unna boot. She had a reflux study on 12/20 that did not show evidence of a DVT or venous reflux bilaterally. They was not felt that she had arterial insufficiency based on the fact that she had brisk biphasic Doppler signals in the bilateral DP PT. The patient comes in today with superficial wounds on the right anterior mid tibia and the left lateral calf. These are small appear to have healthy surfaces. Past medical history includes cervical and upper thoracic spine surgery on 7/21, hernia repair early in January, hypertension, hyperlipidemia, hypothyroidism, gastric bypass surgery, DVT in the right peroneal artery and left popliteal and peroneal veins. Next problem CHF. She does not have diabetes ABIs in our clinic were 1.1 on the right and 1.2 on the lower 2/11; the patient's wound on the left lateral and right anterior lower leg are closed for edema control is good. She has an assortment of old juxta lite stockings with old liners. She does not like to wear anything in particular under the compression part of the juxta lite stocking. I think she is going to do exactly what she wants. [name], [name] ([id number])  [id number] Page 7 of 12 10/05/2022 This is a 78 year old woman last seen in our clinic about 18 months ago. She has a history of venous stasis and stasis dermatitis. A reflux study done in 2021 did not show any evidence of venous reflux. She says about 3 weeks ago, she noticed an ulcer on her right lateral malleolus. She was applying triple antibiotic ointment to it for a while and then was soaking it in Epsom salts. She reports that she usually wears compression stockings, but that they were rubbing so she did not wear 1 on her right leg since the wound appeared, she has a stocking with the toe and heel cut out applied to her left leg. On examination, she has changes of chronic stasis dermatitis on the right. There is a shallow ulcer overlying the lateral malleolus. The fat layer is exposed. It is a little bit desiccated but fairly clean with just some light eschar and slough present. No malodor or purulent drainage. 10/13/2022: The wound is a little smaller today. Although the moisture balance is better than last week, it is still a bit dry. There is still some slough accumulation on the surface. 10/25/2022: The wound continues to contract. There is still thick layer of slough on the surface. The moisture balance of the wound is better, but the periwound skin is slightly macerated. 11/02/2022: The wound is smaller and shallower today. There is still slough on the surface, but the periwound skin is intact. The wound surface is still drier than ideal. 12/15; wound is about the same size minimal depth. Under illumination some debris on the surface she has been using Prisma 11/16/2022: The leg wrap became saturated when her makeshift cast protector leaked. The wound is a little bit larger today, as result of moisture-related tissue breakdown. The surface remains fairly fibrotic. 11/30/2022: The wound measured a little bit larger today secondary to additional moisture related  periwound breakdown. There is slough on the wound surface. Still fairly fibrotic underneath. 12/05/2022: The ankle wound is a little smaller but still has slough accumulation. She has been approved for Apligraf and we are going  to apply that today. 12/15/2022: The wound is smaller again today. Minimal slough. Edema control is good. 12/29/2022: The wound continues to contract. The surface is a little bit less fibrotic today. Light slough accumulation. 01/12/2023: The wound is smaller and has a better fill of granulation tissue. There is just a little slough on the surface and a small remaining area in the center that is still fibrotic. Patient History Information obtained from Patient. Family History Cancer - Mother,Father, Diabetes - Father, Heart Disease - Father, Hypertension - Father, Stroke - Mother, No family history of Hereditary Spherocytosis, Kidney Disease, Lung Disease, Seizures, Thyroid Problems, Tuberculosis. Social History Never smoker, Marital Status - Married, Alcohol Use - Daily - 1.5 bottles of wine daily, Drug Use - No History, Caffeine Use - Daily - coffee. Medical History Eyes Patient has history of Cataracts - mild Denies history of Glaucoma, Optic Neuritis Ear/Nose/Mouth/Throat Denies history of Chronic sinus problems/congestion, Middle ear problems Hematologic/Lymphatic Patient has history of Anemia - iron deficiency, Lymphedema Denies history of Hemophilia, Human Immunodeficiency Virus, Sickle Cell Disease Respiratory Denies history of Aspiration, Asthma, Chronic Obstructive Pulmonary Disease (COPD), Pneumothorax, Sleep Apnea, Tuberculosis Cardiovascular Patient has history of Congestive Heart Failure, Hypertension, Peripheral Venous Disease Denies history of Angina, Arrhythmia, Coronary Artery Disease, Deep Vein Thrombosis, Hypotension, Myocardial Infarction, Peripheral Arterial Disease, Phlebitis, Vasculitis Gastrointestinal Denies history of Cirrhosis , Colitis,  Crohnoos, Hepatitis A, Hepatitis B, Hepatitis C Endocrine Denies history of Type I Diabetes, Type II Diabetes Genitourinary Denies history of End Stage Renal Disease Immunological Denies history of Lupus Erythematosus, Raynaudoos, Scleroderma Integumentary (Skin) Denies history of History of Burn Musculoskeletal Patient has history of Osteoarthritis Denies history of Gout, Rheumatoid Arthritis, Osteomyelitis Neurologic Patient has history of Neuropathy - bila Denies history of Dementia, Quadriplegia, Paraplegia, Seizure Disorder Oncologic Denies history of Received Chemotherapy, Received Radiation Psychiatric Patient has history of Confinement Anxiety Denies history of Anorexia/bulimia Hospitalization/Surgery History - right arm surgery. - gastric bypass. - hysterectomy. - tonsillectomy. - hernia repair. - cervical myelopathy with spinal cord compression. Medical A Surgical History Notes nd Constitutional Symptoms (General Health) obesity Ear/Nose/Mouth/Throat seasonal allergies Cardiovascular [name], [name] ([id number]) [id number] Page 8 of 12 aortic murmur, hyperlipidemia, hypertriglyceridemia Gastrointestinal UGI bleed, Endocrine hypothyroidism Objective Constitutional Hypertensive, asymptomatic. no acute distress. Vitals Time Taken: 12:40 PM, Height: 65 in, Weight: 229 lbs, BMI: 38.1, Pulse: 69 bpm, Respiratory Rate: 18 breaths/min, Blood Pressure: 160/80 mmHg. Respiratory Normal work of breathing on room air. General Notes: 01/12/2023: The wound is smaller and has a better fill of granulation tissue. There is just a little slough on the surface and a small remaining area in the center that is still fibrotic. Integumentary (Hair, Skin) Wound #6 status is Open. Original cause of wound was Gradually Appeared. The date acquired was: 09/25/2022. The wound has been in treatment 14 weeks. The wound is located on the Right,Lateral Ankle. The  wound measures 0.6cm length x 0.5cm width x 0.2cm depth; 0.236cm^2 area and 0.047cm^3 volume. There is Fat Layer (Subcutaneous Tissue) exposed. There is no tunneling or undermining noted. There is a medium amount of serous drainage noted. The wound margin is flat and intact. There is medium (34-66%) pink granulation within the wound bed. There is a medium (34-66%) amount of necrotic tissue within the wound bed including Adherent Slough. The periwound skin appearance had no abnormalities noted for texture. The periwound skin appearance exhibited: Hemosiderin Staining. The periwound skin appearance did not exhibit: Dry/Scaly, Maceration, Erythema. Periwound temperature was noted as No Abnormality. The  periwound has tenderness on palpation. Assessment Active Problems ICD-10 Non-pressure chronic ulcer of right ankle with fat layer exposed Venous insufficiency (chronic) (peripheral) Chronic diastolic (congestive) heart failure Other obesity due to excess calories Procedures Wound #6 Pre-procedure diagnosis of Wound #6 is a Venous Leg Ulcer located on the Right,Lateral Ankle .Severity of Tissue Pre Debridement is: Fat layer exposed. There was a Selective/Open Wound Non-Viable Tissue Debridement with a total area of 0.3 sq cm performed by Fredirick Maudlin, MD. With the following instrument(s): Curette to remove Non-Viable tissue/material. Material removed includes [facility] after achieving pain control using Lidocaine 4% Topical Solution. No specimens were taken. A time out was conducted at 12:57, prior to the start of the procedure. A Minimum amount of bleeding was controlled with Pressure. The procedure was tolerated well. Post Debridement Measurements: 0.6cm length x 0.5cm width x 0.2cm depth; 0.047cm^3 volume. Character of Wound/Ulcer Post Debridement requires further debridement. Severity of Tissue Post Debridement is: Fat layer exposed. Post procedure Diagnosis Wound #6: Same as  Pre-Procedure General Notes: Scribed for Dr. Celine Ahr by Blanche East, RN. Pre-procedure diagnosis of Wound #6 is a Venous Leg Ulcer located on the Right,Lateral Ankle. A skin graft procedure using a bioengineered skin substitute/cellular or tissue based product was performed by Fredirick Maudlin, MD with the following instrument(s): Blade and Curette. Apligraf was applied and secured with Steri-Strips. 4 sq cm of product was utilized and 35 sq cm was wasted due to size of wound. Post Application, [employer] was applied. A Time Out was conducted at 13:00, prior to the start of the procedure. The procedure was tolerated well. Post procedure Diagnosis Wound #6: Same as Pre-Procedure General Notes: Scribed for Dr. Celine Ahr by Blanche East, RN. Pre-procedure diagnosis of Wound #6 is a Venous Leg Ulcer located on the Right,Lateral Ankle . There was a Double Layer Compression Therapy Procedure by Blanche East, RN. Post procedure Diagnosis Wound #6: Same as Pre-Procedure Notes: URGO lite. [place], [place] H ([id number]) [id number] Page 9 of 12 Follow-up Appointments: Return Appointment in 2 weeks. - Dr. Celine Ahr RM 1 Nurse Visit: - Friday 01/05/23 @ 11:15 pm RM 1 post apligraf (outers change) Anesthetic: (In clinic) Topical Lidocaine 4% applied to wound bed Cellular or Tissue Based Products: Wound #6 Right,Lateral Ankle: Cellular or Tissue Based Product Type: - APLIGRAF #4 Cellular or Tissue Based Product applied to wound bed, secured with steri-strips, cover with Adaptic or Mepitel. (DO NOT REMOVE). Bathing/ Shower/ Hygiene: May shower with protection but do not get wound dressing(s) wet. Protect dressing(s) with water repellant cover (for example, large plastic bag) or a cast cover and may then take shower. - may purchase cast protector at [facility], [facility] or [employer] Edema Control - Lymphedema / SCD / Other: Elevate legs to the level of the heart or above for 30 minutes daily  and/or when sitting for 3-4 times a day throughout the day. Avoid standing for long periods of time. Patient to wear own compression stockings every day. Moisturize legs daily. WOUND #6: - Ankle Wound Laterality: Right, Lateral Cleanser: Soap and Water 1 x Per Week/30 Days Discharge Instructions: May shower and wash wound with dial antibacterial soap and water prior to dressing change. Peri-Wound Care: Zinc Oxide Ointment 30g tube 1 x Per Week/30 Days Discharge Instructions: Apply Zinc Oxide to periwound with each dressing change Peri-Wound Care: Sween Lotion (Moisturizing lotion) 1 x Per Week/30 Days Discharge Instructions: Apply moisturizing lotion as directed Prim Dressing: APLIGRAF 1 x Per Week/30 Days ary Secondary Dressing: Woven Gauze  Sponge, Non-Sterile 4x4 in 1 x Per Week/30 Days Discharge Instructions: Apply over primary dressing as directed. Secured With: Transpore Surgical T ape, 2x10 (in/yd) 1 x Per Week/30 Days Discharge Instructions: Secure dressing with tape as directed. Com pression Wrap: ThreePress (3 layer compression wrap) 1 x Per Week/30 Days Discharge Instructions: Apply three layer compression as directed. 01/12/2023: The wound is smaller and has a better fill of granulation tissue. There is just a little slough on the surface and a small remaining area in the center that is still fibrotic. I used a curette to debride the slough off of the wound surface. I then prepared Apligraf by fenestrating it and applied it to the wound. It was trimmed to an appropriate size. It was secured with Adaptic and Steri-Strips. A gauze sponge was used as a bolster. 3 layer compression was applied. She will have a nurse visit next week to change the outer wrap and see me again in 2 weeks' time. Electronic Signature(s) Signed: 01/12/2023 1:10:12 PM By: Fredirick Maudlin MD FACS Entered By: Fredirick Maudlin on 01/12/2023  13:10:11 -------------------------------------------------------------------------------- HxROS Details Patient Name: Date of Service: [name], Suella H. 01/12/2023 12:30 PM Medical Record Number: [id number] Patient Account Number: [id number] Date of Birth/Sex: Treating RN: June 20, 1946 (77 y.o. F) Primary Care Provider: Milus Mallick, MA RITZA Other Clinician: Referring Provider: Treating Provider/Extender: Terese Door, MA RITZA Weeks in Treatment: 14 Information Obtained From Patient Constitutional Symptoms (General Health) Medical History: Past Medical History Notes: obesity Eyes Medical History: Positive for: Cataracts - mild Negative for: Glaucoma; Optic Neuritis Ear/Nose/Mouth/Throat REDIA, TURCO ([id number]) [id number] Page 10 of 12 Medical History: Negative for: Chronic sinus problems/congestion; Middle ear problems Past Medical History Notes: seasonal allergies Hematologic/Lymphatic Medical History: Positive for: Anemia - iron deficiency; Lymphedema Negative for: Hemophilia; Human Immunodeficiency Virus; Sickle Cell Disease Respiratory Medical History: Negative for: Aspiration; Asthma; Chronic Obstructive Pulmonary Disease (COPD); Pneumothorax; Sleep Apnea; Tuberculosis Cardiovascular Medical History: Positive for: Congestive Heart Failure; Hypertension; Peripheral Venous Disease Negative for: Angina; Arrhythmia; Coronary Artery Disease; Deep Vein Thrombosis; Hypotension; Myocardial Infarction; Peripheral Arterial Disease; Phlebitis; Vasculitis Past Medical History Notes: aortic murmur, hyperlipidemia, hypertriglyceridemia Gastrointestinal Medical History: Negative for: Cirrhosis ; Colitis; Crohns; Hepatitis A; Hepatitis B; Hepatitis C Past Medical History Notes: UGI bleed, Endocrine Medical History: Negative for: Type I Diabetes; Type II Diabetes Past Medical History Notes: hypothyroidism Genitourinary Medical  History: Negative for: End Stage Renal Disease Immunological Medical History: Negative for: Lupus Erythematosus; Raynauds; Scleroderma Integumentary (Skin) Medical History: Negative for: History of Burn Musculoskeletal Medical History: Positive for: Osteoarthritis Negative for: Gout; Rheumatoid Arthritis; Osteomyelitis Neurologic Medical History: Positive for: Neuropathy - bila Negative for: Dementia; Quadriplegia; Paraplegia; Seizure Disorder Oncologic Medical History: Negative for: Received Chemotherapy; Received Radiation Psychiatric Medical History: Positive for: Confinement Anxiety Negative for: Anorexia/bulimia HBO Extended History Items Eyes: [name], [name] ([id number]) [id number] Page 11 of 12 Cataracts Immunizations Pneumococcal Vaccine: Received Pneumococcal Vaccination: No Implantable Devices No devices added Hospitalization / Surgery History Type of Hospitalization/Surgery right arm surgery gastric bypass hysterectomy tonsillectomy hernia repair cervical myelopathy with spinal cord compression Family and Social History Cancer: Yes - Mother,Father; Diabetes: Yes - Father; Heart Disease: Yes - Father; Hereditary Spherocytosis: No; Hypertension: Yes - Father; Kidney Disease: No; Lung Disease: No; Seizures: No; Stroke: Yes - Mother; Thyroid Problems: No; Tuberculosis: No; Never smoker; Marital Status - Married; Alcohol Use: Daily - 1.5 bottles of wine daily; Drug Use: No History; Caffeine Use: Daily - coffee; Financial Concerns: No; Food, Clothing  or Shelter Needs: No; Support System Lacking: No; Transportation Concerns: No Electronic Signature(s) Signed: 01/12/2023 1:17:24 PM By: Fredirick Maudlin MD FACS Entered By: Fredirick Maudlin on 01/12/2023 13:08:10 -------------------------------------------------------------------------------- SuperBill Details Patient Name: Date of Service: [name], Abbigayle H. 01/12/2023 Medical  Record Number: [id number] Patient Account Number: [id number] Date of Birth/Sex: Treating RN: 04-10-1946 (77 y.o. F) Primary Care Provider: Milus Mallick, MA RITZA Other Clinician: Referring Provider: Treating Provider/Extender: Terese Door, MA RITZA Weeks in Treatment: 14 Diagnosis Coding ICD-10 Codes Code Description [id number] Non-pressure chronic ulcer of right ankle with fat layer exposed I87.2 Venous insufficiency (chronic) (peripheral) 99991111 Chronic diastolic (congestive) heart failure E66.09 Other obesity due to excess calories Facility Procedures : CPT4 Code: [id number] Description: (Facility Use Only) Apligraf 1 SQ CM Modifier: Quantity: 39 : CPT4 Code: [id number] Description: [id number] - SKIN SUB GRAFT TRNK/ARM/LEG ICD-10 Diagnosis Description [id number] Non-pressure chronic ulcer of right ankle with fat layer exposed Modifier: Quantity: 1 Physician Procedures : CPT4 Code Description Modifier [id number] - WC PHYS LEVEL 3 - EST PT 25 ICD-10 Diagnosis Description [id number] Non-pressure chronic ulcer of right ankle with fat layer exposed I87.2 Venous insufficiency (chronic) (peripheral) 99991111 Chronic diastolic  (congestive) heart failure E66.09 Other obesity due to excess calories Branscom, Lassie H ([id number]. Quantity: 1 pdf Page 12 of 12 : [id number] - WC PHYS SKIN SUB GRAFT TRNK/ARM/LEG 1 ICD-10 Diagnosis Description [id number] Non-pressure chronic ulcer of right ankle with fat layer exposed Quantity: Electronic Signature(s) Signed: 01/12/2023 1:10:34 PM By: Fredirick Maudlin MD FACS Entered By: Fredirick Maudlin on 01/12/2023 13:10:34

## 2023-01-15 ENCOUNTER — Other Ambulatory Visit: Payer: Self-pay | Admitting: Nurse Practitioner

## 2023-01-15 DIAGNOSIS — G8929 Other chronic pain: Secondary | ICD-10-CM

## 2023-01-15 DIAGNOSIS — E039 Hypothyroidism, unspecified: Secondary | ICD-10-CM

## 2023-01-19 ENCOUNTER — Encounter (HOSPITAL_BASED_OUTPATIENT_CLINIC_OR_DEPARTMENT_OTHER): Payer: PPO | Admitting: General Surgery

## 2023-01-19 DIAGNOSIS — L97312 Non-pressure chronic ulcer of right ankle with fat layer exposed: Secondary | ICD-10-CM | POA: Diagnosis not present

## 2023-01-20 NOTE — Progress Notes (Signed)
[name], [name] ([id number]) [id number] Page 1 of 1 Visit Report for 01/19/2023 SuperBill Details Patient Name: Date of Service: [name], [name] 01/19/2023 Medical Record Number: [id number] Patient Account Number: [id number] Date of Birth/Sex: Treating RN: 08/17/1946 (77 y.o. [name], [name] Primary Care Provider: Milus Mallick, MA RITZA Other Clinician: Referring Provider: Treating Provider/Extender: Terese Door, MA RITZA Weeks in Treatment: 15 Diagnosis Coding ICD-10 Codes Code Description [id number] Non-pressure chronic ulcer of right ankle with fat layer exposed I87.2 Venous insufficiency (chronic) (peripheral) 99991111 Chronic diastolic (congestive) heart failure E66.09 Other obesity due to excess calories Facility Procedures CPT4 Code Description Modifier Quantity [id number] (Facility Use Only) [phone] - APPLY MULTLAY COMPRS LWR RT LEG 1 ICD-10 Diagnosis Description [id number] Non-pressure chronic ulcer of right ankle with fat layer exposed Electronic Signature(s) Signed: 01/19/2023 11:30:13 AM By: Blanche East RN Signed: 01/19/2023 12:30:01 PM By: Fredirick Maudlin MD FACS Entered By: Blanche East on 01/19/2023 11:30:13

## 2023-01-20 NOTE — Progress Notes (Addendum)
BAILLIE, NASON ([id number]) [id number] Page 1 of 4 Visit Report for 01/19/2023 Arrival Information Details Patient Name: Date of Service: [name], [name] 01/19/2023 11:15 A M Medical Record Number: [id number] Patient Account Number: [id number] Date of Birth/Sex: Treating RN: Mar 29, 1946 (77 y.o. F) Zochol, [name] Primary Care Brenisha Tsui: Milus Mallick, MA RITZA Other Clinician: Referring Andrell Tallman: Treating Advith Martine/Extender: Terese Door, MA RITZA Weeks in Treatment: 15 Visit Information History Since Last Visit Added or deleted any medications: No Patient Arrived: Cane Any new allergies or adverse reactions: No Arrival Time: 11:15 Had a fall or experienced change in No Accompanied By: husband activities of daily living that may affect Transfer Assistance: None risk of falls: Patient Identification Verified: Yes Signs or symptoms of abuse/neglect since last visito No Secondary Verification Process Completed: Yes Hospitalized since last visit: No Patient Requires Transmission-Based Precautions: No Implantable device outside of the clinic excluding No Patient Has Alerts: No cellular tissue based products placed in the center since last visit: Has Compression in Place as Prescribed: Yes Pain Present Now: No Electronic Signature(s) Signed: 01/19/2023 11:27:18 AM By: Blanche East RN Entered By: Blanche East on 01/19/2023 11:27:18 -------------------------------------------------------------------------------- Compression Therapy Details Patient Name: Date of Service: [name], [name] Balo [name]. 01/19/2023 11:15 A M Medical Record Number: [id number] Patient Account Number: [id number] Date of Birth/Sex: Treating RN: 10-24-1946 (77 y.o. F) Zochol, [name] Primary Care Raileigh Sabater: Milus Mallick, MA RITZA Other Clinician: Referring Shervon Kerwin: Treating Kenidy Crossland/Extender: Terese Door, MA RITZA Weeks in Treatment: 15 Compression Therapy Performed for  Wound Assessment: Wound #6 Right,Lateral Ankle Performed By: Clinician Blanche East, RN Compression Type: Three Layer Electronic Signature(s) Signed: 01/19/2023 11:28:05 AM By: Blanche East RN Entered By: Blanche East on 01/19/2023 11:28:04 -------------------------------------------------------------------------------- Encounter Discharge Information Details Patient Name: Date of Service: [name], [name]. 01/19/2023 11:15 A M Medical Record Number: [id number] Patient Account Number: [id number] Date of Birth/Sex: Treating RN: August 12, 1946 (77 y.o. [name] Primary Care Jayani Rozman: Milus Mallick, MA RITZA Other Clinician: Referring Troi Florendo: Treating Bradie Lacock/Extender: Terese Door, MA RITZA Weeks in Treatment: 15 Encounter Discharge Information Items Discharge Condition: Stable Ambulatory Status: Cane Discharge Destination: Home Transportation: Private Auto Accompanied By: spouse Schedule Follow-up Appointment: Yes Clinical Summary of CareMESHELLE, DUNKLEE ([id number]) [id number] Page 2 of 4 Electronic Signature(s) Signed: 01/19/2023 11:28:45 AM By: Blanche East RN Entered By: Blanche East on 01/19/2023 11:28:45 -------------------------------------------------------------------------------- Patient/Caregiver Education Details Patient Name: Date of Service: [name], [name] 2/23/2024andnbsp11:15 [place] Record Number: [id number] Patient Account Number: [id number] Date of Birth/Gender: Treating RN: July 13, 1946 (77 y.o. F) Zochol, [name] Primary Care Physician: Milus Mallick, MA RITZA Other Clinician: Referring Physician: Treating Physician/Extender: Terese Door, MA RITZA Weeks in Treatment: 15 Education Assessment Education Provided To: Patient Education Topics Provided Peripheral Neuropathy: Methods: Explain/Verbal Responses: Reinforcements needed, State content correctly Wound/Skin Impairment: Methods:  Explain/Verbal Responses: Reinforcements needed, State content correctly Electronic Signature(s) Signed: 01/19/2023 3:38:57 PM By: Blanche East RN Entered By: Blanche East on 01/19/2023 11:28:31 -------------------------------------------------------------------------------- Wound Assessment Details Patient Name: Date of Service: [name], [name]. 01/19/2023 11:15 A M Medical Record Number: [id number] Patient Account Number: [id number] Date of Birth/Sex: Treating RN: September 07, 1946 (77 y.o. F) Zochol, [name] Primary Care Janiel Derhammer: Milus Mallick, MA RITZA Other Clinician: Referring Evalie Hargraves: Treating Kiyani Jernigan/Extender: Terese Door, MA RITZA Weeks in Treatment: 15 Wound Status Wound Number: 6 Primary Venous Leg Ulcer Etiology: Wound Location: Right, Lateral Ankle Wound Open  Wounding Event: Gradually Appeared Status: Date Acquired: 09/25/2022 Comorbid Cataracts, Anemia, Lymphedema, Congestive Heart Failure, Weeks Of Treatment: 15 History: Hypertension, Peripheral Venous Disease, Osteoarthritis, Clustered Wound: No Neuropathy, Confinement Anxiety Wound Measurements Length: (cm) 0.6 Width: (cm) 0.5 Depth: (cm) 0.2 Area: (cm) 0.236 Volume: (cm) 0.047 % Reduction in Area: 77.2% % Reduction in Volume: 54.8% Epithelialization: Small (1-33%) Tunneling: No Undermining: No Wound Description Classification: Full Thickness Without Exposed Support Wound Margin: Flat and Intact Exudate Amount: Medium Exudate Type: Serous Exudate Color: amber Structures Foul Odor After Cleansing: No Slough/Fibrino Yes Wound Bed Granulation Amount: Medium (34-66%) Exposed Structure [name], [name] ([id number]) [id number] Page 3 of 4 Granulation Quality: Pink Fascia Exposed: No Necrotic Amount: Medium (34-66%) Fat Layer (Subcutaneous Tissue) Exposed: Yes Necrotic Quality: Adherent Slough Tendon Exposed: No Muscle Exposed: No Joint Exposed: No Bone Exposed:  No Periwound Skin Texture Texture Color No Abnormalities Noted: Yes No Abnormalities Noted: No Erythema: No Moisture Hemosiderin Staining: Yes No Abnormalities Noted: No Dry / Scaly: No Temperature / Pain Maceration: No Temperature: No Abnormality Tenderness on Palpation: Yes Treatment Notes Wound #6 (Ankle) Wound Laterality: Right, Lateral Cleanser Soap and Water Discharge Instruction: May shower and wash wound with dial antibacterial soap and water prior to dressing change. Peri-Wound Care Zinc Oxide Ointment 30g tube Discharge Instruction: Apply Zinc Oxide to periwound with each dressing change Sween Lotion (Moisturizing lotion) Discharge Instruction: Apply moisturizing lotion as directed Topical Primary Dressing APLIGRAF Secondary Dressing Woven Gauze Sponge, Non-Sterile 4x4 in Discharge Instruction: Apply over primary dressing as directed. Secured With Transpore Surgical Tape, 2x10 (in/yd) Discharge Instruction: Secure dressing with tape as directed. Compression Wrap ThreePress (3 layer compression wrap) Discharge Instruction: Apply three layer compression as directed. Compression Stockings Add-Ons Electronic Signature(s) Signed: 01/19/2023 11:27:52 AM By: Blanche East RN Entered By: Blanche East on 01/19/2023 11:27:52 -------------------------------------------------------------------------------- Vitals Details Patient Name: Date of Service: [name], [name]. 01/19/2023 11:15 A M Medical Record Number: [id number] Patient Account Number: [id number] Date of Birth/Sex: Treating RN: 08-10-1946 (77 y.o. F) Zochol, [name] Primary Care Javonta Gronau: Milus Mallick, MA RITZA Other Clinician: Referring Khaliel Morey: Treating Marleta Lapierre/Extender: Terese Door, MA RITZA Weeks in Treatment: 15 Vital Signs Time Taken: 11:15 Temperature (F): 97.9 Height (in): 65 Pulse (bpm): 73 Weight (lbs): 229 Respiratory Rate (breaths/min): 18 Body Mass Index (BMI): 38.1 Blood  Pressure (mmHg): 140/85 Reference Range: 80 - 120 mg / dl [name], [name] ([id number]) [id number] Page 4 of 4 Electronic Signature(s) Signed: 01/19/2023 11:27:34 AM By: Blanche East RN Entered By: Blanche East on 01/19/2023 11:27:34

## 2023-01-26 ENCOUNTER — Encounter (HOSPITAL_BASED_OUTPATIENT_CLINIC_OR_DEPARTMENT_OTHER): Payer: PPO | Attending: General Surgery | Admitting: General Surgery

## 2023-01-26 DIAGNOSIS — L97821 Non-pressure chronic ulcer of other part of left lower leg limited to breakdown of skin: Secondary | ICD-10-CM | POA: Diagnosis not present

## 2023-01-26 DIAGNOSIS — M1990 Unspecified osteoarthritis, unspecified site: Secondary | ICD-10-CM | POA: Diagnosis not present

## 2023-01-26 DIAGNOSIS — E785 Hyperlipidemia, unspecified: Secondary | ICD-10-CM | POA: Diagnosis not present

## 2023-01-26 DIAGNOSIS — I110 Hypertensive heart disease with heart failure: Secondary | ICD-10-CM | POA: Insufficient documentation

## 2023-01-26 DIAGNOSIS — L97312 Non-pressure chronic ulcer of right ankle with fat layer exposed: Secondary | ICD-10-CM | POA: Diagnosis not present

## 2023-01-26 DIAGNOSIS — Z86718 Personal history of other venous thrombosis and embolism: Secondary | ICD-10-CM | POA: Insufficient documentation

## 2023-01-26 DIAGNOSIS — Z6838 Body mass index (BMI) 38.0-38.9, adult: Secondary | ICD-10-CM | POA: Diagnosis not present

## 2023-01-26 DIAGNOSIS — E6609 Other obesity due to excess calories: Secondary | ICD-10-CM | POA: Diagnosis not present

## 2023-01-26 DIAGNOSIS — E039 Hypothyroidism, unspecified: Secondary | ICD-10-CM | POA: Diagnosis not present

## 2023-01-26 DIAGNOSIS — I5032 Chronic diastolic (congestive) heart failure: Secondary | ICD-10-CM | POA: Insufficient documentation

## 2023-01-26 DIAGNOSIS — Z9884 Bariatric surgery status: Secondary | ICD-10-CM | POA: Insufficient documentation

## 2023-01-26 DIAGNOSIS — I872 Venous insufficiency (chronic) (peripheral): Secondary | ICD-10-CM | POA: Insufficient documentation

## 2023-01-29 NOTE — Progress Notes (Addendum)
[name], [name] ([id number]) [id number] Page 1 of 11 Visit Report for 01/26/2023 Chief Complaint Document Details Patient Name: Date of Service: [name], [name] 01/26/2023 1:15 PM Medical Record Number: [id number] Patient Account Number: [id number] Date of Birth/Sex: Treating RN: 12-Feb-1946 (77 y.o. F) Primary Care Provider: Lynnda Shields, [name] N Other Clinician: Referring Provider: Treating Provider/Extender: Sheryle Spray, [place] RGA N Weeks in Treatment: 16 Information Obtained from: Patient Chief Complaint Bilateral LE Ulcers 10/05/2022: RLE ulcer Electronic Signature(s) Signed: 01/26/2023 2:00:09 PM By: Fredirick Maudlin MD FACS Entered By: Fredirick Maudlin on 01/26/2023 14:00:09 -------------------------------------------------------------------------------- Cellular or Tissue Based Product Details Patient Name: Date of Service: [facility], [name] 01/26/2023 1:15 PM Medical Record Number: [id number] Patient Account Number: [id number] Date of Birth/Sex: Treating RN: [birth year] (77 y.o. F) Primary Care Provider: Inetta Fermo [facility] N Other Clinician: Referring Provider: Treating Provider/Extender: Sheryle Spray, [place] RGA N Weeks in Treatment: 16 Cellular or Tissue Based Product Type Wound #6 Right,Lateral Ankle Applied to: Performed By: Physician Fredirick Maudlin, MD Cellular or Tissue Based Product Type: Apligraf Level of Consciousness (Pre-procedure): Awake and Alert Pre-procedure Verification/Time Out Yes - 13:47 Taken: Location: trunk / arms / legs Wound Size (sq cm): 0.8 Product Size (sq cm): 44 Waste Size (sq cm): 34 Waste Reason: size of wound Amount of Product Applied (sq cm): 10 Instrument Used: Forceps, Scissors Lot #: [id number] Order #: [id number] Expiration Date: 01/26/2023 Fenestrated: Yes Instrument: Blade Reconstituted: Yes Solution Type: normal saline Solution Amount: 10 Lot #: [id number] Solution Expiration  Date: 04/26/2025 Secured: Yes Secured With: Steri-Strips Dressing Applied: Yes Primary Dressing: [employer] Procedural Pain: 0 Post Procedural Pain: 0 Response to Treatment: Procedure was tolerated well Level of Consciousness (Post- Awake and Alert procedure): Post Procedure Diagnosis Same as Pre-procedure [name], [name] ([id number]) [phone].pdf Page 2 of 11 Notes Scribed for Dr. Celine Ahr by Blanche East, RN Electronic Signature(s) Signed: 02/19/2023 9:54:04 AM By: Lonell Face Signed: 02/19/2023 5:13:01 PM By: Fredirick Maudlin MD FACS Previous Signature: 01/26/2023 2:07:09 PM Version By: Fredirick Maudlin MD FACS Entered By: Lonell Face on 02/19/2023 09:54:04 -------------------------------------------------------------------------------- Debridement Details Patient Name: Date of Service: Willeen [name], [name] 01/26/2023 1:15 PM Medical Record Number: [id number] Patient Account Number: [id number] Date of Birth/Sex: Treating RN: Apr 10, 1946 (77 y.o. [name], [name] Primary Care Provider: Lynnda Shields, [place] [facility] N Other Clinician: Referring Provider: Treating Provider/Extender: Sheryle Spray, [place] RGA N Weeks in Treatment: 16 Debridement Performed for Assessment: Wound #6 Right,Lateral Ankle Performed By: Physician Fredirick Maudlin, MD Debridement Type: Debridement Severity of Tissue Pre Debridement: Fat layer exposed Level of Consciousness (Pre-procedure): Awake and Alert Pre-procedure Verification/Time Out Yes - 13:44 Taken: Start Time: 13:45 Pain Control: Lidocaine 4% T opical Solution T Area Debrided (L x W): otal 1 (cm) x 0.8 (cm) = 0.8 (cm) Tissue and other material debrided: Non-Viable, Eschar, Slough, Slough Level: Non-Viable Tissue Debridement Description: Selective/Open Wound Instrument: Curette Bleeding: Minimum Hemostasis Achieved: Pressure Response to Treatment: Procedure was tolerated well Level of Consciousness (Post- Awake and  Alert procedure): Post Debridement Measurements of Total Wound Length: (cm) 1 Width: (cm) 0.8 Depth: (cm) 0.1 Volume: (cm) 0.063 Character of Wound/Ulcer Post Debridement: Requires Further Debridement Severity of Tissue Post Debridement: Fat layer exposed Post Procedure Diagnosis Same as Pre-procedure Notes Scribed for Dr. Celine Ahr by Blanche East, RN Electronic Signature(s) Signed: 01/26/2023 2:08:45 PM By: Fredirick Maudlin MD FACS Signed: 01/26/2023 4:09:38 PM By: Blanche East RN Entered By: Blanche East on 01/26/2023 13:47:15 -------------------------------------------------------------------------------- HPI  Details Patient Name: Date of Service: [name], [name] 01/26/2023 1:15 PM Medical Record Number: [id number] Patient Account Number: [id number] Date of Birth/Sex: Treating RN: 1946/04/27 (77 y.o. F) Primary Care Provider: Inetta Fermo [facility] N Other Clinician: Referring Provider: Treating Provider/Extender: Sheryle Spray, [place] RGA N Weeks in Treatment: 16 History of Present Illness KOLIN, Irma H ([id number]) [id number] Page 3 of 11 HPI Description: 07/09/18 on evaluation today patient presents for bilateral lower extremity ulcers. She has a past medical history significant for chronic venous stasis, hypertension, and lower extremity edema with associated skin changes. Unfortunately she tells me that roughly 3 weeks ago she noted ulcers on the left lower extremity which started given her trouble. Subsequently she ended up proceeding with applying mupirocin ointment and was keeping this open air for the most part. She states that it would scab over somewhat and then reopened. The wounds have been training quite significantly. Fortunately there does not appear to be any evidence of overt infection there is some erythematous type skin changes but I believe this is likely stasis dermatitis nothing more. Fortunately she has no evidence of any  systemic infection. No fevers, chills, nausea, or vomiting noted at this time. She has been recommended before to wear compression stockings but she does not wear them on a regular basis. She has never been suggested to get Juxta-Lite compression wraps she was not aware of what these were. Patient had venous studies performed on 11/09/17 which showed no lower surety DVT or deep vein insufficiency noted. She did not have evaluation however of the superficial veins. There was full compressibility noted throughout. Her arterial study which was performed on 11/18/17 actually showed that she has an ABI on the right of 1.17 with a TBI of 1.59 along with an ABI of 1.12 on the left and a TBI of 0.99. 07/17/18 on evaluation today patient appears to be doing much better in regard to her left lower Trinity ulcers. She's been tolerating the dressing changes without complication. There does not appear to be any evidence of infection which is good news. Overall I'm very pleased with the progress at this time. 07/24/18 on evaluation today patient's wound actually appears to be completely healed at this time. Fortunately there does not appear to be any evidence of infection which is good news. Overall I'm very happy with the progress that has been made. She did get her Velcro compression wraps. READMISSION 12/31/2020 This is a patient we had in this clinic in 2019 felt to have chronic venous insufficiency wounds on her lower extremities. She was discharged with bilateral compression stockings. She tells [place] that she never really wore them or at best wore them sparingly. She is back in clinic today having a ulcer on her left lateral lower extremity since October and one on the right anterior for the last 2 weeks. I do not know that she is doing any primary dressing on these certainly not wearing any form of compression. She is already been evaluated by vein and vascular on 11/15/2020. They noted that she did have ulcers on  the left leg in October that healed with a need Unna boot. She had a reflux study on 12/20 that did not show evidence of a DVT or venous reflux bilaterally. They was not felt that she had arterial insufficiency based on the fact that she had brisk biphasic Doppler signals in the bilateral DP PT. The patient comes in today with superficial wounds on the right anterior  mid tibia and the left lateral calf. These are small appear to have healthy surfaces. Past medical history includes cervical and upper thoracic spine surgery on 7/21, hernia repair early in January, hypertension, hyperlipidemia, hypothyroidism, gastric bypass surgery, DVT in the right peroneal artery and left popliteal and peroneal veins. Next problem CHF. She does not have diabetes ABIs in our clinic were 1.1 on the right and 1.2 on the lower 2/11; the patient's wound on the left lateral and right anterior lower leg are closed for edema control is good. She has an assortment of old juxta lite stockings with old liners. She does not like to wear anything in particular under the compression part of the juxta lite stocking. I think she is going to do exactly what she wants. READMISSION 10/05/2022 This is a 77 year old woman last seen in our clinic about 18 months ago. She has a history of venous stasis and stasis dermatitis. A reflux study done in 2021 did not show any evidence of venous reflux. She says about 3 weeks ago, she noticed an ulcer on her right lateral malleolus. She was applying triple antibiotic ointment to it for a while and then was soaking it in Epsom salts. She reports that she usually wears compression stockings, but that they were rubbing so she did not wear 1 on her right leg since the wound appeared, she has a stocking with the toe and heel cut out applied to her left leg. On examination, she has changes of chronic stasis dermatitis on the right. There is a shallow ulcer overlying the lateral malleolus. The fat layer  is exposed. It is a little bit desiccated but fairly clean with just some light eschar and slough present. No malodor or purulent drainage. 10/13/2022: The wound is a little smaller today. Although the moisture balance is better than last week, it is still a bit dry. There is still some slough accumulation on the surface. 10/25/2022: The wound continues to contract. There is still thick layer of slough on the surface. The moisture balance of the wound is better, but the periwound skin is slightly macerated. 11/02/2022: The wound is smaller and shallower today. There is still slough on the surface, but the periwound skin is intact. The wound surface is still drier than ideal. 12/15; wound is about the same size minimal depth. Under illumination some debris on the surface she has been using Prisma 11/16/2022: The leg wrap became saturated when her makeshift cast protector leaked. The wound is a little bit larger today, as result of moisture-related tissue breakdown. The surface remains fairly fibrotic. 11/30/2022: The wound measured a little bit larger today secondary to additional moisture related periwound breakdown. There is slough on the wound surface. Still fairly fibrotic underneath. 12/05/2022: The ankle wound is a little smaller but still has slough accumulation. She has been approved for Apligraf and we are going to apply that today. 12/15/2022: The wound is smaller again today. Minimal slough. Edema control is good. 12/29/2022: The wound continues to contract. The surface is a little bit less fibrotic today. Light slough accumulation. 01/12/2023: The wound is smaller and has a better fill of granulation tissue. There is just a little slough on the surface and a small remaining area in the center that is still fibrotic. 01/26/2023: The wound measured larger for some reason today, but on visual inspection it appears about the same size. There is more granulation tissue and just a very small area in the  center that remains fibrotic. Minimal  slough. Electronic Signature(s) Signed: 01/26/2023 2:00:48 PM By: Fredirick Maudlin MD FACS Entered By: Fredirick Maudlin on 01/26/2023 14:00:48 -------------------------------------------------------------------------------- Physical Exam Details Patient Name: Date of Service: Willeen [name], Arshia H. 01/26/2023 1:15 PM [name] ([id number] Page 4 of 11 Medical Record Number: [id number] Patient Account Number: [id number] Date of Birth/Sex: Treating RN: [birth year] (77 y.o. F) Primary Care Provider: Lynnda Shields, [name] [facility] N Other Clinician: Referring Provider: Treating Provider/Extender: Sheryle Spray, [place] RGA N Weeks in Treatment: 16 Constitutional ..... Respiratory Normal work of breathing on room air. Notes 01/26/2023: The wound measured larger for some reason today, but on visual inspection it appears about the same size. There is more granulation tissue and just a very small area in the center that remains fibrotic. Minimal slough. Electronic Signature(s) Signed: 01/26/2023 2:05:07 PM By: Fredirick Maudlin MD FACS Entered By: Fredirick Maudlin on 01/26/2023 14:05:07 -------------------------------------------------------------------------------- Physician Orders Details Patient Name: Date of Service: Willeen [name], Jazmine H. 01/26/2023 1:15 PM Medical Record Number: [id number] Patient Account Number: [id number] Date of Birth/Sex: Treating RN: 06/04/1946 (76 y.o. F) Zochol, [name] Primary Care Provider: Lynnda Shields, [place] [facility] N Other Clinician: Referring Provider: Treating Provider/Extender: Sheryle Spray, [place] RGA N Weeks in Treatment: 90 Verbal / Phone Orders: No Diagnosis Coding ICD-10 Coding Code Description [id number] Non-pressure chronic ulcer of right ankle with fat layer exposed I87.2 Venous insufficiency (chronic) (peripheral) 99991111 Chronic diastolic (congestive) heart failure E66.09 Other  obesity due to excess calories Follow-up Appointments ppointment in 2 weeks. - Dr. Celine Ahr RM 1 Return A Friday 02/09/23 at 12:30pm Nurse Visit: - Friday 02/02/23 @ 11:15 pm RM 1 post apligraf (outers change) Anesthetic (In clinic) Topical Lidocaine 4% applied to wound bed Cellular or Tissue Based Products Wound #6 Right,Lateral Ankle Cellular or Tissue Based Product Type: - APLIGRAF #4 Cellular or Tissue Based Product applied to wound bed, secured with steri-strips, cover with Adaptic or Mepitel. (DO NOT REMOVE). Bathing/ Shower/ Hygiene May shower with protection but do not get wound dressing(s) wet. Protect dressing(s) with water repellant cover (for example, large plastic bag) or a cast cover and may then take shower. - may purchase cast protector at [facility], [facility] or [employer] Edema Control - Lymphedema / SCD / Other Bilateral Lower Extremities Elevate legs to the level of the heart or above for 30 minutes daily and/or when sitting for 3-4 times a day throughout the day. Avoid standing for long periods of time. Patient to wear own compression stockings every day. Moisturize legs daily. Wound Treatment Wound #6 - Ankle Wound Laterality: Right, Lateral Cleanser: Soap and Water 1 x Per Week/30 Days Discharge Instructions: May shower and wash wound with dial antibacterial soap and water prior to dressing change. Peri-Wound Care: Zinc Oxide Ointment 30g tube 1 x Per Week/30 Days Discharge Instructions: Apply Zinc Oxide to periwound with each dressing change TENIA, HARK ([id number]) [id number].pdf Page 5 of 11 Peri-Wound Care: Sween Lotion (Moisturizing lotion) 1 x Per Week/30 Days Discharge Instructions: Apply moisturizing lotion as directed Prim Dressing: APLIGRAF ary 1 x Per Week/30 Days Secondary Dressing: Woven Gauze Sponge, Non-Sterile 4x4 in 1 x Per Week/30 Days Discharge Instructions: Apply over primary dressing as directed. Secured With: Transpore  Surgical Tape, 2x10 (in/yd) 1 x Per Week/30 Days Discharge Instructions: Secure dressing with tape as directed. Compression Wrap: ThreePress (3 layer compression wrap) 1 x Per Week/30 Days Discharge Instructions: Apply three layer compression as directed. Electronic Signature(s) Signed: 01/26/2023 2:08:45 PM By: Fredirick Maudlin MD FACS Entered  By: Fredirick Maudlin on 01/26/2023 14:05:24 -------------------------------------------------------------------------------- Problem List Details Patient Name: Date of Service: AMYRACLE, SCELSI 01/26/2023 1:15 PM Medical Record Number: [id number] Patient Account Number: [id number] Date of Birth/Sex: Treating RN: 1946-04-07 (77 y.o. F) Primary Care Provider: Lynnda Shields, [place] [facility] N Other Clinician: Referring Provider: Treating Provider/Extender: Sheryle Spray, [place] RGA N Weeks in Treatment: 16 Active Problems ICD-10 Encounter Code Description Active Date MDM Diagnosis L97.312 Non-pressure chronic ulcer of right ankle with fat layer exposed 10/05/2022 No Yes I87.2 Venous insufficiency (chronic) (peripheral) 10/05/2022 No Yes 99991111 Chronic diastolic (congestive) heart failure 10/05/2022 No Yes E66.09 Other obesity due to excess calories 10/05/2022 No Yes Inactive Problems Resolved Problems Electronic Signature(s) Signed: 01/26/2023 1:57:37 PM By: Fredirick Maudlin MD FACS Entered By: Fredirick Maudlin on 01/26/2023 13:57:37 -------------------------------------------------------------------------------- Progress Note Details Patient Name: Date of Service: Willeen [name], Addisen H. 01/26/2023 1:15 PM Medical Record Number: [id number] Patient Account Number: [id number] Date of Birth/Sex: Treating RN: 11-Jan-1946 (77 y.o. F) Primary Care Provider: Inetta Fermo [facility] N Other Clinician: Referring Provider: Treating Provider/Extender: Sheryle Spray, [place] RGA N Weeks in Treatment: [address], Jossalyn H ([id number])  [phone].pdf Page 6 of 11 Subjective Chief Complaint Information obtained from Patient Bilateral LE Ulcers 10/05/2022: RLE ulcer History of Present Illness (HPI) 07/09/18 on evaluation today patient presents for bilateral lower extremity ulcers. She has a past medical history significant for chronic venous stasis, hypertension, and lower extremity edema with associated skin changes. Unfortunately she tells me that roughly 3 weeks ago she noted ulcers on the left lower extremity which started given her trouble. Subsequently she ended up proceeding with applying mupirocin ointment and was keeping this open air for the most part. She states that it would scab over somewhat and then reopened. The wounds have been training quite significantly. Fortunately there does not appear to be any evidence of overt infection there is some erythematous type skin changes but I believe this is likely stasis dermatitis nothing more. Fortunately she has no evidence of any systemic infection. No fevers, chills, nausea, or vomiting noted at this time. She has been recommended before to wear compression stockings but she does not wear them on a regular basis. She has never been suggested to get Juxta-Lite compression wraps she was not aware of what these were. Patient had venous studies performed on 11/09/17 which showed no lower surety DVT or deep vein insufficiency noted. She did not have evaluation however of the superficial veins. There was full compressibility noted throughout. Her arterial study which was performed on 11/18/17 actually showed that she has an ABI on the right of 1.17 with a TBI of 1.59 along with an ABI of 1.12 on the left and a TBI of 0.99. 07/17/18 on evaluation today patient appears to be doing much better in regard to her left lower Trinity ulcers. She's been tolerating the dressing changes without complication. There does not appear to be any evidence of infection which  is good news. Overall I'm very pleased with the progress at this time. 07/24/18 on evaluation today patient's wound actually appears to be completely healed at this time. Fortunately there does not appear to be any evidence of infection which is good news. Overall I'm very happy with the progress that has been made. She did get her Velcro compression wraps. READMISSION 12/31/2020 This is a patient we had in this clinic in 2019 felt to have chronic venous insufficiency wounds on her lower extremities. She was discharged with bilateral  compression stockings. She tells [place] that she never really wore them or at best wore them sparingly. She is back in clinic today having a ulcer on her left lateral lower extremity since October and one on the right anterior for the last 2 weeks. I do not know that she is doing any primary dressing on these certainly not wearing any form of compression. She is already been evaluated by vein and vascular on 11/15/2020. They noted that she did have ulcers on the left leg in October that healed with a need Unna boot. She had a reflux study on 12/20 that did not show evidence of a DVT or venous reflux bilaterally. They was not felt that she had arterial insufficiency based on the fact that she had brisk biphasic Doppler signals in the bilateral DP PT. The patient comes in today with superficial wounds on the right anterior mid tibia and the left lateral calf. These are small appear to have healthy surfaces. Past medical history includes cervical and upper thoracic spine surgery on 7/21, hernia repair early in January, hypertension, hyperlipidemia, hypothyroidism, gastric bypass surgery, DVT in the right peroneal artery and left popliteal and peroneal veins. Next problem CHF. She does not have diabetes ABIs in our clinic were 1.1 on the right and 1.2 on the lower 2/11; the patient's wound on the left lateral and right anterior lower leg are closed for edema control is good. She has  an assortment of old juxta lite stockings with old liners. She does not like to wear anything in particular under the compression part of the juxta lite stocking. I think she is going to do exactly what she wants. READMISSION 10/05/2022 This is a 77 year old woman last seen in our clinic about 18 months ago. She has a history of venous stasis and stasis dermatitis. A reflux study done in 2021 did not show any evidence of venous reflux. She says about 3 weeks ago, she noticed an ulcer on her right lateral malleolus. She was applying triple antibiotic ointment to it for a while and then was soaking it in Epsom salts. She reports that she usually wears compression stockings, but that they were rubbing so she did not wear 1 on her right leg since the wound appeared, she has a stocking with the toe and heel cut out applied to her left leg. On examination, she has changes of chronic stasis dermatitis on the right. There is a shallow ulcer overlying the lateral malleolus. The fat layer is exposed. It is a little bit desiccated but fairly clean with just some light eschar and slough present. No malodor or purulent drainage. 10/13/2022: The wound is a little smaller today. Although the moisture balance is better than last week, it is still a bit dry. There is still some slough accumulation on the surface. 10/25/2022: The wound continues to contract. There is still thick layer of slough on the surface. The moisture balance of the wound is better, but the periwound skin is slightly macerated. 11/02/2022: The wound is smaller and shallower today. There is still slough on the surface, but the periwound skin is intact. The wound surface is still drier than ideal. 12/15; wound is about the same size minimal depth. Under illumination some debris on the surface she has been using Prisma 11/16/2022: The leg wrap became saturated when her makeshift cast protector leaked. The wound is a little bit larger today, as  result of moisture-related tissue breakdown. The surface remains fairly fibrotic. 11/30/2022: The wound measured  a little bit larger today secondary to additional moisture related periwound breakdown. There is slough on the wound surface. Still fairly fibrotic underneath. 12/05/2022: The ankle wound is a little smaller but still has slough accumulation. She has been approved for Apligraf and we are going to apply that today. 12/15/2022: The wound is smaller again today. Minimal slough. Edema control is good. 12/29/2022: The wound continues to contract. The surface is a little bit less fibrotic today. Light slough accumulation. 01/12/2023: The wound is smaller and has a better fill of granulation tissue. There is just a little slough on the surface and a small remaining area in the center that is still fibrotic. 01/26/2023: The wound measured larger for some reason today, but on visual inspection it appears about the same size. There is more granulation tissue and just a very small area in the center that remains fibrotic. Minimal slough. Patient History Information obtained from Patient. ASHITA, FLEURY ([id number]) [id number] Page 7 of 11 Family History Cancer - Mother,Father, Diabetes - Father, Heart Disease - Father, Hypertension - Father, Stroke - Mother, No family history of Hereditary Spherocytosis, Kidney Disease, Lung Disease, Seizures, Thyroid Problems, Tuberculosis. Social History Never smoker, Marital Status - Married, Alcohol Use - Daily - 1.5 bottles of wine daily, Drug Use - No History, Caffeine Use - Daily - coffee. Medical History Eyes Patient has history of Cataracts - mild Denies history of Glaucoma, Optic Neuritis Ear/Nose/Mouth/Throat Denies history of Chronic sinus problems/congestion, Middle ear problems Hematologic/Lymphatic Patient has history of Anemia - iron deficiency, Lymphedema Denies history of Hemophilia, Human Immunodeficiency Virus,  Sickle Cell Disease Respiratory Denies history of Aspiration, Asthma, Chronic Obstructive Pulmonary Disease (COPD), Pneumothorax, Sleep Apnea, Tuberculosis Cardiovascular Patient has history of Congestive Heart Failure, Hypertension, Peripheral Venous Disease Denies history of Angina, Arrhythmia, Coronary Artery Disease, Deep Vein Thrombosis, Hypotension, Myocardial Infarction, Peripheral Arterial Disease, Phlebitis, Vasculitis Gastrointestinal Denies history of Cirrhosis , Colitis, Crohnoos, Hepatitis A, Hepatitis B, Hepatitis C Endocrine Denies history of Type I Diabetes, Type II Diabetes Genitourinary Denies history of End Stage Renal Disease Immunological Denies history of Lupus Erythematosus, Raynaudoos, Scleroderma Integumentary (Skin) Denies history of History of Burn Musculoskeletal Patient has history of Osteoarthritis Denies history of Gout, Rheumatoid Arthritis, Osteomyelitis Neurologic Patient has history of Neuropathy - bila Denies history of Dementia, Quadriplegia, Paraplegia, Seizure Disorder Oncologic Denies history of Received Chemotherapy, Received Radiation Psychiatric Patient has history of Confinement Anxiety Denies history of Anorexia/bulimia Hospitalization/Surgery History - right arm surgery. - gastric bypass. - hysterectomy. - tonsillectomy. - hernia repair. - cervical myelopathy with spinal cord compression. Medical A Surgical History Notes nd Constitutional Symptoms (General Health) obesity Ear/Nose/Mouth/Throat seasonal allergies Cardiovascular aortic murmur, hyperlipidemia, hypertriglyceridemia Gastrointestinal UGI bleed, Endocrine hypothyroidism Objective Constitutional Vitals Time Taken: 1:21 PM, Height: 65 in, Weight: 229 lbs, BMI: 38.1, Temperature: 97.8 F, Pulse: 71 bpm, Respiratory Rate: 18 breaths/min, Blood Pressure: 135/81 mmHg. Respiratory Normal work of breathing on room air. General Notes: 01/26/2023: The wound measured larger  for some reason today, but on visual inspection it appears about the same size. There is more granulation tissue and just a very small area in the center that remains fibrotic. Minimal slough. Integumentary (Hair, Skin) Wound #6 status is Open. Original cause of wound was Gradually Appeared. The date acquired was: 09/25/2022. The wound has been in treatment 16 weeks. The wound is located on the Right,Lateral Ankle. The wound measures 1cm length x 0.8cm width x 0.2cm depth; 0.628cm^2 area and 0.126cm^3 volume. There is Fat Layer (Subcutaneous  Tissue) exposed. There is no tunneling or undermining noted. There is a medium amount of serous drainage noted. The wound margin is flat and intact. There is large (67-100%) red, pink granulation within the wound bed. There is a small (1-33%) amount of necrotic tissue within the wound bed including Adherent Slough. The periwound skin appearance had no abnormalities noted for texture. The periwound skin appearance exhibited: Hemosiderin Staining. The periwound skin appearance did not exhibit: Dry/Scaly, Maceration, Erythema. Periwound temperature was noted as No Abnormality. The periwound has tenderness on palpation. SAKINA, ORFANOS ([id number]) [id number] Page 8 of 11 Assessment Active Problems ICD-10 Non-pressure chronic ulcer of right ankle with fat layer exposed Venous insufficiency (chronic) (peripheral) Chronic diastolic (congestive) heart failure Other obesity due to excess calories Procedures Wound #6 Pre-procedure diagnosis of Wound #6 is a Venous Leg Ulcer located on the Right,Lateral Ankle .Severity of Tissue Pre Debridement is: Fat layer exposed. There was a Selective/Open Wound Non-Viable Tissue Debridement with a total area of 0.8 sq cm performed by Fredirick Maudlin, MD. With the following instrument(s): Curette to remove Non-Viable tissue/material. Material removed includes Eschar and Slough and after achieving  pain control using Lidocaine 4% Topical Solution. No specimens were taken. A time out was conducted at 13:44, prior to the start of the procedure. A Minimum amount of bleeding was controlled with Pressure. The procedure was tolerated well. Post Debridement Measurements: 1cm length x 0.8cm width x 0.1cm depth; 0.063cm^3 volume. Character of Wound/Ulcer Post Debridement requires further debridement. Severity of Tissue Post Debridement is: Fat layer exposed. Post procedure Diagnosis Wound #6: Same as Pre-Procedure General Notes: Scribed for Dr. Celine Ahr by Blanche East, RN. Pre-procedure diagnosis of Wound #6 is a Venous Leg Ulcer located on the Right,Lateral Ankle. A skin graft procedure using a bioengineered skin substitute/cellular or tissue based product was performed by Fredirick Maudlin, MD with the following instrument(s): Forceps and Scissors. Apligraf was applied and secured with Steri-Strips. 10 sq cm of product was utilized and 34 sq cm was wasted due to size of wound. Post Application, Adapt[employer] was applied. A Time Out was conducted at 13:47, prior to the start of the procedure. The procedure was tolerated well with a pain level of 0 throughout and a pain level of 0 following the procedure. Post procedure Diagnosis Wound #6: Same as Pre-Procedure General Notes: Scribed for Dr. Celine Ahr by Blanche East, RN. Pre-procedure diagnosis of Wound #6 is a Venous Leg Ulcer located on the Right,Lateral Ankle . There was a Three Layer Compression Therapy Procedure by Blanche East, RN. Post procedure Diagnosis Wound #6: Same as Pre-Procedure Plan Follow-up Appointments: Return Appointment in 2 weeks. - Dr. Celine Ahr RM 1 Friday 02/09/23 at 12:30pm Nurse Visit: - Friday 02/02/23 @ 11:15 pm RM 1 post apligraf (outers change) Anesthetic: (In clinic) Topical Lidocaine 4% applied to wound bed Cellular or Tissue Based Products: Wound #6 Right,Lateral Ankle: Cellular or Tissue Based Product Type: - APLIGRAF  #4 Cellular or Tissue Based Product applied to wound bed, secured with steri-strips, cover with Adaptic or Mepitel. (DO NOT REMOVE). Bathing/ Shower/ Hygiene: May shower with protection but do not get wound dressing(s) wet. Protect dressing(s) with water repellant cover (for example, large plastic bag) or a cast cover and may then take shower. - may purchase cast protector at [facility], [facility] or [employer] Edema Control - Lymphedema / SCD / Other: Elevate legs to the level of the heart or above for 30 minutes daily and/or when sitting for 3-4 times a day throughout the  day. Avoid standing for long periods of time. Patient to wear own compression stockings every day. Moisturize legs daily. WOUND #6: - Ankle Wound Laterality: Right, Lateral Cleanser: Soap and Water 1 x Per Week/30 Days Discharge Instructions: May shower and wash wound with dial antibacterial soap and water prior to dressing change. Peri-Wound Care: Zinc Oxide Ointment 30g tube 1 x Per Week/30 Days Discharge Instructions: Apply Zinc Oxide to periwound with each dressing change Peri-Wound Care: Sween Lotion (Moisturizing lotion) 1 x Per Week/30 Days Discharge Instructions: Apply moisturizing lotion as directed Prim Dressing: APLIGRAF 1 x Per Week/30 Days ary Secondary Dressing: Woven Gauze Sponge, Non-Sterile 4x4 in 1 x Per Week/30 Days Discharge Instructions: Apply over primary dressing as directed. Secured With: Transpore Surgical T ape, 2x10 (in/yd) 1 x Per Week/30 Days Discharge Instructions: Secure dressing with tape as directed. Com pression Wrap: ThreePress (3 layer compression wrap) 1 x Per Week/30 Days Discharge Instructions: Apply three layer compression as directed. 01/26/2023: The wound measured larger for some reason today, but on visual inspection it appears about the same size. There is more granulation tissue and just a very small area in the center that remains fibrotic. Minimal slough. AMANDINE, AUGER ([id number])  [id number] Page 9 of 11 I used a curette to debride slough and eschar from the wound. I then prepared Apligraf #5, fenestrated it and applied it to the wound in standard fashion. It was trimmed to the appropriate size and secured with Adaptic and Steri-Strips. A gauze sponge was used as a bolster. 3 layer compression was applied by the nurse. She will have a nurse visit next week to have her wrap changed and follow-up with me in 2 weeks for a wound care visit. Electronic Signature(s) Signed: 02/19/2023 4:40:03 PM By: Deon Pilling RN, BSN Signed: 02/19/2023 5:13:01 PM By: Fredirick Maudlin MD FACS Previous Signature: 01/26/2023 2:07:24 PM Version By: Fredirick Maudlin MD FACS Previous Signature: 01/26/2023 2:06:22 PM Version By: Fredirick Maudlin MD FACS Entered By: Deon Pilling on 02/19/2023 14:44:02 -------------------------------------------------------------------------------- HxROS Details Patient Name: Date of Service: Willeen [name], Mearl H. 01/26/2023 1:15 PM Medical Record Number: [id number] Patient Account Number: [id number] Date of Birth/Sex: Treating RN: 1946-09-16 (77 y.o. F) Primary Care Provider: Lynnda Shields, [place] [facility] N Other Clinician: Referring Provider: Treating Provider/Extender: Sheryle Spray, [place] [facility] N Weeks in Treatment: 16 Information Obtained From Patient Constitutional Symptoms (General Health) Medical History: Past Medical History Notes: obesity Eyes Medical History: Positive for: Cataracts - mild Negative for: Glaucoma; Optic Neuritis Ear/Nose/Mouth/Throat Medical History: Negative for: Chronic sinus problems/congestion; Middle ear problems Past Medical History Notes: seasonal allergies Hematologic/Lymphatic Medical History: Positive for: Anemia - iron deficiency; Lymphedema Negative for: Hemophilia; Human Immunodeficiency Virus; Sickle Cell Disease Respiratory Medical History: Negative for: Aspiration; Asthma; Chronic  Obstructive Pulmonary Disease (COPD); Pneumothorax; Sleep Apnea; Tuberculosis Cardiovascular Medical History: Positive for: Congestive Heart Failure; Hypertension; Peripheral Venous Disease Negative for: Angina; Arrhythmia; Coronary Artery Disease; Deep Vein Thrombosis; Hypotension; Myocardial Infarction; Peripheral Arterial Disease; Phlebitis; Vasculitis Past Medical History Notes: aortic murmur, hyperlipidemia, hypertriglyceridemia Gastrointestinal Medical History: Negative for: Cirrhosis ; Colitis; Crohns; Hepatitis A; Hepatitis B; Hepatitis C Past Medical History Notes: UGI bleed, Endocrine Medical History: Negative for: Type I Diabetes; Type II Diabetes Past Medical History Notes: hypothyroidism [name], Makala H ([id number]) [id number] Page 10 of 11 Genitourinary Medical History: Negative for: End Stage Renal Disease Immunological Medical History: Negative for: Lupus Erythematosus; Raynauds; Scleroderma Integumentary (Skin) Medical History: Negative for: History of Burn Musculoskeletal Medical History: Positive for: Osteoarthritis  Negative for: Gout; Rheumatoid Arthritis; Osteomyelitis Neurologic Medical History: Positive for: Neuropathy - bila Negative for: Dementia; Quadriplegia; Paraplegia; Seizure Disorder Oncologic Medical History: Negative for: Received Chemotherapy; Received Radiation Psychiatric Medical History: Positive for: Confinement Anxiety Negative for: Anorexia/bulimia HBO Extended History Items Eyes: Cataracts Immunizations Pneumococcal Vaccine: Received Pneumococcal Vaccination: No Implantable Devices No devices added Hospitalization / Surgery History Type of Hospitalization/Surgery right arm surgery gastric bypass hysterectomy tonsillectomy hernia repair cervical myelopathy with spinal cord compression Family and Social History Cancer: Yes - Mother,Father; Diabetes: Yes - Father; Heart Disease: Yes -  Father; Hereditary Spherocytosis: No; Hypertension: Yes - Father; Kidney Disease: No; Lung Disease: No; Seizures: No; Stroke: Yes - Mother; Thyroid Problems: No; Tuberculosis: No; Never smoker; Marital Status - Married; Alcohol Use: Daily - 1.5 bottles of wine daily; Drug Use: No History; Caffeine Use: Daily - coffee; Financial Concerns: No; Food, Clothing or Shelter Needs: No; Support System Lacking: No; Transportation Concerns: No Electronic Signature(s) Signed: 01/26/2023 2:08:45 PM By: Fredirick Maudlin MD FACS Entered By: Fredirick Maudlin on 01/26/2023 14:02:12 -------------------------------------------------------------------------------- SuperBill Details Patient Name: Date of Service: Willeen [name], [place]. 01/26/2023 Medical Record Number: [id number] Patient Account Number: [id number] SHERISE, RICKERD ([id number]) [id number] Page 11 of 11 Date of Birth/Sex: Treating RN: 04-14-1946 (77 y.o. F) Primary Care Provider: Lynnda Shields, [place] [facility] N Other Clinician: Referring Provider: Treating Provider/Extender: Sheryle Spray, [place] [facility] Weeks in Treatment: 16 Diagnosis Coding ICD-10 Codes Code Description [id number] Non-pressure chronic ulcer of right ankle with fat layer exposed I87.2 Venous insufficiency (chronic) (peripheral) 99991111 Chronic diastolic (congestive) heart failure E66.09 Other obesity due to excess calories Facility Procedures : CPT4 Code: [id number] Description: (Facility Use Only) Apligraf 1 SQ CM ICD-10 Diagnosis Description L97.312 Non-pressure chronic ulcer of right ankle with fat layer exposed I87.2 Venous insufficiency (chronic) (peripheral) Modifier: Quantity: 44 : CPT4 Code: [id number] Description: [id number] - SKIN SUB GRAFT TRNK/ARM/LEG ICD-10 Diagnosis Description I87.2 Venous insufficiency (chronic) (peripheral) L97.312 Non-pressure chronic ulcer of right ankle with fat layer exposed Modifier: Quantity: 1 Physician Procedures : CPT4  Code Description Modifier [id number] - WC PHYS LEVEL 3 - EST PT 25 ICD-10 Diagnosis Description L97.312 Non-pressure chronic ulcer of right ankle with fat layer exposed I87.2 Venous insufficiency (chronic) (peripheral) 99991111 Chronic diastolic  (congestive) heart failure E66.09 Other obesity due to excess calories Quantity: 1 : [id number] - WC PHYS SKIN SUB GRAFT TRNK/ARM/LEG ICD-10 Diagnosis Description I87.2 Venous insufficiency (chronic) (peripheral) L97.312 Non-pressure chronic ulcer of right ankle with fat layer exposed Quantity: 1 Electronic Signature(s) Signed: 02/19/2023 9:55:08 AM By: Lonell Face Signed: 02/19/2023 5:13:01 PM By: Fredirick Maudlin MD FACS Previous Signature: 01/26/2023 2:07:53 PM Version By: Fredirick Maudlin MD FACS Previous Signature: 01/26/2023 2:06:50 PM Version By: Fredirick Maudlin MD FACS Entered By: Lonell Face on 02/19/2023 09:55:08

## 2023-01-31 ENCOUNTER — Other Ambulatory Visit: Payer: Self-pay | Admitting: Sports Medicine

## 2023-01-31 DIAGNOSIS — M170 Bilateral primary osteoarthritis of knee: Secondary | ICD-10-CM

## 2023-02-02 ENCOUNTER — Encounter (HOSPITAL_BASED_OUTPATIENT_CLINIC_OR_DEPARTMENT_OTHER): Payer: PPO | Admitting: General Surgery

## 2023-02-02 DIAGNOSIS — L97312 Non-pressure chronic ulcer of right ankle with fat layer exposed: Secondary | ICD-10-CM | POA: Diagnosis not present

## 2023-02-02 NOTE — Progress Notes (Signed)
AMYJO, ZULOAGA ([id number]) [id number] Page 1 of 7 Visit Report for 01/26/2023 Arrival Information Details Patient Name: Date of Service: [name], [name] 01/26/2023 1:15 PM Medical Record Number: [id number] Patient Account Number: [id number] Date of Birth/Sex: Treating RN: [birth year] (77 y.o. F) Zochol, [name] Primary Care Audrianna Driskill: Milus Mallick, MA RITZA Other Clinician: Referring Maris Bena: Treating Danyelle Brookover/Extender: Terese Door, MA RITZA Weeks in Treatment: 16 Visit Information History Since Last Visit Added or deleted any medications: No Patient Arrived: Walker Any new allergies or adverse reactions: No Arrival Time: 13:19 Had a fall or experienced change in No Accompanied By: husband activities of daily living that may affect Transfer Assistance: Manual risk of falls: Patient Identification Verified: Yes Signs or symptoms of abuse/neglect since last visito No Secondary Verification Process Completed: Yes Hospitalized since last visit: No Patient Requires Transmission-Based Precautions: No Implantable device outside of the clinic excluding No Patient Has Alerts: No cellular tissue based products placed in the center since last visit: Has Dressing in Place as Prescribed: Yes Pain Present Now: No Electronic Signature(s) Signed: 01/26/2023 3:49:48 PM By: Blanche East RN Entered By: Blanche East on 01/26/2023 15:49:48 -------------------------------------------------------------------------------- Compression Therapy Details Patient Name: Date of Service: [name], [name]. 01/26/2023 1:15 PM Medical Record Number: [id number] Patient Account Number: [id number] Date of Birth/Sex: Treating RN: 06-Mar-1946 (77 y.o. [name], [name] Primary Care Kellan Raffield: Milus Mallick, MA RITZA Other Clinician: Referring Alliah Boulanger: Treating Malijah Lietz/Extender: Terese Door, MA RITZA Weeks in Treatment: 16 Compression Therapy Performed for Wound  Assessment: Wound #6 Right,Lateral Ankle Performed By: Clinician Blanche East, RN Compression Type: Three Layer Post Procedure Diagnosis Same as Pre-procedure Electronic Signature(s) Signed: 01/26/2023 4:09:38 PM By: Blanche East RN Entered By: Blanche East on 01/26/2023 13:47:31 -------------------------------------------------------------------------------- Encounter Discharge Information Details Patient Name: Date of Service: [name], [name]. 01/26/2023 1:15 PM Medical Record Number: [id number] Patient Account Number: [id number] Date of Birth/Sex: Treating RN: [birth year] (77 y.o. F) Zochol, [name] Primary Care Marlynn Hinckley: Milus Mallick, MA RITZA Other Clinician: Referring Maanya Hippert: Treating Ariane Ditullio/Extender: Terese Door, MA RITZA Weeks in Treatment: 16 Encounter Discharge Information Items Post Procedure Vitals Discharge Condition: Stable Temperature (F): 97.8 Ambulatory Status: Ambulatory Pulse (bpm): 71 Discharge Destination: Home Respiratory Rate (breaths/min): 18 Transportation: Private Auto Blood Pressure (mmHg): 135/81 Accompanied By: spouse [name], [name] ([id number]) [id number] Page 2 of 7 Schedule Follow-up Appointment: No Clinical Summary of Care: Electronic Signature(s) Signed: 01/26/2023 2:03:22 PM By: Blanche East RN Entered By: Blanche East on 01/26/2023 14:03:21 -------------------------------------------------------------------------------- Lower Extremity Assessment Details Patient Name: Date of Service: [name], [name] 01/26/2023 1:15 PM Medical Record Number: [id number] Patient Account Number: [id number] Date of Birth/Sex: Treating RN: [birth year] (77 y.o. Harlow Ohms Primary Care Geoffrey Hynes: Milus Mallick, MA RITZA Other Clinician: Referring Graceson Nichelson: Treating Tamberly Pomplun/Extender: Terese Door, MA RITZA Weeks in Treatment: 16 Edema Assessment Assessed: [Left: No] [Right: No] Edema: [Left:  Ye] [Right: s] Calf Left: Right: Point of Measurement: From Medial Instep 37 cm Ankle Left: Right: Point of Measurement: From Medial Instep 21 cm Vascular Assessment Pulses: Dorsalis Pedis Palpable: [Right:Yes] Electronic Signature(s) Signed: 01/26/2023 3:24:46 PM By: Adline Peals Entered By: Adline Peals on 01/26/2023 13:36:37 -------------------------------------------------------------------------------- Multi Wound Chart Details Patient Name: Date of Service: [name], [name]. 01/26/2023 1:15 PM Medical Record Number: [id number] Patient Account Number: [id number] Date of Birth/Sex: Treating RN: 15-Dec-1945 (77 y.o. F) Primary Care Chantry Headen: Milus Mallick, MA [place] Other Clinician: Referring Makailey Hodgkin:  Treating Gen Clagg/Extender: Terese Door, MA RITZA Weeks in Treatment: 16 Vital Signs Height(in): 65 Pulse(bpm): 71 Weight(lbs): 229 Blood Pressure(mmHg): 135/81 Body Mass Index(BMI): 38.1 Temperature(F): 97.8 Respiratory Rate(breaths/min): 18 [6:Photos:] [N/A:N/A] Right, Lateral Ankle N/A N/A Wound Location: Gradually Appeared N/A N/A Wounding Event: Venous Leg Ulcer N/A N/A Primary Etiology: Cataracts, Anemia, Lymphedema, N/A N/A Comorbid History: Congestive Heart Failure, Hypertension, Peripheral Venous Disease, Osteoarthritis, Neuropathy, Confinement Anxiety 09/25/2022 N/A N/A Date Acquired: 16 N/A N/A Weeks of Treatment: Open N/A N/A Wound Status: No N/A N/A Wound Recurrence: 1x0.8x0.2 N/A N/A Measurements L x W x D (cm) 0.628 N/A N/A A (cm) : rea 0.126 N/A N/A Volume (cm) : 39.40% N/A N/A % Reduction in A rea: -21.20% N/A N/A % Reduction in Volume: Full Thickness Without Exposed N/A N/A Classification: Support Structures Medium N/A N/A Exudate A mount: Serous N/A N/A Exudate Type: amber N/A N/A Exudate Color: Flat and Intact N/A N/A Wound Margin: Large (67-100%) N/A N/A Granulation A mount: Red, Pink N/A  N/A Granulation Quality: Small (1-33%) N/A N/A Necrotic A mount: Fat Layer (Subcutaneous Tissue): Yes N/A N/A Exposed Structures: Fascia: No Tendon: No Muscle: No Joint: No Bone: No Small (1-33%) N/A N/A Epithelialization: Debridement - Selective/Open Wound N/A N/A Debridement: Pre-procedure Verification/Time Out 13:44 N/A N/A Taken: Lidocaine 4% Topical Solution N/A N/A Pain Control: Necrotic/Eschar, Slough N/A N/A Tissue Debrided: Non-Viable Tissue N/A N/A Level: 0.8 N/A N/A Debridement A (sq cm): rea Curette N/A N/A Instrument: Minimum N/A N/A Bleeding: Pressure N/A N/A Hemostasis A chieved: Procedure was tolerated well N/A N/A Debridement Treatment Response: 1x0.8x0.1 N/A N/A Post Debridement Measurements L x W x D (cm) 0.063 N/A N/A Post Debridement Volume: (cm) No Abnormalities Noted N/A N/A Periwound Skin Texture: Maceration: No N/A N/A Periwound Skin Moisture: Dry/Scaly: No Hemosiderin Staining: Yes N/A N/A Periwound Skin Color: Erythema: No No Abnormality N/A N/A Temperature: Yes N/A N/A Tenderness on Palpation: Cellular or Tissue Based Product N/A N/A Procedures Performed: Compression Therapy Debridement Treatment Notes Electronic Signature(s) Signed: 01/26/2023 2:00:03 PM By: Fredirick Maudlin MD FACS Entered By: Fredirick Maudlin on 01/26/2023 14:00:03 -------------------------------------------------------------------------------- Multi-Disciplinary Care Plan Details Patient Name: Date of Service: [name], [name]. 01/26/2023 1:15 PM Medical Record Number: [id number] Patient Account Number: [id number] Date of Birth/Sex: Treating RN: 04-Jul-1946 (77 y.o. Harlow Ohms Primary Care Kiearra Oyervides: Milus Mallick, MA RITZA Other Clinician: Referring Summit Arroyave: Treating Cherisa Brucker/Extender: Terese Door, MA RITZA Weeks in Treatment: [address] [place], Stepahnie [name] ([id number]) [phone].pdf Page 4 of  7 Nutrition Nursing Diagnoses: Potential for alteratiion in Nutrition/Potential for imbalanced nutrition Goals: Patient/caregiver agrees to and verbalizes understanding of need to use nutritional supplements and/or vitamins as prescribed Date Initiated: 10/13/2022 Target Resolution Date: 02/25/2023 Goal Status: Active Interventions: Provide education on nutrition Treatment Activities: Dietary management education, guidance and counseling : 10/13/2022 Education provided on Nutrition : 10/25/2022 Notes: Wound/Skin Impairment Nursing Diagnoses: Impaired tissue integrity Goals: Ulcer/skin breakdown will have a volume reduction of 30% by week 4 Date Initiated: 10/13/2022 Target Resolution Date: 02/25/2023 Goal Status: Active Interventions: Provide education on ulcer and skin care Notes: Electronic Signature(s) Signed: 01/26/2023 3:24:46 PM By: Adline Peals Signed: 01/26/2023 4:09:38 PM By: Blanche East RN Entered By: Blanche East on 01/26/2023 13:53:31 -------------------------------------------------------------------------------- Pain Assessment Details Patient Name: Date of Service: [name], [name]. 01/26/2023 1:15 PM Medical Record Number: [id number] Patient Account Number: [id number] Date of Birth/Sex: Treating RN: [birth year] (77 y.o. F) Primary Care Deovion Batrez: A BO NZA, MA RITZA Other  Clinician: Referring Ronesha Heenan: Treating Larrell Rapozo/Extender: Terese Door, MA RITZA Weeks in Treatment: 16 Active Problems Location of Pain Severity and Description of Pain Patient Has Paino No Site Locations Pain Management and Medication Current Pain ManagementTACORIA, [name] ([id number]) [phone].pdf Page 5 of 7 Electronic Signature(s) Signed: 01/31/2023 3:26:38 PM By: Sandre Kitty Entered By: Sandre Kitty on 01/26/2023 13:21:58 -------------------------------------------------------------------------------- Patient/Caregiver  Education Details Patient Name: Date of Service: [name], [name] 3/1/2024andnbsp1:15 PM Medical Record Number: [id number] Patient Account Number: [id number] Date of Birth/Gender: Treating RN: 1946/01/27 (77 y.o. Harlow Ohms Primary Care Physician: Milus Mallick, MA RITZA Other Clinician: Referring Physician: Treating Physician/Extender: Terese Door, MA RITZA Weeks in Treatment: 16 Education Assessment Education Provided To: Patient Education Topics Provided Wound/Skin Impairment: Methods: Explain/Verbal Responses: Reinforcements needed, State content correctly Electronic Signature(s) Signed: 01/26/2023 3:24:46 PM By: Adline Peals Entered By: Adline Peals on 01/26/2023 13:38:30 -------------------------------------------------------------------------------- Wound Assessment Details Patient Name: Date of Service: [name], [name] 01/26/2023 1:15 PM Medical Record Number: [id number] Patient Account Number: [id number] Date of Birth/Sex: Treating RN: 1946/06/19 (77 y.o. F) Primary Care Markeith Jue: Milus Mallick, MA RITZA Other Clinician: Referring Carolann Brazell: Treating Jermari Tamargo/Extender: Terese Door, MA RITZA Weeks in Treatment: 16 Wound Status Wound Number: 6 Primary Venous Leg Ulcer Etiology: Wound Location: Right, Lateral Ankle Wound Open Wounding Event: Gradually Appeared Status: Date Acquired: 09/25/2022 Comorbid Cataracts, Anemia, Lymphedema, Congestive Heart Failure, Weeks Of Treatment: 16 History: Hypertension, Peripheral Venous Disease, Osteoarthritis, Clustered Wound: No Neuropathy, Confinement Anxiety Photos Wound Measurements Length: (cm) 1 Width: (cm) 0.8 [place], [name] ([id number]) Depth: (cm) 0.2 Area: (cm) 0.628 Volume: (cm) 0.126 % Reduction in Area: 39.4% % Reduction in Volume: -21.2% [id number] Page 6 of 7 Epithelialization: Small (1-33%) Tunneling: No Undermining: No Wound  Description Classification: Full Thickness Without Exposed Support Structures Wound Margin: Flat and Intact Exudate Amount: Medium Exudate Type: Serous Exudate Color: amber Foul Odor After Cleansing: No Slough/Fibrino Yes Wound Bed Granulation Amount: Large (67-100%) Exposed Structure Granulation Quality: Red, Pink Fascia Exposed: No Necrotic Amount: Small (1-33%) Fat Layer (Subcutaneous Tissue) Exposed: Yes Necrotic Quality: Adherent Slough Tendon Exposed: No Muscle Exposed: No Joint Exposed: No Bone Exposed: No Periwound Skin Texture Texture Color No Abnormalities Noted: Yes No Abnormalities Noted: No Erythema: No Moisture Hemosiderin Staining: Yes No Abnormalities Noted: No Dry / Scaly: No Temperature / Pain Maceration: No Temperature: No Abnormality Tenderness on Palpation: Yes Treatment Notes Wound #6 (Ankle) Wound Laterality: Right, Lateral Cleanser Soap and Water Discharge Instruction: May shower and wash wound with dial antibacterial soap and water prior to dressing change. Peri-Wound Care Zinc Oxide Ointment 30g tube Discharge Instruction: Apply Zinc Oxide to periwound with each dressing change Sween Lotion (Moisturizing lotion) Discharge Instruction: Apply moisturizing lotion as directed Topical Primary Dressing APLIGRAF Secondary Dressing Woven Gauze Sponge, Non-Sterile 4x4 in Discharge Instruction: Apply over primary dressing as directed. Secured With Transpore Surgical Tape, 2x10 (in/yd) Discharge Instruction: Secure dressing with tape as directed. Compression Wrap ThreePress (3 layer compression wrap) Discharge Instruction: Apply three layer compression as directed. Compression Stockings Add-Ons Electronic Signature(s) Signed: 01/26/2023 3:24:46 PM By: Adline Peals Entered By: Adline Peals on 01/26/2023 13:38:07 -------------------------------------------------------------------------------- Vitals Details Patient Name: Date of  Service: [name], Reynolds. 01/26/2023 1:15 PM [name] ([id number].pdf Page 7 of 7 Medical Record Number: [id number] Patient Account Number: [id number] Date of Birth/Sex: Treating RN: 1946/10/25 (77 y.o. F) Primary Care Rivky Clendenning: A Georges Lynch, MA RITZA Other Clinician:  Referring Khyran Riera: Treating Vernita Tague/Extender: Terese Door, MA RITZA Weeks in Treatment: 16 Vital Signs Time Taken: 13:21 Temperature (F): 97.8 Height (in): 65 Pulse (bpm): 71 Weight (lbs): 229 Respiratory Rate (breaths/min): 18 Body Mass Index (BMI): 38.1 Blood Pressure (mmHg): 135/81 Reference Range: 80 - 120 mg / dl Electronic Signature(s) Signed: 01/31/2023 3:26:38 PM By: Sandre Kitty Entered By: Sandre Kitty on 01/26/2023 13:21:53

## 2023-02-04 NOTE — Progress Notes (Signed)
DEDEE, SELLIN ([id number]) [id number] Page 1 of 4 Visit Report for 02/02/2023 Arrival Information Details Patient Name: Date of Service: [name], [name] 02/02/2023 11:15 A M Medical Record Number: [id number] Patient Account Number: [id number] Date of Birth/Sex: Treating RN: 1946-04-10 (77 y.o. F) Zochol, [name] Primary Care Vandella Ord: Milus Mallick, MA RITZA Other Clinician: Referring Krystyn Picking: Treating Aarilyn Dye/Extender: Terese Door, MA RITZA Weeks in Treatment: 2 Visit Information History Since Last Visit Added or deleted any medications: No Patient Arrived: Cane Any new allergies or adverse reactions: No Arrival Time: 11:19 Had a fall or experienced change in No Accompanied By: husband activities of daily living that may affect Transfer Assistance: None risk of falls: Patient Identification Verified: Yes Signs or symptoms of abuse/neglect since last visito No Secondary Verification Process Completed: Yes Hospitalized since last visit: No Patient Requires Transmission-Based Precautions: No Implantable device outside of the clinic excluding No Patient Has Alerts: No cellular tissue based products placed in the center since last visit: Has Compression in Place as Prescribed: Yes Pain Present Now: Yes Electronic Signature(s) Signed: 02/02/2023 4:25:11 PM By: Blanche East RN Entered By: Blanche East on 02/02/2023 11:20:09 -------------------------------------------------------------------------------- Compression Therapy Details Patient Name: Date of Service: [name], [name] Balo H. 02/02/2023 11:15 A M Medical Record Number: [id number] Patient Account Number: [id number] Date of Birth/Sex: Treating RN: August 10, 1946 (77 y.o. [name], [name] Primary Care Evelyn Moch: Milus Mallick, MA RITZA Other Clinician: Referring Sheri Gatchel: Treating Verbie Babic/Extender: Terese Door, MA RITZA Weeks in Treatment: 17 Compression Therapy Performed for  Wound Assessment: Wound #6 Right,Lateral Ankle Performed By: Clinician Blanche East, RN Compression Type: Three Layer Electronic Signature(s) Signed: 02/02/2023 4:25:11 PM By: Blanche East RN Entered By: Blanche East on 02/02/2023 11:20:48 -------------------------------------------------------------------------------- Encounter Discharge Information Details Patient Name: Date of Service: [name], [name] Balo H. 02/02/2023 11:15 A M Medical Record Number: [id number] Patient Account Number: [id number] Date of Birth/Sex: Treating RN: 03/16/1946 (77 y.o. [name] Primary Care Dani Wallner: Milus Mallick, MA RITZA Other Clinician: Referring Ludmilla Mcgillis: Treating Crespin Forstrom/Extender: Terese Door, MA RITZA Weeks in Treatment: 17 Encounter Discharge Information Items Discharge Condition: Stable Ambulatory Status: Cane Discharge Destination: Home Transportation: Private Auto Accompanied By: husband Schedule Follow-up Appointment: Yes Clinical Summary of CareVITA, GLECKLER ([id number]) [id number] Page 2 of 4 Electronic Signature(s) Signed: 02/02/2023 4:25:11 PM By: Blanche East RN Entered By: Blanche East on 02/02/2023 11:21:57 -------------------------------------------------------------------------------- Patient/Caregiver Education Details Patient Name: Date of Service: [name], [name] 3/8/2024andnbsp11:15 A M Medical Record Number: [id number] Patient Account Number: [id number] Date of Birth/Gender: Treating RN: May 15, 1946 (77 y.o. [name] Primary Care Physician: Milus Mallick, MA RITZA Other Clinician: Referring Physician: Treating Physician/Extender: Terese Door, MA RITZA Weeks in Treatment: 17 Education Assessment Education Provided To: Patient Education Topics Provided Wound/Skin Impairment: Methods: Explain/Verbal Responses: Reinforcements needed, State content correctly Electronic Signature(s) Signed: 02/02/2023  4:25:11 PM By: Blanche East RN Entered By: Blanche East on 02/02/2023 11:21:40 -------------------------------------------------------------------------------- Wound Assessment Details Patient Name: Date of Service: [name], [name] 02/02/2023 11:15 A M Medical Record Number: [id number] Patient Account Number: [id number] Date of Birth/Sex: Treating RN: 03-26-1946 (77 y.o. F) Zochol, [name] Primary Care Hiilei Gerst: Milus Mallick, MA RITZA Other Clinician: Referring Adell Koval: Treating Andren Bethea/Extender: Terese Door, MA RITZA Weeks in Treatment: 17 Wound Status Wound Number: 6 Primary Venous Leg Ulcer Etiology: Wound Location: Right, Lateral Ankle Wound Open Wounding Event: Gradually Appeared Status: Date Acquired: 09/25/2022 Comorbid Cataracts,  Anemia, Lymphedema, Congestive Heart Failure, Weeks Of Treatment: 17 History: Hypertension, Peripheral Venous Disease, Osteoarthritis, Clustered Wound: No Neuropathy, Confinement Anxiety Wound Measurements Length: (cm) 1 Width: (cm) 0.8 Depth: (cm) 0.2 Area: (cm) 0.628 Volume: (cm) 0.126 % Reduction in Area: 39.4% % Reduction in Volume: -21.2% Epithelialization: Small (1-33%) Tunneling: No Undermining: No Wound Description Classification: Full Thickness Without Exposed Support Wound Margin: Flat and Intact Exudate Amount: Medium Exudate Type: Serous Exudate Color: amber Structures Foul Odor After Cleansing: No Slough/Fibrino Yes Wound Bed Granulation Amount: Large (67-100%) Exposed Structure Granulation Quality: Red, Pink Fascia Exposed: No Necrotic Amount: Small (1-33%) Fat Layer (Subcutaneous Tissue) Exposed: Yes Necrotic Quality: Adherent Slough Tendon Exposed: No Muscle Exposed: No Bones, Dustina H ([id number].pdf Page 3 of 4 Joint Exposed: No Bone Exposed: No Periwound Skin Texture Texture Color No Abnormalities Noted: Yes No Abnormalities Noted: No Erythema:  No Moisture Hemosiderin Staining: Yes No Abnormalities Noted: No Dry / Scaly: No Temperature / Pain Maceration: No Temperature: No Abnormality Tenderness on Palpation: Yes Treatment Notes Wound #6 (Ankle) Wound Laterality: Right, Lateral Cleanser Soap and Water Discharge Instruction: May shower and wash wound with dial antibacterial soap and water prior to dressing change. Peri-Wound Care Zinc Oxide Ointment 30g tube Discharge Instruction: Apply Zinc Oxide to periwound with each dressing change Sween Lotion (Moisturizing lotion) Discharge Instruction: Apply moisturizing lotion as directed Topical Primary Dressing APLIGRAF Secondary Dressing Woven Gauze Sponge, Non-Sterile 4x4 in Discharge Instruction: Apply over primary dressing as directed. Secured With Transpore Surgical Tape, 2x10 (in/yd) Discharge Instruction: Secure dressing with tape as directed. Compression Wrap ThreePress (3 layer compression wrap) Discharge Instruction: Apply three layer compression as directed. Compression Stockings Add-Ons Electronic Signature(s) Signed: 02/02/2023 4:25:11 PM By: Blanche East RN Entered By: Blanche East on 02/02/2023 11:20:32 -------------------------------------------------------------------------------- Vitals Details Patient Name: Date of Service: [name], Lizvette H. 02/02/2023 11:15 A M Medical Record Number: [id number] Patient Account Number: [id number] Date of Birth/Sex: Treating RN: 13-Dec-1945 (77 y.o. F) Zochol, [name] Primary Care Julia Kulzer: Milus Mallick, MA RITZA Other Clinician: Referring Alasia Enge: Treating Lamona Eimer/Extender: Terese Door, MA RITZA Weeks in Treatment: 17 Vital Signs Time Taken: 11:20 Reference Range: 80 - 120 mg / dl Height (in): 65 Weight (lbs): 229 Body Mass Index (BMI): 38.1 Electronic Signature(s) Signed: 02/02/2023 4:25:11 PM By: Blanche East RN Entered By: Blanche East on 02/02/2023 11:20:18 [name] ([id number].pdf Page 4 of 4

## 2023-02-04 NOTE — Progress Notes (Signed)
MANVIR, BUAN ([id number]) [id number] Page 1 of 1 Visit Report for 02/02/2023 SuperBill Details Patient Name: Date of Service: [name], [name] 02/02/2023 Medical Record Number: [id number] Patient Account Number: [id number] Date of Birth/Sex: Treating RN: Apr 09, 1946 (77 y.o. [name], [name] Primary Care Provider: Milus Mallick, MA RITZA Other Clinician: Referring Provider: Treating Provider/Extender: Terese Door, MA RITZA Weeks in Treatment: 17 Diagnosis Coding ICD-10 Codes Code Description [id number] Non-pressure chronic ulcer of right ankle with fat layer exposed I87.2 Venous insufficiency (chronic) (peripheral) 99991111 Chronic diastolic (congestive) heart failure E66.09 Other obesity due to excess calories Facility Procedures CPT4 Code Description Modifier Quantity [id number] (Facility Use Only) [phone] - APPLY [place] LWR RT LEG 1 ICD-10 Diagnosis Description [id number] Non-pressure chronic ulcer of right ankle with fat layer exposed Electronic Signature(s) Signed: 02/02/2023 2:25:58 PM By: Fredirick Maudlin MD FACS Signed: 02/02/2023 4:25:11 PM By: Blanche East RN Entered By: Blanche East on 02/02/2023 11:32:14

## 2023-02-07 ENCOUNTER — Encounter: Payer: Self-pay | Admitting: Family Medicine

## 2023-02-07 ENCOUNTER — Ambulatory Visit (INDEPENDENT_AMBULATORY_CARE_PROVIDER_SITE_OTHER): Payer: PPO | Admitting: Family Medicine

## 2023-02-07 VITALS — BP 116/72 | HR 74 | Resp 18 | Ht 65.0 in | Wt 236.0 lb

## 2023-02-07 DIAGNOSIS — F10982 Alcohol use, unspecified with alcohol-induced sleep disorder: Secondary | ICD-10-CM

## 2023-02-07 DIAGNOSIS — G8929 Other chronic pain: Secondary | ICD-10-CM | POA: Diagnosis not present

## 2023-02-07 DIAGNOSIS — T1490XA Injury, unspecified, initial encounter: Secondary | ICD-10-CM

## 2023-02-07 DIAGNOSIS — E782 Mixed hyperlipidemia: Secondary | ICD-10-CM | POA: Diagnosis not present

## 2023-02-07 DIAGNOSIS — M549 Dorsalgia, unspecified: Secondary | ICD-10-CM | POA: Diagnosis not present

## 2023-02-07 DIAGNOSIS — E039 Hypothyroidism, unspecified: Secondary | ICD-10-CM | POA: Diagnosis not present

## 2023-02-07 DIAGNOSIS — I10 Essential (primary) hypertension: Secondary | ICD-10-CM | POA: Diagnosis not present

## 2023-02-07 MED ORDER — TRAZODONE HCL 50 MG PO TABS: 25.0000 mg | ORAL_TABLET | Freq: Every evening | ORAL | 0 refills | Status: DC | PRN

## 2023-02-07 MED ORDER — LEVOTHYROXINE SODIUM 125 MCG PO TABS: ORAL_TABLET | ORAL | 0 refills | Status: DC

## 2023-02-07 MED ORDER — CYCLOBENZAPRINE HCL 10 MG PO TABS: ORAL_TABLET | ORAL | 0 refills | Status: DC

## 2023-02-07 MED ORDER — MUPIROCIN 2 % EX KIT: 1.0000 "application " | PACK | Freq: Every day | CUTANEOUS | 1 refills | Status: DC | PRN

## 2023-02-07 MED ORDER — BENAZEPRIL-HYDROCHLOROTHIAZIDE 20-25 MG PO TABS: 1.0000 | ORAL_TABLET | Freq: Every day | ORAL | 1 refills | Status: DC

## 2023-02-07 MED ORDER — FUROSEMIDE 20 MG PO TABS: 20.0000 mg | ORAL_TABLET | ORAL | 3 refills | Status: DC | PRN

## 2023-02-07 MED ORDER — ROSUVASTATIN CALCIUM 5 MG PO TABS: 5.0000 mg | ORAL_TABLET | Freq: Every day | ORAL | 0 refills | Status: DC

## 2023-02-07 MED ORDER — CARVEDILOL 6.25 MG PO TABS: 6.2500 mg | ORAL_TABLET | Freq: Two times a day (BID) | ORAL | 1 refills | Status: DC

## 2023-02-07 NOTE — Progress Notes (Signed)
Established Patient Office Visit  Subjective   Patient ID: [name], female    DOB: 03-01-1946  Age: 77 y.o. MRN: [id number]  Chief Complaint  Patient presents with   Medical Management of Chronic Issues   Hypertension   Neurologic Problem        Hypothyroidism    HPI [name] is a 77 y.o. female presenting today for follow up of hypertension, thyroid, neuropathy. Hypertension: Patient here for follow-up of elevated blood pressure.  Pt denies chest pain, SOB, dizziness, edema, syncope, fatigue or heart palpitations. Taking benazepril-hydrochlorothiazide, carvedilol, Lasix, reports excellent compliance with treatment. Denies side effects. Hypothyroidism: Taking levothyroxine 125 mcg regularly in the AM away from food and vitamins. Denies fatigue, weight changes, heat/cold intolerance, skin/hair changes, bowel changes, CVS symptoms. Neuropathy: Currently has prescription for gabapentin 300 mg, taking total of 10 tablets daily (3000 mg total daily).  Neurology told her that she can take up to 11 total daily, but she finds that this causes too much diarrhea.  She is able to tolerate 10 a day which helps with her pain, though it has been worsening. She has been going to wound care for the past several weeks, but it is getting very expensive out-of-pocket so she is requesting a prescription of mupirocin so that she can do some wound care at home.  ROS Negative unless otherwise noted in HPI   Objective:     BP 116/72 (BP Location: Left Arm, Patient Position: Sitting, Cuff Size: Normal)   Pulse 74   Resp 18   Ht '5\' 5"'$  (1.651 m)   Wt 236 lb (107 kg)   SpO2 95%   BMI 39.27 kg/m   Physical Exam Constitutional:      General: She is not in acute distress.    Appearance: Normal appearance.  HENT:     Head: Normocephalic and atraumatic.  Eyes:     Extraocular Movements: Extraocular movements intact.     Conjunctiva/sclera: Conjunctivae normal.   Cardiovascular:     Rate and Rhythm: Normal rate and regular rhythm.     Pulses: Normal pulses.     Heart sounds: No murmur heard.    No friction rub. No gallop.  Pulmonary:     Effort: Pulmonary effort is normal. No respiratory distress.     Breath sounds: No wheezing, rhonchi or rales.  Musculoskeletal:     Cervical back: Normal range of motion.     Comments: Right leg is completely covered in bandages for wounds, cared for by wound care clinic.  Left leg has smaller bandage midway down the calf covering wound.  Skin:    General: Skin is warm and dry.  Neurological:     Mental Status: She is alert and oriented to person, place, and time.  Psychiatric:        Mood and Affect: Mood normal.     Assessment & Plan:  Essential hypertension Assessment & Plan: Stable, blood pressure at goal.  Continue benazepril-hydrochlorothiazide, carvedilol, furosemide.  Will continue to monitor.  Orders: -     Benazepril-hydroCHLOROthiazide; Take 1 tablet by mouth daily.  Dispense: 90 tablet; Refill: 1 -     Carvedilol; Take 1 tablet (6.25 mg total) by mouth 2 (two) times daily with a meal.  Dispense: 180 tablet; Refill: 1 -     Furosemide; Take 1 tablet (20 mg total) by mouth as needed. Take and additional if needed for fluid  Dispense: 90 tablet; Refill: 3  Hypothyroidism, unspecified type  Assessment & Plan: Last TSH within normal limits, 1.000.  Continue levothyroxine 125 mcg daily.  Patient does not have any symptoms that are concerning for any thyroid issues.  Will recheck thyroid labs at next visit and make medication adjustments at that time if indicated.  Orders: -     Levothyroxine Sodium; TAKE 1 TABLET (125 MCG TOTAL) BY MOUTH ONCE DAILY EVERY MORNING BEFORE BREAKFAST  Dispense: 30 tablet; Refill: 0  Chronic back pain, unspecified back location, unspecified back pain laterality -     Cyclobenzaprine HCl; TAKE 1 TABLET BY MOUTH DAILY AS NEEDED FOR MUSCLE SPASMS.  Dispense: 30 tablet;  Refill: 0  Mixed hyperlipidemia -     Rosuvastatin Calcium; Take 1 tablet (5 mg total) by mouth daily.  Dispense: 60 tablet; Refill: 0  Insomnia due to alcohol excess and stress -     traZODone HCl; Take 0.5-1 tablets (25-50 mg total) by mouth at bedtime as needed. for sleep  Dispense: 90 tablet; Refill: 0  Injury of skin and subcutaneous tissue -     Mupirocin; Apply 1 application  topically daily as needed (Wound).  Dispense: 1 kit; Refill: 1    Return in about 4 months (around 06/09/2023) for follow-up for hypertension, hyperlipidemia, hypothyroid, neuropathy.    [name] Harman, PA

## 2023-02-07 NOTE — Assessment & Plan Note (Signed)
Stable, blood pressure at goal.  Continue benazepril-hydrochlorothiazide, carvedilol, furosemide.  Will continue to monitor.

## 2023-02-07 NOTE — Assessment & Plan Note (Signed)
Last TSH within normal limits, 1.000.  Continue levothyroxine 125 mcg daily.  Patient does not have any symptoms that are concerning for any thyroid issues.  Will recheck thyroid labs at next visit and make medication adjustments at that time if indicated.

## 2023-02-09 ENCOUNTER — Encounter (HOSPITAL_BASED_OUTPATIENT_CLINIC_OR_DEPARTMENT_OTHER): Payer: PPO | Attending: General Surgery | Admitting: Internal Medicine

## 2023-02-09 DIAGNOSIS — I110 Hypertensive heart disease with heart failure: Secondary | ICD-10-CM | POA: Insufficient documentation

## 2023-02-09 DIAGNOSIS — I872 Venous insufficiency (chronic) (peripheral): Secondary | ICD-10-CM | POA: Diagnosis not present

## 2023-02-09 DIAGNOSIS — E6609 Other obesity due to excess calories: Secondary | ICD-10-CM | POA: Insufficient documentation

## 2023-02-09 DIAGNOSIS — L97312 Non-pressure chronic ulcer of right ankle with fat layer exposed: Secondary | ICD-10-CM | POA: Diagnosis not present

## 2023-02-09 DIAGNOSIS — M1990 Unspecified osteoarthritis, unspecified site: Secondary | ICD-10-CM | POA: Diagnosis not present

## 2023-02-09 DIAGNOSIS — Z6838 Body mass index (BMI) 38.0-38.9, adult: Secondary | ICD-10-CM | POA: Insufficient documentation

## 2023-02-09 DIAGNOSIS — I5032 Chronic diastolic (congestive) heart failure: Secondary | ICD-10-CM | POA: Insufficient documentation

## 2023-02-13 NOTE — Progress Notes (Signed)
SHAWNI, LACHMAN ([id number]) [id number] Page 1 of 1 Visit Report for 02/09/2023 SuperBill Details Patient Name: Date of Service: [name], [name] 02/09/2023 Medical Record Number: [id number] Patient Account Number: [id number] Date of Birth/Sex: Treating RN: 1946-03-27 (77 y.o. [name], [name] Primary Care Provider: Lynnda [name], [place] [facility] N Other Clinician: Referring Provider: Treating Provider/Extender: [name], [place] [facility] N Weeks in Treatment: 18 Diagnosis Coding ICD-10 Codes Code Description [id number] Non-pressure chronic ulcer of right ankle with fat layer exposed I87.2 Venous insufficiency (chronic) (peripheral) 99991111 Chronic diastolic (congestive) heart failure E66.09 Other obesity due to excess calories Facility Procedures CPT4 Code Description Modifier Quantity [id number] (Facility Use Only) [phone] - APPLY [place] L[facility] RT LEG 1 ICD-10 Diagnosis Description [id number] Non-pressure chronic ulcer of right ankle with fat layer exposed Electronic Signature(s) Signed: 02/09/2023 3:54:15 PM By: Kalman Shan DO Signed: 02/12/2023 5:06:20 PM By: Blanche East RN Entered By: Blanche East on 02/09/2023 15:44:22

## 2023-02-13 NOTE — Progress Notes (Signed)
KHYIA, AFIFI ([id number]) [id number] Page 1 of 4 Visit Report for 02/09/2023 Arrival Information Details Patient Name: Date of Service: [name], [name] 02/09/2023 12:30 PM Medical Record Number: [id number] Patient Account Number: [id number] Date of Birth/Sex: Treating RN: January 04, 1946 (77 y.o. F) Zochol, [name] Primary Care Wretha Laris: Lynnda Shields, [place] [facility] N Other Clinician: Referring Paiden Cavell: Treating Gabriell Daigneault/Extender: Charlyne Quale, [place] [facility] Weeks in Treatment: 18 Visit Information History Since Last Visit Added or deleted any medications: No Patient Arrived: Cane Any new allergies or adverse reactions: No Arrival Time: 12:33 Had a fall or experienced change in No Accompanied By: husband activities of daily living that may affect Transfer Assistance: None risk of falls: Patient Identification Verified: Yes Signs or symptoms of abuse/neglect since last visito No Secondary Verification Process Completed: Yes Hospitalized since last visit: No Patient Requires Transmission-Based Precautions: No Implantable device outside of the clinic excluding No Patient Has Alerts: No cellular tissue based products placed in the center since last visit: Has Compression in Place as Prescribed: Yes Pain Present Now: Yes Electronic Signature(s) Signed: 02/12/2023 5:06:20 PM By: Blanche East RN Entered By: Blanche East on 02/09/2023 12:35:29 -------------------------------------------------------------------------------- Compression Therapy Details Patient Name: Date of Service: [name], [name] 02/09/2023 12:30 PM Medical Record Number: [id number] Patient Account Number: [id number] Date of Birth/Sex: Treating RN: 1946/02/28 (77 y.o. [name] Primary Care Leondro Coryell: Lynnda Shield[place], [place] [facility] N Other Clinician: Referring Faun Mcqueen: Treating Karolee Meloni/Extender: Charlyne [place]e, [place] [facility] N Weeks in Treatment: 18 Compression Therapy Performed for  Wound Assessment: Wound #6 Right,Lateral Ankle Performed By: Clinician Blanche East, RN Compression Type: Three Layer Electronic Signature(s) Signed: 02/12/2023 5:06:20 PM By: Blanche East RN Entered By: Blanche East on 02/09/2023 15:43:34 -------------------------------------------------------------------------------- Encounter Discharge Information Details Patient Name: Date of Service: [name], [place] H. 02/09/2023 12:30 PM Medical Record Number: [id number] Patient Account Number: [id number] Date of Birth/Sex: Treating RN: May 15, 1946 (77 y.o. [name] Primary Care Bryanna Yim: Lynnda Shield[place], [place] [facility] N Other Clinician: Referring Zilpha Mcandrew: Treating Tiamarie Furnari/Extender: Charlyne Quale, [place] [facility] N Weeks in Treatment: 70 Encounter Discharge Information Items Discharge Condition: Stable Ambulatory Status: Cane Discharge Destination: Home Transportation: Private Auto Accompanied By: self Schedule Follow-up Appointment: Yes Clinical Summary of Care: [name], [name] ([id number]) [id number] Page 2 of 4 Electronic Signature(s) Signed: 02/12/2023 5:06:20 PM By: Blanche East RN Entered By: Blanche East on 02/09/2023 15:44:09 -------------------------------------------------------------------------------- Patient/Caregiver Education Details Patient Name: Date of Service: [name], [name] 3/15/2024andnbsp12:30 PM Medical Record Number: [id number] Patient Account Number: [id number] Date of Birth/Gender: Treating RN: 1946-02-28 (77 y.o. [name] Primary Care Physician: Lynnda Shield[place], [place] [facility] N Other Clinician: Referring Physician: Treating Physician/Extender: [place]arlyne [place], [place] [facility] N Weeks in Treatment: 59 Education Assessment Education Provided To: Patient Education Topics Provided Wound/Skin Impairment: Methods: Explain/Verbal Responses: Reinforcements needed, State content correctly Electronic Signature(s) Signed: 02/12/2023  5:06:20 PM By: Blanche East RN Entered By: Blanche East on 02/09/2023 15:43:57 -------------------------------------------------------------------------------- Wound Assessment Details Patient Name: Date of Service: [name], [name] H. 02/09/2023 12:30 PM Medical Record Number: [id number] Patient Account Number: [id number] Date of Birth/Sex: Treating RN: 12-20-1945 (77 y.o. [name], [name] Primary Care Collette Pescador: Lynnda Shield[place], [place] [facility] N Other Clinician: Referring Samy Ryner: Treating Verlia Kaney/Extender: [place]arlyne [place], [place] [facility] N Weeks in Treatment: 18 Wound Status Wound Number: 6 Primary Venous Leg Ulcer Etiology: Wound Location: Right, Lateral Ankle Wound Open Wounding Event: Gradually Appeared Status: Date Acquired: 09/25/2022 Comorbid Cataracts, Anemia, Lymphedema, Congestive Heart Failure,  Weeks Of Treatment: 18 History: Hypertension, Peripheral Venous Disease, Osteoarthritis, Clustered Wound: No Neuropathy, Confinement Anxiety Wound Measurements Length: (cm) 1 Width: (cm) 0.8 Depth: (cm) 0.2 Area: (cm) 0.628 Volume: (cm) 0.126 % Reduction in Area: 39.4% % Reduction in Volume: -21.2% Epithelialization: Small (1-33%) Tunneling: No Undermining: No Wound Description Classification: Full Thickness Without Exposed Support Wound Margin: Flat and Intact Exudate Amount: Medium Exudate Type: Serous Exudate Color: amber Structures Foul Odor After Cleansing: No Slough/Fibrino Yes Wound Bed Granulation Amount: Large (67-100%) Exposed Structure Granulation Quality: Red, Pink Fascia Exposed: No Necrotic Amount: Small (1-33%) Fat Layer (Subcutaneous Tissue) Exposed: Yes Necrotic Quality: Adherent Slough Tendon Exposed: No Muscle Exposed: No Ricard, Gala H ([id number] Page 3 of 4 Joint Exposed: No Bone Exposed: No Periwound Skin Texture Texture Color No Abnormalities Noted: Yes No Abnormalities Noted: No Erythema:  No Moisture Hemosiderin Staining: Yes No Abnormalities Noted: No Dry / Scaly: No Temperature / Pain Maceration: No Temperature: No Abnormality Tenderness on Palpation: Yes Treatment Notes Wound #6 (Ankle) Wound Laterality: Right, Lateral Cleanser Soap and Water Discharge Instruction: May shower and wash wound with dial antibacterial soap and water prior to dressing change. Peri-Wound Care Zinc Oxide Ointment 30g tube Discharge Instruction: Apply Zinc Oxide to periwound with each dressing change Sween Lotion (Moisturizing lotion) Discharge Instruction: Apply moisturizing lotion as directed Topical Primary Dressing APLIGRAF Secondary Dressing Woven Gauze Sponge, Non-Sterile 4x4 in Discharge Instruction: Apply over primary dressing as directed. Secured With Transpore Surgical Tape, 2x10 (in/yd) Discharge Instruction: Secure dressing with tape as directed. Compression Wrap ThreePress (3 layer compression wrap) Discharge Instruction: Apply three layer compression as directed. Compression Stockings Add-Ons Electronic Signature(s) Signed: 02/12/2023 5:06:20 PM By: Blanche East RN Entered By: Blanche East on 02/09/2023 15:43:09 -------------------------------------------------------------------------------- Vitals Details Patient Name: Date of Service: [name], Elaynah H. 02/09/2023 12:30 PM Medical Record Number: [id number] Patient Account Number: [id number] Date of Birth/Sex: Treating RN: 19-Nov-1946 (77 y.o. F) Zochol, [name] Primary Care Lovis More: Lynnda Shield[place], [place] [facility] N Other Clinician: Referring Rendon Howell: Treating Taesean Reth/Extender: Charlyne Quale, [place] [facility] N Weeks in Treatment: 18 Vital Signs Time Taken: 12:34 Temperature (F): 98.1 Height (in): 65 Pulse (bpm): 87 Weight (lbs): 229 Respiratory Rate (breaths/min): 18 Body Mass Index (BMI): 38.1 Blood Pressure (mmHg): 161/81 Reference Range: 80 - 120 mg / dl Electronic Signature(s) Signed: 02/12/2023 5:06:20  PM By: Blanche East RN [name], Kiona H ([id number]) [id number] Page 4 of 4 Entered By: Blanche East on 02/09/2023 15:42:45

## 2023-02-15 ENCOUNTER — Encounter (HOSPITAL_BASED_OUTPATIENT_CLINIC_OR_DEPARTMENT_OTHER): Payer: PPO | Admitting: General Surgery

## 2023-02-15 DIAGNOSIS — I872 Venous insufficiency (chronic) (peripheral): Secondary | ICD-10-CM | POA: Diagnosis not present

## 2023-02-15 DIAGNOSIS — L97312 Non-pressure chronic ulcer of right ankle with fat layer exposed: Secondary | ICD-10-CM | POA: Diagnosis not present

## 2023-02-15 DIAGNOSIS — L97822 Non-pressure chronic ulcer of other part of left lower leg with fat layer exposed: Secondary | ICD-10-CM | POA: Diagnosis not present

## 2023-02-23 ENCOUNTER — Encounter (HOSPITAL_BASED_OUTPATIENT_CLINIC_OR_DEPARTMENT_OTHER): Payer: PPO | Admitting: General Surgery

## 2023-02-23 DIAGNOSIS — I872 Venous insufficiency (chronic) (peripheral): Secondary | ICD-10-CM | POA: Diagnosis not present

## 2023-02-23 DIAGNOSIS — L97312 Non-pressure chronic ulcer of right ankle with fat layer exposed: Secondary | ICD-10-CM | POA: Diagnosis not present

## 2023-02-23 DIAGNOSIS — L97822 Non-pressure chronic ulcer of other part of left lower leg with fat layer exposed: Secondary | ICD-10-CM | POA: Diagnosis not present

## 2023-02-24 NOTE — Progress Notes (Signed)
[name] ([id number]) [id number] Page 1 of 12 Visit Report for 02/23/2023 Chief Complaint Document Details Patient Name: Date of Service: [name], [name] 02/23/2023 12:30 PM Medical Record Number: [id number] Patient Account Number: [id number] Date of Birth/Sex: Treating RN: [birth year] (77 y.o. F) Primary Care Provider: Lynnda Shields, [name] [facility] N Other Clinician: Referring Provider: Treating Provider/Extender: Sheryle Spray, [place] RGA N Weeks in Treatment: 20 Information Obtained from: Patient Chief Complaint Bilateral LE Ulcers 10/05/2022: RLE ulcer Electronic Signature(s) Signed: 02/23/2023 1:09:32 PM By: Fredirick Maudlin MD FACS Entered By: Fredirick Maudlin on 02/23/2023 13:09:32 -------------------------------------------------------------------------------- Debridement Details Patient Name: Date of Service: [name], [name]. 02/23/2023 12:30 PM Medical Record Number: [id number] Patient Account Number: [id number] Date of Birth/Sex: Treating RN: [birth year] (77 y.o. Harlow Ohms Primary Care Provider: Lynnda Shields, [place] [facility] N Other Clinician: Referring Provider: Treating Provider/Extender: Sheryle Spray, [place] RGA N Weeks in Treatment: 20 Debridement Performed for Assessment: Wound #8 Left,Anterior Lower Leg Performed By: Physician Fredirick Maudlin, MD Debridement Type: Debridement Severity of Tissue Pre Debridement: Fat layer exposed Level of Consciousness (Pre-procedure): Awake and Alert Pre-procedure Verification/Time Out Yes - 12:53 Taken: Start Time: 12:53 Pain Control: Lidocaine 4% T opical Solution T Area Debrided (L x W): otal 2.9 (cm) x 1.1 (cm) = 3.19 (cm) Tissue and other material debrided: Non-Viable, Eschar Level: Non-Viable Tissue Debridement Description: Selective/Open Wound Instrument: Curette Bleeding: Minimum Hemostasis Achieved: Pressure Response to Treatment: Procedure was tolerated well Level  of Consciousness (Post- Awake and Alert procedure): Post Debridement Measurements of Total Wound Length: (cm) 2.9 Width: (cm) 1.1 Depth: (cm) 0.1 Volume: (cm) 0.251 Character of Wound/Ulcer Post Debridement: Improved Severity of Tissue Post Debridement: Fat layer exposed Post Procedure Diagnosis Same as Pre-procedure Notes scribed for Dr. Celine Ahr by Adline Peals, RN Electronic Signature(s) Signed: 02/23/2023 1:12:54 PM By: Fredirick Maudlin MD FACS Signed: 02/23/2023 4:05:27 PM By: Olevia Bowens, [name] ([id number]) [email or web address] Page 2 of 12 Signed: 02/23/2023 4:05:27 PM aylor Entered By: Adline Peals on 02/23/2023 12:55:47 -------------------------------------------------------------------------------- Debridement Details Patient Name: Date of Service: [name], [name] 02/23/2023 12:30 PM Medical Record Number: [id number] Patient Account Number: [id number] Date of Birth/Sex: Treating RN: 1946-04-08 (77 y.o. Harlow Ohms Primary Care Provider: Lynnda Shields, [place] [facility] N Other Clinician: Referring Provider: Treating Provider/Extender: Sheryle Spray, [place] RGA N Weeks in Treatment: 20 Debridement Performed for Assessment: Wound #6 Right,Lateral Ankle Performed By: Physician Fredirick Maudlin, MD Debridement Type: Debridement Severity of Tissue Pre Debridement: Fat layer exposed Level of Consciousness (Pre-procedure): Awake and Alert Pre-procedure Verification/Time Out Yes - 12:53 Taken: Start Time: 12:53 Pain Control: Lidocaine 4% T opical Solution T Area Debrided (L x W): otal 1 (cm) x 1 (cm) = 1 (cm) Tissue and other material debrided: Non-Viable, Slough, Slough Level: Non-Viable Tissue Debridement Description: Selective/Open Wound Instrument: Curette Bleeding: Minimum Hemostasis Achieved: Pressure Response to Treatment: Procedure was tolerated well Level of Consciousness (Post- Awake and  Alert procedure): Post Debridement Measurements of Total Wound Length: (cm) 1 Width: (cm) 1 Depth: (cm) 0.2 Volume: (cm) 0.157 Character of Wound/Ulcer Post Debridement: Improved Severity of Tissue Post Debridement: Fat layer exposed Post Procedure Diagnosis Same as Pre-procedure Notes scribed for Dr. Celine Ahr by Adline Peals, RN Electronic Signature(s) Signed: 02/23/2023 1:12:54 PM By: Fredirick Maudlin MD FACS Signed: 02/23/2023 4:05:27 PM By: Adline Peals Entered By: Adline Peals on 02/23/2023 12:57:22 -------------------------------------------------------------------------------- HPI Details Patient Name: Date of Service: [name], [name]. 02/23/2023 12:30 PM Medical  Record Number: [id number] Patient Account Number: [id number] Date of Birth/Sex: Treating RN: 1946-08-18 (77 y.o. F) Primary Care Provider: Lynnda Shields, [name] [facility] N Other Clinician: Referring Provider: Treating Provider/Extender: Sheryle Spray, [place] RGA N Weeks in Treatment: 20 History of Present Illness HPI Description: 07/09/18 on evaluation today patient presents for bilateral lower extremity ulcers. She has a past medical history significant for chronic venous stasis, hypertension, and lower extremity edema with associated skin changes. Unfortunately she tells me that roughly 3 weeks ago she noted ulcers on the left lower extremity which started given her trouble. Subsequently she ended up proceeding with applying mupirocin ointment and was keeping this open air for the most part. She states that it would scab over somewhat and then reopened. The wounds have been training quite significantly. Fortunately there does not appear to be any evidence of overt infection there is some erythematous type skin changes but I believe this is likely stasis dermatitis nothing more. Fortunately she has no evidence of any systemic infection. No fevers, chills, nausea, or vomiting noted at this time. She has  been recommended before to wear compression stockings but she does not wear them on a regular basis. She has never been suggested to get Juxta-Lite compression wraps she was not aware of what these were. [name], [name] ([id number]) [id number] Page 3 of 12 Patient had venous studies performed on 11/09/17 which showed no lower surety DVT or deep vein insufficiency noted. She did not have evaluation however of the superficial veins. There was full compressibility noted throughout. Her arterial study which was performed on 11/18/17 actually showed that she has an ABI on the right of 1.17 with a TBI of 1.59 along with an ABI of 1.12 on the left and a TBI of 0.99. 07/17/18 on evaluation today patient appears to be doing much better in regard to her left lower Trinity ulcers. She's been tolerating the dressing changes without complication. There does not appear to be any evidence of infection which is good news. Overall I'm very pleased with the progress at this time. 07/24/18 on evaluation today patient's wound actually appears to be completely healed at this time. Fortunately there does not appear to be any evidence of infection which is good news. Overall I'm very happy with the progress that has been made. She did get her Velcro compression wraps. READMISSION 12/31/2020 This is a patient we had in this clinic in 2019 felt to have chronic venous insufficiency wounds on her lower extremities. She was discharged with bilateral compression stockings. She tells [place] that she never really wore them or at best wore them sparingly. She is back in clinic today having a ulcer on her left lateral lower extremity since October and one on the right anterior for the last 2 weeks. I do not know that she is doing any primary dressing on these certainly not wearing any form of compression. She is already been evaluated by vein and vascular on 11/15/2020. They noted that she did have ulcers on  the left leg in October that healed with a need Unna boot. She had a reflux study on 12/20 that did not show evidence of a DVT or venous reflux bilaterally. They was not felt that she had arterial insufficiency based on the fact that she had brisk biphasic Doppler signals in the bilateral DP PT. The patient comes in today with superficial wounds on the right anterior mid tibia and the left lateral calf. These are small appear to have healthy  surfaces. Past medical history includes cervical and upper thoracic spine surgery on 7/21, hernia repair early in January, hypertension, hyperlipidemia, hypothyroidism, gastric bypass surgery, DVT in the right peroneal artery and left popliteal and peroneal veins. Next problem CHF. She does not have diabetes ABIs in our clinic were 1.1 on the right and 1.2 on the lower 2/11; the patient's wound on the left lateral and right anterior lower leg are closed for edema control is good. She has an assortment of old juxta lite stockings with old liners. She does not like to wear anything in particular under the compression part of the juxta lite stocking. I think she is going to do exactly what she wants. READMISSION 10/05/2022 This is a 77 year old woman last seen in our clinic about 18 months ago. She has a history of venous stasis and stasis dermatitis. A reflux study done in 2021 did not show any evidence of venous reflux. She says about 3 weeks ago, she noticed an ulcer on her right lateral malleolus. She was applying triple antibiotic ointment to it for a while and then was soaking it in Epsom salts. She reports that she usually wears compression stockings, but that they were rubbing so she did not wear 1 on her right leg since the wound appeared, she has a stocking with the toe and heel cut out applied to her left leg. On examination, she has changes of chronic stasis dermatitis on the right. There is a shallow ulcer overlying the lateral malleolus. The fat layer  is exposed. It is a little bit desiccated but fairly clean with just some light eschar and slough present. No malodor or purulent drainage. 10/13/2022: The wound is a little smaller today. Although the moisture balance is better than last week, it is still a bit dry. There is still some slough accumulation on the surface. 10/25/2022: The wound continues to contract. There is still thick layer of slough on the surface. The moisture balance of the wound is better, but the periwound skin is slightly macerated. 11/02/2022: The wound is smaller and shallower today. There is still slough on the surface, but the periwound skin is intact. The wound surface is still drier than ideal. 12/15; wound is about the same size minimal depth. Under illumination some debris on the surface she has been using Prisma 11/16/2022: The leg wrap became saturated when her makeshift cast protector leaked. The wound is a little bit larger today, as result of moisture-related tissue breakdown. The surface remains fairly fibrotic. 11/30/2022: The wound measured a little bit larger today secondary to additional moisture related periwound breakdown. There is slough on the wound surface. Still fairly fibrotic underneath. 12/05/2022: The ankle wound is a little smaller but still has slough accumulation. She has been approved for Apligraf and we are going to apply that today. 12/15/2022: The wound is smaller again today. Minimal slough. Edema control is good. 12/29/2022: The wound continues to contract. The surface is a little bit less fibrotic today. Light slough accumulation. 01/12/2023: The wound is smaller and has a better fill of granulation tissue. There is just a little slough on the surface and a small remaining area in the center that is still fibrotic. 01/26/2023: The wound measured larger for some reason today, but on visual inspection it appears about the same size. There is more granulation tissue and just a very small area in the  center that remains fibrotic. Minimal slough. 02/15/2023: The wound on her right lateral ankle is a little bit smaller  and shallower today. The surface remains tough but less fibrotic than on initial intake. She has a wound on her left anterior tibia that she says has been present for about a month. She has been trying to manage it on her own at home but as it has not improved, she wanted [place] to take over management of this site. It is fairly superficial, essentially a deep scrape. There is some slough on the surface. No concern for infection. 02/23/2023: The wound on her left anterior tibial surface is nearly closed underneath the layer of eschar. The right lateral ankle wound is about the same size but a little bit shallower today. She says it has been more tender over the week and indeed, there is some erythema and swelling present. Electronic Signature(s) Signed: 02/23/2023 1:10:18 PM By: Fredirick Maudlin MD FACS Entered By: Fredirick Maudlin on 02/23/2023 13:10:18 -------------------------------------------------------------------------------- Physical Exam Details Patient Name: Date of Service: [name], [name]. 02/23/2023 12:30 PM [name] ([id number] Page 4 of 12 Medical Record Number: [id number] Patient Account Number: [id number] Date of Birth/Sex: Treating RN: 1946-01-12 (77 y.o. F) Primary Care Provider: Lynnda Shields, [name] [facility] N Other Clinician: Referring Provider: Treating Provider/Extender: Sheryle Spray, [place] RGA N Weeks in Treatment: 20 Constitutional Hypertensive, asymptomatic. . . . no acute distress. Respiratory Normal work of breathing on room air. Notes 02/23/2023: The wound on her left anterior tibial surface is nearly closed underneath the layer of eschar. The right lateral ankle wound is about the same size but a little bit shallower today. She says it has been more tender over the week and indeed, there is some  erythema and swelling present. Electronic Signature(s) Signed: 02/23/2023 1:10:46 PM By: Fredirick Maudlin MD FACS Entered By: Fredirick Maudlin on 02/23/2023 13:10:46 -------------------------------------------------------------------------------- Physician Orders Details Patient Name: Date of Service: [name], [name] Balo [name]. 02/23/2023 12:30 PM Medical Record Number: [id number] Patient Account Number: [id number] Date of Birth/Sex: Treating RN: [birth year] (77 y.o. Harlow Ohms Primary Care Provider: Lynnda Shields, [place] [facility] N Other Clinician: Referring Provider: Treating Provider/Extender: Sheryle Spray, [place] RGA N Weeks in Treatment: 20 Verbal / Phone Orders: No Diagnosis Coding ICD-10 Coding Code Description [id number] Non-pressure chronic ulcer of right ankle with fat layer exposed L97.821 Non-pressure chronic ulcer of other part of left lower leg limited to breakdown of skin I87.2 Venous insufficiency (chronic) (peripheral) 99991111 Chronic diastolic (congestive) heart failure E66.09 Other obesity due to excess calories Follow-up Appointments ppointment in 1 week. - Dr. Celine Ahr RM 2 Return A Anesthetic (In clinic) Topical Lidocaine 4% applied to wound bed Bathing/ Shower/ Hygiene May shower with protection but do not get wound dressing(s) wet. Protect dressing(s) with water repellant cover (for example, large plastic bag) or a cast cover and may then take shower. - may purchase cast protector at [facility], [facility] or [employer] Edema Control - Lymphedema / SCD / Other Bilateral Lower Extremities Elevate legs to the level of the heart or above for 30 minutes daily and/or when sitting for 3-4 times a day throughout the day. Avoid standing for long periods of time. Patient to wear own compression stockings every day. Moisturize legs daily. Wound Treatment Wound #6 - Ankle Wound Laterality: Right, Lateral Cleanser: Soap and Water 1 x Per Week/30 Days Discharge Instructions: May shower  and wash wound with dial antibacterial soap and water prior to dressing change. Peri-Wound Care: Sween Lotion (Moisturizing lotion) 1 x Per Week/30 Days Discharge Instructions: Apply moisturizing lotion as directed Topical: Mupirocin  Ointment 1 x Per Week/30 Days Discharge Instructions: Apply Mupirocin (Bactroban) as instructed Prim Dressing: Promogran Prisma Matrix, 4.34 (sq in) (silver collagen) 1 x Per Week/30 Days ary Discharge Instructions: Moisten collagen with saline or hydrogel Bills, Krystine [name] ([id number]) [phone].pdf Page 5 of 12 Secondary Dressing: Woven Gauze Sponge, Non-Sterile 4x4 in 1 x Per Week/30 Days Discharge Instructions: Apply over primary dressing as directed. Secured With: Transpore Surgical Tape, 2x10 (in/yd) 1 x Per Week/30 Days Discharge Instructions: Secure dressing with tape as directed. Compression Wrap: Urgo K2, two layer compression system, regular 1 x Per Week/30 Days Discharge Instructions: Apply Urgo K2 as directed (alternative to 4 layer compression). Wound #8 - Lower Leg Wound Laterality: Left, Anterior Peri-Wound Care: Sween Lotion (Moisturizing lotion) 1 x Per Week Discharge Instructions: Apply moisturizing lotion as directed Prim Dressing: Maxorb Extra Ag+ Alginate Dressing, 2x2 (in/in) 1 x Per Week ary Discharge Instructions: Apply to wound bed as instructed Secondary Dressing: Woven Gauze Sponge, Non-Sterile 4x4 in 1 x Per Week Discharge Instructions: Apply over primary dressing as directed. Compression Wrap: Urgo K2, two layer compression system, regular 1 x Per Week Discharge Instructions: Apply Urgo K2 as directed (alternative to 4 layer compression). Patient Medications llergies: cocoa, red dye, Lyrica, Sulfa (Sulfonamide Antibiotics) A Notifications Medication Indication Start End 02/23/2023 lidocaine DOSE topical 4 % cream - cream topical Electronic Signature(s) Signed: 02/23/2023 1:12:54 PM By: Fredirick Maudlin MD FACS Entered By: Fredirick Maudlin on 02/23/2023 13:11:23 -------------------------------------------------------------------------------- Problem List Details Patient Name: Date of Service: [name], [name] Balo [name]. 02/23/2023 12:30 PM Medical Record Number: [id number] Patient Account Number: [id number] Date of Birth/Sex: Treating RN: [birth year] (77 y.o. F) Primary Care Provider: Lynnda Shields, [place] [facility] N Other Clinician: Referring Provider: Treating Provider/Extender: Sheryle Spray, [place] RGA N Weeks in Treatment: 20 Active Problems ICD-10 Encounter Code Description Active Date MDM Diagnosis L97.312 Non-pressure chronic ulcer of right ankle with fat layer exposed 10/05/2022 No Yes L97.821 Non-pressure chronic ulcer of other part of left lower leg limited to breakdown 02/15/2023 No Yes of skin I87.2 Venous insufficiency (chronic) (peripheral) 10/05/2022 No Yes [id number] Chronic diastolic (congestive) heart failure 10/05/2022 No Yes E66.09 Other obesity due to excess calories 10/05/2022 No Yes Suder, Mckinsey [name] ([id number]) [phone].pdf Page 6 of 12 Inactive Problems Resolved Problems Electronic Signature(s) Signed: 02/23/2023 1:09:18 PM By: Fredirick Maudlin MD FACS Entered By: Fredirick Maudlin on 02/23/2023 13:09:18 -------------------------------------------------------------------------------- Progress Note Details Patient Name: Date of Service: [name], [name]. 02/23/2023 12:30 PM Medical Record Number: [id number] Patient Account Number: [id number] Date of Birth/Sex: Treating RN: 02/27/1946 (77 y.o. F) Primary Care Provider: Lynnda Shields, [place] [facility] N Other Clinician: Referring Provider: Treating Provider/Extender: Sheryle Spray, [place] RGA N Weeks in Treatment: 20 Subjective Chief Complaint Information obtained from Patient Bilateral LE Ulcers 10/05/2022: RLE ulcer History of Present Illness (HPI) 07/09/18 on evaluation today patient  presents for bilateral lower extremity ulcers. She has a past medical history significant for chronic venous stasis, hypertension, and lower extremity edema with associated skin changes. Unfortunately she tells me that roughly 3 weeks ago she noted ulcers on the left lower extremity which started given her trouble. Subsequently she ended up proceeding with applying mupirocin ointment and was keeping this open air for the most part. She states that it would scab over somewhat and then reopened. The wounds have been training quite significantly. Fortunately there does not appear to be any evidence of overt infection there is some erythematous type skin changes but I believe this  is likely stasis dermatitis nothing more. Fortunately she has no evidence of any systemic infection. No fevers, chills, nausea, or vomiting noted at this time. She has been recommended before to wear compression stockings but she does not wear them on a regular basis. She has never been suggested to get Juxta-Lite compression wraps she was not aware of what these were. Patient had venous studies performed on 11/09/17 which showed no lower surety DVT or deep vein insufficiency noted. She did not have evaluation however of the superficial veins. There was full compressibility noted throughout. Her arterial study which was performed on 11/18/17 actually showed that she has an ABI on the right of 1.17 with a TBI of 1.59 along with an ABI of 1.12 on the left and a TBI of 0.99. 07/17/18 on evaluation today patient appears to be doing much better in regard to her left lower Trinity ulcers. She's been tolerating the dressing changes without complication. There does not appear to be any evidence of infection which is good news. Overall I'm very pleased with the progress at this time. 07/24/18 on evaluation today patient's wound actually appears to be completely healed at this time. Fortunately there does not appear to be any evidence  of infection which is good news. Overall I'm very happy with the progress that has been made. She did get her Velcro compression wraps. READMISSION 12/31/2020 This is a patient we had in this clinic in 2019 felt to have chronic venous insufficiency wounds on her lower extremities. She was discharged with bilateral compression stockings. She tells [place] that she never really wore them or at best wore them sparingly. She is back in clinic today having a ulcer on her left lateral lower extremity since October and one on the right anterior for the last 2 weeks. I do not know that she is doing any primary dressing on these certainly not wearing any form of compression. She is already been evaluated by vein and vascular on 11/15/2020. They noted that she did have ulcers on the left leg in October that healed with a need Unna boot. She had a reflux study on 12/20 that did not show evidence of a DVT or venous reflux bilaterally. They was not felt that she had arterial insufficiency based on the fact that she had brisk biphasic Doppler signals in the bilateral DP PT. The patient comes in today with superficial wounds on the right anterior mid tibia and the left lateral calf. These are small appear to have healthy surfaces. Past medical history includes cervical and upper thoracic spine surgery on 7/21, hernia repair early in January, hypertension, hyperlipidemia, hypothyroidism, gastric bypass surgery, DVT in the right peroneal artery and left popliteal and peroneal veins. Next problem CHF. She does not have diabetes ABIs in our clinic were 1.1 on the right and 1.2 on the lower 2/11; the patient's wound on the left lateral and right anterior lower leg are closed for edema control is good. She has an assortment of old juxta lite stockings with old liners. She does not like to wear anything in particular under the compression part of the juxta lite stocking. I think she is going to do exactly what  she wants. READMISSION 10/05/2022 This is a 77 year old woman last seen in our clinic about 18 months ago. She has a history of venous stasis and stasis dermatitis. A reflux study done in 2021 did not show any evidence of venous reflux. She says about 3 weeks ago, she noticed an ulcer  on her right lateral malleolus. She was applying triple antibiotic ointment to it for a while and then was soaking it in Epsom salts. She reports that she usually wears compression stockings, but that they were rubbing so she did not wear 1 on her right leg since the wound appeared, she has a stocking with the toe and heel cut out applied to her left leg. On examination, she has changes of chronic stasis dermatitis on the right. There is a shallow ulcer overlying the lateral malleolus. The fat layer is exposed. It is a little bit desiccated but fairly clean with just some light eschar and slough present. No malodor or purulent drainage. 10/13/2022: The wound is a little smaller today. Although the moisture balance is better than last week, it is still a bit dry. There is still some slough accumulation on the surface. 10/25/2022: The wound continues to contract. There is still thick layer of slough on the surface. The moisture balance of the wound is better, but the periwound skin is slightly macerated. DEZIAH, WALLEN ([id number]) [id number] Page 7 of 12 11/02/2022: The wound is smaller and shallower today. There is still slough on the surface, but the periwound skin is intact. The wound surface is still drier than ideal. 12/15; wound is about the same size minimal depth. Under illumination some debris on the surface she has been using Prisma 11/16/2022: The leg wrap became saturated when her makeshift cast protector leaked. The wound is a little bit larger today, as result of moisture-related tissue breakdown. The surface remains fairly fibrotic. 11/30/2022: The wound measured a little  bit larger today secondary to additional moisture related periwound breakdown. There is slough on the wound surface. Still fairly fibrotic underneath. 12/05/2022: The ankle wound is a little smaller but still has slough accumulation. She has been approved for Apligraf and we are going to apply that today. 12/15/2022: The wound is smaller again today. Minimal slough. Edema control is good. 12/29/2022: The wound continues to contract. The surface is a little bit less fibrotic today. Light slough accumulation. 01/12/2023: The wound is smaller and has a better fill of granulation tissue. There is just a little slough on the surface and a small remaining area in the center that is still fibrotic. 01/26/2023: The wound measured larger for some reason today, but on visual inspection it appears about the same size. There is more granulation tissue and just a very small area in the center that remains fibrotic. Minimal slough. 02/15/2023: The wound on her right lateral ankle is a little bit smaller and shallower today. The surface remains tough but less fibrotic than on initial intake. She has a wound on her left anterior tibia that she says has been present for about a month. She has been trying to manage it on her own at home but as it has not improved, she wanted [place] to take over management of this site. It is fairly superficial, essentially a deep scrape. There is some slough on the surface. No concern for infection. 02/23/2023: The wound on her left anterior tibial surface is nearly closed underneath the layer of eschar. The right lateral ankle wound is about the same size but a little bit shallower today. She says it has been more tender over the week and indeed, there is some erythema and swelling present. Patient History Information obtained from Patient. Family History Cancer - Mother,Father, Diabetes - Father, Heart Disease - Father, Hypertension - Father, Stroke - Mother, No family history of Hereditary  Spherocytosis, Kidney Disease, Lung Disease, Seizures, Thyroid Problems, Tuberculosis. Social History Never smoker, Marital Status - Married, Alcohol Use - Daily - 1.5 bottles of wine daily, Drug Use - No History, Caffeine Use - Daily - coffee. Medical History Eyes Patient has history of Cataracts - mild Denies history of Glaucoma, Optic Neuritis Ear/Nose/Mouth/Throat Denies history of Chronic sinus problems/congestion, Middle ear problems Hematologic/Lymphatic Patient has history of Anemia - iron deficiency, Lymphedema Denies history of Hemophilia, Human Immunodeficiency Virus, Sickle Cell Disease Respiratory Denies history of Aspiration, Asthma, Chronic Obstructive Pulmonary Disease (COPD), Pneumothorax, Sleep Apnea, Tuberculosis Cardiovascular Patient has history of Congestive Heart Failure, Hypertension, Peripheral Venous Disease Denies history of Angina, Arrhythmia, Coronary Artery Disease, Deep Vein Thrombosis, Hypotension, Myocardial Infarction, Peripheral Arterial Disease, Phlebitis, Vasculitis Gastrointestinal Denies history of Cirrhosis , Colitis, Crohnoos, Hepatitis A, Hepatitis B, Hepatitis C Endocrine Denies history of Type I Diabetes, Type II Diabetes Genitourinary Denies history of End Stage Renal Disease Immunological Denies history of Lupus Erythematosus, Raynaudoos, Scleroderma Integumentary (Skin) Denies history of History of Burn Musculoskeletal Patient has history of Osteoarthritis Denies history of Gout, Rheumatoid Arthritis, Osteomyelitis Neurologic Patient has history of Neuropathy - bila Denies history of Dementia, Quadriplegia, Paraplegia, Seizure Disorder Oncologic Denies history of Received Chemotherapy, Received Radiation Psychiatric Patient has history of Confinement Anxiety Denies history of Anorexia/bulimia Hospitalization/Surgery History - right arm surgery. - gastric bypass. - hysterectomy. - tonsillectomy. - hernia repair. - cervical  myelopathy with spinal cord compression. Medical A Surgical History Notes nd Constitutional Symptoms (General Health) obesity Ear/Nose/Mouth/Throat seasonal allergies Cardiovascular aortic murmur, hyperlipidemia, hypertriglyceridemia Gastrointestinal UGI bleed, Endocrine WANIKA, [name] ([id number]) [phone].pdf Page 8 of 12 hypothyroidism Objective Constitutional Hypertensive, asymptomatic. no acute distress. Vitals Time Taken: 12:37 PM, Height: 65 in, Weight: 229 lbs, BMI: 38.1, Temperature: 98 F, Pulse: 72 bpm, Respiratory Rate: 16 breaths/min, Blood Pressure: 159/69 mmHg. Respiratory Normal work of breathing on room air. General Notes: 02/23/2023: The wound on her left anterior tibial surface is nearly closed underneath the layer of eschar. The right lateral ankle wound is about the same size but a little bit shallower today. She says it has been more tender over the week and indeed, there is some erythema and swelling present. Integumentary (Hair, Skin) Wound #6 status is Open. Original cause of wound was Gradually Appeared. The date acquired was: 09/25/2022. The wound has been in treatment 20 weeks. The wound is located on the Right,Lateral Ankle. The wound measures 1cm length x 1cm width x 0.2cm depth; 0.785cm^2 area and 0.157cm^3 volume. There is Fat Layer (Subcutaneous Tissue) exposed. There is no tunneling or undermining noted. There is a medium amount of serous drainage noted. The wound margin is flat and intact. There is large (67-100%) red, pink granulation within the wound bed. There is a small (1-33%) amount of necrotic tissue within the wound bed including Adherent Slough. The periwound skin appearance had no abnormalities noted for texture. The periwound skin appearance exhibited: Hemosiderin Staining. The periwound skin appearance did not exhibit: Dry/Scaly, Maceration, Erythema. Periwound temperature was noted as No Abnormality. The  periwound has tenderness on palpation. Wound #8 status is Open. Original cause of wound was Trauma. The date acquired was: 01/25/2023. The wound has been in treatment 1 weeks. The wound is located on the Left,Anterior Lower Leg. The wound measures 2.9cm length x 1.1cm width x 0.1cm depth; 2.505cm^2 area and 0.251cm^3 volume. There is Fat Layer (Subcutaneous Tissue) exposed. There is no tunneling or undermining noted. There is a medium amount of serous drainage  noted. The wound margin is flat and intact. There is large (67-100%) red, pink granulation within the wound bed. There is no necrotic tissue within the wound bed. The periwound skin appearance had no abnormalities noted for texture. The periwound skin appearance had no abnormalities noted for moisture. The periwound skin appearance had no abnormalities noted for color. 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 There was a Selective/Open Wound Non-Viable Tissue Debridement with a total area of 1 sq cm performed by Fredirick Maudlin, MD. With the following instrument(s): Curette to remove Non-Viable tissue/material. Material removed includes [facility] after achieving pain control using Lidocaine 4% Topical Solution. No specimens were taken. A time out was conducted at 12:53, prior to the start of the procedure. A Minimum amount of bleeding was controlled with Pressure. The procedure was tolerated well. Post Debridement Measurements: 1cm length x 1cm width x 0.2cm depth; 0.157cm^3 volume. Character of  Wound/Ulcer Post Debridement is improved. Severity of Tissue Post Debridement is: Fat layer exposed. Post procedure Diagnosis Wound #6: Same as Pre-Procedure General Notes: scribed for Dr. Celine Ahr by Adline Peals, RN. Pre-procedure diagnosis of Wound #6 is a Venous Leg Ulcer located on the Right,Lateral Ankle . There was a Double Layer Compression Therapy Procedure by Adline Peals, RN. Post procedure Diagnosis Wound #6: Same as Pre-Procedure Wound #8 Pre-procedure diagnosis of Wound #8 is a Venous Leg Ulcer located on the Left,Anterior Lower Leg .Severity of Tissue Pre Debridement is: Fat layer exposed. There was a Selective/Open Wound Non-Viable Tissue Debridement with a total area of 3.19 sq cm performed by Fredirick Maudlin, MD. With the following instrument(s): Curette to remove Non-Viable tissue/material. Material removed includes Eschar after achieving pain control using Lidocaine 4% Topical Solution. No specimens were taken. A time out was conducted at 12:53, prior to the start of the procedure. A Minimum amount of bleeding was controlled with Pressure. The procedure was tolerated well. Post Debridement Measurements: 2.9cm length x 1.1cm width x 0.1cm depth; 0.251cm^3 volume. Character of Wound/Ulcer Post Debridement is improved. Severity of Tissue Post Debridement is: Fat layer exposed. Post procedure Diagnosis Wound #8: Same as Pre-Procedure [name], [name] ([id number]) [id number].pdf Page 9 of 12 General Notes: scribed for Dr. Celine Ahr by Adline Peals, RN. Pre-procedure diagnosis of Wound #8 is a Venous Leg Ulcer located on the Left,Anterior Lower Leg . There was a Double Layer Compression Therapy Procedure by Adline Peals, RN. Post procedure Diagnosis Wound #8: Same as Pre-Procedure Plan Follow-up Appointments: Return Appointment in 1 week. - Dr. Celine Ahr RM 2 Anesthetic: (In clinic) Topical Lidocaine 4% applied to wound bed Bathing/  Shower/ Hygiene: May shower with protection but do not get wound dressing(s) wet. Protect dressing(s) with water repellant cover (for example, large plastic bag) or a cast cover and may then take shower. - may purchase cast protector at [facility], [facility] or [employer] Edema Control - Lymphedema / SCD / Other: Elevate legs to the level of the heart or above for 30 minutes daily and/or when sitting for 3-4 times a day throughout the day. Avoid standing for long periods of time. Patient to wear own compression stockings every day. Moisturize  legs daily. The following medication(s) was prescribed: lidocaine topical 4 % cream cream topical was prescribed at facility WOUND #6: - Ankle Wound Laterality: Right, Lateral Cleanser: Soap and Water 1 x Per Week/30 Days Discharge Instructions: May shower and wash wound with dial antibacterial soap and water prior to dressing change. Peri-Wound Care: Sween Lotion (Moisturizing lotion) 1 x Per Week/30 Days Discharge Instructions: Apply moisturizing lotion as directed Topical: Mupirocin Ointment 1 x Per Week/30 Days Discharge Instructions: Apply Mupirocin (Bactroban) as instructed Prim Dressing: Promogran Prisma Matrix, 4.34 (sq in) (silver collagen) 1 x Per Week/30 Days ary Discharge Instructions: Moisten collagen with saline or hydrogel Secondary Dressing: Woven Gauze Sponge, Non-Sterile 4x4 in 1 x Per Week/30 Days Discharge Instructions: Apply over primary dressing as directed. Secured With: Transpore Surgical T ape, 2x10 (in/yd) 1 x Per Week/30 Days Discharge Instructions: Secure dressing with tape as directed. Com pression Wrap: Urgo K2, two layer compression system, regular 1 x Per Week/30 Days Discharge Instructions: Apply Urgo K2 as directed (alternative to 4 layer compression). WOUND #8: - Lower Leg Wound Laterality: Left, Anterior Peri-Wound Care: Sween Lotion (Moisturizing lotion) 1 x Per Week/ Discharge Instructions: Apply moisturizing lotion as  directed Prim Dressing: Maxorb Extra Ag+ Alginate Dressing, 2x2 (in/in) 1 x Per Week/ ary Discharge Instructions: Apply to wound bed as instructed Secondary Dressing: Woven Gauze Sponge, Non-Sterile 4x4 in 1 x Per Week/ Discharge Instructions: Apply over primary dressing as directed. Com pression Wrap: Urgo K2, two layer compression system, regular 1 x Per Week/ Discharge Instructions: Apply Urgo K2 as directed (alternative to 4 layer compression). 02/23/2023: The wound on her left anterior tibial surface is nearly closed underneath the layer of eschar. The right lateral ankle wound is about the same size but a little bit shallower today. She says it has been more tender over the week and indeed, there is some erythema and swelling present. I used a curette to debride eschar off of the left anterior tibial wound and slough from the right lateral ankle wound. We will continue silver alginate to the left leg. I am going to add some topical mupirocin to the right ankle wound and continue Prisma silver collagen. I am also going to increase her degree of compression from 3 layer equivalent to 4-layer equivalent. Follow-up in 1 week. Electronic Signature(s) Signed: 02/23/2023 1:12:08 PM By: Fredirick Maudlin MD FACS Entered By: Fredirick Maudlin on 02/23/2023 13:12:07 -------------------------------------------------------------------------------- HxROS Details Patient Name: Date of Service: [name], [name]. 02/23/2023 12:30 PM Medical Record Number: [id number] Patient Account Number: [id number] Date of Birth/Sex: Treating RN: 04-28-1946 (77 y.o. F) Primary Care Provider: Inetta Fermo [facility] N Other Clinician: Referring Provider: Treating Provider/Extender: Sheryle Spray, [place] [facility] N Weeks in Treatment: 20 Information Obtained From Patient Constitutional Symptoms ([place]) [place], [place] [name] ([id number]) [id number] Page 10 of 12 Medical History: Past  Medical History Notes: obesity Eyes Medical History: Positive for: Cataracts - mild Negative for: Glaucoma; Optic Neuritis Ear/Nose/Mouth/Throat Medical History: Negative for: Chronic sinus problems/congestion; Middle ear problems Past Medical History Notes: seasonal allergies Hematologic/Lymphatic Medical History: Positive for: Anemia - iron deficiency; Lymphedema Negative for: Hemophilia; Human Immunodeficiency Virus; Sickle Cell Disease Respiratory Medical History: Negative for: Aspiration; Asthma; Chronic Obstructive Pulmonary Disease (COPD); Pneumothorax; Sleep Apnea; Tuberculosis Cardiovascular Medical History: Positive for: Congestive Heart Failure; Hypertension; Peripheral Venous Disease Negative for: Angina; Arrhythmia; Coronary Artery Disease; Deep Vein Thrombosis; Hypotension; Myocardial Infarction; Peripheral Arterial Disease; Phlebitis; Vasculitis Past Medical History Notes: aortic murmur, hyperlipidemia, hypertriglyceridemia Gastrointestinal Medical  History: Negative for: Cirrhosis ; Colitis; Crohns; Hepatitis A; Hepatitis B; Hepatitis C Past Medical History Notes: UGI bleed, Endocrine Medical History: Negative for: Type I Diabetes; Type II Diabetes Past Medical History Notes: hypothyroidism Genitourinary Medical History: Negative for: End Stage Renal Disease Immunological Medical History: Negative for: Lupus Erythematosus; Raynauds; Scleroderma Integumentary (Skin) Medical History: Negative for: History of Burn Musculoskeletal Medical History: Positive for: Osteoarthritis Negative for: Gout; Rheumatoid Arthritis; Osteomyelitis Neurologic Medical History: Positive for: Neuropathy - bila Negative for: Dementia; Quadriplegia; Paraplegia; Seizure Disorder Oncologic CHAUNTELL, CREDIT ([id number]) [id number] Page 11 of 12 Medical History: Negative for: Received Chemotherapy; Received Radiation Psychiatric Medical  History: Positive for: Confinement Anxiety Negative for: Anorexia/bulimia HBO Extended History Items Eyes: Cataracts Immunizations Pneumococcal Vaccine: Received Pneumococcal Vaccination: No Implantable Devices No devices added Hospitalization / Surgery History Type of Hospitalization/Surgery right arm surgery gastric bypass hysterectomy tonsillectomy hernia repair cervical myelopathy with spinal cord compression Family and Social History Cancer: Yes - Mother,Father; Diabetes: Yes - Father; Heart Disease: Yes - Father; Hereditary Spherocytosis: No; Hypertension: Yes - Father; Kidney Disease: No; Lung Disease: No; Seizures: No; Stroke: Yes - Mother; Thyroid Problems: No; Tuberculosis: No; Never smoker; Marital Status - Married; Alcohol Use: Daily - 1.5 bottles of wine daily; Drug Use: No History; Caffeine Use: Daily - coffee; Financial Concerns: No; Food, Clothing or Shelter Needs: No; Support System Lacking: No; Transportation Concerns: No Electronic Signature(s) Signed: 02/23/2023 1:12:54 PM By: Fredirick Maudlin MD FACS Entered By: Fredirick Maudlin on 02/23/2023 13:10:26 -------------------------------------------------------------------------------- SuperBill Details Patient Name: Date of Service: [name], Dawnell [name]. 02/23/2023 Medical Record Number: [id number] Patient Account Number: [id number] Date of Birth/Sex: Treating RN: 08/30/1946 (77 y.o. F) Primary Care Provider: Lynnda Shields, [place] [facility] N Other Clinician: Referring Provider: Treating Provider/Extender: Sheryle Spray, [place] RGA N Weeks in Treatment: 20 Diagnosis Coding ICD-10 Codes Code Description [id number] Non-pressure chronic ulcer of right ankle with fat layer exposed L97.821 Non-pressure chronic ulcer of other part of left lower leg limited to breakdown of skin I87.2 Venous insufficiency (chronic) (peripheral) 99991111 Chronic diastolic (congestive) heart failure E66.09 Other obesity due to excess  calories Facility Procedures : CPT4 Code: [id number] Description: [id number] - DEBRIDE WOUND 1ST 20 SQ CM OR < ICD-10 Diagnosis Description L97.312 Non-pressure chronic ulcer of right ankle with fat layer exposed L97.821 Non-pressure chronic ulcer of other part of left lower leg limited to breakdown o Modifier: f skin Quantity: 1 Physician Procedures MARILENE, GOLDBERG ([id number]): CPT4 Code Description [id number] - WC PHYS LEVEL 4 - EST PT ICD-10 Diagnosis Description L97.312 Non-pressure chronic ulcer of right ankle with fat layer exposed L97.821 Non-pressure chronic ulcer of other part of left  lower leg limite I87.2 Venous insufficiency (chronic) (peripheral) 99991111 Chronic diastolic (congestive) heart failure [id number] Page 12 of 12: Quantity Modifier 25 1 d to breakdown of skin KASSAM, Khyra [name] ([id number]): [id number] - WC PHYS DEBR WO ANESTH 20 SQ CM ICD-10 Diagnosis Description [id number] Non-pressure chronic ulcer of right ankle with fat layer exposed L97.821 Non-pressure chronic ulcer of other part of left lower leg  limite [id number].pdf Page 12 of 12: 1 d to breakdown of skin Electronic Signature(s) Signed: 02/23/2023 1:12:28 PM By: Fredirick Maudlin MD FACS Entered By: Fredirick Maudlin on 02/23/2023 13:12:27

## 2023-02-24 NOTE — Progress Notes (Signed)
[name], [name] ([id number]) [id number] Page 1 of 8 Visit Report for 02/23/2023 Arrival Information Details Patient Name: Date of Service: [name], [name] 02/23/2023 12:30 PM Medical Record Number: [id number] Patient Account Number: [id number] Date of Birth/Sex: Treating RN: [name] (77 y.o. [name] Primary Care [name]: [name], [place] [name] Other Clinician: Referring [name]: Treating Caprice Wasko/Extender: [name], [place] [name]: 20 Visit Information History Since Last Visit Added or deleted any medications: No Patient Arrived: Cane Any new allergies or adverse reactions: No Arrival Time: 12:36 Had a fall or experienced change in No Accompanied By: husband activities of daily living that [name] affect Transfer Assistance: None risk of falls: Patient Identification Verified: Yes Signs or symptoms of abuse/neglect since last visito No Secondary Verification Process Completed: Yes Hospitalized since last visit: No Patient Requires Transmission-Based Precautions: No Implantable device outside of the clinic excluding No Patient Has Alerts: No cellular tissue based products placed in the center since last visit: Has Dressing in Place as Prescribed: Yes Has Compression in Place as Prescribed: Yes Pain Present Now: Yes Electronic Signature(s) Signed: 02/23/2023 4:05:27 PM By: Adline Peals Entered By: Adline Peals on 02/23/2023 12:36:34 -------------------------------------------------------------------------------- Compression Therapy Details Patient Name: Date of Service: [name], [name] Balo H. 02/23/2023 12:30 PM Medical Record Number: [id number] Patient Account Number: [id number] Date of Birth/Sex: Treating RN: [birth year] (77 y.o. [name] Primary Care Armine Rizzolo: [name], [place] [facility] [name] Other Clinician: Referring Olie Dibert: Treating Chyrl Elwell/Extender: [name], [place] [name]  [name] Weeks in [name]: 20 Compression Therapy Performed for Wound Assessment: Wound #8 Left,Anterior Lower Leg Performed By: Clinician Adline Peals, RN Compression Type: Double Layer Post Procedure Diagnosis Same as Pre-procedure Electronic Signature(s) Signed: 02/23/2023 4:05:27 PM By: Adline Peals Entered By: Adline Peals on 02/23/2023 12:56:03 -------------------------------------------------------------------------------- Compression Therapy Details Patient Name: Date of Service: [name], [name] 02/23/2023 12:30 PM Medical Record Number: [id number] Patient Account Number: [id number] Date of Birth/Sex: Treating RN: [birth year][name] (77 y.o. [name] Primary Care Amerie Beaumont: [name], [place] [facility] [name] Other Clinician: Referring Cicilia Clinger: Treating Albirda Shiel/Extender: [name], [place] [name]: 20 Compression Therapy Performed for Wound Assessment: Wound #6 Right,Lateral Ankle Performed By: Clinician Adline Peals, RN Compression Type: Double Layer Post Procedure Diagnosis Same as [name] ([id number] Page 2 of 8 Electronic Signature(s) Signed: 02/23/2023 4:05:27 PM By: Sabas Sous By: Adline Peals on 02/23/2023 12:56:03 -------------------------------------------------------------------------------- Encounter Discharge Information Details Patient Name: Date of Service: [name], [name] Balo H. 02/23/2023 12:30 PM Medical Record Number: [id number] Patient Account Number: [id number] Date of Birth/Sex: Treating RN: [birth year] [name] (77 y.o. [name] Primary Care Allayah Raineri: [name], [place] [facility] [name] Other Clinician: Referring Haroun Cotham: Treating Angeliah Wisdom/Extender: [name], [place] [name]: 20 Encounter Discharge Information Items Post Procedure Vitals Discharge Condition: Stable Temperature (F): 98 Ambulatory Status:  Cane Pulse (bpm): 72 Discharge Destination: Home Respiratory Rate (breaths/min): 16 Transportation: Private Auto Blood Pressure (mmHg): 159/69 Accompanied By: husband Schedule Follow-up Appointment: Yes Clinical Summary of Care: Patient Declined Electronic Signature(s) Signed: 02/23/2023 4:05:27 PM By: Adline Peals Entered By: Adline Peals on 02/23/2023 13:13:19 -------------------------------------------------------------------------------- Lower Extremity Assessment Details Patient Name: Date of Service: [name], [name] 02/23/2023 12:30 PM Medical Record Number: [id number] Patient Account Number: [id number] Date of Birth/Sex: Treating RN: [birth year][name] (77 y.o. [name] Primary Care Lilee Aldea: [name], [place] [facility] [name] Other Clinician: Referring Latorie Montesano: Treating Ulysees Robarts/Extender: [name], [place]  [name]: 20 Edema Assessment Assessed: [Left: No] [Right: No] [Left: Edema] [Right: :] Calf Left: Right: Point of Measurement: From Medial Instep 38.2 cm 33.8 cm Ankle Left: Right: Point of Measurement: From Medial Instep 22 cm 22.8 cm Vascular Assessment Pulses: Dorsalis Pedis Palpable: [Left:Yes] [Right:Yes] Electronic Signature(s) Signed: 02/23/2023 4:05:27 PM By: Adline Peals Entered By: Adline Peals on 02/23/2023 12:46:31 Multi Wound Chart Details -------------------------------------------------------------------------------- [name] ([id number] Page 3 of 8 Patient Name: Date of Service: [name], [name] 02/23/2023 12:30 PM Medical Record Number: [id number] Patient Account Number: [id number] Date of Birth/Sex: Treating RN: [name][birth year] (77 y.o. F) Primary Care [name]: [name], [place] [facility] [name] Other Clinician: Referring [name]: Treating Amitai Delaughter/Extender: [name], [place] [name]: 20 Vital Signs Height(in): 65 Pulse(bpm):  72 Weight(lbs): 229 Blood Pressure(mmHg): 159/69 Body Mass Index(BMI): 38.1 Temperature(F): 98 Respiratory Rate(breaths/min): 16 Wound Assessments Wound Number: 6 8 [name]/A Photos: [name]/A Right, Lateral Ankle Left, Anterior Lower Leg [name]/A Wound Location: Gradually Appeared Trauma [name]/A Wounding Event: Venous Leg Ulcer Venous Leg Ulcer [name]/A Primary Etiology: Cataracts, Anemia, Lymphedema, Cataracts, Anemia, Lymphedema, [name]/A Comorbid History: Congestive Heart Failure, Congestive Heart Failure, Hypertension, Peripheral Venous Hypertension, Peripheral Venous Disease, Osteoarthritis, Neuropathy, Disease, Osteoarthritis, Neuropathy, Confinement Anxiety Confinement Anxiety 09/25/2022 01/25/2023 [name]/A Date Acquired: 20 1 [name]/A Weeks of [name]: Open Open [name]/A Wound Status: No No [name]/A Wound Recurrence: 1x1x0.2 2.9x1.1x0.1 [name]/A Measurements L x W x D (cm) 0.785 2.505 [name]/A A (cm) : rea 0.157 0.251 [name]/A Volume (cm) : 24.30% 44.00% [name]/A % Reduction in A rea: -51.00% 44.00% [name]/A % Reduction in Volume: Full Thickness Without Exposed Full Thickness Without Exposed [name]/A Classification: Support Structures Support Structures Medium Medium [name]/A Exudate A mount: Serous Serous [name]/A Exudate Type: amber amber [name]/A Exudate Color: Flat and Intact Flat and Intact [name]/A Wound Margin: Large (67-100%) Large (67-100%) [name]/A Granulation A mount: Red, Pink Red, Pink [name]/A Granulation Quality: Small (1-33%) None Present (0%) [name]/A Necrotic A mount: Fat Layer (Subcutaneous Tissue): Yes Fat Layer (Subcutaneous Tissue): Yes [name]/A Exposed Structures: Fascia: No Fascia: No Tendon: No Tendon: No Muscle: No Muscle: No Joint: No Joint: No Bone: No Bone: No Small (1-33%) Small (1-33%) [name]/A Epithelialization: Debridement - Selective/Open Wound Debridement - Selective/Open Wound [name]/A Debridement: Pre-procedure Verification/Time Out 12:53 12:53 [name]/A Taken: Lidocaine 4% Topical Solution Lidocaine 4% Topical Solution  [name]/A Pain Control: Psychologist, prison and probation services [name]/A Tissue Debrided: Non-Viable Tissue Non-Viable Tissue [name]/A Level: 1 3.19 [name]/A Debridement A (sq cm): rea Curette Curette [name]/A Instrument: Minimum Minimum [name]/A Bleeding: Pressure Pressure [name]/A Hemostasis A chieved: Procedure was tolerated well Procedure was tolerated well [name]/A Debridement [name] Response: 1x1x0.2 2.9x1.1x0.1 [name]/A Post Debridement Measurements L x W x D (cm) 0.157 0.251 [name]/A Post Debridement Volume: (cm) No Abnormalities Noted No Abnormalities Noted [name]/A Periwound Skin Texture: Maceration: No No Abnormalities Noted [name]/A Periwound Skin Moisture: Dry/Scaly: No Hemosiderin Staining: Yes No Abnormalities Noted [name]/A Periwound Skin Color: Erythema: No No Abnormality [name]/A [name]/A Temperature: Yes [name]/A [name]/A Tenderness on Palpation: Compression Therapy Compression Therapy [name]/A Procedures Performed: Debridement Debridement [name], [name] ([id number] Page 4 of 8 [name] Notes Electronic Signature(s) Signed: 02/23/2023 1:09:24 PM By: Fredirick Maudlin MD FACS Entered By: Fredirick Maudlin on 02/23/2023 13:09:24 -------------------------------------------------------------------------------- Multi-Disciplinary Care Plan Details Patient Name: Date of Service: [name], [place] H. 02/23/2023 12:30 PM Medical Record Number: [id number] Patient Account Number: [id number] Date of Birth/Sex: Treating RN: [birth year] (77 y.o. [name] Primary Care Vonda Harth: [name], [place] [name]  [name] Other Clinician: Referring Areliz Rothman: Treating Bailee Metter/Extender: [name], [place] [name]: 20 Active Inactive Nutrition Nursing Diagnoses: Potential for alteratiion in Nutrition/Potential for imbalanced nutrition Goals: Patient/caregiver agrees to and verbalizes understanding of need to use nutritional supplements and/or vitamins as prescribed Date Initiated: 10/13/2022 Target  Resolution Date: 03/30/2023 Goal Status: Active Interventions: Provide education on nutrition [name] Activities: Dietary management education, guidance and counseling : 10/13/2022 Education provided on Nutrition : 10/25/2022 Notes: Electronic Signature(s) Signed: 02/23/2023 4:05:27 PM By: Adline Peals Entered By: Adline Peals on 02/23/2023 13:12:14 -------------------------------------------------------------------------------- Pain Assessment Details Patient Name: Date of Service: [name], [name] 02/23/2023 12:30 PM Medical Record Number: [id number] Patient Account Number: [id number] Date of Birth/Sex: Treating RN: [birth year] [name] (77 y.o. [name] Primary Care Raneen Jaffer: [name], [place] [facility] [name] Other Clinician: Referring Yunus Stoklosa: Treating Corleone Biegler/Extender: [name], [place] [name]: 20 Active Problems Location of Pain Severity and Description of Pain Patient Has Paino Yes Site Locations Pain LocationDAROLYN, [name] ([id number]) [id number] Page 5 of 8 Pain Location: Generalized Pain, Pain in Ulcers Duration of the Pain. Constant / Intermittento Intermittent Rate the pain. Current Pain Level: 4 Character of Pain Describe the Pain: Aching, Tender Pain Management and Medication Current Pain Management: Electronic Signature(s) Signed: 02/23/2023 4:05:27 PM By: Adline Peals Entered By: Adline Peals on 02/23/2023 12:36:55 -------------------------------------------------------------------------------- Patient/Caregiver Education Details Patient Name: Date of Service: [name], [name] 3/29/2024andnbsp12:30 PM Medical Record Number: [id number] Patient Account Number: [id number] Date of Birth/Gender: Treating RN: [name][birth year] (77 y.o. [name] Primary Care Physician: [name], [place] [facility] [name] Other Clinician: Referring Physician: Treating Physician/Extender: [name], [place] [name]: 20 Education Assessment Education Provided To: Patient Education Topics Provided Safety: Methods: Explain/Verbal Responses: Reinforcements needed, State content correctly Electronic Signature(s) Signed: 02/23/2023 4:05:27 PM By: Adline Peals Entered By: Adline Peals on 02/23/2023 13:12:26 -------------------------------------------------------------------------------- Wound Assessment Details Patient Name: Date of Service: [name], [name] 02/23/2023 12:30 PM Medical Record Number: [id number] Patient Account Number: [id number] Date of Birth/Sex: Treating RN: [birth year] [name] (77 y.o. [name] Primary Care Philbert Ocallaghan: [name], [place] [facility] [name] Other Clinician: Referring Deone Leifheit: Treating Princessa Lesmeister/Extender: [name], [place] [name]: 20 Wound Status Wound Number: 6 Primary Venous Leg Ulcer Etiology: Wound Location: Right, Lateral Ankle Wound Open Wounding Event: Gradually Talynn Caywood, [place] H ([id number]) [phone].pdf Page 6 of 8 Wounding Event: Gradually Appeared Status: Date Acquired: 09/25/2022 Comorbid Cataracts, Anemia, Lymphedema, Congestive Heart Failure, Weeks Of [name]: 20 History: Hypertension, Peripheral Venous Disease, Osteoarthritis, Clustered Wound: No Neuropathy, Confinement Anxiety Photos Wound Measurements Length: (cm) 1 Width: (cm) 1 Depth: (cm) 0.2 Area: (cm) 0.785 Volume: (cm) 0.157 % Reduction in Area: 24.3% % Reduction in Volume: -51% Epithelialization: Small (1-33%) Tunneling: No Undermining: No Wound Description Classification: Full Thickness Without Exposed Support Structures Wound Margin: Flat and Intact Exudate Amount: Medium Exudate Type: Serous Exudate Color: amber Foul Odor After Cleansing: No Slough/Fibrino Yes Wound Bed Granulation Amount: Large (67-100%) Exposed Structure Granulation Quality: Red,  Pink Fascia Exposed: No Necrotic Amount: Small (1-33%) Fat Layer (Subcutaneous Tissue) Exposed: Yes Necrotic Quality: Adherent Slough Tendon Exposed: No Muscle Exposed: No Joint Exposed: No Bone Exposed: No Periwound Skin Texture Texture Color No Abnormalities Noted: Yes No Abnormalities Noted: No Erythema: No Moisture Hemosiderin Staining: Yes No Abnormalities Noted: No Dry / Scaly: No Temperature / Pain Maceration: No Temperature: No Abnormality Tenderness on Palpation: Yes [name] Notes  Wound #6 (Ankle) Wound Laterality: Right, Lateral Cleanser Soap and Water Discharge Instruction: [name] shower and wash wound with dial antibacterial soap and water prior to dressing change. Peri-Wound Care Sween Lotion (Moisturizing lotion) Discharge Instruction: Apply moisturizing lotion as directed Topical Mupirocin Ointment Discharge Instruction: Apply Mupirocin (Bactroban) as instructed Primary Dressing Promogran Prisma Matrix, 4.34 (sq in) (silver collagen) Discharge Instruction: Moisten collagen with saline or hydrogel Secondary Dressing Woven Gauze Sponge, Non-Sterile 4x4 in [place], [place] H ([id number]) [phone].pdf Page 7 of 8 Discharge Instruction: Apply over primary dressing as directed. Secured With Transpore Surgical Tape, 2x10 (in/yd) Discharge Instruction: Secure dressing with tape as directed. Compression Wrap Urgo K2, two layer compression system, regular Discharge Instruction: Apply Urgo K2 as directed (alternative to 4 layer compression). Compression Stockings Add-Ons Electronic Signature(s) Signed: 02/23/2023 4:05:27 PM By: Adline Peals Entered By: Adline Peals on 02/23/2023 12:49:08 -------------------------------------------------------------------------------- Wound Assessment Details Patient Name: Date of Service: [name], [name] 02/23/2023 12:30 PM Medical Record Number: [id number] Patient Account Number:  [id number] Date of Birth/Sex: Treating RN: [birth year][name] (77 y.o. [name] Primary Care Camran Keady: [name], [place] [facility] [name] Other Clinician: Referring Kashawn Dirr: Treating Aurelius Gildersleeve/Extender: [name], [place] [name]: 20 Wound Status Wound Number: 8 Primary Venous Leg Ulcer Etiology: Wound Location: Left, Anterior Lower Leg Wound Open Wounding Event: Trauma Status: Date Acquired: 01/25/2023 Comorbid Cataracts, Anemia, Lymphedema, Congestive Heart Failure, Weeks Of [name]: 1 History: Hypertension, Peripheral Venous Disease, Osteoarthritis, Clustered Wound: No Neuropathy, Confinement Anxiety Photos Wound Measurements Length: (cm) 2.9 Width: (cm) 1.1 Depth: (cm) 0.1 Area: (cm) 2.505 Volume: (cm) 0.251 % Reduction in Area: 44% % Reduction in Volume: 44% Epithelialization: Small (1-33%) Tunneling: No Undermining: No Wound Description Classification: Full Thickness Without Exposed Support Structures Wound Margin: Flat and Intact Exudate Amount: Medium Exudate Type: Serous Exudate Color: amber Foul Odor After Cleansing: No Slough/Fibrino No Wound Bed Granulation Amount: Large (67-100%) Exposed Structure Granulation Quality: Red, Pink Fascia Exposed: No Necrotic Amount: None Present (0%) Fat Layer (Subcutaneous Tissue) Exposed: Yes Tendon Exposed: No Muscle Exposed: No Joint Exposed: No Creasey, Annamary H ([id number].pdf Page 8 of 8 Bone Exposed: No Periwound Skin Texture Texture Color No Abnormalities Noted: Yes No Abnormalities Noted: Yes Moisture No Abnormalities Noted: Yes [name] Notes Wound #8 (Lower Leg) Wound Laterality: Left, Anterior Cleanser Peri-Wound Care Sween Lotion (Moisturizing lotion) Discharge Instruction: Apply moisturizing lotion as directed Topical Primary Dressing Maxorb Extra Ag+ Alginate Dressing, 2x2 (in/in) Discharge Instruction: Apply to wound bed as  instructed Secondary Dressing Woven Gauze Sponge, Non-Sterile 4x4 in Discharge Instruction: Apply over primary dressing as directed. Secured With Compression Wrap Urgo K2, two layer compression system, regular Discharge Instruction: Apply Urgo K2 as directed (alternative to 4 layer compression). Compression Stockings Add-Ons Electronic Signature(s) Signed: 02/23/2023 4:05:27 PM By: Adline Peals Entered By: Adline Peals on 02/23/2023 12:49:25 -------------------------------------------------------------------------------- Vitals Details Patient Name: Date of Service: [name], Anays H. 02/23/2023 12:30 PM Medical Record Number: [id number] Patient Account Number: [id number] Date of Birth/Sex: Treating RN: [name][birth year] (77 y.o. [name] Primary Care Mekiyah Gladwell: [name], [place] [facility] [name] Other Clinician: Referring Shelita Steptoe: Treating Jaasiel Hollyfield/Extender: [name], [place] [name]: 20 Vital Signs Time Taken: 12:37 Temperature (F): 98 Height (in): 65 Pulse (bpm): 72 Weight (lbs): 229 Respiratory Rate (breaths/min): 16 Body Mass Index (BMI): 38.1 Blood Pressure (mmHg): 159/69 Reference Range: 80 - 120 mg / dl Electronic Signature(s) Signed: 02/23/2023 4:05:27 PM By: Adline Peals Entered By: Adline Peals on  02/23/2023 12:40:35 

## 2023-03-02 ENCOUNTER — Encounter (HOSPITAL_BASED_OUTPATIENT_CLINIC_OR_DEPARTMENT_OTHER): Payer: PPO | Attending: General Surgery | Admitting: General Surgery

## 2023-03-02 DIAGNOSIS — I110 Hypertensive heart disease with heart failure: Secondary | ICD-10-CM | POA: Diagnosis not present

## 2023-03-02 DIAGNOSIS — Z6838 Body mass index (BMI) 38.0-38.9, adult: Secondary | ICD-10-CM | POA: Diagnosis not present

## 2023-03-02 DIAGNOSIS — L97821 Non-pressure chronic ulcer of other part of left lower leg limited to breakdown of skin: Secondary | ICD-10-CM | POA: Insufficient documentation

## 2023-03-02 DIAGNOSIS — I872 Venous insufficiency (chronic) (peripheral): Secondary | ICD-10-CM | POA: Diagnosis not present

## 2023-03-02 DIAGNOSIS — I5032 Chronic diastolic (congestive) heart failure: Secondary | ICD-10-CM | POA: Insufficient documentation

## 2023-03-02 DIAGNOSIS — L97312 Non-pressure chronic ulcer of right ankle with fat layer exposed: Secondary | ICD-10-CM | POA: Insufficient documentation

## 2023-03-02 DIAGNOSIS — E6609 Other obesity due to excess calories: Secondary | ICD-10-CM | POA: Insufficient documentation

## 2023-03-05 ENCOUNTER — Ambulatory Visit: Payer: PPO | Admitting: Sports Medicine

## 2023-03-05 ENCOUNTER — Ambulatory Visit (INDEPENDENT_AMBULATORY_CARE_PROVIDER_SITE_OTHER): Payer: PPO | Admitting: Sports Medicine

## 2023-03-05 DIAGNOSIS — M170 Bilateral primary osteoarthritis of knee: Secondary | ICD-10-CM

## 2023-03-05 MED ORDER — MELOXICAM 15 MG PO TABS: 15.0000 mg | ORAL_TABLET | Freq: Every day | ORAL | 1 refills | Status: DC

## 2023-03-05 MED ORDER — DOXYCYCLINE HYCLATE 100 MG PO TABS: 100.0000 mg | ORAL_TABLET | Freq: Two times a day (BID) | ORAL | 0 refills | Status: AC

## 2023-03-05 MED ORDER — PREDNISONE 50 MG PO TABS: ORAL_TABLET | ORAL | 0 refills | Status: DC

## 2023-03-05 NOTE — Progress Notes (Signed)
    Procedures performed today:    None.  Independent interpretation of notes and tests performed by another provider:   None.  Brief History, Exam, Impression, and Recommendations:    Primary osteoarthritis of both knees Pleasant 77 year old female, known knee osteoarthritis, last aspiration and injection was bilateral December 18, 2022. She had an increase in swelling but this resolved on its own. On exam she does have what feels more to be lymphedema more so than effusion. Slightly erythematous, we did a course of doxycycline last time, I would like her to have another course of doxycycline as well as some prednisone on hand in case she needs it as they do have a trip coming up to the [place] in a couple of weeks.    ____________________________________________ Ihor Austin. Benjamin Stain, M.D., ABFM., CAQSM., AME. Primary Care and Sports Medicine [facility]  Adjunct Professor of Family Medicine  [employer]  Restaurant manager, fast food

## 2023-03-05 NOTE — Progress Notes (Signed)
[name], [name] ([id number]) [id number] Page 1 of 11 Visit Report for 03/02/2023 Chief Complaint Document Details Patient Name: Date of Service: [name], [name] 03/02/2023 12:30 PM Medical Record Number: [id number] Patient Account Number: [id number] Date of Birth/Sex: Treating RN: [birth year] (77 y.o. Tommye Standard Primary Care Provider: Jerel Shepherd, [place] [facility] N Other Clinician: Referring Provider: Treating Provider/Extender: Jacklynn Ganong, [place] [facility] N Weeks in Treatment: 21 Information Obtained from: Patient Chief Complaint Bilateral LE Ulcers 10/05/2022: RLE ulcer Electronic Signature(s) Signed: 03/02/2023 1:25:34 PM By: Duanne Guess MD FACS Entered By: Duanne Guess on 03/02/2023 13:25:34 -------------------------------------------------------------------------------- Debridement Details Patient Name: Date of Service: [name], [name]. 03/02/2023 12:30 PM Medical Record Number: [id number] Patient Account Number: [id number] Date of Birth/Sex: Treating RN: 01-07-1946 (77 y.o. Tommye Standard Primary Care Provider: Jerel Shepherd, [place] [facility] N Other Clinician: Referring Provider: Treating Provider/Extender: Jacklynn Ganong, [place] [facility] N Weeks in Treatment: 21 Debridement Performed for Assessment: Wound #6 Right,Lateral Ankle Performed By: Physician Duanne Guess, MD Debridement Type: Debridement Severity of Tissue Pre Debridement: Fat layer exposed Level of Consciousness (Pre-procedure): Awake and Alert Pre-procedure Verification/Time Out Yes - 13:05 Taken: Start Time: 13:08 Pain Control: Lidocaine 4% T opical Solution T Area Debrided (L x W): otal 0.9 (cm) x 0.9 (cm) = 0.81 (cm) Tissue and other material debrided: Non-Viable, Slough, Slough Level: Non-Viable Tissue Debridement Description: Selective/Open Wound Instrument: Curette Bleeding: Minimum Hemostasis Achieved: Pressure Procedural Pain: 2 Post Procedural Pain:  1 Response to Treatment: Procedure was tolerated well Level of Consciousness (Post- Awake and Alert procedure): Post Debridement Measurements of Total Wound Length: (cm) 0.9 Width: (cm) 0.9 Depth: (cm) 0.1 Volume: (cm) 0.064 Character of Wound/Ulcer Post Debridement: Improved Severity of Tissue Post Debridement: Fat layer exposed Post Procedure Diagnosis Same as Pre-procedure Notes scribed for Dr. Lady Gary by Zenaida Deed, RN Electronic Signature(s) [place], [place] [name] ([id number]) [phone].pdf Page 2 of 11 Signed: 03/02/2023 1:50:50 PM By: Duanne Guess MD FACS Signed: 03/05/2023 4:22:17 PM By: Zenaida Deed RN, BSN Entered By: Zenaida Deed on 03/02/2023 13:11:30 -------------------------------------------------------------------------------- HPI Details Patient Name: Date of Service: [name], [name]. 03/02/2023 12:30 PM Medical Record Number: [id number] Patient Account Number: [id number] Date of Birth/Sex: Treating RN: 06-02-1946 (77 y.o. Tommye Standard Primary Care Provider: Jerel Shepherd, [place] [facility] N Other Clinician: Referring Provider: Treating Provider/Extender: Jacklynn Ganong, [place] [facility] N Weeks in Treatment: 21 History of Present Illness HPI Description: 07/09/18 on evaluation today patient presents for bilateral lower extremity ulcers. She has a past medical history significant for chronic venous stasis, hypertension, and lower extremity edema with associated skin changes. Unfortunately she tells me that roughly 3 weeks ago she noted ulcers on the left lower extremity which started given her trouble. Subsequently she ended up proceeding with applying mupirocin ointment and was keeping this open air for the most part. She states that it would scab over somewhat and then reopened. The wounds have been training quite significantly. Fortunately there does not appear to be any evidence of overt infection there is some erythematous type skin  changes but I believe this is likely stasis dermatitis nothing more. Fortunately she has no evidence of any systemic infection. No fevers, chills, nausea, or vomiting noted at this time. She has been recommended before to wear compression stockings but she does not wear them on a regular basis. She has never been suggested to get Juxta-Lite compression wraps she was not aware of what these were. Patient had venous studies performed  on 11/09/17 which showed no lower surety DVT or deep vein insufficiency noted. She did not have evaluation however of the superficial veins. There was full compressibility noted throughout. Her arterial study which was performed on 11/18/17 actually showed that she has an ABI on the right of 1.17 with a TBI of 1.59 along with an ABI of 1.12 on the left and a TBI of 0.99. 07/17/18 on evaluation today patient appears to be doing much better in regard to her left lower Trinity ulcers. She's been tolerating the dressing changes without complication. There does not appear to be any evidence of infection which is good news. Overall I'm very pleased with the progress at this time. 07/24/18 on evaluation today patient's wound actually appears to be completely healed at this time. Fortunately there does not appear to be any evidence of infection which is good news. Overall I'm very happy with the progress that has been made. She did get her Velcro compression wraps. READMISSION 12/31/2020 This is a patient we had in this clinic in 2019 felt to have chronic venous insufficiency wounds on her lower extremities. She was discharged with bilateral compression stockings. She tells [place] that she never really wore them or at best wore them sparingly. She is back in clinic today having a ulcer on her left lateral lower extremity since October and one on the right anterior for the last 2 weeks. I do not know that she is doing any primary dressing on these certainly not wearing any form of  compression. She is already been evaluated by vein and vascular on 11/15/2020. They noted that she did have ulcers on the left leg in October that healed with a need Unna boot. She had a reflux study on 12/20 that did not show evidence of a DVT or venous reflux bilaterally. They was not felt that she had arterial insufficiency based on the fact that she had brisk biphasic Doppler signals in the bilateral DP PT. The patient comes in today with superficial wounds on the right anterior mid tibia and the left lateral calf. These are small appear to have healthy surfaces. Past medical history includes cervical and upper thoracic spine surgery on 7/21, hernia repair early in January, hypertension, hyperlipidemia, hypothyroidism, gastric bypass surgery, DVT in the right peroneal artery and left popliteal and peroneal veins. Next problem CHF. She does not have diabetes ABIs in our clinic were 1.1 on the right and 1.2 on the lower 2/11; the patient's wound on the left lateral and right anterior lower leg are closed for edema control is good. She has an assortment of old juxta lite stockings with old liners. She does not like to wear anything in particular under the compression part of the juxta lite stocking. I think she is going to do exactly what she wants. READMISSION 10/05/2022 This is a 77 year old woman last seen in our clinic about 18 months ago. She has a history of venous stasis and stasis dermatitis. A reflux study done in 2021 did not show any evidence of venous reflux. She says about 3 weeks ago, she noticed an ulcer on her right lateral malleolus. She was applying triple antibiotic ointment to it for a while and then was soaking it in Epsom salts. She reports that she usually wears compression stockings, but that they were rubbing so she did not wear 1 on her right leg since the wound appeared, she has a stocking with the toe and heel cut out applied to her left leg. On  examination, she has  changes of chronic stasis dermatitis on the right. There is a shallow ulcer overlying the lateral malleolus. The fat layer is exposed. It is a little bit desiccated but fairly clean with just some light eschar and slough present. No malodor or purulent drainage. 10/13/2022: The wound is a little smaller today. Although the moisture balance is better than last week, it is still a bit dry. There is still some slough accumulation on the surface. 10/25/2022: The wound continues to contract. There is still thick layer of slough on the surface. The moisture balance of the wound is better, but the periwound skin is slightly macerated. 11/02/2022: The wound is smaller and shallower today. There is still slough on the surface, but the periwound skin is intact. The wound surface is still drier than ideal. 12/15; wound is about the same size minimal depth. Under illumination some debris on the surface she has been using Prisma 11/16/2022: The leg wrap became saturated when her makeshift cast protector leaked. The wound is a little bit larger today, as result of moisture-related tissue breakdown. The surface remains fairly fibrotic. 11/30/2022: The wound measured a little bit larger today secondary to additional moisture related periwound breakdown. There is slough on the wound surface. Still fairly fibrotic underneath. 12/05/2022: The ankle wound is a little smaller but still has slough accumulation. She has been approved for Apligraf and we are going to apply that today. [name], [name] ([id number]) [id number] Page 3 of 11 12/15/2022: The wound is smaller again today. Minimal slough. Edema control is good. 12/29/2022: The wound continues to contract. The surface is a little bit less fibrotic today. Light slough accumulation. 01/12/2023: The wound is smaller and has a better fill of granulation tissue. There is just a little slough on the surface and a small remaining area in the  center that is still fibrotic. 01/26/2023: The wound measured larger for some reason today, but on visual inspection it appears about the same size. There is more granulation tissue and just a very small area in the center that remains fibrotic. Minimal slough. 02/15/2023: The wound on her right lateral ankle is a little bit smaller and shallower today. The surface remains tough but less fibrotic than on initial intake. She has a wound on her left anterior tibia that she says has been present for about a month. She has been trying to manage it on her own at home but as it has not improved, she wanted [place] to take over management of this site. It is fairly superficial, essentially a deep scrape. There is some slough on the surface. No concern for infection. 02/23/2023: The wound on her left anterior tibial surface is nearly closed underneath the layer of eschar. The right lateral ankle wound is about the same size but a little bit shallower today. She says it has been more tender over the week and indeed, there is some erythema and swelling present. 03/02/2023: The wound on her left anterior tibia is closed. The right lateral ankle wound is shallower with a more healthy-looking surface. Electronic Signature(s) Signed: 03/02/2023 1:26:09 PM By: Duanne Guess MD FACS Entered By: Duanne Guess on 03/02/2023 13:26:08 -------------------------------------------------------------------------------- Physical Exam Details Patient Name: Date of Service: [name], [name] Bray [name]. 03/02/2023 12:30 PM Medical Record Number: [id number] Patient Account Number: [id number] Date of Birth/Sex: Treating RN: 08/10/1946 (77 y.o. Tommye Standard Primary Care Provider: Jerel Shepherd, [place] [facility] N Other Clinician: Referring Provider: Treating Provider/Extender: Jacklynn Ganong, [place] [facility] N  Weeks in Treatment: 21 Constitutional Hypertensive, asymptomatic. . . . no acute distress. Respiratory Normal work of breathing on room  air. Notes 03/02/2023: The wound on her left anterior tibia is closed. The right lateral ankle wound is shallower with a more healthy-looking surface. Electronic Signature(s) Signed: 03/02/2023 1:26:48 PM By: Duanne Guess MD FACS Entered By: Duanne Guess on 03/02/2023 13:26:48 -------------------------------------------------------------------------------- Physician Orders Details Patient Name: Date of Service: [name], [name]. 03/02/2023 12:30 PM Medical Record Number: [id number] Patient Account Number: [id number] Date of Birth/Sex: Treating RN: Jan 12, 1946 (77 y.o. Tommye Standard Primary Care Provider: Jerel Shepherd, [place] [facility] N Other Clinician: Referring Provider: Treating Provider/Extender: Jacklynn Ganong, [place] [facility] N Weeks in Treatment: 1 Verbal / Phone Orders: No Diagnosis Coding ICD-10 Coding Code Description L97.312 Non-pressure chronic ulcer of right ankle with fat layer exposed L97.821 Non-pressure chronic ulcer of other part of left lower leg limited to breakdown of skin I87.2 Venous insufficiency (chronic) (peripheral) I50.32 Chronic diastolic (congestive) heart failure E66.09 Other obesity due to excess calories Follow-up Appointments ppointment in 1 week. - Dr. Lady Gary RM 2 Return A [place], [place] [name] ([id number]) [phone].pdf Page 4 of 11 Friday 4/12 @ 12:30 pm Anesthetic (In clinic) Topical Lidocaine 4% applied to wound bed Bathing/ Shower/ Hygiene May shower with protection but do not get wound dressing(s) wet. Protect dressing(s) with water repellant cover (for example, large plastic bag) or a cast cover and may then take shower. - may purchase cast protector at [facility], [facility] or [employer] Edema Control - Lymphedema / SCD / Other Bilateral Lower Extremities Elevate legs to the level of the heart or above for 30 minutes daily and/or when sitting for 3-4 times a day throughout the day. Avoid standing for long periods of  time. Patient to wear own compression stockings every day. Moisturize legs daily. Wound Treatment Wound #6 - Ankle Wound Laterality: Right, Lateral Cleanser: Soap and Water 1 x Per Week/30 Days Discharge Instructions: May shower and wash wound with dial antibacterial soap and water prior to dressing change. Peri-Wound Care: Sween Lotion (Moisturizing lotion) 1 x Per Week/30 Days Discharge Instructions: Apply moisturizing lotion as directed Topical: Mupirocin Ointment 1 x Per Week/30 Days Discharge Instructions: Apply Mupirocin (Bactroban) as instructed Prim Dressing: Promogran Prisma Matrix, 4.34 (sq in) (silver collagen) 1 x Per Week/30 Days ary Discharge Instructions: Moisten collagen with saline or hydrogel Secondary Dressing: Woven Gauze Sponge, Non-Sterile 4x4 in 1 x Per Week/30 Days Discharge Instructions: Apply over primary dressing as directed. Secured With: Transpore Surgical Tape, 2x10 (in/yd) 1 x Per Week/30 Days Discharge Instructions: Secure dressing with tape as directed. Compression Wrap: Urgo K2, two layer compression system, regular 1 x Per Week/30 Days Discharge Instructions: Apply Urgo K2 as directed (alternative to 4 layer compression). Electronic Signature(s) Signed: 03/02/2023 1:50:50 PM By: Duanne Guess MD FACS Entered By: Duanne Guess on 03/02/2023 13:27:00 -------------------------------------------------------------------------------- Problem List Details Patient Name: Date of Service: [name], Lada [name]. 03/02/2023 12:30 PM Medical Record Number: [id number] Patient Account Number: [id number] Date of Birth/Sex: Treating RN: 1946/03/01 (77 y.o. Tommye Standard Primary Care Provider: Jerel Shepherd, [place] [facility] N Other Clinician: Referring Provider: Treating Provider/Extender: Jacklynn Ganong, [place] [facility] N Weeks in Treatment: 21 Active Problems ICD-10 Encounter Code Description Active Date MDM Diagnosis L97.312 Non-pressure chronic ulcer of right  ankle with fat layer exposed 10/05/2022 No Yes L97.821 Non-pressure chronic ulcer of other part of left lower leg limited to breakdown 02/15/2023 No Yes of skin I87.2 Venous insufficiency (  chronic) (peripheral) 10/05/2022 No Yes I50.32 Chronic diastolic (congestive) heart failure 10/05/2022 No Yes Vessell, Raechelle [name] ([id number]) [phone].pdf Page 5 of 11 E66.09 Other obesity due to excess calories 10/05/2022 No Yes Inactive Problems Resolved Problems Electronic Signature(s) Signed: 03/02/2023 1:25:02 PM By: Duanne Guess MD FACS Entered By: Duanne Guess on 03/02/2023 13:25:02 -------------------------------------------------------------------------------- Progress Note Details Patient Name: Date of Service: [name], Grady [name]. 03/02/2023 12:30 PM Medical Record Number: [id number] Patient Account Number: [id number] Date of Birth/Sex: Treating RN: [birth year] (77 y.o. Tommye Standard Primary Care Provider: Jerel Shepherd, [place] [facility] N Other Clinician: Referring Provider: Treating Provider/Extender: Jacklynn Ganong, [place] [facility] N Weeks in Treatment: 21 Subjective Chief Complaint Information obtained from Patient Bilateral LE Ulcers 10/05/2022: RLE ulcer History of Present Illness (HPI) 07/09/18 on evaluation today patient presents for bilateral lower extremity ulcers. She has a past medical history significant for chronic venous stasis, hypertension, and lower extremity edema with associated skin changes. Unfortunately she tells me that roughly 3 weeks ago she noted ulcers on the left lower extremity which started given her trouble. Subsequently she ended up proceeding with applying mupirocin ointment and was keeping this open air for the most part. She states that it would scab over somewhat and then reopened. The wounds have been training quite significantly. Fortunately there does not appear to be any evidence of overt infection there is some erythematous type skin  changes but I believe this is likely stasis dermatitis nothing more. Fortunately she has no evidence of any systemic infection. No fevers, chills, nausea, or vomiting noted at this time. She has been recommended before to wear compression stockings but she does not wear them on a regular basis. She has never been suggested to get Juxta-Lite compression wraps she was not aware of what these were. Patient had venous studies performed on 11/09/17 which showed no lower surety DVT or deep vein insufficiency noted. She did not have evaluation however of the superficial veins. There was full compressibility noted throughout. Her arterial study which was performed on 11/18/17 actually showed that she has an ABI on the right of 1.17 with a TBI of 1.59 along with an ABI of 1.12 on the left and a TBI of 0.99. 07/17/18 on evaluation today patient appears to be doing much better in regard to her left lower Trinity ulcers. She's been tolerating the dressing changes without complication. There does not appear to be any evidence of infection which is good news. Overall I'm very pleased with the progress at this time. 07/24/18 on evaluation today patient's wound actually appears to be completely healed at this time. Fortunately there does not appear to be any evidence of infection which is good news. Overall I'm very happy with the progress that has been made. She did get her Velcro compression wraps. READMISSION 12/31/2020 This is a patient we had in this clinic in 2019 felt to have chronic venous insufficiency wounds on her lower extremities. She was discharged with bilateral compression stockings. She tells [place] that she never really wore them or at best wore them sparingly. She is back in clinic today having a ulcer on her left lateral lower extremity since October and one on the right anterior for the last 2 weeks. I do not know that she is doing any primary dressing on these certainly not wearing any form of  compression. She is already been evaluated by vein and vascular on 11/15/2020. They noted that she did have ulcers on the left leg in October  that healed with a need Unna boot. She had a reflux study on 12/20 that did not show evidence of a DVT or venous reflux bilaterally. They was not felt that she had arterial insufficiency based on the fact that she had brisk biphasic Doppler signals in the bilateral DP PT. The patient comes in today with superficial wounds on the right anterior mid tibia and the left lateral calf. These are small appear to have healthy surfaces. Past medical history includes cervical and upper thoracic spine surgery on 7/21, hernia repair early in January, hypertension, hyperlipidemia, hypothyroidism, gastric bypass surgery, DVT in the right peroneal artery and left popliteal and peroneal veins. Next problem CHF. She does not have diabetes ABIs in our clinic were 1.1 on the right and 1.2 on the lower 2/11; the patient's wound on the left lateral and right anterior lower leg are closed for edema control is good. She has an assortment of old juxta lite stockings with old liners. She does not like to wear anything in particular under the compression part of the juxta lite stocking. I think she is going to do exactly what she wants. READMISSION 10/05/2022 This is a 77 year old woman last seen in our clinic about 18 months ago. She has a history of venous stasis and stasis dermatitis. A reflux study done in 2021 did not show any evidence of venous reflux. She says about 3 weeks ago, she noticed an ulcer on her right lateral malleolus. She was applying triple antibiotic ointment to it for a while and then was soaking it in Epsom salts. She reports that she usually wears compression stockings, but that they were rubbing so she did not wear 1 on her right leg since the wound appeared, she has a stocking with the toe and heel cut out applied to her left leg. On examination, she has  changes of chronic stasis dermatitis on the right. There is a shallow ulcer overlying the lateral malleolus. The fat layer is exposed. It JALIAH, FOODY ([id number]) [id number] Page 6 of 11 is a little bit desiccated but fairly clean with just some light eschar and slough present. No malodor or purulent drainage. 10/13/2022: The wound is a little smaller today. Although the moisture balance is better than last week, it is still a bit dry. There is still some slough accumulation on the surface. 10/25/2022: The wound continues to contract. There is still thick layer of slough on the surface. The moisture balance of the wound is better, but the periwound skin is slightly macerated. 11/02/2022: The wound is smaller and shallower today. There is still slough on the surface, but the periwound skin is intact. The wound surface is still drier than ideal. 12/15; wound is about the same size minimal depth. Under illumination some debris on the surface she has been using Prisma 11/16/2022: The leg wrap became saturated when her makeshift cast protector leaked. The wound is a little bit larger today, as result of moisture-related tissue breakdown. The surface remains fairly fibrotic. 11/30/2022: The wound measured a little bit larger today secondary to additional moisture related periwound breakdown. There is slough on the wound surface. Still fairly fibrotic underneath. 12/05/2022: The ankle wound is a little smaller but still has slough accumulation. She has been approved for Apligraf and we are going to apply that today. 12/15/2022: The wound is smaller again today. Minimal slough. Edema control is good. 12/29/2022: The wound continues to contract. The surface is a little bit less fibrotic today. Light slough accumulation.  01/12/2023: The wound is smaller and has a better fill of granulation tissue. There is just a little slough on the surface and a small remaining area in the  center that is still fibrotic. 01/26/2023: The wound measured larger for some reason today, but on visual inspection it appears about the same size. There is more granulation tissue and just a very small area in the center that remains fibrotic. Minimal slough. 02/15/2023: The wound on her right lateral ankle is a little bit smaller and shallower today. The surface remains tough but less fibrotic than on initial intake. She has a wound on her left anterior tibia that she says has been present for about a month. She has been trying to manage it on her own at home but as it has not improved, she wanted [place] to take over management of this site. It is fairly superficial, essentially a deep scrape. There is some slough on the surface. No concern for infection. 02/23/2023: The wound on her left anterior tibial surface is nearly closed underneath the layer of eschar. The right lateral ankle wound is about the same size but a little bit shallower today. She says it has been more tender over the week and indeed, there is some erythema and swelling present. 03/02/2023: The wound on her left anterior tibia is closed. The right lateral ankle wound is shallower with a more healthy-looking surface. Patient History Information obtained from Patient. Family History Cancer - Mother,Father, Diabetes - Father, Heart Disease - Father, Hypertension - Father, Stroke - Mother, No family history of Hereditary Spherocytosis, Kidney Disease, Lung Disease, Seizures, Thyroid Problems, Tuberculosis. Social History Never smoker, Marital Status - Married, Alcohol Use - Daily - 1.5 bottles of wine daily, Drug Use - No History, Caffeine Use - Daily - coffee. Medical History Eyes Patient has history of Cataracts - mild Denies history of Glaucoma, Optic Neuritis Ear/Nose/Mouth/Throat Denies history of Chronic sinus problems/congestion, Middle ear problems Hematologic/Lymphatic Patient has history of Anemia - iron deficiency,  Lymphedema Denies history of Hemophilia, Human Immunodeficiency Virus, Sickle Cell Disease Respiratory Denies history of Aspiration, Asthma, Chronic Obstructive Pulmonary Disease (COPD), Pneumothorax, Sleep Apnea, Tuberculosis Cardiovascular Patient has history of Congestive Heart Failure, Hypertension, Peripheral Venous Disease Denies history of Angina, Arrhythmia, Coronary Artery Disease, Deep Vein Thrombosis, Hypotension, Myocardial Infarction, Peripheral Arterial Disease, Phlebitis, Vasculitis Gastrointestinal Denies history of Cirrhosis , Colitis, Crohnoos, Hepatitis A, Hepatitis B, Hepatitis C Endocrine Denies history of Type I Diabetes, Type II Diabetes Genitourinary Denies history of End Stage Renal Disease Immunological Denies history of Lupus Erythematosus, Raynaudoos, Scleroderma Integumentary (Skin) Denies history of History of Burn Musculoskeletal Patient has history of Osteoarthritis Denies history of Gout, Rheumatoid Arthritis, Osteomyelitis Neurologic Patient has history of Neuropathy - bila Denies history of Dementia, Quadriplegia, Paraplegia, Seizure Disorder Oncologic Denies history of Received Chemotherapy, Received Radiation Psychiatric Patient has history of Confinement Anxiety Denies history of Anorexia/bulimia Hospitalization/Surgery History - right arm surgery. - gastric bypass. - hysterectomy. - tonsillectomy. - hernia repair. - cervical myelopathy with spinal cord compression. [name], [name] ([id number]) [id number] Page 7 of 11 Medical A Surgical History Notes nd Constitutional Symptoms (General Health) obesity Ear/Nose/Mouth/Throat seasonal allergies Cardiovascular aortic murmur, hyperlipidemia, hypertriglyceridemia Gastrointestinal UGI bleed, Endocrine hypothyroidism Objective Constitutional Hypertensive, asymptomatic. no acute distress. Vitals Time Taken: 12:46 PM, Height: 65 in, Weight: 229 lbs, BMI: 38.1,  Temperature: 97.9 F, Pulse: 67 bpm, Respiratory Rate: 18 breaths/min, Blood Pressure: 170/94 mmHg. Respiratory Normal work of breathing on room air. General Notes: 03/02/2023: The wound  on her left anterior tibia is closed. The right lateral ankle wound is shallower with a more healthy-looking surface. Integumentary (Hair, Skin) Wound #6 status is Open. Original cause of wound was Gradually Appeared. The date acquired was: 09/25/2022. The wound has been in treatment 21 weeks. The wound is located on the Right,Lateral Ankle. The wound measures 0.9cm length x 0.9cm width x 0.1cm depth; 0.636cm^2 area and 0.064cm^3 volume. There is Fat Layer (Subcutaneous Tissue) exposed. There is no tunneling or undermining noted. There is a medium amount of serous drainage noted. The wound margin is flat and intact. There is large (67-100%) red, pink granulation within the wound bed. There is a small (1-33%) amount of necrotic tissue within the wound bed including Adherent Slough. The periwound skin appearance had no abnormalities noted for texture. The periwound skin appearance exhibited: Hemosiderin Staining. The periwound skin appearance did not exhibit: Dry/Scaly, Maceration, Erythema. Periwound temperature was noted as No Abnormality. The periwound has tenderness on palpation. Wound #8 status is Healed - Epithelialized. Original cause of wound was Trauma. The date acquired was: 01/25/2023. The wound has been in treatment 2 weeks. The wound is located on the Left,Anterior Lower Leg. The wound measures 0cm length x 0cm width x 0cm depth; 0cm^2 area and 0cm^3 volume. There is no tunneling or undermining noted. There is a none present amount of drainage noted. There is no granulation within the wound bed. There is no necrotic tissue within the wound bed. The periwound skin appearance had no abnormalities noted for texture. The periwound skin appearance had no abnormalities noted for moisture. The periwound skin  appearance had no abnormalities noted for color. 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 There was a Selective/Open Wound Non-Viable Tissue Debridement with a total area of 0.81 sq cm performed by Duanne Guess, MD. With the following instrument(s): Curette to remove Non-Viable tissue/material. Material removed includes [facility] after achieving pain control using Lidocaine 4% Topical Solution. No specimens were taken. A time out was conducted at 13:05, prior to the start of the procedure. A Minimum amount of bleeding was controlled with Pressure. The procedure was tolerated well with a pain level of 2 throughout and a pain level of 1 following the procedure. Post Debridement Measurements: 0.9cm length x 0.9cm width x 0.1cm depth; 0.064cm^3 volume. Character of Wound/Ulcer Post Debridement is improved. Severity of Tissue Post Debridement is: Fat layer exposed. Post procedure Diagnosis Wound #6: Same as Pre-Procedure General Notes: scribed for Dr. Lady Gary by Zenaida Deed, RN. Pre-procedure diagnosis of Wound #6 is a Venous Leg Ulcer located on the Right,Lateral Ankle . There was a Double Layer Compression Therapy Procedure by Zenaida Deed, RN. Post procedure Diagnosis Wound #6: Same as Pre-Procedure Notes: Urgo. [name], [name] ([id number]) [id number] Page 8 of 11 Plan Follow-up Appointments: Return Appointment in 1 week. - Dr. Lady Gary RM 2 Friday 4/12 @ 12:30 pm Anesthetic: (In clinic) Topical Lidocaine 4% applied to  wound bed Bathing/ Shower/ Hygiene: May shower with protection but do not get wound dressing(s) wet. Protect dressing(s) with water repellant cover (for example, large plastic bag) or a cast cover and may then  take shower. - may purchase cast protector at [facility], [facility] or [employer] Edema Control - Lymphedema / SCD / Other: Elevate legs to the level of the heart or above for 30 minutes daily and/or when sitting for 3-4 times a day throughout the day. Avoid standing for long periods of time. Patient to wear own compression stockings every day. Moisturize legs daily. WOUND #6: - Ankle Wound Laterality: Right, Lateral Cleanser: Soap and Water 1 x Per Week/30 Days Discharge Instructions: May shower and wash wound with dial antibacterial soap and water prior to dressing change. Peri-Wound Care: Sween Lotion (Moisturizing lotion) 1 x Per Week/30 Days Discharge Instructions: Apply moisturizing lotion as directed Topical: Mupirocin Ointment 1 x Per Week/30 Days Discharge Instructions: Apply Mupirocin (Bactroban) as instructed Prim Dressing: Promogran Prisma Matrix, 4.34 (sq in) (silver collagen) 1 x Per Week/30 Days ary Discharge Instructions: Moisten collagen with saline or hydrogel Secondary Dressing: Woven Gauze Sponge, Non-Sterile 4x4 in 1 x Per Week/30 Days Discharge Instructions: Apply over primary dressing as directed. Secured With: Transpore Surgical T ape, 2x10 (in/yd) 1 x Per Week/30 Days Discharge Instructions: Secure dressing with tape as directed. Com pression Wrap: Urgo K2, two layer compression system, regular 1 x Per Week/30 Days Discharge Instructions: Apply Urgo K2 as directed (alternative to 4 layer compression). 03/02/2023: The wound on her left anterior tibia is closed. The right lateral ankle wound is shallower with a more healthy-looking surface. I used a curette to debride slough from the wound surface. We will continue topical gentamicin with Prisma silver collagen and 3 layer  compression. She will follow-up in 1 week. Electronic Signature(s) Signed: 03/02/2023 1:27:24 PM By: Duanne Guess MD FACS Entered By: Duanne Guess on 03/02/2023 13:27:24 -------------------------------------------------------------------------------- HxROS Details Patient Name: Date of Service: [name], [name]. 03/02/2023 12:30 PM Medical Record Number: [id number] Patient Account Number: [id number] Date of Birth/Sex: Treating RN: 24-Jun-1946 (77 y.o. Tommye Standard Primary Care Provider: Jerel Shepherd, [place] [facility] N Other Clinician: Referring Provider: Treating Provider/Extender: Jacklynn Ganong, [place] [facility] Weeks in Treatment: 21 Information Obtained From Patient Constitutional Symptoms (General Health) Medical History: Past Medical History Notes: obesity Eyes Medical History: Positive for: Cataracts - mild Negative for: Glaucoma; Optic Neuritis Ear/Nose/Mouth/Throat Medical History: Negative for: Chronic sinus problems/congestion; Middle ear problems Past Medical History Notes: seasonal allergies [name], [name] ([id number]) [id number] Page 9 of 11 Hematologic/Lymphatic Medical History: Positive for: Anemia - iron deficiency; Lymphedema Negative for: Hemophilia; Human Immunodeficiency Virus; Sickle Cell Disease Respiratory Medical History: Negative for: Aspiration; Asthma; Chronic Obstructive Pulmonary Disease (COPD); Pneumothorax; Sleep Apnea; Tuberculosis Cardiovascular Medical History: Positive for: Congestive Heart Failure; Hypertension; Peripheral Venous Disease Negative for: Angina; Arrhythmia; Coronary Artery Disease; Deep Vein Thrombosis; Hypotension; Myocardial Infarction; Peripheral Arterial Disease; Phlebitis; Vasculitis Past Medical History Notes: aortic murmur, hyperlipidemia, hypertriglyceridemia Gastrointestinal Medical History: Negative for: Cirrhosis ; Colitis; Crohns; Hepatitis A; Hepatitis B; Hepatitis C Past  Medical History Notes: UGI bleed, Endocrine Medical History: Negative for: Type I Diabetes; Type II Diabetes Past Medical History Notes: hypothyroidism Genitourinary Medical History: Negative for: End Stage Renal Disease Immunological Medical History: Negative for: Lupus Erythematosus; Raynauds; Scleroderma Integumentary (Skin) Medical History: Negative for: History of Burn Musculoskeletal Medical History: Positive for: Osteoarthritis Negative for: Gout; Rheumatoid Arthritis; Osteomyelitis Neurologic Medical History: Positive for: Neuropathy - bila Negative for: Dementia; Quadriplegia; Paraplegia; Seizure Disorder Oncologic Medical History: Negative for: Received Chemotherapy; Received Radiation Psychiatric Medical History: Positive for: Confinement Anxiety Negative for: Anorexia/bulimia HBO Extended History Items Eyes: Cataracts Immunizations [name], [name]  [name] ([id number]) [id number] Page 10 of 11 Pneumococcal Vaccine: Received Pneumococcal Vaccination: No Implantable Devices No devices added Hospitalization / Surgery History Type of Hospitalization/Surgery right arm surgery gastric bypass hysterectomy tonsillectomy hernia repair cervical myelopathy with spinal cord compression Family and Social History Cancer: Yes - Mother,Father; Diabetes: Yes - Father; Heart Disease: Yes - Father; Hereditary Spherocytosis: No; Hypertension: Yes - Father; Kidney Disease: No; Lung Disease: No; Seizures: No; Stroke: Yes - Mother; Thyroid Problems: No; Tuberculosis: No; Never smoker; Marital Status - Married; Alcohol Use: Daily - 1.5 bottles of wine daily; Drug Use: No History; Caffeine Use: Daily - coffee; Financial Concerns: No; Food, Clothing or Shelter Needs: No; Support System Lacking: No; Transportation Concerns: No Electronic Signature(s) Signed: 03/02/2023 1:50:50 PM By: Duanne Guessannon, Sukhman Kocher MD FACS Signed: 03/05/2023 4:22:17 PM By: Zenaida DeedBoehlein, Linda  RN, BSN Entered By: Duanne Guessannon, Ericia Moxley on 03/02/2023 13:26:14 -------------------------------------------------------------------------------- SuperBill Details Patient Name: Date of Service: [name] HaddockMCCRA CKEN, Angla [name]. 03/02/2023 Medical Record Number: [id number] Patient Account Number: [id number] Date of Birth/Sex: Treating RN: [birth year] (77 y.o. [name]) [name], [name] Primary Care Provider: Jerel ShepherdEDSTRO M, [place] [facility] Other Clinician: Referring Provider: Treating Provider/Extender: Jacklynn Ganongannon, [name] Tessmer EDSTRO M, [place] [facility] Weeks in Treatment: 21 Diagnosis Coding ICD-10 Codes Code Description [phone] Non-pressure chronic ulcer of right ankle with fat layer exposed L97.821 Non-pressure chronic ulcer of other part of left lower leg limited to breakdown of skin I87.2 Venous insufficiency (chronic) (peripheral) I50.32 Chronic diastolic (congestive) heart failure E66.09 Other obesity due to excess calories Facility Procedures : CPT4 Code: [id number] Description: 97597 - DEBRIDE WOUND 1ST 20 SQ CM OR < ICD-10 Diagnosis Description L97.312 Non-pressure chronic ulcer of right ankle with fat layer exposed Modifier: Quantity: 1 Physician Procedures : CPT4 Code Description Modifier [id number] - WC PHYS LEVEL 4 - EST PT 25 ICD-10 Diagnosis Description L97.312 Non-pressure chronic ulcer of right ankle with fat layer exposed I87.2 Venous insufficiency (chronic) (peripheral) I50.32 Chronic diastolic  (congestive) heart failure E66.09 Other obesity due to excess calories Quantity: 1 : [id number] 97597 - WC PHYS DEBR WO ANESTH 20 SQ CM ICD-10 Diagnosis Description L97.312 Non-pressure chronic ulcer of right ankle with fat layer exposed Quantity: 1 Electronic Signature(s) Lynam, [place] [name] ([id number]) [id number] Page 11 of 11 Signed: 03/02/2023 1:27:46 PM By: Duanne Guessannon, Clorene Nerio MD FACS Entered By: Duanne Guessannon, Chesley Valls on 03/02/2023 13:27:46

## 2023-03-05 NOTE — Assessment & Plan Note (Signed)
Pleasant 77 year old female, known knee osteoarthritis, last aspiration and injection was bilateral December 18, 2022. She had an increase in swelling but this resolved on its own. On exam she does have what feels more to be lymphedema more so than effusion. Slightly erythematous, we did a course of doxycycline last time, I would like her to have another course of doxycycline as well as some prednisone on hand in case she needs it as they do have a trip coming up to the [place] in a couple of weeks.

## 2023-03-05 NOTE — Progress Notes (Signed)
[name], [name] ([id number]) [id number] Page 1 of 8 Visit Report for 03/02/2023 Arrival Information Details Patient Name: Date of Service: [name], [name]. 03/02/2023 12:30 PM Medical Record Number: [id number] Patient Account Number: [id number] Date of Birth/Sex: Treating RN: 1946-07-09 (77 y.o. [name]) Boehlein, Linda Primary Care Lorea Kupfer: [facility] ShepherdEDSTRO M[facility] [place] [facility] Other Clinician: Referring [name]: Treating Katrianna Friesenhahn/Extender: Jacklynn Ganongannon, Jennifer [place]STR[place], [place] [facility] N Weeks in Treatment: 21 Visit Information History Since Last Visit Added or deleted any medications: No Patient Arrived: Cane Any [name] allergies or adverse reactions: No Arrival Time: 12:40 Had a fall or experienced change in No Accompanied By: spouse activities of daily living that may affect Transfer Assistance: None risk of falls: Patient Identification Verified: Yes Signs or symptoms of abuse/neglect since last visito No Secondary Verification Process Completed: Yes Hospitalized since last visit: No Patient Requires Transmission-Based Precautions: No Implantable device outside of the clinic excluding No Patient Has Alerts: No cellular tissue based products placed in the center since last visit: Has Dressing in Place as Prescribed: Yes Has Compression in Place as Prescribed: Yes Pain Present Now: Yes Electronic Signature([name]) Signed: 03/05/2023 4:22:17 PM By: Zenaida DeedBoehlein, Linda RN, BSN Entered By: Zenaida DeedBoehlein, Linda on 03/02/2023 12:43:19 -------------------------------------------------------------------------------- Compression Therapy Details Patient Name: Date of Service: [name], [name]. 03/02/2023 12:30 PM Medical Record Number: [id number] Patient Account Number: [id number] Date of Birth/Sex: Treating RN: 1946-07-09 (77 y.o. [name]) Boehlein, Linda Primary Care Amanada Philbrick: Je[facility] ShepherdEDSTRO M, [place] [facility] Other Clinician: Referring Thursa Emme: Treating Chaden Doom/Extender: Jacklynn Ganongannon, Jennifer EDSTR[place], [place] [facility] N Weeks in  Treatment: 21 Compression Therapy Performed for Wound Assessment: Wound #6 Right,Lateral Ankle Performed By: Clinician Zenaida DeedBoehlein, Linda, RN Compression Type: Double Layer Post Procedure Diagnosis Same as Pre-procedure Notes Urgo Electronic Signature([name]) Signed: 03/05/2023 4:22:17 PM By: Zenaida DeedBoehlein, Linda RN, BSN Entered By: Zenaida DeedBoehlein, Linda on 03/02/2023 13:02:47 -------------------------------------------------------------------------------- Encounter Discharge Information Details Patient Name: Date of Service: [name], [name]. 03/02/2023 12:30 PM Medical Record Number: [id number] Patient Account Number: [id number] Date of Birth/Sex: Treating RN: 1946-07-09 (77 y.o. [name]) Boehlein, Linda Primary Care Carold Eisner: Jerel ShepherdEDSTRO M, [place] [facility] Other Clinician: Referring Bentlee Benningfield: Treating Lilou Kneip/Extender: Jacklynn Ganongannon, Jennifer [place], [place] [facility] N Weeks in Treatment: 21 Encounter Discharge Information Items Post Procedure Vitals Discharge Condition: Stable Temperature (F): 97.9 [name], [name] ([id number]) [id number] Page 2 of 8 Ambulatory Status: Cane Pulse (bpm): 67 Discharge Destination: Home Respiratory Rate (breaths/min): 18 Transportation: Private Auto Blood Pressure (mmHg): 170/94 Accompanied By: spouse Schedule Follow-up Appointment: Yes Clinical Summary of Care: Patient Declined Electronic Signature([name]) Signed: 03/05/2023 4:22:17 PM By: Zenaida DeedBoehlein, Linda RN, BSN Entered By: Zenaida DeedBoehlein, Linda on 03/02/2023 13:29:03 -------------------------------------------------------------------------------- Lower Extremity Assessment Details Patient Name: [name] [name]. 03/02/2023 12:30 PM Medical Record Number: [id number] Patient Account Number: [id number] Date of Birth/Sex: Treating RN: 1946-07-09 (77 y.o. [name]) Boehlein, Linda Primary Care Birney Belshe: Jerel ShepherdEDSTRO M, [place] [facility] Other Clinician: Referring Kacie Huxtable: Treating Mitzi Lilja/Extender: Jacklynn Ganongannon, Jennifer EDSTRO [place],  [place] [facility] N Weeks in Treatment: 21 Edema Assessment Assessed: [Left: No] [Right: No] Edema: [Left: Yes] [Right: Yes] Calf Left: Right: Point of Measurement: From Medial Instep 34 cm 32 cm Ankle Left: Right: Point of Measurement: From Medial Instep 21 cm 21 cm Vascular Assessment Pulses: Dorsalis Pedis Palpable: [Left:Yes] [Right:Yes] Electronic Signature([name]) Signed: 03/05/2023 4:22:17 PM By: Zenaida DeedBoehlein, Linda RN, BSN Entered By: Zenaida DeedBoehlein, Linda on 03/02/2023 12:51:59 -------------------------------------------------------------------------------- Multi Wound Chart Details Patient Name: Date of Service: [name], [name]. 03/02/2023 12:30 PM Medical Record Number: [id number] Patient Account Number: [id number]  Date of Birth/Sex: Treating RN: 14-Dec-1945 (77 y.o. [name] Primary Care Soila Printup: Jerel Shepherd, [place] [facility] N Other Clinician: Referring Nathalia Wismer: Treating Yajaira Doffing/Extender: Jacklynn Ganong, [place] [facility] N Weeks in Treatment: 21 Vital Signs Height(in): 65 Pulse(bpm): 67 Weight(lbs): 229 Blood Pressure(mmHg): 170/94 Body Mass Index(BMI): 38.1 Temperature(F): 97.9 Respiratory Rate(breaths/min): 18 [6:Photos:] [N/A:N/A [id number] Page 3 of 8] Right, Lateral Ankle Left, Anterior Lower Leg N/A Wound Location: Gradually Appeared Trauma N/A Wounding Event: Venous Leg Ulcer Venous Leg Ulcer N/A Primary Etiology: Cataracts, Anemia, Lymphedema, Cataracts, Anemia, Lymphedema, N/A Comorbid History: Congestive Heart Failure, Congestive Heart Failure, Hypertension, Peripheral Venous Hypertension, Peripheral Venous Disease, Osteoarthritis, Neuropathy, Disease, Osteoarthritis, Neuropathy, Confinement Anxiety Confinement Anxiety 09/25/2022 01/25/2023 N/A Date Acquired: 21 2 N/A Weeks of Treatment: Open Healed - Epithelialized N/A Wound Status: No No N/A Wound Recurrence: 0.9x0.9x0.1 0x0x0 N/A Measurements L x W x D (cm) 0.636 0 N/A A (cm)  : rea 0.064 0 N/A Volume (cm) : 38.70% 100.00% N/A % Reduction in A rea: 38.50% 100.00% N/A % Reduction in Volume: Full Thickness Without Exposed Full Thickness Without Exposed N/A Classification: Support Structures Support Structures Medium None Present N/A Exudate A mount: Serous N/A N/A Exudate Type: amber N/A N/A Exudate Color: Flat and Intact N/A N/A Wound Margin: Large (67-100%) None Present (0%) N/A Granulation A mount: Red, Pink N/A N/A Granulation Quality: Small (1-33%) None Present (0%) N/A Necrotic A mount: Fat Layer (Subcutaneous Tissue): Yes Fascia: No N/A Exposed Structures: Fascia: No Fat Layer (Subcutaneous Tissue): No Tendon: No Tendon: No Muscle: No Muscle: No Joint: No Joint: No Bone: No Bone: No Small (1-33%) Large (67-100%) N/A Epithelialization: Debridement - Selective/Open Wound N/A N/A Debridement: Pre-procedure Verification/Time Out 13:05 N/A N/A Taken: Lidocaine 4% Topical Solution N/A N/A Pain Control: Slough N/A N/A Tissue Debrided: Non-Viable Tissue N/A N/A Level: 0.81 N/A N/A Debridement A (sq cm): rea Curette N/A N/A Instrument: Minimum N/A N/A Bleeding: Pressure N/A N/A Hemostasis A chieved: 2 N/A N/A Procedural Pain: 1 N/A N/A Post Procedural Pain: Procedure was tolerated well N/A N/A Debridement Treatment Response: 0.9x0.9x0.1 N/A N/A Post Debridement Measurements L x W x D (cm) 0.064 N/A N/A Post Debridement Volume: (cm) No Abnormalities Noted No Abnormalities Noted N/A Periwound Skin Texture: Maceration: No No Abnormalities Noted N/A Periwound Skin Moisture: Dry/Scaly: No Hemosiderin Staining: Yes No Abnormalities Noted N/A Periwound Skin Color: Erythema: No No Abnormality N/A N/A Temperature: Yes N/A N/A Tenderness on Palpation: Compression Therapy N/A N/A Procedures Performed: Debridement Treatment Notes Electronic Signature([name]) Signed: 03/02/2023 1:25:28 PM By: Duanne Guess MD FACS Signed:  03/05/2023 4:22:17 PM By: Zenaida Deed RN, BSN Entered By: Duanne Guess on 03/02/2023 13:25:28 -------------------------------------------------------------------------------- Multi-Disciplinary Care Plan Details Patient Name: Date of Service: [name], [name] [place] [name]. 03/02/2023 12:30 PM Medical Record Number: [id number] Patient Account Number: [id number] Date of Birth/Sex: Treating RN: Oct 06, 1946 (77 y.o. [name] Primary Care Toshiko Kemler: [place]rel Shepherd, [place] [facility] N Other Clinician: Referring Daylynn Stumpp: Treating Demitris Pokorny/Extender: Jacklynn Ganong, [place] [facility] [place], Lorrane [name] ([id number]) [id number] Page 4 of 8 Weeks in Treatment: 21 Multidisciplinary Care Plan reviewed with physician Active Inactive Nutrition Nursing Diagnoses: Potential for alteratiion in Nutrition/Potential for imbalanced nutrition Goals: Patient/caregiver agrees to and verbalizes understanding of need to use nutritional supplements and/or vitamins as prescribed Date Initiated: 10/13/2022 Target Resolution Date: 03/30/2023 Goal Status: Active Interventions: Provide education on nutrition Treatment Activities: Dietary management education, guidance and counseling : 10/13/2022 Education provided on Nutrition : 11/10/2022 Notes: Venous Leg Ulcer Nursing Diagnoses: Knowledge  deficit related to disease process and management Potential for venous Insuffiency (use before diagnosis confirmed) Goals: Patient will maintain optimal edema control Date Initiated: 03/02/2023 Target Resolution Date: 03/30/2023 Goal Status: Active Interventions: Assess peripheral edema status every visit. Compression as ordered Provide education on venous insufficiency Treatment Activities: Therapeutic compression applied : 03/02/2023 Notes: Electronic Signature([name]) Signed: 03/05/2023 4:22:17 PM By: Zenaida Deed RN, BSN Entered By: Zenaida Deed on 03/02/2023  13:01:51 -------------------------------------------------------------------------------- Pain Assessment Details Patient Name: Date of Service: [name], [name] Bray [name]. 03/02/2023 12:30 PM Medical Record Number: [id number] Patient Account Number: [id number] Date of Birth/Sex: Treating RN: [birth year] (77 y.o. [name] Primary Care Makani Seckman: Jerel Shepherd, [place] [facility] N Other Clinician: Referring Abundio Teuscher: Treating Sargent Mankey/Extender: Jacklynn Ganong, [place] RGA N Weeks in Treatment: 21 Active Problems Location of Pain Severity and Description of Pain Patient Has Paino Yes Site Locations Pain LocationARAYLA, SAZAMA ([id number]) [id number] Page 5 of 8 Pain Location: Generalized Pain With Dressing Change: No Duration of the Pain. Constant / Intermittento Intermittent Rate the pain. Current Pain Level: 0 Worst Pain Level: 5 Least Pain Level: 0 Character of Pain Describe the Pain: Sharp, Throbbing Pain Management and Medication Current Pain Management: Medication: Yes Is the Current Pain Management Adequate: Adequate How does your wound impact your activities of daily livingo Sleep: No Bathing: No Appetite: No Relationship With Others: No Bladder Continence: No Emotions: No Bowel Continence: No Work: No Toileting: No Drive: No Dressing: No Hobbies: No Notes reports intermittant pain right ankle Electronic Signature([name]) Signed: 03/05/2023 4:22:17 PM By: Zenaida Deed RN, BSN Entered By: Zenaida Deed on 03/02/2023 12:44:25 -------------------------------------------------------------------------------- Patient/Caregiver Education Details Patient Name: Date of Service: [name], [name] 4/5/2024andnbsp12:30 PM Medical Record Number: [id number] Patient Account Number: [id number] Date of Birth/Gender: Treating RN: 10-01-1946 (77 y.o. [name] Primary Care Physician: Jerel Shepherd, [place] [facility] N Other Clinician: Referring  Physician: Treating Physician/Extender: Jacklynn Ganong, [place] R[facility] N Weeks in Treatment: 21 Education Assessment Education Provided To: Patient Education Topics Provided Venous: Methods: Explain/Verbal Responses: Reinforcements needed, State content correctly Wound/Skin Impairment: Methods: Explain/Verbal Responses: Reinforcements needed, State content correctly Electronic Signature([name]) Signed: 03/05/2023 4:22:17 PM By: Zenaida Deed RN, BSN Entered By: Zenaida Deed on 03/02/2023 13:02:13 [name] Barrios ([id number]) [id number] Page 6 of 8 -------------------------------------------------------------------------------- Wound Assessment Details Patient Name: Date of Service: [name], [name] 03/02/2023 12:30 PM Medical Record Number: [id number] Patient Account Number: [id number] Date of Birth/Sex: Treating RN: 07-27-1946 (77 y.o. [name] Primary Care Jaydi Bray: [place] Shepherd, [place] [facility] N Other Clinician: Referring Zen Cedillos: Treating Violett Hobbs/Extender: Jacklynn Ganong, [place] RGA N Weeks in Treatment: 21 Wound Status Wound Number: 6 Primary Venous Leg Ulcer Etiology: Wound Location: Right, Lateral Ankle Wound Open Wounding Event: Gradually Appeared Status: Date Acquired: 09/25/2022 Comorbid Cataracts, Anemia, Lymphedema, Congestive Heart Failure, Weeks Of Treatment: 21 History: Hypertension, Peripheral Venous Disease, Osteoarthritis, Clustered Wound: No Neuropathy, Confinement Anxiety Photos Wound Measurements Length: (cm) 0.9 Width: (cm) 0.9 Depth: (cm) 0.1 Area: (cm) 0.636 Volume: (cm) 0.064 % Reduction in Area: 38.7% % Reduction in Volume: 38.5% Epithelialization: Small (1-33%) Tunneling: No Undermining: No Wound Description Classification: Full Thickness Without Exposed Support Structures Wound Margin: Flat and Intact Exudate Amount: Medium Exudate Type: Serous Exudate Color: amber Foul Odor After  Cleansing: No Slough/Fibrino Yes Wound Bed Granulation Amount: Large (67-100%) Exposed Structure Granulation Quality: Red, Pink Fascia Exposed: No Necrotic Amount: Small (1-33%) Fat Layer (Subcutaneous Tissue) Exposed: Yes Necrotic Quality: Adherent Slough Tendon Exposed: No Muscle  Exposed: No Joint Exposed: No Bone Exposed: No Periwound Skin Texture Texture Color No Abnormalities Noted: Yes No Abnormalities Noted: No Erythema: No Moisture Hemosiderin Staining: Yes No Abnormalities Noted: No Dry / Scaly: No Temperature / Pain Maceration: No Temperature: No Abnormality Tenderness on Palpation: Yes Treatment Notes Wound #6 (Ankle) Wound Laterality: Right, Lateral Cleanser Soap and Water Discharge Instruction: May shower and wash wound with dial antibacterial soap and water prior to dressing change. AAVYA, SHAFER ([id number]) [id number] Page 7 of 8 Peri-Wound Care Sween Lotion (Moisturizing lotion) Discharge Instruction: Apply moisturizing lotion as directed Topical Mupirocin Ointment Discharge Instruction: Apply Mupirocin (Bactroban) as instructed Primary Dressing Promogran Prisma Matrix, 4.34 (sq in) (silver collagen) Discharge Instruction: Moisten collagen with saline or hydrogel Secondary Dressing Woven Gauze Sponge, Non-Sterile 4x4 in Discharge Instruction: Apply over primary dressing as directed. Secured With Transpore Surgical Tape, 2x10 (in/yd) Discharge Instruction: Secure dressing with tape as directed. Compression Wrap Urgo K2, two layer compression system, regular Discharge Instruction: Apply Urgo K2 as directed (alternative to 4 layer compression). Compression Stockings Add-Ons Electronic Signature([name]) Signed: 03/05/2023 4:22:17 PM By: Zenaida Deed RN, BSN Entered By: Zenaida Deed on 03/02/2023 12:59:14 -------------------------------------------------------------------------------- Wound Assessment Details Patient  Name: Date of Service: [name], [name] Bray [name]. 03/02/2023 12:30 PM Medical Record Number: [id number] Patient Account Number: [id number] Date of Birth/Sex: Treating RN: 06/22/1946 (77 y.o. [name] Primary Care Khalilah Hoke: Jerel Shepherd, [place] [facility] N Other Clinician: Referring Chudney Scheffler: Treating Farmer Mccahill/Extender: Jacklynn Ganong, [place] RGA N Weeks in Treatment: 21 Wound Status Wound Number: 8 Primary Venous Leg Ulcer Etiology: Wound Location: Left, Anterior Lower Leg Wound Healed - Epithelialized Wounding Event: Trauma Status: Date Acquired: 01/25/2023 Comorbid Cataracts, Anemia, Lymphedema, Congestive Heart Failure, Weeks Of Treatment: 2 History: Hypertension, Peripheral Venous Disease, Osteoarthritis, Clustered Wound: No Neuropathy, Confinement Anxiety Photos Wound Measurements Length: (cm) 0 Width: (cm) 0 Depth: (cm) 0 Area: (cm) 0 Volume: (cm) 0 Montagna, Altha [name] ([id number]) % Reduction in Area: 100% % Reduction in Volume: 100% Epithelialization: Large (67-100%) Tunneling: No Undermining: No [id number] Page 8 of 8 Wound Description Classification: Full Thickness Without Exposed Support Structures Exudate Amount: None Present Foul Odor After Cleansing: No Slough/Fibrino No Wound Bed Granulation Amount: None Present (0%) Exposed Structure Necrotic Amount: None Present (0%) Fascia Exposed: No Fat Layer (Subcutaneous Tissue) Exposed: No Tendon Exposed: No Muscle Exposed: No Joint Exposed: No Bone Exposed: No Periwound Skin Texture Texture Color No Abnormalities Noted: Yes No Abnormalities Noted: Yes Moisture No Abnormalities Noted: Yes Electronic Signature([name]) Signed: 03/05/2023 4:22:17 PM By: Zenaida Deed RN, BSN Entered By: Zenaida Deed on 03/02/2023 13:00:05 -------------------------------------------------------------------------------- Vitals Details Patient Name: Date of Service: [name], Carisa [name]. 03/02/2023 12:30  PM Medical Record Number: [id number] Patient Account Number: [id number] Date of Birth/Sex: Treating RN: February 14, 1946 (77 y.o. [name] Primary Care Glendy Barsanti: [place]rel Shepherd, [place] [facility] N Other Clinician: Referring Berlinda Farve: Treating Tammatha Cobb/Extender: Jacklynn Ganong, [place] RGA N Weeks in Treatment: 21 Vital Signs Time Taken: 12:46 Temperature (F): 97.9 Height (in): 65 Pulse (bpm): 67 Weight (lbs): 229 Respiratory Rate (breaths/min): 18 Body Mass Index (BMI): 38.1 Blood Pressure (mmHg): 170/94 Reference Range: 80 - 120 mg / dl Electronic Signature([name]) Signed: 03/05/2023 4:22:17 PM By: Zenaida Deed RN, BSN Entered By: Zenaida Deed on 03/02/2023 12:46:54

## 2023-03-08 ENCOUNTER — Other Ambulatory Visit: Payer: Self-pay | Admitting: Family Medicine

## 2023-03-08 ENCOUNTER — Other Ambulatory Visit: Payer: Self-pay | Admitting: Nurse Practitioner

## 2023-03-08 DIAGNOSIS — E039 Hypothyroidism, unspecified: Secondary | ICD-10-CM

## 2023-03-09 ENCOUNTER — Encounter (HOSPITAL_BASED_OUTPATIENT_CLINIC_OR_DEPARTMENT_OTHER): Payer: PPO | Admitting: General Surgery

## 2023-03-09 DIAGNOSIS — L97312 Non-pressure chronic ulcer of right ankle with fat layer exposed: Secondary | ICD-10-CM | POA: Diagnosis not present

## 2023-03-09 DIAGNOSIS — I872 Venous insufficiency (chronic) (peripheral): Secondary | ICD-10-CM | POA: Diagnosis not present

## 2023-03-09 NOTE — Progress Notes (Signed)
[name], [name] ([id number]) [id number] Page 1 of 11 Visit Report for 03/09/2023 Chief Complaint Document Details Patient Name: Date of Service: [name], [name] 03/09/2023 12:30 PM Medical Record Number: [id number] Patient Account Number: [id number] Date of Birth/Sex: Treating RN: [birth year] (77 y.o. F) Primary Care Provider: Jerel Shepherd, Tonie Griffith [facility] N Other Clinician: Referring Provider: Treating Provider/Extender: Jacklynn Ganong, [place] RGA N Weeks in Treatment: 22 Information Obtained from: Patient Chief Complaint Bilateral LE Ulcers 10/05/2022: RLE ulcer Electronic Signature(s) Signed: 03/09/2023 12:45:21 PM By: Duanne Guess MD FACS Entered By: Duanne Guess on 03/09/2023 12:45:21 -------------------------------------------------------------------------------- Debridement Details Patient Name: Date of Service: [name], [name] 03/09/2023 12:30 PM Medical Record Number: [id number] Patient Account Number: [id number] Date of Birth/Sex: Treating RN: 1946-01-06 (77 y.o. [name] Primary Care Provider: Jerel Shepherd, [place] [facility] N Other Clinician: Referring Provider: Treating Provider/Extender: Jacklynn Ganong, [place] RGA N Weeks in Treatment: 22 Debridement Performed for Assessment: Wound #6 Right,Lateral Ankle Performed By: Physician Duanne Guess, MD Debridement Type: Debridement Severity of Tissue Pre Debridement: Fat layer exposed Level of Consciousness (Pre-procedure): Awake and Alert Pre-procedure Verification/Time Out Yes - 12:41 Taken: Start Time: 12:41 Pain Control: Lidocaine 4% T opical Solution T Area Debrided (L x W): otal 0.7 (cm) x 0.7 (cm) = 0.49 (cm) Tissue and other material debrided: Non-Viable, Eschar, Slough, Slough Level: Non-Viable Tissue Debridement Description: Selective/Open Wound Instrument: Curette Bleeding: Minimum Hemostasis Achieved: Pressure Response to Treatment: Procedure was tolerated  well Level of Consciousness (Post- Awake and Alert procedure): Post Debridement Measurements of Total Wound Length: (cm) 0.7 Width: (cm) 0.7 Depth: (cm) 0.1 Volume: (cm) 0.038 Character of Wound/Ulcer Post Debridement: Improved Severity of Tissue Post Debridement: Fat layer exposed Post Procedure Diagnosis Same as Pre-procedure Notes scribed for Dr. Lady Gary by Samuella Bruin, RN Electronic Signature(s) Signed: 03/09/2023 2:25:12 PM By: Duanne Guess MD FACS Signed: 03/09/2023 3:45:26 PM By: Madelaine Etienne, [name] ([id number]) [email or web address].pdf Page 2 of 11 Signed: 03/09/2023 3:45:26 PM aylor Entered By: Samuella Bruin on 03/09/2023 12:43:42 -------------------------------------------------------------------------------- HPI Details Patient Name: Date of Service: [name], [name] 03/09/2023 12:30 PM Medical Record Number: [id number] Patient Account Number: [id number] Date of Birth/Sex: Treating RN: [birth year] (77 y.o. F) Primary Care Provider: Jerel Shepherd, Tonie Griffith [facility] N Other Clinician: Referring Provider: Treating Provider/Extender: Jacklynn Ganong, [place] RGA N Weeks in Treatment: 22 History of Present Illness HPI Description: 07/09/18 on evaluation today patient presents for bilateral lower extremity ulcers. She has a past medical history significant for chronic venous stasis, hypertension, and lower extremity edema with associated skin changes. Unfortunately she tells me that roughly 3 weeks ago she noted ulcers on the left lower extremity which started given her trouble. Subsequently she ended up proceeding with applying mupirocin ointment and was keeping this open air for the most part. She states that it would scab over somewhat and then reopened. The wounds have been training quite significantly. Fortunately there does not appear to be any evidence of overt infection there is some erythematous type skin changes  but I believe this is likely stasis dermatitis nothing more. Fortunately she has no evidence of any systemic infection. No fevers, chills, nausea, or vomiting noted at this time. She has been recommended before to wear compression stockings but she does not wear them on a regular basis. She has never been suggested to get Juxta-Lite compression wraps she was not aware of what these were. Patient had venous studies performed on 11/09/17 which showed  no lower surety DVT or deep vein insufficiency noted. She did not have evaluation however of the superficial veins. There was full compressibility noted throughout. Her arterial study which was performed on 11/18/17 actually showed that she has an ABI on the right of 1.17 with a TBI of 1.59 along with an ABI of 1.12 on the left and a TBI of 0.99. 07/17/18 on evaluation today patient appears to be doing much better in regard to her left lower Trinity ulcers. She's been tolerating the dressing changes without complication. There does not appear to be any evidence of infection which is good news. Overall I'm very pleased with the progress at this time. 07/24/18 on evaluation today patient's wound actually appears to be completely healed at this time. Fortunately there does not appear to be any evidence of infection which is good news. Overall I'm very happy with the progress that has been made. She did get her Velcro compression wraps. READMISSION 12/31/2020 This is a patient we had in this clinic in 2019 felt to have chronic venous insufficiency wounds on her lower extremities. She was discharged with bilateral compression stockings. She tells [place] that she never really wore them or at best wore them sparingly. She is back in clinic today having a ulcer on her left lateral lower extremity since October and one on the right anterior for the last 2 weeks. I do not know that she is doing any primary dressing on these certainly not wearing any form of compression. She  is already been evaluated by vein and vascular on 11/15/2020. They noted that she did have ulcers on the left leg in October that healed with a need Unna boot. She had a reflux study on 12/20 that did not show evidence of a DVT or venous reflux bilaterally. They was not felt that she had arterial insufficiency based on the fact that she had brisk biphasic Doppler signals in the bilateral DP PT. The patient comes in today with superficial wounds on the right anterior mid tibia and the left lateral calf. These are small appear to have healthy surfaces. Past medical history includes cervical and upper thoracic spine surgery on 7/21, hernia repair early in January, hypertension, hyperlipidemia, hypothyroidism, gastric bypass surgery, DVT in the right peroneal artery and left popliteal and peroneal veins. Next problem CHF. She does not have diabetes ABIs in our clinic were 1.1 on the right and 1.2 on the lower 2/11; the patient's wound on the left lateral and right anterior lower leg are closed for edema control is good. She has an assortment of old juxta lite stockings with old liners. She does not like to wear anything in particular under the compression part of the juxta lite stocking. I think she is going to do exactly what she wants. READMISSION 10/05/2022 This is a 77 year old woman last seen in our clinic about 18 months ago. She has a history of venous stasis and stasis dermatitis. A reflux study done in 2021 did not show any evidence of venous reflux. She says about 3 weeks ago, she noticed an ulcer on her right lateral malleolus. She was applying triple antibiotic ointment to it for a while and then was soaking it in Epsom salts. She reports that she usually wears compression stockings, but that they were rubbing so she did not wear 1 on her right leg since the wound appeared, she has a stocking with the toe and heel cut out applied to her left leg. On examination, she has changes  of chronic  stasis dermatitis on the right. There is a shallow ulcer overlying the lateral malleolus. The fat layer is exposed. It is a little bit desiccated but fairly clean with just some light eschar and slough present. No malodor or purulent drainage. 10/13/2022: The wound is a little smaller today. Although the moisture balance is better than last week, it is still a bit dry. There is still some slough accumulation on the surface. 10/25/2022: The wound continues to contract. There is still thick layer of slough on the surface. The moisture balance of the wound is better, but the periwound skin is slightly macerated. 11/02/2022: The wound is smaller and shallower today. There is still slough on the surface, but the periwound skin is intact. The wound surface is still drier than ideal. 12/15; wound is about the same size minimal depth. Under illumination some debris on the surface she has been using Prisma 11/16/2022: The leg wrap became saturated when her makeshift cast protector leaked. The wound is a little bit larger today, as result of moisture-related tissue breakdown. The surface remains fairly fibrotic. 11/30/2022: The wound measured a little bit larger today secondary to additional moisture related periwound breakdown. There is slough on the wound surface. Still fairly fibrotic underneath. 12/05/2022: The ankle wound is a little smaller but still has slough accumulation. She has been approved for Apligraf and we are going to apply that today. 12/15/2022: The wound is smaller again today. Minimal slough. Edema control is good. TAMISHA, NORDSTROM ([id number]) [id number] Page 3 of 11 12/29/2022: The wound continues to contract. The surface is a little bit less fibrotic today. Light slough accumulation. 01/12/2023: The wound is smaller and has a better fill of granulation tissue. There is just a little slough on the surface and a small remaining area in the center that is still  fibrotic. 01/26/2023: The wound measured larger for some reason today, but on visual inspection it appears about the same size. There is more granulation tissue and just a very small area in the center that remains fibrotic. Minimal slough. 02/15/2023: The wound on her right lateral ankle is a little bit smaller and shallower today. The surface remains tough but less fibrotic than on initial intake. She has a wound on her left anterior tibia that she says has been present for about a month. She has been trying to manage it on her own at home but as it has not improved, she wanted [place] to take over management of this site. It is fairly superficial, essentially a deep scrape. There is some slough on the surface. No concern for infection. 02/23/2023: The wound on her left anterior tibial surface is nearly closed underneath the layer of eschar. The right lateral ankle wound is about the same size but a little bit shallower today. She says it has been more tender over the week and indeed, there is some erythema and swelling present. 03/02/2023: The wound on her left anterior tibia is closed. The right lateral ankle wound is shallower with a more healthy-looking surface. 03/09/2023: The right lateral ankle wound has contracted and the surface is more viable-looking. There is some slough and eschar buildup. Edema control is excellent. Electronic Signature(s) Signed: 03/09/2023 12:46:00 PM By: Duanne Guess MD FACS Entered By: Duanne Guess on 03/09/2023 12:46:00 -------------------------------------------------------------------------------- Physical Exam Details Patient Name: Date of Service: [name], Stark Bray H. 03/09/2023 12:30 PM Medical Record Number: [id number] Patient Account Number: [id number] Date of Birth/Sex: Treating RN: 06-Apr-1946 (77 y.o. F) Primary Care  Provider: Jerel Shepherd, [place] [facility] N Other Clinician: Referring Provider: Treating Provider/Extender: Jacklynn Ganong, [place] RGA N Weeks  in Treatment: 22 Constitutional Hypertensive, asymptomatic. . . . no acute distress. Respiratory Normal work of breathing on room air. Notes 03/09/2023: The right lateral ankle wound has contracted and the surface is more viable-looking. There is some slough and eschar buildup. Edema control is excellent. Electronic Signature(s) Signed: 03/09/2023 12:46:35 PM By: Duanne Guess MD FACS Entered By: Duanne Guess on 03/09/2023 12:46:34 -------------------------------------------------------------------------------- Physician Orders Details Patient Name: Date of Service: [name], Stark Bray H. 03/09/2023 12:30 PM Medical Record Number: [id number] Patient Account Number: [id number] Date of Birth/Sex: Treating RN: Jul 25, 1946 (77 y.o. [name] Primary Care Provider: Jerel Shepherd, [place] [facility] N Other Clinician: Referring Provider: Treating Provider/Extender: Jacklynn Ganong, [place] RGA N Weeks in Treatment: 22 Verbal / Phone Orders: No Diagnosis Coding ICD-10 Coding Code Description L97.312 Non-pressure chronic ulcer of right ankle with fat layer exposed L97.821 Non-pressure chronic ulcer of other part of left lower leg limited to breakdown of skin I87.2 Venous insufficiency (chronic) (peripheral) I50.32 Chronic diastolic (congestive) heart failure E66.09 Other obesity due to excess calories Ledesma, Sharonann H ([id number]) [phone].pdf Page 4 of 11 Follow-up Appointments Return appointment in 3 weeks. - Dr. Lady Gary - room 2 Anesthetic (In clinic) Topical Lidocaine 4% applied to wound bed Bathing/ Shower/ Hygiene May shower with protection but do not get wound dressing(s) wet. Protect dressing(s) with water repellant cover (for example, large plastic bag) or a cast cover and may then take shower. - may purchase cast protector at [facility], [facility] or [employer] Edema Control - Lymphedema / SCD / Other Bilateral Lower Extremities Elevate legs to the level of  the heart or above for 30 minutes daily and/or when sitting for 3-4 times a day throughout the day. Avoid standing for long periods of time. Patient to wear own compression stockings every day. Moisturize legs daily. Wound Treatment Wound #6 - Ankle Wound Laterality: Right, Lateral Cleanser: Soap and Water 1 x Per Week/30 Days Discharge Instructions: May shower and wash wound with dial antibacterial soap and water prior to dressing change. Peri-Wound Care: Sween Lotion (Moisturizing lotion) 1 x Per Week/30 Days Discharge Instructions: Apply moisturizing lotion as directed Topical: Mupirocin Ointment 1 x Per Week/30 Days Discharge Instructions: Apply Mupirocin (Bactroban) as instructed Prim Dressing: Promogran Prisma Matrix, 4.34 (sq in) (silver collagen) 1 x Per Week/30 Days ary Discharge Instructions: Moisten collagen with saline or hydrogel Secondary Dressing: Woven Gauze Sponge, Non-Sterile 4x4 in 1 x Per Week/30 Days Discharge Instructions: Apply over primary dressing as directed. Secured With: Transpore Surgical Tape, 2x10 (in/yd) 1 x Per Week/30 Days Discharge Instructions: Secure dressing with tape as directed. Compression Wrap: Urgo K2, two layer compression system, regular 1 x Per Week/30 Days Discharge Instructions: Apply Urgo K2 as directed (alternative to 4 layer compression). Patient Medications llergies: cocoa, red dye, Lyrica, Sulfa (Sulfonamide Antibiotics) A Notifications Medication Indication Start End 03/09/2023 lidocaine DOSE topical 4 % cream - cream topical Electronic Signature(s) Signed: 03/09/2023 2:25:12 PM By: Duanne Guess MD FACS Entered By: Duanne Guess on 03/09/2023 12:46:48 -------------------------------------------------------------------------------- Problem List Details Patient Name: Date of Service: [name], Chaelyn H. 03/09/2023 12:30 PM Medical Record Number: [id number] Patient Account Number: [id number] Date of Birth/Sex: Treating  RN: 01-Sep-1946 (77 y.o. F) Primary Care Provider: [facility] N Other Clinician: Referring Provider: Treating Provider/Extender: Jacklynn Ganong, [place] RGA N Weeks in Treatment: 22 Active Problems ICD-10 Encounter Code  Description Active Date MDM Diagnosis L97.312 Non-pressure chronic ulcer of right ankle with fat layer exposed 10/05/2022 No Yes Pavao, Mellony H ([id number]) [phone].pdf Page 5 of 11 [phone] Non-pressure chronic ulcer of other part of left lower leg limited to breakdown 02/15/2023 No Yes of skin I87.2 Venous insufficiency (chronic) (peripheral) 10/05/2022 No Yes I50.32 Chronic diastolic (congestive) heart failure 10/05/2022 No Yes E66.09 Other obesity due to excess calories 10/05/2022 No Yes Inactive Problems Resolved Problems Electronic Signature(s) Signed: 03/09/2023 12:45:10 PM By: Duanne Guess MD FACS Entered By: Duanne Guess on 03/09/2023 12:45:10 -------------------------------------------------------------------------------- Progress Note Details Patient Name: Date of Service: [name], Jonni H. 03/09/2023 12:30 PM Medical Record Number: [id number] Patient Account Number: [id number] Date of Birth/Sex: Treating RN: [birth year] (77 y.o. F) Primary Care Provider: Jerel Shepherd, [place] [facility] N Other Clinician: Referring Provider: Treating Provider/Extender: Jacklynn Ganong, [place] RGA N Weeks in Treatment: 22 Subjective Chief Complaint Information obtained from Patient Bilateral LE Ulcers 10/05/2022: RLE ulcer History of Present Illness (HPI) 07/09/18 on evaluation today patient presents for bilateral lower extremity ulcers. She has a past medical history significant for chronic venous stasis, hypertension, and lower extremity edema with associated skin changes. Unfortunately she tells me that roughly 3 weeks ago she noted ulcers on the left lower extremity which started given her trouble. Subsequently she ended up  proceeding with applying mupirocin ointment and was keeping this open air for the most part. She states that it would scab over somewhat and then reopened. The wounds have been training quite significantly. Fortunately there does not appear to be any evidence of overt infection there is some erythematous type skin changes but I believe this is likely stasis dermatitis nothing more. Fortunately she has no evidence of any systemic infection. No fevers, chills, nausea, or vomiting noted at this time. She has been recommended before to wear compression stockings but she does not wear them on a regular basis. She has never been suggested to get Juxta-Lite compression wraps she was not aware of what these were. Patient had venous studies performed on 11/09/17 which showed no lower surety DVT or deep vein insufficiency noted. She did not have evaluation however of the superficial veins. There was full compressibility noted throughout. Her arterial study which was performed on 11/18/17 actually showed that she has an ABI on the right of 1.17 with a TBI of 1.59 along with an ABI of 1.12 on the left and a TBI of 0.99. 07/17/18 on evaluation today patient appears to be doing much better in regard to her left lower Trinity ulcers. She's been tolerating the dressing changes without complication. There does not appear to be any evidence of infection which is good news. Overall I'm very pleased with the progress at this time. 07/24/18 on evaluation today patient's wound actually appears to be completely healed at this time. Fortunately there does not appear to be any evidence of infection which is good news. Overall I'm very happy with the progress that has been made. She did get her Velcro compression wraps. READMISSION 12/31/2020 This is a patient we had in this clinic in 2019 felt to have chronic venous insufficiency wounds on her lower extremities. She was discharged with bilateral compression stockings. She tells  [place] that she never really wore them or at best wore them sparingly. She is back in clinic today having a ulcer on her left lateral lower extremity since October and one on the right anterior for the last 2 weeks. I do not know that  she is doing any primary dressing on these certainly not wearing any form of compression. She is already been evaluated by vein and vascular on 11/15/2020. They noted that she did have ulcers on the left leg in October that healed with a need Unna boot. She had a reflux study on 12/20 that did not show evidence of a DVT or venous reflux bilaterally. They was not felt that she had arterial insufficiency based on the fact that she had brisk biphasic Doppler signals in the bilateral DP PT. The patient comes in today with superficial wounds on the right anterior mid tibia and the left lateral calf. These are small appear to have healthy surfaces. Past medical history includes cervical and upper thoracic spine surgery on 7/21, hernia repair early in January, hypertension, hyperlipidemia, hypothyroidism, gastric bypass surgery, DVT in the right peroneal artery and left popliteal and peroneal veins. Next problem CHF. She does not have diabetes ABIs in our clinic were 1.1 on the right and 1.2 on the lower [facility], Dakiyah H ([id number]) [phone].pdf Page 6 of 11 2/11; the patient's wound on the left lateral and right anterior lower leg are closed for edema control is good. She has an assortment of old juxta lite stockings with old liners. She does not like to wear anything in particular under the compression part of the juxta lite stocking. I think she is going to do exactly what she wants. READMISSION 10/05/2022 This is a 77 year old woman last seen in our clinic about 18 months ago. She has a history of venous stasis and stasis dermatitis. A reflux study done in 2021 did not show any evidence of venous reflux. She says about 3 weeks ago, she noticed an  ulcer on her right lateral malleolus. She was applying triple antibiotic ointment to it for a while and then was soaking it in Epsom salts. She reports that she usually wears compression stockings, but that they were rubbing so she did not wear 1 on her right leg since the wound appeared, she has a stocking with the toe and heel cut out applied to her left leg. On examination, she has changes of chronic stasis dermatitis on the right. There is a shallow ulcer overlying the lateral malleolus. The fat layer is exposed. It is a little bit desiccated but fairly clean with just some light eschar and slough present. No malodor or purulent drainage. 10/13/2022: The wound is a little smaller today. Although the moisture balance is better than last week, it is still a bit dry. There is still some slough accumulation on the surface. 10/25/2022: The wound continues to contract. There is still thick layer of slough on the surface. The moisture balance of the wound is better, but the periwound skin is slightly macerated. 11/02/2022: The wound is smaller and shallower today. There is still slough on the surface, but the periwound skin is intact. The wound surface is still drier than ideal. 12/15; wound is about the same size minimal depth. Under illumination some debris on the surface she has been using Prisma 11/16/2022: The leg wrap became saturated when her makeshift cast protector leaked. The wound is a little bit larger today, as result of moisture-related tissue breakdown. The surface remains fairly fibrotic. 11/30/2022: The wound measured a little bit larger today secondary to additional moisture related periwound breakdown. There is slough on the wound surface. Still fairly fibrotic underneath. 12/05/2022: The ankle wound is a little smaller but still has slough accumulation. She has been approved for Apligraf  and we are going to apply that today. 12/15/2022: The wound is smaller again today. Minimal slough.  Edema control is good. 12/29/2022: The wound continues to contract. The surface is a little bit less fibrotic today. Light slough accumulation. 01/12/2023: The wound is smaller and has a better fill of granulation tissue. There is just a little slough on the surface and a small remaining area in the center that is still fibrotic. 01/26/2023: The wound measured larger for some reason today, but on visual inspection it appears about the same size. There is more granulation tissue and just a very small area in the center that remains fibrotic. Minimal slough. 02/15/2023: The wound on her right lateral ankle is a little bit smaller and shallower today. The surface remains tough but less fibrotic than on initial intake. She has a wound on her left anterior tibia that she says has been present for about a month. She has been trying to manage it on her own at home but as it has not improved, she wanted [place] to take over management of this site. It is fairly superficial, essentially a deep scrape. There is some slough on the surface. No concern for infection. 02/23/2023: The wound on her left anterior tibial surface is nearly closed underneath the layer of eschar. The right lateral ankle wound is about the same size but a little bit shallower today. She says it has been more tender over the week and indeed, there is some erythema and swelling present. 03/02/2023: The wound on her left anterior tibia is closed. The right lateral ankle wound is shallower with a more healthy-looking surface. 03/09/2023: The right lateral ankle wound has contracted and the surface is more viable-looking. There is some slough and eschar buildup. Edema control is excellent. Patient History Information obtained from Patient. Family History Cancer - Mother,Father, Diabetes - Father, Heart Disease - Father, Hypertension - Father, Stroke - Mother, No family history of Hereditary Spherocytosis, Kidney Disease, Lung Disease, Seizures, Thyroid  Problems, Tuberculosis. Social History Never smoker, Marital Status - Married, Alcohol Use - Daily - 1.5 bottles of wine daily, Drug Use - No History, Caffeine Use - Daily - coffee. Medical History Eyes Patient has history of Cataracts - mild Denies history of Glaucoma, Optic Neuritis Ear/Nose/Mouth/Throat Denies history of Chronic sinus problems/congestion, Middle ear problems Hematologic/Lymphatic Patient has history of Anemia - iron deficiency, Lymphedema Denies history of Hemophilia, Human Immunodeficiency Virus, Sickle Cell Disease Respiratory Denies history of Aspiration, Asthma, Chronic Obstructive Pulmonary Disease (COPD), Pneumothorax, Sleep Apnea, Tuberculosis Cardiovascular Patient has history of Congestive Heart Failure, Hypertension, Peripheral Venous Disease Denies history of Angina, Arrhythmia, Coronary Artery Disease, Deep Vein Thrombosis, Hypotension, Myocardial Infarction, Peripheral Arterial Disease, Phlebitis, Vasculitis Gastrointestinal Denies history of Cirrhosis , Colitis, Crohnoos, Hepatitis A, Hepatitis B, Hepatitis C Endocrine Denies history of Type I Diabetes, Type II Diabetes Genitourinary Denies history of End Stage Renal Disease Immunological Denies history of Lupus Erythematosus, Raynaudoos, Scleroderma Integumentary (Skin) Denies history of History of Burn [place], Denissa H ([id number]) [phone].pdf Page 7 of 11 Musculoskeletal Patient has history of Osteoarthritis Denies history of Gout, Rheumatoid Arthritis, Osteomyelitis Neurologic Patient has history of Neuropathy - bila Denies history of Dementia, Quadriplegia, Paraplegia, Seizure Disorder Oncologic Denies history of Received Chemotherapy, Received Radiation Psychiatric Patient has history of Confinement Anxiety Denies history of Anorexia/bulimia Hospitalization/Surgery History - right arm surgery. - gastric bypass. - hysterectomy. - tonsillectomy. - hernia  repair. - cervical myelopathy with spinal cord compression. Medical A Surgical History Notes nd Constitutional  Symptoms (General Health) obesity Ear/Nose/Mouth/Throat seasonal allergies Cardiovascular aortic murmur, hyperlipidemia, hypertriglyceridemia Gastrointestinal UGI bleed, Endocrine hypothyroidism Objective Constitutional Hypertensive, asymptomatic. no acute distress. Vitals Time Taken: 12:30 PM, Height: 65 in, Weight: 229 lbs, BMI: 38.1, Temperature: 97.7 F, Pulse: 68 bpm, Respiratory Rate: 18 breaths/min, Blood Pressure: 178/85 mmHg. Respiratory Normal work of breathing on room air. General Notes: 03/09/2023: The right lateral ankle wound has contracted and the surface is more viable-looking. There is some slough and eschar buildup. Edema control is excellent. Integumentary (Hair, Skin) Wound #6 status is Open. Original cause of wound was Gradually Appeared. The date acquired was: 09/25/2022. The wound has been in treatment 22 weeks. The wound is located on the Right,Lateral Ankle. The wound measures 0.7cm length x 0.7cm width x 0.1cm depth; 0.385cm^2 area and 0.038cm^3 volume. There is Fat Layer (Subcutaneous Tissue) exposed. There is no tunneling or undermining noted. There is a medium amount of serous drainage noted. The wound margin is flat and intact. There is large (67-100%) red, pink granulation within the wound bed. There is a small (1-33%) amount of necrotic tissue within the wound bed including Adherent Slough. The periwound skin appearance had no abnormalities noted for texture. The periwound skin appearance exhibited: Hemosiderin Staining. The periwound skin appearance did not exhibit: Dry/Scaly, Maceration, Erythema. Periwound temperature was noted as No Abnormality. 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 There was a Selective/Open Wound Non-Viable Tissue Debridement with a total area of 0.49 sq cm performed by Duanne Guess, MD. With the following instrument(s): Curette to remove Non-Viable tissue/material. Material removed includes Eschar and Slough and after achieving pain control using Lidocaine 4% Topical Solution. No specimens were taken. A time out was conducted at 12:41, prior to the start of the procedure. A Minimum amount of bleeding was controlled with Pressure. The procedure was tolerated well. Post Debridement Measurements: 0.7cm length x 0.7cm width x 0.1cm depth; 0.038cm^3 volume. OLIVA, MONTECALVO ([id number]) [id number] Page 8 of 11 Character of Wound/Ulcer Post Debridement is improved. Severity of Tissue Post Debridement is: Fat layer exposed. Post procedure Diagnosis Wound #6: Same as Pre-Procedure General Notes: scribed for Dr. Lady Gary by Samuella Bruin, RN. Plan Follow-up Appointments: Return appointment in 3 weeks. - Dr. Lady Gary - room 2 Anesthetic: (In clinic) Topical Lidocaine 4% applied to wound bed Bathing/ Shower/ Hygiene: May shower with protection but do not get wound dressing(s) wet. Protect dressing(s) with water repellant cover (for example, large plastic bag) or a cast cover and may then take shower. - may purchase cast protector at [facility], [facility] or [employer] Edema Control - Lymphedema / SCD / Other: Elevate legs to the level of the heart or above for 30 minutes daily and/or when sitting for 3-4 times a day throughout the day. Avoid standing for long periods of time. Patient to wear own compression stockings every day. Moisturize legs  daily. The following medication(s) was prescribed: lidocaine topical 4 % cream cream topical was prescribed at facility WOUND #6: - Ankle Wound Laterality: Right, Lateral Cleanser: Soap and Water 1 x Per Week/30 Days Discharge Instructions: May shower and wash wound with dial antibacterial soap and  water prior to dressing change. Peri-Wound Care: Sween Lotion (Moisturizing lotion) 1 x Per Week/30 Days Discharge Instructions: Apply moisturizing lotion as directed Topical: Mupirocin Ointment 1 x Per Week/30 Days Discharge Instructions: Apply Mupirocin (Bactroban) as instructed Prim Dressing: Promogran Prisma Matrix, 4.34 (sq in) (silver collagen) 1 x Per Week/30 Days ary Discharge Instructions: Moisten collagen with saline or hydrogel Secondary Dressing: Woven Gauze Sponge, Non-Sterile 4x4 in 1 x Per Week/30 Days Discharge Instructions: Apply over primary dressing as directed. Secured With: Transpore Surgical T ape, 2x10 (in/yd) 1 x Per Week/30 Days Discharge Instructions: Secure dressing with tape as directed. Com pression Wrap: Urgo K2, two layer compression system, regular 1 x Per Week/30 Days Discharge Instructions: Apply Urgo K2 as directed (alternative to 4 layer compression). 03/09/2023: The right lateral ankle wound has contracted and the surface is more viable-looking. There is some slough and eschar buildup. Edema control is excellent. I used a curette to debride slough and eschar from the wound. We will continue topical mupirocin with Prisma silver collagen. Continue Kerlix and Coban compression. She will be out of town for the next 2 weeks, but her husband will be doing her dressing while they travel. I will see her upon her return. Electronic Signature(s) Signed: 03/09/2023 12:47:30 PM By: Duanne Guess MD FACS Entered By: Duanne Guess on 03/09/2023 12:47:30 -------------------------------------------------------------------------------- HxROS Details Patient Name: Date of  Service: [name], Marybeth H. 03/09/2023 12:30 PM Medical Record Number: [id number] Patient Account Number: [id number] Date of Birth/Sex: Treating RN: 1946/08/21 (77 y.o. F) Primary Care Provider: Jerel Shepherd, [place] [facility] Other Clinician: Referring Provider: Treating Provider/Extender: Jacklynn Ganong, [place] [facility] Weeks in Treatment: 22 Information Obtained From Patient Constitutional Symptoms (General Health) Medical History: Past Medical History Notes: obesity Eyes Medical History: Positive for: Cataracts - mild Negative for: Glaucoma; Optic Neuritis PINKEY, MCJUNKIN ([id number]) [phone].pdf Page 9 of 11 Ear/Nose/Mouth/Throat Medical History: Negative for: Chronic sinus problems/congestion; Middle ear problems Past Medical History Notes: seasonal allergies Hematologic/Lymphatic Medical History: Positive for: Anemia - iron deficiency; Lymphedema Negative for: Hemophilia; Human Immunodeficiency Virus; Sickle Cell Disease Respiratory Medical History: Negative for: Aspiration; Asthma; Chronic Obstructive Pulmonary Disease (COPD); Pneumothorax; Sleep Apnea; Tuberculosis Cardiovascular Medical History: Positive for: Congestive Heart Failure; Hypertension; Peripheral Venous Disease Negative for: Angina; Arrhythmia; Coronary Artery Disease; Deep Vein Thrombosis; Hypotension; Myocardial Infarction; Peripheral Arterial Disease; Phlebitis; Vasculitis Past Medical History Notes: aortic murmur, hyperlipidemia, hypertriglyceridemia Gastrointestinal Medical History: Negative for: Cirrhosis ; Colitis; Crohns; Hepatitis A; Hepatitis B; Hepatitis C Past Medical History Notes: UGI bleed, Endocrine Medical History: Negative for: Type I Diabetes; Type II Diabetes Past Medical History Notes: hypothyroidism Genitourinary Medical History: Negative for: End Stage Renal Disease Immunological Medical History: Negative for: Lupus Erythematosus; Raynauds;  Scleroderma Integumentary (Skin) Medical History: Negative for: History of Burn Musculoskeletal Medical History: Positive for: Osteoarthritis Negative for: Gout; Rheumatoid Arthritis; Osteomyelitis Neurologic Medical History: Positive for: Neuropathy - bila Negative for: Dementia; Quadriplegia; Paraplegia; Seizure Disorder Oncologic Medical History: Negative for: Received Chemotherapy; Received Radiation Psychiatric Medical History: Positive for: Confinement Anxiety Negative for: Anorexia/bulimia HBO Extended History Items REALITY, DEJONGE ([id number]) [phone].pdf Page 10 of 11 Eyes: Cataracts Immunizations Pneumococcal Vaccine: Received Pneumococcal Vaccination: No Implantable Devices No devices added Hospitalization / Surgery History Type of Hospitalization/Surgery right arm surgery gastric bypass hysterectomy tonsillectomy hernia repair cervical myelopathy with spinal cord compression Family and Social History Cancer: Yes - Mother,Father; Diabetes: Yes - Father; Heart Disease: Yes - Father; Hereditary Spherocytosis: No; Hypertension: Yes - Father; Kidney Disease:  No; Lung Disease: No; Seizures: No; Stroke: Yes - Mother; Thyroid Problems: No; Tuberculosis: No; Never smoker; Marital Status - Married; Alcohol Use: Daily - 1.5 bottles of wine daily; Drug Use: No History; Caffeine Use: Daily - coffee; Financial Concerns: No; Food, Clothing or Shelter Needs: No; Support System Lacking: No; Transportation Concerns: No Electronic Signature(s) Signed: 03/09/2023 2:25:12 PM By: Duanne Guess MD FACS Entered By: Duanne Guess on 03/09/2023 12:46:07 -------------------------------------------------------------------------------- SuperBill Details Patient Name: Date of Service: [name], Naylani H. 03/09/2023 Medical Record Number: [id number] Patient Account Number: [id number] Date of Birth/Sex: Treating RN: 06/18/1946 (77 y.o. F) Primary Care  Provider: Jerel Shepherd, [place] [facility] Other Clinician: Referring Provider: Treating Provider/Extender: Jacklynn [place], [place] [facility] Weeks in Treatment: 22 Diagnosis Coding ICD-10 Codes Code Description L97.312 Non-pressure chronic ulcer of right ankle with fat layer exposed L97.821 Non-pressure chronic ulcer of other part of left lower leg limited to breakdown of skin I87.2 Venous insufficiency (chronic) (peripheral) I50.32 Chronic diastolic (congestive) heart failure E66.09 Other obesity due to excess calories Facility Procedures : CPT4 Code: [id number] Description: 97597 - DEBRIDE WOUND 1ST 20 SQ CM OR < ICD-10 Diagnosis Description L97.312 Non-pressure chronic ulcer of right ankle with fat layer exposed Modifier: Quantity: 1 Physician Procedures : CPT4 Code Description Modifier [id number] - WC PHYS LEVEL 4 - EST PT 25 ICD-10 Diagnosis Description L97.821 Non-pressure chronic ulcer of other part of left lower leg limited to breakdown of skin I87.2 Venous insufficiency (chronic) (peripheral)  I50.32 Chronic diastolic (congestive) heart failure E66.09 Other obesity due to excess calories Quantity: 1 : [id number] 97597 - WC PHYS DEBR WO ANESTH 20 SQ CM ICD-10 Diagnosis Description L97.312 Non-pressure chronic ulcer of right ankle with fat layer exposed [place], Tobi H ([id number]) [phone].pdf P Quantity: 1 age 4 of 35 Electronic Signature(s) Signed: 03/09/2023 12:48:57 PM By: Duanne Guess MD FACS Entered By: Duanne Guess on 03/09/2023 12:48:57

## 2023-03-09 NOTE — Progress Notes (Signed)
[name] ([id number]) [id number] Page 1 of 7 Visit Report for 03/09/2023 Arrival Information Details Patient Name: Date of Service: [name], [name] 03/09/2023 12:30 PM Medical Record Number: [id number] Patient Account Number: [id number] Date of Birth/Sex: Treating RN: 05-25-1946 (77 y.o. Caro [name], Ladona Ridgel Primary Care Alesia Oshields: Jerel [place], [place] [facility] N Other Clinician: Referring Lochlyn Zullo: Treating Darrelle Wiberg/Extender: Jacklynn Ganong, [place] [facility] N Weeks in Treatment: 22 Visit Information History Since Last Visit Added or deleted any medications: No Patient Arrived: Cane Any new allergies or adverse reactions: No Arrival Time: 12:29 Had a fall or experienced change in No Accompanied By: husband activities of daily living that may affect Transfer Assistance: None risk of falls: Patient Identification Verified: Yes Signs or symptoms of abuse/neglect since last visito No Secondary Verification Process Completed: Yes Hospitalized since last visit: No Patient Requires Transmission-Based Precautions: No Implantable device outside of the clinic excluding No Patient Has Alerts: No cellular tissue based products placed in the center since last visit: Has Dressing in Place as Prescribed: Yes Has Compression in Place as Prescribed: Yes Pain Present Now: No Electronic Signature(s) Signed: 03/09/2023 3:45:26 PM By: Samuella Bruin Entered By: Samuella Bruin on 03/09/2023 12:30:18 -------------------------------------------------------------------------------- Encounter Discharge Information Details Patient Name: Date of Service: [name], [name]. 03/09/2023 12:30 PM Medical Record Number: [id number] Patient Account Number: [id number] Date of Birth/Sex: Treating RN: [birth year] (77 y.o. [name] Primary Care Arel Tippen: Jerel Shepherd, [place] [facility] N Other Clinician: Referring Geovanie Winnett: Treating Aniken Monestime/Extender: Jacklynn [place]ong,  [place] [facility] Weeks in Treatment: 22 Encounter Discharge Information Items Post Procedure Vitals Discharge Condition: Stable Temperature (F): 97.7 Ambulatory Status: Cane Pulse (bpm): 68 Discharge Destination: Home Respiratory Rate (breaths/min): 18 Transportation: Private Auto Blood Pressure (mmHg): 178/85 Accompanied By: self Schedule Follow-up Appointment: Yes Clinical Summary of Care: Patient Declined Electronic Signature(s) Signed: 03/09/2023 3:45:26 PM By: Samuella Bruin Entered By: Samuella Bruin on 03/09/2023 12:55:54 -------------------------------------------------------------------------------- Lower Extremity Assessment Details Patient Name: Date of Service: [name], [name] 03/09/2023 12:30 PM Medical Record Number: [id number] Patient Account Number: [id number] Date of Birth/Sex: Treating RN: 07/16/1946 (77 y.o. [name] Primary Care Akyra Bouchie: Jerel [place], [place] [facility] N Other Clinician: Referring Treyvonne Tata: Treating Praise Stennett/Extender: Jacklynn [place]ong, [place] [facility] N Weeks in Treatment: 22 Edema Assessment M[LeftDORINNE, [name] ([id number])] Franne Forts: [id number] Page 2 of 7] Assessed: [Left: No] [Right: No] Edema: [Left: Yes] [Right: Yes] Calf Left: Right: Point of Measurement: From Medial Instep 34 cm 32 cm Ankle Left: Right: Point of Measurement: From Medial Instep 21 cm 21.8 cm Vascular Assessment Pulses: Dorsalis Pedis Palpable: [Right:Yes] Electronic Signature(s) Signed: 03/09/2023 3:45:26 PM By: Samuella Bruin Entered By: Samuella Bruin on 03/09/2023 12:35:04 -------------------------------------------------------------------------------- Multi Wound Chart Details Patient Name: Date of Service: [name], [name]. 03/09/2023 12:30 PM Medical Record Number: [id number] Patient Account Number: [id number] Date of Birth/Sex: Treating RN: [birth year] (77 y.o. F) Primary Care Jamerion Cabello: [place] [facility] N  Other Clinician: Referring Somaly Marteney: Treating Yoona Ishii/Extender: Jacklynn [place]ong, [place] [facility] N Weeks in Treatment: 22 Vital Signs Height(in): 65 Pulse(bpm): 68 Weight(lbs): 229 Blood Pressure(mmHg): 178/85 Body Mass Index(BMI): 38.1 Temperature(F): 97.7 Respiratory Rate(breaths/min): 18 [6:Photos:] [N/A:N/A] Right, Lateral Ankle N/A N/A Wound Location: Gradually Appeared N/A N/A Wounding Event: Venous Leg Ulcer N/A N/A Primary Etiology: Cataracts, Anemia, Lymphedema, N/A N/A Comorbid History: Congestive Heart Failure, Hypertension, Peripheral Venous Disease, Osteoarthritis, Neuropathy, Confinement Anxiety 09/25/2022 N/A N/A Date Acquired: 22 N/A N/A Weeks of Treatment: Open N/A N/A Wound Status: No  N/A N/A Wound Recurrence: 0.7x0.7x0.1 N/A N/A Measurements L x W x D (cm) 0.385 N/A N/A A (cm) : rea 0.038 N/A N/A Volume (cm) : 62.90% N/A N/A % Reduction in Area: 63.50% N/A N/A % Reduction in Volume: Full Thickness Without Exposed N/A N/A Classification: Support Structures Medium N/A N/A Exudate Amount: Serous N/A N/A Exudate Type: amber N/A N/A Exudate Color: Flat and Intact N/A N/A Wound Margin: Large (67-100%) N/A N/A Granulation AmountERISA, [name] ([id number]) [id number] Page 3 of 7 Red, Pink N/A N/A Granulation Quality: Small (1-33%) N/A N/A Necrotic Amount: Fat Layer (Subcutaneous Tissue): Yes N/A N/A Exposed Structures: Fascia: No Tendon: No Muscle: No Joint: No Bone: No Medium (34-66%) N/A N/A Epithelialization: Debridement - Selective/Open Wound N/A N/A Debridement: Pre-procedure Verification/Time Out 12:41 N/A N/A Taken: Lidocaine 4% Topical Solution N/A N/A Pain Control: Necrotic/Eschar, Slough N/A N/A Tissue Debrided: Non-Viable Tissue N/A N/A Level: 0.49 N/A N/A Debridement A (sq cm): rea Curette N/A N/A Instrument: Minimum N/A N/A Bleeding: Pressure N/A N/A Hemostasis A  chieved: Procedure was tolerated well N/A N/A Debridement Treatment Response: 0.7x0.7x0.1 N/A N/A Post Debridement Measurements L x W x D (cm) 0.038 N/A N/A Post Debridement Volume: (cm) No Abnormalities Noted N/A N/A Periwound Skin Texture: Maceration: No N/A N/A Periwound Skin Moisture: Dry/Scaly: No Hemosiderin Staining: Yes N/A N/A Periwound Skin Color: Erythema: No No Abnormality N/A N/A Temperature: Debridement N/A N/A Procedures Performed: Treatment Notes Electronic Signature(s) Signed: 03/09/2023 12:45:15 PM By: Duanne Guess MD FACS Entered By: Duanne Guess on 03/09/2023 12:45:15 -------------------------------------------------------------------------------- Multi-Disciplinary Care Plan Details Patient Name: Date of Service: [name], Yarelly [name]. 03/09/2023 12:30 PM Medical Record Number: [id number] Patient Account Number: [id number] Date of Birth/Sex: Treating RN: 08-24-1946 (77 y.o. [name] Primary Care Brenisha Tsui: Jerel [place], [place] [facility] N Other Clinician: Referring Eola Waldrep: Treating Latrise Bowland/Extender: Jacklynn Ganong, [place] [facility] N Weeks in Treatment: 22 Multidisciplinary Care Plan reviewed with physician Active Inactive Nutrition Nursing Diagnoses: Potential for alteratiion in Nutrition/Potential for imbalanced nutrition Goals: Patient/caregiver agrees to and verbalizes understanding of need to use nutritional supplements and/or vitamins as prescribed Date Initiated: 10/13/2022 Target Resolution Date: 03/30/2023 Goal Status: Active Interventions: Provide education on nutrition Treatment Activities: Dietary management education, guidance and counseling : 10/13/2022 Education provided on Nutrition : 10/25/2022 Notes: Venous Leg Ulcer Nursing Diagnoses: Knowledge deficit related to disease process and management Potential for venous Insuffiency (use before diagnosis confirmed) [name], [name] ([id number])  [id number] Page 4 of 7 Goals: Patient will maintain optimal edema control Date Initiated: 03/02/2023 Target Resolution Date: 03/30/2023 Goal Status: Active Interventions: Assess peripheral edema status every visit. Compression as ordered Provide education on venous insufficiency Treatment Activities: Therapeutic compression applied : 03/02/2023 Notes: Electronic Signature(s) Signed: 03/09/2023 3:45:26 PM By: Samuella Bruin Entered By: Samuella Bruin on 03/09/2023 12:37:51 -------------------------------------------------------------------------------- Pain Assessment Details Patient Name: Date of Service: [name], [name]. 03/09/2023 12:30 PM Medical Record Number: [id number] Patient Account Number: [id number] Date of Birth/Sex: Treating RN: August 01, 1946 (77 y.o. [name] Primary Care Josedejesus Marcum: Jerel Shepherd, [place] [facility] N Other Clinician: Referring Lakeem Rozo: Treating Pate Aylward/Extender: Jacklynn [place]ong, [place] [facility] N Weeks in Treatment: 22 Active Problems Location of Pain Severity and Description of Pain Patient Has Paino No Site Locations Rate the pain. Current Pain Level: 0 Pain Management and Medication Current Pain Management: Electronic Signature(s) Signed: 03/09/2023 3:45:26 PM By: Samuella Bruin Entered By: Samuella Bruin on 03/09/2023 12:31:08 -------------------------------------------------------------------------------- Patient/Caregiver Education Details Patient Name: Date of Service: [name], [name]. 4/12/2024andnbsp12:30  PM Medical Record Number: [id number] Patient Account Number: [id number] Date of Birth/Gender: Treating RN: Oct 18, 1946 (77 y.o. [name] Primary Care Physician: Jerel Shepherd, [place] [facility] N Other Clinician: Referring Physician: Treating Physician/Extender: Jacklynn Ganong, [place] [facility] N Weeks in Treatment: [address], Elizabella [name] ([id number]) [id number] Page 5  of 7 Education Assessment Education Provided To: Patient Education Topics Provided Safety: Methods: Explain/Verbal Responses: Reinforcements needed, State content correctly Electronic Signature(s) Signed: 03/09/2023 3:45:26 PM By: Samuella Bruin Entered By: Samuella Bruin on 03/09/2023 12:38:03 -------------------------------------------------------------------------------- Wound Assessment Details Patient Name: Date of Service: [name], [name] Bray [name]. 03/09/2023 12:30 PM Medical Record Number: [id number] Patient Account Number: [id number] Date of Birth/Sex: Treating RN: Dec 10, 1945 (77 y.o. [name] Primary Care Demarri Elie: Jerel Shepherd, [place] [facility] N Other Clinician: Referring Hazaiah Edgecombe: Treating Bandy Honaker/Extender: Jacklynn Ganong, [place] [facility] N Weeks in Treatment: 22 Wound Status Wound Number: 6 Primary Venous Leg Ulcer Etiology: Wound Location: Right, Lateral Ankle Wound Open Wounding Event: Gradually Appeared Status: Date Acquired: 09/25/2022 Comorbid Cataracts, Anemia, Lymphedema, Congestive Heart Failure, Weeks Of Treatment: 22 History: Hypertension, Peripheral Venous Disease, Osteoarthritis, Clustered Wound: No Neuropathy, Confinement Anxiety Photos Wound Measurements Length: (cm) Width: (cm) Depth: (cm) Area: (cm) Volume: (cm) 0.7 % Reduction in Area: 62.9% 0.7 % Reduction in Volume: 63.5% 0.1 Epithelialization: Medium (34-66%) 0.385 Tunneling: No 0.038 Undermining: No Wound Description Classification: Full Thickness Without Exposed Suppor Wound Margin: Flat and Intact Exudate Amount: Medium Exudate Type: Serous Exudate Color: amber t Structures Foul Odor After Cleansing: No Slough/Fibrino Yes Wound Bed Granulation Amount: Large (67-100%) Exposed Structure Granulation Quality: Red, Pink Fascia Exposed: No Necrotic Amount: Small (1-33%) Fat Layer (Subcutaneous Tissue) Exposed: Yes Necrotic Quality: Adherent Slough Tendon Exposed:  No Muscle Exposed: No Joint Exposed: No [name], [name] ([id number]) [id number] Page 6 of 7 Bone Exposed: No Periwound Skin Texture Texture Color No Abnormalities Noted: Yes No Abnormalities Noted: No Erythema: No Moisture Hemosiderin Staining: Yes No Abnormalities Noted: No Dry / Scaly: No Temperature / Pain Maceration: No Temperature: No Abnormality Treatment Notes Wound #6 (Ankle) Wound Laterality: Right, Lateral Cleanser Soap and Water Discharge Instruction: May shower and wash wound with dial antibacterial soap and water prior to dressing change. Peri-Wound Care Sween Lotion (Moisturizing lotion) Discharge Instruction: Apply moisturizing lotion as directed Topical Mupirocin Ointment Discharge Instruction: Apply Mupirocin (Bactroban) as instructed Primary Dressing Promogran Prisma Matrix, 4.34 (sq in) (silver collagen) Discharge Instruction: Moisten collagen with saline or hydrogel Secondary Dressing Woven Gauze Sponge, Non-Sterile 4x4 in Discharge Instruction: Apply over primary dressing as directed. Secured With Transpore Surgical Tape, 2x10 (in/yd) Discharge Instruction: Secure dressing with tape as directed. Compression Wrap Urgo K2, two layer compression system, regular Discharge Instruction: Apply Urgo K2 as directed (alternative to 4 layer compression). Compression Stockings Add-Ons Electronic Signature(s) Signed: 03/09/2023 3:45:26 PM By: Samuella Bruin Entered By: Samuella Bruin on 03/09/2023 12:36:45 -------------------------------------------------------------------------------- Vitals Details Patient Name: Date of Service: [name], [name]. 03/09/2023 12:30 PM Medical Record Number: [id number] Patient Account Number: [id number] Date of Birth/Sex: Treating RN: Sep 23, 1946 (77 y.o. [name] Primary Care Emillio Ngo: Jerel Shepherd, [place] [facility] N Other Clinician: Referring Joelly Bolanos: Treating Samul Mcinroy/Extender:  Jacklynn Ganong, [place] [facility] N Weeks in Treatment: 22 Vital Signs Time Taken: 12:30 Temperature (F): 97.7 Height (in): 65 Pulse (bpm): 68 Weight (lbs): 229 Respiratory Rate (breaths/min): 18 Body Mass Index (BMI): 38.1 Blood Pressure (mmHg): 178/85 Reference Range: 80 - 120 mg / dl Electronic Signature(s) Signed: 03/09/2023 3:45:26 PM By: Madelaine Etienne, Isebella  [name] ([id number]) [id number] Page 7 of 7 Entered By: Samuella Bruin on 03/09/2023 12:31:02

## 2023-03-15 NOTE — Progress Notes (Signed)
NIHARIKA, [name] ([id number]) [id number] Page 1 of 12 Visit Report for 02/15/2023 Chief Complaint Document Details Patient Name: Date of Service: [name], [name] 02/15/2023 1:15 PM Medical Record Number: [id number] Patient Account Number: [id number] Date of Birth/Sex: Treating RN: [birth year] (77 y.o. F) Primary Care Provider: Jerel Shepherd, Tonie Griffith [facility] [name] Other Clinician: Referring Provider: Treating Provider/Extender: [name], [name] Weeks in Treatment: 19 Information Obtained from: Patient Chief Complaint Bilateral LE Ulcers 10/05/2022: RLE ulcer Electronic Signature(s) Signed: 02/15/2023 2:12:20 PM By: Duanne Guess MD FACS Entered By: Duanne Guess on 02/15/2023 14:12:20 -------------------------------------------------------------------------------- Debridement Details Patient Name: Date of Service: [name], [name]. 02/15/2023 1:15 PM Medical Record Number: [id number] Patient Account Number: [id number] Date of Birth/Sex: Treating RN: [name], [name] (78 y.o. [name] Primary Care Provider: Jerel Shepherd, [name] Other Clinician: Referring Provider: Treating Provider/Extender: [name], [name] Weeks in Treatment: 19 Debridement Performed for Assessment: Wound #8 Left,Anterior Lower Leg Performed By: Physician Duanne Guess, MD Debridement Type: Debridement Severity of Tissue Pre Debridement: Fat layer exposed Level of Consciousness (Pre-procedure): Awake and Alert Pre-procedure Verification/Time Out Yes - 13:50 Taken: Start Time: 13:51 Pain Control: Lidocaine 4% T opical Solution T Area Debrided (L x W): otal 3 (cm) x 1.9 (cm) = 5.7 (cm) Tissue and other material debrided: Non-Viable, Slough, Slough Level: Non-Viable Tissue Debridement Description: Selective/Open Wound Instrument: Curette Bleeding: Minimum Hemostasis Achieved: Pressure Response to Treatment: Procedure was tolerated well Level  of Consciousness (Post- Awake and Alert procedure): Post Debridement Measurements of Total Wound Length: (cm) 3 Width: (cm) 1.9 Depth: (cm) 0.1 Volume: (cm) 0.448 Character of Wound/Ulcer Post Debridement: Stable Severity of Tissue Post Debridement: Fat layer exposed Post Procedure Diagnosis Same as Pre-procedure Notes Scribed for Dr. Lady Gary by Brenton Grills RN Electronic Signature(s) Signed: 02/15/2023 4:25:44 PM By: Duanne Guess MD FACS Signed: 03/15/2023 1:55:51 PM By: Lucrezia Starch, [name] ([id number]) By: Brenton Grills [phone].pdf Page 2 of 12 Signed: 03/15/2023 1:55:51 PM Entered By: Brenton Grills on 02/15/2023 13:53:37 -------------------------------------------------------------------------------- Debridement Details Patient Name: Date of Service: [name], [name] 02/15/2023 1:15 PM Medical Record Number: [id number] Patient Account Number: [id number] Date of Birth/Sex: Treating RN: [birth year], [name] (77 y.o. [name] Primary Care Provider: Jerel Shepherd, [name] [facility] [name] Other Clinician: Referring Provider: Treating Provider/Extender: [name], [name] Weeks in Treatment: 19 Debridement Performed for Assessment: Wound #6 Right,Lateral Ankle Performed By: Physician Duanne Guess, MD Debridement Type: Debridement Severity of Tissue Pre Debridement: Fat layer exposed Level of Consciousness (Pre-procedure): Awake and Alert Pre-procedure Verification/Time Out Yes - 13:50 Taken: Start Time: 13:51 Pain Control: Lidocaine 4% T opical Solution T Area Debrided (L x W): otal 0.7 (cm) x 0.7 (cm) = 0.49 (cm) Tissue and other material debrided: Non-Viable, Slough, Slough Level: Non-Viable Tissue Debridement Description: Selective/Open Wound Instrument: Curette Bleeding: Minimum Hemostasis Achieved: Pressure Response to Treatment: Procedure was tolerated well Level of Consciousness (Post- Awake and  Alert procedure): Post Debridement Measurements of Total Wound Length: (cm) 0.7 Width: (cm) 0.7 Depth: (cm) 0.1 Volume: (cm) 0.038 Character of Wound/Ulcer Post Debridement: Stable Severity of Tissue Post Debridement: Fat layer exposed Post Procedure Diagnosis Same as Pre-procedure Notes Scribed on behalf of Dr. Lady Gary by Brenton Grills RN. Electronic Signature(s) Signed: 02/15/2023 4:25:44 PM By: Duanne Guess MD FACS Signed: 03/15/2023 1:55:51 PM By: Brenton Grills Entered By: Brenton Grills on 02/15/2023 13:54:54 -------------------------------------------------------------------------------- HPI Details Patient Name: Date of Service: [name], [name]. 02/15/2023 1:15  PM Medical Record Number: [id number] Patient Account Number: [id number] Date of Birth/Sex: Treating RN: [birth year] (77 y.o. F) Primary Care Provider: Jerel Shepherd, Tonie [facility] [name] Other Clinician: Referring Provider: Treating Provider/Extender: [name], [name] Weeks in Treatment: 19 History of Present Illness HPI Description: 07/09/18 on evaluation today patient presents for bilateral lower extremity ulcers. She has a past medical history significant for chronic venous stasis, hypertension, and lower extremity edema with associated skin changes. Unfortunately she tells me that roughly 3 weeks ago she noted ulcers on the left lower extremity which started given her trouble. Subsequently she ended up proceeding with applying mupirocin ointment and was keeping this open air for the most part. She states that it would scab over somewhat and then reopened. The wounds have been training quite significantly. Fortunately there does not appear to be any evidence of overt infection there is some erythematous type skin changes but I believe this is likely stasis dermatitis nothing more. Fortunately she has no evidence of any systemic infection. No fevers, chills, nausea, or vomiting noted at this time. She has been  recommended before to wear compression stockings but she does not wear them on a regular basis. She has never been suggested to get Juxta-Lite compression wraps she was not aware of what these were. [name], [name] ([id number]) [id number] Page 3 of 12 Patient had venous studies performed on 11/09/17 which showed no lower surety DVT or deep vein insufficiency noted. She did not have evaluation however of the superficial veins. There was full compressibility noted throughout. Her arterial study which was performed on 11/18/17 actually showed that she has an ABI on the right of 1.17 with a TBI of 1.59 along with an ABI of 1.12 on the left and a TBI of 0.99. 07/17/18 on evaluation today patient appears to be doing much better in regard to her left lower Trinity ulcers. She's been tolerating the dressing changes without complication. There does not appear to be any evidence of infection which is good news. Overall I'm very pleased with the progress at this time. 07/24/18 on evaluation today patient's wound actually appears to be completely healed at this time. Fortunately there does not appear to be any evidence of infection which is good news. Overall I'm very happy with the progress that has been made. She did get her Velcro compression wraps. READMISSION 12/31/2020 This is a patient we had in this clinic in 2019 felt to have chronic venous insufficiency wounds on her lower extremities. She was discharged with bilateral compression stockings. She tells [place] that she never really wore them or at best wore them sparingly. She is back in clinic today having a ulcer on her left lateral lower extremity since October and one on the right anterior for the last 2 weeks. I do not know that she is doing any primary dressing on these certainly not wearing any form of compression. She is already been evaluated by vein and vascular on 11/15/2020. They noted that she did have ulcers on the  left leg in October that healed with a need Unna boot. She had a reflux study on 12/20 that did not show evidence of a DVT or venous reflux bilaterally. They was not felt that she had arterial insufficiency based on the fact that she had brisk biphasic Doppler signals in the bilateral DP PT. The patient comes in today with superficial wounds on the right anterior mid tibia and the left lateral calf. These are small appear to  have healthy surfaces. Past medical history includes cervical and upper thoracic spine surgery on 7/21, hernia repair early in January, hypertension, hyperlipidemia, hypothyroidism, gastric bypass surgery, DVT in the right peroneal artery and left popliteal and peroneal veins. Next problem CHF. She does not have diabetes ABIs in our clinic were 1.1 on the right and 1.2 on the lower 2/11; the patient's wound on the left lateral and right anterior lower leg are closed for edema control is good. She has an assortment of old juxta lite stockings with old liners. She does not like to wear anything in particular under the compression part of the juxta lite stocking. I think she is going to do exactly what she wants. READMISSION 10/05/2022 This is a 77 year old woman last seen in our clinic about 18 months ago. She has a history of venous stasis and stasis dermatitis. A reflux study done in 2021 did not show any evidence of venous reflux. She says about 3 weeks ago, she noticed an ulcer on her right lateral malleolus. She was applying triple antibiotic ointment to it for a while and then was soaking it in Epsom salts. She reports that she usually wears compression stockings, but that they were rubbing so she did not wear 1 on her right leg since the wound appeared, she has a stocking with the toe and heel cut out applied to her left leg. On examination, she has changes of chronic stasis dermatitis on the right. There is a shallow ulcer overlying the lateral malleolus. The fat layer is  exposed. It is a little bit desiccated but fairly clean with just some light eschar and slough present. No malodor or purulent drainage. 10/13/2022: The wound is a little smaller today. Although the moisture balance is better than last week, it is still a bit dry. There is still some slough accumulation on the surface. 10/25/2022: The wound continues to contract. There is still thick layer of slough on the surface. The moisture balance of the wound is better, but the periwound skin is slightly macerated. 11/02/2022: The wound is smaller and shallower today. There is still slough on the surface, but the periwound skin is intact. The wound surface is still drier than ideal. 12/15; wound is about the same size minimal depth. Under illumination some debris on the surface she has been using Prisma 11/16/2022: The leg wrap became saturated when her makeshift cast protector leaked. The wound is a little bit larger today, as result of moisture-related tissue breakdown. The surface remains fairly fibrotic. 11/30/2022: The wound measured a little bit larger today secondary to additional moisture related periwound breakdown. There is slough on the wound surface. Still fairly fibrotic underneath. 12/05/2022: The ankle wound is a little smaller but still has slough accumulation. She has been approved for Apligraf and we are going to apply that today. 12/15/2022: The wound is smaller again today. Minimal slough. Edema control is good. 12/29/2022: The wound continues to contract. The surface is a little bit less fibrotic today. Light slough accumulation. 01/12/2023: The wound is smaller and has a better fill of granulation tissue. There is just a little slough on the surface and a small remaining area in the center that is still fibrotic. 01/26/2023: The wound measured larger for some reason today, but on visual inspection it appears about the same size. There is more granulation tissue and just a very small area in the  center that remains fibrotic. Minimal slough. 02/15/2023: The wound on her right lateral ankle is a little  bit smaller and shallower today. The surface remains tough but less fibrotic than on initial intake. She has a wound on her left anterior tibia that she says has been present for about a month. She has been trying to manage it on her own at home but as it has not improved, she wanted [place] to take over management of this site. It is fairly superficial, essentially a deep scrape. There is some slough on the surface. No concern for infection. Electronic Signature(s) Signed: 02/15/2023 2:13:37 PM By: Duanne Guess MD FACS Entered By: Duanne Guess on 02/15/2023 14:13:36 -------------------------------------------------------------------------------- Physical Exam Details Patient Name: Date of Service: VIVIENE, THURSTON 02/15/2023 1:15 PM Medical Record Number: [id number] Patient Account Number: [id number] Date of Birth/Sex: Treating RN: [name] (77 y.o. Agnes Brightbill, [place] [name] ([id number]) [id number] Page 4 of 12 Primary Care Provider: Jerel Shepherd, [facility] [name] Other Clinician: Referring Provider: Treating Provider/Extender: [name], [name] Weeks in Treatment: 19 Constitutional Hypertensive, asymptomatic. . . . no acute distress. Respiratory Normal work of breathing on room air. Notes 02/15/2023: The wound on her right lateral ankle is a little bit smaller and shallower today. The surface remains tough but less fibrotic than on initial intake. She has a wound on her left anterior tibia that she says has been present for about a month. She has been trying to manage it on her own at home but as it has not improved, she wanted [place] to take over management of this site. It is fairly superficial, essentially a deep scrape. There is some slough on the surface. No concern for infection. Electronic Signature(s) Signed: 02/15/2023 2:14:13 PM By: Duanne Guess MD FACS Entered By: Duanne Guess on 02/15/2023 14:14:13 -------------------------------------------------------------------------------- Physician Orders Details Patient Name: Date of Service: [name], Stark Bray [name]. 02/15/2023 1:15 PM Medical Record Number: [id number] Patient Account Number: [id number] Date of Birth/Sex: Treating RN: [name] (77 y.o. [name] Primary Care Provider: Jerel Shepherd, [name] [facility] [name] Other Clinician: Referring Provider: Treating Provider/Extender: [name], [name] Weeks in Treatment: 40 Verbal / Phone Orders: No Diagnosis Coding ICD-[name] Coding Code Description L97.312 Non-pressure chronic ulcer of right ankle with fat layer exposed L97.821 Non-pressure chronic ulcer of other part of left lower leg limited to breakdown of skin I87.2 Venous insufficiency (chronic) (peripheral) I50.32 Chronic diastolic (congestive) heart failure E66.09 Other obesity due to excess calories Follow-up Appointments ppointment in 1 week. - Dr. Lady Gary RM 2 Return A Friday 02/23/23 at 12:30pm Anesthetic (In clinic) Topical Lidocaine 4% applied to wound bed Bathing/ Shower/ Hygiene May shower with protection but do not get wound dressing(s) wet. Protect dressing(s) with water repellant cover (for example, large plastic bag) or a cast cover and may then take shower. - may purchase cast protector at [facility], [facility] or [employer] Edema Control - Lymphedema / SCD / Other Bilateral Lower Extremities Elevate legs to the level of the heart or above for 30 minutes daily and/or when sitting for 3-4 times a day throughout the day. Avoid standing for long periods of time. Patient to wear own compression stockings every day. Moisturize legs daily. Wound Treatment Wound #6 - Ankle Wound Laterality: Right, Lateral Cleanser: Soap and Water 1 x Per Week/30 Days Discharge Instructions: May shower and wash wound with dial antibacterial soap and water prior to dressing  change. Peri-Wound Care: Sween Lotion (Moisturizing lotion) 1 x Per Week/30 Days Discharge Instructions: Apply moisturizing lotion as directed Prim Dressing: Promogran Prisma Matrix, 4.34 (sq  in) (silver collagen) 1 x Per Week/30 Days ary Discharge Instructions: Moisten collagen with saline or hydrogel Secondary Dressing: Woven Gauze Sponge, Non-Sterile 4x4 in 1 x Per Week/30 Days Discharge Instructions: Apply over primary dressing as directed. TURNER, KUNZMAN ([id number]) [id number] Page 5 of 12 Secured With: Transpore Surgical Tape, 2x10 (in/yd) 1 x Per Week/30 Days Discharge Instructions: Secure dressing with tape as directed. Compression Wrap: ThreePress (3 layer compression wrap) 1 x Per Week/30 Days Discharge Instructions: Apply three layer compression as directed. Wound #8 - Lower Leg Wound Laterality: Left, Anterior Peri-Wound Care: Sween Lotion (Moisturizing lotion) 1 x Per Week Discharge Instructions: Apply moisturizing lotion as directed Prim Dressing: Maxorb Extra Ag+ Alginate Dressing, 2x2 (in/in) 1 x Per Week ary Discharge Instructions: Apply to wound bed as instructed Secondary Dressing: Woven Gauze Sponge, Non-Sterile 4x4 in 1 x Per Week Discharge Instructions: Apply over primary dressing as directed. Compression Wrap: ThreePress (3 layer compression wrap) 1 x Per Week Discharge Instructions: Apply three layer compression as directed. Electronic Signature(s) Signed: 02/15/2023 4:25:44 PM By: Duanne Guess MD FACS Entered By: Duanne Guess on 02/15/2023 14:14:23 -------------------------------------------------------------------------------- Problem List Details Patient Name: Date of Service: [name], Acelyn [name]. 02/15/2023 1:15 PM Medical Record Number: [id number] Patient Account Number: [id number] Date of Birth/Sex: Treating RN: [name] (77 y.o. [name] Primary Care Provider: Jerel Shepherd, [name] [facility] [name] Other  Clinician: Referring Provider: Treating Provider/Extender: [name], [name] Weeks in Treatment: 19 Active Problems ICD-[name] Encounter Code Description Active Date MDM Diagnosis L97.312 Non-pressure chronic ulcer of right ankle with fat layer exposed 10/05/2022 No Yes L97.821 Non-pressure chronic ulcer of other part of left lower leg limited to breakdown 02/15/2023 No Yes of skin I87.2 Venous insufficiency (chronic) (peripheral) 10/05/2022 No Yes I50.32 Chronic diastolic (congestive) heart failure 10/05/2022 No Yes E66.09 Other obesity due to excess calories 10/05/2022 No Yes Inactive Problems Resolved Problems Electronic Signature(s) Signed: 02/15/2023 2:11:19 PM By: Duanne Guess MD FACS Entered By: Duanne Guess on 02/15/2023 14:11:19 [name] ([id number]) [id number] Page 6 of 12 -------------------------------------------------------------------------------- Progress Note Details Patient Name: Date of Service: [name], [name] 02/15/2023 1:15 PM Medical Record Number: [id number] Patient Account Number: [id number] Date of Birth/Sex: Treating RN: [birth year][name] (77 y.o. F) Primary Care Provider: Jerel Shepherd, [name] [facility] [name] Other Clinician: Referring Provider: Treating Provider/Extender: [name], [name] Weeks in Treatment: 19 Subjective Chief Complaint Information obtained from Patient Bilateral LE Ulcers 10/05/2022: RLE ulcer History of Present Illness (HPI) 07/09/18 on evaluation today patient presents for bilateral lower extremity ulcers. She has a past medical history significant for chronic venous stasis, hypertension, and lower extremity edema with associated skin changes. Unfortunately she tells me that roughly 3 weeks ago she noted ulcers on the left lower extremity which started given her trouble. Subsequently she ended up proceeding with applying mupirocin ointment and was keeping this open air for the  most part. She states that it would scab over somewhat and then reopened. The wounds have been training quite significantly. Fortunately there does not appear to be any evidence of overt infection there is some erythematous type skin changes but I believe this is likely stasis dermatitis nothing more. Fortunately she has no evidence of any systemic infection. No fevers, chills, nausea, or vomiting noted at this time. She has been recommended before to wear compression stockings but she does not wear them on a regular basis. She has never been suggested to get Juxta-Lite compression wraps she was  not aware of what these were. Patient had venous studies performed on 11/09/17 which showed no lower surety DVT or deep vein insufficiency noted. She did not have evaluation however of the superficial veins. There was full compressibility noted throughout. Her arterial study which was performed on 11/18/17 actually showed that she has an ABI on the right of 1.17 with a TBI of 1.59 along with an ABI of 1.12 on the left and a TBI of 0.99. 07/17/18 on evaluation today patient appears to be doing much better in regard to her left lower Trinity ulcers. She's been tolerating the dressing changes without complication. There does not appear to be any evidence of infection which is good news. Overall I'm very pleased with the progress at this time. 07/24/18 on evaluation today patient's wound actually appears to be completely healed at this time. Fortunately there does not appear to be any evidence of infection which is good news. Overall I'm very happy with the progress that has been made. She did get her Velcro compression wraps. READMISSION 12/31/2020 This is a patient we had in this clinic in 2019 felt to have chronic venous insufficiency wounds on her lower extremities. She was discharged with bilateral compression stockings. She tells [place] that she never really wore them or at best wore them sparingly. She is back in  clinic today having a ulcer on her left lateral lower extremity since October and one on the right anterior for the last 2 weeks. I do not know that she is doing any primary dressing on these certainly not wearing any form of compression. She is already been evaluated by vein and vascular on 11/15/2020. They noted that she did have ulcers on the left leg in October that healed with a need Unna boot. She had a reflux study on 12/20 that did not show evidence of a DVT or venous reflux bilaterally. They was not felt that she had arterial insufficiency based on the fact that she had brisk biphasic Doppler signals in the bilateral DP PT. The patient comes in today with superficial wounds on the right anterior mid tibia and the left lateral calf. These are small appear to have healthy surfaces. Past medical history includes cervical and upper thoracic spine surgery on 7/21, hernia repair early in January, hypertension, hyperlipidemia, hypothyroidism, gastric bypass surgery, DVT in the right peroneal artery and left popliteal and peroneal veins. Next problem CHF. She does not have diabetes ABIs in our clinic were 1.1 on the right and 1.2 on the lower 2/11; the patient's wound on the left lateral and right anterior lower leg are closed for edema control is good. She has an assortment of old juxta lite stockings with old liners. She does not like to wear anything in particular under the compression part of the juxta lite stocking. I think she is going to do exactly what she wants. READMISSION 10/05/2022 This is a 77 year old woman last seen in our clinic about 18 months ago. She has a history of venous stasis and stasis dermatitis. A reflux study done in 2021 did not show any evidence of venous reflux. She says about 3 weeks ago, she noticed an ulcer on her right lateral malleolus. She was applying triple antibiotic ointment to it for a while and then was soaking it in Epsom salts. She reports that she usually  wears compression stockings, but that they were rubbing so she did not wear 1 on her right leg since the wound appeared, she has a stocking with  the toe and heel cut out applied to her left leg. On examination, she has changes of chronic stasis dermatitis on the right. There is a shallow ulcer overlying the lateral malleolus. The fat layer is exposed. It is a little bit desiccated but fairly clean with just some light eschar and slough present. No malodor or purulent drainage. 10/13/2022: The wound is a little smaller today. Although the moisture balance is better than last week, it is still a bit dry. There is still some slough accumulation on the surface. 10/25/2022: The wound continues to contract. There is still thick layer of slough on the surface. The moisture balance of the wound is better, but the periwound skin is slightly macerated. 11/02/2022: The wound is smaller and shallower today. There is still slough on the surface, but the periwound skin is intact. The wound surface is still drier than ideal. 12/15; wound is about the same size minimal depth. Under illumination some debris on the surface she has been using Prisma 11/16/2022: The leg wrap became saturated when her makeshift cast protector leaked. The wound is a little bit larger today, as result of moisture-related tissue breakdown. The surface remains fairly fibrotic. 11/30/2022: The wound measured a little bit larger today secondary to additional moisture related periwound breakdown. There is slough on the wound surface. Still fairly fibrotic underneath. 12/05/2022: The ankle wound is a little smaller but still has slough accumulation. She has been approved for Apligraf and we are going to apply that today. [name], [name] ([id number]) [id number] Page 7 of 12 12/15/2022: The wound is smaller again today. Minimal slough. Edema control is good. 12/29/2022: The wound continues to contract. The surface is a  little bit less fibrotic today. Light slough accumulation. 01/12/2023: The wound is smaller and has a better fill of granulation tissue. There is just a little slough on the surface and a small remaining area in the center that is still fibrotic. 01/26/2023: The wound measured larger for some reason today, but on visual inspection it appears about the same size. There is more granulation tissue and just a very small area in the center that remains fibrotic. Minimal slough. 02/15/2023: The wound on her right lateral ankle is a little bit smaller and shallower today. The surface remains tough but less fibrotic than on initial intake. She has a wound on her left anterior tibia that she says has been present for about a month. She has been trying to manage it on her own at home but as it has not improved, she wanted [place] to take over management of this site. It is fairly superficial, essentially a deep scrape. There is some slough on the surface. No concern for infection. Patient History Information obtained from Patient. Family History Cancer - Mother,Father, Diabetes - Father, Heart Disease - Father, Hypertension - Father, Stroke - Mother, No family history of Hereditary Spherocytosis, Kidney Disease, Lung Disease, Seizures, Thyroid Problems, Tuberculosis. Social History Never smoker, Marital Status - Married, Alcohol Use - Daily - 1.5 bottles of wine daily, Drug Use - No History, Caffeine Use - Daily - coffee. Medical History Eyes Patient has history of Cataracts - mild Denies history of Glaucoma, Optic Neuritis Ear/Nose/Mouth/Throat Denies history of Chronic sinus problems/congestion, Middle ear problems Hematologic/Lymphatic Patient has history of Anemia - iron deficiency, Lymphedema Denies history of Hemophilia, Human Immunodeficiency Virus, Sickle Cell Disease Respiratory Denies history of Aspiration, Asthma, Chronic Obstructive Pulmonary Disease (COPD), Pneumothorax, Sleep Apnea,  Tuberculosis Cardiovascular Patient has history of Congestive Heart Failure,  Hypertension, Peripheral Venous Disease Denies history of Angina, Arrhythmia, Coronary Artery Disease, Deep Vein Thrombosis, Hypotension, Myocardial Infarction, Peripheral Arterial Disease, Phlebitis, Vasculitis Gastrointestinal Denies history of Cirrhosis , Colitis, Crohnoos, Hepatitis A, Hepatitis B, Hepatitis C Endocrine Denies history of Type I Diabetes, Type II Diabetes Genitourinary Denies history of End Stage Renal Disease Immunological Denies history of Lupus Erythematosus, Raynaudoos, Scleroderma Integumentary (Skin) Denies history of History of Burn Musculoskeletal Patient has history of Osteoarthritis Denies history of Gout, Rheumatoid Arthritis, Osteomyelitis Neurologic Patient has history of Neuropathy - bila Denies history of Dementia, Quadriplegia, Paraplegia, Seizure Disorder Oncologic Denies history of Received Chemotherapy, Received Radiation Psychiatric Patient has history of Confinement Anxiety Denies history of Anorexia/bulimia Hospitalization/Surgery History - right arm surgery. - gastric bypass. - hysterectomy. - tonsillectomy. - hernia repair. - cervical myelopathy with spinal cord compression. Medical A Surgical History Notes nd Constitutional Symptoms (General Health) obesity Ear/Nose/Mouth/Throat seasonal allergies Cardiovascular aortic murmur, hyperlipidemia, hypertriglyceridemia Gastrointestinal UGI bleed, Endocrine hypothyroidism Objective Constitutional [name], [name] ([id number]) [id number] Page 8 of 12 Hypertensive, asymptomatic. no acute distress. Vitals Time Taken: 1:19 AM, Height: 65 in, Weight: 229 lbs, BMI: 38.1, Temperature: 98.0 F, Pulse: 76 bpm, Respiratory Rate: 20 breaths/min, Blood Pressure: 176/95 mmHg. Respiratory Normal work of breathing on room air. General Notes: 02/15/2023: The wound on her right lateral ankle  is a little bit smaller and shallower today. The surface remains tough but less fibrotic than on initial intake. She has a wound on her left anterior tibia that she says has been present for about a month. She has been trying to manage it on her own at home but as it has not improved, she wanted [place] to take over management of this site. It is fairly superficial, essentially a deep scrape. There is some slough on the surface. No concern for infection. Integumentary (Hair, Skin) Wound #6 status is Open. Original cause of wound was Gradually Appeared. The date acquired was: [name]/30/2023. The wound has been in treatment 19 weeks. The wound is located on the Right,Lateral Ankle. The wound measures 0.7cm length x 0.7cm width x 0.2cm depth; 0.385cm^2 area and 0.077cm^3 volume. There is Fat Layer (Subcutaneous Tissue) exposed. There is no tunneling or undermining noted. There is a medium amount of serous drainage noted. The wound margin is flat and intact. There is large (67-100%) red, pink granulation within the wound bed. There is a small (1-33%) amount of necrotic tissue within the wound bed including Adherent Slough. The periwound skin appearance had no abnormalities noted for texture. The periwound skin appearance exhibited: Hemosiderin Staining. The periwound skin appearance did not exhibit: Dry/Scaly, Maceration, Erythema. Periwound temperature was noted as No Abnormality. The periwound has tenderness on palpation. Wound #8 status is Open. Original cause of wound was Trauma. The date acquired was: 01/25/2023. The wound is located on the Left,Anterior Lower Leg. The wound measures 3cm length x 1.9cm width x 0.1cm depth; 4.477cm^2 area and 0.448cm^3 volume. There is Fat Layer (Subcutaneous Tissue) exposed. There is no tunneling or undermining noted. There is a medium amount of serous drainage noted. The wound margin is flat and intact. There is large (67-100%) red, pink granulation within the wound bed.  There is no necrotic tissue within the wound bed. The periwound skin appearance had no abnormalities noted for texture. The periwound skin appearance had no abnormalities noted for moisture. The periwound skin appearance had no abnormalities noted for color. 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 There was a Selective/Open Wound Non-Viable Tissue Debridement with a total area of 0.49 sq cm performed by Duanne Guess, MD. With the following instrument(s): Curette to remove Non-Viable tissue/material. Material removed includes [facility] after achieving pain control using Lidocaine 4% Topical Solution. No specimens were taken. A time out was conducted at 13:50, prior to the start of the procedure. A Minimum amount of bleeding was controlled with Pressure. The procedure was tolerated well. Post Debridement Measurements: 0.7cm length x 0.7cm width x 0.1cm depth; 0.038cm^3 volume. Character of Wound/Ulcer Post Debridement is stable. Severity of Tissue Post Debridement is: Fat layer exposed. Post procedure Diagnosis Wound #6: Same as Pre-Procedure General Notes: Scribed on behalf of Dr. Lady Gary by Brenton Grills RN.. Pre-procedure diagnosis of Wound #6 is a Venous Leg Ulcer located on the Right,Lateral Ankle . There was a Three Layer Compression Therapy Procedure by Brenton Grills, RN. Post procedure Diagnosis Wound #6: Same as Pre-Procedure Wound #8 Pre-procedure diagnosis of Wound #8 is a Venous Leg Ulcer located on the Left,Anterior Lower Leg .Severity of  Tissue Pre Debridement is: Fat layer exposed. There was a Selective/Open Wound Non-Viable Tissue Debridement with a total area of 5.7 sq cm performed by Duanne Guess, MD. With the following instrument(s): Curette to remove Non-Viable tissue/material. Material removed includes [facility] after achieving pain control using Lidocaine 4% Topical Solution. No specimens were taken. A time out was conducted at 13:50, prior to the start of the procedure. A Minimum amount of bleeding was controlled with Pressure. The procedure was tolerated well. Post Debridement Measurements: 3cm length x 1.9cm width x 0.1cm depth; 0.448cm^3 volume. Character of Wound/Ulcer Post Debridement is stable. Severity of Tissue Post Debridement is: Fat layer exposed. Post procedure Diagnosis Wound #8: Same as Pre-Procedure General Notes: Scribed for Dr. Lady Gary by Brenton Grills RN. Pre-procedure diagnosis of Wound #8 is a Venous Leg Ulcer located on the Left,Anterior Lower Leg . There was a Three Layer Compression Therapy Procedure by Brenton Grills, RN. Post procedure Diagnosis Wound #8: Same as Pre-Procedure Plan Follow-up Appointments: BARI, LEIB ([id number]) [id number] Page 9 of 12 Return Appointment in 1 week. - Dr. Lady Gary RM 2 Friday 02/23/23 at 12:30pm Anesthetic: (In clinic) Topical Lidocaine 4% applied to wound bed Bathing/ Shower/ Hygiene: May shower with protection but do not get wound dressing(s) wet. Protect dressing(s) with water repellant cover (for example, large plastic bag) or a cast cover and may then take shower. - may purchase cast protector at [facility], [facility] or [employer] Edema Control - Lymphedema / SCD / Other: Elevate legs to the level of the heart or above for 30 minutes daily and/or when sitting for 3-4 times a day throughout the day. Avoid standing for long periods of time. Patient to wear own compression stockings every day. Moisturize legs daily. WOUND #6: - Ankle  Wound Laterality: Right, Lateral Cleanser: Soap and Water 1 x Per Week/30 Days Discharge Instructions: May shower and wash wound with dial antibacterial soap and water prior to dressing change. Peri-Wound Care: Sween Lotion (Moisturizing lotion) 1 x Per Week/30 Days Discharge Instructions: Apply moisturizing lotion as directed Prim Dressing: Promogran Prisma Matrix, 4.34 (sq in) (silver collagen) 1 x Per Week/30  Days ary Discharge Instructions: Moisten collagen with saline or hydrogel Secondary Dressing: Woven Gauze Sponge, Non-Sterile 4x4 in 1 x Per Week/30 Days Discharge Instructions: Apply over primary dressing as directed. Secured With: Transpore Surgical T ape, 2x10 (in/yd) 1 x Per Week/30 Days Discharge Instructions: Secure dressing with tape as directed. Com pression Wrap: ThreePress (3 layer compression wrap) 1 x Per Week/30 Days Discharge Instructions: Apply three layer compression as directed. WOUND #8: - Lower Leg Wound Laterality: Left, Anterior Peri-Wound Care: Sween Lotion (Moisturizing lotion) 1 x Per Week/ Discharge Instructions: Apply moisturizing lotion as directed Prim Dressing: Maxorb Extra Ag+ Alginate Dressing, 2x2 (in/in) 1 x Per Week/ ary Discharge Instructions: Apply to wound bed as instructed Secondary Dressing: Woven Gauze Sponge, Non-Sterile 4x4 in 1 x Per Week/ Discharge Instructions: Apply over primary dressing as directed. Com pression Wrap: ThreePress (3 layer compression wrap) 1 x Per Week/ Discharge Instructions: Apply three layer compression as directed. 02/15/2023: The wound on her right lateral ankle is a little bit smaller and shallower today. The surface remains tough but less fibrotic than on initial intake. She has a wound on her left anterior tibia that she says has been present for about a month. She has been trying to manage it on her own at home but as it has not improved, she wanted [place] to take over management of this site. It is fairly  superficial, essentially a deep scrape. There is some slough on the surface. No concern for infection. I used a curette to debride slough from both wounds. Will use Prisma silver collagen to the right lateral ankle wound and silver alginate to the left anterior tibial wound. Bilateral 3 layer compression. Follow-up in 1 week. Electronic Signature(s) Signed: 02/15/2023 2:14:52 PM By: Duanne Guess MD FACS Entered By: Duanne Guess on 02/15/2023 14:14:52 -------------------------------------------------------------------------------- HxROS Details Patient Name: Date of Service: [name], Shalimar [name]. 02/15/2023 1:15 PM Medical Record Number: [id number] Patient Account Number: [id number] Date of Birth/Sex: Treating RN: [name] 20, [name] (77 y.o. F) Primary Care Provider: Jerel Shepherd, [name] [facility] [name] Other Clinician: Referring Provider: Treating Provider/Extender: [name], [name] Weeks in Treatment: 19 Information Obtained From Patient Constitutional Symptoms (General Health) Medical History: Past Medical History Notes: obesity Eyes Medical History: Positive for: Cataracts - mild Negative for: Glaucoma; Optic Neuritis Ear/Nose/Mouth/Throat Medical History: Negative for: Chronic sinus problems/congestion; Middle ear problems Past Medical History Notes: seasonal allergies [name], [name] ([id number]) [id number] Page [name] of 12 Hematologic/Lymphatic Medical History: Positive for: Anemia - iron deficiency; Lymphedema Negative for: Hemophilia; Human Immunodeficiency Virus; Sickle Cell Disease Respiratory Medical History: Negative for: Aspiration; Asthma; Chronic Obstructive Pulmonary Disease (COPD); Pneumothorax; Sleep Apnea; Tuberculosis Cardiovascular Medical History: Positive for: Congestive Heart Failure; Hypertension; Peripheral Venous Disease Negative for: Angina; Arrhythmia; Coronary Artery Disease; Deep Vein Thrombosis; Hypotension;  Myocardial Infarction; Peripheral Arterial Disease; Phlebitis; Vasculitis Past Medical History Notes: aortic murmur, hyperlipidemia, hypertriglyceridemia Gastrointestinal Medical History: Negative for: Cirrhosis ; Colitis; Crohns; Hepatitis A; Hepatitis B; Hepatitis C Past Medical History Notes: UGI bleed, Endocrine Medical History: Negative for: Type I Diabetes; Type II Diabetes Past Medical History Notes: hypothyroidism Genitourinary Medical History: Negative for: End Stage Renal Disease Immunological Medical History: Negative for: Lupus Erythematosus; Raynauds; Scleroderma Integumentary (Skin) Medical History: Negative for: History of Burn Musculoskeletal Medical History: Positive for: Osteoarthritis Negative for: Gout; Rheumatoid Arthritis; Osteomyelitis Neurologic Medical History: Positive for: Neuropathy - bila Negative for: Dementia; Quadriplegia; Paraplegia; Seizure Disorder Oncologic Medical History: Negative for: Received Chemotherapy; Received Radiation Psychiatric Medical History: Positive  for: Confinement Anxiety Negative for: Anorexia/bulimia HBO Extended History Items Eyes: Cataracts Immunizations FRANCIES, INCH ([id number]) [id number] Page 11 of 12 Pneumococcal Vaccine: Received Pneumococcal Vaccination: No Implantable Devices No devices added Hospitalization / Surgery History Type of Hospitalization/Surgery right arm surgery gastric bypass hysterectomy tonsillectomy hernia repair cervical myelopathy with spinal cord compression Family and Social History Cancer: Yes - Mother,Father; Diabetes: Yes - Father; Heart Disease: Yes - Father; Hereditary Spherocytosis: No; Hypertension: Yes - Father; Kidney Disease: No; Lung Disease: No; Seizures: No; Stroke: Yes - Mother; Thyroid Problems: No; Tuberculosis: No; Never smoker; Marital Status - Married; Alcohol Use: Daily - 1.5 bottles of wine daily; Drug Use: No History;  Caffeine Use: Daily - coffee; Financial Concerns: No; Food, Clothing or Shelter Needs: No; Support System Lacking: No; Transportation Concerns: No Electronic Signature(s) Signed: 02/15/2023 4:25:44 PM By: Duanne Guess MD FACS Entered By: Duanne Guess on 02/15/2023 14:13:50 -------------------------------------------------------------------------------- SuperBill Details Patient Name: Date of Service: [name], Modesta [name]. 02/15/2023 Medical Record Number: [id number] Patient Account Number: [id number] Date of Birth/Sex: Treating RN: [birth year] (77 y.o. F) Primary Care Provider: Jerel Shepherd, [name] [facility] [name] Other Clinician: Referring Provider: Treating Provider/Extender: [name], [name] Weeks in Treatment: 19 Diagnosis Coding ICD-[name] Codes Code Description L97.312 Non-pressure chronic ulcer of right ankle with fat layer exposed L97.821 Non-pressure chronic ulcer of other part of left lower leg limited to breakdown of skin I87.2 Venous insufficiency (chronic) (peripheral) I50.32 Chronic diastolic (congestive) heart failure E66.09 Other obesity due to excess calories Facility Procedures : CPT4 Code: [id number] Description: 97597 - DEBRIDE WOUND 1ST 20 SQ CM OR < ICD-[name] Diagnosis Description L97.312 Non-pressure chronic ulcer of right ankle with fat layer exposed L97.821 Non-pressure chronic ulcer of other part of left lower leg limited to breakdown o Modifier: f skin Quantity: 1 Physician Procedures : CPT4 Code Description Modifier [id number] - WC PHYS LEVEL 4 - EST PT 25 ICD-[name] Diagnosis Description L97.312 Non-pressure chronic ulcer of right ankle with fat layer exposed L97.821 Non-pressure chronic ulcer of other part of left lower leg limited  to breakdown of skin I87.2 Venous insufficiency (chronic) (peripheral) I50.32 Chronic diastolic (congestive) heart failure Quantity: 1 Electronic Signature(s) Signed: 02/15/2023 2:15:11 PM By: Duanne Guess MD FACS Entered  By: Duanne Guess on 02/15/2023 14:15:[name]

## 2023-03-15 NOTE — Progress Notes (Signed)
AVRIL, BUSSER ([id number]) [id number] Page 1 of 8 Visit Report for 02/15/2023 Arrival Information Details Patient Name: Date of Service: [name], [name] 02/15/2023 1:15 PM Medical Record Number: [id number] Patient Account Number: [id number] Date of Birth/Sex: Treating RN: Dec 29, 1945 (77 y.o. F) Primary Care Osborne Serio: Jerel [place], [place] [name] N Other Clinician: Referring Mena Simonis: Treating Naja Apperson/Extender: Jacklynn [place]ong, [place] [facility] N Weeks in Treatment: 19 Visit Information History Since Last Visit All ordered tests and consults were completed: No Patient Arrived: Gilmer Mor Added or deleted any medications: No Arrival Time: 13:18 Any [name] allergies or adverse reactions: No Accompanied By: spouse Had a fall or experienced change in No Transfer Assistance: None activities of daily living that may affect Patient Identification Verified: Yes risk of falls: Secondary Verification Process Completed: Yes Signs or symptoms of abuse/neglect since last visito No Patient Requires Transmission-Based Precautions: No Hospitalized since last visit: No Patient Has Alerts: No Implantable device outside of the clinic excluding No cellular tissue based products placed in the center since last visit: Pain Present Now: No Electronic Signature(s) Signed: 02/15/2023 5:17:55 PM By: Dayton Scrape Entered By: Dayton Scrape on 02/15/2023 13:19:20 -------------------------------------------------------------------------------- Compression Therapy Details Patient Name: Date of Service: [name], [name] 02/15/2023 1:15 PM Medical Record Number: [id number] Patient Account Number: [id number] Date of Birth/Sex: Treating RN: [birth year] (77 y.o. Gevena Mart Primary Care Sahalie Beth: Jerel Shepherd, [place] [facility] N Other Clinician: Referring Braxton Weisbecker: Treating Tevan Marian/Extender: Jacklynn [place]ong, [place] [facility] N Weeks in Treatment: 19 Compression Therapy Performed for Wound Assessment:  Wound #6 Right,Lateral Ankle Performed By: Leighton Parody, RN Compression Type: Three Layer Post Procedure Diagnosis Same as Pre-procedure Electronic Signature(s) Signed: 03/15/2023 1:55:51 PM By: Brenton Grills Entered By: Brenton Grills on 02/15/2023 13:51:04 -------------------------------------------------------------------------------- Compression Therapy Details Patient Name: Date of Service: [name], [name] 02/15/2023 1:15 PM Medical Record Number: [id number] Patient Account Number: [id number] Date of Birth/Sex: Treating RN: [birth year] (77 y.o. Gevena Mart Primary Care Nazaria Riesen: Jerel [place], [place] [name] N Other Clinician: Referring Mikenna Bunkley: Treating Hampton Wixom/Extender: Jacklynn Ganong, [place] [facility] N Weeks in Treatment: 19 Compression Therapy Performed for Wound Assessment: Wound #8 Left,Anterior Lower Leg Performed By: Leighton Parody, RN Compression Type: Three Layer Post Procedure Diagnosis Same as Pre-procedure LYLIA, KARN ([id number]) [id number] Page 2 of 8 Electronic Signature(s) Signed: 03/15/2023 1:55:51 PM By: Brenton Grills Entered By: Brenton Grills on 02/15/2023 13:51:04 -------------------------------------------------------------------------------- Encounter Discharge Information Details Patient Name: Date of Service: [name], [name] Jersey H. 02/15/2023 1:15 PM Medical Record Number: [id number] Patient Account Number: [id number] Date of Birth/Sex: Treating RN: 09/05/1946 (77 y.o. Gevena Mart Primary Care Mihira Tozzi: Jerel [place], [place] [name] N Other Clinician: Referring Worthy Boschert: Treating Chenise Mulvihill/Extender: Jacklynn Ganong, [place] [facility] N Weeks in Treatment: 52 Encounter Discharge Information Items Post Procedure Vitals Discharge Condition: Stable Temperature (F): 98.3 Ambulatory Status: Walker Pulse (bpm): 76 Discharge Destination: Home Respiratory Rate (breaths/min): 18 Transportation: Private  Auto Blood Pressure (mmHg): 176/95 Accompanied By: spouse Schedule Follow-up Appointment: Yes Clinical Summary of Care: Patient Declined Electronic Signature(s) Signed: 03/15/2023 1:55:51 PM By: Brenton Grills Entered By: Brenton Grills on 02/15/2023 14:26:01 -------------------------------------------------------------------------------- Lower Extremity Assessment Details Patient Name: Date of Service: [name], [name] 02/15/2023 1:15 PM Medical Record Number: [id number] Patient Account Number: [id number] Date of Birth/Sex: Treating RN: Jun 23, 1946 (77 y.o. Gevena Mart Primary Care Louvinia Cumbo: Jerel [place], [place] [name] N Other Clinician: Referring Ekam Bonebrake: Treating Kiowa Peifer/Extender: Jacklynn [place], [place] [facility] N Weeks in Treatment: 77 Electronic Signature(s)  Signed: 03/15/2023 1:55:51 PM By: Brenton Grills Entered By: Brenton Grills on 02/15/2023 13:33:46 -------------------------------------------------------------------------------- Multi Wound Chart Details Patient Name: Date of Service: [name], [name] Jersey H. 02/15/2023 1:15 PM Medical Record Number: [id number] Patient Account Number: [id number] Date of Birth/Sex: Treating RN: Feb 27, 1946 (77 y.o. F) Primary Care Eloisa Chokshi: Jerel [place], [place] [name] N Other Clinician: Referring Ruth Tully: Treating Milaina Sher/Extender: Jacklynn [place], [place] [facility] N Weeks in Treatment: 19 Vital Signs Height(in): 65 Pulse(bpm): 76 Weight(lbs): 229 Blood Pressure(mmHg): 176/95 Body Mass Index(BMI): 38.1 Temperature(F): 98.0 Respiratory Rate(breaths/min): 20 [6:Photos:] [N/A:N/A [id number] Page 3 of 8] Right, Lateral Ankle Left, Anterior Lower Leg N/A Wound Location: Gradually Appeared Trauma N/A Wounding Event: Venous Leg Ulcer Venous Leg Ulcer N/A Primary Etiology: Cataracts, Anemia, Lymphedema, Cataracts, Anemia, Lymphedema, N/A Comorbid History: Congestive Heart Failure, Congestive Heart  Failure, Hypertension, Peripheral Venous Hypertension, Peripheral Venous Disease, Osteoarthritis, Neuropathy, Disease, Osteoarthritis, Neuropathy, Confinement Anxiety Confinement Anxiety 09/25/2022 01/25/2023 N/A Date Acquired: 19 0 N/A Weeks of Treatment: Open Open N/A Wound Status: No No N/A Wound Recurrence: 0.7x0.7x0.2 3x1.9x0.1 N/A Measurements L x W x D (cm) 0.385 4.477 N/A A (cm) : rea 0.077 0.448 N/A Volume (cm) : 62.90% N/A N/A % Reduction in A rea: 26.00% N/A N/A % Reduction in Volume: Full Thickness Without Exposed Full Thickness Without Exposed N/A Classification: Support Structures Support Structures Medium Medium N/A Exudate A mount: Serous Serous N/A Exudate Type: Media planner N/A Exudate Color: Flat and Intact Flat and Intact N/A Wound Margin: Large (67-100%) Large (67-100%) N/A Granulation A mount: Red, Pink Red, Pink N/A Granulation Quality: Small (1-33%) None Present (0%) N/A Necrotic A mount: Fat Layer (Subcutaneous Tissue): Yes Fat Layer (Subcutaneous Tissue): Yes N/A Exposed Structures: Fascia: No Tendon: No Muscle: No Joint: No Bone: No Small (1-33%) N/A N/A Epithelialization: Debridement - Selective/Open Wound Debridement - Selective/Open Wound N/A Debridement: Pre-procedure Verification/Time Out 13:50 13:50 N/A Taken: Lidocaine 4% Topical Solution Lidocaine 4% Topical Solution N/A Pain Control: [employer] N/A Tissue Debrided: Non-Viable Tissue Non-Viable Tissue N/A Level: 0.49 5.7 N/A Debridement A (sq cm): rea Curette Curette N/A Instrument: Minimum Minimum N/A Bleeding: Pressure Pressure N/A Hemostasis A chieved: Procedure was tolerated well Procedure was tolerated well N/A Debridement Treatment Response: 0.7x0.7x0.1 3x1.9x0.1 N/A Post Debridement Measurements L x W x D (cm) 0.038 0.448 N/A Post Debridement Volume: (cm) No Abnormalities Noted No Abnormalities Noted N/A Periwound Skin Texture: Maceration: No No  Abnormalities Noted N/A Periwound Skin Moisture: Dry/Scaly: No Hemosiderin Staining: Yes No Abnormalities Noted N/A Periwound Skin Color: Erythema: No No Abnormality N/A N/A Temperature: Yes N/A N/A Tenderness on Palpation: Compression Therapy Compression Therapy N/A Procedures Performed: Debridement Debridement Treatment Notes Wound #6 (Ankle) Wound Laterality: Right, Lateral Cleanser Soap and Water Discharge Instruction: May shower and wash wound with dial antibacterial soap and water prior to dressing change. Peri-Wound Care Sween Lotion (Moisturizing lotion) Discharge Instruction: Apply moisturizing lotion as directed Topical Primary Dressing Promogran Prisma Matrix, 4.34 (sq in) (silver collagen) Discharge Instruction: Moisten collagen with saline or hydrogel Secondary Dressing Woven Gauze Sponge, Non-Sterile 4x4 in Discharge Instruction: Apply over primary dressing as directed. SAREE, KROGH ([id number]) [id number] Page 4 of 8 Secured With [employer] Surgical Tape, 2x10 (in/yd) Discharge Instruction: Secure dressing with tape as directed. Compression Wrap ThreePress (3 layer compression wrap) Discharge Instruction: Apply three layer compression as directed. Compression Stockings Add-Ons Wound #8 (Lower Leg) Wound Laterality: Left, Anterior Cleanser Peri-Wound Care Sween Lotion (Moisturizing lotion) Discharge Instruction: Apply moisturizing lotion as directed Topical Primary Dressing Maxorb Extra  Ag+ Alginate Dressing, 2x2 (in/in) Discharge Instruction: Apply to wound bed as instructed Secondary Dressing Woven Gauze Sponge, Non-Sterile 4x4 in Discharge Instruction: Apply over primary dressing as directed. Secured With Compression Wrap ThreePress (3 layer compression wrap) Discharge Instruction: Apply three layer compression as directed. Compression Stockings Add-Ons Electronic Signature(s) Signed: 02/15/2023 2:11:31 PM By:  Duanne Guess MD FACS Entered By: Duanne Guess on 02/15/2023 14:11:30 -------------------------------------------------------------------------------- Multi-Disciplinary Care Plan Details Patient Name: Date of Service: [name], [name] Jersey H. 02/15/2023 1:15 PM Medical Record Number: [id number] Patient Account Number: [id number] Date of Birth/Sex: Treating RN: 1946/09/19 (77 y.o. Gevena Mart Primary Care Desmin Daleo: Jerel Shepherd, [place] [facility] N Other Clinician: Referring Aurelia Gras: Treating Riyaan Heroux/Extender: Jacklynn Ganong, [place] [facility] N Weeks in Treatment: 10 Active Inactive Nutrition Nursing Diagnoses: Potential for alteratiion in Nutrition/Potential for imbalanced nutrition Goals: Patient/caregiver agrees to and verbalizes understanding of need to use nutritional supplements and/or vitamins as prescribed Date Initiated: 10/13/2022 Target Resolution Date: 02/25/2023 Goal Status: Active Interventions: Provide education on nutrition Treatment Activities: Dietary management education, guidance and counseling : 10/13/2022 JENESIS, MARTIN ([id number]) [phone].pdf Page 5 of 8 Education provided on Nutrition : 11/10/2022 Notes: Wound/Skin Impairment Nursing Diagnoses: Impaired tissue integrity Goals: Ulcer/skin breakdown will have a volume reduction of 30% by week 4 Date Initiated: 10/13/2022 Target Resolution Date: 02/25/2023 Goal Status: Active Interventions: Provide education on ulcer and skin care Notes: Electronic Signature(s) Signed: 03/15/2023 1:55:51 PM By: Brenton Grills Entered By: Brenton Grills on 02/15/2023 13:46:41 -------------------------------------------------------------------------------- Pain Assessment Details Patient Name: Date of Service: [name], [name] 02/15/2023 1:15 PM Medical Record Number: [id number] Patient Account Number: [id number] Date of Birth/Sex: Treating RN: 12-11-1945 (77 y.o. F) Primary Care Sharine Cadle:  Jerel Shepherd, [place] [name] N Other Clinician: Referring Orine Goga: Treating Johari Pinney/Extender: Jacklynn Ganong, [place] [facility] N Weeks in Treatment: 19 Active Problems Location of Pain Severity and Description of Pain Patient Has Paino Yes Site Locations Rate the pain. Current Pain Level: 2 Worst Pain Level: 10 Least Pain Level: 0 Tolerable Pain Level: 1 Pain Management and Medication Current Pain Management: Electronic Signature(s) Signed: 02/15/2023 5:17:55 PM By: Dayton Scrape Entered By: Dayton Scrape on 02/15/2023 13:20:03 -------------------------------------------------------------------------------- Patient/Caregiver Education Details Patient Name: Date of Service: [name], [name] 3/21/2024andnbsp1:15 PM Medical Record Number: [id number] Patient Account Number: [id number] Date of Birth/Gender: Treating RN: 1945/12/16 (77 y.o. Gevena Mart Primary Care Physician: Jerel Shepherd, [place] [name] N Other Clinician: RONELL, [name] ([id number]) [id number] Page 6 of 8 Referring Physician: Treating Physician/Extender: Jacklynn [place]ong, [place] [facility] N Weeks in Treatment: 75 Education Assessment Education Provided To: Patient and Caregiver Education Topics Provided Wound/Skin Impairment: Handouts: Caring for Your Ulcer Methods: Explain/Verbal Responses: Reinforcements needed, State content correctly Electronic Signature(s) Signed: 03/15/2023 1:55:51 PM By: Brenton Grills Entered By: Brenton Grills on 02/15/2023 13:47:11 -------------------------------------------------------------------------------- Wound Assessment Details Patient Name: Date of Service: [name], [name] 02/15/2023 1:15 PM Medical Record Number: [id number] Patient Account Number: [id number] Date of Birth/Sex: Treating RN: 1946-09-04 (77 y.o. F) Primary Care Catriona Dillenbeck: [place]rel Shepherd, [place] [facility] N Other Clinician: Referring Raedyn Klinck: Treating Jihan Mellette/Extender: Jacklynn Ganong, [place] [facility]  N Weeks in Treatment: 19 Wound Status Wound Number: 6 Primary Venous Leg Ulcer Etiology: Wound Location: Right, Lateral Ankle Wound Open Wounding Event: Gradually Appeared Status: Date Acquired: 09/25/2022 Comorbid Cataracts, Anemia, Lymphedema, Congestive Heart Failure, Weeks Of Treatment: 19 History: Hypertension, Peripheral Venous Disease, Osteoarthritis, Clustered Wound: No Neuropathy, Confinement Anxiety Photos Wound Measurements Length: (cm) 0.7 Width: (cm) 0.7 Depth: (cm)  0.2 Area: (cm) 0.385 Volume: (cm) 0.077 % Reduction in Area: 62.9% % Reduction in Volume: 26% Epithelialization: Small (1-33%) Tunneling: No Undermining: No Wound Description Classification: Full Thickness Without Exposed Support Wound Margin: Flat and Intact Exudate Amount: Medium Exudate Type: Serous Exudate Color: amber Structures Foul Odor After Cleansing: No Slough/Fibrino Yes Wound Bed Granulation Amount: Large (67-100%) Exposed Structure Granulation Quality: Red, Pink Fascia Exposed: No Necrotic Amount: Small (1-33%) Fat Layer (Subcutaneous Tissue) Exposed: Yes Oman, Amiee H ([id number]) [id number] Page 7 of 8 Necrotic Quality: Adherent Slough Tendon Exposed: No Muscle Exposed: No Joint Exposed: No Bone Exposed: No Periwound Skin Texture Texture Color No Abnormalities Noted: Yes No Abnormalities Noted: No Erythema: No Moisture Hemosiderin Staining: Yes No Abnormalities Noted: No Dry / Scaly: No Temperature / Pain Maceration: No Temperature: No Abnormality Tenderness on Palpation: Yes Electronic Signature(s) Signed: 03/15/2023 1:55:51 PM By: Brenton Grills Entered By: Brenton Grills on 02/15/2023 13:35:40 -------------------------------------------------------------------------------- Wound Assessment Details Patient Name: Date of Service: [name], [name] 02/15/2023 1:15 PM Medical Record Number: [id number] Patient Account Number:  [id number] Date of Birth/Sex: Treating RN: 1946/08/29 (77 y.o. Gevena Mart Primary Care Sai Zinn: Jerel [place], [place] [name] N Other Clinician: Referring Azariyah Luhrs: Treating Ervin Rothbauer/Extender: Jacklynn Ganong, [place] RGA N Weeks in Treatment: 19 Wound Status Wound Number: 8 Primary Venous Leg Ulcer Etiology: Wound Location: Left, Anterior Lower Leg Wound Open Wounding Event: Trauma Status: Date Acquired: 01/25/2023 Comorbid Cataracts, Anemia, Lymphedema, Congestive Heart Failure, Weeks Of Treatment: 0 History: Hypertension, Peripheral Venous Disease, Osteoarthritis, Clustered Wound: No Neuropathy, Confinement Anxiety Photos Wound Measurements Length: (cm) 3 Width: (cm) 1.9 Depth: (cm) 0.1 Area: (cm) 4.477 Volume: (cm) 0.448 % Reduction in Area: % Reduction in Volume: Tunneling: No Undermining: No Wound Description Classification: Full Thickness Without Exposed Support Structures Wound Margin: Flat and Intact Exudate Amount: Medium Exudate Type: Serous Exudate Color: amber Foul Odor After Cleansing: No Slough/Fibrino No Wound Bed Granulation Amount: Large (67-100%) Exposed Structure Granulation Quality: Red, Pink Fat Layer (Subcutaneous Tissue) Exposed: Yes Necrotic Amount: None Present (0%) Periwound Skin Texture Vanallen, Anam H ([id number]) [id number] Page 8 of 8 Texture Color No Abnormalities Noted: Yes No Abnormalities Noted: Yes Moisture No Abnormalities Noted: Yes Electronic Signature(s) Signed: 03/15/2023 1:55:51 PM By: Brenton Grills Entered By: Brenton Grills on 02/15/2023 13:42:27 -------------------------------------------------------------------------------- Vitals Details Patient Name: Date of Service: [name], Audrena H. 02/15/2023 1:15 PM Medical Record Number: [id number] Patient Account Number: [id number] Date of Birth/Sex: Treating RN: 1946-09-13 (77 y.o. F) Primary Care Aylssa Herrig: Jerel Shepherd, [place] [facility] N Other  Clinician: Referring Rayce Brahmbhatt: Treating Ercell Razon/Extender: Jacklynn Ganong, [place] [facility] N Weeks in Treatment: 19 Vital Signs Time Taken: 01:19 Temperature (F): 98.0 Height (in): 65 Pulse (bpm): 76 Weight (lbs): 229 Respiratory Rate (breaths/min): 20 Body Mass Index (BMI): 38.1 Blood Pressure (mmHg): 176/95 Reference Range: 80 - 120 mg / dl Electronic Signature(s) Signed: 02/15/2023 5:17:55 PM By: Dayton Scrape Entered By: Dayton Scrape on 02/15/2023 13:19:43

## 2023-03-20 ENCOUNTER — Telehealth: Payer: Self-pay

## 2023-03-20 NOTE — Telephone Encounter (Signed)
Called patient to schedule Medicare Annual Wellness Visit (AWV). Left message for patient to call back and schedule Medicare Annual Wellness Visit (AWV).  Last date of AWV: 11/29/21  Please schedule an appointment at any time on Annual Wellness Visit schedule.

## 2023-04-02 ENCOUNTER — Encounter (HOSPITAL_BASED_OUTPATIENT_CLINIC_OR_DEPARTMENT_OTHER): Payer: PPO | Attending: General Surgery | Admitting: General Surgery

## 2023-04-02 ENCOUNTER — Other Ambulatory Visit: Payer: Self-pay | Admitting: Nurse Practitioner

## 2023-04-02 DIAGNOSIS — Z86718 Personal history of other venous thrombosis and embolism: Secondary | ICD-10-CM | POA: Insufficient documentation

## 2023-04-02 DIAGNOSIS — L97312 Non-pressure chronic ulcer of right ankle with fat layer exposed: Secondary | ICD-10-CM | POA: Diagnosis not present

## 2023-04-02 DIAGNOSIS — Z09 Encounter for follow-up examination after completed treatment for conditions other than malignant neoplasm: Secondary | ICD-10-CM | POA: Insufficient documentation

## 2023-04-02 DIAGNOSIS — E785 Hyperlipidemia, unspecified: Secondary | ICD-10-CM | POA: Diagnosis not present

## 2023-04-02 DIAGNOSIS — I110 Hypertensive heart disease with heart failure: Secondary | ICD-10-CM | POA: Insufficient documentation

## 2023-04-02 DIAGNOSIS — E039 Hypothyroidism, unspecified: Secondary | ICD-10-CM | POA: Diagnosis not present

## 2023-04-02 DIAGNOSIS — E668 Other obesity: Secondary | ICD-10-CM | POA: Insufficient documentation

## 2023-04-02 DIAGNOSIS — Z9884 Bariatric surgery status: Secondary | ICD-10-CM | POA: Insufficient documentation

## 2023-04-02 DIAGNOSIS — K219 Gastro-esophageal reflux disease without esophagitis: Secondary | ICD-10-CM

## 2023-04-02 DIAGNOSIS — Z6838 Body mass index (BMI) 38.0-38.9, adult: Secondary | ICD-10-CM | POA: Insufficient documentation

## 2023-04-02 DIAGNOSIS — I872 Venous insufficiency (chronic) (peripheral): Secondary | ICD-10-CM | POA: Diagnosis not present

## 2023-04-02 DIAGNOSIS — I5032 Chronic diastolic (congestive) heart failure: Secondary | ICD-10-CM | POA: Diagnosis not present

## 2023-04-02 NOTE — Progress Notes (Signed)
[name] ([id number]) [id number] Page 1 of 10 Visit Report for 04/02/2023 Chief Complaint Document Details Patient Name: Date of Service: [name], [name] 04/02/2023 12:30 PM Medical Record Number: [id number] Patient Account Number: [id number] Date of Birth/Sex: Treating RN: 07-14-1946 (77 y.o. F) Primary Care Provider: Jerel Shepherd, Tonie Griffith [facility] N Other Clinician: Referring Provider: Treating Provider/Extender: Jacklynn Ganong, [place] RGA N Weeks in Treatment: 25 Information Obtained from: Patient Chief Complaint Bilateral LE Ulcers 10/05/2022: RLE ulcer Electronic Signature(s) Signed: 04/02/2023 1:05:16 PM By: Duanne Guess MD FACS Entered By: Duanne Guess on 04/02/2023 13:05:16 -------------------------------------------------------------------------------- HPI Details Patient Name: Date of Service: [name], [name] 04/02/2023 12:30 PM Medical Record Number: [id number] Patient Account Number: [id number] Date of Birth/Sex: Treating RN: 08-28-1946 (77 y.o. F) Primary Care Provider: Jerel Shepherd, Tonie Griffith [facility] N Other Clinician: Referring Provider: Treating Provider/Extender: Jacklynn Ganong, [place] RGA N Weeks in Treatment: 25 History of Present Illness HPI Description: 07/09/18 on evaluation today patient presents for bilateral lower extremity ulcers. She has a past medical history significant for chronic venous stasis, hypertension, and lower extremity edema with associated skin changes. Unfortunately she tells me that roughly 3 weeks ago she noted ulcers on the left lower extremity which started given her trouble. Subsequently she ended up proceeding with applying mupirocin ointment and was keeping this open air for the most part. She states that it would scab over somewhat and then reopened. The wounds have been training quite significantly. Fortunately there does not appear to be any evidence of overt infection there is some erythematous type  skin changes but I believe this is likely stasis dermatitis nothing more. Fortunately she has no evidence of any systemic infection. No fevers, chills, nausea, or vomiting noted at this time. She has been recommended before to wear compression stockings but she does not wear them on a regular basis. She has never been suggested to get Juxta-Lite compression wraps she was not aware of what these were. Patient had venous studies performed on 11/09/17 which showed no lower surety DVT or deep vein insufficiency noted. She did not have evaluation however of the superficial veins. There was full compressibility noted throughout. Her arterial study which was performed on 11/18/17 actually showed that she has an ABI on the right of 1.17 with a TBI of 1.59 along with an ABI of 1.12 on the left and a TBI of 0.99. 07/17/18 on evaluation today patient appears to be doing much better in regard to her left lower Trinity ulcers. She's been tolerating the dressing changes without complication. There does not appear to be any evidence of infection which is good news. Overall I'm very pleased with the progress at this time. 07/24/18 on evaluation today patient's wound actually appears to be completely healed at this time. Fortunately there does not appear to be any evidence of infection which is good news. Overall I'm very happy with the progress that has been made. She did get her Velcro compression wraps. READMISSION 12/31/2020 This is a patient we had in this clinic in 2019 felt to have chronic venous insufficiency wounds on her lower extremities. She was discharged with bilateral compression stockings. She tells [place] that she never really wore them or at best wore them sparingly. She is back in clinic today having a ulcer on her left lateral lower extremity since October and one on the right anterior for the last 2 weeks. I do not know that she is doing any primary dressing on these certainly  not wearing any form of  compression. She is already been evaluated by vein and vascular on 11/15/2020. They noted that she did have ulcers on the left leg in October that healed with a need Unna boot. She had a reflux study on 12/20 that did not show evidence of a DVT or venous reflux bilaterally. They was not felt that she had arterial insufficiency based on the fact that she had brisk biphasic Doppler signals in the bilateral DP PT. The patient comes in today with superficial wounds on the right anterior mid tibia and the left lateral calf. These are small appear to have healthy surfaces. Past medical history includes cervical and upper thoracic spine surgery on 7/21, hernia repair early in January, hypertension, hyperlipidemia, hypothyroidism, gastric bypass surgery, DVT in the right peroneal artery and left popliteal and peroneal veins. Next problem CHF. She does not have diabetes ABIs in our clinic were 1.1 on the right and 1.2 on the lower 2/11; the patient's wound on the left lateral and right anterior lower leg are closed for edema control is good. She has an assortment of old juxta lite stockings with old liners. She does not like to wear anything in particular under the compression part of the juxta lite stocking. I think she is going to do exactly what she wants. [name] ([id number]) [id number] Page 2 of 10 READMISSION 10/05/2022 This is a 77 year old woman last seen in our clinic about 18 months ago. She has a history of venous stasis and stasis dermatitis. A reflux study done in 2021 did not show any evidence of venous reflux. She says about 3 weeks ago, she noticed an ulcer on her right lateral malleolus. She was applying triple antibiotic ointment to it for a while and then was soaking it in Epsom salts. She reports that she usually wears compression stockings, but that they were rubbing so she did not wear 1 on her right leg since the wound appeared, she has a stocking  with the toe and heel cut out applied to her left leg. On examination, she has changes of chronic stasis dermatitis on the right. There is a shallow ulcer overlying the lateral malleolus. The fat layer is exposed. It is a little bit desiccated but fairly clean with just some light eschar and slough present. No malodor or purulent drainage. 10/13/2022: The wound is a little smaller today. Although the moisture balance is better than last week, it is still a bit dry. There is still some slough accumulation on the surface. 10/25/2022: The wound continues to contract. There is still thick layer of slough on the surface. The moisture balance of the wound is better, but the periwound skin is slightly macerated. 11/02/2022: The wound is smaller and shallower today. There is still slough on the surface, but the periwound skin is intact. The wound surface is still drier than ideal. 12/15; wound is about the same size minimal depth. Under illumination some debris on the surface she has been using Prisma 11/16/2022: The leg wrap became saturated when her makeshift cast protector leaked. The wound is a little bit larger today, as result of moisture-related tissue breakdown. The surface remains fairly fibrotic. 11/30/2022: The wound measured a little bit larger today secondary to additional moisture related periwound breakdown. There is slough on the wound surface. Still fairly fibrotic underneath. 12/05/2022: The ankle wound is a little smaller but still has slough accumulation. She has been approved for Apligraf and we are going to apply that today.  12/15/2022: The wound is smaller again today. Minimal slough. Edema control is good. 12/29/2022: The wound continues to contract. The surface is a little bit less fibrotic today. Light slough accumulation. 01/12/2023: The wound is smaller and has a better fill of granulation tissue. There is just a little slough on the surface and a small remaining area in the center that  is still fibrotic. 01/26/2023: The wound measured larger for some reason today, but on visual inspection it appears about the same size. There is more granulation tissue and just a very small area in the center that remains fibrotic. Minimal slough. 02/15/2023: The wound on her right lateral ankle is a little bit smaller and shallower today. The surface remains tough but less fibrotic than on initial intake. She has a wound on her left anterior tibia that she says has been present for about a month. She has been trying to manage it on her own at home but as it has not improved, she wanted [place] to take over management of this site. It is fairly superficial, essentially a deep scrape. There is some slough on the surface. No concern for infection. 02/23/2023: The wound on her left anterior tibial surface is nearly closed underneath the layer of eschar. The right lateral ankle wound is about the same size but a little bit shallower today. She says it has been more tender over the week and indeed, there is some erythema and swelling present. 03/02/2023: The wound on her left anterior tibia is closed. The right lateral ankle wound is shallower with a more healthy-looking surface. 03/09/2023: The right lateral ankle wound has contracted and the surface is more viable-looking. There is some slough and eschar buildup. Edema control is excellent. 04/02/2023: The right lateral ankle wound is about half the size that it was at her previous visit. There is good granulation tissue coming through in buds. No slough or eschar present. Electronic Signature(s) Signed: 04/02/2023 1:05:52 PM By: Duanne Guess MD FACS Entered By: Duanne Guess on 04/02/2023 13:05:52 -------------------------------------------------------------------------------- Physical Exam Details Patient Name: Date of Service: [name], Stark Bray H. 04/02/2023 12:30 PM Medical Record Number: [id number] Patient Account Number: [id number] Date of Birth/Sex:  Treating RN: 07/09/1946 (77 y.o. F) Primary Care Provider: Jerel Shepherd, Tonie Griffith [facility] N Other Clinician: Referring Provider: Treating Provider/Extender: Jacklynn Ganong, [place] RGA N Weeks in Treatment: 25 Constitutional Hypertensive, asymptomatic. . . . no acute distress. Respiratory Normal work of breathing on room air. Notes 04/02/2023: The right lateral ankle wound is about half the size that it was at her previous visit. There is good granulation tissue coming through in buds. No slough or eschar present. Electronic Signature(s) Signed: 04/02/2023 1:06:22 PM By: Duanne Guess MD FACS [place], [place] H [phone]: Duanne Guess MD FACS [id number] Page 3 of 10 Signed: 04/02/2023 1:06:22 PM Entered By: Duanne Guess on 04/02/2023 13:06:22 -------------------------------------------------------------------------------- Physician Orders Details Patient Name: Date of Service: KAMMI, SEGAL 04/02/2023 12:30 PM Medical Record Number: [id number] Patient Account Number: [id number] Date of Birth/Sex: Treating RN: 08/24/1946 (77 y.o. Billy Coast, [name] Primary Care Provider: Jerel Shepherd, [place] [facility] N Other Clinician: Referring Provider: Treating Provider/Extender: Jacklynn Ganong, [place] RGA N Weeks in Treatment: 25 Verbal / Phone Orders: No Diagnosis Coding ICD-10 Coding Code Description L97.312 Non-pressure chronic ulcer of right ankle with fat layer exposed I87.2 Venous insufficiency (chronic) (peripheral) I50.32 Chronic diastolic (congestive) heart failure E66.09 Other obesity due to excess calories Follow-up Appointments ppointment in 1 week. - Dr. Lady Gary RM  2 Return A Anesthetic (In clinic) Topical Lidocaine 4% applied to wound bed Bathing/ Shower/ Hygiene May shower with protection but do not get wound dressing(s) wet. Protect dressing(s) with water repellant cover (for example, large plastic bag) or a cast cover and may then take  shower. - may purchase cast protector at [facility], [facility] or [employer] Edema Control - Lymphedema / SCD / Other Bilateral Lower Extremities Elevate legs to the level of the heart or above for 30 minutes daily and/or when sitting for 3-4 times a day throughout the day. Avoid standing for long periods of time. Patient to wear own compression stockings every day. Moisturize legs daily. Wound Treatment Wound #6 - Ankle Wound Laterality: Right, Lateral Cleanser: Soap and Water 1 x Per Week/30 Days Discharge Instructions: May shower and wash wound with dial antibacterial soap and water prior to dressing change. Peri-Wound Care: Sween Lotion (Moisturizing lotion) 1 x Per Week/30 Days Discharge Instructions: Apply moisturizing lotion as directed Topical: Mupirocin Ointment 1 x Per Week/30 Days Discharge Instructions: Apply Mupirocin (Bactroban) as instructed Prim Dressing: Promogran Prisma Matrix, 4.34 (sq in) (silver collagen) 1 x Per Week/30 Days ary Discharge Instructions: Moisten collagen with saline or hydrogel Secondary Dressing: Woven Gauze Sponge, Non-Sterile 4x4 in 1 x Per Week/30 Days Discharge Instructions: Apply over primary dressing as directed. Secured With: Transpore Surgical Tape, 2x10 (in/yd) 1 x Per Week/30 Days Discharge Instructions: Secure dressing with tape as directed. Compression Wrap: Urgo K2, two layer compression system, regular 1 x Per Week/30 Days Discharge Instructions: Apply Urgo K2 as directed (alternative to 4 layer compression). Electronic Signature(s) Signed: 04/02/2023 1:17:35 PM By: Duanne Guess MD FACS Entered By: Duanne Guess on 04/02/2023 13:06:36 Problem List Details -------------------------------------------------------------------------------- [name] ([id number]) [id number] Page 4 of 10 Patient Name: Date of Service: TARHONDA, LANDRY 04/02/2023 12:30 PM Medical Record Number: [id number] Patient Account  Number: [id number] Date of Birth/Sex: Treating RN: 1946-07-15 (77 y.o. Tommye Standard Primary Care Provider: Jerel Shepherd, [place] [facility] N Other Clinician: Referring Provider: Treating Provider/Extender: Jacklynn Ganong, [place] RGA N Weeks in Treatment: 25 Active Problems ICD-10 Encounter Code Description Active Date MDM Diagnosis L97.312 Non-pressure chronic ulcer of right ankle with fat layer exposed 10/05/2022 No Yes I87.2 Venous insufficiency (chronic) (peripheral) 10/05/2022 No Yes I50.32 Chronic diastolic (congestive) heart failure 10/05/2022 No Yes E66.09 Other obesity due to excess calories 10/05/2022 No Yes Inactive Problems Resolved Problems ICD-10 Code Description Active Date Resolved Date L97.821 Non-pressure chronic ulcer of other part of left lower leg limited to breakdown of skin 02/15/2023 02/15/2023 Electronic Signature(s) Signed: 04/02/2023 1:05:04 PM By: Duanne Guess MD FACS Entered By: Duanne Guess on 04/02/2023 13:05:03 -------------------------------------------------------------------------------- Progress Note Details Patient Name: Date of Service: [name], Fronnie H. 04/02/2023 12:30 PM Medical Record Number: [id number] Patient Account Number: [id number] Date of Birth/Sex: Treating RN: [birth year] (77 y.o. F) Primary Care Provider: Jerel Shepherd, [place] [facility] N Other Clinician: Referring Provider: Treating Provider/Extender: Jacklynn Ganong, [place] RGA N Weeks in Treatment: 25 Subjective Chief Complaint Information obtained from Patient Bilateral LE Ulcers 10/05/2022: RLE ulcer History of Present Illness (HPI) 07/09/18 on evaluation today patient presents for bilateral lower extremity ulcers. She has a past medical history significant for chronic venous stasis, hypertension, and lower extremity edema with associated skin changes. Unfortunately she tells me that roughly 3 weeks ago she noted ulcers on the left lower extremity which started given her trouble.  Subsequently she ended up proceeding with applying mupirocin ointment and was keeping  this open air for the most part. She states that it would scab over somewhat and then reopened. The wounds have been training quite significantly. Fortunately there does not appear to be any evidence of overt infection there is some erythematous type skin changes but I believe this is likely stasis dermatitis nothing more. Fortunately she has no evidence of any systemic infection. No fevers, chills, nausea, or vomiting noted at this time. She has been recommended before to wear compression stockings but she does not wear them on a regular basis. She has never been suggested to get Juxta-Lite compression wraps she was not aware of what these were. Patient had venous studies performed on 11/09/17 which showed no lower surety DVT or deep vein insufficiency noted. She did not have evaluation however of the superficial veins. There was full compressibility noted throughout. Her arterial study which was performed on 11/18/17 actually showed that she has an ABI on the right of 1.17 with a TBI of 1.59 along with an ABI of 1.12 on the left and a TBI of 0.99. POORVI, KAPALA ([id number]) [id number] Page 5 of 10 07/17/18 on evaluation today patient appears to be doing much better in regard to her left lower Trinity ulcers. She's been tolerating the dressing changes without complication. There does not appear to be any evidence of infection which is good news. Overall I'm very pleased with the progress at this time. 07/24/18 on evaluation today patient's wound actually appears to be completely healed at this time. Fortunately there does not appear to be any evidence of infection which is good news. Overall I'm very happy with the progress that has been made. She did get her Velcro compression wraps. READMISSION 12/31/2020 This is a patient we had in this clinic in 2019 felt to have chronic venous  insufficiency wounds on her lower extremities. She was discharged with bilateral compression stockings. She tells [place] that she never really wore them or at best wore them sparingly. She is back in clinic today having a ulcer on her left lateral lower extremity since October and one on the right anterior for the last 2 weeks. I do not know that she is doing any primary dressing on these certainly not wearing any form of compression. She is already been evaluated by vein and vascular on 11/15/2020. They noted that she did have ulcers on the left leg in October that healed with a need Unna boot. She had a reflux study on 12/20 that did not show evidence of a DVT or venous reflux bilaterally. They was not felt that she had arterial insufficiency based on the fact that she had brisk biphasic Doppler signals in the bilateral DP PT. The patient comes in today with superficial wounds on the right anterior mid tibia and the left lateral calf. These are small appear to have healthy surfaces. Past medical history includes cervical and upper thoracic spine surgery on 7/21, hernia repair early in January, hypertension, hyperlipidemia, hypothyroidism, gastric bypass surgery, DVT in the right peroneal artery and left popliteal and peroneal veins. Next problem CHF. She does not have diabetes ABIs in our clinic were 1.1 on the right and 1.2 on the lower 2/11; the patient's wound on the left lateral and right anterior lower leg are closed for edema control is good. She has an assortment of old juxta lite stockings with old liners. She does not like to wear anything in particular under the compression part of the juxta lite stocking. I think she is going  to do exactly what she wants. READMISSION 10/05/2022 This is a 77 year old woman last seen in our clinic about 18 months ago. She has a history of venous stasis and stasis dermatitis. A reflux study done in 2021 did not show any evidence of venous reflux. She says about  3 weeks ago, she noticed an ulcer on her right lateral malleolus. She was applying triple antibiotic ointment to it for a while and then was soaking it in Epsom salts. She reports that she usually wears compression stockings, but that they were rubbing so she did not wear 1 on her right leg since the wound appeared, she has a stocking with the toe and heel cut out applied to her left leg. On examination, she has changes of chronic stasis dermatitis on the right. There is a shallow ulcer overlying the lateral malleolus. The fat layer is exposed. It is a little bit desiccated but fairly clean with just some light eschar and slough present. No malodor or purulent drainage. 10/13/2022: The wound is a little smaller today. Although the moisture balance is better than last week, it is still a bit dry. There is still some slough accumulation on the surface. 10/25/2022: The wound continues to contract. There is still thick layer of slough on the surface. The moisture balance of the wound is better, but the periwound skin is slightly macerated. 11/02/2022: The wound is smaller and shallower today. There is still slough on the surface, but the periwound skin is intact. The wound surface is still drier than ideal. 12/15; wound is about the same size minimal depth. Under illumination some debris on the surface she has been using Prisma 11/16/2022: The leg wrap became saturated when her makeshift cast protector leaked. The wound is a little bit larger today, as result of moisture-related tissue breakdown. The surface remains fairly fibrotic. 11/30/2022: The wound measured a little bit larger today secondary to additional moisture related periwound breakdown. There is slough on the wound surface. Still fairly fibrotic underneath. 12/05/2022: The ankle wound is a little smaller but still has slough accumulation. She has been approved for Apligraf and we are going to apply that today. 12/15/2022: The wound is smaller  again today. Minimal slough. Edema control is good. 12/29/2022: The wound continues to contract. The surface is a little bit less fibrotic today. Light slough accumulation. 01/12/2023: The wound is smaller and has a better fill of granulation tissue. There is just a little slough on the surface and a small remaining area in the center that is still fibrotic. 01/26/2023: The wound measured larger for some reason today, but on visual inspection it appears about the same size. There is more granulation tissue and just a very small area in the center that remains fibrotic. Minimal slough. 02/15/2023: The wound on her right lateral ankle is a little bit smaller and shallower today. The surface remains tough but less fibrotic than on initial intake. She has a wound on her left anterior tibia that she says has been present for about a month. She has been trying to manage it on her own at home but as it has not improved, she wanted [place] to take over management of this site. It is fairly superficial, essentially a deep scrape. There is some slough on the surface. No concern for infection. 02/23/2023: The wound on her left anterior tibial surface is nearly closed underneath the layer of eschar. The right lateral ankle wound is about the same size but a little bit shallower today.  She says it has been more tender over the week and indeed, there is some erythema and swelling present. 03/02/2023: The wound on her left anterior tibia is closed. The right lateral ankle wound is shallower with a more healthy-looking surface. 03/09/2023: The right lateral ankle wound has contracted and the surface is more viable-looking. There is some slough and eschar buildup. Edema control is excellent. 04/02/2023: The right lateral ankle wound is about half the size that it was at her previous visit. There is good granulation tissue coming through in buds. No slough or eschar present. Patient History Information obtained from Patient. Family  History Cancer - Mother,Father, Diabetes - Father, Heart Disease - Father, Hypertension - Father, Stroke - Mother, No family history of Hereditary Spherocytosis, Kidney Disease, Lung Disease, Seizures, Thyroid Problems, Tuberculosis. Social History Never smoker, Marital Status - Married, Alcohol Use - Daily - 1.5 bottles of wine daily, Drug Use - No History, Caffeine Use - Daily - coffee. KAMYRI, DAUTRICH ([id number]) [id number] Page 6 of 10 Medical History Eyes Patient has history of Cataracts - mild Denies history of Glaucoma, Optic Neuritis Ear/Nose/Mouth/Throat Denies history of Chronic sinus problems/congestion, Middle ear problems Hematologic/Lymphatic Patient has history of Anemia - iron deficiency, Lymphedema Denies history of Hemophilia, Human Immunodeficiency Virus, Sickle Cell Disease Respiratory Denies history of Aspiration, Asthma, Chronic Obstructive Pulmonary Disease (COPD), Pneumothorax, Sleep Apnea, Tuberculosis Cardiovascular Patient has history of Congestive Heart Failure, Hypertension, Peripheral Venous Disease Denies history of Angina, Arrhythmia, Coronary Artery Disease, Deep Vein Thrombosis, Hypotension, Myocardial Infarction, Peripheral Arterial Disease, Phlebitis, Vasculitis Gastrointestinal Denies history of Cirrhosis , Colitis, Crohnoos, Hepatitis A, Hepatitis B, Hepatitis C Endocrine Denies history of Type I Diabetes, Type II Diabetes Genitourinary Denies history of End Stage Renal Disease Immunological Denies history of Lupus Erythematosus, Raynaudoos, Scleroderma Integumentary (Skin) Denies history of History of Burn Musculoskeletal Patient has history of Osteoarthritis Denies history of Gout, Rheumatoid Arthritis, Osteomyelitis Neurologic Patient has history of Neuropathy - bila Denies history of Dementia, Quadriplegia, Paraplegia, Seizure Disorder Oncologic Denies history of Received Chemotherapy, Received  Radiation Psychiatric Patient has history of Confinement Anxiety Denies history of Anorexia/bulimia Hospitalization/Surgery History - right arm surgery. - gastric bypass. - hysterectomy. - tonsillectomy. - hernia repair. - cervical myelopathy with spinal cord compression. Medical A Surgical History Notes nd Constitutional Symptoms (General Health) obesity Ear/Nose/Mouth/Throat seasonal allergies Cardiovascular aortic murmur, hyperlipidemia, hypertriglyceridemia Gastrointestinal UGI bleed, Endocrine hypothyroidism Objective Constitutional Hypertensive, asymptomatic. no acute distress. Vitals Time Taken: 12:46 PM, Height: 65 in, Weight: 229 lbs, BMI: 38.1, Temperature: 97.8 F, Pulse: 72 bpm, Respiratory Rate: 18 breaths/min, Blood Pressure: 176/71 mmHg. Respiratory Normal work of breathing on room air. General Notes: 04/02/2023: The right lateral ankle wound is about half the size that it was at her previous visit. There is good granulation tissue coming through in buds. No slough or eschar present. Integumentary (Hair, Skin) Wound #6 status is Open. Original cause of wound was Gradually Appeared. The date acquired was: 09/25/2022. The wound has been in treatment 25 weeks. The wound is located on the Right,Lateral Ankle. The wound measures 0.6cm length x 0.5cm width x 0.1cm depth; 0.236cm^2 area and 0.024cm^3 volume. There is Fat Layer (Subcutaneous Tissue) exposed. There is no tunneling or undermining noted. There is a medium amount of serous drainage noted. The wound margin is flat and intact. There is large (67-100%) red, pink, hyper - granulation within the wound bed. There is a small (1-33%) amount of necrotic tissue within the wound bed including Adherent Slough. The periwound  skin appearance had no abnormalities noted for texture. The periwound skin appearance exhibited: Hemosiderin Staining. The periwound skin appearance did not exhibit: Dry/Scaly, Maceration, Erythema.  Periwound temperature was noted as No Abnormality. CECILA, HACKBARTH ([id number]) [id number] Page 7 of 10 Assessment Active Problems ICD-10 Non-pressure chronic ulcer of right ankle with fat layer exposed Venous insufficiency (chronic) (peripheral) Chronic diastolic (congestive) heart failure Other obesity due to excess calories Procedures Wound #6 Pre-procedure diagnosis of Wound #6 is a Venous Leg Ulcer located on the Right,Lateral Ankle . There was a Double Layer Compression Therapy Procedure by Zenaida Deed, RN. Post procedure Diagnosis Wound #6: Same as Pre-Procedure Notes: urgo lite. Plan Follow-up Appointments: Return Appointment in 1 week. - Dr. Lady Gary RM 2 Anesthetic: (In clinic) Topical Lidocaine 4% applied to wound bed Bathing/ Shower/ Hygiene: May shower with protection but do not get wound dressing(s) wet. Protect dressing(s) with water repellant cover (for example, large plastic bag) or a cast cover and may then take shower. - may purchase cast protector at [facility], [facility] or [employer] Edema Control - Lymphedema / SCD / Other: Elevate legs to the level of the heart or above for 30 minutes daily and/or when sitting for 3-4 times a day throughout the day. Avoid standing for long periods of time. Patient to wear own compression stockings every day. Moisturize legs daily. WOUND #6: - Ankle Wound Laterality: Right, Lateral Cleanser: Soap and Water 1 x Per Week/30 Days Discharge Instructions: May shower and wash wound with dial antibacterial soap and water prior to dressing change. Peri-Wound Care: Sween Lotion (Moisturizing lotion) 1 x Per Week/30 Days Discharge Instructions: Apply moisturizing lotion as directed Topical: Mupirocin Ointment 1 x Per Week/30 Days Discharge Instructions: Apply Mupirocin (Bactroban) as instructed Prim Dressing: Promogran Prisma Matrix, 4.34 (sq in) (silver collagen) 1 x Per Week/30 Days ary Discharge  Instructions: Moisten collagen with saline or hydrogel Secondary Dressing: Woven Gauze Sponge, Non-Sterile 4x4 in 1 x Per Week/30 Days Discharge Instructions: Apply over primary dressing as directed. Secured With: Transpore Surgical T ape, 2x10 (in/yd) 1 x Per Week/30 Days Discharge Instructions: Secure dressing with tape as directed. Com pression Wrap: Urgo K2, two layer compression system, regular 1 x Per Week/30 Days Discharge Instructions: Apply Urgo K2 as directed (alternative to 4 layer compression). 04/02/2023: The right lateral ankle wound is about half the size that it was at her previous visit. There is good granulation tissue coming through in buds. No slough or eschar present. No debridement was necessary today. We will continue topical mupirocin with Prisma silver collagen and 4-layer compression equivalent. Follow-up in 1 week. Electronic Signature(s) Signed: 04/02/2023 1:07:06 PM By: Duanne Guess MD FACS Entered By: Duanne Guess on 04/02/2023 13:07:06 -------------------------------------------------------------------------------- HxROS Details Patient Name: Date of Service: [name], Anastasiya H. 04/02/2023 12:30 PM Medical Record Number: [id number] Patient Account Number: [id number] Date of Birth/Sex: Treating RN: [birth year] (77 y.o. F) Primary Care Provider: Ar[facility] N Other Clinician: Referring Provider: Treating Provider/Extender: Jacklynn Ganong, [place] [facility] N Weeks in Treatment: [address] Jessika H ([id number]) [id number] Page 8 of 10 Information Obtained From Patient Constitutional Symptoms (General Health) Medical History: Past Medical History Notes: obesity Eyes Medical History: Positive for: Cataracts - mild Negative for: Glaucoma; Optic Neuritis Ear/Nose/Mouth/Throat Medical History: Negative for: Chronic sinus problems/congestion; Middle ear problems Past Medical History Notes: seasonal  allergies Hematologic/Lymphatic Medical History: Positive for: Anemia - iron deficiency; Lymphedema Negative for: Hemophilia; Human Immunodeficiency Virus; Sickle Cell Disease Respiratory Medical History: Negative for:  Aspiration; Asthma; Chronic Obstructive Pulmonary Disease (COPD); Pneumothorax; Sleep Apnea; Tuberculosis Cardiovascular Medical History: Positive for: Congestive Heart Failure; Hypertension; Peripheral Venous Disease Negative for: Angina; Arrhythmia; Coronary Artery Disease; Deep Vein Thrombosis; Hypotension; Myocardial Infarction; Peripheral Arterial Disease; Phlebitis; Vasculitis Past Medical History Notes: aortic murmur, hyperlipidemia, hypertriglyceridemia Gastrointestinal Medical History: Negative for: Cirrhosis ; Colitis; Crohns; Hepatitis A; Hepatitis B; Hepatitis C Past Medical History Notes: UGI bleed, Endocrine Medical History: Negative for: Type I Diabetes; Type II Diabetes Past Medical History Notes: hypothyroidism Genitourinary Medical History: Negative for: End Stage Renal Disease Immunological Medical History: Negative for: Lupus Erythematosus; Raynauds; Scleroderma Integumentary (Skin) Medical History: Negative for: History of Burn Musculoskeletal Medical History: Positive for: Osteoarthritis Negative for: Gout; Rheumatoid Arthritis; Osteomyelitis Neurologic Medical History: Positive for: Neuropathy Simora Monteon, Irisa H ([id number]) [id number] Page 9 of 10 Negative for: Dementia; Quadriplegia; Paraplegia; Seizure Disorder Oncologic Medical History: Negative for: Received Chemotherapy; Received Radiation Psychiatric Medical History: Positive for: Confinement Anxiety Negative for: Anorexia/bulimia HBO Extended History Items Eyes: Cataracts Immunizations Pneumococcal Vaccine: Received Pneumococcal Vaccination: No Implantable Devices No devices added Hospitalization / Surgery History Type of  Hospitalization/Surgery right arm surgery gastric bypass hysterectomy tonsillectomy hernia repair cervical myelopathy with spinal cord compression Family and Social History Cancer: Yes - Mother,Father; Diabetes: Yes - Father; Heart Disease: Yes - Father; Hereditary Spherocytosis: No; Hypertension: Yes - Father; Kidney Disease: No; Lung Disease: No; Seizures: No; Stroke: Yes - Mother; Thyroid Problems: No; Tuberculosis: No; Never smoker; Marital Status - Married; Alcohol Use: Daily - 1.5 bottles of wine daily; Drug Use: No History; Caffeine Use: Daily - coffee; Financial Concerns: No; Food, Clothing or Shelter Needs: No; Support System Lacking: No; Transportation Concerns: No Electronic Signature(s) Signed: 04/02/2023 1:17:35 PM By: Duanne Guess MD FACS Entered By: Duanne Guess on 04/02/2023 13:06:01 -------------------------------------------------------------------------------- SuperBill Details Patient Name: Date of Service: [name], Shante H. 04/02/2023 Medical Record Number: [id number] Patient Account Number: [id number] Date of Birth/Sex: Treating RN: 1946-05-05 (76 y.o. F) Primary Care Provider: Jerel Shepherd, [place] [facility] N Other Clinician: Referring Provider: Treating Provider/Extender: Jacklynn Ganong, [place] RGA N Weeks in Treatment: 25 Diagnosis Coding ICD-10 Codes Code Description L97.312 Non-pressure chronic ulcer of right ankle with fat layer exposed I87.2 Venous insufficiency (chronic) (peripheral) I50.32 Chronic diastolic (congestive) heart failure E66.09 Other obesity due to excess calories Physician Procedures : CPT4 Code Description Modifier [id number] - WC PHYS LEVEL 4 - EST PT ICD-10 Diagnosis Description L97.312 Non-pressure chronic ulcer of right ankle with fat layer exposed I87.2 Venous insufficiency (chronic) (peripheral) I50.32 Chronic diastolic  (congestive) heart failure Lapre, Chrystel H ([id number]) [id number] P E66.09  Other obesity due to excess calories Quantity: 1 age 5 of 38 Electronic Signature(s) Signed: 04/02/2023 1:07:19 PM By: Duanne Guess MD FACS Entered By: Duanne Guess on 04/02/2023 13:07:19

## 2023-04-03 NOTE — Progress Notes (Signed)
[name] ([id number]) [id number] Page 1 of 7 Visit Report for 04/02/2023 Arrival Information Details Patient Name: Date of Service: [name], [name] 04/02/2023 12:30 PM Medical Record Number: [id number] Patient Account Number: [id number] Date of Birth/Sex: Treating RN: 30-Nov-1945 (77 y.o. [name], [name] Primary Care Marine Lezotte: [name], [place] [facility] N Other Clinician: Referring [name]: Treating [name]/Extender: [name], [place] [facility] N Weeks in Treatment: 25 Visit Information History Since Last Visit Added or deleted any medications: No Patient Arrived: Cane Any new allergies or adverse reactions: No Arrival Time: 12:44 Had a fall or experienced change in No Accompanied By: spouse activities of daily living that may affect Transfer Assistance: None risk of falls: Patient Identification Verified: Yes Signs or symptoms of abuse/neglect since last visito No Secondary Verification Process Completed: Yes Hospitalized since last visit: No Patient Requires Transmission-Based Precautions: No Implantable device outside of the clinic excluding No Patient Has Alerts: No cellular tissue based products placed in the center since last visit: Has Dressing in Place as Prescribed: Yes Has Compression in Place as Prescribed: Yes Pain Present Now: No Electronic Signature(s) Signed: 04/02/2023 5:06:07 PM By: Zenaida Deed RN, BSN Entered By: Zenaida Deed on 04/02/2023 12:46:35 -------------------------------------------------------------------------------- Compression Therapy Details Patient Name: Date of Service: [name], [name]. 04/02/2023 12:30 PM Medical Record Number: [id number] Patient Account Number: [id number] Date of Birth/Sex: Treating RN: May 17, 1946 (77 y.o. [name] Primary Care Kynleigh Artz: [name], [place] [facility] N Other Clinician: Referring Tanasha Menees: Treating Tola Meas/Extender: [name], [place] [facility] N Weeks in  Treatment: 25 Compression Therapy Performed for Wound Assessment: Wound #6 Right,Lateral Ankle Performed By: Clinician Zenaida Deed, RN Compression Type: Double Layer Post Procedure Diagnosis Same as Pre-procedure Notes urgo lite Electronic Signature(s) Signed: 04/02/2023 5:06:07 PM By: Zenaida Deed RN, BSN Entered By: Zenaida Deed on 04/02/2023 13:00:47 -------------------------------------------------------------------------------- Encounter Discharge Information Details Patient Name: Date of Service: [name], [name]. 04/02/2023 12:30 PM Medical Record Number: [id number] Patient Account Number: [id number] Date of Birth/Sex: Treating RN: 08-05-1946 (77 y.o. [name] Primary Care Dennys Guin: [name], [place] [facility] N Other Clinician: Referring Quashawn Jewkes: Treating Zabria Liss/Extender: [name], [place] [facility] N Weeks in Treatment: 25 Encounter Discharge Information Items Discharge Condition: [place], [name] ([id number]) [id number] Page 2 of 7 Ambulatory Status: Cane Discharge Destination: Home Transportation: Private Auto Accompanied By: spouse Schedule Follow-up Appointment: Yes Clinical Summary of Care: Patient Declined Electronic Signature(s) Signed: 04/02/2023 5:06:07 PM By: Zenaida Deed RN, BSN Entered By: Zenaida Deed on 04/02/2023 16:17:27 -------------------------------------------------------------------------------- Lower Extremity Assessment Details Patient Name: Date of Service: [facility], [name]. 04/02/2023 12:30 PM Medical Record Number: [id number] Patient Account Number: [id number] Date of Birth/Sex: Treating RN: 1946/04/13 (77 y.o. [name] Primary Care Serenna Deroy: [name], [place] [facility] N Other Clinician: Referring Bhavya Grand: Treating Santiana Glidden/Extender: [name], [place] [facility] N Weeks in Treatment: 25 Edema Assessment Assessed: [Left: No] [Right: No] Edema: [Left: Ye] [Right:  s] Calf Left: Right: Point of Measurement: From Medial Instep 31.5 cm Ankle Left: Right: Point of Measurement: From Medial Instep 20 cm Vascular Assessment Pulses: Dorsalis Pedis Palpable: [Right:Yes] Electronic Signature(s) Signed: 04/02/2023 5:06:07 PM By: Zenaida Deed RN, BSN Entered By: Zenaida Deed on 04/02/2023 12:49:41 -------------------------------------------------------------------------------- Multi Wound Chart Details Patient Name: Date of Service: [name], [name]. 04/02/2023 12:30 PM Medical Record Number: [id number] Patient Account Number: [id number] Date of Birth/Sex: Treating RN: 20-Mar-1946 (77 y.o. F) Primary Care Analy Bassford: [name], [place] [facility] N Other Clinician: Referring Aliea Bobe:  Treating [name]/Extender: [name], [place] [facility] N Weeks in Treatment: 25 Vital Signs Height(in): 65 Pulse(bpm): 72 Weight(lbs): 229 Blood Pressure(mmHg): 176/71 Body Mass Index(BMI): 38.1 Temperature(F): 97.8 Respiratory Rate(breaths/min): 18 [6:Photos:] [N/A:N/A] Right, Lateral Ankle N/A N/A Wound Location: Gradually Appeared N/A N/A Wounding Event: Venous Leg Ulcer N/A N/A Primary Etiology: Cataracts, Anemia, Lymphedema, N/A N/A Comorbid History: Congestive Heart Failure, Hypertension, Peripheral Venous Disease, Osteoarthritis, Neuropathy, Confinement Anxiety 09/25/2022 N/A N/A Date Acquired: 25 N/A N/A Weeks of Treatment: Open N/A N/A Wound Status: No N/A N/A Wound Recurrence: 0.6x0.5x0.1 N/A N/A Measurements L x W x D (cm) 0.236 N/A N/A A (cm) : rea 0.024 N/A N/A Volume (cm) : 77.20% N/A N/A % Reduction in Area: 76.90% N/A N/A % Reduction in Volume: Full Thickness Without Exposed N/A N/A Classification: Support Structures Medium N/A N/A Exudate Amount: Serous N/A N/A Exudate Type: amber N/A N/A Exudate Color: Flat and Intact N/A N/A Wound Margin: Large (67-100%) N/A N/A Granulation Amount: Red, Pink, Hyper-granulation  N/A N/A Granulation Quality: Small (1-33%) N/A N/A Necrotic Amount: Fat Layer (Subcutaneous Tissue): Yes N/A N/A Exposed Structures: Fascia: No Tendon: No Muscle: No Joint: No Bone: No Medium (34-66%) N/A N/A Epithelialization: No Abnormalities Noted N/A N/A Periwound Skin Texture: Maceration: No N/A N/A Periwound Skin Moisture: Dry/Scaly: No Hemosiderin Staining: Yes N/A N/A Periwound Skin Color: Erythema: No No Abnormality N/A N/A Temperature: Compression Therapy N/A N/A Procedures Performed: Treatment Notes Electronic Signature(s) Signed: 04/02/2023 1:05:09 PM By: Duanne Guess MD FACS Entered By: Duanne Guess on 04/02/2023 13:05:08 -------------------------------------------------------------------------------- Multi-Disciplinary Care Plan Details Patient Name: Date of Service: [name], [name]. 04/02/2023 12:30 PM Medical Record Number: [id number] Patient Account Number: [id number] Date of Birth/Sex: Treating RN: Apr 23, 1946 (77 y.o. [name] Primary Care Lenox Ladouceur: [name], [place] [facility] N Other Clinician: Referring Almer Littleton: Treating [name]/Extender: [name], [place] [facility] N Weeks in Treatment: 25 Multidisciplinary Care Plan reviewed with physician Active Inactive Nutrition Nursing Diagnoses: Potential for alteratiion in Nutrition/Potential for imbalanced nutrition Goals: Patient/caregiver agrees to and verbalizes understanding of need to use nutritional supplements and/or vitamins as prescribed Date Initiated: 10/13/2022 Target Resolution Date: 04/27/2023 Goal Status: Active [name], [name] ([id number]) [id number].pdf Page 4 of 7 Interventions: Provide education on nutrition Treatment Activities: Dietary management education, guidance and counseling : 10/13/2022 Education provided on Nutrition : 11/10/2022 Notes: Venous Leg Ulcer Nursing Diagnoses: Knowledge deficit related to disease process and  management Potential for venous Insuffiency (use before diagnosis confirmed) Goals: Patient will maintain optimal edema control Date Initiated: 03/02/2023 Target Resolution Date: 04/27/2023 Goal Status: Active Interventions: Assess peripheral edema status every visit. Compression as ordered Provide education on venous insufficiency Treatment Activities: Therapeutic compression applied : 03/02/2023 Notes: Electronic Signature(s) Signed: 04/02/2023 5:06:07 PM By: Zenaida Deed RN, BSN Entered By: Zenaida Deed on 04/02/2023 12:57:33 -------------------------------------------------------------------------------- Pain Assessment Details Patient Name: Date of Service: [name], [name]. 04/02/2023 12:30 PM Medical Record Number: [id number] Patient Account Number: [id number] Date of Birth/Sex: Treating RN: 1946/03/16 (77 y.o. [name] Primary Care Maximo Spratling: [name], [place] [facility] N Other Clinician: Referring Cassandra Harbold: Treating Laneka Mcgrory/Extender: [name], [place] RGA N Weeks in Treatment: 25 Active Problems Location of Pain Severity and Description of Pain Patient Has Paino No Site Locations Rate the pain. Current Pain Level: 0 Pain Management and Medication Current Pain Management: Electronic Signature(s) Signed: 04/02/2023 5:06:07 PM By: Zenaida Deed RN, BSN [place], [name] [phone]: Zenaida Deed RN, BSN [phone].pdf Page 5 of 7 Signed: 04/02/2023 5:06:07 PM  Entered By: Zenaida Deed on 04/02/2023 12:47:02 -------------------------------------------------------------------------------- Patient/Caregiver Education Details Patient Name: Date of Service: [name], [name] 5/6/2024andnbsp12:30 PM Medical Record Number: [id number] Patient Account Number: [id number] Date of Birth/Gender: Treating RN: [birth year] (77 y.o. [name] Primary Care Physician: [name], [place] [facility] N Other Clinician: Referring  Physician: Treating Physician/Extender: [name], [place] [facility] N Weeks in Treatment: 25 Education Assessment Education Provided To: Patient Education Topics Provided Venous: Methods: Explain/Verbal Responses: Reinforcements needed, State content correctly Wound/Skin Impairment: Methods: Explain/Verbal Responses: Reinforcements needed, State content correctly Electronic Signature(s) Signed: 04/02/2023 5:06:07 PM By: Zenaida Deed RN, BSN Entered By: Zenaida Deed on 04/02/2023 12:57:57 -------------------------------------------------------------------------------- Wound Assessment Details Patient Name: Date of Service: [name], [name]. 04/02/2023 12:30 PM Medical Record Number: [id number] Patient Account Number: [id number] Date of Birth/Sex: Treating RN: 1946/05/30 (76 y.o. [name] Primary Care Chassity Ludke: [name], [place] [facility] N Other Clinician: Referring Arabella Revelle: Treating Kayleen Alig/Extender: [name], [place] RGA N Weeks in Treatment: 25 Wound Status Wound Number: 6 Primary Venous Leg Ulcer Etiology: Wound Location: Right, Lateral Ankle Wound Open Wounding Event: Gradually Appeared Status: Date Acquired: 09/25/2022 Comorbid Cataracts, Anemia, Lymphedema, Congestive Heart Failure, Weeks Of Treatment: 25 History: Hypertension, Peripheral Venous Disease, Osteoarthritis, Clustered Wound: No Neuropathy, Confinement Anxiety Photos Wound Measurements Length: (cm) 0.6 Width: (cm) 0.5 Depth: (cm) 0.1 Tufo, Malena [name] ([id number]) Area: (cm) 0.236 Volume: (cm) 0.024 % Reduction in Area: 77.2% % Reduction in Volume: 76.9% Epithelialization: Medium (34-66%) [id number] Page 6 of 7 Tunneling: No Undermining: No Wound Description Classification: Full Thickness Without Exposed Support Wound Margin: Flat and Intact Exudate Amount: Medium Exudate Type: Serous Exudate Color: amber Structures Foul Odor After  Cleansing: No Slough/Fibrino Yes Wound Bed Granulation Amount: Large (67-100%) Exposed Structure Granulation Quality: Red, Pink, Hyper-granulation Fascia Exposed: No Necrotic Amount: Small (1-33%) Fat Layer (Subcutaneous Tissue) Exposed: Yes Necrotic Quality: Adherent Slough Tendon Exposed: No Muscle Exposed: No Joint Exposed: No Bone Exposed: No Periwound Skin Texture Texture Color No Abnormalities Noted: Yes No Abnormalities Noted: No Erythema: No Moisture Hemosiderin Staining: Yes No Abnormalities Noted: No Dry / Scaly: No Temperature / Pain Maceration: No Temperature: No Abnormality Treatment Notes Wound #6 (Ankle) Wound Laterality: Right, Lateral Cleanser Soap and Water Discharge Instruction: May shower and wash wound with dial antibacterial soap and water prior to dressing change. Peri-Wound Care Sween Lotion (Moisturizing lotion) Discharge Instruction: Apply moisturizing lotion as directed Topical Mupirocin Ointment Discharge Instruction: Apply Mupirocin (Bactroban) as instructed Primary Dressing Promogran Prisma Matrix, 4.34 (sq in) (silver collagen) Discharge Instruction: Moisten collagen with saline or hydrogel Secondary Dressing Woven Gauze Sponge, Non-Sterile 4x4 in Discharge Instruction: Apply over primary dressing as directed. Secured With Transpore Surgical Tape, 2x10 (in/yd) Discharge Instruction: Secure dressing with tape as directed. Compression Wrap Urgo K2, two layer compression system, regular Discharge Instruction: Apply Urgo K2 as directed (alternative to 4 layer compression). Compression Stockings Add-Ons Electronic Signature(s) Signed: 04/02/2023 5:06:07 PM By: Zenaida Deed RN, BSN Entered By: Zenaida Deed on 04/02/2023 12:54:39 -------------------------------------------------------------------------------- Vitals Details Patient Name: Date of Service: [name], [name]. 04/02/2023 12:30 PM [name] ([id number])  [id number] Page 7 of 7 Medical Record Number: [id number] Patient Account Number: [id number] Date of Birth/Sex: Treating RN: 1946/08/30 (77 y.o. [name] Primary Care Matha Masse: [name], [place] [facility] N Other Clinician: Referring Mareena Cavan: Treating Lynise Porr/Extender: [name], [place] RGA N Weeks in Treatment: 25 Vital Signs Time Taken: 12:46 Temperature (F): 97.8 Height (in): 65 Pulse (bpm): 72  Weight (lbs): 229 Respiratory Rate (breaths/min): 18 Body Mass Index (BMI): 38.1 Blood Pressure (mmHg): 176/71 Reference Range: 80 - 120 mg / dl Electronic Signature(s) Signed: 04/02/2023 5:06:07 PM By: Zenaida Deed RN, BSN Entered By: Zenaida Deed on 04/02/2023 12:46:54

## 2023-04-04 DIAGNOSIS — H353131 Nonexudative age-related macular degeneration, bilateral, early dry stage: Secondary | ICD-10-CM | POA: Diagnosis not present

## 2023-04-04 DIAGNOSIS — H02831 Dermatochalasis of right upper eyelid: Secondary | ICD-10-CM | POA: Diagnosis not present

## 2023-04-04 DIAGNOSIS — H35033 Hypertensive retinopathy, bilateral: Secondary | ICD-10-CM | POA: Diagnosis not present

## 2023-04-04 DIAGNOSIS — H25813 Combined forms of age-related cataract, bilateral: Secondary | ICD-10-CM | POA: Diagnosis not present

## 2023-04-10 ENCOUNTER — Encounter (HOSPITAL_BASED_OUTPATIENT_CLINIC_OR_DEPARTMENT_OTHER): Payer: PPO | Admitting: General Surgery

## 2023-04-10 DIAGNOSIS — I872 Venous insufficiency (chronic) (peripheral): Secondary | ICD-10-CM | POA: Diagnosis not present

## 2023-04-10 DIAGNOSIS — L97312 Non-pressure chronic ulcer of right ankle with fat layer exposed: Secondary | ICD-10-CM | POA: Diagnosis not present

## 2023-04-19 ENCOUNTER — Encounter (HOSPITAL_BASED_OUTPATIENT_CLINIC_OR_DEPARTMENT_OTHER): Payer: PPO | Admitting: General Surgery

## 2023-04-19 DIAGNOSIS — L97312 Non-pressure chronic ulcer of right ankle with fat layer exposed: Secondary | ICD-10-CM | POA: Diagnosis not present

## 2023-04-19 DIAGNOSIS — I872 Venous insufficiency (chronic) (peripheral): Secondary | ICD-10-CM | POA: Diagnosis not present

## 2023-04-27 ENCOUNTER — Encounter (HOSPITAL_BASED_OUTPATIENT_CLINIC_OR_DEPARTMENT_OTHER): Payer: PPO | Admitting: General Surgery

## 2023-04-27 DIAGNOSIS — L97312 Non-pressure chronic ulcer of right ankle with fat layer exposed: Secondary | ICD-10-CM | POA: Diagnosis not present

## 2023-04-27 DIAGNOSIS — I872 Venous insufficiency (chronic) (peripheral): Secondary | ICD-10-CM | POA: Diagnosis not present

## 2023-04-30 ENCOUNTER — Other Ambulatory Visit: Payer: Self-pay | Admitting: Family Medicine

## 2023-04-30 DIAGNOSIS — G8929 Other chronic pain: Secondary | ICD-10-CM

## 2023-04-30 DIAGNOSIS — E782 Mixed hyperlipidemia: Secondary | ICD-10-CM

## 2023-05-04 ENCOUNTER — Encounter (HOSPITAL_BASED_OUTPATIENT_CLINIC_OR_DEPARTMENT_OTHER): Payer: PPO | Attending: General Surgery | Admitting: Internal Medicine

## 2023-05-04 DIAGNOSIS — L97312 Non-pressure chronic ulcer of right ankle with fat layer exposed: Secondary | ICD-10-CM | POA: Diagnosis not present

## 2023-05-04 DIAGNOSIS — L97821 Non-pressure chronic ulcer of other part of left lower leg limited to breakdown of skin: Secondary | ICD-10-CM | POA: Diagnosis not present

## 2023-05-04 DIAGNOSIS — M1990 Unspecified osteoarthritis, unspecified site: Secondary | ICD-10-CM | POA: Diagnosis not present

## 2023-05-04 DIAGNOSIS — Z9884 Bariatric surgery status: Secondary | ICD-10-CM | POA: Insufficient documentation

## 2023-05-04 DIAGNOSIS — Z86718 Personal history of other venous thrombosis and embolism: Secondary | ICD-10-CM | POA: Diagnosis not present

## 2023-05-04 DIAGNOSIS — I110 Hypertensive heart disease with heart failure: Secondary | ICD-10-CM | POA: Insufficient documentation

## 2023-05-04 DIAGNOSIS — I872 Venous insufficiency (chronic) (peripheral): Secondary | ICD-10-CM | POA: Diagnosis not present

## 2023-05-04 DIAGNOSIS — I5032 Chronic diastolic (congestive) heart failure: Secondary | ICD-10-CM | POA: Insufficient documentation

## 2023-05-04 DIAGNOSIS — E039 Hypothyroidism, unspecified: Secondary | ICD-10-CM | POA: Insufficient documentation

## 2023-05-04 DIAGNOSIS — E785 Hyperlipidemia, unspecified: Secondary | ICD-10-CM | POA: Diagnosis not present

## 2023-05-11 ENCOUNTER — Encounter (HOSPITAL_BASED_OUTPATIENT_CLINIC_OR_DEPARTMENT_OTHER): Payer: PPO | Admitting: General Surgery

## 2023-05-11 DIAGNOSIS — I10 Essential (primary) hypertension: Secondary | ICD-10-CM | POA: Diagnosis not present

## 2023-05-11 DIAGNOSIS — I872 Venous insufficiency (chronic) (peripheral): Secondary | ICD-10-CM | POA: Diagnosis not present

## 2023-05-11 DIAGNOSIS — L97312 Non-pressure chronic ulcer of right ankle with fat layer exposed: Secondary | ICD-10-CM | POA: Diagnosis not present

## 2023-05-13 NOTE — Progress Notes (Signed)
[name], [name] ([id number]) [id number] Page 1 of 10 Visit Report for 05/11/2023 Chief Complaint Document Details Patient Name: Date of Service: [name], [name] 05/11/2023 1:45 PM Medical Record Number: [id number] Patient Account Number: [id number] Date of Birth/Sex: Treating RN: Apr 20, 1946 (77 y.o. F) Primary Care Provider: Saralyn Pilar Other Clinician: Referring Provider: Treating Provider/Extender: Park Liter in Treatment: 31 Information Obtained from: Patient Chief Complaint Bilateral LE Ulcers 10/05/2022: RLE ulcer Electronic Signature(s) Signed: 05/11/2023 2:39:07 PM By: Duanne Guess MD FACS Entered By: Duanne Guess on 05/11/2023 14:39:07 -------------------------------------------------------------------------------- HPI Details Patient Name: Date of Service: [name], [name]. 05/11/2023 1:45 PM Medical Record Number: [id number] Patient Account Number: [id number] Date of Birth/Sex: Treating RN: 08/28/1946 (77 y.o. F) Primary Care Provider: Saralyn Pilar Other Clinician: Referring Provider: Treating Provider/Extender: Park Liter in Treatment: 31 History of Present Illness HPI Description: 07/09/18 on evaluation today patient presents for bilateral lower extremity ulcers. She has a past medical history significant for chronic venous stasis, hypertension, and lower extremity edema with associated skin changes. Unfortunately she tells me that roughly 3 weeks ago she noted ulcers on the left lower extremity which started given her trouble. Subsequently she ended up proceeding with applying mupirocin ointment and was keeping this open air for the most part. She states that it would scab over somewhat and then reopened. The wounds have been training quite significantly. Fortunately there does not appear to be any evidence of overt infection there is some erythematous type skin  changes but I believe this is likely stasis dermatitis nothing more. Fortunately she has no evidence of any systemic infection. No fevers, chills, nausea, or vomiting noted at this time. She has been recommended before to wear compression stockings but she does not wear them on a regular basis. She has never been suggested to get Juxta-Lite compression wraps she was not aware of what these were. Patient had venous studies performed on 11/09/17 which showed no lower surety DVT or deep vein insufficiency noted. She did not have evaluation however of the superficial veins. There was full compressibility noted throughout. Her arterial study which was performed on 11/18/17 actually showed that she has an ABI on the right of 1.17 with a TBI of 1.59 along with an ABI of 1.12 on the left and a TBI of 0.99. 07/17/18 on evaluation today patient appears to be doing much better in regard to her left lower Trinity ulcers. She's been tolerating the dressing changes without complication. There does not appear to be any evidence of infection which is good news. Overall I'm very pleased with the progress at this time. 07/24/18 on evaluation today patient's wound actually appears to be completely healed at this time. Fortunately there does not appear to be any evidence of infection which is good news. Overall I'm very happy with the progress that has been made. She did get her Velcro compression wraps. READMISSION 12/31/2020 This is a patient we had in this clinic in 2019 felt to have chronic venous insufficiency wounds on her lower extremities. She was discharged with bilateral compression stockings. She tells [place] that she never really wore them or at best wore them sparingly. She is back in clinic today having a ulcer on her left lateral lower extremity since October and one on the right anterior for the last 2 weeks. I do not know that she is doing any primary dressing on these certainly not wearing any form of  compression. She is already been evaluated  by vein and vascular on 11/15/2020. They noted that she did have ulcers on the left leg in October that healed with a need Unna boot. She had a reflux study on 12/20 that did not show evidence of a DVT or venous reflux bilaterally. They was not felt that she had arterial insufficiency based on the fact that she had brisk biphasic Doppler signals in the bilateral DP PT. [name], [name] ([id number]) [id number] Page 2 of 10 The patient comes in today with superficial wounds on the right anterior mid tibia and the left lateral calf. These are small appear to have healthy surfaces. Past medical history includes cervical and upper thoracic spine surgery on 7/21, hernia repair early in January, hypertension, hyperlipidemia, hypothyroidism, gastric bypass surgery, DVT in the right peroneal artery and left popliteal and peroneal veins. Next problem CHF. She does not have diabetes ABIs in our clinic were 1.1 on the right and 1.2 on the lower 2/11; the patient's wound on the left lateral and right anterior lower leg are closed for edema control is good. She has an assortment of old juxta lite stockings with old liners. She does not like to wear anything in particular under the compression part of the juxta lite stocking. I think she is going to do exactly what she wants. READMISSION 10/05/2022 This is a 77 year old woman last seen in our clinic about 18 months ago. She has a history of venous stasis and stasis dermatitis. A reflux study done in 2021 did not show any evidence of venous reflux. She says about 3 weeks ago, she noticed an ulcer on her right lateral malleolus. She was applying triple antibiotic ointment to it for a while and then was soaking it in Epsom salts. She reports that she usually wears compression stockings, but that they were rubbing so she did not wear 1 on her right leg since the wound appeared, she has a stocking  with the toe and heel cut out applied to her left leg. On examination, she has changes of chronic stasis dermatitis on the right. There is a shallow ulcer overlying the lateral malleolus. The fat layer is exposed. It is a little bit desiccated but fairly clean with just some light eschar and slough present. No malodor or purulent drainage. 10/13/2022: The wound is a little smaller today. Although the moisture balance is better than last week, it is still a bit dry. There is still some slough accumulation on the surface. 10/25/2022: The wound continues to contract. There is still thick layer of slough on the surface. The moisture balance of the wound is better, but the periwound skin is slightly macerated. 11/02/2022: The wound is smaller and shallower today. There is still slough on the surface, but the periwound skin is intact. The wound surface is still drier than ideal. 12/15; wound is about the same size minimal depth. Under illumination some debris on the surface she has been using Prisma 11/16/2022: The leg wrap became saturated when her makeshift cast protector leaked. The wound is a little bit larger today, as result of moisture-related tissue breakdown. The surface remains fairly fibrotic. 11/30/2022: The wound measured a little bit larger today secondary to additional moisture related periwound breakdown. There is slough on the wound surface. Still fairly fibrotic underneath. 12/05/2022: The ankle wound is a little smaller but still has slough accumulation. She has been approved for Apligraf and we are going to apply that today. 12/15/2022: The wound is smaller again today. Minimal slough. Edema control is  good. 12/29/2022: The wound continues to contract. The surface is a little bit less fibrotic today. Light slough accumulation. 01/12/2023: The wound is smaller and has a better fill of granulation tissue. There is just a little slough on the surface and a small remaining area in the center that  is still fibrotic. 01/26/2023: The wound measured larger for some reason today, but on visual inspection it appears about the same size. There is more granulation tissue and just a very small area in the center that remains fibrotic. Minimal slough. 02/15/2023: The wound on her right lateral ankle is a little bit smaller and shallower today. The surface remains tough but less fibrotic than on initial intake. She has a wound on her left anterior tibia that she says has been present for about a month. She has been trying to manage it on her own at home but as it has not improved, she wanted [place] to take over management of this site. It is fairly superficial, essentially a deep scrape. There is some slough on the surface. No concern for infection. 02/23/2023: The wound on her left anterior tibial surface is nearly closed underneath the layer of eschar. The right lateral ankle wound is about the same size but a little bit shallower today. She says it has been more tender over the week and indeed, there is some erythema and swelling present. 03/02/2023: The wound on her left anterior tibia is closed. The right lateral ankle wound is shallower with a more healthy-looking surface. 03/09/2023: The right lateral ankle wound has contracted and the surface is more viable-looking. There is some slough and eschar buildup. Edema control is excellent. 04/02/2023: The right lateral ankle wound is about half the size that it was at her previous visit. There is good granulation tissue coming through in buds. No slough or eschar present. 04/10/2023: The wound continues to contract. Minimal slough on the surface. Good edema control. 04/19/2023: The wound is down to just a couple of millimeters. Minimal slough on the surface with a little bit of eschar around the perimeter. 04/27/2023: The wound is even smaller today. There is just a little eschar on the surface. Edema control is good. 6/7 predominantly venous wound on the right  lateral ankle. The patient had eschar on the surface. Edema control was good. She was hoping this was healed 05/11/2023: Her wound is healed. Electronic Signature(s) Signed: 05/11/2023 2:39:29 PM By: Duanne Guess MD FACS Entered By: Duanne Guess on 05/11/2023 14:39:29 [name] ([id number]) [id number] Page 3 of 10 -------------------------------------------------------------------------------- Physical Exam Details Patient Name: Date of Service: [name], [name] 05/11/2023 1:45 PM Medical Record Number: [id number] Patient Account Number: [id number] Date of Birth/Sex: Treating RN: [birth year] (77 y.o. F) Primary Care Provider: Saralyn Pilar Other Clinician: Referring Provider: Treating Provider/Extender: Park Liter in Treatment: 31 Constitutional Hypertensive, asymptomatic. . . . no acute distress. Respiratory Normal work of breathing on room air. Notes 05/11/2023: Her wound is healed. Electronic Signature(s) Signed: 05/11/2023 2:42:45 PM By: Duanne Guess MD FACS Entered By: Duanne Guess on 05/11/2023 14:42:45 -------------------------------------------------------------------------------- Physician Orders Details Patient Name: Date of Service: [name], Stark Bray [name]. 05/11/2023 1:45 PM Medical Record Number: [id number] Patient Account Number: [id number] Date of Birth/Sex: Treating RN: [birth year] (77 y.o. Gevena Mart Primary Care Provider: Saralyn Pilar Other Clinician: Referring Provider: Treating Provider/Extender: Park Liter in Treatment: 37 Verbal / Phone Orders: No Diagnosis Coding ICD-10 Coding Code Description L97.312 Non-pressure chronic ulcer of right ankle with  fat layer exposed I87.2 Venous insufficiency (chronic) (peripheral) I50.32 Chronic diastolic (congestive) heart failure E66.09 Other obesity due to excess calories Discharge From [facility]charge from [facility] - Congratulations on healing! Call us if you need [place]. Bathing/ Shower/ Hygiene May shower and wash wound with soap and water. Edema Control - Lymphedema / SCD / Other Bilateral Lower Extremities Elevate legs to the level of the heart or above for 30 minutes daily and/or when sitting for 3-4 times a day throughout the day. Avoid standing for long periods of time. Patient to wear own compression stockings every day. Moisturize legs daily. Electronic Signature(s) Signed: 05/11/2023 2:42:56 PM By: Duanne Guess MD FACS Entered By: Duanne Guess on 05/11/2023 14:42:55 [name] ([id number]) [id number] Page 4 of 10 -------------------------------------------------------------------------------- Problem List Details Patient Name: Date of Service: [name], [name] 05/11/2023 1:45 PM Medical Record Number: [id number] Patient Account Number: [id number] Date of Birth/Sex: Treating RN: 27-Feb-1946 (77 y.o. Gevena Mart Primary Care Provider: Saralyn Pilar Other Clinician: Referring Provider: Treating Provider/Extender: Harrie Jeans Weeks in Treatment: 31 Active Problems ICD-10 Encounter Code Description Active Date MDM Diagnosis L97.312 Non-pressure chronic ulcer of right ankle with fat layer exposed 10/05/2022 No Yes I87.2 Venous insufficiency (chronic) (peripheral) 10/05/2022 No Yes I50.32 Chronic diastolic (congestive) heart failure 10/05/2022 No Yes E66.09 Other obesity due to excess calories 10/05/2022 No Yes Inactive Problems Resolved Problems ICD-10 Code Description Active Date Resolved Date L97.821 Non-pressure chronic ulcer of other part of left lower leg limited to breakdown of skin 02/15/2023 02/15/2023 Electronic Signature(s) Signed: 05/11/2023 2:15:18 PM By: Duanne Guess MD FACS Entered By: Duanne Guess on 05/11/2023  14:15:18 -------------------------------------------------------------------------------- Progress Note Details Patient Name: Date of Service: [name], [name]. 05/11/2023 1:45 PM Medical Record Number: [id number] Patient Account Number: [id number] Date of Birth/Sex: Treating RN: [birth year] (78 y.o. F) Primary Care Provider: Saralyn Pilar Other Clinician: Referring Provider: Treating Provider/Extender: Park Liter in Treatment: 31 Subjective Chief Complaint Information obtained from Patient Bilateral LE Ulcers 10/05/2022: RLE ulcer [name], [name] ([id number]) [phone] Page 5 of 10 History of Present Illness (HPI) 07/09/18 on evaluation today patient presents for bilateral lower extremity ulcers. She has a past medical history significant for chronic venous stasis, hypertension, and lower extremity edema with associated skin changes. Unfortunately she tells me that roughly 3 weeks ago she noted ulcers on the left lower extremity which started given her trouble. Subsequently she ended up proceeding with applying mupirocin ointment and was keeping this open air for the most part. She states that it would scab over somewhat and then reopened. The wounds have been training quite significantly. Fortunately there does not appear to be any evidence of overt infection there is some erythematous type skin changes but I believe this is likely stasis dermatitis nothing more. Fortunately she has no evidence of any systemic infection. No fevers, chills, nausea, or vomiting noted at this time. She has been recommended before to wear compression stockings but she does not wear them on a regular basis. She has never been suggested to get Juxta-Lite compression wraps she was not aware of what these were. Patient had venous studies performed on 11/09/17 which showed no lower surety DVT or deep vein insufficiency noted. She did not have evaluation  however of the superficial veins. There was full compressibility noted throughout. Her arterial study which was performed on 11/18/17 actually showed that she has an ABI on the right of 1.17 with a TBI of  1.59 along with an ABI of 1.12 on the left and a TBI of 0.99. 07/17/18 on evaluation today patient appears to be doing much better in regard to her left lower Trinity ulcers. She's been tolerating the dressing changes without complication. There does not appear to be any evidence of infection which is good news. Overall I'm very pleased with the progress at this time. 07/24/18 on evaluation today patient's wound actually appears to be completely healed at this time. Fortunately there does not appear to be any evidence of infection which is good news. Overall I'm very happy with the progress that has been made. She did get her Velcro compression wraps. READMISSION 12/31/2020 This is a patient we had in this clinic in 2019 felt to have chronic venous insufficiency wounds on her lower extremities. She was discharged with bilateral compression stockings. She tells [place] that she never really wore them or at best wore them sparingly. She is back in clinic today having a ulcer on her left lateral lower extremity since October and one on the right anterior for the last 2 weeks. I do not know that she is doing any primary dressing on these certainly not wearing any form of compression. She is already been evaluated by vein and vascular on 11/15/2020. They noted that she did have ulcers on the left leg in October that healed with a need Unna boot. She had a reflux study on 12/20 that did not show evidence of a DVT or venous reflux bilaterally. They was not felt that she had arterial insufficiency based on the fact that she had brisk biphasic Doppler signals in the bilateral DP PT. The patient comes in today with superficial wounds on the right anterior mid tibia and the left lateral calf. These are small appear to  have healthy surfaces. Past medical history includes cervical and upper thoracic spine surgery on 7/21, hernia repair early in January, hypertension, hyperlipidemia, hypothyroidism, gastric bypass surgery, DVT in the right peroneal artery and left popliteal and peroneal veins. Next problem CHF. She does not have diabetes ABIs in our clinic were 1.1 on the right and 1.2 on the lower 2/11; the patient's wound on the left lateral and right anterior lower leg are closed for edema control is good. She has an assortment of old juxta lite stockings with old liners. She does not like to wear anything in particular under the compression part of the juxta lite stocking. I think she is going to do exactly what she wants. READMISSION 10/05/2022 This is a 77 year old woman last seen in our clinic about 18 months ago. She has a history of venous stasis and stasis dermatitis. A reflux study done in 2021 did not show any evidence of venous reflux. She says about 3 weeks ago, she noticed an ulcer on her right lateral malleolus. She was applying triple antibiotic ointment to it for a while and then was soaking it in Epsom salts. She reports that she usually wears compression stockings, but that they were rubbing so she did not wear 1 on her right leg since the wound appeared, she has a stocking with the toe and heel cut out applied to her left leg. On examination, she has changes of chronic stasis dermatitis on the right. There is a shallow ulcer overlying the lateral malleolus. The fat layer is exposed. It is a little bit desiccated but fairly clean with just some light eschar and slough present. No malodor or purulent drainage. 10/13/2022: The wound is a little  smaller today. Although the moisture balance is better than last week, it is still a bit dry. There is still some slough accumulation on the surface. 10/25/2022: The wound continues to contract. There is still thick layer of slough on the surface. The  moisture balance of the wound is better, but the periwound skin is slightly macerated. 11/02/2022: The wound is smaller and shallower today. There is still slough on the surface, but the periwound skin is intact. The wound surface is still drier than ideal. 12/15; wound is about the same size minimal depth. Under illumination some debris on the surface she has been using Prisma 11/16/2022: The leg wrap became saturated when her makeshift cast protector leaked. The wound is a little bit larger today, as result of moisture-related tissue breakdown. The surface remains fairly fibrotic. 11/30/2022: The wound measured a little bit larger today secondary to additional moisture related periwound breakdown. There is slough on the wound surface. Still fairly fibrotic underneath. 12/05/2022: The ankle wound is a little smaller but still has slough accumulation. She has been approved for Apligraf and we are going to apply that today. 12/15/2022: The wound is smaller again today. Minimal slough. Edema control is good. 12/29/2022: The wound continues to contract. The surface is a little bit less fibrotic today. Light slough accumulation. 01/12/2023: The wound is smaller and has a better fill of granulation tissue. There is just a little slough on the surface and a small remaining area in the center that is still fibrotic. 01/26/2023: The wound measured larger for some reason today, but on visual inspection it appears about the same size. There is more granulation tissue and just a very small area in the center that remains fibrotic. Minimal slough. 02/15/2023: The wound on her right lateral ankle is a little bit smaller and shallower today. The surface remains tough but less fibrotic than on initial intake. She has a wound on her left anterior tibia that she says has been present for about a month. She has been trying to manage it on her own at home but as it has not improved, she wanted [place] to take over management of this  site. It is fairly superficial, essentially a deep scrape. There is some slough on the surface. No concern for infection. 02/23/2023: The wound on her left anterior tibial surface is nearly closed underneath the layer of eschar. The right lateral ankle wound is about the same size but a little bit shallower today. She says it has been more tender over the week and indeed, there is some erythema and swelling present. 03/02/2023: The wound on her left anterior tibia is closed. The right lateral ankle wound is shallower with a more healthy-looking surface. 03/09/2023: The right lateral ankle wound has contracted and the surface is more viable-looking. There is some slough and eschar buildup. Edema control is [name], [name] ([id number]) [id number] Page 6 of 10 excellent. 04/02/2023: The right lateral ankle wound is about half the size that it was at her previous visit. There is good granulation tissue coming through in buds. No slough or eschar present. 04/10/2023: The wound continues to contract. Minimal slough on the surface. Good edema control. 04/19/2023: The wound is down to just a couple of millimeters. Minimal slough on the surface with a little bit of eschar around the perimeter. 04/27/2023: The wound is even smaller today. There is just a little eschar on the surface. Edema control is good. 6/7 predominantly venous wound on the right lateral ankle. The patient  had eschar on the surface. Edema control was good. She was hoping this was healed 05/11/2023: Her wound is healed. Patient History Information obtained from Patient. Family History Cancer - Mother,Father, Diabetes - Father, Heart Disease - Father, Hypertension - Father, Stroke - Mother, No family history of Hereditary Spherocytosis, Kidney Disease, Lung Disease, Seizures, Thyroid Problems, Tuberculosis. Social History Never smoker, Marital Status - Married, Alcohol Use - Daily - 1.5 bottles of wine daily, Drug  Use - No History, Caffeine Use - Daily - coffee. Medical History Eyes Patient has history of Cataracts - mild Denies history of Glaucoma, Optic Neuritis Ear/Nose/Mouth/Throat Denies history of Chronic sinus problems/congestion, Middle ear problems Hematologic/Lymphatic Patient has history of Anemia - iron deficiency, Lymphedema Denies history of Hemophilia, Human Immunodeficiency Virus, Sickle Cell Disease Respiratory Denies history of Aspiration, Asthma, Chronic Obstructive Pulmonary Disease (COPD), Pneumothorax, Sleep Apnea, Tuberculosis Cardiovascular Patient has history of Congestive Heart Failure, Hypertension, Peripheral Venous Disease Denies history of Angina, Arrhythmia, Coronary Artery Disease, Deep Vein Thrombosis, Hypotension, Myocardial Infarction, Peripheral Arterial Disease, Phlebitis, Vasculitis Gastrointestinal Denies history of Cirrhosis , Colitis, Crohns, Hepatitis A, Hepatitis B, Hepatitis C Endocrine Denies history of Type I Diabetes, Type II Diabetes Genitourinary Denies history of End Stage Renal Disease Immunological Denies history of Lupus Erythematosus, Raynauds, Scleroderma Integumentary (Skin) Denies history of History of Burn Musculoskeletal Patient has history of Osteoarthritis Denies history of Gout, Rheumatoid Arthritis, Osteomyelitis Neurologic Patient has history of Neuropathy - bila Denies history of Dementia, Quadriplegia, Paraplegia, Seizure Disorder Oncologic Denies history of Received Chemotherapy, Received Radiation Psychiatric Patient has history of Confinement Anxiety Denies history of Anorexia/bulimia Hospitalization/Surgery History - right arm surgery. - gastric bypass. - hysterectomy. - tonsillectomy. - hernia repair. - cervical myelopathy with spinal cord compression. Medical A Surgical History Notes nd Constitutional Symptoms (General Health) obesity Ear/Nose/Mouth/Throat seasonal allergies Cardiovascular aortic murmur,  hyperlipidemia, hypertriglyceridemia Gastrointestinal UGI bleed, Endocrine hypothyroidism Objective Constitutional [name], [name] ([id number]) [phone].pdf Page 7 of 10 Hypertensive, asymptomatic. no acute distress. Vitals Time Taken: 1:50 PM, Height: 65 in, Weight: 229 lbs, BMI: 38.1, Temperature: 98.0 F, Pulse: 73 bpm, Respiratory Rate: 18 breaths/min, Blood Pressure: 163/79 mmHg. Respiratory Normal work of breathing on room air. General Notes: 05/11/2023: Her wound is healed. Integumentary (Hair, Skin) Wound #6 status is Open. Original cause of wound was Gradually Appeared. The date acquired was: 09/25/2022. The wound has been in treatment 31 weeks. The wound is located on the Right,Lateral Ankle. The wound measures 0cm length x 0cm width x 0cm depth; 0cm^2 area and 0cm^3 volume. There is Fat Layer (Subcutaneous Tissue) exposed. There is a small amount of serous drainage noted. The wound margin is flat and intact. There is large (67-100%) red, pink granulation within the wound bed. There is a small (1-33%) amount of necrotic tissue within the wound bed. The periwound skin appearance had no abnormalities noted for texture. The periwound skin appearance exhibited: Hemosiderin Staining. The periwound skin appearance did not exhibit: Dry/Scaly, Maceration, Erythema. Periwound temperature was noted as No Abnormality. Assessment Active Problems ICD-10 Non-pressure chronic ulcer of right ankle with fat layer exposed Venous insufficiency (chronic) (peripheral) Chronic diastolic (congestive) heart failure Other obesity due to excess calories Plan Discharge From [facility] Services: Discharge from Wound Care Center - Congratulations on healing! Call us if you need [place]. Bathing/ Shower/ Hygiene: May shower and wash wound with soap and water. Edema Control - Lymphedema / SCD / Other: Elevate legs to the level of the heart or above for 30 minutes daily and/or when  sitting  for 3-4 times a day throughout the day. Avoid standing for long periods of time. Patient to wear own compression stockings every day. Moisturize legs daily. 05/11/2023: Her wound is healed. I have asked her to keep the area covered and protected from trauma for the next couple of weeks as it is just freshly healed. She has her compression stockings with her and we applied it before she left. We will discharge her from the wound care center and she may follow- up as needed. Electronic Signature(s) Signed: 05/11/2023 2:43:45 PM By: Duanne Guess MD FACS Entered By: Duanne Guess on 05/11/2023 14:43:45 -------------------------------------------------------------------------------- HxROS Details Patient Name: Date of Service: [name], [name]. 05/11/2023 1:45 PM Medical Record Number: [id number] Patient Account Number: [id number] Date of Birth/Sex: Treating RN: 1946-03-27 (77 y.o. F) Primary Care Provider: Saralyn Pilar Other Clinician: Referring Provider: Treating Provider/Extender: Park Liter in Treatment: [address] Information Obtained From Patient [name], [name] ([id number]) [id number] Page 8 of 10 Constitutional Symptoms (General Health) Medical History: Past Medical History Notes: obesity Eyes Medical History: Positive for: Cataracts - mild Negative for: Glaucoma; Optic Neuritis Ear/Nose/Mouth/Throat Medical History: Negative for: Chronic sinus problems/congestion; Middle ear problems Past Medical History Notes: seasonal allergies Hematologic/Lymphatic Medical History: Positive for: Anemia - iron deficiency; Lymphedema Negative for: Hemophilia; Human Immunodeficiency Virus; Sickle Cell Disease Respiratory Medical History: Negative for: Aspiration; Asthma; Chronic Obstructive Pulmonary Disease (COPD); Pneumothorax; Sleep Apnea; Tuberculosis Cardiovascular Medical History: Positive for: Congestive Heart  Failure; Hypertension; Peripheral Venous Disease Negative for: Angina; Arrhythmia; Coronary Artery Disease; Deep Vein Thrombosis; Hypotension; Myocardial Infarction; Peripheral Arterial Disease; Phlebitis; Vasculitis Past Medical History Notes: aortic murmur, hyperlipidemia, hypertriglyceridemia Gastrointestinal Medical History: Negative for: Cirrhosis ; Colitis; Crohns; Hepatitis A; Hepatitis B; Hepatitis C Past Medical History Notes: UGI bleed, Endocrine Medical History: Negative for: Type I Diabetes; Type II Diabetes Past Medical History Notes: hypothyroidism Genitourinary Medical History: Negative for: End Stage Renal Disease Immunological Medical History: Negative for: Lupus Erythematosus; Raynauds; Scleroderma Integumentary (Skin) Medical History: Negative for: History of Burn Musculoskeletal Medical History: Positive for: Osteoarthritis Negative for: Gout; Rheumatoid Arthritis; Osteomyelitis Neurologic Medical History: Positive for: Neuropathy - bila Negative for: Dementia; Quadriplegia; Paraplegia; Seizure Disorder ALYSIANA, DEVILLIER ([id number]) [id number] Page 9 of 10 Oncologic Medical History: Negative for: Received Chemotherapy; Received Radiation Psychiatric Medical History: Positive for: Confinement Anxiety Negative for: Anorexia/bulimia HBO Extended History Items Eyes: Cataracts Immunizations Pneumococcal Vaccine: Received Pneumococcal Vaccination: No Implantable Devices No devices added Hospitalization / Surgery History Type of Hospitalization/Surgery right arm surgery gastric bypass hysterectomy tonsillectomy hernia repair cervical myelopathy with spinal cord compression Family and Social History Cancer: Yes - Mother,Father; Diabetes: Yes - Father; Heart Disease: Yes - Father; Hereditary Spherocytosis: No; Hypertension: Yes - Father; Kidney Disease: No; Lung Disease: No; Seizures: No; Stroke: Yes - Mother; Thyroid  Problems: No; Tuberculosis: No; Never smoker; Marital Status - Married; Alcohol Use: Daily - 1.5 bottles of wine daily; Drug Use: No History; Caffeine Use: Daily - coffee; Financial Concerns: No; Food, Clothing or Shelter Needs: No; Support System Lacking: No; Transportation Concerns: No Electronic Signature(s) Signed: 05/11/2023 3:34:04 PM By: Duanne Guess MD FACS Entered By: Duanne Guess on 05/11/2023 14:41:49 -------------------------------------------------------------------------------- SuperBill Details Patient Name: Date of Service: [name], Quinlee [name]. 05/11/2023 Medical Record Number: [id number] Patient Account Number: [id number] Date of Birth/Sex: Treating RN: [birth year] (77 y.o. Gevena Mart Primary Care Provider: Saralyn Pilar Other Clinician: Referring Provider: Treating Provider/Extender: Harrie Jeans Weeks in Treatment: 31 Diagnosis Coding ICD-10 Codes Code  Description L97.312 Non-pressure chronic ulcer of right ankle with fat layer exposed I87.2 Venous insufficiency (chronic) (peripheral) I50.32 Chronic diastolic (congestive) heart failure E66.09 Other obesity due to excess calories Facility Procedures Physician Procedures : CPT4 Code Description Modifier [id number] - WC PHYS LEVEL 3 - EST PT ICD-10 Diagnosis Description I87.2 Venous insufficiency (chronic) (peripheral) I50.32 Chronic diastolic (congestive) heart failure L97.312 Non-pressure chronic ulcer of right ankle  with fat layer exposed E66.09 Other obesity due to excess calories Quantity: 1 Electronic Signature(s) Signed: 05/11/2023 2:44:20 PM By: Duanne Guess MD FACS Entered By: Duanne Guess on 05/11/2023 14:44:19

## 2023-05-14 DIAGNOSIS — H25811 Combined forms of age-related cataract, right eye: Secondary | ICD-10-CM | POA: Diagnosis not present

## 2023-05-14 DIAGNOSIS — H25813 Combined forms of age-related cataract, bilateral: Secondary | ICD-10-CM | POA: Diagnosis not present

## 2023-06-11 ENCOUNTER — Ambulatory Visit: Payer: PPO | Admitting: Family Medicine

## 2023-06-11 ENCOUNTER — Other Ambulatory Visit: Payer: Self-pay | Admitting: Family Medicine

## 2023-06-11 DIAGNOSIS — E039 Hypothyroidism, unspecified: Secondary | ICD-10-CM

## 2023-06-13 NOTE — Progress Notes (Signed)
CRISSIE, ALOI ([id number]) [id number] Page 1 of 7 Visit Report for 05/11/2023 Arrival Information Details Patient Name: Date of Service: [name], [name] 05/11/2023 1:45 PM Medical Record Number: [id number] Patient Account Number: [id number] Date of Birth/Sex: Treating RN: 1946-05-18 (77 y.o. F) Primary Care Lauri Purdum: Saralyn Pilar Other Clinician: Referring Ailea Rhatigan: Treating Cambria Osten/Extender: Park Liter in Treatment: 31 Visit Information History Since Last Visit Added or deleted any medications: No Patient Arrived: Ambulatory Any new allergies or adverse reactions: No Arrival Time: 13:44 Had a fall or experienced change in No Accompanied By: husband activities of daily living that may affect Transfer Assistance: None risk of falls: Patient Identification Verified: Yes Signs or symptoms of abuse/neglect since last visito No Secondary Verification Process Completed: Yes Hospitalized since last visit: No Patient Requires Transmission-Based Precautions: No Implantable device outside of the clinic excluding No Patient Has Alerts: No cellular tissue based products placed in the center since last visit: Has Dressing in Place as Prescribed: Yes Pain Present Now: No Electronic Signature(s) Signed: 05/11/2023 2:18:04 PM By: Karl Ito Entered By: Karl Ito on 05/11/2023 13:49:47 -------------------------------------------------------------------------------- Clinic Level of Care Assessment Details Patient Name: Date of Service: [name], [name] 05/11/2023 1:45 PM Medical Record Number: [id number] Patient Account Number: [id number] Date of Birth/Sex: Treating RN: 03-Jun-1946 (77 y.o. Gevena Mart Primary Care Nas Wafer: Saralyn Pilar Other Clinician: Referring Maxton Noreen: Treating Declynn Lopresti/Extender: Park Liter in Treatment: 31 Clinic Level of Care Assessment Items TOOL 1  Quantity Score X- 1 0 Use when EandM and Procedure is performed on INITIAL visit ASSESSMENTS - Nursing Assessment / Reassessment X- 1 20 General Physical Exam (combine w/ comprehensive assessment (listed just below) when performed on new pt. evals) X- 1 25 Comprehensive Assessment (HX, ROS, Risk Assessments, Wounds Hx, etc.) ASSESSMENTS - Wound and Skin Assessment / Reassessment X- 1 10 Dermatologic / Skin Assessment (not related to wound area) ASSESSMENTS - Ostomy and/or Continence Assessment and Care []  - 0 Incontinence Assessment and Management []  - 0 Ostomy Care Assessment and Management (repouching, etc.) PROCESS - Coordination of Care X - Simple Patient / Family Education for ongoing care 1 15 []  - 0 Complex (extensive) Patient / Family Education for ongoing care [place], [place] [name] ([id number]) [id number] Page 2 of 7 []  - 0 Staff obtains Consents, Records, T Results / Process Orders est X- 1 10 Staff telephones HHA, Nursing Homes / Clarify orders / etc []  - 0 Routine Transfer to another Facility (non-emergent condition) []  - 0 Routine Hospital Admission (non-emergent condition) []  - 0 New Admissions / Manufacturing engineer / Ordering NPWT Apligraf, etc. , []  - 0 Emergency Hospital Admission (emergent condition) PROCESS - Special Needs []  - 0 Pediatric / Minor Patient Management []  - 0 Isolation Patient Management []  - 0 Hearing / Language / Visual special needs []  - 0 Assessment of Community assistance (transportation, D/C planning, etc.) []  - 0 Additional assistance / Altered mentation []  - 0 Support Surface(s) Assessment (bed, cushion, seat, etc.) INTERVENTIONS - Miscellaneous []  - 0 External ear exam []  - 0 Patient Transfer (multiple staff / Nurse, adult / Similar devices) []  - 0 Simple Staple / Suture removal (25 or less) []  - 0 Complex Staple / Suture removal (26 or more) []  - 0 Hypo/Hyperglycemic Management (do not check  if billed separately) []  - 0 Ankle / Brachial Index (ABI) - do not check if billed separately Has the patient been seen at the hospital within the last three years: Yes  Total Score: 80 Level Of Care: New/Established - Level 3 Electronic Signature(s) Signed: 06/13/2023 8:00:09 AM By: Brenton Grills Entered By: Brenton Grills on 05/11/2023 14:07:37 -------------------------------------------------------------------------------- Encounter Discharge Information Details Patient Name: Date of Service: [name], [name]. 05/11/2023 1:45 PM Medical Record Number: [id number] Patient Account Number: [id number] Date of Birth/Sex: Treating RN: 1945/12/19 (77 y.o. Gevena Mart Primary Care Jonh Mcqueary: Saralyn Pilar Other Clinician: Referring Ajia Chadderdon: Treating Chardonnay Holzmann/Extender: Park Liter in Treatment: 31 Encounter Discharge Information Items Discharge Condition: Stable Ambulatory Status: Ambulatory Discharge Destination: Home Transportation: Private Auto Accompanied By: spouse Schedule Follow-up Appointment: Yes Clinical Summary of Care: Patient Declined Electronic Signature(s) Signed: 06/13/2023 8:00:09 AM By: Brenton Grills Entered By: Brenton Grills on 05/11/2023 14:13:21 [name] ([id number]) [id number] Page 3 of 7 -------------------------------------------------------------------------------- Lower Extremity Assessment Details Patient Name: Date of Service: [name], [name] 05/11/2023 1:45 PM Medical Record Number: [id number] Patient Account Number: [id number] Date of Birth/Sex: Treating RN: Aug 10, 1946 (77 y.o. Gevena Mart Primary Care Aaralynn Shepheard: Saralyn Pilar Other Clinician: Referring Najia Hurlbutt: Treating Eldoris Beiser/Extender: Harrie Jeans Weeks in Treatment: 31 Edema Assessment Assessed: Kyra Searles: No] Franne Forts: No] Edema: [Left: Ye] [Right: s] Calf Left: Right: Point of Measurement:  From Medial Instep 30.2 cm Ankle Left: Right: Point of Measurement: From Medial Instep 20.4 cm Vascular Assessment Pulses: Dorsalis Pedis Palpable: [Right:Yes] Electronic Signature(s) Signed: 06/13/2023 8:00:09 AM By: Brenton Grills Entered By: Brenton Grills on 05/11/2023 14:00:24 -------------------------------------------------------------------------------- Multi Wound Chart Details Patient Name: Date of Service: [name], [name]. 05/11/2023 1:45 PM Medical Record Number: [id number] Patient Account Number: [id number] Date of Birth/Sex: Treating RN: January 13, 1946 (77 y.o. F) Primary Care Annaston Upham: Saralyn Pilar Other Clinician: Referring Karianne Nogueira: Treating Madisen Ludvigsen/Extender: Park Liter in Treatment: 31 Vital Signs Height(in): 65 Pulse(bpm): 73 Weight(lbs): 229 Blood Pressure(mmHg): 163/79 Body Mass Index(BMI): 38.1 Temperature(F): 98.0 Respiratory Rate(breaths/min): 18 [6:Photos:] [N/A:N/A] Right, Lateral Ankle N/A N/A Wound Location: Gradually Appeared N/A N/A Wounding Event: Venous Leg Ulcer N/A N/A Primary Etiology: Cataracts, Anemia, Lymphedema, N/A N/A Comorbid History: Congestive Heart Failure, Hypertension, Peripheral Venous Disease, Osteoarthritis, Neuropathy, Confinement Anxiety 09/25/2022 N/A N/A Date Acquired: 31 N/A N/A Weeks of Treatment: Open N/A N/A Wound Status: No N/A N/A Wound Recurrence: 0x0x0 N/A N/A Measurements L x W x D (cm) 0 N/A N/A A (cm) : rea 0 N/A N/A Volume (cm) : 100.00% N/A N/A % Reduction in Area: 100.00% N/A N/A % Reduction in Volume: Full Thickness Without Exposed N/A N/A Classification: Support Structures Small N/A N/A Exudate Amount: Serous N/A N/A Exudate Type: amber N/A N/A Exudate Color: Flat and Intact N/A N/A Wound Margin: Large (67-100%) N/A N/A Granulation Amount: Red, Pink N/A N/A Granulation Quality: Small (1-33%) N/A N/A Necrotic Amount: Fat Layer (Subcutaneous  Tissue): Yes N/A N/A Exposed Structures: Fascia: No Tendon: No Muscle: No Joint: No Bone: No Medium (34-66%) N/A N/A Epithelialization: No Abnormalities Noted N/A N/A Periwound Skin Texture: Maceration: No N/A N/A Periwound Skin Moisture: Dry/Scaly: No Hemosiderin Staining: Yes N/A N/A Periwound Skin Color: Erythema: No No Abnormality N/A N/A Temperature: Treatment Notes Wound #6 (Ankle) Wound Laterality: Right, Lateral Cleanser Peri-Wound Care Topical Primary Dressing Secondary Dressing Secured With Compression Wrap Compression Stockings Add-Ons Electronic Signature(s) Signed: 05/11/2023 2:38:58 PM By: Duanne Guess MD FACS Entered By: Duanne Guess on 05/11/2023 14:38:57 -------------------------------------------------------------------------------- Multi-Disciplinary Care Plan Details Patient Name: Date of Service: [name], [name]. 05/11/2023 1:45 PM Medical Record Number: [id number] Patient Account Number: [id number] Date of Birth/Sex: Treating RN: 16-May-1946 (76 y.o.  Gevena Mart Primary Care Elonda Giuliano: Saralyn Pilar Other Clinician: Referring Crimson Beer: Treating Emran Molzahn/Extender: Harrie Jeans [place], [place] [name] ([id number]) [phone].pdf Page 5 of 7 Weeks in Treatment: 31 Multidisciplinary Care Plan reviewed with physician Active Inactive Electronic Signature(s) Signed: 06/13/2023 8:00:09 AM By: Brenton Grills Entered By: Brenton Grills on 05/11/2023 14:12:41 -------------------------------------------------------------------------------- Pain Assessment Details Patient Name: Date of Service: AMIEL, SHARROW 05/11/2023 1:45 PM Medical Record Number: [id number] Patient Account Number: [id number] Date of Birth/Sex: Treating RN: 02-Dec-1945 (77 y.o. F) Primary Care Manie Bealer: Saralyn Pilar Other Clinician: Referring Dannel Rafter: Treating Kentrail Shew/Extender: Park Liter in  Treatment: 31 Active Problems Location of Pain Severity and Description of Pain Patient Has Paino No Site Locations Pain Management and Medication Current Pain Management: Electronic Signature(s) Signed: 05/11/2023 2:18:04 PM By: Karl Ito Entered By: Karl Ito on 05/11/2023 13:50:23 -------------------------------------------------------------------------------- Patient/Caregiver Education Details Patient Name: Date of Service: [name], [name] 6/14/2024andnbsp1:45 PM Medical Record Number: [id number] Patient Account Number: [id number] Date of Birth/Gender: Treating RN: [birth year] (77 y.o. Gevena Mart Primary Care Physician: Saralyn Pilar Other Clinician: [name] ([id number]) [id number] Page 6 of 7 Referring Physician: Treating Physician/Extender: Park Liter in Treatment: 31 Education Assessment Education Provided To: Patient and Caregiver Education Topics Provided Wound/Skin Impairment: Methods: Explain/Verbal Responses: State content correctly Electronic Signature(s) Signed: 06/13/2023 8:00:09 AM By: Brenton Grills Entered By: Brenton Grills on 05/11/2023 14:01:31 -------------------------------------------------------------------------------- Wound Assessment Details Patient Name: Date of Service: [name], [name] 05/11/2023 1:45 PM Medical Record Number: [id number] Patient Account Number: [id number] Date of Birth/Sex: Treating RN: 1946-01-06 (77 y.o. F) Primary Care Jaquan Sadowsky: Saralyn Pilar Other Clinician: Referring Yosgar Demirjian: Treating Monigue Spraggins/Extender: Harrie Jeans Weeks in Treatment: 31 Wound Status Wound Number: 6 Primary Venous Leg Ulcer Etiology: Wound Location: Right, Lateral Ankle Wound Open Wounding Event: Gradually Appeared Status: Date Acquired: 09/25/2022 Comorbid Cataracts, Anemia, Lymphedema, Congestive Heart Failure, Weeks Of  Treatment: 31 History: Hypertension, Peripheral Venous Disease, Osteoarthritis, Clustered Wound: No Neuropathy, Confinement Anxiety Photos Wound Measurements Length: (cm) Width: (cm) Depth: (cm) Area: (cm) Volume: (cm) 0 % Reduction in Area: 100% 0 % Reduction in Volume: 100% 0 Epithelialization: Medium (34-66%) 0 0 Wound Description Classification: Full Thickness Without Exposed Support Structures Wound Margin: Flat and Intact Exudate Amount: Small Exudate Type: Serous Exudate Color: amber [name], [name] ([id number]) Wound Bed Granulation Amount: Large (67-100%) Granulation Quality: Red, Pink Necrotic Amount: Small (1-33%) Foul Odor After Cleansing: No Slough/Fibrino Yes [phone].pdf Page 7 of 7 Exposed Structure Fascia Exposed: No Fat Layer (Subcutaneous Tissue) Exposed: Yes Tendon Exposed: No Muscle Exposed: No Joint Exposed: No Bone Exposed: No Periwound Skin Texture Texture Color No Abnormalities Noted: Yes No Abnormalities Noted: No Erythema: No Moisture Hemosiderin Staining: Yes No Abnormalities Noted: No Dry / Scaly: No Temperature / Pain Maceration: No Temperature: No Abnormality Electronic Signature(s) Signed: 05/11/2023 2:18:04 PM By: Karl Ito Entered By: Karl Ito on 05/11/2023 13:51:19 -------------------------------------------------------------------------------- Vitals Details Patient Name: Date of Service: [name], [name]. 05/11/2023 1:45 PM Medical Record Number: [id number] Patient Account Number: [id number] Date of Birth/Sex: Treating RN: May 25, 1946 (77 y.o. F) Primary Care Tamiah Dysart: Saralyn Pilar Other Clinician: Referring Tailer Volkert: Treating Suprena Travaglini/Extender: Park Liter in Treatment: 31 Vital Signs Time Taken: 13:50 Temperature (F): 98.0 Height (in): 65 Pulse (bpm): 73 Weight (lbs): 229 Respiratory Rate (breaths/min): 18 Body Mass Index (BMI): 38.1 Blood  Pressure (mmHg): 163/79 Reference Range: 80 - 120 mg / dl Electronic Signature(s) Signed: 05/11/2023 2:18:04 PM By:  Dawkins, Destiny Entered By: Karl Ito on 05/11/2023 13:50:16

## 2023-06-20 DIAGNOSIS — H25811 Combined forms of age-related cataract, right eye: Secondary | ICD-10-CM | POA: Diagnosis not present

## 2023-06-20 DIAGNOSIS — H268 Other specified cataract: Secondary | ICD-10-CM | POA: Diagnosis not present

## 2023-06-21 ENCOUNTER — Telehealth: Payer: Self-pay | Admitting: Cardiology

## 2023-06-21 DIAGNOSIS — R011 Cardiac murmur, unspecified: Secondary | ICD-10-CM

## 2023-06-21 NOTE — Telephone Encounter (Signed)
Patient stated she had cataract surgery recently and the anestiosologist rated her hearst murmur  a 5 or . She states that is a big change from last year. She states she feel fine. Offered appointment with APP she declined. Has appointment with you 9/16

## 2023-06-21 NOTE — Telephone Encounter (Signed)
Pt would like a callback from nurses regarding Heart Murmur and being seen sooner than 1 year f/u in October. Please advise

## 2023-06-27 ENCOUNTER — Encounter: Payer: Self-pay | Admitting: Family Medicine

## 2023-07-02 ENCOUNTER — Other Ambulatory Visit: Payer: Self-pay | Admitting: Family Medicine

## 2023-07-02 DIAGNOSIS — E782 Mixed hyperlipidemia: Secondary | ICD-10-CM

## 2023-07-02 DIAGNOSIS — K219 Gastro-esophageal reflux disease without esophagitis: Secondary | ICD-10-CM

## 2023-07-04 ENCOUNTER — Other Ambulatory Visit: Payer: Self-pay | Admitting: Family Medicine

## 2023-07-10 DIAGNOSIS — H25812 Combined forms of age-related cataract, left eye: Secondary | ICD-10-CM | POA: Diagnosis not present

## 2023-07-18 ENCOUNTER — Encounter: Payer: Self-pay | Admitting: Cardiology

## 2023-07-18 DIAGNOSIS — H268 Other specified cataract: Secondary | ICD-10-CM | POA: Diagnosis not present

## 2023-07-18 DIAGNOSIS — H25812 Combined forms of age-related cataract, left eye: Secondary | ICD-10-CM | POA: Diagnosis not present

## 2023-07-18 NOTE — Telephone Encounter (Signed)
I think we can wait till September.  Would be reasonable to get an echocardiogram before I see her back though that would save time.  She has a diagnosis of systolic ejection murmur listed in problem list.  Bryan Lemma, MD

## 2023-07-19 NOTE — Telephone Encounter (Signed)
Spoke to patient . Aware Dr Herbie Baltimore would like a echo done prior to appt.   Patient verbalized understanding.  She states she will be at the eye doctor today because he had anther cataract eye surgery  but office can leave date and time on voicemail  for Echo  Rn informed patient test will be done at [address]

## 2023-07-19 NOTE — Addendum Note (Signed)
Addended by: Tobin Chad on: 07/19/2023 09:25 AM   Modules accepted: Orders

## 2023-08-06 ENCOUNTER — Ambulatory Visit (INDEPENDENT_AMBULATORY_CARE_PROVIDER_SITE_OTHER): Payer: PPO | Admitting: Family Medicine

## 2023-08-06 ENCOUNTER — Encounter: Payer: Self-pay | Admitting: Family Medicine

## 2023-08-06 VITALS — BP 153/83 | HR 63 | Resp 18 | Ht 65.0 in | Wt 228.0 lb

## 2023-08-06 DIAGNOSIS — E782 Mixed hyperlipidemia: Secondary | ICD-10-CM

## 2023-08-06 DIAGNOSIS — I10 Essential (primary) hypertension: Secondary | ICD-10-CM

## 2023-08-06 DIAGNOSIS — Z1159 Encounter for screening for other viral diseases: Secondary | ICD-10-CM

## 2023-08-06 DIAGNOSIS — Z23 Encounter for immunization: Secondary | ICD-10-CM

## 2023-08-06 DIAGNOSIS — E039 Hypothyroidism, unspecified: Secondary | ICD-10-CM | POA: Diagnosis not present

## 2023-08-06 NOTE — Assessment & Plan Note (Signed)
Last TSH within normal limits.  Continue levothyroxine 125 mcg daily.  Patient does not have any symptoms that are concerning for any thyroid issues.  Will recheck thyroid labs and make medication adjustments at that time if indicated.

## 2023-08-06 NOTE — Assessment & Plan Note (Addendum)
BP goal <130/80. Above goal.  Continue benazepril-hydrochlorothiazide 20-25 mg daily, carvedilol 6.25 mg twice daily, furosemide 20 mg as needed.  Will continue to monitor.  Upcoming appointment with cardiology, they can make any adjustments that they deem necessary at that time.

## 2023-08-06 NOTE — Assessment & Plan Note (Signed)
Last lipid panel: LDL 63, HDL 88, triglycerides 104.  Rechecking lipid panel and CMP for medication monitoring.  Continue rosuvastatin 5 mg daily unless lab results warrant change.

## 2023-08-06 NOTE — Progress Notes (Signed)
Established Patient Office Visit  Subjective   Patient ID: [name], female    DOB: December 18, 1945  Age: 77 y.o. MRN: [id number]  Chief Complaint  Patient presents with   Hypertension    HPI [name] is a 77 y.o. female presenting today for follow up of hypertension, hyperlipidemia, hypothyroid.  She recently had cataract surgery, and the anesthesiologist identified a 5/6 murmur at the time.  This is a change from the slight murmur that she has had for quite some time, so she is following up with cardiology on 08/13/2023 with an echocardiogram a few days prior to that. Hypertension: Upcoming appointment with cardiology on 08/13/2023. Pt denies chest pain, SOB, dizziness, edema, syncope, fatigue or heart palpitations. Taking benazepril-hydrochlorothiazide, carvedilol, Lasix, reports excellent compliance with treatment. Denies side effects.  She has not been checking her blood pressure at home. Hyperlipidemia: tolerating rosuvastatin well with no myalgias or significant side effects.  The 10-year ASCVD risk score (Arnett DK, et al., 2019) is: 32.1% Hypothyroidism: Taking levothyroxine 125 mcg regularly in the AM away from food and vitamins. Denies fatigue, weight changes, heat/cold intolerance, skin/hair changes, bowel changes, CVS symptoms.  ROS Negative unless otherwise noted in HPI   Objective:     BP (!) 153/83   Pulse 63   Resp 18   Ht 5\' 5"  (1.651 m)   Wt 228 lb (103.4 kg)   SpO2 97%   BMI 37.94 kg/m   Physical Exam Constitutional:      General: She is not in acute distress.    Appearance: Normal appearance.  HENT:     Head: Normocephalic and atraumatic.  Cardiovascular:     Rate and Rhythm: Normal rate and regular rhythm.     Heart sounds: Murmur heard.     No friction rub. No gallop.  Pulmonary:     Effort: Pulmonary effort is normal. No respiratory distress.     Breath sounds: No wheezing, rhonchi or rales.  Skin:    General: Skin is warm and  dry.  Neurological:     Mental Status: She is alert and oriented to person, place, and time.     Assessment & Plan:  Essential hypertension Assessment & Plan: BP goal <130/80. Above goal.  Continue benazepril-hydrochlorothiazide 20-25 mg daily, carvedilol 6.25 mg twice daily, furosemide 20 mg as needed.  Will continue to monitor.  Upcoming appointment with cardiology, they can make any adjustments that they deem necessary at that time.  Orders: -     CBC with Differential/Platelet; Future -     Comprehensive metabolic panel; Future  Mixed hyperlipidemia Assessment & Plan: Last lipid panel: LDL 63, HDL 88, triglycerides 104.  Rechecking lipid panel and CMP for medication monitoring.  Continue rosuvastatin 5 mg daily unless lab results warrant change.  Orders: -     CBC with Differential/Platelet; Future -     Comprehensive metabolic panel; Future -     Lipid panel; Future  Hypothyroidism, unspecified type Assessment & Plan: Last TSH within normal limits.  Continue levothyroxine 125 mcg daily.  Patient does not have any symptoms that are concerning for any thyroid issues.  Will recheck thyroid labs and make medication adjustments at that time if indicated.  Orders: -     TSH; Future -     T4, free; Future  Morbid obesity (HCC) -     CBC with Differential/Platelet; Future -     Comprehensive metabolic panel; Future -     Lipid panel; Future -  Hemoglobin A1c; Future -     TSH; Future -     T4, free; Future  Need for influenza vaccination -     Flu Vaccine Trivalent High Dose (Fluad)  Screening for viral disease -     Hepatitis C antibody; Future  Updating labs, have not been checked since 09/2022.  Return in about 4 months (around 12/06/2023) for follow-up for hypertension, hyperlipidemia, hypothyroid.  She will return in 2 weeks for fasting labs and a nurse blood pressure visit to ensure blood pressure is at goal after adjustments.   Melida Quitter, PA

## 2023-08-06 NOTE — Patient Instructions (Addendum)
Check your blood pressure at home for the next 2 weeks. This will allow cardiology to adjust your medicine if they need to. Bring the log to your lab appointment and we will also check your blood pressure at that time!

## 2023-08-08 ENCOUNTER — Ambulatory Visit (HOSPITAL_COMMUNITY): Payer: PPO | Attending: Cardiology

## 2023-08-08 DIAGNOSIS — R011 Cardiac murmur, unspecified: Secondary | ICD-10-CM | POA: Diagnosis not present

## 2023-08-08 LAB — ECHOCARDIOGRAM COMPLETE
Area-P 1/2: 3.48 cm2
P 1/2 time: 541 ms
S' Lateral: 2.9 cm

## 2023-08-13 ENCOUNTER — Encounter: Payer: Self-pay | Admitting: Cardiology

## 2023-08-13 ENCOUNTER — Ambulatory Visit: Payer: PPO | Attending: Cardiology | Admitting: Cardiology

## 2023-08-13 VITALS — BP 140/88 | HR 70 | Ht 65.5 in | Wt 227.6 lb

## 2023-08-13 DIAGNOSIS — I10 Essential (primary) hypertension: Secondary | ICD-10-CM | POA: Diagnosis not present

## 2023-08-13 DIAGNOSIS — I872 Venous insufficiency (chronic) (peripheral): Secondary | ICD-10-CM

## 2023-08-13 DIAGNOSIS — R011 Cardiac murmur, unspecified: Secondary | ICD-10-CM

## 2023-08-13 DIAGNOSIS — E782 Mixed hyperlipidemia: Secondary | ICD-10-CM

## 2023-08-13 DIAGNOSIS — F1010 Alcohol abuse, uncomplicated: Secondary | ICD-10-CM

## 2023-08-13 DIAGNOSIS — I350 Nonrheumatic aortic (valve) stenosis: Secondary | ICD-10-CM

## 2023-08-13 DIAGNOSIS — G629 Polyneuropathy, unspecified: Secondary | ICD-10-CM

## 2023-08-13 DIAGNOSIS — M792 Neuralgia and neuritis, unspecified: Secondary | ICD-10-CM | POA: Diagnosis not present

## 2023-08-13 DIAGNOSIS — I5032 Chronic diastolic (congestive) heart failure: Secondary | ICD-10-CM | POA: Diagnosis not present

## 2023-08-13 MED ORDER — CARVEDILOL 12.5 MG PO TABS: 12.5000 mg | ORAL_TABLET | Freq: Two times a day (BID) | ORAL | 3 refills | Status: DC

## 2023-08-13 NOTE — Progress Notes (Signed)
Cardiology Office Note:  .   Date:  08/18/2023  ID:  [name], DOB Nov 23, 1946, MRN [id number] PCP: Melida Quitter, PA  [facility] Providers Cardiologist:  Bryan Lemma, MD     Chief Complaint  Patient presents with   Follow-up    Annual follow-up; has lost weight.  Neuropathy improved.   Hypertension    BP has been high often in the 150s / 80s    History of Present Illness: .     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  PMH: Chronic HFpEF w/ chronic LE edema complicated by venous insufficiency HTN, HLD Obesity-history of GOP surgery History of EtOH abuse History of GI bleed  [name] was last seen on 09/25/22: Was doing well.  Having issues with right ankle ulcer.  Stable swelling.  No real palpitations or irregular heartbeats.  Balance was little bit better with no blackout spells or falls.  Doing better with hydration.  Some peripheral neuropathy baseline fatigue.  Sedentary.  No chest pain or pressure.  Does not walk on for claudication.     Subjective  INTERVAL HISTORY [name] presents here today a little bit concerned because she just had eye surgery and the anesthesiologist was very concerned about a 4 out of 6 murmur.  Echo ordered without any evidence of aortic stenosis.  She notes blood pressures at home are really ranging from the 140s to 150s over 80s to 90s.  As far as her edema goes, she is maybe taking her Lasix 2 or 3 times a month.  Overall, she is doing fairly well from a cardiac standpoint.  Trying to be more energetic and increased level of activity.  Has a baseline dyspnea, but overall seems to be stable.  Exertional dyspnea is pretty much stable with no PND orthopnea.  She says that overall of the last several months she did therapy for her venous stasis and  peripheral neuropathy and it is definitely improved.  Her legs are much better.  Peripheral neuropathy is also improving.  They are doing light therapy and compressions etc.  Different exercises with pulse-wave therapy.  She is also started a gluten-free diet and has noted significant improvement with her neuropathy from that as well.  With this change in diet and increasing her activity level, she actually has noted a drop in weight.  She previously would drink several glasses of wine a day but has subsequently switched to vodka and has now decreased from 3 to 4 glasses of vodka a day to 2-3 drinks plus maybe a cup glass of wine for meals on occasion.  States somewhat jokingly talked about this.  We talked about cutting down and quitting however I be worried about potential for withdrawal.  Cardiovascular ROS: positive for - dyspnea on exertion, edema, and pretty stable for both negative for - chest pain, irregular heartbeat, orthopnea, palpitations, paroxysmal nocturnal dyspnea, rapid heart rate, shortness of breath, or syncope or near syncope, TIA or fugax, claudication . ROS:   Review of Systems - Negative except symptoms noted below Constitutional:  Positive for overall improved energy level.  Also noted weight loss. HENT: Mild allergy symptoms. Respiratory:  Positive for stable baseline exertional dyspnea Cardiovascular:  Positive for stable leg swelling.  Peripheral neuropathy notably improved.  The ulcer on her right leg is improved.  Gastrointestinal:  Negative for abdominal pain, blood in stool, melena and vomiting.  Genitourinary:  Negative for frequency.  Musculoskeletal:  Positive for back pain (Limits mobility.), joint pain and neck pain.       Unable to lie flat because of back pain; walks with a walker. Neurological:  Positive for dizziness (poor balance p.o. orthostatic) and leg weakness Psychiatric/Behavioral:  Negative for depression and memory loss. The patient is  nervous/anxious. The patient does not have insomnia.     Objective   Studies Reviewed: Marland Kitchen   EKG Interpretation Date/Time:  Monday August 13 2023 10:31:34 EDT Ventricular Rate:  72 PR Interval:  198 QRS Duration:  92 QT Interval:  422 QTC Calculation: 462 R Axis:   26  Text Interpretation: Normal sinus rhythm Normal ECG When compared with ECG of 24-May-2020 15:48, No significant change was found Confirmed by Bryan Lemma ([id number]) on 08/18/2023 8:27:36 PM    ECHO 08/12/2023: Normal EF 60 to 65%.  No RWMA.  Indeterminate diastolic parameters.  Moderate LA dilation would suggest elevated pressures.  Normal RV function with normal RVP, and mildly elevated RAP.  Mild MR.  AOV sclerosis with no stenosis.  Aortic dilation roughly 40 mm. Ordered b/c anesthesiologist heard murmur   Risk Assessment/Calculations:     HYPERTENSION CONTROL Vitals:   08/13/23 1025 08/13/23 2234  BP: (!) 142/94 (!) 140/88    The patient's blood pressure is elevated above target today.  In order to address the patient's elevated BP: A current anti-hypertensive medication was adjusted today.; The blood pressure is usually elevated in clinic.  Blood pressures monitored at home have been optimal.; Follow up with general cardiology has been recommended. (Titrate carvedilol up to 12.5 mg twice daily gradually.  Reassess in roughly 6-8 months-via virtual follow-up)        Physical Exam:   VS:  BP (!) 140/88   Pulse 70   Ht 5' 5.5" (1.664 m)   Wt 227 lb 9.6 oz (103.2 kg)   SpO2 98%   BMI 37.30 kg/m    Wt Readings from Last 3 Encounters:  08/13/23 227 lb 9.6 oz (103.2 kg)  08/06/23 228 lb (103.4 kg)  02/07/23 236 lb (107 kg)    GEN: Well nourished; in no acute distress; chronically ill-appearing.  Wheelchair-bound.  Morbidly obese. NECK: No JVD; No carotid bruits CARDIAC: Distant heart sounds normal S1, S2; RRR, 3/6 SEM at RUSB.  No  rubs, gallops; diminished pulses RESPIRATORY:  Clear to auscultation  without rales, wheezing or rhonchi ; nonlabored, good air movement.  (Distant breath sounds ABDOMEN: Soft, non-tender, non-distended EXTREMITIES: 1+ right, 2+ left lower extremity edema; No deformity; wearing support stockings.  Venous stasis dermatitis changes.;  Right leg ulcer     ASSESSMENT AND PLAN: .    Problem List Items Addressed This Visit       Cardiology Problems   Chronic heart failure with preserved ejection fraction (HFpEF) (HCC) (Chronic)    I do not really think she is truthfully having heart failure symptoms.  I think most of her issues are related to venous stasis related edema. Does not have any PND orthopnea per se.  She does not sleep laying down because of back pain.  No real change in her exertional dyspnea.  Euvolemic on exam.  Plan:  Continue combination of aspirin and HCTZ 20-25 mg daily With elevated BP levels, will titrate carvedilol up to 12.5 mg twice daily-(2 weeks increasing the nighttime dose and then increase to twice daily.)  Still using as needed Lasix.  Discussed importance of foot elevation and support stockings Her exercise routine/therapy for peripheral neuropathy seems to be helping considerably as well..      Relevant Medications   carvedilol (COREG) 12.5 MG tablet   Other Relevant Orders   EKG 12-Lead (Completed)   Chronic venous stasis dermatitis of both lower extremities (Chronic)    Left leg still has more swelling than the right.  Being managed along with neuropathy with therefore exercises, pulse-wave and red light therapy.  This in conjunction with foot elevation and compression stockings. Balance seems to have improved and neuropathy also improved.      Relevant Medications   carvedilol (COREG) 12.5 MG tablet   Essential hypertension - Primary (Chronic)   Relevant Medications   carvedilol (COREG) 12.5 MG tablet   Other Relevant Orders   EKG 12-Lead (Completed)   Mixed hyperlipidemia (Chronic)    Labs well-controlled. Continue  current dose of statin      Relevant Medications   carvedilol (COREG) 12.5 MG tablet   Senile calcific aortic valve sclerosis (Chronic)    Documented previously on exam having a 2 out of 6 murmur.  It is louder.  When I review her echo there is a focal area of calcification along one of the leaflets which is likely the cause of the loud murmur.  Does not appear to be a mass or vegetation.  Simply senile calcification.  Would likely recheck echo in maybe 2 years depending on murmur.      Relevant Medications   carvedilol (COREG) 12.5 MG tablet     Other   Alcohol abuse-with elevated ALT and AST (Chronic)    She is now drinking more vodka than mine, and has cut down from 3-4 drinks to 2-3 drinks.  I continue continue to recommend that she works on cutting down even further.  Not helping with her nutrition and weight as well as blood pressure.      Morbid obesity (HCC) (Chronic)    Finally losing weight.  Hopefully with more increased activity and dietary changes she will do lose more weight.  We talked briefly about GLP-1 agonists, can consider in the future.  Needs to decrease alcohol consumption as well.      Peripheral neuropathy due to edema b/l feet (Chronic)    Seems to be doing much better with current therapy.  Continue      Systolic ejection murmur (Chronic)    Slightly louder murmur on exam and noted by anesthesiologist.  Confirmed on echo to simply be aortic sclerosis but no stenosis.           Dispo: Return in about 6 weeks (around 09/24/2023) for Routine Follow-up after testing ~ 1-2 months, Followup with Telemedicine.  Total time spent: 30 min spent with patient + 18 min spent charting = 48 min     Signed, Marykay Lex, MD, MS Bryan Lemma, M.D., M.S. Interventional Cardiologist  [facility]  Pager # [phone] Phone # [phone] [address] [place], [place] [address]

## 2023-08-13 NOTE — Patient Instructions (Addendum)
Medication Instructions:   Starting tonight  increase Carvedilol to 2 tablets  ( total 12.5 mg ) at night  , keep same morning dose .   After one week  increase  to 12.5 mg  twice a day  New prescription sent to pharmacy    *If you need a refill on your cardiac medications before your next appointment, please call your pharmacy*   Lab Work:  Not needed  If you have labs (blood work) drawn today and your tests are completely normal, you will receive your results only by: MyChart Message (if you have MyChart) OR A paper copy in the mail If you have any lab test that is abnormal or we need to change your treatment, we will call you to review the results.   Testing/Procedures: Not needed   Follow-Up: At [facility], you and your health needs are our priority.  As part of our continuing mission to provide you with exceptional heart care, we have created designated Provider Care Teams.  These Care Teams include your primary Cardiologist (physician) and Advanced Practice Providers (APPs -  Physician Assistants and Nurse Practitioners) who all work together to provide you with the care you need, when you need it.  We recommend signing up for the patient portal called "MyChart".  Sign up information is provided on this After Visit Summary.  MyChart is used to connect with patients for Virtual Visits (Telemedicine).  Patients are able to view lab/test results, encounter notes, upcoming appointments, etc.  Non-urgent messages can be sent to your provider as well.   To learn more about what you can do with MyChart, go to [email or web address].    Your next appointment:   6 week(s)  The format for your next appointment:   Virtual Visit   Provider:   Bryan Lemma, MD     Your physician recommends that you schedule a follow-up appointment in:  6 months with Bernadene Person NP , Marjie Skiff PA 12 months with Dr Herbie Baltimore      Other Instructions    Check blood pressure about 4 to 5  times a weeks at varies times, keep  a recording and present at next virtual appointment

## 2023-08-18 ENCOUNTER — Encounter: Payer: Self-pay | Admitting: Cardiology

## 2023-08-18 NOTE — Assessment & Plan Note (Signed)
Documented previously on exam having a 2 out of 6 murmur.  It is louder.  When I review her echo there is a focal area of calcification along one of the leaflets which is likely the cause of the loud murmur.  Does not appear to be a mass or vegetation.  Simply senile calcification.  Would likely recheck echo in maybe 2 years depending on murmur.

## 2023-08-18 NOTE — Assessment & Plan Note (Signed)
Seems to be doing much better with current therapy.  Continue

## 2023-08-18 NOTE — Assessment & Plan Note (Signed)
I do not really think she is truthfully having heart failure symptoms.  I think most of her issues are related to venous stasis related edema. Does not have any PND orthopnea per se.  She does not sleep laying down because of back pain.  No real change in her exertional dyspnea.  Euvolemic on exam.  Plan:  Continue combination of aspirin and HCTZ 20-25 mg daily With elevated BP levels, will titrate carvedilol up to 12.5 mg twice daily-(2 weeks increasing the nighttime dose and then increase to twice daily.) Still using as needed Lasix.  Discussed importance of foot elevation and support stockings Her exercise routine/therapy for peripheral neuropathy seems to be helping considerably as well.Marland Kitchen

## 2023-08-18 NOTE — Assessment & Plan Note (Signed)
Labs well-controlled. Continue current dose of statin

## 2023-08-18 NOTE — Assessment & Plan Note (Signed)
Left leg still has more swelling than the right.  Being managed along with neuropathy with therefore exercises, pulse-wave and red light therapy.  This in conjunction with foot elevation and compression stockings. Balance seems to have improved and neuropathy also improved.

## 2023-08-18 NOTE — Assessment & Plan Note (Signed)
She is now drinking more vodka than mine, and has cut down from 3-4 drinks to 2-3 drinks.  I continue continue to recommend that she works on cutting down even further.  Not helping with her nutrition and weight as well as blood pressure.

## 2023-08-18 NOTE — Assessment & Plan Note (Signed)
Slightly louder murmur on exam and noted by anesthesiologist.  Confirmed on echo to simply be aortic sclerosis but no stenosis.

## 2023-08-18 NOTE — Assessment & Plan Note (Signed)
Finally losing weight.  Hopefully with more increased activity and dietary changes she will do lose more weight.  We talked briefly about GLP-1 agonists, can consider in the future.  Needs to decrease alcohol consumption as well.

## 2023-08-20 ENCOUNTER — Ambulatory Visit (INDEPENDENT_AMBULATORY_CARE_PROVIDER_SITE_OTHER): Payer: PPO | Admitting: Family Medicine

## 2023-08-20 ENCOUNTER — Encounter: Payer: Self-pay | Admitting: Family Medicine

## 2023-08-20 DIAGNOSIS — E782 Mixed hyperlipidemia: Secondary | ICD-10-CM | POA: Diagnosis not present

## 2023-08-20 DIAGNOSIS — E039 Hypothyroidism, unspecified: Secondary | ICD-10-CM | POA: Diagnosis not present

## 2023-08-20 DIAGNOSIS — I10 Essential (primary) hypertension: Secondary | ICD-10-CM | POA: Diagnosis not present

## 2023-08-20 DIAGNOSIS — Z1159 Encounter for screening for other viral diseases: Secondary | ICD-10-CM | POA: Diagnosis not present

## 2023-08-20 NOTE — Progress Notes (Signed)
Labs only

## 2023-08-21 ENCOUNTER — Ambulatory Visit (INDEPENDENT_AMBULATORY_CARE_PROVIDER_SITE_OTHER): Payer: PPO | Admitting: Family Medicine

## 2023-08-21 VITALS — BP 104/63 | HR 76

## 2023-08-21 DIAGNOSIS — I10 Essential (primary) hypertension: Secondary | ICD-10-CM

## 2023-08-21 LAB — CBC WITH DIFFERENTIAL/PLATELET
Basophils Absolute: 0.1 10*3/uL (ref 0.0–0.2)
Basos: 1 %
EOS (ABSOLUTE): 0.3 10*3/uL (ref 0.0–0.4)
Eos: 4 %
Hematocrit: 42.8 % (ref 34.0–46.6)
Hemoglobin: 14 g/dL (ref 11.1–15.9)
Immature Grans (Abs): 0 10*3/uL (ref 0.0–0.1)
Immature Granulocytes: 0 %
Lymphocytes Absolute: 2.2 10*3/uL (ref 0.7–3.1)
Lymphs: 30 %
MCH: 34 pg — ABNORMAL HIGH (ref 26.6–33.0)
MCHC: 32.7 g/dL (ref 31.5–35.7)
MCV: 104 fL — ABNORMAL HIGH (ref 79–97)
Monocytes Absolute: 0.6 10*3/uL (ref 0.1–0.9)
Monocytes: 8 %
Neutrophils Absolute: 4.3 10*3/uL (ref 1.4–7.0)
Neutrophils: 57 %
Platelets: 235 10*3/uL (ref 150–450)
RBC: 4.12 x10E6/uL (ref 3.77–5.28)
RDW: 12.5 % (ref 11.7–15.4)
WBC: 7.6 10*3/uL (ref 3.4–10.8)

## 2023-08-21 LAB — COMPREHENSIVE METABOLIC PANEL
ALT: 19 IU/L (ref 0–32)
AST: 19 IU/L (ref 0–40)
Albumin: 4.5 g/dL (ref 3.8–4.8)
Alkaline Phosphatase: 112 IU/L (ref 44–121)
BUN/Creatinine Ratio: 24 (ref 12–28)
BUN: 19 mg/dL (ref 8–27)
Bilirubin Total: 0.7 mg/dL (ref 0.0–1.2)
CO2: 23 mmol/L (ref 20–29)
Calcium: 9.5 mg/dL (ref 8.7–10.3)
Chloride: 93 mmol/L — ABNORMAL LOW (ref 96–106)
Creatinine, Ser: 0.8 mg/dL (ref 0.57–1.00)
Globulin, Total: 2.2 g/dL (ref 1.5–4.5)
Glucose: 98 mg/dL (ref 70–99)
Potassium: 4.6 mmol/L (ref 3.5–5.2)
Sodium: 135 mmol/L (ref 134–144)
Total Protein: 6.7 g/dL (ref 6.0–8.5)
eGFR: 76 mL/min/{1.73_m2} (ref >59)

## 2023-08-21 LAB — HEMOGLOBIN A1C
Est. average glucose Bld gHb Est-mCnc: 108 mg/dL
Hgb A1c MFr Bld: 5.4 % (ref 4.8–5.6)

## 2023-08-21 LAB — LIPID PANEL
Chol/HDL Ratio: 2.4 ratio (ref 0.0–4.4)
Cholesterol, Total: 165 mg/dL (ref 100–199)
HDL: 70 mg/dL (ref >39)
LDL Chol Calc (NIH): 79 mg/dL (ref 0–99)
Triglycerides: 89 mg/dL (ref 0–149)
VLDL Cholesterol Cal: 16 mg/dL (ref 5–40)

## 2023-08-21 LAB — T4, FREE: Free T4: 1.28 ng/dL (ref 0.82–1.77)

## 2023-08-21 LAB — TSH: TSH: 0.75 u[IU]/mL (ref 0.450–4.500)

## 2023-08-21 LAB — HEPATITIS C ANTIBODY: Hep C Virus Ab: NONREACTIVE

## 2023-08-21 NOTE — Progress Notes (Signed)
Pt denies CP, SOB, dizziness, or heart palpitations. taking meds as directed without problems. Denies med side effects. 5 min spent with pt.

## 2023-08-29 ENCOUNTER — Other Ambulatory Visit: Payer: Self-pay | Admitting: Sports Medicine

## 2023-08-29 DIAGNOSIS — M170 Bilateral primary osteoarthritis of knee: Secondary | ICD-10-CM

## 2023-09-05 ENCOUNTER — Other Ambulatory Visit: Payer: Self-pay | Admitting: Family Medicine

## 2023-09-05 DIAGNOSIS — E782 Mixed hyperlipidemia: Secondary | ICD-10-CM

## 2023-09-05 DIAGNOSIS — I10 Essential (primary) hypertension: Secondary | ICD-10-CM

## 2023-09-12 ENCOUNTER — Other Ambulatory Visit: Payer: Self-pay | Admitting: Sports Medicine

## 2023-09-12 DIAGNOSIS — M170 Bilateral primary osteoarthritis of knee: Secondary | ICD-10-CM

## 2023-09-14 ENCOUNTER — Telehealth: Payer: Self-pay | Admitting: Cardiology

## 2023-09-14 ENCOUNTER — Ambulatory Visit: Payer: PPO | Attending: Cardiology | Admitting: Cardiology

## 2023-09-14 ENCOUNTER — Encounter: Payer: Self-pay | Admitting: Cardiology

## 2023-09-14 VITALS — BP 130/72 | HR 66 | Ht 65.0 in | Wt 224.0 lb

## 2023-09-14 DIAGNOSIS — I10 Essential (primary) hypertension: Secondary | ICD-10-CM | POA: Diagnosis not present

## 2023-09-14 DIAGNOSIS — I872 Venous insufficiency (chronic) (peripheral): Secondary | ICD-10-CM

## 2023-09-14 DIAGNOSIS — I5032 Chronic diastolic (congestive) heart failure: Secondary | ICD-10-CM

## 2023-09-14 MED ORDER — CARVEDILOL 12.5 MG PO TABS: ORAL_TABLET | ORAL | 3 refills | Status: DC

## 2023-09-14 NOTE — Progress Notes (Signed)
Virtual Visit via Telephone Note  Date:  09/14/2023  ID:  [name], [name] 1946-01-07, MRN [id number] PCP: Melida Quitter, PA  [facility] Cardiologist:  Bryan Lemma, MD {  Because of [name]'s co-morbid illnesses, she is at least at moderate risk for complications without adequate follow up.  This format is felt to be most appropriate for this patient at this time.  The patient did not have access to video technology/had technical difficulties with video requiring transitioning to audio format only (telephone).  All issues noted in this document were discussed and addressed.  No physical exam could be performed with this format.  Please refer to the patient's chart for her consent to telehealth for [name].   Patient has given verbal permission to conduct this visit via virtual appointment and to bill insurance 09/14/2023 1:51 PM     Evaluation Performed:  Follow-up visit  Date:  09/14/2023   ID:  [name], [name] 1946-10-11, MRN [id number]  Patient Location: Home Provider Location: Home Office  No chief complaint on file.   Patient Profile: .     [name] is a ~morbidly obese 77 y.o. female with a PMH noted below who presents here for 1 month virtual follow-up at the request of Melida Quitter, PA.  PMH: Chronic HFpEF w/ chronic LE edema complicated by venous insufficiency HTN, HLD Obesity-history of GOP surgery History of EtOH abuse -> currently drinking vodka as opposed to significant wine.  She also does drink wine. History of GI bleed    [name] was last seen on 08/13/2023-blood pressures were in the 140/90 range but at home often in the 150s over 80s.  No active heart failure symptoms.  Using as needed Lasix but not frequently.  Admitted to drinking more vodka as opposed to wine but still several drinks at night (4-5 vodka drinks but also wine with meals).  Venous stasis was doing much  better she has been doing pulse-wave therapy on her legs and they are doing much better.  Peripheral neuropathy is definitely improving as has the venous stasis.  She did make some notable changes in her diet and was trying to increase exercise level with hopes to lose weight but not doing well.  Still noted exertional dyspnea from fatigue and deconditioning.  Also edema which was pretty stable. -> Plan was to titrate up carvedilol to 12.5 mg daily reassess.  Subjective  Discussed the use of AI scribe software for clinical note transcription with the patient, who gave verbal consent to proceed.  History of Present Illness   The patient, with a history of hypertension, has been experiencing elevated blood pressure readings, averaging around 140/80. She reports that her blood pressure varies depending on her activities. She is currently on carvedilol 12.5mg  twice a day, benazepril, and ACTZ for blood pressure management.  The patient has also been experiencing increased swelling in her feet, which she attributes to excessive salt intake. She denies any shortness of breath, difficulty laying flat, or waking up due to breathlessness.  The patient has not required any additional Lasix and her heart rate has been stable at 66. Recent blood work to assess kidney function was reported as normal.  The patient acknowledges a need to reduce sodium intake and alcohol consumption, both of which are contributing to her hypertension. Despite these lifestyle factors, the patient reports feeling generally well.     Cardiovascular ROS: positive for - dyspnea on  exertion, irregular heartbeat, and both improved. negative for - chest pain, irregular heartbeat, orthopnea, palpitations, paroxysmal nocturnal dyspnea, rapid heart rate, shortness of breath, or syncope or near syncope, TIA or amaurosis fugax.  No claudication  ROS:  Review of Systems - Negative except symptoms noted above.  As back pain and joint pain that  limits her activity.  Has poor balance and some orthostatic.  Trying to be more active.  Less nervous     Objective   Studies Reviewed: Marland Kitchen       ECHO 08/08/2023: EF 60 to 65%.  No RWMA.  Mild LVH with indeterminate diastolic function.  Normal PAP.  Moderate LA dilation.  Mild MR with severe MAC.  AOV sclerosis but no stenosis.  Mild dilation of ascending aorta roughly 40 mm.  Borderline elevated RAP  Risk Assessment/Calculations:             Physical Exam:   VS:  BP 130/72 (BP Location: Right Arm, Patient Position: Sitting, Cuff Size: Normal)   Pulse 66   Ht 5\' 5"  (1.651 m)   Wt 224 lb (101.6 kg)   BMI 37.28 kg/m    Wt Readings from Last 3 Encounters:  09/14/23 224 lb (101.6 kg)  08/13/23 227 lb 9.6 oz (103.2 kg)  08/06/23 228 lb (103.4 kg)    Sounds well.  She says her home blood pressure readings have not usually been as good as this 1 was.  On average mostly in the 140s/80s.    ASSESSMENT AND PLAN: .    Problem List Items Addressed This Visit       Cardiology Problems   Chronic heart failure with preserved ejection fraction (HFpEF) (HCC) - Primary (Chronic)    Most of her symptoms of shortness of breath are related to deconditioning, obesity and immobility.  Her musculoskeletal pains limit her more than anything. Most of her edema is probably related to venous stasis and not CHF => today she reports of increased foot swelling, likely related to dietary sodium intake.  Is on a combination of benazepril-HCTZ and we have recently titrated up her carvedilol to 12 and half milligram twice daily.  She takes very low-dose furosemide and not requiring it frequently.  Maybe a couple times a week.  We did discuss sliding scale. -Advised to reduce sodium intake. Continue to treat peripheral neuropathy.  Also probably has some venous stasis changes.  When able to, suggest support hose.      Relevant Medications   carvedilol (COREG) 12.5 MG tablet   Chronic venous stasis dermatitis  of both lower extremities (Chronic)    Overall improved.  Continue current regimen which clearly includes foot elevation and compression stockings.  Continue her pulse-wave therapy for neuropathy.  Continue balance PT.      Relevant Medications   carvedilol (COREG) 12.5 MG tablet   Essential hypertension (Chronic)    Blood pressure readings averaging 140/80, despite being on carvedilol, benazepril, and HCTZ. Discussed the impact of lifestyle factors such as salt intake and alcohol consumption on blood pressure control. -Increase carvedilol (12.5 mg) to 1.5 tablets (18.75 mg) twice daily (increased p.m. dose first and then increase to twice daily after a week if well-tolerated) -Encouraged reduction in sodium intake and alcohol consumption.      Relevant Medications   carvedilol (COREG) 12.5 MG tablet          Follow-up-up plans: No follow-ups on file. -See PA or NP in March 2025. -See physician in September 2025.  Total time spent: 10 min spent with patient + 11 min spent charting = 21 min      Signed, Marykay Lex, MD, MS Bryan Lemma, M.D., M.S. Interventional Cardiologist  [facility]  Pager # [phone] Phone # [phone] [address] [place], [place] [address]

## 2023-09-14 NOTE — Assessment & Plan Note (Signed)
Overall improved.  Continue current regimen which clearly includes foot elevation and compression stockings.  Continue her pulse-wave therapy for neuropathy.  Continue balance PT.

## 2023-09-14 NOTE — Assessment & Plan Note (Addendum)
Most of her symptoms of shortness of breath are related to deconditioning, obesity and immobility.  Her musculoskeletal pains limit her more than anything. Most of her edema is probably related to venous stasis and not CHF => today she reports of increased foot swelling, likely related to dietary sodium intake.  Is on a combination of benazepril-HCTZ and we have recently titrated up her carvedilol to 12 and half milligram twice daily.  She takes very low-dose furosemide and not requiring it frequently.  Maybe a couple times a week.  We did discuss sliding scale. -Advised to reduce sodium intake. Continue to treat peripheral neuropathy.  Also probably has some venous stasis changes.  When able to, suggest support hose.

## 2023-09-14 NOTE — Telephone Encounter (Signed)
Pt c/o medication issue:  1. Name of Medication: carvedilol (COREG) 12.5 MG tablet   2. How are you currently taking this medication (dosage and times per day)?   3. Are you having a reaction (difficulty breathing--STAT)?   4. What is your medication issue? [facility] is calling to get clarification on medication instructions sent in with refill. Please advise.

## 2023-09-14 NOTE — Patient Instructions (Addendum)
Medication Instructions:  Carvedilol (COREG) 12.5MG  tablet  Take 1 tablet in morning(12.5mg )  and 1/2 tablet in evening (6.25mg ). (18.75mg  TOTAL DAILY) for 1 week.  *If you need a refill on your cardiac medications before your next appointment, please call your pharmacy*   Lab Work: No changes    If you have labs (blood work) drawn today and your tests are completely normal, you will receive your results only by: MyChart Message (if you have MyChart) OR A paper copy in the mail If you have any lab test that is abnormal or we need to change your treatment, we will call you to review the results.   Testing/Procedures: None    Follow-Up: At [facility], you and your health needs are our priority.  As part of our continuing mission to provide you with exceptional heart care, we have created designated Provider Care Teams.  These Care Teams include your primary Cardiologist (physician) and Advanced Practice Providers (APPs -  Physician Assistants and Nurse Practitioners) who all work together to provide you with the care you need, when you need it.  We recommend signing up for the patient portal called "MyChart".  Sign up information is provided on this After Visit Summary.  MyChart is used to connect with patients for Virtual Visits (Telemedicine).  Patients are able to view lab/test results, encounter notes, upcoming appointments, etc.  Non-urgent messages can be sent to your provider as well.   To learn more about what you can do with MyChart, go to [email or web address].    Your next appointment:   6 month(s)  The format for your next appointment:   In Person  Provider:   Marjie Skiff, PA-C or Bernadene Person, NP    Then, Bryan Lemma, MD will plan to see you again in 1 year(s).

## 2023-09-14 NOTE — Assessment & Plan Note (Signed)
Blood pressure readings averaging 140/80, despite being on carvedilol, benazepril, and HCTZ. Discussed the impact of lifestyle factors such as salt intake and alcohol consumption on blood pressure control. -Increase carvedilol (12.5 mg) to 1.5 tablets (18.75 mg) twice daily (increased p.m. dose first and then increase to twice daily after a week if well-tolerated) -Encouraged reduction in sodium intake and alcohol consumption.

## 2023-09-14 NOTE — Progress Notes (Deleted)
PCP:  Melida Quitter, PA  Cardiologist:  Bryan Lemma, MD *** Electrophysiologist:  None   Chief Complaint:   No chief complaint on file.   ====================================  ASSESSMENT & PLAN:    Problem List Items Addressed This Visit   None   ====================================  History of Present Illness:    [name] is a 77 y.o. female with PMH notable for *** who presents via audio/video conferencing for a telehealth visit today as a ***.  [name] was last seen ***  Hospitalizations:  ***   Recent - Interim CV studies:   The following studies were reviewed today: ***:  Inerval History   ***  Cardiovascular ROS: {roscv:310661}   ROS:  Please see the history of present illness.     ROS  Past Medical History:  Diagnosis Date   Alcohol abuse 04/25/2015   reportedly drinks ~ 1 bottle of wine / day   Allergy    Anastomotic ulcer S/P gastric bypass 06/18/2016   Aortic atherosclerosis (HCC)    Arthritis    Cataract    Chronic diastolic heart failure (HCC) 10/25/2017   Degenerative disc disease, cervical    DVT, bilateral lower limbs (HCC) 05/2020   Age-indeterminate bilateral PERONEAL VEIN DVTs --> (was postop from acute cord compression, started on Xarelto Dosepak   Essential hypertension 04/25/2015   GAD (generalized anxiety disorder) 06/14/2016   HNP (herniated nucleus pulposus) with myelopathy, cervical 05/27/2020   Cervical Myelopathy with Spinal cord compression (HCC)  --> s/p Anterior Cervical Discectomy Decompression)    Hypothyroidism    IDA (iron deficiency anemia)    Kidney damage    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   UGIB (upper gastrointestinal bleed) 04/25/2015   Venous stasis of both lower extremities 01/10/2019   Past Surgical History:  Procedure Laterality Date   ABDOMINAL HYSTERECTOMY     ANTERIOR CERVICAL DECOMP/DISCECTOMY FUSION Left 05/26/2020   Procedure:  Cervical seven - Thoracic one ANTERIOR CERVICAL DISCECTOMY AND PLATING;  Surgeon: Tressie Stalker, MD;  Location: [facility] OR;  Service: Neurosurgery;  Laterality: Left;   CHOLECYSTECTOMY     COSMETIC SURGERY     extra fat taken off arms and eyelid lifted   ESOPHAGOGASTRODUODENOSCOPY N/A 04/25/2015   Procedure: ESOPHAGOGASTRODUODENOSCOPY (EGD);  Surgeon: Beverley Fiedler, MD;  Location: [facility] ENDOSCOPY;  Service: Endoscopy;  Laterality: N/A;   FRACTURE SURGERY     GASTRIC BYPASS  2003   gastric ulcer repair  04/25/2015   NM MYOVIEW LTD  10/10/2017   Normal LV size and function EF 72%.  Medium sized mild severity defect in the apical septal apical lateral and apical wall suggestive of breast attenuation.  LOW RISK.  No ischemia or infarction.     OPEN REDUCTION INTERNAL FIXATION (ORIF) DISTAL RADIAL FRACTURE Right 01/15/2013   Procedure: OPEN REDUCTION INTERNAL FIXATION (ORIF) Right DISTAL RADIUS FRACTURE;  Surgeon: Eldred Manges, MD;  Location: [facility] OR;  Service: Orthopedics;  Laterality: Right;   SPINE SURGERY     TRANSTHORACIC ECHOCARDIOGRAM  10/04/2017   Mild LVH.  EF 60-65%.  Grade 2/moderate diastolic dysfunction.  Mild pulmonary hypertension.  No valve lesions   VENTRAL HERNIA REPAIR N/A 12/02/2020   Procedure: HERNIA REPAIR INCISIONAL  ADULT, open;  Surgeon: Leafy Ro, MD;  Location: [facility] ORS;  Service: General;  Laterality: N/A;     Current Meds  Medication Sig   acetaminophen (TYLENOL) 500 MG tablet Take 500 mg by mouth  every 6 (six) hours as needed.   benazepril-hydrochlorthiazide (LOTENSIN HCT) 20-25 MG tablet TAKE 1 TABLET BY MOUTH DAILY.   carvedilol (COREG) 12.5 MG tablet Take 1 tab in morning(12.5mg )  and 1/2 in evening (6.25mg ). (18.75mg  TOTAL DAILY) for 1 week.   Cholecalciferol (VITAMIN D3) 125 MCG (5000 UT) TABS Take 3 tablets (15,000 Units total) by mouth See admin instructions. Takes 16109 units 5 days a week and 20000 units on 2 days. (Patient taking differently: Take 15,000 Units by  mouth See admin instructions. Takes 15,000 units 5 days a week and 20,000 units on 2 days.)   Cyanocobalamin (VITAMIN B-12) 2500 MCG SUBL Place 2,500 mcg under the tongue once a week.   cyclobenzaprine (FLEXERIL) 10 MG tablet TAKE 1 TABLET BY MOUTH DAILY AS NEEDED FOR MUSCLE SPASMS.   diphenhydrAMINE (BENADRYL) 25 MG tablet Take 25 mg by mouth daily as needed (Allergies).   ferrous sulfate 325 (65 FE) MG tablet Take 1 tablet (325 mg total) by mouth daily with breakfast.   furosemide (LASIX) 20 MG tablet Take 1 tablet (20 mg total) by mouth as needed. Take and additional if needed for fluid   gabapentin (NEURONTIN) 300 MG capsule TAKE 4 CAPSULES BY MOUTH EVERY MORNING, 3 CAPSULES IN THE AFTERNOON, AND 4 CAPSULES AT NIGHT   levothyroxine (SYNTHROID) 125 MCG tablet TAKE 1 TABLET (125 MCG TOTAL) BY MOUTH ONCE DAILY EVERY MORNING BEFORE BREAKFAST   LORazepam (ATIVAN) 2 MG tablet 1 tablet 2 hours prior to flying   meloxicam (MOBIC) 15 MG tablet TAKE 1 TABLET BY MOUTH DAILY   pantoprazole (PROTONIX) 40 MG tablet TAKE 1 TABLET (40 MG TOTAL) BY MOUTH DAILY.   predniSONE (DELTASONE) 50 MG tablet One tab PO daily for 5 days.   rosuvastatin (CRESTOR) 5 MG tablet TAKE 1 TABLET (5 MG TOTAL) BY MOUTH DAILY.   traMADol (ULTRAM) 50 MG tablet Take 1-2 tablets (50-100 mg total) by mouth 3 (three) times daily as needed.   traZODone (DESYREL) 50 MG tablet Take 0.5-1 tablets (25-50 mg total) by mouth at bedtime as needed. for sleep   [DISCONTINUED] carvedilol (COREG) 12.5 MG tablet Take 1 tablet (12.5 mg total) by mouth 2 (two) times daily.     Allergies:   Cocoa, Red dye #40 (allura red), Lyrica [pregabalin], Other, and Sulfa antibiotics   Social History   Tobacco Use   Smoking status: Never    Passive exposure: Never   Smokeless tobacco: Never  Vaping Use   Vaping status: Never Used  Substance Use Topics   Alcohol use: Yes    Alcohol/week: 28.0 standard drinks of alcohol    Types: 28 Glasses of wine per  week    Comment: drinks daily and has a bottle and half of wine per day   Drug use: No     Family Hx: The patient's family history includes Breast cancer in her sister; Breast cancer (age of onset: 28) in her mother; COPD in her father; Cancer in her father and mother; Cancer (age of onset: 77) in her cousin; Diabetes in her father; Heart disease in her mother; Hypertension in her father and mother; Mental illness in her maternal grandmother; Stroke in her maternal grandmother; Stroke (age of onset: 46) in her mother.   Labs/Other Tests and Data Reviewed:    EKG:  {UEA:[id number]}  Recent Labs: 08/20/2023: ALT 19; BUN 19; Creatinine, Ser 0.80; Hemoglobin 14.0; Platelets 235; Potassium 4.6; Sodium 135; TSH 0.750   Recent Lipid Panel Lab Results  Component  Value Date/Time   CHOL 165 08/20/2023 10:48 AM   TRIG 89 08/20/2023 10:48 AM   HDL 70 08/20/2023 10:48 AM   CHOLHDL 2.4 08/20/2023 10:48 AM   CHOLHDL 3.5 08/15/2016 09:12 AM   LDLCALC 79 08/20/2023 10:48 AM   LDLDIRECT 74 08/18/2021 03:09 PM    Wt Readings from Last 3 Encounters:  09/14/23 224 lb (101.6 kg)  08/13/23 227 lb 9.6 oz (103.2 kg)  08/06/23 228 lb (103.4 kg)     Objective:    Vital Signs:  BP 130/72 (BP Location: Right Arm, Patient Position: Sitting, Cuff Size: Normal)   Pulse 66   Ht 5\' 5"  (1.651 m)   Wt 224 lb (101.6 kg)   BMI 37.28 kg/m   {[facility] Virtual Exam (Optional):[phone]::"VITAL SIGNS:  reviewed"}   ==========================================  COVID-19 Education: The signs and symptoms of COVID-19 were discussed with the patient and how to seek care for testing (follow up with PCP or arrange E-visit).   The importance of social distancing was discussed today.  Time:   Today, I have spent *** minutes with the patient with telehealth technology discussing the above problems.   An additional ***minutes spent charting (reviewing prior notes, hospital records, studies, labs etc.) Total  ***minutes   Medication Adjustments/Labs and Tests Ordered: Current medicines are reviewed at length with the patient today.  Concerns regarding medicines are outlined above.   Patient Instructions  Medication Instructions:  Carvedilol (COREG) 12.5MG  tablet  Take 1 tablet in morning(12.5mg )  and 1/2 tablet in evening (6.25mg ). (18.75mg  TOTAL DAILY) for 1 week.  *If you need a refill on your cardiac medications before your next appointment, please call your pharmacy*   Lab Work: No changes    If you have labs (blood work) drawn today and your tests are completely normal, you will receive your results only by: MyChart Message (if you have MyChart) OR A paper copy in the mail If you have any lab test that is abnormal or we need to change your treatment, we will call you to review the results.   Testing/Procedures: None    Follow-Up: At [facility], you and your health needs are our priority.  As part of our continuing mission to provide you with exceptional heart care, we have created designated Provider Care Teams.  These Care Teams include your primary Cardiologist (physician) and Advanced Practice Providers (APPs -  Physician Assistants and Nurse Practitioners) who all work together to provide you with the care you need, when you need it.  We recommend signing up for the patient portal called "MyChart".  Sign up information is provided on this After Visit Summary.  MyChart is used to connect with patients for Virtual Visits (Telemedicine).  Patients are able to view lab/test results, encounter notes, upcoming appointments, etc.  Non-urgent messages can be sent to your provider as well.   To learn more about what you can do with MyChart, go to [email or web address].    Your next appointment:   6 month(s)  The format for your next appointment:   In Person  Provider:   Marjie Skiff, PA-C or Bernadene Person, NP    Then, Bryan Lemma, MD will plan to see you again in 1  year(s).       Signed, Bryan Lemma, MD  09/14/2023 1:51 PM    [facility]

## 2023-09-14 NOTE — Telephone Encounter (Signed)
Pharmacy is calling to get clarification on Carvedilol 12.5 mg. They want to know what if any dose will patient be on after the one week. Please advise.

## 2023-09-15 NOTE — Telephone Encounter (Signed)
She is taking 12.5 mg twice daily right now.  For the 1 week she is to take 1-1/2 tablets at nighttime and 1 tablet in the morning after that she will be going up to 18.75 mg (1-1/2 tablets) twice daily.  Therefore we just want to write the prescription as carvedilol 12.5 mg tablets-take 1-1/2 tablet twice daily as directed. Dispense enough for 90-day supply with 3 refills.  Bryan Lemma, MD

## 2023-09-17 ENCOUNTER — Other Ambulatory Visit: Payer: Self-pay | Admitting: Family Medicine

## 2023-09-17 DIAGNOSIS — Z1231 Encounter for screening mammogram for malignant neoplasm of breast: Secondary | ICD-10-CM

## 2023-09-17 NOTE — Telephone Encounter (Signed)
Called spoke to Casimiro Needle , pharmacist - verbal order give  Clarification per Dr Herbie Baltimore   The first week  take 12.5 mg in the morning and 18.75  evening  for 7 days,then increase to 18.75 mg  twice a day.  Pharmacist  verbalized understanding 90 day  supply.

## 2023-09-27 ENCOUNTER — Other Ambulatory Visit: Payer: Self-pay | Admitting: Family Medicine

## 2023-09-27 DIAGNOSIS — E039 Hypothyroidism, unspecified: Secondary | ICD-10-CM

## 2023-10-01 ENCOUNTER — Encounter: Payer: Self-pay | Admitting: Sports Medicine

## 2023-10-01 ENCOUNTER — Other Ambulatory Visit: Payer: Self-pay | Admitting: Family Medicine

## 2023-10-01 ENCOUNTER — Ambulatory Visit (INDEPENDENT_AMBULATORY_CARE_PROVIDER_SITE_OTHER): Payer: PPO | Admitting: Sports Medicine

## 2023-10-01 ENCOUNTER — Ambulatory Visit: Payer: PPO

## 2023-10-01 DIAGNOSIS — M79674 Pain in right toe(s): Secondary | ICD-10-CM | POA: Diagnosis not present

## 2023-10-01 DIAGNOSIS — K219 Gastro-esophageal reflux disease without esophagitis: Secondary | ICD-10-CM

## 2023-10-01 NOTE — Assessment & Plan Note (Signed)
Stubbed right second toe about a week or 2 ago, had immediate bruising, pain, swelling, inability to dorsiflex the toe. The swelling improved to some degree, she still has some discomfort but current problem is the inability to dorsiflex the second toe, her second toe is a very long, it is approximately a centimeter longer than her great toe. As she can no longer dorsiflex that she is tripping over it. She has 0/5 strength to extension at the DIP, I do have a suspicion for a mallet type injury. We will get x-rays, MRI to evaluate the extensor apparatus and I would like consultation with podiatry.

## 2023-10-01 NOTE — Progress Notes (Signed)
    Procedures performed today:    None.  Independent interpretation of notes and tests performed by another provider:   None.  Brief History, Exam, Impression, and Recommendations:    Toe pain, right Stubbed right second toe about a week or 2 ago, had immediate bruising, pain, swelling, inability to dorsiflex the toe. The swelling improved to some degree, she still has some discomfort but current problem is the inability to dorsiflex the second toe, her second toe is a very long, it is approximately a centimeter longer than her great toe. As she can no longer dorsiflex that she is tripping over it. She has 0/5 strength to extension at the DIP, I do have a suspicion for a mallet type injury. We will get x-rays, MRI to evaluate the extensor apparatus and I would like consultation with podiatry.    ____________________________________________ Ihor Austin. Benjamin Stain, M.D., ABFM., CAQSM., AME. Primary Care and Sports Medicine [facility]  Adjunct Professor of Family Medicine  [place] [employer]  Restaurant manager, fast food

## 2023-10-07 ENCOUNTER — Ambulatory Visit: Payer: PPO

## 2023-10-07 DIAGNOSIS — M79674 Pain in right toe(s): Secondary | ICD-10-CM | POA: Diagnosis not present

## 2023-10-07 DIAGNOSIS — M19071 Primary osteoarthritis, right ankle and foot: Secondary | ICD-10-CM | POA: Diagnosis not present

## 2023-10-09 ENCOUNTER — Ambulatory Visit
Admission: RE | Admit: 2023-10-09 | Discharge: 2023-10-09 | Disposition: A | Discharge status: Home / Self Care | Payer: PPO | Source: Ambulatory Visit

## 2023-10-09 DIAGNOSIS — Z1231 Encounter for screening mammogram for malignant neoplasm of breast: Secondary | ICD-10-CM

## 2023-10-16 ENCOUNTER — Ambulatory Visit (INDEPENDENT_AMBULATORY_CARE_PROVIDER_SITE_OTHER): Payer: PPO

## 2023-10-16 ENCOUNTER — Other Ambulatory Visit: Payer: Self-pay | Admitting: Family Medicine

## 2023-10-16 DIAGNOSIS — Z0000 Encounter for general adult medical examination without abnormal findings: Secondary | ICD-10-CM

## 2023-10-16 DIAGNOSIS — G8929 Other chronic pain: Secondary | ICD-10-CM

## 2023-10-16 NOTE — Progress Notes (Signed)
Subjective:   [name] is a 77 y.o. female who presents for Medicare Annual (Subsequent) preventive examination.  Visit Complete: Virtual I connected with  [name] on 10/16/23 by a audio enabled telemedicine application and verified that I am speaking with the correct person using two identifiers.  Patient Location: Home  Provider Location: Office/Clinic  I discussed the limitations of evaluation and management by telemedicine. The patient expressed understanding and agreed to proceed.  Vital Signs: Because this visit was a virtual/telehealth visit, some criteria may be missing or patient reported. Any vitals not documented were not able to be obtained and vitals that have been documented are patient reported.    Cardiac Risk Factors include: advanced age (>62men, >38 women);dyslipidemia;hypertension     Objective:    Today's Vitals   There is no height or weight on file to calculate BMI.     10/16/2023    2:39 PM 11/25/2020   11:32 AM 05/29/2020    4:19 PM 05/29/2020    3:45 PM 05/25/2020    4:00 PM 12/04/2016    1:45 PM 06/14/2016    1:44 PM  Advanced Directives  Does Patient Have a Medical Advance Directive? No No No No No No No  Would patient like information on creating a medical advance directive?  No - Patient declined No - Patient declined  No - Patient declined -- No - patient declined information    Current Medications (verified) Outpatient Encounter Medications as of 10/16/2023  Medication Sig   acetaminophen (TYLENOL) 500 MG tablet Take 500 mg by mouth every 6 (six) hours as needed.   benazepril-hydrochlorthiazide (LOTENSIN HCT) 20-25 MG tablet TAKE 1 TABLET BY MOUTH DAILY.   carvedilol (COREG) 12.5 MG tablet Take 1 tab in morning(12.5mg )  and 1/2 in evening (6.25mg ). (18.75mg  TOTAL DAILY) for 1 week.   Cholecalciferol (VITAMIN D3) 125 MCG (5000 UT) TABS Take 3 tablets (15,000 Units total) by mouth See admin instructions. Takes 01027 units  5 days a week and 20000 units on 2 days. (Patient taking differently: Take 15,000 Units by mouth See admin instructions. Takes 15,000 units 5 days a week and 20,000 units on 2 days.)   Cyanocobalamin (VITAMIN B-12) 2500 MCG SUBL Place 2,500 mcg under the tongue once a week.   cyclobenzaprine (FLEXERIL) 10 MG tablet TAKE 1 TABLET BY MOUTH DAILY AS NEEDED FOR MUSCLE SPASMS.   diphenhydrAMINE (BENADRYL) 25 MG tablet Take 25 mg by mouth daily as needed (Allergies).   ferrous sulfate 325 (65 FE) MG tablet Take 1 tablet (325 mg total) by mouth daily with breakfast.   furosemide (LASIX) 20 MG tablet Take 1 tablet (20 mg total) by mouth as needed. Take and additional if needed for fluid   gabapentin (NEURONTIN) 300 MG capsule TAKE 4 CAPSULES BY MOUTH EVERY MORNING, 3 CAPSULES IN THE AFTERNOON, AND 4 CAPSULES AT NIGHT   levothyroxine (SYNTHROID) 125 MCG tablet TAKE 1 TABLET (125 MCG TOTAL) BY MOUTH ONCE DAILY EVERY MORNING BEFORE BREAKFAST   meloxicam (MOBIC) 15 MG tablet TAKE 1 TABLET BY MOUTH DAILY   pantoprazole (PROTONIX) 40 MG tablet TAKE 1 TABLET (40 MG TOTAL) BY MOUTH DAILY.   rosuvastatin (CRESTOR) 5 MG tablet TAKE 1 TABLET (5 MG TOTAL) BY MOUTH DAILY.   traMADol (ULTRAM) 50 MG tablet Take 1-2 tablets (50-100 mg total) by mouth 3 (three) times daily as needed.   traZODone (DESYREL) 50 MG tablet Take 0.5-1 tablets (25-50 mg total) by mouth at bedtime as needed.  for sleep   LORazepam (ATIVAN) 2 MG tablet 1 tablet 2 hours prior to flying (Patient not taking: Reported on 10/16/2023)   predniSONE (DELTASONE) 50 MG tablet One tab PO daily for 5 days. (Patient not taking: Reported on 10/16/2023)   No facility-administered encounter medications on file as of 10/16/2023.    Allergies (verified) Cocoa, Red dye #40 (allura red), Lyrica [pregabalin], Other, and Sulfa antibiotics   History: Past Medical History:  Diagnosis Date   Alcohol abuse 04/25/2015   reportedly drinks ~ 1 bottle of wine / day    Allergy    Anastomotic ulcer S/P gastric bypass 06/18/2016   Aortic atherosclerosis (HCC)    Arthritis    Cataract    Chronic diastolic heart failure (HCC) 10/25/2017   Degenerative disc disease, cervical    DVT, bilateral lower limbs (HCC) 05/2020   Age-indeterminate bilateral PERONEAL VEIN DVTs --> (was postop from acute cord compression, started on Xarelto Dosepak   Essential hypertension 04/25/2015   GAD (generalized anxiety disorder) 06/14/2016   HNP (herniated nucleus pulposus) with myelopathy, cervical 05/27/2020   Cervical Myelopathy with Spinal cord compression (HCC)  --> s/p Anterior Cervical Discectomy Decompression)    Hypothyroidism    IDA (iron deficiency anemia)    Kidney damage    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   UGIB (upper gastrointestinal bleed) 04/25/2015   Venous stasis of both lower extremities 01/10/2019   Past Surgical History:  Procedure Laterality Date   ABDOMINAL HYSTERECTOMY     ANTERIOR CERVICAL DECOMP/DISCECTOMY FUSION Left 05/26/2020   Procedure: Cervical seven - Thoracic one ANTERIOR CERVICAL DISCECTOMY AND PLATING;  Surgeon: Tressie Stalker, MD;  Location: [facility] OR;  Service: Neurosurgery;  Laterality: Left;   CATARACT EXTRACTION Bilateral    June and July 2024   CHOLECYSTECTOMY     COSMETIC SURGERY     extra fat taken off arms and eyelid lifted   ESOPHAGOGASTRODUODENOSCOPY N/A 04/25/2015   Procedure: ESOPHAGOGASTRODUODENOSCOPY (EGD);  Surgeon: Beverley Fiedler, MD;  Location: [facility] ENDOSCOPY;  Service: Endoscopy;  Laterality: N/A;   FRACTURE SURGERY     GASTRIC BYPASS  2003   gastric ulcer repair  04/25/2015   NM MYOVIEW LTD  10/10/2017   Normal LV size and function EF 72%.  Medium sized mild severity defect in the apical septal apical lateral and apical wall suggestive of breast attenuation.  LOW RISK.  No ischemia or infarction.     OPEN REDUCTION INTERNAL FIXATION (ORIF) DISTAL RADIAL FRACTURE Right 01/15/2013    Procedure: OPEN REDUCTION INTERNAL FIXATION (ORIF) Right DISTAL RADIUS FRACTURE;  Surgeon: Eldred Manges, MD;  Location: [facility] OR;  Service: Orthopedics;  Laterality: Right;   SPINE SURGERY     TRANSTHORACIC ECHOCARDIOGRAM  10/04/2017   Mild LVH.  EF 60-65%.  Grade 2/moderate diastolic dysfunction.  Mild pulmonary hypertension.  No valve lesions   VENTRAL HERNIA REPAIR N/A 12/02/2020   Procedure: HERNIA REPAIR INCISIONAL  ADULT, open;  Surgeon: Leafy Ro, MD;  Location: [facility] ORS;  Service: General;  Laterality: N/A;   Family History  Problem Relation Age of Onset   Heart disease Mother    Stroke Mother 13   Cancer Mother        skin and breast   Hypertension Mother    Breast cancer Mother 12   COPD Father    Cancer Father    Diabetes Father    Hypertension Father    Stroke Maternal Grandmother    Mental illness  Maternal Grandmother    Breast cancer Sister    Cancer Cousin 29   Social History   Socioeconomic History   Marital status: Married    Spouse name: Not on file   Number of children: 4   Years of education: Not on file   Highest education level: Not on file  Occupational History   Occupation: RN    Comment: ICU Sales executive  Tobacco Use   Smoking status: Never    Passive exposure: Never   Smokeless tobacco: Never  Vaping Use   Vaping status: Never Used  Substance and Sexual Activity   Alcohol use: Yes    Alcohol/week: 28.0 standard drinks of alcohol    Types: 28 Glasses of wine per week    Comment: drinks daily and has a bottle and half of wine per day   Drug use: No   Sexual activity: Yes    Birth control/protection: Post-menopausal  Other Topics Concern   Not on file  Social History Narrative   Marital status: married      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      Alcohol: drinks bottle of wine daily in 2016   Takes care of her 33 y/o mom at home   Social Determinants of Health   Financial Resource Strain: Low Risk  (10/16/2023)   Overall  Financial Resource Strain (CARDIA)    Difficulty of Paying Living Expenses: Not hard at all  Food Insecurity: No Food Insecurity (10/16/2023)   Hunger Vital Sign    Worried About Running Out of Food in the Last Year: Never true    Ran Out of Food in the Last Year: Never true  Transportation Needs: No Transportation Needs (10/16/2023)   PRAPARE - Administrator, Civil Service (Medical): No    Lack of Transportation (Non-Medical): No  Physical Activity: Inactive (10/16/2023)   Exercise Vital Sign    Days of Exercise per Week: 0 days    Minutes of Exercise per Session: 0 min  Stress: No Stress Concern Present (10/16/2023)   [employer] of Occupational Health - Occupational Stress Questionnaire    Feeling of Stress : Not at all  Social Connections: Moderately Isolated (10/16/2023)   Social Connection and Isolation Panel [NHANES]    Frequency of Communication with Friends and Family: More than three times a week    Frequency of Social Gatherings with Friends and Family: Not on file    Attends Religious Services: Never    Database administrator or Organizations: No    Attends Engineer, structural: Never    Marital Status: Married    Tobacco Counseling Counseling given: Not Answered   Clinical Intake:  Pre-visit preparation completed: Yes  Pain : No/denies pain     Nutritional Risks: Nausea/ vomitting/ diarrhea (couple weeks ago had 24 hour bug) Diabetes: No  How often do you need to have someone help you when you read instructions, pamphlets, or other written materials from your doctor or pharmacy?: 1 - Never  Interpreter Needed?: No  Information entered by :: NAllen LPN   Activities of Daily Living    10/16/2023    2:27 PM  In your present state of health, do you have any difficulty performing the following activities:  Hearing? 1  Comment decreased hearing, no hearing aids  Vision? 0  Difficulty concentrating or making decisions? 0   Walking or climbing stairs? 1  Dressing or bathing? 1  Doing errands, shopping? 1  Comment does not drive, husband accompanies  Quarry manager and eating ? N  Using the Toilet? N  In the past six months, have you accidently leaked urine? Y  Comment incontinence  Do you have problems with loss of bowel control? N  Managing your Medications? N  Managing your Finances? N  Housekeeping or managing your Housekeeping? N    Patient Care Team: Melida Quitter, PA as PCP - General (Family Medicine) Marykay Lex, MD as PCP - Cardiology (Cardiology) Monica Becton, MD as Consulting Physician (Sports Medicine) Jerolyn Shin, MD as Consulting Physician (Neurology) Marykay Lex, MD as Consulting Physician (Cardiology) Jeani Hawking, MD as Consulting Physician (Gastroenterology) Conley Rolls, My [place], [place] as Referring Physician (Optometry) Maxwell Caul, MD as Consulting Physician (Internal Medicine)  Indicate any recent Medical Services you may have received from other than [facility] providers in the past year (date may be approximate).     Assessment:   This is a routine wellness examination for Mirenda.  Hearing/Vision screen Hearing Screening - Comments:: Denies hearing issues Vision Screening - Comments:: Regular eye exams, Dr. Benjamine Mola   Goals Addressed             This Visit's Progress    Patient Stated       10/16/2023, working on balance and lose weight wants to get down to 215 pounds       Depression Screen    10/16/2023    2:42 PM 02/07/2023    3:38 PM 10/09/2022    4:13 PM 05/16/2022   10:48 AM 11/29/2021   11:44 AM 08/18/2021    2:37 PM 04/26/2021    1:06 PM  PHQ 2/9 Scores  PHQ - 2 Score 0 0 0 0 0 0 0  PHQ- 9 Score   5 5 6 5 7     Fall Risk    10/16/2023    2:40 PM 02/07/2023    3:38 PM 10/09/2022    4:14 PM 05/16/2022   10:42 AM 11/29/2021   11:44 AM  Fall Risk   Falls in the past year? 0 1 1 1 1   Number falls in past yr: 0 1 1 1 1   Injury with Fall? 0  1 0 1 1  Risk for fall due to : Medication side effect;Impaired mobility;Impaired balance/gait Impaired balance/gait;Impaired mobility;History of fall(s)  Impaired balance/gait History of fall(s);Impaired balance/gait;Impaired mobility  Follow up Falls prevention discussed;Falls evaluation completed   Falls evaluation completed;Education provided Falls evaluation completed    MEDICARE RISK AT HOME: Medicare Risk at Home Any stairs in or around the home?: Yes (has chair lift) If so, are there any without handrails?: No Home free of loose throw rugs in walkways, pet beds, electrical cords, etc?: Yes Adequate lighting in your home to reduce risk of falls?: Yes Life alert?: No Use of a cane, walker or w/c?: Yes Grab bars in the bathroom?: Yes Shower chair or bench in shower?: No Elevated toilet seat or a handicapped toilet?: No  TIMED UP AND GO:  Was the test performed?  No    Cognitive Function:        10/16/2023    2:43 PM 11/29/2021   11:47 AM 01/13/2019   11:41 AM  6CIT Screen  What Year? 0 points 0 points 0 points  What month? 0 points 0 points 0 points  What time? 0 points 0 points 0 points  Count back from 20 0 points 0 points 0 points  Months in reverse 0  points 0 points 0 points  Repeat phrase 2 points 0 points 0 points  Total Score 2 points 0 points 0 points    Immunizations Immunization History  Administered Date(s) Administered   Fluad Quad(high Dose 65+) 09/03/2019, 11/16/2020, 10/09/2022   Fluad Trivalent(High Dose 65+) 08/06/2023   Influenza, High Dose Seasonal PF 09/25/2017, 08/09/2021   Influenza,inj,Quad PF,6+ Mos 01/10/2017   Moderna Covid-19 Vaccine Bivalent Booster 57yrs & up 08/09/2021   Moderna Sars-Covid-2 Vaccination 08/09/2021   PFIZER(Purple Top)SARS-COV-2 Vaccination 01/10/2020, 02/04/2020, 09/01/2020, 02/26/2021   Pfizer(Comirnaty)Fall Seasonal Vaccine 12 years and older 03/10/2023   RSV,unspecified 11/18/2022   Td 11/27/2013   Tdap 12/05/2018    Unspecified SARS-COV-2 Vaccination 09/01/2022   Zoster Recombinant(Shingrix) 12/29/2020, 08/18/2021    TDAP status: Up to date  Flu Vaccine status: Up to date  Pneumococcal vaccine status: Due, Education has been provided regarding the importance of this vaccine. Advised may receive this vaccine at local pharmacy or Health Dept. Aware to provide a copy of the vaccination record if obtained from local pharmacy or Health Dept. Verbalized acceptance and understanding.  Covid-19 vaccine status: Completed vaccines  Qualifies for Shingles Vaccine? Yes   Zostavax completed Yes   Shingrix Completed?: Yes  Screening Tests Health Maintenance  Topic Date Due   Pneumonia Vaccine 57+ Years old (1 of 1 - PCV) Never done   Medicare Annual Wellness (AWV)  10/15/2024   DTaP/Tdap/Td (3 - Td or Tdap) 12/05/2028   INFLUENZA VACCINE  Completed   DEXA SCAN  Completed   COVID-19 Vaccine  Completed   Hepatitis C Screening  Completed   Zoster Vaccines- Shingrix  Completed   HPV VACCINES  Aged Out   Colonoscopy  Discontinued    Health Maintenance  Health Maintenance Due  Topic Date Due   Pneumonia Vaccine 44+ Years old (1 of 1 - PCV) Never done    Colorectal cancer screening: No longer required.   Mammogram status: Completed 10/09/2023. Repeat every year  Bone Density status: Completed 10/01/2017.   Lung Cancer Screening: (Low Dose CT Chest recommended if Age 28-80 years, 20 pack-year currently smoking OR have quit w/in 15years.) does not qualify.   Lung Cancer Screening Referral: no  Additional Screening:  Hepatitis C Screening: does qualify; Completed 08/20/2023  Vision Screening: Recommended annual ophthalmology exams for early detection of glaucoma and other disorders of the eye. Is the patient up to date with their annual eye exam?  Yes  Who is the provider or what is the name of the office in which the patient attends annual eye exams? Dr. Benjamine Mola If pt is not established with a  provider, would they like to be referred to a provider to establish care? No .   Dental Screening: Recommended annual dental exams for proper oral hygiene  Diabetic Foot Exam: n/a  Community Resource Referral / Chronic Care Management: CRR required this visit?  No   CCM required this visit?  No     Plan:     I have personally reviewed and noted the following in the patient's chart:   Medical and social history Use of alcohol, tobacco or illicit drugs  Current medications and supplements including opioid prescriptions. Patient is not currently taking opioid prescriptions. Functional ability and status Nutritional status Physical activity Advanced directives List of other physicians Hospitalizations, surgeries, and ER visits in previous 12 months Vitals Screenings to include cognitive, depression, and falls Referrals and appointments  In addition, I have reviewed and discussed with patient certain preventive protocols,  quality metrics, and best practice recommendations. A written personalized care plan for preventive services as well as general preventive health recommendations were provided to patient.     Barb Merino, LPN   [id number]   After Visit Summary: (MyChart) Due to this being a telephonic visit, the after visit summary with patients personalized plan was offered to patient via MyChart   Nurse Notes: none

## 2023-10-16 NOTE — Patient Instructions (Signed)
Ms. Boga , Thank you for taking time to come for your Medicare Wellness Visit. I appreciate your ongoing commitment to your health goals. Please review the following plan we discussed and let me know if I can assist you in the future.   Referrals/Orders/Follow-Ups/Clinician Recommendations: none  This is a list of the screening recommended for you and due dates:  Health Maintenance  Topic Date Due   Pneumonia Vaccine (1 of 1 - PCV) Never done   Medicare Annual Wellness Visit  10/15/2024   DTaP/Tdap/Td vaccine (3 - Td or Tdap) 12/05/2028   Flu Shot  Completed   DEXA scan (bone density measurement)  Completed   COVID-19 Vaccine  Completed   Hepatitis C Screening  Completed   Zoster (Shingles) Vaccine  Completed   HPV Vaccine  Aged Out   Colon Cancer Screening  Discontinued    Advanced directives: (Copy Requested) Please bring a copy of your health care power of attorney and living will to the office to be added to your chart at your convenience.  Next Medicare Annual Wellness Visit scheduled for next year: No, schedule not open for next year  Insert Preventive Care attachment Insert FALL PREVENTION attachment if needed

## 2023-10-17 ENCOUNTER — Ambulatory Visit: Payer: PPO | Admitting: Podiatry

## 2023-10-17 ENCOUNTER — Encounter: Payer: Self-pay | Admitting: Podiatry

## 2023-10-17 DIAGNOSIS — M2041 Other hammer toe(s) (acquired), right foot: Secondary | ICD-10-CM | POA: Diagnosis not present

## 2023-10-17 DIAGNOSIS — M792 Neuralgia and neuritis, unspecified: Secondary | ICD-10-CM | POA: Diagnosis not present

## 2023-10-17 DIAGNOSIS — G629 Polyneuropathy, unspecified: Secondary | ICD-10-CM

## 2023-10-17 NOTE — Progress Notes (Signed)
  Subjective:  Patient ID: [name], female    DOB: [birth year],   MRN: [id number]  Chief Complaint  Patient presents with   Toe Pain    Pt for a  stubbed right toe,she injured her toe. She is  having trouble moving the toe up and down.    77 y.o. female presents for concern of stubbed right toe. Referred by Dr Karie Schwalbe. Fayrene Helper two months ago she stubbed her toe although she does not recall any sepcific event. She just started to have swelling on the foot. She has had trouble moving the toe up and down. X-rays and MRI were taken. Still awaiting read on MRI. She relates the real trouble has been walking and feeling imbalance. She does have a history of neuropathy and this is improving with treatments recently.  . Denies any other pedal complaints. Denies n/v/f/c.   Past Medical History:  Diagnosis Date   Alcohol abuse 04/25/2015   reportedly drinks ~ 1 bottle of wine / day   Allergy    Anastomotic ulcer S/P gastric bypass 06/18/2016   Aortic atherosclerosis (HCC)    Arthritis    Cataract    Chronic diastolic heart failure (HCC) 10/25/2017   Degenerative disc disease, cervical    DVT, bilateral lower limbs (HCC) 05/2020   Age-indeterminate bilateral PERONEAL VEIN DVTs --> (was postop from acute cord compression, started on Xarelto Dosepak   Essential hypertension 04/25/2015   GAD (generalized anxiety disorder) 06/14/2016   HNP (herniated nucleus pulposus) with myelopathy, cervical 05/27/2020   Cervical Myelopathy with Spinal cord compression (HCC)  --> s/p Anterior Cervical Discectomy Decompression)    Hypothyroidism    IDA (iron deficiency anemia)    Kidney damage    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 CFT <3 seconds. Normal hair growth on digits. No edema.  Skin. No lacerations  or abrasions bilateral feet.  Musculoskeletal: MMT 5/5 bilateral lower extremities in DF, PF, Inversion and Eversion. Deceased ROM in DF of ankle joint. Gets slight 3/5 movement of the toe DF second digit currently. Elongated second toe. No pain to palpation.  Neurological: Sensation intact to light touch.   Awaiitng MRI read: From my view it appears the extensor tendon to the second digit is intact.   Assessment:   1. Hammertoe of right foot   2. Peripheral neuropathy due to edema b/l feet      Plan:  Patient was evaluated and treated and all questions answered. -Xrays reviewed -Discussed treatement options for toe fracture; risks, alternatives, and benefits explained. -Discussed taping of toe.  -Discussed wearing shoes at all times to protect her feet and prevent falls.  -Continue Rom exercises.  -Recommend protection, rest, ice, elevation daily until symptoms improve -MRI reviewed and does not appear to be any rupture that would require surgical correction.  -Patient to return to office as needed if symptoms don't continue to improve or worsen.    Louann Sjogren, DPM

## 2023-10-22 ENCOUNTER — Other Ambulatory Visit: Payer: Self-pay | Admitting: Family Medicine

## 2023-11-01 ENCOUNTER — Other Ambulatory Visit: Payer: Self-pay | Admitting: Podiatry

## 2023-11-01 ENCOUNTER — Telehealth: Payer: Self-pay | Admitting: Podiatry

## 2023-11-01 MED ORDER — CEPHALEXIN 500 MG PO CAPS: 500.0000 mg | ORAL_CAPSULE | Freq: Three times a day (TID) | ORAL | 0 refills | Status: AC

## 2023-11-01 NOTE — Telephone Encounter (Signed)
Pt called and said she was returning your call about the mri results. Please call her back she will keep the phone close to her so she can answer it .

## 2023-11-01 NOTE — Progress Notes (Signed)
Called patient to discuss MRI results. Discussed possible repair of the tendon however patient more concerned today with infection in her great toe that started recently and in the last few days worsened. Has been soaking and using antibiotic ointment. Sent in oral antibiotic and will have her follow-up about the toenail and further discuss her second toe.

## 2023-11-06 ENCOUNTER — Telehealth: Payer: Self-pay

## 2023-11-06 NOTE — Telephone Encounter (Signed)
Patient called and left message regarding Keflex. She states that she was given a 5 day supply,but she is not due back to see you until the 16th. She would like to know if she needs an extension on the medication?

## 2023-11-06 NOTE — Telephone Encounter (Signed)
Spoke with patient. She verbalized her understanding and stated that her toe has not worsened.

## 2023-11-06 NOTE — Telephone Encounter (Signed)
She should not need a refill unless the toe starts to worsen again then I can send in an extension.

## 2023-11-12 ENCOUNTER — Ambulatory Visit: Payer: PPO | Admitting: Podiatry

## 2023-11-12 ENCOUNTER — Encounter: Payer: Self-pay | Admitting: Podiatry

## 2023-11-12 DIAGNOSIS — L600 Ingrowing nail: Secondary | ICD-10-CM

## 2023-11-12 DIAGNOSIS — G629 Polyneuropathy, unspecified: Secondary | ICD-10-CM | POA: Diagnosis not present

## 2023-11-12 DIAGNOSIS — M2041 Other hammer toe(s) (acquired), right foot: Secondary | ICD-10-CM | POA: Diagnosis not present

## 2023-11-12 NOTE — Progress Notes (Signed)
Subjective:  Patient ID: [name], female    DOB: 07/09/1946,   MRN: [id number]  Chief Complaint  Patient presents with   Toe Pain    Pt presents a follow up of right toe,she injured her toe states she was doing better but now she thinks it's getting worse again.    77 y.o. female presents for fconcern mostly today of right great toe. Relates she did get an ingrown and has been infected. She was given antibiotics and has been taking that. Relates it was doing better but now starting to get a little worse again. Relates she is starting to get a bit more movement in the second toe.  . Denies any other pedal complaints. Denies n/v/f/c.   Past Medical History:  Diagnosis Date   Alcohol abuse 04/25/2015   reportedly drinks ~ 1 bottle of wine / day   Allergy    Anastomotic ulcer S/P gastric bypass 06/18/2016   Aortic atherosclerosis (HCC)    Arthritis    Cataract    Chronic diastolic heart failure (HCC) 10/25/2017   Degenerative disc disease, cervical    DVT, bilateral lower limbs (HCC) 05/2020   Age-indeterminate bilateral PERONEAL VEIN DVTs --> (was postop from acute cord compression, started on Xarelto Dosepak   Essential hypertension 04/25/2015   GAD (generalized anxiety disorder) 06/14/2016   HNP (herniated nucleus pulposus) with myelopathy, cervical 05/27/2020   Cervical Myelopathy with Spinal cord compression (HCC)  --> s/p Anterior Cervical Discectomy Decompression)    Hypothyroidism    IDA (iron deficiency anemia)    Kidney damage    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 CFT <3 seconds. Normal hair growth on digits. No edema.  Skin. No lacerations or abrasions bilateral feet. Incurvation of medial border of right great toenail with mild drainage. No erythema edema or  purulence noted.  Musculoskeletal: MMT 5/5 bilateral lower extremities in DF, PF, Inversion and Eversion. Deceased ROM in DF of ankle joint. Gets slight 3/5 movement of the toe DF second digit currently. Elongated second toe. No pain to palpation.  Neurological: Sensation intact to light touch.   MRI right foot  IMPRESSION: 1. Moderate tendinosis of the second extensor digitorum tendon at the level of the cuneiforms with severe attenuation at the level of the TMT joint concerning for a high-grade partial versus complete tear. 2. Mild osteoarthritis of the second, third and fourth TMT joints. 3. Subchondral cystic changes and marrow edema in the distal navicular at the articulation with the middle cuneiform. 4. Mild subchondral marrow edema on either side of the medial-middle cuneiform articulation.  Assessment:   1. Ingrown right greater toenail   2. Hammertoe of right foot   3. Peripheral neuropathy due to edema b/l feet       Plan:  Patient was evaluated and treated and all questions answered. -MRI reviewed and discussed with patient. Believe the toe is improving and alternative for surgical correction is not something she would like to proceed with at this time. -Continue Rom exercises.  -Recommend protection, rest, ice, elevation daily until symptoms improve  Discussed ingrown toenails etiology and treatment options including procedure for removal vs conservative care.  Patient requesting removal of ingrown nail today. Procedure below.  Discussed procedure and post procedure care and patient expressed understanding.  Will follow-up in 2 weeks  for nail check or sooner if any problems arise.    Procedure:  Procedure: partial Nail Avulsion of right hallux medial nail border.  Surgeon: Louann Sjogren, DPM  Pre-op Dx: Ingrown toenail with and without infection Post-op: Same  Place of Surgery: Office exam room.  Indications for surgery: Painful and ingrown toenail.    The  patient is requesting removal of nail with  chemical matrixectomy. Risks and complications were discussed with the patient for which they understand and written consent was obtained. Under sterile conditions a total of 3 mL of  1% lidocaine plain was infiltrated in a hallux block fashion. Once anesthetized, the skin was prepped in sterile fashion. A tourniquet was then applied. Next the medial aspect of hallux nail border was then sharply excised making sure to remove the entire offending nail border.  Next phenol was then applied under standard conditions to permanently destroy the matrix and copiously irrigated. Silvadene was applied. A dry sterile dressing was applied. After application of the dressing the tourniquet was removed and there is found to be an immediate capillary refill time to the digit. The patient tolerated the procedure well without any complications. Post procedure instructions were discussed the patient for which he verbally understood. Follow-up in two weeks for nail check or sooner if any problems are to arise. Discussed signs/symptoms of infection and directed to call the office immediately should any occur or go directly to the emergency room. In the meantime, encouraged to call the office with any questions, concerns, changes symptoms.   Louann Sjogren, DPM

## 2023-11-12 NOTE — Patient Instructions (Signed)

## 2023-11-13 ENCOUNTER — Other Ambulatory Visit: Payer: Self-pay | Admitting: Family Medicine

## 2023-11-13 DIAGNOSIS — E782 Mixed hyperlipidemia: Secondary | ICD-10-CM

## 2023-12-04 ENCOUNTER — Encounter: Payer: Self-pay | Admitting: Podiatry

## 2023-12-04 ENCOUNTER — Ambulatory Visit: Payer: PPO | Admitting: Podiatry

## 2023-12-04 DIAGNOSIS — L600 Ingrowing nail: Secondary | ICD-10-CM

## 2023-12-04 NOTE — Progress Notes (Signed)
  Subjective:  Patient ID: [name], female    DOB: [birth year],   MRN: [id number]  No chief complaint on file.   78 y.o. female presents for follow-up of right ingrown nail procedure. Relates doing well and soaking as instructed.  Relates she is starting to get a bit more movement in the second toe.  . Denies any other pedal complaints. Denies n/v/f/c.   Past Medical History:  Diagnosis Date   Alcohol abuse 04/25/2015   reportedly drinks ~ 1 bottle of wine / day   Allergy    Anastomotic ulcer S/P gastric bypass 06/18/2016   Aortic atherosclerosis (HCC)    Arthritis    Cataract    Chronic diastolic heart failure (HCC) 10/25/2017   Degenerative disc disease, cervical    DVT, bilateral lower limbs (HCC) 05/2020   Age-indeterminate bilateral PERONEAL VEIN DVTs --> (was postop from acute cord compression, started on Xarelto  Dosepak   Essential hypertension 04/25/2015   GAD (generalized anxiety disorder) 06/14/2016   HNP (herniated nucleus pulposus) with myelopathy, cervical 05/27/2020   Cervical Myelopathy with Spinal cord compression (HCC)  --> s/p Anterior Cervical Discectomy Decompression)    Hypothyroidism    IDA (iron deficiency anemia)    Kidney damage    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 CFT <3 seconds. Normal hair growth on digits. No edema.  Skin. No lacerations or abrasions bilateral feet. Right hallux nail healing well.  Musculoskeletal: MMT 5/5 bilateral lower extremities in DF, PF, Inversion and Eversion. Deceased ROM in DF of ankle joint. Gets slight 3/5 movement of the toe DF second digit currently. Elongated second toe. No pain to palpation.  Neurological: Sensation intact to light touch.   MRI right foot  IMPRESSION: 1. Moderate tendinosis of the second  extensor digitorum tendon at the level of the cuneiforms with severe attenuation at the level of the TMT joint concerning for a high-grade partial versus complete tear. 2. Mild osteoarthritis of the second, third and fourth TMT joints. 3. Subchondral cystic changes and marrow edema in the distal navicular at the articulation with the middle cuneiform. 4. Mild subchondral marrow edema on either side of the medial-middle cuneiform articulation.  Assessment:   1. Ingrown right greater toenail       Plan:  Patient was evaluated and treated and all questions answered. -MRI reviewed and discussed with patient. Believe the toe is improving and alternative for surgical correction is not something she would like to proceed with at this time. -Continue Rom exercises.  -Recommend protection, rest, ice, elevation daily until symptoms improve  Toe was evaluated and appears to be healing well.  May discontinue soaks and neosporin.  Patient to follow-up as needed.     Asberry Failing, DPM

## 2023-12-06 ENCOUNTER — Ambulatory Visit: Payer: PPO | Admitting: Family Medicine

## 2023-12-07 ENCOUNTER — Ambulatory Visit (INDEPENDENT_AMBULATORY_CARE_PROVIDER_SITE_OTHER): Payer: PPO | Admitting: Family Medicine

## 2023-12-07 ENCOUNTER — Encounter: Payer: Self-pay | Admitting: Family Medicine

## 2023-12-07 VITALS — BP 149/78 | HR 67 | Ht 65.0 in | Wt 226.1 lb

## 2023-12-07 DIAGNOSIS — M549 Dorsalgia, unspecified: Secondary | ICD-10-CM | POA: Diagnosis not present

## 2023-12-07 DIAGNOSIS — E039 Hypothyroidism, unspecified: Secondary | ICD-10-CM

## 2023-12-07 DIAGNOSIS — Z23 Encounter for immunization: Secondary | ICD-10-CM | POA: Diagnosis not present

## 2023-12-07 DIAGNOSIS — F10982 Alcohol use, unspecified with alcohol-induced sleep disorder: Secondary | ICD-10-CM

## 2023-12-07 DIAGNOSIS — E782 Mixed hyperlipidemia: Secondary | ICD-10-CM

## 2023-12-07 DIAGNOSIS — G8929 Other chronic pain: Secondary | ICD-10-CM

## 2023-12-07 DIAGNOSIS — I10 Essential (primary) hypertension: Secondary | ICD-10-CM | POA: Diagnosis not present

## 2023-12-07 MED ORDER — ROSUVASTATIN CALCIUM 5 MG PO TABS: 5.0000 mg | ORAL_TABLET | Freq: Every day | ORAL | 1 refills | Status: DC

## 2023-12-07 MED ORDER — BENAZEPRIL-HYDROCHLOROTHIAZIDE 20-25 MG PO TABS: 1.0000 | ORAL_TABLET | Freq: Every day | ORAL | 1 refills | Status: DC

## 2023-12-07 MED ORDER — LEVOTHYROXINE SODIUM 125 MCG PO TABS
ORAL_TABLET | ORAL | 1 refills | Status: DC
Start: 2023-12-07 — End: 2024-07-03

## 2023-12-07 MED ORDER — TRAZODONE HCL 50 MG PO TABS: 25.0000 mg | ORAL_TABLET | Freq: Every evening | ORAL | 0 refills | Status: AC | PRN

## 2023-12-07 MED ORDER — CYCLOBENZAPRINE HCL 10 MG PO TABS: ORAL_TABLET | ORAL | 0 refills | Status: DC

## 2023-12-07 NOTE — Assessment & Plan Note (Signed)
 BP goal <130/80.  Continue benazepril -hydrochlorothiazide  20-25 mg daily, carvedilol  18.75 mg twice daily as prescribed by cardiology.  Continue working on reduced sodium intake and reduced alcohol consumption.  Follow-up with cardiology as scheduled on 02/11/2024.

## 2023-12-07 NOTE — Progress Notes (Signed)
 Established Patient Office Visit  Subjective   Patient ID: [name], female    DOB: 13-Mar-1946  Age: 78 y.o. MRN: [id number]  No chief complaint on file.   HPI [name] is a 78 y.o. female presenting today for follow up of hypertension, hyperlipidemia, hypothyroidism.  She notes that the treatments for her neuropathy have been helping.  She is able to feel her toes and get a sense of where her feet are which has been a huge difference. Hypertension: Followed by cardiology as well.  Due to uncontrolled blood pressure, increased carvedilol  dose to 1.5 tablets (18.75 mg) twice daily at last appointment.  Pt denies chest pain, SOB, dizziness, edema, syncope, fatigue or heart palpitations. Taking benazepril -HCTZ and carvedilol  around 2 AM, second dose of carvedilol  at around 12 PM, reports good compliance with treatment. Denies side effects.  She got out of her medication routine over the holidays but is getting back to it and also states that she is getting serious about her weight. Hyperlipidemia: tolerating rosuvastatin  well with no myalgias or significant side effects.  The 10-year ASCVD risk score (Arnett DK, et al., 2019) is: 33.8% Hypothyroidism: Taking levothyroxine  regularly in the AM away from food and vitamins. Denies fatigue, weight changes, heat/cold intolerance, skin/hair changes, bowel changes, CVS symptoms.   Outpatient Medications Prior to Visit  Medication Sig   acetaminophen  (TYLENOL ) 500 MG tablet Take 500 mg by mouth every 6 (six) hours as needed.   carvedilol  (COREG ) 12.5 MG tablet Take 1 tab in morning(12.5mg )  and 1/2 in evening (6.25mg ). (18.75mg  TOTAL DAILY) for 1 week.   Cholecalciferol (VITAMIN D3) 125 MCG (5000 UT) TABS Take 3 tablets (15,000 Units total) by mouth See admin instructions. Takes 15000 units 5 days a week and 20000 units on 2 days. (Patient taking differently: Take 15,000 Units by mouth See admin instructions. Takes 15,000 units 5  days a week and 20,000 units on 2 days.)   Cyanocobalamin  (VITAMIN B-12) 2500 MCG SUBL Place 2,500 mcg under the tongue once a week.   diphenhydrAMINE  (BENADRYL ) 25 MG tablet Take 25 mg by mouth daily as needed (Allergies).   ferrous sulfate  325 (65 FE) MG tablet Take 1 tablet (325 mg total) by mouth daily with breakfast.   furosemide  (LASIX ) 20 MG tablet Take 1 tablet (20 mg total) by mouth as needed. Take and additional if needed for fluid   gabapentin  (NEURONTIN ) 300 MG capsule TAKE 4 CAPSULES BY MOUTH EVERY MORNING, 3 CAPSULES IN THE AFTERNOON, AND 4 CAPSULES AT NIGHT   meloxicam  (MOBIC ) 15 MG tablet TAKE 1 TABLET BY MOUTH DAILY   pantoprazole  (PROTONIX ) 40 MG tablet TAKE 1 TABLET (40 MG TOTAL) BY MOUTH DAILY.   traMADol  (ULTRAM ) 50 MG tablet Take 1-2 tablets (50-100 mg total) by mouth 3 (three) times daily as needed.   [DISCONTINUED] benazepril -hydrochlorthiazide (LOTENSIN  HCT) 20-25 MG tablet TAKE 1 TABLET BY MOUTH DAILY.   [DISCONTINUED] cyclobenzaprine  (FLEXERIL ) 10 MG tablet TAKE 1 TABLET BY MOUTH DAILY AS NEEDED FOR MUSCLE SPASMS.   [DISCONTINUED] levothyroxine  (SYNTHROID ) 125 MCG tablet TAKE 1 TABLET (125 MCG TOTAL) BY MOUTH ONCE DAILY EVERY MORNING BEFORE BREAKFAST   [DISCONTINUED] LORazepam  (ATIVAN ) 2 MG tablet 1 tablet 2 hours prior to flying (Patient not taking: Reported on 10/16/2023)   [DISCONTINUED] predniSONE  (DELTASONE ) 50 MG tablet One tab PO daily for 5 days. (Patient not taking: Reported on 10/16/2023)   [DISCONTINUED] rosuvastatin  (CRESTOR ) 5 MG tablet TAKE 1 TABLET (5 MG TOTAL) BY MOUTH  DAILY.   [DISCONTINUED] traZODone  (DESYREL ) 50 MG tablet Take 0.5-1 tablets (25-50 mg total) by mouth at bedtime as needed. for sleep   No facility-administered medications prior to visit.    ROS Negative unless otherwise noted in HPI   Objective:     BP (!) 149/78   Pulse 67   Ht 5' 5 (1.651 m)   Wt 226 lb 1.9 oz (102.6 kg)   SpO2 97%   BMI 37.63 kg/m   Physical  Exam Constitutional:      General: She is not in acute distress.    Appearance: Normal appearance.  HENT:     Head: Normocephalic and atraumatic.  Cardiovascular:     Rate and Rhythm: Normal rate and regular rhythm.     Heart sounds: Murmur heard.     No friction rub. No gallop.  Pulmonary:     Effort: Pulmonary effort is normal. No respiratory distress.     Breath sounds: No wheezing, rhonchi or rales.  Skin:    General: Skin is warm and dry.  Neurological:     Mental Status: She is alert and oriented to person, place, and time.     Assessment & Plan:  Essential hypertension Assessment & Plan: BP goal <130/80.  Continue benazepril -hydrochlorothiazide  20-25 mg daily, carvedilol  18.75 mg twice daily as prescribed by cardiology.  Continue working on reduced sodium intake and reduced alcohol consumption.  Follow-up with cardiology as scheduled on 02/11/2024.  Orders: -     Benazepril -hydroCHLOROthiazide ; Take 1 tablet by mouth daily.  Dispense: 90 tablet; Refill: 1  Mixed hyperlipidemia Assessment & Plan: Last lipid panel: LDL 79, HDL 70, triglycerides 89.  Continue rosuvastatin  5 mg daily.  Will continue to monitor.  Orders: -     Rosuvastatin  Calcium ; Take 1 tablet (5 mg total) by mouth daily.  Dispense: 90 tablet; Refill: 1  Hypothyroidism, unspecified type Assessment & Plan: Last TSH within normal limits.  Continue levothyroxine  125 mcg daily.  Patient does not have any symptoms that are concerning for any thyroid  issues.  Will recheck thyroid  labs and make medication adjustments if indicated.  Orders: -     Levothyroxine  Sodium; TAKE 1 TABLET (125 MCG TOTAL) BY MOUTH ONCE DAILY EVERY MORNING BEFORE BREAKFAST  Dispense: 90 tablet; Refill: 1  Chronic back pain, unspecified back location, unspecified back pain laterality -     Cyclobenzaprine  HCl; TAKE 1 TABLET BY MOUTH DAILY AS NEEDED FOR MUSCLE SPASMS.  Dispense: 30 tablet; Refill: 0  Insomnia due to alcohol excess and  stress -     traZODone  HCl; Take 0.5-1 tablets (25-50 mg total) by mouth at bedtime as needed. for sleep  Dispense: 30 tablet; Refill: 0  Need for pneumococcal 20-valent conjugate vaccination -     Pneumococcal conjugate vaccine 20-valent    Return in about 4 months (around 04/05/2024) for follow-up for HTN, HLD, thyroid , fasting blood work 1 week before.    Joesph DELENA Sear, PA

## 2023-12-07 NOTE — Assessment & Plan Note (Signed)
 Last lipid panel: LDL 79, HDL 70, triglycerides 89.  Continue rosuvastatin 5 mg daily.  Will continue to monitor.

## 2023-12-07 NOTE — Assessment & Plan Note (Signed)
 Last TSH within normal limits.  Continue levothyroxine 125 mcg daily.  Patient does not have any symptoms that are concerning for any thyroid issues.  Will recheck thyroid labs and make medication adjustments if indicated.

## 2023-12-20 ENCOUNTER — Telehealth: Payer: Self-pay | Admitting: *Deleted

## 2023-12-20 NOTE — Telephone Encounter (Signed)
Copied from CRM [id number]. Topic: General - Other >> Dec 20, 2023  1:19 PM Whitney O wrote: Reason for CRM: [employer] is calling cause  we have a request for medication cyclobenzaprine (FLEXERIL) 10 MG tablet And we need additional information on the prior authorization  Call back number [phone] option 2

## 2023-12-20 NOTE — Telephone Encounter (Signed)
Contacted [employer] and supplied the information they needed and faxed over the office note.

## 2023-12-21 ENCOUNTER — Other Ambulatory Visit: Payer: Self-pay | Admitting: Family Medicine

## 2023-12-21 DIAGNOSIS — M62830 Muscle spasm of back: Secondary | ICD-10-CM

## 2023-12-21 MED ORDER — CYCLOBENZAPRINE HCL 10 MG PO TABS: ORAL_TABLET | ORAL | 0 refills | Status: AC

## 2023-12-31 ENCOUNTER — Other Ambulatory Visit: Payer: Self-pay | Admitting: Family Medicine

## 2023-12-31 DIAGNOSIS — K219 Gastro-esophageal reflux disease without esophagitis: Secondary | ICD-10-CM

## 2024-01-03 ENCOUNTER — Encounter: Payer: Self-pay | Admitting: Family Medicine

## 2024-02-11 ENCOUNTER — Ambulatory Visit: Payer: PPO | Admitting: Nurse Practitioner

## 2024-02-18 ENCOUNTER — Other Ambulatory Visit: Payer: Self-pay | Admitting: Family Medicine

## 2024-02-22 ENCOUNTER — Encounter: Payer: Self-pay | Admitting: Nurse Practitioner

## 2024-02-22 ENCOUNTER — Ambulatory Visit: Attending: Nurse Practitioner | Admitting: Nurse Practitioner

## 2024-02-22 VITALS — BP 120/90 | HR 66 | Ht 65.5 in | Wt 227.0 lb

## 2024-02-22 DIAGNOSIS — I10 Essential (primary) hypertension: Secondary | ICD-10-CM

## 2024-02-22 DIAGNOSIS — I872 Venous insufficiency (chronic) (peripheral): Secondary | ICD-10-CM | POA: Diagnosis not present

## 2024-02-22 DIAGNOSIS — I5032 Chronic diastolic (congestive) heart failure: Secondary | ICD-10-CM | POA: Diagnosis not present

## 2024-02-22 DIAGNOSIS — F1010 Alcohol abuse, uncomplicated: Secondary | ICD-10-CM

## 2024-02-22 DIAGNOSIS — I350 Nonrheumatic aortic (valve) stenosis: Secondary | ICD-10-CM

## 2024-02-22 DIAGNOSIS — R011 Cardiac murmur, unspecified: Secondary | ICD-10-CM | POA: Diagnosis not present

## 2024-02-22 DIAGNOSIS — E782 Mixed hyperlipidemia: Secondary | ICD-10-CM

## 2024-02-22 DIAGNOSIS — I340 Nonrheumatic mitral (valve) insufficiency: Secondary | ICD-10-CM

## 2024-02-22 NOTE — Progress Notes (Signed)
 Office Visit    Patient Name: [name] Date of Encounter: 02/22/2024  Primary Care Provider:  Melida Quitter, PA Primary Cardiologist:  Bryan Lemma, MD  Chief Complaint    78 year old female with a history of chronic diastolic heart failure, chronic bilateral lower extremity edema, chronic venous insufficiency, mitral valve regurgitation, aortic valve sclerosis, hypertension, hyperlipidemia, GI bleed, EtOH abuse, and obesity who presents for follow-up related to heart failure.  Past Medical History    Past Medical History:  Diagnosis Date   Alcohol abuse 04/25/2015   reportedly drinks ~ 1 bottle of wine / day   Allergy    Anastomotic ulcer S/P gastric bypass 06/18/2016   Aortic atherosclerosis (HCC)    Arthritis    Cataract    Chronic diastolic heart failure (HCC) 10/25/2017   Degenerative disc disease, cervical    DVT, bilateral lower limbs (HCC) 05/2020   Age-indeterminate bilateral PERONEAL VEIN DVTs --> (was postop from acute cord compression, started on Xarelto Dosepak   Essential hypertension 04/25/2015   GAD (generalized anxiety disorder) 06/14/2016   HNP (herniated nucleus pulposus) with myelopathy, cervical 05/27/2020   Cervical Myelopathy with Spinal cord compression (HCC)  --> s/p Anterior Cervical Discectomy Decompression)    Hypothyroidism    IDA (iron deficiency anemia)    Kidney damage    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   UGIB (upper gastrointestinal bleed) 04/25/2015   Venous stasis of both lower extremities 01/10/2019   Past Surgical History:  Procedure Laterality Date   ABDOMINAL HYSTERECTOMY     ANTERIOR CERVICAL DECOMP/DISCECTOMY FUSION Left 05/26/2020   Procedure: Cervical seven - Thoracic one ANTERIOR CERVICAL DISCECTOMY AND PLATING;  Surgeon: Tressie Stalker, MD;  Location: [facility] OR;  Service: Neurosurgery;  Laterality: Left;   CATARACT EXTRACTION Bilateral    June and July 2024   CHOLECYSTECTOMY      COSMETIC SURGERY     extra fat taken off arms and eyelid lifted   ESOPHAGOGASTRODUODENOSCOPY N/A 04/25/2015   Procedure: ESOPHAGOGASTRODUODENOSCOPY (EGD);  Surgeon: Beverley Fiedler, MD;  Location: [facility] ENDOSCOPY;  Service: Endoscopy;  Laterality: N/A;   FRACTURE SURGERY     GASTRIC BYPASS  2003   gastric ulcer repair  04/25/2015   NM MYOVIEW LTD  10/10/2017   Normal LV size and function EF 72%.  Medium sized mild severity defect in the apical septal apical lateral and apical wall suggestive of breast attenuation.  LOW RISK.  No ischemia or infarction.     OPEN REDUCTION INTERNAL FIXATION (ORIF) DISTAL RADIAL FRACTURE Right 01/15/2013   Procedure: OPEN REDUCTION INTERNAL FIXATION (ORIF) Right DISTAL RADIUS FRACTURE;  Surgeon: Eldred Manges, MD;  Location: [facility] OR;  Service: Orthopedics;  Laterality: Right;   SPINE SURGERY     TRANSTHORACIC ECHOCARDIOGRAM  10/04/2017   Mild LVH.  EF 60-65%.  Grade 2/moderate diastolic dysfunction.  Mild pulmonary hypertension.  No valve lesions   VENTRAL HERNIA REPAIR N/A 12/02/2020   Procedure: HERNIA REPAIR INCISIONAL  ADULT, open;  Surgeon: Leafy Ro, MD;  Location: [facility] ORS;  Service: General;  Laterality: N/A;    Allergies  Allergies  Allergen Reactions   Cocoa Anaphylaxis   Red Dye #40 (Allura Red) Hives    Rash and Nausea diarrhea   Lyrica [Pregabalin] Other (See Comments)    Abnormal Nose bleeding   Other Other (See Comments)    Berries : Rash   Sulfa Antibiotics Rash     Labs/Other Studies Reviewed  The following studies were reviewed today:  Cardiac Studies & Procedures   ______________________________________________________________________________________________   STRESS TESTS  MYOCARDIAL PERFUSION IMAGING 10/10/2017  Narrative  Nuclear stress EF: 72%. The left ventricular ejection fraction is hyperdynamic (>65%).  Defect 1: There is a medium defect of mild severity present in the apical septal, apical lateral and apex  location. This defect is most consistent with artifact/chest wall attenuation  This is a low risk study. There is no evidence of ischemia or infarction.  The study is normal.   ECHOCARDIOGRAM  ECHOCARDIOGRAM COMPLETE 08/08/2023  Narrative ECHOCARDIOGRAM REPORT    Patient Name:   [name] [facility] Date of Exam: 08/08/2023 Medical Rec #:  [id number]            Height:       65.0 in Accession #:    [id number]           Weight:       228.0 lb Date of Birth:  Oct 08, 1946           BSA:          2.091 m Patient Age:    76 years             BP:           153/83 mmHg Patient Gender: F                    HR:           74 bpm. Exam Location:  [address]  Procedure: 2D Echo, Cardiac Doppler, Color Doppler and Strain Analysis  Indications:    R01.1 Murmur  History:        Patient has prior history of Echocardiogram examinations, most recent 10/04/2017. Signs/Symptoms:Murmur; Risk Factors:Hypertension, Dyslipidemia and Morbid obesity.  Sonographer:    Samule Ohm RDCS Referring Phys: 23 DAVID W HARDING  IMPRESSIONS   1. Left ventricular ejection fraction, by estimation, is 60 to 65%. The left ventricle has normal function. The left ventricle has no regional wall motion abnormalities. There is mild left ventricular hypertrophy. Left ventricular diastolic parameters are indeterminate. 2. Right ventricular systolic function is normal. The right ventricular size is normal. There is normal pulmonary artery systolic pressure. The estimated right ventricular systolic pressure is 29.9 mmHg. 3. Left atrial size was moderately dilated. 4. The mitral valve is degenerative. Mild mitral valve regurgitation. Severe mitral annular calcification. 5. The aortic valve is tricuspid. Aortic valve regurgitation is trivial. Aortic valve sclerosis/calcification is present, without any evidence of aortic stenosis. 6. Aortic dilatation noted. There is dilatation of the ascending aorta, measuring 40  mm. 7. The inferior vena cava is dilated in size with >50% respiratory variability, suggesting right atrial pressure of 8 mmHg.  FINDINGS Left Ventricle: Left ventricular ejection fraction, by estimation, is 60 to 65%. The left ventricle has normal function. The left ventricle has no regional wall motion abnormalities. The left ventricular internal cavity size was normal in size. There is mild left ventricular hypertrophy. Left ventricular diastolic parameters are indeterminate.  Right Ventricle: The right ventricular size is normal. No increase in right ventricular wall thickness. Right ventricular systolic function is normal. There is normal pulmonary artery systolic pressure. The tricuspid regurgitant velocity is 2.34 m/s, and with an assumed right atrial pressure of 8 mmHg, the estimated right ventricular systolic pressure is 29.9 mmHg.  Left Atrium: Left atrial size was moderately dilated.  Right Atrium: Right atrial size was normal in size.  Pericardium: There is no  evidence of pericardial effusion.  Mitral Valve: The mitral valve is degenerative in appearance. Severe mitral annular calcification. Mild mitral valve regurgitation.  Tricuspid Valve: The tricuspid valve is normal in structure. Tricuspid valve regurgitation is trivial.  Aortic Valve: The aortic valve is tricuspid. Aortic valve regurgitation is trivial. Aortic regurgitation PHT measures 541 msec. Aortic valve sclerosis/calcification is present, without any evidence of aortic stenosis.  Pulmonic Valve: The pulmonic valve was not well visualized. Pulmonic valve regurgitation is trivial.  Aorta: The aortic root is normal in size and structure and aortic dilatation noted. There is dilatation of the ascending aorta, measuring 40 mm.  Venous: The inferior vena cava is dilated in size with greater than 50% respiratory variability, suggesting right atrial pressure of 8 mmHg.  IAS/Shunts: No atrial level shunt detected by color  flow Doppler.   LEFT VENTRICLE PLAX 2D LVIDd:         4.60 cm   Diastology LVIDs:         2.90 cm   LV e' medial:    7.94 cm/s LV PW:         1.30 cm   LV E/e' medial:  16.1 LV IVS:        1.30 cm   LV e' lateral:   8.49 cm/s LVOT diam:     2.10 cm   LV E/e' lateral: 15.1 LV SV:         87 LV SV Index:   42        2D Longitudinal Strain LVOT Area:     3.46 cm  2D Strain GLS (A2C):   12.2 % 2D Strain GLS (A3C):   12.9 % 2D Strain GLS (A4C):   13.7 % 2D Strain GLS Avg:     12.9 %  RIGHT VENTRICLE             IVC RV S prime:     12.00 cm/s  IVC diam: 2.10 cm TAPSE (M-mode): 2.2 cm RVSP:           24.9 mmHg  LEFT ATRIUM             Index        RIGHT ATRIUM           Index LA diam:        5.00 cm 2.39 cm/m   RA Pressure: 3.00 mmHg LA Vol (A2C):   83.6 ml 39.97 ml/m  RA Area:     13.50 cm LA Vol (A4C):   85.7 ml 40.98 ml/m  RA Volume:   33.40 ml  15.97 ml/m LA Biplane Vol: 88.6 ml 42.36 ml/m AORTIC VALVE LVOT Vmax:   110.00 cm/s LVOT Vmean:  72.000 cm/s LVOT VTI:    0.251 m AI PHT:      541 msec  AORTA Ao Root diam: 3.20 cm Ao Asc diam:  3.95 cm  MITRAL VALVE                TRICUSPID VALVE MV Area (PHT): 3.48 cm     TR Peak grad:   21.9 mmHg MV Decel Time: 218 msec     TR Vmax:        234.00 cm/s MV E velocity: 128.00 cm/s  Estimated RAP:  3.00 mmHg MV A velocity: 110.00 cm/s  RVSP:           24.9 mmHg MV E/A ratio:  1.16 SHUNTS Systemic VTI:  0.25 m Systemic Diam: 2.10 cm  Epifanio Lesches MD Electronically  signed by Epifanio Lesches MD Signature Date/Time: 08/08/2023/4:36:51 PM    Final          ______________________________________________________________________________________________     Recent Labs: 08/20/2023: ALT 19; BUN 19; Creatinine, Ser 0.80; Hemoglobin 14.0; Platelets 235; Potassium 4.6; Sodium 135; TSH 0.750  Recent Lipid Panel    Component Value Date/Time   CHOL 165 08/20/2023 1048   TRIG 89 08/20/2023 1048   HDL 70  08/20/2023 1048   CHOLHDL 2.4 08/20/2023 1048   CHOLHDL 3.5 08/15/2016 0912   VLDL 42 (H) 08/15/2016 0912   LDLCALC 79 08/20/2023 1048   LDLDIRECT 74 08/18/2021 1509    History of Present Illness    78 year old female with the above past medical history including chronic diastolic heart failure, chronic bilateral lower extremity edema, chronic venous insufficiency, mitral valve regurgitation, aortic valve sclerosis, hypertension, hyperlipidemia, GI bleed, EtOH abuse, and obesity.  Lexiscan Myoview in 2018 was low risk.  ABIs in 2018 and normal.  Most recent echocardiogram in 07/2023 showed EF 60 to 65%, normal LV function, no RWMA, mild LVH, normal RV systolic function, mild mitral valve regurgitation, aortic valve sclerosis without evidence of stenosis.  She was last seen in office on 08/13/2023 was stable from a cardiac standpoint.  Carvedilol was increased in the setting of intermittently elevated blood pressure.  She was encouraged to decrease her alcohol consumption.  She presents today for follow-up accompanied by her husband.  Since her last visit she has been stable from a cardiac standpoint. She has stable chronic dyspnea on exertion, unchanged from prior visits. She denies any chest pain.  She has stable nonpitting bilateral lower extremity edema, improved with compression, elevation.  She has not required Lasix in approximately 1 month.  She continues to drink heavily.  BP has been stable at home, mildly elevated in office today.  Overall much improved with increased carvedilol dosing.    Home Medications    Current Outpatient Medications  Medication Sig Dispense Refill   acetaminophen (TYLENOL) 500 MG tablet Take 500 mg by mouth every 6 (six) hours as needed.     benazepril-hydrochlorthiazide (LOTENSIN HCT) 20-25 MG tablet Take 1 tablet by mouth daily. 90 tablet 1   carvedilol (COREG) 12.5 MG tablet Take 1 tab in morning(12.5mg )  and 1/2 in evening (6.25mg ). (18.75mg  TOTAL DAILY) for  1 week. 180 tablet 3   Cholecalciferol (VITAMIN D3) 125 MCG (5000 UT) TABS Take 3 tablets (15,000 Units total) by mouth See admin instructions. Takes 16109 units 5 days a week and 20000 units on 2 days. (Patient taking differently: Take 15,000 Units by mouth See admin instructions. Takes 15,000 units 5 days a week and 20,000 units on 2 days.) 30 tablet 0   Cyanocobalamin (VITAMIN B-12) 2500 MCG SUBL Place 2,500 mcg under the tongue once a week.     cyclobenzaprine (FLEXERIL) 10 MG tablet TAKE 1 TABLET BY MOUTH AS NEEDED UP TO ONCE DAILY FOR ACUTE MUSCLE SPASMS. 30 tablet 0   diphenhydrAMINE (BENADRYL) 25 MG tablet Take 25 mg by mouth daily as needed (Allergies).     ferrous sulfate 325 (65 FE) MG tablet Take 1 tablet (325 mg total) by mouth daily with breakfast. 30 tablet 3   furosemide (LASIX) 20 MG tablet Take 1 tablet (20 mg total) by mouth as needed. Take and additional if needed for fluid 90 tablet 3   gabapentin (NEURONTIN) 300 MG capsule TAKE 4 CAPSULES BY MOUTH EVERY MORNING, 3 CAPSULES IN THE AFTERNOON, AND 4 CAPSULES AT  NIGHT. 990 capsule 1   levothyroxine (SYNTHROID) 125 MCG tablet TAKE 1 TABLET (125 MCG TOTAL) BY MOUTH ONCE DAILY EVERY MORNING BEFORE BREAKFAST 90 tablet 1   meloxicam (MOBIC) 15 MG tablet TAKE 1 TABLET BY MOUTH DAILY 90 tablet 1   pantoprazole (PROTONIX) 40 MG tablet TAKE 1 TABLET (40 MG TOTAL) BY MOUTH DAILY. 90 tablet 1   rosuvastatin (CRESTOR) 5 MG tablet Take 1 tablet (5 mg total) by mouth daily. 90 tablet 1   traMADol (ULTRAM) 50 MG tablet Take 1-2 tablets (50-100 mg total) by mouth 3 (three) times daily as needed. 60 tablet 3   traZODone (DESYREL) 50 MG tablet Take 0.5-1 tablets (25-50 mg total) by mouth at bedtime as needed. for sleep 30 tablet 0   No current facility-administered medications for this visit.     Review of Systems    She denies chest pain, palpitations, pnd, orthopnea, n, v, dizziness, syncope, weight gain, or early satiety. All other systems  reviewed and are otherwise negative except as noted above.   Physical Exam    VS:  BP (!) 120/90   Pulse 66   Ht 5' 5.5" (1.664 m)   Wt 227 lb (103 kg)   SpO2 98%   BMI 37.20 kg/m  GEN: Well nourished, well developed, in no acute distress. HEENT: normal. Neck: Supple, no JVD, carotid bruits, or masses. Cardiac: RRR, no murmurs, rubs, or gallops. No clubbing, cyanosis, nonpitting bilateral lower extremity edema.  Radials/DP/PT 2+ and equal bilaterally.  Respiratory:  Respirations regular and unlabored, clear to auscultation bilaterally. GI: Soft, nontender, nondistended, BS + x 4. MS: no deformity or atrophy. Skin: warm and dry, no rash. Neuro:  Strength and sensation are intact. Psych: Normal affect.  Accessory Clinical Findings    ECG personally reviewed by me today -    - no acute changes.   Lab Results  Component Value Date   WBC 7.6 08/20/2023   HGB 14.0 08/20/2023   HCT 42.8 08/20/2023   MCV 104 (H) 08/20/2023   PLT 235 08/20/2023   Lab Results  Component Value Date   CREATININE 0.80 08/20/2023   BUN 19 08/20/2023   NA 135 08/20/2023   K 4.6 08/20/2023   CL 93 (L) 08/20/2023   CO2 23 08/20/2023   Lab Results  Component Value Date   ALT 19 08/20/2023   AST 19 08/20/2023   ALKPHOS 112 08/20/2023   BILITOT 0.7 08/20/2023   Lab Results  Component Value Date   CHOL 165 08/20/2023   HDL 70 08/20/2023   LDLCALC 79 08/20/2023   LDLDIRECT 74 08/18/2021   TRIG 89 08/20/2023   CHOLHDL 2.4 08/20/2023    Lab Results  Component Value Date   HGBA1C 5.4 08/20/2023    Assessment & Plan    1. Chronic diastolic heart failure: Echo in 07/2023 showed EF 60 to 65%, normal LV function, no RWMA, mild LVH, normal RV systolic function, mild mitral valve regurgitation, aortic valve sclerosis without evidence of stenosis.  She has stable chronic dyspnea, unchanged from prior visits, stable nonpitting bilateral lower extremity edema, improved with compression and elevation,  she has not required Lasix recently.  2. Venous insufficiency: Continue compression, elevation.  3. Cardiac murmur/valvular heart disease: History of mild mitral valve regurgitation, aortic valve sclerosis stable on most recent echo in 07/2023.  Generally euvolemic and well compensated on exam.  Repeat echocardiogram as clinically indicated.  4. Hypertension: BP mildly elevated in office today, generally well controlled. Continue  current antihypertensive regimen.   5. Hyperlipidemia:  LDL was 79 in 07/2023.  Continue Crestor.  6. EtOH use: She continues to drink heavily, encouraged decreased alcohol intake.   7. Disposition: Follow-up in 6 months, sooner if needed.    Joylene Grapes, NP 02/22/2024, 2:22 PM

## 2024-02-22 NOTE — Patient Instructions (Signed)
 Medication Instructions:  Your physician recommends that you continue on your current medications as directed. Please refer to the Current Medication list given to you today.   *If you need a refill on your cardiac medications before your next appointment, please call your pharmacy*  Lab Work: NONE ordered at this time of appointment   Testing/Procedures: NONE ordered at this time of appointment    Follow-Up: At [facility], you and your health needs are our priority.  As part of our continuing mission to provide you with exceptional heart care, our providers are all part of one team.  This team includes your primary Cardiologist (physician) and Advanced Practice Providers or APPs (Physician Assistants and Nurse Practitioners) who all work together to provide you with the care you need, when you need it.  Your next appointment:   6 month(s)  Provider:   Bryan Lemma, MD     We recommend signing up for the patient portal called "MyChart".  Sign up information is provided on this After Visit Summary.  MyChart is used to connect with patients for Virtual Visits (Telemedicine).  Patients are able to view lab/test results, encounter notes, upcoming appointments, etc.  Non-urgent messages can be sent to your provider as well.   To learn more about what you can do with MyChart, go to [email or web address].   Other Instructions       1st Floor: - Lobby - Registration  - Pharmacy  - Lab - Cafe  2nd Floor: - PV Lab - Diagnostic Testing (echo, CT, nuclear med)  3rd Floor: - Vacant  4th Floor: - TCTS (cardiothoracic surgery) - AFib Clinic - Structural Heart Clinic - Vascular Surgery  - Vascular Ultrasound  5th Floor: - HeartCare Cardiology (general and EP) - Clinical Pharmacy for coumadin, hypertension, lipid, weight-loss medications, and med management appointments    Valet parking services will be available as well.

## 2024-02-24 ENCOUNTER — Encounter: Payer: Self-pay | Admitting: Nurse Practitioner

## 2024-03-18 ENCOUNTER — Other Ambulatory Visit: Payer: Self-pay | Admitting: Sports Medicine

## 2024-03-18 DIAGNOSIS — M170 Bilateral primary osteoarthritis of knee: Secondary | ICD-10-CM

## 2024-03-20 ENCOUNTER — Other Ambulatory Visit: Payer: Self-pay | Admitting: Sports Medicine

## 2024-03-20 DIAGNOSIS — M170 Bilateral primary osteoarthritis of knee: Secondary | ICD-10-CM

## 2024-03-28 ENCOUNTER — Other Ambulatory Visit: Payer: Self-pay | Admitting: *Deleted

## 2024-03-28 DIAGNOSIS — E039 Hypothyroidism, unspecified: Secondary | ICD-10-CM

## 2024-03-28 DIAGNOSIS — E559 Vitamin D deficiency, unspecified: Secondary | ICD-10-CM

## 2024-03-28 DIAGNOSIS — E538 Deficiency of other specified B group vitamins: Secondary | ICD-10-CM

## 2024-03-28 DIAGNOSIS — I10 Essential (primary) hypertension: Secondary | ICD-10-CM

## 2024-03-28 DIAGNOSIS — E782 Mixed hyperlipidemia: Secondary | ICD-10-CM

## 2024-03-31 ENCOUNTER — Other Ambulatory Visit: Payer: PPO

## 2024-03-31 DIAGNOSIS — E039 Hypothyroidism, unspecified: Secondary | ICD-10-CM

## 2024-03-31 DIAGNOSIS — E559 Vitamin D deficiency, unspecified: Secondary | ICD-10-CM

## 2024-03-31 DIAGNOSIS — I10 Essential (primary) hypertension: Secondary | ICD-10-CM

## 2024-03-31 DIAGNOSIS — E538 Deficiency of other specified B group vitamins: Secondary | ICD-10-CM

## 2024-03-31 DIAGNOSIS — E782 Mixed hyperlipidemia: Secondary | ICD-10-CM

## 2024-04-01 LAB — CBC WITH DIFFERENTIAL/PLATELET
Basophils Absolute: 0.1 10*3/uL (ref 0.0–0.2)
Basos: 2 %
EOS (ABSOLUTE): 0.3 10*3/uL (ref 0.0–0.4)
Eos: 6 %
Hematocrit: 39.6 % (ref 34.0–46.6)
Hemoglobin: 13.5 g/dL (ref 11.1–15.9)
Immature Grans (Abs): 0 10*3/uL (ref 0.0–0.1)
Immature Granulocytes: 0 %
Lymphocytes Absolute: 1.8 10*3/uL (ref 0.7–3.1)
Lymphs: 37 %
MCH: 34.9 pg — ABNORMAL HIGH (ref 26.6–33.0)
MCHC: 34.1 g/dL (ref 31.5–35.7)
MCV: 102 fL — ABNORMAL HIGH (ref 79–97)
Monocytes Absolute: 0.4 10*3/uL (ref 0.1–0.9)
Monocytes: 9 %
Neutrophils Absolute: 2.3 10*3/uL (ref 1.4–7.0)
Neutrophils: 46 %
Platelets: 192 10*3/uL (ref 150–450)
RBC: 3.87 x10E6/uL (ref 3.77–5.28)
RDW: 12.5 % (ref 11.7–15.4)
WBC: 4.8 10*3/uL (ref 3.4–10.8)

## 2024-04-01 LAB — B12 AND FOLATE PANEL
Folate: 15.6 ng/mL (ref >3.0)
Vitamin B-12: 155 pg/mL — ABNORMAL LOW (ref 232–1245)

## 2024-04-01 LAB — COMPREHENSIVE METABOLIC PANEL WITH GFR
ALT: 15 IU/L (ref 0–32)
AST: 29 IU/L (ref 0–40)
Albumin: 4.2 g/dL (ref 3.8–4.8)
Alkaline Phosphatase: 122 IU/L — ABNORMAL HIGH (ref 44–121)
BUN/Creatinine Ratio: 20 (ref 12–28)
BUN: 17 mg/dL (ref 8–27)
Bilirubin Total: 0.5 mg/dL (ref 0.0–1.2)
CO2: 19 mmol/L — ABNORMAL LOW (ref 20–29)
Calcium: 9.3 mg/dL (ref 8.7–10.3)
Chloride: 101 mmol/L (ref 96–106)
Creatinine, Ser: 0.84 mg/dL (ref 0.57–1.00)
Globulin, Total: 2.9 g/dL (ref 1.5–4.5)
Glucose: 94 mg/dL (ref 70–99)
Potassium: 6.3 mmol/L — ABNORMAL HIGH (ref 3.5–5.2)
Sodium: 140 mmol/L (ref 134–144)
Total Protein: 7.1 g/dL (ref 6.0–8.5)
eGFR: 72 mL/min/{1.73_m2} (ref >59)

## 2024-04-01 LAB — TSH: TSH: 0.591 u[IU]/mL (ref 0.450–4.500)

## 2024-04-01 LAB — LIPID PANEL
Chol/HDL Ratio: 2.7 ratio (ref 0.0–4.4)
Cholesterol, Total: 169 mg/dL (ref 100–199)
HDL: 62 mg/dL (ref >39)
LDL Chol Calc (NIH): 84 mg/dL (ref 0–99)
Triglycerides: 134 mg/dL (ref 0–149)
VLDL Cholesterol Cal: 23 mg/dL (ref 5–40)

## 2024-04-01 LAB — HEMOGLOBIN A1C
Est. average glucose Bld gHb Est-mCnc: 114 mg/dL
Hgb A1c MFr Bld: 5.6 % (ref 4.8–5.6)

## 2024-04-01 LAB — VITAMIN D 25 HYDROXY (VIT D DEFICIENCY, FRACTURES): Vit D, 25-Hydroxy: 35.4 ng/mL (ref 30.0–100.0)

## 2024-04-03 ENCOUNTER — Telehealth: Payer: Self-pay

## 2024-04-03 ENCOUNTER — Telehealth: Payer: Self-pay | Admitting: *Deleted

## 2024-04-03 ENCOUNTER — Other Ambulatory Visit: Payer: Self-pay | Admitting: *Deleted

## 2024-04-03 DIAGNOSIS — I10 Essential (primary) hypertension: Secondary | ICD-10-CM

## 2024-04-03 NOTE — Telephone Encounter (Signed)
 Copied from CRM [id number]. Topic: Clinical - Request for Lab/Test Order >> Apr 03, 2024  1:16 PM Ethelle Herb L wrote: Reason for CRM: Patient scheduled lab visit to recheck potassium levels per Dr. Loralie Rocher result notes for labs.   Order needing to be placed for recheck. Confirmed w/ CAL okay to schedule, requested CRM be sent to have orders placed after scheduling.

## 2024-04-03 NOTE — Telephone Encounter (Signed)
-----   Message from Laneta Pintos sent at 04/02/2024  9:03 PM EDT ----- Low B12, potassium is significantly elevated.    Please call the pt and see if she can come in for a lab visit to repeat her potassium level today if possible.

## 2024-04-03 NOTE — Telephone Encounter (Signed)
 LVM to return call for results

## 2024-04-04 ENCOUNTER — Other Ambulatory Visit

## 2024-04-04 DIAGNOSIS — I10 Essential (primary) hypertension: Secondary | ICD-10-CM

## 2024-04-05 LAB — BASIC METABOLIC PANEL WITH GFR
BUN/Creatinine Ratio: 21 (ref 12–28)
BUN: 19 mg/dL (ref 8–27)
CO2: 24 mmol/L (ref 20–29)
Calcium: 9.2 mg/dL (ref 8.7–10.3)
Chloride: 94 mmol/L — ABNORMAL LOW (ref 96–106)
Creatinine, Ser: 0.89 mg/dL (ref 0.57–1.00)
Glucose: 87 mg/dL (ref 70–99)
Potassium: 4.4 mmol/L (ref 3.5–5.2)
Sodium: 132 mmol/L — ABNORMAL LOW (ref 134–144)
eGFR: 67 mL/min/{1.73_m2} (ref >59)

## 2024-04-07 ENCOUNTER — Ambulatory Visit: Payer: PPO | Admitting: Family Medicine

## 2024-04-10 ENCOUNTER — Ambulatory Visit: Payer: Self-pay | Admitting: Family Medicine

## 2024-04-11 ENCOUNTER — Ambulatory Visit (INDEPENDENT_AMBULATORY_CARE_PROVIDER_SITE_OTHER): Admitting: Family Medicine

## 2024-04-11 ENCOUNTER — Encounter: Payer: Self-pay | Admitting: Family Medicine

## 2024-04-11 VITALS — BP 145/80 | HR 59 | Ht 65.5 in | Wt 232.4 lb

## 2024-04-11 DIAGNOSIS — F40243 Fear of flying: Secondary | ICD-10-CM | POA: Diagnosis not present

## 2024-04-11 DIAGNOSIS — E039 Hypothyroidism, unspecified: Secondary | ICD-10-CM | POA: Diagnosis not present

## 2024-04-11 DIAGNOSIS — F5104 Psychophysiologic insomnia: Secondary | ICD-10-CM | POA: Diagnosis not present

## 2024-04-11 DIAGNOSIS — I10 Essential (primary) hypertension: Secondary | ICD-10-CM

## 2024-04-11 MED ORDER — ONDANSETRON HCL 4 MG PO TABS: 4.0000 mg | ORAL_TABLET | Freq: Three times a day (TID) | ORAL | 0 refills | Status: AC | PRN

## 2024-04-11 MED ORDER — LORAZEPAM 2 MG PO TABS: ORAL_TABLET | ORAL | 0 refills | Status: AC

## 2024-04-11 NOTE — Patient Instructions (Addendum)
 It was nice to see you today,  We addressed the following topics today: -I have sent in the Zofran  and the Ativan . - Please check your blood pressure 1-2 times a day and then bring back the results after you have had 2 weeks worth of results documented. - I will see back in 6 months.  Then can you handle her mother's blood pressure reading sheets  Have a great day,  Etha Henle, MD

## 2024-04-11 NOTE — Assessment & Plan Note (Signed)
 Elevated today, somewhat better on repeat. Advised her to document home readings and return them to us  before deciding on changing her medication.

## 2024-04-11 NOTE — Assessment & Plan Note (Signed)
 Normal tsh.  Continue 125mcg

## 2024-04-11 NOTE — Progress Notes (Signed)
   Established Patient Office Visit  Subjective   Patient ID: [name], female    DOB: [birth year]  Age: 78 y.o. MRN: [id number]  Chief Complaint  Patient presents with   Medical Management of Chronic Issues    HPI  Subjective - Routine 17-month follow-up visit - Reports doing well overall - Weight increased due to increased cooking - Planning [place]  cruise in 1 month - Requests Ativan  for plane travel anxiety (10 tablets of 2mg  - takes half tablet initially, then other half if needed) - Requests Zofran  for motion sickness during cruise (8 tablets) - Reports taking Dramamine causes excessive drowsiness - Reports history of gastric bypass surgery affecting B12 absorption  - Reports history of spine fracture 4 years ago with limited mobility since - Reports history of neuropathy with improvement from treatments - Reports previous falls (3 in one month) before starting neuropathy treatment - No falls since starting treatment  - Reports rarely using trazodone  for sleep - Uses Dramamine for sleep instead - Reports trazodone  causes awakening after 2 hours with anxiety - Reports home BP typically high 130s-low 140s/70s after sitting and relaxing  Medications Carvedilol  18.75mg  twice daily, Benazepril -HCTZ (dose not specified), Levothyroxine  125mcg daily, Trazodone  (rarely used), Furosemide  (rarely used), B12 supplement (sublingual, not taking regularly)  PMH: Hypertension, Hypothyroidism, Neuropathy, B12 deficiency PSH: Gastric bypass surgery, Spine fracture 4 years ago 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 Accompanied by husband Cv: rrr Pulm: lctab  No results found for any visits on 04/11/24.      Assessment & Plan:   Essential hypertension Assessment & Plan: Elevated today, somewhat better on repeat. Advised her to document home readings and return them to us  before deciding on changing her medication.     Hypothyroidism, unspecified type Assessment & Plan: Normal tsh.  Continue 125mcg   Fear of flying Assessment & Plan: Prescribed ativan  2mg  for upcoming trip   Psychophysiological insomnia Assessment & Plan: Uses dramamine for sleep after initially trying it for motion sickness, trazodone  dose not prevent early awakenings   Other orders -     Ondansetron  HCl; Take 1 tablet (4 mg total) by mouth every 8 (eight) hours as needed for nausea or vomiting.  Dispense: 15 tablet; Refill: 0 -     LORazepam ; Take 1 tablet 1 hour prior to flying  Dispense: 10 tablet; Refill: 0     Return in about 6 months (around 10/12/2024) for hld, HTN, tsh.    Laneta Pintos, MD

## 2024-04-13 NOTE — Assessment & Plan Note (Signed)
 Uses dramamine for sleep after initially trying it for motion sickness, trazodone  dose not prevent early awakenings

## 2024-04-13 NOTE — Assessment & Plan Note (Signed)
 Prescribed ativan  2mg  for upcoming trip

## 2024-04-15 ENCOUNTER — Telehealth: Payer: Self-pay | Admitting: *Deleted

## 2024-04-15 NOTE — Telephone Encounter (Signed)
 Copied from CRM [id number]. Topic: General - Other >> Apr 14, 2024  4:55 PM Stanly Early wrote: Reason for CRM: Sonu needs additional clinical information for med authorization. Caller stated she has the form just need the other information.  fax:[phone] [phone] option 2

## 2024-04-17 ENCOUNTER — Other Ambulatory Visit: Payer: Self-pay | Admitting: Family Medicine

## 2024-04-17 ENCOUNTER — Telehealth: Payer: Self-pay

## 2024-04-17 NOTE — Telephone Encounter (Signed)
-----   Message from Laneta Pintos sent at 04/17/2024 12:08 PM EDT ----- Please let the patient know that for some reason her insurance is not covering the zofran .  If she has not picked it up already she can use a goodrx coupon and it shouldn't cost more than 10-20 dollars.

## 2024-04-17 NOTE — Telephone Encounter (Signed)
 Tried to call this person back and it states that I have dialed a number that is not available in this area.

## 2024-04-17 NOTE — Telephone Encounter (Signed)
 Called pt LM for her to contact the office

## 2024-04-18 ENCOUNTER — Telehealth: Payer: Self-pay

## 2024-04-18 NOTE — Telephone Encounter (Signed)
-----   Message from Laneta Pintos sent at 04/17/2024 12:08 PM EDT ----- Please let the patient know that for some reason her insurance is not covering the zofran .  If she has not picked it up already she can use a goodrx coupon and it shouldn't cost more than 10-20 dollars.

## 2024-04-18 NOTE — Telephone Encounter (Signed)
 Copied from CRM [id number]. Topic: General - Other >> Apr 18, 2024  9:36 AM Emylou G wrote: Reason for CRM: relayed msg about zofran  to patient    Patient was advised

## 2024-04-18 NOTE — Telephone Encounter (Signed)
 Called pt LVM to contact the office

## 2024-04-18 NOTE — Telephone Encounter (Signed)
 Patient has been advised

## 2024-07-01 ENCOUNTER — Telehealth: Payer: Self-pay | Admitting: Family Medicine

## 2024-07-01 DIAGNOSIS — E039 Hypothyroidism, unspecified: Secondary | ICD-10-CM

## 2024-07-03 ENCOUNTER — Other Ambulatory Visit: Payer: Self-pay

## 2024-07-03 DIAGNOSIS — E039 Hypothyroidism, unspecified: Secondary | ICD-10-CM

## 2024-07-03 MED ORDER — LEVOTHYROXINE SODIUM 125 MCG PO TABS: ORAL_TABLET | ORAL | 1 refills | Status: AC

## 2024-07-24 ENCOUNTER — Other Ambulatory Visit: Payer: Self-pay | Admitting: Family Medicine

## 2024-07-24 DIAGNOSIS — I10 Essential (primary) hypertension: Secondary | ICD-10-CM

## 2024-07-24 DIAGNOSIS — K219 Gastro-esophageal reflux disease without esophagitis: Secondary | ICD-10-CM

## 2024-07-24 DIAGNOSIS — E782 Mixed hyperlipidemia: Secondary | ICD-10-CM

## 2024-07-29 ENCOUNTER — Encounter: Payer: Self-pay | Admitting: Sports Medicine

## 2024-08-15 ENCOUNTER — Encounter (HOSPITAL_BASED_OUTPATIENT_CLINIC_OR_DEPARTMENT_OTHER): Payer: Self-pay

## 2024-08-19 ENCOUNTER — Encounter: Payer: Self-pay | Admitting: Cardiology

## 2024-08-19 ENCOUNTER — Ambulatory Visit: Attending: Cardiology | Admitting: Cardiology

## 2024-08-19 VITALS — BP 144/84 | HR 61 | Ht 65.5 in | Wt 234.0 lb

## 2024-08-19 DIAGNOSIS — I872 Venous insufficiency (chronic) (peripheral): Secondary | ICD-10-CM

## 2024-08-19 DIAGNOSIS — I10 Essential (primary) hypertension: Secondary | ICD-10-CM | POA: Diagnosis not present

## 2024-08-19 DIAGNOSIS — I5032 Chronic diastolic (congestive) heart failure: Secondary | ICD-10-CM

## 2024-08-19 DIAGNOSIS — E782 Mixed hyperlipidemia: Secondary | ICD-10-CM

## 2024-08-19 DIAGNOSIS — I350 Nonrheumatic aortic (valve) stenosis: Secondary | ICD-10-CM | POA: Diagnosis not present

## 2024-08-19 MED ORDER — FUROSEMIDE 20 MG PO TABS: 20.0000 mg | ORAL_TABLET | Freq: Every day | ORAL | 3 refills | Status: AC | PRN

## 2024-08-19 MED ORDER — CARVEDILOL 6.25 MG PO TABS: ORAL_TABLET | ORAL | 3 refills | Status: AC

## 2024-08-19 NOTE — Patient Instructions (Signed)
 Medication Instructions:  May take an extra 6.25 mg of Carvedilol  for sustained high Blood pressure 150 or more.  May take an extra 20 mg of lasix  if needed for swelling to equal 40 mg.  *If you need a refill on your cardiac medications before your next appointment, please call your pharmacy*  Lab Work: None ordered If you have labs (blood work) drawn today and your tests are completely normal, you will receive your results only by: MyChart Message (if you have MyChart) OR A paper copy in the mail If you have any lab test that is abnormal or we need to change your treatment, we will call you to review the results.  Testing/Procedures: None ordered  Follow-Up: At [facility], you and your health needs are our priority.  As part of our continuing mission to provide you with exceptional heart care, our providers are all part of one team.  This team includes your primary Cardiologist (physician) and Advanced Practice Providers or APPs (Physician Assistants and Nurse Practitioners) who all work together to provide you with the care you need, when you need it.  Your next appointment:   1 year(s)  Provider:   Alm Clay, MD    We recommend signing up for the patient portal called MyChart.  Sign up information is provided on this After Visit Summary.  MyChart is used to connect with patients for Virtual Visits (Telemedicine).  Patients are able to view lab/test results, encounter notes, upcoming appointments, etc.  Non-urgent messages can be sent to your provider as well.   To learn more about what you can do with MyChart, go to [email or web address].

## 2024-08-19 NOTE — Progress Notes (Signed)
 Cardiology Office Note:  .   Date:  08/28/2024  ID:  [name], [name] 03-14-1946, MRN [id number] PCP: Chandra Toribio POUR, MD  [facility] Providers Cardiologist:  Alm Clay, MD     Chief Complaint  Patient presents with   Cardiac Valve Problem   Follow-up    Doing fairly well.    Patient Profile: .     [name] is a very sedentary morbidly obese 78 y.o. female heavy alcohol drinker with a PMH noted below who presents here for 36-month follow-up Assistant PCP Wallace Joesph LABOR, PA.  [name] is a ~morbidly obese 78 y.o. female with a PMH noted below who presents here for 1 month virtual follow-up at the request of Wallace Joesph LABOR, PA.  PMH: Chronic HFpEF w/ chronic LE edema complicated by venous insufficiency HTN, HLD Obesity-history of GOP surgery History of EtOH abuse -> currently drinking vodka as opposed to significant wine.  She also does drink wine. History of GI bleed        [name] was last seen by Damien Braver, NP on 02/02/2024 as usual accompanied by her husband.  Stable chronic exertional dyspnea.  No chest pain.  Stable nonpitting bilateral lower extremity edema improved with compression and elevation.  Had not yet required Lasix  for a month.  Still drinking heavy amounts of alcohol.  BP stable but elevated in the office.  Overall improved with carvedilol  no changes made.  Subjective  Discussed the use of AI scribe software for clinical note transcription with the patient, who gave verbal consent to proceed.  History of Present Illness [name] is a 78 year old female with chronic venous insufficiency who presents for a six-month follow-up.  She has chronic venous insufficiency with persistent edema primarily affecting her lower legs. The swelling is generally well-managed but worsens when she falls asleep sitting up with her feet dependent for several hours. Elevating her feet before sleep improves  the swelling. She does not frequently use Lasix  due to poor response in the past, and only considers it when experiencing truncal edema.  An echocardiogram performed on August 20, 2024, showed an ejection fraction of 60-65%, mild left ventricular hypertrophy, normal pulmonary artery pressures, moderate regurgitation, mildly elevated right atrial pressure, and a dilated thoracic aorta measuring 40 millimeters.  She uses two canes for mobility due to a spinal cord injury and reports that walking can be tiring, particularly over long distances. No shortness of breath or chest pain during these activities.  Her blood pressure at home typically runs in the 130s to 150s over low 80s, depending on the time of day and activity level. She is currently taking carvedilol  6.25 mg, three tablets in the morning and three at night, and is diligent about her medication regimen.  She has a history of extensive alcohol use, consuming about seven to eight drinks daily, typically wine or vodka mixed with Crystal Light. She has not experienced withdrawal symptoms in the past, even during a hospital stay for a back fracture.  She underwent cataract surgery last year, which has improved her vision significantly, allowing her to read again. She also reports occasional dehydration headaches, which are relieved by drinking water.  Review of Systems - Negative except symptoms are in HPI    Objective   Current Meds  Medication Sig   acetaminophen  (TYLENOL ) 500 MG tablet Take 500 mg by mouth every 6 (six) hours as needed.   benazepril -hydrochlorthiazide (LOTENSIN  HCT) 20-25  MG tablet TAKE 1 TABLET BY MOUTH DAILY.   carvedilol  (COREG ) 6.25 MG tablet Take 3 tab in morning(18.75 mg)  BID   Cholecalciferol (VITAMIN D3) 125 MCG (5000 UT) TABS Take 3 tablets (15,000 Units total) by mouth See admin instructions. Takes 15000 units 5 days a week and 20000 units on 2 days. (Patient taking differently: Take 15,000 Units by mouth  See admin instructions. Takes 15,000 units 5 days a week and 20,000 units on 2 days.)   Cyanocobalamin  (VITAMIN B-12) 2500 MCG SUBL Place 2,500 mcg under the tongue once a week.   cyclobenzaprine  (FLEXERIL ) 10 MG tablet TAKE 1 TABLET BY MOUTH AS NEEDED UP TO ONCE DAILY FOR ACUTE MUSCLE SPASMS.   diphenhydrAMINE  (BENADRYL ) 25 MG tablet Take 25 mg by mouth daily as needed (Allergies).   ferrous sulfate  325 (65 FE) MG tablet Take 1 tablet (325 mg total) by mouth daily with breakfast.   furosemide  (LASIX ) 20 MG tablet Take 1 tablet (20 mg total) by mouth as needed. Take and additional if needed for fluid   gabapentin  (NEURONTIN ) 300 MG capsule TAKE 4 CAPSULES BY MOUTH EVERY MORNING, 3 CAPSULES IN THE AFTERNOON, AND 4 CAPSULES AT NIGHT.   levothyroxine  (SYNTHROID ) 125 MCG tablet TAKE 1 TABLET (125 MCG TOTAL) BY MOUTH ONCE DAILY EVERY MORNING BEFORE BREAKFAST   LORazepam  (ATIVAN ) 2 MG tablet Take 1 tablet 1 hour prior to flying   meloxicam  (MOBIC ) 15 MG tablet TAKE 1 TABLET BY MOUTH DAILY   ondansetron  (ZOFRAN ) 4 MG tablet Take 1 tablet (4 mg total) by mouth every 8 (eight) hours as needed for nausea or vomiting.   pantoprazole  (PROTONIX ) 40 MG tablet TAKE 1 TABLET (40 MG TOTAL) BY MOUTH DAILY.   rosuvastatin  (CRESTOR ) 5 MG tablet TAKE 1 TABLET (5 MG TOTAL) BY MOUTH DAILY.   traMADol  (ULTRAM ) 50 MG tablet Take 1 tablet (50 mg total) by mouth every 12 (twelve) hours as needed.   traZODone  (DESYREL ) 50 MG tablet Take 0.5-1 tablets (25-50 mg total) by mouth at bedtime as needed. for sleep     Studies Reviewed: SABRA        Lab Results  Component Value Date   CHOL 169 03/31/2024   HDL 62 03/31/2024   LDLCALC 84 03/31/2024   LDLDIRECT 74 08/18/2021   TRIG 134 03/31/2024   CHOLHDL 2.7 03/31/2024   Lab Results  Component Value Date   NA 132 (L) 04/04/2024   K 4.4 04/04/2024   CREATININE 0.89 04/04/2024   EGFR 67 04/04/2024   GLUCOSE 87 04/04/2024   Lab Results  Component Value Date   HGBA1C  5.6 03/31/2024    Results DIAGNOSTIC Echocardiogram: Ejection Fraction (EF) 60-65%, no regional wall motion abnormalities (RWMA), mild left ventricular hypertrophy (LVH), normal pulmonary artery (PA) pressures, moderate regurgitation, suggestively elevated filling pressures, aortic valve calcification, dilated thoracic aorta 40 mm, mildly elevated right atrial pressure (RAP) 8 mmHg (08/21/2023)   Risk Assessment/Calculations:        Physical Exam:   VS:  BP (!) 144/84   Pulse 61   Ht 5' 5.5 (1.664 m)   Wt 234 lb (106.1 kg)   SpO2 96%   BMI 38.35 kg/m    Wt Readings from Last 3 Encounters:  08/19/24 234 lb (106.1 kg)  04/11/24 232 lb 6.4 oz (105.4 kg)  02/22/24 227 lb (103 kg)    Physical Exam CARDIOVASCULAR: Blood pressure elevated. Heart murmur present. Aortic sclerosis without stenosis. MUSCULOSKELETAL: No clubbing, cyanosis, or edema. Normal pedal  pulses.   GEN: Well nourished, well groomed in no acute distress; morbidly obese  NECK: No JVD; No carotid bruits CARDIAC: Normal S1, S2; RRR, 2-3/6 SEM @ RUSB murmurs, rubs, gallops RESPIRATORY:  Clear to auscultation without rales, wheezing or rhonchi ; nonlabored, good air movement. ABDOMEN: Soft, non-tender, non-distended EXTREMITIES: No clubbing/cyanosis.  ~ l>r lle eDEMA ~ 1-2+ (compression stockings on);  No deformity      ASSESSMENT AND PLAN: .    Problem List Items Addressed This Visit       Cardiology Problems   Chronic heart failure with preserved ejection fraction (HFpEF) (HCC) - Primary (Chronic)   Preserved EF with intermittent dyspnea symptoms.   No real PND or orthopnea. Treat blood pressure with carvedilol  12.5 mg twice daily and combination of benazepril  and HCTZ 20 over 25 mg daily  Doing well.  For the most part blood pressure is usually very well-controlled but not today.  Symptoms well-controlled.      Relevant Medications   carvedilol  (COREG ) 6.25 MG tablet   furosemide  (LASIX ) 20 MG tablet    Chronic venous stasis dermatitis of both lower extremities (Chronic)   Relevant Medications   carvedilol  (COREG ) 6.25 MG tablet   furosemide  (LASIX ) 20 MG tablet   Essential hypertension (Chronic)   Relevant Medications   carvedilol  (COREG ) 6.25 MG tablet   furosemide  (LASIX ) 20 MG tablet   Mixed hyperlipidemia (Chronic)   Relevant Medications   carvedilol  (COREG ) 6.25 MG tablet   furosemide  (LASIX ) 20 MG tablet   Senile calcific aortic valve sclerosis (Chronic)   Relevant Medications   carvedilol  (COREG ) 6.25 MG tablet   furosemide  (LASIX ) 20 MG tablet     Other   Morbid obesity (HCC) (Chronic)    Assessment and Plan   Assessment & Plan Chronic venous insufficiency with lower extremity edema -> symptoms of edema more consistent with venous insufficiency and CHF. Chronic venous insufficiency with lower extremity edema, exacerbated by prolonged sitting. Poor response to Lasix  previously. - Advise to elevate feet for 20-30 minutes before sleeping. - Instruct to take two Lasix  tablets if truncal edema occurs.  Hypertension Hypertension with blood pressure in the 150s/80s. Current regimen includes carvedilol  6.25 mg, three tablets twice daily. Discussed potential need for additional carvedilol  if blood pressure remains elevated. - Advise to take an extra 6.25 mg of carvedilol  if blood pressure remains in the 150s or higher.  Alcohol use disorder, chronic Chronic alcohol use disorder with 7-8 drinks per day. Discussed potential long-term effects on liver and heart health. Aware of risks but continues current consumption pattern.  Aortic valve sclerosis Aortic valve sclerosis without stenosis. No symptoms reported. Murmur noted in the aortic position.  Dilated thoracic aorta Dilated thoracic aorta measuring 40 mm. No acute symptoms reported.  Obesity Obesity with recent weight gain. Aware of the need to reduce weight before upcoming travel. - Advise to avoid prolonged sitting  with feet dependent. - Encourage weight reduction efforts before travel.  Recording duration: 23 minutes           Follow-Up: Return in about 1 year (around 08/19/2025).     Signed, Alm MICAEL Clay, MD, MS Alm Clay, M.D., M.S. Interventional Cardiologist  [facility] Pager # [phone]

## 2024-08-27 ENCOUNTER — Encounter: Payer: Self-pay | Admitting: Cardiology

## 2024-08-27 NOTE — Assessment & Plan Note (Signed)
 Preserved EF with intermittent dyspnea symptoms.   No real PND or orthopnea. Treat blood pressure with carvedilol  12.5 mg twice daily and combination of benazepril  and HCTZ 20 over 25 mg daily  Doing well.  For the most part blood pressure is usually very well-controlled but not today.  Symptoms well-controlled.

## 2024-08-27 NOTE — Progress Notes (Incomplete)
 Cardiology Office Note:  .   Date:  08/28/2024  ID:  [name], [name] Oct 16, 1946, MRN [id number] PCP: Chandra Toribio POUR, MD  [facility] Providers Cardiologist:  Alm Clay, MD { Click to update primary MD,subspecialty MD or APP then REFRESH:1}    Chief Complaint  Patient presents with  . Cardiac Valve Problem  . Follow-up    Doing fairly well.    Patient Profile: .     [name] is a very sedentary morbidly obese 78 y.o. female heavy alcohol drinker with a PMH noted below who presents here for 3-month follow-up Assistant PCP Wallace Joesph LABOR, PA.  [name] is a ~morbidly obese 78 y.o. female with a PMH noted below who presents here for 1 month virtual follow-up at the request of Wallace Joesph LABOR, PA.  PMH: Chronic HFpEF w/ chronic LE edema complicated by venous insufficiency HTN, HLD Obesity-history of GOP surgery History of EtOH abuse -> currently drinking vodka as opposed to significant wine.  She also does drink wine. History of GI bleed        [name] was last seen by [name] Braver, NP on 02/02/2024 as usual accompanied by her husband.  Stable chronic exertional dyspnea.  No chest pain.  Stable nonpitting bilateral lower extremity edema improved with compression and elevation.  Had not yet required Lasix  for a month.  Still drinking heavy amounts of alcohol.  BP stable but elevated in the office.  Overall improved with carvedilol  no changes made.  Subjective  Discussed the use of AI scribe software for clinical note transcription with the patient, who gave verbal consent to proceed.  History of Present Illness [name] is a 78 year old female with chronic venous insufficiency who presents for a six-month follow-up.  She has chronic venous insufficiency with persistent edema primarily affecting her lower legs. The swelling is generally well-managed but worsens when she falls asleep sitting up with her feet  dependent for several hours. Elevating her feet before sleep improves the swelling. She does not frequently use Lasix  due to poor response in the past, and only considers it when experiencing truncal edema.  An echocardiogram performed on August 20, 2024, showed an ejection fraction of 60-65%, mild left ventricular hypertrophy, normal pulmonary artery pressures, moderate regurgitation, mildly elevated right atrial pressure, and a dilated thoracic aorta measuring 40 millimeters.  She uses two canes for mobility due to a spinal cord injury and reports that walking can be tiring, particularly over long distances. No shortness of breath or chest pain during these activities.  Her blood pressure at home typically runs in the 130s to 150s over low 80s, depending on the time of day and activity level. She is currently taking carvedilol  6.25 mg, three tablets in the morning and three at night, and is diligent about her medication regimen.  She has a history of extensive alcohol use, consuming about seven to eight drinks daily, typically wine or vodka mixed with Crystal Light. She has not experienced withdrawal symptoms in the past, even during a hospital stay for a back fracture.  She underwent cataract surgery last year, which has improved her vision significantly, allowing her to read again. She also reports occasional dehydration headaches, which are relieved by drinking water.  Review of Systems - Negative except symptoms are in HPI    Objective   Current Meds  Medication Sig  . acetaminophen  (TYLENOL ) 500 MG tablet Take 500 mg by mouth every 6 (  six) hours as needed.  . benazepril -hydrochlorthiazide (LOTENSIN  HCT) 20-25 MG tablet TAKE 1 TABLET BY MOUTH DAILY.  . carvedilol  (COREG ) 6.25 MG tablet Take 3 tab in morning(18.75 mg)  BID  . Cholecalciferol (VITAMIN D3) 125 MCG (5000 UT) TABS Take 3 tablets (15,000 Units total) by mouth See admin instructions. Takes 15000 units 5 days a week and 20000  units on 2 days. (Patient taking differently: Take 15,000 Units by mouth See admin instructions. Takes 15,000 units 5 days a week and 20,000 units on 2 days.)  . Cyanocobalamin  (VITAMIN B-12) 2500 MCG SUBL Place 2,500 mcg under the tongue once a week.  . cyclobenzaprine  (FLEXERIL ) 10 MG tablet TAKE 1 TABLET BY MOUTH AS NEEDED UP TO ONCE DAILY FOR ACUTE MUSCLE SPASMS.  . diphenhydrAMINE  (BENADRYL ) 25 MG tablet Take 25 mg by mouth daily as needed (Allergies).  . ferrous sulfate  325 (65 FE) MG tablet Take 1 tablet (325 mg total) by mouth daily with breakfast.  . furosemide  (LASIX ) 20 MG tablet Take 1 tablet (20 mg total) by mouth as needed. Take and additional if needed for fluid  . gabapentin  (NEURONTIN ) 300 MG capsule TAKE 4 CAPSULES BY MOUTH EVERY MORNING, 3 CAPSULES IN THE AFTERNOON, AND 4 CAPSULES AT NIGHT.  . levothyroxine  (SYNTHROID ) 125 MCG tablet TAKE 1 TABLET (125 MCG TOTAL) BY MOUTH ONCE DAILY EVERY MORNING BEFORE BREAKFAST  . LORazepam  (ATIVAN ) 2 MG tablet Take 1 tablet 1 hour prior to flying  . meloxicam  (MOBIC ) 15 MG tablet TAKE 1 TABLET BY MOUTH DAILY  . ondansetron  (ZOFRAN ) 4 MG tablet Take 1 tablet (4 mg total) by mouth every 8 (eight) hours as needed for nausea or vomiting.  . pantoprazole  (PROTONIX ) 40 MG tablet TAKE 1 TABLET (40 MG TOTAL) BY MOUTH DAILY.  . rosuvastatin  (CRESTOR ) 5 MG tablet TAKE 1 TABLET (5 MG TOTAL) BY MOUTH DAILY.  . traMADol  (ULTRAM ) 50 MG tablet Take 1 tablet (50 mg total) by mouth every 12 (twelve) hours as needed.  . traZODone  (DESYREL ) 50 MG tablet Take 0.5-1 tablets (25-50 mg total) by mouth at bedtime as needed. for sleep     Studies Reviewed: SABRA        Lab Results  Component Value Date   CHOL 169 03/31/2024   HDL 62 03/31/2024   LDLCALC 84 03/31/2024   LDLDIRECT 74 08/18/2021   TRIG 134 03/31/2024   CHOLHDL 2.7 03/31/2024   Lab Results  Component Value Date   NA 132 (L) 04/04/2024   K 4.4 04/04/2024   CREATININE 0.89 04/04/2024   EGFR 67  04/04/2024   GLUCOSE 87 04/04/2024   Lab Results  Component Value Date   HGBA1C 5.6 03/31/2024    Results DIAGNOSTIC Echocardiogram: Ejection Fraction (EF) 60-65%, no regional wall motion abnormalities (RWMA), mild left ventricular hypertrophy (LVH), normal pulmonary artery (PA) pressures, moderate regurgitation, suggestively elevated filling pressures, aortic valve calcification, dilated thoracic aorta 40 mm, mildly elevated right atrial pressure (RAP) 8 mmHg (08/21/2023)   Risk Assessment/Calculations:        Physical Exam:   VS:  BP (!) 144/84   Pulse 61   Ht 5' 5.5 (1.664 m)   Wt 234 lb (106.1 kg)   SpO2 96%   BMI 38.35 kg/m    Wt Readings from Last 3 Encounters:  08/19/24 234 lb (106.1 kg)  04/11/24 232 lb 6.4 oz (105.4 kg)  02/22/24 227 lb (103 kg)    Physical Exam CARDIOVASCULAR: Blood pressure elevated. Heart murmur present. Aortic sclerosis  without stenosis. MUSCULOSKELETAL: No clubbing, cyanosis, or edema. Normal pedal pulses.   GEN: Well nourished, well groomed in no acute distress; morbidly obese  NECK: No JVD; No carotid bruits CARDIAC: Normal S1, S2; RRR, 2-3/6 SEM @ RUSB murmurs, rubs, gallops RESPIRATORY:  Clear to auscultation without rales, wheezing or rhonchi ; nonlabored, good air movement. ABDOMEN: Soft, non-tender, non-distended EXTREMITIES: No clubbing/cyanosis.  ~ l>r lle eDEMA ~ 1-2+ (compression stockings on);  No deformity      ASSESSMENT AND PLAN: .    Problem List Items Addressed This Visit       Cardiology Problems   Chronic heart failure with preserved ejection fraction (HFpEF) (HCC) - Primary (Chronic)   Preserved EF with intermittent dyspnea symptoms.   No real PND or orthopnea. Treat blood pressure with carvedilol  12.5 mg twice daily and combination of benazepril  and HCTZ 20 over 25 mg daily  Doing well.  For the most part blood pressure is usually very well-controlled but not today.  Symptoms well-controlled.      Relevant  Medications   carvedilol  (COREG ) 6.25 MG tablet   furosemide  (LASIX ) 20 MG tablet   Chronic venous stasis dermatitis of both lower extremities (Chronic)   Relevant Medications   carvedilol  (COREG ) 6.25 MG tablet   furosemide  (LASIX ) 20 MG tablet   Essential hypertension (Chronic)   Relevant Medications   carvedilol  (COREG ) 6.25 MG tablet   furosemide  (LASIX ) 20 MG tablet   Mixed hyperlipidemia (Chronic)   Relevant Medications   carvedilol  (COREG ) 6.25 MG tablet   furosemide  (LASIX ) 20 MG tablet   Senile calcific aortic valve sclerosis (Chronic)   Relevant Medications   carvedilol  (COREG ) 6.25 MG tablet   furosemide  (LASIX ) 20 MG tablet     Other   Morbid obesity (HCC) (Chronic)    Assessment and Plan Assessment & Plan Chronic venous insufficiency with lower extremity edema -> symptoms of edema more consistent with venous insufficiency and CHF. Chronic venous insufficiency with lower extremity edema, exacerbated by prolonged sitting. Poor response to Lasix  previously. - Advise to elevate feet for 20-30 minutes before sleeping. - Instruct to take two Lasix  tablets if truncal edema occurs.  Hypertension Hypertension with blood pressure in the 150s/80s. Current regimen includes carvedilol  6.25 mg, three tablets twice daily. Discussed potential need for additional carvedilol  if blood pressure remains elevated. - Advise to take an extra 6.25 mg of carvedilol  if blood pressure remains in the 150s or higher.  Alcohol use disorder, chronic Chronic alcohol use disorder with 7-8 drinks per day. Discussed potential long-term effects on liver and heart health. Aware of risks but continues current consumption pattern.  Aortic valve sclerosis Aortic valve sclerosis without stenosis. No symptoms reported. Murmur noted in the aortic position.  Dilated thoracic aorta Dilated thoracic aorta measuring 40 mm. No acute symptoms reported.  Obesity Obesity with recent weight gain. Aware of the  need to reduce weight before upcoming travel. - Advise to avoid prolonged sitting with feet dependent. - Encourage weight reduction efforts before travel.  Recording duration: 23 minutes           Follow-Up: Return in about 1 year (around 08/19/2025).     Signed, Alm MICAEL Clay, MD, MS Alm Clay, M.D., M.S. Interventional Cardiologist  [facility] Pager # [phone]

## 2024-09-15 ENCOUNTER — Other Ambulatory Visit: Payer: Self-pay | Admitting: Sports Medicine

## 2024-09-15 DIAGNOSIS — M170 Bilateral primary osteoarthritis of knee: Secondary | ICD-10-CM

## 2024-09-15 NOTE — Telephone Encounter (Signed)
 Spoke with patient. Scheduled with Dr. Charles tomorrow at 10:10am - she will request Meloxicam  refill from his office .

## 2024-09-15 NOTE — Telephone Encounter (Signed)
 Left message for a call back.

## 2024-09-15 NOTE — Telephone Encounter (Signed)
 Copied from CRM #[id number]. Topic: Clinical - Medication Question >> Sep 15, 2024  9:04 AM Tonda B wrote: Reason for CRM: patient is calling in she has question about her  Rx meloxicam  (MOBIC ) 15 MG tablet [ patient says she do not want to have to come in and see daniel olson to get her medication she was a pt with [facility] when she got the rx and she has questions about her rx getting filled please call pt back [phone] (H) [phone] (M

## 2024-09-16 ENCOUNTER — Ambulatory Visit (HOSPITAL_BASED_OUTPATIENT_CLINIC_OR_DEPARTMENT_OTHER)
Admission: RE | Admit: 2024-09-16 | Discharge: 2024-09-16 | Disposition: A | Discharge status: Home / Self Care | Source: Ambulatory Visit

## 2024-09-16 ENCOUNTER — Ambulatory Visit: Payer: Self-pay

## 2024-09-16 ENCOUNTER — Ambulatory Visit

## 2024-09-16 VITALS — BP 130/80 | Ht 65.5 in | Wt 234.0 lb

## 2024-09-16 DIAGNOSIS — Z8711 Personal history of peptic ulcer disease: Secondary | ICD-10-CM

## 2024-09-16 DIAGNOSIS — M170 Bilateral primary osteoarthritis of knee: Secondary | ICD-10-CM | POA: Diagnosis present

## 2024-09-16 DIAGNOSIS — Z9181 History of falling: Secondary | ICD-10-CM | POA: Diagnosis not present

## 2024-09-16 DIAGNOSIS — M792 Neuralgia and neuritis, unspecified: Secondary | ICD-10-CM

## 2024-09-16 NOTE — Patient Instructions (Signed)
 Osteoarthritis treatment options  Physical therapy/home exercises topical anti-inflammatories (Voltaren  gel) Topical treatments that are not anti-inflammatory (Biofreeze, etc.)  oral anti-inflammatories (ibuprofen, meloxicam , etc.) Joint injections Corticosteroids NSAIDs hyaluronic acid  platelet rich plasma prolotherapy genicular nerve block/ablations And ultimately...joint replacement.

## 2024-09-16 NOTE — Progress Notes (Signed)
 Subjective:    Patient ID: [name], female    DOB: 78 y.o., 07/23/1946   MRN: [id number]  Chief Complaint: bilateral knee pain  Discussed the use of AI scribe software for clinical note transcription with the patient, who gave verbal consent to proceed.  History of Present Illness [name] is a 78 year old female with osteoarthritis who presents for management of knee pain and medication refill. She is accompanied by her husband, [name]. She was referred by Dr. ONEIDA for continuation of care after his departure from the practice.  Chronic knee pain - Chronic bilateral knee pain, with the left knee more symptomatic since at least 2017 - Pain described as 'bone on bone' - No current joint effusion; prior aspirations and x-rays performed for both knees - Pain management includes meloxicam  taken after meals, tramadol  occasionally up to twice daily, and topical Voltaren  gel and salicylate cream, especially in the morning, which provide some relief  Medication-related gastrointestinal complications - History of hemorrhagic ulcer secondary to long-term high-dose ibuprofen use - Transitioned to meloxicam  for pain management following ulcer development  Peripheral neuropathy and falls - History of falls attributed to neuropathy - Improved lower extremity sensation and reduced fall frequency with treatment - Increased hydration has contributed to decreased fall frequency  Functional status and activity level - Retired ICU nurse - Remains active, enjoys traveling and walking long distances on cruises  Review of pertinent imaging: 4 view plain film radiographs obtained at 12/18/2017 revealing mild tricompartmental degenerative changes. 4 view plain film radiographs obtained on 11/02/2016 revealing similar mild tricompartmental degenerative changes (greater in the medial compartment in the lateral compartment)     Objective:   Vitals:    09/16/24 1021  BP: 130/80   Left knee: There is swelling and some visible effusion present. No overlying erythema Tenderness palpation along the medial joint line. Nontender along lateral joint line, patella, patellar tendon 1+ laxity without pain with valgus stress Stable and painless with varus stress Negative anterior drawer.  Right knee: There is swelling and some visible effusion present No overlying erythema Tenderness to palpation along the medial and lateral joint lines. Nontender over the patella, patellar tendon 1+ laxity without pain with valgus stress 1+ laxity without pain with varus stress Negative anterior drawer     Assessment & Plan:   Assessment & Plan Bilateral primary osteoarthritis of knees   Chronic bilateral knee pain is due to osteoarthritis with significant cartilage wear noted on previous x-rays. Pain worsens with activity and is managed with meloxicam , tramadol , and topical treatments. Due to a history of bleeding gastric ulcers, I have significant concerns about long-term oral NSAID use. I discussed with the patient the nature of osteoarthritis their various options available for treating this including physical therapy/home exercises, topical anti-inflammatories, oral anti-inflammatories, intra-articular injections (corticosteroids, NSAIDs, hyaluronic acid, platelet rich plasma, prolotherapy), genicular nerve block/ablations, & ultimately joint replacement. Bilateral knee x-rays are ordered to assess current osteoarthritis status. Increase Voltaren  gel to four times daily and discontinue meloxicam  after the current supply is finished (6 days hence).  We will follow-up in 2 weeks to assess treatment response and consider one of the above interventions.  Hemorrhagic gastric ulcer   The gastric ulcer is likely related to long-term high-dose ibuprofen use. Current meloxicam  use poses a risk of gastrointestinal bleeding. Avoid oral NSAIDs to prevent ulcer  recurrence.  Peripheral neuropathy   Peripheral neuropathy previously caused significant sensory loss in her feet, leading to cessation of  driving. Current treatment has improved sensation and reduced fall risk.  Falls   Falls were previously attributed to peripheral neuropathy and possible dehydration. Improvement is noted with current neuropathy treatment and hydration. Potential fall risk with tramadol  use was discussed.

## 2024-09-29 ENCOUNTER — Other Ambulatory Visit: Payer: Self-pay | Admitting: Family Medicine

## 2024-09-29 DIAGNOSIS — E039 Hypothyroidism, unspecified: Secondary | ICD-10-CM

## 2024-09-29 DIAGNOSIS — I10 Essential (primary) hypertension: Secondary | ICD-10-CM

## 2024-09-29 DIAGNOSIS — E782 Mixed hyperlipidemia: Secondary | ICD-10-CM

## 2024-09-30 ENCOUNTER — Ambulatory Visit (HOSPITAL_BASED_OUTPATIENT_CLINIC_OR_DEPARTMENT_OTHER)
Admission: RE | Admit: 2024-09-30 | Discharge: 2024-09-30 | Disposition: A | Discharge status: Home / Self Care | Source: Ambulatory Visit

## 2024-09-30 ENCOUNTER — Ambulatory Visit

## 2024-09-30 VITALS — BP 142/78

## 2024-09-30 DIAGNOSIS — M542 Cervicalgia: Secondary | ICD-10-CM | POA: Insufficient documentation

## 2024-09-30 DIAGNOSIS — M79631 Pain in right forearm: Secondary | ICD-10-CM | POA: Insufficient documentation

## 2024-09-30 DIAGNOSIS — Z9181 History of falling: Secondary | ICD-10-CM

## 2024-09-30 NOTE — Progress Notes (Signed)
 Subjective:    Patient ID: [name], female    DOB: 79 y.o., 09/25/1946   MRN: [id number]  Chief Complaint: Neck pain.  Right forearm pain.  Discussed the use of AI scribe software for clinical note transcription with the patient, who gave verbal consent to proceed.  History of Present Illness [name] is a 78 year old female with a history of spinal surgery who presents with neck, hip, and lower back pain.  Cervicalgia (neck pain) - Severe, persistent neck pain significantly impairs mobility - Pain worsens with movement, especially turning to the right - Pain is currently localized without radiation to the arms - Nerve block and ablation procedures provided temporary relief - Epidural injections were previously helpful when experiencing arm numbness - No recent imaging of the neck since spinal surgery  Lumbago and hip pain - Severe pain in the hips and lower back significantly impairs mobility - Requires use of canes for ambulation  Analgesic use and adverse effects - Voltaren  gel provides minimal relief - Meloxicam  was effective but discontinued due to gastric ulcer concerns - Ibuprofen 800 mg was used four times daily in the past  Post-spinal surgery status - History of spinal surgery four and a half years ago for a rapidly swelling spinal cord and bone fragments  C-spine MRI from 05/26/2020 revealing a large extruded disc fragment to the left of midline at C7-T1 with cord compression.     Objective:   Vitals:   09/30/24 1330  BP: (!) 142/78    Cervical Spine -Inspection: no swelling or skin changes.  Forward head carriage. -Palpation: TTP + paraspinals (most prominent on the right), equivocal midline tenderness around C7 spinous process. -AROM/PROM: FROM in all planes of the neck -Strength: full neck flexion/extension (C1/C2), neck sidebending (C3), shoulder shrug (C4), shoulder abduction (C5), elbow flexion (C6), elbow extension (C7), thumb  extension (C8/radial), finger abduction (T1/ulnar), OK sign (median) -Sensation: intact sensation of the thumb (C6), 2nd/3rd digits (C7), 4th/5th digits (C8). -Reflexes: normal biceps (C5/6), brachioradialis (C5/6), triceps (C7/8) reflexes, equal bilaterally -Special tests: Tornado test produces significant pain in her neck (most prominent near the lower right neck) but no radiation of pain down either extremity.     Assessment & Plan:   Assessment & Plan Cervical spondylosis with neck pain   She experiences chronic neck pain, worsening on the right side without arm radiation. Previous interventions include epidural injections. Current symptoms suggest significant arthritis or a disc bulge affecting the spinal cord. No recent imaging since an MRI scan four and a half years ago and the findings and the seem to suggest more of a radicular type pain pattern which she is not exhibiting at the moment. Ordered cervical spine x-rays to assess for arthritis and alignment. Advanced imaging (CT or MRI) will be considered based on x-ray findings and symptom severity. Continue Voltaren  gel for pain management. Avoid meloxicam  unless symptoms become unmanageable with Voltaren  gel.  Right arm pain and swelling, status post open reduction and internal fixation   She has chronic right arm pain and swelling with significant hardware placement. Concerns exist about potential hardware issues or additional fractures. Previous imaging showed bone fragments post-fall. No recent imaging since the initial surgery. Ordered an x-ray of the right arm to assess for hardware issues or additional fractures. Follow-up with the original surgeon will be considered based on x-ray findings.  Chronic low back pain   Her low back pain is exacerbated by prolonged standing and affects  mobility. Previous surgical intervention for spinal issues. Current management includes Voltaren  gel and meloxicam , though meloxicam  is not preferred  long-term due to potential side effects. Continue Voltaren  gel for pain management.  Refill provided for meloxicam  for now but recommend avoiding whenever possible.   Greater than 30 minutes of time were spent in face-to-face interaction with the patient, reviewing her prior imaging, and discussing next steps in management for her.

## 2024-10-01 ENCOUNTER — Other Ambulatory Visit: Payer: Self-pay | Admitting: Family Medicine

## 2024-10-02 ENCOUNTER — Ambulatory Visit: Payer: Self-pay

## 2024-10-02 ENCOUNTER — Other Ambulatory Visit: Payer: Self-pay

## 2024-10-02 DIAGNOSIS — M542 Cervicalgia: Secondary | ICD-10-CM

## 2024-10-06 ENCOUNTER — Other Ambulatory Visit

## 2024-10-06 ENCOUNTER — Other Ambulatory Visit: Payer: Self-pay | Admitting: Medical Genetics

## 2024-10-06 DIAGNOSIS — I10 Essential (primary) hypertension: Secondary | ICD-10-CM

## 2024-10-06 DIAGNOSIS — E039 Hypothyroidism, unspecified: Secondary | ICD-10-CM

## 2024-10-06 DIAGNOSIS — E782 Mixed hyperlipidemia: Secondary | ICD-10-CM

## 2024-10-07 ENCOUNTER — Ambulatory Visit: Payer: Self-pay | Admitting: Family Medicine

## 2024-10-07 LAB — CBC WITH DIFFERENTIAL/PLATELET
Basophils Absolute: 0.1 x10E3/uL (ref 0.0–0.2)
Basos: 2 %
EOS (ABSOLUTE): 0.4 x10E3/uL (ref 0.0–0.4)
Eos: 8 %
Hematocrit: 39.9 % (ref 34.0–46.6)
Hemoglobin: 13 g/dL (ref 11.1–15.9)
Immature Grans (Abs): 0 x10E3/uL (ref 0.0–0.1)
Immature Granulocytes: 0 %
Lymphocytes Absolute: 2.2 x10E3/uL (ref 0.7–3.1)
Lymphs: 40 %
MCH: 34.5 pg — ABNORMAL HIGH (ref 26.6–33.0)
MCHC: 32.6 g/dL (ref 31.5–35.7)
MCV: 106 fL — ABNORMAL HIGH (ref 79–97)
Monocytes Absolute: 0.5 x10E3/uL (ref 0.1–0.9)
Monocytes: 9 %
Neutrophils Absolute: 2.2 x10E3/uL (ref 1.4–7.0)
Neutrophils: 41 %
Platelets: 228 x10E3/uL (ref 150–450)
RBC: 3.77 x10E6/uL (ref 3.77–5.28)
RDW: 12.2 % (ref 11.7–15.4)
WBC: 5.4 x10E3/uL (ref 3.4–10.8)

## 2024-10-07 LAB — COMPREHENSIVE METABOLIC PANEL WITH GFR
ALT: 11 IU/L (ref 0–32)
AST: 17 IU/L (ref 0–40)
Albumin: 4.1 g/dL (ref 3.8–4.8)
Alkaline Phosphatase: 97 IU/L (ref 49–135)
BUN/Creatinine Ratio: 20 (ref 12–28)
BUN: 17 mg/dL (ref 8–27)
Bilirubin Total: 0.6 mg/dL (ref 0.0–1.2)
CO2: 25 mmol/L (ref 20–29)
Calcium: 9.5 mg/dL (ref 8.7–10.3)
Chloride: 101 mmol/L (ref 96–106)
Creatinine, Ser: 0.85 mg/dL (ref 0.57–1.00)
Globulin, Total: 2.5 g/dL (ref 1.5–4.5)
Glucose: 91 mg/dL (ref 70–99)
Potassium: 4.7 mmol/L (ref 3.5–5.2)
Sodium: 139 mmol/L (ref 134–144)
Total Protein: 6.6 g/dL (ref 6.0–8.5)
eGFR: 71 mL/min/1.73 (ref >59)

## 2024-10-07 LAB — TSH: TSH: 2.76 u[IU]/mL (ref 0.450–4.500)

## 2024-10-07 LAB — LIPID PANEL
Chol/HDL Ratio: 3 ratio (ref 0.0–4.4)
Cholesterol, Total: 150 mg/dL (ref 100–199)
HDL: 50 mg/dL (ref >39)
LDL Chol Calc (NIH): 73 mg/dL (ref 0–99)
Triglycerides: 157 mg/dL — ABNORMAL HIGH (ref 0–149)
VLDL Cholesterol Cal: 27 mg/dL (ref 5–40)

## 2024-10-07 LAB — HEMOGLOBIN A1C
Est. average glucose Bld gHb Est-mCnc: 111 mg/dL
Hgb A1c MFr Bld: 5.5 % (ref 4.8–5.6)

## 2024-10-10 ENCOUNTER — Ambulatory Visit (HOSPITAL_COMMUNITY)
Admission: RE | Admit: 2024-10-10 | Discharge: 2024-10-10 | Disposition: A | Discharge status: Home / Self Care | Source: Ambulatory Visit

## 2024-10-10 DIAGNOSIS — M542 Cervicalgia: Secondary | ICD-10-CM | POA: Insufficient documentation

## 2024-10-13 ENCOUNTER — Ambulatory Visit (INDEPENDENT_AMBULATORY_CARE_PROVIDER_SITE_OTHER): Admitting: Family Medicine

## 2024-10-13 ENCOUNTER — Encounter: Payer: Self-pay | Admitting: Family Medicine

## 2024-10-13 VITALS — BP 170/92 | HR 64 | Temp 97.7°F | Ht 65.5 in | Wt 228.1 lb

## 2024-10-13 DIAGNOSIS — Z5181 Encounter for therapeutic drug level monitoring: Secondary | ICD-10-CM

## 2024-10-13 DIAGNOSIS — M170 Bilateral primary osteoarthritis of knee: Secondary | ICD-10-CM

## 2024-10-13 DIAGNOSIS — E782 Mixed hyperlipidemia: Secondary | ICD-10-CM

## 2024-10-13 DIAGNOSIS — E039 Hypothyroidism, unspecified: Secondary | ICD-10-CM | POA: Diagnosis not present

## 2024-10-13 DIAGNOSIS — Z23 Encounter for immunization: Secondary | ICD-10-CM | POA: Diagnosis not present

## 2024-10-13 DIAGNOSIS — I10 Essential (primary) hypertension: Secondary | ICD-10-CM | POA: Diagnosis not present

## 2024-10-13 DIAGNOSIS — Z791 Long term (current) use of non-steroidal anti-inflammatories (NSAID): Secondary | ICD-10-CM

## 2024-10-13 MED ORDER — MELOXICAM 15 MG PO TABS: 15.0000 mg | ORAL_TABLET | Freq: Every day | ORAL | 1 refills | Status: AC

## 2024-10-13 NOTE — Progress Notes (Signed)
 Established Patient Office Visit  Subjective   Patient ID: [name], female    DOB: Sep 28, 1946  Age: 78 y.o. MRN: [id number]  Chief Complaint  Patient presents with   Medical Management of Chronic Issues    HPI  Subjective - Reports being miserable due to increased pain, primarily in the neck, lower back, and hips. Pain is secondary to extensive osteoarthritis, confirmed on recent X-rays and a neck MRI last week. - States meloxicam  15 mg was effective for pain control, allowing for mobility. Previous regimen was one tramadol , one Tylenol , and one meloxicam  in the morning. - New sports medicine specialist discontinued meloxicam  due to history of hemorrhagic ulcer and recommended Voltaren  gel, which has been effective for knees but not for neck, back, or hips. - Reports increased alcohol intake as self-medication for pain over the past few weeks. Movement has been significantly reduced. Turning head is very painful. - Reports home blood pressure readings have been in the 130s-140s systolic over 70s-80s diastolic. - Reports good response to neuropathy treatments (gabapentin , electrostimulation) with improved sensation in feet and ability to drive. Has only fallen once in the past year. - A new bump on the back was noted by husband, described as getting bigger.  Medications Current medications include tramadol  50 mg once daily PRN (taking more frequently recently), Tylenol  500 mg once daily (sometimes a second dose in the evening), pantoprazole  daily, Lotensin  HCTZ (benazepril /hydrochlorothiazide ), carvedilol  18.75 mg (3 tablets) twice daily, and gabapentin . Meloxicam  15 mg was recently stopped by another provider. Takes furosemide  PRN for peripheral edema, but not often. Does not take ibuprofen due to risk of bleeding.  PMH, PSH, FH, Social Hx PMHx: Osteoarthritis (neck, back, hips, knees, hands), history of hemorrhagic ulcer (approx. age 21-65), hypertension, neuropathy,  allergy to sulfa drugs (Celebrex ). PSH: Nerve block in neck years ago. Nerve block during arm fracture repair. Knee aspiration about a year ago. Social Hx: Reports being a heavy drinker. Reports a healthy diet, cooks from scratch. Retired Insurance Underwriter. Activity is limited, mostly in the kitchen.  ROS Constitutional: Denies fever, chills. Reports pain. HEENT: Reports neck pain with movement. Cardiovascular: Denies chest pain. Reports elevated home blood pressure readings. Musculoskeletal: Reports significant pain in neck, lower back, hips. Voltaren  gel helps knee pain. Neurological: Reports peripheral neuropathy with improvement on treatment. Can feel feet.   The 10-year ASCVD risk score (Arnett DK, et al., 2019) is: 40.5%  Health Maintenance Due  Topic Date Due   COVID-19 Vaccine (9 - 2025-26 season) 07/28/2024   Medicare Annual Wellness (AWV)  10/15/2024      Objective:     BP (!) 170/92   Pulse 64   Temp 97.7 F (36.5 C) (Oral)   Ht 5' 5.5 (1.664 m)   Wt 228 lb 1.9 oz (103.5 kg)   SpO2 96%   BMI 37.38 kg/m    Physical Exam Gen: alert, oriented Pulm: no respiratory distress SKIN: Cyst noted on the back. Non-tender, mobile. No signs of inflammation or infection. Psych: pleasant affect   No results found for any visits on 10/13/24.      Assessment & Plan:   Essential hypertension Assessment & Plan: Home blood pressure readings are elevated, in the 130s-140s systolic range. Reports cardiologist (Dr. Anner) advised taking an extra carvedilol  on days BP is in the 150s. Last cardiology follow-up was about a month ago. Current regimen is carvedilol  18.75 mg BID and benazepril /HCTZ. - Will continue to monitor blood pressure at home and keep  a log. - Defer medication changes at this time pending more consistent home monitoring. - Discussed options of increasing carvedilol  to 25 mg BID or increasing benazepril /HCTZ dose from 20-25 to 40-25.  Pt wishes to hold off on  medication change today.    Primary osteoarthritis of both knees Assessment & Plan:  Presents with worsening pain in neck, back, and hips after discontinuing meloxicam . Recent MRI of the neck confirms severe osteoarthritis. Was previously well-controlled on meloxicam  15 mg daily, tramadol , and Tylenol . New provider stopped NSAID due to history of hemorrhagic ulcer, despite daily pantoprazole  use. Aware of and accepts the risks of GI bleeding with NSAID use. - Restart meloxicam  15 mg daily. - Referral to [facility] on [address] to see Dr. ONEIDA. - Continue tramadol  and Tylenol  as needed for pain. - Discussed Qutenza patch as a potential future option for neuropathy. Provided information.  Orders: -     Meloxicam ; Take 1 tablet (15 mg total) by mouth daily.  Dispense: 90 tablet; Refill: 1  Immunization due  Other orders -     Flu vaccine HIGH DOSE PF(Fluzone Trivalent)   Sebaceous cyst, back Noted by family member to be enlarging. Non-tender on exam. - Reassured that it is a benign cyst. - Advised to leave it alone unless it becomes painful, inflamed, or bothersome.  Return in about 6 months (around 04/12/2025) for physical.   I spent 34 min in the mgmt of this patient  Toribio MARLA Slain, MD

## 2024-10-13 NOTE — Assessment & Plan Note (Addendum)
 Home blood pressure readings are elevated, in the 130s-140s systolic range. Reports cardiologist (Dr. Anner) advised taking an extra carvedilol  on days BP is in the 150s. Last cardiology follow-up was about a month ago. Current regimen is carvedilol  18.75 mg BID and benazepril /HCTZ. - Will continue to monitor blood pressure at home and keep a log. - Defer medication changes at this time pending more consistent home monitoring. - Discussed options of increasing carvedilol  to 25 mg BID or increasing benazepril /HCTZ dose from 20-25 to 40-25.  Pt wishes to hold off on medication change today.

## 2024-10-13 NOTE — Assessment & Plan Note (Signed)
 Presents with worsening pain in neck, back, and hips after discontinuing meloxicam . Recent MRI of the neck confirms severe osteoarthritis. Was previously well-controlled on meloxicam  15 mg daily, tramadol , and Tylenol . New provider stopped NSAID due to history of hemorrhagic ulcer, despite daily pantoprazole  use. Aware of and accepts the risks of GI bleeding with NSAID use. - Restart meloxicam  15 mg daily. - Referral to [facility] on [address] to see Dr. ONEIDA. - Continue tramadol  and Tylenol  as needed for pain. - Discussed Qutenza patch as a potential future option for neuropathy. Provided information.

## 2024-10-13 NOTE — Patient Instructions (Signed)
 It was nice to see you today,  We addressed the following topics today: - Please restart your meloxicam  15mg  once daily with food. - I am sending a referral for you to see Dr. ONEIDA at [facility] on [facility]. Their office should contact you, but you can call them in a couple of weeks to follow up on the referral status if you have not heard from them. - Continue to monitor your blood pressure at home and keep a log. We will review this at your next follow-up before making any changes to your medications. - When you check your blood pressure, please also check your heart rate. - I have provided you with information on the Qutenza patch for your neuropathy. Please read it over and let me know if you would like a referral to [facility] to discuss it further. - We will schedule a follow-up visit in 6 months. Please let us  know if you have any issues before then.  Have a great day,  Rolan Slain, MD

## 2024-10-28 ENCOUNTER — Ambulatory Visit

## 2024-10-28 VITALS — BP 130/90 | Ht 65.5 in | Wt 228.0 lb

## 2024-10-28 DIAGNOSIS — M47812 Spondylosis without myelopathy or radiculopathy, cervical region: Secondary | ICD-10-CM | POA: Diagnosis not present

## 2024-10-28 DIAGNOSIS — M170 Bilateral primary osteoarthritis of knee: Secondary | ICD-10-CM

## 2024-10-28 DIAGNOSIS — M542 Cervicalgia: Secondary | ICD-10-CM

## 2024-10-28 NOTE — Progress Notes (Signed)
   Subjective:    Patient ID: [name], female    DOB: 78 y.o., [birth year]   MRN: [id number]  Chief Complaint: Cervical spine MRI follow-up  Discussed the use of AI scribe software for clinical note transcription with the patient, who gave verbal consent to proceed.  History of Present Illness [name] is a 78 year old female with spinal stenosis who presents for follow-up regarding her MRI results.  Neck pain associated with spinal stenosis - Persistent neck pain without radiation - Pain worsens by the end of the day, especially without rest - Meloxicam  15 mg provides good relief and allows her to cook and stay active with minimal discomfort - Uses tramadol  once daily with good effect and occasionally adds Tylenol  for breakthrough pain - No recent issues with the combination of tramadol  and Tylenol   Knee discomfort - Mild intermittent knee discomfort - Symptoms controlled with current regimen of meloxicam   Mobility concerns - Planning a trip to [place] in April - Considering a travel scooter to maintain mobility during prolonged walking and standing  10/10/2024 cervical spine MRI FINDINGS 1. Prior ACDF at C7-T1 without residual spinal stenosis. 2. Multilevel cervical spondylosis with resultant mild diffuse spinal stenosis at C4-5 through C6-7. 3. Multifactorial degenerative changes with resultant multilevel foraminal narrowing as above. Notable findings include severe left with moderate right C5 foraminal stenosis, severe bilateral C6 and C7 foraminal narrowing, with mild right C8 foraminal stenosis. 4. Prominent degenerative osteoarthritic changes with reactive marrow edema about the C1-2 articulations at the skull base. Finding could serve as a source for neck pain.    Objective:   Vitals:   10/28/24 1403  BP: (!) 130/90    Const: appears well, non-toxic, well groomed Psych: affect bright, interactive, smiling EENT: EOMI intact, conjunctiva appear  normal Neck: no obvious masses, appears symmetric Resp: non-labored, appears symmetric Neuro: muscle bulk appears normal Skin: no obvious rashes noted   Shani's cervical spine exam was deferred today     Assessment & Plan:   Assessment & Plan Cervical spondylosis with neck pain   Current management with meloxicam  is effective, with occasional tramadol  for additional relief. She prefers to avoid invasive procedures for now given her improvement. Continue meloxicam  for anti-inflammatory effect and use tramadol  as needed. Consider physical therapy targeting the neck for traction and other modalities. Avoid invasive procedures at C1-C2 for now.  Can consider physical therapy focusing on neck more +/- C1-2 intra-articular steroid injection via fluoroscopy with interventional radiology if pain persists.  Chronic low back pain   Symptoms are well-managed with the current medication regimen. No new interventions are required. Continue current pain management regimen.  Bilateral knee pain   Well-controlled with meloxicam , with occasional mild discomfort managed effectively. She declines knee nerve block as symptoms are not significant. Continue meloxicam  for knee pain management.

## 2024-11-17 ENCOUNTER — Other Ambulatory Visit: Payer: Self-pay | Admitting: Cardiology

## 2024-12-09 ENCOUNTER — Encounter (HOSPITAL_BASED_OUTPATIENT_CLINIC_OR_DEPARTMENT_OTHER): Attending: General Surgery | Admitting: General Surgery

## 2024-12-09 DIAGNOSIS — L97829 Non-pressure chronic ulcer of other part of left lower leg with unspecified severity: Secondary | ICD-10-CM | POA: Insufficient documentation

## 2024-12-09 DIAGNOSIS — I5032 Chronic diastolic (congestive) heart failure: Secondary | ICD-10-CM | POA: Diagnosis not present

## 2024-12-09 DIAGNOSIS — I872 Venous insufficiency (chronic) (peripheral): Secondary | ICD-10-CM | POA: Diagnosis not present

## 2024-12-16 ENCOUNTER — Encounter (HOSPITAL_BASED_OUTPATIENT_CLINIC_OR_DEPARTMENT_OTHER): Admitting: General Surgery

## 2024-12-16 DIAGNOSIS — L97829 Non-pressure chronic ulcer of other part of left lower leg with unspecified severity: Secondary | ICD-10-CM | POA: Diagnosis not present

## 2024-12-26 ENCOUNTER — Encounter (HOSPITAL_BASED_OUTPATIENT_CLINIC_OR_DEPARTMENT_OTHER): Admitting: General Surgery

## 2024-12-26 DIAGNOSIS — L97829 Non-pressure chronic ulcer of other part of left lower leg with unspecified severity: Secondary | ICD-10-CM | POA: Diagnosis not present

## 2024-12-30 ENCOUNTER — Encounter (HOSPITAL_BASED_OUTPATIENT_CLINIC_OR_DEPARTMENT_OTHER): Admitting: General Surgery

## 2025-01-06 ENCOUNTER — Encounter (HOSPITAL_BASED_OUTPATIENT_CLINIC_OR_DEPARTMENT_OTHER): Admitting: General Surgery

## 2025-01-13 ENCOUNTER — Encounter (HOSPITAL_BASED_OUTPATIENT_CLINIC_OR_DEPARTMENT_OTHER): Admitting: General Surgery

## 2025-04-14 ENCOUNTER — Ambulatory Visit: Admitting: Family Medicine
